# Patient Record
Sex: Male | Born: 1965 | Race: Black or African American | Hispanic: No | Marital: Single | Smoking: Never smoker
Health system: Southern US, Community
[De-identification: ages and names within clinical notes are randomized; demographics above are authoritative.]

## PROBLEM LIST (undated history)

## (undated) DIAGNOSIS — Z9889 Other specified postprocedural states: Secondary | ICD-10-CM

## (undated) DIAGNOSIS — J189 Pneumonia, unspecified organism: Secondary | ICD-10-CM

## (undated) DIAGNOSIS — I5041 Acute combined systolic (congestive) and diastolic (congestive) heart failure: Secondary | ICD-10-CM

## (undated) DIAGNOSIS — N186 End stage renal disease: Secondary | ICD-10-CM

## (undated) DIAGNOSIS — I82629 Acute embolism and thrombosis of deep veins of unspecified upper extremity: Secondary | ICD-10-CM

## (undated) DIAGNOSIS — I739 Peripheral vascular disease, unspecified: Secondary | ICD-10-CM

## (undated) DIAGNOSIS — Z992 Dependence on renal dialysis: Secondary | ICD-10-CM

## (undated) DIAGNOSIS — D735 Infarction of spleen: Secondary | ICD-10-CM

## (undated) DIAGNOSIS — I10 Essential (primary) hypertension: Secondary | ICD-10-CM

## (undated) DIAGNOSIS — I340 Nonrheumatic mitral (valve) insufficiency: Secondary | ICD-10-CM

## (undated) DIAGNOSIS — I38 Endocarditis, valve unspecified: Secondary | ICD-10-CM

## (undated) DIAGNOSIS — T826XXA Infection and inflammatory reaction due to cardiac valve prosthesis, initial encounter: Secondary | ICD-10-CM

## (undated) DIAGNOSIS — IMO0001 Reserved for inherently not codable concepts without codable children: Secondary | ICD-10-CM

## (undated) DIAGNOSIS — I5032 Chronic diastolic (congestive) heart failure: Secondary | ICD-10-CM

## (undated) DIAGNOSIS — I330 Acute and subacute infective endocarditis: Secondary | ICD-10-CM

## (undated) DIAGNOSIS — I76 Septic arterial embolism: Secondary | ICD-10-CM

## (undated) HISTORY — PX: AV FISTULA PLACEMENT: SHX1204

## (undated) HISTORY — DX: Infection and inflammatory reaction due to cardiac valve prosthesis, initial encounter: T82.6XXA

## (undated) HISTORY — DX: Endocarditis, valve unspecified: I38

## (undated) HISTORY — DX: Peripheral vascular disease, unspecified: I73.9

---

## 2002-09-22 ENCOUNTER — Encounter: Admission: RE | Admit: 2002-09-22 | Discharge: 2002-12-21 | Payer: Self-pay | Admitting: Family Medicine

## 2002-10-18 ENCOUNTER — Ambulatory Visit (HOSPITAL_COMMUNITY): Admission: RE | Admit: 2002-10-18 | Discharge: 2002-10-18 | Payer: Self-pay | Admitting: Family Medicine

## 2002-10-18 ENCOUNTER — Encounter: Payer: Self-pay | Admitting: Family Medicine

## 2003-11-29 ENCOUNTER — Inpatient Hospital Stay (HOSPITAL_COMMUNITY): Admission: EM | Admit: 2003-11-29 | Discharge: 2003-12-02 | Payer: Self-pay | Admitting: *Deleted

## 2003-12-01 ENCOUNTER — Encounter: Payer: Self-pay | Admitting: Cardiology

## 2004-01-16 ENCOUNTER — Ambulatory Visit: Admission: RE | Admit: 2004-01-16 | Discharge: 2004-01-16 | Payer: Self-pay | Admitting: Vascular Surgery

## 2004-01-25 ENCOUNTER — Ambulatory Visit (HOSPITAL_COMMUNITY): Admission: RE | Admit: 2004-01-25 | Discharge: 2004-01-25 | Payer: Self-pay | Admitting: Vascular Surgery

## 2004-02-24 ENCOUNTER — Ambulatory Visit (HOSPITAL_COMMUNITY): Admission: RE | Admit: 2004-02-24 | Discharge: 2004-02-24 | Payer: Self-pay | Admitting: Vascular Surgery

## 2004-02-29 ENCOUNTER — Ambulatory Visit (HOSPITAL_COMMUNITY): Admission: RE | Admit: 2004-02-29 | Discharge: 2004-02-29 | Payer: Self-pay | Admitting: Nephrology

## 2004-04-27 ENCOUNTER — Ambulatory Visit (HOSPITAL_COMMUNITY): Admission: RE | Admit: 2004-04-27 | Discharge: 2004-04-27 | Payer: Self-pay | Admitting: Vascular Surgery

## 2004-07-30 ENCOUNTER — Ambulatory Visit (HOSPITAL_COMMUNITY): Admission: RE | Admit: 2004-07-30 | Discharge: 2004-07-30 | Payer: Self-pay | Admitting: Nephrology

## 2008-04-10 ENCOUNTER — Emergency Department (HOSPITAL_COMMUNITY): Admission: EM | Admit: 2008-04-10 | Discharge: 2008-04-10 | Payer: Self-pay | Admitting: Emergency Medicine

## 2009-10-16 ENCOUNTER — Ambulatory Visit (HOSPITAL_COMMUNITY)
Admission: RE | Admit: 2009-10-16 | Discharge: 2009-10-16 | Payer: Self-pay | Source: Home / Self Care | Admitting: Nephrology

## 2010-11-23 NOTE — Op Note (Signed)
NAME:  BLAYN, SCARCELLA NO.:  [id number]   MEDICAL RECORD NO.:  [id number]                   PATIENT TYPE:  INP   LOCATION:  3710                                 FACILITY:  [facility]   PHYSICIAN:  Nelda Severe. Kellie Simmering, M.D.               DATE OF BIRTH:  [birth year]   DATE OF PROCEDURE:  11/30/2003  DATE OF DISCHARGE:                                 OPERATIVE REPORT   PREOPERATIVE DIAGNOSIS:  End-stage renal disease.   POSTOPERATIVE DIAGNOSIS:  End-stage renal disease.   OPERATION PERFORMED:  1. Bilateral ultrasound localization of internal jugular veins.  2. Insertion of Diatek catheter right internal jugular vein (24 cm).   SURGEON:  Akeim Refuerzo. Kellie Simmering, M.D.   ASSISTANT:  Nurse.   ANESTHESIA:  Local.   DESCRIPTION OF PROCEDURE:  The patient was taken to the operating room and  placed in Trendelenburg position at which time both internal jugular veins  were visualized using B-mode ultrasound and both noted to be patent and  normal in appearance.  After prepping and draping in routine sterile manner,  the right internal jugular vein was entered using the supraclavicular  approach, guidewire passed into the right atrium.  After dilating the tract  appropriately, the catheter was passed through a peel-away sheath and  positioned in the right atrium under fluoroscopic guidance.  The catheter  was then tunneled peripherally and secured with nylon sutures.  Both ports  easily flushed with heparin saline.  Catheter was secured with nylon sutures  and the wound closed with Vicryl in a subcuticular fashion.  Sterile  dressing applied and the patient taken to recovery room in satisfactory  condition.                                               Nelda Severe Kellie Simmering, M.D.    JDL/MEDQ  D:  11/30/2003  T:  12/01/2003  Job:  [id number]

## 2010-11-23 NOTE — Discharge Summary (Signed)
NAME:  Timothy Palmer, Timothy Palmer                          ACCOUNT NO.:  [id number]   MEDICAL RECORD NO.:  [id number]                   PATIENT TYPE:  INP   LOCATION:  3710                                 FACILITY:  [facility]   PHYSICIAN:  Caren Griffins B. Lorrene Reid, M.D.             DATE OF BIRTH:  1965-09-05   DATE OF ADMISSION:  11/29/2003  DATE OF DISCHARGE:  12/02/2003                                 DISCHARGE SUMMARY   ADMITTING DIAGNOSES:  1. Uncontrolled hypertension with evidence for multi end-organ damage.  2. Renal failure.  3. Anemia.  4. Abnormal electrocardiogram felt secondary to uncontrolled hypertension.   DISCHARGE DIAGNOSES:  1. Uncontrolled hypertension, now controlled.  2. New end-stage renal disease, now on chronic hemodialysis.  3. Status post right internal jugular Diatek catheter placement, Dr. Kellie Simmering,     Nov 30, 2003.  4. Iron deficiency anemia.  5. Secondary hyperparathyroidism.  6. Negative for acute myocardial infarction.   BRIEF HISTORY:  A 45 year old black male who has at least a one-year history  of hypertension.  Was evaluated in 2004 by Dr. Maylon Peppers and received  medication and was told that he had proteinuria.  In the [facility] computer there  is a report of an MRA of the abdomen which demonstrated three right renal  arteries that showed some irregularity and a single left renal artery which  was widely patent.  Kidney scans at that time were normal with the right one  measuring 11.3 cm and the left one measuring 12.1 cm and no hydronephrosis.   He stopped taking his medications and never followed up with his physicians.  Approximately three weeks ago he developed early morning nausea, vomiting  and a 25- to 30-pound weight loss with anorexia.  He also developed  exertional dyspnea when he walked less than a block.  He has had sleep  disturbance with extreme daytime fatigue, lethargy and some epigastric  abdominal pain.  He presents to emergency room with initial blood  pressure  of 220/160, which came down to 190/129 after a dose of oral clonidine.  His  laboratory was remarkable for a BUN of 116 and creatinine of 19.2, having  been approximately 2 in March of 2004.  His hemoglobin was 9.8, and he was  admitted for management of his renal failure and hypertension.   ADMISSION LABORATORY DATA:  Sodium 129, potassium 3.5, chloride 95, CO2 15,  BUN 116, creatinine 19.2, glucose 137, calcium 8.7, total protein 7.6,  albumin 3.8, hemoglobin 9.8, hematocrit 28.3, platelets 107,000.  White  cells shows 70% polys, 20% lymphs.  Amylase 220, lipase 99.  EKG shows  normal sinus rhythm, left atrial enlargement, incomplete right bundle branch  block, left ventricular hypertrophy with strain.   HOSPITAL COURSE:  The patient was admitted and placed on 10 mg of Norvasc  and 400 mg of labetalol b.i.d.  Echocardiogram of his heart showed overall  left ventricular systolic  function to be normal with an ejection fraction  estimated at 55-65% with mild hypokinesis of the anteroseptal wall.  A  ventricular wall thickness was moderately to markedly increased.  The left  atrial size was at the upper limits of normal.  Otherwise the study was  unremarkable.  Preparations were made for initiation of dialysis.  He viewed  the dialysis educational films.  He met with the dietician who left him  literature.  Dr. Kellie Simmering placed a right IJ Diatek catheter on Nov 30, 2003,  and he underwent his first dialysis following that surgery.  He dialyzed the  second time on Dec 01, 2003.  Outpatient arrangements were made at the [facility].  It will be Tuesday, Thursday, Saturday at 11:45  a.m.  Further workup included an intact PTH of 476 for which calcitriol was  started at 0.5 mcg IV each dialysis.  He was also placed on phosphate  binders using calcium carbonate 500 mg 2 with each meal for a phosphorous  that was 10 on admission.  We will repeat a PTH next month at the  [facility].  His hemoglobin was 9.8 with a transferrin saturation level of 22%.  For this, he will receive InFeD protocol at the [facility].  His EPO was  initially 5000 units and then increased to 10,000 units IV each dialysis.  His hepatitis B surface antigen was negative.  His dry weight is estimated  at 95 kg.  He is a good transplant candidate.  We will pursue this as an  outpatient.  Reportedly the patient has approximately 10 siblings.   Mr. Chapla has an appointment with Dr. Oneida Alar December 16, 2003, Friday, at  12:30 p.m. for evaluation of permanent access placement in his left arm.  He  did have vein mapping done while here in the hospital.   DISCHARGE MEDICATIONS:  1. Nephro-Vite vitamin 1 daily.  2. Calcium carbonate 500 mg 2 with meals.  3. Norvasc 10 mg q.p.m.  4. Labetalol 400 mg b.i.d., hold before dialysis.  5. EPO 10,000 unit IV each dialysis.  6. Calcitriol to be increased to 1.0 mcg IV each dialysis after three doses     of 0.5 mcg IV each dialysis.  7. InFeD protocol, including the test dose to be done at the [facility]     and then decreased to 50 mg IV weekly.  His dry weight is estimated at 95     kg.      Nonah Mattes, P.A.                      Elzie Rings Lorrene Reid, M.D.    RRK/MEDQ  D:  12/02/2003  T:  12/04/2003  Job:  [id number]   cc:   [place], MD  [place], [place] [address]   Candace Gallus, M.D.  [address]. [place] [address]  Fax: [phone]

## 2010-11-23 NOTE — H&P (Signed)
NAME:  WISSAM, GIGLIO NO.:  [id number]   MEDICAL RECORD NO.:  [id number]                   PATIENT TYPE:  INP   LOCATION:  [place]                                 FACILITY:  [facility]   PHYSICIAN:  Caren Griffins B. Lorrene Reid, M.D.             DATE OF BIRTH:  12/26/1965   DATE OF ADMISSION:  11/29/2003  DATE OF DISCHARGE:                                HISTORY & PHYSICAL   REASON FOR ADMISSION:  Severe hypertension and renal failure.   HISTORY OF PRESENT ILLNESS:  Mr. Blissett is a very nice 45 year old black  male with at least a one-year history of hypertension.  He was evaluated  sometime in 2004 by Dr. Maylon Peppers.  All the details of that workup are not  available but he was treated with medication for his hypertension and told  that he had proteinuria.  In the [facility] computer, there is report of an MRA of  the abdomen which demonstrated three right renal arteries that showed some  irregularity raising the question of possible FMD and a single left renal  artery which was widely patent.  Kidney sizes at that time were normal  measuring 11.3 cm on the right and 12.1 on the left with no hydronephrosis.   The patient stopped taking his medications and never followed up with his  physician.  About three weeks ago, he developed early-morning nausea and  vomiting, a 25-30 pound weight loss, and anorexia.  He has developed  exertional dyspnea when he walks less than a block.  He has had a sleep  disturbance with extreme daytime fatigue, lethargy, and some epigastric  abdominal pain.   He presented to the emergency room tonight with an initial blood pressure of  220/160 which came down to 190/129 after some oral clonidine.  His labs were  remarkable for a BUN of 116, a creatinine of 19.2, and a hemoglobin of 9.8.  He is therefore admitted for further evaluation and management of his renal  failure and hypertension.   PAST MEDICAL HISTORY:  Limited to hypertension on no  treatment for over a  year and proteinuria which apparently was never worked up due to the  patient's failure to followup.   He has no known drug allergies and takes no medications except Tylenol.   FAMILY HISTORY:  Positive for hypertension in his sister, lupus in his  mother who is now deceased, and cancer in his father who is deceased.  There  is no history of renal failure.  He does have a total of 10 living siblings.  One is deceased secondary to a homicide.   SOCIAL HISTORY:  He is divorced from his wife, has three sons.  He has a  high school education.  He works as a Freight forwarder at [employer].  He previously drank beer on the weekends but has had no alcohol for about  three months.  He does  not use tobacco.  No street drugs and has no risk  factors for HIV.   REVIEW OF SYSTEMS:  Positive for headaches, blurred vision, early morning  nausea, vomiting, anorexia, a 25-pound weight loss, epigastric abdominal  pain, leg cramping and cramps in his back.  He denies dysuria, hematuria, or  voiding problems but does report nocturia x2-3.  He denies edema.   PHYSICAL EXAMINATION:  GENERAL:  He is a very nice, soft spoken, young  gentleman.  VITAL SIGNS:  Blood pressure 190/126, heart rate 96, he is afebrile.  HEENT:  Fundi show severe arteriolar narrowing with AV nicking but no  papilledema.  NECK:  He had no JVD and no carotid bruits.  LUNGS:  Clear.  CARDIOVASCULAR:  Precordium was prominent.  PMI laterally displaced and  quite forceful.  S1, S2, positive S4, question soft S3.  There was no  pericardial rub.  ABDOMEN:  Obese.  Bowel sounds were present.  There were no abdominal  bruits.  He was nontender to deep palpation.  There was no palpable  organomegaly.  EXTREMITIES:  Femoral pulses were 2+ and symmetric without bruits.  Extremities showed 1+ edema.  Dorsalis pedis and posterior tibial pulses  were symmetric and 1-2+.   LABORATORY DATA:  Sodium 129, potassium  3.5, chloride 95, CO2 15, BUN 116,  creatinine 19.2, glucose 137, calcium 8.7, total protein 7.6, albumin 3.9.  Hemoglobin 9.8, hematocrit 28.3, platelets 107,000.  White cell  differential:  70% polys and 20% lymphs.  Amylase 220, lipase 99.   Chest x-ray is pending.  Ultrasound is pending.  Electrocardiogram showed  normal sinus rhythm, left atrial enlargement, incomplete right bundle branch  block, LVH was strained and it was not possible to exclude inferolateral  ischemia.   IMPRESSION:  A 45 year old black male with:  1. Uncontrolled hypertension with evidence for multi end-organ damage.     Whether or not his hypertension was all primary or whether secondary to     some underlying renal disease (history of proteinuria) or due to FMD is     uncertain at this point.  2. Renal failure.  I suspect that this is end-stage disease at this point.     His labs and symptom complex suggests chronicity.  He does have     indications for dialysis but not urgent this evening.  Whether this is     all related to uncontrolled hypertension or some underlying glomerular     disease associated with hypertension is unclear as mentioned in #1;     however, at this point I suspect little in the way of reversibility.  3. Anemia, likely secondary to #2.  4. Abnormal electrocardiogram, likely secondary to #1.   PLANS:  1. Control hypertension--he has received clonidine in the emergency room as     well as IV labetalol with some response.  We will start p.o. labetalol     and p.o. Norvasc and use IV labetalol as needed.  2. Obtain a renal ultrasound to reevaluate kidney size and symmetry and     obtain SPEP and UPEP for completeness.  His mother had lupus but he has     nothing to suggest lupus-related renal disease at this point.  I have     called Dr. Amedeo Plenty for catheter placement for dialysis.  We will order vein    mapping and save his left arm, show him dialysis videos, and begin     chronic kidney  disease education.  We  will need to check a biointact PTH     as well as phosphorous and initiate binders and/or Calcitriol if     appropriate.  3. Obtain iron studies and initiate erythropoietin with his dialysis.  4. A 2-D echocardiogram to look at wall motion, evaluate for severity of     left ventricular hypertrophy, and look at left ventricular function.                                                Elzie Rings Lorrene Reid, M.D.    CBD/MEDQ  D:  11/29/2003  T:  11/30/2003  Job:  [id number]

## 2010-11-23 NOTE — Op Note (Signed)
NAME:  Timothy Palmer, Timothy Palmer NO.:  [id number]   MEDICAL RECORD NO.:  [id number]                   PATIENT TYPE:  OIB   LOCATION:  2899                                 FACILITY:  [facility]   PHYSICIAN:  Nelda Severe. Kellie Simmering, M.D.               DATE OF BIRTH:  [birth year]   DATE OF PROCEDURE:  DATE OF DISCHARGE:  01/25/2004                                 OPERATIVE REPORT   DATE OF OPERATION:  January 25, 2004.   PREOPERATIVE DIAGNOSIS:  End-stage renal disease.   POSTOPERATIVE DIAGNOSIS:  End-stage renal disease.   OPERATION:  Creation of a left radial artery to cephalic vein AV fistula  (Cimino shunt).   SURGEON:  Holdon Cordner. Kellie Simmering, MD.   FIRST ASSISTANT:  Nurse.   ANESTHESIA:  Local.   PROCEDURE:  The patient was taken to the operating room, placed in the  supine position, at which time the left upper extremity was prepped with  Betadine scrub and solution, and draped in the routine sterile manner. After  infiltration with 1% Xylocaine with epinephrine, a longitudinal incision was  made just proximal to the wrist between the radial artery and cephalic vein.  The cephalic vein was dissected free, ligated distally, and transected.  It  was gently dilated with heparinized saline and was a good vein up to the  antecubital area.  It was satisfactory for a fistula.  It was about 3 mm in  size.  The radial artery was then exposed beneath the fascia, encircled with  vessel loops, and 3000 units of heparin given intravenously.  The artery was  then opened longitudinally with a #15 blade, extended with a Potts scissors.  It would accept a 2.5 mm dilator proximally and distally.  The vein was  carefully measured and spatulated, and anastomosed end-to-side with 6-0  Prolene.  The clamp was then released, and there was a good pulse and thrill  up to the antecubital area.  No Protamine was given.  The wound was  irrigated with saline, closed in layers with Vicryl in a  subcuticular  fashion, sterile dressing applied, and the patient taken to the recovery  room in satisfactory condition.                                               Nelda Severe Kellie Simmering, M.D.    JDL/MEDQ  D:  01/25/2004  T:  01/25/2004  Job:  [id number]

## 2011-04-08 LAB — PATHOLOGIST SMEAR REVIEW

## 2011-04-08 LAB — POCT I-STAT, CHEM 8
Creatinine, Ser: 8.2 — ABNORMAL HIGH
Hemoglobin: 11.6 — ABNORMAL LOW
Potassium: 3.1 — ABNORMAL LOW
TCO2: 25

## 2011-04-08 LAB — BASIC METABOLIC PANEL
BUN: 29 — ABNORMAL HIGH
Calcium: 9
Chloride: 100
Creatinine, Ser: 8.78 — ABNORMAL HIGH
GFR calc Af Amer: 8 — ABNORMAL LOW
Potassium: 3.1 — ABNORMAL LOW

## 2011-04-08 LAB — DIFFERENTIAL
Basophils Absolute: 0
Basophils Relative: 0
Lymphocytes Relative: 45
Lymphs Abs: 6.4 — ABNORMAL HIGH
Neutrophils Relative %: 49

## 2011-04-08 LAB — CBC
HCT: 33.3 — ABNORMAL LOW
MCHC: 33.1
RDW: 15.4

## 2011-10-10 ENCOUNTER — Other Ambulatory Visit (HOSPITAL_COMMUNITY): Payer: Self-pay | Admitting: Nephrology

## 2011-10-10 DIAGNOSIS — N186 End stage renal disease: Secondary | ICD-10-CM

## 2011-10-11 ENCOUNTER — Ambulatory Visit (HOSPITAL_COMMUNITY): Payer: Self-pay

## 2011-10-14 ENCOUNTER — Ambulatory Visit (HOSPITAL_COMMUNITY): Admission: RE | Admit: 2011-10-14 | Payer: Self-pay | Source: Ambulatory Visit

## 2011-10-29 ENCOUNTER — Other Ambulatory Visit (HOSPITAL_COMMUNITY): Payer: Self-pay | Admitting: Nephrology

## 2011-10-29 DIAGNOSIS — N186 End stage renal disease: Secondary | ICD-10-CM

## 2011-11-01 ENCOUNTER — Ambulatory Visit (HOSPITAL_COMMUNITY): Payer: Self-pay

## 2011-11-04 ENCOUNTER — Ambulatory Visit (HOSPITAL_COMMUNITY)
Admission: RE | Admit: 2011-11-04 | Discharge: 2011-11-04 | Disposition: A | Discharge status: Home / Self Care | Payer: Medicare Other | Source: Ambulatory Visit | Attending: Nephrology | Admitting: Nephrology

## 2011-11-04 DIAGNOSIS — Y849 Medical procedure, unspecified as the cause of abnormal reaction of the patient, or of later complication, without mention of misadventure at the time of the procedure: Secondary | ICD-10-CM | POA: Insufficient documentation

## 2011-11-04 DIAGNOSIS — N186 End stage renal disease: Secondary | ICD-10-CM

## 2011-11-04 DIAGNOSIS — T82898A Other specified complication of vascular prosthetic devices, implants and grafts, initial encounter: Secondary | ICD-10-CM | POA: Insufficient documentation

## 2011-11-04 HISTORY — PX: SHUNTOGRAM: SHX6095

## 2011-11-04 MED ORDER — IOHEXOL 300 MG/ML  SOLN
100.0000 mL | Freq: Once | INTRAMUSCULAR | Status: AC | PRN
  Administered 2011-11-04: 50 mL via INTRAVENOUS

## 2011-11-04 NOTE — Procedures (Signed)
Procedure:  Left fistulogram Findings:  Stable patency.  No intervention necessary.

## 2011-11-05 ENCOUNTER — Telehealth (HOSPITAL_COMMUNITY): Payer: Self-pay | Admitting: *Deleted

## 2011-11-05 NOTE — Telephone Encounter (Signed)
Post procedure follow up call. VM left for pt

## 2012-07-08 DIAGNOSIS — J189 Pneumonia, unspecified organism: Secondary | ICD-10-CM

## 2012-07-08 HISTORY — DX: Pneumonia, unspecified organism: J18.9

## 2012-08-13 ENCOUNTER — Other Ambulatory Visit (HOSPITAL_COMMUNITY): Payer: Self-pay | Admitting: Nephrology

## 2012-08-13 DIAGNOSIS — N186 End stage renal disease: Secondary | ICD-10-CM

## 2012-08-14 ENCOUNTER — Ambulatory Visit (HOSPITAL_COMMUNITY): Admission: RE | Admit: 2012-08-14 | Payer: Medicare Other | Source: Ambulatory Visit

## 2012-08-17 ENCOUNTER — Encounter (HOSPITAL_COMMUNITY): Payer: Self-pay

## 2012-08-17 ENCOUNTER — Other Ambulatory Visit (HOSPITAL_COMMUNITY): Payer: Self-pay | Admitting: Nephrology

## 2012-08-17 ENCOUNTER — Ambulatory Visit (HOSPITAL_COMMUNITY)
Admission: RE | Admit: 2012-08-17 | Discharge: 2012-08-17 | Disposition: A | Discharge status: Home / Self Care | Payer: Medicare Other | Source: Ambulatory Visit | Attending: Nephrology | Admitting: Nephrology

## 2012-08-17 DIAGNOSIS — I70209 Unspecified atherosclerosis of native arteries of extremities, unspecified extremity: Secondary | ICD-10-CM | POA: Insufficient documentation

## 2012-08-17 DIAGNOSIS — T82898A Other specified complication of vascular prosthetic devices, implants and grafts, initial encounter: Secondary | ICD-10-CM | POA: Insufficient documentation

## 2012-08-17 DIAGNOSIS — N186 End stage renal disease: Secondary | ICD-10-CM

## 2012-08-17 DIAGNOSIS — Y832 Surgical operation with anastomosis, bypass or graft as the cause of abnormal reaction of the patient, or of later complication, without mention of misadventure at the time of the procedure: Secondary | ICD-10-CM | POA: Insufficient documentation

## 2012-08-17 DIAGNOSIS — I871 Compression of vein: Secondary | ICD-10-CM | POA: Insufficient documentation

## 2012-08-17 DIAGNOSIS — Z992 Dependence on renal dialysis: Secondary | ICD-10-CM | POA: Insufficient documentation

## 2012-08-17 MED ORDER — IOHEXOL 300 MG/ML  SOLN
100.0000 mL | Freq: Once | INTRAMUSCULAR | Status: AC | PRN
  Administered 2012-08-17: 60 mL via INTRAVENOUS

## 2012-08-17 NOTE — H&P (Signed)
  HPI: Timothy Palmer is an 47 y.o. male with ESRD who has been having trouble with access flows during his HD sessions. He is referred for a fistulogram, which is now noted to be positive for a stenosis. PMHx and meds reviewed.  Past Medical History: ESRD  Past Surgical History: (L)forearm AVF  Allergies: No Known Allergies  Medications: Nephrovite, Levitra, Pravastatin, Lisinopril  Please HPI for pertinent positives, otherwise complete 10 system ROS negative.  Physical Exam: There were no vitals taken for this visit. There is no height or weight on file to calculate BMI.   General Appearance:  Alert, cooperative, no distress, appears stated age  Head:  Normocephalic, without obvious abnormality, atraumatic  ENT: Unremarkable  Lungs:   Clear to auscultation bilaterally, no w/r/r, respirations unlabored without use of accessory muscles.  Heart:  Regular rate and rhythm, S1, S2 normal, no murmur, rub or gallop. Carotids 2+ without bruit.  Extremities: (L)forearm AVF currently accessed for shuntogram. Hand warm  Neurologic: Normal affect, no gross deficits.   No results found for this or any previous visit (from the past 48 hour(s)). No results found.  Assessment/Plan ESRD Stenosis of (L)UE AVF For completion shuntogram and angioplasty Discussed procedure with pt, including risks and complications. Consent signed  Ascencion Dike PA-C 08/17/2012, 3:27 PM

## 2012-08-18 ENCOUNTER — Telehealth (HOSPITAL_COMMUNITY): Payer: Self-pay | Admitting: *Deleted

## 2012-09-12 ENCOUNTER — Emergency Department (HOSPITAL_COMMUNITY): Payer: Medicare Other

## 2012-09-12 ENCOUNTER — Encounter (HOSPITAL_COMMUNITY): Payer: Self-pay | Admitting: Emergency Medicine

## 2012-09-12 ENCOUNTER — Emergency Department (HOSPITAL_COMMUNITY)
Admission: EM | Admit: 2012-09-12 | Discharge: 2012-09-12 | Disposition: A | Discharge status: Home / Self Care | Payer: Medicare Other | Attending: Emergency Medicine | Admitting: Emergency Medicine

## 2012-09-12 DIAGNOSIS — J189 Pneumonia, unspecified organism: Secondary | ICD-10-CM

## 2012-09-12 DIAGNOSIS — N186 End stage renal disease: Secondary | ICD-10-CM | POA: Insufficient documentation

## 2012-09-12 DIAGNOSIS — I120 Hypertensive chronic kidney disease with stage 5 chronic kidney disease or end stage renal disease: Secondary | ICD-10-CM | POA: Insufficient documentation

## 2012-09-12 DIAGNOSIS — M545 Low back pain, unspecified: Secondary | ICD-10-CM | POA: Insufficient documentation

## 2012-09-12 DIAGNOSIS — J159 Unspecified bacterial pneumonia: Secondary | ICD-10-CM | POA: Insufficient documentation

## 2012-09-12 DIAGNOSIS — Z79899 Other long term (current) drug therapy: Secondary | ICD-10-CM | POA: Insufficient documentation

## 2012-09-12 DIAGNOSIS — Z992 Dependence on renal dialysis: Secondary | ICD-10-CM | POA: Insufficient documentation

## 2012-09-12 HISTORY — DX: Essential (primary) hypertension: I10

## 2012-09-12 LAB — CBC WITH DIFFERENTIAL/PLATELET
Basophils Absolute: 0 10*3/uL (ref 0.0–0.1)
Basophils Relative: 0 % (ref 0–1)
Eosinophils Absolute: 0.1 10*3/uL (ref 0.0–0.7)
Eosinophils Relative: 1 % (ref 0–5)
HCT: 33.6 % — ABNORMAL LOW (ref 39.0–52.0)
Lymphocytes Relative: 21 % (ref 12–46)
Lymphs Abs: 2.4 10*3/uL (ref 0.7–4.0)
MCH: 33.1 pg (ref 26.0–34.0)
MCV: 99.4 fL (ref 78.0–100.0)
Neutro Abs: 8.1 10*3/uL — ABNORMAL HIGH (ref 1.7–7.7)
RDW: 14.2 % (ref 11.5–15.5)

## 2012-09-12 LAB — POCT I-STAT TROPONIN I

## 2012-09-12 LAB — COMPREHENSIVE METABOLIC PANEL
ALT: 14 U/L (ref 0–53)
AST: 14 U/L (ref 0–37)
Alkaline Phosphatase: 140 U/L — ABNORMAL HIGH (ref 39–117)
GFR calc non Af Amer: 5 mL/min — ABNORMAL LOW (ref >90)
Glucose, Bld: 126 mg/dL — ABNORMAL HIGH (ref 70–99)
Sodium: 137 mEq/L (ref 135–145)
Total Bilirubin: 0.6 mg/dL (ref 0.3–1.2)

## 2012-09-12 MED ORDER — MORPHINE SULFATE 4 MG/ML IJ SOLN
4.0000 mg | Freq: Once | INTRAMUSCULAR | Status: AC
  Administered 2012-09-12: 4 mg via INTRAVENOUS
  Filled 2012-09-12: qty 1

## 2012-09-12 MED ORDER — IOHEXOL 350 MG/ML SOLN
100.0000 mL | Freq: Once | INTRAVENOUS | Status: AC | PRN
  Administered 2012-09-12: 100 mL via INTRAVENOUS

## 2012-09-12 MED ORDER — MOXIFLOXACIN HCL 400 MG PO TABS: 400.0000 mg | ORAL_TABLET | Freq: Every day | ORAL | Status: DC

## 2012-09-12 MED ORDER — OXYCODONE-ACETAMINOPHEN 5-325 MG PO TABS: 2.0000 | ORAL_TABLET | ORAL | Status: DC | PRN

## 2012-09-12 MED ORDER — LEVOFLOXACIN 500 MG PO TABS
500.0000 mg | ORAL_TABLET | Freq: Once | ORAL | Status: AC
  Administered 2012-09-12: 500 mg via ORAL
  Filled 2012-09-12: qty 1

## 2012-09-12 NOTE — ED Notes (Signed)
Onset today shortness of breath and entire left side of back.  Pain currently 9/10 achy. Has dialysis t, th, and sat.  Completed full session today 6am-10am.

## 2012-09-12 NOTE — ED Provider Notes (Signed)
History     CSN: [id number]  Arrival date & time 09/12/12  1412   First MD Initiated Contact with Patient 09/12/12 1541      Chief Complaint  Patient presents with  . Shortness of Breath  . Back Pain    (Consider location/radiation/quality/duration/timing/severity/associated sxs/prior treatment) HPI Comments: Patient complains of left-sided back pain. He states she woke up at 4 AM this morning with sharp pain to his left shoulder blade radiating to his left mid and lower back. He states it's worse with inspiration and worse with movement. He denies any known injury. He's been feeling more short of breath since this pain started. He denies any leg pain or swelling. He denies any cough or chest congestion. Denies any fevers or chills. He denies a known history of blood clots. He states it's been worsening through today. He has a history of hypertension and end-stage renal disease related to hypertension. He has dialysis Tuesday Thursday Saturday and did complete his full dialysis session this morning.  Patient is a 47 y.o. male presenting with shortness of breath and back pain.  Shortness of Breath Associated symptoms: no abdominal pain, no chest pain, no cough, no diaphoresis, no fever, no headaches, no rash and no vomiting   Back Pain Associated symptoms: no abdominal pain, no chest pain, no fever, no headaches, no numbness and no weakness     Past Medical History  Diagnosis Date  . Renal disorder   . Hypertension     History reviewed. No pertinent past surgical history.  No family history on file.  History  Substance Use Topics  . Smoking status: Never Smoker   . Smokeless tobacco: Not on file  . Alcohol Use: Yes      Review of Systems  Constitutional: Negative for fever, chills, diaphoresis and fatigue.  HENT: Negative for congestion, rhinorrhea and sneezing.   Eyes: Negative.   Respiratory: Positive for shortness of breath. Negative for cough and chest tightness.    Cardiovascular: Negative for chest pain and leg swelling.  Gastrointestinal: Negative for nausea, vomiting, abdominal pain, diarrhea and blood in stool.  Genitourinary: Negative for frequency, hematuria, flank pain and difficulty urinating.  Musculoskeletal: Positive for back pain. Negative for arthralgias.  Skin: Negative for rash.  Neurological: Negative for dizziness, speech difficulty, weakness, numbness and headaches.    Allergies  Review of patient's allergies indicates no known allergies.  Home Medications   Current Outpatient Rx  Name  Route  Sig  Dispense  Refill  . CINACALCET HCL PO   Oral   Take by mouth.         Marland Kitchen LISINOPRIL PO   Oral   Take by mouth.         . Sevelamer HCl (RENAGEL PO)   Oral   Take by mouth.         . moxifloxacin (AVELOX) 400 MG tablet   Oral   Take 1 tablet (400 mg total) by mouth daily.   10 tablet   0   . oxyCODONE-acetaminophen (PERCOCET) 5-325 MG per tablet   Oral   Take 2 tablets by mouth every 4 (four) hours as needed for pain.   15 tablet   0     BP 122/76  Pulse 101  Temp(Src) 98.2 F (36.8 C) (Oral)  Resp 26  Ht 5\' 11"  (1.803 m)  Wt 248 lb (112.492 kg)  BMI 34.6 kg/m2  SpO2 99%  Physical Exam  Constitutional: He is oriented to person, place,  and time. He appears well-developed and well-nourished.  HENT:  Head: Normocephalic and atraumatic.  Eyes: Pupils are equal, round, and reactive to light.  Neck: Normal range of motion. Neck supple.  Cardiovascular: Normal rate, regular rhythm and normal heart sounds.   Pulmonary/Chest: Effort normal and breath sounds normal. No respiratory distress. He has no wheezes. He has no rales. He exhibits no tenderness.  Abdominal: Soft. Bowel sounds are normal. There is no tenderness. There is no rebound and no guarding.  Musculoskeletal: Normal range of motion. He exhibits no edema.  No reproducible pain to the left upper back. No pedal edema. No pain on palpation of the  posterior calves  Lymphadenopathy:    He has no cervical adenopathy.  Neurological: He is alert and oriented to person, place, and time.  Skin: Skin is warm and dry. No rash noted.  Psychiatric: He has a normal mood and affect.    ED Course  Procedures (including critical care time)  Results for orders placed during the hospital encounter of 09/12/12  CBC WITH DIFFERENTIAL      Result Value Range   WBC 11.7 (*) 4.0 - 10.5 K/uL   RBC 3.38 (*) 4.22 - 5.81 MIL/uL   Hemoglobin 11.2 (*) 13.0 - 17.0 g/dL   HCT 33.6 (*) 39.0 - 52.0 %   MCV 99.4  78.0 - 100.0 fL   MCH 33.1  26.0 - 34.0 pg   MCHC 33.3  30.0 - 36.0 g/dL   RDW 14.2  11.5 - 15.5 %   Platelets 186  150 - 400 K/uL   Neutrophils Relative 69  43 - 77 %   Neutro Abs 8.1 (*) 1.7 - 7.7 K/uL   Lymphocytes Relative 21  12 - 46 %   Lymphs Abs 2.4  0.7 - 4.0 K/uL   Monocytes Relative 9  3 - 12 %   Monocytes Absolute 1.1 (*) 0.1 - 1.0 K/uL   Eosinophils Relative 1  0 - 5 %   Eosinophils Absolute 0.1  0.0 - 0.7 K/uL   Basophils Relative 0  0 - 1 %   Basophils Absolute 0.0  0.0 - 0.1 K/uL  COMPREHENSIVE METABOLIC PANEL      Result Value Range   Sodium 137  135 - 145 mEq/L   Potassium 5.0  3.5 - 5.1 mEq/L   Chloride 95 (*) 96 - 112 mEq/L   CO2 29  19 - 32 mEq/L   Glucose, Bld 126 (*) 70 - 99 mg/dL   BUN 28 (*) 6 - 23 mg/dL   Creatinine, Ser 10.33 (*) 0.50 - 1.35 mg/dL   Calcium 10.1  8.4 - 10.5 mg/dL   Total Protein 8.9 (*) 6.0 - 8.3 g/dL   Albumin 3.9  3.5 - 5.2 g/dL   AST 14  0 - 37 U/L   ALT 14  0 - 53 U/L   Alkaline Phosphatase 140 (*) 39 - 117 U/L   Total Bilirubin 0.6  0.3 - 1.2 mg/dL   GFR calc non Af Amer 5 (*) >90 mL/min   GFR calc Af Amer 6 (*) >90 mL/min  POCT I-STAT TROPONIN I      Result Value Range   Troponin i, poc 0.00  0.00 - 0.08 ng/mL   Comment 3            Dg Chest 2 View  09/12/2012  *RADIOLOGY REPORT*  Clinical Data: Shortness of breath, back pain, cough  CHEST - 2 VIEW  Comparison: None.  Findings:  Mild left lower lobe opacity, atelectasis versus pneumonia.  No pleural effusion or pneumothorax.  Cardiomegaly.  Visualized osseous structures are within normal limits.  IMPRESSION: Mild left lower lobe opacity, atelectasis versus pneumonia.   Original Report Authenticated By: Julian Hy, M.D.    Ct Angio Chest Pe W/cm &/or Wo Cm  09/12/2012  *RADIOLOGY REPORT*  Clinical Data: Left-sided posterior chest pain.  CT ANGIOGRAPHY CHEST  Technique:  Multidetector CT imaging of the chest using the standard protocol during bolus administration of intravenous contrast. Multiplanar reconstructed images including MIPs were obtained and reviewed to evaluate the vascular anatomy.  Contrast: 18mL OMNIPAQUE IOHEXOL 350 MG/ML SOLN  Comparison: CTA of the chest performed earlier today at 05:10 p.m.  Findings: There is no evidence of significant pulmonary embolus.  A trace left pleural effusion is noted.  Patchy bilateral airspace opacities raise concern for pneumonia, most prominent in the lower lung lobes bilaterally.  There is no evidence of pleural effusion or pneumothorax.  No masses are identified; no abnormal focal contrast enhancement is seen.  A dense pleural calcific plaque is again noted at the right lung base.  Mildly enlarged subcarinal and periaortic nodes are again seen, measuring up to 1.2 cm in short axis.  The mediastinum is otherwise unremarkable in appearance.  No pericardial effusion is identified. The great vessels are grossly unremarkable.  No axillary lymphadenopathy is seen.  The visualized portions of the thyroid gland are unremarkable in appearance.  The visualized portions of the liver and spleen are unremarkable.  No acute osseous abnormalities are seen.  IMPRESSION:  1.  No evidence of significant pulmonary embolus. 2.  Trace left pleural effusion; patchy bilateral airspace opacities raise concern for pneumonia, most prominent in the lower lung lobes bilaterally. 3.  Dense pleural calcific  plaque noted at the right lung base, raising question for prior asbestos exposure. 4.  Mildly enlarged mediastinal nodes, measuring up to 1.2 cm in short axis.   Original Report Authenticated By: Santa Lighter, M.D.    Ct Angio Chest Pe W/cm &/or Wo Cm  09/12/2012  *RADIOLOGY REPORT*  Clinical Data: Left-sided chest and back pain.  Shortness of breath.  Hypertension.  CT ANGIOGRAPHY CHEST  Technique:  Multidetector CT imaging of the chest using the standard protocol during bolus administration of intravenous contrast. Multiplanar reconstructed images including MIPs were obtained and reviewed to evaluate the vascular anatomy.  Contrast: 146mL OMNIPAQUE IOHEXOL 350 MG/ML SOLN  Comparison: Plain film of earlier today.  No prior CT.  Findings: Lung windows demonstrate multifactorial degradation. Addition to the patient's size, right arm is not raised above the head.  Mosaic attenuation, favored to be related to patchy areas of volume loss and low lung volumes.  Right base dependent atelectasis. Dependent patchy left lower lobe superior segment collapse / consolidative change.  Soft tissue windows:  The quality of this exam for evaluation of pulmonary embolism is poor to moderate.  In addition to the above described factors, the bolus is poorly timed, centered in the SVC. No central pulmonary embolism.  No lobar embolism.  To lung bases, no large segmental embolism identified.  Bilateral moderate gynecomastia.  Tortuous descending thoracic aorta.  Heart size upper normal.  Trace left pleural fluid.  Right- sided pleural calcification on image 57/series 3.  Borderline enlarged high right paratracheal node 1.0 cm on image 18/series 6. No hilar adenopathy.  Prevascular nodes which measure up to 1.2 cm. Moderate right hemidiaphragm elevation.  Limited abdominal imaging demonstrates no  significant findings.  No acute osseous abnormality.  IMPRESSION:  1.  Multifactorial degradation.  Poor to moderate evaluation for  pulmonary embolism.  No large/central embolism identified. 2.  Patchy superior segment left lower lobe airspace disease with small left pleural effusion.  Favored to represent infection. Small volume infarct could look similar.  If high clinical concern, consider repeat CT versus VQ scan. 3.  Mild thoracic adenopathy, favored to be reactive.  Consider follow-up with chest CT 3 - 6 months to confirm stability or resolution.  4.  Bilateral gynecomastia. 5.  Right-sided pleural calcification, suggesting prior empyema or hemothorax.   Original Report Authenticated By: Abigail Miyamoto, M.D.      Date: 09/12/2012  Rate: 105  Rhythm: sinus tachycardia  QRS Axis: right  Intervals: normal  ST/T Wave abnormalities: normal  Conduction Disutrbances:none  Narrative Interpretation:   Old EKG Reviewed: none available   1. Community acquired pneumonia       MDM  No evidence of PE, possible pneumonia, will tx with abx.  Pt well appearing, feel that outpatient treatment is appropriate.  Advised will need f/u for pleural plaques.  Pt is afebrile with mild cough and back pain.  Will CT findings, I feel that it is appropriate to treat for pneumonia, but given the pt's symptoms, I feel that it is most appropriate to tx for CAP vs HCAP at this time.  Advised f/u with his PMD.        Malvin Johns, MD 09/12/12 2150

## 2013-06-29 ENCOUNTER — Encounter: Payer: Self-pay | Admitting: Vascular Surgery

## 2013-06-30 ENCOUNTER — Ambulatory Visit: Payer: Medicare Other | Admitting: Vascular Surgery

## 2013-06-30 ENCOUNTER — Encounter: Payer: Self-pay | Admitting: Vascular Surgery

## 2013-06-30 ENCOUNTER — Ambulatory Visit (INDEPENDENT_AMBULATORY_CARE_PROVIDER_SITE_OTHER): Payer: Medicare Other | Admitting: Vascular Surgery

## 2013-06-30 VITALS — BP 145/99 | HR 89 | Resp 16 | Ht 71.5 in | Wt 241.0 lb

## 2013-06-30 DIAGNOSIS — N186 End stage renal disease: Secondary | ICD-10-CM

## 2013-06-30 NOTE — Progress Notes (Signed)
[facility]  Patient name: Timothy Palmer MRN: [id number] DOB: Apr 21, 1966 Sex: male  REASON FOR CONSULT: To evaluate for revision of proximal left radiocephalic AV fistula. Referred by Dr. Otelia Santee.  HPI: Timothy Palmer is a 47 y.o. male who had a left radiocephalic fistula placed approximately 10 years ago. He has been using this for dialysis. He dialyzes on Tuesdays Thursdays and Saturdays. He most recently had a fistulogram by Dr. Augustin Coupe on 06/23/2013. 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 He had a fistulogram by interventional radiology in February of 2014. In reviewing those films, it appears to me that the fistula is being filled by retrograde flow in the radial artery via the palmar arch. There was a stenosis in the proximal fistula which was ballooned. This required very high pressures.  On his most recent fistulogram on 06/23/2013, reportedly this showed a 70% stenosis in the radial artery feeding the fistula. It was not felt that this could be addressed with angioplasty and he was sent for evaluation for possible patch angioplasty of the proximal fistula and radial artery. I do not have these films to review. Patient denies any specific problems with the fistula or pain in his left arm.   Past Medical History  Diagnosis Date  . Renal disorder   . Hypertension   . Peripheral vascular disease    Family History  Problem Relation Age of Onset  . Diabetes Mother   . Hypertension Mother   . Hypertension Father   . Hypertension Sister   . Hypertension Brother    SOCIAL HISTORY: History  Substance Use Topics  . Smoking status: Never Smoker   . Smokeless tobacco: Never Used  . Alcohol Use: Yes   No Known Allergies Current Outpatient Prescriptions  Medication Sig Dispense Refill  . LISINOPRIL PO Take by mouth.      . moxifloxacin (AVELOX) 400 MG tablet Take 1 tablet (400 mg total) by mouth daily.  10  tablet  0  . CINACALCET HCL PO Take by mouth.      . oxyCODONE-acetaminophen (PERCOCET) 5-325 MG per tablet Take 2 tablets by mouth every 4 (four) hours as needed for pain.  15 tablet  0  . Sevelamer HCl (RENAGEL PO) Take by mouth.       No current facility-administered medications for this visit.   REVIEW OF SYSTEMS: Valu.Nieves ] denotes positive finding; [  ] denotes negative finding  CARDIOVASCULAR:  [ ]  chest pain   [ ]  chest pressure   [ ]  palpitations   [ ]  orthopnea   [ ]  dyspnea on exertion   [ ]  claudication   [ ]  rest pain   [ ]  DVT   [ ]  phlebitis PULMONARY:   [ ]  productive cough   [ ]  asthma   [ ]  wheezing NEUROLOGIC:   [ ]  weakness  [ ]  paresthesias  [ ]  aphasia  [ ]  amaurosis  [ ]  dizziness HEMATOLOGIC:   [ ]  bleeding problems   [ ]  clotting disorders MUSCULOSKELETAL:  [ ]  joint pain   [ ]  joint swelling [ ]  leg swelling GASTROINTESTINAL: [ ]   blood in stool  [ ]   hematemesis GENITOURINARY:  [ ]   dysuria  [ ]   hematuria PSYCHIATRIC:  [ ]  history of major depression INTEGUMENTARY:  [ ]  rashes  [ ]  ulcers CONSTITUTIONAL:  [ ]  fever   [ ]  chills  PHYSICAL EXAM:  Filed Vitals:   06/30/13 1524  BP: 145/99  Pulse: 89  Resp: 16  Height: 5' 11.5" (1.816 m)  Weight: 241 lb (109.317 kg)  SpO2: 97%   Body mass index is 33.15 kg/(m^2). GENERAL: The patient is a well-nourished male, in no acute distress. The vital signs are documented above. CARDIOVASCULAR: There is a regular rate and rhythm.  PULMONARY: There is good air exchange bilaterally without wheezing or rales. 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  DATA:  I have reviewed his note from his previous fistulogram as described above.  MEDICAL ISSUES: I will try to obtain the films from Dr. Augustin Coupe and have scheduled him for patch angioplasty of the proximal fistula on 07/23/2012. 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 However, I agree that it is worth a try to improve the inflow to the fistula and hopefully get some more time out of this left radiocephalic AV fistula.  [place] [facility] [place] Beeper: [phone]

## 2013-07-10 DIAGNOSIS — N186 End stage renal disease: Secondary | ICD-10-CM | POA: Diagnosis not present

## 2013-07-10 DIAGNOSIS — N2581 Secondary hyperparathyroidism of renal origin: Secondary | ICD-10-CM | POA: Diagnosis not present

## 2013-07-10 DIAGNOSIS — D509 Iron deficiency anemia, unspecified: Secondary | ICD-10-CM | POA: Diagnosis not present

## 2013-07-10 DIAGNOSIS — D631 Anemia in chronic kidney disease: Secondary | ICD-10-CM | POA: Diagnosis not present

## 2013-07-10 DIAGNOSIS — Z992 Dependence on renal dialysis: Secondary | ICD-10-CM | POA: Diagnosis not present

## 2013-07-14 ENCOUNTER — Other Ambulatory Visit: Payer: Self-pay

## 2013-07-21 ENCOUNTER — Other Ambulatory Visit: Payer: Self-pay

## 2013-07-22 MED ORDER — DEXTROSE 5 % IV SOLN
1.5000 g | INTRAVENOUS | Status: AC
  Administered 2013-07-23: 1.5 g via INTRAVENOUS
  Filled 2013-07-22: qty 1.5

## 2013-07-22 MED ORDER — SODIUM CHLORIDE 0.9 % IV SOLN
INTRAVENOUS | Status: DC
  Administered 2013-07-23: 08:00:00 via INTRAVENOUS

## 2013-07-22 NOTE — Progress Notes (Signed)
Several unsuccessful attempts were made to contact pt for SDW Call; lvm at # [phone] with pre-op instructions according to pre-op checklist.

## 2013-07-23 ENCOUNTER — Encounter (HOSPITAL_COMMUNITY)
Admission: RE | Disposition: A | Discharge status: Home / Self Care | Payer: Self-pay | Source: Ambulatory Visit | Attending: Vascular Surgery

## 2013-07-23 ENCOUNTER — Ambulatory Visit (HOSPITAL_COMMUNITY): Payer: Medicare Other

## 2013-07-23 ENCOUNTER — Ambulatory Visit (HOSPITAL_COMMUNITY): Payer: Medicare Other | Admitting: Anesthesiology

## 2013-07-23 ENCOUNTER — Encounter (HOSPITAL_COMMUNITY): Payer: Self-pay

## 2013-07-23 ENCOUNTER — Encounter (HOSPITAL_COMMUNITY): Payer: Medicare Other | Admitting: Anesthesiology

## 2013-07-23 ENCOUNTER — Ambulatory Visit (HOSPITAL_COMMUNITY)
Admission: RE | Admit: 2013-07-23 | Discharge: 2013-07-23 | Disposition: A | Discharge status: Home / Self Care | Payer: Medicare Other | Source: Ambulatory Visit | Attending: Vascular Surgery | Admitting: Vascular Surgery

## 2013-07-23 ENCOUNTER — Other Ambulatory Visit: Payer: Self-pay | Admitting: *Deleted

## 2013-07-23 DIAGNOSIS — T82898A Other specified complication of vascular prosthetic devices, implants and grafts, initial encounter: Secondary | ICD-10-CM | POA: Diagnosis not present

## 2013-07-23 DIAGNOSIS — I70209 Unspecified atherosclerosis of native arteries of extremities, unspecified extremity: Secondary | ICD-10-CM | POA: Insufficient documentation

## 2013-07-23 DIAGNOSIS — I871 Compression of vein: Secondary | ICD-10-CM | POA: Diagnosis not present

## 2013-07-23 DIAGNOSIS — Z79899 Other long term (current) drug therapy: Secondary | ICD-10-CM | POA: Insufficient documentation

## 2013-07-23 DIAGNOSIS — N186 End stage renal disease: Secondary | ICD-10-CM | POA: Diagnosis not present

## 2013-07-23 DIAGNOSIS — Y832 Surgical operation with anastomosis, bypass or graft as the cause of abnormal reaction of the patient, or of later complication, without mention of misadventure at the time of the procedure: Secondary | ICD-10-CM | POA: Insufficient documentation

## 2013-07-23 DIAGNOSIS — T82598A Other mechanical complication of other cardiac and vascular devices and implants, initial encounter: Secondary | ICD-10-CM | POA: Diagnosis not present

## 2013-07-23 DIAGNOSIS — Z01818 Encounter for other preprocedural examination: Secondary | ICD-10-CM | POA: Diagnosis not present

## 2013-07-23 DIAGNOSIS — Z4931 Encounter for adequacy testing for hemodialysis: Secondary | ICD-10-CM

## 2013-07-23 DIAGNOSIS — I10 Essential (primary) hypertension: Secondary | ICD-10-CM | POA: Diagnosis not present

## 2013-07-23 DIAGNOSIS — Z992 Dependence on renal dialysis: Secondary | ICD-10-CM | POA: Diagnosis not present

## 2013-07-23 DIAGNOSIS — I120 Hypertensive chronic kidney disease with stage 5 chronic kidney disease or end stage renal disease: Secondary | ICD-10-CM | POA: Diagnosis not present

## 2013-07-23 HISTORY — PX: PATCH ANGIOPLASTY: SHX6230

## 2013-07-23 LAB — POCT I-STAT 4, (NA,K, GLUC, HGB,HCT)
Glucose, Bld: 110 mg/dL — ABNORMAL HIGH (ref 70–99)
HCT: 35 % — ABNORMAL LOW (ref 39.0–52.0)
HEMOGLOBIN: 11.9 g/dL — AB (ref 13.0–17.0)
POTASSIUM: 4.5 meq/L (ref 3.7–5.3)
Sodium: 138 mEq/L (ref 137–147)

## 2013-07-23 LAB — GLUCOSE, CAPILLARY: Glucose-Capillary: 107 mg/dL — ABNORMAL HIGH (ref 70–99)

## 2013-07-23 SURGERY — ANGIOPLASTY, USING PATCH GRAFT
Anesthesia: General | Site: Arm Lower | Laterality: Left

## 2013-07-23 MED ORDER — OXYCODONE HCL 5 MG PO TABS
ORAL_TABLET | ORAL | Status: AC
  Filled 2013-07-23: qty 1

## 2013-07-23 MED ORDER — THROMBIN 20000 UNITS EX SOLR
CUTANEOUS | Status: AC
  Filled 2013-07-23: qty 20000

## 2013-07-23 MED ORDER — HYDROMORPHONE HCL PF 1 MG/ML IJ SOLN
0.2500 mg | INTRAMUSCULAR | Status: DC | PRN
  Administered 2013-07-23: 0.5 mg via INTRAVENOUS

## 2013-07-23 MED ORDER — PROPOFOL 10 MG/ML IV BOLUS
INTRAVENOUS | Status: DC | PRN
  Administered 2013-07-23: 50 mg via INTRAVENOUS
  Administered 2013-07-23: 150 mg via INTRAVENOUS
  Administered 2013-07-23: 50 mg via INTRAVENOUS

## 2013-07-23 MED ORDER — OXYCODONE HCL 5 MG PO TABS: 5.0000 mg | ORAL_TABLET | Freq: Four times a day (QID) | ORAL | Status: DC | PRN

## 2013-07-23 MED ORDER — OXYCODONE HCL 5 MG/5ML PO SOLN: 5.0000 mg | Freq: Once | ORAL | Status: AC | PRN

## 2013-07-23 MED ORDER — SODIUM CHLORIDE 0.9 % IV SOLN
INTRAVENOUS | Status: DC | PRN
  Administered 2013-07-23: 11:00:00

## 2013-07-23 MED ORDER — OXYCODONE HCL 5 MG PO TABS
5.0000 mg | ORAL_TABLET | Freq: Once | ORAL | Status: AC | PRN
  Administered 2013-07-23: 5 mg via ORAL

## 2013-07-23 MED ORDER — PROPOFOL INFUSION 10 MG/ML OPTIME
INTRAVENOUS | Status: DC | PRN
  Administered 2013-07-23: 50 ug/kg/min via INTRAVENOUS

## 2013-07-23 MED ORDER — FENTANYL CITRATE 0.05 MG/ML IJ SOLN
INTRAMUSCULAR | Status: DC | PRN
  Administered 2013-07-23: 100 ug via INTRAVENOUS
  Administered 2013-07-23 (×2): 50 ug via INTRAVENOUS
  Administered 2013-07-23: 100 ug via INTRAVENOUS

## 2013-07-23 MED ORDER — MIDAZOLAM HCL 5 MG/5ML IJ SOLN
INTRAMUSCULAR | Status: DC | PRN
  Administered 2013-07-23: 2 mg via INTRAVENOUS

## 2013-07-23 MED ORDER — SODIUM CHLORIDE 0.9 % IR SOLN
Status: DC | PRN
  Administered 2013-07-23: 10:00:00

## 2013-07-23 MED ORDER — LIDOCAINE HCL (CARDIAC) 20 MG/ML IV SOLN
INTRAVENOUS | Status: DC | PRN
  Administered 2013-07-23: 60 mg via INTRAVENOUS
  Administered 2013-07-23: 40 mg via INTRAVENOUS

## 2013-07-23 MED ORDER — LIDOCAINE HCL (PF) 1 % IJ SOLN
INTRAMUSCULAR | Status: AC
  Filled 2013-07-23: qty 30

## 2013-07-23 MED ORDER — PROTAMINE SULFATE 10 MG/ML IV SOLN
INTRAVENOUS | Status: DC | PRN
  Administered 2013-07-23: 40 mg via INTRAVENOUS

## 2013-07-23 MED ORDER — SODIUM CHLORIDE 0.9 % IV SOLN
INTRAVENOUS | Status: DC | PRN
  Administered 2013-07-23 (×2): via INTRAVENOUS

## 2013-07-23 MED ORDER — ONDANSETRON HCL 4 MG/2ML IJ SOLN
INTRAMUSCULAR | Status: DC | PRN
  Administered 2013-07-23: 4 mg via INTRAVENOUS

## 2013-07-23 MED ORDER — 0.9 % SODIUM CHLORIDE (POUR BTL) OPTIME
TOPICAL | Status: DC | PRN
  Administered 2013-07-23: 1000 mL

## 2013-07-23 MED ORDER — HEPARIN SODIUM (PORCINE) 1000 UNIT/ML IJ SOLN
INTRAMUSCULAR | Status: DC | PRN
  Administered 2013-07-23: 9000 [IU] via INTRAVENOUS

## 2013-07-23 MED ORDER — HYDROMORPHONE HCL PF 1 MG/ML IJ SOLN
INTRAMUSCULAR | Status: AC
  Filled 2013-07-23: qty 1

## 2013-07-23 SURGICAL SUPPLY — 46 items
ARMBAND PINK RESTRICT EXTREMIT (MISCELLANEOUS) ×3 IMPLANT
BANDAGE ESMARK 6X9 LF (GAUZE/BANDAGES/DRESSINGS) ×1 IMPLANT
BNDG ESMARK 6X9 LF (GAUZE/BANDAGES/DRESSINGS) ×3
CANISTER SUCTION 2500CC (MISCELLANEOUS) ×3 IMPLANT
CATH EMB 4FR 80CM (CATHETERS) ×3 IMPLANT
CLIP TI MEDIUM 6 (CLIP) ×3 IMPLANT
CLIP TI WIDE RED SMALL 6 (CLIP) ×3 IMPLANT
COVER SURGICAL LIGHT HANDLE (MISCELLANEOUS) ×3 IMPLANT
CUFF TOURNIQUET SINGLE 24IN (TOURNIQUET CUFF) ×3 IMPLANT
DERMABOND ADVANCED (GAUZE/BANDAGES/DRESSINGS) ×2
DERMABOND ADVANCED .7 DNX12 (GAUZE/BANDAGES/DRESSINGS) ×1 IMPLANT
DRAPE X-RAY CASS 24X20 (DRAPES) ×3 IMPLANT
ELECT REM PT RETURN 9FT ADLT (ELECTROSURGICAL) ×3
ELECTRODE REM PT RTRN 9FT ADLT (ELECTROSURGICAL) ×1 IMPLANT
GAUZE SPONGE 4X4 16PLY XRAY LF (GAUZE/BANDAGES/DRESSINGS) IMPLANT
GEL ULTRASOUND 20GR AQUASONIC (MISCELLANEOUS) ×3 IMPLANT
GLOVE BIO SURGEON STRL SZ 6.5 (GLOVE) ×6 IMPLANT
GLOVE BIO SURGEON STRL SZ7.5 (GLOVE) ×3 IMPLANT
GLOVE BIO SURGEONS STRL SZ 6.5 (GLOVE) ×3
GLOVE BIOGEL PI IND STRL 6.5 (GLOVE) ×1 IMPLANT
GLOVE BIOGEL PI IND STRL 8 (GLOVE) ×2 IMPLANT
GLOVE BIOGEL PI INDICATOR 6.5 (GLOVE) ×2
GLOVE BIOGEL PI INDICATOR 8 (GLOVE) ×4
GLOVE ECLIPSE 7.5 STRL STRAW (GLOVE) ×3 IMPLANT
GOWN STRL REUS W/ TWL LRG LVL3 (GOWN DISPOSABLE) ×3 IMPLANT
GOWN STRL REUS W/TWL LRG LVL3 (GOWN DISPOSABLE) ×6
KIT BASIN OR (CUSTOM PROCEDURE TRAY) ×3 IMPLANT
KIT ROOM TURNOVER OR (KITS) ×3 IMPLANT
NEEDLE HYPO 25GX1X1/2 BEV (NEEDLE) ×3 IMPLANT
NS IRRIG 1000ML POUR BTL (IV SOLUTION) ×3 IMPLANT
PACK CV ACCESS (CUSTOM PROCEDURE TRAY) ×3 IMPLANT
PAD ARMBOARD 7.5X6 YLW CONV (MISCELLANEOUS) ×6 IMPLANT
PATCH VASCULAR VASCU GUARD 1X6 (Vascular Products) ×3 IMPLANT
SET COLLECT BLD 21X3/4 12 (NEEDLE) ×3 IMPLANT
SPONGE SURGIFOAM ABS GEL 100 (HEMOSTASIS) IMPLANT
STOPCOCK 4 WAY LG BORE MALE ST (IV SETS) ×3 IMPLANT
SUT PROLENE 6 0 BV (SUTURE) ×3 IMPLANT
SUT VIC AB 3-0 SH 27 (SUTURE) ×2
SUT VIC AB 3-0 SH 27X BRD (SUTURE) ×1 IMPLANT
SUT VICRYL 4-0 PS2 18IN ABS (SUTURE) ×3 IMPLANT
SYR 20CC LL (SYRINGE) ×3 IMPLANT
TOWEL OR 17X24 6PK STRL BLUE (TOWEL DISPOSABLE) ×3 IMPLANT
TOWEL OR 17X26 10 PK STRL BLUE (TOWEL DISPOSABLE) ×3 IMPLANT
TUBING EXTENTION W/L.L. (IV SETS) ×3 IMPLANT
UNDERPAD 30X30 INCONTINENT (UNDERPADS AND DIAPERS) ×3 IMPLANT
WATER STERILE IRR 1000ML POUR (IV SOLUTION) ×3 IMPLANT

## 2013-07-23 NOTE — Anesthesia Postprocedure Evaluation (Signed)
  Anesthesia Post-op Note  Patient: Timothy Palmer  Procedure(s) Performed: Procedure(s): PATCH ANGIOPLASTY- LEFT RADIOCEPHALIC ARTERIOVENOUS FISTULA (Left)  Patient Location: PACU  Anesthesia Type:General  Level of Consciousness: awake and alert   Airway and Oxygen Therapy: Patient Spontanous Breathing  Post-op Pain: mild  Post-op Assessment: Post-op Vital signs reviewed, Patient's Cardiovascular Status Stable and Respiratory Function Stable  Post-op Vital Signs: Reviewed  Filed Vitals:   07/23/13 1255  BP: 137/93  Pulse: 76  Temp:   Resp:     Complications: No apparent anesthesia complications

## 2013-07-23 NOTE — Preoperative (Signed)
Beta Blockers   Reason not to administer Beta Blockers:Not Applicable 

## 2013-07-23 NOTE — H&P (View-Only) (Signed)
[facility]ist of [facility]  Patient name: Timothy Palmer MRN: [id number] DOB: 02/21/1966 Sex: male  REASON FOR CONSULT: To evaluate for revision of proximal left radiocephalic AV fistula. Referred by Dr. Otelia Santee.  HPI: Timothy Palmer is a 48 y.o. male who had a left radiocephalic fistula placed approximately 10 years ago. He has been using this for dialysis. He dialyzes on Tuesdays Thursdays and Saturdays. He most recently had a fistulogram by Dr. Augustin Coupe on 06/23/2013. 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 He had a fistulogram by interventional radiology in February of 2014. In reviewing those films, it appears to me that the fistula is being filled by retrograde flow in the radial artery via the palmar arch. There was a stenosis in the proximal fistula which was ballooned. This required very high pressures.  On his most recent fistulogram on 06/23/2013, reportedly this showed a 70% stenosis in the radial artery feeding the fistula. It was not felt that this could be addressed with angioplasty and he was sent for evaluation for possible patch angioplasty of the proximal fistula and radial artery. I do not have these films to review. Patient denies any specific problems with the fistula or pain in his left arm.   Past Medical History  Diagnosis Date  . Renal disorder   . Hypertension   . Peripheral vascular disease    Family History  Problem Relation Age of Onset  . Diabetes Mother   . Hypertension Mother   . Hypertension Father   . Hypertension Sister   . Hypertension Brother    SOCIAL HISTORY: History  Substance Use Topics  . Smoking status: Never Smoker   . Smokeless tobacco: Never Used  . Alcohol Use: Yes   No Known Allergies Current Outpatient Prescriptions  Medication Sig Dispense Refill  . LISINOPRIL PO Take by mouth.      . moxifloxacin (AVELOX) 400 MG tablet Take 1 tablet (400 mg total) by mouth daily.  10  tablet  0  . CINACALCET HCL PO Take by mouth.      . oxyCODONE-acetaminophen (PERCOCET) 5-325 MG per tablet Take 2 tablets by mouth every 4 (four) hours as needed for pain.  15 tablet  0  . Sevelamer HCl (RENAGEL PO) Take by mouth.       No current facility-administered medications for this visit.   REVIEW OF SYSTEMS: Valu.Nieves ] denotes positive finding; [  ] denotes negative finding  CARDIOVASCULAR:  [ ]  chest pain   [ ]  chest pressure   [ ]  palpitations   [ ]  orthopnea   [ ]  dyspnea on exertion   [ ]  claudication   [ ]  rest pain   [ ]  DVT   [ ]  phlebitis PULMONARY:   [ ]  productive cough   [ ]  asthma   [ ]  wheezing NEUROLOGIC:   [ ]  weakness  [ ]  paresthesias  [ ]  aphasia  [ ]  amaurosis  [ ]  dizziness HEMATOLOGIC:   [ ]  bleeding problems   [ ]  clotting disorders MUSCULOSKELETAL:  [ ]  joint pain   [ ]  joint swelling [ ]  leg swelling GASTROINTESTINAL: [ ]   blood in stool  [ ]   hematemesis GENITOURINARY:  [ ]   dysuria  [ ]   hematuria PSYCHIATRIC:  [ ]  history of major depression INTEGUMENTARY:  [ ]  rashes  [ ]  ulcers CONSTITUTIONAL:  [ ]  fever   [ ]  chills  PHYSICAL EXAM:  Filed Vitals:   06/30/13 1524  BP: 145/99  Pulse: 89  Resp: 16  Height: 5' 11.5" (1.816 m)  Weight: 241 lb (109.317 kg)  SpO2: 97%   Body mass index is 33.15 kg/(m^2). GENERAL: The patient is a well-nourished male, in no acute distress. The vital signs are documented above. CARDIOVASCULAR: There is a regular rate and rhythm.  PULMONARY: There is good air exchange bilaterally without wheezing or rales. 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  DATA:  I have reviewed his note from his previous fistulogram as described above.  MEDICAL ISSUES: I will try to obtain the films from Dr. Augustin Coupe and have scheduled him for patch angioplasty of the proximal fistula on 07/23/2012. 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 However, I agree that it is worth a try to improve the inflow to the fistula and hopefully get some more time out of this left radiocephalic AV fistula.  [place] [facility] Beeper: [phone]

## 2013-07-23 NOTE — Interval H&P Note (Signed)
History and Physical Interval Note:  07/23/2013 9:42 AM  Timothy Palmer  has presented today for surgery, with the diagnosis of End Stage Renal Disease  The various methods of treatment have been discussed with the patient and family. After consideration of risks, benefits and other options for treatment, the patient has consented to  Procedure(s): PATCH ANGIOPLASTY- LEFT RADIOCEPHALIC ARTERIOVENOUS FISTULA (Left) as a surgical intervention .  The patient's history has been reviewed, patient examined, no change in status, stable for surgery.  I have reviewed the patient's chart and labs.  Questions were answered to the patient's satisfaction.     [name] S

## 2013-07-23 NOTE — Transfer of Care (Signed)
Immediate Anesthesia Transfer of Care Note  Patient: Timothy Palmer  Procedure(s) Performed: Procedure(s): PATCH ANGIOPLASTY- LEFT RADIOCEPHALIC ARTERIOVENOUS FISTULA (Left)  Patient Location: PACU  Anesthesia Type:General  Level of Consciousness: awake, alert , oriented and sedated  Airway & Oxygen Therapy: Patient Spontanous Breathing and Patient connected to nasal cannula oxygen  Post-op Assessment: Report given to PACU RN, Post -op Vital signs reviewed and stable and Patient moving all extremities  Post vital signs: Reviewed and stable  Complications: No apparent anesthesia complications

## 2013-07-23 NOTE — Discharge Instructions (Signed)
° ° °  07/23/2013 Timothy Palmer [id number] 1965/11/04  Surgeon(s): Angelia Mould, MD  Procedure(s): PATCH ANGIOPLASTY- LEFT RADIOCEPHALIC ARTERIOVENOUS FISTULA  May stick graft on designated area only: DO NOT ever stick fistula over incision. May use fistula above incision.

## 2013-07-23 NOTE — Op Note (Signed)
    NAME: Timothy Palmer    MRN: [id number] DOB: [birth year]    DATE OF OPERATION: 07/23/2013  PREOP DIAGNOSIS: Poorly functioning left radiocephalic AV fistula  POSTOP DIAGNOSIS: same  PROCEDURE:  1. Revision of left radiocephalic AV fistula with bovine pericardial patch angioplasty of proximal fistula 2. Intraoperative fistulogram  SURGEON: Judeth Cornfield. Scot Dock, MD, FACS  ASSIST: Leontine Locket, PA, Silva Bandy  ANESTHESIA: Gen.   EBL: minimal  INDICATIONS: [name] is a 48 y.o. male has a poorly functioning left radiocephalic fistula. He has an occluded radial artery with retrograde filling of the fistula from the distal radial artery. Prior to considering new access it was felt that revising the proximal fistula where it was noted to be narrow might potentially increase the performance of the fistula.  FINDINGS: marked intimal hyperplasia at the proximal fistula. The cephalic vein gives off a basilic vein in the upper cephalic vein is occluded.  TECHNIQUE: The patient was taken to the operating room and received a general anesthetic. The left upper extremity was prepped and draped in usual sterile fashion. A longitudinal incision was made over the proximal fistula and the fistula was dissected free down to where it was anastomosed to the radial artery. There was dense scar tissue present here. For this reason I elected to use a tourniquet. The patient was heparinized. The arm was exsanguinated with an Esmarch bandage the tourniquet inflated to 250 mm mercury. Under tourniquet control, a longitudinal venotomy was made over the proximal fistula and this was extended down into the radial artery. The area of intimal hyperplasia was identified and the venotomy extended beyond this point proximally. A bovine pericardial patch was then sewn using continuous 6-0 Prolene suture. At the completion an intraoperative fistulogram was obtained which demonstrated the basilic vein branched off the  cephalic vein below the antecubital level and the upper cephalic vein did not fill. The heparin was partially reversed with protamine. The wounds closed the deep layer 3-0 Vicryl and the skin closed with 4-0 Vicryl. Dermabond was applied. The patient tolerated the procedure well was transferred to the recovery room in stable condition. All needle and sponge counts were correct.  CLINICAL NOTE: If this fistula occludes in his next best option would be a left basilic vein transposition or otherwise he would require an AV graft.  Deitra Mayo, MD, FACS [facility]  DATE OF DICTATION:   07/23/2013

## 2013-07-23 NOTE — Anesthesia Preprocedure Evaluation (Addendum)
Anesthesia Evaluation  Patient identified by MRN, date of birth, ID band Patient awake    Reviewed: Allergy & Precautions, H&P , NPO status , Patient's Chart, lab work & pertinent test results  Airway Mallampati: III TM Distance: >3 FB Neck ROM: Full    Dental no notable dental hx. (+) Teeth Intact and Dental Advisory Given   Pulmonary neg pulmonary ROS,  breath sounds clear to auscultation  Pulmonary exam normal       Cardiovascular hypertension, On Medications + Peripheral Vascular Disease Rhythm:Regular Rate:Normal     Neuro/Psych negative neurological ROS  negative psych ROS   GI/Hepatic negative GI ROS, Neg liver ROS,   Endo/Other  negative endocrine ROS  Renal/GU Dialysis and ESRFRenal disease  negative genitourinary   Musculoskeletal   Abdominal   Peds  Hematology negative hematology ROS (+)   Anesthesia Other Findings   Reproductive/Obstetrics negative OB ROS                          Anesthesia Physical Anesthesia Plan  ASA: III  Anesthesia Plan: General   Post-op Pain Management:    Induction: Intravenous  Airway Management Planned: LMA  Additional Equipment:   Intra-op Plan:   Post-operative Plan: Extubation in OR  Informed Consent: I have reviewed the patients History and Physical, chart, labs and discussed the procedure including the risks, benefits and alternatives for the proposed anesthesia with the patient or authorized representative who has indicated his/her understanding and acceptance.   Dental advisory given  Plan Discussed with: CRNA  Anesthesia Plan Comments:         Anesthesia Quick Evaluation

## 2013-07-27 ENCOUNTER — Encounter (HOSPITAL_COMMUNITY): Payer: Self-pay | Admitting: Vascular Surgery

## 2013-07-27 ENCOUNTER — Telehealth: Payer: Self-pay | Admitting: Vascular Surgery

## 2013-07-27 NOTE — Telephone Encounter (Addendum)
Message copied by Gena Fray on Tue Jul 27, 2013 10:56 AM ------      Message from: [place], [place] K      Created: Fri Jul 23, 2013  3:24 PM      Regarding: RE: charge       Sorry I do know the patient, he had a patch angio and revision, so yes he would need an appt in 4-6 weeks with u/s I would think      ----- Message -----         From: Gena Fray         Sent: 07/23/2013   1:19 PM           To: Mena Goes, CMA      Subject: RE: charge                                               Zigmund Daniel,            Dr Scot Dock sent this to me directly. I am sure they need a follow up, but there are no instructions. Should revisions be seen within 4 weeks?            Thanks,      Hinton Dyer      ----- Message -----         From: Angelia Mould, MD         Sent: 07/23/2013  11:38 AM           To: [facility] Admin Pool      Subject: charge                                                   PROCEDURE:       1. Revision of left radiocephalic AV fistula with bovine pericardial patch angioplasty of proximal fistula      2. Intraoperative fistulogram            SURGEON: Judeth Cornfield. Scot Dock, MD, FACS            ASSIST: Leontine Locket, PA, Silva Bandy                   ------  07/27/13: left detailed message for pt regarding appt on 09/01/13 @ 2:00pm, dpm

## 2013-08-05 DIAGNOSIS — E1129 Type 2 diabetes mellitus with other diabetic kidney complication: Secondary | ICD-10-CM | POA: Diagnosis not present

## 2013-08-05 DIAGNOSIS — N186 End stage renal disease: Secondary | ICD-10-CM | POA: Diagnosis not present

## 2013-08-07 DIAGNOSIS — N186 End stage renal disease: Secondary | ICD-10-CM | POA: Diagnosis not present

## 2013-08-10 DIAGNOSIS — D509 Iron deficiency anemia, unspecified: Secondary | ICD-10-CM | POA: Diagnosis not present

## 2013-08-10 DIAGNOSIS — N2581 Secondary hyperparathyroidism of renal origin: Secondary | ICD-10-CM | POA: Diagnosis not present

## 2013-08-10 DIAGNOSIS — N186 End stage renal disease: Secondary | ICD-10-CM | POA: Diagnosis not present

## 2013-08-10 DIAGNOSIS — D631 Anemia in chronic kidney disease: Secondary | ICD-10-CM | POA: Diagnosis not present

## 2013-08-31 ENCOUNTER — Encounter: Payer: Self-pay | Admitting: Vascular Surgery

## 2013-09-01 ENCOUNTER — Ambulatory Visit (HOSPITAL_COMMUNITY)
Admission: RE | Admit: 2013-09-01 | Discharge: 2013-09-01 | Disposition: A | Discharge status: Home / Self Care | Payer: Medicare Other | Source: Ambulatory Visit | Attending: Vascular Surgery | Admitting: Vascular Surgery

## 2013-09-01 ENCOUNTER — Ambulatory Visit (INDEPENDENT_AMBULATORY_CARE_PROVIDER_SITE_OTHER): Payer: Self-pay | Admitting: Vascular Surgery

## 2013-09-01 ENCOUNTER — Encounter: Payer: Self-pay | Admitting: Vascular Surgery

## 2013-09-01 VITALS — BP 185/105 | HR 84 | Ht 71.5 in | Wt 246.0 lb

## 2013-09-01 DIAGNOSIS — Z9889 Other specified postprocedural states: Secondary | ICD-10-CM | POA: Insufficient documentation

## 2013-09-01 DIAGNOSIS — N186 End stage renal disease: Secondary | ICD-10-CM | POA: Diagnosis not present

## 2013-09-01 DIAGNOSIS — Z4931 Encounter for adequacy testing for hemodialysis: Secondary | ICD-10-CM

## 2013-09-01 DIAGNOSIS — Z09 Encounter for follow-up examination after completed treatment for conditions other than malignant neoplasm: Secondary | ICD-10-CM | POA: Diagnosis not present

## 2013-09-01 NOTE — Progress Notes (Signed)
   Patient name: Timothy Palmer MRN: [id number] DOB: Sep 01, 1965 Sex: male  REASON FOR VISIT: Follow up of left radiocephalic AV fistula  HPI: Timothy Palmer is a 48 y.o. male who had a poorly functioning left radiocephalic AV fistula. The radial artery was occluded proximal to the fistula and the fistula was filling from retrograde flow in the radial artery beyond the anastomosis. On 07/23/2013, the patient underwent revision of a left radiocephalic AV fistula with bovine paracardial patch angioplasty of the proximal fistula.  States that the fistula has been working well. He has no specific complaints.  REVIEW OF SYSTEMS: Valu.Nieves ] denotes positive finding; [  ] denotes negative finding  CARDIOVASCULAR:  [ ]  chest pain   [ ]  dyspnea on exertion    CONSTITUTIONAL:  [ ]  fever   [ ]  chills  PHYSICAL EXAM: Filed Vitals:   09/01/13 1530  BP: 185/105  Pulse: 84  Height: 5' 11.5" (1.816 m)  Weight: 246 lb (111.585 kg)  SpO2: 100%   Body mass index is 33.84 kg/(m^2). GENERAL: The patient is a well-nourished male, in no acute distress. The vital signs are documented above. CARDIOVASCULAR: There is a regular rate and rhythm. PULMONARY: There is good air exchange bilaterally without wheezing or rales. His fistula has a good thrill. He states that it is working well.  Duplex scan shows that the fistula is patent with acceptable diameters and depths. The forearm cephalic vein empties into the basilic system.  MEDICAL ISSUES: The patient's left forearm AV fistula is working better now status post patch angioplasty of the proximal fistula. Hopefully he will be able to get significantly more time out of the fistula. If this fails however, given the multiple issues with this, he would likely require a new access. The next logical step is likely a basilic vein transposition on the left.  [place] [facility]: [phone]

## 2013-09-04 DIAGNOSIS — N186 End stage renal disease: Secondary | ICD-10-CM | POA: Diagnosis not present

## 2013-09-07 DIAGNOSIS — D509 Iron deficiency anemia, unspecified: Secondary | ICD-10-CM | POA: Diagnosis not present

## 2013-09-07 DIAGNOSIS — N039 Chronic nephritic syndrome with unspecified morphologic changes: Secondary | ICD-10-CM | POA: Diagnosis not present

## 2013-09-07 DIAGNOSIS — D631 Anemia in chronic kidney disease: Secondary | ICD-10-CM | POA: Diagnosis not present

## 2013-09-07 DIAGNOSIS — N186 End stage renal disease: Secondary | ICD-10-CM | POA: Diagnosis not present

## 2013-09-07 DIAGNOSIS — N2581 Secondary hyperparathyroidism of renal origin: Secondary | ICD-10-CM | POA: Diagnosis not present

## 2013-10-05 DIAGNOSIS — N186 End stage renal disease: Secondary | ICD-10-CM | POA: Diagnosis not present

## 2013-10-07 DIAGNOSIS — N2581 Secondary hyperparathyroidism of renal origin: Secondary | ICD-10-CM | POA: Diagnosis not present

## 2013-10-07 DIAGNOSIS — D509 Iron deficiency anemia, unspecified: Secondary | ICD-10-CM | POA: Diagnosis not present

## 2013-10-07 DIAGNOSIS — N039 Chronic nephritic syndrome with unspecified morphologic changes: Secondary | ICD-10-CM | POA: Diagnosis not present

## 2013-10-07 DIAGNOSIS — D631 Anemia in chronic kidney disease: Secondary | ICD-10-CM | POA: Diagnosis not present

## 2013-10-07 DIAGNOSIS — N186 End stage renal disease: Secondary | ICD-10-CM | POA: Diagnosis not present

## 2013-11-04 DIAGNOSIS — N186 End stage renal disease: Secondary | ICD-10-CM | POA: Diagnosis not present

## 2013-11-06 DIAGNOSIS — N186 End stage renal disease: Secondary | ICD-10-CM | POA: Diagnosis not present

## 2013-11-06 DIAGNOSIS — N2581 Secondary hyperparathyroidism of renal origin: Secondary | ICD-10-CM | POA: Diagnosis not present

## 2013-11-06 DIAGNOSIS — R6883 Chills (without fever): Secondary | ICD-10-CM | POA: Diagnosis not present

## 2013-11-06 DIAGNOSIS — D631 Anemia in chronic kidney disease: Secondary | ICD-10-CM | POA: Diagnosis not present

## 2013-11-06 DIAGNOSIS — D509 Iron deficiency anemia, unspecified: Secondary | ICD-10-CM | POA: Diagnosis not present

## 2013-11-23 DIAGNOSIS — R6883 Chills (without fever): Secondary | ICD-10-CM | POA: Diagnosis not present

## 2013-11-25 DIAGNOSIS — E1129 Type 2 diabetes mellitus with other diabetic kidney complication: Secondary | ICD-10-CM | POA: Diagnosis not present

## 2013-11-27 DIAGNOSIS — R7881 Bacteremia: Secondary | ICD-10-CM | POA: Diagnosis present

## 2013-12-05 DIAGNOSIS — N186 End stage renal disease: Secondary | ICD-10-CM | POA: Diagnosis not present

## 2013-12-07 DIAGNOSIS — N186 End stage renal disease: Secondary | ICD-10-CM | POA: Diagnosis not present

## 2013-12-07 DIAGNOSIS — D509 Iron deficiency anemia, unspecified: Secondary | ICD-10-CM | POA: Diagnosis not present

## 2013-12-07 DIAGNOSIS — R6883 Chills (without fever): Secondary | ICD-10-CM | POA: Diagnosis not present

## 2013-12-07 DIAGNOSIS — D631 Anemia in chronic kidney disease: Secondary | ICD-10-CM | POA: Diagnosis not present

## 2013-12-07 DIAGNOSIS — N039 Chronic nephritic syndrome with unspecified morphologic changes: Secondary | ICD-10-CM | POA: Diagnosis not present

## 2013-12-07 DIAGNOSIS — N2581 Secondary hyperparathyroidism of renal origin: Secondary | ICD-10-CM | POA: Diagnosis not present

## 2013-12-09 DIAGNOSIS — N186 End stage renal disease: Secondary | ICD-10-CM | POA: Diagnosis not present

## 2013-12-09 DIAGNOSIS — N2581 Secondary hyperparathyroidism of renal origin: Secondary | ICD-10-CM | POA: Diagnosis not present

## 2013-12-09 DIAGNOSIS — D631 Anemia in chronic kidney disease: Secondary | ICD-10-CM | POA: Diagnosis not present

## 2013-12-09 DIAGNOSIS — R6883 Chills (without fever): Secondary | ICD-10-CM | POA: Diagnosis not present

## 2013-12-09 DIAGNOSIS — D509 Iron deficiency anemia, unspecified: Secondary | ICD-10-CM | POA: Diagnosis not present

## 2013-12-11 DIAGNOSIS — N2581 Secondary hyperparathyroidism of renal origin: Secondary | ICD-10-CM | POA: Diagnosis not present

## 2013-12-11 DIAGNOSIS — D509 Iron deficiency anemia, unspecified: Secondary | ICD-10-CM | POA: Diagnosis not present

## 2013-12-11 DIAGNOSIS — N186 End stage renal disease: Secondary | ICD-10-CM | POA: Diagnosis not present

## 2013-12-11 DIAGNOSIS — D631 Anemia in chronic kidney disease: Secondary | ICD-10-CM | POA: Diagnosis not present

## 2013-12-11 DIAGNOSIS — R6883 Chills (without fever): Secondary | ICD-10-CM | POA: Diagnosis not present

## 2013-12-14 DIAGNOSIS — D631 Anemia in chronic kidney disease: Secondary | ICD-10-CM | POA: Diagnosis not present

## 2013-12-14 DIAGNOSIS — R6883 Chills (without fever): Secondary | ICD-10-CM | POA: Diagnosis not present

## 2013-12-14 DIAGNOSIS — D509 Iron deficiency anemia, unspecified: Secondary | ICD-10-CM | POA: Diagnosis not present

## 2013-12-14 DIAGNOSIS — N039 Chronic nephritic syndrome with unspecified morphologic changes: Secondary | ICD-10-CM | POA: Diagnosis not present

## 2013-12-14 DIAGNOSIS — N186 End stage renal disease: Secondary | ICD-10-CM | POA: Diagnosis not present

## 2013-12-14 DIAGNOSIS — N2581 Secondary hyperparathyroidism of renal origin: Secondary | ICD-10-CM | POA: Diagnosis not present

## 2013-12-16 DIAGNOSIS — N2581 Secondary hyperparathyroidism of renal origin: Secondary | ICD-10-CM | POA: Diagnosis not present

## 2013-12-16 DIAGNOSIS — D509 Iron deficiency anemia, unspecified: Secondary | ICD-10-CM | POA: Diagnosis not present

## 2013-12-16 DIAGNOSIS — D631 Anemia in chronic kidney disease: Secondary | ICD-10-CM | POA: Diagnosis not present

## 2013-12-16 DIAGNOSIS — N039 Chronic nephritic syndrome with unspecified morphologic changes: Secondary | ICD-10-CM | POA: Diagnosis not present

## 2013-12-16 DIAGNOSIS — N186 End stage renal disease: Secondary | ICD-10-CM | POA: Diagnosis not present

## 2013-12-16 DIAGNOSIS — R6883 Chills (without fever): Secondary | ICD-10-CM | POA: Diagnosis not present

## 2013-12-18 DIAGNOSIS — D509 Iron deficiency anemia, unspecified: Secondary | ICD-10-CM | POA: Diagnosis not present

## 2013-12-18 DIAGNOSIS — R6883 Chills (without fever): Secondary | ICD-10-CM | POA: Diagnosis not present

## 2013-12-18 DIAGNOSIS — N186 End stage renal disease: Secondary | ICD-10-CM | POA: Diagnosis not present

## 2013-12-18 DIAGNOSIS — D631 Anemia in chronic kidney disease: Secondary | ICD-10-CM | POA: Diagnosis not present

## 2013-12-18 DIAGNOSIS — N2581 Secondary hyperparathyroidism of renal origin: Secondary | ICD-10-CM | POA: Diagnosis not present

## 2013-12-21 DIAGNOSIS — D509 Iron deficiency anemia, unspecified: Secondary | ICD-10-CM | POA: Diagnosis not present

## 2013-12-21 DIAGNOSIS — N186 End stage renal disease: Secondary | ICD-10-CM | POA: Diagnosis not present

## 2013-12-21 DIAGNOSIS — N2581 Secondary hyperparathyroidism of renal origin: Secondary | ICD-10-CM | POA: Diagnosis not present

## 2013-12-21 DIAGNOSIS — D631 Anemia in chronic kidney disease: Secondary | ICD-10-CM | POA: Diagnosis not present

## 2013-12-21 DIAGNOSIS — R6883 Chills (without fever): Secondary | ICD-10-CM | POA: Diagnosis not present

## 2013-12-23 DIAGNOSIS — D509 Iron deficiency anemia, unspecified: Secondary | ICD-10-CM | POA: Diagnosis not present

## 2013-12-23 DIAGNOSIS — R6883 Chills (without fever): Secondary | ICD-10-CM | POA: Diagnosis not present

## 2013-12-23 DIAGNOSIS — N186 End stage renal disease: Secondary | ICD-10-CM | POA: Diagnosis not present

## 2013-12-23 DIAGNOSIS — N2581 Secondary hyperparathyroidism of renal origin: Secondary | ICD-10-CM | POA: Diagnosis not present

## 2013-12-23 DIAGNOSIS — D631 Anemia in chronic kidney disease: Secondary | ICD-10-CM | POA: Diagnosis not present

## 2013-12-25 DIAGNOSIS — R6883 Chills (without fever): Secondary | ICD-10-CM | POA: Diagnosis not present

## 2013-12-25 DIAGNOSIS — D631 Anemia in chronic kidney disease: Secondary | ICD-10-CM | POA: Diagnosis not present

## 2013-12-25 DIAGNOSIS — D509 Iron deficiency anemia, unspecified: Secondary | ICD-10-CM | POA: Diagnosis not present

## 2013-12-25 DIAGNOSIS — N2581 Secondary hyperparathyroidism of renal origin: Secondary | ICD-10-CM | POA: Diagnosis not present

## 2013-12-25 DIAGNOSIS — N186 End stage renal disease: Secondary | ICD-10-CM | POA: Diagnosis not present

## 2013-12-28 DIAGNOSIS — R6883 Chills (without fever): Secondary | ICD-10-CM | POA: Diagnosis not present

## 2013-12-28 DIAGNOSIS — D631 Anemia in chronic kidney disease: Secondary | ICD-10-CM | POA: Diagnosis not present

## 2013-12-28 DIAGNOSIS — N039 Chronic nephritic syndrome with unspecified morphologic changes: Secondary | ICD-10-CM | POA: Diagnosis not present

## 2013-12-28 DIAGNOSIS — N186 End stage renal disease: Secondary | ICD-10-CM | POA: Diagnosis not present

## 2013-12-28 DIAGNOSIS — N2581 Secondary hyperparathyroidism of renal origin: Secondary | ICD-10-CM | POA: Diagnosis not present

## 2013-12-28 DIAGNOSIS — D509 Iron deficiency anemia, unspecified: Secondary | ICD-10-CM | POA: Diagnosis not present

## 2013-12-30 DIAGNOSIS — D509 Iron deficiency anemia, unspecified: Secondary | ICD-10-CM | POA: Diagnosis not present

## 2013-12-30 DIAGNOSIS — N186 End stage renal disease: Secondary | ICD-10-CM | POA: Diagnosis not present

## 2013-12-30 DIAGNOSIS — N2581 Secondary hyperparathyroidism of renal origin: Secondary | ICD-10-CM | POA: Diagnosis not present

## 2013-12-30 DIAGNOSIS — R6883 Chills (without fever): Secondary | ICD-10-CM | POA: Diagnosis not present

## 2013-12-30 DIAGNOSIS — D631 Anemia in chronic kidney disease: Secondary | ICD-10-CM | POA: Diagnosis not present

## 2014-01-01 DIAGNOSIS — N039 Chronic nephritic syndrome with unspecified morphologic changes: Secondary | ICD-10-CM | POA: Diagnosis not present

## 2014-01-01 DIAGNOSIS — D631 Anemia in chronic kidney disease: Secondary | ICD-10-CM | POA: Diagnosis not present

## 2014-01-01 DIAGNOSIS — R6883 Chills (without fever): Secondary | ICD-10-CM | POA: Diagnosis not present

## 2014-01-01 DIAGNOSIS — D509 Iron deficiency anemia, unspecified: Secondary | ICD-10-CM | POA: Diagnosis not present

## 2014-01-01 DIAGNOSIS — N2581 Secondary hyperparathyroidism of renal origin: Secondary | ICD-10-CM | POA: Diagnosis not present

## 2014-01-01 DIAGNOSIS — N186 End stage renal disease: Secondary | ICD-10-CM | POA: Diagnosis not present

## 2014-01-04 DIAGNOSIS — R6883 Chills (without fever): Secondary | ICD-10-CM | POA: Diagnosis not present

## 2014-01-04 DIAGNOSIS — N039 Chronic nephritic syndrome with unspecified morphologic changes: Secondary | ICD-10-CM | POA: Diagnosis not present

## 2014-01-04 DIAGNOSIS — N186 End stage renal disease: Secondary | ICD-10-CM | POA: Diagnosis not present

## 2014-01-04 DIAGNOSIS — N2581 Secondary hyperparathyroidism of renal origin: Secondary | ICD-10-CM | POA: Diagnosis not present

## 2014-01-04 DIAGNOSIS — D509 Iron deficiency anemia, unspecified: Secondary | ICD-10-CM | POA: Diagnosis not present

## 2014-01-04 DIAGNOSIS — D631 Anemia in chronic kidney disease: Secondary | ICD-10-CM | POA: Diagnosis not present

## 2014-01-06 DIAGNOSIS — D631 Anemia in chronic kidney disease: Secondary | ICD-10-CM | POA: Diagnosis not present

## 2014-01-06 DIAGNOSIS — N186 End stage renal disease: Secondary | ICD-10-CM | POA: Diagnosis not present

## 2014-01-06 DIAGNOSIS — N039 Chronic nephritic syndrome with unspecified morphologic changes: Secondary | ICD-10-CM | POA: Diagnosis not present

## 2014-01-06 DIAGNOSIS — N2581 Secondary hyperparathyroidism of renal origin: Secondary | ICD-10-CM | POA: Diagnosis not present

## 2014-01-06 DIAGNOSIS — D509 Iron deficiency anemia, unspecified: Secondary | ICD-10-CM | POA: Diagnosis not present

## 2014-01-06 DIAGNOSIS — R7881 Bacteremia: Secondary | ICD-10-CM | POA: Diagnosis not present

## 2014-01-08 DIAGNOSIS — N039 Chronic nephritic syndrome with unspecified morphologic changes: Secondary | ICD-10-CM | POA: Diagnosis not present

## 2014-01-08 DIAGNOSIS — D509 Iron deficiency anemia, unspecified: Secondary | ICD-10-CM | POA: Diagnosis not present

## 2014-01-08 DIAGNOSIS — R7881 Bacteremia: Secondary | ICD-10-CM | POA: Diagnosis not present

## 2014-01-08 DIAGNOSIS — D631 Anemia in chronic kidney disease: Secondary | ICD-10-CM | POA: Diagnosis not present

## 2014-01-08 DIAGNOSIS — N2581 Secondary hyperparathyroidism of renal origin: Secondary | ICD-10-CM | POA: Diagnosis not present

## 2014-01-08 DIAGNOSIS — N186 End stage renal disease: Secondary | ICD-10-CM | POA: Diagnosis not present

## 2014-01-11 DIAGNOSIS — R7881 Bacteremia: Secondary | ICD-10-CM | POA: Diagnosis not present

## 2014-01-11 DIAGNOSIS — D631 Anemia in chronic kidney disease: Secondary | ICD-10-CM | POA: Diagnosis not present

## 2014-01-11 DIAGNOSIS — N186 End stage renal disease: Secondary | ICD-10-CM | POA: Diagnosis not present

## 2014-01-11 DIAGNOSIS — D509 Iron deficiency anemia, unspecified: Secondary | ICD-10-CM | POA: Diagnosis not present

## 2014-01-11 DIAGNOSIS — N039 Chronic nephritic syndrome with unspecified morphologic changes: Secondary | ICD-10-CM | POA: Diagnosis not present

## 2014-01-11 DIAGNOSIS — N2581 Secondary hyperparathyroidism of renal origin: Secondary | ICD-10-CM | POA: Diagnosis not present

## 2014-01-13 DIAGNOSIS — D631 Anemia in chronic kidney disease: Secondary | ICD-10-CM | POA: Diagnosis not present

## 2014-01-13 DIAGNOSIS — R7881 Bacteremia: Secondary | ICD-10-CM | POA: Diagnosis not present

## 2014-01-13 DIAGNOSIS — D509 Iron deficiency anemia, unspecified: Secondary | ICD-10-CM | POA: Diagnosis not present

## 2014-01-13 DIAGNOSIS — N186 End stage renal disease: Secondary | ICD-10-CM | POA: Diagnosis not present

## 2014-01-13 DIAGNOSIS — N2581 Secondary hyperparathyroidism of renal origin: Secondary | ICD-10-CM | POA: Diagnosis not present

## 2014-01-15 DIAGNOSIS — R7881 Bacteremia: Secondary | ICD-10-CM | POA: Diagnosis not present

## 2014-01-15 DIAGNOSIS — N186 End stage renal disease: Secondary | ICD-10-CM | POA: Diagnosis not present

## 2014-01-15 DIAGNOSIS — D631 Anemia in chronic kidney disease: Secondary | ICD-10-CM | POA: Diagnosis not present

## 2014-01-15 DIAGNOSIS — D509 Iron deficiency anemia, unspecified: Secondary | ICD-10-CM | POA: Diagnosis not present

## 2014-01-15 DIAGNOSIS — N2581 Secondary hyperparathyroidism of renal origin: Secondary | ICD-10-CM | POA: Diagnosis not present

## 2014-01-18 DIAGNOSIS — N2581 Secondary hyperparathyroidism of renal origin: Secondary | ICD-10-CM | POA: Diagnosis not present

## 2014-01-18 DIAGNOSIS — D631 Anemia in chronic kidney disease: Secondary | ICD-10-CM | POA: Diagnosis not present

## 2014-01-18 DIAGNOSIS — N186 End stage renal disease: Secondary | ICD-10-CM | POA: Diagnosis not present

## 2014-01-18 DIAGNOSIS — D509 Iron deficiency anemia, unspecified: Secondary | ICD-10-CM | POA: Diagnosis not present

## 2014-01-18 DIAGNOSIS — R7881 Bacteremia: Secondary | ICD-10-CM | POA: Diagnosis not present

## 2014-01-20 DIAGNOSIS — D509 Iron deficiency anemia, unspecified: Secondary | ICD-10-CM | POA: Diagnosis not present

## 2014-01-20 DIAGNOSIS — R7881 Bacteremia: Secondary | ICD-10-CM | POA: Diagnosis not present

## 2014-01-20 DIAGNOSIS — D631 Anemia in chronic kidney disease: Secondary | ICD-10-CM | POA: Diagnosis not present

## 2014-01-20 DIAGNOSIS — N186 End stage renal disease: Secondary | ICD-10-CM | POA: Diagnosis not present

## 2014-01-20 DIAGNOSIS — N2581 Secondary hyperparathyroidism of renal origin: Secondary | ICD-10-CM | POA: Diagnosis not present

## 2014-01-22 DIAGNOSIS — N186 End stage renal disease: Secondary | ICD-10-CM | POA: Diagnosis not present

## 2014-01-22 DIAGNOSIS — N2581 Secondary hyperparathyroidism of renal origin: Secondary | ICD-10-CM | POA: Diagnosis not present

## 2014-01-22 DIAGNOSIS — D631 Anemia in chronic kidney disease: Secondary | ICD-10-CM | POA: Diagnosis not present

## 2014-01-22 DIAGNOSIS — R7881 Bacteremia: Secondary | ICD-10-CM | POA: Diagnosis not present

## 2014-01-22 DIAGNOSIS — D509 Iron deficiency anemia, unspecified: Secondary | ICD-10-CM | POA: Diagnosis not present

## 2014-01-25 DIAGNOSIS — N039 Chronic nephritic syndrome with unspecified morphologic changes: Secondary | ICD-10-CM | POA: Diagnosis not present

## 2014-01-25 DIAGNOSIS — D509 Iron deficiency anemia, unspecified: Secondary | ICD-10-CM | POA: Diagnosis not present

## 2014-01-25 DIAGNOSIS — D631 Anemia in chronic kidney disease: Secondary | ICD-10-CM | POA: Diagnosis not present

## 2014-01-25 DIAGNOSIS — R7881 Bacteremia: Secondary | ICD-10-CM | POA: Diagnosis not present

## 2014-01-25 DIAGNOSIS — N2581 Secondary hyperparathyroidism of renal origin: Secondary | ICD-10-CM | POA: Diagnosis not present

## 2014-01-25 DIAGNOSIS — N186 End stage renal disease: Secondary | ICD-10-CM | POA: Diagnosis not present

## 2014-01-27 DIAGNOSIS — D631 Anemia in chronic kidney disease: Secondary | ICD-10-CM | POA: Diagnosis not present

## 2014-01-27 DIAGNOSIS — R7881 Bacteremia: Secondary | ICD-10-CM | POA: Diagnosis not present

## 2014-01-27 DIAGNOSIS — N186 End stage renal disease: Secondary | ICD-10-CM | POA: Diagnosis not present

## 2014-01-27 DIAGNOSIS — D509 Iron deficiency anemia, unspecified: Secondary | ICD-10-CM | POA: Diagnosis not present

## 2014-01-27 DIAGNOSIS — E1129 Type 2 diabetes mellitus with other diabetic kidney complication: Secondary | ICD-10-CM | POA: Diagnosis not present

## 2014-01-27 DIAGNOSIS — N2581 Secondary hyperparathyroidism of renal origin: Secondary | ICD-10-CM | POA: Diagnosis not present

## 2014-01-29 DIAGNOSIS — N2581 Secondary hyperparathyroidism of renal origin: Secondary | ICD-10-CM | POA: Diagnosis not present

## 2014-01-29 DIAGNOSIS — R7881 Bacteremia: Secondary | ICD-10-CM | POA: Diagnosis not present

## 2014-01-29 DIAGNOSIS — D509 Iron deficiency anemia, unspecified: Secondary | ICD-10-CM | POA: Diagnosis not present

## 2014-01-29 DIAGNOSIS — N186 End stage renal disease: Secondary | ICD-10-CM | POA: Diagnosis not present

## 2014-01-29 DIAGNOSIS — D631 Anemia in chronic kidney disease: Secondary | ICD-10-CM | POA: Diagnosis not present

## 2014-02-01 DIAGNOSIS — D509 Iron deficiency anemia, unspecified: Secondary | ICD-10-CM | POA: Diagnosis not present

## 2014-02-01 DIAGNOSIS — N186 End stage renal disease: Secondary | ICD-10-CM | POA: Diagnosis not present

## 2014-02-01 DIAGNOSIS — D631 Anemia in chronic kidney disease: Secondary | ICD-10-CM | POA: Diagnosis not present

## 2014-02-01 DIAGNOSIS — N2581 Secondary hyperparathyroidism of renal origin: Secondary | ICD-10-CM | POA: Diagnosis not present

## 2014-02-01 DIAGNOSIS — R7881 Bacteremia: Secondary | ICD-10-CM | POA: Diagnosis not present

## 2014-02-03 DIAGNOSIS — D509 Iron deficiency anemia, unspecified: Secondary | ICD-10-CM | POA: Diagnosis not present

## 2014-02-03 DIAGNOSIS — D631 Anemia in chronic kidney disease: Secondary | ICD-10-CM | POA: Diagnosis not present

## 2014-02-03 DIAGNOSIS — N186 End stage renal disease: Secondary | ICD-10-CM | POA: Diagnosis not present

## 2014-02-03 DIAGNOSIS — R7881 Bacteremia: Secondary | ICD-10-CM | POA: Diagnosis not present

## 2014-02-03 DIAGNOSIS — N039 Chronic nephritic syndrome with unspecified morphologic changes: Secondary | ICD-10-CM | POA: Diagnosis not present

## 2014-02-03 DIAGNOSIS — N2581 Secondary hyperparathyroidism of renal origin: Secondary | ICD-10-CM | POA: Diagnosis not present

## 2014-02-04 DIAGNOSIS — N186 End stage renal disease: Secondary | ICD-10-CM | POA: Diagnosis not present

## 2014-02-05 DIAGNOSIS — N2581 Secondary hyperparathyroidism of renal origin: Secondary | ICD-10-CM | POA: Diagnosis not present

## 2014-02-05 DIAGNOSIS — D509 Iron deficiency anemia, unspecified: Secondary | ICD-10-CM | POA: Diagnosis not present

## 2014-02-05 DIAGNOSIS — N186 End stage renal disease: Secondary | ICD-10-CM | POA: Diagnosis not present

## 2014-02-05 DIAGNOSIS — R6883 Chills (without fever): Secondary | ICD-10-CM | POA: Diagnosis not present

## 2014-02-05 DIAGNOSIS — R7881 Bacteremia: Secondary | ICD-10-CM | POA: Diagnosis not present

## 2014-02-24 DIAGNOSIS — R7881 Bacteremia: Secondary | ICD-10-CM | POA: Diagnosis not present

## 2014-03-04 ENCOUNTER — Emergency Department (HOSPITAL_COMMUNITY)
Admission: EM | Admit: 2014-03-04 | Discharge: 2014-03-04 | Disposition: A | Discharge status: Home / Self Care | Payer: Medicare Other | Attending: Emergency Medicine | Admitting: Emergency Medicine

## 2014-03-04 ENCOUNTER — Encounter (HOSPITAL_COMMUNITY): Payer: Self-pay | Admitting: Emergency Medicine

## 2014-03-04 DIAGNOSIS — IMO0001 Reserved for inherently not codable concepts without codable children: Secondary | ICD-10-CM | POA: Diagnosis not present

## 2014-03-04 DIAGNOSIS — Z79899 Other long term (current) drug therapy: Secondary | ICD-10-CM | POA: Insufficient documentation

## 2014-03-04 DIAGNOSIS — M79609 Pain in unspecified limb: Secondary | ICD-10-CM

## 2014-03-04 DIAGNOSIS — I82629 Acute embolism and thrombosis of deep veins of unspecified upper extremity: Secondary | ICD-10-CM

## 2014-03-04 DIAGNOSIS — I82621 Acute embolism and thrombosis of deep veins of right upper extremity: Secondary | ICD-10-CM

## 2014-03-04 DIAGNOSIS — I10 Essential (primary) hypertension: Secondary | ICD-10-CM | POA: Diagnosis not present

## 2014-03-04 DIAGNOSIS — Z992 Dependence on renal dialysis: Secondary | ICD-10-CM | POA: Diagnosis not present

## 2014-03-04 DIAGNOSIS — I120 Hypertensive chronic kidney disease with stage 5 chronic kidney disease or end stage renal disease: Secondary | ICD-10-CM | POA: Insufficient documentation

## 2014-03-04 DIAGNOSIS — N186 End stage renal disease: Secondary | ICD-10-CM | POA: Insufficient documentation

## 2014-03-04 HISTORY — DX: Acute embolism and thrombosis of deep veins of unspecified upper extremity: I82.629

## 2014-03-04 MED ORDER — OXYCODONE-ACETAMINOPHEN 5-325 MG PO TABS: 1.0000 | ORAL_TABLET | ORAL | Status: DC | PRN

## 2014-03-04 MED ORDER — HYDROCODONE-ACETAMINOPHEN 5-325 MG PO TABS
1.0000 | ORAL_TABLET | Freq: Once | ORAL | Status: AC
  Administered 2014-03-04: 1 via ORAL
  Filled 2014-03-04: qty 1

## 2014-03-04 NOTE — ED Notes (Signed)
Dialysis pt, last treatment was yesterday, access is in left arm. Reports pain beginning to right arm and elbow on wed night, felt like he slept wrong on his arm, now has pain and swelling to elbow.

## 2014-03-04 NOTE — ED Notes (Signed)
Spoke with Vascular Lab, They are aware that pt. Needs his testing done.

## 2014-03-04 NOTE — ED Provider Notes (Signed)
Patient has end-stage renal disease and gets dialysis on Tuesday, Thursday (yesterday), and Saturday. He reports no problems with his dialysis yesterday. He states he he woke up 3 days ago and he was laying on his right side with his right arm underneath his body. He states he was having pain in the antecubital area of his right arm at that time. He reports pain when he tries to extend his elbow. He has not had fever or chills. He denies any other known injury to his arm. He states he's never had this before. Patient gets his dialysis and a graft in his left upper arm. Patient is right handed.  Patient is noted to have a faint patch of redness in the antecubital area medially in his right proximal forearm with tenderness in the same area. There is no masses felt. There does not appear to be induration of the subcutaneous tissue. There is no joint effusion noted. He is painful when I extend his elbow and pronate his hand. He appears to have good distal capillary refill. His hand is not cold.  Medical screening examination/treatment/procedure(s) were conducted as a shared visit with non-physician practitioner(s) and myself.  I personally evaluated the patient during the encounter.   EKG Interpretation None       Rolland Porter, MD, Abram Sander   Janice Norrie, MD 03/04/14 1040

## 2014-03-04 NOTE — Progress Notes (Signed)
*  PRELIMINARY RESULTS* Vascular Ultrasound Right upper extremity venous duplex has been completed.  Preliminary findings: Evidence of DVT involving the right radial veins.  Landry Mellow, RDMS, RVT  03/04/2014, 3:25 PM

## 2014-03-04 NOTE — ED Provider Notes (Signed)
CSN: [id number]     Arrival date & time 03/04/14  [id number] History   First MD Initiated Contact with Patient 03/04/14 1009     Chief Complaint  Patient presents with  . Arm Pain     (Consider location/radiation/quality/duration/timing/severity/associated sxs/prior Treatment) HPI  48 year old male who is a dialysis patient, Tuesday Thursday Saturday with an AV fistula to his left arm presents for evaluation of right arm pain. Patient states 3 days ago he may have slept wrong when he found himself walking up in the middle of the night with sharp throbbing pain to his right proximal forearm. Pain has been persistent, 9/10, nonradiating, worsening with palpation or movement and not improved with using both heat and ice.  He did not sleep on his affected on last night but that the pain still persist. He has a normal dialysis session yesterday. He continued to endorse pain and now swelling. He denies having any fever, chills, tingling sensation, numbness to the affected arm. Denies any recent injury. Denies any IV drug use, and no prior history of PE or DVT these patient cannot recall. No history of active cancer, patient is not on any blood thinner medication. No compressive chest pain, shortness of breath, or productive cough.  Past Medical History  Diagnosis Date  . Hypertension   . Peripheral vascular disease   . Renal disorder     [place],Dialysis- T,Th,S   Past Surgical History  Procedure Laterality Date  . Av fistula placement Left     forearm   . Shuntogram Left November 04, 2011  . Patch angioplasty Left 07/23/2013    Procedure: PATCH ANGIOPLASTY- LEFT RADIOCEPHALIC ARTERIOVENOUS FISTULA;  Surgeon: Angelia Mould, MD;  Location: [facility] OR;  Service: Vascular;  Laterality: Left;   Family History  Problem Relation Age of Onset  . Diabetes Mother   . Hypertension Mother   . Hypertension Father   . Hypertension Sister   . Hypertension Brother    History  Substance Use Topics  .  Smoking status: Never Smoker   . Smokeless tobacco: Never Used  . Alcohol Use: Yes    Review of Systems  Constitutional: Negative for fever.  Musculoskeletal: Positive for myalgias.  Neurological: Negative for numbness.      Allergies  Review of patient's allergies indicates no known allergies.  Home Medications   Prior to Admission medications   Medication Sig Start Date End Date Taking? Authorizing Provider  cinacalcet (SENSIPAR) 60 MG tablet Take 60 mg by mouth daily.    Historical Provider, MD  lisinopril (PRINIVIL,ZESTRIL) 10 MG tablet Take 10 mg by mouth daily.    Historical Provider, MD  oxyCODONE (ROXICODONE) 5 MG immediate release tablet Take 1 tablet (5 mg total) by mouth every 6 (six) hours as needed for severe pain. 07/23/13   Samantha J Rhyne, PA-C  sevelamer carbonate (RENVELA) 800 MG tablet Take 800 mg by mouth 3 (three) times daily with meals.    Historical Provider, MD   BP 129/37  Pulse 94  Temp(Src) 98.1 F (36.7 C) (Oral)  Resp 18  SpO2 98% Physical Exam  Constitutional: He appears well-developed and well-nourished. No distress.  HENT:  Head: Atraumatic.  Eyes: Conjunctivae are normal.  Neck: Normal range of motion. Neck supple.  Musculoskeletal: He exhibits tenderness (tenderness noted to volar region of proximal right forearm on palpation with mild overlying skin erythema and warmth. Increasing pain with range of motion, no significant tenderness to elbow joint, shoulder joint, or wrist.).  R  forearm compartment is soft, no pitting edema, radial pulse difficult to palpate however brisk cap refills to distal fingers with normal grip strength.    Neurological: He is alert.  Skin: No rash noted.  Left AV fistula with palpable thrills, no signs of infection  Psychiatric: He has a normal mood and affect.    ED Course  Procedures (including critical care time)  Patient without any significant history concerning for PE or DVT presents with right proximal  forearm tenderness. On exam, patient has some mild erythema and warmth to his proximal forearm which may indicate early cellulitis. No obvious palpable cords or edema noted. However will obtain vascular ultrasound to rule out DVT. If negative, will consider treating symptomatically. Care discussed with DR. Knapp  4:12 PM Vascular ultrasound of right arm demonstrate a DVT involving the right radial veins. Since patient is a dialysis patient, I'll consult with nephrologist for recommendation in regards to anticoagulant.  Care discussed with oncoming provider who will dispo pt once consulted with nephrologist.  Pt may benefit from additional pain management once d/c.    Yashar, Hemme Male 07/23/1965 [id number]            Progress Notes by Chapman Fitch at 03/04/2014 3:24 PM    Author: Chapman Fitch Service: Vascular Lab Author Type: Cardiovascular Sonographer   Filed: 03/04/2014 3:25 PM Note Time: 03/04/2014 3:24 PM Status: Signed   Editor: Chapman Fitch (Cardiovascular Sonographer)      *PRELIMINARY RESULTS*  Vascular Ultrasound  Right upper extremity venous duplex has been completed. Preliminary findings: Evidence of DVT involving the right radial veins.  Landry Mellow, RDMS, RVT  03/04/2014, 3:25 PM         Labs Review Labs Reviewed - No data to display  Imaging Review No results found.   EKG Interpretation None      MDM   Final diagnoses:  Arm DVT (deep venous thromboembolism), acute, right        Domenic Moras, PA-C 03/04/14 1654

## 2014-03-04 NOTE — Discharge Instructions (Signed)

## 2014-03-04 NOTE — ED Notes (Signed)
Re-paged Dr. Melvia Heaps to [phone]

## 2014-03-04 NOTE — ED Provider Notes (Signed)
Patient signed out from Domenic Moras, [place].  Dialysis pt, new DVT R forearm involv radial veins. We are currently awaiting return call from nephrology to request appropriate anticoagulant.   Dr Joneen Caraway with nephrology returned call and stated that anticoagulation therapy was not recommended at this time. We spoke with vascular surgery, who gave the same recommendation that do to the location and nature of patient's DVT based on the results of the study, anticoagulation therapy is not warranted at this time. Patient discharged to home to followup with PCP, nephrology.    Signed,  Dahlia Bailiff, PA-C 2:12 AM  This patient seen and discussed with Dr. Virgel Manifold, M.D.  Carrie Mew, PA-C 03/05/14 [phone]

## 2014-03-07 DIAGNOSIS — N186 End stage renal disease: Secondary | ICD-10-CM | POA: Diagnosis not present

## 2014-03-07 NOTE — ED Provider Notes (Signed)
See prior note   Janice Norrie, MD 03/07/14 1445

## 2014-03-08 DIAGNOSIS — N2581 Secondary hyperparathyroidism of renal origin: Secondary | ICD-10-CM | POA: Diagnosis not present

## 2014-03-08 DIAGNOSIS — R7881 Bacteremia: Secondary | ICD-10-CM | POA: Diagnosis not present

## 2014-03-08 DIAGNOSIS — D631 Anemia in chronic kidney disease: Secondary | ICD-10-CM | POA: Diagnosis not present

## 2014-03-08 DIAGNOSIS — N039 Chronic nephritic syndrome with unspecified morphologic changes: Secondary | ICD-10-CM | POA: Diagnosis not present

## 2014-03-08 DIAGNOSIS — N186 End stage renal disease: Secondary | ICD-10-CM | POA: Diagnosis not present

## 2014-03-08 NOTE — ED Provider Notes (Signed)
Medical screening examination/treatment/procedure(s) were conducted as a shared visit with non-physician practitioner(s) and myself.  I personally evaluated the patient during the encounter.   EKG Interpretation None     Pt with DVT but upper extremity and distal. Discussed with both nephrology and vascular surgery. Neither recommending anticoagulation at this time. Symptomatic tx. Would follow clinically with symptoms/exam and follow-up with surveillance [place] for possible propagation if symptoms worsen or fail to improve. Discussed plan and return precautions with pt.   Virgel Manifold, MD 03/08/14 [phone]

## 2014-03-10 DIAGNOSIS — D631 Anemia in chronic kidney disease: Secondary | ICD-10-CM | POA: Diagnosis not present

## 2014-03-10 DIAGNOSIS — R7881 Bacteremia: Secondary | ICD-10-CM | POA: Diagnosis not present

## 2014-03-10 DIAGNOSIS — N186 End stage renal disease: Secondary | ICD-10-CM | POA: Diagnosis not present

## 2014-03-10 DIAGNOSIS — N2581 Secondary hyperparathyroidism of renal origin: Secondary | ICD-10-CM | POA: Diagnosis not present

## 2014-03-12 DIAGNOSIS — R7881 Bacteremia: Secondary | ICD-10-CM | POA: Diagnosis not present

## 2014-03-12 DIAGNOSIS — D631 Anemia in chronic kidney disease: Secondary | ICD-10-CM | POA: Diagnosis not present

## 2014-03-12 DIAGNOSIS — N186 End stage renal disease: Secondary | ICD-10-CM | POA: Diagnosis not present

## 2014-03-12 DIAGNOSIS — N2581 Secondary hyperparathyroidism of renal origin: Secondary | ICD-10-CM | POA: Diagnosis not present

## 2014-03-15 DIAGNOSIS — N2581 Secondary hyperparathyroidism of renal origin: Secondary | ICD-10-CM | POA: Diagnosis not present

## 2014-03-15 DIAGNOSIS — R7881 Bacteremia: Secondary | ICD-10-CM | POA: Diagnosis not present

## 2014-03-15 DIAGNOSIS — N186 End stage renal disease: Secondary | ICD-10-CM | POA: Diagnosis not present

## 2014-03-15 DIAGNOSIS — D631 Anemia in chronic kidney disease: Secondary | ICD-10-CM | POA: Diagnosis not present

## 2014-03-17 DIAGNOSIS — D631 Anemia in chronic kidney disease: Secondary | ICD-10-CM | POA: Diagnosis not present

## 2014-03-17 DIAGNOSIS — R7881 Bacteremia: Secondary | ICD-10-CM | POA: Diagnosis not present

## 2014-03-17 DIAGNOSIS — N2581 Secondary hyperparathyroidism of renal origin: Secondary | ICD-10-CM | POA: Diagnosis not present

## 2014-03-17 DIAGNOSIS — N186 End stage renal disease: Secondary | ICD-10-CM | POA: Diagnosis not present

## 2014-03-17 DIAGNOSIS — N039 Chronic nephritic syndrome with unspecified morphologic changes: Secondary | ICD-10-CM | POA: Diagnosis not present

## 2014-03-19 DIAGNOSIS — D631 Anemia in chronic kidney disease: Secondary | ICD-10-CM | POA: Diagnosis not present

## 2014-03-19 DIAGNOSIS — R7881 Bacteremia: Secondary | ICD-10-CM | POA: Diagnosis not present

## 2014-03-19 DIAGNOSIS — N039 Chronic nephritic syndrome with unspecified morphologic changes: Secondary | ICD-10-CM | POA: Diagnosis not present

## 2014-03-19 DIAGNOSIS — N186 End stage renal disease: Secondary | ICD-10-CM | POA: Diagnosis not present

## 2014-03-19 DIAGNOSIS — N2581 Secondary hyperparathyroidism of renal origin: Secondary | ICD-10-CM | POA: Diagnosis not present

## 2014-03-21 DIAGNOSIS — M25562 Pain in left knee: Secondary | ICD-10-CM | POA: Diagnosis present

## 2014-03-21 DIAGNOSIS — I5041 Acute combined systolic (congestive) and diastolic (congestive) heart failure: Secondary | ICD-10-CM | POA: Diagnosis present

## 2014-03-21 DIAGNOSIS — I330 Acute and subacute infective endocarditis: Secondary | ICD-10-CM

## 2014-03-21 HISTORY — DX: Acute combined systolic (congestive) and diastolic (congestive) heart failure: I50.41

## 2014-03-21 HISTORY — DX: Acute and subacute infective endocarditis: I33.0

## 2014-03-22 ENCOUNTER — Encounter (HOSPITAL_COMMUNITY): Payer: Self-pay | Admitting: Emergency Medicine

## 2014-03-22 ENCOUNTER — Inpatient Hospital Stay (HOSPITAL_COMMUNITY)
Admission: EM | Admit: 2014-03-22 | Discharge: 2014-04-15 | DRG: 853 | Disposition: A | Discharge status: Home / Self Care | Payer: Medicare Other | Attending: Thoracic Surgery (Cardiothoracic Vascular Surgery) | Admitting: Thoracic Surgery (Cardiothoracic Vascular Surgery)

## 2014-03-22 ENCOUNTER — Emergency Department (HOSPITAL_COMMUNITY): Payer: Medicare Other

## 2014-03-22 DIAGNOSIS — Z86718 Personal history of other venous thrombosis and embolism: Secondary | ICD-10-CM

## 2014-03-22 DIAGNOSIS — Y92239 Unspecified place in hospital as the place of occurrence of the external cause: Secondary | ICD-10-CM

## 2014-03-22 DIAGNOSIS — D6959 Other secondary thrombocytopenia: Secondary | ICD-10-CM | POA: Diagnosis not present

## 2014-03-22 DIAGNOSIS — X58XXXA Exposure to other specified factors, initial encounter: Secondary | ICD-10-CM | POA: Diagnosis present

## 2014-03-22 DIAGNOSIS — I739 Peripheral vascular disease, unspecified: Secondary | ICD-10-CM | POA: Diagnosis present

## 2014-03-22 DIAGNOSIS — R509 Fever, unspecified: Secondary | ICD-10-CM

## 2014-03-22 DIAGNOSIS — D735 Infarction of spleen: Secondary | ICD-10-CM | POA: Diagnosis not present

## 2014-03-22 DIAGNOSIS — R609 Edema, unspecified: Secondary | ICD-10-CM | POA: Diagnosis not present

## 2014-03-22 DIAGNOSIS — I519 Heart disease, unspecified: Secondary | ICD-10-CM | POA: Diagnosis not present

## 2014-03-22 DIAGNOSIS — I5031 Acute diastolic (congestive) heart failure: Secondary | ICD-10-CM | POA: Diagnosis not present

## 2014-03-22 DIAGNOSIS — Z4682 Encounter for fitting and adjustment of non-vascular catheter: Secondary | ICD-10-CM | POA: Diagnosis not present

## 2014-03-22 DIAGNOSIS — S99919A Unspecified injury of unspecified ankle, initial encounter: Secondary | ICD-10-CM | POA: Diagnosis not present

## 2014-03-22 DIAGNOSIS — N2581 Secondary hyperparathyroidism of renal origin: Secondary | ICD-10-CM | POA: Diagnosis present

## 2014-03-22 DIAGNOSIS — R079 Chest pain, unspecified: Secondary | ICD-10-CM | POA: Diagnosis not present

## 2014-03-22 DIAGNOSIS — R6520 Severe sepsis without septic shock: Secondary | ICD-10-CM | POA: Diagnosis not present

## 2014-03-22 DIAGNOSIS — Y838 Other surgical procedures as the cause of abnormal reaction of the patient, or of later complication, without mention of misadventure at the time of the procedure: Secondary | ICD-10-CM | POA: Diagnosis not present

## 2014-03-22 DIAGNOSIS — J9601 Acute respiratory failure with hypoxia: Secondary | ICD-10-CM | POA: Diagnosis not present

## 2014-03-22 DIAGNOSIS — R001 Bradycardia, unspecified: Secondary | ICD-10-CM | POA: Diagnosis not present

## 2014-03-22 DIAGNOSIS — M25469 Effusion, unspecified knee: Secondary | ICD-10-CM | POA: Diagnosis not present

## 2014-03-22 DIAGNOSIS — Z7982 Long term (current) use of aspirin: Secondary | ICD-10-CM | POA: Diagnosis not present

## 2014-03-22 DIAGNOSIS — B9561 Methicillin susceptible Staphylococcus aureus infection as the cause of diseases classified elsewhere: Secondary | ICD-10-CM

## 2014-03-22 DIAGNOSIS — Z833 Family history of diabetes mellitus: Secondary | ICD-10-CM | POA: Diagnosis not present

## 2014-03-22 DIAGNOSIS — R0989 Other specified symptoms and signs involving the circulatory and respiratory systems: Secondary | ICD-10-CM | POA: Diagnosis not present

## 2014-03-22 DIAGNOSIS — M25562 Pain in left knee: Secondary | ICD-10-CM | POA: Diagnosis present

## 2014-03-22 DIAGNOSIS — Z79899 Other long term (current) drug therapy: Secondary | ICD-10-CM

## 2014-03-22 DIAGNOSIS — Z954 Presence of other heart-valve replacement: Secondary | ICD-10-CM | POA: Diagnosis not present

## 2014-03-22 DIAGNOSIS — S76122A Laceration of left quadriceps muscle, fascia and tendon, initial encounter: Secondary | ICD-10-CM | POA: Diagnosis present

## 2014-03-22 DIAGNOSIS — R059 Cough, unspecified: Secondary | ICD-10-CM

## 2014-03-22 DIAGNOSIS — R0602 Shortness of breath: Secondary | ICD-10-CM | POA: Diagnosis not present

## 2014-03-22 DIAGNOSIS — IMO0002 Reserved for concepts with insufficient information to code with codable children: Secondary | ICD-10-CM | POA: Diagnosis not present

## 2014-03-22 DIAGNOSIS — J9600 Acute respiratory failure, unspecified whether with hypoxia or hypercapnia: Secondary | ICD-10-CM | POA: Diagnosis not present

## 2014-03-22 DIAGNOSIS — K8020 Calculus of gallbladder without cholecystitis without obstruction: Secondary | ICD-10-CM | POA: Diagnosis not present

## 2014-03-22 DIAGNOSIS — Z8249 Family history of ischemic heart disease and other diseases of the circulatory system: Secondary | ICD-10-CM

## 2014-03-22 DIAGNOSIS — I10 Essential (primary) hypertension: Secondary | ICD-10-CM | POA: Diagnosis not present

## 2014-03-22 DIAGNOSIS — R7881 Bacteremia: Secondary | ICD-10-CM | POA: Diagnosis not present

## 2014-03-22 DIAGNOSIS — I330 Acute and subacute infective endocarditis: Secondary | ICD-10-CM

## 2014-03-22 DIAGNOSIS — S86102A Unspecified injury of other muscle(s) and tendon(s) of posterior muscle group at lower leg level, left leg, initial encounter: Secondary | ICD-10-CM | POA: Diagnosis present

## 2014-03-22 DIAGNOSIS — A4101 Sepsis due to Methicillin susceptible Staphylococcus aureus: Secondary | ICD-10-CM | POA: Diagnosis not present

## 2014-03-22 DIAGNOSIS — J9589 Other postprocedural complications and disorders of respiratory system, not elsewhere classified: Secondary | ICD-10-CM | POA: Diagnosis not present

## 2014-03-22 DIAGNOSIS — I5043 Acute on chronic combined systolic (congestive) and diastolic (congestive) heart failure: Secondary | ICD-10-CM | POA: Diagnosis not present

## 2014-03-22 DIAGNOSIS — D631 Anemia in chronic kidney disease: Secondary | ICD-10-CM | POA: Diagnosis present

## 2014-03-22 DIAGNOSIS — E876 Hypokalemia: Secondary | ICD-10-CM | POA: Diagnosis not present

## 2014-03-22 DIAGNOSIS — I76 Septic arterial embolism: Secondary | ICD-10-CM | POA: Diagnosis not present

## 2014-03-22 DIAGNOSIS — E875 Hyperkalemia: Secondary | ICD-10-CM | POA: Diagnosis not present

## 2014-03-22 DIAGNOSIS — I272 Other secondary pulmonary hypertension: Secondary | ICD-10-CM | POA: Diagnosis present

## 2014-03-22 DIAGNOSIS — Z01818 Encounter for other preprocedural examination: Secondary | ICD-10-CM | POA: Diagnosis not present

## 2014-03-22 DIAGNOSIS — R9431 Abnormal electrocardiogram [ECG] [EKG]: Secondary | ICD-10-CM | POA: Diagnosis present

## 2014-03-22 DIAGNOSIS — M25569 Pain in unspecified knee: Secondary | ICD-10-CM | POA: Diagnosis not present

## 2014-03-22 DIAGNOSIS — F1290 Cannabis use, unspecified, uncomplicated: Secondary | ICD-10-CM | POA: Diagnosis present

## 2014-03-22 DIAGNOSIS — I5041 Acute combined systolic (congestive) and diastolic (congestive) heart failure: Secondary | ICD-10-CM

## 2014-03-22 DIAGNOSIS — A419 Sepsis, unspecified organism: Secondary | ICD-10-CM

## 2014-03-22 DIAGNOSIS — J189 Pneumonia, unspecified organism: Secondary | ICD-10-CM | POA: Diagnosis not present

## 2014-03-22 DIAGNOSIS — I509 Heart failure, unspecified: Secondary | ICD-10-CM

## 2014-03-22 DIAGNOSIS — Z9889 Other specified postprocedural states: Secondary | ICD-10-CM

## 2014-03-22 DIAGNOSIS — I059 Rheumatic mitral valve disease, unspecified: Secondary | ICD-10-CM | POA: Diagnosis not present

## 2014-03-22 DIAGNOSIS — K0530 Chronic periodontitis, unspecified: Secondary | ICD-10-CM | POA: Diagnosis present

## 2014-03-22 DIAGNOSIS — J90 Pleural effusion, not elsewhere classified: Secondary | ICD-10-CM | POA: Diagnosis not present

## 2014-03-22 DIAGNOSIS — I5032 Chronic diastolic (congestive) heart failure: Secondary | ICD-10-CM | POA: Diagnosis present

## 2014-03-22 DIAGNOSIS — I743 Embolism and thrombosis of arteries of the lower extremities: Secondary | ICD-10-CM | POA: Diagnosis present

## 2014-03-22 DIAGNOSIS — S8990XA Unspecified injury of unspecified lower leg, initial encounter: Secondary | ICD-10-CM | POA: Diagnosis not present

## 2014-03-22 DIAGNOSIS — Z992 Dependence on renal dialysis: Secondary | ICD-10-CM

## 2014-03-22 DIAGNOSIS — A4901 Methicillin susceptible Staphylococcus aureus infection, unspecified site: Secondary | ICD-10-CM | POA: Diagnosis not present

## 2014-03-22 DIAGNOSIS — Z4509 Encounter for adjustment and management of other cardiac device: Secondary | ICD-10-CM | POA: Diagnosis not present

## 2014-03-22 DIAGNOSIS — Y92234 Operating room of hospital as the place of occurrence of the external cause: Secondary | ICD-10-CM | POA: Diagnosis not present

## 2014-03-22 DIAGNOSIS — S76302A Unspecified injury of muscle, fascia and tendon of the posterior muscle group at thigh level, left thigh, initial encounter: Secondary | ICD-10-CM | POA: Diagnosis present

## 2014-03-22 DIAGNOSIS — R05 Cough: Secondary | ICD-10-CM | POA: Diagnosis not present

## 2014-03-22 DIAGNOSIS — J9819 Other pulmonary collapse: Secondary | ICD-10-CM | POA: Diagnosis not present

## 2014-03-22 DIAGNOSIS — J811 Chronic pulmonary edema: Secondary | ICD-10-CM | POA: Diagnosis not present

## 2014-03-22 DIAGNOSIS — Z7901 Long term (current) use of anticoagulants: Secondary | ICD-10-CM

## 2014-03-22 DIAGNOSIS — K5730 Diverticulosis of large intestine without perforation or abscess without bleeding: Secondary | ICD-10-CM | POA: Diagnosis not present

## 2014-03-22 DIAGNOSIS — I70219 Atherosclerosis of native arteries of extremities with intermittent claudication, unspecified extremity: Secondary | ICD-10-CM | POA: Diagnosis not present

## 2014-03-22 DIAGNOSIS — J9811 Atelectasis: Secondary | ICD-10-CM | POA: Diagnosis not present

## 2014-03-22 DIAGNOSIS — M7122 Synovial cyst of popliteal space [Baker], left knee: Secondary | ICD-10-CM | POA: Diagnosis present

## 2014-03-22 DIAGNOSIS — R0609 Other forms of dyspnea: Secondary | ICD-10-CM | POA: Diagnosis not present

## 2014-03-22 DIAGNOSIS — N186 End stage renal disease: Secondary | ICD-10-CM | POA: Diagnosis present

## 2014-03-22 DIAGNOSIS — M629 Disorder of muscle, unspecified: Secondary | ICD-10-CM | POA: Diagnosis not present

## 2014-03-22 DIAGNOSIS — J209 Acute bronchitis, unspecified: Secondary | ICD-10-CM | POA: Diagnosis present

## 2014-03-22 DIAGNOSIS — R918 Other nonspecific abnormal finding of lung field: Secondary | ICD-10-CM | POA: Diagnosis not present

## 2014-03-22 DIAGNOSIS — I340 Nonrheumatic mitral (valve) insufficiency: Secondary | ICD-10-CM

## 2014-03-22 DIAGNOSIS — I2510 Atherosclerotic heart disease of native coronary artery without angina pectoris: Secondary | ICD-10-CM | POA: Diagnosis not present

## 2014-03-22 DIAGNOSIS — I97190 Other postprocedural cardiac functional disturbances following cardiac surgery: Secondary | ICD-10-CM | POA: Diagnosis not present

## 2014-03-22 DIAGNOSIS — D62 Acute posthemorrhagic anemia: Secondary | ICD-10-CM | POA: Diagnosis not present

## 2014-03-22 DIAGNOSIS — D649 Anemia, unspecified: Secondary | ICD-10-CM | POA: Diagnosis not present

## 2014-03-22 DIAGNOSIS — I998 Other disorder of circulatory system: Secondary | ICD-10-CM | POA: Diagnosis not present

## 2014-03-22 DIAGNOSIS — M7989 Other specified soft tissue disorders: Secondary | ICD-10-CM | POA: Diagnosis not present

## 2014-03-22 DIAGNOSIS — S838X9A Sprain of other specified parts of unspecified knee, initial encounter: Secondary | ICD-10-CM | POA: Diagnosis not present

## 2014-03-22 DIAGNOSIS — Z01810 Encounter for preprocedural cardiovascular examination: Secondary | ICD-10-CM | POA: Diagnosis not present

## 2014-03-22 DIAGNOSIS — I120 Hypertensive chronic kidney disease with stage 5 chronic kidney disease or end stage renal disease: Secondary | ICD-10-CM | POA: Diagnosis present

## 2014-03-22 DIAGNOSIS — M259 Joint disorder, unspecified: Secondary | ICD-10-CM | POA: Diagnosis not present

## 2014-03-22 DIAGNOSIS — J984 Other disorders of lung: Secondary | ICD-10-CM | POA: Diagnosis not present

## 2014-03-22 DIAGNOSIS — B958 Unspecified staphylococcus as the cause of diseases classified elsewhere: Secondary | ICD-10-CM | POA: Diagnosis not present

## 2014-03-22 DIAGNOSIS — I279 Pulmonary heart disease, unspecified: Secondary | ICD-10-CM | POA: Diagnosis not present

## 2014-03-22 DIAGNOSIS — I97618 Postprocedural hemorrhage and hematoma of a circulatory system organ or structure following other circulatory system procedure: Secondary | ICD-10-CM | POA: Diagnosis not present

## 2014-03-22 DIAGNOSIS — S86819A Strain of other muscle(s) and tendon(s) at lower leg level, unspecified leg, initial encounter: Secondary | ICD-10-CM | POA: Diagnosis not present

## 2014-03-22 DIAGNOSIS — I5040 Unspecified combined systolic (congestive) and diastolic (congestive) heart failure: Secondary | ICD-10-CM | POA: Diagnosis not present

## 2014-03-22 HISTORY — DX: Infarction of spleen: D73.5

## 2014-03-22 HISTORY — DX: Other specified postprocedural states: Z98.890

## 2014-03-22 HISTORY — DX: Acute combined systolic (congestive) and diastolic (congestive) heart failure: I50.41

## 2014-03-22 HISTORY — DX: Dependence on renal dialysis: N18.6

## 2014-03-22 HISTORY — DX: Nonrheumatic mitral (valve) insufficiency: I34.0

## 2014-03-22 HISTORY — DX: Pneumonia, unspecified organism: J18.9

## 2014-03-22 HISTORY — DX: Acute and subacute infective endocarditis: I33.0

## 2014-03-22 HISTORY — DX: Dependence on renal dialysis: Z99.2

## 2014-03-22 HISTORY — DX: Chronic diastolic (congestive) heart failure: I50.32

## 2014-03-22 HISTORY — DX: Septic arterial embolism: I76

## 2014-03-22 HISTORY — DX: Acute embolism and thrombosis of deep veins of unspecified upper extremity: I82.629

## 2014-03-22 LAB — BASIC METABOLIC PANEL
ANION GAP: 13 (ref 5–15)
BUN: 12 mg/dL (ref 6–23)
CHLORIDE: 94 meq/L — AB (ref 96–112)
CO2: 33 meq/L — AB (ref 19–32)
Calcium: 9.8 mg/dL (ref 8.4–10.5)
Creatinine, Ser: 5.85 mg/dL — ABNORMAL HIGH (ref 0.50–1.35)
GFR calc non Af Amer: 10 mL/min — ABNORMAL LOW (ref >90)
GFR, EST AFRICAN AMERICAN: 12 mL/min — AB (ref >90)
Glucose, Bld: 105 mg/dL — ABNORMAL HIGH (ref 70–99)
Potassium: 3.6 mEq/L — ABNORMAL LOW (ref 3.7–5.3)
SODIUM: 140 meq/L (ref 137–147)

## 2014-03-22 LAB — CBC
HEMATOCRIT: 28.2 % — AB (ref 39.0–52.0)
Hemoglobin: 9.2 g/dL — ABNORMAL LOW (ref 13.0–17.0)
MCH: 30.3 pg (ref 26.0–34.0)
MCHC: 32.6 g/dL (ref 30.0–36.0)
MCV: 92.8 fL (ref 78.0–100.0)
Platelets: 215 10*3/uL (ref 150–400)
RBC: 3.04 MIL/uL — ABNORMAL LOW (ref 4.22–5.81)
RDW: 15.1 % (ref 11.5–15.5)
WBC: 12.3 10*3/uL — ABNORMAL HIGH (ref 4.0–10.5)

## 2014-03-22 LAB — I-STAT TROPONIN, ED: TROPONIN I, POC: 0.08 ng/mL (ref 0.00–0.08)

## 2014-03-22 LAB — PHOSPHORUS: PHOSPHORUS: 5.9 mg/dL — AB (ref 2.3–4.6)

## 2014-03-22 LAB — PRO B NATRIURETIC PEPTIDE: Pro B Natriuretic peptide (BNP): 30804 pg/mL — ABNORMAL HIGH (ref 0–125)

## 2014-03-22 LAB — MAGNESIUM: MAGNESIUM: 1.8 mg/dL (ref 1.5–2.5)

## 2014-03-22 LAB — TROPONIN I: Troponin I: <0.3 ng/mL (ref <0.30)

## 2014-03-22 LAB — TSH: TSH: 0.901 u[IU]/mL (ref 0.350–4.500)

## 2014-03-22 MED ORDER — SODIUM CHLORIDE 0.9 % IJ SOLN
3.0000 mL | Freq: Two times a day (BID) | INTRAMUSCULAR | Status: DC
  Administered 2014-03-23 – 2014-04-04 (×18): 3 mL via INTRAVENOUS

## 2014-03-22 MED ORDER — IOHEXOL 350 MG/ML SOLN
80.0000 mL | Freq: Once | INTRAVENOUS | Status: AC | PRN
  Administered 2014-03-22: 80 mL via INTRAVENOUS

## 2014-03-22 MED ORDER — ACETAMINOPHEN 325 MG PO TABS
650.0000 mg | ORAL_TABLET | Freq: Once | ORAL | Status: AC
  Administered 2014-03-22: 650 mg via ORAL
  Filled 2014-03-22: qty 2

## 2014-03-22 MED ORDER — SODIUM CHLORIDE 0.9 % IJ SOLN: 3.0000 mL | INTRAMUSCULAR | Status: DC | PRN

## 2014-03-22 MED ORDER — SODIUM CHLORIDE 0.9 % IJ SOLN
3.0000 mL | Freq: Two times a day (BID) | INTRAMUSCULAR | Status: DC
  Administered 2014-03-23 – 2014-03-30 (×13): 3 mL via INTRAVENOUS

## 2014-03-22 MED ORDER — VANCOMYCIN HCL IN DEXTROSE 1-5 GM/200ML-% IV SOLN
1000.0000 mg | INTRAVENOUS | Status: DC
Start: 2014-03-24 — End: 2014-03-24
  Filled 2014-03-22 (×2): qty 200

## 2014-03-22 MED ORDER — DEXTROSE 5 % IV SOLN
2.0000 g | Freq: Once | INTRAVENOUS | Status: AC
  Administered 2014-03-22: 2 g via INTRAVENOUS
  Filled 2014-03-22: qty 2

## 2014-03-22 MED ORDER — SEVELAMER CARBONATE 800 MG PO TABS
800.0000 mg | ORAL_TABLET | Freq: Three times a day (TID) | ORAL | Status: DC
  Administered 2014-03-23 – 2014-04-04 (×30): 800 mg via ORAL
  Filled 2014-03-22 (×45): qty 1

## 2014-03-22 MED ORDER — POTASSIUM CHLORIDE CRYS ER 20 MEQ PO TBCR
20.0000 meq | EXTENDED_RELEASE_TABLET | Freq: Once | ORAL | Status: AC
Start: 2014-03-22 — End: 2014-03-22
  Administered 2014-03-22: 20 meq via ORAL
  Filled 2014-03-22: qty 1

## 2014-03-22 MED ORDER — OXYCODONE-ACETAMINOPHEN 5-325 MG PO TABS
1.0000 | ORAL_TABLET | ORAL | Status: DC | PRN
  Administered 2014-03-23: 1 via ORAL
  Filled 2014-03-22: qty 2

## 2014-03-22 MED ORDER — SODIUM CHLORIDE 0.9 % IV SOLN
250.0000 mL | INTRAVENOUS | Status: DC | PRN
  Administered 2014-03-25 (×4): via INTRAVENOUS

## 2014-03-22 MED ORDER — HEPARIN SODIUM (PORCINE) 5000 UNIT/ML IJ SOLN
5000.0000 [IU] | Freq: Three times a day (TID) | INTRAMUSCULAR | Status: DC
  Administered 2014-03-22 – 2014-03-28 (×12): 5000 [IU] via SUBCUTANEOUS
  Filled 2014-03-22 (×18): qty 1

## 2014-03-22 MED ORDER — DEXTROSE 5 % IV SOLN: 2.0000 g | INTRAVENOUS | Status: DC

## 2014-03-22 MED ORDER — VANCOMYCIN HCL 1000 MG IV SOLR: 15.0000 mg/kg | Freq: Once | INTRAVENOUS | Status: DC

## 2014-03-22 MED ORDER — VANCOMYCIN HCL 10 G IV SOLR
1750.0000 mg | Freq: Once | INTRAVENOUS | Status: AC
  Administered 2014-03-22: 1750 mg via INTRAVENOUS
  Filled 2014-03-22: qty 1750

## 2014-03-22 MED ORDER — LISINOPRIL 20 MG PO TABS
20.0000 mg | ORAL_TABLET | Freq: Every day | ORAL | Status: DC
  Administered 2014-03-23 – 2014-03-28 (×6): 20 mg via ORAL
  Filled 2014-03-22 (×7): qty 1

## 2014-03-22 NOTE — ED Notes (Signed)
Admitting at the bedside.  

## 2014-03-22 NOTE — ED Notes (Signed)
Pt sts he woke up feeling sob then he went to dialysis this morning then half way through his treatment he started to feel worse but was able to finish entire treatment. Reports he was sent over here after his treatment because he still wasn't feeling good. Denies missing recent dialysis appointment. sts he has been urinating less than normal. C/o productive cough x 3 weeks with yellow and white mucus. Nad, skin warm and dry, resp e/u.

## 2014-03-22 NOTE — H&P (Signed)
Date: 03/22/2014               Patient Name:  Timothy Palmer MRN: [id number]  DOB: [birth year] Age / Sex: 48 y.o., male   PCP: Louis Meckel, MD         Medical Service: Internal Medicine Teaching Service         Attending Physician: Dr. Aldine Contes, MD    First Contact: Dahlia Bailiff, MS-IV Pager: [phone]  Second Contact: Dr. Eulas Post Pager: [phone]       After Hours (After 5p/  First Contact Pager: [phone]  weekends / holidays): Second Contact Pager: [phone]   Chief Complaint: SOB and cough  History of Present Illness:  48yo M w/ PMH HTN and ESRD on HD presents with a cough with yellow sputum x3 weeks and increasing SOB. Pt alssendorses DOE and increasing orthopnea, sleeping on 2 pillows, but with increasing SOB and cough lying down. He denies any fevers, chest pain, palpitations, vomiting, abd pain, or changes in bowel habits. He does endorse some chills yesterday. Despite his respiratory symptoms he is still able to perform his normal daily activities, including mowing the lawn, which he did yesterday. Of note, while mowing the lawn, he felt a pop behind his left knee and has been having pain with ambulation in the knee since but no swelling.  Meds: Current Facility-Administered Medications  Medication Dose Route Frequency Provider Last Rate Last Dose  . 0.9 %  sodium chloride infusion  250 mL Intravenous PRN Otho Bellows, MD      . Derrill Memo ON 03/24/2014] ceFEPIme (MAXIPIME) 2 g in dextrose 5 % 50 mL IVPB  2 g Intravenous Q T,Th,Sa-HD Ephraim Hamburger, MD      . heparin injection 5,000 Units  5,000 Units Subcutaneous 3 times per day Otho Bellows, MD      . lisinopril (PRINIVIL,ZESTRIL) tablet 20 mg  20 mg Oral Daily Otho Bellows, MD      . oxyCODONE-acetaminophen (PERCOCET/ROXICET) 5-325 MG per tablet 1-2 tablet  1-2 tablet Oral Q4H PRN Otho Bellows, MD      . Derrill Memo ON 03/23/2014] sevelamer carbonate (RENVELA) tablet 800 mg  800 mg Oral TID WC Otho Bellows,  MD      . sodium chloride 0.9 % injection 3 mL  3 mL Intravenous Q12H Otho Bellows, MD      . sodium chloride 0.9 % injection 3 mL  3 mL Intravenous Q12H Otho Bellows, MD      . sodium chloride 0.9 % injection 3 mL  3 mL Intravenous PRN Otho Bellows, MD        Allergies: Allergies as of 03/22/2014  . (No Known Allergies)   Past Medical History  Diagnosis Date  . Hypertension   . Peripheral vascular disease   . Renal disorder     [place],Dialysis- T,Th,S  . Renal insufficiency    Past Surgical History  Procedure Laterality Date  . Av fistula placement Left     forearm   . Shuntogram Left November 04, 2011  . Patch angioplasty Left 07/23/2013    Procedure: PATCH ANGIOPLASTY- LEFT RADIOCEPHALIC ARTERIOVENOUS FISTULA;  Surgeon: Angelia Mould, MD;  Location: [facility] OR;  Service: Vascular;  Laterality: Left;   Family History  Problem Relation Age of Onset  . Diabetes Mother   . Hypertension Mother   . Hypertension Father   . Hypertension Sister   . Hypertension Brother  History   Social History  . Marital Status: Single    Spouse Name: N/A    Number of Children: N/A  . Years of Education: N/A   Occupational History  . Not on file.   Social History Main Topics  . Smoking status: Never Smoker   . Smokeless tobacco: Never Used  . Alcohol Use: Yes  . Drug Use: Yes    Special: Marijuana     Comment: last used 03/20/14  . Sexual Activity: Not on file   Other Topics Concern  . Not on file   Social History Narrative  . No narrative on file    Review of Systems: A 12 point ROS was performed; pertinent positives and negatives were noted in the HPI   Physical Exam: Blood pressure 147/94, pulse 100, temperature 98.6 F (37 C), temperature source Oral, resp. rate 20, SpO2 92.00%. General: Alert & oriented x 3, well-developed, and cooperative on examination.  Head: Normocephalic and atraumatic.  Eyes: Vision grossly intact, pupils equal, round, and  reactive to light.  Mouth: Pharynx pink and moist, no exudates.  Neck: Supple, full ROM, no JVD.  Lungs: CTAB, normal respiratory effort, no accessory muscle use, no crackles, and no wheezes. Heart: Mild tachycardia, regular rhythm, 3/6 murmur  Abdomen: Soft, non-tender, non-distended, hypoactive bowel sounds Msk: No joint swelling, warmth, or erythema.  Extremities: 1+ DP pulses bilaterally. AVF in LUE bandages, unable to palpate thrill over bandages. Neurologic: Alert & oriented X3, cranial nerves II-XII intact, strength normal in all extremities, sensation intact to light touch. Gait not observed.  Skin: Appears dry, no rashes.  Psych: Memory intact for recent and remote, normally interactive, good eye contact.   Lab results: Basic Metabolic Panel:  Recent Labs  03/22/14 1142 03/22/14 1349  NA 140  --   K 3.6*  --   CL 94*  --   CO2 33*  --   GLUCOSE 105*  --   BUN 12  --   CREATININE 5.85*  --   CALCIUM 9.8  --   MG  --  1.8   Liver Function Tests: No results found for this basename: AST, ALT, ALKPHOS, BILITOT, PROT, ALBUMIN,  in the last 72 hours No results found for this basename: LIPASE, AMYLASE,  in the last 72 hours No results found for this basename: AMMONIA,  in the last 72 hours CBC:  Recent Labs  03/22/14 1142  WBC 12.3*  HGB 9.2*  HCT 28.2*  MCV 92.8  PLT 215   Cardiac Enzymes: No results found for this basename: CKTOTAL, CKMB, CKMBINDEX, TROPONINI,  in the last 72 hours BNP:  Recent Labs  03/22/14 1142  PROBNP 30804.0*   D-Dimer: No results found for this basename: DDIMER,  in the last 72 hours CBG: No results found for this basename: GLUCAP,  in the last 72 hours Hemoglobin A1C: No results found for this basename: HGBA1C,  in the last 72 hours Fasting Lipid Panel: No results found for this basename: CHOL, HDL, LDLCALC, TRIG, CHOLHDL, LDLDIRECT,  in the last 72 hours Thyroid Function Tests: No results found for this basename: TSH, T4TOTAL,  FREET4, T3FREE, THYROIDAB,  in the last 72 hours Anemia Panel: No results found for this basename: VITAMINB12, FOLATE, FERRITIN, TIBC, IRON, RETICCTPCT,  in the last 72 hours Coagulation: No results found for this basename: LABPROT, INR,  in the last 72 hours Urine Drug Screen: Drugs of Abuse  No results found for this basename: labopia,  cocainscrnur,  labbenz,  amphetmu,  thcu,  labbarb    Alcohol Level: No results found for this basename: ETH,  in the last 72 hours Urinalysis: No results found for this basename: COLORURINE, APPERANCEUR, LABSPEC, [place], GLUCOSEU, HGBUR, BILIRUBINUR, KETONESUR, PROTEINUR, UROBILINOGEN, NITRITE, LEUKOCYTESUR,  in the last 72 hours   Imaging results:  Dg Chest 2 View  03/22/2014   CLINICAL DATA:  Shortness of breath  EXAM: CHEST  2 VIEW  COMPARISON:  07/23/2013  FINDINGS: There is no focal parenchymal opacity, pleural effusion, or pneumothorax. Mild pulmonary vascular congestion. There is mild cardiomegaly.  The osseous structures are unremarkable.  IMPRESSION: Mild pulmonary vascular congestion. Otherwise no active cardiopulmonary disease.   Electronically Signed   By: Kathreen Devoid   On: 03/22/2014 12:14   Ct Angio Chest W/cm &/or Wo Cm  03/22/2014   CLINICAL DATA:  Short of breath.  Cough.  EXAM: CT ANGIOGRAPHY CHEST WITH CONTRAST  TECHNIQUE: Multidetector CT imaging of the chest was performed using the standard protocol during bolus administration of intravenous contrast. Multiplanar CT image reconstructions and MIPs were obtained to evaluate the vascular anatomy.  CONTRAST:  3mL OMNIPAQUE IOHEXOL 350 MG/ML SOLN  COMPARISON:  09/12/2012  FINDINGS: No evidence of a pulmonary embolus.  Heart is mildly enlarged. There are minor coronary artery calcifications. Great vessels are normal in caliber.  There is mediastinal adenopathy. Multiple reference measurements were made. There is a 14 mm short axis node in the right superior mediastinum. There is a 19 mm short  axis prevascular lymph node adjacent the left pulmonary artery. There is a 15 mm right subcarinal lymph node. There are several other prominent to mildly enlarged lymph nodes. Although there were prominent nodes previously, the larger nodes have increased in size. There are prominent hilar lymph nodes not well defined, and without change.  Small right pleural effusion.  No left effusion.  Stable calcified pleural plaque over the right hemidiaphragm.  Lungs show mild diffuse interstitial thickening and hazy and somewhat heterogeneous airspace opacities which have a central to upper lobe predominance. Interstitial thickening is greatest in the lung bases.  Limited evaluation of the upper abdomen shows gallstones but is otherwise unremarkable.  No osteoblastic or osteolytic lesions.  Review of the MIP images confirms the above findings.  IMPRESSION: 1. No evidence of a pulmonary embolism. 2. Mild cardiomegaly, interstitial thickening and hazy areas of airspace lung opacity, as well as a small right effusion, all suggests congestive heart failure. However, there also prominent and mildly enlarged mediastinal lymph nodes. These are likely reactive. The possibility of lung infection should be considered in the proper clinical setting.   Electronically Signed   By: Lajean Manes M.D.   On: 03/22/2014 13:09    Other results: EKG: Sinus rhythm. Normal axis. Diffuse T wave inversions, QTc prolongation when compared to previous EKG.  Assessment & Plan by Problem: 48yo M w/ PMH HTN and ESRD on HD presents to the ED with increasing SOB in the setting of a productive cough and fever.  Acute bronchitis vs PNA: Pt with productive cough with yellow sputum x 3 week with increased SOB and chills at home. Temp 101 in the ED. Mild leukocytosis to 12.3. CXR with mild vascular congestion. Pt does receive HD and could possibly have pneumonia that did not show up on CXR. PE is also a possibility as he was recently diagnosed with a  RUE DVT and anticoagulation was not started at that time, per Nephrology. However, CTA was negative for PE but  did show mediastinal lymphadenopathy, which could be in the setting of a lung infection. Vanc and Cefepime started in teh ED.  - Admit to IMTS to tele - Continue Vanc/Cefepime for possible bronchitis vs HCAP given he is frequently in hemodialysis.  - Duonebs q6h - Blood cultures - Sputum culture and gram stain - Strep and legionella Ag - Respiratory viral panel - Checking HIV - CXR in AM  End stage renal disease on HD with possible volume overload: HD TThSa. Last dialysis today. Does not appear volume overload on exam, but CXR and CTA suggestive of congestive heart failure. Nephrology consulted. Will see how he does overnight, but if CXR in the am shows increased pulmonary edema, may need HD early.   - F/u with Nephrology - HD next on Thursday  T wave inversion in EKG: New diffuse T-wave inversions on EKG. Cardiology curbsided in teh ED and felt changes duer to electolyte abnormalities. K 3.6 and Mg 1.8. Replacing K. - KCl 6mEq x1 - EKG in AM - BMP in AM with Mg - Trending troponins q6 x3  QT prolongation: QTc >600 when previously normal. Holding QTc prolonging drugs.  - Will repeat EKG in AM.  Pain in popliteal fossa: Pt reports a popping sensation behind his left knee that occurred while mowing the grass yesterday. No swelling but pain with ambulation. No edema on edam, but tender to palpation at tendon insertion in popliteal fossa.  - MRI left knee to assess for soft tissue injury   Hypertension: Pt on ACEi at home. BP stable in the ED. Continuing Lisinopril 20mg  daily.   Dispo: Disposition is deferred at this time, awaiting improvement of current medical problems. Anticipated discharge in approximately 2-3 day(s).   The patient does have a current PCP (Louis Meckel, MD) and does not need an [facility] hospital follow-up appointment after discharge.  The patient does  not have transportation limitations that hinder transportation to clinic appointments.  Signed: Otho Bellows, MD 03/22/2014, 6:59 PM

## 2014-03-22 NOTE — Progress Notes (Signed)
VASCULAR LAB PRELIMINARY  PRELIMINARY  PRELIMINARY  PRELIMINARY  Left lower extremity venous duplex completed.    Preliminary report:  Left:  No evidence of DVT, superficial thrombosis, or Baker's cyst.  Monzerrath Mcburney, RVT 03/22/2014, 12:38 PM

## 2014-03-22 NOTE — Progress Notes (Signed)
ANTIBIOTIC CONSULT NOTE - INITIAL  Pharmacy Consult for Vancomyucin Indication: possible HCAP  No Known Allergies  Patient Measurements: Height: 5\' 11"  (180.3 cm) Weight: 223 lb 9.6 oz (101.424 kg) IBW/kg (Calculated) : 75.3   Vital Signs: Temp: 99 F (37.2 C) (09/15 2100) Temp src: Oral (09/15 2100) BP: 153/86 mmHg (09/15 2100) Pulse Rate: 105 (09/15 2100) Intake/Output from previous day:   Intake/Output from this shift: Total I/O In: 240 [P.O.:240] Out: -   Labs:  Recent Labs  03/22/14 1142  WBC 12.3*  HGB 9.2*  PLT 215  CREATININE 5.85*   Estimated Creatinine Clearance: 19.1 ml/min (by C-G formula based on Cr of 5.85). No results found for this basename: VANCOTROUGH, VANCOPEAK, VANCORANDOM, GENTTROUGH, GENTPEAK, GENTRANDOM, TOBRATROUGH, TOBRAPEAK, TOBRARND, AMIKACINPEAK, AMIKACINTROU, AMIKACIN,  in the last 72 hours   Microbiology: No results found for this or any previous visit (from the past 720 hour(s)).  Medical History: Past Medical History  Diagnosis Date  . Hypertension   . Peripheral vascular disease   . Renal disorder     [place],Dialysis- T,Th,S  . Renal insufficiency     Medications:  Prescriptions prior to admission  Medication Sig Dispense Refill  . cinacalcet (SENSIPAR) 60 MG tablet Take 60 mg by mouth daily.      Marland Kitchen lisinopril (PRINIVIL,ZESTRIL) 20 MG tablet Take 20 mg by mouth daily.      Marland Kitchen oxyCODONE-acetaminophen (PERCOCET/ROXICET) 5-325 MG per tablet Take 1-2 tablets by mouth every 4 (four) hours as needed for severe pain.  20 tablet  0  . sevelamer carbonate (RENVELA) 800 MG tablet Take 800 mg by mouth 3 (three) times daily with meals.       Scheduled:  . [START ON 03/24/2014] ceFEPime (MAXIPIME) IV  2 g Intravenous Q T,Th,Sa-HD  . heparin  5,000 Units Subcutaneous 3 times per day  . lisinopril  20 mg Oral Daily  . [START ON 03/23/2014] sevelamer carbonate  800 mg Oral TID WC  . sodium chloride  3 mL Intravenous Q12H  . sodium  chloride  3 mL Intravenous Q12H    Assessment: 48 y.o male with PMH of HTN, ESRD on HD p/w worsening cough, SOB * 1 day. He went to dialysis this AM and completed treatment.  He received Vancomycin 1750 mg IV in the ED today @ 15:16.  WBC 12.3K,  Tm 101 F and possible PNA on CT.  Pharmacy received consult this evening to adjust antibiotic dose based on patient's renal function.  Pharmacy previously consulted today to dose cefepime.  He received Vancomycin 1750 mg IV in the ED today @ 15:16 but no further MD orders for vancomycin or pharmacy protocol.  The IM progress notes indicate the patient is to continue on vancomycin and cefepime for possible PNA thus I will order vancomycin pharmacy protocol.    Goal of Therapy:  Pre hemodialysis vancomycin level = 15-25 mcg/ml  Plan:  Already received vancomycin 1750mg  IV dose today. Vancomycin 1000 mg IV after each HD (qTTSat)- to start with next HD.  Monitor clinical status and follow up culture results daily.  Monitor pre HD vancomycin level as needed per protocol.  Nicole Cella, RPh Clinical Pharmacist Pager: [phone] 03/22/2014,10:17 PM

## 2014-03-22 NOTE — Progress Notes (Signed)
ANTIBIOTIC CONSULT NOTE - INITIAL  Pharmacy Consult for cefepime Indication: r/o pneumonia  No Known Allergies  Patient Measurements:    Body Weight: 111.6 kg  Vital Signs: Temp: 101 F (38.3 C) (09/15 1341) Temp src: Oral (09/15 1341) BP: 144/74 mmHg (09/15 1341) Pulse Rate: 107 (09/15 1145) Intake/Output from previous day:   Intake/Output from this shift:    Labs:  Recent Labs  03/22/14 1142  WBC 12.3*  HGB 9.2*  PLT 215  CREATININE 5.85*   The CrCl is unknown because both a height and weight (above a minimum accepted value) are required for this calculation. No results found for this basename: VANCOTROUGH, VANCOPEAK, VANCORANDOM, GENTTROUGH, GENTPEAK, GENTRANDOM, TOBRATROUGH, TOBRAPEAK, TOBRARND, AMIKACINPEAK, AMIKACINTROU, AMIKACIN,  in the last 72 hours   Microbiology: No results found for this or any previous visit (from the past 720 hour(s)).  Medical History: Past Medical History  Diagnosis Date  . Hypertension   . Peripheral vascular disease   . Renal disorder     [place],Dialysis- T,Th,S  . Renal insufficiency     Medications:  See med history Assessment: 48 yo man c/o SOB.  He did go to dialysis this am and completed treatment.  He had a DVT diagnosed 2 weeks ago in RUE but did not receive any anticoagulant.  Also c/o pain in LLE.  Dopplers of LLE negative for DVT.  Plan CT to r/o PE.  Next HD will be on Thursday. CXR shows some mild pulmonary vascular congestion.  He was also ordered a dose of vancomycin in the ED  Goal of Therapy:  Appropriate antibiotic therapy  Plan:  Cefepime 2 gm IV now and after each HD. F/u cultures and clinical course  Thanks for allowing pharmacy to be a part of this patient's care.  Excell Seltzer, PharmD Clinical Pharmacist, [phone] 03/22/2014,1:49 PM

## 2014-03-22 NOTE — ED Notes (Signed)
Pt taken for radiology, CT, and vasular.

## 2014-03-22 NOTE — H&P (Addendum)
Pt seen and examined with Dr. Eulas Post. Please refer to resident note for details  In brief, pt is a 48 y/o make with PMH of HTN, ESRD on HD p/w worsening cough, SOB * 1 day. Pt states that approx 3 weeks ago he developed a cough productive of yellowish sputum assoc with SOB. Pt c/o progressively worsening DOE and mild orthopnea now using 2 pillows. No hemoptysis. Pt c/o chills, rigors today and had worsening cough and SOB post HD today and was sent here for further w/u. No CP, no abd pain, no n/v, no diarrhea, no lightheadedness, no syncope, no HA, no blurry vision, no focal weakness, no tingling/numbness. While mowing the lawn yesterday he noted a pop in his left knee and has had pain while ambulating. Remaining ROS negative  Exam: Cardio: RRR, systolic murmur + Lungs: CTA b/l Abd: soft, non tender, BS + Ext: no pedal edema Gen: AAO*3, NAD Neuro - no focal deficits  Assessment and Plan: 48 y.o male with PMH of HTN, ESRD on HD p/w worsening SOB, fever  Likely PNA v/s bronchitis: - CTA noted- No PE - c/w vanco, cefipime for possible PNA given fevers, +LN and possible PNA on CT - c/w nebs - f/u blood cx, sputum cx, gram stain, urinary legionella Ag, resp viral panel, HIV - repeat CXR in AM  ESRD on HD: - Nephrology to f/u - C/w HD per renal  EKG changes: - diffuse T wave inversions - trend troponins. Check repeat EKG in AM - supplement electrolytes - Would consult cardio if + CP/troponins - consider ECHO/myoview prior to d/c  Popliteal fossa pain: - check MRI knee  HTN: - c/w home meds  Dr Lynnae January to assume care in AM

## 2014-03-22 NOTE — ED Notes (Signed)
Pt declined having a rectal temp checked. PA made aware. No further orders received.

## 2014-03-22 NOTE — ED Provider Notes (Signed)
CSN: [id number]     Arrival date & time 03/22/14  1054 History   First MD Initiated Contact with Patient 03/22/14 1128     Chief Complaint  Patient presents with  . Shortness of Breath     (Consider location/radiation/quality/duration/timing/severity/associated sxs/prior Treatment) HPI Comments: Patient is a 48 year old male with a past medical history of hypertension, PVD and end-stage renal disease on dialysis (T, TH, Sat) who presents to the emergency department complaining of shortness of breath beginning yesterday evening. Shortness of breath worsened this morning when he woke up, went to dialysis and completed treatment. He reports during dialysis treatment he coughed up some mucus. States he's had a nonproductive cough for 3 weeks. Shortness of breath is both at rest and on exertion. States he feels very fatigued and weak. Denies chest pain, nausea, vomiting, diaphoresis, fever or chills. He was diagnosed with a right upper extremity DVT 2 weeks ago but was not started on anticoagulation. He is also endorsing left lower extremity pain from behind his knee radiating down his calf.  Patient is a 48 y.o. male presenting with shortness of breath. The history is provided by the patient.  Shortness of Breath Associated symptoms: cough     Past Medical History  Diagnosis Date  . Hypertension   . Peripheral vascular disease   . Renal disorder     [place],Dialysis- T,Th,S  . Renal insufficiency    Past Surgical History  Procedure Laterality Date  . Av fistula placement Left     forearm   . Shuntogram Left November 04, 2011  . Patch angioplasty Left 07/23/2013    Procedure: PATCH ANGIOPLASTY- LEFT RADIOCEPHALIC ARTERIOVENOUS FISTULA;  Surgeon: Angelia Mould, MD;  Location: [facility] OR;  Service: Vascular;  Laterality: Left;   Family History  Problem Relation Age of Onset  . Diabetes Mother   . Hypertension Mother   . Hypertension Father   . Hypertension Sister   . Hypertension  Brother    History  Substance Use Topics  . Smoking status: Never Smoker   . Smokeless tobacco: Never Used  . Alcohol Use: Yes    Review of Systems  Constitutional: Positive for fatigue.  Respiratory: Positive for cough and shortness of breath.   Musculoskeletal:       + LLE pain.  Neurological: Positive for weakness.  All other systems reviewed and are negative.     Allergies  Review of patient's allergies indicates no known allergies.  Home Medications   Prior to Admission medications   Medication Sig Start Date End Date Taking? Authorizing Provider  cinacalcet (SENSIPAR) 60 MG tablet Take 60 mg by mouth daily.   Yes Historical Provider, MD  lisinopril (PRINIVIL,ZESTRIL) 20 MG tablet Take 20 mg by mouth daily.   Yes Historical Provider, MD  oxyCODONE-acetaminophen (PERCOCET/ROXICET) 5-325 MG per tablet Take 1-2 tablets by mouth every 4 (four) hours as needed for severe pain. 03/04/14  Yes Virgel Manifold, MD  sevelamer carbonate (RENVELA) 800 MG tablet Take 800 mg by mouth 3 (three) times daily with meals.   Yes Historical Provider, MD   BP 157/90  Pulse 109  Temp(Src) 101 F (38.3 C) (Oral)  Resp 36  SpO2 97% Physical Exam  Nursing note and vitals reviewed. Constitutional: He is oriented to person, place, and time. He appears well-developed and well-nourished. No distress.  HENT:  Head: Normocephalic and atraumatic.  Mouth/Throat: Oropharynx is clear and moist.  Eyes: Conjunctivae and EOM are normal. Pupils are equal, round, and reactive  to light.  Neck: Normal range of motion. Neck supple. No JVD present.  Cardiovascular: Regular rhythm, normal heart sounds and intact distal pulses.   Tachycardia. No extremity edema.  Pulmonary/Chest: Effort normal. No respiratory distress.  Mild rales at lung bases.  Abdominal: Soft. Bowel sounds are normal. There is no tenderness.  Musculoskeletal: Normal range of motion. He exhibits no edema.  TTP left popliteal space and left  calf. No swelling. +2 DP/PT pulses bilateral.  Neurological: He is alert and oriented to person, place, and time. He has normal strength. No sensory deficit.  Speech fluent, goal oriented. Moves limbs without ataxia. Equal grip strength bilateral.  Skin: Skin is warm and dry. He is not diaphoretic.  Psychiatric: He has a normal mood and affect. His behavior is normal.    ED Course  Procedures (including critical care time) Labs Review Labs Reviewed  BASIC METABOLIC PANEL - Abnormal; Notable for the following:    Potassium 3.6 (*)    Chloride 94 (*)    CO2 33 (*)    Glucose, Bld 105 (*)    Creatinine, Ser 5.85 (*)    GFR calc non Af Amer 10 (*)    GFR calc Af Amer 12 (*)    All other components within normal limits  CBC - Abnormal; Notable for the following:    WBC 12.3 (*)    RBC 3.04 (*)    Hemoglobin 9.2 (*)    HCT 28.2 (*)    All other components within normal limits  PRO B NATRIURETIC PEPTIDE - Abnormal; Notable for the following:    Pro B Natriuretic peptide (BNP) 30804.0 (*)    All other components within normal limits  CULTURE, BLOOD (ROUTINE X 2)  CULTURE, BLOOD (ROUTINE X 2)  MAGNESIUM  I-STAT TROPOININ, ED    Imaging Review Dg Chest 2 View  03/22/2014   CLINICAL DATA:  Shortness of breath  EXAM: CHEST  2 VIEW  COMPARISON:  07/23/2013  FINDINGS: There is no focal parenchymal opacity, pleural effusion, or pneumothorax. Mild pulmonary vascular congestion. There is mild cardiomegaly.  The osseous structures are unremarkable.  IMPRESSION: Mild pulmonary vascular congestion. Otherwise no active cardiopulmonary disease.   Electronically Signed   By: Kathreen Devoid   On: 03/22/2014 12:14   Ct Angio Chest W/cm &/or Wo Cm  03/22/2014   CLINICAL DATA:  Short of breath.  Cough.  EXAM: CT ANGIOGRAPHY CHEST WITH CONTRAST  TECHNIQUE: Multidetector CT imaging of the chest was performed using the standard protocol during bolus administration of intravenous contrast. Multiplanar CT  image reconstructions and MIPs were obtained to evaluate the vascular anatomy.  CONTRAST:  70mL OMNIPAQUE IOHEXOL 350 MG/ML SOLN  COMPARISON:  09/12/2012  FINDINGS: No evidence of a pulmonary embolus.  Heart is mildly enlarged. There are minor coronary artery calcifications. Great vessels are normal in caliber.  There is mediastinal adenopathy. Multiple reference measurements were made. There is a 14 mm short axis node in the right superior mediastinum. There is a 19 mm short axis prevascular lymph node adjacent the left pulmonary artery. There is a 15 mm right subcarinal lymph node. There are several other prominent to mildly enlarged lymph nodes. Although there were prominent nodes previously, the larger nodes have increased in size. There are prominent hilar lymph nodes not well defined, and without change.  Small right pleural effusion.  No left effusion.  Stable calcified pleural plaque over the right hemidiaphragm.  Lungs show mild diffuse interstitial thickening and hazy and  somewhat heterogeneous airspace opacities which have a central to upper lobe predominance. Interstitial thickening is greatest in the lung bases.  Limited evaluation of the upper abdomen shows gallstones but is otherwise unremarkable.  No osteoblastic or osteolytic lesions.  Review of the MIP images confirms the above findings.  IMPRESSION: 1. No evidence of a pulmonary embolism. 2. Mild cardiomegaly, interstitial thickening and hazy areas of airspace lung opacity, as well as a small right effusion, all suggests congestive heart failure. However, there also prominent and mildly enlarged mediastinal lymph nodes. These are likely reactive. The possibility of lung infection should be considered in the proper clinical setting.   Electronically Signed   By: Lajean Manes M.D.   On: 03/22/2014 13:09     EKG Interpretation   Date/Time:  Tuesday March 22 2014 11:29:51 EDT Ventricular Rate:  109 PR Interval:  132 QRS Duration: 92 QT  Interval:  388 QTC Calculation: 522 R Axis:   -100 Text Interpretation:  Sinus tachycardia Right superior axis deviation  Pulmonary disease pattern Incomplete right bundle branch block T wave  abnormality, consider inferior ischemia T wave abnormality, consider  anterolateral ischemia Prolonged QT Abnormal ECG T wave changes are new  compared to 2014 Confirmed by GOLDSTON  MD, [place] ([id number]) on 03/22/2014  11:36:03 AM      MDM   Final diagnoses:  CHF exacerbation  Shortness of breath  Sepsis, due to unspecified organism   Patient presenting with shortness of breath. He is uncomfortable appearing but in no apparent distress. Temperature 99.1 orally, refuses rectal temperature. Tachycardic. O2 sat noted to be 80% on room air, increased to 97% on 2.5 liters of oxygen. Recent diagnosis of upper extremity DVT not on anticoagulation. Concern for PE. CT angiogram pending. I spoke with Dr. Marval Regal, ESRD nephrologist on call who states CT angiogram is ok for pt to get at this time and can have dialysis Thursday as scheduled.  CT scan negative for PE. Mild cardiomegaly, interstitial thickening and hazy areas of airspace lung A. as well as small right effusion, all suggesting congestive heart failure. There are also prominent and mildly enlarged mediastinal lymph nodes, likely reactive. Possibility of lung infection cannot be excluded. It is noted throughout the visit, patient's temperature increased to 101. Heart rate remained greater than 100, respiratory rate increased to 36 from 20. Patient is now most likely septic. Blood cultures obtained and antibiotics given. Leukocytosis of 12.3. BNP [id number]. Regarding EKG changes, EKG shown to Dr. Acie Fredrickson with cardiology who was present in the ED. He states it is more likely due to an electrolyte imbalance despite no significant imbalance noted. QT prolongation most likely from cinacalcet. He does not feel this is a cardiology admission at this time. Patient  admitted by internal medicine teaching service.  Case discussed with attending Dr. Regenia Skeeter who also evaluated patient and agrees with plan of care.    Illene Labrador, PA-C 03/22/14 1446

## 2014-03-22 NOTE — ED Notes (Signed)
Pt wheeled to room placed into gown and on monitor. Pt monitored by blood pressure, pulse ox, and 5 lead. Angie Fava, RN at bedside. Pts EKG given to and signed by Dr. Regenia Skeeter

## 2014-03-22 NOTE — Progress Notes (Signed)
Admission note:  Arrival Method: ED stretcher  Mental Orientation: alert & oriented x 4  Telemetry: box #18 applied and CCMD notified  Assessment: completed  Skin: no skin issues  IV: right AC Pain: pt reports slight pain in left leg (MRI) has been ordered  Tubes: N/A Safety Measures: Patient Handbook has been given, and discussed the Fall Prevention worksheet. Left at bedside  Admission: Completed and admission orders have been written  6E Orientation: Patient has been oriented to the unit, staff and to the room.  Family: At the bedside

## 2014-03-22 NOTE — H&P (Signed)
Date: 03/22/2014               Patient Name:  Timothy Palmer MRN: [id number]  DOB: Oct 04, 1965 Age / Sex: 48 y.o., male   PCP: Louis Meckel, MD              Medical Service: Internal Medicine Teaching Service              Attending Physician: Dr. Aldine Contes, MD    First Contact: Dahlia Bailiff, MS 4 Pager: [phone]  Second Contact: Dr. Eulas Post Pager: [phone]  Third Contact  Pager:        After Hours (After 5p/  First Contact Pager: [phone]  weekends / holidays): Second Contact Pager: [phone]   Chief Complaint: Chief Complaint  Patient presents with  . 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 Reports that 3 weeks ago he began to have cough productive of yellow sputum that has progressively increasing since that time. Over this same time period he notes progressive onset of shortness of breath on exertion that resolves with rest. He endorses a baseline of full functional capacity "able to walk all across town." He states that he has also noticed feeling more short of breath when he lays down at night and has starting using two pillows instead of one. Denies history of heart attack or ever having to see a cardiologist in the past. Denies missing any recent dialysis appointment (or ever missing one, for that matter). He denied having any fevers of chills until yesterday when he experienced some chills. Found to be diaphoretic and febrile to 38.3C in the ED. Has some nausea associated with prolonged coughing, but no vomiting. Denies chest pain, abdominal pain, changes in bowel movements.  Reports that yesterday he was mowing the lawn and and heard felt a "popping" sensation in his knee and experienced onset of numbness  from his knee down to his foot. He also noticed that he felt very short of breath, requiring him to sit down for 89min. States sensation came back but has residual "pulling" pain on the posterior aspect of his knee, no swelling.   Allergies: Review of patient's allergies indicates no known allergies.  Medications:  Current Facility-Administered Medications  Medication Dose Route Frequency Provider Last Rate Last Dose  . [START ON 03/24/2014] ceFEPIme (MAXIPIME) 2 g in dextrose 5 % 50 mL IVPB  2 g Intravenous Q T,Th,Sa-HD Ephraim Hamburger, MD      . vancomycin (VANCOCIN) 1,750 mg in sodium chloride 0.9 % 500 mL IVPB  1,750 mg Intravenous Once Ephraim Hamburger, MD 250 mL/hr at 03/22/14 1516 1,750 mg at 03/22/14 1516   Current Outpatient Prescriptions  Medication Sig Dispense Refill  . cinacalcet (SENSIPAR) 60 MG tablet Take 60 mg by mouth daily.      Marland Kitchen lisinopril (PRINIVIL,ZESTRIL) 20 MG tablet Take 20 mg by mouth daily.      Marland Kitchen oxyCODONE-acetaminophen (PERCOCET/ROXICET) 5-325 MG per tablet Take 1-2 tablets by mouth every 4 (four) hours as needed for severe pain.  20 tablet  0  . sevelamer carbonate (RENVELA) 800 MG tablet Take 800 mg by mouth 3 (three) times daily with meals.        Medical History: Past Medical History  Diagnosis  Date  . Hypertension   . Peripheral vascular disease   . Renal disorder     [place],Dialysis- T,Th,S  . Renal insufficiency     Surgical History: Past Surgical History  Procedure Laterality Date  . Av fistula placement Left     forearm   . Shuntogram Left November 04, 2011  . Patch angioplasty Left 07/23/2013    Procedure: PATCH ANGIOPLASTY- LEFT RADIOCEPHALIC ARTERIOVENOUS FISTULA;  Surgeon: Angelia Mould, MD;  Location: [facility] OR;  Service: Vascular;  Laterality: Left;    Social History: History   Social History  . Marital Status: Single    Spouse Name: N/A    Number of Children: N/A  . Years of Education: N/A   Occupational History  . Not  on file.   Social History Main Topics  . Smoking status: Never Smoker   . Smokeless tobacco: Never Used  . Alcohol Use: Yes  . Drug Use: Yes    Special: Marijuana     Comment: last used 03/20/14  . Sexual Activity: Not on file   Other Topics Concern  . Not on file   Social History Narrative  . No narrative on file    Family History: Family History  Problem Relation Age of Onset  . Diabetes Mother   . Hypertension Mother   . Hypertension Father   . Hypertension Sister   . Hypertension Brother     Review of Systems: 10 systems reviewed and are negative unless otherwise mentioned in HPI.  Labs/Studies: Labs and studies reviewed per EMR:  Pertinent labs:  WBC 12.3; Hb 9.2 (last recorded 11.9) 140/3.6/94/33/12/5.85<105 Mag 1.8 AG 13  BCx x4 pending  LLE duplex no evidence DVT, superficial thrombosis, or Baker's cyst. CTA negative for PE CXR Mild pulmonary vascular congestion, mild cardiomegaly. Otherwise no active cardiopulmonary disease. EKG with diffuse T-wave inversions, changed from prior  Physical Exam: General: WDWN male, diaphoretic, alert and cooperative Skin: Skin color, texture, turgor normal. No rashes or lesions noted HEENT: NCAT, PERRL, EOMI, sclera anicteric, no conjunctival injection, no discharge, OP clear Cardiovascular: II/VI SEM at RUSB, II/VI holosystolic murmur midclavicular line at 5th intercostal space Pulse regular rate and rhythm, normal S1S2, no gallops Pulmonary: increased WOB, CTAB, normal depth and effort, normal WOB Abdomen: Soft, NTND, +BS, no masses appreciated Extremities: 1+ R, 3+ with thrill L radial pulses; 1+ R, 2+ L DP pulses, no edema; LUE forearm AVF in place with strong thrill; LLE anterior drawer tests negative Neuro: CN III-XII grossly intact, sensation intact, spontaneous movement of all limbs, no obvious deficits  Assessment/Plan: Principal Problem:   HCAP (healthcare-associated pneumonia) Active Problems:   End stage  renal disease   Hypertension  Acute bronchitis vs HCAP: Pt reports 3 weeks of increasing cough with yellow sputum production and dyspnea on exertion. In ED found to be febrile to 38.3, diaphoretic, tachypnic, reporting chills, with mild leukocytosis (12.3). Lung exam suprisingly unremarkable with no wheezes or rhonchi. However pt with increased WOB and on 2LNC. CXR did not reveal any focal infiltrate, only mild pulmonary congestion. CTA negative for PE, but revealed prominent and mildly enlarged mediastinal lymph nodes; likely reactive. Pt on dialysis and reports that he is around sick coughing people all the time.   - con't cefepime and vanc started in ED. Will treat as though HCAP even though CXR w/o significant findings. Repeat chest x-ray in the AM. - f/u blood cultures x4 - f/u repeat CXR in AM - daily CBC - wean O2  as tolerated  Exertional dyspnea, orthopnea: Pt reports 3 week history of progressive DOE as well as difficulty breathing worse laying down at night, with 2 pillow orthopnea up from 1 pillow a week ago. CTA showing mild cardiomegaly, interstitial thickening and hazy areas of airspace lung opacity, as well as a small right effusion, all suggestive of congestive heart failure. CXR reveals mild pulm congestion and mild cardiomegaly. Last ECHO in 2005 revealed normal LV fxn EF 55-65. Mild hypokinesis of the anteroseptal wall. Left ventricular wall thickness was moderately to markedly increased. Left atrial size was at the upper limits of normal. Heart exam with II/VI systolic murmur but could potentially be flow murmur due to highoutput state with AVF. Likely acute bronchitis/HCAP certainly contributing to dyspnea, but exertional dyspnea, orthopnea, and imaging findings suggestive of potential new onset HF.  - f/u ECHO in AM  LLE Knee Pain: Pt reported "popping" sensation while mowing lawn with numbness from knee down, which eventually resolved. Persistent "pulling" sensation and tenderness  over popliteal area. Pain mildly worse with knee extension and flexion but full strength overall. Tenderness appreciated to palpation. LLE anterior drawer test negative and no appreciable effusion of joint, symmetric to RLE. - follow up LLE MRI - WBAT  Low Bicarb: No ABG from ED, but potentially indicating respiratory alkalosis from tachypnea 2/2 hypoxia from potential HCAP. Will continue to monitor for now.  - f/u AM BMP  This is a Careers information officer Note.  The care of the patient was discussed with Dr. Eulas Post and the assessment and plan was formulated with their assistance.  Please see their note for official documentation of the patient encounter.

## 2014-03-23 ENCOUNTER — Inpatient Hospital Stay (HOSPITAL_COMMUNITY): Payer: Medicare Other

## 2014-03-23 ENCOUNTER — Encounter (HOSPITAL_COMMUNITY): Payer: Self-pay | Admitting: General Practice

## 2014-03-23 DIAGNOSIS — J9600 Acute respiratory failure, unspecified whether with hypoxia or hypercapnia: Secondary | ICD-10-CM

## 2014-03-23 DIAGNOSIS — I5031 Acute diastolic (congestive) heart failure: Secondary | ICD-10-CM

## 2014-03-23 DIAGNOSIS — I330 Acute and subacute infective endocarditis: Secondary | ICD-10-CM | POA: Diagnosis present

## 2014-03-23 DIAGNOSIS — R0609 Other forms of dyspnea: Secondary | ICD-10-CM

## 2014-03-23 DIAGNOSIS — B958 Unspecified staphylococcus as the cause of diseases classified elsewhere: Secondary | ICD-10-CM

## 2014-03-23 DIAGNOSIS — R0989 Other specified symptoms and signs involving the circulatory and respiratory systems: Secondary | ICD-10-CM

## 2014-03-23 DIAGNOSIS — R7881 Bacteremia: Secondary | ICD-10-CM

## 2014-03-23 DIAGNOSIS — I059 Rheumatic mitral valve disease, unspecified: Secondary | ICD-10-CM

## 2014-03-23 DIAGNOSIS — I340 Nonrheumatic mitral (valve) insufficiency: Secondary | ICD-10-CM | POA: Diagnosis present

## 2014-03-23 DIAGNOSIS — I509 Heart failure, unspecified: Secondary | ICD-10-CM

## 2014-03-23 HISTORY — DX: Nonrheumatic mitral (valve) insufficiency: I34.0

## 2014-03-23 LAB — CBC
HEMATOCRIT: 25.6 % — AB (ref 39.0–52.0)
HEMOGLOBIN: 8.4 g/dL — AB (ref 13.0–17.0)
MCH: 31.5 pg (ref 26.0–34.0)
MCHC: 32.8 g/dL (ref 30.0–36.0)
MCV: 95.9 fL (ref 78.0–100.0)
Platelets: 187 10*3/uL (ref 150–400)
RBC: 2.67 MIL/uL — ABNORMAL LOW (ref 4.22–5.81)
RDW: 15.3 % (ref 11.5–15.5)
WBC: 11.9 10*3/uL — ABNORMAL HIGH (ref 4.0–10.5)

## 2014-03-23 LAB — BASIC METABOLIC PANEL
Anion gap: 14 (ref 5–15)
BUN: 21 mg/dL (ref 6–23)
CALCIUM: 10.6 mg/dL — AB (ref 8.4–10.5)
CO2: 31 mEq/L (ref 19–32)
CREATININE: 8.15 mg/dL — AB (ref 0.50–1.35)
Chloride: 93 mEq/L — ABNORMAL LOW (ref 96–112)
GFR, EST AFRICAN AMERICAN: 8 mL/min — AB (ref >90)
GFR, EST NON AFRICAN AMERICAN: 7 mL/min — AB (ref >90)
Glucose, Bld: 106 mg/dL — ABNORMAL HIGH (ref 70–99)
Potassium: 4.2 mEq/L (ref 3.7–5.3)
Sodium: 138 mEq/L (ref 137–147)

## 2014-03-23 LAB — HIV ANTIBODY (ROUTINE TESTING W REFLEX): HIV 1&2 Ab, 4th Generation: NONREACTIVE

## 2014-03-23 LAB — TROPONIN I

## 2014-03-23 LAB — INFLUENZA PANEL BY PCR (TYPE A & B)
H1N1 flu by pcr: NOT DETECTED
INFLAPCR: NEGATIVE
Influenza B By PCR: NEGATIVE

## 2014-03-23 LAB — STREP PNEUMONIAE URINARY ANTIGEN: Strep Pneumo Urinary Antigen: NEGATIVE

## 2014-03-23 LAB — PROCALCITONIN: Procalcitonin: 4.19 ng/mL

## 2014-03-23 MED ORDER — AMLODIPINE BESYLATE 5 MG PO TABS
5.0000 mg | ORAL_TABLET | Freq: Every day | ORAL | Status: DC
  Filled 2014-03-23: qty 1

## 2014-03-23 MED ORDER — SODIUM CHLORIDE 0.9 % IV SOLN
INTRAVENOUS | Status: DC
  Administered 2014-03-24: 06:00:00 via INTRAVENOUS

## 2014-03-23 MED ORDER — DARBEPOETIN ALFA-POLYSORBATE 40 MCG/0.4ML IJ SOLN
40.0000 ug | INTRAMUSCULAR | Status: DC
  Administered 2014-03-31: 40 ug via INTRAVENOUS
  Filled 2014-03-23 (×3): qty 0.4

## 2014-03-23 MED ORDER — HYDRALAZINE HCL 20 MG/ML IJ SOLN
5.0000 mg | Freq: Once | INTRAMUSCULAR | Status: AC
  Administered 2014-03-23: 5 mg via INTRAVENOUS
  Filled 2014-03-23: qty 1

## 2014-03-23 MED ORDER — DEXTROSE 5 % IV SOLN
2.0000 g | INTRAVENOUS | Status: DC
  Filled 2014-03-23: qty 2

## 2014-03-23 MED ORDER — CINACALCET HCL 30 MG PO TABS
60.0000 mg | ORAL_TABLET | Freq: Every day | ORAL | Status: DC
  Administered 2014-03-23 – 2014-04-04 (×12): 60 mg via ORAL
  Filled 2014-03-23 (×14): qty 2

## 2014-03-23 MED ORDER — TRAMADOL HCL 50 MG PO TABS
50.0000 mg | ORAL_TABLET | Freq: Once | ORAL | Status: AC
  Administered 2014-03-23: 50 mg via ORAL
  Filled 2014-03-23: qty 1

## 2014-03-23 MED ORDER — AMLODIPINE BESYLATE 10 MG PO TABS
10.0000 mg | ORAL_TABLET | Freq: Every day | ORAL | Status: DC
  Administered 2014-03-23 – 2014-03-28 (×6): 10 mg via ORAL
  Filled 2014-03-23 (×6): qty 1

## 2014-03-23 NOTE — Progress Notes (Signed)
   LOS: 1 day   Subjective: - Continued to episodes of sweating overnight - Continuing to cough with some "flem" production. No blood, no N/V  Objective: BP 175/103  Pulse 108  Temp(Src) 99.7 F (37.6 C) (Oral)  Resp 24  Ht 5\' 11"  (1.803 m)  Wt 101.424 kg (223 lb 9.6 oz)  BMI 31.20 kg/m2  SpO2 92%  Intake/Output Summary (Last 24 hours) at 03/23/14 1822 Last data filed at 03/23/14 1717  Gross per 24 hour  Intake    840 ml  Output      0 ml  Net    840 ml    Physical Exam: GEN: diffusely diaphoretic, sweating down to the sheets, awake and oriented, in mild distress EYES: EOMI CV: II/VI holosystolic murmur greatest at mid-clavicular line, tachycardic, S1/S2 no gallop PULM: Some fine crackles at R base but otherwise clear crackles ABD: soft, NT/ND, +BS EXT: No edema  Labs/Studies: I have reviewed labs and studies from last 24hrs per EMR.  Medications: I have reviewed the patient's current medications.  Assessment/Plan: Principal Problem:   Mitral valve vegetation Active Problems:   End stage renal disease   Hypertension   Acute bronchitis   T wave inversion in EKG   QT prolongation   Hypokalemia  42M h/o ESRD on dialysis found to be bacteremia with staph aureus on 8/30 cx, not presents with increasing dyspnea, fever, diaphoresis with MV vegetation  Infective Endocarditis: Pt had ECHO for new onset murmur and increased pulm vasc congestion and mild cardiomegaly, found to have mod-severe MR. Per nephrology, had 8/28 blood cx + staph aureus and was started on cefazolin 2g qHD scheduled to end 04/26/2014. Source likely dialysis access. - cardiology and ID c/s; appreciate recs - per cards- will plan for TEE; aim for afterload reduction, now on home lisinopril, added amlodipine 10 - per ID- agree with vanc/cefepime (9/15-) qHD for now, will attempt to get cx data from nephrology; will repeat blood cx - possible CVTS c/s for incompetent valve - f/u blood cx  Left knee  pain: Given likely staph bacteremia with persistent L knee pain, cancelled MRI and c/s ortho for r/o septic joint - f/u ortho c/s; appreciate recs  Acute hypoxic respiratory failure: Likely secondary to acute MR with increased pulmonary congestion.  - afterload reduction as above to reduce relative degree of MR - no diuresis as pt is pulm edema is due to MR, not decreased HF  ERSD: On TTS dialysis. Has not missed any appointments recently.  - Nephrology c/s; appreciate recs - Will plan for dialysis tomorrow with abx dosed after completion  Dispo: Floor status  This is a Careers information officer Note.  The care of the patient was discussed with Dr. Eulas Post and the assessment and plan formulated with their assistance.  Please see their attached note for official documentation of the daily encounter.

## 2014-03-23 NOTE — H&P (Signed)
  Date: 03/23/2014               Patient Name:  Timothy Palmer MRN: [id number]  DOB: [birth year] Age / Sex: 48 y.o., male   PCP: Louis Meckel, MD              Medical Service: Internal Medicine Teaching Service     I have reviewed the note by Jack Quarto, MS IV and was present during the interview and physical exam.  Please see my separate note from 03/22/14 for findings, assessment, and plan.   Signed: Otho Bellows, MD 03/23/2014, 8:56 AM

## 2014-03-23 NOTE — Progress Notes (Addendum)
Subjective: Pt states that he is still having "hot flashes" with sweating that occur intermittently. TTE done today with mitral vegetation. Still with SOB.  Objective: Vital signs in last 24 hours: Filed Vitals:   03/22/14 1815 03/22/14 2100 03/23/14 0700 03/23/14 1047  BP: 147/94 153/86 166/89 164/107  Pulse: 100 105 103 102  Temp: 98.6 F (37 C) 99 F (37.2 C) 100.2 F (37.9 C) 99.3 F (37.4 C)  TempSrc: Oral Oral Oral Oral  Resp: 20 22 26 23   Height:  5\' 11"  (1.803 m)    Weight:  223 lb 9.6 oz (101.424 kg)    SpO2: 92% 98% 96% 92%   Weight change:   Intake/Output Summary (Last 24 hours) at 03/23/14 1231 Last data filed at 03/23/14 0900  Gross per 24 hour  Intake    480 ml  Output      0 ml  Net    480 ml   Vitals reviewed. General: Resting in bed, mild distress, diaphoretic  HEENT: EOMI Cardiac: RRR, 3/6 holosystolic murmur at the apex Pulm: clear to auscultation bilaterally, no wheezes, rales, or rhonchi Abd: soft, nontender, nondistended, BS present Ext: warm and well perfused, no pedal edema Neuro: alert and oriented X3, moves all 4 extremities, nonfocal   Lab Results: Basic Metabolic Panel:  Recent Labs Lab 03/22/14 1142 03/22/14 1349 03/22/14 1922 03/23/14 0014  NA 140  --   --  138  K 3.6*  --   --  4.2  CL 94*  --   --  93*  CO2 33*  --   --  31  GLUCOSE 105*  --   --  106*  BUN 12  --   --  21  CREATININE 5.85*  --   --  8.15*  CALCIUM 9.8  --   --  10.6*  MG  --  1.8  --   --   PHOS  --   --  5.9*  --    CBC:  Recent Labs Lab 03/22/14 1142 03/23/14 0014  WBC 12.3* 11.9*  HGB 9.2* 8.4*  HCT 28.2* 25.6*  MCV 92.8 95.9  PLT 215 187   Cardiac Enzymes:  Recent Labs Lab 03/22/14 1922 03/23/14 0014 03/23/14 0612  TROPONINI <0.30 <0.30 <0.30   BNP:  Recent Labs Lab 03/22/14 1142  PROBNP 30804.0*   Thyroid Function Tests:  Recent Labs Lab 03/22/14 1922  TSH 0.901    Urinalysis: No results found for this basename:  COLORURINE, APPERANCEUR, LABSPEC, PHURINE, GLUCOSEU, HGBUR, BILIRUBINUR, KETONESUR, PROTEINUR, UROBILINOGEN, NITRITE, LEUKOCYTESUR,  in the last 168 hours  Misc. Labs:   Micro Results: Recent Results (from the past 240 hour(s))  CULTURE, BLOOD (ROUTINE X 2)     Status: None   Collection Time    03/22/14  1:52 PM      Result Value Ref Range Status   Specimen Description BLOOD ARM RIGHT   Final   Special Requests BOTTLES DRAWN AEROBIC ONLY 10CC   Final   Culture  Setup Time     Final   Value: 03/22/2014 18:55     Performed at [facility]   Culture     Final   Value:        BLOOD CULTURE RECEIVED NO GROWTH TO DATE CULTURE WILL BE HELD FOR 5 DAYS BEFORE ISSUING A FINAL NEGATIVE REPORT     Performed at [facility]   Report Status PENDING   Incomplete  CULTURE, BLOOD (ROUTINE X 2)  Status: None   Collection Time    03/22/14  2:00 PM      Result Value Ref Range Status   Specimen Description BLOOD HAND RIGHT   Final   Special Requests BOTTLES DRAWN AEROBIC AND ANAEROBIC 10CC   Final   Culture  Setup Time     Final   Value: 03/22/2014 18:56     Performed at [facility]   Culture     Final   Value:        BLOOD CULTURE RECEIVED NO GROWTH TO DATE CULTURE WILL BE HELD FOR 5 DAYS BEFORE ISSUING A FINAL NEGATIVE REPORT     Performed at [facility]   Report Status PENDING   Incomplete   Studies/Results: Dg Chest 2 View  03/23/2014   CLINICAL DATA:  Weakness, leg pain  EXAM: CHEST  2 VIEW  COMPARISON:  03/22/2014  FINDINGS: Borderline cardiomegaly. Small right pleural effusion with right basilar atelectasis. Central mild vascular congestion and mild perihilar interstitial prominence suspicious for mild interstitial edema. Bony thorax is unremarkable.  IMPRESSION: Small right pleural effusion with right basilar atelectasis. Central mild vascular congestion and mild perihilar interstitial prominence suspicious for mild interstitial edema. No segmental  infiltrate.   Electronically Signed   By: Lahoma Crocker M.D.   On: 03/23/2014 09:04   Dg Chest 2 View  03/22/2014   CLINICAL DATA:  Shortness of breath  EXAM: CHEST  2 VIEW  COMPARISON:  07/23/2013  FINDINGS: There is no focal parenchymal opacity, pleural effusion, or pneumothorax. Mild pulmonary vascular congestion. There is mild cardiomegaly.  The osseous structures are unremarkable.  IMPRESSION: Mild pulmonary vascular congestion. Otherwise no active cardiopulmonary disease.   Electronically Signed   By: Kathreen Devoid   On: 03/22/2014 12:14   Ct Angio Chest W/cm &/or Wo Cm  03/22/2014   CLINICAL DATA:  Short of breath.  Cough.  EXAM: CT ANGIOGRAPHY CHEST WITH CONTRAST  TECHNIQUE: Multidetector CT imaging of the chest was performed using the standard protocol during bolus administration of intravenous contrast. Multiplanar CT image reconstructions and MIPs were obtained to evaluate the vascular anatomy.  CONTRAST:  37mL OMNIPAQUE IOHEXOL 350 MG/ML SOLN  COMPARISON:  09/12/2012  FINDINGS: No evidence of a pulmonary embolus.  Heart is mildly enlarged. There are minor coronary artery calcifications. Great vessels are normal in caliber.  There is mediastinal adenopathy. Multiple reference measurements were made. There is a 14 mm short axis node in the right superior mediastinum. There is a 19 mm short axis prevascular lymph node adjacent the left pulmonary artery. There is a 15 mm right subcarinal lymph node. There are several other prominent to mildly enlarged lymph nodes. Although there were prominent nodes previously, the larger nodes have increased in size. There are prominent hilar lymph nodes not well defined, and without change.  Small right pleural effusion.  No left effusion.  Stable calcified pleural plaque over the right hemidiaphragm.  Lungs show mild diffuse interstitial thickening and hazy and somewhat heterogeneous airspace opacities which have a central to upper lobe predominance. Interstitial  thickening is greatest in the lung bases.  Limited evaluation of the upper abdomen shows gallstones but is otherwise unremarkable.  No osteoblastic or osteolytic lesions.  Review of the MIP images confirms the above findings.  IMPRESSION: 1. No evidence of a pulmonary embolism. 2. Mild cardiomegaly, interstitial thickening and hazy areas of airspace lung opacity, as well as a small right effusion, all suggests congestive heart failure. However, there also  prominent and mildly enlarged mediastinal lymph nodes. These are likely reactive. The possibility of lung infection should be considered in the proper clinical setting.   Electronically Signed   By: Lajean Manes M.D.   On: 03/22/2014 13:09   Medications: I have reviewed the patient's current medications. Scheduled Meds: . amLODipine  5 mg Oral Q2200  . [START ON 03/24/2014] ceFEPime (MAXIPIME) IV  2 g Intravenous Q T,Th,Sat-1800  . cinacalcet  60 mg Oral QPC supper  . [START ON 03/24/2014] darbepoetin (ARANESP) injection - DIALYSIS  40 mcg Intravenous Q Thu-HD  . heparin  5,000 Units Subcutaneous 3 times per day  . lisinopril  20 mg Oral Daily  . sevelamer carbonate  800 mg Oral TID WC  . sodium chloride  3 mL Intravenous Q12H  . sodium chloride  3 mL Intravenous Q12H  . [START ON 03/24/2014] vancomycin  1,000 mg Intravenous Q T,Th,Sa-HD   Continuous Infusions:  PRN Meds:.sodium chloride, sodium chloride  Assessment/Plan: 48yo M w/ PMH HTN and ESRD on HD presents to the ED with increasing SOB in the setting of a productive cough and fever and was found to have endocarditis.    Sepsis with mitral valve endocarditis: Pt presented with productive cough with yellow sputum x 3 week with increased SOB and chills at home. Temp 101 in the ED. Mild leukocytosis to 12.3. CXR with mild vascular congestion. Initially thought that he could possibly have pneumonia that did not show up on CXR. CTA was negative for PE but did show mediastinal lymphadenopathy and  pulmonary edema. Vanc and Cefepime started in the ED to treat empirically, and TTE was ordered to assess cardiac function in the setting of his pulmonary congestion, which showed a mobile vegetation on the mitral valve and possible perforation. Nephrology notes that he had S. Aureus positive blood cultures 8/20 treated with cefazolin 2g with HD to continue until 10/20.  - ID consult, appreciate recommendations - Cardiology consult, appreciate recommendations  - TEE - Continue Vanc/Cefepime   - Blood cultures, NGTD - Sputum culture and gram stain  - Strep Ag negative; legionella Ag pending - Respiratory viral panel  - HIV nonreactive  Pain in popliteal fossa: Pt reports a popping sensation behind his left knee that occurred while mowing the grass yesterday. No swelling but pain with ambulation. No edema on edam, but tender to palpation at tendon insertion points in popliteal fossa. MRI left knee was scheduled, but given recent echo findings, concerned for possible septic joint. Will consult Ortho.  - Holding on MRI - Ortho consult for possible septic joint.   End stage renal disease on HD with possible volume overload: HD TThSa. Last dialysis today. Does not appear volume overload on exam, but CXR and CTA suggestive of congestive heart failure. Nephrology consulted; HD tomorrow.  - F/u with Nephrology  - HD Thursday   Hypertension: Pt on ACEi at home. BP somewhat elevated today. Continuing Lisinopril 20mg  daily.  T wave inversion in EKG: Improved. New diffuse T-wave inversions on EKG on admission. Troponin negative x3. Cardiology curbsided in the ED and felt changes duer to electolyte abnormalities. K 3.6 and Mg 1.8. Replaced K.   QT prolongation: Resolved. QTc >600 when previously normal. Improved this morning. Holding QTc prolonging drugs.     Dispo: Disposition is deferred at this time, awaiting improvement of current medical problems.  Anticipated discharge in approximately 2-4 day(s).     The patient does have a current PCP Louis Meckel, MD) and  does not need an [facility] hospital follow-up appointment after discharge.  The patient does not have transportation limitations that hinder transportation to clinic appointments.  .Services Needed at time of discharge: Y = Yes, Blank = No PT:   OT:   RN:   Equipment:   Other:     LOS: 1 day   Otho Bellows, MD 03/23/2014, 12:31 PM

## 2014-03-23 NOTE — Consult Note (Signed)
Reason for Consult:painful left knee Referring Physician: internal medicine physicians  Timothy Palmer is an 48 y.o. male.  HPI: the patient is a 48 year old male on renal dialysis who had a twisting injury to his left knee several days ago.  He felt a pop and has had difficult time bearing weight since that time.  He has been hospitalized with a pneumonia and has recently been found to have  A vegetation on the heart valve.  There is concern for bacteremia and possible septic knee.  We are consulted for evaluation of the knee at this point.  Past Medical History  Diagnosis Date  . Hypertension   . Peripheral vascular disease   . Pneumonia 2014  . ESRD (end stage renal disease) on dialysis     [place],Dialysis- T,Th,S    Past Surgical History  Procedure Laterality Date  . Av fistula placement Left ?2005    forearm   . Shuntogram Left November 04, 2011  . Patch angioplasty Left 07/23/2013    Procedure: PATCH ANGIOPLASTY- LEFT RADIOCEPHALIC ARTERIOVENOUS FISTULA;  Surgeon: Angelia Mould, MD;  Location: [facility] OR;  Service: Vascular;  Laterality: Left;    Family History  Problem Relation Age of Onset  . Diabetes Mother   . Hypertension Mother   . Hypertension Father   . Hypertension Sister   . Hypertension Brother     Social History:  reports that he has never smoked. He has never used smokeless tobacco. He reports that he drinks alcohol. He reports that he uses illicit drugs (Marijuana).  Allergies: No Known Allergies  Medications: I have reviewed the patient's current medications.  Results for orders placed during the hospital encounter of 03/22/14 (from the past 48 hour(s))  BASIC METABOLIC PANEL     Status: Abnormal   Collection Time    03/22/14 11:42 AM      Result Value Ref Range   Sodium 140  137 - 147 mEq/L   Potassium 3.6 (*) 3.7 - 5.3 mEq/L   Chloride 94 (*) 96 - 112 mEq/L   CO2 33 (*) 19 - 32 mEq/L   Glucose, Bld 105 (*) 70 - 99 mg/dL   BUN 12  6 - 23 mg/dL    Creatinine, Ser 5.85 (*) 0.50 - 1.35 mg/dL   Calcium 9.8  8.4 - 10.5 mg/dL   GFR calc non Af Amer 10 (*) >90 mL/min   GFR calc Af Amer 12 (*) >90 mL/min   Comment: (NOTE)     The eGFR has been calculated using the CKD EPI equation.     This calculation has not been validated in all clinical situations.     eGFR's persistently <90 mL/min signify possible Chronic Kidney     Disease.   Anion gap 13  5 - 15  CBC     Status: Abnormal   Collection Time    03/22/14 11:42 AM      Result Value Ref Range   WBC 12.3 (*) 4.0 - 10.5 K/uL   RBC 3.04 (*) 4.22 - 5.81 MIL/uL   Hemoglobin 9.2 (*) 13.0 - 17.0 g/dL   HCT 28.2 (*) 39.0 - 52.0 %   MCV 92.8  78.0 - 100.0 fL   MCH 30.3  26.0 - 34.0 pg   MCHC 32.6  30.0 - 36.0 g/dL   RDW 15.1  11.5 - 15.5 %   Platelets 215  150 - 400 K/uL  PRO B NATRIURETIC PEPTIDE     Status: Abnormal  Collection Time    03/22/14 11:42 AM      Result Value Ref Range   Pro B Natriuretic peptide (BNP) 30804.0 (*) 0 - 125 pg/mL  I-STAT TROPOININ, ED     Status: None   Collection Time    03/22/14 11:49 AM      Result Value Ref Range   Troponin i, poc 0.08  0.00 - 0.08 ng/mL   Comment 3            Comment: Due to the release kinetics of cTnI,     a negative result within the first hours     of the onset of symptoms does not rule out     myocardial infarction with certainty.     If myocardial infarction is still suspected,     repeat the test at appropriate intervals.  MAGNESIUM     Status: None   Collection Time    03/22/14  1:49 PM      Result Value Ref Range   Magnesium 1.8  1.5 - 2.5 mg/dL  CULTURE, BLOOD (ROUTINE X 2)     Status: None   Collection Time    03/22/14  1:52 PM      Result Value Ref Range   Specimen Description BLOOD ARM RIGHT     Special Requests BOTTLES DRAWN AEROBIC ONLY 10CC     Culture  Setup Time       Value: 03/22/2014 18:55     Performed at [facility]   Culture       Value:        BLOOD CULTURE RECEIVED NO GROWTH TO  DATE CULTURE WILL BE HELD FOR 5 DAYS BEFORE ISSUING A FINAL NEGATIVE REPORT     Performed at [facility]   Report Status PENDING    CULTURE, BLOOD (ROUTINE X 2)     Status: None   Collection Time    03/22/14  2:00 PM      Result Value Ref Range   Specimen Description BLOOD HAND RIGHT     Special Requests BOTTLES DRAWN AEROBIC AND ANAEROBIC 10CC     Culture  Setup Time       Value: 03/22/2014 18:56     Performed at [facility]   Culture       Value:        BLOOD CULTURE RECEIVED NO GROWTH TO DATE CULTURE WILL BE HELD FOR 5 DAYS BEFORE ISSUING A FINAL NEGATIVE REPORT     Performed at [facility]   Report Status PENDING    TROPONIN I     Status: None   Collection Time    03/22/14  7:22 PM      Result Value Ref Range   Troponin I <0.30  <0.30 ng/mL   Comment:            Due to the release kinetics of cTnI,     a negative result within the first hours     of the onset of symptoms does not rule out     myocardial infarction with certainty.     If myocardial infarction is still suspected,     repeat the test at appropriate intervals.  TSH     Status: None   Collection Time    03/22/14  7:22 PM      Result Value Ref Range   TSH 0.901  0.350 - 4.500 uIU/mL  PHOSPHORUS     Status: Abnormal   Collection  Time    03/22/14  7:22 PM      Result Value Ref Range   Phosphorus 5.9 (*) 2.3 - 4.6 mg/dL  INFLUENZA PANEL BY PCR (TYPE A & B, H1N1)     Status: None   Collection Time    03/22/14  9:22 PM      Result Value Ref Range   Influenza A By PCR NEGATIVE  NEGATIVE   Influenza B By PCR NEGATIVE  NEGATIVE   H1N1 flu by pcr NOT DETECTED  NOT DETECTED   Comment:            The Xpert Flu assay (FDA approved for     nasal aspirates or washes and     nasopharyngeal swab specimens), is     intended as an aid in the diagnosis of     influenza and should not be used as     a sole basis for treatment.  BASIC METABOLIC PANEL     Status: Abnormal   Collection Time     03/23/14 12:14 AM      Result Value Ref Range   Sodium 138  137 - 147 mEq/L   Potassium 4.2  3.7 - 5.3 mEq/L   Chloride 93 (*) 96 - 112 mEq/L   CO2 31  19 - 32 mEq/L   Glucose, Bld 106 (*) 70 - 99 mg/dL   BUN 21  6 - 23 mg/dL   Creatinine, Ser 8.15 (*) 0.50 - 1.35 mg/dL   Calcium 10.6 (*) 8.4 - 10.5 mg/dL   GFR calc non Af Amer 7 (*) >90 mL/min   GFR calc Af Amer 8 (*) >90 mL/min   Comment: (NOTE)     The eGFR has been calculated using the CKD EPI equation.     This calculation has not been validated in all clinical situations.     eGFR's persistently <90 mL/min signify possible Chronic Kidney     Disease.   Anion gap 14  5 - 15  CBC     Status: Abnormal   Collection Time    03/23/14 12:14 AM      Result Value Ref Range   WBC 11.9 (*) 4.0 - 10.5 K/uL   RBC 2.67 (*) 4.22 - 5.81 MIL/uL   Hemoglobin 8.4 (*) 13.0 - 17.0 g/dL   HCT 25.6 (*) 39.0 - 52.0 %   MCV 95.9  78.0 - 100.0 fL   MCH 31.5  26.0 - 34.0 pg   MCHC 32.8  30.0 - 36.0 g/dL   RDW 15.3  11.5 - 15.5 %   Platelets 187  150 - 400 K/uL  TROPONIN I     Status: None   Collection Time    03/23/14 12:14 AM      Result Value Ref Range   Troponin I <0.30  <0.30 ng/mL   Comment:            Due to the release kinetics of cTnI,     a negative result within the first hours     of the onset of symptoms does not rule out     myocardial infarction with certainty.     If myocardial infarction is still suspected,     repeat the test at appropriate intervals.  HIV ANTIBODY (ROUTINE TESTING)     Status: None   Collection Time    03/23/14 12:14 AM      Result Value Ref Range   HIV 1&2 Ab, 4th Generation NONREACTIVE  NONREACTIVE   Comment: (NOTE)     A NONREACTIVE HIV Ag/Ab result does not exclude HIV infection since     the time frame for seroconversion is variable. If acute HIV infection     is suspected, a HIV-1 RNA Qualitative TMA test is recommended.     HIV-1/2 Antibody Diff         Not indicated.     HIV-1 RNA, Qual TMA            Not indicated.     PLEASE NOTE: This information has been disclosed to you from records     whose confidentiality may be protected by state law. If your state     requires such protection, then the state law prohibits you from making     any further disclosure of the information without the specific written     consent of the person to whom it pertains, or as otherwise permitted     by law. A general authorization for the release of medical or other     information is NOT sufficient for this purpose.     The performance of this assay has not been clinically validated in     patients less than 106 years old.     Performed at [facility]  TROPONIN I     Status: None   Collection Time    03/23/14  6:12 AM      Result Value Ref Range   Troponin I <0.30  <0.30 ng/mL   Comment:            Due to the release kinetics of cTnI,     a negative result within the first hours     of the onset of symptoms does not rule out     myocardial infarction with certainty.     If myocardial infarction is still suspected,     repeat the test at appropriate intervals.  PROCALCITONIN     Status: None   Collection Time    03/23/14 10:17 AM      Result Value Ref Range   Procalcitonin 4.19     Comment:            Interpretation:     PCT > 2 ng/mL:     Systemic infection (sepsis) is likely,     unless other causes are known.     (NOTE)             ICU PCT Algorithm               Non ICU PCT Algorithm        ----------------------------     ------------------------------             PCT < 0.25 ng/mL                 PCT < 0.1 ng/mL         Stopping of antibiotics            Stopping of antibiotics           strongly encouraged.               strongly encouraged.        ----------------------------     ------------------------------           PCT level decrease by               PCT < 0.25 ng/mL           >=  80% from peak PCT           OR PCT 0.25 - 0.5 ng/mL          Stopping of antibiotics                                                  encouraged.         Stopping of antibiotics               encouraged.        ----------------------------     ------------------------------           PCT level decrease by              PCT >= 0.25 ng/mL           < 80% from peak PCT            AND PCT >= 0.5 ng/mL            Continuing antibiotics                                                  encouraged.           Continuing antibiotics                encouraged.        ----------------------------     ------------------------------         PCT level increase compared          PCT > 0.5 ng/mL             with peak PCT AND              PCT >= 0.5 ng/mL             Escalation of antibiotics                                              strongly encouraged.          Escalation of antibiotics            strongly encouraged.  STREP PNEUMONIAE URINARY ANTIGEN     Status: None   Collection Time    03/23/14 12:31 PM      Result Value Ref Range   Strep Pneumo Urinary Antigen NEGATIVE  NEGATIVE   Comment:            Infection due to S. pneumoniae     cannot be absolutely ruled out     since the antigen present     may be below the detection limit     of the test.    Dg Chest 2 View  03/23/2014   CLINICAL DATA:  Weakness, leg pain  EXAM: CHEST  2 VIEW  COMPARISON:  03/22/2014  FINDINGS: Borderline cardiomegaly. Small right pleural effusion with right basilar atelectasis. Central mild vascular congestion and mild perihilar interstitial prominence suspicious for mild interstitial edema. Bony thorax is unremarkable.  IMPRESSION: Small right pleural effusion with right basilar atelectasis. Central mild vascular congestion and mild perihilar interstitial prominence  suspicious for mild interstitial edema. No segmental infiltrate.   Electronically Signed   By: Lahoma Crocker M.D.   On: 03/23/2014 09:04   Dg Chest 2 View  03/22/2014   CLINICAL DATA:  Shortness of breath  EXAM: CHEST  2 VIEW  COMPARISON:   07/23/2013  FINDINGS: There is no focal parenchymal opacity, pleural effusion, or pneumothorax. Mild pulmonary vascular congestion. There is mild cardiomegaly.  The osseous structures are unremarkable.  IMPRESSION: Mild pulmonary vascular congestion. Otherwise no active cardiopulmonary disease.   Electronically Signed   By: Kathreen Devoid   On: 03/22/2014 12:14   Ct Angio Chest W/cm &/or Wo Cm  03/22/2014   CLINICAL DATA:  Short of breath.  Cough.  EXAM: CT ANGIOGRAPHY CHEST WITH CONTRAST  TECHNIQUE: Multidetector CT imaging of the chest was performed using the standard protocol during bolus administration of intravenous contrast. Multiplanar CT image reconstructions and MIPs were obtained to evaluate the vascular anatomy.  CONTRAST:  71m OMNIPAQUE IOHEXOL 350 MG/ML SOLN  COMPARISON:  09/12/2012  FINDINGS: No evidence of a pulmonary embolus.  Heart is mildly enlarged. There are minor coronary artery calcifications. Great vessels are normal in caliber.  There is mediastinal adenopathy. Multiple reference measurements were made. There is a 14 mm short axis node in the right superior mediastinum. There is a 19 mm short axis prevascular lymph node adjacent the left pulmonary artery. There is a 15 mm right subcarinal lymph node. There are several other prominent to mildly enlarged lymph nodes. Although there were prominent nodes previously, the larger nodes have increased in size. There are prominent hilar lymph nodes not well defined, and without change.  Small right pleural effusion.  No left effusion.  Stable calcified pleural plaque over the right hemidiaphragm.  Lungs show mild diffuse interstitial thickening and hazy and somewhat heterogeneous airspace opacities which have a central to upper lobe predominance. Interstitial thickening is greatest in the lung bases.  Limited evaluation of the upper abdomen shows gallstones but is otherwise unremarkable.  No osteoblastic or osteolytic lesions.  Review of the MIP  images confirms the above findings.  IMPRESSION: 1. No evidence of a pulmonary embolism. 2. Mild cardiomegaly, interstitial thickening and hazy areas of airspace lung opacity, as well as a small right effusion, all suggests congestive heart failure. However, there also prominent and mildly enlarged mediastinal lymph nodes. These are likely reactive. The possibility of lung infection should be considered in the proper clinical setting.   Electronically Signed   By: DLajean ManesM.D.   On: 03/22/2014 13:09    ROS ROS: I have reviewed the patient's review of systems thoroughly and there are no positive responses as relates to the HPI.  EXAM: Blood pressure 175/103, pulse 108, temperature 99.7 F (37.6 C), temperature source Oral, resp. rate 24, height '5\' 11"'  (1.803 m), weight 223 lb 9.6 oz (101.424 kg), SpO2 92.00%. Physical Exam Well-developed well-nourished patient in no acute distress. Alert and oriented x3 HEENT:within normal limits Cardiac: Regular rate and rhythm Pulmonary: Lungs clear to auscultation Abdomen: Soft and nontender.  Normal active bowel sounds  Musculoskeletal: left knee: Full range of motion.  Trace if any effusion.  No instability.  There's tenderness palpation over the posterior medial joint line with extreme knee flexion. Assessment/Plan: 48year old male patient with left knee pain after twisting injury.  The patient is complaining of difficulty standing.  Knee examination has no signs or symptoms concerning for sepsis at this point.//We will obtain plain x-rays of the knee  and continue to monitor the patient.  If he continues to hurt I would consider MRI examination of the knee.  Even though the patient has pneumonia and concerns for vegetation on a heart valve, there are no signs and symptoms reparable to septic joint at this point.  We will continue to follow patient and as outlined will order MRI should the symptoms not start to resolve.  I will followup with review of  plain x-rays as well.  Shadd Dunstan L 03/23/2014, 6:58 PM

## 2014-03-23 NOTE — Consult Note (Signed)
CARDIOLOGY CONSULT NOTE  Patient ID: Timothy Palmer MRN: [id number] DOB/AGE: 1965-07-25 48 y.o.  Admit date: 03/22/2014 Reason for Consultation: MR, endocarditis  HPI: 49 yo with history of ESRD x 10 years (hypertensive nephropathy) was admitted with fever/chills and dyspnea.  He was in his usual state of health until Monday.  On Monday, he noted dyspnea mowing his grass.  This progressed to where he could not walk 1 block without having to stop.  He noted orthopnea.  Yesterday he had fevers/chills and felt short of breath at dialysis.  He came to the ER.  CTA chest with no PE but there was evidence for pulmonary edema.  He was febrile to 101.  Blood cultures were drawn and he was started on antibiotics for ?PNA.  Given dyspnea, he had an echo done today.  This showed a small vegetation on the posterior leaflet possible moderate to severe MR (not well-visualized), leading to concern for endocarditis with valve perforation.  Patient is currently hypertensive and afebrile.   Review of systems complete and found to be negative unless listed above in HPI  Past Medical History: 1. ESRD: Hypertensive nephropathy, TTS HD. 2. HTN  Scheduled Meds: . amLODipine  5 mg Oral Daily  . [START ON 03/24/2014] ceFEPime (MAXIPIME) IV  2 g Intravenous Q T,Th,Sat-1800  . cinacalcet  60 mg Oral QPC supper  . [START ON 03/24/2014] darbepoetin (ARANESP) injection - DIALYSIS  40 mcg Intravenous Q Thu-HD  . heparin  5,000 Units Subcutaneous 3 times per day  . lisinopril  20 mg Oral Daily  . sevelamer carbonate  800 mg Oral TID WC  . sodium chloride  3 mL Intravenous Q12H  . sodium chloride  3 mL Intravenous Q12H  . [START ON 03/24/2014] vancomycin  1,000 mg Intravenous Q T,Th,Sa-HD   Continuous Infusions:  PRN Meds:.sodium chloride, sodium chloride   Family History  Problem Relation Age of Onset  . Diabetes Mother   . Hypertension Mother   . Hypertension Father   . Hypertension Sister   . Hypertension  Brother     History   Social History  . Marital Status: Single    Spouse Name: N/A    Number of Children: N/A  . Years of Education: N/A   Occupational History  . Not on file.   Social History Main Topics  . Smoking status: Never Smoker   . Smokeless tobacco: Never Used  . Alcohol Use: Yes  . Drug Use: Yes    Special: Marijuana     Comment: last used 03/20/14  . Sexual Activity: Not on file   Other Topics Concern  . Not on file   Social History Narrative  . No narrative on file     Prescriptions prior to admission  Medication Sig Dispense Refill  . cinacalcet (SENSIPAR) 60 MG tablet Take 60 mg by mouth daily.      Marland Kitchen lisinopril (PRINIVIL,ZESTRIL) 20 MG tablet Take 20 mg by mouth daily.      Marland Kitchen oxyCODONE-acetaminophen (PERCOCET/ROXICET) 5-325 MG per tablet Take 1-2 tablets by mouth every 4 (four) hours as needed for severe pain.  20 tablet  0  . sevelamer carbonate (RENVELA) 800 MG tablet Take 800 mg by mouth 3 (three) times daily with meals.        Physical exam Blood pressure 164/107, pulse 102, temperature 99.3 F (37.4 C), temperature source Oral, resp. rate 23, height 5\' 11"  (1.803 m), weight 223 lb 9.6 oz (101.424 kg), SpO2 92.00%.  General: NAD Neck: JVP 10 cm, no thyromegaly or thyroid nodule.  Lungs: Clear to auscultation bilaterally with normal respiratory effort. CV: Nondisplaced PMI.  Heart regular S1/S2, no S3/S4, 3/6 HSM apex.  No peripheral edema.  No carotid bruit.  Normal pedal pulses.  Abdomen: Soft, nontender, no hepatosplenomegaly, no distention.  Skin: Intact without lesions or rashes.  Neurologic: Alert and oriented x 3.  Psych: Normal affect. Extremities: No clubbing or cyanosis.  HEENT: Normal.   Labs:   Lab Results  Component Value Date   WBC 11.9* 03/23/2014   HGB 8.4* 03/23/2014   HCT 25.6* 03/23/2014   MCV 95.9 03/23/2014   PLT 187 03/23/2014    Recent Labs Lab 03/23/14 0014  NA 138  K 4.2  CL 93*  CO2 31  BUN 21  CREATININE 8.15*   CALCIUM 10.6*  GLUCOSE 106*   Lab Results  Component Value Date   TROPONINI <0.30 03/23/2014    Radiology: - CTA chest: No PE, interstitial thickening suggestive of CHF.   EKG: NSR, inferior T wave flattening  ASSESSMENT AND PLAN: 48 yo with history of ESRD x 10 years (hypertensive nephropathy) was admitted with fever/chills and dyspnea.  1. 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  Source most likely dialysis access.  Blood cultures are so far negative. He is on broad spectrum antibiotics.   - He will need ID consult.  - I will arrange TEE to get a better look at the valve.  He has a loud MR murmur on exam and appears to have at least moderate to severe MR on echo.  I am concerned for possible perforation.  It is possible that he could need surgical intervention given the presence of CHF (see below).  2. CHF: Acute diastolic CHF likely due to mitral regurgitation.  He has a loud MR murmur.  He is volume overloaded on exam.  Increased dyspnea recently likely related to onset of MR.   - HD for volume removal.  - Would control afterload (lower blood pressure).  Agree with adding amlodipine.   Signed: Loralie Champagne 03/23/2014 1:05 PM

## 2014-03-23 NOTE — Progress Notes (Signed)
  Echocardiogram 2D Echocardiogram has been performed.  Mauricio Po 03/23/2014, 10:17 AM

## 2014-03-23 NOTE — Progress Notes (Signed)
  Date: 03/23/2014  Patient name: Timothy Palmer  Medical record number: [id number]  Date of birth: 03-15-1966   This patient has been seen and the plan of care was discussed with the house staff. Please see their note for complete details. I concur with their findings with the following additions/corrections:  1. Sepsis - Renal indicates that pt had + blood cultures 8/20 - staph aureus and has been treated with cefazolin 2 gr q HD with stop date 10/20. These cx are not in EPIC so reason for cxs not known although pt described a 9 day stomach virus 2 weeks ago that resulted in a 14 lb weight loss. Pt now admitted with sepsis - temp, leukocytosis, tachypnea. Blood cx x 4 obtained but so far negative and likely will be affected by ongoing ABX use. 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 Cards is arranging TEE. ID will be consulted.   2. Acute diastolic heart failure - pt has not missed any HD sessions and states dry weight not altered. Cards is considering possible perforation. He is sch for routine HD tomorrow.   3. L knee pain - MRI had originally been ordered. After new info, and discussion with [facility] [place], we will cancel the MRI and ask ortho to see pt 2/2 concern for septic joint but cx (if ortho feels arthroscopy needed) will be limited 2/2 prior ABX.     Bartholomew Crews, MD 03/23/2014, 3:20 PM

## 2014-03-23 NOTE — Progress Notes (Signed)
UR complete. Aaryn Sermon RN CCM Case Mgmt phone [phone] 

## 2014-03-23 NOTE — Consult Note (Addendum)
[facility]  Date of Admission:  03/22/2014  Date of Consult:  03/23/2014  Reason for Consult: MV endocarditis. Staph Aureus bacteremia 02-24-14 Referring Physician: Ernesta Amble  Impression/Recommendation MV Endocarditis Staph bacteremia ESRD  Would- No change in anbx for now.  Repeat BCx Possible CVTS eval for incompetent valve? Discuss with Renal to see if we can get results of his previous BCx  Comment- I called [phone] to try to get his previous BCx results and more info about his hx. There was no answer. Will f/u tomorrow.  I am less convinced that he has pneumonia than he has endocarditis with valve dysfunction, fluid overload. HD tomorrow.    Thank you so much for this interesting consult,   Bobby Rumpf (pager) [phone] [email or web address]  Timothy Palmer is an 48 y.o. male.  HPI: 48 yo M with hx of staph bacteremia dx 8-20 for which he has been receiving ancef at HD. He states he also had staph bacteremia May 2015. He comes to ED on 9-15 with 3 weeks of productive cough, DOE, orthopnea and chills (on day PTA). He was afebrile, he had a WBC 12.3. He had a CT scan which was felt to be most likely consistent with fluid overload and a reactive LN. He was started on vanco/cefepime. His temp has increased to 101.  He underwent TTE today showing MV valve lesion with mod-severe regurg.     Past Medical History  Diagnosis Date  . Hypertension   . Peripheral vascular disease   . Pneumonia 2014  . ESRD (end stage renal disease) on dialysis     [place],Dialysis- T,Th,S    Past Surgical History  Procedure Laterality Date  . Av fistula placement Left ?2005    forearm   . Shuntogram Left November 04, 2011  . Patch angioplasty Left 07/23/2013    Procedure: PATCH ANGIOPLASTY- LEFT RADIOCEPHALIC ARTERIOVENOUS FISTULA;  Surgeon: Angelia Mould, MD;  Location: [facility];  Service: Vascular;  Laterality: Left;     No Known  Allergies  Medications:  Scheduled: . amLODipine  10 mg Oral Daily  . [START ON 03/24/2014] ceFEPime (MAXIPIME) IV  2 g Intravenous Q T,Th,Sat-1800  . cinacalcet  60 mg Oral QPC supper  . [START ON 03/24/2014] darbepoetin (ARANESP) injection - DIALYSIS  40 mcg Intravenous Q Thu-HD  . heparin  5,000 Units Subcutaneous 3 times per day  . lisinopril  20 mg Oral Daily  . sevelamer carbonate  800 mg Oral TID WC  . sodium chloride  3 mL Intravenous Q12H  . sodium chloride  3 mL Intravenous Q12H  . [START ON 03/24/2014] vancomycin  1,000 mg Intravenous Q T,Th,Sa-HD    Abtx:  Anti-infectives   Start     Dose/Rate Route Frequency Ordered Stop   03/24/14 1800  ceFEPIme (MAXIPIME) 2 g in dextrose 5 % 50 mL IVPB     2 g 100 mL/hr over 30 Minutes Intravenous Every T-Th-Sa (1800) 03/23/14 1108     03/24/14 1200  ceFEPIme (MAXIPIME) 2 g in dextrose 5 % 50 mL IVPB  Status:  Discontinued     2 g 100 mL/hr over 30 Minutes Intravenous Every T-Th-Sa (Hemodialysis) 03/22/14 1355 03/23/14 1108   03/24/14 1200  vancomycin (VANCOCIN) IVPB 1000 mg/200 mL premix     1,000 mg 200 mL/hr over 60 Minutes Intravenous Every T-Th-Sa (Hemodialysis) 03/22/14 2232     03/22/14 1400  vancomycin (VANCOCIN) 1,750 mg in sodium chloride 0.9 % 500  mL IVPB     1,750 mg 250 mL/hr over 120 Minutes Intravenous  Once 03/22/14 1346 03/22/14 1720   03/22/14 1400  ceFEPIme (MAXIPIME) 2 g in dextrose 5 % 50 mL IVPB     2 g 100 mL/hr over 30 Minutes Intravenous  Once 03/22/14 1347 03/22/14 1521   03/22/14 1345  vancomycin (VANCOCIN) 15 mg/kg in sodium chloride 0.9 % 100 mL IVPB  Status:  Discontinued     15 mg/kg 100 mL/hr over 60 Minutes Intravenous  Once 03/22/14 1342 03/22/14 1345      Total days of antibiotics 1 (vanco/cefepime)  Social History:  reports that he has never smoked. He has never used smokeless tobacco. He reports that he drinks alcohol. He reports that he uses illicit drugs (Marijuana).  Family History    Problem Relation Age of Onset  . Diabetes Mother   . Hypertension Mother   . Hypertension Father   . Hypertension Sister   . Hypertension Brother     General ROS: no LE edema, +sweats, 15# wt loss, no dysphagia, decreased appetite, no problems with HD site, no vision change. see HPI.   Blood pressure 166/106, pulse 102, temperature 99.3 F (37.4 C), temperature source Oral, resp. rate 23, height 5' 11" (1.803 m), weight 101.424 kg (223 lb 9.6 oz), SpO2 90.00%. General appearance: alert, cooperative and no distress Eyes: negative findings: pupils equal, round, reactive to light and accomodation Throat: normal findings: oropharynx pink & moist without lesions or evidence of thrush Neck: no adenopathy and supple, symmetrical, trachea midline Lungs: clear to auscultation bilaterally Heart: regular rate and rhythm and systolic murmur: systolic ejection 3/6, crescendo at 2nd left intercostal space Abdomen: normal findings: bowel sounds normal and soft, non-tender and abnormal findings:  distended Extremities: edema none and no nail bed lesions + Bruit LUE fistula  Results for orders placed during the hospital encounter of 03/22/14 (from the past 48 hour(s))  BASIC METABOLIC PANEL     Status: Abnormal   Collection Time    03/22/14 11:42 AM      Result Value Ref Range   Sodium 140  137 - 147 mEq/L   Potassium 3.6 (*) 3.7 - 5.3 mEq/L   Chloride 94 (*) 96 - 112 mEq/L   CO2 33 (*) 19 - 32 mEq/L   Glucose, Bld 105 (*) 70 - 99 mg/dL   BUN 12  6 - 23 mg/dL   Creatinine, Ser 5.85 (*) 0.50 - 1.35 mg/dL   Calcium 9.8  8.4 - 10.5 mg/dL   GFR calc non Af Amer 10 (*) >90 mL/min   GFR calc Af Amer 12 (*) >90 mL/min   Comment: (NOTE)     The eGFR has been calculated using the CKD EPI equation.     This calculation has not been validated in all clinical situations.     eGFR's persistently <90 mL/min signify possible Chronic Kidney     Disease.   Anion gap 13  5 - 15  CBC     Status: Abnormal    Collection Time    03/22/14 11:42 AM      Result Value Ref Range   WBC 12.3 (*) 4.0 - 10.5 K/uL   RBC 3.04 (*) 4.22 - 5.81 MIL/uL   Hemoglobin 9.2 (*) 13.0 - 17.0 g/dL   HCT 28.2 (*) 39.0 - 52.0 %   MCV 92.8  78.0 - 100.0 fL   MCH 30.3  26.0 - 34.0 pg   MCHC 32.6  30.0 - 36.0 g/dL   RDW 15.1  11.5 - 15.5 %   Platelets 215  150 - 400 K/uL  PRO B NATRIURETIC PEPTIDE     Status: Abnormal   Collection Time    03/22/14 11:42 AM      Result Value Ref Range   Pro B Natriuretic peptide (BNP) 30804.0 (*) 0 - 125 pg/mL  I-STAT TROPOININ, ED     Status: None   Collection Time    03/22/14 11:49 AM      Result Value Ref Range   Troponin i, poc 0.08  0.00 - 0.08 ng/mL   Comment 3            Comment: Due to the release kinetics of cTnI,     a negative result within the first hours     of the onset of symptoms does not rule out     myocardial infarction with certainty.     If myocardial infarction is still suspected,     repeat the test at appropriate intervals.  MAGNESIUM     Status: None   Collection Time    03/22/14  1:49 PM      Result Value Ref Range   Magnesium 1.8  1.5 - 2.5 mg/dL  CULTURE, BLOOD (ROUTINE X 2)     Status: None   Collection Time    03/22/14  1:52 PM      Result Value Ref Range   Specimen Description BLOOD ARM RIGHT     Special Requests BOTTLES DRAWN AEROBIC ONLY 10CC     Culture  Setup Time       Value: 03/22/2014 18:55     Performed at [facility]   Culture       Value:        BLOOD CULTURE RECEIVED NO GROWTH TO DATE CULTURE WILL BE HELD FOR 5 DAYS BEFORE ISSUING A FINAL NEGATIVE REPORT     Performed at [facility]   Report Status PENDING    CULTURE, BLOOD (ROUTINE X 2)     Status: None   Collection Time    03/22/14  2:00 PM      Result Value Ref Range   Specimen Description BLOOD HAND RIGHT     Special Requests BOTTLES DRAWN AEROBIC AND ANAEROBIC 10CC     Culture  Setup Time       Value: 03/22/2014 18:56     Performed at [facility]   Culture       Value:        BLOOD CULTURE RECEIVED NO GROWTH TO DATE CULTURE WILL BE HELD FOR 5 DAYS BEFORE ISSUING A FINAL NEGATIVE REPORT     Performed at [facility]   Report Status PENDING    TROPONIN I     Status: None   Collection Time    03/22/14  7:22 PM      Result Value Ref Range   Troponin I <0.30  <0.30 ng/mL   Comment:            Due to the release kinetics of cTnI,     a negative result within the first hours     of the onset of symptoms does not rule out     myocardial infarction with certainty.     If myocardial infarction is still suspected,     repeat the test at appropriate intervals.  TSH     Status: None   Collection Time  03/22/14  7:22 PM      Result Value Ref Range   TSH 0.901  0.350 - 4.500 uIU/mL  PHOSPHORUS     Status: Abnormal   Collection Time    03/22/14  7:22 PM      Result Value Ref Range   Phosphorus 5.9 (*) 2.3 - 4.6 mg/dL  INFLUENZA PANEL BY PCR (TYPE A & B, H1N1)     Status: None   Collection Time    03/22/14  9:22 PM      Result Value Ref Range   Influenza A By PCR NEGATIVE  NEGATIVE   Influenza B By PCR NEGATIVE  NEGATIVE   H1N1 flu by pcr NOT DETECTED  NOT DETECTED   Comment:            The Xpert Flu assay (FDA approved for     nasal aspirates or washes and     nasopharyngeal swab specimens), is     intended as an aid in the diagnosis of     influenza and should not be used as     a sole basis for treatment.  BASIC METABOLIC PANEL     Status: Abnormal   Collection Time    03/23/14 12:14 AM      Result Value Ref Range   Sodium 138  137 - 147 mEq/L   Potassium 4.2  3.7 - 5.3 mEq/L   Chloride 93 (*) 96 - 112 mEq/L   CO2 31  19 - 32 mEq/L   Glucose, Bld 106 (*) 70 - 99 mg/dL   BUN 21  6 - 23 mg/dL   Creatinine, Ser 8.15 (*) 0.50 - 1.35 mg/dL   Calcium 10.6 (*) 8.4 - 10.5 mg/dL   GFR calc non Af Amer 7 (*) >90 mL/min   GFR calc Af Amer 8 (*) >90 mL/min   Comment: (NOTE)     The eGFR has been calculated  using the CKD EPI equation.     This calculation has not been validated in all clinical situations.     eGFR's persistently <90 mL/min signify possible Chronic Kidney     Disease.   Anion gap 14  5 - 15  CBC     Status: Abnormal   Collection Time    03/23/14 12:14 AM      Result Value Ref Range   WBC 11.9 (*) 4.0 - 10.5 K/uL   RBC 2.67 (*) 4.22 - 5.81 MIL/uL   Hemoglobin 8.4 (*) 13.0 - 17.0 g/dL   HCT 25.6 (*) 39.0 - 52.0 %   MCV 95.9  78.0 - 100.0 fL   MCH 31.5  26.0 - 34.0 pg   MCHC 32.8  30.0 - 36.0 g/dL   RDW 15.3  11.5 - 15.5 %   Platelets 187  150 - 400 K/uL  TROPONIN I     Status: None   Collection Time    03/23/14 12:14 AM      Result Value Ref Range   Troponin I <0.30  <0.30 ng/mL   Comment:            Due to the release kinetics of cTnI,     a negative result within the first hours     of the onset of symptoms does not rule out     myocardial infarction with certainty.     If myocardial infarction is still suspected,     repeat the test at appropriate intervals.  HIV ANTIBODY (ROUTINE TESTING)  Status: None   Collection Time    03/23/14 12:14 AM      Result Value Ref Range   HIV 1&2 Ab, 4th Generation NONREACTIVE  NONREACTIVE   Comment: (NOTE)     A NONREACTIVE HIV Ag/Ab result does not exclude HIV infection since     the time frame for seroconversion is variable. If acute HIV infection     is suspected, a HIV-1 RNA Qualitative TMA test is recommended.     HIV-1/2 Antibody Diff         Not indicated.     HIV-1 RNA, Qual TMA           Not indicated.     PLEASE NOTE: This information has been disclosed to you from records     whose confidentiality may be protected by state law. If your state     requires such protection, then the state law prohibits you from making     any further disclosure of the information without the specific written     consent of the person to whom it pertains, or as otherwise permitted     by law. A general authorization for the  release of medical or other     information is NOT sufficient for this purpose.     The performance of this assay has not been clinically validated in     patients less than 36 years old.     Performed at [facility]  TROPONIN I     Status: None   Collection Time    03/23/14  6:12 AM      Result Value Ref Range   Troponin I <0.30  <0.30 ng/mL   Comment:            Due to the release kinetics of cTnI,     a negative result within the first hours     of the onset of symptoms does not rule out     myocardial infarction with certainty.     If myocardial infarction is still suspected,     repeat the test at appropriate intervals.  PROCALCITONIN     Status: None   Collection Time    03/23/14 10:17 AM      Result Value Ref Range   Procalcitonin 4.19     Comment:            Interpretation:     PCT > 2 ng/mL:     Systemic infection (sepsis) is likely,     unless other causes are known.     (NOTE)             ICU PCT Algorithm               Non ICU PCT Algorithm        ----------------------------     ------------------------------             PCT < 0.25 ng/mL                 PCT < 0.1 ng/mL         Stopping of antibiotics            Stopping of antibiotics           strongly encouraged.               strongly encouraged.        ----------------------------     ------------------------------  PCT level decrease by               PCT < 0.25 ng/mL           >= 80% from peak PCT           OR PCT 0.25 - 0.5 ng/mL          Stopping of antibiotics                                                 encouraged.         Stopping of antibiotics               encouraged.        ----------------------------     ------------------------------           PCT level decrease by              PCT >= 0.25 ng/mL           < 80% from peak PCT            AND PCT >= 0.5 ng/mL            Continuing antibiotics                                                  encouraged.           Continuing  antibiotics                encouraged.        ----------------------------     ------------------------------         PCT level increase compared          PCT > 0.5 ng/mL             with peak PCT AND              PCT >= 0.5 ng/mL             Escalation of antibiotics                                              strongly encouraged.          Escalation of antibiotics            strongly encouraged.  STREP PNEUMONIAE URINARY ANTIGEN     Status: None   Collection Time    03/23/14 12:31 PM      Result Value Ref Range   Strep Pneumo Urinary Antigen NEGATIVE  NEGATIVE   Comment:            Infection due to S. pneumoniae     cannot be absolutely ruled out     since the antigen present     may be below the detection limit     of the test.      Component Value Date/Time   SDES BLOOD HAND RIGHT 03/22/2014 1400   SPECREQUEST BOTTLES DRAWN AEROBIC AND ANAEROBIC 10CC 03/22/2014 1400   CULT  Value:        BLOOD CULTURE RECEIVED NO  GROWTH TO DATE CULTURE WILL BE HELD FOR 5 DAYS BEFORE ISSUING A FINAL NEGATIVE REPORT Performed at [facility] 03/22/2014 1400   REPTSTATUS PENDING 03/22/2014 1400   Dg Chest 2 View  03/23/2014   CLINICAL DATA:  Weakness, leg pain  EXAM: CHEST  2 VIEW  COMPARISON:  03/22/2014  FINDINGS: Borderline cardiomegaly. Small right pleural effusion with right basilar atelectasis. Central mild vascular congestion and mild perihilar interstitial prominence suspicious for mild interstitial edema. Bony thorax is unremarkable.  IMPRESSION: Small right pleural effusion with right basilar atelectasis. Central mild vascular congestion and mild perihilar interstitial prominence suspicious for mild interstitial edema. No segmental infiltrate.   Electronically Signed   By: Lahoma Crocker M.D.   On: 03/23/2014 09:04   Dg Chest 2 View  03/22/2014   CLINICAL DATA:  Shortness of breath  EXAM: CHEST  2 VIEW  COMPARISON:  07/23/2013  FINDINGS: There is no focal parenchymal opacity, pleural  effusion, or pneumothorax. Mild pulmonary vascular congestion. There is mild cardiomegaly.  The osseous structures are unremarkable.  IMPRESSION: Mild pulmonary vascular congestion. Otherwise no active cardiopulmonary disease.   Electronically Signed   By: Kathreen Devoid   On: 03/22/2014 12:14   Ct Angio Chest W/cm &/or Wo Cm  03/22/2014   CLINICAL DATA:  Short of breath.  Cough.  EXAM: CT ANGIOGRAPHY CHEST WITH CONTRAST  TECHNIQUE: Multidetector CT imaging of the chest was performed using the standard protocol during bolus administration of intravenous contrast. Multiplanar CT image reconstructions and MIPs were obtained to evaluate the vascular anatomy.  CONTRAST:  55m OMNIPAQUE IOHEXOL 350 MG/ML SOLN  COMPARISON:  09/12/2012  FINDINGS: No evidence of a pulmonary embolus.  Heart is mildly enlarged. There are minor coronary artery calcifications. Great vessels are normal in caliber.  There is mediastinal adenopathy. Multiple reference measurements were made. There is a 14 mm short axis node in the right superior mediastinum. There is a 19 mm short axis prevascular lymph node adjacent the left pulmonary artery. There is a 15 mm right subcarinal lymph node. There are several other prominent to mildly enlarged lymph nodes. Although there were prominent nodes previously, the larger nodes have increased in size. There are prominent hilar lymph nodes not well defined, and without change.  Small right pleural effusion.  No left effusion.  Stable calcified pleural plaque over the right hemidiaphragm.  Lungs show mild diffuse interstitial thickening and hazy and somewhat heterogeneous airspace opacities which have a central to upper lobe predominance. Interstitial thickening is greatest in the lung bases.  Limited evaluation of the upper abdomen shows gallstones but is otherwise unremarkable.  No osteoblastic or osteolytic lesions.  Review of the MIP images confirms the above findings.  IMPRESSION: 1. No evidence of a  pulmonary embolism. 2. Mild cardiomegaly, interstitial thickening and hazy areas of airspace lung opacity, as well as a small right effusion, all suggests congestive heart failure. However, there also prominent and mildly enlarged mediastinal lymph nodes. These are likely reactive. The possibility of lung infection should be considered in the proper clinical setting.   Electronically Signed   By: DLajean ManesM.D.   On: 03/22/2014 13:09   Recent Results (from the past 240 hour(s))  CULTURE, BLOOD (ROUTINE X 2)     Status: None   Collection Time    03/22/14  1:52 PM      Result Value Ref Range Status   Specimen Description BLOOD ARM RIGHT   Final   Special Requests BOTTLES DRAWN  AEROBIC ONLY 10CC   Final   Culture  Setup Time     Final   Value: 03/22/2014 18:55     Performed at [facility]   Culture     Final   Value:        BLOOD CULTURE RECEIVED NO GROWTH TO DATE CULTURE WILL BE HELD FOR 5 DAYS BEFORE ISSUING A FINAL NEGATIVE REPORT     Performed at [facility]   Report Status PENDING   Incomplete  CULTURE, BLOOD (ROUTINE X 2)     Status: None   Collection Time    03/22/14  2:00 PM      Result Value Ref Range Status   Specimen Description BLOOD HAND RIGHT   Final   Special Requests BOTTLES DRAWN AEROBIC AND ANAEROBIC 10CC   Final   Culture  Setup Time     Final   Value: 03/22/2014 18:56     Performed at [facility]   Culture     Final   Value:        BLOOD CULTURE RECEIVED NO GROWTH TO DATE CULTURE WILL BE HELD FOR 5 DAYS BEFORE ISSUING A FINAL NEGATIVE REPORT     Performed at [facility]   Report Status PENDING   Incomplete      03/23/2014, 5:10 PM     LOS: 1 day

## 2014-03-23 NOTE — Consult Note (Signed)
Indication for Consultation:  Management of ESRD/hemodialysis; anemia, hypertension/volume and secondary hyperparathyroidism  HPI: Timothy Palmer is a 48 y.o. male who presented to the ED yesterday after HD for evaluation of L posterior knee pain, sob and cough. He receives HD TTS @ [place]. He had recently been seen in the ED 8/28- for a DVT in R upper arm, anticoagulation was not recommended. He reports cough with clear sputum for 3 weeks, denies fever and chills. Small right pleural effusion on xray, is being treated for possible HCAP.  He had + blood cultures 8/20 - staph aureus and has been treated with cefazolin 2 gr q HD, stop date 10/20.  Today he reports hot flashes but otherwise is feeling well. Will arrange HD tomorrow.   Past Medical History  Diagnosis Date  . Hypertension   . Peripheral vascular disease   . Renal disorder     [place],Dialysis- T,Th,S  . Renal insufficiency    Past Surgical History  Procedure Laterality Date  . Av fistula placement Left     forearm   . Shuntogram Left November 04, 2011  . Patch angioplasty Left 07/23/2013    Procedure: PATCH ANGIOPLASTY- LEFT RADIOCEPHALIC ARTERIOVENOUS FISTULA;  Surgeon: Angelia Mould, MD;  Location: [facility] OR;  Service: Vascular;  Laterality: Left;   Family History  Problem Relation Age of Onset  . Diabetes Mother   . Hypertension Mother   . Hypertension Father   . Hypertension Sister   . Hypertension Brother    Social History:  reports that he has never smoked. He has never used smokeless tobacco. He reports that he drinks alcohol. He reports that he uses illicit drugs (Marijuana). No Known Allergies Prior to Admission medications   Medication Sig Start Date End Date Taking? Authorizing Provider  cinacalcet (SENSIPAR) 60 MG tablet Take 60 mg by mouth daily.   Yes Historical Provider, MD  lisinopril (PRINIVIL,ZESTRIL) 20 MG tablet Take 20 mg by mouth daily.   Yes Historical Provider, MD  oxyCODONE-acetaminophen  (PERCOCET/ROXICET) 5-325 MG per tablet Take 1-2 tablets by mouth every 4 (four) hours as needed for severe pain. 03/04/14  Yes Virgel Manifold, MD  sevelamer carbonate (RENVELA) 800 MG tablet Take 800 mg by mouth 3 (three) times daily with meals.   Yes Historical Provider, MD   Current Facility-Administered Medications  Medication Dose Route Frequency Provider Last Rate Last Dose  . 0.9 %  sodium chloride infusion  250 mL Intravenous PRN Otho Bellows, MD      . Derrill Memo ON 03/24/2014] ceFEPIme (MAXIPIME) 2 g in dextrose 5 % 50 mL IVPB  2 g Intravenous Q T,Th,Sa-HD Ephraim Hamburger, MD      . heparin injection 5,000 Units  5,000 Units Subcutaneous 3 times per day Otho Bellows, MD   5,000 Units at 03/23/14 0546  . lisinopril (PRINIVIL,ZESTRIL) tablet 20 mg  20 mg Oral Daily Otho Bellows, MD   20 mg at 03/23/14 1013  . sevelamer carbonate (RENVELA) tablet 800 mg  800 mg Oral TID WC Otho Bellows, MD   800 mg at 03/23/14 1013  . sodium chloride 0.9 % injection 3 mL  3 mL Intravenous Q12H Otho Bellows, MD      . sodium chloride 0.9 % injection 3 mL  3 mL Intravenous Q12H Otho Bellows, MD      . sodium chloride 0.9 % injection 3 mL  3 mL Intravenous PRN Otho Bellows, MD      . [  START ON 03/24/2014] vancomycin (VANCOCIN) IVPB 1000 mg/200 mL premix  1,000 mg Intravenous Q T,Th,Sa-HD Arman Bogus, [facility]       Labs: Basic Metabolic Panel:  Recent Labs Lab 03/22/14 1142 03/22/14 1922 03/23/14 0014  NA 140  --  138  K 3.6*  --  4.2  CL 94*  --  93*  CO2 33*  --  31  GLUCOSE 105*  --  106*  BUN 12  --  21  CREATININE 5.85*  --  8.15*  CALCIUM 9.8  --  10.6*  PHOS  --  5.9*  --    Liver Function Tests: No results found for this basename: AST, ALT, ALKPHOS, BILITOT, PROT, ALBUMIN,  in the last 168 hours No results found for this basename: LIPASE, AMYLASE,  in the last 168 hours No results found for this basename: AMMONIA,  in the last 168 hours CBC:  Recent Labs Lab  03/22/14 1142 03/23/14 0014  WBC 12.3* 11.9*  HGB 9.2* 8.4*  HCT 28.2* 25.6*  MCV 92.8 95.9  PLT 215 187   Cardiac Enzymes:  Recent Labs Lab 03/22/14 1922 03/23/14 0014 03/23/14 0612  TROPONINI <0.30 <0.30 <0.30   CBG: No results found for this basename: GLUCAP,  in the last 168 hours Iron Studies: No results found for this basename: IRON, TIBC, TRANSFERRIN, FERRITIN,  in the last 72 hours Studies/Results: Dg Chest 2 View  03/23/2014   CLINICAL DATA:  Weakness, leg pain  EXAM: CHEST  2 VIEW  COMPARISON:  03/22/2014  FINDINGS: Borderline cardiomegaly. Small right pleural effusion with right basilar atelectasis. Central mild vascular congestion and mild perihilar interstitial prominence suspicious for mild interstitial edema. Bony thorax is unremarkable.  IMPRESSION: Small right pleural effusion with right basilar atelectasis. Central mild vascular congestion and mild perihilar interstitial prominence suspicious for mild interstitial edema. No segmental infiltrate.   Electronically Signed   By: Lahoma Crocker M.D.   On: 03/23/2014 09:04   Dg Chest 2 View  03/22/2014   CLINICAL DATA:  Shortness of breath  EXAM: CHEST  2 VIEW  COMPARISON:  07/23/2013  FINDINGS: There is no focal parenchymal opacity, pleural effusion, or pneumothorax. Mild pulmonary vascular congestion. There is mild cardiomegaly.  The osseous structures are unremarkable.  IMPRESSION: Mild pulmonary vascular congestion. Otherwise no active cardiopulmonary disease.   Electronically Signed   By: Kathreen Devoid   On: 03/22/2014 12:14   Ct Angio Chest W/cm &/or Wo Cm  03/22/2014   CLINICAL DATA:  Short of breath.  Cough.  EXAM: CT ANGIOGRAPHY CHEST WITH CONTRAST  TECHNIQUE: Multidetector CT imaging of the chest was performed using the standard protocol during bolus administration of intravenous contrast. Multiplanar CT image reconstructions and MIPs were obtained to evaluate the vascular anatomy.  CONTRAST:  66mL OMNIPAQUE IOHEXOL 350  MG/ML SOLN  COMPARISON:  09/12/2012  FINDINGS: No evidence of a pulmonary embolus.  Heart is mildly enlarged. There are minor coronary artery calcifications. Great vessels are normal in caliber.  There is mediastinal adenopathy. Multiple reference measurements were made. There is a 14 mm short axis node in the right superior mediastinum. There is a 19 mm short axis prevascular lymph node adjacent the left pulmonary artery. There is a 15 mm right subcarinal lymph node. There are several other prominent to mildly enlarged lymph nodes. Although there were prominent nodes previously, the larger nodes have increased in size. There are prominent hilar lymph nodes not well defined, and without change.  Small right pleural  effusion.  No left effusion.  Stable calcified pleural plaque over the right hemidiaphragm.  Lungs show mild diffuse interstitial thickening and hazy and somewhat heterogeneous airspace opacities which have a central to upper lobe predominance. Interstitial thickening is greatest in the lung bases.  Limited evaluation of the upper abdomen shows gallstones but is otherwise unremarkable.  No osteoblastic or osteolytic lesions.  Review of the MIP images confirms the above findings.  IMPRESSION: 1. No evidence of a pulmonary embolism. 2. Mild cardiomegaly, interstitial thickening and hazy areas of airspace lung opacity, as well as a small right effusion, all suggests congestive heart failure. However, there also prominent and mildly enlarged mediastinal lymph nodes. These are likely reactive. The possibility of lung infection should be considered in the proper clinical setting.   Electronically Signed   By: Lajean Manes M.D.   On: 03/22/2014 13:09   Review of Systems: L posterior knee pain 3 weeks of productive cough with clear sputum.  Denies fever or chills. Denies chest pain. Reports DOE, denies orthopnea.  . Physical Exam: Filed Vitals:   03/22/14 1611 03/22/14 1815 03/22/14 2100 03/23/14 0700   BP: 143/85 147/94 153/86 166/89  Pulse: 100 100 105 103  Temp:  98.6 F (37 C) 99 F (37.2 C) 100.2 F (37.9 C)  TempSrc:  Oral Oral Oral  Resp: 21 20 22 26   Height:   5\' 11"  (1.803 m)   Weight:   101.424 kg (223 lb 9.6 oz)   SpO2: 100% 92% 98% 96%     General: Well developed, well nourished, in no acute distress. Diaphoretic.  Head: Normocephalic, atraumatic, sclera non-icteric, mucus membranes are moist Neck: Supple. JVD not elevated. Lungs: Dim bases. Breathing is unlabored. Heart: RRR 2/6 systolic murmur Abdomen: Soft, obese non-tender, non-distended with normoactive bowel sounds. No rebound/guarding. No obvious abdominal masses. M-S:  Strength and tone appear normal for age. Lower extremities:without edema or ischemic changes, no open wounds. Neuro: Alert and oriented X 3. Moves all extremities spontaneously. Psych:  Responds to questions appropriately with a normal affect. Dialysis Access: L AVF +b/t  Dialysis Orders:  TTS EAST 4 hr  103kgs   2K/2Ca  9000 bolus and 1500 mid Heparin  500/1.5     hectorol 9 mch   Aranesp 25 mcg      Assessment/Plan: 1.  HCAP- small pleural effusion on xray. Echo pending. Troponin negative. Sputum culture pending. Blood cultures pending- is on antibx outpt 2. L leg pain.- No evidence of DVT. Knee MRI pending and may benefit from Ortho evaluation (?septic joint) 3. + blood cultures from 8/20- cefazolin 2gr q HD through 10/20 4.  ESRD -  TTS @ [place]- HD tomorrow.  K+ 4.2 5.  Hypertension/volume  - 166/89. Home amlodipine and lisinopril. No volume excess. EDW looks ok.  6.  Anemia  - hgb 8.4. Cont ESA q Thursday. No Fe.  7.  Metabolic bone disease -  Ca+ 10.6. Phos 5.9  Renvela. Sensipar. - hold hectorol  8.  Nutrition - alb 3.9 renal diet.  9. Fevers/Mitral regurge- worrisome for endocarditis.  Appreciate Cardiology evaluation and await TEE.  Very concerning as he has been on appropriate abx for the last month (but apparently had + blood  cultures in May as well and was on abx at that time)  Shelle Iron, NP [facility] [phone] 03/23/2014, 10:14 AM     I have seen and examined this patient and agree with plan as outlined by B. Orvil Feil, NP  with the additions made above. Chrys Landgrebe A,MD 03/23/2014 1:18 PM

## 2014-03-24 ENCOUNTER — Inpatient Hospital Stay (HOSPITAL_COMMUNITY): Payer: Medicare Other

## 2014-03-24 ENCOUNTER — Encounter (HOSPITAL_COMMUNITY)
Admission: EM | Disposition: A | Discharge status: Home / Self Care | Payer: Self-pay | Source: Home / Self Care | Attending: Internal Medicine

## 2014-03-24 ENCOUNTER — Encounter (HOSPITAL_COMMUNITY): Payer: Self-pay | Admitting: Thoracic Surgery (Cardiothoracic Vascular Surgery)

## 2014-03-24 DIAGNOSIS — I059 Rheumatic mitral valve disease, unspecified: Secondary | ICD-10-CM

## 2014-03-24 DIAGNOSIS — D735 Infarction of spleen: Secondary | ICD-10-CM | POA: Diagnosis present

## 2014-03-24 DIAGNOSIS — I5032 Chronic diastolic (congestive) heart failure: Secondary | ICD-10-CM | POA: Diagnosis present

## 2014-03-24 DIAGNOSIS — I330 Acute and subacute infective endocarditis: Secondary | ICD-10-CM

## 2014-03-24 HISTORY — PX: TEE WITHOUT CARDIOVERSION: SHX5443

## 2014-03-24 HISTORY — DX: Infarction of spleen: D73.5

## 2014-03-24 HISTORY — DX: Acute and subacute infective endocarditis: I33.0

## 2014-03-24 LAB — RENAL FUNCTION PANEL
Albumin: 2.3 g/dL — ABNORMAL LOW (ref 3.5–5.2)
Anion gap: 19 — ABNORMAL HIGH (ref 5–15)
BUN: 48 mg/dL — ABNORMAL HIGH (ref 6–23)
CALCIUM: 10.3 mg/dL (ref 8.4–10.5)
CO2: 26 meq/L (ref 19–32)
Chloride: 94 mEq/L — ABNORMAL LOW (ref 96–112)
Creatinine, Ser: 12.46 mg/dL — ABNORMAL HIGH (ref 0.50–1.35)
GFR, EST AFRICAN AMERICAN: 5 mL/min — AB (ref >90)
GFR, EST NON AFRICAN AMERICAN: 4 mL/min — AB (ref >90)
Glucose, Bld: 141 mg/dL — ABNORMAL HIGH (ref 70–99)
PHOSPHORUS: 7.2 mg/dL — AB (ref 2.3–4.6)
Potassium: 4.7 mEq/L (ref 3.7–5.3)
Sodium: 139 mEq/L (ref 137–147)

## 2014-03-24 LAB — CBC
HCT: 24.5 % — ABNORMAL LOW (ref 39.0–52.0)
Hemoglobin: 8.2 g/dL — ABNORMAL LOW (ref 13.0–17.0)
MCH: 30.6 pg (ref 26.0–34.0)
MCHC: 33.5 g/dL (ref 30.0–36.0)
MCV: 91.4 fL (ref 78.0–100.0)
Platelets: 224 10*3/uL (ref 150–400)
RBC: 2.68 MIL/uL — ABNORMAL LOW (ref 4.22–5.81)
RDW: 15.2 % (ref 11.5–15.5)
WBC: 13.5 10*3/uL — ABNORMAL HIGH (ref 4.0–10.5)

## 2014-03-24 LAB — LEGIONELLA ANTIGEN, URINE: Legionella Antigen, Urine: NEGATIVE

## 2014-03-24 SURGERY — ECHOCARDIOGRAM, TRANSESOPHAGEAL
Anesthesia: Moderate Sedation

## 2014-03-24 MED ORDER — HYDRALAZINE HCL 25 MG PO TABS
25.0000 mg | ORAL_TABLET | Freq: Three times a day (TID) | ORAL | Status: DC
  Administered 2014-03-24: 25 mg via ORAL
  Filled 2014-03-24 (×7): qty 1

## 2014-03-24 MED ORDER — SODIUM CHLORIDE 0.9 % IV SOLN: 100.0000 mL | INTRAVENOUS | Status: DC | PRN

## 2014-03-24 MED ORDER — CHLORHEXIDINE GLUCONATE 0.12 % MT SOLN
15.0000 mL | Freq: Two times a day (BID) | OROMUCOSAL | Status: DC
  Administered 2014-03-24 – 2014-03-30 (×8): 15 mL via OROMUCOSAL
  Filled 2014-03-24 (×13): qty 15

## 2014-03-24 MED ORDER — HEPARIN SODIUM (PORCINE) 1000 UNIT/ML DIALYSIS: 9000.0000 [IU] | Freq: Once | INTRAMUSCULAR | Status: DC

## 2014-03-24 MED ORDER — ALTEPLASE 2 MG IJ SOLR
2.0000 mg | Freq: Once | INTRAMUSCULAR | Status: DC | PRN
  Filled 2014-03-24: qty 2

## 2014-03-24 MED ORDER — HYDRALAZINE HCL 20 MG/ML IJ SOLN
INTRAMUSCULAR | Status: AC
  Filled 2014-03-24: qty 1

## 2014-03-24 MED ORDER — MIDAZOLAM HCL 10 MG/2ML IJ SOLN
INTRAMUSCULAR | Status: DC | PRN
  Administered 2014-03-24: 2 mg via INTRAVENOUS
  Administered 2014-03-24: 1 mg via INTRAVENOUS

## 2014-03-24 MED ORDER — BUTAMBEN-TETRACAINE-BENZOCAINE 2-2-14 % EX AERO
INHALATION_SPRAY | CUTANEOUS | Status: DC | PRN
  Administered 2014-03-24: 2 via TOPICAL

## 2014-03-24 MED ORDER — MIDAZOLAM HCL 5 MG/ML IJ SOLN
INTRAMUSCULAR | Status: AC
  Filled 2014-03-24: qty 2

## 2014-03-24 MED ORDER — HYDRALAZINE HCL 20 MG/ML IJ SOLN
INTRAMUSCULAR | Status: DC | PRN
  Administered 2014-03-24: 10 mg via INTRAVENOUS
  Administered 2014-03-24: 20 mg via INTRAVENOUS

## 2014-03-24 MED ORDER — PENTAFLUOROPROP-TETRAFLUOROETH EX AERO: 1.0000 "application " | INHALATION_SPRAY | CUTANEOUS | Status: DC | PRN

## 2014-03-24 MED ORDER — LIDOCAINE HCL (PF) 1 % IJ SOLN: 5.0000 mL | INTRAMUSCULAR | Status: DC | PRN

## 2014-03-24 MED ORDER — FENTANYL CITRATE 0.05 MG/ML IJ SOLN
INTRAMUSCULAR | Status: AC
  Filled 2014-03-24: qty 2

## 2014-03-24 MED ORDER — HEPARIN SODIUM (PORCINE) 1000 UNIT/ML DIALYSIS: 1000.0000 [IU] | INTRAMUSCULAR | Status: DC | PRN

## 2014-03-24 MED ORDER — FENTANYL CITRATE 0.05 MG/ML IJ SOLN
INTRAMUSCULAR | Status: DC | PRN
  Administered 2014-03-24: 25 ug via INTRAVENOUS

## 2014-03-24 MED ORDER — NEPRO/CARBSTEADY PO LIQD
237.0000 mL | Freq: Two times a day (BID) | ORAL | Status: DC
  Administered 2014-03-25 – 2014-03-27 (×4): 237 mL via ORAL
  Filled 2014-03-24 (×8): qty 237

## 2014-03-24 MED ORDER — NEPRO/CARBSTEADY PO LIQD: 237.0000 mL | ORAL | Status: DC | PRN

## 2014-03-24 MED ORDER — IOHEXOL 350 MG/ML SOLN
100.0000 mL | Freq: Once | INTRAVENOUS | Status: AC | PRN
  Administered 2014-03-24: 100 mL via INTRAVENOUS

## 2014-03-24 MED ORDER — CEFAZOLIN SODIUM-DEXTROSE 2-3 GM-% IV SOLR
2.0000 g | INTRAVENOUS | Status: DC
  Administered 2014-03-24 – 2014-04-14 (×10): 2 g via INTRAVENOUS
  Filled 2014-03-24 (×12): qty 50

## 2014-03-24 MED ORDER — DIPHENHYDRAMINE HCL 50 MG/ML IJ SOLN
INTRAMUSCULAR | Status: AC
  Filled 2014-03-24: qty 1

## 2014-03-24 MED ORDER — LIDOCAINE-PRILOCAINE 2.5-2.5 % EX CREA: 1.0000 "application " | TOPICAL_CREAM | CUTANEOUS | Status: DC | PRN

## 2014-03-24 NOTE — Progress Notes (Signed)
Patient ID: Timothy Palmer, male   DOB: Nov 30, 1965, 48 y.o.   MRN: [id number]  [name] Progress Note    Subjective:   No new complaints, still c/o left knee pain   Objective:   BP 167/89  Pulse 112  Temp(Src) 98.8 F (37.1 C) (Oral)  Resp 18  Ht 5\' 11"  (1.803 m)  Wt 101.696 kg (224 lb 3.2 oz)  BMI 31.28 kg/m2  SpO2 96%  Intake/Output: I/O last 3 completed shifts: In: 840 [P.O.:840] Out: -    Intake/Output this shift:  Total I/O In: 320 [P.O.:120; I.V.:200] Out: -  Weight change: 0.272 kg (9.6 oz)  Physical Exam: Gen:WD WN AAM in NAD [id number] Resp:cta [id number] Ext:LUE AVF +T/B, left knee tender to palpation  Labs: BMET  Recent Labs Lab 03/22/14 1142 03/22/14 1922 03/23/14 0014  NA 140  --  138  K 3.6*  --  4.2  CL 94*  --  93*  CO2 33*  --  31  GLUCOSE 105*  --  106*  BUN 12  --  21  CREATININE 5.85*  --  8.15*  CALCIUM 9.8  --  10.6*  PHOS  --  5.9*  --    CBC  Recent Labs Lab 03/22/14 1142 03/23/14 0014  WBC 12.3* 11.9*  HGB 9.2* 8.4*  HCT 28.2* 25.6*  MCV 92.8 95.9  PLT 215 187    @IMGRELPRIORS @ Medications:    . amLODipine  10 mg Oral Daily  . ceFEPime (MAXIPIME) IV  2 g Intravenous Q T,Th,Sat-1800  . cinacalcet  60 mg Oral QPC supper  . darbepoetin (ARANESP) injection - DIALYSIS  40 mcg Intravenous Q Thu-HD  . heparin  5,000 Units Subcutaneous 3 times per day  . hydrALAZINE  25 mg Oral 3 times per day  . lisinopril  20 mg Oral Daily  . sevelamer carbonate  800 mg Oral TID WC  . sodium chloride  3 mL Intravenous Q12H  . sodium chloride  3 mL Intravenous Q12H  . vancomycin  1,000 mg Intravenous Q T,Th,Sa-HD    Dialysis Orders: TTS EAST  4 hr 103kgs 2K/2Ca 9000 bolus and 1500 mid Heparin 500/1.5  hectorol 9 mch Aranesp 25 mcg  Assessment/Plan:  1. Mitral Valve Endocarditis MSSA (has had + blood cultures for MSSA since May 2015)- appreciate ID, cardiology, and CTS input and awaiting cardiac cath as well as  all of the imaging required.  Will likely require MVR.  Cont with abx 2. L knee pain.- No evidence of DVT. Knee MRI pending as this will need to be done before he undergoes likely MVR and cont with Ortho eval 3. + MSSA blood cultures from 8/20- cefazolin 2gr q HD through 10/20 4. ESRD - TTS @ [place]- HD. Cont with TTS schedule 5. Hypertension/volume - 167/89. Home amlodipine and lisinopril. No volume excess. EDW looks ok.  6. Anemia - hgb 8.4. Cont ESA q Thursday. No Fe.  7. Metabolic bone disease - Ca+ 10.6. Phos 5.9 Renvela. Sensipar. - hold hectorol  8. Nutrition - alb 3.9 renal diet.    Timothy Palmer A 03/24/2014, 12:11 PM

## 2014-03-24 NOTE — Progress Notes (Signed)
   LOS: 2 days   Subjective: - breathing has been "more difficult since AM procedure" - overall fewer episodes of sweating - HTN overnight, received hydralazine - received tramadol x1 for L knee pain - TEE and dialysis today  Objective: BP 167/89  Pulse 112  Temp(Src) 98.8 F (37.1 C) (Oral)  Resp 18  Ht 5\' 11"  (1.803 m)  Wt 101.696 kg (224 lb 3.2 oz)  BMI 31.28 kg/m2  SpO2 96%  Intake/Output Summary (Last 24 hours) at 03/24/14 1316 Last data filed at 03/24/14 0906  Gross per 24 hour  Intake    440 ml  Output      0 ml  Net    440 ml    Physical Exam: GEN: no notable diaphoresis, awake and oriented, in mild distress EYES: EOMI CV: II/VI holosystolic murmur greatest at mid-clavicular line, tachycardic, S1/S2 no gallop PULM: Some fine crackles at R base but otherwise clear crackles ABD: soft, NT/ND, +BS EXT: No edema  Labs/Studies: I have reviewed labs and studies from last 24hrs per EMR.  TEE: The mitral valve showed a small vegetation (?partially calcified) on the posterior leaflet. There was severe mitral regurgitation originating from the posterior leaflet at the site of the vegetation, cannot localize perforation but cannot rule out.  Medications: I have reviewed the patient's current medications.  Assessment/Plan: Principal Problem:   Mitral valve vegetation Active Problems:   End stage renal disease   Hypertension   Acute bronchitis   T wave inversion in EKG   QT prolongation   Hypokalemia  72M h/o ESRD on dialysis with MSSA infective endocarditis of native MV.   Infective Endocarditis: TEE revealed MV endocarditis with severe MR resulting in acute pulmonary congestion. Had 8/28 blood cx + MSSA and was started on cefazolin 2g qHD scheduled to end 04/26/2014. Source likely dialysis access. Fever curve resolving, afebrile over last 24h, fewer episodes of diaphoresis. - per ID- agree with vanc/cefepime (9/15-) qHD for now, will attempt to get cx data from  nephrology; will repeat blood cx - per cards- aim for afterload reduction, added hydral 25 TID to  Lisinopril 20 and amlodipine 10 - Dr. Aundra Dubin has requested CVTS evaluations; awaiting recs for incompetent valve - Likely MSSA given historical culture data, however awaiting BCx, currently pending  Hypertension: Poorly controlled overnight. Systolics ranging 0000000. - per cards- added hydralazine 25 TID; con't amlodipine 10, linisopril 20.   Acute hypoxic respiratory failure: Secondary to acute MR with increased pulmonary congestion.  - afterload reduction as above to reduce relative degree of MR - no diuresis as pt is pulm edema is due to MR, not decreased HF  ERSD: On TTS dialysis. Reports dry wt 103 kg.  - Nephrology c/s; appreciate recs - ESA qTh; Renvela, Sensipar  Left knee pain: per ortho, unlikely to be septic process. XR negative for acute process - f/u ortho c/s; appreciate recs - pain control with fentanyl 25 mcg prn  Diet: Per RD, Provide Nepro Shake po BID  Dispo: Floor status  This is a Careers information officer Note.  The care of the patient was discussed with Dr. Eulas Post and the assessment and plan formulated with their assistance.  Please see their attached note for official documentation of the daily encounter.

## 2014-03-24 NOTE — Consult Note (Addendum)
[address]       [place] [address]             [phone]          CARDIOTHORACIC SURGERY CONSULTATION REPORT  PCP is Louis Meckel, MD Referring Provider is [facility], Elby Showers, MD   Reason for consultation:  Bacterial endocarditis  HPI:  Patient is a 48 year old [place] American male from [place] with history of end-stage renal disease for which he has been dialysis dependent for the past 11 years. The patient originally developed renal failure related to long-standing hypertension. He dialyzes 3 times a week at the [facility] dialysis center on a Tuesday Thursday Saturday schedule via left forearm primary AV fistula. Over the years the patient has done fairly well on dialysis therapy. He did undergo revision of his left radiocephalic AV fistula using bovine pericardial patch angioplasty on 07/23/2013 because of proximal radial artery occlusion.  This past May he developed transient methicillin sensitive Staphylococcus aureus sepsis that was treated with intravenous cefazolin during dialysis.  Followup blood cultures after completion of therapy reportedly were negative.  The patient developed recurrent positive blood cultures obtained during dialysis therapy on 02/24/2014 which again grew MSSA.  He was started on intravenous cefazolin therapy with dialysis which he had been receiving up until the time of this hospital admission.  In addition, the patient was seen in the emergency department 03/04/2014 for right arm pain and was found to have a DVT in the right upper arm.  Anticoagulation therapy was not recommended and no further workup was performed.  Over the past several weeks the patient has developed progressive exertional shortness of breath and dry nonproductive cough. Over last 2 weeks prior to admission he experienced intermittent resting shortness of breath and orthopnea. He also began to experience intermittent fevers and chills. On 03/21/2014  the patient developed sudden onset pain in his left knee. He states that he felt his knee pop and ever since that time he has had significant pain with difficulty bearing weight on his left leg.  He was admitted to the hospital where he has been treated for possible HCAP using intravenous vancomycin and cefepime.  He was noted to have a murmur on physical exam, and transthoracic echocardiogram demonstrated moderate to severe mitral regurgitation with possible vegetation on the mitral valve. Transesophageal echocardiogram was performed earlier today by Dr. Aundra Dubin and confirmed the presence of what appears to be a small vegetation adherent to the posterior leaflet of the mitral valve and severe (4+) mitral regurgitation. Cardiothoracic surgical consultation was requested.  The patient is divorced and lives alone locally in [place].  He has several brothers and sisters who live locally and remain supportive. The patient has been disabled because of his end-stage renal disease. Prior to his acute illness he states that he spent many days walking through [place], often for long distances. Up until recently he states that he has not had other significant physical limitations with exception to his dialysis-dependent renal failure.  He states that he had been doing fairly well until the last 3-4 weeks. He now experiences shortness of breath with mild activity and at rest. He reports mild dyspnea at present. He has had several weeks of orthopnea. He denies any history of chest pain or chest tightness either with activity or at rest. He denies any tachypalpitations, dizzy spells, or syncope. He has not had lower extremity edema.   Past Medical History  Diagnosis Date  .  Hypertension   . Peripheral vascular disease   . Pneumonia 2014  . ESRD (end stage renal disease) on dialysis     [place],Dialysis- T,Th,S  . Acute on chronic diastolic heart failure [id number]  . Severe mitral regurgitation 03/23/2014    by  TEE  . 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 Positive blood culture 11/27/2013    MSSA  . Positive blood culture 02/24/2014    MSSA  . DVT of upper extremity (deep vein thrombosis) 03/04/2014    Right arm  . Knee pain, left 03/21/2014    Past Surgical History  Procedure Laterality Date  . Av fistula placement Left ?2005    forearm   . Shuntogram Left November 04, 2011  . Patch angioplasty Left 07/23/2013    Procedure: PATCH ANGIOPLASTY- LEFT RADIOCEPHALIC ARTERIOVENOUS FISTULA;  Surgeon: Angelia Mould, MD;  Location: [facility] OR;  Service: Vascular;  Laterality: Left;    Family History  Problem Relation Age of Onset  . Diabetes Mother   . Hypertension Mother   . Hypertension Father   . Hypertension Sister   . Hypertension Brother     History   Social History  . Marital Status: Single    Spouse Name: N/A    Number of Children: N/A  . Years of Education: N/A   Occupational History  . Not on file.   Social History Main Topics  . Smoking status: Never Smoker   . Smokeless tobacco: Never Used  . Alcohol Use: Yes     Comment: 03/23/2014 "glass or 2 of gin twice/month"  . Drug Use: Yes    Special: Marijuana     Comment: last used 03/20/14  . Sexual Activity: No   Other Topics Concern  . Not on file   Social History Narrative  . No narrative on file    Prior to Admission medications   Medication Sig Start Date End Date Taking? Authorizing Provider  cinacalcet (SENSIPAR) 60 MG tablet Take 60 mg by mouth daily.   Yes Historical Provider, MD  lisinopril (PRINIVIL,ZESTRIL) 20 MG tablet Take 20 mg by mouth daily.   Yes Historical Provider, MD  oxyCODONE-acetaminophen (PERCOCET/ROXICET) 5-325 MG per tablet Take 1-2 tablets by mouth every 4 (four) hours as needed for severe pain. 03/04/14  Yes Virgel Manifold, MD  sevelamer carbonate (RENVELA) 800 MG tablet Take 800 mg by mouth 3 (three) times daily with meals.   Yes Historical Provider, MD     Current Facility-Administered Medications  Medication Dose Route Frequency Provider Last Rate Last Dose  . 0.9 %  sodium chloride infusion  250 mL Intravenous PRN Otho Bellows, MD      . 0.9 %  sodium chloride infusion  100 mL Intravenous PRN Marlena Clipper, NP      . 0.9 %  sodium chloride infusion  100 mL Intravenous PRN Marlena Clipper, NP      . 0.9 %  sodium chloride infusion   Intravenous Continuous Larey Dresser, MD 20 mL/hr at 03/24/14 [phone]    . alteplase (CATHFLO ACTIVASE) injection 2 mg  2 mg Intracatheter Once PRN Marlena Clipper, NP      . amLODipine (NORVASC) tablet 10 mg  10 mg Oral Daily Jessee Avers, MD   10 mg at 03/23/14 1459  . butamben-tetracaine-benzocaine (CETACAINE) spray    PRN Larey Dresser, MD   2 spray at 03/24/14 [phone]  . ceFAZolin (ANCEF) IVPB 2 g/50  mL premix  2 g Intravenous Q T,Th,Sat-1800 Campbell Riches, MD      . chlorhexidine (PERIDEX) 0.12 % solution 15 mL  15 mL Mouth/Throat BID Rexene Alberts, MD      . cinacalcet [facility]) tablet 60 mg  60 mg Oral QPC supper Marlena Clipper, NP   60 mg at 03/23/14 1721  . darbepoetin (ARANESP) injection 40 mcg  40 mcg Intravenous Q Thu-HD Marlena Clipper, NP      . feeding supplement (NEPRO CARB STEADY) liquid 237 mL  237 mL Oral PRN Marlena Clipper, NP      . feeding supplement (NEPRO CARB STEADY) liquid 237 mL  237 mL Oral BID BM Rogue Bussing, RD      . fentaNYL (SUBLIMAZE) injection    PRN Larey Dresser, MD   25 mcg at 03/24/14 0815  . heparin injection 1,000 Units  1,000 Units Dialysis PRN Marlena Clipper, NP      . heparin injection 5,000 Units  5,000 Units Subcutaneous 3 times per day Otho Bellows, MD   5,000 Units at 03/23/14 2304  . heparin injection 9,000 Units  9,000 Units Dialysis Once in dialysis Marlena Clipper, NP      . hydrALAZINE (APRESOLINE) injection    PRN Larey Dresser, MD   10 mg at 03/24/14 0840  . hydrALAZINE (APRESOLINE) tablet 25  mg  25 mg Oral 3 times per day Larey Dresser, MD      . lidocaine (PF) (XYLOCAINE) 1 % injection 5 mL  5 mL Intradermal PRN Marlena Clipper, NP      . lidocaine-prilocaine (EMLA) cream 1 application  1 application Topical PRN Marlena Clipper, NP      . lisinopril (PRINIVIL,ZESTRIL) tablet 20 mg  20 mg Oral Daily Otho Bellows, MD   20 mg at 03/23/14 1013  . midazolam (VERSED) injection    PRN Larey Dresser, MD   1 mg at 03/24/14 0817  . pentafluoroprop-tetrafluoroeth (GEBAUERS) aerosol 1 application  1 application Topical PRN Marlena Clipper, NP      . sevelamer carbonate (RENVELA) tablet 800 mg  800 mg Oral TID WC Otho Bellows, MD   800 mg at 03/24/14 1151  . sodium chloride 0.9 % injection 3 mL  3 mL Intravenous Q12H Otho Bellows, MD   3 mL at 03/23/14 2305  . sodium chloride 0.9 % injection 3 mL  3 mL Intravenous Q12H Otho Bellows, MD   3 mL at 03/24/14 1007  . sodium chloride 0.9 % injection 3 mL  3 mL Intravenous PRN Otho Bellows, MD        No Known Allergies    Review of Systems:   General:  decreased appetite, decreased energy, no weight gain, no weight loss, + fever  Cardiac:  no chest pain with exertion, no chest pain at rest, + SOB with mild exertion, + intermittent resting SOB, no PND, + orthopnea, no palpitations, no arrhythmia, no atrial fibrillation, no LE edema, no dizzy spells, no syncope  Respiratory:  + shortness of breath, no home oxygen, + productive cough, + dry cough, no bronchitis, no wheezing, no hemoptysis, no asthma, no pain with inspiration or cough, no sleep apnea, no CPAP at night  GI:   no difficulty swallowing, no reflux, no frequent heartburn, no hiatal hernia, no abdominal pain, no constipation, no diarrhea, no hematochezia, no hematemesis, no melena  GU:  no dysuria,  + makes small amount of urine 2-3 x/day, no frequency, no urinary tract infection, no hematuria, no enlarged prostate, no kidney stones, + end-stage kidney  disease  Vascular:  no pain suggestive of claudication, no pain in feet, no leg cramps, no varicose veins, + recent DVT, no non-healing foot ulcer  Neuro:   no stroke, no TIA's, no seizures, no headaches, no temporary blindness one eye,  no slurred speech, no peripheral neuropathy, no chronic pain, no instability of gait, no memory/cognitive dysfunction  Musculoskeletal: no arthritis, + joint swelling left knee, no myalgias, + difficulty walking due to acute left knee pain, previously normal mobility   Skin:   no rash, no itching, no recent skin infections, no pressure sores or ulcerations  Psych:   no anxiety, no depression, no nervousness, no unusual recent stress  Eyes:   + intermittent blurry vision, no floaters, + recent vision changes, does not wear glasses or contacts  ENT:   no hearing loss, no loose or painful teeth, no dentures, last saw dentist many, many years ago  Hematologic:  no easy bruising, no abnormal bleeding, no clotting disorder, no frequent epistaxis  Endocrine:  no diabetes, does not check CBG's at home     Physical Exam:   BP 153/84  Pulse 101  Temp(Src) 98.9 F (37.2 C) (Oral)  Resp 33  Ht 5\' 11"  (1.803 m)  Wt 101.9 kg (224 lb 10.4 oz)  BMI 31.35 kg/m2  SpO2 100%  General:  Mildly obese,  well-appearing  HEENT:  Unremarkable   Neck:   no JVD, no bruits, no adenopathy   Chest:   clear to auscultation, symmetrical breath sounds, no wheezes, no rhonchi   CV:   RRR, grade III/VI holosystolic murmur   Abdomen:  soft, non-tender, no masses   Extremities:  warm, well-perfused, pulses diminished, no lower extremity edema  Rectal/GU  Deferred  Neuro:   Grossly non-focal and symmetrical throughout  Skin:   Clean and dry, no rashes, no breakdown  Diagnostic Tests:  Transthoracic Echocardiography  Patient: Samrath, Pelt MR #: [id number] Study Date: 03/23/2014 Gender: M Age: 63 Height: 180.3 cm Weight: 101.2 kg BSA: 2.28 m^2 Pt. Status: Room:  6E18C  SONOGRAPHER Mauricio Po, RDCS, CCT PERFORMING [facility], Inpatient ADMITTING Aldine Contes [id number] ATTENDING [address] [place] [id number], [place] [id number] Shara Blazing, [place] [phone]  cc:  ------------------------------------------------------------------- LV EF: 60% - 65%  ------------------------------------------------------------------- Indications: Dyspnea 786.09.  ------------------------------------------------------------------- History: Risk factors: End stage renal disease. Hypertension.  ------------------------------------------------------------------- Study Conclusions  - Left ventricle: The cavity size was normal. Wall thickness was increased in a pattern of mild LVH. Systolic function was normal. The estimated ejection fraction was in the range of 60% to 65%. Indeterminant diastolic function. Wall motion was normal; there were no regional wall motion abnormalities. - Ventricular septum: D-shaped septum concerning for RV pressure/volume overload. - Aortic valve: There was no stenosis. - Mitral valve: There appears to be a small mobile vegetation on the posterior leaflet of the mitral valve. Mildly to moderately calcified annulus. There was moderate to severe regurgitation. - Left atrium: The atrium was mildly dilated. - Right ventricle: The cavity size was mildly dilated. Systolic function was normal. - Right atrium: The atrium was mildly dilated. - Tricuspid valve: Peak RV-RA gradient (S): 48 mm Hg. - Pulmonary arteries: PA peak pressure: 63 mm Hg (S). - Systemic veins: IVC measured 2.2 cm with < 50% respirophasic variation, suggesting RA pressure 15 mmHg.  Impressions:  -  Normal LV size with mild LV hypertrophy. EF 60-65%. Mildly dilated RV with normal systolic function. D-shaped interventricular septum suggestive of RV pressure/volume overload. Moderate pulmonary hypertension. There was a small mobile vegetation on  the posterior leaflet of the mitral valve. There was significant MR, ?moderate-severe. Cannot rule out perforation based on this finding. Patient needs a TEE.  Transthoracic echocardiography. M-mode, complete 2D, spectral Doppler, and color Doppler. Birthdate: Patient birthdate: 17-May-1966. Age: Patient is 48 yr old. Sex: Gender: male. BMI: 31.1 kg/m^2. Blood pressure: 166/89 Patient status: Inpatient. Study date: Study date: 03/23/2014. Study time: 09:27 AM. Location: Bedside.  -------------------------------------------------------------------  ------------------------------------------------------------------- Left ventricle: The cavity size was normal. Wall thickness was increased in a pattern of mild LVH. Systolic function was normal. The estimated ejection fraction was in the range of 60% to 65%. Indeterminant diastolic function. Wall motion was normal; there were no regional wall motion abnormalities.  ------------------------------------------------------------------- Aortic valve: Trileaflet; mildly calcified leaflets. Doppler: There was no stenosis. There was no regurgitation.  ------------------------------------------------------------------- Aorta: Aortic root: The aortic root was normal in size. Ascending aorta: The ascending aorta was normal in size.  ------------------------------------------------------------------- Mitral valve: There appears to be a small mobile vegetation on the posterior leaflet of the mitral valve. Mildly to moderately calcified annulus. Doppler: There was no evidence for stenosis. There was moderate to severe regurgitation. Peak gradient (D): 8 mm Hg.  ------------------------------------------------------------------- Left atrium: The atrium was mildly dilated.  ------------------------------------------------------------------- Right ventricle: The cavity size was mildly dilated. Systolic function was  normal.  ------------------------------------------------------------------- Ventricular septum: D-shaped septum concerning for RV pressure/volume overload.  ------------------------------------------------------------------- Pulmonic valve: Structurally normal valve. Cusp separation was normal. Doppler: Transvalvular velocity was within the normal range. There was no regurgitation.  ------------------------------------------------------------------- Tricuspid valve: Doppler: There was trivial regurgitation.  ------------------------------------------------------------------- Right atrium: The atrium was mildly dilated.  ------------------------------------------------------------------- Pericardium: There was no pericardial effusion.  ------------------------------------------------------------------- Systemic veins: IVC measured 2.2 cm with < 50% respirophasic variation, suggesting RA pressure 15 mmHg.  ------------------------------------------------------------------- Measurements  Left ventricle Value Reference LV ID, ED, PLAX chordal 51.8 mm 43 - 52 LV ID, ES, PLAX chordal 37.6 mm 23 - 38 LV fx shortening, PLAX chordal (L) 27 % >=29 LV PW thickness, ED 14.1 mm --------- IVS/LV PW ratio, ED 0.79 <=1.3 LV e&', lateral 11.3 cm/s --------- LV E/e&', lateral 12.48 --------- LV e&', medial 10.6 cm/s --------- LV E/e&', medial 13.3 --------- LV e&', average 10.95 cm/s --------- LV E/e&', average 12.88 ---------  Ventricular septum Value Reference IVS thickness, ED 11.1 mm ---------  Aorta Value Reference Aortic root ID, ED 27 mm ---------  Left atrium Value Reference LA ID, A-P, ES 51 mm --------- LA ID/bsa, A-P (H) 2.24 cm/m^2 <=2.2 LA volume, S 69 ml --------- LA volume/bsa, S 30.3 ml/m^2 ---------  Mitral valve Value Reference Mitral E-wave peak velocity 141 cm/s --------- Mitral A-wave peak velocity 95.6 cm/s --------- Mitral deceleration time 169 ms 150 -  230 Mitral peak gradient, D 8 mm Hg --------- Mitral E/A ratio, peak 1.5 ---------  Pulmonary arteries Value Reference PA pressure, S, DP (H) 63 mm Hg <=30  Tricuspid valve Value Reference Tricuspid regurg peak velocity 346 cm/s --------- Tricuspid peak RV-RA gradient 48 mm Hg --------- Tricuspid maximal regurg velocity, 346 cm/s --------- PISA  Right ventricle Value Reference RV s&', lateral, S 17 cm/s ---------  Legend: (L) and (H) mark values outside specified reference range.  ------------------------------------------------------------------- Prepared and Electronically Authenticated by  Loralie Champagne, M.D. 2015-09-16T10:28:22    Procedure: TEE  Sedation:  Versed 3 mg IV, Fentanyl 25 mcg IV  Hydralazine 30 mg IV total given because of hypertension  Indication: Assess endocarditis and mitral regurgitation.  Findings: Please see echo section for full report. Difficult images. Normal LV size and systolic function, EF [id number]. Normal RV size and systolic function. Trileaflet aortic valve with no regurgitation or stenosis, no endocarditis. Tricuspid valve with trivial TR, no endocarditis. Negative bubble study. The mitral valve showed a small vegetation (?partially calcified) on the posterior leaflet. There was severe mitral regurgitation originating from the posterior leaflet at the site of the vegetation, cannot localize perforation but cannot rule out. Pulmonary vein doppler pattern showed systolic flattening but not reversal.  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  Need to control blood pressure, will add hydralazine. I will also have him evaluated by CVTS.  Loralie Champagne  03/24/2014  8:59 AM        Impression:  Patient has bacterial endocarditis with a small vegetation adherent to the posterior leaflet of the mitral valve. Blood cultures obtained several weeks ago grew  methicillin sensitive Staphylococcus aureus, and the patient has history of blood cultures positive for the same organism in May of this year. Repeat blood cultures obtained during this hospital admission on antibiotic therapy have remained no growth so far. The patient's endocarditis is complicated by the presence of severe (4+) mitral regurgitation and acute combined systolic and diastolic congestive heart failure, New York Heart Association functional class IV.  Symptoms have improved somewhat since hospitalization.  Finally, the patient also presented with acute onset left knee pain which has persisted. Although plain films of the left knee are unrevealing, the possibility of septic arthritis remains. Finally, the patient has had some transient visual disturbances while he has been in the hospital, although the symptoms he describes are not unilateral or focal.     Plan:  I discussed matters at length with the patient this afternoon. Ultimately I feel the patient will likely need mitral valve repair or replacement. I agree with plans to continue to treat him with intravenous antibiotics for bacterial endocarditis per the Infectious Disease team's recommendations.  He needs MRI of the brain to rule out septic embolus and MRI of the left knee to rule out septic arthritis. He also needs CT angiogram of the abdomen and pelvis to rule out septic embolization to the spleen and liver.  Will ask Radiology to proceed with lower extremity run-off to r/o signs of embolization to the left popliteal artery, although the patient does not have signs of lower extremity ischemia.  The patient will need left and right heart catheterization prior to surgery. Finally, the patient needs a dental service consultation.  All of his questions been addressed.   I spent in excess of 120 minutes during the conduct of this hospital consultation and >50% of this time involved direct face-to-face encounter for counseling and/or  coordination of the patient's care.   Valentina Gu. Roxy Manns, MD 03/24/2014 4:28 PM

## 2014-03-24 NOTE — Progress Notes (Signed)
  I have seen and examined the patient, and reviewed the daily progress note by Dahlia Bailiff, MS IV and discussed the care of the patient with him. Please see my progress note from 03/23/2014 for further details regarding assessment and plan.    Signed:  Otho Bellows, MD 03/24/2014, 7:09 AM

## 2014-03-24 NOTE — Progress Notes (Signed)
BCx May 2015 MSSA BCx July 2015 (-) BCx August 2015 MSSA

## 2014-03-24 NOTE — Progress Notes (Signed)
INITIAL NUTRITION ASSESSMENT  DOCUMENTATION CODES Per approved criteria  -Obesity Unspecified   INTERVENTION: Provide Nepro Shake po BID, each supplement provides 425 kcal and 19 grams protein.  NUTRITION DIAGNOSIS: Increased nutrient needs related to chronic illness, ESRD as evidenced by estimated nutrition needs.   Goal: Pt to meet >/= 90% of their estimated nutrition needs   Monitor:  PO intake, weight trends, labs, I/O's  Reason for Assessment: MST  48 y.o. male  Admitting Dx: Mitral valve vegetation  ASSESS MENT: with PMH of HTN, ESRD on HD p/w worsening cough, SOB 1 day. Pt states that approx 3 weeks ago he developed a cough productive of yellowish sputum assoc with SOB. showed a small vegetation on the posterior leaflet possible moderate to severe MR (not well-visualized), leading to concern for endocarditis with valve perforation.  Pt reports his appetite has been improving. Pt reports for the past week pt has had a decreased appetite due to sickness, but was still eating 2 full meals a day.  Pt reports now he is feeling better. Meal completion is 80-100% since admission. Pt reports his usual body weight of 239 lbs where he reports last weighing before his sickness a week ago. Noted pt with a 6% weight loss in one week. Will continue to monitor. Pt is willing to try Nepro for extra calories and protein. One Nepro was brought to bedside as pt requested to try it during time of visit.   Pt with observed no significant fat or muscle mass loss.  Labs: Low chloride and GFR. High creatinine and calcium.  Height: Ht Readings from Last 1 Encounters:  03/22/14 5\' 11"  (1.803 m)    Weight: Wt Readings from Last 1 Encounters:  03/24/14 224 lb 3.2 oz (101.696 kg)    Ideal Body Weight: 172 lbs  % Ideal Body Weight: 130%  Wt Readings from Last 10 Encounters:  03/24/14 224 lb 3.2 oz (101.696 kg)  03/24/14 224 lb 3.2 oz (101.696 kg)  09/01/13 246 lb (111.585 kg)  07/23/13  238 lb 1.6 oz (108 kg)  07/23/13 238 lb 1.6 oz (108 kg)  06/30/13 241 lb (109.317 kg)  09/12/12 248 lb (112.492 kg)    Usual Body Weight: 239 lbs   % Usual Body Weight: 94%  BMI:  Body mass index is 31.28 kg/(m^2). Class I obesity  Adjusted body weight: 96.45 kg  Estimated Nutritional Needs: Kcal: 2150-2350 Protein: 105-115 grams Fluid: 1.2 L/day  Skin: generalized edema  Diet Order: Renal with 1200 ml  EDUCATION NEEDS: -No education needs identified at this time   Intake/Output Summary (Last 24 hours) at 03/24/14 1044 Last data filed at 03/24/14 0906  Gross per 24 hour  Intake    680 ml  Output      0 ml  Net    680 ml    Last BM: 9/15  Labs:   Recent Labs Lab 03/22/14 1142 03/22/14 1349 03/22/14 1922 03/23/14 0014  NA 140  --   --  138  K 3.6*  --   --  4.2  CL 94*  --   --  93*  CO2 33*  --   --  31  BUN 12  --   --  21  CREATININE 5.85*  --   --  8.15*  CALCIUM 9.8  --   --  10.6*  MG  --  1.8  --   --   PHOS  --   --  5.9*  --  GLUCOSE 105*  --   --  106*    CBG (last 3)  No results found for this basename: GLUCAP,  in the last 72 hours  Scheduled Meds: . amLODipine  10 mg Oral Daily  . ceFEPime (MAXIPIME) IV  2 g Intravenous Q T,Th,Sat-1800  . cinacalcet  60 mg Oral QPC supper  . darbepoetin (ARANESP) injection - DIALYSIS  40 mcg Intravenous Q Thu-HD  . heparin  5,000 Units Subcutaneous 3 times per day  . hydrALAZINE  25 mg Oral 3 times per day  . lisinopril  20 mg Oral Daily  . sevelamer carbonate  800 mg Oral TID WC  . sodium chloride  3 mL Intravenous Q12H  . sodium chloride  3 mL Intravenous Q12H  . vancomycin  1,000 mg Intravenous Q T,Th,Sa-HD    Continuous Infusions: . sodium chloride 20 mL/hr at 03/24/14 [id number]    Past Medical History  Diagnosis Date  . Hypertension   . Peripheral vascular disease   . Pneumonia 2014  . ESRD (end stage renal disease) on dialysis     [place],Dialysis- T,Th,S    Past Surgical History   Procedure Laterality Date  . Av fistula placement Left ?2005    forearm   . Shuntogram Left November 04, 2011  . Patch angioplasty Left 07/23/2013    Procedure: PATCH ANGIOPLASTY- LEFT RADIOCEPHALIC ARTERIOVENOUS FISTULA;  Surgeon: Angelia Mould, MD;  Location: [facility] OR;  Service: Vascular;  Laterality: Left;    Kallie Locks, MS, Provisional LDN Pager # [phone] After hours/ weekend pager # [phone]

## 2014-03-24 NOTE — Progress Notes (Signed)
Echocardiogram Echocardiogram Transesophageal has been performed.  Zhi, Chapla 03/24/2014, 8:08 AM

## 2014-03-24 NOTE — Progress Notes (Signed)
INFECTIOUS DISEASE PROGRESS NOTE  ID: Timothy Palmer is a 48 y.o. male with  Principal Problem:   Mitral valve vegetation Active Problems:   End stage renal disease   Hypertension   Acute bronchitis   T wave inversion in EKG   QT prolongation   Hypokalemia  Subjective: Breathing better  Abtx:  Anti-infectives   Start     Dose/Rate Route Frequency Ordered Stop   03/24/14 1800  ceFEPIme (MAXIPIME) 2 g in dextrose 5 % 50 mL IVPB     2 g 100 mL/hr over 30 Minutes Intravenous Every T-Th-Sa (1800) 03/23/14 1108     03/24/14 1200  ceFEPIme (MAXIPIME) 2 g in dextrose 5 % 50 mL IVPB  Status:  Discontinued     2 g 100 mL/hr over 30 Minutes Intravenous Every T-Th-Sa (Hemodialysis) 03/22/14 1355 03/23/14 1108   03/24/14 1200  vancomycin (VANCOCIN) IVPB 1000 mg/200 mL premix     1,000 mg 200 mL/hr over 60 Minutes Intravenous Every T-Th-Sa (Hemodialysis) 03/22/14 2232     03/22/14 1400  vancomycin (VANCOCIN) 1,750 mg in sodium chloride 0.9 % 500 mL IVPB     1,750 mg 250 mL/hr over 120 Minutes Intravenous  Once 03/22/14 1346 03/22/14 1720   03/22/14 1400  ceFEPIme (MAXIPIME) 2 g in dextrose 5 % 50 mL IVPB     2 g 100 mL/hr over 30 Minutes Intravenous  Once 03/22/14 1347 03/22/14 1521   03/22/14 1345  vancomycin (VANCOCIN) 15 mg/kg in sodium chloride 0.9 % 100 mL IVPB  Status:  Discontinued     15 mg/kg 100 mL/hr over 60 Minutes Intravenous  Once 03/22/14 1342 03/22/14 1345      Medications:  Scheduled: . amLODipine  10 mg Oral Daily  . ceFEPime (MAXIPIME) IV  2 g Intravenous Q T,Th,Sat-1800  . cinacalcet  60 mg Oral QPC supper  . darbepoetin (ARANESP) injection - DIALYSIS  40 mcg Intravenous Q Thu-HD  . feeding supplement (NEPRO CARB STEADY)  237 mL Oral BID BM  . heparin  5,000 Units Subcutaneous 3 times per day  . heparin  9,000 Units Dialysis Once in dialysis  . hydrALAZINE  25 mg Oral 3 times per day  . lisinopril  20 mg Oral Daily  . sevelamer carbonate  800 mg Oral TID  WC  . sodium chloride  3 mL Intravenous Q12H  . sodium chloride  3 mL Intravenous Q12H  . vancomycin  1,000 mg Intravenous Q T,Th,Sa-HD    Objective: Vital signs in last 24 hours: Temp:  [98.6 F (37 C)-100 F (37.8 C)] 98.9 F (37.2 C) (09/17 1326) Pulse Rate:  [40-114] 100 (09/17 1430) Resp:  [18-55] 36 (09/17 1430) BP: (145-215)/(82-130) 165/89 mmHg (09/17 1430) SpO2:  [90 %-100 %] 97 % (09/17 1341) Weight:  [101.696 kg (224 lb 3.2 oz)-101.9 kg (224 lb 10.4 oz)] 101.9 kg (224 lb 10.4 oz) (09/17 1326)   General appearance: alert, cooperative and no distress Resp: clear to auscultation bilaterally Cardio: regular rate and rhythm GI: normal findings: bowel sounds normal and soft, non-tender  Lab Results  Recent Labs  03/23/14 0014 03/24/14 1300  WBC 11.9* 13.5*  HGB 8.4* 8.2*  HCT 25.6* 24.5*  NA 138 139  K 4.2 4.7  CL 93* 94*  CO2 31 26  BUN 21 48*  CREATININE 8.15* 12.46*   Liver Panel  Recent Labs  03/24/14 1300  ALBUMIN 2.3*   Sedimentation Rate No results found for this basename: ESRSEDRATE,  in  the last 72 hours C-Reactive Protein No results found for this basename: CRP,  in the last 72 hours  Microbiology: Recent Results (from the past 240 hour(s))  CULTURE, BLOOD (ROUTINE X 2)     Status: None   Collection Time    03/22/14  1:52 PM      Result Value Ref Range Status   Specimen Description BLOOD ARM RIGHT   Final   Special Requests BOTTLES DRAWN AEROBIC ONLY 10CC   Final   Culture  Setup Time     Final   Value: 03/22/2014 18:55     Performed at [facility]   Culture     Final   Value:        BLOOD CULTURE RECEIVED NO GROWTH TO DATE CULTURE WILL BE HELD FOR 5 DAYS BEFORE ISSUING A FINAL NEGATIVE REPORT     Performed at [facility]   Report Status PENDING   Incomplete  CULTURE, BLOOD (ROUTINE X 2)     Status: None   Collection Time    03/22/14  2:00 PM      Result Value Ref Range Status   Specimen Description BLOOD HAND  RIGHT   Final   Special Requests BOTTLES DRAWN AEROBIC AND ANAEROBIC 10CC   Final   Culture  Setup Time     Final   Value: 03/22/2014 18:56     Performed at [facility]   Culture     Final   Value:        BLOOD CULTURE RECEIVED NO GROWTH TO DATE CULTURE WILL BE HELD FOR 5 DAYS BEFORE ISSUING A FINAL NEGATIVE REPORT     Performed at [facility]   Report Status PENDING   Incomplete  CULTURE, BLOOD (ROUTINE X 2)     Status: None   Collection Time    03/23/14  7:20 PM      Result Value Ref Range Status   Specimen Description BLOOD RIGHT HAND   Final   Special Requests BOTTLES DRAWN AEROBIC AND ANAEROBIC 5CC   Final   Culture  Setup Time     Final   Value: 03/23/2014 22:25     Performed at [facility]   Culture     Final   Value:        BLOOD CULTURE RECEIVED NO GROWTH TO DATE CULTURE WILL BE HELD FOR 5 DAYS BEFORE ISSUING A FINAL NEGATIVE REPORT     Performed at [facility]   Report Status PENDING   Incomplete    Studies/Results: Dg Chest 2 View  03/23/2014   CLINICAL DATA:  Weakness, leg pain  EXAM: CHEST  2 VIEW  COMPARISON:  03/22/2014  FINDINGS: Borderline cardiomegaly. Small right pleural effusion with right basilar atelectasis. Central mild vascular congestion and mild perihilar interstitial prominence suspicious for mild interstitial edema. Bony thorax is unremarkable.  IMPRESSION: Small right pleural effusion with right basilar atelectasis. Central mild vascular congestion and mild perihilar interstitial prominence suspicious for mild interstitial edema. No segmental infiltrate.   Electronically Signed   By: Lahoma Crocker M.D.   On: 03/23/2014 09:04   Dg Knee Ap/lat W/sunrise Left  03/23/2014   CLINICAL DATA:  Left knee pain and swelling for 3 days  EXAM: DG KNEE - 3 VIEWS  COMPARISON:  None.  FINDINGS: There is no evidence of fracture, dislocation, or joint effusion. There is no evidence of arthropathy or other focal bone abnormality. Soft tissues  are unremarkable. Transverse patellar  cleft incidentally noted. Vascular calcifications are noted.  IMPRESSION: Negative.   Electronically Signed   By: Conchita Paris M.D.   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

## 2014-03-24 NOTE — Progress Notes (Signed)
In HD. No SOB.   TEE this AM.  Severe MR  Dr. Aundra Dubin has contacted TCTS.   ABX per ID.   Candee Furbish, MD

## 2014-03-24 NOTE — Progress Notes (Signed)
  Date: 03/24/2014  Patient name: Timothy Palmer  Medical record number: [id number]  Date of birth: [birth year]   This patient has been seen and the plan of care was discussed with the house staff. Please see their note for complete details. I concur with their findings with the following additions/corrections: Mr Moorman states breathing is different since TEE. He has tachypnea, tachycardia, and hypoxia on RA (low 90's on 3 L [place]). His lungs are clear, H tachy with systolic murmur.   1. Bacteremia - Aug 2015 blood cx with HD MSSA.On cefazolin with HD since. Cx obtained here (two separate days) are so far negative. On Vanc and cefepime. Fever decreasing - max in 24 hr 100. CBC pending.   2. 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   3. Acute CHF 2/2 mitral regurg / diastolic dysfxn - HD today. BP can tolerate.  4. HTN - Norvasc 10, hydral 25 q8, linisopril 20.     Bartholomew Crews, MD 03/24/2014, 11:35 AM

## 2014-03-24 NOTE — H&P (View-Only) (Signed)
CARDIOLOGY CONSULT NOTE  Patient ID: Timothy Palmer MRN: [id number] DOB/AGE: 12-12-1965 49 y.o.  Admit date: 03/22/2014 Reason for Consultation: MR, endocarditis  HPI: 48 yo with history of ESRD x 10 years (hypertensive nephropathy) was admitted with fever/chills and dyspnea.  He was in his usual state of health until Monday.  On Monday, he noted dyspnea mowing his grass.  This progressed to where he could not walk 1 block without having to stop.  He noted orthopnea.  Yesterday he had fevers/chills and felt short of breath at dialysis.  He came to the ER.  CTA chest with no PE but there was evidence for pulmonary edema.  He was febrile to 101.  Blood cultures were drawn and he was started on antibiotics for ?PNA.  Given dyspnea, he had an echo done today.  This showed a small vegetation on the posterior leaflet possible moderate to severe MR (not well-visualized), leading to concern for endocarditis with valve perforation.  Patient is currently hypertensive and afebrile.   Review of systems complete and found to be negative unless listed above in HPI  Past Medical History: 1. ESRD: Hypertensive nephropathy, TTS HD. 2. HTN  Scheduled Meds: . amLODipine  5 mg Oral Daily  . [START ON 03/24/2014] ceFEPime (MAXIPIME) IV  2 g Intravenous Q T,Th,Sat-1800  . cinacalcet  60 mg Oral QPC supper  . [START ON 03/24/2014] darbepoetin (ARANESP) injection - DIALYSIS  40 mcg Intravenous Q Thu-HD  . heparin  5,000 Units Subcutaneous 3 times per day  . lisinopril  20 mg Oral Daily  . sevelamer carbonate  800 mg Oral TID WC  . sodium chloride  3 mL Intravenous Q12H  . sodium chloride  3 mL Intravenous Q12H  . [START ON 03/24/2014] vancomycin  1,000 mg Intravenous Q T,Th,Sa-HD   Continuous Infusions:  PRN Meds:.sodium chloride, sodium chloride   Family History  Problem Relation Age of Onset  . Diabetes Mother   . Hypertension Mother   . Hypertension Father   . Hypertension Sister   . Hypertension  Brother     History   Social History  . Marital Status: Single    Spouse Name: N/A    Number of Children: N/A  . Years of Education: N/A   Occupational History  . Not on file.   Social History Main Topics  . Smoking status: Never Smoker   . Smokeless tobacco: Never Used  . Alcohol Use: Yes  . Drug Use: Yes    Special: Marijuana     Comment: last used 03/20/14  . Sexual Activity: Not on file   Other Topics Concern  . Not on file   Social History Narrative  . No narrative on file     Prescriptions prior to admission  Medication Sig Dispense Refill  . cinacalcet (SENSIPAR) 60 MG tablet Take 60 mg by mouth daily.      Marland Kitchen lisinopril (PRINIVIL,ZESTRIL) 20 MG tablet Take 20 mg by mouth daily.      Marland Kitchen oxyCODONE-acetaminophen (PERCOCET/ROXICET) 5-325 MG per tablet Take 1-2 tablets by mouth every 4 (four) hours as needed for severe pain.  20 tablet  0  . sevelamer carbonate (RENVELA) 800 MG tablet Take 800 mg by mouth 3 (three) times daily with meals.        Physical exam Blood pressure 164/107, pulse 102, temperature 99.3 F (37.4 C), temperature source Oral, resp. rate 23, height 5\' 11"  (1.803 m), weight 223 lb 9.6 oz (101.424 kg), SpO2 92.00%.  General: NAD Neck: JVP 10 cm, no thyromegaly or thyroid nodule.  Lungs: Clear to auscultation bilaterally with normal respiratory effort. CV: Nondisplaced PMI.  Heart regular S1/S2, no S3/S4, 3/6 HSM apex.  No peripheral edema.  No carotid bruit.  Normal pedal pulses.  Abdomen: Soft, nontender, no hepatosplenomegaly, no distention.  Skin: Intact without lesions or rashes.  Neurologic: Alert and oriented x 3.  Psych: Normal affect. Extremities: No clubbing or cyanosis.  HEENT: Normal.   Labs:   Lab Results  Component Value Date   WBC 11.9* 03/23/2014   HGB 8.4* 03/23/2014   HCT 25.6* 03/23/2014   MCV 95.9 03/23/2014   PLT 187 03/23/2014    Recent Labs Lab 03/23/14 0014  NA 138  K 4.2  CL 93*  CO2 31  BUN 21  CREATININE 8.15*   CALCIUM 10.6*  GLUCOSE 106*   Lab Results  Component Value Date   TROPONINI <0.30 03/23/2014    Radiology: - CTA chest: No PE, interstitial thickening suggestive of CHF.   EKG: NSR, inferior T wave flattening  ASSESSMENT AND PLAN: 48 yo with history of ESRD x 10 years (hypertensive nephropathy) was admitted with fever/chills and dyspnea.  1. 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  Source most likely dialysis access.  Blood cultures are so far negative. He is on broad spectrum antibiotics.   - He will need ID consult.  - I will arrange TEE to get a better look at the valve.  He has a loud MR murmur on exam and appears to have at least moderate to severe MR on echo.  I am concerned for possible perforation.  It is possible that he could need surgical intervention given the presence of CHF (see below).  2. CHF: Acute diastolic CHF likely due to mitral regurgitation.  He has a loud MR murmur.  He is volume overloaded on exam.  Increased dyspnea recently likely related to onset of MR.   - HD for volume removal.  - Would control afterload (lower blood pressure).  Agree with adding amlodipine.   Signed: Loralie Champagne 03/23/2014 1:05 PM

## 2014-03-24 NOTE — Progress Notes (Signed)
03/24/2014 10:17 AM  Pt oxygen saturations dipped to 84% on RA. Pt had taken oxygen off himself when he got back from TEE. I replaced oxygen and titrated it up to three liters via nasal cannula.  Pt tolerated well.  Pt saturating at 88-90% on 3L.  Dr. Leeroy Bock notified. Princella Pellegrini

## 2014-03-24 NOTE — CV Procedure (Signed)
Procedure: TEE  Sedation: Versed 3 mg IV, Fentanyl 25 mcg IV  Hydralazine 30 mg IV total given because of hypertension  Indication: Assess endocarditis and mitral regurgitation.  Findings: Please see echo section for full report.  Difficult images.  Normal LV size and systolic function, EF [id number].  Normal RV size and systolic function.  Trileaflet aortic valve with no regurgitation or stenosis, no endocarditis.  Tricuspid valve with trivial TR, no endocarditis.  Negative bubble study.  The mitral valve showed a small vegetation (?partially calcified) on the posterior leaflet.  There was severe mitral regurgitation originating from the posterior leaflet at the site of the vegetation, cannot localize perforation but cannot rule out.  Pulmonary vein doppler pattern showed systolic flattening but not reversal.    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    Need to control blood pressure, will add hydralazine.  I will also have him evaluated by CVTS.   Timothy Palmer 03/24/2014 8:59 AM

## 2014-03-24 NOTE — Procedures (Signed)
I was present at this session.  I have reviewed the session itself and made appropriate changes. BP ^, tol HD well. Access press ok.    Timothy Palmer,Sir L 9/17/20153:15 PM

## 2014-03-24 NOTE — Progress Notes (Signed)
Subjective: TEE performed today which showed MV endocarditis with severe regurgitation and possible perforation but unable to visualize completely. Pt with increased work of breathing since with hypoxia.   Objective: Vital signs in last 24 hours: Filed Vitals:   03/24/14 0910 03/24/14 0919 03/24/14 0920 03/24/14 1001  BP: 203/105  195/95 167/89  Pulse: 102 114 114 112  Temp:    98.8 F (37.1 C)  TempSrc:    Oral  Resp: 19 28 30 18   Height:      Weight:      SpO2: 95% 96% 96% 96%   Weight change: 9.6 oz (0.272 kg)  Intake/Output Summary (Last 24 hours) at 03/24/14 1327 Last data filed at 03/24/14 0906  Gross per 24 hour  Intake    440 ml  Output      0 ml  Net    440 ml   Vitals reviewed. General: Resting in bed, mild distress, muffled voice HEENT: EOMI Cardiac: RRR, 3/6 holosystolic murmur at the apex Pulm: clear to auscultation bilaterally, no wheezes, rales, or rhonchi Abd: soft, nontender, nondistended, BS present Ext: warm and well perfused, no pedal edema Neuro: alert and oriented X3, moves all 4 extremities, nonfocal   Lab Results: Basic Metabolic Panel:  Recent Labs Lab 03/22/14 1142 03/22/14 1349 03/22/14 1922 03/23/14 0014  NA 140  --   --  138  K 3.6*  --   --  4.2  CL 94*  --   --  93*  CO2 33*  --   --  31  GLUCOSE 105*  --   --  106*  BUN 12  --   --  21  CREATININE 5.85*  --   --  8.15*  CALCIUM 9.8  --   --  10.6*  MG  --  1.8  --   --   PHOS  --   --  5.9*  --    CBC:  Recent Labs Lab 03/22/14 1142 03/23/14 0014  WBC 12.3* 11.9*  HGB 9.2* 8.4*  HCT 28.2* 25.6*  MCV 92.8 95.9  PLT 215 187   Cardiac Enzymes:  Recent Labs Lab 03/22/14 1922 03/23/14 0014 03/23/14 0612  TROPONINI <0.30 <0.30 <0.30   BNP:  Recent Labs Lab 03/22/14 1142  PROBNP 30804.0*   Thyroid Function Tests:  Recent Labs Lab 03/22/14 1922  TSH 0.901    Urinalysis: No results found for this basename: COLORURINE, APPERANCEUR, LABSPEC, PHURINE,  GLUCOSEU, HGBUR, BILIRUBINUR, KETONESUR, PROTEINUR, UROBILINOGEN, NITRITE, LEUKOCYTESUR,  in the last 168 hours  Misc. Labs:   Micro Results: Recent Results (from the past 240 hour(s))  CULTURE, BLOOD (ROUTINE X 2)     Status: None   Collection Time    03/22/14  1:52 PM      Result Value Ref Range Status   Specimen Description BLOOD ARM RIGHT   Final   Special Requests BOTTLES DRAWN AEROBIC ONLY 10CC   Final   Culture  Setup Time     Final   Value: 03/22/2014 18:55     Performed at [facility]   Culture     Final   Value:        BLOOD CULTURE RECEIVED NO GROWTH TO DATE CULTURE WILL BE HELD FOR 5 DAYS BEFORE ISSUING A FINAL NEGATIVE REPORT     Performed at [facility]   Report Status PENDING   Incomplete  CULTURE, BLOOD (ROUTINE X 2)     Status: None   Collection  Time    03/22/14  2:00 PM      Result Value Ref Range Status   Specimen Description BLOOD HAND RIGHT   Final   Special Requests BOTTLES DRAWN AEROBIC AND ANAEROBIC 10CC   Final   Culture  Setup Time     Final   Value: 03/22/2014 18:56     Performed at [facility]   Culture     Final   Value:        BLOOD CULTURE RECEIVED NO GROWTH TO DATE CULTURE WILL BE HELD FOR 5 DAYS BEFORE ISSUING A FINAL NEGATIVE REPORT     Performed at [facility]   Report Status PENDING   Incomplete  CULTURE, BLOOD (ROUTINE X 2)     Status: None   Collection Time    03/23/14  7:20 PM      Result Value Ref Range Status   Specimen Description BLOOD RIGHT HAND   Final   Special Requests BOTTLES DRAWN AEROBIC AND ANAEROBIC 5CC   Final   Culture  Setup Time     Final   Value: 03/23/2014 22:25     Performed at [facility]   Culture     Final   Value:        BLOOD CULTURE RECEIVED NO GROWTH TO DATE CULTURE WILL BE HELD FOR 5 DAYS BEFORE ISSUING A FINAL NEGATIVE REPORT     Performed at [facility]   Report Status PENDING   Incomplete   Studies/Results: Dg Chest 2 View  03/23/2014   CLINICAL  DATA:  Weakness, leg pain  EXAM: CHEST  2 VIEW  COMPARISON:  03/22/2014  FINDINGS: Borderline cardiomegaly. Small right pleural effusion with right basilar atelectasis. Central mild vascular congestion and mild perihilar interstitial prominence suspicious for mild interstitial edema. Bony thorax is unremarkable.  IMPRESSION: Small right pleural effusion with right basilar atelectasis. Central mild vascular congestion and mild perihilar interstitial prominence suspicious for mild interstitial edema. No segmental infiltrate.   Electronically Signed   By: Lahoma Crocker M.D.   On: 03/23/2014 09:04   Dg Knee Ap/lat W/sunrise Left  03/23/2014   CLINICAL DATA:  Left knee pain and swelling for 3 days  EXAM: DG KNEE - 3 VIEWS  COMPARISON:  None.  FINDINGS: There is no evidence of fracture, dislocation, or joint effusion. There is no evidence of arthropathy or other focal bone abnormality. Soft tissues are unremarkable. Transverse patellar cleft incidentally noted. Vascular calcifications are noted.  IMPRESSION: Negative.   Electronically Signed   By: Conchita Paris M.D.   On: 03/23/2014 21:17   Medications: I have reviewed the patient's current medications. Scheduled Meds: . amLODipine  10 mg Oral Daily  . ceFEPime (MAXIPIME) IV  2 g Intravenous Q T,Th,Sat-1800  . cinacalcet  60 mg Oral QPC supper  . darbepoetin (ARANESP) injection - DIALYSIS  40 mcg Intravenous Q Thu-HD  . feeding supplement (NEPRO CARB STEADY)  237 mL Oral BID BM  . heparin  5,000 Units Subcutaneous 3 times per day  . heparin  9,000 Units Dialysis Once in dialysis  . hydrALAZINE  25 mg Oral 3 times per day  . lisinopril  20 mg Oral Daily  . sevelamer carbonate  800 mg Oral TID WC  . sodium chloride  3 mL Intravenous Q12H  . sodium chloride  3 mL Intravenous Q12H  . vancomycin  1,000 mg Intravenous Q T,Th,Sa-HD   Continuous Infusions: . sodium chloride 20 mL/hr at 03/24/14 [phone]  PRN Meds:.sodium chloride, sodium chloride, sodium  chloride, alteplase, butamben-tetracaine-benzocaine, feeding supplement (NEPRO CARB STEADY), fentaNYL, heparin, hydrALAZINE, lidocaine (PF), lidocaine-prilocaine, midazolam, pentafluoroprop-tetrafluoroeth, sodium chloride  Assessment/Plan: 48yo M w/ PMH HTN and ESRD on HD presents to the ED with increasing SOB in the setting of a productive cough and fever and was found to have endocarditis.    Bacteremia with mitral valve endocarditis: Pt presented with productive cough with yellow sputum x 3 week with increased SOB and chills at home. Temp 101 in the ED. Mild leukocytosis to 12.3. CXR with mild vascular congestion. Initially thought that he could possibly have pneumonia that did not show up on CXR. CTA chest revealed mediastinal lymphadenopathy and pulmonary edema. Vanc and Cefepime started in the ED to treat empirically, and TTE showed a mobile vegetation on the mitral valve and possible perforation. TEE with similar findings. CT Surgery consulted by Cardiology. Per Nephrology +MSSA blood culture 5/15 and 02/2014, although negative 01/2014; treated with cefazolin 2g with HD starting 8/20 to continue until 10/20.  - ID consult, appreciate recommendations - Cardiology consult, appreciate recommendations  - CT Surgery consult, appreciate recommendations - Continue Vanc/Cefepime   - Blood cultures, NGTD  Acute heart failure: CXR and CTA concerning for CHF. TTE and TTE with mitral valve vegetation and severe regurgitation, also concern for perforation. Cardiology following; CT Surgery consulted. HD today; will likely remove volume.    End stage renal disease on HD: HD TThSa. Dialysis today.  - F/u with Nephrology, appreciate recommendations.   Hypertension: Pt on ACEi at home which was continued on admission. Amlodipine started this admission. BP elevated today, requiring multiple doses of IV Hydralazine. Oral Hydralazine started by Cardiology today. - Continue Amlodipine 10mg  daily - Continue  Lisinopril 20mg  daily - Hydralazine 25mg  q8h  Pain in popliteal fossa: Pt reports a popping sensation behind his left knee that occurred while mowing the grass yesterday. No swelling but pain with ambulation. No edema on edam, but tender to palpation at tendon insertion points in popliteal fossa. Given recent echo findings, concerned for possible septic joint. Ortho saw the patient and was not concerned for infection of the knee.   T wave inversion in EKG: Improved. New diffuse T-wave inversions on EKG on admission. Troponin negative x3. Cardiology curbsided in the ED and felt changes duer to electolyte abnormalities. K 3.6 and Mg 1.8. Replaced K. Labs still pending for today.  QT prolongation: Resolved. QTc >600 when previously normal. Improved this morning. Holding QTc prolonging drugs.     Dispo: Disposition is deferred at this time, awaiting improvement of current medical problems.  Anticipated discharge in approximately 2-4 day(s).   The patient does have a current PCP (Louis Meckel, MD) and does not need an [facility] hospital follow-up appointment after discharge.  The patient does not have transportation limitations that hinder transportation to clinic appointments.  .Services Needed at time of discharge: Y = Yes, Blank = No PT:   OT:   RN:   Equipment:   Other:     LOS: 2 days   Otho Bellows, MD 03/24/2014, 1:27 PM

## 2014-03-24 NOTE — Interval H&P Note (Signed)
History and Physical Interval Note:  03/24/2014 8:14 AM  Timothy Palmer  has presented today for surgery, with the diagnosis of ENDOCARDITIS  The various methods of treatment have been discussed with the patient and family. After consideration of risks, benefits and other options for treatment, the patient has consented to  Procedure(s): TRANSESOPHAGEAL ECHOCARDIOGRAM (TEE) (N/A) as a surgical intervention .  The patient's history has been reviewed, patient examined, no change in status, stable for surgery.  I have reviewed the patient's chart and labs.  Questions were answered to the patient's satisfaction.     Malachi Kinzler [employer]

## 2014-03-24 NOTE — Progress Notes (Signed)
Subjective: Persistent left knee pain with difficulty standing.   Objective: Vital signs in last 24 hours: Temp:  [98.6 F (37 C)-100 F (37.8 C)] 98.9 F (37.2 C) (09/17 1326) Pulse Rate:  [40-114] 100 (09/17 1430) Resp:  [18-55] 36 (09/17 1430) BP: (145-215)/(82-130) 165/89 mmHg (09/17 1430) SpO2:  [90 %-100 %] 97 % (09/17 1341) Weight:  [224 lb 3.2 oz (101.696 kg)-224 lb 10.4 oz (101.9 kg)] 224 lb 10.4 oz (101.9 kg) (09/17 1326)  Intake/Output from previous day: 09/16 0701 - 09/17 0700 In: 600 [P.O.:600] Out: -  Intake/Output this shift: Total I/O In: 320 [P.O.:120; I.V.:200] Out: -    Recent Labs  03/22/14 1142 03/23/14 0014 03/24/14 1300  HGB 9.2* 8.4* 8.2*    Recent Labs  03/23/14 0014 03/24/14 1300  WBC 11.9* 13.5*  RBC 2.67* 2.68*  HCT 25.6* 24.5*  PLT 187 224    Recent Labs  03/23/14 0014 03/24/14 1300  NA 138 139  K 4.2 4.7  CL 93* 94*  CO2 31 26  BUN 21 48*  CREATININE 8.15* 12.46*  GLUCOSE 106* 141*  CALCIUM 10.6* 10.3   No results found for this basename: LABPT, INR,  in the last 72 hours  Neurologically intact ABD soft Neurovascular intact Sensation intact distally Intact pulses distally Dorsiflexion/Plantar flexion intact No cellulitis present Compartment soft Left knee has tender palpation the posterior lateral aspect.  There's no instability.  There's no effusion.  There's range of motion of 0-100 without severe pain.  Assessment/Plan: 48 year old male with history of Potential bacteremiaWith left knee pain greatest with standing.I had a long discussion with the patient today.  I do not feel he has a septic arthritis.  I do feel that if he is not able to increase activity shortly we will need to give consideration to MRI examination.  I will follow the patient while in hospital.   Timothy Palmer 03/24/2014, 3:02 PM

## 2014-03-25 ENCOUNTER — Encounter (HOSPITAL_COMMUNITY): Payer: Medicare Other | Admitting: Certified Registered"

## 2014-03-25 ENCOUNTER — Inpatient Hospital Stay (HOSPITAL_COMMUNITY): Payer: Medicare Other

## 2014-03-25 ENCOUNTER — Inpatient Hospital Stay (HOSPITAL_COMMUNITY): Payer: Medicare Other | Admitting: Anesthesiology

## 2014-03-25 ENCOUNTER — Encounter (HOSPITAL_COMMUNITY): Payer: Medicare Other | Admitting: Anesthesiology

## 2014-03-25 ENCOUNTER — Encounter (HOSPITAL_COMMUNITY): Payer: Self-pay | Admitting: Cardiology

## 2014-03-25 ENCOUNTER — Encounter (HOSPITAL_COMMUNITY)
Admission: EM | Disposition: A | Discharge status: Home / Self Care | Payer: Self-pay | Source: Home / Self Care | Attending: Internal Medicine

## 2014-03-25 ENCOUNTER — Inpatient Hospital Stay (HOSPITAL_COMMUNITY): Payer: Medicare Other | Admitting: Certified Registered"

## 2014-03-25 DIAGNOSIS — A4901 Methicillin susceptible Staphylococcus aureus infection, unspecified site: Secondary | ICD-10-CM

## 2014-03-25 DIAGNOSIS — I70219 Atherosclerosis of native arteries of extremities with intermittent claudication, unspecified extremity: Secondary | ICD-10-CM

## 2014-03-25 DIAGNOSIS — I76 Septic arterial embolism: Secondary | ICD-10-CM | POA: Diagnosis present

## 2014-03-25 HISTORY — PX: FEMORAL-POPLITEAL BYPASS GRAFT: SHX937

## 2014-03-25 HISTORY — PX: EMBOLECTOMY: SHX44

## 2014-03-25 HISTORY — DX: Septic arterial embolism: I76

## 2014-03-25 LAB — PROCALCITONIN: PROCALCITONIN: 6.03 ng/mL

## 2014-03-25 SURGERY — BYPASS GRAFT FEMORAL-POPLITEAL ARTERY
Anesthesia: General | Laterality: Left

## 2014-03-25 SURGERY — EMBOLECTOMY
Anesthesia: Monitor Anesthesia Care | Site: Leg Lower | Laterality: Left

## 2014-03-25 MED ORDER — HYDROMORPHONE HCL 1 MG/ML IJ SOLN
0.2500 mg | INTRAMUSCULAR | Status: DC | PRN
  Administered 2014-03-25 (×4): 0.5 mg via INTRAVENOUS

## 2014-03-25 MED ORDER — ROCURONIUM BROMIDE 100 MG/10ML IV SOLN
INTRAVENOUS | Status: DC | PRN
  Administered 2014-03-25: 25 mg via INTRAVENOUS

## 2014-03-25 MED ORDER — LIDOCAINE HCL (PF) 1 % IJ SOLN: 5.0000 mL | INTRAMUSCULAR | Status: DC | PRN

## 2014-03-25 MED ORDER — DEXTROSE 5 % IV SOLN
1.5000 g | INTRAVENOUS | Status: DC | PRN
  Administered 2014-03-25: 1.5 g via INTRAVENOUS

## 2014-03-25 MED ORDER — FENTANYL CITRATE 0.05 MG/ML IJ SOLN
INTRAMUSCULAR | Status: AC
  Filled 2014-03-25: qty 5

## 2014-03-25 MED ORDER — ROCURONIUM BROMIDE 50 MG/5ML IV SOLN
INTRAVENOUS | Status: AC
  Filled 2014-03-25: qty 1

## 2014-03-25 MED ORDER — PROTAMINE SULFATE 10 MG/ML IV SOLN
INTRAVENOUS | Status: DC | PRN
  Administered 2014-03-25: 50 mg via INTRAVENOUS

## 2014-03-25 MED ORDER — HEPARIN SODIUM (PORCINE) 1000 UNIT/ML IJ SOLN
INTRAMUSCULAR | Status: AC
Start: 2014-03-25 — End: 2014-03-25
  Filled 2014-03-25: qty 1

## 2014-03-25 MED ORDER — OXYCODONE-ACETAMINOPHEN 5-325 MG PO TABS
1.0000 | ORAL_TABLET | ORAL | Status: DC | PRN
  Administered 2014-03-25: 1 via ORAL
  Administered 2014-03-25 – 2014-03-27 (×4): 2 via ORAL
  Filled 2014-03-25 (×4): qty 2

## 2014-03-25 MED ORDER — HYDROMORPHONE HCL 1 MG/ML IJ SOLN
INTRAMUSCULAR | Status: AC
  Administered 2014-03-25: 0.5 mg via INTRAVENOUS
  Filled 2014-03-25: qty 1

## 2014-03-25 MED ORDER — GLYCOPYRROLATE 0.2 MG/ML IJ SOLN
INTRAMUSCULAR | Status: DC | PRN
  Administered 2014-03-25: 0.4 mg via INTRAVENOUS

## 2014-03-25 MED ORDER — SODIUM CHLORIDE 0.9 % IV SOLN
INTRAVENOUS | Status: DC
  Administered 2014-03-25: 10 mL/h via INTRAVENOUS
  Administered 2014-04-05: 09:00:00 via INTRAVENOUS

## 2014-03-25 MED ORDER — SODIUM CHLORIDE 0.9 % IV SOLN: 100.0000 mL | INTRAVENOUS | Status: DC | PRN

## 2014-03-25 MED ORDER — HEPARIN SODIUM (PORCINE) 1000 UNIT/ML IJ SOLN
INTRAMUSCULAR | Status: AC
  Filled 2014-03-25: qty 1

## 2014-03-25 MED ORDER — 0.9 % SODIUM CHLORIDE (POUR BTL) OPTIME
TOPICAL | Status: DC | PRN
  Administered 2014-03-25: 1000 mL

## 2014-03-25 MED ORDER — NEPRO/CARBSTEADY PO LIQD: 237.0000 mL | ORAL | Status: DC | PRN

## 2014-03-25 MED ORDER — NEOSTIGMINE METHYLSULFATE 10 MG/10ML IV SOLN
INTRAVENOUS | Status: AC
  Filled 2014-03-25: qty 2

## 2014-03-25 MED ORDER — PHENYLEPHRINE HCL 10 MG/ML IJ SOLN
10.0000 mg | INTRAMUSCULAR | Status: DC | PRN
  Administered 2014-03-25: 5 ug/min via INTRAVENOUS

## 2014-03-25 MED ORDER — GLYCOPYRROLATE 0.2 MG/ML IJ SOLN
INTRAMUSCULAR | Status: AC
  Filled 2014-03-25: qty 3

## 2014-03-25 MED ORDER — SUCCINYLCHOLINE CHLORIDE 20 MG/ML IJ SOLN
INTRAMUSCULAR | Status: DC | PRN
  Administered 2014-03-25: 140 mg via INTRAVENOUS

## 2014-03-25 MED ORDER — HEPARIN SODIUM (PORCINE) 1000 UNIT/ML IJ SOLN
INTRAMUSCULAR | Status: DC | PRN
  Administered 2014-03-25: 9000 [IU] via INTRAVENOUS

## 2014-03-25 MED ORDER — ONDANSETRON HCL 4 MG/2ML IJ SOLN: 4.0000 mg | Freq: Four times a day (QID) | INTRAMUSCULAR | Status: DC | PRN

## 2014-03-25 MED ORDER — OXYCODONE HCL 5 MG/5ML PO SOLN: 5.0000 mg | Freq: Once | ORAL | Status: DC | PRN

## 2014-03-25 MED ORDER — SUCCINYLCHOLINE CHLORIDE 20 MG/ML IJ SOLN
INTRAMUSCULAR | Status: DC | PRN
  Administered 2014-03-25: 120 mg via INTRAVENOUS

## 2014-03-25 MED ORDER — LIDOCAINE-PRILOCAINE 2.5-2.5 % EX CREA: 1.0000 "application " | TOPICAL_CREAM | CUTANEOUS | Status: DC | PRN

## 2014-03-25 MED ORDER — PROPOFOL 10 MG/ML IV BOLUS
INTRAVENOUS | Status: AC
  Filled 2014-03-25: qty 20

## 2014-03-25 MED ORDER — NEOSTIGMINE METHYLSULFATE 10 MG/10ML IV SOLN
INTRAVENOUS | Status: DC | PRN
  Administered 2014-03-25: 3 mg via INTRAVENOUS

## 2014-03-25 MED ORDER — SODIUM CHLORIDE 0.9 % IR SOLN
Status: DC | PRN
  Administered 2014-03-25: 18:00:00

## 2014-03-25 MED ORDER — OXYCODONE-ACETAMINOPHEN 5-325 MG PO TABS: 1.0000 | ORAL_TABLET | ORAL | Status: DC | PRN

## 2014-03-25 MED ORDER — ONDANSETRON HCL 4 MG/2ML IJ SOLN: 4.0000 mg | Freq: Once | INTRAMUSCULAR | Status: DC | PRN

## 2014-03-25 MED ORDER — NEOSTIGMINE METHYLSULFATE 10 MG/10ML IV SOLN
INTRAVENOUS | Status: AC
  Filled 2014-03-25: qty 1

## 2014-03-25 MED ORDER — HEPARIN SODIUM (PORCINE) 1000 UNIT/ML DIALYSIS: 1000.0000 [IU] | INTRAMUSCULAR | Status: DC | PRN

## 2014-03-25 MED ORDER — ALTEPLASE 2 MG IJ SOLR: 2.0000 mg | Freq: Once | INTRAMUSCULAR | Status: AC | PRN

## 2014-03-25 MED ORDER — PROTAMINE SULFATE 10 MG/ML IV SOLN
INTRAVENOUS | Status: AC
  Filled 2014-03-25: qty 10

## 2014-03-25 MED ORDER — ONDANSETRON HCL 4 MG/2ML IJ SOLN
INTRAMUSCULAR | Status: DC | PRN
  Administered 2014-03-25: 4 mg via INTRAVENOUS

## 2014-03-25 MED ORDER — OXYCODONE-ACETAMINOPHEN 5-325 MG PO TABS
ORAL_TABLET | ORAL | Status: AC
  Administered 2014-03-25: 2 via ORAL
  Filled 2014-03-25: qty 2

## 2014-03-25 MED ORDER — SODIUM CHLORIDE 0.9 % IR SOLN
Status: DC | PRN
  Administered 2014-03-25: 15:00:00

## 2014-03-25 MED ORDER — PROPOFOL 10 MG/ML IV BOLUS
INTRAVENOUS | Status: DC | PRN
  Administered 2014-03-25: 300 mg via INTRAVENOUS

## 2014-03-25 MED ORDER — ALBUMIN HUMAN 5 % IV SOLN
INTRAVENOUS | Status: DC | PRN
  Administered 2014-03-25: 19:00:00 via INTRAVENOUS

## 2014-03-25 MED ORDER — FENTANYL CITRATE 0.05 MG/ML IJ SOLN
INTRAMUSCULAR | Status: DC | PRN
  Administered 2014-03-25: 50 ug via INTRAVENOUS
  Administered 2014-03-25: 100 ug via INTRAVENOUS
  Administered 2014-03-25 (×2): 50 ug via INTRAVENOUS

## 2014-03-25 MED ORDER — HYDRALAZINE HCL 25 MG PO TABS
37.5000 mg | ORAL_TABLET | Freq: Three times a day (TID) | ORAL | Status: DC
  Administered 2014-03-26 – 2014-03-28 (×6): 37.5 mg via ORAL
  Filled 2014-03-25 (×12): qty 1.5

## 2014-03-25 MED ORDER — SUCCINYLCHOLINE CHLORIDE 20 MG/ML IJ SOLN
INTRAMUSCULAR | Status: AC
  Filled 2014-03-25: qty 1

## 2014-03-25 MED ORDER — PROPOFOL 10 MG/ML IV BOLUS
INTRAVENOUS | Status: DC | PRN
  Administered 2014-03-25: 200 mg via INTRAVENOUS

## 2014-03-25 MED ORDER — ONDANSETRON HCL 4 MG/2ML IJ SOLN
INTRAMUSCULAR | Status: AC
  Filled 2014-03-25: qty 2

## 2014-03-25 MED ORDER — MIDAZOLAM HCL 5 MG/5ML IJ SOLN
INTRAMUSCULAR | Status: DC | PRN
  Administered 2014-03-25: 2 mg via INTRAVENOUS

## 2014-03-25 MED ORDER — IOHEXOL 300 MG/ML  SOLN
INTRAMUSCULAR | Status: DC | PRN
Start: 2014-03-25 — End: 2014-03-25
  Administered 2014-03-25: 10 mL via INTRA_ARTERIAL

## 2014-03-25 MED ORDER — PENTAFLUOROPROP-TETRAFLUOROETH EX AERO: 1.0000 "application " | INHALATION_SPRAY | CUTANEOUS | Status: DC | PRN

## 2014-03-25 MED ORDER — SODIUM CHLORIDE 0.9 % IV SOLN
100.0000 mL | INTRAVENOUS | Status: DC | PRN
Start: 2014-03-25 — End: 2014-03-28

## 2014-03-25 MED ORDER — PHENYLEPHRINE HCL 10 MG/ML IJ SOLN
10.0000 mg | INTRAVENOUS | Status: DC | PRN
  Administered 2014-03-25: 40 ug/min via INTRAVENOUS

## 2014-03-25 MED ORDER — LIDOCAINE HCL (CARDIAC) 20 MG/ML IV SOLN
INTRAVENOUS | Status: AC
  Filled 2014-03-25: qty 5

## 2014-03-25 MED ORDER — FENTANYL CITRATE 0.05 MG/ML IJ SOLN
INTRAMUSCULAR | Status: DC | PRN
  Administered 2014-03-25 (×3): 50 ug via INTRAVENOUS

## 2014-03-25 MED ORDER — ROCURONIUM BROMIDE 100 MG/10ML IV SOLN
INTRAVENOUS | Status: DC | PRN
  Administered 2014-03-25: 20 mg via INTRAVENOUS

## 2014-03-25 MED ORDER — CEFUROXIME SODIUM 1.5 G IJ SOLR
1.5000 g | INTRAMUSCULAR | Status: AC
Start: 2014-03-25 — End: 2014-03-26
  Filled 2014-03-25: qty 1.5

## 2014-03-25 MED ORDER — LIDOCAINE HCL (CARDIAC) 20 MG/ML IV SOLN
INTRAVENOUS | Status: DC | PRN
  Administered 2014-03-25: 80 mg via INTRAVENOUS

## 2014-03-25 MED ORDER — MIDAZOLAM HCL 2 MG/2ML IJ SOLN
INTRAMUSCULAR | Status: AC
  Filled 2014-03-25: qty 2

## 2014-03-25 MED ORDER — DEXTROSE 5 % IV SOLN
1.5000 g | Freq: Two times a day (BID) | INTRAVENOUS | Status: AC
  Administered 2014-03-26 (×2): 1.5 g via INTRAVENOUS
  Filled 2014-03-25 (×2): qty 1.5

## 2014-03-25 MED ORDER — PHENYLEPHRINE 40 MCG/ML (10ML) SYRINGE FOR IV PUSH (FOR BLOOD PRESSURE SUPPORT)
PREFILLED_SYRINGE | INTRAVENOUS | Status: AC
  Filled 2014-03-25: qty 10

## 2014-03-25 MED ORDER — PROTAMINE SULFATE 10 MG/ML IV SOLN
INTRAVENOUS | Status: AC
  Filled 2014-03-25: qty 5

## 2014-03-25 MED ORDER — OXYCODONE HCL 5 MG PO TABS: 5.0000 mg | ORAL_TABLET | Freq: Once | ORAL | Status: DC | PRN

## 2014-03-25 SURGICAL SUPPLY — 62 items
BANDAGE ESMARK 6X9 LF (GAUZE/BANDAGES/DRESSINGS) IMPLANT
BENZOIN TINCTURE PRP APPL 2/3 (GAUZE/BANDAGES/DRESSINGS) ×6 IMPLANT
BLADE SURG 10 STRL SS (BLADE) ×3 IMPLANT
BNDG ESMARK 6X9 LF (GAUZE/BANDAGES/DRESSINGS)
CANISTER SUCTION 2500CC (MISCELLANEOUS) ×3 IMPLANT
CANNULA VESSEL 3MM 2 BLNT TIP (CANNULA) ×6 IMPLANT
CANNULA VESSEL W/WING WO/VALVE (CANNULA) IMPLANT
CATH EMB 3FR 40CM (CATHETERS) ×3 IMPLANT
CATH EMB 4FR 80CM (CATHETERS) ×3 IMPLANT
CLIP LIGATING EXTRA MED SLVR (CLIP) ×3 IMPLANT
CLIP LIGATING EXTRA SM BLUE (MISCELLANEOUS) ×3 IMPLANT
CLOSURE STERI-STRIP 1/2X4 (GAUZE/BANDAGES/DRESSINGS) ×2
CLOSURE WOUND 1/2 X4 (GAUZE/BANDAGES/DRESSINGS) ×2
CLSR STERI-STRIP ANTIMIC 1/2X4 (GAUZE/BANDAGES/DRESSINGS) ×4 IMPLANT
COVER PROBE W GEL 5X96 (DRAPES) ×3 IMPLANT
CUFF TOURNIQUET SINGLE 34IN LL (TOURNIQUET CUFF) IMPLANT
CUFF TOURNIQUET SINGLE 44IN (TOURNIQUET CUFF) IMPLANT
DRAIN SNY 10X20 3/4 PERF (WOUND CARE) IMPLANT
DRAPE X-RAY CASS 24X20 (DRAPES) IMPLANT
DRSG COVADERM 4X10 (GAUZE/BANDAGES/DRESSINGS) IMPLANT
DRSG COVADERM 4X6 (GAUZE/BANDAGES/DRESSINGS) ×6 IMPLANT
DRSG COVADERM 4X8 (GAUZE/BANDAGES/DRESSINGS) IMPLANT
ELECT REM PT RETURN 9FT ADLT (ELECTROSURGICAL) ×3
ELECTRODE REM PT RTRN 9FT ADLT (ELECTROSURGICAL) ×1 IMPLANT
EVACUATOR SILICONE 100CC (DRAIN) IMPLANT
GAUZE SPONGE 4X4 12PLY STRL (GAUZE/BANDAGES/DRESSINGS) ×3 IMPLANT
GLOVE BIO SURGEON STRL SZ 6.5 (GLOVE) ×6 IMPLANT
GLOVE BIO SURGEONS STRL SZ 6.5 (GLOVE) ×3
GLOVE BIOGEL PI IND STRL 6.5 (GLOVE) ×4 IMPLANT
GLOVE BIOGEL PI IND STRL 7.5 (GLOVE) ×1 IMPLANT
GLOVE BIOGEL PI INDICATOR 6.5 (GLOVE) ×8
GLOVE BIOGEL PI INDICATOR 7.5 (GLOVE) ×2
GLOVE SS BIOGEL STRL SZ 7.5 (GLOVE) ×1 IMPLANT
GLOVE SUPERSENSE BIOGEL SZ 7.5 (GLOVE) ×2
GLOVE SURG SS PI 7.5 STRL IVOR (GLOVE) ×6 IMPLANT
GOWN STRL REUS W/ TWL LRG LVL3 (GOWN DISPOSABLE) ×5 IMPLANT
GOWN STRL REUS W/TWL LRG LVL3 (GOWN DISPOSABLE) ×10
INSERT FOGARTY SM (MISCELLANEOUS) IMPLANT
KIT BASIN OR (CUSTOM PROCEDURE TRAY) ×3 IMPLANT
KIT ROOM TURNOVER OR (KITS) ×3 IMPLANT
NS IRRIG 1000ML POUR BTL (IV SOLUTION) ×6 IMPLANT
PACK PERIPHERAL VASCULAR (CUSTOM PROCEDURE TRAY) ×3 IMPLANT
PAD ARMBOARD 7.5X6 YLW CONV (MISCELLANEOUS) ×6 IMPLANT
PADDING CAST COTTON 6X4 STRL (CAST SUPPLIES) IMPLANT
SET COLLECT BLD 21X3/4 12 (NEEDLE) IMPLANT
STAPLER VISISTAT 35W (STAPLE) IMPLANT
STOPCOCK 4 WAY LG BORE MALE ST (IV SETS) IMPLANT
STRIP CLOSURE SKIN 1/2X4 (GAUZE/BANDAGES/DRESSINGS) ×4 IMPLANT
SUT ETHILON 3 0 PS 1 (SUTURE) IMPLANT
SUT PROLENE 5 0 C 1 24 (SUTURE) ×3 IMPLANT
SUT PROLENE 6 0 CC (SUTURE) ×6 IMPLANT
SUT SILK 2 0 SH (SUTURE) ×3 IMPLANT
SUT VIC AB 2-0 CTX 36 (SUTURE) ×6 IMPLANT
SUT VIC AB 3-0 SH 27 (SUTURE) ×8
SUT VIC AB 3-0 SH 27X BRD (SUTURE) ×4 IMPLANT
SYR 3ML LL SCALE MARK (SYRINGE) ×3 IMPLANT
SYR TB 1ML LUER SLIP (SYRINGE) ×3 IMPLANT
TOWEL OR 17X24 6PK STRL BLUE (TOWEL DISPOSABLE) IMPLANT
TRAY FOLEY CATH 16FRSI W/METER (SET/KITS/TRAYS/PACK) IMPLANT
TUBING EXTENTION W/L.L. (IV SETS) IMPLANT
UNDERPAD 30X30 INCONTINENT (UNDERPADS AND DIAPERS) ×3 IMPLANT
WATER STERILE IRR 1000ML POUR (IV SOLUTION) ×3 IMPLANT

## 2014-03-25 SURGICAL SUPPLY — 58 items
BANDAGE ESMARK 6X9 LF (GAUZE/BANDAGES/DRESSINGS) IMPLANT
BENZOIN TINCTURE PRP APPL 2/3 (GAUZE/BANDAGES/DRESSINGS) ×3 IMPLANT
BNDG ESMARK 6X9 LF (GAUZE/BANDAGES/DRESSINGS)
CANISTER SUCTION 2500CC (MISCELLANEOUS) ×3 IMPLANT
CATH EMB 3FR 80CM (CATHETERS) IMPLANT
CATH EMB 4FR 80CM (CATHETERS) ×3 IMPLANT
CATH EMB 5FR 80CM (CATHETERS) IMPLANT
CLIP LIGATING EXTRA MED SLVR (CLIP) ×3 IMPLANT
CLIP LIGATING EXTRA SM BLUE (MISCELLANEOUS) ×3 IMPLANT
CLOSURE WOUND 1/2 X4 (GAUZE/BANDAGES/DRESSINGS) ×1
CLOSURE WOUND 1/4X4 (GAUZE/BANDAGES/DRESSINGS) ×1
CONT SPEC 4OZ CLIKSEAL STRL BL (MISCELLANEOUS) ×6 IMPLANT
COVER PROBE W GEL 5X96 (DRAPES) ×3 IMPLANT
COVER SURGICAL LIGHT HANDLE (MISCELLANEOUS) ×3 IMPLANT
CUFF TOURNIQUET SINGLE 34IN LL (TOURNIQUET CUFF) IMPLANT
CUFF TOURNIQUET SINGLE 44IN (TOURNIQUET CUFF) IMPLANT
DRAIN SNY 10X20 3/4 PERF (WOUND CARE) IMPLANT
DRAPE WARM FLUID 44X44 (DRAPE) ×3 IMPLANT
DRAPE X-RAY CASS 24X20 (DRAPES) IMPLANT
DRSG COVADERM 4X8 (GAUZE/BANDAGES/DRESSINGS) IMPLANT
ELECT REM PT RETURN 9FT ADLT (ELECTROSURGICAL) ×3
ELECTRODE REM PT RTRN 9FT ADLT (ELECTROSURGICAL) ×1 IMPLANT
EVACUATOR SILICONE 100CC (DRAIN) IMPLANT
GAUZE SPONGE 4X4 12PLY STRL (GAUZE/BANDAGES/DRESSINGS) ×3 IMPLANT
GLOVE BIOGEL PI IND STRL 6 (GLOVE) ×1 IMPLANT
GLOVE BIOGEL PI INDICATOR 6 (GLOVE) ×2
GLOVE SS BIOGEL STRL SZ 7.5 (GLOVE) ×1 IMPLANT
GLOVE SUPERSENSE BIOGEL SZ 7.5 (GLOVE) ×2
GOWN STRL REUS W/ TWL LRG LVL3 (GOWN DISPOSABLE) ×3 IMPLANT
GOWN STRL REUS W/TWL LRG LVL3 (GOWN DISPOSABLE) ×6
KIT BASIN OR (CUSTOM PROCEDURE TRAY) ×3 IMPLANT
KIT ROOM TURNOVER OR (KITS) ×3 IMPLANT
NS IRRIG 1000ML POUR BTL (IV SOLUTION) ×6 IMPLANT
PACK PERIPHERAL VASCULAR (CUSTOM PROCEDURE TRAY) ×3 IMPLANT
PAD ARMBOARD 7.5X6 YLW CONV (MISCELLANEOUS) ×6 IMPLANT
PADDING CAST COTTON 6X4 STRL (CAST SUPPLIES) IMPLANT
SET COLLECT BLD 21X3/4 12 (NEEDLE) IMPLANT
SET COLLECT BLD 21X3/4 12 PB (MISCELLANEOUS) ×3 IMPLANT
STAPLER VISISTAT 35W (STAPLE) IMPLANT
STOPCOCK 4 WAY LG BORE MALE ST (IV SETS) ×3 IMPLANT
STRIP CLOSURE SKIN 1/2X4 (GAUZE/BANDAGES/DRESSINGS) ×2 IMPLANT
STRIP CLOSURE SKIN 1/4X4 (GAUZE/BANDAGES/DRESSINGS) ×2 IMPLANT
SUT ETHILON 3 0 PS 1 (SUTURE) IMPLANT
SUT PROLENE 5 0 C 1 24 (SUTURE) ×3 IMPLANT
SUT PROLENE 6 0 CC (SUTURE) ×3 IMPLANT
SUT VIC AB 2-0 CTX 36 (SUTURE) ×3 IMPLANT
SUT VIC AB 3-0 SH 27 (SUTURE) ×2
SUT VIC AB 3-0 SH 27X BRD (SUTURE) ×1 IMPLANT
SUT VICRYL 4-0 PS2 18IN ABS (SUTURE) ×3 IMPLANT
SWAB COLLECTION DEVICE MRSA (MISCELLANEOUS) ×3 IMPLANT
SYR 3ML LL SCALE MARK (SYRINGE) ×3 IMPLANT
TAPE CLOTH SOFT 2X10 (GAUZE/BANDAGES/DRESSINGS) ×3 IMPLANT
TOWEL OR 17X24 6PK STRL BLUE (TOWEL DISPOSABLE) ×6 IMPLANT
TOWEL OR 17X26 10 PK STRL BLUE (TOWEL DISPOSABLE) ×6 IMPLANT
TRAY FOLEY CATH 16FRSI W/METER (SET/KITS/TRAYS/PACK) ×3 IMPLANT
TUBING EXTENTION W/L.L. (IV SETS) ×3 IMPLANT
UNDERPAD 30X30 INCONTINENT (UNDERPADS AND DIAPERS) ×3 IMPLANT
WATER STERILE IRR 1000ML POUR (IV SOLUTION) ×3 IMPLANT

## 2014-03-25 NOTE — Progress Notes (Signed)
Subjective:  Awaiting urgent surgery for left popliteal embolus, local tenderness, but comfortable and breathing well on O2 per [place]  Objective: Vital signs in last 24 hours: Temp:  [98 F (36.7 C)-99.1 F (37.3 C)] 98.5 F (36.9 C) (09/18 1203) Pulse Rate:  [95-113] 105 (09/18 1203) Resp:  [20-38] 22 (09/18 1203) BP: (133-166)/(83-100) 150/83 mmHg (09/18 1203) SpO2:  [92 %-100 %] 98 % (09/18 1203) Weight:  [99.6 kg (219 lb 9.3 oz)-101.9 kg (224 lb 10.4 oz)] 99.6 kg (219 lb 9.3 oz) (09/17 2003) Weight change: 0.204 kg (7.2 oz)  Intake/Output from previous day: 09/17 0701 - 09/18 0700 In: 320 [P.O.:120; I.V.:200] Out: 1000  Intake/Output this shift:   EXAM: General appearance:  Alert, in no apparent distress Resp: CTA without rales, rhonchi, or wheezes Cardio:  Tachycardic, regular rhythm, no murmur or rub GI: + BS, soft and nontender Extremities:  No edema Access:  AVF @ RFA with + bruit  Lab Results:  Recent Labs  03/23/14 0014 03/24/14 1300  WBC 11.9* 13.5*  HGB 8.4* 8.2*  HCT 25.6* 24.5*  PLT 187 224   BMET:  Recent Labs  03/23/14 0014 03/24/14 1300  NA 138 139  K 4.2 4.7  CL 93* 94*  CO2 31 26  GLUCOSE 106* 141*  BUN 21 48*  CREATININE 8.15* 12.46*  CALCIUM 10.6* 10.3  ALBUMIN  --  2.3*   No results found for this basename: PTH,  in the last 72 hours Iron Studies: No results found for this basename: IRON, TIBC, TRANSFERRIN, FERRITIN,  in the last 72 hours  Studies/Results: Ct Angio Ao+bifem W/cm &/or Wo/cm  03/25/2014   ADDENDUM REPORT: 03/25/2014 08:50  ADDENDUM: The following should been included in the impression:  Inflammatory change in the subcutaneous fat overlying the deep fascia of the left vastus lateralis muscle.  These result of the entire exam were called by telephone at the time of interpretation on 03/25/2014 at 8:47 am to Dr. Loralie Champagne , who verbally acknowledged these results.   Electronically Signed   By: Jacqulynn Cadet M.D.   On:  03/25/2014 08:50   03/25/2014   CLINICAL DATA:  48 year old male with bacterial endocarditis and acute onset left knee pain. Evaluate for aortoiliac embolic disease or evidence of septic arthritis / septic embolization.  EXAM: CT ANGIOGRAPHY OF ABDOMINAL AORTA WITH ILIOFEMORAL RUNOFF  TECHNIQUE: Multidetector CT imaging of the abdomen, pelvis and lower extremities was performed using the standard protocol during bolus administration of intravenous contrast. Multiplanar CT image reconstructions and MIPs were obtained to evaluate the vascular anatomy.  CONTRAST:  174mL OMNIPAQUE IOHEXOL 350 MG/ML SOLN  COMPARISON:  CT chest 03/22/2014 ; radiographs of the left knee 03/23/2014  FINDINGS: VASCULAR  Aorta: Normal caliber aorta. No significant atherosclerotic plaque or irregular wall thickening. No inflammation common dissection or aneurysmal dilatation.  Celiac: Widely patent. Conventional hepatic arterial anatomy. No evidence of mycotic or other visceral artery aneurysm.  SMA: Widely patent. There is a focal fusiform aneurysm with mild hazy arterial wall thickening arising from a distal ileal branch of the SMA beyond the ileocecal branches to the right of midline in the mid to lower abdomen. Aneurysm measures 1.4 x 1.0 cm in diameter which is significantly larger than the 4.4 mm parent vessel.  Renals: Abhorrent right renal artery anatomy. There is a small retrocaval accessory renal artery to the upper pole. The dominant renal artery originates low on the aorta beyond the origin of the IMA. On the left, there  is a single dominant renal artery and typical anatomic position. Mild focal heterogeneous atherosclerotic plaque versus focal changes of fibromuscular dysplasia results in at least moderate stenosis of the proximal left renal artery. Evaluation is slightly limited by focal motion artifact. The 2 right-sided renal arteries appear patent.  IMA: Widely patent and unremarkable.  RIGHT Lower Extremity  Inflow: Widely  patent and unremarkable comment, internal and external iliac arteries. Trace plaque along the distal common iliac artery without stenosis.  Outflow: The common, profunda wall and superficial femoral arteries are relatively disease free and widely patent. No evidence of pathology. Calcified atherosclerotic plaque is noted in the above the knee popliteal artery resulting in moderate focal stenosis. Remainder the popliteal artery is widely patent.  Runoff: Mild multifocal narrowing of the posterior tibial artery. Three vessel runoff patent to the ankle.  LEFT lower Extremity  Inflow: Widely patent common, internal and external iliac arteries.  Outflow: The common, profunda and superficial femoral arteries are widely patent. There is trace atherosclerotic plaque in the superficial femoral artery distribution without significant stenosis. The entire 5.5 cm length of the left popliteal artery is abnormal. The vessel is nearly occluded proximally with a linear defects which can best be described as a focal dissection. There is irregular inflammatory stranding surrounding the vessel involving the lateral head of the gastrocnemius muscle in the intracompartmental fat within the upper calf. The arterial wall is thickened and poorly defined. The mid segment of the vessel at the level of the knee is diffusely irregular and heterogeneous with intermittent contrast opacification in the lumen. There is a short segment complete occlusion secondary to intraluminal filling defect which then extends distally as a semi occlusive thrombus to the level of the tibial plateau. Below the knee, the popliteal artery regains patency just proximal to the trifurcation.  Runoff: Slightly aberrant trifurcation anatomy. The posterior tibial artery arises proximally hand and there is a anterior tibial-peroneal trunk. The runoff vessels remain patent to the ankle.  Veins: No focal venous abnormality.  Review of the MIP images confirms the above  findings.  NON-VASCULAR  Lower Chest: Incidentally noted gynecomastia. Small right pleural effusion. Calcified right pleural plaque. Combination dependent atelectasis and patchy consolidation in the lower lobes. Trace atherosclerotic calcifications in the coronary arteries. Borderline cardiomegaly. No pericardial effusion. Unremarkable thoracic esophagus.  Abdomen: Unremarkable appearance of the stomach, duodenum, and adrenal glands. There is an intermediate attenuation 4.1 x 4.1 by 2.6 cm lesion in the superior pole of the spleen concerning for possible septic abscess. Normal hepatic contour and configuration. No discrete hepatic lesion. Multiple small stones within the gallbladder lumen and neck. No secondary signs to suggest acute cholecystitis. The portal veins remain patent.  The bilateral kidneys are nearly entirely replaced with cysts. The kidneys are also slightly enlarged. No definite enhancing renal lesion. Evaluation is limited by mild motion artifact. Colonic diverticular disease without CT evidence of active inflammation. No evidence of obstruction or focal bowel wall thickening. Normal appendix in the right lower quadrant. The terminal ileum is unremarkable. No free fluid or suspicious adenopathy. There is a 1.1 cm peripherally calcified structure within the small bowel mesenteric left of midline which measures 1.1 cm in diameter (image 105 series 5). This structure abuts the mesenteric border of the adjacent small bowel. No surrounding desmoplastic change or inflammation.  Pelvis: Abnormal left external iliac station lymph nodes. Although not enlarged by CT criteria (7 and 5 mm in short axis) there is interstitial inflammatory stranding in the perinodal fat.  These are concerning for reactive adenopathy versus adenitis given the patient's known clinical history. No necrosis or abscess. Under distended but diffusely thick-walled bladder. Unremarkable prostate and seminal vesicles. Calcification of the  bilateral vas deferens. No free fluid.  Bones/Soft Tissues: No acute fracture or aggressive appearing lytic or blastic osseous lesion. Transitional anatomy with sacralization of L5. Focal degenerative disc disease at L4-L5 with vacuum disc phenomenon. Localized inflammatory change of the deeper muscular fascia overlying the vastus lateralis muscle in the left thigh.  IMPRESSION: VASCULAR  1. Diffusely abnormal left popliteal artery. The findings most likely represent a combination of septic embolus, mycotic arteritis and dissection. The entire 5.5 cm length of the left popliteal artery is abnormal with dissection proximally in the above the knee popliteal artery, focal complete complete occlusion at the level of the joint and partially occluding septic embolus extending to the tibial plateau. There is surrounding inflammatory change involving the deep intermuscular fat of the upper calf and the lateral head of the gastrocnemius muscle. 2. 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 The aneurysm measures 1.4 x 1.1 cm which is significantly larger than the 4.5 mm parent vessel. No evidence of downstream occlusion or small bowel ischemia. 3. Aberrant left trifurcation anatomy. The posterior tibial artery arises proximally bleeding in anterior tibial - peroneal trunk which then bifurcates. Three vessel runoff remains patent to the ankle. 4. Focal moderate stenosis of the proximal left renal artery. Secondary to motion artifact is unclear if this represents focal fibro fatty atherosclerotic plaque or focal changes of FMD. 5. Aberrant right renal artery anatomy as described above without stenosis or FMD. NON VASCULAR  1. Probable 4.1 cm septic abscess in the upper pole of the spleen. Additional differential considerations include splenic hemangioma, hammertoe mal or complex cyst although these are all considered significantly less likely given the patient's overall clinical  picture. 2. Left external iliac station adenitis, likely reactive and related to the patient's multifocal septic emboli. 3. Bilateral renal enlargement with innumerable cysts of varying complexity. Differential considerations include dialysis associated acquired cystic kidney disease and autosomal dominant polycystic kidney disease. The former is favored. 4. 1.1 cm peripherally calcified structure within the small bowel mesenteric fat adjacent to a loop of distal jejunum/ileum left of midline without evidence of surrounding desmoplastic change. This finding is nonspecific and may represent sequelae of old granulomatous disease, a peripherally calcified duplication cyst, or the residua of prior fat necrosis. Carcinoid or desmoid tumor is considered significantly less likely given the complete absence of surrounding desmoplastic change. Recommend followup CT scan of the abdomen and pelvis in 1 year to confirm stability. 5. Under distended but diffusely thick-walled bladder. Recommend clinical correlation for signs and symptoms of cystitis. 6. Cholelithiasis without secondary signs to suggest acute cholecystitis. 7. Scattered colonic diverticula without evidence of active diverticulitis. 8. Transitional L5 anatomy with associated focal L4-L5 degenerative disc disease.  Signed,  Criselda Peaches, MD  [facility]  [facility]  Electronically Signed: By: Jacqulynn Cadet M.D. On: 03/25/2014 08:35   Dialysis Orders: [facility]  4 hr 103kgs 2K/2Ca 9000 bolus and 1500 mid Heparin 500/1.5  hectorol 9 mch Aranesp 25 mcg   Assessment/Plan: 1. Mitral valve endocarditis - per TEE yesterday; BCs + for MSSA since 11/2013, s/p intermittent Ancef per HD, worsening dyspnea over last 3 wks; heart catheterization on 9/21 with MVR per Dr. Roxy Manns pending. 2. 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 Donnetta Hutching. 3. MSSA baceremia - + BCs since 11/2013, Ancef 2g through 10/20. 4. ESRD - HD on [facility], K 4.7.  HD tomorrow. 5. HTN/Volume - BP 150/83 on Amlodipine 10 mg qd, Hydralazine 37.5 mg q8h, Lisinopril 20 mg qd; wt 99.6 kg s/p net UF 1 L yesterday, below EDW. 6. Anemia - Hgb 8.2, Aranesp 40 mcg on Thurs.  Increase Aranesp. 7. 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 8. Nutrition - Alb 2.3, NPO for surgery.  LOS: 3 days   LYLES,CHARLES 03/25/2014,12:58 PM   I have seen and examined this patient and agree with plan as outlined by C. Lyles, PA-C.  Appreciate assistance/care of Dr. Leonia Reader. Donato Heinz A,MD 03/25/2014 4:22 PM

## 2014-03-25 NOTE — Op Note (Signed)
    OPERATIVE REPORT  DATE OF SURGERY: 03/25/2014  PATIENT: Timothy Palmer, 48 y.o. 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  PHYSICIAN ASSISTANT: Trinh  ANESTHESIA:  Gen.  EBL: 100 ml  Total I/O In: 500 [I.V.:500] Out: -   BLOOD ADMINISTERED: None  DRAINS: None  SPECIMEN: Embolus for permanent section for culture  COUNTS CORRECT:  YES  PLAN OF CARE: To PACU   PATIENT DISPOSITION:  PACU - hemodynamically stable  PROCEDURE DETAILS: Patient's a 48 yo old gentleman with a sudden onset of pain in his left popliteal space and calf and foot 4 days ago. Had progressive difficulty with breathing and was admitted to the hospital. 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 Continued to have discomfort and this morning underwent CT angiogram. This revealed a probable popliteal embolus with clot in the popliteal artery. He did have a high takeoff of the posterior tibial artery at the level of the knee and the intertibial peroneal artery runoff as well. Take to the operating room at this time for embolectomy.  The left groin left leg were prepped and draped in usual sterile fashion. SonoSite ultrasound was used to visualize the saphenous vein which was quite small at the level of the knee. Decision was entered to the level of the saphenous vein to the artery. The artery was thickened but did have a normal pulse at this level. Curvature branches were controlled with 2-0 silk Potts ties. The artery was controlled proximally and distally in the incision. The patient was given 9000 intravenous heparin. After adequate circulation time the popliteal artery was occluded proximally and distally. The artery was opened transversely with 11 blade  suppository Potts scissors. There was no clot at this level. There was good inflow from above. A long Fogarty catheter was positioned down the popliteal artery and very firm thrombus was present and this was removed. This was sent for culture and for permanent section. Did appear to be a granular zone and may have been embolus from a endocarditis and valve. The third catheter would not pass beyond the 20 cm range from the distal above-knee popliteal artery. Intraoperative arteriogram was obtained. This showed no further thrombus in the popliteal artery. There was good runoff through the intertibial and peroneal arteries. The posterior tibial artery to have a high takeoff at the new position. This did not have flow distally and there was extravasation with probable perforation probably catheter. There was possibly some dissection of the posterior tibial artery as well.  The artery was again flushed and the above-knee popliteal artery was closed with running 6-0 Prolene suture. Prior to completion of the closure the usual flushing maneuvers were undertaken. The anastomosis was completed and flow for foot. Patient had good Doppler flow in the intertibial and posterior tibial arteries at the ankle. The calf was soft with no evidence of hematoma or extravasation of blood  The patient was given 50 mg of protamine to reverse the heparin. The wounds were irrigated with saline. Hemostasis was obtained cautery. Wounds were closed with 2-0 Vicryl in the fascia and the skin was closed with 4 0 or Vicryl stitch  Curt Jews, M.D. 03/25/2014 4:20 PM

## 2014-03-25 NOTE — Progress Notes (Signed)
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 Vascular taking the patient to the OR today for popliteal artery embolectomy. Pt c/o pain in the posterior of his left knee. He states that he continue to have "hot flashes" and sweating.  Objective: Vital signs in last 24 hours: Filed Vitals:   03/24/14 2003 03/25/14 0449 03/25/14 1000 03/25/14 1203  BP: 166/93 133/85 150/83 150/83  Pulse: 113 98 95 105  Temp: 98.9 F (37.2 C) 98.2 F (36.8 C) 98 F (36.7 C) 98.5 F (36.9 C)  TempSrc: Oral Oral Oral Oral  Resp: 30 24 22 22   Height: 5\' 11"  (1.803 m)     Weight: 219 lb 9.3 oz (99.6 kg)     SpO2: 92% 98% 98% 98%   Weight change: 7.2 oz (0.204 kg)  Intake/Output Summary (Last 24 hours) at 03/25/14 1558 Last data filed at 03/25/14 1505  Gross per 24 hour  Intake    300 ml  Output   1000 ml  Net   -700 ml   Vitals reviewed. General: Sitting up in bed in pre-op HEENT: EOMI Cardiac: RRR, 3/6 holosystolic murmur at the apex Pulm: clear to auscultation bilaterally, no wheezes, rales, or rhonchi Abd: soft, nontender, nondistended, BS present Ext: warm and well perfused. 1+ DP pulse in right foot, faint DP pulse in left foot.  Neuro: Alert and oriented X3, moves all 4 extremities, nonfocal   Lab Results: Basic Metabolic Panel:  Recent Labs Lab 03/22/14 1142 03/22/14 1349 03/22/14 1922 03/23/14 0014 03/24/14 1300  NA 140  --   --  138 139  K 3.6*  --   --  4.2 4.7  CL 94*  --   --  93* 94*  CO2 33*  --   --  31 26  GLUCOSE 105*  --   --  106* 141*  BUN 12  --   --  21 48*  CREATININE 5.85*  --   --  8.15* 12.46*  CALCIUM 9.8  --   --  10.6* 10.3  MG  --  1.8  --   --   --   PHOS  --   --  5.9*  --  7.2*   CBC:  Recent Labs Lab 03/23/14 0014 03/24/14 1300  WBC 11.9* 13.5*  HGB 8.4* 8.2*  HCT 25.6*  24.5*  MCV 95.9 91.4  PLT 187 224   Cardiac Enzymes:  Recent Labs Lab 03/22/14 1922 03/23/14 0014 03/23/14 0612  TROPONINI <0.30 <0.30 <0.30   BNP:  Recent Labs Lab 03/22/14 1142  PROBNP 30804.0*   Thyroid Function Tests:  Recent Labs Lab 03/22/14 1922  TSH 0.901    Urinalysis: No results found for this basename: COLORURINE, APPERANCEUR, LABSPEC, PHURINE, GLUCOSEU, HGBUR, BILIRUBINUR, KETONESUR, PROTEINUR, UROBILINOGEN, NITRITE, LEUKOCYTESUR,  in the last 168 hours  Misc. Labs:   Micro Results: Recent Results (from the past 240 hour(s))  CULTURE, BLOOD (ROUTINE X 2)     Status: None   Collection Time    03/22/14  1:52 PM      Result Value Ref Range Status   Specimen Description BLOOD ARM RIGHT   Final   Special Requests BOTTLES DRAWN AEROBIC ONLY 10CC   Final   Culture  Setup Time     Final   Value: 03/22/2014 18:55     Performed  at [facility]     Final   Value:        BLOOD CULTURE RECEIVED NO GROWTH TO DATE CULTURE WILL BE HELD FOR 5 DAYS BEFORE ISSUING A FINAL NEGATIVE REPORT     Performed at [facility]   Report Status PENDING   Incomplete  CULTURE, BLOOD (ROUTINE X 2)     Status: None   Collection Time    03/22/14  2:00 PM      Result Value Ref Range Status   Specimen Description BLOOD HAND RIGHT   Final   Special Requests BOTTLES DRAWN AEROBIC AND ANAEROBIC 10CC   Final   Culture  Setup Time     Final   Value: 03/22/2014 18:56     Performed at [facility]   Culture     Final   Value:        BLOOD CULTURE RECEIVED NO GROWTH TO DATE CULTURE WILL BE HELD FOR 5 DAYS BEFORE ISSUING A FINAL NEGATIVE REPORT     Performed at [facility]   Report Status PENDING   Incomplete  CULTURE, BLOOD (ROUTINE X 2)     Status: None   Collection Time    03/23/14  7:20 PM      Result Value Ref Range Status   Specimen Description BLOOD RIGHT HAND   Final   Special Requests BOTTLES DRAWN AEROBIC AND ANAEROBIC 5CC   Final     Culture  Setup Time     Final   Value: 03/23/2014 22:25     Performed at [facility]   Culture     Final   Value:        BLOOD CULTURE RECEIVED NO GROWTH TO DATE CULTURE WILL BE HELD FOR 5 DAYS BEFORE ISSUING A FINAL NEGATIVE REPORT     Performed at [facility]   Report Status PENDING   Incomplete   Studies/Results: Dg Orthopantogram  03/25/2014   CLINICAL DATA:  Fever.  EXAM: ORTHOPANTOGRAM/PANORAMIC  COMPARISON:  None.  FINDINGS: Poor dentition is noted. No lytic destruction is seen to suggest osteomyelitis or bony abscess.  IMPRESSION: No definite evidence of osteomyelitis or bony abscess seen in the mandible.   Electronically Signed   By: Sabino Dick M.D.   On: 03/25/2014 09:06   Dg Chest 2 View  03/25/2014   CLINICAL DATA:  Fever  EXAM: CHEST  2 VIEW  COMPARISON:  03/23/2014  FINDINGS: Heart size is mildly enlarged with persistent central vascular congestion. Trace pleural effusions are slightly larger than previously. No focal lobar consolidation. No pneumothorax. No acute osseous finding.  IMPRESSION: Cardiomegaly with central vascular congestion and trace effusions.   Electronically Signed   By: Conchita Paris M.D.   On: 03/25/2014 09:03   Mr Brain Wo Contrast  03/25/2014   CLINICAL DATA:  Fever, chills, and dyspnea. Endocarditis. Evaluate for embolic stroke.  EXAM: MRI HEAD WITHOUT CONTRAST  TECHNIQUE: Multiplanar, multiecho pulse sequences of the brain and surrounding structures were obtained without intravenous contrast.  COMPARISON:  None.  FINDINGS: There is no evidence of acute infarct, intracranial hemorrhage, mass, midline shift, or extra-axial fluid collection. Ventricles and sulci are within normal limits for age. A few punctate foci of T2 hyperintensity in the white matter of the frontal lobes are nonspecific and likely within normal limits for age.  Orbits are unremarkable. Paranasal sinuses and mastoid air cells are clear. Major intracranial vascular flow  voids are preserved.  IMPRESSION: No acute infarct.  Unremarkable appearance of the brain for age.   Electronically Signed   By: Logan Bores   On: 03/25/2014 10:20   Ct Angio Ao+bifem W/cm &/or Wo/cm  03/25/2014   ADDENDUM REPORT: 03/25/2014 08:50  ADDENDUM: The following should been included in the impression:  Inflammatory change in the subcutaneous fat overlying the deep fascia of the left vastus lateralis muscle.  These result of the entire exam were called by telephone at the time of interpretation on 03/25/2014 at 8:47 am to Dr. Loralie Champagne , who verbally acknowledged these results.   Electronically Signed   By: Jacqulynn Cadet M.D.   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 Evaluate for aortoiliac embolic disease or evidence of septic arthritis / septic embolization.  EXAM: CT ANGIOGRAPHY OF ABDOMINAL AORTA WITH ILIOFEMORAL RUNOFF  TECHNIQUE: Multidetector CT imaging of the abdomen, pelvis and lower extremities was performed using the standard protocol during bolus administration of intravenous contrast. Multiplanar CT image reconstructions and MIPs were obtained to evaluate the vascular anatomy.  CONTRAST:  149mL OMNIPAQUE IOHEXOL 350 MG/ML SOLN  COMPARISON:  CT chest 03/22/2014 ; radiographs of the left knee 03/23/2014  FINDINGS: VASCULAR  Aorta: Normal caliber aorta. No significant atherosclerotic plaque or irregular wall thickening. No inflammation common dissection or aneurysmal dilatation.  Celiac: Widely patent. Conventional hepatic arterial anatomy. No evidence of mycotic or other visceral artery aneurysm.  SMA: Widely patent. There is a focal fusiform aneurysm with mild hazy arterial wall thickening arising from a distal ileal branch of the SMA beyond the ileocecal branches to the right of midline in the mid to lower abdomen. Aneurysm measures 1.4 x 1.0 cm in diameter which is significantly larger than the  4.4 mm parent vessel.  Renals: Abhorrent right renal artery anatomy. There is a small retrocaval accessory renal artery to the upper pole. The dominant renal artery originates low on the aorta beyond the origin of the IMA. On the left, there is a single dominant renal artery and typical anatomic position. Mild focal heterogeneous atherosclerotic plaque versus focal changes of fibromuscular dysplasia results in at least moderate stenosis of the proximal left renal artery. Evaluation is slightly limited by focal motion artifact. The 2 right-sided renal arteries appear patent.  IMA: Widely patent and unremarkable.  RIGHT Lower Extremity  Inflow: Widely patent and unremarkable comment, internal and external iliac arteries. Trace plaque along the distal common iliac artery without stenosis.  Outflow: The common, profunda wall and superficial femoral arteries are relatively disease free and widely patent. No evidence of pathology. Calcified atherosclerotic plaque is noted in the above the knee popliteal artery resulting in moderate focal stenosis. Remainder the popliteal artery is widely patent.  Runoff: Mild multifocal narrowing of the posterior tibial artery. Three vessel runoff patent to the ankle.  LEFT lower Extremity  Inflow: Widely patent common, internal and external iliac arteries.  Outflow: The common, profunda and superficial femoral arteries are widely patent. There is trace atherosclerotic plaque in the superficial femoral artery distribution without significant stenosis. The entire 5.5 cm length of the left popliteal artery is abnormal. The vessel is nearly occluded proximally with a linear defects which can best be described as a focal dissection. There is irregular inflammatory stranding surrounding the vessel involving the lateral head of the gastrocnemius muscle in the intracompartmental fat within the upper calf. The arterial wall is thickened and poorly defined.  The mid segment of the vessel at the  level of the knee is diffusely irregular and heterogeneous with intermittent contrast opacification in the lumen. There is a short segment complete occlusion secondary to intraluminal filling defect which then extends distally as a semi occlusive thrombus to the level of the tibial plateau. Below the knee, the popliteal artery regains patency just proximal to the trifurcation.  Runoff: Slightly aberrant trifurcation anatomy. The posterior tibial artery arises proximally hand and there is a anterior tibial-peroneal trunk. The runoff vessels remain patent to the ankle.  Veins: No focal venous abnormality.  Review of the MIP images confirms the above findings.  NON-VASCULAR  Lower Chest: Incidentally noted gynecomastia. Small right pleural effusion. Calcified right pleural plaque. Combination dependent atelectasis and patchy consolidation in the lower lobes. Trace atherosclerotic calcifications in the coronary arteries. Borderline cardiomegaly. No pericardial effusion. Unremarkable thoracic esophagus.  Abdomen: Unremarkable appearance of the stomach, duodenum, and adrenal glands. There is an intermediate attenuation 4.1 x 4.1 by 2.6 cm lesion in the superior pole of the spleen concerning for possible septic abscess. Normal hepatic contour and configuration. No discrete hepatic lesion. Multiple small stones within the gallbladder lumen and neck. No secondary signs to suggest acute cholecystitis. The portal veins remain patent.  The bilateral kidneys are nearly entirely replaced with cysts. The kidneys are also slightly enlarged. No definite enhancing renal lesion. Evaluation is limited by mild motion artifact. Colonic diverticular disease without CT evidence of active inflammation. No evidence of obstruction or focal bowel wall thickening. Normal appendix in the right lower quadrant. The terminal ileum is unremarkable. No free fluid or suspicious adenopathy. There is a 1.1 cm peripherally calcified structure within the  small bowel mesenteric left of midline which measures 1.1 cm in diameter (image 105 series 5). This structure abuts the mesenteric border of the adjacent small bowel. No surrounding desmoplastic change or inflammation.  Pelvis: Abnormal left external iliac station lymph nodes. Although not enlarged by CT criteria (7 and 5 mm in short axis) there is interstitial inflammatory stranding in the perinodal fat. These are concerning for reactive adenopathy versus adenitis given the patient's known clinical history. No necrosis or abscess. Under distended but diffusely thick-walled bladder. Unremarkable prostate and seminal vesicles. Calcification of the bilateral vas deferens. No free fluid.  Bones/Soft Tissues: No acute fracture or aggressive appearing lytic or blastic osseous lesion. Transitional anatomy with sacralization of L5. Focal degenerative disc disease at L4-L5 with vacuum disc phenomenon. Localized inflammatory change of the deeper muscular fascia overlying the vastus lateralis muscle in the left thigh.  IMPRESSION: VASCULAR  1. Diffusely abnormal left popliteal artery. The findings most likely represent a combination of septic embolus, mycotic arteritis and dissection. The entire 5.5 cm length of the left popliteal artery is abnormal with dissection proximally in the above the knee popliteal artery, focal complete complete occlusion at the level of the joint and partially occluding septic embolus extending to the tibial plateau. There is surrounding inflammatory change involving the deep intermuscular fat of the upper calf and the lateral head of the gastrocnemius muscle. 2. 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 The aneurysm measures 1.4 x 1.1 cm which is significantly larger than the 4.5 mm parent vessel. No evidence of downstream occlusion or small bowel ischemia. 3. Aberrant left trifurcation anatomy. The posterior tibial artery arises proximally bleeding in  anterior tibial - peroneal trunk which then bifurcates. Three vessel runoff remains patent to the ankle. 4. Focal  moderate stenosis of the proximal left renal artery. Secondary to motion artifact is unclear if this represents focal fibro fatty atherosclerotic plaque or focal changes of FMD. 5. Aberrant right renal artery anatomy as described above without stenosis or FMD. NON VASCULAR  1. Probable 4.1 cm septic abscess in the upper pole of the spleen. Additional differential considerations include splenic hemangioma, hammertoe mal or complex cyst although these are all considered significantly less likely given the patient's overall clinical picture. 2. Left external iliac station adenitis, likely reactive and related to the patient's multifocal septic emboli. 3. Bilateral renal enlargement with innumerable cysts of varying complexity. Differential considerations include dialysis associated acquired cystic kidney disease and autosomal dominant polycystic kidney disease. The former is favored. 4. 1.1 cm peripherally calcified structure within the small bowel mesenteric fat adjacent to a loop of distal jejunum/ileum left of midline without evidence of surrounding desmoplastic change. This finding is nonspecific and may represent sequelae of old granulomatous disease, a peripherally calcified duplication cyst, or the residua of prior fat necrosis. Carcinoid or desmoid tumor is considered significantly less likely given the complete absence of surrounding desmoplastic change. Recommend followup CT scan of the abdomen and pelvis in 1 year to confirm stability. 5. Under distended but diffusely thick-walled bladder. Recommend clinical correlation for signs and symptoms of cystitis. 6. Cholelithiasis without secondary signs to suggest acute cholecystitis. 7. Scattered colonic diverticula without evidence of active diverticulitis. 8. Transitional L5 anatomy with associated focal L4-L5 degenerative disc disease.  Signed,   Criselda Peaches, MD  [facility]  [facility]  Electronically Signed: By: Jacqulynn Cadet M.D. On: 03/25/2014 08:35   Mr Knee Left  Wo Contrast  03/25/2014   CLINICAL DATA:  Popping injury left knee during yardwork. Numbness in the knee. Pain.  EXAM: MRI OF THE LEFT KNEE WITHOUT CONTRAST  TECHNIQUE: Multiplanar, multisequence MR imaging of the knee was performed. No intravenous contrast was administered.  COMPARISON:  None.  FINDINGS: Despite efforts by the technologist and patient, motion artifact is present on today's exam and could not be eliminated. This reduces exam sensitivity and specificity.  MENISCI  Medial meniscus:  Intact  Lateral meniscus:  Intact  LIGAMENTS  Cruciates:  Intact  Collaterals:  Intact  CARTILAGE  Patellofemoral:  Intact  Medial:  Intact  Lateral:  Intact  Joint:  Small knee effusion.  Popliteal Fossa: Abnormal infiltrative edema in the popliteal fossa abnormal edema in the distal biceps femorals muscle. Plantaris tendon seems intact. 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 The medial head gastrocnemius is also edematous. Small Baker's cyst. Proximal soleus muscle intact.  Extensor Mechanism: Proximal patellar partial tear potentially with a small partial avulsion from the inferior pole of the patella.  Bones:  Intact except as noted above.  IMPRESSION: 1. Muscle injury posteriorly with abnormal edema in the biceps femoris, medial head gastrocnemius, and lateral head gastrocnemius. 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 2. Proximal patellar tendon partial tear with a small partially avulsion from the inferior pole of the patella. 3. Small knee effusion.  Small Baker's cyst.   Electronically Signed   By: Sherryl Barters M.D.   On: 03/25/2014 10:45   Dg Knee Ap/lat W/sunrise  Left  03/23/2014   CLINICAL DATA:  Left knee pain and swelling for 3 days  EXAM: DG KNEE - 3 VIEWS  COMPARISON:  None.  FINDINGS: There is no evidence of fracture, dislocation, or joint effusion. There is no evidence of arthropathy or other focal bone abnormality. Soft tissues are unremarkable. Transverse patellar cleft incidentally noted. Vascular calcifications are noted.  IMPRESSION: Negative.   Electronically Signed   By: Conchita Paris M.D.   On: 03/23/2014 21:17   Medications: I have reviewed the patient's current medications. Scheduled Meds: . [MAR HOLD] amLODipine  10 mg Oral Daily  . [MAR HOLD]  ceFAZolin (ANCEF) IV  2 g Intravenous Q T,Th,Sat-1800  . [MAR HOLD] cefUROXime (ZINACEF)  IV  1.5 g Intravenous On Call to OR  . [facility] HOLD] chlorhexidine  15 mL Mouth/Throat BID  . [facility] HOLD] cinacalcet  60 mg Oral QPC supper  . [MAR HOLD] darbepoetin (ARANESP) injection - DIALYSIS  40 mcg Intravenous Q Thu-HD  . [MAR HOLD] feeding supplement (NEPRO CARB STEADY)  237 mL Oral BID BM  . [MAR HOLD] heparin  5,000 Units Subcutaneous 3 times per day  . [facility] HOLD] hydrALAZINE  37.5 mg Oral 3 times per day  . [MAR HOLD] lisinopril  20 mg Oral Daily  . [facility] HOLD] sevelamer carbonate  800 mg Oral TID WC  . [MAR HOLD] sodium chloride  3 mL Intravenous Q12H  . [facility] HOLD] sodium chloride  3 mL Intravenous Q12H   Continuous Infusions: . sodium chloride 20 mL/hr at 03/24/14 0607  . sodium chloride 10 mL/hr (03/25/14 1338)   PRN Meds:.[MAR HOLD] sodium chloride, 0.9 % irrigation (POUR BTL), butamben-tetracaine-benzocaine, fentaNYL, heparin 6000 unit irrigation, hydrALAZINE, iohexol, midazolam, [MAR HOLD] oxyCODONE-acetaminophen, [MAR HOLD] sodium chloride  Assessment/Plan: 48yo M w/ PMH HTN and ESRD on HD presents to the ED with increasing SOB in the setting of a productive cough and fever and was found to have endocarditis.    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  Per Nephrology +MSSA blood culture 5/15 and 02/2014, although negative 01/2014; treated with cefazolin 2g with HD starting 8/20 to continue until 10/20. TEE with similar findings. CT Surgery consulted by Cardiology, and Cards is to take the patient for left and right heart catheterization on Monday. 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 Also with 4cm probable septic abscess in upper pole of the spleen. ABX changes to Ancef. - ID consult, appreciate recommendations - Cardiology consult, appreciate recommendations  - CT Surgery consult, appreciate recommendations - VVS consult, appreciate recommendations --> OR today for embolectomy  - Continue Ancef per ID recommendations    - Blood cultures, NGTD  Acute heart failure: CXR and CTA concerning for CHF. TTE and TTE with mitral valve vegetation and severe regurgitation, also concern for perforation. Cardiology following; CT Surgery consulted. HD 9/18 w/ 1L fluid removed.   End stage renal disease on HD: HD TThSa. Dialysis 9/18.  - F/u with Nephrology, appreciate recommendations.   Hypertension: Pt on ACEi at home which was continued on admission. Amlodipine started this admission. BP elevated today, requiring multiple doses of IV Hydralazine. Oral Hydralazine started by Cardiology. - Continue Amlodipine 10mg  daily - Continue Lisinopril 20mg  daily - Hydralazine 37.5mg  q8h  T wave inversion in EKG: Improved. New diffuse T-wave inversions on EKG on admission. Troponin negative x3. Cardiology curbsided in the ED and felt changes duer to electolyte abnormalities. K 3.6 and Mg 1.8. Replaced K. Labs  still pending for today.  QT prolongation: Resolved. QTc >600 when previously normal. Improved on repeat. Holding QTc prolonging drugs.     Dispo: Disposition is deferred at this  time, awaiting improvement of current medical problems.  Anticipated discharge in approximately 2-4 day(s).    The patient does have a current PCP (Louis Meckel, MD) and does not need an [facility] hospital follow-up appointment after discharge.  The patient does not have transportation limitations that hinder transportation to clinic appointments.  .Services Needed at time of discharge: Y = Yes, Blank = No PT:   OT:   RN:   Equipment:   Other:     LOS: 3 days   Otho Bellows, MD 03/25/2014, 3:58 PM

## 2014-03-25 NOTE — Progress Notes (Signed)
  I have seen and examined the patient, and reviewed the daily progress note by Dahlia Bailiff, MS IV and discussed the care of the patient with him. Please see my progress note from 03/24/2014 for further details regarding assessment and plan.    Signed:  Otho Bellows, MD 03/25/2014, 1:01 PM

## 2014-03-25 NOTE — Progress Notes (Signed)
Note/chart reviewed.  Katie Shaheen Mende, RD, LDN Pager #: [phone] After-Hours Pager #: [phone]  

## 2014-03-25 NOTE — Progress Notes (Addendum)
Patient ID: Timothy Palmer, male   DOB: 18-Aug-1965, 48 y.o.   MRN: [id number]    SUBJECTIVE: Had HD yesterday, still some dyspnea.  Left knee still hurts.   Scheduled Meds: . amLODipine  10 mg Oral Daily  .  ceFAZolin (ANCEF) IV  2 g Intravenous Q T,Th,Sat-1800  . chlorhexidine  15 mL Mouth/Throat BID  . cinacalcet  60 mg Oral QPC supper  . darbepoetin (ARANESP) injection - DIALYSIS  40 mcg Intravenous Q Thu-HD  . feeding supplement (NEPRO CARB STEADY)  237 mL Oral BID BM  . heparin  5,000 Units Subcutaneous 3 times per day  . hydrALAZINE  37.5 mg Oral 3 times per day  . lisinopril  20 mg Oral Daily  . sevelamer carbonate  800 mg Oral TID WC  . sodium chloride  3 mL Intravenous Q12H  . sodium chloride  3 mL Intravenous Q12H   Continuous Infusions: . sodium chloride 20 mL/hr at 03/24/14 0607   PRN Meds:.sodium chloride, butamben-tetracaine-benzocaine, fentaNYL, hydrALAZINE, midazolam, oxyCODONE-acetaminophen, sodium chloride    Filed Vitals:   03/24/14 1730 03/24/14 1745 03/24/14 2003 03/25/14 0449  BP: 165/100 165/98 166/93 133/85  Pulse: 102 103 113 98  Temp:  99.1 F (37.3 C) 98.9 F (37.2 C) 98.2 F (36.8 C)  TempSrc:  Oral Oral Oral  Resp: 37 38 30 24  Height:   5\' 11"  (1.803 m)   Weight:  219 lb 9.3 oz (99.6 kg) 219 lb 9.3 oz (99.6 kg)   SpO2: 100% 100% 92% 98%    Intake/Output Summary (Last 24 hours) at 03/25/14 0807 Last data filed at 03/24/14 1745  Gross per 24 hour  Intake    320 ml  Output   1000 ml  Net   -680 ml    LABS: Basic Metabolic Panel:  Recent Labs  03/22/14 1142 03/22/14 1349 03/22/14 1922 03/23/14 0014 03/24/14 1300  NA 140  --   --  138 139  K 3.6*  --   --  4.2 4.7  CL 94*  --   --  93* 94*  CO2 33*  --   --  31 26  GLUCOSE 105*  --   --  106* 141*  BUN 12  --   --  21 48*  CREATININE 5.85*  --   --  8.15* 12.46*  CALCIUM 9.8  --   --  10.6* 10.3  MG  --  1.8  --   --   --   PHOS  --   --  5.9*  --  7.2*   Liver Function  Tests:  Recent Labs  03/24/14 1300  ALBUMIN 2.3*   No results found for this basename: LIPASE, AMYLASE,  in the last 72 hours CBC:  Recent Labs  03/23/14 0014 03/24/14 1300  WBC 11.9* 13.5*  HGB 8.4* 8.2*  HCT 25.6* 24.5*  MCV 95.9 91.4  PLT 187 224   Cardiac Enzymes:  Recent Labs  03/22/14 1922 03/23/14 0014 03/23/14 0612  TROPONINI <0.30 <0.30 <0.30   BNP: No components found with this basename: POCBNP,  D-Dimer: No results found for this basename: DDIMER,  in the last 72 hours Hemoglobin A1C: No results found for this basename: HGBA1C,  in the last 72 hours Fasting Lipid Panel: No results found for this basename: CHOL, HDL, LDLCALC, TRIG, CHOLHDL, LDLDIRECT,  in the last 72 hours Thyroid Function Tests:  Recent Labs  03/22/14 1922  TSH 0.901   Anemia Panel: No results  found for this basename: VITAMINB12, FOLATE, FERRITIN, TIBC, IRON, RETICCTPCT,  in the last 72 hours  RADIOLOGY: Dg Chest 2 View  03/23/2014   CLINICAL DATA:  Weakness, leg pain  EXAM: CHEST  2 VIEW  COMPARISON:  03/22/2014  FINDINGS: Borderline cardiomegaly. Small right pleural effusion with right basilar atelectasis. Central mild vascular congestion and mild perihilar interstitial prominence suspicious for mild interstitial edema. Bony thorax is unremarkable.  IMPRESSION: Small right pleural effusion with right basilar atelectasis. Central mild vascular congestion and mild perihilar interstitial prominence suspicious for mild interstitial edema. No segmental infiltrate.   Electronically Signed   By: Lahoma Crocker M.D.   On: 03/23/2014 09:04   Dg Chest 2 View  03/22/2014   CLINICAL DATA:  Shortness of breath  EXAM: CHEST  2 VIEW  COMPARISON:  07/23/2013  FINDINGS: There is no focal parenchymal opacity, pleural effusion, or pneumothorax. Mild pulmonary vascular congestion. There is mild cardiomegaly.  The osseous structures are unremarkable.  IMPRESSION: Mild pulmonary vascular congestion. Otherwise  no active cardiopulmonary disease.   Electronically Signed   By: Kathreen Devoid   On: 03/22/2014 12:14   Ct Angio Chest W/cm &/or Wo Cm  03/22/2014   CLINICAL DATA:  Short of breath.  Cough.  EXAM: CT ANGIOGRAPHY CHEST WITH CONTRAST  TECHNIQUE: Multidetector CT imaging of the chest was performed using the standard protocol during bolus administration of intravenous contrast. Multiplanar CT image reconstructions and MIPs were obtained to evaluate the vascular anatomy.  CONTRAST:  8mL OMNIPAQUE IOHEXOL 350 MG/ML SOLN  COMPARISON:  09/12/2012  FINDINGS: No evidence of a pulmonary embolus.  Heart is mildly enlarged. There are minor coronary artery calcifications. Great vessels are normal in caliber.  There is mediastinal adenopathy. Multiple reference measurements were made. There is a 14 mm short axis node in the right superior mediastinum. There is a 19 mm short axis prevascular lymph node adjacent the left pulmonary artery. There is a 15 mm right subcarinal lymph node. There are several other prominent to mildly enlarged lymph nodes. Although there were prominent nodes previously, the larger nodes have increased in size. There are prominent hilar lymph nodes not well defined, and without change.  Small right pleural effusion.  No left effusion.  Stable calcified pleural plaque over the right hemidiaphragm.  Lungs show mild diffuse interstitial thickening and hazy and somewhat heterogeneous airspace opacities which have a central to upper lobe predominance. Interstitial thickening is greatest in the lung bases.  Limited evaluation of the upper abdomen shows gallstones but is otherwise unremarkable.  No osteoblastic or osteolytic lesions.  Review of the MIP images confirms the above findings.  IMPRESSION: 1. No evidence of a pulmonary embolism. 2. Mild cardiomegaly, interstitial thickening and hazy areas of airspace lung opacity, as well as a small right effusion, all suggests congestive heart failure. However, there  also prominent and mildly enlarged mediastinal lymph nodes. These are likely reactive. The possibility of lung infection should be considered in the proper clinical setting.   Electronically Signed   By: Lajean Manes M.D.   On: 03/22/2014 13:09   Dg Knee Ap/lat W/sunrise Left  03/23/2014   CLINICAL DATA:  Left knee pain and swelling for 3 days  EXAM: DG KNEE - 3 VIEWS  COMPARISON:  None.  FINDINGS: There is no evidence of fracture, dislocation, or joint effusion. There is no evidence of arthropathy or other focal bone abnormality. Soft tissues are unremarkable. Transverse patellar cleft incidentally noted. Vascular calcifications are noted.  IMPRESSION:  Negative.   Electronically Signed   By: Conchita Paris M.D.   On: 03/23/2014 21:17    PHYSICAL EXAM General: NAD Neck: Thick, JVP 8-9 cm, no thyromegaly or thyroid nodule.  Lungs: Clear to auscultation bilaterally with normal respiratory effort. CV: Nondisplaced PMI.  Heart regular S1/S2, +S3, 2/6 HSM apex.  No peripheral edema.  No carotid bruit.  Normal pedal pulses.  Abdomen: Soft, nontender, no hepatosplenomegaly, no distention.  Neurologic: Alert and oriented x 3.  Psych: Normal affect. Extremities: No clubbing or cyanosis.   TELEMETRY: Reviewed telemetry pt in NSR  ASSESSMENT AND PLAN: 48 yo with ESRD on HD via left arm fistula and HTN presented with fever and dyspnea with evidence for CHF.  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  1. 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  Blood culture negative so far but had MSSA in 5/15 and 8/15.  He is getting Ancef IV now.   Plan for MRI head to rule out septic emboli and MRI knee to assess for septic arthritis.  2. Mitral regurgitation: Severe by TEE.  Small vegetation noted on the posterior leaflet.  Possible perforation.  Seen by Dr Roxy Manns, given severe MR with CHF, I think he will need valve repair/replacement.  Will need RHC/LHC prior, I will plan to do this Monday.    3. CHF: Acute diastolic CHF in the setting of severe MR.  Fluid removal via HD.  Will work on afterload control by titrating up meds.  Increase hydralazine today.  4. Left knee pain: Per ortho, probably not septic arthritis but will need full evaluation with MRI.  Also getting CTA with runoff to assess vasculature for septic emboli.   Loralie Champagne 03/25/2014 8:13 AM  See CTA report from this morning.  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  Will involve vascular surgery.   Loralie Champagne 03/25/2014

## 2014-03-25 NOTE — Op Note (Signed)
OPERATIVE REPORT  DATE OF SURGERY: 03/25/2014  PATIENT: Timothy Palmer, 48 y.o. male MRN: [id number]  DOB: April 20, 1966  PRE-OPERATIVE DIAGNOSIS: Recurrent ischemia of left lower extremity  POST-OPERATIVE DIAGNOSIS:  Same  PROCEDURE: Above-knee to below-knee popliteal bypass with reversed great saphenous vein  SURGEON:  Curt Jews, M.D.  PHYSICIAN ASSISTANT: Trinh  ANESTHESIA:  Gen.  EBL: 100 ml  Total I/O In: 450 [I.V.:200; IV Piggyback:250] Out: 100 [Blood:100]  BLOOD ADMINISTERED: None  DRAINS: None  SPECIMEN: None  COUNTS CORRECT:  YES  PLAN OF CARE: To PACU   PATIENT DISPOSITION:  PACU - hemodynamically stable  PROCEDURE DETAILS: The patient developed recurrent ischemia in the PACU. Was taken back to the upper inner for reexploration. Interpreter room had shown some extravasation possible dissection of the posterior tibial takeoff. The patient had a Bennett anatomy with posterior tibial, and all prior to the intertibial and peroneal which arose from a common trunk. Decision was made over the medial aspect of the calf and knee below-knee popliteal artery posterior tibial and the common trunk in the perineal intertibial M. intertibial and peroneal arteries were all exposed. There was no pulse in the below-knee popliteal artery. SonoSite ultrasound was used to visualize the saphenous vein groin. The vein was very small to level of the distal thigh and proximal calf. The vein was of good caliber Groin. Separate incision was made for vein harvest. The saphenous vein was harvested from the saphenofemoral junction down to the proximal to mid thigh. Branch this level her small caliber. The prior incision above the knee was reopened and the patient did have a pulse up the below-knee popliteal artery where the prior thrombectomy of been done. A total was created from this site to the below-knee popliteal artery. The patient was given 7000 intravenous heparin. A fracture ablation  time the posterior tibial and the common trunk between the intertibial and peroneal arteries were occluded. The popliteal artery was opened longitudinally and there was some thrombus present. There did appear to be some dissection present as well. The arteriotomy was continued and onto the posterior tibial artery. There was no evidence of dissection in the posterior tibial artery or the common trunk between the anterior tibial and peroneal arteries. The popliteal artery was transected above the level of the posterior tibial artery takeoff. The proximal the popliteal artery was oversewn with high-fiber Prolene suture. There was no flow through this artery. The saphenous vein was brought to the field and was reversed and was spatulated and sewn at the junction of the posterior tibial artery with a total of the graft and the heel of the graft was placed on the popliteal artery above the level of the common trunk of the intertibial peroneal arteries. 3 Fogarty catheter was passed down the common trunk of the tubular anterior tibial peroneal arteries and also the posterior tibial and evidence of dissection or clot present. The anastomosis completed and this was flushed with heparinized saline through the vein graft. The vein graft was brought to the [place] total the knee up to the level of the above-knee popliteal artery. The artery was occluded proximal and distally and was reopened to the prior thrombectomy site. This arteriotomy was extended distally and the vein was spatulated and sewn end-to-side to the artery with a running 6-0 Prolene suture. Clamps were removed and excellent thrill was noted. Richard with saline. Hemostasis tablet cautery. Patient had excellent Doppler flow in the intertibial and peroneal arteries state signal in the posterior  tibial arteries.  The patient was given 50 mg of protamine to reverse heparin. Was closed with 2-0 Vicryl the fascia of the above-knee and below-knee popliteal  incisions. The subcutaneous tissue and subcuticular closure was with 3-0 Vicryl sutures. The patient to the recovery room where he had a palpable dorsalis pedis pulse   Curt Jews, M.D. 03/25/2014 9:30 PM

## 2014-03-25 NOTE — Progress Notes (Signed)
The patient had good Doppler flow in the operating room. Once in recovery he began complaining of some pain in his foot. No audible flow to the ankle. Does not have any more swelling in his calf to suggest hematoma formation. Will return to the below-knee popliteal exploration. Discussed with the patient who understands

## 2014-03-25 NOTE — Progress Notes (Signed)
  I have seen and examined the patient, and reviewed the daily progress note by Dahlia Bailiff, MS IV and discussed the care of the patient with him. Please see my progress note from 03/25/2014 for further details regarding assessment and plan.    Signed:  Otho Bellows, MD 03/25/2014, 5:12 PM

## 2014-03-25 NOTE — Anesthesia Postprocedure Evaluation (Signed)
Anesthesia Post Note  Patient: Timothy Palmer  Procedure(s) Performed: Procedure(s) (LRB): Left Femoral- Below Knee Popliteal Bypass Graft (Left)  Anesthesia type: general  Patient location: PACU  Post pain: Pain level controlled  Post assessment: Patient's Cardiovascular Status Stable  Last Vitals:  Filed Vitals:   03/25/14 2236  BP:   Pulse: 96  Temp:   Resp: 19    Post vital signs: Reviewed and stable  Level of consciousness: sedated  Complications: No apparent anesthesia complications

## 2014-03-25 NOTE — Consult Note (Signed)
Patient name: Timothy Palmer MRN: [id number] DOB: 1965/10/07 Sex: male   Referred by: Aundra Dubin  Reason for referral:  Chief Complaint  Patient presents with  . Shortness of Breath    HISTORY OF PRESENT ILLNESS: Patient is a 48 year old gentleman with a long history of end-stage renal disease on hemodialysis. Was admitted with heart failure mitral regurgitation probable endocarditis. We're asked to see him today do 2 left popliteal embolus and SMA branch vessel mycotic aneurysm. The patient reports that 4 days ago while mowing his yard he had severe sudden onset of pain behind his left knee and numbness in his knee and down into his foot. He rested for 30-45 minutes and had resolution of the numbness. He did walk reported severe pain with walking. He reports that he is had persistent knee pain but the numbness and pain in his foot has resolved. He has no prior history of claudication or peripheral vascular occlusive disease. He is being evaluated for possible cardiac valve replacement.  Past Medical History  Diagnosis Date  . Hypertension   . Peripheral vascular disease   . Pneumonia 2014  . ESRD (end stage renal disease) on dialysis     [place],Dialysis- T,Th,S  . Acute on chronic diastolic heart failure [id number]  . Severe mitral regurgitation 03/23/2014    by TEE  . 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 Positive blood culture 11/27/2013    MSSA  . Positive blood culture 02/24/2014    MSSA  . DVT of upper extremity (deep vein thrombosis) 03/04/2014    Right arm  . Knee pain, left 03/21/2014  . Acute combined systolic and diastolic congestive heart failure 03/21/2014  . Chronic diastolic congestive heart failure     Past Surgical History  Procedure Laterality Date  . Av fistula placement Left ?2005    forearm   . Shuntogram Left November 04, 2011  . Patch angioplasty Left 07/23/2013    Procedure: PATCH ANGIOPLASTY- LEFT RADIOCEPHALIC  ARTERIOVENOUS FISTULA;  Surgeon: Angelia Mould, MD;  Location: [facility];  Service: Vascular;  Laterality: Left;  . Tee without cardioversion N/A 03/24/2014    Procedure: TRANSESOPHAGEAL ECHOCARDIOGRAM (TEE);  Surgeon: Larey Dresser, MD;  Location[facility] ENDOSCOPY;  Service: Cardiovascular;  Laterality: N/A;    History   Social History  . Marital Status: Single    Spouse Name: N/A    Number of Children: N/A  . Years of Education: N/A   Occupational History  . Not on file.   Social History Main Topics  . Smoking status: Never Smoker   . Smokeless tobacco: Never Used  . Alcohol Use: Yes     Comment: 03/23/2014 "glass or 2 of gin twice/month"  . Drug Use: Yes    Special: Marijuana     Comment: last used 03/20/14  . Sexual Activity: No   Other Topics Concern  . Not on file   Social History Narrative  . No narrative on file    Family History  Problem Relation Age of Onset  . Diabetes Mother   . Hypertension Mother   . Hypertension Father   . Hypertension Sister   . Hypertension Brother     Allergies as of 03/22/2014  . (No Known Allergies)    No current facility-administered medications on file prior to encounter.   Current Outpatient Prescriptions on File Prior to Encounter  Medication Sig Dispense Refill  . cinacalcet (SENSIPAR) 60 MG tablet  Take 60 mg by mouth daily.      Marland Kitchen lisinopril (PRINIVIL,ZESTRIL) 20 MG tablet Take 20 mg by mouth daily.      Marland Kitchen oxyCODONE-acetaminophen (PERCOCET/ROXICET) 5-325 MG per tablet Take 1-2 tablets by mouth every 4 (four) hours as needed for severe pain.  20 tablet  0  . sevelamer carbonate (RENVELA) 800 MG tablet Take 800 mg by mouth 3 (three) times daily with meals.         REVIEW OF SYSTEMS:  Reviewed in his history and physical with nothing to add  PHYSICAL EXAMINATION:  General: The patient is a well-nourished male, in no acute distress. Vital signs are BP 150/83  Pulse 95  Temp(Src) 98 F (36.7 C) (Oral)  Resp 22   Ht 5\' 11"  (1.803 m)  Wt 219 lb 9.3 oz (99.6 kg)  BMI 30.64 kg/m2  SpO2 98% Pulmonary: There is a good air exchange . Some shortness of breath without exertion Abdomen: Soft and non-tender . Obese Musculoskeletal: There are no major deformities.  Does have pain to palpation behind his left knee Neurologic: No focal weakness or paresthesias are detected, Skin: There are no ulcer or rashes noted. Psychiatric: The patient has normal affect. Cardiovascular: Palpable femoral pulses. No palpable left popliteal pulse or pedal pulses. Left foot somewhat cool compared to right. 2+ dorsalis pedis pulse on the right   CT angiogram was reviewed. 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 No treatment required currently. He does have what appears to be embolus in the popliteal artery with some reconstitution of the tibial vessels. This does cause complete occlusion.   Impression and Plan:  Acute ischemia of his left leg related to probable mycotic embolus. Discussed this with patient. Recommend urgent surgery for revascularization. Explain that this will likely have involved a normal artery and hopefully can do simple embolectomy of this. Explained may require bypass around the popliteal artery. It is somewhat unusual that he is having more pain specifically at the knee rather than more distally. Discussed this at length with patient who understands. We'll notify cardiology of plans for urgent surgery.    Alayzia Pavlock [facility] Office: [phone]

## 2014-03-25 NOTE — Progress Notes (Signed)
   LOS: 3 days   Subjective: - still having hot flashes at same frequency - leg pain stable - breathing breathing objectively better s/p HD yesterday  Objective: BP 150/83  Pulse 105  Temp(Src) 98.5 F (36.9 C) (Oral)  Resp 22  Ht 5\' 11"  (1.803 m)  Wt 99.6 kg (219 lb 9.3 oz)  BMI 30.64 kg/m2  SpO2 98%  Intake/Output Summary (Last 24 hours) at 03/25/14 1519 Last data filed at 03/25/14 1505  Gross per 24 hour  Intake    300 ml  Output   1000 ml  Net   -700 ml    Physical Exam: GEN: no notable diaphoresis, awake and oriented, in mild distress EYES: EOMI CV: II/VI holosystolic murmur greatest at mid-clavicular line, tachycardic, S1/S2 no gallop PULM: Some fine crackles at R base but otherwise clear crackles ABD: soft, NT/ND, +BS EXT: No edema  Labs/Studies: I have reviewed labs and studies from last 24hrs per EMR.  TEE: The mitral valve showed a small vegetation (?partially calcified) on the posterior leaflet. There was severe mitral regurgitation originating from the posterior leaflet at the site of the vegetation, cannot localize perforation but cannot rule out.  Medications: I have reviewed the patient's current medications.  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, severe mitral regurgitation, and septic embolization Active Problems:   End stage renal disease   Hypertension   Acute bronchitis   T wave inversion in EKG   QT prolongation   Mitral valve vegetation   Acute combined systolic and diastolic congestive heart failure   Severe mitral regurgitation   Positive blood culture   Knee pain, left   Chronic diastolic congestive heart failure  Timothy Palmer h/o ESRD on dialysis with MSSA infective endocarditis of native MV c/b L knee mycotic emboli s/p embolectomy with vasc surg, planning for eventual MV repair vs replacement.   MSSA MV Infective Endocarditis c/b L popliteal emboli: Severe  MR, LE CTA revealing L pop emboli s/p embolectomy 9/18. Being followed by cards, ID, CT surgery, vascular surgery, ortho; appreciate all recommendations. MRI head negative for embolic events. MRI L knee complete, per ortho not a surgical issue. Completed orthopanthogram (dental service c/s) - ancef (9/17-)s/p vanc/cefepime (9/15-9/17)  - to OR with Dr. Donnetta Hutching (vasc surg) 9/18)  - L/RHC planned for Monday 9/21 with Dr. Aundra Dubin in prep for MV repair vs replacement with CT surgery   Hypertension:  - per cards- hydral 37.5 TID   Lisinopril 20 amlodipine 10  ERSD: On TTS dialysis. Reports dry wt 103 kg.   - ESA qTh; Illene Regulus, Sensipar  ERSD: On TTS dialysis. Reports dry wt 103 kg.  - Nephrology c/s; appreciate recs - ESA qTh; Renvela, Sensipar  Diet: Per RD, Provide Nepro Shake po BID  Dispo: Floor status  This is a Careers information officer Note.  The care of the patient was discussed with Dr. Eulas Post and the assessment and plan formulated with their assistance.  Please see their attached note for official documentation of the daily encounter.

## 2014-03-25 NOTE — Anesthesia Procedure Notes (Signed)
Procedure Name: Intubation Date/Time: 03/25/2014 2:13 PM Performed by: Maeola Harman Pre-anesthesia Checklist: Patient identified, Emergency Drugs available, Suction available, Patient being monitored and Timeout performed Patient Re-evaluated:Patient Re-evaluated prior to inductionOxygen Delivery Method: Circle system utilized Preoxygenation: Pre-oxygenation with 100% oxygen Intubation Type: IV induction and Rapid sequence Ventilation: Mask ventilation without difficulty Laryngoscope Size: Mac and 3 Grade View: Grade I Tube type: Oral Tube size: 7.5 mm Number of attempts: 1 Airway Equipment and Method: Stylet Placement Confirmation: ETT inserted through vocal cords under direct vision,  positive ETCO2 and breath sounds checked- equal and bilateral Secured at: 22 cm Tube secured with: Tape Dental Injury: Teeth and Oropharynx as per pre-operative assessment

## 2014-03-25 NOTE — Anesthesia Procedure Notes (Signed)
Procedure Name: Intubation Date/Time: 03/25/2014 5:20 PM Performed by: Izora Gala Pre-anesthesia Checklist: Patient identified, Emergency Drugs available, Suction available and Patient being monitored Patient Re-evaluated:Patient Re-evaluated prior to inductionOxygen Delivery Method: Circle system utilized Preoxygenation: Pre-oxygenation with 100% oxygen Intubation Type: IV induction Ventilation: Mask ventilation without difficulty Laryngoscope Size: Miller and 3 Grade View: Grade II Tube type: Oral Tube size: 7.5 mm Number of attempts: 1 Airway Equipment and Method: Stylet Placement Confirmation: ETT inserted through vocal cords under direct vision,  positive ETCO2 and breath sounds checked- equal and bilateral Secured at: 23 cm Tube secured with: Tape Dental Injury: Teeth and Oropharynx as per pre-operative assessment

## 2014-03-25 NOTE — Anesthesia Preprocedure Evaluation (Addendum)
Anesthesia Evaluation  Patient identified by MRN, date of birth, ID band Patient awake    Reviewed: Allergy & Precautions, H&P , NPO status , Patient's Chart, lab work & pertinent test results, reviewed documented beta blocker date and time   Airway       Dental  (+) Dental Advisory Given   Pulmonary          Cardiovascular hypertension, + Peripheral Vascular Disease, +CHF and DVT + Valvular Problems/Murmurs MR     Neuro/Psych    GI/Hepatic   Endo/Other  Morbid obesity  Renal/GU DialysisRenal disease     Musculoskeletal   Abdominal   Peds  Hematology   Anesthesia Other Findings Bacterial endocarditis  Reproductive/Obstetrics                        Anesthesia Physical Anesthesia Plan  ASA: IV  Anesthesia Plan: MAC and General   Post-op Pain Management:    Induction: Intravenous  Airway Management Planned: Oral ETT  Additional Equipment:   Intra-op Plan:   Post-operative Plan:   Informed Consent: I have reviewed the patients History and Physical, chart, labs and discussed the procedure including the risks, benefits and alternatives for the proposed anesthesia with the patient or authorized representative who has indicated his/her understanding and acceptance.     Plan Discussed with: CRNA, Anesthesiologist and Surgeon  Anesthesia Plan Comments:         Anesthesia Quick Evaluation

## 2014-03-25 NOTE — Progress Notes (Signed)
Report given to [place], pt transferred back to surgery via bed

## 2014-03-25 NOTE — Progress Notes (Signed)
The patient is in the OR holding area.  He is getting ready to have a revascularization procedure for his left leg for a mycotic embolus by Dr. Donnetta Hutching.complains of moderate medial left knee pain with when which may be related to the injury versus embolus. MRI of left knee showeda muscle injury posteriorly with edema in the biceps femoris, medial head gastrocnemius, and lateral head gastrocnemius.  There was an incidental finding of proximal patellar tendon partial tear with a small partial avulsion from the inferior pole of patella.  Small knee effusion with a small Baker cyst. Physical exam of left knee: Full range of motion.  He has posterior capsular tenderness.  No instability.  Minimal effusion today.  No significant warmth. Diagnosis: Muscle injury to left knee involving biceps femoris, medial head gastrocnemius, and lateral gastrocnemius. Plan: This is not a surgical problem.  It is safe for the patient to weight-bear on the left knee from an  orthopedic viewpoint.we do not feel that he has a septic knee.  We will sign off for now.  He will need an appointment with [place] in the office in approximately 2 weeks.  Please call if needed.  [phone].

## 2014-03-25 NOTE — Transfer of Care (Signed)
Immediate Anesthesia Transfer of Care Note  Patient: Timothy Palmer  Procedure(s) Performed: Procedure(s): Left Femoral- Below Knee Popliteal Bypass Graft (Left)  Patient Location: PACU  Anesthesia Type:General  Level of Consciousness: awake  Airway & Oxygen Therapy: Patient Spontanous Breathing and Patient connected to face mask oxygen  Post-op Assessment: Report given to PACU RN and Post -op Vital signs reviewed and stable  Post vital signs: Reviewed and stable  Complications: No apparent anesthesia complications

## 2014-03-25 NOTE — Anesthesia Preprocedure Evaluation (Signed)
Anesthesia Evaluation  Patient identified by MRN, date of birth, ID band Patient awake    Reviewed: Allergy & Precautions, H&P , NPO status , Patient's Chart, lab work & pertinent test results  Airway Mallampati: II  Neck ROM: full    Dental   Pulmonary          Cardiovascular hypertension, + Peripheral Vascular Disease and +CHF + Valvular Problems/Murmurs MR     Neuro/Psych    GI/Hepatic   Endo/Other    Renal/GU ESRF and DialysisRenal disease     Musculoskeletal   Abdominal   Peds  Hematology   Anesthesia Other Findings   Reproductive/Obstetrics                           Anesthesia Physical Anesthesia Plan  ASA: IV and emergent  Anesthesia Plan: General   Post-op Pain Management:    Induction: Intravenous  Airway Management Planned: Oral ETT  Additional Equipment:   Intra-op Plan:   Post-operative Plan: Extubation in OR  Informed Consent: I have reviewed the patients History and Physical, chart, labs and discussed the procedure including the risks, benefits and alternatives for the proposed anesthesia with the patient or authorized representative who has indicated his/her understanding and acceptance.     Plan Discussed with: CRNA, Anesthesiologist and Surgeon  Anesthesia Plan Comments:         Anesthesia Quick Evaluation

## 2014-03-25 NOTE — Transfer of Care (Signed)
Immediate Anesthesia Transfer of Care Note  Patient: Timothy Palmer  Procedure(s) Performed: Procedure(s): EMBOLECTOMY left popliteal (Left)  Patient Location: PACU  Anesthesia Type:General  Level of Consciousness: awake  Airway & Oxygen Therapy: Patient Spontanous Breathing and Patient connected to face mask oxygen  Post-op Assessment: Report given to PACU RN and Post -op Vital signs reviewed and stable  Post vital signs: Reviewed and stable  Complications: No apparent anesthesia complications

## 2014-03-25 NOTE — Progress Notes (Signed)
  Date: 03/25/2014  Patient name: Timothy Palmer  Medical record number: [id number]  Date of birth: 04-07-1966   This patient has been seen and the plan of care was discussed with the house staff. Please see their note for complete details. I concur with their findings with the following additions/corrections:  MSSA bacteremia - ABX narrowed to Ancef by ID. Repeat blood cxs negative  Mitral valve endocarditis with possible perf. CVTS eval pt and may need repair or replacement. To get R and L heart cath and dental eval first. Also MRI brain to R/O septic emboli there and CT angiogram ABD and pelvis to R/O septic emboli.   Acute ischemia of his left leg related to probable mycotic embolus. Vascular surgery to do revascularization today.  L knee pain - ortho did not appreciate effusion. MRI proximal patellar tendon partial tear with a small partially avulsion from the inferior pole of the patella. Ortho following.  ESRD - HD per renal.   Bartholomew Crews, MD 03/25/2014, 1:43 PM

## 2014-03-26 ENCOUNTER — Encounter (HOSPITAL_COMMUNITY): Payer: Self-pay | Admitting: Thoracic Surgery (Cardiothoracic Vascular Surgery)

## 2014-03-26 LAB — RESPIRATORY VIRUS PANEL
ADENOVIRUS: NOT DETECTED
Influenza A H1: NOT DETECTED
Influenza A H3: NOT DETECTED
Influenza A: NOT DETECTED
Influenza B: NOT DETECTED
Metapneumovirus: NOT DETECTED
PARAINFLUENZA 3 A: NOT DETECTED
Parainfluenza 1: NOT DETECTED
Parainfluenza 2: NOT DETECTED
RHINOVIRUS: NOT DETECTED
Respiratory Syncytial Virus A: NOT DETECTED
Respiratory Syncytial Virus B: NOT DETECTED

## 2014-03-26 LAB — CBC
HEMATOCRIT: 21.4 % — AB (ref 39.0–52.0)
HEMATOCRIT: 21.8 % — AB (ref 39.0–52.0)
HEMOGLOBIN: 7 g/dL — AB (ref 13.0–17.0)
HEMOGLOBIN: 7.2 g/dL — AB (ref 13.0–17.0)
MCH: 30.9 pg (ref 26.0–34.0)
MCH: 30.3 pg (ref 26.0–34.0)
MCHC: 33 g/dL (ref 30.0–36.0)
MCHC: 32.7 g/dL (ref 30.0–36.0)
MCV: 93.6 fL (ref 78.0–100.0)
MCV: 92.6 fL (ref 78.0–100.0)
Platelets: 231 10*3/uL (ref 150–400)
Platelets: 257 10*3/uL (ref 150–400)
RBC: 2.33 MIL/uL — AB (ref 4.22–5.81)
RBC: 2.31 MIL/uL — ABNORMAL LOW (ref 4.22–5.81)
RDW: 15.4 % (ref 11.5–15.5)
RDW: 15.4 % (ref 11.5–15.5)
WBC: 11.3 10*3/uL — AB (ref 4.0–10.5)
WBC: 12 10*3/uL — AB (ref 4.0–10.5)

## 2014-03-26 LAB — PREPARE RBC (CROSSMATCH)

## 2014-03-26 LAB — RENAL FUNCTION PANEL
ALBUMIN: 2.1 g/dL — AB (ref 3.5–5.2)
Albumin: 2.3 g/dL — ABNORMAL LOW (ref 3.5–5.2)
Anion gap: 16 — ABNORMAL HIGH (ref 5–15)
Anion gap: 16 — ABNORMAL HIGH (ref 5–15)
BUN: 34 mg/dL — AB (ref 6–23)
BUN: 36 mg/dL — AB (ref 6–23)
CHLORIDE: 96 meq/L (ref 96–112)
CO2: 25 mEq/L (ref 19–32)
CO2: 26 meq/L (ref 19–32)
CREATININE: 9.64 mg/dL — AB (ref 0.50–1.35)
Calcium: 10.3 mg/dL (ref 8.4–10.5)
Calcium: 10.2 mg/dL (ref 8.4–10.5)
Chloride: 97 mEq/L (ref 96–112)
Creatinine, Ser: 9.52 mg/dL — ABNORMAL HIGH (ref 0.50–1.35)
GFR calc Af Amer: 7 mL/min — ABNORMAL LOW (ref >90)
GFR calc Af Amer: 7 mL/min — ABNORMAL LOW (ref >90)
GFR calc non Af Amer: 6 mL/min — ABNORMAL LOW (ref >90)
GFR, EST NON AFRICAN AMERICAN: 6 mL/min — AB (ref >90)
GLUCOSE: 100 mg/dL — AB (ref 70–99)
GLUCOSE: 96 mg/dL (ref 70–99)
Phosphorus: 7.4 mg/dL — ABNORMAL HIGH (ref 2.3–4.6)
Phosphorus: 7.5 mg/dL — ABNORMAL HIGH (ref 2.3–4.6)
Potassium: 4.8 mEq/L (ref 3.7–5.3)
Potassium: 5.1 mEq/L (ref 3.7–5.3)
Sodium: 138 mEq/L (ref 137–147)
Sodium: 138 mEq/L (ref 137–147)

## 2014-03-26 LAB — ABO/RH: ABO/RH(D): B POS

## 2014-03-26 LAB — MRSA PCR SCREENING: MRSA by PCR: NEGATIVE

## 2014-03-26 MED ORDER — SODIUM CHLORIDE 0.9 % IV SOLN: Freq: Once | INTRAVENOUS | Status: DC

## 2014-03-26 NOTE — Progress Notes (Signed)
LOS: 4 days   Subjective: - OR yesterday with Dr. Donnetta Hutching for embolectomy, was c/b recurrent ischemia and foot pain, taken back to OR for upper to lower knee popliteal bypass - leg pain stable pulses back - reports less hot flashes recently  Objective: BP 111/73  Pulse 89  Temp(Src) 98.2 F (36.8 C) (Oral)  Resp 23  Ht 5\' 11"  (1.803 m)  Wt 100.2 kg (220 lb 14.4 oz)  BMI 30.82 kg/m2  SpO2 96%  Intake/Output Summary (Last 24 hours) at 03/26/14 0924 Last data filed at 03/26/14 0900  Gross per 24 hour  Intake   1675 ml  Output    150 ml  Net   1525 ml    Physical Exam: GEN: no notable diaphoresis, awake and oriented, in mild distress EYES: EOMI CV: II/VI holosystolic murmur greatest at mid-clavicular line, tachycardic, S1/S2 no gallop PULM: Some fine crackles at R base but otherwise clear crackles ABD: soft, NT/ND, +BS EXT: No edema, 2+ pulses bilaterally LE, much improved L sided lower extremity pulse  Labs/Studies: I have reviewed labs and studies from last 24hrs per EMR.  Medications: I have reviewed the patient's current medications.  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   MSSA MV Infective Endocarditis c/b L popliteal emboli: Severe MR, LE CTA revealing L pop emboli s/p embolectomy  9/18. Being followed by cards, ID, CT surgery, vascular surgery, ortho; appreciate all recommendations. MRI head negative for embolic events. MRI L knee complete, per ortho not a surgical issue. Completed orthopanthogram (dental service c/s) - ancef (9/17-)s/p vanc/cefepime (9/15-9/17)  - to OR with Dr. Donnetta Hutching (vasc surg) 9/18)  - L/RHC planned for Monday 9/21 with Dr. Aundra Dubin in prep for MV repair vs replacement with CT surgery   Anemia: Normocytic. Likely acute blood loss anemia on top of anemia of chronic kidney disease. Hb down to 7.2 post-operatively from 8.2 with a baseline around 11. Will hold off on transfusion during dialysis since pt is asymptomatic and does not have known active bleeding source other that known EBL of 22mL yesterday intraoperatively - will check PM CBC with dialysis to trend Hb - No iron studies in our system. Not on iron replacement currently. Cannot r/o underlying IDA, will check serum iron, TIBC, and ferritin  - continue aranesp 40 mcg IV every thusday dialysis  Hypertension:  - per cards- hydral 37.5 TID   Lisinopril 20 amlodipine 10  ERSD: On TTS dialysis. Reports dry wt 103 kg.   - ESA qTh; Illene Regulus, Sensipar  ERSD: On TTS dialysis. Reports dry wt 103 kg.  - Nephrology c/s; appreciate recs - ESA qTh; Renvela, Sensipar  Diet: Per RD, Provide Nepro Shake po BID  Dispo: Floor status  This is a Careers information officer Note.  The care of the patient was discussed with Dr. Eulas Post  and the assessment and plan formulated with their assistance.  Please see their attached note for official documentation of the daily encounter.

## 2014-03-26 NOTE — ED Provider Notes (Signed)
Medical screening examination/treatment/procedure(s) were conducted as a shared visit with non-physician practitioner(s) and myself.  I personally evaluated the patient during the encounter.   EKG Interpretation   Date/Time:  Tuesday March 22 2014 13:40:12 EDT Ventricular Rate:  109 PR Interval:  135 QRS Duration: 96 QT Interval:  452 QTC Calculation: 609 R Axis:   -156 Text Interpretation:  Sinus tachycardia Biatrial enlargement Right  ventricular hypertrophy Abnormal T, consider ischemia, diffuse leads  Prolonged QT interval ED PHYSICIAN INTERPRETATION AVAILABLE IN [facility] Confirmed by TEST, Record ([id number]) on 03/24/2014 6:52:44 AM       Patient with dyspnea and hypoxia. No PE, but likely has PNA given CT findings and exam. EKG shows abnormal ST/T's that are felt to be related to likely electrolyte issues per Dr. Acie Fredrickson. No chest pain or anginal symptoms. Admit.  Ephraim Hamburger, MD 03/26/14 [phone]

## 2014-03-26 NOTE — Procedures (Signed)
Patient was seen on dialysis and the procedure was supervised. BFR 400 Via LUE AVF BP is 133/80.  Patient appears to be tolerating treatment well.  Had to undergo 2 procedures yesterday due to occlusion of the new fem-pop bypass graft.

## 2014-03-26 NOTE — Progress Notes (Signed)
Pt received two units of PRBCs; no s/s of a rxn noted denied itching, dizziness or back pain

## 2014-03-26 NOTE — Progress Notes (Signed)
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 Donnetta Hutching last night.  Patient denies any chest pain or shortness of breath.  Heart rate is normal sinus rhythm.  Blood pressure is satisfactory.  CURRENT MEDS . amLODipine  10 mg Oral Daily  .  ceFAZolin (ANCEF) IV  2 g Intravenous Q T,Th,Sat-1800  . chlorhexidine  15 mL Mouth/Throat BID  . cinacalcet  60 mg Oral QPC supper  . darbepoetin (ARANESP) injection - DIALYSIS  40 mcg Intravenous Q Thu-HD  . feeding supplement (NEPRO CARB STEADY)  237 mL Oral BID BM  . heparin  5,000 Units Subcutaneous 3 times per day  . hydrALAZINE  37.5 mg Oral 3 times per day  . lisinopril  20 mg Oral Daily  . sevelamer carbonate  800 mg Oral TID WC  . sodium chloride  3 mL Intravenous Q12H  . sodium chloride  3 mL Intravenous Q12H    OBJECTIVE  Filed Vitals:   03/26/14 0000 03/26/14 0434 03/26/14 0719 03/26/14 1136  BP:  124/79 111/73 133/80  Pulse: 97 98 89 97  Temp:  98.5 F (36.9 C) 98.2 F (36.8 C) 97.6 F (36.4 C)  TempSrc:  Oral Oral Oral  Resp: 29 29 23  32  Height:      Weight:      SpO2: 96% 99% 96% 95%    Intake/Output Summary (Last 24 hours) at 03/26/14 1156 Last data filed at 03/26/14 0900  Gross per 24 hour  Intake   1915 ml  Output    150 ml  Net   1765 ml   Filed  Weights   03/24/14 1745 03/24/14 2003 03/25/14 2243  Weight: 219 lb 9.3 oz (99.6 kg) 219 lb 9.3 oz (99.6 kg) 220 lb 14.4 oz (100.2 kg)    PHYSICAL EXAM  General: Pleasant, NAD. Neuro: Alert and oriented X 3. Moves all extremities spontaneously. Psych: Normal affect. HEENT:  Normal  Neck: Supple without bruits or JVD. Lungs:  Resp regular and unlabored, CTA. Heart: RRR no s3, s4, and there is a grade 3/6 holosystolic murmur audible at the lower left sternal edge and apex. Abdomen: Soft, non-tender, non-distended, BS + x 4.  Extremities: No clubbing, cyanosis or edema. DP/PT/Radials 2+ and equal bilaterally.  There is a good dorsalis pedis pulse in the left foot and of the foot is warm.  Accessory Clinical Findings  CBC  Recent Labs  03/24/14 1300 03/26/14 0050  WBC 13.5* 11.3*  HGB 8.2* 7.2*  HCT 24.5* 21.8*  MCV 91.4 93.6  PLT 224 [id number]   Basic Metabolic Panel  Recent Labs  03/26/14 0050 03/26/14 0340  NA 138 138  K 4.8 5.1  CL 96 97  CO2 26 25  GLUCOSE 100* 96  BUN 34* 36*  CREATININE 9.52* 9.64*  CALCIUM  10.3 10.2  PHOS 7.4* 7.5*   Liver Function Tests  Recent Labs  03/26/14 0050 03/26/14 0340  ALBUMIN 2.3* 2.1*   No results found for this basename: LIPASE, AMYLASE,  in the last 72 hours Cardiac Enzymes No results found for this basename: CKTOTAL, CKMB, CKMBINDEX, TROPONINI,  in the last 72 hours BNP No components found with this basename: POCBNP,  D-Dimer No results found for this basename: DDIMER,  in the last 72 hours Hemoglobin A1C No results found for this basename: HGBA1C,  in the last 72 hours Fasting Lipid Panel No results found for this basename: CHOL, HDL, LDLCALC, TRIG, CHOLHDL, LDLDIRECT,  in the last 72 hours Thyroid Function Tests No results found for this basename: TSH, T4TOTAL, FREET3, T3FREE, THYROIDAB,  in the last 72 hours  TELE  Normal sinus rhythm  ECG    Radiology/Studies  Dg Orthopantogram  03/25/2014   CLINICAL  DATA:  Fever.  EXAM: ORTHOPANTOGRAM/PANORAMIC  COMPARISON:  None.  FINDINGS: Poor dentition is noted. No lytic destruction is seen to suggest osteomyelitis or bony abscess.  IMPRESSION: No definite evidence of osteomyelitis or bony abscess seen in the mandible.   Electronically Signed   By: Sabino Dick M.D.   On: 03/25/2014 09:06   Dg Chest 2 View  03/25/2014   CLINICAL DATA:  Fever  EXAM: CHEST  2 VIEW  COMPARISON:  03/23/2014  FINDINGS: Heart size is mildly enlarged with persistent central vascular congestion. Trace pleural effusions are slightly larger than previously. No focal lobar consolidation. No pneumothorax. No acute osseous finding.  IMPRESSION: Cardiomegaly with central vascular congestion and trace effusions.   Electronically Signed   By: Conchita Paris M.D.   On: 03/25/2014 09:03   Dg Chest 2 View  03/23/2014   CLINICAL DATA:  Weakness, leg pain  EXAM: CHEST  2 VIEW  COMPARISON:  03/22/2014  FINDINGS: Borderline cardiomegaly. Small right pleural effusion with right basilar atelectasis. Central mild vascular congestion and mild perihilar interstitial prominence suspicious for mild interstitial edema. Bony thorax is unremarkable.  IMPRESSION: Small right pleural effusion with right basilar atelectasis. Central mild vascular congestion and mild perihilar interstitial prominence suspicious for mild interstitial edema. No segmental infiltrate.   Electronically Signed   By: Lahoma Crocker M.D.   On: 03/23/2014 09:04   Dg Chest 2 View  03/22/2014   CLINICAL DATA:  Shortness of breath  EXAM: CHEST  2 VIEW  COMPARISON:  07/23/2013  FINDINGS: There is no focal parenchymal opacity, pleural effusion, or pneumothorax. Mild pulmonary vascular congestion. There is mild cardiomegaly.  The osseous structures are unremarkable.  IMPRESSION: Mild pulmonary vascular congestion. Otherwise no active cardiopulmonary disease.   Electronically Signed   By: Kathreen Devoid   On: 03/22/2014 12:14   Ct Angio Chest W/cm &/or  Wo Cm  03/22/2014   CLINICAL DATA:  Short of breath.  Cough.  EXAM: CT ANGIOGRAPHY CHEST WITH CONTRAST  TECHNIQUE: Multidetector CT imaging of the chest was performed using the standard protocol during bolus administration of intravenous contrast. Multiplanar CT image reconstructions and MIPs were obtained to evaluate the vascular anatomy.  CONTRAST:  59mL OMNIPAQUE IOHEXOL 350 MG/ML SOLN  COMPARISON:  09/12/2012  FINDINGS: No evidence of a pulmonary embolus.  Heart is mildly enlarged. There are minor coronary artery calcifications. Great vessels are normal in caliber.  There is mediastinal adenopathy. Multiple reference measurements were made. There is a 14 mm short axis node in the right superior mediastinum. There is a 19 mm short  axis prevascular lymph node adjacent the left pulmonary artery. There is a 15 mm right subcarinal lymph node. There are several other prominent to mildly enlarged lymph nodes. Although there were prominent nodes previously, the larger nodes have increased in size. There are prominent hilar lymph nodes not well defined, and without change.  Small right pleural effusion.  No left effusion.  Stable calcified pleural plaque over the right hemidiaphragm.  Lungs show mild diffuse interstitial thickening and hazy and somewhat heterogeneous airspace opacities which have a central to upper lobe predominance. Interstitial thickening is greatest in the lung bases.  Limited evaluation of the upper abdomen shows gallstones but is otherwise unremarkable.  No osteoblastic or osteolytic lesions.  Review of the MIP images confirms the above findings.  IMPRESSION: 1. No evidence of a pulmonary embolism. 2. Mild cardiomegaly, interstitial thickening and hazy areas of airspace lung opacity, as well as a small right effusion, all suggests congestive heart failure. However, there also prominent and mildly enlarged mediastinal lymph nodes. These are likely reactive. The possibility of lung infection should  be considered in the proper clinical setting.   Electronically Signed   By: Lajean Manes M.D.   On: 03/22/2014 13:09   Mr Brain Wo Contrast  03/25/2014   CLINICAL DATA:  Fever, chills, and dyspnea. Endocarditis. Evaluate for embolic stroke.  EXAM: MRI HEAD WITHOUT CONTRAST  TECHNIQUE: Multiplanar, multiecho pulse sequences of the brain and surrounding structures were obtained without intravenous contrast.  COMPARISON:  None.  FINDINGS: There is no evidence of acute infarct, intracranial hemorrhage, mass, midline shift, or extra-axial fluid collection. Ventricles and sulci are within normal limits for age. A few punctate foci of T2 hyperintensity in the white matter of the frontal lobes are nonspecific and likely within normal limits for age.  Orbits are unremarkable. Paranasal sinuses and mastoid air cells are clear. Major intracranial vascular flow voids are preserved.  IMPRESSION: No acute infarct.  Unremarkable appearance of the brain for age.   Electronically Signed   By: Logan Bores   On: 03/25/2014 10:20   Ct Angio Ao+bifem W/cm &/or Wo/cm  03/25/2014   ADDENDUM REPORT: 03/25/2014 08:50  ADDENDUM: The following should been included in the impression:  Inflammatory change in the subcutaneous fat overlying the deep fascia of the left vastus lateralis muscle.  These result of the entire exam were called by telephone at the time of interpretation on 03/25/2014 at 8:47 am to Dr. Loralie Champagne , who verbally acknowledged these results.   Electronically Signed   By: Jacqulynn Cadet M.D.   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 Evaluate for aortoiliac embolic disease or evidence of septic arthritis / septic embolization.  EXAM: CT ANGIOGRAPHY OF ABDOMINAL AORTA WITH ILIOFEMORAL RUNOFF  TECHNIQUE: Multidetector CT imaging of the abdomen, pelvis and lower extremities was performed using the standard protocol during bolus  administration of intravenous contrast. Multiplanar CT image reconstructions and MIPs were obtained to evaluate the vascular anatomy.  CONTRAST:  179mL OMNIPAQUE IOHEXOL 350 MG/ML SOLN  COMPARISON:  CT chest 03/22/2014 ; radiographs of the left knee 03/23/2014  FINDINGS: VASCULAR  Aorta: Normal caliber aorta. No significant atherosclerotic plaque or irregular wall thickening. No inflammation common dissection or aneurysmal dilatation.  Celiac: Widely patent. Conventional hepatic arterial anatomy. No evidence of mycotic or other visceral artery aneurysm.  SMA: Widely patent. There is a focal fusiform aneurysm with mild hazy arterial  wall thickening arising from a distal ileal branch of the SMA beyond the ileocecal branches to the right of midline in the mid to lower abdomen. Aneurysm measures 1.4 x 1.0 cm in diameter which is significantly larger than the 4.4 mm parent vessel.  Renals: Abhorrent right renal artery anatomy. There is a small retrocaval accessory renal artery to the upper pole. The dominant renal artery originates low on the aorta beyond the origin of the IMA. On the left, there is a single dominant renal artery and typical anatomic position. Mild focal heterogeneous atherosclerotic plaque versus focal changes of fibromuscular dysplasia results in at least moderate stenosis of the proximal left renal artery. Evaluation is slightly limited by focal motion artifact. The 2 right-sided renal arteries appear patent.  IMA: Widely patent and unremarkable.  RIGHT Lower Extremity  Inflow: Widely patent and unremarkable comment, internal and external iliac arteries. Trace plaque along the distal common iliac artery without stenosis.  Outflow: The common, profunda wall and superficial femoral arteries are relatively disease free and widely patent. No evidence of pathology. Calcified atherosclerotic plaque is noted in the above the knee popliteal artery resulting in moderate focal stenosis. Remainder the popliteal  artery is widely patent.  Runoff: Mild multifocal narrowing of the posterior tibial artery. Three vessel runoff patent to the ankle.  LEFT lower Extremity  Inflow: Widely patent common, internal and external iliac arteries.  Outflow: The common, profunda and superficial femoral arteries are widely patent. There is trace atherosclerotic plaque in the superficial femoral artery distribution without significant stenosis. The entire 5.5 cm length of the left popliteal artery is abnormal. The vessel is nearly occluded proximally with a linear defects which can best be described as a focal dissection. There is irregular inflammatory stranding surrounding the vessel involving the lateral head of the gastrocnemius muscle in the intracompartmental fat within the upper calf. The arterial wall is thickened and poorly defined. The mid segment of the vessel at the level of the knee is diffusely irregular and heterogeneous with intermittent contrast opacification in the lumen. There is a short segment complete occlusion secondary to intraluminal filling defect which then extends distally as a semi occlusive thrombus to the level of the tibial plateau. Below the knee, the popliteal artery regains patency just proximal to the trifurcation.  Runoff: Slightly aberrant trifurcation anatomy. The posterior tibial artery arises proximally hand and there is a anterior tibial-peroneal trunk. The runoff vessels remain patent to the ankle.  Veins: No focal venous abnormality.  Review of the MIP images confirms the above findings.  NON-VASCULAR  Lower Chest: Incidentally noted gynecomastia. Small right pleural effusion. Calcified right pleural plaque. Combination dependent atelectasis and patchy consolidation in the lower lobes. Trace atherosclerotic calcifications in the coronary arteries. Borderline cardiomegaly. No pericardial effusion. Unremarkable thoracic esophagus.  Abdomen: Unremarkable appearance of the stomach, duodenum, and adrenal  glands. There is an intermediate attenuation 4.1 x 4.1 by 2.6 cm lesion in the superior pole of the spleen concerning for possible septic abscess. Normal hepatic contour and configuration. No discrete hepatic lesion. Multiple small stones within the gallbladder lumen and neck. No secondary signs to suggest acute cholecystitis. The portal veins remain patent.  The bilateral kidneys are nearly entirely replaced with cysts. The kidneys are also slightly enlarged. No definite enhancing renal lesion. Evaluation is limited by mild motion artifact. Colonic diverticular disease without CT evidence of active inflammation. No evidence of obstruction or focal bowel wall thickening. Normal appendix in the right lower quadrant. The terminal ileum  is unremarkable. No free fluid or suspicious adenopathy. There is a 1.1 cm peripherally calcified structure within the small bowel mesenteric left of midline which measures 1.1 cm in diameter (image 105 series 5). This structure abuts the mesenteric border of the adjacent small bowel. No surrounding desmoplastic change or inflammation.  Pelvis: Abnormal left external iliac station lymph nodes. Although not enlarged by CT criteria (7 and 5 mm in short axis) there is interstitial inflammatory stranding in the perinodal fat. These are concerning for reactive adenopathy versus adenitis given the patient's known clinical history. No necrosis or abscess. Under distended but diffusely thick-walled bladder. Unremarkable prostate and seminal vesicles. Calcification of the bilateral vas deferens. No free fluid.  Bones/Soft Tissues: No acute fracture or aggressive appearing lytic or blastic osseous lesion. Transitional anatomy with sacralization of L5. Focal degenerative disc disease at L4-L5 with vacuum disc phenomenon. Localized inflammatory change of the deeper muscular fascia overlying the vastus lateralis muscle in the left thigh.  IMPRESSION: VASCULAR  1. Diffusely abnormal left popliteal  artery. The findings most likely represent a combination of septic embolus, mycotic arteritis and dissection. The entire 5.5 cm length of the left popliteal artery is abnormal with dissection proximally in the above the knee popliteal artery, focal complete complete occlusion at the level of the joint and partially occluding septic embolus extending to the tibial plateau. There is surrounding inflammatory change involving the deep intermuscular fat of the upper calf and the lateral head of the gastrocnemius muscle. 2. 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 The aneurysm measures 1.4 x 1.1 cm which is significantly larger than the 4.5 mm parent vessel. No evidence of downstream occlusion or small bowel ischemia. 3. Aberrant left trifurcation anatomy. The posterior tibial artery arises proximally bleeding in anterior tibial - peroneal trunk which then bifurcates. Three vessel runoff remains patent to the ankle. 4. Focal moderate stenosis of the proximal left renal artery. Secondary to motion artifact is unclear if this represents focal fibro fatty atherosclerotic plaque or focal changes of FMD. 5. Aberrant right renal artery anatomy as described above without stenosis or FMD. NON VASCULAR  1. Probable 4.1 cm septic abscess in the upper pole of the spleen. Additional differential considerations include splenic hemangioma, hammertoe mal or complex cyst although these are all considered significantly less likely given the patient's overall clinical picture. 2. Left external iliac station adenitis, likely reactive and related to the patient's multifocal septic emboli. 3. Bilateral renal enlargement with innumerable cysts of varying complexity. Differential considerations include dialysis associated acquired cystic kidney disease and autosomal dominant polycystic kidney disease. The former is favored. 4. 1.1 cm peripherally calcified structure within the small bowel mesenteric  fat adjacent to a loop of distal jejunum/ileum left of midline without evidence of surrounding desmoplastic change. This finding is nonspecific and may represent sequelae of old granulomatous disease, a peripherally calcified duplication cyst, or the residua of prior fat necrosis. Carcinoid or desmoid tumor is considered significantly less likely given the complete absence of surrounding desmoplastic change. Recommend followup CT scan of the abdomen and pelvis in 1 year to confirm stability. 5. Under distended but diffusely thick-walled bladder. Recommend clinical correlation for signs and symptoms of cystitis. 6. Cholelithiasis without secondary signs to suggest acute cholecystitis. 7. Scattered colonic diverticula without evidence of active diverticulitis. 8. Transitional L5 anatomy with associated focal L4-L5 degenerative disc disease.  Signed,  Criselda Peaches, MD  [facility]  [facility]  Electronically Signed: By: Jacqulynn Cadet M.D. On: 03/25/2014 08:35   Mr Knee Left  Wo Contrast  03/25/2014   CLINICAL DATA:  Popping injury left knee during yardwork. Numbness in the knee. Pain.  EXAM: MRI OF THE LEFT KNEE WITHOUT CONTRAST  TECHNIQUE: Multiplanar, multisequence MR imaging of the knee was performed. No intravenous contrast was administered.  COMPARISON:  None.  FINDINGS: Despite efforts by the technologist and patient, motion artifact is present on today's exam and could not be eliminated. This reduces exam sensitivity and specificity.  MENISCI  Medial meniscus:  Intact  Lateral meniscus:  Intact  LIGAMENTS  Cruciates:  Intact  Collaterals:  Intact  CARTILAGE  Patellofemoral:  Intact  Medial:  Intact  Lateral:  Intact  Joint:  Small knee effusion.  Popliteal Fossa: Abnormal infiltrative edema in the popliteal fossa abnormal edema in the distal biceps femorals muscle. Plantaris tendon seems intact. 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 The medial head gastrocnemius is also edematous. Small Baker's cyst. Proximal soleus muscle intact.  Extensor Mechanism: Proximal patellar partial tear potentially with a small partial avulsion from the inferior pole of the patella.  Bones:  Intact except as noted above.  IMPRESSION: 1. Muscle injury posteriorly with abnormal edema in the biceps femoris, medial head gastrocnemius, and lateral head gastrocnemius. 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 2. Proximal patellar tendon partial tear with a small partially avulsion from the inferior pole of the patella. 3. Small knee effusion.  Small Baker's cyst.   Electronically Signed   By: Sherryl Barters M.D.   On: 03/25/2014 10:45   Dg Ang/ext/uni/or Left  03/25/2014   CLINICAL DATA:  48 year old male with end-stage renal disease. Left leg ischemia.  EXAM: Intraoperative angiogram  COMPARISON:  None.  FINDINGS: Single arteriogram image of intraoperative left lower extremity angiogram submitted.  Tip of infusion catheter via surgical cutdown just above the popliteal artery, potentially the distal aspect of a graft.  Extravasation of contrast from the tibioperoneal trunk, or potentially the posterior tibial artery with no contrast flowing within this artery distally.  The anterior tibial artery is patent to the inferior margin of the exam.  IMPRESSION: Single intraoperative angiographic image of the left lower extremity demonstrates extravasation of contrast from what may be tibioperoneal trunk or posterior tibial artery.  The anterior tibial artery is patent to the inferior margin of the exam.  Signed,  Dulcy Fanny. Earleen Newport, DO  [facility]  [facility]   Electronically Signed   By: Corrie Mckusick O.D.   On: 03/25/2014 17:59   Dg Knee Ap/lat W/sunrise Left  03/23/2014   CLINICAL  DATA:  Left knee pain and swelling for 3 days  EXAM: DG KNEE - 3 VIEWS  COMPARISON:  None.  FINDINGS: There is no evidence of fracture, dislocation, or joint effusion. There is no evidence of arthropathy or other focal bone abnormality. Soft tissues are unremarkable. Transverse patellar cleft incidentally noted. Vascular calcifications are noted.  IMPRESSION: Negative.   Electronically Signed   By: Conchita Paris M.D.   On: 03/23/2014 21:17    ASSESSMENT AND PLAN 1. suspected mitral valve endocarditis with septic embolus to left leg 2. mitral valve vegetation on the posterior mitral valve leaflet with severe mitral regurgitation by TEE 3. end-stage renal disease 4. passed out heart failure in the setting of severe mitral regurgitation,  improving  Plan: The patient will undergo right and left heart cardiac catheterization Monday by Dr. Aundra Dubin.  Continue current meds.   Signed, Darlin Coco MD

## 2014-03-26 NOTE — Progress Notes (Signed)
Subjective:  Comfortable, left knee pain resolved, also breathing better  Objective: Vital signs in last 24 hours: Temp:  [97.6 F (36.4 C)-99.2 F (37.3 C)] 97.6 F (36.4 C) (09/19 1136) Pulse Rate:  [89-115] 97 (09/19 1136) Resp:  [19-36] 25 (09/19 1136) BP: (111-182)/(58-89) 133/80 mmHg (09/19 1136) SpO2:  [86 %-100 %] 95 % (09/19 1136) Weight:  [100.2 kg (220 lb 14.4 oz)] 100.2 kg (220 lb 14.4 oz) (09/18 2243) Weight change: -1.7 kg (-3 lb 12 oz)  Intake/Output from previous day: 09/18 0701 - 09/19 0700 In: 1645 [P.O.:240; I.V.:1155; IV Piggyback:250] Out: 150 [Blood:150] Intake/Output this shift: Total I/O In: 515 [P.O.:440; I.V.:75] Out: -   Lab Results:  Recent Labs  03/24/14 1300 03/26/14 0050  WBC 13.5* 11.3*  HGB 8.2* 7.2*  HCT 24.5* 21.8*  PLT 224 231   BMET:  Recent Labs  03/26/14 0050 03/26/14 0340  NA 138 138  K 4.8 5.1  CL 96 97  CO2 26 25  GLUCOSE 100* 96  BUN 34* 36*  CREATININE 9.52* 9.64*  CALCIUM 10.3 10.2  ALBUMIN 2.3* 2.1*   No results found for this basename: PTH,  in the last 72 hours Iron Studies: No results found for this basename: IRON, TIBC, TRANSFERRIN, FERRITIN,  in the last 72 hours  Studies/Results: Dg Orthopantogram  03/25/2014   CLINICAL DATA:  Fever.  EXAM: ORTHOPANTOGRAM/PANORAMIC  COMPARISON:  None.  FINDINGS: Poor dentition is noted. No lytic destruction is seen to suggest osteomyelitis or bony abscess.  IMPRESSION: No definite evidence of osteomyelitis or bony abscess seen in the mandible.   Electronically Signed   By: Sabino Dick M.D.   On: 03/25/2014 09:06   Dg Chest 2 View  03/25/2014   CLINICAL DATA:  Fever  EXAM: CHEST  2 VIEW  COMPARISON:  03/23/2014  FINDINGS: Heart size is mildly enlarged with persistent central vascular congestion. Trace pleural effusions are slightly larger than previously. No focal lobar consolidation. No pneumothorax. No acute osseous finding.  IMPRESSION: Cardiomegaly with central vascular  congestion and trace effusions.   Electronically Signed   By: Conchita Paris M.D.   On: 03/25/2014 09:03   Mr Brain Wo Contrast  03/25/2014   CLINICAL DATA:  Fever, chills, and dyspnea. Endocarditis. Evaluate for embolic stroke.  EXAM: MRI HEAD WITHOUT CONTRAST  TECHNIQUE: Multiplanar, multiecho pulse sequences of the brain and surrounding structures were obtained without intravenous contrast.  COMPARISON:  None.  FINDINGS: There is no evidence of acute infarct, intracranial hemorrhage, mass, midline shift, or extra-axial fluid collection. Ventricles and sulci are within normal limits for age. A few punctate foci of T2 hyperintensity in the white matter of the frontal lobes are nonspecific and likely within normal limits for age.  Orbits are unremarkable. Paranasal sinuses and mastoid air cells are clear. Major intracranial vascular flow voids are preserved.  IMPRESSION: No acute infarct.  Unremarkable appearance of the brain for age.   Electronically Signed   By: Logan Bores   On: 03/25/2014 10:20   Mr Knee Left  Wo Contrast  03/25/2014   CLINICAL DATA:  Popping injury left knee during yardwork. Numbness in the knee. Pain.  EXAM: MRI OF THE LEFT KNEE WITHOUT CONTRAST  TECHNIQUE: Multiplanar, multisequence MR imaging of the knee was performed. No intravenous contrast was administered.  COMPARISON:  None.  FINDINGS: Despite efforts by the technologist and patient, motion artifact is present on today's exam and could not be eliminated. This reduces exam sensitivity and specificity.  MENISCI  Medial meniscus:  Intact  Lateral meniscus:  Intact  LIGAMENTS  Cruciates:  Intact  Collaterals:  Intact  CARTILAGE  Patellofemoral:  Intact  Medial:  Intact  Lateral:  Intact  Joint:  Small knee effusion.  Popliteal Fossa: Abnormal infiltrative edema in the popliteal fossa abnormal edema in the distal biceps femorals muscle. Plantaris tendon seems intact. 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 The medial head gastrocnemius is also edematous. Small Baker's cyst. Proximal soleus muscle intact.  Extensor Mechanism: Proximal patellar partial tear potentially with a small partial avulsion from the inferior pole of the patella.  Bones:  Intact except as noted above.  IMPRESSION: 1. Muscle injury posteriorly with abnormal edema in the biceps femoris, medial head gastrocnemius, and lateral head gastrocnemius. 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 2. Proximal patellar tendon partial tear with a small partially avulsion from the inferior pole of the patella. 3. Small knee effusion.  Small Baker's cyst.   Electronically Signed   By: Sherryl Barters M.D.   On: 03/25/2014 10:45   Dg Ang/ext/uni/or Left  03/25/2014   CLINICAL DATA:  48 year old male with end-stage renal disease. Left leg ischemia.  EXAM: Intraoperative angiogram  COMPARISON:  None.  FINDINGS: Single arteriogram image of intraoperative left lower extremity angiogram submitted.  Tip of infusion catheter via surgical cutdown just above the popliteal artery, potentially the distal aspect of a graft.  Extravasation of contrast from the tibioperoneal trunk, or potentially the posterior tibial artery with no contrast flowing within this artery distally.  The anterior tibial artery is patent to the inferior margin of the exam.  IMPRESSION: Single intraoperative angiographic image of the left lower extremity demonstrates extravasation of contrast from what may be tibioperoneal trunk or posterior tibial artery.  The anterior tibial artery is patent to the inferior margin of the exam.  Signed,  Dulcy Fanny. Earleen Newport, DO  [facility]  [facility]   Electronically Signed   By: Corrie Mckusick O.D.   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 Roxy Manns pending.  2. 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 Donnetta Hutching.  3. MSSA baceremia - + BCs since 11/2013, Ancef 2g through 10/20.  4. ESRD - HD on T[facility], K 5.1. HD pending today.  5. HTN/Volume - BP 133/80 on Amlodipine 10 mg qd, Hydralazine 37.5 mg q8h, Lisinopril 20 mg qd; wt 100.2 kg, below EDW.  6. Anemia - Hgb down to 7.2 post-surgery, Aranesp 40 mcg on Thurs.  Transfuse 2 U per HD. 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  8. Nutrition - Alb 2.1, renal diet, multivitamin, supplement.     LOS: 4 days   LYLES,CHARLES 03/26/2014,12:22 PM   I have seen and examined this patient and agree with plan as outlined by C. Lyles, PA-C.  Feels better but a little soreness at surgical site. Chanetta Moosman A,MD 03/26/2014 1:36 PM

## 2014-03-26 NOTE — Progress Notes (Signed)
Subjective: Pt is s/p /p L femoral-popliteal bypass graft yesterday by Dr. Donnetta Hutching. He has been doing well since the surgery, and has not had any more fevers or diaphoresis.   Objective: Vital signs in last 24 hours: Filed Vitals:   03/26/14 0000 03/26/14 0434 03/26/14 0719 03/26/14 1136  BP:  124/79 111/73 133/80  Pulse: 97 98 89 97  Temp:  98.5 F (36.9 C) 98.2 F (36.8 C) 97.6 F (36.4 C)  TempSrc:  Oral Oral Oral  Resp: 29 29 23 25   Height:      Weight:      SpO2: 96% 99% 96% 95%   Weight change: -3 lb 12 oz (-1.7 kg)  Intake/Output Summary (Last 24 hours) at 03/26/14 1424 Last data filed at 03/26/14 1200  Gross per 24 hour  Intake   2160 ml  Output    150 ml  Net   2010 ml   Vitals reviewed. General: Lying up in bed  HEENT: EOMI Cardiac: RRR, 3/6 holosystolic murmur at the apex Pulm: Clear to auscultation bilaterally, no wheezes, rales, or rhonchi Abd: Soft, nontender, nondistended, BS present Ext: Warm and well perfused. 2+ DP pulse in right foot, 1+ DP pulse in left foot.  Neuro: Alert and oriented X3, moves all 4 extremities, nonfocal   Lab Results: Basic Metabolic Panel:  Recent Labs Lab 03/22/14 1142 03/22/14 1349  03/26/14 0050 03/26/14 0340  NA 140  --   < > 138 138  K 3.6*  --   < > 4.8 5.1  CL 94*  --   < > 96 97  CO2 33*  --   < > 26 25  GLUCOSE 105*  --   < > 100* 96  BUN 12  --   < > 34* 36*  CREATININE 5.85*  --   < > 9.52* 9.64*  CALCIUM 9.8  --   < > 10.3 10.2  MG  --  1.8  --   --   --   PHOS  --   --   < > 7.4* 7.5*  < > = values in this interval not displayed. CBC:  Recent Labs Lab 03/24/14 1300 03/26/14 0050  WBC 13.5* 11.3*  HGB 8.2* 7.2*  HCT 24.5* 21.8*  MCV 91.4 93.6  PLT 224 231   Cardiac Enzymes:  Recent Labs Lab 03/22/14 1922 03/23/14 0014 03/23/14 0612  TROPONINI <0.30 <0.30 <0.30   BNP:  Recent Labs Lab 03/22/14 1142  PROBNP 30804.0*   Thyroid Function Tests:  Recent Labs Lab 03/22/14 1922    TSH 0.901    Urinalysis: No results found for this basename: COLORURINE, APPERANCEUR, LABSPEC, PHURINE, GLUCOSEU, HGBUR, BILIRUBINUR, KETONESUR, PROTEINUR, UROBILINOGEN, NITRITE, LEUKOCYTESUR,  in the last 168 hours  Misc. Labs:   Micro Results: Recent Results (from the past 240 hour(s))  CULTURE, BLOOD (ROUTINE X 2)     Status: None   Collection Time    03/22/14  1:52 PM      Result Value Ref Range Status   Specimen Description BLOOD ARM RIGHT   Final   Special Requests BOTTLES DRAWN AEROBIC ONLY 10CC   Final   Culture  Setup Time     Final   Value: 03/22/2014 18:55     Performed at [facility]   Culture     Final   Value:        BLOOD CULTURE RECEIVED NO GROWTH TO DATE CULTURE WILL BE HELD FOR 5 DAYS BEFORE ISSUING  A FINAL NEGATIVE REPORT     Performed at [facility]   Report Status PENDING   Incomplete  CULTURE, BLOOD (ROUTINE X 2)     Status: None   Collection Time    03/22/14  2:00 PM      Result Value Ref Range Status   Specimen Description BLOOD HAND RIGHT   Final   Special Requests BOTTLES DRAWN AEROBIC AND ANAEROBIC 10CC   Final   Culture  Setup Time     Final   Value: 03/22/2014 18:56     Performed at [facility]   Culture     Final   Value:        BLOOD CULTURE RECEIVED NO GROWTH TO DATE CULTURE WILL BE HELD FOR 5 DAYS BEFORE ISSUING A FINAL NEGATIVE REPORT     Performed at [facility]   Report Status PENDING   Incomplete  CULTURE, BLOOD (ROUTINE X 2)     Status: None   Collection Time    03/23/14  7:20 PM      Result Value Ref Range Status   Specimen Description BLOOD RIGHT HAND   Final   Special Requests BOTTLES DRAWN AEROBIC AND ANAEROBIC 5CC   Final   Culture  Setup Time     Final   Value: 03/23/2014 22:25     Performed at [facility]   Culture     Final   Value:        BLOOD CULTURE RECEIVED NO GROWTH TO DATE CULTURE WILL BE HELD FOR 5 DAYS BEFORE ISSUING A FINAL NEGATIVE REPORT     Performed at [facility]   Report Status PENDING   Incomplete  ANAEROBIC CULTURE     Status: None   Collection Time    03/25/14  3:21 PM      Result Value Ref Range Status   Specimen Description WOUND   Final   Special Requests LEFT POPITEAL EMBOLIUS PT ON ZINACEF   Final   Gram Stain PENDING   Incomplete   Culture     Final   Value: NO ANAEROBES ISOLATED; CULTURE IN PROGRESS FOR 5 DAYS     Performed at [facility]   Report Status PENDING   Incomplete  MRSA PCR SCREENING     Status: None   Collection Time    03/26/14  6:31 AM      Result Value Ref Range Status   MRSA by PCR NEGATIVE  NEGATIVE Final   Comment:            The GeneXpert MRSA Assay (FDA     approved for NASAL specimens     only), is one component of a     comprehensive MRSA colonization     surveillance program. It is not     intended to diagnose MRSA     infection nor to guide or     monitor treatment for     MRSA infections.   Studies/Results: Dg Orthopantogram  03/25/2014   CLINICAL DATA:  Fever.  EXAM: ORTHOPANTOGRAM/PANORAMIC  COMPARISON:  None.  FINDINGS: Poor dentition is noted. No lytic destruction is seen to suggest osteomyelitis or bony abscess.  IMPRESSION: No definite evidence of osteomyelitis or bony abscess seen in the mandible.   Electronically Signed   By: Sabino Dick M.D.   On: 03/25/2014 09:06   Dg Chest 2 View  03/25/2014   CLINICAL DATA:  Fever  EXAM: CHEST  2 VIEW  COMPARISON:  03/23/2014  FINDINGS: Heart size is mildly enlarged with persistent central vascular congestion. Trace pleural effusions are slightly larger than previously. No focal lobar consolidation. No pneumothorax. No acute osseous finding.  IMPRESSION: Cardiomegaly with central vascular congestion and trace effusions.   Electronically Signed   By: Conchita Paris M.D.   On: 03/25/2014 09:03   Mr Brain Wo Contrast  03/25/2014   CLINICAL DATA:  Fever, chills, and dyspnea. Endocarditis. Evaluate for embolic stroke.  EXAM: MRI HEAD WITHOUT  CONTRAST  TECHNIQUE: Multiplanar, multiecho pulse sequences of the brain and surrounding structures were obtained without intravenous contrast.  COMPARISON:  None.  FINDINGS: There is no evidence of acute infarct, intracranial hemorrhage, mass, midline shift, or extra-axial fluid collection. Ventricles and sulci are within normal limits for age. A few punctate foci of T2 hyperintensity in the white matter of the frontal lobes are nonspecific and likely within normal limits for age.  Orbits are unremarkable. Paranasal sinuses and mastoid air cells are clear. Major intracranial vascular flow voids are preserved.  IMPRESSION: No acute infarct.  Unremarkable appearance of the brain for age.   Electronically Signed   By: Logan Bores   On: 03/25/2014 10:20   Ct Angio Ao+bifem W/cm &/or Wo/cm  03/25/2014   ADDENDUM REPORT: 03/25/2014 08:50  ADDENDUM: The following should been included in the impression:  Inflammatory change in the subcutaneous fat overlying the deep fascia of the left vastus lateralis muscle.  These result of the entire exam were called by telephone at the time of interpretation on 03/25/2014 at 8:47 am to Dr. Loralie Champagne , who verbally acknowledged these results.   Electronically Signed   By: Jacqulynn Cadet M.D.   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 Evaluate for aortoiliac embolic disease or evidence of septic arthritis / septic embolization.  EXAM: CT ANGIOGRAPHY OF ABDOMINAL AORTA WITH ILIOFEMORAL RUNOFF  TECHNIQUE: Multidetector CT imaging of the abdomen, pelvis and lower extremities was performed using the standard protocol during bolus administration of intravenous contrast. Multiplanar CT image reconstructions and MIPs were obtained to evaluate the vascular anatomy.  CONTRAST:  187mL OMNIPAQUE IOHEXOL 350 MG/ML SOLN  COMPARISON:  CT chest 03/22/2014 ; radiographs of the left knee 03/23/2014   FINDINGS: VASCULAR  Aorta: Normal caliber aorta. No significant atherosclerotic plaque or irregular wall thickening. No inflammation common dissection or aneurysmal dilatation.  Celiac: Widely patent. Conventional hepatic arterial anatomy. No evidence of mycotic or other visceral artery aneurysm.  SMA: Widely patent. There is a focal fusiform aneurysm with mild hazy arterial wall thickening arising from a distal ileal branch of the SMA beyond the ileocecal branches to the right of midline in the mid to lower abdomen. Aneurysm measures 1.4 x 1.0 cm in diameter which is significantly larger than the 4.4 mm parent vessel.  Renals: Abhorrent right renal artery anatomy. There is a small retrocaval accessory renal artery to the upper pole. The dominant renal artery originates low on the aorta beyond the origin of the IMA. On the left, there is a single dominant renal artery and typical anatomic position. Mild focal heterogeneous atherosclerotic plaque versus focal changes of fibromuscular dysplasia results in at least moderate stenosis of the proximal left renal artery. Evaluation is slightly limited by focal motion artifact. The 2 right-sided renal arteries appear patent.  IMA: Widely patent and unremarkable.  RIGHT Lower Extremity  Inflow: Widely patent and unremarkable comment,  internal and external iliac arteries. Trace plaque along the distal common iliac artery without stenosis.  Outflow: The common, profunda wall and superficial femoral arteries are relatively disease free and widely patent. No evidence of pathology. Calcified atherosclerotic plaque is noted in the above the knee popliteal artery resulting in moderate focal stenosis. Remainder the popliteal artery is widely patent.  Runoff: Mild multifocal narrowing of the posterior tibial artery. Three vessel runoff patent to the ankle.  LEFT lower Extremity  Inflow: Widely patent common, internal and external iliac arteries.  Outflow: The common, profunda and  superficial femoral arteries are widely patent. There is trace atherosclerotic plaque in the superficial femoral artery distribution without significant stenosis. The entire 5.5 cm length of the left popliteal artery is abnormal. The vessel is nearly occluded proximally with a linear defects which can best be described as a focal dissection. There is irregular inflammatory stranding surrounding the vessel involving the lateral head of the gastrocnemius muscle in the intracompartmental fat within the upper calf. The arterial wall is thickened and poorly defined. The mid segment of the vessel at the level of the knee is diffusely irregular and heterogeneous with intermittent contrast opacification in the lumen. There is a short segment complete occlusion secondary to intraluminal filling defect which then extends distally as a semi occlusive thrombus to the level of the tibial plateau. Below the knee, the popliteal artery regains patency just proximal to the trifurcation.  Runoff: Slightly aberrant trifurcation anatomy. The posterior tibial artery arises proximally hand and there is a anterior tibial-peroneal trunk. The runoff vessels remain patent to the ankle.  Veins: No focal venous abnormality.  Review of the MIP images confirms the above findings.  NON-VASCULAR  Lower Chest: Incidentally noted gynecomastia. Small right pleural effusion. Calcified right pleural plaque. Combination dependent atelectasis and patchy consolidation in the lower lobes. Trace atherosclerotic calcifications in the coronary arteries. Borderline cardiomegaly. No pericardial effusion. Unremarkable thoracic esophagus.  Abdomen: Unremarkable appearance of the stomach, duodenum, and adrenal glands. There is an intermediate attenuation 4.1 x 4.1 by 2.6 cm lesion in the superior pole of the spleen concerning for possible septic abscess. Normal hepatic contour and configuration. No discrete hepatic lesion. Multiple small stones within the  gallbladder lumen and neck. No secondary signs to suggest acute cholecystitis. The portal veins remain patent.  The bilateral kidneys are nearly entirely replaced with cysts. The kidneys are also slightly enlarged. No definite enhancing renal lesion. Evaluation is limited by mild motion artifact. Colonic diverticular disease without CT evidence of active inflammation. No evidence of obstruction or focal bowel wall thickening. Normal appendix in the right lower quadrant. The terminal ileum is unremarkable. No free fluid or suspicious adenopathy. There is a 1.1 cm peripherally calcified structure within the small bowel mesenteric left of midline which measures 1.1 cm in diameter (image 105 series 5). This structure abuts the mesenteric border of the adjacent small bowel. No surrounding desmoplastic change or inflammation.  Pelvis: Abnormal left external iliac station lymph nodes. Although not enlarged by CT criteria (7 and 5 mm in short axis) there is interstitial inflammatory stranding in the perinodal fat. These are concerning for reactive adenopathy versus adenitis given the patient's known clinical history. No necrosis or abscess. Under distended but diffusely thick-walled bladder. Unremarkable prostate and seminal vesicles. Calcification of the bilateral vas deferens. No free fluid.  Bones/Soft Tissues: No acute fracture or aggressive appearing lytic or blastic osseous lesion. Transitional anatomy with sacralization of L5. Focal degenerative disc disease at L4-L5 with  vacuum disc phenomenon. Localized inflammatory change of the deeper muscular fascia overlying the vastus lateralis muscle in the left thigh.  IMPRESSION: VASCULAR  1. Diffusely abnormal left popliteal artery. The findings most likely represent a combination of septic embolus, mycotic arteritis and dissection. The entire 5.5 cm length of the left popliteal artery is abnormal with dissection proximally in the above the knee popliteal artery, focal  complete complete occlusion at the level of the joint and partially occluding septic embolus extending to the tibial plateau. There is surrounding inflammatory change involving the deep intermuscular fat of the upper calf and the lateral head of the gastrocnemius muscle. 2. 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 The aneurysm measures 1.4 x 1.1 cm which is significantly larger than the 4.5 mm parent vessel. No evidence of downstream occlusion or small bowel ischemia. 3. Aberrant left trifurcation anatomy. The posterior tibial artery arises proximally bleeding in anterior tibial - peroneal trunk which then bifurcates. Three vessel runoff remains patent to the ankle. 4. Focal moderate stenosis of the proximal left renal artery. Secondary to motion artifact is unclear if this represents focal fibro fatty atherosclerotic plaque or focal changes of FMD. 5. Aberrant right renal artery anatomy as described above without stenosis or FMD. NON VASCULAR  1. Probable 4.1 cm septic abscess in the upper pole of the spleen. Additional differential considerations include splenic hemangioma, hammertoe mal or complex cyst although these are all considered significantly less likely given the patient's overall clinical picture. 2. Left external iliac station adenitis, likely reactive and related to the patient's multifocal septic emboli. 3. Bilateral renal enlargement with innumerable cysts of varying complexity. Differential considerations include dialysis associated acquired cystic kidney disease and autosomal dominant polycystic kidney disease. The former is favored. 4. 1.1 cm peripherally calcified structure within the small bowel mesenteric fat adjacent to a loop of distal jejunum/ileum left of midline without evidence of surrounding desmoplastic change. This finding is nonspecific and may represent sequelae of old granulomatous disease, a peripherally calcified duplication cyst, or the  residua of prior fat necrosis. Carcinoid or desmoid tumor is considered significantly less likely given the complete absence of surrounding desmoplastic change. Recommend followup CT scan of the abdomen and pelvis in 1 year to confirm stability. 5. Under distended but diffusely thick-walled bladder. Recommend clinical correlation for signs and symptoms of cystitis. 6. Cholelithiasis without secondary signs to suggest acute cholecystitis. 7. Scattered colonic diverticula without evidence of active diverticulitis. 8. Transitional L5 anatomy with associated focal L4-L5 degenerative disc disease.  Signed,  Criselda Peaches, MD  [facility]  [facility]  Electronically Signed: By: Jacqulynn Cadet M.D. On: 03/25/2014 08:35   Mr Knee Left  Wo Contrast  03/25/2014   CLINICAL DATA:  Popping injury left knee during yardwork. Numbness in the knee. Pain.  EXAM: MRI OF THE LEFT KNEE WITHOUT CONTRAST  TECHNIQUE: Multiplanar, multisequence MR imaging of the knee was performed. No intravenous contrast was administered.  COMPARISON:  None.  FINDINGS: Despite efforts by the technologist and patient, motion artifact is present on today's exam and could not be eliminated. This reduces exam sensitivity and specificity.  MENISCI  Medial meniscus:  Intact  Lateral meniscus:  Intact  LIGAMENTS  Cruciates:  Intact  Collaterals:  Intact  CARTILAGE  Patellofemoral:  Intact  Medial:  Intact  Lateral:  Intact  Joint:  Small knee effusion.  Popliteal Fossa: Abnormal infiltrative edema in the popliteal fossa abnormal edema in  the distal biceps femorals muscle. Plantaris tendon seems intact. 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 The medial head gastrocnemius is also edematous. Small Baker's cyst. Proximal soleus muscle intact.  Extensor Mechanism: Proximal patellar partial tear potentially with a small partial  avulsion from the inferior pole of the patella.  Bones:  Intact except as noted above.  IMPRESSION: 1. Muscle injury posteriorly with abnormal edema in the biceps femoris, medial head gastrocnemius, and lateral head gastrocnemius. 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 2. Proximal patellar tendon partial tear with a small partially avulsion from the inferior pole of the patella. 3. Small knee effusion.  Small Baker's cyst.   Electronically Signed   By: Sherryl Barters M.D.   On: 03/25/2014 10:45   Dg Ang/ext/uni/or Left  03/25/2014   CLINICAL DATA:  48 year old male with end-stage renal disease. Left leg ischemia.  EXAM: Intraoperative angiogram  COMPARISON:  None.  FINDINGS: Single arteriogram image of intraoperative left lower extremity angiogram submitted.  Tip of infusion catheter via surgical cutdown just above the popliteal artery, potentially the distal aspect of a graft.  Extravasation of contrast from the tibioperoneal trunk, or potentially the posterior tibial artery with no contrast flowing within this artery distally.  The anterior tibial artery is patent to the inferior margin of the exam.  IMPRESSION: Single intraoperative angiographic image of the left lower extremity demonstrates extravasation of contrast from what may be tibioperoneal trunk or posterior tibial artery.  The anterior tibial artery is patent to the inferior margin of the exam.  Signed,  Dulcy Fanny. Earleen Newport, DO  [facility]  [facility]   Electronically Signed   By: Corrie Mckusick O.D.   On: 03/25/2014 17:59   Medications: I have reviewed the patient's current medications. Scheduled Meds: . amLODipine  10 mg Oral Daily  .  ceFAZolin (ANCEF) IV  2 g Intravenous Q T,Th,Sat-1800  . chlorhexidine  15 mL Mouth/Throat BID  . cinacalcet  60 mg Oral QPC supper  . darbepoetin (ARANESP) injection - DIALYSIS  40 mcg  Intravenous Q Thu-HD  . feeding supplement (NEPRO CARB STEADY)  237 mL Oral BID BM  . heparin  5,000 Units Subcutaneous 3 times per day  . hydrALAZINE  37.5 mg Oral 3 times per day  . lisinopril  20 mg Oral Daily  . sevelamer carbonate  800 mg Oral TID WC  . sodium chloride  3 mL Intravenous Q12H  . sodium chloride  3 mL Intravenous Q12H   Continuous Infusions: . sodium chloride 10 mL/hr (03/25/14 1338)   PRN Meds:.sodium chloride, sodium chloride, sodium chloride, feeding supplement (NEPRO CARB STEADY), heparin, lidocaine (PF), lidocaine-prilocaine, oxyCODONE-acetaminophen, pentafluoroprop-tetrafluoroeth, sodium chloride  Assessment/Plan: 48yo M w/ PMH HTN and ESRD on HD presents to the ED with increasing SOB in the setting of a productive cough and fever and was found to have endocarditis.    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 TEE with similar findings and severe mitral regurgitation. Per Nephrology, +MSSA blood culture 5/15 and 02/2014, although negative 01/2014; treated with cefazolin 2g with HD starting 8/20 to continue until 10/20. Initially started on Vanc/Zosyn which was transitioned to Ancef given recent cultures. CT Surgery consulted by Cardiology, and Cards is to take the patient for left and right heart catheterization on  Monday. 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 Also with 4cm probable septic abscess in upper pole of the spleen. Pt is post-op day #1 s/p left fem-pop bypass and since the surgery is doing very well. Continuing abx. - ID consult, appreciate recommendations - Cardiology consult, appreciate recommendations  - CT Surgery consult, appreciate recommendations - VVS consult, appreciate recommendations - Continue Ancef per ID  recommendations    - Blood cultures, NGTD  Acute heart failure: CXR and CTA concerning for CHF. TTE and TTE with mitral valve vegetation and severe regurgitation, also concern for perforation. Cardiology following; CT Surgery consulted. Pt undergoing HD with fluid removal.   End stage renal disease on HD: HD TThSa. Dialysis today.  - F/u with Nephrology, appreciate recommendations.   Hypertension: Pt on ACEi at home which was continued on admission. Amlodipine started this admission. BP elevated today, requiring multiple doses of IV Hydralazine. Oral Hydralazine started by Cardiology. BP well controlled.  - Continue Amlodipine 10mg  daily - Continue Lisinopril 20mg  daily - Hydralazine 37.5mg  q8h  T wave inversion in EKG: Improved. New diffuse T-wave inversions on EKG on admission. Troponin negative x3. Cardiology curbsided in the ED and felt changes due to electolyte abnormalities. K 3.6 and Mg 1.8. Replaced K.   QT prolongation: Resolved. QTc >600 when previously normal. Improved on repeat. Avoiding QTc prolonging drugs.     Dispo: Disposition is deferred at this time, awaiting improvement of current medical problems.     The patient does have a current PCP (Louis Meckel, MD) and does not need an [facility] hospital follow-up appointment after discharge.  The patient does not have transportation limitations that hinder transportation to clinic appointments.  .Services Needed at time of discharge: Y = Yes, Blank = No PT:   OT:   RN:   Equipment:   Other:     LOS: 4 days   Otho Bellows, MD 03/26/2014, 2:24 PM

## 2014-03-26 NOTE — Progress Notes (Signed)
Subjective: Interval History: none.. Comfortable. Re soreness from surgical incision.ports to me severe left knee pain has resolved. Has  Objective: Vital signs in last 24 hours: Temp:  [98 F (36.7 C)-99.2 F (37.3 C)] 98.2 F (36.8 C) (09/19 0719) Pulse Rate:  [89-115] 89 (09/19 0719) Resp:  [19-36] 23 (09/19 0719) BP: (111-182)/(58-89) 111/73 mmHg (09/19 0719) SpO2:  [86 %-100 %] 96 % (09/19 0719) Weight:  [220 lb 14.4 oz (100.2 kg)] 220 lb 14.4 oz (100.2 kg) (09/18 2243)  Intake/Output from previous day: 09/18 0701 - 09/19 0700 In: 1645 [P.O.:240; I.V.:1155; IV Piggyback:250] Out: 150 [Blood:150] Intake/Output this shift: Total I/O In: 30 [I.V.:30] Out: -   Dressing intact on left leg. Plus dorsalis pedis pulse on the left  Lab Results:  Recent Labs  03/24/14 1300 03/26/14 0050  WBC 13.5* 11.3*  HGB 8.2* 7.2*  HCT 24.5* 21.8*  PLT 224 231   BMET  Recent Labs  03/26/14 0050 03/26/14 0340  NA 138 138  K 4.8 5.1  CL 96 97  CO2 26 25  GLUCOSE 100* 96  BUN 34* 36*  CREATININE 9.52* 9.64*  CALCIUM 10.3 10.2    Studies/Results: Dg Orthopantogram  03/25/2014   CLINICAL DATA:  Fever.  EXAM: ORTHOPANTOGRAM/PANORAMIC  COMPARISON:  None.  FINDINGS: Poor dentition is noted. No lytic destruction is seen to suggest osteomyelitis or bony abscess.  IMPRESSION: No definite evidence of osteomyelitis or bony abscess seen in the mandible.   Electronically Signed   By: Sabino Dick M.D.   On: 03/25/2014 09:06   Dg Chest 2 View  03/25/2014   CLINICAL DATA:  Fever  EXAM: CHEST  2 VIEW  COMPARISON:  03/23/2014  FINDINGS: Heart size is mildly enlarged with persistent central vascular congestion. Trace pleural effusions are slightly larger than previously. No focal lobar consolidation. No pneumothorax. No acute osseous finding.  IMPRESSION: Cardiomegaly with central vascular congestion and trace effusions.   Electronically Signed   By: Conchita Paris M.D.   On: 03/25/2014 09:03    Dg Chest 2 View  03/23/2014   CLINICAL DATA:  Weakness, leg pain  EXAM: CHEST  2 VIEW  COMPARISON:  03/22/2014  FINDINGS: Borderline cardiomegaly. Small right pleural effusion with right basilar atelectasis. Central mild vascular congestion and mild perihilar interstitial prominence suspicious for mild interstitial edema. Bony thorax is unremarkable.  IMPRESSION: Small right pleural effusion with right basilar atelectasis. Central mild vascular congestion and mild perihilar interstitial prominence suspicious for mild interstitial edema. No segmental infiltrate.   Electronically Signed   By: Lahoma Crocker M.D.   On: 03/23/2014 09:04   Dg Chest 2 View  03/22/2014   CLINICAL DATA:  Shortness of breath  EXAM: CHEST  2 VIEW  COMPARISON:  07/23/2013  FINDINGS: There is no focal parenchymal opacity, pleural effusion, or pneumothorax. Mild pulmonary vascular congestion. There is mild cardiomegaly.  The osseous structures are unremarkable.  IMPRESSION: Mild pulmonary vascular congestion. Otherwise no active cardiopulmonary disease.   Electronically Signed   By: Kathreen Devoid   On: 03/22/2014 12:14   Ct Angio Chest W/cm &/or Wo Cm  03/22/2014   CLINICAL DATA:  Short of breath.  Cough.  EXAM: CT ANGIOGRAPHY CHEST WITH CONTRAST  TECHNIQUE: Multidetector CT imaging of the chest was performed using the standard protocol during bolus administration of intravenous contrast. Multiplanar CT image reconstructions and MIPs were obtained to evaluate the vascular anatomy.  CONTRAST:  24mL OMNIPAQUE IOHEXOL 350 MG/ML SOLN  COMPARISON:  09/12/2012  FINDINGS: No evidence of a pulmonary embolus.  Heart is mildly enlarged. There are minor coronary artery calcifications. Great vessels are normal in caliber.  There is mediastinal adenopathy. Multiple reference measurements were made. There is a 14 mm short axis node in the right superior mediastinum. There is a 19 mm short axis prevascular lymph node adjacent the left pulmonary artery.  There is a 15 mm right subcarinal lymph node. There are several other prominent to mildly enlarged lymph nodes. Although there were prominent nodes previously, the larger nodes have increased in size. There are prominent hilar lymph nodes not well defined, and without change.  Small right pleural effusion.  No left effusion.  Stable calcified pleural plaque over the right hemidiaphragm.  Lungs show mild diffuse interstitial thickening and hazy and somewhat heterogeneous airspace opacities which have a central to upper lobe predominance. Interstitial thickening is greatest in the lung bases.  Limited evaluation of the upper abdomen shows gallstones but is otherwise unremarkable.  No osteoblastic or osteolytic lesions.  Review of the MIP images confirms the above findings.  IMPRESSION: 1. No evidence of a pulmonary embolism. 2. Mild cardiomegaly, interstitial thickening and hazy areas of airspace lung opacity, as well as a small right effusion, all suggests congestive heart failure. However, there also prominent and mildly enlarged mediastinal lymph nodes. These are likely reactive. The possibility of lung infection should be considered in the proper clinical setting.   Electronically Signed   By: Lajean Manes M.D.   On: 03/22/2014 13:09   Mr Brain Wo Contrast  03/25/2014   CLINICAL DATA:  Fever, chills, and dyspnea. Endocarditis. Evaluate for embolic stroke.  EXAM: MRI HEAD WITHOUT CONTRAST  TECHNIQUE: Multiplanar, multiecho pulse sequences of the brain and surrounding structures were obtained without intravenous contrast.  COMPARISON:  None.  FINDINGS: There is no evidence of acute infarct, intracranial hemorrhage, mass, midline shift, or extra-axial fluid collection. Ventricles and sulci are within normal limits for age. A few punctate foci of T2 hyperintensity in the white matter of the frontal lobes are nonspecific and likely within normal limits for age.  Orbits are unremarkable. Paranasal sinuses and  mastoid air cells are clear. Major intracranial vascular flow voids are preserved.  IMPRESSION: No acute infarct.  Unremarkable appearance of the brain for age.   Electronically Signed   By: Logan Bores   On: 03/25/2014 10:20   Ct Angio Ao+bifem W/cm &/or Wo/cm  03/25/2014   ADDENDUM REPORT: 03/25/2014 08:50  ADDENDUM: The following should been included in the impression:  Inflammatory change in the subcutaneous fat overlying the deep fascia of the left vastus lateralis muscle.  These result of the entire exam were called by telephone at the time of interpretation on 03/25/2014 at 8:47 am to Dr. Loralie Champagne , who verbally acknowledged these results.   Electronically Signed   By: Jacqulynn Cadet M.D.   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 Evaluate for aortoiliac embolic disease or evidence of septic arthritis / septic embolization.  EXAM: CT ANGIOGRAPHY OF ABDOMINAL AORTA WITH ILIOFEMORAL RUNOFF  TECHNIQUE: Multidetector CT imaging of the abdomen, pelvis and lower extremities was performed using the standard protocol during bolus administration of intravenous contrast. Multiplanar CT image reconstructions and MIPs were obtained to evaluate the vascular anatomy.  CONTRAST:  123mL OMNIPAQUE IOHEXOL 350 MG/ML SOLN  COMPARISON:  CT chest 03/22/2014 ; radiographs of the left knee 03/23/2014  FINDINGS: VASCULAR  Aorta: Normal caliber aorta. No significant atherosclerotic plaque or irregular wall thickening. No inflammation common dissection or aneurysmal dilatation.  Celiac: Widely patent. Conventional hepatic arterial anatomy. No evidence of mycotic or other visceral artery aneurysm.  SMA: Widely patent. There is a focal fusiform aneurysm with mild hazy arterial wall thickening arising from a distal ileal branch of the SMA beyond the ileocecal branches to the right of midline in the mid to lower abdomen. Aneurysm measures  1.4 x 1.0 cm in diameter which is significantly larger than the 4.4 mm parent vessel.  Renals: Abhorrent right renal artery anatomy. There is a small retrocaval accessory renal artery to the upper pole. The dominant renal artery originates low on the aorta beyond the origin of the IMA. On the left, there is a single dominant renal artery and typical anatomic position. Mild focal heterogeneous atherosclerotic plaque versus focal changes of fibromuscular dysplasia results in at least moderate stenosis of the proximal left renal artery. Evaluation is slightly limited by focal motion artifact. The 2 right-sided renal arteries appear patent.  IMA: Widely patent and unremarkable.  RIGHT Lower Extremity  Inflow: Widely patent and unremarkable comment, internal and external iliac arteries. Trace plaque along the distal common iliac artery without stenosis.  Outflow: The common, profunda wall and superficial femoral arteries are relatively disease free and widely patent. No evidence of pathology. Calcified atherosclerotic plaque is noted in the above the knee popliteal artery resulting in moderate focal stenosis. Remainder the popliteal artery is widely patent.  Runoff: Mild multifocal narrowing of the posterior tibial artery. Three vessel runoff patent to the ankle.  LEFT lower Extremity  Inflow: Widely patent common, internal and external iliac arteries.  Outflow: The common, profunda and superficial femoral arteries are widely patent. There is trace atherosclerotic plaque in the superficial femoral artery distribution without significant stenosis. The entire 5.5 cm length of the left popliteal artery is abnormal. The vessel is nearly occluded proximally with a linear defects which can best be described as a focal dissection. There is irregular inflammatory stranding surrounding the vessel involving the lateral head of the gastrocnemius muscle in the intracompartmental fat within the upper calf. The arterial wall is  thickened and poorly defined. The mid segment of the vessel at the level of the knee is diffusely irregular and heterogeneous with intermittent contrast opacification in the lumen. There is a short segment complete occlusion secondary to intraluminal filling defect which then extends distally as a semi occlusive thrombus to the level of the tibial plateau. Below the knee, the popliteal artery regains patency just proximal to the trifurcation.  Runoff: Slightly aberrant trifurcation anatomy. The posterior tibial artery arises proximally hand and there is a anterior tibial-peroneal trunk. The runoff vessels remain patent to the ankle.  Veins: No focal venous abnormality.  Review of the MIP images confirms the above findings.  NON-VASCULAR  Lower Chest: Incidentally noted gynecomastia. Small right pleural effusion. Calcified right pleural plaque. Combination dependent atelectasis and patchy consolidation in the lower lobes. Trace atherosclerotic calcifications in the coronary arteries. Borderline cardiomegaly. No pericardial effusion. Unremarkable thoracic esophagus.  Abdomen: Unremarkable appearance of the stomach, duodenum, and adrenal glands. There is an intermediate attenuation 4.1 x 4.1 by 2.6 cm lesion in the superior pole of the spleen concerning for possible septic abscess. Normal hepatic contour and configuration. No discrete hepatic lesion. Multiple small stones within the gallbladder lumen and neck. No secondary signs to suggest acute cholecystitis. The portal veins remain patent.  The bilateral  kidneys are nearly entirely replaced with cysts. The kidneys are also slightly enlarged. No definite enhancing renal lesion. Evaluation is limited by mild motion artifact. Colonic diverticular disease without CT evidence of active inflammation. No evidence of obstruction or focal bowel wall thickening. Normal appendix in the right lower quadrant. The terminal ileum is unremarkable. No free fluid or suspicious  adenopathy. There is a 1.1 cm peripherally calcified structure within the small bowel mesenteric left of midline which measures 1.1 cm in diameter (image 105 series 5). This structure abuts the mesenteric border of the adjacent small bowel. No surrounding desmoplastic change or inflammation.  Pelvis: Abnormal left external iliac station lymph nodes. Although not enlarged by CT criteria (7 and 5 mm in short axis) there is interstitial inflammatory stranding in the perinodal fat. These are concerning for reactive adenopathy versus adenitis given the patient's known clinical history. No necrosis or abscess. Under distended but diffusely thick-walled bladder. Unremarkable prostate and seminal vesicles. Calcification of the bilateral vas deferens. No free fluid.  Bones/Soft Tissues: No acute fracture or aggressive appearing lytic or blastic osseous lesion. Transitional anatomy with sacralization of L5. Focal degenerative disc disease at L4-L5 with vacuum disc phenomenon. Localized inflammatory change of the deeper muscular fascia overlying the vastus lateralis muscle in the left thigh.  IMPRESSION: VASCULAR  1. Diffusely abnormal left popliteal artery. The findings most likely represent a combination of septic embolus, mycotic arteritis and dissection. The entire 5.5 cm length of the left popliteal artery is abnormal with dissection proximally in the above the knee popliteal artery, focal complete complete occlusion at the level of the joint and partially occluding septic embolus extending to the tibial plateau. There is surrounding inflammatory change involving the deep intermuscular fat of the upper calf and the lateral head of the gastrocnemius muscle. 2. 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 The aneurysm measures 1.4 x 1.1 cm which is significantly larger than the 4.5 mm parent vessel. No evidence of downstream occlusion or small bowel ischemia. 3. Aberrant left  trifurcation anatomy. The posterior tibial artery arises proximally bleeding in anterior tibial - peroneal trunk which then bifurcates. Three vessel runoff remains patent to the ankle. 4. Focal moderate stenosis of the proximal left renal artery. Secondary to motion artifact is unclear if this represents focal fibro fatty atherosclerotic plaque or focal changes of FMD. 5. Aberrant right renal artery anatomy as described above without stenosis or FMD. NON VASCULAR  1. Probable 4.1 cm septic abscess in the upper pole of the spleen. Additional differential considerations include splenic hemangioma, hammertoe mal or complex cyst although these are all considered significantly less likely given the patient's overall clinical picture. 2. Left external iliac station adenitis, likely reactive and related to the patient's multifocal septic emboli. 3. Bilateral renal enlargement with innumerable cysts of varying complexity. Differential considerations include dialysis associated acquired cystic kidney disease and autosomal dominant polycystic kidney disease. The former is favored. 4. 1.1 cm peripherally calcified structure within the small bowel mesenteric fat adjacent to a loop of distal jejunum/ileum left of midline without evidence of surrounding desmoplastic change. This finding is nonspecific and may represent sequelae of old granulomatous disease, a peripherally calcified duplication cyst, or the residua of prior fat necrosis. Carcinoid or desmoid tumor is considered significantly less likely given the complete absence of surrounding desmoplastic change. Recommend followup CT scan of the abdomen and pelvis in 1 year to confirm stability. 5. Under distended but diffusely thick-walled bladder. Recommend clinical  correlation for signs and symptoms of cystitis. 6. Cholelithiasis without secondary signs to suggest acute cholecystitis. 7. Scattered colonic diverticula without evidence of active diverticulitis. 8. Transitional  L5 anatomy with associated focal L4-L5 degenerative disc disease.  Signed,  Criselda Peaches, MD  [facility]  [facility]  Electronically Signed: By: Jacqulynn Cadet M.D. On: 03/25/2014 08:35   Mr Knee Left  Wo Contrast  03/25/2014   CLINICAL DATA:  Popping injury left knee during yardwork. Numbness in the knee. Pain.  EXAM: MRI OF THE LEFT KNEE WITHOUT CONTRAST  TECHNIQUE: Multiplanar, multisequence MR imaging of the knee was performed. No intravenous contrast was administered.  COMPARISON:  None.  FINDINGS: Despite efforts by the technologist and patient, motion artifact is present on today's exam and could not be eliminated. This reduces exam sensitivity and specificity.  MENISCI  Medial meniscus:  Intact  Lateral meniscus:  Intact  LIGAMENTS  Cruciates:  Intact  Collaterals:  Intact  CARTILAGE  Patellofemoral:  Intact  Medial:  Intact  Lateral:  Intact  Joint:  Small knee effusion.  Popliteal Fossa: Abnormal infiltrative edema in the popliteal fossa abnormal edema in the distal biceps femorals muscle. Plantaris tendon seems intact. 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 The medial head gastrocnemius is also edematous. Small Baker's cyst. Proximal soleus muscle intact.  Extensor Mechanism: Proximal patellar partial tear potentially with a small partial avulsion from the inferior pole of the patella.  Bones:  Intact except as noted above.  IMPRESSION: 1. Muscle injury posteriorly with abnormal edema in the biceps femoris, medial head gastrocnemius, and lateral head gastrocnemius. 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 2. Proximal patellar tendon partial tear with a small partially avulsion from the inferior pole of the patella. 3. Small knee effusion.  Small Baker's cyst.   Electronically Signed   By: Sherryl Barters M.D.   On: 03/25/2014 10:45   Dg Ang/ext/uni/or Left  03/25/2014   CLINICAL DATA:  48 year old male with end-stage renal disease. Left leg ischemia.  EXAM: Intraoperative angiogram  COMPARISON:  None.  FINDINGS: Single arteriogram image of intraoperative left lower extremity angiogram submitted.  Tip of infusion catheter via surgical cutdown just above the popliteal artery, potentially the distal aspect of a graft.  Extravasation of contrast from the tibioperoneal trunk, or potentially the posterior tibial artery with no contrast flowing within this artery distally.  The anterior tibial artery is patent to the inferior margin of the exam.  IMPRESSION: Single intraoperative angiographic image of the left lower extremity demonstrates extravasation of contrast from what may be tibioperoneal trunk or posterior tibial artery.  The anterior tibial artery is patent to the inferior margin of the exam.  Signed,  Dulcy Fanny. Earleen Newport, DO  [facility]  [facility]   Electronically Signed   By: Corrie Mckusick O.D.   On: 03/25/2014 17:59   Dg Knee Ap/lat W/sunrise Left  03/23/2014   CLINICAL DATA:  Left knee pain and swelling for 3 days  EXAM: DG KNEE - 3 VIEWS  COMPARISON:  None.  FINDINGS: There is no evidence of fracture, dislocation, or joint effusion. There is no evidence of arthropathy or other focal bone abnormality. Soft tissues are unremarkable. Transverse patellar cleft incidentally noted. Vascular calcifications are noted.  IMPRESSION: Negative.   Electronically Signed   By: Roena Malady.D.  On: 03/23/2014 21:17   Anti-infectives: Anti-infectives   Start     Dose/Rate Route Frequency Ordered Stop   03/26/14 0200  cefUROXime (ZINACEF) 1.5 g in dextrose 5 % 50 mL IVPB     1.5 g 100 mL/hr over 30 Minutes Intravenous Every 12 hours 03/25/14 2233 03/26/14 2159   03/25/14 1300  cefUROXime (ZINACEF) 1.5 g in dextrose 5 % 50 mL IVPB     1.5 g 100 mL/hr  over 30 Minutes Intravenous On call to O.R. 03/25/14 1249 03/26/14 0559   03/24/14 1800  ceFEPIme (MAXIPIME) 2 g in dextrose 5 % 50 mL IVPB  Status:  Discontinued     2 g 100 mL/hr over 30 Minutes Intravenous Every T-Th-Sa (1800) 03/23/14 1108 03/24/14 1505   03/24/14 1800  ceFAZolin (ANCEF) IVPB 2 g/50 mL premix     2 g 100 mL/hr over 30 Minutes Intravenous Every T-Th-Sa (1800) 03/24/14 1505     03/24/14 1200  ceFEPIme (MAXIPIME) 2 g in dextrose 5 % 50 mL IVPB  Status:  Discontinued     2 g 100 mL/hr over 30 Minutes Intravenous Every T-Th-Sa (Hemodialysis) 03/22/14 1355 03/23/14 1108   03/24/14 1200  vancomycin (VANCOCIN) IVPB 1000 mg/200 mL premix  Status:  Discontinued     1,000 mg 200 mL/hr over 60 Minutes Intravenous Every T-Th-Sa (Hemodialysis) 03/22/14 2232 03/24/14 1505   03/22/14 1400  vancomycin (VANCOCIN) 1,750 mg in sodium chloride 0.9 % 500 mL IVPB     1,750 mg 250 mL/hr over 120 Minutes Intravenous  Once 03/22/14 1346 03/22/14 1720   03/22/14 1400  ceFEPIme (MAXIPIME) 2 g in dextrose 5 % 50 mL IVPB     2 g 100 mL/hr over 30 Minutes Intravenous  Once 03/22/14 1347 03/22/14 1521   03/22/14 1345  vancomycin (VANCOCIN) 15 mg/kg in sodium chloride 0.9 % 100 mL IVPB  Status:  Discontinued     15 mg/kg 100 mL/hr over 60 Minutes Intravenous  Once 03/22/14 1342 03/22/14 1345      Assessment/Plan: s/p Procedure(s): Left Femoral- Below Knee Popliteal Bypass Graft (Left) Stable ointment emergent left leg revascularization was above-knee to below-knee popliteal bypass. Does have anemia preop and hemoglobin and hematocrit have dropped with hematocrit of 21.8. We'll refer her decision regarding transfusion to primary service and cardiology   LOS: 4 days   Liddie Chichester 03/26/2014, 9:05 AM

## 2014-03-27 DIAGNOSIS — I5041 Acute combined systolic (congestive) and diastolic (congestive) heart failure: Secondary | ICD-10-CM

## 2014-03-27 LAB — BASIC METABOLIC PANEL
ANION GAP: 13 (ref 5–15)
BUN: 27 mg/dL — ABNORMAL HIGH (ref 6–23)
CALCIUM: 10.8 mg/dL — AB (ref 8.4–10.5)
CO2: 28 mEq/L (ref 19–32)
CREATININE: 7.77 mg/dL — AB (ref 0.50–1.35)
Chloride: 97 mEq/L (ref 96–112)
GFR, EST AFRICAN AMERICAN: 9 mL/min — AB (ref >90)
GFR, EST NON AFRICAN AMERICAN: 7 mL/min — AB (ref >90)
Glucose, Bld: 145 mg/dL — ABNORMAL HIGH (ref 70–99)
Potassium: 4 mEq/L (ref 3.7–5.3)
Sodium: 138 mEq/L (ref 137–147)

## 2014-03-27 LAB — PROTIME-INR
INR: 1.19 (ref 0.00–1.49)
PROTHROMBIN TIME: 15.1 s (ref 11.6–15.2)

## 2014-03-27 LAB — TYPE AND SCREEN
ABO/RH(D): B POS
ANTIBODY SCREEN: NEGATIVE
UNIT DIVISION: 0
UNIT DIVISION: 0

## 2014-03-27 LAB — CBC
HCT: 26.2 % — ABNORMAL LOW (ref 39.0–52.0)
HCT: 27.2 % — ABNORMAL LOW (ref 39.0–52.0)
Hemoglobin: 8.8 g/dL — ABNORMAL LOW (ref 13.0–17.0)
Hemoglobin: 9 g/dL — ABNORMAL LOW (ref 13.0–17.0)
MCH: 30 pg (ref 26.0–34.0)
MCH: 30.4 pg (ref 26.0–34.0)
MCHC: 33.1 g/dL (ref 30.0–36.0)
MCHC: 33.6 g/dL (ref 30.0–36.0)
MCV: 90.7 fL (ref 78.0–100.0)
MCV: 90.7 fL (ref 78.0–100.0)
PLATELETS: 229 10*3/uL (ref 150–400)
PLATELETS: 230 10*3/uL (ref 150–400)
RBC: 2.89 MIL/uL — ABNORMAL LOW (ref 4.22–5.81)
RBC: 3 MIL/uL — ABNORMAL LOW (ref 4.22–5.81)
RDW: 15.7 % — AB (ref 11.5–15.5)
RDW: 15.6 % — AB (ref 11.5–15.5)
WBC: 13 10*3/uL — ABNORMAL HIGH (ref 4.0–10.5)
WBC: 12.8 10*3/uL — AB (ref 4.0–10.5)

## 2014-03-27 LAB — IRON AND TIBC
Iron: 22 ug/dL — ABNORMAL LOW (ref 42–135)
SATURATION RATIOS: 14 % — AB (ref 20–55)
TIBC: 154 ug/dL — AB (ref 215–435)
UIBC: 132 ug/dL (ref 125–400)

## 2014-03-27 LAB — FERRITIN: FERRITIN: 2431 ng/mL — AB (ref 22–322)

## 2014-03-27 LAB — PROCALCITONIN: Procalcitonin: 7.06 ng/mL

## 2014-03-27 MED ORDER — ASPIRIN 81 MG PO CHEW
81.0000 mg | CHEWABLE_TABLET | ORAL | Status: AC
  Administered 2014-03-28: 81 mg via ORAL
  Filled 2014-03-27: qty 1

## 2014-03-27 MED ORDER — SODIUM CHLORIDE 0.9 % IJ SOLN
3.0000 mL | Freq: Two times a day (BID) | INTRAMUSCULAR | Status: DC
  Administered 2014-03-27 (×2): 3 mL via INTRAVENOUS

## 2014-03-27 MED ORDER — SODIUM CHLORIDE 0.9 % IJ SOLN: 3.0000 mL | INTRAMUSCULAR | Status: DC | PRN

## 2014-03-27 MED ORDER — SODIUM CHLORIDE 0.9 % IV SOLN: 250.0000 mL | INTRAVENOUS | Status: DC | PRN

## 2014-03-27 NOTE — Progress Notes (Signed)
   LOS: 5 days   Subjective: - Left leg pain "sore" but overall much better than prior to surgery, controlled - Had 2 x "hot flashes" with sweating, still fewer than prior - Eating well, no BM recently but states that is normal for him - Had appropriate Hb bump s/p 2u pRBC yesterday, 7.0 ->8.0  Objective: BP 129/81  Pulse 95  Temp(Src) 97.6 F (36.4 C) (Oral)  Resp 33  Ht 5\' 11"  (1.803 m)  Wt 104.6 kg (230 lb 9.6 oz)  BMI 32.18 kg/m2  SpO2 100%  Intake/Output Summary (Last 24 hours) at 03/27/14 1009 Last data filed at 03/27/14 0000  Gross per 24 hour  Intake    785 ml  Output   1000 ml  Net   -215 ml    Physical Exam: GEN: no notable diaphoresis, awake and oriented, in mild distress EYES: EOMI CV: II/VI holosystolic murmur greatest at mid-clavicular line, tachycardic, S1/S2 no gallop PULM: Some fine crackles at R base but otherwise clear crackles ABD: soft, NT/ND, +BS EXT: No edema, 2+ pulses bilaterally LE, much improved L sided lower extremity pulse s/p pop bypass  Labs/Studies: I have reviewed labs and studies from last 24hrs per EMR.  Medications: I have reviewed the patient's current medications.  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   MSSA MV Infective Endocarditis c/b L  popliteal emboli: Severe MR, LE CTA revealing L pop emboli s/p embolectomy 9/18. Being followed by cards, ID, CT surgery, vascular surgery, ortho; appreciate all recommendations. MRI head negative for embolic events. MRI L knee complete, per ortho not a surgical issue. Completed orthopanthogram (dental service c/s). Awaiting L/RHC - ancef (9/17-)s/p vanc/cefepime (9/15-9/17)  - to OR with Dr. Donnetta Hutching (vasc surg) 9/18)  - L/RHC planned for Monday 9/21 with Dr. Aundra Dubin in prep for MV repair vs replacement with CT surgery   Anemia: Normocytic. Likely acute blood loss anemia on top of anemia of chronic kidney disease. Hb down to 7.0 post-operatively from 8.2 with a baseline around 11. Appropriate bump to 9.0 s/p 2upRBC in HD.  - f/u iron studies - continue aranesp 40 mcg IV every thusday dialysis  Hypertension:  - per cards- hydral 37.5 TID   Lisinopril 20 amlodipine 10  ERSD: On TTS dialysis. Reports dry wt 103 kg.   - ESA qTh; Illene Regulus, Sensipar  ERSD: On TTS dialysis. Reports dry wt 103 kg.  - Nephrology c/s; appreciate recs - ESA qTh; Renvela, Sensipar  Diet: Per RD, Provide Nepro Shake po BID  Dispo: Floor status  This is a Careers information officer Note.  The care of the patient was discussed with Dr. Eulas Post and the assessment and plan formulated with their assistance.  Please see their attached note for official documentation of the daily encounter.

## 2014-03-27 NOTE — Progress Notes (Signed)
Subjective:  Comfortable in bed, no dyspnea, improving soreness at incision sites on left leg  Objective: Vital signs in last 24 hours: Temp:  [97.6 F (36.4 C)-99.1 F (37.3 C)] 97.6 F (36.4 C) (09/20 0723) Pulse Rate:  [91-104] 95 (09/20 0723) Resp:  [22-39] 33 (09/20 0723) BP: (126-177)/(73-102) 129/81 mmHg (09/20 0723) SpO2:  [95 %-100 %] 100 % (09/20 0723) Weight:  [104.6 kg (230 lb 9.6 oz)] 104.6 kg (230 lb 9.6 oz) (09/20 0458) Weight change: 4.4 kg (9 lb 11.2 oz)  Intake/Output from previous day: 09/19 0701 - 09/20 0700 In: 1250 [P.O.:840; I.V.:75; Blood:335] Out: 1000  Intake/Output this shift:   Lab Results:  Recent Labs  03/26/14 1316 03/26/14 2347  WBC 12.0* 12.8*  HGB 7.0* 9.0*  HCT 21.4* 27.2*  PLT 257 230   BMET:  Recent Labs  03/26/14 0050 03/26/14 0340  NA 138 138  K 4.8 5.1  CL 96 97  CO2 26 25  GLUCOSE 100* 96  BUN 34* 36*  CREATININE 9.52* 9.64*  CALCIUM 10.3 10.2  ALBUMIN 2.3* 2.1*   No results found for this basename: PTH,  in the last 72 hours Iron Studies: No results found for this basename: IRON, TIBC, TRANSFERRIN, FERRITIN,  in the last 72 hours  Studies/Results: No results found.  EXAM:  General appearance: Alert, in no apparent distress  Resp: CTA without rales, rhonchi, or wheezes  Cardio: RRR with Gr II/VI systolic murmur, no rub  GI: + BS, soft and nontender  Extremities: No edema, dressing over L popliteal area Access: AVF @ RFA with + bruit   Dialysis Orders: [facility]  4 hr 103kgs 2K/2Ca 9000 bolus and 1500 mid Heparin 500/1.5  hectorol 9 mch Aranesp 25 mcg   Assessment/Plan: 1. Mitral valve endocarditis - per TEE 9/17; BCs + for MSSA since 11/2013, s/p intermittent Ancef per HD, worsening dyspnea over last 3 wks; heart catheterization tomorrow with MVR per Dr. Roxy Manns pending.  2. 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 Donnetta Hutching.  3. MSSA baceremia - + BCs since 11/2013, Ancef 2 g through 10/20.  4. ESRD - HD on TTS [facility], K 5.1. HD pending today.  5. HTN/Volume - BP 133/80 on Amlodipine 10 mg qd, Hydralazine 37.5 mg q8h, Lisinopril 20 mg qd; wt 100.2 kg, below EDW.  6. Anemia - Hgb down to 7.2 post-surgery 9/18, now 9 s/p 2 U PRBCs 9/19, Aranesp 40 mcg on Thurs.  7. Sec HPT - Ca 10.2 (11.7 corrected), P 7.5; Hectorol on hold, Sensipar 60 mg qd, Renvela with meals.  8. Nutrition - Alb 2.1, renal diet, multivitamin, supplement.     LOS: 5 days   LYLES,CHARLES 03/27/2014,10:01 AM   I have seen and examined this patient and agree with plan as outlined by C. Rick Duff A,MD 03/27/2014 12:15 PM

## 2014-03-27 NOTE — Progress Notes (Signed)
  I have seen and examined the patient, and reviewed the daily progress note by Dahlia Bailiff, MS IV and discussed the care of the patient with Timothy Palmer. Please see my progress note from 03/27/2014 for further details regarding assessment and plan.    Signed:  Otho Bellows, MD 03/27/2014, 2:12 PM

## 2014-03-27 NOTE — Progress Notes (Signed)
  I have seen and examined the patient, and reviewed the daily progress note by Dahlia Bailiff, MS IV and discussed the care of the patient with him. Please see my progress note from 03/26/2014 for further details regarding assessment and plan.    Signed:  Otho Bellows, MD 03/27/2014, 2:12 PM

## 2014-03-27 NOTE — Progress Notes (Signed)
Subjective: Pt has been doing well since the fem-pop bypass surgery. He has not had any more fevers. Some diaphoresis overnight and this morning.  Denies chest pain or SOB.  Objective: Vital signs in last 24 hours: Filed Vitals:   03/27/14 0414 03/27/14 0458 03/27/14 0723 03/27/14 1238  BP: 141/81  129/81 132/81  Pulse: 93  95 91  Temp: 98.2 F (36.8 C)  97.6 F (36.4 C) 98.8 F (37.1 C)  TempSrc: Oral  Oral Oral  Resp: 26  33 27  Height:      Weight:  230 lb 9.6 oz (104.6 kg)    SpO2: 100%  100% 100%   Weight change: 9 lb 11.2 oz (4.4 kg)  Intake/Output Summary (Last 24 hours) at 03/27/14 1355 Last data filed at 03/27/14 0000  Gross per 24 hour  Intake    555 ml  Output   1000 ml  Net   -445 ml   Vitals reviewed. General: Lying up in bed  HEENT: EOMI Cardiac: RRR, 3/6 holosystolic murmur at the apex Pulm: Clear to auscultation bilaterally, no wheezes, rales, or rhonchi Abd: Soft, nontender, nondistended, BS present Ext: Warm and well perfused. 2+ DP pulse in right foot, 1+ DP pulse in left foot.  Neuro: Alert and oriented X3, moves all 4 extremities, nonfocal   Lab Results: Basic Metabolic Panel:  Recent Labs Lab 03/22/14 1142 03/22/14 1349  03/26/14 0050 03/26/14 0340  NA 140  --   < > 138 138  K 3.6*  --   < > 4.8 5.1  CL 94*  --   < > 96 97  CO2 33*  --   < > 26 25  GLUCOSE 105*  --   < > 100* 96  BUN 12  --   < > 34* 36*  CREATININE 5.85*  --   < > 9.52* 9.64*  CALCIUM 9.8  --   < > 10.3 10.2  MG  --  1.8  --   --   --   PHOS  --   --   < > 7.4* 7.5*  < > = values in this interval not displayed. CBC:  Recent Labs Lab 03/26/14 1316 03/26/14 2347  WBC 12.0* 12.8*  HGB 7.0* 9.0*  HCT 21.4* 27.2*  MCV 92.6 90.7  PLT 257 230   Cardiac Enzymes:  Recent Labs Lab 03/22/14 1922 03/23/14 0014 03/23/14 0612  TROPONINI <0.30 <0.30 <0.30   BNP:  Recent Labs Lab 03/22/14 1142  PROBNP 30804.0*   Thyroid Function Tests:  Recent Labs Lab  03/22/14 1922  TSH 0.901    Urinalysis: No results found for this basename: COLORURINE, APPERANCEUR, LABSPEC, PHURINE, GLUCOSEU, HGBUR, BILIRUBINUR, KETONESUR, PROTEINUR, UROBILINOGEN, NITRITE, LEUKOCYTESUR,  in the last 168 hours  Misc. Labs:   Micro Results: Recent Results (from the past 240 hour(s))  CULTURE, BLOOD (ROUTINE X 2)     Status: None   Collection Time    03/22/14  1:52 PM      Result Value Ref Range Status   Specimen Description BLOOD ARM RIGHT   Final   Special Requests BOTTLES DRAWN AEROBIC ONLY 10CC   Final   Culture  Setup Time     Final   Value: 03/22/2014 18:55     Performed at [facility]   Culture     Final   Value:        BLOOD CULTURE RECEIVED NO GROWTH TO DATE CULTURE WILL BE HELD FOR 5  DAYS BEFORE ISSUING A FINAL NEGATIVE REPORT     Performed at [facility]   Report Status PENDING   Incomplete  CULTURE, BLOOD (ROUTINE X 2)     Status: None   Collection Time    03/22/14  2:00 PM      Result Value Ref Range Status   Specimen Description BLOOD HAND RIGHT   Final   Special Requests BOTTLES DRAWN AEROBIC AND ANAEROBIC 10CC   Final   Culture  Setup Time     Final   Value: 03/22/2014 18:56     Performed at [facility]   Culture     Final   Value:        BLOOD CULTURE RECEIVED NO GROWTH TO DATE CULTURE WILL BE HELD FOR 5 DAYS BEFORE ISSUING A FINAL NEGATIVE REPORT     Performed at [facility]   Report Status PENDING   Incomplete  RESPIRATORY VIRUS PANEL     Status: None   Collection Time    03/22/14  9:22 PM      Result Value Ref Range Status   Source - RVPAN NASOPHARYNGEAL   Final   Respiratory Syncytial Virus A NOT DETECTED   Final   Respiratory Syncytial Virus B NOT DETECTED   Final   Influenza A NOT DETECTED   Final   Influenza B NOT DETECTED   Final   Parainfluenza 1 NOT DETECTED   Final   Parainfluenza 2 NOT DETECTED   Final   Parainfluenza 3 NOT DETECTED   Final   Metapneumovirus NOT DETECTED   Final    Rhinovirus NOT DETECTED   Final   Adenovirus NOT DETECTED   Final   Influenza A H1 NOT DETECTED   Final   Influenza A H3 NOT DETECTED   Final   Comment: (NOTE)           Normal Reference Range for each Analyte: NOT DETECTED     Testing performed using the Luminex xTAG Respiratory Viral Panel test     kit.     The analytical performance characteristics of this assay have been     determined by [facility].  The modifications have not been     cleared or approved by the FDA. This assay has been validated pursuant     to the CLIA regulations and is used for clinical purposes.     Performed at [place], BLOOD (ROUTINE X 2)     Status: None   Collection Time    03/23/14  7:20 PM      Result Value Ref Range Status   Specimen Description BLOOD RIGHT HAND   Final   Special Requests BOTTLES DRAWN AEROBIC AND ANAEROBIC 5CC   Final   Culture  Setup Time     Final   Value: 03/23/2014 22:25     Performed at [facility]   Culture     Final   Value:        BLOOD CULTURE RECEIVED NO GROWTH TO DATE CULTURE WILL BE HELD FOR 5 DAYS BEFORE ISSUING A FINAL NEGATIVE REPORT     Performed at [facility]   Report Status PENDING   Incomplete  ANAEROBIC CULTURE     Status: None   Collection Time    03/25/14  3:21 PM      Result Value Ref Range Status   Specimen Description WOUND   Final   Special Requests LEFT POPITEAL  EMBOLIUS PT ON ZINACEF   Final   Gram Stain PENDING   Incomplete   Culture     Final   Value: NO ANAEROBES ISOLATED; CULTURE IN PROGRESS FOR 5 DAYS     Performed at [facility]   Report Status PENDING   Incomplete  WOUND CULTURE     Status: None   Collection Time    03/25/14  3:21 PM      Result Value Ref Range Status   Specimen Description WOUND   Final   Special Requests LEFT POPITEAL EMBOLIUS PT ON ZINCEF   Final   Gram Stain PENDING   Incomplete   Culture     Final   Value: NO GROWTH 1 DAY     Performed at [facility]   Report Status PENDING   Incomplete  MRSA PCR SCREENING     Status: None   Collection Time    03/26/14  6:31 AM      Result Value Ref Range Status   MRSA by PCR NEGATIVE  NEGATIVE Final   Comment:            The GeneXpert MRSA Assay (FDA     approved for NASAL specimens     only), is one component of a     comprehensive MRSA colonization     surveillance program. It is not     intended to diagnose MRSA     infection nor to guide or     monitor treatment for     MRSA infections.   Studies/Results: Dg Ang/ext/uni/or Left  03/25/2014   CLINICAL DATA:  48 year old male with end-stage renal disease. Left leg ischemia.  EXAM: Intraoperative angiogram  COMPARISON:  None.  FINDINGS: Single arteriogram image of intraoperative left lower extremity angiogram submitted.  Tip of infusion catheter via surgical cutdown just above the popliteal artery, potentially the distal aspect of a graft.  Extravasation of contrast from the tibioperoneal trunk, or potentially the posterior tibial artery with no contrast flowing within this artery distally.  The anterior tibial artery is patent to the inferior margin of the exam.  IMPRESSION: Single intraoperative angiographic image of the left lower extremity demonstrates extravasation of contrast from what may be tibioperoneal trunk or posterior tibial artery.  The anterior tibial artery is patent to the inferior margin of the exam.  Signed,  Dulcy Fanny. Earleen Newport, DO  [facility]  [facility]   Electronically Signed   By: Corrie Mckusick O.D.   On: 03/25/2014 17:59   Medications: I have reviewed the patient's current medications. Scheduled Meds: . sodium chloride   Intravenous Once  . amLODipine  10 mg Oral Daily  . [START ON 03/28/2014] aspirin  81 mg Oral Pre-Cath  .  ceFAZolin (ANCEF) IV  2 g Intravenous Q T,Th,Sat-1800  . chlorhexidine  15 mL Mouth/Throat BID  . cinacalcet  60 mg Oral QPC supper  . darbepoetin  (ARANESP) injection - DIALYSIS  40 mcg Intravenous Q Thu-HD  . feeding supplement (NEPRO CARB STEADY)  237 mL Oral BID BM  . heparin  5,000 Units Subcutaneous 3 times per day  . hydrALAZINE  37.5 mg Oral 3 times per day  . lisinopril  20 mg Oral Daily  . sevelamer carbonate  800 mg Oral TID WC  . sodium chloride  3 mL Intravenous Q12H  . sodium chloride  3 mL Intravenous Q12H  . sodium chloride  3 mL Intravenous Q12H   Continuous  Infusions: . sodium chloride 10 mL/hr (03/25/14 1338)   PRN Meds:.sodium chloride, sodium chloride, sodium chloride, sodium chloride, feeding supplement (NEPRO CARB STEADY), heparin, lidocaine (PF), lidocaine-prilocaine, oxyCODONE-acetaminophen, pentafluoroprop-tetrafluoroeth, sodium chloride, sodium chloride  Assessment/Plan: 48yo M w/ PMH HTN and ESRD on HD presents to the ED with increasing SOB in the setting of a productive cough and fever and was found to have endocarditis.    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 TEE with similar findings and severe mitral regurgitation. Per Nephrology, +MSSA blood culture 5/15 and 02/2014, although negative 01/2014; treated with cefazolin 2g with HD starting 8/20 to continue until 10/20. Initially started on Vanc/Zosyn which was transitioned to Ancef given recent cultures. CT Surgery consulted by Cardiology, and Cards is to take the patient for left and right heart catheterization on Monday. 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 Also with 4cm probable septic abscess in upper pole of the spleen. Pt is post-op day #2 s/p left fem-pop bypass and since the surgery is doing very well. Continuing abx. - ID consult, appreciate recommendations - Cardiology consult, appreciate recommendations  - CT Surgery  consult, appreciate recommendations - VVS consult, appreciate recommendations - Continue Ancef per ID recommendations    - Blood cultures, NGTD - Left and right heart cath Monday  Acute heart failure: CXR and CTA concerning for CHF. TTE and TTE with mitral valve vegetation and severe regurgitation, also concern for perforation. Normal EF. Cardiology following; CT Surgery consulted. Pt undergoing HD TThSa with fluid removal.   End stage renal disease on HD: HD TThSa. Dialysis 9/19, 1L removed. - F/u with Nephrology, appreciate recommendations.   Hypertension: Pt on ACEi at home which was continued on admission. Amlodipine started this admission. BP elevated today, requiring multiple doses of IV Hydralazine. Oral Hydralazine started by Cardiology. BP well controlled.  - Continue Amlodipine 24m daily - Continue Lisinopril 276mdaily - Hydralazine 37.41m29m8h  T wave inversion in EKG: Improved. New diffuse T-wave inversions on EKG on admission. Troponin negative x3. Cardiology curbsided in the ED and felt changes due to electolyte abnormalities. K 3.6 and Mg 1.8. Replaced K.   QT prolongation: Resolved. QTc >600 when previously normal. Improved on repeat. Avoiding QTc prolonging drugs.     Dispo: Disposition is deferred at this time, awaiting improvement of current medical problems.     The patient does have a current PCP (KelLouis MeckelD) and [facility].  The patient does not have transportation limitations that hinder transportation to clinic appointments.  .Services Needed at time of discharge: Y = Yes, Blank = No PT:   OT:   RN:   Equipment:   Other:     LOS: 5 days   KatOtho BellowsD 03/27/2014, 1:55 PM

## 2014-03-27 NOTE — Progress Notes (Signed)
Patient ID: Timothy Palmer, male   DOB: 20-Apr-1966, 48 y.o.   MRN: [id number] Comfortable this morning. Has been up walking. Dressings intact with no evidence of hematoma at his incisions. 2-3+ dorsalis pedis pulse.  No vascular issues currently. For further cardiac workup tomorrow

## 2014-03-27 NOTE — Progress Notes (Signed)
Subjective: No CP  Breathing is fair   Objective: Filed Vitals:   03/26/14 2345 03/27/14 0414 03/27/14 0458 03/27/14 0723  BP: 126/73 141/81  129/81  Pulse: 91 93  95  Temp: 98.3 F (36.8 C) 98.2 F (36.8 C)  97.6 F (36.4 C)  TempSrc: Oral Oral  Oral  Resp: 25 26  33  Height:      Weight:   230 lb 9.6 oz (104.6 kg)   SpO2: 97% 100%  100%   Weight change: 9 lb 11.2 oz (4.4 kg)  Intake/Output Summary (Last 24 hours) at 03/27/14 1108 Last data filed at 03/27/14 0000  Gross per 24 hour  Intake    570 ml  Output   1000 ml  Net   -430 ml    General: Alert, awake, oriented x3, in no acute distress Neck:  JVP is normal Heart: Regular rate and rhythm  Gr III/VI systolic murmur at apex Lungs: Clear to auscultation.  No rales or wheezes. Exemities:  1+ edema.   Neuro: Grossly intact, nonfocal.   Lab Results: Results for orders placed during the hospital encounter of 03/22/14 (from the past 24 hour(s))  CBC     Status: Abnormal   Collection Time    03/26/14  1:16 PM      Result Value Ref Range   WBC 12.0 (*) 4.0 - 10.5 K/uL   RBC 2.31 (*) 4.22 - 5.81 MIL/uL   Hemoglobin 7.0 (*) 13.0 - 17.0 g/dL   HCT 21.4 (*) 39.0 - 52.0 %   MCV 92.6  78.0 - 100.0 fL   MCH 30.3  26.0 - 34.0 pg   MCHC 32.7  30.0 - 36.0 g/dL   RDW 15.4  11.5 - 15.5 %   Platelets 257  150 - 400 K/uL  TYPE AND SCREEN     Status: None   Collection Time    03/26/14  2:30 PM      Result Value Ref Range   ABO/RH(D) B POS     Antibody Screen NEG     Sample Expiration 03/29/2014     Unit Number [id number]     Blood Component Type RED CELLS,LR     Unit division 00     Status of Unit ISSUED,FINAL     Transfusion Status OK TO TRANSFUSE     Crossmatch Result Compatible     Unit Number [id number]     Blood Component Type RED CELLS,LR     Unit division 00     Status of Unit ISSUED,FINAL     Transfusion Status OK TO TRANSFUSE     Crossmatch Result Compatible    PREPARE RBC (CROSSMATCH)     Status:  None   Collection Time    03/26/14  2:30 PM      Result Value Ref Range   Order Confirmation ORDER PROCESSED BY BLOOD BANK    ABO/RH     Status: None   Collection Time    03/26/14  2:30 PM      Result Value Ref Range   ABO/RH(D) B POS    CBC     Status: Abnormal   Collection Time    03/26/14 11:47 PM      Result Value Ref Range   WBC 12.8 (*) 4.0 - 10.5 K/uL   RBC 3.00 (*) 4.22 - 5.81 MIL/uL   Hemoglobin 9.0 (*) 13.0 - 17.0 g/dL   HCT 27.2 (*) 39.0 - 52.0 %   MCV 90.7  78.0 - 100.0 fL   MCH 30.0  26.0 - 34.0 pg   MCHC 33.1  30.0 - 36.0 g/dL   RDW 15.6 (*) 11.5 - 15.5 %   Platelets 230  150 - 400 K/uL  PROCALCITONIN     Status: None   Collection Time    03/27/14 12:03 AM      Result Value Ref Range   Procalcitonin 7.06      Studies/Results: No results found.  Medications:  Reviewed   @PROBHOSP @  1.  Endocarditis.  Plan for R and L heart cath tomorrow.  Continue current regimen  2.  Acute distolic CHF MR  Continue dialysis  .  LOS: 5 days   Dorris Carnes 03/27/2014, 11:08 AM

## 2014-03-27 NOTE — Progress Notes (Signed)
ANTIBIOTIC CONSULT NOTE - [place] for Ancef Indication: MV endocarditis  No Known Allergies  Patient Measurements: Height: '5\' 11"'  (180.3 cm) Weight: 230 lb 9.6 oz (104.6 kg) IBW/kg (Calculated) : 75.3   Vital Signs: Temp: 98.8 F (37.1 C) (09/20 1238) Temp src: Oral (09/20 1238) BP: 132/81 mmHg (09/20 1238) Pulse Rate: 91 (09/20 1238) Intake/Output from previous day: 09/19 0701 - 09/20 0700 In: 1250 [P.O.:840; I.V.:75; Blood:335] Out: 1000      Recent Labs  03/24/14 1300 03/26/14 0050 03/26/14 0340 03/26/14 1316 03/26/14 2347  WBC 13.5* 11.3*  --  12.0* 12.8*  HGB 8.2* 7.2*  --  7.0* 9.0*  PLT 224 231  --  257 230  CREATININE 12.46* 9.52* 9.64*  --   --    Estimated Creatinine Clearance: 11.8 ml/min (by C-G formula based on Cr of 9.64). No results found for this basename: VANCOTROUGH, Corlis Leak, VANCORANDOM, GENTTROUGH, GENTPEAK, GENTRANDOM, TOBRATROUGH, TOBRAPEAK, TOBRARND, AMIKACINPEAK, AMIKACINTROU, AMIKACIN,  in the last 72 hours   Microbiology: Recent Results (from the past 720 hour(s))  CULTURE, BLOOD (ROUTINE X 2)     Status: None   Collection Time    03/22/14  1:52 PM      Result Value Ref Range Status   Specimen Description BLOOD ARM RIGHT   Final   Special Requests BOTTLES DRAWN AEROBIC ONLY 10CC   Final   Culture  Setup Time     Final   Value: 03/22/2014 18:55     Performed at [facility]   Culture     Final   Value:        BLOOD CULTURE RECEIVED NO GROWTH TO DATE CULTURE WILL BE HELD FOR 5 DAYS BEFORE ISSUING A FINAL NEGATIVE REPORT     Performed at [facility]   Report Status PENDING   Incomplete  CULTURE, BLOOD (ROUTINE X 2)     Status: None   Collection Time    03/22/14  2:00 PM      Result Value Ref Range Status   Specimen Description BLOOD HAND RIGHT   Final   Special Requests BOTTLES DRAWN AEROBIC AND ANAEROBIC 10CC   Final   Culture  Setup Time     Final   Value: 03/22/2014 18:56      Performed at [facility]   Culture     Final   Value:        BLOOD CULTURE RECEIVED NO GROWTH TO DATE CULTURE WILL BE HELD FOR 5 DAYS BEFORE ISSUING A FINAL NEGATIVE REPORT     Performed at [facility]   Report Status PENDING   Incomplete  RESPIRATORY VIRUS PANEL     Status: None   Collection Time    03/22/14  9:22 PM      Result Value Ref Range Status   Source - RVPAN NASOPHARYNGEAL   Final   Respiratory Syncytial Virus A NOT DETECTED   Final   Respiratory Syncytial Virus B NOT DETECTED   Final   Influenza A NOT DETECTED   Final   Influenza B NOT DETECTED   Final   Parainfluenza 1 NOT DETECTED   Final   Parainfluenza 2 NOT DETECTED   Final   Parainfluenza 3 NOT DETECTED   Final   Metapneumovirus NOT DETECTED   Final   Rhinovirus NOT DETECTED   Final   Adenovirus NOT DETECTED   Final   Influenza A H1 NOT DETECTED   Final   Influenza  A H3 NOT DETECTED   Final   Comment: (NOTE)           Normal Reference Range for each Analyte: NOT DETECTED     Testing performed using the Luminex xTAG Respiratory Viral Panel test     kit.     The analytical performance characteristics of this assay have been     determined by [facility].  The modifications have not been     cleared or approved by the FDA. This assay has been validated pursuant     to the CLIA regulations and is used for clinical purposes.     Performed at [place], BLOOD (ROUTINE X 2)     Status: None   Collection Time    03/23/14  7:20 PM      Result Value Ref Range Status   Specimen Description BLOOD RIGHT HAND   Final   Special Requests BOTTLES DRAWN AEROBIC AND ANAEROBIC 5CC   Final   Culture  Setup Time     Final   Value: 03/23/2014 22:25     Performed at [facility]   Culture     Final   Value:        BLOOD CULTURE RECEIVED NO GROWTH TO DATE CULTURE WILL BE HELD FOR 5 DAYS BEFORE ISSUING A FINAL NEGATIVE REPORT     Performed at [facility]   Report  Status PENDING   Incomplete  ANAEROBIC CULTURE     Status: None   Collection Time    03/25/14  3:21 PM      Result Value Ref Range Status   Specimen Description WOUND   Final   Special Requests LEFT POPITEAL EMBOLIUS PT ON ZINACEF   Final   Gram Stain PENDING   Incomplete   Culture     Final   Value: NO ANAEROBES ISOLATED; CULTURE IN PROGRESS FOR 5 DAYS     Performed at [facility]   Report Status PENDING   Incomplete  WOUND CULTURE     Status: None   Collection Time    03/25/14  3:21 PM      Result Value Ref Range Status   Specimen Description WOUND   Final   Special Requests LEFT POPITEAL EMBOLIUS PT ON ZINCEF   Final   Gram Stain PENDING   Incomplete   Culture     Final   Value: NO GROWTH 1 DAY     Performed at [facility]   Report Status PENDING   Incomplete  MRSA PCR SCREENING     Status: None   Collection Time    03/26/14  6:31 AM      Result Value Ref Range Status   MRSA by PCR NEGATIVE  NEGATIVE Final   Comment:            The GeneXpert MRSA Assay (FDA     approved for NASAL specimens     only), is one component of a     comprehensive MRSA colonization     surveillance program. It is not     intended to diagnose MRSA     infection nor to guide or     monitor treatment for     MRSA infections.    Medical History: Past Medical History  Diagnosis Date  . Hypertension   . Peripheral vascular disease   . Pneumonia 2014  . ESRD (end stage renal disease) on dialysis  [place],Dialysis- T,Th,S  . Acute on chronic diastolic heart failure 11/15/209  . Severe mitral regurgitation 03/23/2014    by TEE  . Positive blood culture 11/27/2013    MSSA  . Positive blood culture 02/24/2014    MSSA  . DVT of upper extremity (deep vein thrombosis) 03/04/2014    Right arm  . Knee pain, left 03/21/2014  . Acute combined systolic and diastolic congestive heart failure 03/21/2014  . Chronic diastolic congestive heart failure   . 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 Septic embolism to left lower extremity 03/25/2014    Medications:  Prescriptions prior to admission  Medication Sig Dispense Refill  . cinacalcet (SENSIPAR) 60 MG tablet Take 60 mg by mouth daily.      Marland Kitchen lisinopril (PRINIVIL,ZESTRIL) 20 MG tablet Take 20 mg by mouth daily.      Marland Kitchen oxyCODONE-acetaminophen (PERCOCET/ROXICET) 5-325 MG per tablet Take 1-2 tablets by mouth every 4 (four) hours as needed for severe pain.  20 tablet  0  . sevelamer carbonate (RENVELA) 800 MG tablet Take 800 mg by mouth 3 (three) times daily with meals.       Scheduled:  . sodium chloride   Intravenous Once  . amLODipine  10 mg Oral Daily  .  ceFAZolin (ANCEF) IV  2 g Intravenous Q T,Th,Sat-1800  . chlorhexidine  15 mL Mouth/Throat BID  . cinacalcet  60 mg Oral QPC supper  . darbepoetin (ARANESP) injection - DIALYSIS  40 mcg Intravenous Q Thu-HD  . feeding supplement (NEPRO CARB STEADY)  237 mL Oral BID BM  . heparin  5,000 Units Subcutaneous 3 times per day  . hydrALAZINE  37.5 mg Oral 3 times per day  . lisinopril  20 mg Oral Daily  . sevelamer carbonate  800 mg Oral TID WC  . sodium chloride  3 mL Intravenous Q12H  . sodium chloride  3 mL Intravenous Q12H    Assessment: 48 y.o male with PMH of HTN, ESRD on HD.  Initially treated with Vanc + Cefepime for r/o HCAP.  On PTA Ancef for MSSA bacteremia from cx at HD center.  TEE this admission showed MV vegetation - CVTS consulted and L/RHC planned for Monday 9/21.  ID recommended transition from Vanc + Cefepime to Ancef on 9/17 - dose appropriate for renal fxn and HD schedule.  Cefazolin PTA 2g/HD 8/20 >> (10/20) (MSSA bacteremia from HD cultures - no w/u done at the time) Vanc 9/16 >>9/17 Cefepime 9/16 >>9/17 Cefazolin 9/17>>  HD Center (May '15) >> MSSA HD Center (August '15) >> MSSA 9/15 BCx >> ngtd 9/15 Resp virus panel >>  neg 9/15 Influenza >> neg 9/18 wound cx >> ngtd  Pharmacy received consult to adjust antibiotic dose based on patient's renal function.  Plan:  1. Continue Ancer 2g after HD (T,Th,Sat) - dose ok 2. L/RHC planned Monday 9/21 - MV repair vs replacement 3. Pharmacy will sign off. Please consult again for further assistance.  Drucie Opitz, PharmD Clinical Pharmacy Resident Pager: [phone] 03/27/2014 1:04 PM

## 2014-03-27 NOTE — Progress Notes (Signed)
Pt refused to watch CCTV video.

## 2014-03-28 ENCOUNTER — Encounter (HOSPITAL_COMMUNITY): Payer: Self-pay | Admitting: Thoracic Surgery (Cardiothoracic Vascular Surgery)

## 2014-03-28 ENCOUNTER — Encounter (HOSPITAL_COMMUNITY)
Admission: EM | Disposition: A | Discharge status: Home / Self Care | Payer: Self-pay | Source: Home / Self Care | Attending: Internal Medicine

## 2014-03-28 DIAGNOSIS — I059 Rheumatic mitral valve disease, unspecified: Secondary | ICD-10-CM

## 2014-03-28 DIAGNOSIS — I509 Heart failure, unspecified: Secondary | ICD-10-CM

## 2014-03-28 DIAGNOSIS — R079 Chest pain, unspecified: Secondary | ICD-10-CM

## 2014-03-28 DIAGNOSIS — I279 Pulmonary heart disease, unspecified: Secondary | ICD-10-CM

## 2014-03-28 HISTORY — PX: LEFT AND RIGHT HEART CATHETERIZATION WITH CORONARY ANGIOGRAM: SHX5449

## 2014-03-28 LAB — POCT I-STAT 3, VENOUS BLOOD GAS (G3P V)
ACID-BASE EXCESS: 2 mmol/L (ref 0.0–2.0)
BICARBONATE: 27.2 meq/L — AB (ref 20.0–24.0)
O2 Saturation: 54 %
TCO2: 29 mmol/L (ref 0–100)
pCO2, Ven: 45.2 mmHg (ref 45.0–50.0)
pH, Ven: 7.388 — ABNORMAL HIGH (ref 7.250–7.300)
pO2, Ven: 29 mmHg — CL (ref 30.0–45.0)

## 2014-03-28 LAB — WOUND CULTURE
Culture: NO GROWTH
GRAM STAIN: NONE SEEN

## 2014-03-28 LAB — POCT I-STAT 3, ART BLOOD GAS (G3+)
ACID-BASE EXCESS: 1 mmol/L (ref 0.0–2.0)
BICARBONATE: 25.7 meq/L — AB (ref 20.0–24.0)
O2 Saturation: 92 %
PO2 ART: 62 mmHg — AB (ref 80.0–100.0)
TCO2: 27 mmol/L (ref 0–100)
pCO2 arterial: 38.9 mmHg (ref 35.0–45.0)
pH, Arterial: 7.428 (ref 7.350–7.450)

## 2014-03-28 LAB — CBC
HCT: 26.1 % — ABNORMAL LOW (ref 39.0–52.0)
Hemoglobin: 8.8 g/dL — ABNORMAL LOW (ref 13.0–17.0)
MCH: 30.6 pg (ref 26.0–34.0)
MCHC: 33.7 g/dL (ref 30.0–36.0)
MCV: 90.6 fL (ref 78.0–100.0)
PLATELETS: 243 10*3/uL (ref 150–400)
RBC: 2.88 MIL/uL — ABNORMAL LOW (ref 4.22–5.81)
RDW: 15.6 % — AB (ref 11.5–15.5)
WBC: 12.6 10*3/uL — AB (ref 4.0–10.5)

## 2014-03-28 LAB — RENAL FUNCTION PANEL
Albumin: 1.9 g/dL — ABNORMAL LOW (ref 3.5–5.2)
Anion gap: 16 — ABNORMAL HIGH (ref 5–15)
BUN: 31 mg/dL — ABNORMAL HIGH (ref 6–23)
CO2: 24 mEq/L (ref 19–32)
CREATININE: 9.12 mg/dL — AB (ref 0.50–1.35)
Calcium: 10.7 mg/dL — ABNORMAL HIGH (ref 8.4–10.5)
Chloride: 95 mEq/L — ABNORMAL LOW (ref 96–112)
GFR, EST AFRICAN AMERICAN: 7 mL/min — AB (ref >90)
GFR, EST NON AFRICAN AMERICAN: 6 mL/min — AB (ref >90)
Glucose, Bld: 106 mg/dL — ABNORMAL HIGH (ref 70–99)
PHOSPHORUS: 6.8 mg/dL — AB (ref 2.3–4.6)
Potassium: 3.9 mEq/L (ref 3.7–5.3)
SODIUM: 135 meq/L — AB (ref 137–147)

## 2014-03-28 LAB — CULTURE, BLOOD (ROUTINE X 2)
Culture: NO GROWTH
Culture: NO GROWTH

## 2014-03-28 SURGERY — LEFT AND RIGHT HEART CATHETERIZATION WITH CORONARY ANGIOGRAM
Anesthesia: LOCAL

## 2014-03-28 MED ORDER — SODIUM CHLORIDE 0.9 % IJ SOLN: 3.0000 mL | Freq: Two times a day (BID) | INTRAMUSCULAR | Status: DC

## 2014-03-28 MED ORDER — FENTANYL CITRATE 0.05 MG/ML IJ SOLN
25.0000 ug | INTRAMUSCULAR | Status: AC | PRN
  Administered 2014-03-28 (×2): 25 ug via INTRAVENOUS

## 2014-03-28 MED ORDER — NITROGLYCERIN 1 MG/10 ML FOR IR/CATH LAB
INTRA_ARTERIAL | Status: AC
  Filled 2014-03-28: qty 10

## 2014-03-28 MED ORDER — ONDANSETRON HCL 4 MG/2ML IJ SOLN: 4.0000 mg | Freq: Four times a day (QID) | INTRAMUSCULAR | Status: DC | PRN

## 2014-03-28 MED ORDER — LIDOCAINE HCL (PF) 1 % IJ SOLN
INTRAMUSCULAR | Status: AC
  Filled 2014-03-28: qty 30

## 2014-03-28 MED ORDER — PENTAFLUOROPROP-TETRAFLUOROETH EX AERO: 1.0000 "application " | INHALATION_SPRAY | CUTANEOUS | Status: DC | PRN

## 2014-03-28 MED ORDER — NEPRO/CARBSTEADY PO LIQD: 237.0000 mL | ORAL | Status: DC | PRN

## 2014-03-28 MED ORDER — HEPARIN SODIUM (PORCINE) 1000 UNIT/ML DIALYSIS: 1000.0000 [IU] | INTRAMUSCULAR | Status: DC | PRN

## 2014-03-28 MED ORDER — LIDOCAINE HCL (PF) 1 % IJ SOLN: 5.0000 mL | INTRAMUSCULAR | Status: DC | PRN

## 2014-03-28 MED ORDER — HEPARIN SODIUM (PORCINE) 5000 UNIT/ML IJ SOLN
5000.0000 [IU] | Freq: Three times a day (TID) | INTRAMUSCULAR | Status: DC
  Administered 2014-03-28 – 2014-04-03 (×19): 5000 [IU] via SUBCUTANEOUS
  Filled 2014-03-28 (×22): qty 1

## 2014-03-28 MED ORDER — ACETAMINOPHEN 325 MG PO TABS
650.0000 mg | ORAL_TABLET | ORAL | Status: DC | PRN
  Administered 2014-03-29: 650 mg via ORAL
  Filled 2014-03-28: qty 2

## 2014-03-28 MED ORDER — HEPARIN SODIUM (PORCINE) 1000 UNIT/ML DIALYSIS
1000.0000 [IU] | INTRAMUSCULAR | Status: DC | PRN
  Filled 2014-03-28: qty 1

## 2014-03-28 MED ORDER — LIDOCAINE-PRILOCAINE 2.5-2.5 % EX CREA
1.0000 "application " | TOPICAL_CREAM | CUTANEOUS | Status: DC | PRN
  Filled 2014-03-28: qty 5

## 2014-03-28 MED ORDER — MIDAZOLAM HCL 2 MG/2ML IJ SOLN
INTRAMUSCULAR | Status: AC
  Filled 2014-03-28: qty 2

## 2014-03-28 MED ORDER — FENTANYL CITRATE 0.05 MG/ML IJ SOLN
INTRAMUSCULAR | Status: AC
  Administered 2014-03-28: 25 ug via INTRAVENOUS
  Filled 2014-03-28: qty 2

## 2014-03-28 MED ORDER — SODIUM CHLORIDE 0.9 % IV SOLN: 100.0000 mL | INTRAVENOUS | Status: DC | PRN

## 2014-03-28 MED ORDER — AMLODIPINE BESYLATE 10 MG PO TABS
10.0000 mg | ORAL_TABLET | Freq: Every evening | ORAL | Status: DC
  Filled 2014-03-28 (×2): qty 1

## 2014-03-28 MED ORDER — AMIODARONE HCL 200 MG PO TABS: 200.0000 mg | ORAL_TABLET | Freq: Two times a day (BID) | ORAL | Status: DC

## 2014-03-28 MED ORDER — ALTEPLASE 2 MG IJ SOLR: 2.0000 mg | Freq: Once | INTRAMUSCULAR | Status: DC | PRN

## 2014-03-28 MED ORDER — HEPARIN SODIUM (PORCINE) 1000 UNIT/ML DIALYSIS: 4000.0000 [IU] | INTRAMUSCULAR | Status: DC | PRN

## 2014-03-28 MED ORDER — NEPRO/CARBSTEADY PO LIQD
237.0000 mL | ORAL | Status: DC | PRN
  Filled 2014-03-28: qty 237

## 2014-03-28 MED ORDER — ALTEPLASE 2 MG IJ SOLR
2.0000 mg | Freq: Once | INTRAMUSCULAR | Status: DC | PRN
  Filled 2014-03-28: qty 2

## 2014-03-28 MED ORDER — HEPARIN (PORCINE) IN NACL 2-0.9 UNIT/ML-% IJ SOLN
INTRAMUSCULAR | Status: AC
  Filled 2014-03-28: qty 1000

## 2014-03-28 MED ORDER — SODIUM CHLORIDE 0.9 % IJ SOLN: 3.0000 mL | INTRAMUSCULAR | Status: DC | PRN

## 2014-03-28 MED ORDER — FENTANYL CITRATE 0.05 MG/ML IJ SOLN
INTRAMUSCULAR | Status: AC
  Filled 2014-03-28: qty 2

## 2014-03-28 MED ORDER — HEPARIN SODIUM (PORCINE) 1000 UNIT/ML DIALYSIS: 3500.0000 [IU] | INTRAMUSCULAR | Status: DC | PRN

## 2014-03-28 MED ORDER — HYDRALAZINE HCL 50 MG PO TABS
50.0000 mg | ORAL_TABLET | Freq: Three times a day (TID) | ORAL | Status: DC
  Administered 2014-03-28 – 2014-03-29 (×2): 50 mg via ORAL
  Filled 2014-03-28 (×6): qty 1

## 2014-03-28 MED ORDER — AMLODIPINE BESYLATE 5 MG PO TABS: 5.0000 mg | ORAL_TABLET | Freq: Every evening | ORAL | Status: DC

## 2014-03-28 MED ORDER — LIDOCAINE-PRILOCAINE 2.5-2.5 % EX CREA: 1.0000 "application " | TOPICAL_CREAM | CUTANEOUS | Status: DC | PRN

## 2014-03-28 MED ORDER — SODIUM CHLORIDE 0.9 % IV SOLN
250.0000 mL | INTRAVENOUS | Status: DC | PRN
Start: 2014-03-28 — End: 2014-03-28

## 2014-03-28 NOTE — Progress Notes (Addendum)
Site area: right groin 77fr arterial, and a 7fr venous sheath  Site Prior to Removal:  Level 0  Pressure Applied For 20 MINUTES    Minutes Beginning at 0930am  Manual:   Yes.    Patient Status During Pull:  stable  Post Pull Groin Site:  Level 0  Post Pull Instructions Given:  Yes.    Post Pull Pulses Present:  Yes.    Dressing Applied:  Yes.    Comments:  VS remain stable during sheath pull.  Pain med Fentanyl effective for chronic left leg pain.  Pain level 1.

## 2014-03-28 NOTE — Progress Notes (Addendum)
  Progress Note    03/28/2014 7:42 AM 3 Days Post-Op  Subjective:  No complaints with exception of a little soreness.  Afebrile VSS 100% 2LO2NC  Filed Vitals:   03/28/14 0330  BP: 138/91  Pulse: 87  Temp: 97.6 F (36.4 C)  Resp: 24    Physical Exam: Lungs:  Non labored Incisions:  All are c/d/i with steri strips in place Extremities:  2+ palpable left DP; left foot is warm.  CBC    Component Value Date/Time   WBC 12.6* 03/28/2014 0340   RBC 2.88* 03/28/2014 0340   HGB 8.8* 03/28/2014 0340   HCT 26.1* 03/28/2014 0340   PLT 243 03/28/2014 0340   MCV 90.6 03/28/2014 0340   MCH 30.6 03/28/2014 0340   MCHC 33.7 03/28/2014 0340   RDW 15.6* 03/28/2014 0340   LYMPHSABS 2.4 09/12/2012 1445   MONOABS 1.1* 09/12/2012 1445   EOSABS 0.1 09/12/2012 1445   BASOSABS 0.0 09/12/2012 1445    BMET    Component Value Date/Time   NA 135* 03/28/2014 0340   K 3.9 03/28/2014 0340   CL 95* 03/28/2014 0340   CO2 24 03/28/2014 0340   GLUCOSE 106* 03/28/2014 0340   BUN 31* 03/28/2014 0340   CREATININE 9.12* 03/28/2014 0340   CALCIUM 10.7* 03/28/2014 0340   GFRNONAA 6* 03/28/2014 0340   GFRAA 7* 03/28/2014 0340    INR    Component Value Date/Time   INR 1.19 03/27/2014 1905     Intake/Output Summary (Last 24 hours) at 03/28/14 0742 Last data filed at 03/27/14 2340  Gross per 24 hour  Intake    480 ml  Output    200 ml  Net    280 ml     Assessment:  48 y.o. male is s/p:  Left popliteal embolectomy and intraoperative arteriogram & Above-knee to below-knee popliteal bypass with reversed great saphenous vein   3 Days Post-Op  Plan: -pt doing well from surgical standpoint.   -he is going for heart cath this am -DVT prophylaxis:  Heparin SQ   Leontine Locket, PA-C [facility] [phone] 03/28/2014 7:42 AM

## 2014-03-28 NOTE — Progress Notes (Signed)
Utilization review completed.  

## 2014-03-28 NOTE — Progress Notes (Signed)
  Date: 03/28/2014  Patient name: Timothy Palmer  Medical record number: [id number]  Date of birth: 1965/12/05   This patient has been seen and the plan of care was discussed with the house staff. Please see their note for complete details. I concur with their findings with the following additions/corrections: Mr Ravi is back from his cath - showed nl vessels but severe MR with severe pulmonary HTN. Hydralazine was increased for afterload reduction. HD for volume removal. CVTS to decide on valve surgery.    Bartholomew Crews, MD 03/28/2014, 3:17 PM

## 2014-03-28 NOTE — H&P (View-Only) (Signed)
Subjective: No CP  Breathing is fair   Objective: Filed Vitals:   03/26/14 2345 03/27/14 0414 03/27/14 0458 03/27/14 0723  BP: 126/73 141/81  129/81  Pulse: 91 93  95  Temp: 98.3 F (36.8 C) 98.2 F (36.8 C)  97.6 F (36.4 C)  TempSrc: Oral Oral  Oral  Resp: 25 26  33  Height:      Weight:   230 lb 9.6 oz (104.6 kg)   SpO2: 97% 100%  100%   Weight change: 9 lb 11.2 oz (4.4 kg)  Intake/Output Summary (Last 24 hours) at 03/27/14 1108 Last data filed at 03/27/14 0000  Gross per 24 hour  Intake    570 ml  Output   1000 ml  Net   -430 ml    General: Alert, awake, oriented x3, in no acute distress Neck:  JVP is normal Heart: Regular rate and rhythm  Gr III/VI systolic murmur at apex Lungs: Clear to auscultation.  No rales or wheezes. Exemities:  1+ edema.   Neuro: Grossly intact, nonfocal.   Lab Results: Results for orders placed during the hospital encounter of 03/22/14 (from the past 24 hour(s))  CBC     Status: Abnormal   Collection Time    03/26/14  1:16 PM      Result Value Ref Range   WBC 12.0 (*) 4.0 - 10.5 K/uL   RBC 2.31 (*) 4.22 - 5.81 MIL/uL   Hemoglobin 7.0 (*) 13.0 - 17.0 g/dL   HCT 21.4 (*) 39.0 - 52.0 %   MCV 92.6  78.0 - 100.0 fL   MCH 30.3  26.0 - 34.0 pg   MCHC 32.7  30.0 - 36.0 g/dL   RDW 15.4  11.5 - 15.5 %   Platelets 257  150 - 400 K/uL  TYPE AND SCREEN     Status: None   Collection Time    03/26/14  2:30 PM      Result Value Ref Range   ABO/RH(D) B POS     Antibody Screen NEG     Sample Expiration 03/29/2014     Unit Number [id number]     Blood Component Type RED CELLS,LR     Unit division 00     Status of Unit ISSUED,FINAL     Transfusion Status OK TO TRANSFUSE     Crossmatch Result Compatible     Unit Number [id number]     Blood Component Type RED CELLS,LR     Unit division 00     Status of Unit ISSUED,FINAL     Transfusion Status OK TO TRANSFUSE     Crossmatch Result Compatible    PREPARE RBC (CROSSMATCH)     Status:  None   Collection Time    03/26/14  2:30 PM      Result Value Ref Range   Order Confirmation ORDER PROCESSED BY BLOOD BANK    ABO/RH     Status: None   Collection Time    03/26/14  2:30 PM      Result Value Ref Range   ABO/RH(D) B POS    CBC     Status: Abnormal   Collection Time    03/26/14 11:47 PM      Result Value Ref Range   WBC 12.8 (*) 4.0 - 10.5 K/uL   RBC 3.00 (*) 4.22 - 5.81 MIL/uL   Hemoglobin 9.0 (*) 13.0 - 17.0 g/dL   HCT 27.2 (*) 39.0 - 52.0 %   MCV 90.7  78.0 - 100.0 fL   MCH 30.0  26.0 - 34.0 pg   MCHC 33.1  30.0 - 36.0 g/dL   RDW 15.6 (*) 11.5 - 15.5 %   Platelets 230  150 - 400 K/uL  PROCALCITONIN     Status: None   Collection Time    03/27/14 12:03 AM      Result Value Ref Range   Procalcitonin 7.06      Studies/Results: No results found.  Medications:  Reviewed   @PROBHOSP @  1.  Endocarditis.  Plan for R and L heart cath tomorrow.  Continue current regimen  2.  Acute distolic CHF MR  Continue dialysis  .  LOS: 5 days   Dorris Carnes 03/27/2014, 11:08 AM

## 2014-03-28 NOTE — Anesthesia Postprocedure Evaluation (Signed)
  Anesthesia Post-op Note  Patient: Timothy Palmer  Procedure(s) Performed: Procedure(s): EMBOLECTOMY left popliteal (Left)  Patient Location: PACU  Anesthesia Type:General  Level of Consciousness: awake, alert  and oriented  Airway and Oxygen Therapy: Patient Spontanous Breathing  Post-op Pain: mild  Post-op Assessment: Post-op Vital signs reviewed  Post-op Vital Signs: Reviewed  Last Vitals:  Filed Vitals:   03/28/14 1255  BP: 146/85  Pulse: 88  Temp:   Resp: 23    Complications: No apparent anesthesia complications

## 2014-03-28 NOTE — Interval H&P Note (Signed)
History and Physical Interval Note:  03/28/2014 8:09 AM  Timothy Palmer  has presented today for surgery, with the diagnosis of cp  The various methods of treatment have been discussed with the patient and family. After consideration of risks, benefits and other options for treatment, the patient has consented to  Procedure(s): LEFT AND RIGHT HEART CATHETERIZATION WITH CORONARY ANGIOGRAM (N/A) as a surgical intervention .  The patient's history has been reviewed, patient examined, no change in status, stable for surgery.  I have reviewed the patient's chart and labs.  Questions were answered to the patient's satisfaction.     Dalton [employer]

## 2014-03-28 NOTE — Progress Notes (Signed)
INFECTIOUS DISEASE PROGRESS NOTE  ID: Timothy Palmer is a 48 y.o. 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, severe mitral regurgitation, and septic embolization Active Problems:   End stage renal disease   Hypertension   Acute bronchitis   T wave inversion in EKG   QT prolongation   Mitral valve vegetation   Acute combined systolic and diastolic congestive heart failure   Severe mitral regurgitation   Positive blood culture   Knee pain, left   Chronic diastolic congestive heart failure   Septic embolism to left lower extremity  Subjective: Without complaints  Abtx:  Anti-infectives   Start     Dose/Rate Route Frequency Ordered Stop   03/26/14 0200  cefUROXime (ZINACEF) 1.5 g in dextrose 5 % 50 mL IVPB     1.5 g 100 mL/hr over 30 Minutes Intravenous Every 12 hours 03/25/14 2233 03/26/14 1014   03/25/14 1300  cefUROXime (ZINACEF) 1.5 g in dextrose 5 % 50 mL IVPB     1.5 g 100 mL/hr over 30 Minutes Intravenous On call to O.R. 03/25/14 1249 03/26/14 0559   03/24/14 1800  ceFEPIme (MAXIPIME) 2 g in dextrose 5 % 50 mL IVPB  Status:  Discontinued     2 g 100 mL/hr over 30 Minutes Intravenous Every T-Th-Sa (1800) 03/23/14 1108 03/24/14 1505   03/24/14 1800  ceFAZolin (ANCEF) IVPB 2 g/50 mL premix     2 g 100 mL/hr over 30 Minutes Intravenous Every T-Th-Sa (1800) 03/24/14 1505     03/24/14 1200  ceFEPIme (MAXIPIME) 2 g in dextrose 5 % 50 mL IVPB  Status:  Discontinued     2 g 100 mL/hr over 30 Minutes Intravenous Every T-Th-Sa (Hemodialysis) 03/22/14 1355 03/23/14 1108   03/24/14 1200  vancomycin (VANCOCIN) IVPB 1000 mg/200 mL premix  Status:  Discontinued     1,000 mg 200 mL/hr over 60 Minutes Intravenous Every T-Th-Sa (Hemodialysis) 03/22/14 2232 03/24/14 1505   03/22/14 1400  vancomycin (VANCOCIN) 1,750 mg in sodium chloride 0.9 % 500 mL IVPB     1,750 mg 250 mL/hr over 120 Minutes Intravenous  Once  03/22/14 1346 03/22/14 1720   03/22/14 1400  ceFEPIme (MAXIPIME) 2 g in dextrose 5 % 50 mL IVPB     2 g 100 mL/hr over 30 Minutes Intravenous  Once 03/22/14 1347 03/22/14 1521   03/22/14 1345  vancomycin (VANCOCIN) 15 mg/kg in sodium chloride 0.9 % 100 mL IVPB  Status:  Discontinued     15 mg/kg 100 mL/hr over 60 Minutes Intravenous  Once 03/22/14 1342 03/22/14 1345      Medications:  Scheduled: . sodium chloride   Intravenous Once  . amLODipine  10 mg Oral Daily  .  ceFAZolin (ANCEF) IV  2 g Intravenous Q T,Th,Sat-1800  . chlorhexidine  15 mL Mouth/Throat BID  . cinacalcet  60 mg Oral QPC supper  . darbepoetin (ARANESP) injection - DIALYSIS  40 mcg Intravenous Q Thu-HD  . feeding supplement (NEPRO CARB STEADY)  237 mL Oral BID BM  . heparin  5,000 Units Subcutaneous 3 times per day  . hydrALAZINE  50 mg Oral 3 times per day  . lisinopril  20 mg Oral Daily  . sevelamer carbonate  800 mg Oral TID WC  . sodium chloride  3 mL Intravenous Q12H  . sodium chloride  3 mL Intravenous Q12H  . sodium chloride  3 mL Intravenous Q12H  Objective: Vital signs in last 24 hours: Temp:  [97.6 F (36.4 C)-98.8 F (37.1 C)] 97.7 F (36.5 C) (09/21 1045) Pulse Rate:  [83-95] 88 (09/21 1046) Resp:  [20-29] 28 (09/21 1046) BP: (132-174)/(70-102) 153/82 mmHg (09/21 1046) SpO2:  [90 %-100 %] 98 % (09/21 1046) Weight:  [104.7 kg (230 lb 13.2 oz)] 104.7 kg (230 lb 13.2 oz) (09/21 0500)   General appearance: alert, cooperative and no distress Resp: clear to auscultation bilaterally Cardio: regular rate and rhythm GI: normal findings: bowel sounds normal and soft, non-tender  Lab Results  Recent Labs  03/27/14 1457 03/28/14 0340  WBC 13.0* 12.6*  HGB 8.8* 8.8*  HCT 26.2* 26.1*  NA 138 135*  K 4.0 3.9  CL 97 95*  CO2 28 24  BUN 27* 31*  CREATININE 7.77* 9.12*   Liver Panel  Recent Labs  03/26/14 0340 03/28/14 0340  ALBUMIN 2.1* 1.9*   Sedimentation Rate No results found  for this basename: ESRSEDRATE,  in the last 72 hours C-Reactive Protein No results found for this basename: CRP,  in the last 72 hours  Microbiology: Recent Results (from the past 240 hour(s))  CULTURE, BLOOD (ROUTINE X 2)     Status: None   Collection Time    03/22/14  1:52 PM      Result Value Ref Range Status   Specimen Description BLOOD ARM RIGHT   Final   Special Requests BOTTLES DRAWN AEROBIC ONLY 10CC   Final   Culture  Setup Time     Final   Value: 03/22/2014 18:55     Performed at [facility]   Culture     Final   Value: NO GROWTH 5 DAYS     Performed at [facility]   Report Status 03/28/2014 FINAL   Final  CULTURE, BLOOD (ROUTINE X 2)     Status: None   Collection Time    03/22/14  2:00 PM      Result Value Ref Range Status   Specimen Description BLOOD HAND RIGHT   Final   Special Requests BOTTLES DRAWN AEROBIC AND ANAEROBIC 10CC   Final   Culture  Setup Time     Final   Value: 03/22/2014 18:56     Performed at [facility]   Culture     Final   Value: NO GROWTH 5 DAYS     Performed at [facility]   Report Status 03/28/2014 FINAL   Final  RESPIRATORY VIRUS PANEL     Status: None   Collection Time    03/22/14  9:22 PM      Result Value Ref Range Status   Source - RVPAN NASOPHARYNGEAL   Final   Respiratory Syncytial Virus A NOT DETECTED   Final   Respiratory Syncytial Virus B NOT DETECTED   Final   Influenza A NOT DETECTED   Final   Influenza B NOT DETECTED   Final   Parainfluenza 1 NOT DETECTED   Final   Parainfluenza 2 NOT DETECTED   Final   Parainfluenza 3 NOT DETECTED   Final   Metapneumovirus NOT DETECTED   Final   Rhinovirus NOT DETECTED   Final   Adenovirus NOT DETECTED   Final   Influenza A H1 NOT DETECTED   Final   Influenza A H3 NOT DETECTED   Final   Comment: (NOTE)           Normal Reference Range for each Analyte: NOT DETECTED  Testing performed using the Luminex xTAG Respiratory Viral Panel test     kit.       The analytical performance characteristics of this assay have been     determined by [facility].  The modifications have not been     cleared or approved by the FDA. This assay has been validated pursuant     to the CLIA regulations and is used for clinical purposes.     Performed at [place], BLOOD (ROUTINE X 2)     Status: None   Collection Time    03/23/14  7:20 PM      Result Value Ref Range Status   Specimen Description BLOOD RIGHT HAND   Final   Special Requests BOTTLES DRAWN AEROBIC AND ANAEROBIC 5CC   Final   Culture  Setup Time     Final   Value: 03/23/2014 22:25     Performed at [facility]   Culture     Final   Value:        BLOOD CULTURE RECEIVED NO GROWTH TO DATE CULTURE WILL BE HELD FOR 5 DAYS BEFORE ISSUING A FINAL NEGATIVE REPORT     Performed at [facility]   Report Status PENDING   Incomplete  ANAEROBIC CULTURE     Status: None   Collection Time    03/25/14  3:21 PM      Result Value Ref Range Status   Specimen Description WOUND   Final   Special Requests LEFT POPITEAL EMBOLIUS PT ON ZINACEF   Final   Gram Stain PENDING   Incomplete   Culture     Final   Value: NO ANAEROBES ISOLATED; CULTURE IN PROGRESS FOR 5 DAYS     Performed at [facility]   Report Status PENDING   Incomplete  WOUND CULTURE     Status: None   Collection Time    03/25/14  3:21 PM      Result Value Ref Range Status   Specimen Description WOUND   Final   Special Requests LEFT POPITEAL EMBOLIUS PT ON ZINCEF   Final   Gram Stain PENDING   Incomplete   Culture     Final   Value: NO GROWTH 2 DAYS     Performed at [facility]   Report Status PENDING   Incomplete  MRSA PCR SCREENING     Status: None   Collection Time    03/26/14  6:31 AM      Result Value Ref Range Status   MRSA by PCR NEGATIVE  NEGATIVE Final   Comment:            The GeneXpert MRSA Assay (FDA     approved for NASAL specimens     only), is one  component of a     comprehensive MRSA colonization     surveillance program. It is not     intended to diagnose MRSA     infection nor to guide or     monitor treatment for     MRSA infections.    Studies/Results: No results found.   Assessment/Plan: MV Endocarditis  Staph bacteremia (May 2015, August 2015)  ESRD   Total days of antibiotics: 5 vanco/cefepime ---> ancef TEE: MVR IE with severe MR   CVTS eval in process- cath no angiographic coronary disease Repeat BCx are all ngtd  Would plan for 6 weeks of ancef with start date based on his date of  MVR        Bobby Rumpf Infectious Diseases (pager) [phone] [email or web address] 03/28/2014, 11:49 AM  LOS: 6 days

## 2014-03-28 NOTE — Progress Notes (Addendum)
[address]       [place] [address]             [phone]     CARDIOTHORACIC SURGERY PROGRESS NOTE  Day of Surgery  S/P Procedure(s) (LRB): LEFT AND RIGHT HEART CATHETERIZATION WITH CORONARY ANGIOGRAM (N/A)  Subjective: Patient feels a little better.  Breathing improved since admission.  Left knee pain improved.  Ambulated some yesterday w/out much difficulty.  No abdominal pain.  Objective: Vital signs in last 24 hours: Temp:  [97.6 F (36.4 C)-98.5 F (36.9 C)] 98 F (36.7 C) (09/21 1655) Pulse Rate:  [83-95] 83 (09/21 1700) Cardiac Rhythm:  [-] Normal sinus rhythm (09/21 1721) Resp:  [20-34] 22 (09/21 1655) BP: (130-162)/(70-102) 146/88 mmHg (09/21 1700) SpO2:  [90 %-100 %] 100 % (09/21 1700) Weight:  [104.7 kg (230 lb 13.2 oz)-105.1 kg (231 lb 11.3 oz)] 105.1 kg (231 lb 11.3 oz) (09/21 1659)  Physical Exam:  Rhythm:   sinus  Breath sounds: Few crackles  Heart sounds:  RRR w/ holosystolic murmur  Abdomen:  Soft, non-distended, non-tender  Extremities:  Warm, well-perfused    Intake/Output from previous day: 09/20 0701 - 09/21 0700 In: 480 [P.O.:480] Out: 200 [Urine:200] Intake/Output this shift: Total I/O In: 200 [P.O.:200] Out: 50 [Urine:50]  Lab Results:  Recent Labs  03/27/14 1457 03/28/14 0340  WBC 13.0* 12.6*  HGB 8.8* 8.8*  HCT 26.2* 26.1*  PLT 229 243   BMET:  Recent Labs  03/27/14 1457 03/28/14 0340  NA 138 135*  K 4.0 3.9  CL 97 95*  CO2 28 24  GLUCOSE 145* 106*  BUN 27* 31*  CREATININE 7.77* 9.12*  CALCIUM 10.8* 10.7*    CBG (last 3)  No results found for this basename: GLUCAP,  in the last 72 hours PT/INR:   Recent Labs  03/27/14 1905  LABPROT 15.1  INR 1.19    CXR:  N/A  CT ANGIOGRAPHY OF ABDOMINAL AORTA WITH ILIOFEMORAL RUNOFF  TECHNIQUE:  Multidetector CT imaging of the abdomen, pelvis and lower  extremities was performed using the standard protocol during bolus  administration of  intravenous contrast. Multiplanar CT image  reconstructions and MIPs were obtained to evaluate the vascular  anatomy.  CONTRAST: 175mL OMNIPAQUE IOHEXOL 350 MG/ML SOLN  COMPARISON: CT chest 03/22/2014 ; radiographs of the left knee  03/23/2014  FINDINGS:  VASCULAR  Aorta: Normal caliber aorta. No significant atherosclerotic plaque  or irregular wall thickening. No inflammation common dissection or  aneurysmal dilatation.  Celiac: Widely patent. Conventional hepatic arterial anatomy. No  evidence of mycotic or other visceral artery aneurysm.  SMA: Widely patent. There is a focal fusiform aneurysm with mild  hazy arterial wall thickening arising from a distal ileal branch of  the SMA beyond the ileocecal branches to the right of midline in the  mid to lower abdomen. Aneurysm measures 1.4 x 1.0 cm in diameter  which is significantly larger than the 4.4 mm parent vessel.  Renals: Abhorrent right renal artery anatomy. There is a small  retrocaval accessory renal artery to the upper pole. The dominant  renal artery originates low on the aorta beyond the origin of the  IMA. On the left, there is a single dominant renal artery and  typical anatomic position. Mild focal heterogeneous atherosclerotic  plaque versus focal changes of fibromuscular dysplasia results in at  least moderate stenosis of the proximal left renal artery.  Evaluation is slightly limited by focal motion artifact.  The 2  right-sided renal arteries appear patent.  IMA: Widely patent and unremarkable.  RIGHT Lower Extremity  Inflow: Widely patent and unremarkable comment, internal and  external iliac arteries. Trace plaque along the distal common iliac  artery without stenosis.  Outflow: The common, profunda wall and superficial femoral arteries  are relatively disease free and widely patent. No evidence of  pathology. Calcified atherosclerotic plaque is noted in the above  the knee popliteal artery resulting in moderate  focal stenosis.  Remainder the popliteal artery is widely patent.  Runoff: Mild multifocal narrowing of the posterior tibial artery.  Three vessel runoff patent to the ankle.  LEFT lower Extremity  Inflow: Widely patent common, internal and external iliac arteries.  Outflow: The common, profunda and superficial femoral arteries are  widely patent. There is trace atherosclerotic plaque in the  superficial femoral artery distribution without significant  stenosis. The entire 5.5 cm length of the left popliteal artery is  abnormal. The vessel is nearly occluded proximally with a linear  defects which can best be described as a focal dissection. There is  irregular inflammatory stranding surrounding the vessel involving  the lateral head of the gastrocnemius muscle in the  intracompartmental fat within the upper calf. The arterial wall is  thickened and poorly defined. The mid segment of the vessel at the  level of the knee is diffusely irregular and heterogeneous with  intermittent contrast opacification in the lumen. There is a short  segment complete occlusion secondary to intraluminal filling defect  which then extends distally as a semi occlusive thrombus to the  level of the tibial plateau. Below the knee, the popliteal artery  regains patency just proximal to the trifurcation.  Runoff: Slightly aberrant trifurcation anatomy. The posterior tibial  artery arises proximally hand and there is a anterior  tibial-peroneal trunk. The runoff vessels remain patent to the  ankle.  Veins: No focal venous abnormality.  Review of the MIP images confirms the above findings.  NON-VASCULAR  Lower Chest: Incidentally noted gynecomastia. Small right pleural  effusion. Calcified right pleural plaque. Combination dependent  atelectasis and patchy consolidation in the lower lobes. Trace  atherosclerotic calcifications in the coronary arteries. Borderline  cardiomegaly. No pericardial effusion.  Unremarkable thoracic  esophagus.  Abdomen: Unremarkable appearance of the stomach, duodenum, and  adrenal glands. There is an intermediate attenuation 4.1 x 4.1 by  2.6 cm lesion in the superior pole of the spleen concerning for  possible septic abscess. Normal hepatic contour and configuration.  No discrete hepatic lesion. Multiple small stones within the  gallbladder lumen and neck. No secondary signs to suggest acute  cholecystitis. The portal veins remain patent.  The bilateral kidneys are nearly entirely replaced with cysts. The  kidneys are also slightly enlarged. No definite enhancing renal  lesion. Evaluation is limited by mild motion artifact. Colonic  diverticular disease without CT evidence of active inflammation. No  evidence of obstruction or focal bowel wall thickening. Normal  appendix in the right lower quadrant. The terminal ileum is  unremarkable. No free fluid or suspicious adenopathy. There is a 1.1  cm peripherally calcified structure within the small bowel  mesenteric left of midline which measures 1.1 cm in diameter (image  105 series 5). This structure abuts the mesenteric border of the  adjacent small bowel. No surrounding desmoplastic change or  inflammation.  Pelvis: Abnormal left external iliac station lymph nodes. Although  not enlarged by CT criteria (7 and 5 mm in short axis) there  is  interstitial inflammatory stranding in the perinodal fat. These are  concerning for reactive adenopathy versus adenitis given the  patient's known clinical history. No necrosis or abscess. Under  distended but diffusely thick-walled bladder. Unremarkable prostate  and seminal vesicles. Calcification of the bilateral vas deferens.  No free fluid.  Bones/Soft Tissues: No acute fracture or aggressive appearing lytic  or blastic osseous lesion. Transitional anatomy with sacralization  of L5. Focal degenerative disc disease at L4-L5 with vacuum disc  phenomenon. Localized  inflammatory change of the deeper muscular  fascia overlying the vastus lateralis muscle in the left thigh.  IMPRESSION:  VASCULAR  1. Diffusely abnormal left popliteal artery. The findings most  likely represent a combination of septic embolus, mycotic arteritis  and dissection. The entire 5.5 cm length of the left popliteal  artery is abnormal with dissection proximally in the above the knee  popliteal artery, focal complete complete occlusion at the level of  the joint and partially occluding septic embolus extending to the  tibial plateau. There is surrounding inflammatory change involving  the deep intermuscular fat of the upper calf and the lateral head of  the gastrocnemius muscle.  2. 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 The aneurysm measures 1.4 x 1.1 cm which is  significantly larger than the 4.5 mm parent vessel. No evidence of  downstream occlusion or small bowel ischemia.  3. Aberrant left trifurcation anatomy. The posterior tibial artery  arises proximally bleeding in anterior tibial - peroneal trunk which  then bifurcates. Three vessel runoff remains patent to the ankle.  4. Focal moderate stenosis of the proximal left renal artery.  Secondary to motion artifact is unclear if this represents focal  fibro fatty atherosclerotic plaque or focal changes of FMD.  5. Aberrant right renal artery anatomy as described above without  stenosis or FMD.  NON VASCULAR  1. Probable 4.1 cm septic abscess in the upper pole of the spleen.  Additional differential considerations include splenic hemangioma,  hammertoe mal or complex cyst although these are all considered  significantly less likely given the patient's overall clinical  picture.  2. Left external iliac station adenitis, likely reactive and related  to the patient's multifocal septic emboli.  3. Bilateral renal enlargement with innumerable cysts of varying  complexity.  Differential considerations include dialysis associated  acquired cystic kidney disease and autosomal dominant polycystic  kidney disease. The former is favored.  4. 1.1 cm peripherally calcified structure within the small bowel  mesenteric fat adjacent to a loop of distal jejunum/ileum left of  midline without evidence of surrounding desmoplastic change. This  finding is nonspecific and may represent sequelae of old  granulomatous disease, a peripherally calcified duplication cyst, or  the residua of prior fat necrosis. Carcinoid or desmoid tumor is  considered significantly less likely given the complete absence of  surrounding desmoplastic change. Recommend followup CT scan of the  abdomen and pelvis in 1 year to confirm stability.  5. Under distended but diffusely thick-walled bladder. Recommend  clinical correlation for signs and symptoms of cystitis.  6. Cholelithiasis without secondary signs to suggest acute  cholecystitis.  7. Scattered colonic diverticula without evidence of active  diverticulitis.  8. Transitional L5 anatomy with associated focal L4-L5 degenerative  disc disease.  Signed,  Criselda Peaches, MD  [facility]  [facility]  Electronically Signed:  By: Jacqulynn Cadet M.D.  On: 03/25/2014 08:35    MRI  HEAD WITHOUT CONTRAST  TECHNIQUE:  Multiplanar, multiecho pulse sequences of the brain and surrounding  structures were obtained without intravenous contrast.  COMPARISON: None.  FINDINGS:  There is no evidence of acute infarct, intracranial hemorrhage,  mass, midline shift, or extra-axial fluid collection. Ventricles and  sulci are within normal limits for age. A few punctate foci of T2  hyperintensity in the white matter of the frontal lobes are  nonspecific and likely within normal limits for age.  Orbits are unremarkable. Paranasal sinuses and mastoid air cells are  clear. Major intracranial vascular  flow voids are preserved.  IMPRESSION:  No acute infarct. Unremarkable appearance of the brain for age.  Electronically Signed  By: Logan Bores  On: 03/25/2014 10:20    MRI OF THE LEFT KNEE WITHOUT CONTRAST  TECHNIQUE:  Multiplanar, multisequence MR imaging of the knee was performed. No  intravenous contrast was administered.  COMPARISON: None.  FINDINGS:  Despite efforts by the technologist and patient, motion artifact is  present on today's exam and could not be eliminated. This reduces  exam sensitivity and specificity.  MENISCI  Medial meniscus: Intact  Lateral meniscus: Intact  LIGAMENTS  Cruciates: Intact  Collaterals: Intact  CARTILAGE  Patellofemoral: Intact  Medial: Intact  Lateral: Intact  Joint: Small knee effusion.  Popliteal Fossa: Abnormal infiltrative edema in the popliteal fossa  abnormal edema in the distal biceps femorals muscle. Plantaris  tendon seems intact. 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 The medial head gastrocnemius is also edematous. Small  Baker's cyst. Proximal soleus muscle intact.  Extensor Mechanism: Proximal patellar partial tear potentially with  a small partial avulsion from the inferior pole of the patella.  Bones: Intact except as noted above.  IMPRESSION:  1. Muscle injury posteriorly with abnormal edema in the biceps  femoris, medial head gastrocnemius, and lateral head gastrocnemius.  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  2. Proximal patellar tendon partial tear with a small partially  avulsion from the inferior pole of the patella.  3. Small knee effusion. Small Baker's cyst.  Electronically Signed  By: Sherryl Barters M.D.  On: 03/25/2014 10:45   Cardiac Catheterization Procedure Note  Name: Timothy Palmer  MRN: [id number]  DOB: 1965-12-23  Procedure: Right  Heart Cath, Left Heart Cath, Selective Coronary Angiography  Indication: Pre-MV surgery, endocarditis.  Procedural Details: The right groin was prepped, draped, and anesthetized with 1% lidocaine. Using the modified Seldinger technique a 5 French sheath was placed in the right femoral artery and a 7 French sheath was placed in the right femoral vein. A Swan-Ganz catheter was used for the right heart catheterization. Standard protocol was followed for recording of right heart pressures and sampling of oxygen saturations. Fick cardiac output was calculated. Standard Judkins catheters were used for selective coronary angiography. There were no immediate procedural complications. The patient was transferred to the post catheterization recovery area for further monitoring.  Procedural Findings:  Hemodynamics (mmHg)  RA mean 14  RV 79/20  PA 84/35, mean 56  PCWP mean 29, prominent v-waves to 40  LV 138/28  AO 133/85  Oxygen saturations:  PA 54%%  AO 92%  Cardiac Output (Fick) 2.92  Cardiac Index (Fick) 6.55  PVR 4.1 WU  Coronary angiography:  Coronary dominance: right  Left mainstem: No significant disease.  Left anterior descending (LAD): No angiographic CAD.  Left circumflex (  LCx): Large ramus. No angiographic CAD in LCx system.  Right coronary artery (RCA): No angiographic CAD.  Left ventriculography: Not done with elevated PCWP/LVEDP.  Final Conclusions: Elevated left and right heart filling pressures with preserved cardiac output. Primarily pulmonary venous hypertension but also suspect with PVR 4 that there is a component of perhaps reactive pulmonary vasoconstriction in the setting of severe MR. Prominent V-waves in PCWP tracing. No angiographic coronary disease.  Recommendations: Needs HD for volume removal prior to MV surgery. Continue to push afterload reduction in setting of severe MR as long as he tolerates this at HD, will increase hydralazine to 50 mg tid.  Loralie Champagne    03/28/2014, 8:48 AM    Assessment/Plan: S/P Procedure(s) (LRB): LEFT AND RIGHT HEART CATHETERIZATION WITH CORONARY ANGIOGRAM (N/A)  Overall somewhat improved from standpoint of CHF and no fevers over last 72 hours.  Recovering well from left popliteal embolectomy with above-knee to below-knee bypass.  No abdominal pain nor other symptoms related to splenic infarct or mycotic aneurysm of SMA.  Results of cath noted.  I agree with recommendations to continue to push HD for volume removal prior to proceeding with mitral valve repair/replacement.  Continue IV antibiotics.  Increase mobility as much as possible over the next few days.  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    Await Dental Service consultation  Will tentatively plan for mitral valve repair/replacement next week if he continues to improve.  Will follow closely.   I spent in excess of 30 minutes during the conduct of this hospital encounter and >50% of this time involved direct face-to-face encounter with the patient for counseling and/or coordination of their care.            Katha Kuehne H 03/28/2014 5:26 PM

## 2014-03-28 NOTE — Progress Notes (Signed)
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 Currently on bed rest. Mild SOB. Denies any CP  CURRENT MEDS . sodium chloride   Intravenous Once  . amLODipine  10 mg Oral Daily  . aspirin  81 mg Oral Pre-Cath  .  ceFAZolin (ANCEF) IV  2 g Intravenous Q T,Th,Sat-1800  . chlorhexidine  15 mL Mouth/Throat BID  . cinacalcet  60 mg Oral QPC supper  . darbepoetin (ARANESP) injection - DIALYSIS  40 mcg Intravenous Q Thu-HD  . feeding supplement (NEPRO CARB STEADY)  237 mL Oral BID BM  . heparin  5,000 Units Subcutaneous 3 times per day  . hydrALAZINE  50 mg Oral 3 times per day  . lisinopril  20 mg Oral Daily  . sevelamer carbonate  800 mg Oral TID WC  . sodium chloride  3 mL Intravenous Q12H  . sodium chloride  3 mL Intravenous Q12H  . sodium chloride  3 mL Intravenous Q12H    OBJECTIVE  Filed Vitals:   03/28/14 0934 03/28/14 0939 03/28/14 0944 03/28/14 0949  BP: 150/86 159/80 151/84 149/87  Pulse: 84 83 84 84  Temp:      TempSrc:      Resp: 28 28 24 27   Height:      Weight:      SpO2: 98% 99% 98% 99%    Intake/Output Summary (Last 24 hours) at 03/28/14 1005 Last data filed at 03/27/14 2340  Gross per 24 hour  Intake    260 ml  Output    200 ml  Net     60 ml   Filed Weights   03/26/14 1300 03/27/14 0458 03/28/14 0500  Weight: 230 lb 9.6 oz (104.6 kg) 230 lb  9.6 oz (104.6 kg) 230 lb 13.2 oz (104.7 kg)    PHYSICAL EXAM  General: Pleasant, NAD. Neuro: Alert and oriented X 3. Moves all extremities spontaneously. Psych: Normal affect. HEENT:  Normal  Neck: Supple without bruits or JVD. Lungs:  Resp regular and unlabored, anterior exam CTA (bed currently on bedrest post cath). Heart: RRR no s3, s4, 2/6 systolic murmur Abdomen: Soft, non-tender, non-distended, BS + x 4.  Extremities: No clubbing, cyanosis. DP/PT/Radials 2+ and equal bilaterally. 1+ LLE pitting edema, surgical scar sealed with steri-strip. No LE swelling on R side  Accessory Clinical Findings  CBC  Recent Labs  03/27/14 1457 03/28/14 0340  WBC 13.0* 12.6*  HGB 8.8* 8.8*  HCT 26.2* 26.1*  MCV 90.7 90.6  PLT 229 0000000   Basic Metabolic Panel  Recent Labs  03/26/14 0340 03/27/14 1457 03/28/14 0340  NA 138 138 135*  K 5.1 4.0 3.9  CL 97 97 95*  CO2 25 28 24   GLUCOSE 96 145* 106*  BUN 36* 27* 31*  CREATININE 9.64* 7.77* 9.12*  CALCIUM 10.2 10.8* 10.7*  PHOS 7.5*  --  6.8*   Liver Function Tests  Recent Labs  03/26/14 0340 03/28/14 0340  ALBUMIN 2.1* 1.9*    TELE NSR with HR 80s, no significant ventricular ectopy    ECG  NSR with HR 80s, prolonged QTc  Echocardiogram  03/23/2014 LV EF: 60% - 65%  ------------------------------------------------------------------- Indications: Dyspnea 786.09.  ------------------------------------------------------------------- History: Risk factors: End stage renal disease. Hypertension.  ------------------------------------------------------------------- Study Conclusions  - Left ventricle: The cavity size was normal. Wall thickness was increased in a pattern of mild LVH. Systolic function was normal. The estimated ejection fraction was in the range of 60% to 65%. Indeterminant diastolic function. Wall motion was normal; there were no regional wall motion abnormalities. - Ventricular septum: D-shaped  septum concerning for RV pressure/volume overload. - Aortic valve: There was no stenosis. - Mitral valve: There appears to be a small mobile vegetation on the posterior leaflet of the mitral valve. Mildly to moderately calcified annulus. There was moderate to severe regurgitation. - Left atrium: The atrium was mildly dilated. - Right ventricle: The cavity size was mildly dilated. Systolic function was normal. - Right atrium: The atrium was mildly dilated. - Tricuspid valve: Peak RV-RA gradient (S): 48 mm Hg. - Pulmonary arteries: PA peak pressure: 63 mm Hg (S). - Systemic veins: IVC measured 2.2 cm with < 50% respirophasic variation, suggesting RA pressure 15 mmHg.  Impressions:  - Normal LV size with mild LV hypertrophy. EF 60-65%. Mildly dilated RV with normal systolic function. D-shaped interventricular septum suggestive of RV pressure/volume overload. Moderate pulmonary hypertension. There was a small mobile vegetation on the posterior leaflet of the mitral valve. There was significant MR, ?moderate-severe. Cannot rule out perforation based on this finding. Patient needs a TEE.      Radiology/Studies  Dg Orthopantogram  03/25/2014   CLINICAL DATA:  Fever.  EXAM: ORTHOPANTOGRAM/PANORAMIC  COMPARISON:  None.  FINDINGS: Poor dentition is noted. No lytic destruction is seen to suggest osteomyelitis or bony abscess.  IMPRESSION: No definite evidence of osteomyelitis or bony abscess seen in the mandible.   Electronically Signed   By: Sabino Dick M.D.   On: 03/25/2014 09:06   Dg Chest 2 View  03/25/2014   CLINICAL DATA:  Fever  EXAM: CHEST  2 VIEW  COMPARISON:  03/23/2014  FINDINGS: Heart size is mildly enlarged with persistent central vascular congestion. Trace pleural effusions are slightly larger than previously. No focal lobar consolidation. No pneumothorax. No acute osseous finding.  IMPRESSION: Cardiomegaly with central vascular congestion and trace effusions.   Electronically  Signed   By: Conchita Paris M.D.   On: 03/25/2014 09:03   Dg Chest 2 View  03/23/2014   CLINICAL DATA:  Weakness, leg pain  EXAM: CHEST  2 VIEW  COMPARISON:  03/22/2014  FINDINGS: Borderline cardiomegaly. Small right pleural effusion with right basilar atelectasis. Central mild vascular congestion and mild perihilar interstitial prominence suspicious for mild interstitial edema. Bony thorax is unremarkable.  IMPRESSION: Small right pleural effusion with right basilar atelectasis. Central mild vascular congestion and mild perihilar interstitial prominence suspicious for mild interstitial edema. No segmental infiltrate.   Electronically Signed   By: Lahoma Crocker M.D.   On: 03/23/2014 09:04   Dg Chest 2 View  03/22/2014   CLINICAL DATA:  Shortness of breath  EXAM: CHEST  2 VIEW  COMPARISON:  07/23/2013  FINDINGS: There is no focal parenchymal opacity, pleural effusion, or pneumothorax. Mild pulmonary vascular congestion. There is mild cardiomegaly.  The osseous structures are unremarkable.  IMPRESSION: Mild  pulmonary vascular congestion. Otherwise no active cardiopulmonary disease.   Electronically Signed   By: Kathreen Devoid   On: 03/22/2014 12:14   Ct Angio Chest W/cm &/or Wo Cm  03/22/2014   CLINICAL DATA:  Short of breath.  Cough.  EXAM: CT ANGIOGRAPHY CHEST WITH CONTRAST  TECHNIQUE: Multidetector CT imaging of the chest was performed using the standard protocol during bolus administration of intravenous contrast. Multiplanar CT image reconstructions and MIPs were obtained to evaluate the vascular anatomy.  CONTRAST:  3mL OMNIPAQUE IOHEXOL 350 MG/ML SOLN  COMPARISON:  09/12/2012  FINDINGS: No evidence of a pulmonary embolus.  Heart is mildly enlarged. There are minor coronary artery calcifications. Great vessels are normal in caliber.  There is mediastinal adenopathy. Multiple reference measurements were made. There is a 14 mm short axis node in the right superior mediastinum. There is a 19 mm short axis  prevascular lymph node adjacent the left pulmonary artery. There is a 15 mm right subcarinal lymph node. There are several other prominent to mildly enlarged lymph nodes. Although there were prominent nodes previously, the larger nodes have increased in size. There are prominent hilar lymph nodes not well defined, and without change.  Small right pleural effusion.  No left effusion.  Stable calcified pleural plaque over the right hemidiaphragm.  Lungs show mild diffuse interstitial thickening and hazy and somewhat heterogeneous airspace opacities which have a central to upper lobe predominance. Interstitial thickening is greatest in the lung bases.  Limited evaluation of the upper abdomen shows gallstones but is otherwise unremarkable.  No osteoblastic or osteolytic lesions.  Review of the MIP images confirms the above findings.  IMPRESSION: 1. No evidence of a pulmonary embolism. 2. Mild cardiomegaly, interstitial thickening and hazy areas of airspace lung opacity, as well as a small right effusion, all suggests congestive heart failure. However, there also prominent and mildly enlarged mediastinal lymph nodes. These are likely reactive. The possibility of lung infection should be considered in the proper clinical setting.   Electronically Signed   By: Lajean Manes M.D.   On: 03/22/2014 13:09   Mr Brain Wo Contrast  03/25/2014   CLINICAL DATA:  Fever, chills, and dyspnea. Endocarditis. Evaluate for embolic stroke.  EXAM: MRI HEAD WITHOUT CONTRAST  TECHNIQUE: Multiplanar, multiecho pulse sequences of the brain and surrounding structures were obtained without intravenous contrast.  COMPARISON:  None.  FINDINGS: There is no evidence of acute infarct, intracranial hemorrhage, mass, midline shift, or extra-axial fluid collection. Ventricles and sulci are within normal limits for age. A few punctate foci of T2 hyperintensity in the white matter of the frontal lobes are nonspecific and likely within normal limits for  age.  Orbits are unremarkable. Paranasal sinuses and mastoid air cells are clear. Major intracranial vascular flow voids are preserved.  IMPRESSION: No acute infarct.  Unremarkable appearance of the brain for age.   Electronically Signed   By: Logan Bores   On: 03/25/2014 10:20   Ct Angio Ao+bifem W/cm &/or Wo/cm  03/25/2014   ADDENDUM REPORT: 03/25/2014 08:50  ADDENDUM: The following should been included in the impression:  Inflammatory change in the subcutaneous fat overlying the deep fascia of the left vastus lateralis muscle.  These result of the entire exam were called by telephone at the time of interpretation on 03/25/2014 at 8:47 am to Dr. Loralie Champagne , who verbally acknowledged these results.   Electronically Signed   By: Jacqulynn Cadet M.D.   On: 03/25/2014 08:50   03/25/2014  CLINICAL DATA:  48 year old male with bacterial endocarditis and acute onset left knee pain. Evaluate for aortoiliac embolic disease or evidence of septic arthritis / septic embolization.  EXAM: CT ANGIOGRAPHY OF ABDOMINAL AORTA WITH ILIOFEMORAL RUNOFF  TECHNIQUE: Multidetector CT imaging of the abdomen, pelvis and lower extremities was performed using the standard protocol during bolus administration of intravenous contrast. Multiplanar CT image reconstructions and MIPs were obtained to evaluate the vascular anatomy.  CONTRAST:  148mL OMNIPAQUE IOHEXOL 350 MG/ML SOLN  COMPARISON:  CT chest 03/22/2014 ; radiographs of the left knee 03/23/2014  FINDINGS: VASCULAR  Aorta: Normal caliber aorta. No significant atherosclerotic plaque or irregular wall thickening. No inflammation common dissection or aneurysmal dilatation.  Celiac: Widely patent. Conventional hepatic arterial anatomy. No evidence of mycotic or other visceral artery aneurysm.  SMA: Widely patent. There is a focal fusiform aneurysm with mild hazy arterial wall thickening arising from a distal ileal branch of the SMA beyond the ileocecal branches to the right of  midline in the mid to lower abdomen. Aneurysm measures 1.4 x 1.0 cm in diameter which is significantly larger than the 4.4 mm parent vessel.  Renals: Abhorrent right renal artery anatomy. There is a small retrocaval accessory renal artery to the upper pole. The dominant renal artery originates low on the aorta beyond the origin of the IMA. On the left, there is a single dominant renal artery and typical anatomic position. Mild focal heterogeneous atherosclerotic plaque versus focal changes of fibromuscular dysplasia results in at least moderate stenosis of the proximal left renal artery. Evaluation is slightly limited by focal motion artifact. The 2 right-sided renal arteries appear patent.  IMA: Widely patent and unremarkable.  RIGHT Lower Extremity  Inflow: Widely patent and unremarkable comment, internal and external iliac arteries. Trace plaque along the distal common iliac artery without stenosis.  Outflow: The common, profunda wall and superficial femoral arteries are relatively disease free and widely patent. No evidence of pathology. Calcified atherosclerotic plaque is noted in the above the knee popliteal artery resulting in moderate focal stenosis. Remainder the popliteal artery is widely patent.  Runoff: Mild multifocal narrowing of the posterior tibial artery. Three vessel runoff patent to the ankle.  LEFT lower Extremity  Inflow: Widely patent common, internal and external iliac arteries.  Outflow: The common, profunda and superficial femoral arteries are widely patent. There is trace atherosclerotic plaque in the superficial femoral artery distribution without significant stenosis. The entire 5.5 cm length of the left popliteal artery is abnormal. The vessel is nearly occluded proximally with a linear defects which can best be described as a focal dissection. There is irregular inflammatory stranding surrounding the vessel involving the lateral head of the gastrocnemius muscle in the intracompartmental  fat within the upper calf. The arterial wall is thickened and poorly defined. The mid segment of the vessel at the level of the knee is diffusely irregular and heterogeneous with intermittent contrast opacification in the lumen. There is a short segment complete occlusion secondary to intraluminal filling defect which then extends distally as a semi occlusive thrombus to the level of the tibial plateau. Below the knee, the popliteal artery regains patency just proximal to the trifurcation.  Runoff: Slightly aberrant trifurcation anatomy. The posterior tibial artery arises proximally hand and there is a anterior tibial-peroneal trunk. The runoff vessels remain patent to the ankle.  Veins: No focal venous abnormality.  Review of the MIP images confirms the above findings.  NON-VASCULAR  Lower Chest: Incidentally noted gynecomastia. Small right pleural effusion.  Calcified right pleural plaque. Combination dependent atelectasis and patchy consolidation in the lower lobes. Trace atherosclerotic calcifications in the coronary arteries. Borderline cardiomegaly. No pericardial effusion. Unremarkable thoracic esophagus.  Abdomen: Unremarkable appearance of the stomach, duodenum, and adrenal glands. There is an intermediate attenuation 4.1 x 4.1 by 2.6 cm lesion in the superior pole of the spleen concerning for possible septic abscess. Normal hepatic contour and configuration. No discrete hepatic lesion. Multiple small stones within the gallbladder lumen and neck. No secondary signs to suggest acute cholecystitis. The portal veins remain patent.  The bilateral kidneys are nearly entirely replaced with cysts. The kidneys are also slightly enlarged. No definite enhancing renal lesion. Evaluation is limited by mild motion artifact. Colonic diverticular disease without CT evidence of active inflammation. No evidence of obstruction or focal bowel wall thickening. Normal appendix in the right lower quadrant. The terminal ileum is  unremarkable. No free fluid or suspicious adenopathy. There is a 1.1 cm peripherally calcified structure within the small bowel mesenteric left of midline which measures 1.1 cm in diameter (image 105 series 5). This structure abuts the mesenteric border of the adjacent small bowel. No surrounding desmoplastic change or inflammation.  Pelvis: Abnormal left external iliac station lymph nodes. Although not enlarged by CT criteria (7 and 5 mm in short axis) there is interstitial inflammatory stranding in the perinodal fat. These are concerning for reactive adenopathy versus adenitis given the patient's known clinical history. No necrosis or abscess. Under distended but diffusely thick-walled bladder. Unremarkable prostate and seminal vesicles. Calcification of the bilateral vas deferens. No free fluid.  Bones/Soft Tissues: No acute fracture or aggressive appearing lytic or blastic osseous lesion. Transitional anatomy with sacralization of L5. Focal degenerative disc disease at L4-L5 with vacuum disc phenomenon. Localized inflammatory change of the deeper muscular fascia overlying the vastus lateralis muscle in the left thigh.  IMPRESSION: VASCULAR  1. Diffusely abnormal left popliteal artery. The findings most likely represent a combination of septic embolus, mycotic arteritis and dissection. The entire 5.5 cm length of the left popliteal artery is abnormal with dissection proximally in the above the knee popliteal artery, focal complete complete occlusion at the level of the joint and partially occluding septic embolus extending to the tibial plateau. There is surrounding inflammatory change involving the deep intermuscular fat of the upper calf and the lateral head of the gastrocnemius muscle. 2. 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 The aneurysm measures 1.4 x 1.1 cm which is significantly larger than the 4.5 mm parent vessel. No evidence of downstream occlusion or small  bowel ischemia. 3. Aberrant left trifurcation anatomy. The posterior tibial artery arises proximally bleeding in anterior tibial - peroneal trunk which then bifurcates. Three vessel runoff remains patent to the ankle. 4. Focal moderate stenosis of the proximal left renal artery. Secondary to motion artifact is unclear if this represents focal fibro fatty atherosclerotic plaque or focal changes of FMD. 5. Aberrant right renal artery anatomy as described above without stenosis or FMD. NON VASCULAR  1. Probable 4.1 cm septic abscess in the upper pole of the spleen. Additional differential considerations include splenic hemangioma, hammertoe mal or complex cyst although these are all considered significantly less likely given the patient's overall clinical picture. 2. Left external iliac station adenitis, likely reactive and related to the patient's multifocal septic emboli. 3. Bilateral renal enlargement with innumerable cysts of varying complexity. Differential considerations include dialysis associated acquired cystic kidney disease and autosomal dominant polycystic kidney disease.  The former is favored. 4. 1.1 cm peripherally calcified structure within the small bowel mesenteric fat adjacent to a loop of distal jejunum/ileum left of midline without evidence of surrounding desmoplastic change. This finding is nonspecific and may represent sequelae of old granulomatous disease, a peripherally calcified duplication cyst, or the residua of prior fat necrosis. Carcinoid or desmoid tumor is considered significantly less likely given the complete absence of surrounding desmoplastic change. Recommend followup CT scan of the abdomen and pelvis in 1 year to confirm stability. 5. Under distended but diffusely thick-walled bladder. Recommend clinical correlation for signs and symptoms of cystitis. 6. Cholelithiasis without secondary signs to suggest acute cholecystitis. 7. Scattered colonic diverticula without evidence of  active diverticulitis. 8. Transitional L5 anatomy with associated focal L4-L5 degenerative disc disease.  Signed,  Criselda Peaches, MD  [facility]  [facility]  Electronically Signed: By: Jacqulynn Cadet M.D. On: 03/25/2014 08:35   Mr Knee Left  Wo Contrast  03/25/2014   CLINICAL DATA:  Popping injury left knee during yardwork. Numbness in the knee. Pain.  EXAM: MRI OF THE LEFT KNEE WITHOUT CONTRAST  TECHNIQUE: Multiplanar, multisequence MR imaging of the knee was performed. No intravenous contrast was administered.  COMPARISON:  None.  FINDINGS: Despite efforts by the technologist and patient, motion artifact is present on today's exam and could not be eliminated. This reduces exam sensitivity and specificity.  MENISCI  Medial meniscus:  Intact  Lateral meniscus:  Intact  LIGAMENTS  Cruciates:  Intact  Collaterals:  Intact  CARTILAGE  Patellofemoral:  Intact  Medial:  Intact  Lateral:  Intact  Joint:  Small knee effusion.  Popliteal Fossa: Abnormal infiltrative edema in the popliteal fossa abnormal edema in the distal biceps femorals muscle. Plantaris tendon seems intact. 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 The medial head gastrocnemius is also edematous. Small Baker's cyst. Proximal soleus muscle intact.  Extensor Mechanism: Proximal patellar partial tear potentially with a small partial avulsion from the inferior pole of the patella.  Bones:  Intact except as noted above.  IMPRESSION: 1. Muscle injury posteriorly with abnormal edema in the biceps femoris, medial head gastrocnemius, and lateral head gastrocnemius. 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 2. Proximal patellar tendon partial tear with a small partially avulsion from the inferior pole of the patella. 3. Small knee effusion.  Small Baker's  cyst.   Electronically Signed   By: Sherryl Barters M.D.   On: 03/25/2014 10:45   Dg Ang/ext/uni/or Left  03/25/2014   CLINICAL DATA:  48 year old male with end-stage renal disease. Left leg ischemia.  EXAM: Intraoperative angiogram  COMPARISON:  None.  FINDINGS: Single arteriogram image of intraoperative left lower extremity angiogram submitted.  Tip of infusion catheter via surgical cutdown just above the popliteal artery, potentially the distal aspect of a graft.  Extravasation of contrast from the tibioperoneal trunk, or potentially the posterior tibial artery with no contrast flowing within this artery distally.  The anterior tibial artery is patent to the inferior margin of the exam.  IMPRESSION: Single intraoperative angiographic image of the left lower extremity demonstrates extravasation of contrast from what may be tibioperoneal trunk or posterior tibial artery.  The anterior tibial artery is patent to the inferior margin of the exam.  Signed,  Dulcy Fanny. Earleen Newport, DO  [facility]  [facility]   Electronically  Signed   By: Corrie Mckusick O.D.   On: 03/25/2014 17:59   Dg Knee Ap/lat W/sunrise Left  03/23/2014   CLINICAL DATA:  Left knee pain and swelling for 3 days  EXAM: DG KNEE - 3 VIEWS  COMPARISON:  None.  FINDINGS: There is no evidence of fracture, dislocation, or joint effusion. There is no evidence of arthropathy or other focal bone abnormality. Soft tissues are unremarkable. Transverse patellar cleft incidentally noted. Vascular calcifications are noted.  IMPRESSION: Negative.   Electronically Signed   By: Conchita Paris M.D.   On: 03/23/2014 21:17    ASSESSMENT AND PLAN  1. 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 mitral valve vegetation on the posterior mitral valve leaflet with severe mitral regurgitation by TEE   - ID on board, continue Abx  3. end-stage renal disease, on HD T/Th/Sat   4. Acute diastolic heart  failure in the setting of severe mitral regurgitation, improving   - Echo 03/23/2014 EF 60-65%, mild LVH, D-shaped septum concerning for RV overload, small mobil vegetation on posterior leaflet of mitral valve, moderate to severe MR with mild to moderately calcified annulus, PA peak pressure 63.  - TEE 03/24/2014 severe mitral regurg  - L&RHC 03/28/2014 CO 2.92, CI 6.55, PCWP 29, RV 79/20, clean coronary  - markedly elevated RV pressure and sign of L filling pressure in the setting of severe MR, need aggressive HD for fluid removal prior to MV surgery  - CT surgery on board, considering MVR  5. Severe L foot ischemia 2/2 L popliteal occlusion 2/2 septic emboli  - s/p L popliteal embolectomy and intraopertaive arteriogram 9/18  - s/p above knee to below knee popliteal bypass with reversed great saphenous vein    Signed, Woodward Ku Pager: [phone] Patient seen and examined and history reviewed. Agree with above findings and plan. Results of cardiac cath noted. No CAD. Severe MR with severe pulmonary HTN. Blood cultures now negative. Awaiting dental consultation. Dr. Roxy Manns to determine timing of MV repair/replacement.  Pailyn Bellevue [place], [place] 03/28/2014 12:02 PM

## 2014-03-28 NOTE — Progress Notes (Signed)
   [name] Progress Note   Subjective: Had heart cath this am, LVEDP high, no sig CAD.  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- for Ancef thru 10/20, heart cath done today showing ^LVEDP, no CAD 2 Septic embolus LLE, s/p fem-pop BPG on 9/18 3 ESRD on HD 4 HTN on 3 BP meds, below dry wt 5 Anemia cont aranesp at 40/wk 6 HPTH cont sensipar, renvela, vit D on hold d/t ^Ca  Plan- extra HD today for volume, lower dry wt, lower meds    Kelly Splinter MD  pager [phone]    cell [phone]  03/28/2014, 9:15 AM     Recent Labs Lab 03/26/14 0050 03/26/14 0340 03/27/14 1457 03/28/14 0340  NA 138 138 138 135*  K 4.8 5.1 4.0 3.9  CL 96 97 97 95*  CO2 26 25 28 24   GLUCOSE 100* 96 145* 106*  BUN 34* 36* 27* 31*  CREATININE 9.52* 9.64* 7.77* 9.12*  CALCIUM 10.3 10.2 10.8* 10.7*  PHOS 7.4* 7.5*  --  6.8*    Recent Labs Lab 03/26/14 0050 03/26/14 0340 03/28/14 0340  ALBUMIN 2.3* 2.1* 1.9*    Recent Labs Lab 03/26/14 2347 03/27/14 1457 03/28/14 0340  WBC 12.8* 13.0* 12.6*  HGB 9.0* 8.8* 8.8*  HCT 27.2* 26.2* 26.1*  MCV 90.7 90.7 90.6  PLT 230 229 243   . sodium chloride   Intravenous Once  . amLODipine  10 mg Oral Daily  . aspirin  81 mg Oral Pre-Cath  .  ceFAZolin (ANCEF) IV  2 g Intravenous Q T,Th,Sat-1800  . chlorhexidine  15 mL Mouth/Throat BID  . cinacalcet  60 mg Oral QPC supper  . darbepoetin  (ARANESP) injection - DIALYSIS  40 mcg Intravenous Q Thu-HD  . feeding supplement (NEPRO CARB STEADY)  237 mL Oral BID BM  . heparin  5,000 Units Subcutaneous 3 times per day  . hydrALAZINE  50 mg Oral 3 times per day  . lisinopril  20 mg Oral Daily  . sevelamer carbonate  800 mg Oral TID WC  . sodium chloride  3 mL Intravenous Q12H  . sodium chloride  3 mL Intravenous Q12H  . sodium chloride  3 mL Intravenous Q12H   . sodium chloride 10 mL/hr (03/25/14 1338)   sodium chloride, sodium chloride, sodium chloride, sodium chloride, feeding supplement (NEPRO CARB STEADY), heparin, lidocaine (PF), lidocaine-prilocaine, oxyCODONE-acetaminophen, pentafluoroprop-tetrafluoroeth, sodium chloride, sodium chloride

## 2014-03-28 NOTE — CV Procedure (Signed)
    Cardiac Catheterization Procedure Note  Name: Timothy Palmer MRN: [id number] DOB: [birth year]  Procedure: Right Heart Cath, Left Heart Cath, Selective Coronary Angiography  Indication: Pre-MV surgery, endocarditis.    Procedural Details: The right groin was prepped, draped, and anesthetized with 1% lidocaine. Using the modified Seldinger technique a 5 French sheath was placed in the right femoral artery and a 7 French sheath was placed in the right femoral vein. A Swan-Ganz catheter was used for the right heart catheterization. Standard protocol was followed for recording of right heart pressures and sampling of oxygen saturations. Fick cardiac output was calculated. Standard Judkins catheters were used for selective coronary angiography. There were no immediate procedural complications. The patient was transferred to the post catheterization recovery area for further monitoring.  Procedural Findings: Hemodynamics (mmHg) RA mean 14 RV 79/20 PA 84/35, mean 56 PCWP mean 29, prominent v-waves to 40 LV 138/28 AO 133/85  Oxygen saturations: PA 54%% AO 92%  Cardiac Output (Fick) 2.92  Cardiac Index (Fick) 6.55  PVR 4.1 WU  Coronary angiography: Coronary dominance: right  Left mainstem: No significant disease.   Left anterior descending (LAD): No angiographic CAD.   Left circumflex (LCx): Large ramus.  No angiographic CAD in LCx system.   Right coronary artery (RCA): No angiographic CAD.   Left ventriculography: Not done with elevated PCWP/LVEDP.   Final Conclusions:  Elevated left and right heart filling pressures with preserved cardiac output.  Primarily pulmonary venous hypertension but also suspect with PVR 4 that there is a component of perhaps reactive pulmonary vasoconstriction in the setting of severe MR.  Prominent V-waves in PCWP tracing.  No angiographic coronary disease.   Recommendations: Needs HD for volume removal prior to MV surgery.  Continue to push  afterload reduction in setting of severe MR as long as he tolerates this at HD, will increase hydralazine to 50 mg tid.    Loralie Champagne 03/28/2014, 8:48 AM

## 2014-03-28 NOTE — Progress Notes (Addendum)
Subjective: No overnight events. Pt has remained afebrile for >48hrs. Right and left heart cath today with clean coronaries, severe MR with severe pulmonary HTN. Some pain at right femoral cath site and some pain in left leg at incision sites.  Objective: Vital signs in last 24 hours: Filed Vitals:   03/28/14 1046 03/28/14 1115 03/28/14 1205 03/28/14 1255  BP: 153/82 151/79 130/73 146/85  Pulse: 88 92 86 88  Temp:  97.7 F (36.5 C)    TempSrc:  Oral    Resp: 28 34 22 23  Height:      Weight:      SpO2: 98% 99% 98% 100%   Weight change: 3.5 oz (0.1 kg)  Intake/Output Summary (Last 24 hours) at 03/28/14 1316 Last data filed at 03/28/14 1300  Gross per 24 hour  Intake    460 ml  Output    200 ml  Net    260 ml   Vitals reviewed. General: Lying up in bed  HEENT: EOMI Cardiac: RRR, 3/6 holosystolic murmur at the apex Pulm: Clear to auscultation bilaterally, no wheezes, rales, or rhonchi Abd: Soft, nontender, nondistended, BS present Ext: Warm and well perfused. 2+ DP pulse in right foot, 1+ DP pulse in left foot.  Neuro: Alert and oriented X3, moves all 4 extremities, nonfocal   Lab Results: Basic Metabolic Panel:  Recent Labs Lab 03/22/14 1142 03/22/14 1349  03/26/14 0340 03/27/14 1457 03/28/14 0340  NA 140  --   < > 138 138 135*  K 3.6*  --   < > 5.1 4.0 3.9  CL 94*  --   < > 97 97 95*  CO2 33*  --   < > '25 28 24  ' GLUCOSE 105*  --   < > 96 145* 106*  BUN 12  --   < > 36* 27* 31*  CREATININE 5.85*  --   < > 9.64* 7.77* 9.12*  CALCIUM 9.8  --   < > 10.2 10.8* 10.7*  MG  --  1.8  --   --   --   --   PHOS  --   --   < > 7.5*  --  6.8*  < > = values in this interval not displayed. CBC:  Recent Labs Lab 03/27/14 1457 03/28/14 0340  WBC 13.0* 12.6*  HGB 8.8* 8.8*  HCT 26.2* 26.1*  MCV 90.7 90.6  PLT 229 243   Cardiac Enzymes:  Recent Labs Lab 03/22/14 1922 03/23/14 0014 03/23/14 0612  TROPONINI <0.30 <0.30 <0.30   BNP:  Recent Labs Lab  03/22/14 1142  PROBNP 30804.0*   Thyroid Function Tests:  Recent Labs Lab 03/22/14 1922  TSH 0.901    Urinalysis: No results found for this basename: COLORURINE, APPERANCEUR, LABSPEC, PHURINE, GLUCOSEU, HGBUR, BILIRUBINUR, KETONESUR, PROTEINUR, UROBILINOGEN, NITRITE, LEUKOCYTESUR,  in the last 168 hours  Misc. Labs:   Micro Results: Recent Results (from the past 240 hour(s))  CULTURE, BLOOD (ROUTINE X 2)     Status: None   Collection Time    03/22/14  1:52 PM      Result Value Ref Range Status   Specimen Description BLOOD ARM RIGHT   Final   Special Requests BOTTLES DRAWN AEROBIC ONLY 10CC   Final   Culture  Setup Time     Final   Value: 03/22/2014 18:55     Performed at [facility]   Culture     Final   Value: NO GROWTH 5 DAYS  Performed at [facility]   Report Status 03/28/2014 FINAL   Final  CULTURE, BLOOD (ROUTINE X 2)     Status: None   Collection Time    03/22/14  2:00 PM      Result Value Ref Range Status   Specimen Description BLOOD HAND RIGHT   Final   Special Requests BOTTLES DRAWN AEROBIC AND ANAEROBIC 10CC   Final   Culture  Setup Time     Final   Value: 03/22/2014 18:56     Performed at [facility]   Culture     Final   Value: NO GROWTH 5 DAYS     Performed at [facility]   Report Status 03/28/2014 FINAL   Final  RESPIRATORY VIRUS PANEL     Status: None   Collection Time    03/22/14  9:22 PM      Result Value Ref Range Status   Source - RVPAN NASOPHARYNGEAL   Final   Respiratory Syncytial Virus A NOT DETECTED   Final   Respiratory Syncytial Virus B NOT DETECTED   Final   Influenza A NOT DETECTED   Final   Influenza B NOT DETECTED   Final   Parainfluenza 1 NOT DETECTED   Final   Parainfluenza 2 NOT DETECTED   Final   Parainfluenza 3 NOT DETECTED   Final   Metapneumovirus NOT DETECTED   Final   Rhinovirus NOT DETECTED   Final   Adenovirus NOT DETECTED   Final   Influenza A H1 NOT DETECTED   Final    Influenza A H3 NOT DETECTED   Final   Comment: (NOTE)           Normal Reference Range for each Analyte: NOT DETECTED     Testing performed using the Luminex xTAG Respiratory Viral Panel test     kit.     The analytical performance characteristics of this assay have been     determined by [facility].  The modifications have not been     cleared or approved by the FDA. This assay has been validated pursuant     to the CLIA regulations and is used for clinical purposes.     Performed at [place], BLOOD (ROUTINE X 2)     Status: None   Collection Time    03/23/14  7:20 PM      Result Value Ref Range Status   Specimen Description BLOOD RIGHT HAND   Final   Special Requests BOTTLES DRAWN AEROBIC AND ANAEROBIC 5CC   Final   Culture  Setup Time     Final   Value: 03/23/2014 22:25     Performed at [facility]   Culture     Final   Value:        BLOOD CULTURE RECEIVED NO GROWTH TO DATE CULTURE WILL BE HELD FOR 5 DAYS BEFORE ISSUING A FINAL NEGATIVE REPORT     Performed at [facility]   Report Status PENDING   Incomplete  ANAEROBIC CULTURE     Status: None   Collection Time    03/25/14  3:21 PM      Result Value Ref Range Status   Specimen Description WOUND   Final   Special Requests LEFT POPITEAL EMBOLIUS PT ON ZINACEF   Final   Gram Stain PENDING   Incomplete   Culture     Final   Value: NO ANAEROBES ISOLATED; CULTURE IN PROGRESS  FOR 5 DAYS     Performed at [facility]   Report Status PENDING   Incomplete  WOUND CULTURE     Status: None   Collection Time    03/25/14  3:21 PM      Result Value Ref Range Status   Specimen Description WOUND   Final   Special Requests LEFT POPITEAL EMBOLIUS PT ON ZINCEF   Final   Gram Stain PENDING   Incomplete   Culture     Final   Value: NO GROWTH 2 DAYS     Performed at [facility]   Report Status PENDING   Incomplete  MRSA PCR SCREENING     Status: None   Collection Time     03/26/14  6:31 AM      Result Value Ref Range Status   MRSA by PCR NEGATIVE  NEGATIVE Final   Comment:            The GeneXpert MRSA Assay (FDA     approved for NASAL specimens     only), is one component of a     comprehensive MRSA colonization     surveillance program. It is not     intended to diagnose MRSA     infection nor to guide or     monitor treatment for     MRSA infections.   Studies/Results: No results found. Medications: I have reviewed the patient's current medications. Scheduled Meds: . sodium chloride   Intravenous Once  . [START ON 03/29/2014] amLODipine  5 mg Oral QPM  .  ceFAZolin (ANCEF) IV  2 g Intravenous Q T,Th,Sat-1800  . chlorhexidine  15 mL Mouth/Throat BID  . cinacalcet  60 mg Oral QPC supper  . darbepoetin (ARANESP) injection - DIALYSIS  40 mcg Intravenous Q Thu-HD  . heparin  5,000 Units Subcutaneous 3 times per day  . hydrALAZINE  50 mg Oral 3 times per day  . sevelamer carbonate  800 mg Oral TID WC  . sodium chloride  3 mL Intravenous Q12H  . sodium chloride  3 mL Intravenous Q12H   Continuous Infusions: . sodium chloride 10 mL/hr (03/25/14 1338)   PRN Meds:.sodium chloride, acetaminophen, fentaNYL, lidocaine (PF), ondansetron (ZOFRAN) IV, sodium chloride  Assessment/Plan: 48yo M w/ PMH HTN and ESRD on HD presents to the ED with increasing SOB in the setting of a productive cough and fever and was found to have endocarditis.    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 TEE with similar findings and severe mitral regurgitation. Per Nephrology, +MSSA blood culture 5/15 and 02/2014, although negative 01/2014; treated with cefazolin 2g with HD starting 8/20 to continue until 10/20. Initially started on Vanc/Zosyn which was transitioned to Ancef given recent cultures. 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 Also with 4cm probable septic abscess in upper pole of the spleen. Pt is post-op day #3 s/p left fem-pop bypass and since the surgery is doing very well. Left and right heart cath today with no CAD, severe MR and pulmonary HTN. Unsure when CT Surgery is planning on vale replacement. Continuing Ancef for 6 weeks from mitral valve replacement, per ID.  - ID consult, appreciate recommendations - Cardiology consult, appreciate recommendations  - CT Surgery consult, appreciate recommendations - VVS consult,  appreciate recommendations - Continue Ancef per ID recommendations    - Blood cultures, NGTD  Acute heart failure: CXR and CTA concerning for CHF. TTE and TTE with mitral valve vegetation and severe regurgitation, also concern for perforation. Normal EF. Right and left cardiac cath today as noted above. CT Surgery to perform MVR. Pt undergoing HD TThSa with fluid removal, with extra session today to remove excess fluid.   End stage renal disease on HD: HD TThSa. Last dialysis 9/19, 1L removed. Pt below dry weight, renal lowering dry weight. Extra session HD today to remove excess fluid.  - F/u with Nephrology, appreciate recommendations.   Hypertension: Pt on ACEi at home which was continued on admission. Amlodipine started this admission. Oral Hydralazine started by Cardiology. BP elevated today.  - Increasing Amlodipine to 40m daily - Continue Lisinopril 264mdaily - Hydralazine increased to 5019m8h  T wave inversion in EKG: Improved. New diffuse T-wave inversions on EKG on admission. Troponin negative x3. Cardiology curbsided in the ED and felt changes due to electolyte abnormalities. K 3.6 and Mg 1.8. Replaced K.  Recent Labs Lab 03/24/14 1300 03/26/14 0050 03/26/14 0340 03/27/14 1457 03/28/14 0340  K 4.7 4.8 5.1 4.0 3.9    QT prolongation: Resolved. QTc >600 when previously normal. Improved on  repeat. Avoiding QTc prolonging drugs.     Dispo: Disposition is deferred at this time, awaiting improvement of current medical problems.     The patient does have a current PCP (KelLouis MeckelD) and [id number] after [facility].  The patient does not have transportation limitations that hinder transportation to clinic appointments.  .Services Needed at time of discharge: Y = Yes, Blank = No PT:   OT:   RN:   Equipment:   Other:     LOS: 6 days   KatOtho BellowsD 03/28/2014, 1:16 PM

## 2014-03-29 ENCOUNTER — Inpatient Hospital Stay (HOSPITAL_COMMUNITY): Payer: Medicare Other

## 2014-03-29 ENCOUNTER — Encounter (HOSPITAL_COMMUNITY): Payer: Medicare Other

## 2014-03-29 ENCOUNTER — Encounter (HOSPITAL_COMMUNITY): Payer: Self-pay | Admitting: Dentistry

## 2014-03-29 ENCOUNTER — Other Ambulatory Visit: Payer: Self-pay

## 2014-03-29 DIAGNOSIS — D649 Anemia, unspecified: Secondary | ICD-10-CM

## 2014-03-29 DIAGNOSIS — I330 Acute and subacute infective endocarditis: Secondary | ICD-10-CM

## 2014-03-29 DIAGNOSIS — I059 Rheumatic mitral valve disease, unspecified: Secondary | ICD-10-CM

## 2014-03-29 LAB — RENAL FUNCTION PANEL
ALBUMIN: 2.1 g/dL — AB (ref 3.5–5.2)
Anion gap: 16 — ABNORMAL HIGH (ref 5–15)
BUN: 22 mg/dL (ref 6–23)
CHLORIDE: 96 meq/L (ref 96–112)
CO2: 26 mEq/L (ref 19–32)
CREATININE: 7.27 mg/dL — AB (ref 0.50–1.35)
Calcium: 10.3 mg/dL (ref 8.4–10.5)
GFR calc Af Amer: 9 mL/min — ABNORMAL LOW (ref >90)
GFR calc non Af Amer: 8 mL/min — ABNORMAL LOW (ref >90)
GLUCOSE: 94 mg/dL (ref 70–99)
POTASSIUM: 4.2 meq/L (ref 3.7–5.3)
Phosphorus: 4.7 mg/dL — ABNORMAL HIGH (ref 2.3–4.6)
Sodium: 138 mEq/L (ref 137–147)

## 2014-03-29 LAB — CULTURE, BLOOD (ROUTINE X 2): Culture: NO GROWTH

## 2014-03-29 LAB — CBC
HEMATOCRIT: 27.5 % — AB (ref 39.0–52.0)
HEMOGLOBIN: 9.2 g/dL — AB (ref 13.0–17.0)
MCH: 30.4 pg (ref 26.0–34.0)
MCHC: 33.5 g/dL (ref 30.0–36.0)
MCV: 90.8 fL (ref 78.0–100.0)
Platelets: 255 10*3/uL (ref 150–400)
RBC: 3.03 MIL/uL — AB (ref 4.22–5.81)
RDW: 15.2 % (ref 11.5–15.5)
WBC: 10.2 10*3/uL (ref 4.0–10.5)

## 2014-03-29 MED ORDER — HYDRALAZINE HCL 50 MG PO TABS
75.0000 mg | ORAL_TABLET | Freq: Three times a day (TID) | ORAL | Status: DC
  Administered 2014-03-30 (×2): 75 mg via ORAL
  Filled 2014-03-29 (×6): qty 1

## 2014-03-29 MED ORDER — ACETAMINOPHEN 325 MG PO TABS: 650.0000 mg | ORAL_TABLET | Freq: Four times a day (QID) | ORAL | Status: DC | PRN

## 2014-03-29 MED ORDER — OXYCODONE-ACETAMINOPHEN 5-325 MG PO TABS
1.0000 | ORAL_TABLET | Freq: Four times a day (QID) | ORAL | Status: DC | PRN
  Administered 2014-03-29: 2 via ORAL
  Administered 2014-03-31 – 2014-04-02 (×2): 1 via ORAL
  Filled 2014-03-29 (×3): qty 2

## 2014-03-29 NOTE — Progress Notes (Signed)
Patient ID: Timothy Palmer, male   DOB: [birth year], 48 y.o.   MRN: [id number]    SUBJECTIVE:   Yesterday he was evaluated by Dr Orene Desanctis. No emergent dental needs identified.   HD today.    Scheduled Meds: . sodium chloride   Intravenous Once  . amLODipine  10 mg Oral QPM  .  ceFAZolin (ANCEF) IV  2 g Intravenous Q T,Th,Sat-1800  . chlorhexidine  15 mL Mouth/Throat BID  . cinacalcet  60 mg Oral QPC supper  . darbepoetin (ARANESP) injection - DIALYSIS  40 mcg Intravenous Q Thu-HD  . heparin  5,000 Units Subcutaneous 3 times per day  . hydrALAZINE  75 mg Oral 3 times per day  . sevelamer carbonate  800 mg Oral TID WC  . sodium chloride  3 mL Intravenous Q12H  . sodium chloride  3 mL Intravenous Q12H   Continuous Infusions: . sodium chloride 10 mL/hr (03/25/14 1338)   PRN Meds:.sodium chloride, acetaminophen, ondansetron (ZOFRAN) IV, sodium chloride    Filed Vitals:   03/28/14 2315 03/29/14 0455 03/29/14 0500 03/29/14 0700  BP: 142/71  141/87   Pulse: 90  84   Temp: 98.5 F (36.9 C)  98.1 F (36.7 C) 98 F (36.7 C)  TempSrc: Oral  Oral Oral  Resp: 29     Height:      Weight:  226 lb 6.6 oz (102.7 kg)    SpO2: 95%  99%     Intake/Output Summary (Last 24 hours) at 03/29/14 0855 Last data filed at 03/28/14 2315  Gross per 24 hour  Intake    320 ml  Output    225 ml  Net     95 ml    LABS: Basic Metabolic Panel:  Recent Labs  03/28/14 0340 03/29/14 0226  NA 135* 138  K 3.9 4.2  CL 95* 96  CO2 24 26  GLUCOSE 106* 94  BUN 31* 22  CREATININE 9.12* 7.27*  CALCIUM 10.7* 10.3  PHOS 6.8* 4.7*   Liver Function Tests:  Recent Labs  03/28/14 0340 03/29/14 0226  ALBUMIN 1.9* 2.1*   No results found for this basename: LIPASE, AMYLASE,  in the last 72 hours CBC:  Recent Labs  03/28/14 0340 03/29/14 0226  WBC 12.6* 10.2  HGB 8.8* 9.2*  HCT 26.1* 27.5*  MCV 90.6 90.8  PLT 243 255   Cardiac Enzymes: No results found for this basename: CKTOTAL,  CKMB, CKMBINDEX, TROPONINI,  in the last 72 hours BNP: No components found with this basename: POCBNP,  D-Dimer: No results found for this basename: DDIMER,  in the last 72 hours Hemoglobin A1C: No results found for this basename: HGBA1C,  in the last 72 hours Fasting Lipid Panel: No results found for this basename: CHOL, HDL, LDLCALC, TRIG, CHOLHDL, LDLDIRECT,  in the last 72 hours Thyroid Function Tests: No results found for this basename: TSH, T4TOTAL, FREET3, T3FREE, THYROIDAB,  in the last 72 hours Anemia Panel:  Recent Labs  03/27/14 0003  FERRITIN 2431*  TIBC 154*  IRON 22*    RADIOLOGY: Dg Chest 2 View  03/23/2014   CLINICAL DATA:  Weakness, leg pain  EXAM: CHEST  2 VIEW  COMPARISON:  03/22/2014  FINDINGS: Borderline cardiomegaly. Small right pleural effusion with right basilar atelectasis. Central mild vascular congestion and mild perihilar interstitial prominence suspicious for mild interstitial edema. Bony thorax is unremarkable.  IMPRESSION: Small right pleural effusion with right basilar atelectasis. Central mild vascular congestion and mild perihilar interstitial prominence  suspicious for mild interstitial edema. No segmental infiltrate.   Electronically Signed   By: Lahoma Crocker M.D.   On: 03/23/2014 09:04   Dg Chest 2 View  03/22/2014   CLINICAL DATA:  Shortness of breath  EXAM: CHEST  2 VIEW  COMPARISON:  07/23/2013  FINDINGS: There is no focal parenchymal opacity, pleural effusion, or pneumothorax. Mild pulmonary vascular congestion. There is mild cardiomegaly.  The osseous structures are unremarkable.  IMPRESSION: Mild pulmonary vascular congestion. Otherwise no active cardiopulmonary disease.   Electronically Signed   By: Kathreen Devoid   On: 03/22/2014 12:14   Ct Angio Chest W/cm &/or Wo Cm  03/22/2014   CLINICAL DATA:  Short of breath.  Cough.  EXAM: CT ANGIOGRAPHY CHEST WITH CONTRAST  TECHNIQUE: Multidetector CT imaging of the chest was performed using the standard  protocol during bolus administration of intravenous contrast. Multiplanar CT image reconstructions and MIPs were obtained to evaluate the vascular anatomy.  CONTRAST:  42mL OMNIPAQUE IOHEXOL 350 MG/ML SOLN  COMPARISON:  09/12/2012  FINDINGS: No evidence of a pulmonary embolus.  Heart is mildly enlarged. There are minor coronary artery calcifications. Great vessels are normal in caliber.  There is mediastinal adenopathy. Multiple reference measurements were made. There is a 14 mm short axis node in the right superior mediastinum. There is a 19 mm short axis prevascular lymph node adjacent the left pulmonary artery. There is a 15 mm right subcarinal lymph node. There are several other prominent to mildly enlarged lymph nodes. Although there were prominent nodes previously, the larger nodes have increased in size. There are prominent hilar lymph nodes not well defined, and without change.  Small right pleural effusion.  No left effusion.  Stable calcified pleural plaque over the right hemidiaphragm.  Lungs show mild diffuse interstitial thickening and hazy and somewhat heterogeneous airspace opacities which have a central to upper lobe predominance. Interstitial thickening is greatest in the lung bases.  Limited evaluation of the upper abdomen shows gallstones but is otherwise unremarkable.  No osteoblastic or osteolytic lesions.  Review of the MIP images confirms the above findings.  IMPRESSION: 1. No evidence of a pulmonary embolism. 2. Mild cardiomegaly, interstitial thickening and hazy areas of airspace lung opacity, as well as a small right effusion, all suggests congestive heart failure. However, there also prominent and mildly enlarged mediastinal lymph nodes. These are likely reactive. The possibility of lung infection should be considered in the proper clinical setting.   Electronically Signed   By: Lajean Manes M.D.   On: 03/22/2014 13:09   Dg Knee Ap/lat W/sunrise Left  03/23/2014   CLINICAL DATA:  Left  knee pain and swelling for 3 days  EXAM: DG KNEE - 3 VIEWS  COMPARISON:  None.  FINDINGS: There is no evidence of fracture, dislocation, or joint effusion. There is no evidence of arthropathy or other focal bone abnormality. Soft tissues are unremarkable. Transverse patellar cleft incidentally noted. Vascular calcifications are noted.  IMPRESSION: Negative.   Electronically Signed   By: Conchita Paris M.D.   On: 03/23/2014 21:17    PHYSICAL EXAM General: NAD Neck: Thick, JVP 8 cm, no thyromegaly or thyroid nodule.  Lungs: Clear to auscultation bilaterally with normal respiratory effort. CV: Nondisplaced PMI.  Heart regular S1/S2, +S3, 3/6 HSM apex.  No peripheral edema.  No carotid bruit.  Normal pedal pulses.  Abdomen: Soft, nontender, no hepatosplenomegaly, no distention.  Neurologic: Alert and oriented x 3.  Psych: Normal affect. Extremities: No clubbing or cyanosis.  TELEMETRY: Reviewed telemetry pt in NSR  ASSESSMENT AND PLAN: 48 yo with ESRD on HD via left arm fistula and HTN presented with fever and dyspnea with evidence for CHF.  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  1. 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  Blood culture negative so far but had MSSA in 5/15 and 8/15.  He is getting Ancef IV now.  MRI head - No acute findings.  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  On Friday, he had left popliteal embolectomy and above to below knee popliteal bypass.  2. Mitral regurgitation: Severe by TEE.  Small vegetation noted on the posterior leaflet.  Possible perforation.  Seen by Dr Roxy Manns, given severe MR with CHF. Plan for MV repair/replacement next week.   - Pre-op LHC/RHC with no coronary disease but elevated right and left heart filling pressure with prominent V-waves in PCWP tracing.  CO was preserved.  3. CHF: Acute diastolic CHF in the setting of severe MR.  Fluid  removal via HD.  Will work on afterload control by titrating up meds.  No spiro or ace due to ESRD. Continue hydralazine 75 mg tid and amlodipine.  4.  ESRD- per Nephrology   CLEGG,AMY NP-C  03/29/2014 8:55 AM  Patient seen with NP, agree with the above note.  Patient will need MV repair/replacement, planned for next week.  Will optimize vasodilators prior and will also need HD for fluid removal (elevated filling pressures on RHC on Monday).   Loralie Champagne 03/29/2014 1:49 PM

## 2014-03-29 NOTE — Consult Note (Signed)
DENTAL CONSULTATION  Date of Consultation:  03/29/2014 Patient Name:   Timothy Palmer Date of Birth:   05-23-1966 Medical Record Number: [id number]  VITALS: BP 141/87  Pulse 84  Temp(Src) 98.1 F (36.7 C) (Oral)  Resp 29  Ht 5\' 11"  (1.803 m)  Wt 226 lb 6.6 oz (102.7 kg)  BMI 31.59 kg/m2  SpO2 99%   CHIEF COMPLAINT: The patient was referred by Dr. Roxy Manns for a medically necessary pre-heart valve surgery dental protocol evaluation.  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  Patient with anticipated mitral valve replacement repair with Dr. Roxy Manns. Patient now seen as part of a medically necessary pre-heart valve surgery dental protocol to rule out dental infection that may affect the patient's systemic health anticipated heart valve surgery.  The patient currently denies acute toothache, swellings, or abscesses. Patient was last seen" years ago" to have several teeth pulled. Patient denies any complications associated with the dental extractions. Patient has no regular primary dentist. Patient denies having any acute dental problems or loose teeth.  PROBLEM LIST: Patient Active Problem List   Diagnosis Date Noted  . Septic embolism to left lower extremity 03/25/2014  . 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 Chronic diastolic congestive heart failure   . Mitral valve vegetation 03/23/2014  . Severe mitral regurgitation 03/23/2014  . Hypertension 03/22/2014  . Acute bronchitis 03/22/2014  . T wave inversion in EKG 03/22/2014  . QT prolongation 03/22/2014  . Acute combined systolic and diastolic congestive heart failure 03/21/2014  . 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 Knee pain, left 03/21/2014  . DVT of upper  extremity (deep vein thrombosis) 03/04/2014  . Positive blood culture 11/27/2013  . End stage renal disease 06/30/2013    PMH: Past Medical History  Diagnosis Date  . Hypertension   . Peripheral vascular disease   . Pneumonia 2014  . ESRD (end stage renal disease) on dialysis     [place],D[facility]- T,Th,S  . Acute on chronic diastolic heart failure [id number]  . Severe mitral regurgitation 03/23/2014    by TEE  . Positive blood culture 11/27/2013    MSSA  . Positive blood culture 02/24/2014    MSSA  . DVT of upper extremity (deep vein thrombosis) 03/04/2014    Right arm  . Knee pain, left 03/21/2014  . Acute combined systolic and diastolic congestive heart failure 03/21/2014  . Chronic diastolic congestive heart failure   . 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 Septic embolism to left lower extremity 03/25/2014  . 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 The aneurysm measures 1.4 x 1.1 cm which is  significantly larger than the 4.5 mm parent vessel.   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 Av fistula placement Left ?2005    forearm   . Shuntogram Left November 04, 2011  . Patch angioplasty Left 07/23/2013    Procedure: PATCH ANGIOPLASTY- LEFT RADIOCEPHALIC ARTERIOVENOUS FISTULA;  Surgeon: Angelia Mould, MD;  Location: [place];  Service: Vascular;  Laterality: Left;  . Tee without  cardioversion N/A 03/24/2014    Procedure: TRANSESOPHAGEAL ECHOCARDIOGRAM (TEE);  Surgeon: Larey Dresser, MD;  Location[facility] ENDOSCOPY;  Service: Cardiovascular;  Laterality: N/A;  . Embolectomy Left 03/25/2014    Procedure: EMBOLECTOMY left popliteal;  Surgeon: Rosetta Posner, MD;  Location: [place];  Service: Vascular;   Laterality: Left;  . Femoral-popliteal bypass graft Left 03/25/2014    Procedure: Left Femoral- Below Knee Popliteal Bypass Graft;  Surgeon: Rosetta Posner, MD;  Location: [facility] OR;  Service: Vascular;  Laterality: Left;    ALLERGIES: No Known Allergies  MEDICATIONS: Current Facility-Administered Medications  Medication Dose Route Frequency Provider Last Rate Last Dose  . 0.9 %  sodium chloride infusion  250 mL Intravenous PRN Otho Bellows, MD      . 0.9 %  sodium chloride infusion   Intravenous Continuous Kate Sable, MD 10 mL/hr at 03/25/14 1338 10 mL/hr at 03/25/14 1338  . 0.9 %  sodium chloride infusion   Intravenous Once Ramiro Harvest, PA-C      . acetaminophen (TYLENOL) tablet 650 mg  650 mg Oral Q4H PRN Larey Dresser, MD   650 mg at 03/29/14 0252  . amLODipine (NORVASC) tablet 10 mg  10 mg Oral QPM Otho Bellows, MD      . ceFAZolin (ANCEF) IVPB 2 g/50 mL premix  2 g Intravenous Q T,Th,Sat-1800 Campbell Riches, MD   2 g at 03/26/14 1836  . chlorhexidine (PERIDEX) 0.12 % solution 15 mL  15 mL Mouth/Throat BID Rexene Alberts, MD   15 mL at 03/28/14 1048  . cinacalcet (SENSIPAR) tablet 60 mg  60 mg Oral QPC supper Marlena Clipper, NP   60 mg at 03/28/14 2250  . darbepoetin (ARANESP) injection 40 mcg  40 mcg Intravenous Q Thu-HD Marlena Clipper, NP      . heparin injection 5,000 Units  5,000 Units Subcutaneous 3 times per day Larey Dresser, MD   5,000 Units at 03/29/14 0501  . hydrALAZINE (APRESOLINE) tablet 50 mg  50 mg Oral 3 times per day Larey Dresser, MD   50 mg at 03/29/14 0501  . ondansetron (ZOFRAN) injection 4 mg  4 mg Intravenous Q6H PRN Larey Dresser, MD      . sevelamer carbonate (RENVELA) tablet 800 mg  800 mg Oral TID WC Otho Bellows, MD   800 mg at 03/28/14 2251  . sodium chloride 0.9 % injection 3 mL  3 mL Intravenous Q12H Otho Bellows, MD   3 mL at 03/28/14 1000  . sodium chloride 0.9 % injection 3 mL  3 mL Intravenous Q12H Otho Bellows, MD    3 mL at 03/28/14 2251  . sodium chloride 0.9 % injection 3 mL  3 mL Intravenous PRN Otho Bellows, MD        LABS: Lab Results  Component Value Date   WBC 10.2 03/29/2014   HGB 9.2* 03/29/2014   HCT 27.5* 03/29/2014   MCV 90.8 03/29/2014   PLT 255 03/29/2014      Component Value Date/Time   NA 138 03/29/2014 0226   K 4.2 03/29/2014 0226   CL 96 03/29/2014 0226   CO2 26 03/29/2014 0226   GLUCOSE 94 03/29/2014 0226   BUN 22 03/29/2014 0226   CREATININE 7.27* 03/29/2014 0226   CALCIUM 10.3 03/29/2014 0226   GFRNONAA 8* 03/29/2014 0226   GFRAA 9* 03/29/2014 0226   Lab Results  Component Value Date   INR  1.19 03/27/2014   No results found for this basename: PTT    SOCIAL HISTORY: History   Social History  . Marital Status: Single    Spouse Name: N/A    Number of Children: N/A  . Years of Education: N/A   Occupational History  . Not on file.   Social History Main Topics  . Smoking status: Never Smoker   . Smokeless tobacco: Never Used  . Alcohol Use: Yes     Comment: 03/23/2014 "glass or 2 of gin twice/month"  . Drug Use: Yes    Special: Marijuana     Comment: last used 03/20/14  . Sexual Activity: No   Other Topics Concern  . Not on file   Social History Narrative  . No narrative on file    FAMILY HISTORY: Family History  Problem Relation Age of Onset  . Diabetes Mother   . Hypertension Mother   . Hypertension Father   . Hypertension Sister   . Hypertension Brother      REVIEW OF SYSTEMS: Reviewed from chart for this admission.  DENTAL HISTORY: CHIEF COMPLAINT: The patient was referred by Dr. Roxy Manns for a medically necessary pre-heart valve surgery dental protocol evaluation.  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  Patient with anticipated mitral valve replacement repair with Dr. Roxy Manns. Patient now seen as part of a medically necessary pre-heart valve  surgery dental protocol to rule out dental infection that may affect the patient's systemic health anticipated heart valve surgery.  The patient currently denies acute toothache, swellings, or abscesses. Patient was last seen" years ago" to have several teeth pulled. Patient denies any complications associated with the dental extractions. Patient has no regular primary dentist. Patient denies having any acute dental problems or loose teeth.  DENTAL EXAMINATION:  GENERAL: The patient well-developed, well-nourished male in no acute distress. HEAD AND NECK: There is no submandibular lymphadenopathy. The patient denies acute TMJ symptoms. INTRAORAL EXAM: The patient has normal saliva. I do not see any evidence of abscess formation. DENTITION: Patient is missing tooth numbers 8, 9, 12, 13, 18, 24, 25. PERIODONTAL: The patient has chronic periodontitis with plaque and calculus accumulations, selective areas gingival recession, and no significant tooth mobility. DENTAL CARIES/SUBOPTIMAL RESTORATIONS: No obvious dental caries noted at this time. I would need a full series of dental radiographs to rule out other incipient dental caries. ENDODONTIC: Patient currently denies acute pulpitis symptoms. I do not see any periapical pathology or radiolucency. CROWN AND BRIDGE: There are no crown or bridge restorations. PROSTHODONTIC: Patient denies having any partial dentures. OCCLUSION: Patient has a poor occlusal scheme secondary to multiple missing teeth, supra-eruption and drifting of the unopposed teeth into the edentulous areas, and lack of replacement of  missing teeth with dental prostheses.  RADIOGRAPHIC INTERPRETATION: An orthopantogram was obtained on 03/25/2014. There are Multiple missing teeth. There is supra-eruption and drifting of the unopposed teeth into the edentulous areas. There is incipient to moderate bone loss. No obvious periapical pathology or radiolucencies are noted. No obvious dental  caries are noted.   ASSESSMENTS: 1. Bacterial endocarditis with MSSA 2. Severe mitral regurgitation with mitral valve vegetations 3. Pre-heart valve surgery dental protocol 4. Chronic periodontitis 5. Accretions 6. Gingival recession 7. Multiple missing teeth 8. Supra-eruption and drifting of the unopposed teeth into the edentulous areas 9. Malocclusion 10. No history of partial dentures  PLAN/RECOMMENDATIONS: 1. I discussed the risks, benefits, and complications  of various treatment options with the patient in relationship to his medical and dental conditions, anticipated heart valve surgery, and bacterial endocarditis.  We discussed various treatment options to include no treatment or possible periodontal therapy prior to anticipated heart valve surgery. We discussed the potential for future extractions with alveoloplasty, pre-prosthetic surgery as indicated, continued periodontal therapy, dental restorations, root canal therapy, crown and bridge therapy, implant therapy, and replacement of missing teeth as indicated. The patient currently wishes to defer any dental treatment at this time and will proceed with heart valve surgery as scheduled. In the meantime, patient will improve oral hygiene techniques to minimize the amount of plaque around his teeth. The patient is to also remain on his chlorhexidine rinses twice daily for the next 3-4 months. Patient understands that he will need to followup with a general dentist for exam, radiographs, and discussion of other dental treatment needs once medically stable from his anticipated heart valve surgery.  The patient understands that he will need antibiotic premedication prior to this invasive dental procedures due to his anticipated heart valve surgery.   2. Discussion of findings with medical team and coordination of future medical and dental care as needed.   Lenn Cal, DDS

## 2014-03-29 NOTE — Progress Notes (Signed)
Timothy Palmer MPN:[id number] DOB: [birth year] DOA: 03/22/2014 PCP: Louis Meckel, MD Assessment/ Plan:   48yo M w/ PMH HTN and ESRD on HD presents to the ED with increasing SOB in the setting of a productive cough and fever and was found to have endocarditis.   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 TTE showed a mobile vegetation on the mitral valve and possible perforation.  Per Nephrology, +MSSA blood culture 5/15 and 02/2014, although negative 01/2014; treated with cefazolin 2g with HD starting 8/20 to continue until 10/20. Initially started on Vanc/Zosyn which was transitioned to Ancef given recent cultures. 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 Also with 4cm probable septic abscess in upper pole of the spleen.  03/24/2014 TEE >> MV vegetation with severe mitral regurgitation. 03/25/2014 >>Left popliteal embolectomy and intraoperative arteriogram.  03/25/2014 >>Above-knee to below-knee popliteal bypass with reversed great saphenous vein 03/28/2014 >>Right Heart Cath, Left Heart Cath, Selective Coronary Angiography. Revealed elevated left and right heart filling pressures with preserved cardiac output. Primarily pulmonary venous hypertension but also suspect with PVR 4 that there is a component of perhaps reactive pulmonary vasoconstriction in the setting of severe MR. Prominent V-waves in PCWP tracing. No angiographic coronary disease.   CT Surgery is planning on vale replacement. Has been evaluated by dental and not dental procedure planned.   Plan  - Continuing Ancef for 6 weeks from mitral valve replacement, per ID.  - ID consult, appreciate recommendations  - Cardiology consult, appreciate recommendations  - CT Surgery consult, appreciate recommendations  - VVS consult, appreciate recommendations  - Blood  cultures, NGTD  - will ask PT see him today  Anemia: Normocytic. Likely acute blood loss anemia on top of anemia of chronic kidney disease. Hb down to 7.0 post-operatively from 8.2 with a baseline around 11. Appropriate bump to 9.0 s/p 2upRBC in HD. 9/20 Iron studies revealed low serum iron with low TIBC and very high ferritin; c/w anemia of chronic renal disease. Plan  - monitor with daily CBCs  - continue aranesp 40 mcg IV every thusday dialysis - Hgb goal >7-8   Acute heart failure: CXR and CTA concerning for CHF. As noted above TTE and TTE with mitral valve vegetation and severe regurgitation, also concern for perforation. Normal EF. Right and left cardiac cath 03/28/2014 as noted above with normal coronaries but with increased right sided pressure ? Primary PHTN. CT Surgery to perform MVR. Plan - Pt undergoing HD TThSa with fluid removal - had an extra HD session on 9/21 for remove excess fluid.   End stage renal disease on HD: HD TThSa. Last dialysis 9/19, 1L removed. Pt below dry weight, renal lowering dry weight. No acute electrolyte abnormalities. Plan  - cont with optimizing volume status before surgery  - had off schedule HD session on 9/21.  - F/u with Nephrology, appreciate recommendations.   Hypertension: BP remain mildly elevated. Pt on ACEi at home which was continued on admission. Amlodipine started this admission. Oral Hydralazine started by Cardiology.  - Increasing Amlodipine to 74m daily  - Continue Lisinopril 255mdaily  - further increased hydralazine from 5089mo 97m48mh   T wave inversion in EKG: Improved. New diffuse T-wave inversions on EKG on admission. Troponin negative x3. Cardiology curbsided in the ED and felt changes due to electolyte abnormalities. Electrolytes including  K and Mg have been replaced.  Plan  - monitor clinically  - bmet daily for lytes  QT prolongation: Resolved. QTc >600 when previously normal. Improved on repeat. Avoiding QTc  prolonging drugs.    F/E/N: renal diet with fluid restriction (<1000 cc/day)  VTE Ppx: Heparin SubQ  CODE STATUS: Full Family Communication: Discussed with patient about plan of care.   Disposition: Disposition will be determined after surgery for MV replacement.    The patient does have current PCP (GOLDSBOROUGH,KELLIE A, MD), therefore is require OPC follow-up after discharge.   The patient does not have transportation limitations that hinder transportation to clinic appointments.  .Services Needed at time of discharge: Y = Yes, Blank = No PT:   OT:   RN:   Equipment:   Other:    Length of Stay: 7 days   Subjective/Interval Events:    Subjective:  Reporting pain at his left knee cap which started last night. No trauma  Interval Events: HD yesterday  Having another HD session today  Has been seen by dental who do not recommend any interventions at this time    Objective:     Last BM Date: 03/21/14   Weights: 24-hour Weight change: 14.1 oz (0.4 kg)  Filed Weights   03/28/14 2000 03/29/14 0455 03/29/14 0851  Weight: 228 lb 13.4 oz (103.8 kg) 226 lb 6.6 oz (102.7 kg) 225 lb 8.5 oz (102.3 kg)     Intake/Output:   Intake/Output Summary (Last 24 hours) at 03/29/14 1106 Last data filed at 03/28/14 2315  Gross per 24 hour  Intake    320 ml  Output    225 ml  Net     95 ml       Physical Exam: Vital Signs:   Temp:  [97.7 F (36.5 C)-98.5 F (36.9 C)] 98 F (36.7 C) (09/22 0700) Pulse Rate:  [79-92] 83 (09/22 0930) Resp:  [22-34] 26 (09/22 0930) BP: (130-183)/(70-93) 145/85 mmHg (09/22 0930) SpO2:  [95 %-100 %] 99 % (09/22 0902) Weight:  [225 lb 8.5 oz (102.3 kg)-231 lb 11.3 oz (105.1 kg)] 225 lb 8.5 oz (102.3 kg) (09/22 0851) General: Vital signs reviewed. No distress.  Lungs: Clear to auscultation bilaterally  Heart: tachycardia. RRR; no extra sounds. Mitral area with grade 4 holosystolic murmur Abdomen: Bowel sounds present, soft, nontender; no  hepatosplenomegaly  Extremities: Nontender on palpation of L patellar tendon. No bilateral ankle edema. Left leg surgical scan look clean and dry. No signs of infection.   Neurologic: Alert and oriented x3. Moves all extremities  Labs: Basic Metabolic Panel:  Recent Labs Lab 03/22/14 1142 03/22/14 1349  03/24/14 1300 03/26/14 0050 03/26/14 0340 03/27/14 1457 03/28/14 0340 03/29/14 0226  NA 140  --   < > 139 138 138 138 135* 138  K 3.6*  --   < > 4.7 4.8 5.1 4.0 3.9 4.2  CL 94*  --   < > 94* 96 97 97 95* 96  CO2 33*  --   < > '26 26 25 28 24 26  ' GLUCOSE 105*  --   < > 141* 100* 96 145* 106* 94  BUN 12  --   < > 48* 34* 36* 27* 31* 22  CREATININE 5.85*  --   < > 12.46* 9.52* 9.64* 7.77* 9.12* 7.27*  CALCIUM 9.8  --   < > 10.3 10.3 10.2 10.8* 10.7* 10.3  MG  --  1.8  --   --   --   --   --   --   --  PHOS  --   --   < > 7.2* 7.4* 7.5*  --  6.8* 4.7*  < > = values in this interval not displayed.  Liver Function Tests:  Recent Labs Lab 03/24/14 1300 03/26/14 0050 03/26/14 0340 03/28/14 0340 03/29/14 0226  ALBUMIN 2.3* 2.3* 2.1* 1.9* 2.1*     CBC:  Recent Labs Lab 03/26/14 1316 03/26/14 2347 03/27/14 1457 03/28/14 0340 03/29/14 0226  WBC 12.0* 12.8* 13.0* 12.6* 10.2  HGB 7.0* 9.0* 8.8* 8.8* 9.2*  HCT 21.4* 27.2* 26.2* 26.1* 27.5*  MCV 92.6 90.7 90.7 90.6 90.8  PLT 257 230 229 243 255    Cardiac Enzymes:  Recent Labs Lab 03/22/14 1922 03/23/14 0014 03/23/14 0612  TROPONINI <0.30 <0.30 <0.30     CBG: No results found for this basename: GLUCAP,  in the last 168 hours  Coagulation Studies:  Recent Labs  03/27/14 1905  LABPROT 15.1  INR 1.19    Microbiology: Results for orders placed during the hospital encounter of 03/22/14  CULTURE, BLOOD (ROUTINE X 2)     Status: None   Collection Time    03/22/14  1:52 PM      Result Value Ref Range Status   Specimen Description BLOOD ARM RIGHT   Final   Special Requests BOTTLES DRAWN AEROBIC ONLY 10CC    Final   Culture  Setup Time     Final   Value: 03/22/2014 18:55     Performed at [facility]   Culture     Final   Value: NO GROWTH 5 DAYS     Performed at [facility]   Report Status 03/28/2014 FINAL   Final  CULTURE, BLOOD (ROUTINE X 2)     Status: None   Collection Time    03/22/14  2:00 PM      Result Value Ref Range Status   Specimen Description BLOOD HAND RIGHT   Final   Special Requests BOTTLES DRAWN AEROBIC AND ANAEROBIC 10CC   Final   Culture  Setup Time     Final   Value: 03/22/2014 18:56     Performed at [facility]   Culture     Final   Value: NO GROWTH 5 DAYS     Performed at [facility]   Report Status 03/28/2014 FINAL   Final  RESPIRATORY VIRUS PANEL     Status: None   Collection Time    03/22/14  9:22 PM      Result Value Ref Range Status   Source - RVPAN NASOPHARYNGEAL   Final   Respiratory Syncytial Virus A NOT DETECTED   Final   Respiratory Syncytial Virus B NOT DETECTED   Final   Influenza A NOT DETECTED   Final   Influenza B NOT DETECTED   Final   Parainfluenza 1 NOT DETECTED   Final   Parainfluenza 2 NOT DETECTED   Final   Parainfluenza 3 NOT DETECTED   Final   Metapneumovirus NOT DETECTED   Final   Rhinovirus NOT DETECTED   Final   Adenovirus NOT DETECTED   Final   Influenza A H1 NOT DETECTED   Final   Influenza A H3 NOT DETECTED   Final   Comment: (NOTE)           Normal Reference Range for each Analyte: NOT DETECTED     Testing performed using the Luminex xTAG Respiratory Viral Panel test     kit.     The analytical  performance characteristics of this assay have been     determined by [facility].  The modifications have not been     cleared or approved by the FDA. This assay has been validated pursuant     to the CLIA regulations and is used for clinical purposes.     Performed at [facility], BLOOD (ROUTINE X 2)     Status: None   Collection Time    03/23/14  7:20 PM       Result Value Ref Range Status   Specimen Description BLOOD RIGHT HAND   Final   Special Requests BOTTLES DRAWN AEROBIC AND ANAEROBIC 5CC   Final   Culture  Setup Time     Final   Value: 03/23/2014 22:25     Performed at [facility]   Culture     Final   Value: NO GROWTH 5 DAYS     Performed at [facility]   Report Status 03/29/2014 FINAL   Final  ANAEROBIC CULTURE     Status: None   Collection Time    03/25/14  3:21 PM      Result Value Ref Range Status   Specimen Description WOUND   Final   Special Requests LEFT POPITEAL EMBOLIUS PT ON ZINACEF   Final   Gram Stain PENDING   Incomplete   Culture     Final   Value: NO ANAEROBES ISOLATED; CULTURE IN PROGRESS FOR 5 DAYS     Performed at [facility]   Report Status PENDING   Incomplete  WOUND CULTURE     Status: None   Collection Time    03/25/14  3:21 PM      Result Value Ref Range Status   Specimen Description WOUND   Final   Special Requests LEFT POPITEAL EMBOLIUS PT ON ZINCEF   Final   Gram Stain     Final   Value: NO WBC SEEN     NO SQUAMOUS EPITHELIAL CELLS SEEN     NO ORGANISMS SEEN     Performed at [facility]   Culture     Final   Value: NO GROWTH 2 DAYS     Performed at [facility]   Report Status 03/28/2014 FINAL   Final  MRSA PCR SCREENING     Status: None   Collection Time    03/26/14  6:31 AM      Result Value Ref Range Status   MRSA by PCR NEGATIVE  NEGATIVE Final   Comment:            The GeneXpert MRSA Assay (FDA     approved for NASAL specimens     only), is one component of a     comprehensive MRSA colonization     surveillance program. It is not     intended to diagnose MRSA     infection nor to guide or     monitor treatment for     MRSA infections.     Imaging: Dg Chest 2 View  03/29/2014   CLINICAL DATA:  Shortness of breath.  EXAM: CHEST  2 VIEW  COMPARISON:  PA and lateral chest 03/25/2014 and 03/22/2014. CT chest 03/22/2014.  FINDINGS:  Cardiomegaly is again seen. Mild interstitial edema seen on the most recent examination has improved. Small right pleural effusion is also decreased. No pneumothorax.  IMPRESSION: Improved interstitial edema and small right pleural effusion.  Cardiomegaly.   Electronically Signed  By: Inge Rise M.D.   On: 03/29/2014 08:10      Medications:    Infusions: . sodium chloride 10 mL/hr (03/25/14 1338)     Scheduled Medications: . sodium chloride   Intravenous Once  . amLODipine  10 mg Oral QPM  .  ceFAZolin (ANCEF) IV  2 g Intravenous Q T,Th,Sat-1800  . chlorhexidine  15 mL Mouth/Throat BID  . cinacalcet  60 mg Oral QPC supper  . darbepoetin (ARANESP) injection - DIALYSIS  40 mcg Intravenous Q Thu-HD  . heparin  5,000 Units Subcutaneous 3 times per day  . hydrALAZINE  75 mg Oral 3 times per day  . sevelamer carbonate  800 mg Oral TID WC  . sodium chloride  3 mL Intravenous Q12H  . sodium chloride  3 mL Intravenous Q12H     PRN Medications: sodium chloride, acetaminophen, ondansetron (ZOFRAN) IV, sodium chloride  Principal Problem:   Bacterial endocarditis - MSSA positive blood cultures with mitral valve vegetation, severe mitral regurgitation, and septic embolization Active Problems:   End stage renal disease   Hypertension   Acute bronchitis   T wave inversion in EKG   QT prolongation   Mitral valve vegetation   Acute combined systolic and diastolic congestive heart failure   Severe mitral regurgitation   Positive blood culture   Knee pain, left   Chronic diastolic congestive heart failure   Septic embolism to left lower extremity   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

## 2014-03-29 NOTE — Progress Notes (Addendum)
  Progress Note    03/29/2014 7:45 AM 1 Day Post-Op  Subjective:  States he has some soreness in his left leg around the knee cap.  Afebrile HR 80's 0000000 systolic [id number]   Filed Vitals:   03/29/14 0500  BP: 141/87  Pulse: 84  Temp: 98.1 F (36.7 C)  Resp:     Physical Exam: Lungs:  Non labored Incisions:  LLE incisions are healing with steri strips in place; right groin is soft s/p cardiac cath Extremities:  + palpable DP bilaterally + palpable left PT  CBC    Component Value Date/Time   WBC 10.2 03/29/2014 0226   RBC 3.03* 03/29/2014 0226   HGB 9.2* 03/29/2014 0226   HCT 27.5* 03/29/2014 0226   PLT 255 03/29/2014 0226   MCV 90.8 03/29/2014 0226   MCH 30.4 03/29/2014 0226   MCHC 33.5 03/29/2014 0226   RDW 15.2 03/29/2014 0226   LYMPHSABS 2.4 09/12/2012 1445   MONOABS 1.1* 09/12/2012 1445   EOSABS 0.1 09/12/2012 1445   BASOSABS 0.0 09/12/2012 1445    BMET    Component Value Date/Time   NA 138 03/29/2014 0226   K 4.2 03/29/2014 0226   CL 96 03/29/2014 0226   CO2 26 03/29/2014 0226   GLUCOSE 94 03/29/2014 0226   BUN 22 03/29/2014 0226   CREATININE 7.27* 03/29/2014 0226   CALCIUM 10.3 03/29/2014 0226   GFRNONAA 8* 03/29/2014 0226   GFRAA 9* 03/29/2014 0226    INR    Component Value Date/Time   INR 1.19 03/27/2014 1905     Intake/Output Summary (Last 24 hours) at 03/29/14 0745 Last data filed at 03/28/14 2315  Gross per 24 hour  Intake    320 ml  Output    225 ml  Net     95 ml     Assessment:  48 y.o. male is s/p:  Left popliteal embolectomy and intraoperative arteriogram  &  Above-knee to below-knee popliteal bypass with reversed great saphenous vein   3 Day Post-Op  Plan: -pt doing well this am from vascular standpoint. -awaiting plan for upcoming cardiac surgery. -DVT prophylaxis:  Heparin SQ   Leontine Locket, PA-C [facility] [phone] 03/29/2014 7:45 AM    I have examined the patient, reviewed and agree with above.  Stable postop day 4 from left above-knee to below-knee popliteal bypass. Stable from vascular standpoint. 2-3+ left dorsalis pedis pulse  EARLY, TODD, MD 03/29/2014 8:29 AM

## 2014-03-29 NOTE — Progress Notes (Signed)
LOS: 7 days   Subjective: - complaining of anterior knee pain, sharp when moving leg around. Pain at incision sites stable - complaining of some intermittent burning numbness on lateral aspect of right thigh - Reports having eaten a significant amount ice chips in last few days but states understanding of restricting fluid in anticipation of surgery   Objective: BP 145/85  Pulse 83  Temp(Src) 98 F (36.7 C) (Oral)  Resp 26  Ht 5\' 11"  (1.803 m)  Wt 102.3 kg (225 lb 8.5 oz)  BMI 31.47 kg/m2  SpO2 99%  Intake/Output Summary (Last 24 hours) at 03/29/14 1035 Last data filed at 03/28/14 2315  Gross per 24 hour  Intake    320 ml  Output    225 ml  Net     95 ml    Physical Exam: GEN: no notable diaphoresis, awake and oriented, in mild distress EYES: EOMI CV: II/VI holosystolic murmur greatest at mid-clavicular line, tachycardic, S1/S2 no gallop PULM: Some fine crackles at R base but otherwise clear crackles ABD: soft, NT/ND, +BS EXT: No edema, 2+ pulses bilaterally LE, much improved L sided lower extremity pulse s/p pop bypass MSK: no reproducible pain to palpation of L patellar tendon or right thigh  Labs/Studies: I have reviewed labs and studies from last 24hrs per EMR.  Medications: I have reviewed the patient's current medications.  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   MSSA MV Infective Endocarditis: Per nephrology notes, had cultures positive for MSSA recently and had been treated with cefazolin qHD. Being followed by cards, ID, CT surgery, vascular surgery; appreciate all recommendations.  Diagnostic Timeline *9/16 ECHO: small mobile vegetation on posterior leaflet *9/17 TEE: severe MR *9/18 MRI head negative for embolic events.  *9/18 MRI L knee: muscle injury posteriorly with edema in the biceps femoris, medial head gastrocnemius, and lateral head gastrocnemius; per ortho not a surgical issue.  *9/21 L&RHC: Increased L and R sided filling pressure, severe MR, no coronary disease; prominent V-waves in PCWP tracing *9/22 completed dental c/s, had orthopanthogram, deferred any treatment prior to surgery but stated understanding of need for pre-dental procedure abx. - per ID, ancef 2g qHD (9/17-) s/p vanc/cefepime (9/15-9/17); will need 6 weeks of abx coverage starting on date of anticipated surgery  - per cardiac surgery; plan to removed as much fluid as possible with HD this week to reduce filling pressures prior to tentative MV repair vs replacement next week - PT to increase mobility and condition pt in preparation for surgery next week  L popliteal mycotic embol s/p embolectomy/bypass: L popliteal embolic found on 0000000 Ao and Bifem CTA. Went to OR with Dr. Donnetta Hutching (vasc surg) on 9/18 for embolectomy followed by above to below the knee reversed  saphenous vein bypass. 2-3+ BLE pulses post-operatively. CTA also revealed SMA mycotic aneurysm and probable 4.1 cm septic abscess vs hemangioma. No abdominal complaints or exam findings.  - CTM  Anemia: Normocytic. Likely acute blood loss anemia on top of anemia of chronic kidney disease. Hb down to 7.0 post-operatively from 8.2 with a baseline around 11. Appropriate  bump to 9.0 s/p 2upRBC in HD. 9/20 Iron studies revealed low serum iron with low TIBC and very high ferritin; c/w anemia of chronic renal disease.  - continue aranesp 40 mcg IV every thusday dialysis  Hypertension: Goaling afterload reduction in anticipation of MV surgery. - per cards- hydral 75 TID, amlodipine 10; lisinopril 20 d/c 9/21  ERSD: On TTS dialysis. Reports dry wt 103 kg.  - Nephrology c/s; appreciate recs - ESA qTh; Renvela, Sensipar  Diet: Per RD, Provide Nepro Shake po BID  Dispo: Floor status  This is a Careers information officer Note.  The care of the patient was discussed with Dr. Alice Rieger and the assessment and plan formulated with their assistance.  Please see their attached note for official documentation of the daily encounter.

## 2014-03-29 NOTE — Care Management Note (Addendum)
    Page 1 of 2   04/15/2014     5:32:05 PM CARE MANAGEMENT NOTE 04/15/2014  Patient:  Timothy Palmer, Timothy Palmer   Account Number:  [id number]  Date Initiated:  03/23/2014  Documentation initiated by:  [facility]  Subjective/Objective Assessment:   HCAP     Action/Plan:   PTA pt lived at home alone- NCM to follow for d/c needs   Anticipated DC Date:  04/15/2014   Anticipated DC Plan:  [place]  CM consult      Choice offered to / List presented to:             Status of service:  In process, will continue to follow Medicare Important Message given?  YES (If response is "NO", the following Medicare IM given date fields will be blank) Date Medicare IM given:  04/04/2014 Medicare IM given by:  HUTCHINSON,CRYSTAL Date Additional Medicare IM given:  04/15/2014 Additional Medicare IM given by:  Cana Mignano  Discharge Disposition:  HOME/SELF CARE  Per UR Regulation:  Reviewed for med. necessity/level of care/duration of stay  If discussed at [place] of Stay Meetings, dates discussed:   03/29/2014  03/31/2014  04/05/2014  04/07/2014  04/12/2014    Comments:  04/15/14 Ellan Lambert, RN, BSN [phone] Pt for dc today; will receive IV abx during HD.  Pt denies any needs for home.  Has RW at home, if needed.  04/12/14 Ellan Lambert, Rn, BSN [phone] Pt progressing well with ambulation; walked 865feet today. Will dc home with sister at dc.  Will cont to follow progress.  Per notes, will need IV abx x 6 weeks post valve replacement at dc.   MD/PA, if this is correct, please leave orders for [facility] and IV abx regimen needed at dc. Thanks.   04/08/14 0840 Henrietta Mayo RN MSN BSN CCM S/P MVR/reexploration for bleed.  Pt OOB to chair, amio gtt to PO.  Crystal Hutchinson RN, BSN, MSHL, CCM  Nurse - Case Manager,  ([phone]  04/01/2014 Patient received in transferr to [place]. MVR surgery scheduled for 04/05/14 Social:  Home  alone.  Ambulated via RW d/t Knee issues and pain. PT RECS:  ______Pending and TBD post surgery HD patient Dispo Plan:  pending.   03/28/14- 1500- Marvetta Gibbons RN, BSN [phone] Pt with MSSA bacteremia with mitral valve endocarditis and mycotic emboli: had heart cath today, also s/p Left popliteal embolectomy and intraoperative arteriogram & Above-knee to below-knee popliteal bypass done on 03/25/14- CT Surgery is planning on vale replacement unsure of timing. Continuing Ancef for 6 weeks from mitral valve replacement, per ID with HD.

## 2014-03-29 NOTE — Progress Notes (Signed)
INFECTIOUS DISEASE PROGRESS NOTE  ID: Timothy Palmer is a 48 y.o. 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 03/25/14 1249 03/26/14 0559   03/24/14 1800  ceFEPIme (MAXIPIME) 2 g in dextrose 5 % 50 mL IVPB  Status:  Discontinued     2 g 100 mL/hr over 30 Minutes Intravenous Every T-Th-Sa (1800) 03/23/14 1108 03/24/14 1505   03/24/14 1800  ceFAZolin (ANCEF) IVPB 2 g/50 mL premix     2 g 100 mL/hr over 30 Minutes Intravenous Every T-Th-Sa (1800) 03/24/14 1505     03/24/14 1200  ceFEPIme (MAXIPIME) 2 g in dextrose 5 % 50 mL IVPB  Status:  Discontinued     2 g 100 mL/hr over 30 Minutes Intravenous Every T-Th-Sa (Hemodialysis) 03/22/14 1355 03/23/14 1108   03/24/14 1200  vancomycin (VANCOCIN) IVPB 1000 mg/200 mL premix  Status:  Discontinued     1,000 mg 200 mL/hr over 60 Minutes Intravenous Every T-Th-Sa (Hemodialysis) 03/22/14 2232 03/24/14 1505   03/22/14 1400  vancomycin (VANCOCIN) 1,750 mg in sodium chloride 0.9 %  500 mL IVPB     1,750 mg 250 mL/hr over 120 Minutes Intravenous  Once 03/22/14 1346 03/22/14 1720   03/22/14 1400  ceFEPIme (MAXIPIME) 2 g in dextrose 5 % 50 mL IVPB     2 g 100 mL/hr over 30 Minutes Intravenous  Once 03/22/14 1347 03/22/14 1521   03/22/14 1345  vancomycin (VANCOCIN) 15 mg/kg in sodium chloride 0.9 % 100 mL IVPB  Status:  Discontinued     15 mg/kg 100 mL/hr over 60 Minutes Intravenous  Once 03/22/14 1342 03/22/14 1345      Medications:  Scheduled: . sodium chloride   Intravenous Once  . amLODipine  10 mg Oral QPM  .  ceFAZolin (ANCEF) IV  2 g Intravenous Q T,Th,Sat-1800  . chlorhexidine  15 mL Mouth/Throat BID  . cinacalcet  60 mg Oral QPC supper  . darbepoetin (ARANESP) injection - DIALYSIS  40 mcg Intravenous Q Thu-HD  . heparin  5,000 Units Subcutaneous 3 times per day  . hydrALAZINE  75 mg Oral 3 times per day  . sevelamer carbonate  800 mg Oral TID WC  . sodium chloride  3 mL Intravenous Q12H  . sodium chloride  3 mL Intravenous Q12H    Objective: Vital signs in last 24 hours: Temp:  [97.5 F (36.4 C)-98.5 F (36.9 C)] 97.8  F (36.6 C) (09/22 1653) Pulse Rate:  [79-90] 89 (09/22 1431) Resp:  [20-29] 23 (09/22 1431) BP: (119-183)/(54-93) 128/74 mmHg (09/22 1431) SpO2:  [95 %-100 %] 96 % (09/22 1431) Weight:  [97.5 kg (214 lb 15.2 oz)-103.8 kg (228 lb 13.4 oz)] 97.5 kg (214 lb 15.2 oz) (09/22 1310)   General appearance: alert, cooperative and no distress Resp: clear to auscultation bilaterally Cardio: regular rate and rhythm GI: normal findings: bowel sounds normal and soft, non-tender  Lab Results  Recent Labs  03/28/14 0340 03/29/14 0226  WBC 12.6* 10.2  HGB 8.8* 9.2*  HCT 26.1* 27.5*  NA 135* 138  K 3.9 4.2  CL 95* 96  CO2 24 26  BUN 31* 22  CREATININE 9.12* 7.27*   Liver Panel  Recent Labs  03/28/14 0340 03/29/14 0226  ALBUMIN 1.9* 2.1*   Sedimentation Rate No results found for this basename: ESRSEDRATE,  in the last 72  hours C-Reactive Protein No results found for this basename: CRP,  in the last 72 hours  Microbiology: Recent Results (from the past 240 hour(s))  CULTURE, BLOOD (ROUTINE X 2)     Status: None   Collection Time    03/22/14  1:52 PM      Result Value Ref Range Status   Specimen Description BLOOD ARM RIGHT   Final   Special Requests BOTTLES DRAWN AEROBIC ONLY 10CC   Final   Culture  Setup Time     Final   Value: 03/22/2014 18:55     Performed at [facility]   Culture     Final   Value: NO GROWTH 5 DAYS     Performed at [facility]   Report Status 03/28/2014 FINAL   Final  CULTURE, BLOOD (ROUTINE X 2)     Status: None   Collection Time    03/22/14  2:00 PM      Result Value Ref Range Status   Specimen Description BLOOD HAND RIGHT   Final   Special Requests BOTTLES DRAWN AEROBIC AND ANAEROBIC 10CC   Final   Culture  Setup Time     Final   Value: 03/22/2014 18:56     Performed at [facility]   Culture     Final   Value: NO GROWTH 5 DAYS     Performed at [facility]   Report Status 03/28/2014 FINAL   Final  RESPIRATORY VIRUS PANEL     Status: None   Collection Time    03/22/14  9:22 PM      Result Value Ref Range Status   Source - RVPAN NASOPHARYNGEAL   Final   Respiratory Syncytial Virus A NOT DETECTED   Final   Respiratory Syncytial Virus B NOT DETECTED   Final   Influenza A NOT DETECTED   Final   Influenza B NOT DETECTED   Final   Parainfluenza 1 NOT DETECTED   Final   Parainfluenza 2 NOT DETECTED   Final   Parainfluenza 3 NOT DETECTED   Final   Metapneumovirus NOT DETECTED   Final   Rhinovirus NOT DETECTED   Final   Adenovirus NOT DETECTED   Final   Influenza A H1 NOT DETECTED   Final   Influenza A H3 NOT DETECTED   Final   Comment: (NOTE)           Normal Reference Range for each Analyte: NOT DETECTED     Testing performed using the Luminex xTAG Respiratory Viral Panel  test     kit.     The analytical performance characteristics  of this assay have been     determined by [facility].  The modifications have not been     cleared or approved by the FDA. This assay has been validated pursuant     to the CLIA regulations and is used for clinical purposes.     Performed at [place], BLOOD (ROUTINE X 2)     Status: None   Collection Time    03/23/14  7:20 PM      Result Value Ref Range Status   Specimen Description BLOOD RIGHT HAND   Final   Special Requests BOTTLES DRAWN AEROBIC AND ANAEROBIC 5CC   Final   Culture  Setup Time     Final   Value: 03/23/2014 22:25     Performed at [facility]   Culture     Final   Value: NO GROWTH 5 DAYS     Performed at [facility]   Report Status 03/29/2014 FINAL   Final  ANAEROBIC CULTURE     Status: None   Collection Time    03/25/14  3:21 PM      Result Value Ref Range Status   Specimen Description WOUND   Final   Special Requests LEFT POPITEAL EMBOLIUS PT ON ZINACEF   Final   Gram Stain PENDING   Incomplete   Culture     Final   Value: NO ANAEROBES ISOLATED; CULTURE IN PROGRESS FOR 5 DAYS     Performed at [facility]   Report Status PENDING   Incomplete  WOUND CULTURE     Status: None   Collection Time    03/25/14  3:21 PM      Result Value Ref Range Status   Specimen Description WOUND   Final   Special Requests LEFT POPITEAL EMBOLIUS PT ON ZINCEF   Final   Gram Stain     Final   Value: NO WBC SEEN     NO SQUAMOUS EPITHELIAL CELLS SEEN     NO ORGANISMS SEEN     Performed at [facility]   Culture     Final   Value: NO GROWTH 2 DAYS     Performed at [facility]   Report Status 03/28/2014 FINAL   Final  MRSA PCR SCREENING     Status: None   Collection Time    03/26/14  6:31 AM      Result Value Ref Range Status   MRSA by PCR NEGATIVE  NEGATIVE Final   Comment:            The GeneXpert MRSA Assay (FDA     approved for NASAL specimens     only), is one component of a     comprehensive MRSA  colonization     surveillance program. It is not     intended to diagnose MRSA     infection nor to guide or     monitor treatment for     MRSA infections.    Studies/Results: Dg Chest 2 View  03/29/2014   CLINICAL DATA:  Shortness of breath.  EXAM: CHEST  2 VIEW  COMPARISON:  PA and lateral chest 03/25/2014 and 03/22/2014. CT chest 03/22/2014.  FINDINGS: Cardiomegaly is again seen. Mild interstitial edema seen on the most recent examination has improved. Small right pleural effusion is also decreased. No pneumothorax.  IMPRESSION: Improved interstitial edema  and small right pleural effusion.  Cardiomegaly.   Electronically Signed   By: Inge Rise M.D.   On: 03/29/2014 08:10     Assessment/Plan: MV Endocarditis  Staph bacteremia (May 2015, August 2015)  ESRD  Total days of antibiotics: 6 vanco/cefepime ---> ancef  TEE: MV IE with severe MR  Awaiting MVR No change in anbx for now Will follow peripherally         Bobby Rumpf Infectious Diseases (pager) [phone] [email or web address] 03/29/2014, 5:20 PM  LOS: 7 days

## 2014-03-29 NOTE — Progress Notes (Signed)
  Date: 03/29/2014  Patient name: FUTURE MONRROY  Medical record number: [id number]  Date of birth: [birth year]   This patient has been seen and the plan of care was discussed with the house staff. Please see their note for complete details. I concur with their findings with the following additions/corrections: Mr Fogg was asleep on my visit. I have discussed his care with team and reviewed all consultants notes.   Bartholomew Crews, MD 03/29/2014, 3:56 PM

## 2014-03-29 NOTE — Progress Notes (Signed)
  [name] Progress Note   Subjective: HD yesterday and again today, total UF 6.3 kg and wt is down now to 97.5kg.  Prior dry wt was 103 kgs.    Filed Vitals:   03/29/14 1246 03/29/14 1300 03/29/14 1310 03/29/14 1431  BP: 129/70 119/54 142/80 128/74  Pulse: 83 81 83 89  Temp:   97.5 F (36.4 C)   TempSrc:   Oral   Resp: 29 24 20 23   Height:    5\' 11"  (1.803 m)  Weight:   97.5 kg (214 lb 15.2 oz)   SpO2: 99% 99% 99% 96%   Exam: Alert, no distress Chest CTA bilat RRR 2/6 SEM Abd soft, NTND No LE edema RFA AVF +bruit Neuro is nf, Ox 3  [facility]  4 hr 103kgs  2K/2Ca  Heparin 9000  > 1500 mid   500/1.5  Hectorol 9 ug TIW,  Aranesp 25 mcg q week        Assessment: 1 Mitral valve endocarditis/regurgitation + MSSA bacteremia- for Ancef thru 10/20, heart cath neg for CAD 2 Septic embolus LLE, s/p fem-pop BPG on 9/18 3 ESRD on HD 4 HTN on 2 BP meds 5 Anemia cont aranesp at 40/wk 6 HPTH cont sensipar, renvela, vit D on hold d/t ^Ca 7 Vol overload - wt down now 6 kg below prior dry wt, better  Plan- HD done today, next HD Thursday, lower dry wt a little further    Kelly Splinter MD  pager [phone]    cell [phone]  03/29/2014, 4:37 PM     Recent Labs Lab 03/26/14 0340 03/27/14 1457 03/28/14 0340 03/29/14 0226  NA 138 138 135* 138  K 5.1 4.0 3.9 4.2  CL 97 97 95* 96  CO2 25 28 24 26   GLUCOSE 96 145* 106* 94  BUN 36* 27* 31* 22  CREATININE 9.64* 7.77* 9.12* 7.27*  CALCIUM 10.2 10.8* 10.7* 10.3  PHOS 7.5*  --  6.8* 4.7*    Recent Labs Lab 03/26/14 0340 03/28/14 0340 03/29/14 0226  ALBUMIN 2.1* 1.9* 2.1*    Recent Labs Lab 03/27/14 1457 03/28/14 0340 03/29/14 0226  WBC 13.0* 12.6* 10.2  HGB 8.8* 8.8* 9.2*  HCT 26.2* 26.1* 27.5*  MCV 90.7 90.6 90.8  PLT 229 243 255   . sodium chloride   Intravenous Once  . amLODipine  10 mg Oral QPM  .  ceFAZolin (ANCEF) IV  2 g Intravenous Q T,Th,Sat-1800  . chlorhexidine  15 mL  Mouth/Throat BID  . cinacalcet  60 mg Oral QPC supper  . darbepoetin (ARANESP) injection - DIALYSIS  40 mcg Intravenous Q Thu-HD  . heparin  5,000 Units Subcutaneous 3 times per day  . hydrALAZINE  75 mg Oral 3 times per day  . sevelamer carbonate  800 mg Oral TID WC  . sodium chloride  3 mL Intravenous Q12H  . sodium chloride  3 mL Intravenous Q12H   . sodium chloride 10 mL/hr (03/25/14 1338)   sodium chloride, acetaminophen, ondansetron (ZOFRAN) IV, sodium chloride

## 2014-03-30 ENCOUNTER — Encounter (HOSPITAL_COMMUNITY): Payer: Medicare Other

## 2014-03-30 LAB — ANAEROBIC CULTURE: Gram Stain: NONE SEEN

## 2014-03-30 LAB — CBC
HEMATOCRIT: 29.3 % — AB (ref 39.0–52.0)
Hemoglobin: 9.6 g/dL — ABNORMAL LOW (ref 13.0–17.0)
MCH: 30 pg (ref 26.0–34.0)
MCHC: 32.8 g/dL (ref 30.0–36.0)
MCV: 91.6 fL (ref 78.0–100.0)
Platelets: 298 10*3/uL (ref 150–400)
RBC: 3.2 MIL/uL — AB (ref 4.22–5.81)
RDW: 14.9 % (ref 11.5–15.5)
WBC: 10.5 10*3/uL (ref 4.0–10.5)

## 2014-03-30 LAB — BASIC METABOLIC PANEL
Anion gap: 15 (ref 5–15)
BUN: 21 mg/dL (ref 6–23)
CHLORIDE: 94 meq/L — AB (ref 96–112)
CO2: 29 meq/L (ref 19–32)
CREATININE: 5.91 mg/dL — AB (ref 0.50–1.35)
Calcium: 10.9 mg/dL — ABNORMAL HIGH (ref 8.4–10.5)
GFR calc Af Amer: 12 mL/min — ABNORMAL LOW (ref >90)
GFR calc non Af Amer: 10 mL/min — ABNORMAL LOW (ref >90)
GLUCOSE: 96 mg/dL (ref 70–99)
POTASSIUM: 4 meq/L (ref 3.7–5.3)
Sodium: 138 mEq/L (ref 137–147)

## 2014-03-30 LAB — MAGNESIUM: MAGNESIUM: 2.2 mg/dL (ref 1.5–2.5)

## 2014-03-30 MED ORDER — PENTAFLUOROPROP-TETRAFLUOROETH EX AERO: 1.0000 "application " | INHALATION_SPRAY | CUTANEOUS | Status: DC | PRN

## 2014-03-30 MED ORDER — AMLODIPINE BESYLATE 5 MG PO TABS
5.0000 mg | ORAL_TABLET | Freq: Every evening | ORAL | Status: DC
  Administered 2014-03-30 – 2014-03-31 (×2): 5 mg via ORAL
  Filled 2014-03-30 (×5): qty 1

## 2014-03-30 MED ORDER — LIDOCAINE-PRILOCAINE 2.5-2.5 % EX CREA
1.0000 | TOPICAL_CREAM | CUTANEOUS | Status: DC | PRN
Start: 2014-03-30 — End: 2014-04-01
  Filled 2014-03-30: qty 5

## 2014-03-30 MED ORDER — LIDOCAINE HCL (PF) 1 % IJ SOLN: 5.0000 mL | INTRAMUSCULAR | Status: DC | PRN

## 2014-03-30 MED ORDER — HEPARIN SODIUM (PORCINE) 1000 UNIT/ML DIALYSIS
1000.0000 [IU] | INTRAMUSCULAR | Status: DC | PRN
  Filled 2014-03-30: qty 1

## 2014-03-30 MED ORDER — NEPRO/CARBSTEADY PO LIQD
237.0000 mL | ORAL | Status: DC | PRN
  Filled 2014-03-30: qty 237

## 2014-03-30 MED ORDER — SODIUM CHLORIDE 0.9 % IV SOLN
100.0000 mL | INTRAVENOUS | Status: DC | PRN
Start: 2014-03-30 — End: 2014-04-01

## 2014-03-30 MED ORDER — ALTEPLASE 2 MG IJ SOLR
2.0000 mg | Freq: Once | INTRAMUSCULAR | Status: AC | PRN
  Filled 2014-03-30: qty 2

## 2014-03-30 MED ORDER — HEPARIN SODIUM (PORCINE) 1000 UNIT/ML DIALYSIS
5000.0000 [IU] | INTRAMUSCULAR | Status: DC | PRN
  Filled 2014-03-30: qty 5

## 2014-03-30 MED ORDER — HYDRALAZINE HCL 50 MG PO TABS
50.0000 mg | ORAL_TABLET | Freq: Three times a day (TID) | ORAL | Status: DC
  Administered 2014-03-30 – 2014-04-01 (×5): 50 mg via ORAL
  Filled 2014-03-30 (×9): qty 1

## 2014-03-30 MED ORDER — SODIUM CHLORIDE 0.9 % IV SOLN: 100.0000 mL | INTRAVENOUS | Status: DC | PRN

## 2014-03-30 NOTE — Progress Notes (Signed)
  [name] Progress Note   Subjective: no complaints, resting  Filed Vitals:   03/30/14 0012 03/30/14 0410 03/30/14 0500 03/30/14 0800  BP: 136/88 137/82    Pulse: 95 82  81  Temp: 97.8 F (36.6 C) 98.1 F (36.7 C)  97.8 F (36.6 C)  TempSrc: Oral Oral  Oral  Resp: 24 27  25   Height:      Weight:   97.3 kg (214 lb 8.1 oz)   SpO2: 100% 100%  100%   Exam: Alert, no distress Chest CTA bilat RRR 2/6 SEM Abd soft, NTND No LE edema RFA AVF +bruit Neuro is nf, Ox 3  [facility]  4 hr 103kgs  2K/2Ca  Heparin 9000  > 1500 mid   500/1.5  Hectorol 9 ug TIW,  Aranesp 25 mcg q week        Assessment: 1 Mitral valve endocarditis/regurgitation w MSSA bacteremia- for surgery soon per [facility], on Ancef thru 10/20, heart cath neg for CAD 2 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 BPG 9/18 3 ESRD on HD 4 HTN / vol excess by cath - resolved, wt down 6 kg, on norvasc and hydralazine 5 Anemia cont aranesp at 40/wk 6 HPTH cont sensipar, renvela, vit D on hold d/t ^Ca  Plan- HD tomorrow, lower dry wt a bit further if tol, decrease BP meds slightly    Kelly Splinter MD  pager [phone]    cell [phone]  03/30/2014, 11:01 AM     Recent Labs Lab 03/26/14 0340  03/28/14 0340 03/29/14 0226 03/30/14 0312  NA 138  < > 135* 138 138  K 5.1  < > 3.9 4.2 4.0  CL 97  < > 95* 96 94*  CO2 25  < > 24 26 29   GLUCOSE 96  < > 106* 94 96  BUN 36*  < > 31* 22 21  CREATININE 9.64*  < > 9.12* 7.27* 5.91*  CALCIUM 10.2  < > 10.7* 10.3 10.9*  PHOS 7.5*  --  6.8* 4.7*  --   < > = values in this interval not displayed.  Recent Labs Lab 03/26/14 0340 03/28/14 0340 03/29/14 0226  ALBUMIN 2.1* 1.9* 2.1*    Recent Labs Lab 03/28/14 0340 03/29/14 0226 03/30/14 0312  WBC 12.6* 10.2 10.5  HGB 8.8* 9.2* 9.6*  HCT 26.1* 27.5* 29.3*  MCV 90.6 90.8 91.6  PLT 243 255 298   . sodium chloride   Intravenous Once  . amLODipine  10 mg Oral QPM  .  ceFAZolin (ANCEF) IV   2 g Intravenous Q T,Th,Sat-1800  . chlorhexidine  15 mL Mouth/Throat BID  . cinacalcet  60 mg Oral QPC supper  . darbepoetin (ARANESP) injection - DIALYSIS  40 mcg Intravenous Q Thu-HD  . heparin  5,000 Units Subcutaneous 3 times per day  . hydrALAZINE  75 mg Oral 3 times per day  . sevelamer carbonate  800 mg Oral TID WC  . sodium chloride  3 mL Intravenous Q12H  . sodium chloride  3 mL Intravenous Q12H   . sodium chloride 10 mL/hr (03/25/14 1338)   sodium chloride, acetaminophen, ondansetron (ZOFRAN) IV, oxyCODONE-acetaminophen, sodium chloride

## 2014-03-30 NOTE — Progress Notes (Signed)
CARDIAC REHAB PHASE I   PRE:  Rate/Rhythm: 88 SR    BP: sitting 137/80    SaO2: 99 2L, 99 RA  MODE:  Ambulation: 110 ft   POST:  Rate/Rhythm: 96 SR    BP: sitting 139/43     SaO2: 96 RA  Pt very reluctant to walk. Sts he is worried about his knee pain. Eventually convinced him to try. Once up, pt could bear weight on left leg and used RW for support. Ambulated independently with RW 110 ft. Stated his "stiffness" got better with distance but "didn't want to push it". Encouraged pt to walk later with nursing. Pt admits that he is scared and doesn't want to know about OHS. Responded well to encouragement and TLC. SaO2 good, d/c'd O2 while pt is up and moving. He sts he needs it in the bed. Will f/u tomorrow. [id number]   Timothy Palmer CES, ACSM 03/30/2014 3:41 PM

## 2014-03-30 NOTE — Progress Notes (Signed)
  Date: 03/30/2014  Patient name: FENNER PROBUS  Medical record number: [id number]  Date of birth: October 15, 1965   This patient has been seen and the plan of care was discussed with the house staff. Please see their note for complete details. I concur with their findings with the following additions/corrections: Mr Crabbe is feeling well although cont to have hot flashed occasionally - last was last night. Stable for transfer to tele. PT consult as pt not OOB much. CVTS planning on MV repair / replacement next week.   Bartholomew Crews, MD 03/30/2014, 1:30 PM

## 2014-03-30 NOTE — Progress Notes (Signed)
      [address]       [place] [address]             [phone]     CARDIOTHORACIC SURGERY PROGRESS NOTE  2 Days Post-Op  S/P Procedure(s) (LRB): LEFT AND RIGHT HEART CATHETERIZATION WITH CORONARY ANGIOGRAM (N/A)  Subjective: Feels better.  Ambulated in unit today.  SOB improved.  Objective: Vital signs in last 24 hours: Temp:  [97.8 F (36.6 C)-98.5 F (36.9 C)] 98.5 F (36.9 C) (09/23 1500) Pulse Rate:  [81-95] 81 (09/23 0800) Cardiac Rhythm:  [-] Normal sinus rhythm (09/22 2000) Resp:  [24-27] 25 (09/23 0800) BP: (136-137)/(82-88) 137/82 mmHg (09/23 0410) SpO2:  [100 %] 100 % (09/23 0800) Weight:  [97.3 kg (214 lb 8.1 oz)] 97.3 kg (214 lb 8.1 oz) (09/23 0500)  Physical Exam:  Rhythm:   sinus  Breath sounds: clear  Heart sounds:  RRR w/ systolic murmur  Abdomen:  soft  Extremities:  warm   Intake/Output from previous day: 09/22 0701 - 09/23 0700 In: 620 [P.O.:620] Out: 3250 [Urine:50] Intake/Output this shift: Total I/O In: 280 [P.O.:280] Out: -   Lab Results:  Recent Labs  03/29/14 0226 03/30/14 0312  WBC 10.2 10.5  HGB 9.2* 9.6*  HCT 27.5* 29.3*  PLT 255 298   BMET:  Recent Labs  03/29/14 0226 03/30/14 0312  NA 138 138  K 4.2 4.0  CL 96 94*  CO2 26 29  GLUCOSE 94 96  BUN 22 21  CREATININE 7.27* 5.91*  CALCIUM 10.3 10.9*    CBG (last 3)  No results found for this basename: GLUCAP,  in the last 72 hours PT/INR:   Recent Labs  03/27/14 1905  LABPROT 15.1  INR 1.19    CXR:  CHEST 2 VIEW  COMPARISON: PA and lateral chest 03/25/2014 and 03/22/2014. CT  chest 03/22/2014.  FINDINGS:  Cardiomegaly is again seen. Mild interstitial edema seen on the most  recent examination has improved. Small right pleural effusion is  also decreased. No pneumothorax.  IMPRESSION:  Improved interstitial edema and small right pleural effusion.  Cardiomegaly.  Electronically Signed  By: Inge Rise M.D.  On: 03/29/2014  08:10   Assessment/Plan: S/P Procedure(s) (LRB): LEFT AND RIGHT HEART CATHETERIZATION WITH CORONARY ANGIOGRAM (N/A)  Clinically improving.  We tentatively plan for mitral valve repair/replacement on Tuesday 9/29   Montee Tallman H 03/30/2014 6:43 PM

## 2014-03-30 NOTE — Progress Notes (Signed)
  I have seen and examined the patient, and reviewed the daily progress note by Dahlia Bailiff, MS IV and discussed the care of the patient with him. Please see my progress note from 03/30/2014 for further details regarding assessment and plan.    Signed:  Otho Bellows, MD 03/30/2014, 5:33 PM

## 2014-03-30 NOTE — Progress Notes (Signed)
EKG CRITICAL VALUE     12 lead EKG performed.  Critical value noted. Roderic Ovens, RN notified.   Khyra Viscuso L, CCT 03/30/2014 7:39 AM

## 2014-03-30 NOTE — Progress Notes (Signed)
I have seen the patient and reviewed the daily progress note by Dahlia Bailiff MS 4 and discussed the care of the patient with them.  Please see note for my findings, assessment, and plans/additions.   Jessee Avers, MD

## 2014-03-30 NOTE — Evaluation (Signed)
Physical Therapy Evaluation Patient Details Name: Timothy Palmer MRN: [id number] DOB: 22-May-1966 Today's Date: 03/30/2014   History of Present Illness  48 y.o. male is s/p: Left popliteal embolectomy and intraoperative arteriogram & Above-knee to below-knee popliteal bypass with reversed great saphenous vein. 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 Additionally, CVTS planning on MV repair / replacement next week.  Clinical Impression  Patient demonstrates deficits in functional mobility as indicated below. Will need continued skilled PT to address deficits and maximize function. Patient also to have cardiac surgery next week, will defer disposition recommendations until Post op. Will see as indicated and progress as tolerated.    Follow Up Recommendations  (TBD post Cardiac Surgery, most likely rehab)    Equipment Recommendations   (TBD)    Recommendations for Other Services       Precautions / Restrictions Precautions Precautions: Fall Restrictions Weight Bearing Restrictions: No      Mobility  Bed Mobility Overal bed mobility: Modified Independent             General bed mobility comments: increased time to perform, no physical assist needed  Transfers Overall transfer level: Needs assistance Equipment used: Rolling walker (2 wheeled) Transfers: Sit to/from Stand Sit to Stand: Min guard         General transfer comment: VCs for hand placement when elevating to standing, min guard for stability  Ambulation/Gait Ambulation/Gait assistance: Min guard (one episode on mod assist for LOB) Ambulation Distance (Feet): 30 Feet Assistive device: Rolling walker (2 wheeled) Gait Pattern/deviations: Step-to pattern Gait velocity: decreased   General Gait Details: patient self limiting WBing on LLE  Stairs            Wheelchair Mobility    Modified Rankin (Stroke Patients Only)       Balance  Overall balance assessment: Needs assistance         Standing balance support: During functional activity Standing balance-Leahy Scale: Poor Standing balance comment: one noted LOB requiring increased assist to stabilize                             Pertinent Vitals/Pain Pain Assessment: 0-10 Pain Score: 4  Pain Location: LLE Pain Descriptors / Indicators: Constant;Aching Pain Intervention(s): Limited activity within patient's tolerance;Monitored during session;Repositioned    Home Living Family/patient expects to be discharged to:: Private residence Living Arrangements: Alone Available Help at Discharge: Family;Friend(s)   Home Access: Stairs to enter   Entrance Stairs-Number of Steps: 5 Home Layout: One level        Prior Function Level of Independence: Independent               Hand Dominance   Dominant Hand: Right    Extremity/Trunk Assessment   Upper Extremity Assessment: Overall WFL for tasks assessed           Lower Extremity Assessment: LLE deficits/detail   LLE Deficits / Details: increased edema noted     Communication   Communication: No difficulties  Cognition Arousal/Alertness: Awake/alert Behavior During Therapy: WFL for tasks assessed/performed Overall Cognitive Status: Within Functional Limits for tasks assessed                      General Comments      Exercises        Assessment/Plan    PT Assessment Patient needs continued PT services  PT Diagnosis Difficulty walking;Abnormality of gait;Acute  pain   PT Problem List Decreased strength;Decreased range of motion;Decreased activity tolerance;Decreased balance;Decreased mobility  PT Treatment Interventions DME instruction;Gait training;Stair training;Functional mobility training;Therapeutic activities;Therapeutic exercise;Balance training;Patient/family education   PT Goals (Current goals can be found in the Care Plan section) Acute Rehab PT Goals Patient  Stated Goal: none stated PT Goal Formulation: With patient Time For Goal Achievement: 04/13/14 Potential to Achieve Goals: Good    Frequency Min 3X/week   Barriers to discharge        Co-evaluation               End of Session Equipment Utilized During Treatment: Gait belt;Oxygen Activity Tolerance: Patient tolerated treatment well;Patient limited by pain Patient left: in chair;with call bell/phone within reach Nurse Communication: Mobility status         Time: 1333-1350 PT Time Calculation (min): 17 min   Charges:   PT Evaluation $Initial PT Evaluation Tier I: 1 Procedure PT Treatments $Therapeutic Activity: 8-22 mins   PT G CodesDuncan Dull 03/30/2014, 3:34 PM Alben Deeds, [place] DPT  [phone]

## 2014-03-30 NOTE — Progress Notes (Addendum)
  Progress Note    03/30/2014 7:28 AM 2 Days Post-Op  Subjective:  Leg is feeling better.  States he has been working it.  No abdominal complaints.  Afebrile HR 70's-80's regular [id number] systolic [id number] RA  ABx:  Ancef  Filed Vitals:   03/30/14 0410  BP: 137/82  Pulse: 82  Temp: 98.1 F (36.7 C)  Resp: 27    Physical Exam: Cardiac:  regular Lungs:  Non labored Incisions:  All incisions are healing nicely Extremities:  3+ left DP pulse Abdomen:  Soft, ND/NT  CBC    Component Value Date/Time   WBC 10.5 03/30/2014 0312   RBC 3.20* 03/30/2014 0312   HGB 9.6* 03/30/2014 0312   HCT 29.3* 03/30/2014 0312   PLT 298 03/30/2014 0312   MCV 91.6 03/30/2014 0312   MCH 30.0 03/30/2014 0312   MCHC 32.8 03/30/2014 0312   RDW 14.9 03/30/2014 0312   LYMPHSABS 2.4 09/12/2012 1445   MONOABS 1.1* 09/12/2012 1445   EOSABS 0.1 09/12/2012 1445   BASOSABS 0.0 09/12/2012 1445    BMET    Component Value Date/Time   NA 138 03/30/2014 0312   K 4.0 03/30/2014 0312   CL 94* 03/30/2014 0312   CO2 29 03/30/2014 0312   GLUCOSE 96 03/30/2014 0312   BUN 21 03/30/2014 0312   CREATININE 5.91* 03/30/2014 0312   CALCIUM 10.9* 03/30/2014 0312   GFRNONAA 10* 03/30/2014 0312   GFRAA 12* 03/30/2014 0312    INR    Component Value Date/Time   INR 1.19 03/27/2014 1905     Intake/Output Summary (Last 24 hours) at 03/30/14 0728 Last data filed at 03/30/14 0500  Gross per 24 hour  Intake    620 ml  Output   3250 ml  Net  -2630 ml     Assessment:  48 y.o. male is s/p:  Left popliteal embolectomy and intraoperative arteriogram  &  Above-knee to below-knee popliteal bypass with reversed great saphenous vein  4 Days Post-Op  Plan: -pt doing well from vascular surgery standpoint.  He has a 3+ left DP pulse. -awaiting plan from dental/cardiac surgery  -DVT prophylaxis:  SQ heparin   Leontine Locket, PA-C [facility] [phone] 03/30/2014 7:28 AM    I have examined the patient,  reviewed and agree with above. Wounds are healing nicely. For a viable watts for 2-3+ dorsalis pedis pulse. Continue to work with physical therapy. I for cardiac surgery next week. Will not follow actively. Please call if we can provide assistance  Yentl Verge, MD 03/30/2014 2:43 PM

## 2014-03-30 NOTE — Progress Notes (Signed)
Patient ID: CATO WHITAKER, male   DOB: 02/10/1966, 48 y.o.   MRN: [id number]    SUBJECTIVE:  HD yesterday, weight continues to fall.  Breathing improved.   Scheduled Meds: . sodium chloride   Intravenous Once  . amLODipine  10 mg Oral QPM  .  ceFAZolin (ANCEF) IV  2 g Intravenous Q T,Th,Sat-1800  . chlorhexidine  15 mL Mouth/Throat BID  . cinacalcet  60 mg Oral QPC supper  . darbepoetin (ARANESP) injection - DIALYSIS  40 mcg Intravenous Q Thu-HD  . heparin  5,000 Units Subcutaneous 3 times per day  . hydrALAZINE  75 mg Oral 3 times per day  . sevelamer carbonate  800 mg Oral TID WC  . sodium chloride  3 mL Intravenous Q12H  . sodium chloride  3 mL Intravenous Q12H   Continuous Infusions: . sodium chloride 10 mL/hr (03/25/14 1338)   PRN Meds:.sodium chloride, acetaminophen, ondansetron (ZOFRAN) IV, oxyCODONE-acetaminophen, sodium chloride    Filed Vitals:   03/30/14 0012 03/30/14 0410 03/30/14 0500 03/30/14 0800  BP: 136/88 137/82    Pulse: 95 82    Temp: 97.8 F (36.6 C) 98.1 F (36.7 C)  97.8 F (36.6 C)  TempSrc: Oral Oral  Oral  Resp: 24 27    Height:      Weight:   214 lb 8.1 oz (97.3 kg)   SpO2: 100% 100%      Intake/Output Summary (Last 24 hours) at 03/30/14 0854 Last data filed at 03/30/14 0500  Gross per 24 hour  Intake    260 ml  Output   3250 ml  Net  -2990 ml    LABS: Basic Metabolic Panel:  Recent Labs  03/28/14 0340 03/29/14 0226 03/30/14 0312  NA 135* 138 138  K 3.9 4.2 4.0  CL 95* 96 94*  CO2 24 26 29   GLUCOSE 106* 94 96  BUN 31* 22 21  CREATININE 9.12* 7.27* 5.91*  CALCIUM 10.7* 10.3 10.9*  PHOS 6.8* 4.7*  --    Liver Function Tests:  Recent Labs  03/28/14 0340 03/29/14 0226  ALBUMIN 1.9* 2.1*   No results found for this basename: LIPASE, AMYLASE,  in the last 72 hours CBC:  Recent Labs  03/29/14 0226 03/30/14 0312  WBC 10.2 10.5  HGB 9.2* 9.6*  HCT 27.5* 29.3*  MCV 90.8 91.6  PLT 255 298   Cardiac Enzymes: No  results found for this basename: CKTOTAL, CKMB, CKMBINDEX, TROPONINI,  in the last 72 hours BNP: No components found with this basename: POCBNP,  D-Dimer: No results found for this basename: DDIMER,  in the last 72 hours Hemoglobin A1C: No results found for this basename: HGBA1C,  in the last 72 hours Fasting Lipid Panel: No results found for this basename: CHOL, HDL, LDLCALC, TRIG, CHOLHDL, LDLDIRECT,  in the last 72 hours Thyroid Function Tests: No results found for this basename: TSH, T4TOTAL, FREET3, T3FREE, THYROIDAB,  in the last 72 hours Anemia Panel: No results found for this basename: VITAMINB12, FOLATE, FERRITIN, TIBC, IRON, RETICCTPCT,  in the last 72 hours  RADIOLOGY: Dg Chest 2 View  03/23/2014   CLINICAL DATA:  Weakness, leg pain  EXAM: CHEST  2 VIEW  COMPARISON:  03/22/2014  FINDINGS: Borderline cardiomegaly. Small right pleural effusion with right basilar atelectasis. Central mild vascular congestion and mild perihilar interstitial prominence suspicious for mild interstitial edema. Bony thorax is unremarkable.  IMPRESSION: Small right pleural effusion with right basilar atelectasis. Central mild vascular congestion and mild perihilar  interstitial prominence suspicious for mild interstitial edema. No segmental infiltrate.   Electronically Signed   By: Lahoma Crocker M.D.   On: 03/23/2014 09:04   Dg Chest 2 View  03/22/2014   CLINICAL DATA:  Shortness of breath  EXAM: CHEST  2 VIEW  COMPARISON:  07/23/2013  FINDINGS: There is no focal parenchymal opacity, pleural effusion, or pneumothorax. Mild pulmonary vascular congestion. There is mild cardiomegaly.  The osseous structures are unremarkable.  IMPRESSION: Mild pulmonary vascular congestion. Otherwise no active cardiopulmonary disease.   Electronically Signed   By: Kathreen Devoid   On: 03/22/2014 12:14   Ct Angio Chest W/cm &/or Wo Cm  03/22/2014   CLINICAL DATA:  Short of breath.  Cough.  EXAM: CT ANGIOGRAPHY CHEST WITH CONTRAST   TECHNIQUE: Multidetector CT imaging of the chest was performed using the standard protocol during bolus administration of intravenous contrast. Multiplanar CT image reconstructions and MIPs were obtained to evaluate the vascular anatomy.  CONTRAST:  47mL OMNIPAQUE IOHEXOL 350 MG/ML SOLN  COMPARISON:  09/12/2012  FINDINGS: No evidence of a pulmonary embolus.  Heart is mildly enlarged. There are minor coronary artery calcifications. Great vessels are normal in caliber.  There is mediastinal adenopathy. Multiple reference measurements were made. There is a 14 mm short axis node in the right superior mediastinum. There is a 19 mm short axis prevascular lymph node adjacent the left pulmonary artery. There is a 15 mm right subcarinal lymph node. There are several other prominent to mildly enlarged lymph nodes. Although there were prominent nodes previously, the larger nodes have increased in size. There are prominent hilar lymph nodes not well defined, and without change.  Small right pleural effusion.  No left effusion.  Stable calcified pleural plaque over the right hemidiaphragm.  Lungs show mild diffuse interstitial thickening and hazy and somewhat heterogeneous airspace opacities which have a central to upper lobe predominance. Interstitial thickening is greatest in the lung bases.  Limited evaluation of the upper abdomen shows gallstones but is otherwise unremarkable.  No osteoblastic or osteolytic lesions.  Review of the MIP images confirms the above findings.  IMPRESSION: 1. No evidence of a pulmonary embolism. 2. Mild cardiomegaly, interstitial thickening and hazy areas of airspace lung opacity, as well as a small right effusion, all suggests congestive heart failure. However, there also prominent and mildly enlarged mediastinal lymph nodes. These are likely reactive. The possibility of lung infection should be considered in the proper clinical setting.   Electronically Signed   By: Lajean Manes M.D.   On:  03/22/2014 13:09   Dg Knee Ap/lat W/sunrise Left  03/23/2014   CLINICAL DATA:  Left knee pain and swelling for 3 days  EXAM: DG KNEE - 3 VIEWS  COMPARISON:  None.  FINDINGS: There is no evidence of fracture, dislocation, or joint effusion. There is no evidence of arthropathy or other focal bone abnormality. Soft tissues are unremarkable. Transverse patellar cleft incidentally noted. Vascular calcifications are noted.  IMPRESSION: Negative.   Electronically Signed   By: Conchita Paris M.D.   On: 03/23/2014 21:17    PHYSICAL EXAM General: NAD Neck: Thick, JVP 8 cm, no thyromegaly or thyroid nodule.  Lungs: Clear to auscultation bilaterally with normal respiratory effort. CV: Nondisplaced PMI.  Heart regular S1/S2, 3/6 HSM apex.  No peripheral edema.  No carotid bruit.  Normal pedal pulses.  Abdomen: Soft, nontender, no hepatosplenomegaly, no distention.  Neurologic: Alert and oriented x 3.  Psych: Normal affect. Extremities: No clubbing or  cyanosis.   TELEMETRY: Reviewed telemetry pt in NSR  ASSESSMENT AND PLAN: 48 yo with ESRD on HD via left arm fistula and HTN presented with fever and dyspnea with evidence for CHF.  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  1. 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  Blood culture negative so far but had MSSA in 5/15 and 8/15.  He is getting Ancef IV now.  MRI head - No acute findings.  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  On Friday, he had left popliteal embolectomy and above to below knee popliteal bypass.  2. Mitral regurgitation: Severe by TEE.  Small vegetation noted on the posterior leaflet.  Possible perforation.  Seen by Dr Roxy Manns, given severe MR with CHF. Plan for MV repair/replacement next week.  - Pre-op LHC/RHC with no coronary disease but elevated right and left heart filling pressure with prominent V-waves in PCWP tracing.   CO was preserved.  3. CHF: Acute diastolic CHF in the setting of severe MR.  Fluid removal via HD.  Will work on afterload control by titrating up meds.  No spiro or ace due to ESRD. Continue hydralazine 75 mg tid and amlodipine.  4.  ESRD: per Nephrology, HD again Thursday.  Loralie Champagne 03/30/2014 8:54 AM

## 2014-03-30 NOTE — Progress Notes (Addendum)
Subjective: Pt tolerated regular dialysis session yesterday. Knee pain overnight, improved but still present. Remains afebrile but still with intermittent diaphoresis. Denies chest pain. No SOB at rest but some with exertion.   Objective: Vital signs in last 24 hours: Filed Vitals:   03/30/14 0012 03/30/14 0410 03/30/14 0500 03/30/14 0800  BP: 136/88 137/82    Pulse: 95 82  81  Temp: 97.8 F (36.6 C) 98.1 F (36.7 C)  97.8 F (36.6 C)  TempSrc: Oral Oral  Oral  Resp: _0 Height:      Weight:   214 lb 8.1 oz (97.3 kg)   SpO2: 100% 100%  100%   Weight change: -6 lb 2.8 oz (-2.8 kg)  Intake/Output Summary (Last 24 hours) at 03/30/14 1123 Last data filed at 03/30/14 0500  Gross per 24 hour  Intake    260 ml  Output   3250 ml  Net  -2990 ml   Vitals reviewed. General: Lying up in bed  HEENT: EOMI Cardiac: RRR, 3/6 holosystolic murmur at the apex Pulm: Clear to auscultation bilaterally, no wheezes, rales, or rhonchi Abd: Soft, nontender, nondistended, BS present Ext: Warm and well perfused. 2+ DP pulse in bilateral feet   Neuro: Alert and oriented X3, moves all 4 extremities, nonfocal   Lab Results: Basic Metabolic Panel:  Recent Labs Lab 03/28/14 0340 03/29/14 0226 03/30/14 0312  NA 135* 138 138  K 3.9 4.2 4.0  CL 95* 96 94*  CO2 _1 GLUCOSE 106* 94 96  BUN 31* 22 21  CREATININE 9.12* 7.27* 5.91*  CALCIUM 10.7* 10.3 10.9*  PHOS 6.8* 4.7*  --    CBC:  Recent Labs Lab 03/29/14 0226 03/30/14 0312  WBC 10.2 10.5  HGB 9.2* 9.6*  HCT 27.5* 29.3*  MCV 90.8 91.6  PLT 255 298   Cardiac Enzymes: No results found for this basename: CKTOTAL, CKMB, CKMBINDEX, TROPONINI,  in the last 168 hours BNP: No results found for this basename: PROBNP,  in the last 168 hours  Urinalysis: No results found for this basename: COLORURINE, APPERANCEUR, LABSPEC, PHURINE, GLUCOSEU, HGBUR, BILIRUBINUR, KETONESUR, PROTEINUR, UROBILINOGEN, NITRITE, LEUKOCYTESUR,  in  the last 168 hours    Micro Results: Recent Results (from the past 240 hour(s))  CULTURE, BLOOD (ROUTINE X 2)     Status: None   Collection Time    03/22/14  1:52 PM      Result Value Ref Range Status   Specimen Description BLOOD ARM RIGHT   Final   Special Requests BOTTLES DRAWN AEROBIC ONLY 10CC   Final   Culture  Setup Time     Final   Value: 03/22/2014 18:55     Performed at [facility]   Culture     Final   Value: NO GROWTH 5 DAYS     Performed at [facility]   Report Status 03/28/2014 FINAL   Final  CULTURE, BLOOD (ROUTINE X 2)     Status: None   Collection Time    03/22/14  2:00 PM      Result Value Ref Range Status   Specimen Description BLOOD HAND RIGHT   Final   Special Requests BOTTLES DRAWN AEROBIC AND ANAEROBIC 10CC   Final   Culture  Setup Time     Final   Value: 03/22/2014 18:56     Performed at [place]     Final   Value: NO GROWTH 5 DAYS  Performed at [facility]   Report Status 03/28/2014 FINAL   Final  RESPIRATORY VIRUS PANEL     Status: None   Collection Time    03/22/14  9:22 PM      Result Value Ref Range Status   Source - RVPAN NASOPHARYNGEAL   Final   Respiratory Syncytial Virus A NOT DETECTED   Final   Respiratory Syncytial Virus B NOT DETECTED   Final   Influenza A NOT DETECTED   Final   Influenza B NOT DETECTED   Final   Parainfluenza 1 NOT DETECTED   Final   Parainfluenza 2 NOT DETECTED   Final   Parainfluenza 3 NOT DETECTED   Final   Metapneumovirus NOT DETECTED   Final   Rhinovirus NOT DETECTED   Final   Adenovirus NOT DETECTED   Final   Influenza A H1 NOT DETECTED   Final   Influenza A H3 NOT DETECTED   Final   Comment: (NOTE)           Normal Reference Range for each Analyte: NOT DETECTED     Testing performed using the Luminex xTAG Respiratory Viral Panel test     kit.     The analytical performance characteristics of this assay have been     determined by [facility].   The modifications have not been     cleared or approved by the FDA. This assay has been validated pursuant     to the CLIA regulations and is used for clinical purposes.     Performed at [place], BLOOD (ROUTINE X 2)     Status: None   Collection Time    03/23/14  7:20 PM      Result Value Ref Range Status   Specimen Description BLOOD RIGHT HAND   Final   Special Requests BOTTLES DRAWN AEROBIC AND ANAEROBIC 5CC   Final   Culture  Setup Time     Final   Value: 03/23/2014 22:25     Performed at [facility]   Culture     Final   Value: NO GROWTH 5 DAYS     Performed at [facility]   Report Status 03/29/2014 FINAL   Final  ANAEROBIC CULTURE     Status: None   Collection Time    03/25/14  3:21 PM      Result Value Ref Range Status   Specimen Description WOUND   Final   Special Requests LEFT POPITEAL EMBOLIUS PT ON ZINACEF   Final   Gram Stain     Final   Value: NO WBC SEEN     NO SQUAMOUS EPITHELIAL CELLS SEEN     NO ORGANISMS SEEN     Performed at [facility]   Culture     Final   Value: NO ANAEROBES ISOLATED     Performed at [facility]   Report Status 03/30/2014 FINAL   Final  WOUND CULTURE     Status: None   Collection Time    03/25/14  3:21 PM      Result Value Ref Range Status   Specimen Description WOUND   Final   Special Requests LEFT POPITEAL EMBOLIUS PT ON ZINCEF   Final   Gram Stain     Final   Value: NO WBC SEEN     NO SQUAMOUS EPITHELIAL CELLS SEEN     NO ORGANISMS SEEN     Performed at [facility]   Culture     Final   Value: NO GROWTH 2 DAYS     Performed at [facility]   Report Status 03/28/2014 FINAL   Final  MRSA PCR SCREENING     Status: None   Collection Time    03/26/14  6:31 AM      Result Value Ref Range Status   MRSA by PCR NEGATIVE  NEGATIVE Final   Comment:            The GeneXpert MRSA Assay (FDA     approved for NASAL specimens     only), is one component of a      comprehensive MRSA colonization     surveillance program. It is not     intended to diagnose MRSA     infection nor to guide or     monitor treatment for     MRSA infections.   Studies/Results: Dg Chest 2 View  03/29/2014   CLINICAL DATA:  Shortness of breath.  EXAM: CHEST  2 VIEW  COMPARISON:  PA and lateral chest 03/25/2014 and 03/22/2014. CT chest 03/22/2014.  FINDINGS: Cardiomegaly is again seen. Mild interstitial edema seen on the most recent examination has improved. Small right pleural effusion is also decreased. No pneumothorax.  IMPRESSION: Improved interstitial edema and small right pleural effusion.  Cardiomegaly.   Electronically Signed   By: Inge Rise M.D.   On: 03/29/2014 08:10   Medications: I have reviewed the patient's current medications. Scheduled Meds: . sodium chloride   Intravenous Once  . amLODipine  5 mg Oral QPM  .  ceFAZolin (ANCEF) IV  2 g Intravenous Q T,Th,Sat-1800  . chlorhexidine  15 mL Mouth/Throat BID  . cinacalcet  60 mg Oral QPC supper  . darbepoetin (ARANESP) injection - DIALYSIS  40 mcg Intravenous Q Thu-HD  . heparin  5,000 Units Subcutaneous 3 times per day  . hydrALAZINE  50 mg Oral 3 times per day  . sevelamer carbonate  800 mg Oral TID WC  . sodium chloride  3 mL Intravenous Q12H  . sodium chloride  3 mL Intravenous Q12H   Continuous Infusions: . sodium chloride 10 mL/hr (03/25/14 1338)   PRN Meds:.sodium chloride, acetaminophen, ondansetron (ZOFRAN) IV, oxyCODONE-acetaminophen, sodium chloride  Assessment/Plan: 48yo M w/ PMH HTN and ESRD on HD presents to the ED with increasing SOB in the setting of a productive cough and fever and was found to have endocarditis.    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 TEE with similar findings and severe mitral regurgitation. Per Nephrology, +MSSA blood culture 5/15 and 02/2014, although negative  01/2014; treated with cefazolin 2g with HD starting 8/20 to continue until 10/20. Initially started on Vanc/Zosyn which was transitioned to Ancef given recent cultures. 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 Also with 4cm probable septic abscess in upper pole of the spleen. Pt is post-op day #5 s/p left fem-pop bypass and since the surgery continues to do well. Left and right heart cath 9/21 with no CAD, severe MR and pulmonary HTN. Valve replacement likely next week. Continuing Ancef for 6 weeks from mitral valve replacement, per ID. Pt wishes to defer dental treatment at this time.  - ID consult, appreciate recommendations - Cardiology  consult, appreciate recommendations  - CT Surgery consult, appreciate recommendations - VVS consult, appreciate recommendations - Continue Ancef per ID recommendations    - Blood cultures, NGTD  Acute heart failure: CXR and CTA concerning for CHF. TTE and TTE with mitral valve vegetation and severe regurgitation, also concern for perforation. Normal EF. Right and left cardiac cath as noted above. CT Surgery to perform MVR. Pt undergoing HD TThSa, with fluid removal given increased volume status in preparation for MVR.    End stage renal disease on HD: HD TThSa. Last dialysis 9/19, 1L removed. Pt 6lbs below dry weight, renal lowering dry weight. Extra session HD 9/21 to further remove excess fluid.  - F/u with Nephrology, appreciate recommendations.  - HD tomorrow  Hypertension: Pt on ACEi at home which was continued on admission but held by Nephrology in the setting of increased fluid removal. Amlodipine and oral Hydralazine started this admission. BP well controlled this morning.  - Amlodipine 85m daily - Hydralazine increased to [id number]  T wave inversion in EKG: Improved. New diffuse T-wave inversions on EKG on  admission. Troponin negative x3. Cardiology felt changes due to electolyte abnormalities. K 3.6 and Mg 1.8. Replaced K and stable since.  Recent Labs Lab 03/26/14 0340 03/27/14 1457 03/28/14 0340 03/29/14 0226 03/30/14 0312  K 5.1 4.0 3.9 4.2 4.0    QT prolongation: QTc >600 on admission when previously normal. Improved on repeat, but still prolonged. Avoiding QTc prolonging drugs. Checking Mg level.     Dispo: Disposition is deferred at this time, awaiting improvement of current medical problems.  Transferring out of SDU to telemetry.   The patient does have a current PCP (KeLouis MeckelMD) and [facility].  The patient does not have transportation limitations that hinder transportation to clinic appointments.  .Services Needed at time of discharge: Y = Yes, Blank = No PT:   OT:   RN:   Equipment:   Other:     LOS: 8 days   KaOtho BellowsMD 03/30/2014, 11:23 AM

## 2014-03-30 NOTE — Progress Notes (Signed)
LOS: 8 days   Subjective: - NAEON - prn Percocet x1 yesterday for left lower leg pain  - 3.2L off yesterday with dialysis, wt 97.3 down from previous dry wt of 103; pt states he is trying to restrict fluids and is only having small fluid with each meal - O2 sat good on RA, however pt complaining of cough when off O2  Objective: BP 137/82  Pulse 82  Temp(Src) 97.8 F (36.6 C) (Oral)  Resp 27  Ht 5\' 11"  (1.803 m)  Wt 97.3 kg (214 lb 8.1 oz)  BMI 29.93 kg/m2  SpO2 100%  Intake/Output Summary (Last 24 hours) at 03/30/14 0836 Last data filed at 03/30/14 0500  Gross per 24 hour  Intake    260 ml  Output   3250 ml  Net  -2990 ml    Physical Exam: GEN: no notable diaphoresis, awake and oriented, no acute distress EYES: EOMI CV: II/VI holosystolic murmur greatest at mid-clavicular line, tachycardic, S1/S2 no gallop PULM: CTAB, normal work of breathing  ABD: soft, NT/ND, +BS EXT: minimal trace edema, 2+ pulses bilaterally LE, much improved L sided lower extremity pulse s/p pop bypass; incisions clean dry and intact  Labs/Studies: I have reviewed labs and studies from last 24hrs per EMR.  Medications: I have reviewed the patient's current medications.  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   MSSA MV Infective Endocarditis: Per nephrology notes, had cultures positive for MSSA recently and had been treated with cefazolin qHD. Being followed by cards, ID, CT surgery, vascular surgery; appreciate all recommendations.  Diagnostic Timeline *9/16 ECHO: small mobile vegetation on posterior leaflet *9/17 TEE: severe MR *9/18 MRI head negative for embolic events.  *9/18 MRI L knee: muscle injury posteriorly with edema in the biceps femoris, medial head gastrocnemius, and lateral head gastrocnemius; per ortho not a surgical issue.  *9/21 L&RHC: Increased L and R sided filling pressure, severe MR, no coronary disease; prominent V-waves in PCWP tracing *9/22 completed dental c/s, had orthopanthogram, deferred any treatment prior to surgery but stated understanding of need for pre-dental procedure abx. - per ID, ancef 2g qHD (9/17-) s/p vanc/cefepime (9/15-9/17); will need 6 weeks of abx coverage starting on date of anticipated surgery  - per cardiac surgery; plan to removed as much fluid as possible with HD this week to reduce filling pressures prior to tentative MV repair vs replacement next week - PT to increase mobility and condition pt in preparation for surgery next week  L popliteal mycotic embol s/p embolectomy/bypass: L popliteal embolic found on 0000000 Ao and Bifem CTA. Went to OR with Dr. Donnetta Hutching (vasc surg) on 9/18 for embolectomy followed by above to below the knee reversed saphenous vein bypass. 2-3+ BLE pulses post-operatively. CTA also  revealed SMA mycotic aneurysm and probable 4.1 cm septic abscess vs hemangioma. No abdominal complaints or positive exam findings. - CTM  Prolonged QTc: QTc 553 on 9/23. Pt not on any QT-prolonging drugs. - Check Mg; keep >2 - Avoid QT prolonging drugs  Anemia: Normocytic. Likely acute blood loss anemia on top of anemia of chronic kidney disease. Hb down to 7.0 post-operatively from  8.2 with a baseline around 11. Appropriate bump to 9.0 s/p 2upRBC in HD. 9/20 Iron studies revealed low serum iron with low TIBC and very high ferritin; c/w anemia of chronic renal disease.  - continue aranesp 40 mcg IV every thusday dialysis  Hypertension: Goaling afterload reduction in anticipation of MV surgery. - per cards- hydral 75 TID, amlodipine 10; lisinopril 20 d/c 9/21  ERSD: On TTS dialysis. Reports dry wt 103 kg.  - Nephrology c/s; appreciate recs - ESA qTh; Renvela, Sensipar  Diet: Per RD, Provide Nepro Shake po BID  Dispo: Floor status  This is a Careers information officer Note.  The care of the patient was discussed with Dr. Eulas Post and the assessment and plan formulated with their assistance.  Please see their attached note for official documentation of the daily encounter.

## 2014-03-31 ENCOUNTER — Encounter (HOSPITAL_COMMUNITY): Payer: Medicare Other

## 2014-03-31 LAB — CBC
HEMATOCRIT: 27.8 % — AB (ref 39.0–52.0)
HEMOGLOBIN: 9.3 g/dL — AB (ref 13.0–17.0)
MCH: 30.3 pg (ref 26.0–34.0)
MCHC: 33.5 g/dL (ref 30.0–36.0)
MCV: 90.6 fL (ref 78.0–100.0)
Platelets: 317 10*3/uL (ref 150–400)
RBC: 3.07 MIL/uL — AB (ref 4.22–5.81)
RDW: 15 % (ref 11.5–15.5)
WBC: 10.5 10*3/uL (ref 4.0–10.5)

## 2014-03-31 LAB — RENAL FUNCTION PANEL
ANION GAP: 15 (ref 5–15)
Albumin: 2.4 g/dL — ABNORMAL LOW (ref 3.5–5.2)
BUN: 34 mg/dL — AB (ref 6–23)
CO2: 26 meq/L (ref 19–32)
Calcium: 11 mg/dL — ABNORMAL HIGH (ref 8.4–10.5)
Chloride: 95 mEq/L — ABNORMAL LOW (ref 96–112)
Creatinine, Ser: 9.03 mg/dL — ABNORMAL HIGH (ref 0.50–1.35)
GFR calc non Af Amer: 6 mL/min — ABNORMAL LOW (ref >90)
GFR, EST AFRICAN AMERICAN: 7 mL/min — AB (ref >90)
GLUCOSE: 101 mg/dL — AB (ref 70–99)
POTASSIUM: 3.9 meq/L (ref 3.7–5.3)
Phosphorus: 6.4 mg/dL — ABNORMAL HIGH (ref 2.3–4.6)
SODIUM: 136 meq/L — AB (ref 137–147)

## 2014-03-31 MED ORDER — DARBEPOETIN ALFA-POLYSORBATE 40 MCG/0.4ML IJ SOLN
INTRAMUSCULAR | Status: AC
  Administered 2014-03-31: 40 ug via INTRAVENOUS
  Filled 2014-03-31: qty 0.4

## 2014-03-31 NOTE — Progress Notes (Signed)
  I have seen and examined the patient, and reviewed the daily progress note by Dahlia Bailiff, MS IV and discussed the care of the patient with him. Please see my progress note from 03/31/2014 for further details regarding assessment and plan.    Signed:  Otho Bellows, MD 03/31/2014, 3:21 PM

## 2014-03-31 NOTE — Progress Notes (Addendum)
INFECTIOUS DISEASE PROGRESS NOTE  ID: Timothy Palmer is a 48 y.o. 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 03/25/14 1249 03/26/14 0559   03/24/14 1800  ceFEPIme (MAXIPIME) 2 g in dextrose 5 % 50 mL IVPB  Status:  Discontinued     2 g 100 mL/hr over 30 Minutes Intravenous Every T-Th-Sa (1800) 03/23/14 1108 03/24/14 1505   03/24/14 1800  ceFAZolin (ANCEF) IVPB 2 g/50 mL premix     2 g 100 mL/hr over 30 Minutes Intravenous Every T-Th-Sa (1800) 03/24/14 1505     03/24/14 1200  ceFEPIme (MAXIPIME) 2 g in dextrose 5 % 50 mL IVPB  Status:  Discontinued     2 g 100 mL/hr over 30 Minutes Intravenous Every T-Th-Sa (Hemodialysis) 03/22/14 1355 03/23/14 1108   03/24/14 1200  vancomycin (VANCOCIN) IVPB 1000 mg/200 mL premix  Status:  Discontinued     1,000 mg 200 mL/hr over 60 Minutes Intravenous Every T-Th-Sa (Hemodialysis) 03/22/14 2232 03/24/14 1505   03/22/14 1400  vancomycin (VANCOCIN) 1,750 mg in sodium chloride 0.9 % 500 mL  IVPB     1,750 mg 250 mL/hr over 120 Minutes Intravenous  Once 03/22/14 1346 03/22/14 1720   03/22/14 1400  ceFEPIme (MAXIPIME) 2 g in dextrose 5 % 50 mL IVPB     2 g 100 mL/hr over 30 Minutes Intravenous  Once 03/22/14 1347 03/22/14 1521   03/22/14 1345  vancomycin (VANCOCIN) 15 mg/kg in sodium chloride 0.9 % 100 mL IVPB  Status:  Discontinued     15 mg/kg 100 mL/hr over 60 Minutes Intravenous  Once 03/22/14 1342 03/22/14 1345      Medications:  Scheduled: . amLODipine  5 mg Oral QPM  .  ceFAZolin (ANCEF) IV  2 g Intravenous Q T,Th,Sat-1800  . cinacalcet  60 mg Oral QPC supper  . darbepoetin (ARANESP) injection - DIALYSIS  40 mcg Intravenous Q Thu-HD  . heparin  5,000 Units Subcutaneous 3 times per day  . hydrALAZINE  50 mg Oral 3 times per day  . sevelamer carbonate  800 mg Oral TID WC  . sodium chloride  3 mL Intravenous Q12H    Objective: Vital signs in last 24 hours: Temp:  [97.7 F (36.5 C)-98.5 F (36.9 C)] 97.8 F (36.6 C) (09/24 1121) Pulse Rate:  [82-97] 90 (09/24 1121) Resp:  [16-35] 21 (09/24 1121) BP: (104-139)/(43-91) 119/72 mmHg (09/24 1121) SpO2:  [  92 %-100 %] 92 % (09/24 1121) Weight:  [98.521 kg (217 lb 3.2 oz)-101.8 kg (224 lb 6.9 oz)] 98.8 kg (217 lb 13 oz) (09/24 1121)   General appearance: alert, cooperative and no distress  Lab Results  Recent Labs  03/30/14 0312 03/31/14 0712 03/31/14 0713  WBC 10.5 10.5  --   HGB 9.6* 9.3*  --   HCT 29.3* 27.8*  --   NA 138  --  136*  K 4.0  --  3.9  CL 94*  --  95*  CO2 29  --  26  BUN 21  --  34*  CREATININE 5.91*  --  9.03*   Liver Panel  Recent Labs  03/29/14 0226 03/31/14 0713  ALBUMIN 2.1* 2.4*   Sedimentation Rate No results found for this basename: ESRSEDRATE,  in the last 72 hours C-Reactive Protein No results found for this basename: CRP,  in the last 72 hours  Microbiology: Recent Results (from the past 240 hour(s))  CULTURE, BLOOD (ROUTINE X 2)     Status: None   Collection  Time    03/22/14  1:52 PM      Result Value Ref Range Status   Specimen Description BLOOD ARM RIGHT   Final   Special Requests BOTTLES DRAWN AEROBIC ONLY 10CC   Final   Culture  Setup Time     Final   Value: 03/22/2014 18:55     Performed at [facility]   Culture     Final   Value: NO GROWTH 5 DAYS     Performed at [facility]   Report Status 03/28/2014 FINAL   Final  CULTURE, BLOOD (ROUTINE X 2)     Status: None   Collection Time    03/22/14  2:00 PM      Result Value Ref Range Status   Specimen Description BLOOD HAND RIGHT   Final   Special Requests BOTTLES DRAWN AEROBIC AND ANAEROBIC 10CC   Final   Culture  Setup Time     Final   Value: 03/22/2014 18:56     Performed at [facility]   Culture     Final   Value: NO GROWTH 5 DAYS     Performed at [facility]   Report Status 03/28/2014 FINAL   Final  RESPIRATORY VIRUS PANEL     Status: None   Collection Time    03/22/14  9:22 PM      Result Value Ref Range Status   Source - RVPAN NASOPHARYNGEAL   Final   Respiratory Syncytial Virus A NOT DETECTED   Final   Respiratory Syncytial Virus B NOT DETECTED   Final   Influenza A NOT DETECTED   Final   Influenza B NOT DETECTED   Final   Parainfluenza 1 NOT DETECTED   Final   Parainfluenza 2 NOT DETECTED   Final   Parainfluenza 3 NOT DETECTED   Final   Metapneumovirus NOT DETECTED   Final   Rhinovirus NOT DETECTED   Final   Adenovirus NOT DETECTED   Final   Influenza A H1 NOT DETECTED   Final   Influenza A H3 NOT DETECTED   Final   Comment: (NOTE)           Normal Reference Range for each Analyte: NOT DETECTED     Testing performed using the Luminex xTAG Respiratory Viral Panel test     kit.     The analytical performance characteristics of this  assay have been     determined by [facility].  The modifications have not been     cleared or approved by the FDA. This assay has been validated pursuant     to the CLIA regulations and is  used for clinical purposes.     Performed at [place], BLOOD (ROUTINE X 2)     Status: None   Collection Time    03/23/14  7:20 PM      Result Value Ref Range Status   Specimen Description BLOOD RIGHT HAND   Final   Special Requests BOTTLES DRAWN AEROBIC AND ANAEROBIC 5CC   Final   Culture  Setup Time     Final   Value: 03/23/2014 22:25     Performed at [facility]   Culture     Final   Value: NO GROWTH 5 DAYS     Performed at [facility]   Report Status 03/29/2014 FINAL   Final  ANAEROBIC CULTURE     Status: None   Collection Time    03/25/14  3:21 PM      Result Value Ref Range Status   Specimen Description WOUND   Final   Special Requests LEFT POPITEAL EMBOLIUS PT ON ZINACEF   Final   Gram Stain     Final   Value: NO WBC SEEN     NO SQUAMOUS EPITHELIAL CELLS SEEN     NO ORGANISMS SEEN     Performed at [facility]   Culture     Final   Value: NO ANAEROBES ISOLATED     Performed at [facility]   Report Status 03/30/2014 FINAL   Final  WOUND CULTURE     Status: None   Collection Time    03/25/14  3:21 PM      Result Value Ref Range Status   Specimen Description WOUND   Final   Special Requests LEFT POPITEAL EMBOLIUS PT ON ZINCEF   Final   Gram Stain     Final   Value: NO WBC SEEN     NO SQUAMOUS EPITHELIAL CELLS SEEN     NO ORGANISMS SEEN     Performed at [facility]   Culture     Final   Value: NO GROWTH 2 DAYS     Performed at [facility]   Report Status 03/28/2014 FINAL   Final  MRSA PCR SCREENING     Status: None   Collection Time    03/26/14  6:31 AM      Result Value Ref Range Status   MRSA by PCR NEGATIVE  NEGATIVE Final   Comment:            The GeneXpert MRSA Assay (FDA     approved for NASAL specimens     only), is one component of a     comprehensive MRSA colonization     surveillance program. It is not     intended to diagnose MRSA     infection nor to guide or     monitor  treatment for     MRSA infections.    Studies/Results: No results found.   Assessment/Plan: MV Endocarditis  Staph bacteremia (May 2015, August 2015)   MSSA (R-PEN) 12-02-13 ESRD  Total days of antibiotics: 7 vanco/cefepime ---> ancef  TEE: MV IE with severe MR  Awaiting MVR No change in anbx Dr Linus Salmons available if questions over w/e.  Bobby Rumpf Infectious Diseases (pager) [phone] [email or web address] 03/31/2014, 11:24 AM  LOS: 9 days

## 2014-03-31 NOTE — Progress Notes (Signed)
LOS: 9 days   Subjective: - NAEON - Worked with PT yesterday and Cardiac Rehab- ambulated 110 ft; could wt bear on L leg, "stiffness" got better with ambulation - No prn Percocet used o/n - dialysis again today  Objective: BP 120/69  Pulse 85  Temp(Src) 98.5 F (36.9 C) (Oral)  Resp 16  Ht 5\' 11"  (1.803 m)  Wt 101.8 kg (224 lb 6.9 oz)  BMI 31.32 kg/m2  SpO2 96%  Intake/Output Summary (Last 24 hours) at 03/31/14 0957 Last data filed at 03/30/14 1848  Gross per 24 hour  Intake    280 ml  Output      0 ml  Net    280 ml    Physical Exam: GEN: no notable diaphoresis, awake and oriented, no acute distress EYES: EOMI CV: II/VI holosystolic murmur greatest at mid-clavicular line, tachycardic, S1/S2 no gallop PULM: CTAB, normal work of breathing  ABD: soft, NT/ND, +BS EXT: minimal trace edema, 2+ pulses bilaterally LE, much improved L sided lower extremity pulse s/p pop bypass; incisions clean dry and intact  Labs/Studies: I have reviewed labs and studies from last 24hrs per EMR.  Medications: I have reviewed the patient's current medications.  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  MSSA MV Infective  Endocarditis: Per nephrology notes, had cultures positive for MSSA recently and had been treated with cefazolin qHD. Being followed by cards, ID, CT surgery, vascular surgery; appreciate all recommendations.  Diagnostic Timeline *9/16 ECHO: small mobile vegetation on posterior leaflet *9/17 TEE: severe MR *9/18 MRI head negative for embolic events.  *9/18 MRI L knee: muscle injury posteriorly with edema in the biceps femoris, medial head gastrocnemius, and lateral head gastrocnemius; per ortho not a surgical issue.  *9/21 L&RHC: Increased L and R sided filling pressure, severe MR, no coronary disease; prominent V-waves in PCWP tracing *9/22 completed dental c/s, had orthopanthogram, deferred any treatment prior to surgery but stated understanding of need for pre-dental procedure abx. - per ID, ancef 2g qHD (9/17-) s/p vanc/cefepime (9/15-9/17); will need 6 weeks of abx coverage starting on date of anticipated surgery  - per cardiac surgery; plan to removed as much fluid as possible with HD this week to reduce filling pressures prior to tentative MV repair vs replacement next Tuesday (04/05/2014) - PT to increase mobility and condition pt in preparation for surgery next week  L popliteal mycotic embol s/p embolectomy/bypass: L popliteal embolic found on 0000000 Ao and Bifem CTA. Went to OR with Dr. Donnetta Hutching (vasc surg) on 9/18 for embolectomy followed by above to below the knee reversed saphenous vein bypass. 2-3+ BLE pulses post-operatively. CTA also revealed SMA mycotic aneurysm and probable 4.1 cm septic abscess vs hemangioma. No abdominal complaints or positive exam  findings. - CTM  Prolonged QTc, Stable: QTc 553 on 9/23. Pt not on any QT-prolonging drugs. Mg checked and was 2.2 - CTM - Avoid QT prolonging drugs  Anemia: Normocytic. Likely acute blood loss anemia on top of anemia of chronic kidney disease. Hb down to 7.0 post-operatively from 8.2 with a baseline around 11. Appropriate bump to 9.0 s/p  2upRBC in HD. 9/20 Iron studies revealed low serum iron with low TIBC and very high ferritin; c/w anemia of chronic renal disease.  - continue aranesp 40 mcg IV every thusday dialysis  Hypertension: Goaling afterload reduction in anticipation of MV surgery. - per cards- hydral 75 TID, amlodipine 10; lisinopril 20 d/c 9/21 in order to allow some permissive hypertension in attempt for aggressive fluid removal during HD  ERSD: On TTS dialysis. Reports dry wt 103 kg.  - Nephrology c/s; appreciate recs - ESA qTh; Renvela, Sensipar  Diet: Per RD, Provide Nepro Shake po BID  Dispo: Floor status  This is a Careers information officer Note.  The care of the patient was discussed with Dr. Eulas Post and the assessment and plan formulated with their assistance.  Please see their attached note for official documentation of the daily encounter.

## 2014-03-31 NOTE — Progress Notes (Signed)
UR completed Mikie Misner K. Alexsys Eskin, RN, BSN, MSHL, CCM  03/31/2014 10:59 AM

## 2014-03-31 NOTE — Progress Notes (Signed)
  Date: 03/31/2014  Patient name: Timothy Palmer  Medical record number: [id number]  Date of birth: 1965/08/06   This patient has been seen and the plan of care was discussed with the house staff. Please see their note for complete details. I concur with their findings with the following additions/corrections: Mr Filip is lying in bed. Just back from HD and eating grapes. Walked with pain in L knee area. Waiting on MV repair / replacement Tues.   Bartholomew Crews, MD 03/31/2014, 3:19 PM

## 2014-03-31 NOTE — Progress Notes (Signed)
CARDIAC REHAB PHASE I   PRE:  Rate/Rhythm: 94 SR    BP: sitting 96/54    SaO2: 95 RA  MODE:  Ambulation: 270 ft   POST:  Rate/Rhythm: 105 ST    BP: sitting 80/61, 88/72     SaO2: 95 RA  Pt feeling well. Able to walk with RW, independently. C/o tightness across his knee cap. Tolerated fairly well until end of walk, pt became diaphoretic upon sitting down. BP low. Up somewhat with rest. Rn aware. Gave pt IS and instructions for use. Declines [facility] booklet. [id number]   Josephina Shih [place] CES, ACSM 03/31/2014 3:21 PM

## 2014-03-31 NOTE — Progress Notes (Signed)
Subjective: No overnight events. Pt received HD today and is doing well. He was able ambulate yesterday with PT. CT Surgery tentatively planning on MVR for Tuesday.   Objective: Vital signs in last 24 hours: Filed Vitals:   03/31/14 1030 03/31/14 1100 03/31/14 1121 03/31/14 1351  BP: 110/60 104/73 119/72 115/69  Pulse: 89 88 90 93  Temp:   97.8 F (36.6 C) 97.6 F (36.4 C)  TempSrc:   Oral Oral  Resp: '17 24 21 20  ' Height:      Weight:   217 lb 13 oz (98.8 kg)   SpO2:   92% 90%   Weight change: -7 lb 5.3 oz (-3.325 kg)  Intake/Output Summary (Last 24 hours) at 03/31/14 1445 Last data filed at 03/31/14 1351  Gross per 24 hour  Intake    443 ml  Output   3000 ml  Net  -2557 ml   Vitals reviewed. General: Lying up in bed  HEENT: EOMI Cardiac: RRR, 3/6 holosystolic murmur at the apex Pulm: Clear to auscultation bilaterally, no wheezes, rales, or rhonchi Abd: Soft, nontender, nondistended, BS present Ext: Warm and well perfused. 2+ DP pulse in bilateral feet   Neuro: Alert and oriented X3, moves all 4 extremities, nonfocal   Lab Results: Basic Metabolic Panel:  Recent Labs Lab 03/29/14 0226 03/30/14 0312 03/31/14 0713  NA 138 138 136*  K 4.2 4.0 3.9  CL 96 94* 95*  CO2 '26 29 26  ' GLUCOSE 94 96 101*  BUN 22 21 34*  CREATININE 7.27* 5.91* 9.03*  CALCIUM 10.3 10.9* 11.0*  MG  --  2.2  --   PHOS 4.7*  --  6.4*   CBC:  Recent Labs Lab 03/30/14 0312 03/31/14 0712  WBC 10.5 10.5  HGB 9.6* 9.3*  HCT 29.3* 27.8*  MCV 91.6 90.6  PLT 298 317   Cardiac Enzymes: No results found for this basename: CKTOTAL, CKMB, CKMBINDEX, TROPONINI,  in the last 168 hours BNP: No results found for this basename: PROBNP,  in the last 168 hours  Urinalysis: No results found for this basename: COLORURINE, APPERANCEUR, LABSPEC, PHURINE, GLUCOSEU, HGBUR, BILIRUBINUR, KETONESUR, PROTEINUR, UROBILINOGEN, NITRITE, LEUKOCYTESUR,  in the last 168 hours    Micro Results: Recent  Results (from the past 240 hour(s))  CULTURE, BLOOD (ROUTINE X 2)     Status: None   Collection Time    03/22/14  1:52 PM      Result Value Ref Range Status   Specimen Description BLOOD ARM RIGHT   Final   Special Requests BOTTLES DRAWN AEROBIC ONLY 10CC   Final   Culture  Setup Time     Final   Value: 03/22/2014 18:55     Performed at [facility]   Culture     Final   Value: NO GROWTH 5 DAYS     Performed at [facility]   Report Status 03/28/2014 FINAL   Final  CULTURE, BLOOD (ROUTINE X 2)     Status: None   Collection Time    03/22/14  2:00 PM      Result Value Ref Range Status   Specimen Description BLOOD HAND RIGHT   Final   Special Requests BOTTLES DRAWN AEROBIC AND ANAEROBIC 10CC   Final   Culture  Setup Time     Final   Value: 03/22/2014 18:56     Performed at [place]     Final   Value: NO GROWTH 5  DAYS     Performed at [facility]   Report Status 03/28/2014 FINAL   Final  RESPIRATORY VIRUS PANEL     Status: None   Collection Time    03/22/14  9:22 PM      Result Value Ref Range Status   Source - RVPAN NASOPHARYNGEAL   Final   Respiratory Syncytial Virus A NOT DETECTED   Final   Respiratory Syncytial Virus B NOT DETECTED   Final   Influenza A NOT DETECTED   Final   Influenza B NOT DETECTED   Final   Parainfluenza 1 NOT DETECTED   Final   Parainfluenza 2 NOT DETECTED   Final   Parainfluenza 3 NOT DETECTED   Final   Metapneumovirus NOT DETECTED   Final   Rhinovirus NOT DETECTED   Final   Adenovirus NOT DETECTED   Final   Influenza A H1 NOT DETECTED   Final   Influenza A H3 NOT DETECTED   Final   Comment: (NOTE)           Normal Reference Range for each Analyte: NOT DETECTED     Testing performed using the Luminex xTAG Respiratory Viral Panel test     kit.     The analytical performance characteristics of this assay have been     determined by [facility].  The modifications have not been     cleared or  approved by the FDA. This assay has been validated pursuant     to the CLIA regulations and is used for clinical purposes.     Performed at [place], BLOOD (ROUTINE X 2)     Status: None   Collection Time    03/23/14  7:20 PM      Result Value Ref Range Status   Specimen Description BLOOD RIGHT HAND   Final   Special Requests BOTTLES DRAWN AEROBIC AND ANAEROBIC 5CC   Final   Culture  Setup Time     Final   Value: 03/23/2014 22:25     Performed at [facility]   Culture     Final   Value: NO GROWTH 5 DAYS     Performed at [facility]   Report Status 03/29/2014 FINAL   Final  ANAEROBIC CULTURE     Status: None   Collection Time    03/25/14  3:21 PM      Result Value Ref Range Status   Specimen Description WOUND   Final   Special Requests LEFT POPITEAL EMBOLIUS PT ON ZINACEF   Final   Gram Stain     Final   Value: NO WBC SEEN     NO SQUAMOUS EPITHELIAL CELLS SEEN     NO ORGANISMS SEEN     Performed at [facility]   Culture     Final   Value: NO ANAEROBES ISOLATED     Performed at [facility]   Report Status 03/30/2014 FINAL   Final  WOUND CULTURE     Status: None   Collection Time    03/25/14  3:21 PM      Result Value Ref Range Status   Specimen Description WOUND   Final   Special Requests LEFT POPITEAL EMBOLIUS PT ON ZINCEF   Final   Gram Stain     Final   Value: NO WBC SEEN     NO SQUAMOUS EPITHELIAL CELLS SEEN     NO ORGANISMS SEEN  Performed at [facility]     Final   Value: NO GROWTH 2 DAYS     Performed at [facility]   Report Status 03/28/2014 FINAL   Final  MRSA PCR SCREENING     Status: None   Collection Time    03/26/14  6:31 AM      Result Value Ref Range Status   MRSA by PCR NEGATIVE  NEGATIVE Final   Comment:            The GeneXpert MRSA Assay (FDA     approved for NASAL specimens     only), is one component of a     comprehensive MRSA colonization      surveillance program. It is not     intended to diagnose MRSA     infection nor to guide or     monitor treatment for     MRSA infections.   Studies/Results: No results found.  Medications: I have reviewed the patient's current medications. Scheduled Meds: . amLODipine  5 mg Oral QPM  .  ceFAZolin (ANCEF) IV  2 g Intravenous Q T,Th,Sat-1800  . cinacalcet  60 mg Oral QPC supper  . darbepoetin (ARANESP) injection - DIALYSIS  40 mcg Intravenous Q Thu-HD  . heparin  5,000 Units Subcutaneous 3 times per day  . hydrALAZINE  50 mg Oral 3 times per day  . sevelamer carbonate  800 mg Oral TID WC  . sodium chloride  3 mL Intravenous Q12H   Continuous Infusions: . sodium chloride 10 mL/hr (03/25/14 1338)   PRN Meds:.sodium chloride, sodium chloride, acetaminophen, feeding supplement (NEPRO CARB STEADY), heparin, heparin, lidocaine (PF), lidocaine-prilocaine, ondansetron (ZOFRAN) IV, oxyCODONE-acetaminophen, pentafluoroprop-tetrafluoroeth  Assessment/Plan: 48yo M w/ PMH HTN and ESRD on HD presents to the ED with increasing SOB in the setting of a productive cough and fever and was found to have endocarditis.    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 TEE with similar findings and severe mitral regurgitation. Per Nephrology, +MSSA blood culture 5/15 and 02/2014, although negative 01/2014; treated with cefazolin 2g with HD starting 8/20 to continue until 10/20. Initially started on Vanc/Zosyn which was transitioned to Ancef given recent cultures. 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 Also with 4cm probable septic abscess in upper pole of the spleen. Pt is post-op day #6 s/p left fem-pop bypass and since the surgery continues to do well. Left and right heart  cath 9/21 with no CAD, severe MR and pulmonary HTN. Valve replacement likely next week. Continuing Ancef for 6 weeks from mitral valve replacement, per ID. Pt wishes to defer dental treatment at this time.  - ID consult, appreciate recommendations - Cardiology consult, appreciate recommendations  - CT Surgery consult, appreciate recommendations - VVS consult, appreciate recommendations - Continue Ancef per ID recommendations    - Blood cultures, NGTD  Acute heart failure: CXR and CTA concerning for CHF. TTE and TTE with mitral valve vegetation and severe regurgitation, also concern for perforation. Normal EF. Right and left cardiac cath as noted above. CT Surgery to perform MVR. Pt undergoing HD TThSa, with fluid removal given increased volume status in preparation for MVR.    End stage renal disease on HD: HD TThSa. Last dialysis 9/19,  1L removed. Pt 6lbs below dry weight, renal lowering dry weight. Extra session HD 9/21 to further remove excess fluid. HD today with 3L removed.  - F/u with Nephrology, appreciate recommendations.  - HD Sat  Hypertension: Pt on ACEi at home which was continued on admission but held by Nephrology in the setting of increased fluid removal. Amlodipine and oral Hydralazine started this admission. BP well controlled.  - Amlodipine 39m daily - Hydralazine increased to 5102mq8h  T wave inversion in EKG: Improved. New diffuse T-wave inversions on EKG on admission. Troponin negative x3. Cardiology felt changes due to electolyte abnormalities. K 3.6 and Mg 1.8. Replaced K and stable since.  Recent Labs Lab 03/27/14 1457 03/28/14 0340 03/29/14 0226 03/30/14 0312 03/31/14 0713  K 4.0 3.9 4.2 4.0 3.9    QT prolongation: QTc >600 on admission when previously normal. Improved on repeat, but still prolonged. Avoiding QTc prolonging drugs. Checking Mg level.     Dispo: Disposition is deferred at this time, awaiting improvement of current medical problems.     The  patient does have a current PCP (KeLouis MeckelMD) and [facility].  The patient does not have transportation limitations that hinder transportation to clinic appointments.  .Services Needed at time of discharge: Y = Yes, Blank = No PT:   OT:   RN:   Equipment:   Other:     LOS: 9 days   KaOtho BellowsMD 03/31/2014, 2:45 PM

## 2014-03-31 NOTE — Procedures (Signed)
I was present at this dialysis session. I have reviewed the session itself and made appropriate changes.   Doing well w/o c/o.  Will achieve UF goal.    [name]  MD 03/31/2014, 10:39 AM

## 2014-04-01 ENCOUNTER — Inpatient Hospital Stay (HOSPITAL_COMMUNITY): Payer: Medicare Other

## 2014-04-01 DIAGNOSIS — D638 Anemia in other chronic diseases classified elsewhere: Secondary | ICD-10-CM

## 2014-04-01 LAB — CBC
HCT: 30.4 % — ABNORMAL LOW (ref 39.0–52.0)
Hemoglobin: 9.8 g/dL — ABNORMAL LOW (ref 13.0–17.0)
MCH: 29.9 pg (ref 26.0–34.0)
MCHC: 32.2 g/dL (ref 30.0–36.0)
MCV: 92.7 fL (ref 78.0–100.0)
PLATELETS: 328 10*3/uL (ref 150–400)
RBC: 3.28 MIL/uL — ABNORMAL LOW (ref 4.22–5.81)
RDW: 15.1 % (ref 11.5–15.5)
WBC: 12.1 10*3/uL — AB (ref 4.0–10.5)

## 2014-04-01 LAB — PULMONARY FUNCTION TEST
DL/VA % pred: 102 %
DL/VA: 4.78 ml/min/mmHg/L
DLCO UNC: 14.07 ml/min/mmHg
DLCO cor % pred: 52 %
DLCO cor: 16.91 ml/min/mmHg
DLCO unc % pred: 43 %
FEF 25-75 Post: 3.51 L/sec
FEF 25-75 Pre: 2.5 L/sec
FEF2575-%Change-Post: 40 %
FEF2575-%Pred-Post: 101 %
FEF2575-%Pred-Pre: 72 %
FEV1-%CHANGE-POST: 8 %
FEV1-%PRED-PRE: 67 %
FEV1-%Pred-Post: 72 %
FEV1-Post: 2.5 L
FEV1-Pre: 2.3 L
FEV1FVC-%Change-Post: -1 %
FEV1FVC-%Pred-Pre: 105 %
FEV6-%Change-Post: 8 %
FEV6-%PRED-POST: 70 %
FEV6-%Pred-Pre: 65 %
FEV6-Post: 2.92 L
FEV6-Pre: 2.7 L
FEV6FVC-%PRED-PRE: 103 %
FEV6FVC-%Pred-Post: 103 %
FVC-%Change-Post: 11 %
FVC-%PRED-PRE: 63 %
FVC-%Pred-Post: 70 %
FVC-POST: 3 L
FVC-Pre: 2.7 L
POST FEV1/FVC RATIO: 83 %
POST FEV6/FVC RATIO: 100 %
Pre FEV1/FVC ratio: 85 %
Pre FEV6/FVC Ratio: 100 %
RV % pred: 103 %
RV: 2.05 L
TLC % PRED: 69 %
TLC: 4.82 L

## 2014-04-01 LAB — RENAL FUNCTION PANEL
ANION GAP: 17 — AB (ref 5–15)
Albumin: 2.6 g/dL — ABNORMAL LOW (ref 3.5–5.2)
BUN: 23 mg/dL (ref 6–23)
CHLORIDE: 95 meq/L — AB (ref 96–112)
CO2: 28 mEq/L (ref 19–32)
Calcium: 10.9 mg/dL — ABNORMAL HIGH (ref 8.4–10.5)
Creatinine, Ser: 6.8 mg/dL — ABNORMAL HIGH (ref 0.50–1.35)
GFR calc Af Amer: 10 mL/min — ABNORMAL LOW (ref >90)
GFR, EST NON AFRICAN AMERICAN: 9 mL/min — AB (ref >90)
Glucose, Bld: 98 mg/dL (ref 70–99)
POTASSIUM: 4.4 meq/L (ref 3.7–5.3)
Phosphorus: 5 mg/dL — ABNORMAL HIGH (ref 2.3–4.6)
Sodium: 140 mEq/L (ref 137–147)

## 2014-04-01 MED ORDER — BOOST / RESOURCE BREEZE PO LIQD: 1.0000 | ORAL | Status: DC

## 2014-04-01 MED ORDER — ALBUTEROL SULFATE (2.5 MG/3ML) 0.083% IN NEBU
2.5000 mg | INHALATION_SOLUTION | Freq: Once | RESPIRATORY_TRACT | Status: AC
  Administered 2014-04-01: 2.5 mg via RESPIRATORY_TRACT

## 2014-04-01 MED ORDER — NEPRO/CARBSTEADY PO LIQD
237.0000 mL | ORAL | Status: DC
  Administered 2014-04-02: 237 mL via ORAL
  Filled 2014-04-01 (×5): qty 237

## 2014-04-01 MED ORDER — HYDRALAZINE HCL 25 MG PO TABS
25.0000 mg | ORAL_TABLET | Freq: Three times a day (TID) | ORAL | Status: DC
  Administered 2014-04-01 – 2014-04-05 (×9): 25 mg via ORAL
  Filled 2014-04-01 (×16): qty 1

## 2014-04-01 NOTE — Progress Notes (Addendum)
Subjective: Still with some diaphoresis but no fevers. Up ambulating yesterday. Pain in LLE improving. BP soft after HD while working with Cardiac Rehab yesterday; Cardiology decreased the Hydralazine.   Objective: Vital signs in last 24 hours: Filed Vitals:   03/31/14 1817 03/31/14 2041 04/01/14 0421 04/01/14 0549  BP: 112/58 113/63 110/64 118/66  Pulse: 93 93 88   Temp:  98.5 F (36.9 C) 98.6 F (37 C)   TempSrc:  Oral Oral   Resp:  18 18   Height:      Weight:   215 lb 14.3 oz (97.93 kg)   SpO2:  96% 98%    Weight change: -6.2 oz (-0.175 kg)  Intake/Output Summary (Last 24 hours) at 04/01/14 1218 Last data filed at 04/01/14 0959  Gross per 24 hour  Intake    993 ml  Output    200 ml  Net    793 ml   Vitals reviewed. General: Lying up in bed  HEENT: EOMI Cardiac: RRR, 3/6 holosystolic murmur at the apex Pulm: Clear to auscultation bilaterally, no wheezes, rales, or rhonchi Abd: Soft, nontender, nondistended, BS present Ext: Warm and well perfused. 2+ DP pulse in bilateral feet   Neuro: Alert and oriented X3, moves all 4 extremities, nonfocal   Lab Results: Basic Metabolic Panel:  Recent Labs Lab 03/30/14 0312 03/31/14 0713 04/01/14 0405  NA 138 136* 140  K 4.0 3.9 4.4  CL 94* 95* 95*  CO2 _0 GLUCOSE 96 101* 98  BUN 21 34* 23  CREATININE 5.91* 9.03* 6.80*  CALCIUM 10.9* 11.0* 10.9*  MG 2.2  --   --   PHOS  --  6.4* 5.0*   CBC:  Recent Labs Lab 03/31/14 0712 04/01/14 0405  WBC 10.5 12.1*  HGB 9.3* 9.8*  HCT 27.8* 30.4*  MCV 90.6 92.7  PLT 317 328   Cardiac Enzymes: No results found for this basename: CKTOTAL, CKMB, CKMBINDEX, TROPONINI,  in the last 168 hours BNP: No results found for this basename: PROBNP,  in the last 168 hours  Urinalysis: No results found for this basename: COLORURINE, APPERANCEUR, LABSPEC, PHURINE, GLUCOSEU, HGBUR, BILIRUBINUR, KETONESUR, PROTEINUR, UROBILINOGEN, NITRITE, LEUKOCYTESUR,  in the last 168  hours    Micro Results: Recent Results (from the past 240 hour(s))  CULTURE, BLOOD (ROUTINE X 2)     Status: None   Collection Time    03/22/14  1:52 PM      Result Value Ref Range Status   Specimen Description BLOOD ARM RIGHT   Final   Special Requests BOTTLES DRAWN AEROBIC ONLY 10CC   Final   Culture  Setup Time     Final   Value: 03/22/2014 18:55     Performed at [place]     Final   Value: NO GROWTH 5 DAYS     Performed at [facility]   Report Status 03/28/2014 FINAL   Final  CULTURE, BLOOD (ROUTINE X 2)     Status: None   Collection Time    03/22/14  2:00 PM      Result Value Ref Range Status   Specimen Description BLOOD HAND RIGHT   Final   Special Requests BOTTLES DRAWN AEROBIC AND ANAEROBIC 10CC   Final   Culture  Setup Time     Final   Value: 03/22/2014 18:56     Performed at [facility]   Culture     Final   Value:  NO GROWTH 5 DAYS     Performed at [facility]   Report Status 03/28/2014 FINAL   Final  RESPIRATORY VIRUS PANEL     Status: None   Collection Time    03/22/14  9:22 PM      Result Value Ref Range Status   Source - RVPAN NASOPHARYNGEAL   Final   Respiratory Syncytial Virus A NOT DETECTED   Final   Respiratory Syncytial Virus B NOT DETECTED   Final   Influenza A NOT DETECTED   Final   Influenza B NOT DETECTED   Final   Parainfluenza 1 NOT DETECTED   Final   Parainfluenza 2 NOT DETECTED   Final   Parainfluenza 3 NOT DETECTED   Final   Metapneumovirus NOT DETECTED   Final   Rhinovirus NOT DETECTED   Final   Adenovirus NOT DETECTED   Final   Influenza A H1 NOT DETECTED   Final   Influenza A H3 NOT DETECTED   Final   Comment: (NOTE)           Normal Reference Range for each Analyte: NOT DETECTED     Testing performed using the Luminex xTAG Respiratory Viral Panel test     kit.     The analytical performance characteristics of this assay have been     determined by [facility].  The  modifications have not been     cleared or approved by the FDA. This assay has been validated pursuant     to the CLIA regulations and is used for clinical purposes.     Performed at [place], BLOOD (ROUTINE X 2)     Status: None   Collection Time    03/23/14  7:20 PM      Result Value Ref Range Status   Specimen Description BLOOD RIGHT HAND   Final   Special Requests BOTTLES DRAWN AEROBIC AND ANAEROBIC 5CC   Final   Culture  Setup Time     Final   Value: 03/23/2014 22:25     Performed at [facility]   Culture     Final   Value: NO GROWTH 5 DAYS     Performed at [facility]   Report Status 03/29/2014 FINAL   Final  ANAEROBIC CULTURE     Status: None   Collection Time    03/25/14  3:21 PM      Result Value Ref Range Status   Specimen Description WOUND   Final   Special Requests LEFT POPITEAL EMBOLIUS PT ON ZINACEF   Final   Gram Stain     Final   Value: NO WBC SEEN     NO SQUAMOUS EPITHELIAL CELLS SEEN     NO ORGANISMS SEEN     Performed at [facility]   Culture     Final   Value: NO ANAEROBES ISOLATED     Performed at [facility]   Report Status 03/30/2014 FINAL   Final  WOUND CULTURE     Status: None   Collection Time    03/25/14  3:21 PM      Result Value Ref Range Status   Specimen Description WOUND   Final   Special Requests LEFT POPITEAL EMBOLIUS PT ON ZINCEF   Final   Gram Stain     Final   Value: NO WBC SEEN     NO SQUAMOUS EPITHELIAL CELLS SEEN     NO ORGANISMS  SEEN     Performed at [facility]     Final   Value: NO GROWTH 2 DAYS     Performed at [facility]   Report Status 03/28/2014 FINAL   Final  MRSA PCR SCREENING     Status: None   Collection Time    03/26/14  6:31 AM      Result Value Ref Range Status   MRSA by PCR NEGATIVE  NEGATIVE Final   Comment:            The GeneXpert MRSA Assay (FDA     approved for NASAL specimens     only), is one component of a      comprehensive MRSA colonization     surveillance program. It is not     intended to diagnose MRSA     infection nor to guide or     monitor treatment for     MRSA infections.   Studies/Results: No results found.  Medications: I have reviewed the patient's current medications. Scheduled Meds: .  ceFAZolin (ANCEF) IV  2 g Intravenous Q T,Th,Sat-1800  . cinacalcet  60 mg Oral QPC supper  . darbepoetin (ARANESP) injection - DIALYSIS  40 mcg Intravenous Q Thu-HD  . heparin  5,000 Units Subcutaneous 3 times per day  . hydrALAZINE  25 mg Oral 3 times per day  . sevelamer carbonate  800 mg Oral TID WC  . sodium chloride  3 mL Intravenous Q12H   Continuous Infusions: . sodium chloride 10 mL/hr (03/25/14 1338)   PRN Meds:.acetaminophen, ondansetron (ZOFRAN) IV, oxyCODONE-acetaminophen  Assessment/Plan: 48yo M w/ PMH HTN and ESRD on HD presents to the ED with increasing SOB in the setting of a productive cough and fever and was found to have endocarditis.    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 Per Nephrology, +MSSA blood culture 5/15 and 02/2014, although negative 01/2014; treated with cefazolin 2g with HD starting 8/20 to continue until 10/20. Initially started on Vanc/Zosyn which was transitioned to Ancef given recent cultures. 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 Also with 4cm probable septic abscess in upper pole of the spleen. Pt is post-op day #7 s/p left fem-pop bypass and since the surgery continues to do well. Left and right heart cath 9/21 with no CAD, severe MR and pulmonary HTN. Pt wishes to defer dental treatment at this time. Valve replacement likely next Tues. Continuing Ancef for 6 weeks from mitral valve replacement,  per ID.  - ID consult, appreciate recommendations - Cardiology consult, appreciate recommendations  - CT Surgery consult, appreciate recommendations - VVS consult, appreciate recommendations - Continue Ancef per ID recommendations    - Blood cultures, Negative, finalized.   Acute heart failure: CXR and CTA concerning for CHF. TTE and TTE with mitral valve vegetation and severe regurgitation, also concern for perforation. Normal EF. Right and left cardiac cath as noted above. CT Surgery to perform MVR. Pt undergoing HD TThSa, with fluid removal given increased volume status in preparation for MVR. 3L removed yesterday with HD.    End stage renal disease on HD: HD TThSa. Last dialysis 9/19, 1L removed. Pt 6lbs below dry weight, renal lowering dry weight. Extra session HD 9/21 to  further remove excess fluid. HD 9/24 with 3L removed. On sensipar and renvela for hyperparathyroidism. Ca elevated, so holding Vit D. - F/u with Nephrology, appreciate recommendations.  - Next HD Sat  Hypertension: Pt on ACEi at home which was continued on admission but held by Nephrology in the setting of increased fluid removal. Amlodipine and oral Hydralazine started this admission. Hypotension after HD yesterday and Hydralazine decreased to 31m TID form 551m - Amlodipine 23m41maily - Hydralazine 223m39mh  Anemia of chronic disease: Hgb stable around 9. BL ~8-9. Pt on aranesp per Nephrology.   T wave inversion in EKG: Improved. New diffuse T-wave inversions on EKG on admission. Troponin negative x3. Cardiology felt changes due to electolyte abnormalities. K 3.6 and Mg 1.8. Replaced K and stable since. No chest pain.  Recent Labs Lab 03/28/14 0340 03/29/14 0226 03/30/14 0312 03/31/14 0713 04/01/14 0405  K 3.9 4.2 4.0 3.9 4.4    QT prolongation: QTc >600 on admission when previously normal. Improved on repeat, but still prolonged. Avoiding QTc prolonging drugs. Mg 2.2.     Dispo: Disposition is deferred at  this time, awaiting improvement of current medical problems.     The patient does have a current PCP (KellLouis Meckel) and does not need an [facility] appointment after discharge.  The patient does not have transportation limitations that hinder transportation to clinic appointments.  .Services Needed at time of discharge: Y = Yes, Blank = No PT:   OT:   RN:   Equipment:   Other:     LOS: 10 days   KathOtho Bellows 04/01/2014, 12:18 PM

## 2014-04-01 NOTE — Progress Notes (Signed)
NUTRITION FOLLOW UP  Intervention:   Provide snacks BID Provide Nepro Shake once daily Provide Resource Breeze once daily Encourage PO intake  Nutrition Dx:   Increased nutrient needs related to chronic illness, ESRD as evidenced by estimated nutrition needs; ongoing  Goal:   Pt to meet >/= 90% of their estimated nutrition needs; unmet  Monitor:   PO intake, weight trends, labs, I/O's  Assessment:   48 year old male with PMH of HTN, ESRD on HD p/w worsening cough, SOB 1 day. Pt states that approx 3 weeks ago he developed a cough productive of yellowish sputum assoc with SOB. showed a small vegetation on the posterior leaflet possible moderate to severe MR (not well-visualized), leading to concern for endocarditis with valve perforation.  4 Days Post-Op S/P Procedure(s) (LRB):  LEFT AND RIGHT HEART CATHETERIZATION WITH CORONARY ANGIOGRAM (N/A)  Per MD note, plan for mitral valve repair/replacement on Tuesday 9/29  Pt's weight has dropped an additional 9 lbs in the past week. Per nursing notes pt is eating 100% of some meals but, only 15% of others. Pt reports that his appetite varies and that sometimes the food on the renal diet taste like cardboard so, he doesn't eat. He reports he doesn't really like the Nepro shakes but, he has been drinking 6 ounces most days. RD emphasized the importance of nutrition. Encouraged pt to eat 100% of meals. Pt agreeable to drinking Nepro Shakes or Resource breeze if meal completion is poor. RD to order snacks in between meals.    Height: Ht Readings from Last 1 Encounters:  03/30/14 5\' 11"  (1.803 m)    Weight Status:   Wt Readings from Last 1 Encounters:  04/01/14 215 lb 14.3 oz (97.93 kg)    Re-estimated needs:  Kcal: 2150-2350  Protein: 105-115 grams  Fluid: 1.2 L/day  Skin: +1 RLE edema, +2 RLE edema; closed incision on left leg  Diet Order: Renal   Intake/Output Summary (Last 24 hours) at 04/01/14 1531 Last data filed at 04/01/14  1430  Gross per 24 hour  Intake    990 ml  Output    200 ml  Net    790 ml    Last BM: 9/23   Labs:   Recent Labs Lab 03/29/14 0226 03/30/14 0312 03/31/14 0713 04/01/14 0405  NA 138 138 136* 140  K 4.2 4.0 3.9 4.4  CL 96 94* 95* 95*  CO2 26 29 26 28   BUN 22 21 34* 23  CREATININE 7.27* 5.91* 9.03* 6.80*  CALCIUM 10.3 10.9* 11.0* 10.9*  MG  --  2.2  --   --   PHOS 4.7*  --  6.4* 5.0*  GLUCOSE 94 96 101* 98    CBG (last 3)  No results found for this basename: GLUCAP,  in the last 72 hours  Scheduled Meds: .  ceFAZolin (ANCEF) IV  2 g Intravenous Q T,Th,Sat-1800  . cinacalcet  60 mg Oral QPC supper  . darbepoetin (ARANESP) injection - DIALYSIS  40 mcg Intravenous Q Thu-HD  . heparin  5,000 Units Subcutaneous 3 times per day  . hydrALAZINE  25 mg Oral 3 times per day  . sevelamer carbonate  800 mg Oral TID WC  . sodium chloride  3 mL Intravenous Q12H    Continuous Infusions: . sodium chloride 10 mL/hr (03/25/14 1338)    Pryor Ochoa RD, LDN Inpatient Clinical Dietitian Pager: [phone] After Hours Pager: [phone]

## 2014-04-01 NOTE — Progress Notes (Signed)
Pt states he had a "night sweat" last night and had to change his gown. VSS this AM. Pt with no complaints this AM.

## 2014-04-01 NOTE — Progress Notes (Signed)
  [name] Progress Note   Subjective: no complaints, resting  Filed Vitals:   03/31/14 1817 03/31/14 2041 04/01/14 0421 04/01/14 0549  BP: 112/58 113/63 110/64 118/66  Pulse: 93 93 88   Temp:  98.5 F (36.9 C) 98.6 F (37 C)   TempSrc:  Oral Oral   Resp:  18 18   Height:      Weight:   97.93 kg (215 lb 14.3 oz)   SpO2:  96% 98%    Exam: Alert, no distress Chest CTA bilat RRR 2/6 SEM Abd soft, NTND No LE edema RFA AVF +bruit Neuro is nf, Ox 3  HD: [facility]  4 hr 103kgs  2K/2Ca  Heparin 9000  > 1500 mid   500/1.5  Hectorol 9 ug TIW,  Aranesp 25 mcg q week        Assessment: 1 MV endocarditis / MSSA bacteremia- IV Ancef, for surg next wk 2 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 BPG 9/18 3 ESRD on HD 4 HTN much better, vol down 5 Anemia cont aranesp at 40/wk 6 HPTH cont sensipar, renvela, vit D on hold d/t ^Ca  Plan- Decreased BP meds, HD tomorrow    Kelly Splinter MD  pager [phone]    cell [phone]  04/01/2014, 8:47 AM     Recent Labs Lab 03/29/14 0226 03/30/14 0312 03/31/14 0713 04/01/14 0405  NA 138 138 136* 140  K 4.2 4.0 3.9 4.4  CL 96 94* 95* 95*  CO2 26 29 26 28   GLUCOSE 94 96 101* 98  BUN 22 21 34* 23  CREATININE 7.27* 5.91* 9.03* 6.80*  CALCIUM 10.3 10.9* 11.0* 10.9*  PHOS 4.7*  --  6.4* 5.0*    Recent Labs Lab 03/29/14 0226 03/31/14 0713 04/01/14 0405  ALBUMIN 2.1* 2.4* 2.6*    Recent Labs Lab 03/30/14 0312 03/31/14 0712 04/01/14 0405  WBC 10.5 10.5 12.1*  HGB 9.6* 9.3* 9.8*  HCT 29.3* 27.8* 30.4*  MCV 91.6 90.6 92.7  PLT 298 317 328   .  ceFAZolin (ANCEF) IV  2 g Intravenous Q T,Th,Sat-1800  . cinacalcet  60 mg Oral QPC supper  . darbepoetin (ARANESP) injection - DIALYSIS  40 mcg Intravenous Q Thu-HD  . heparin  5,000 Units Subcutaneous 3 times per day  . hydrALAZINE  25 mg Oral 3 times per day  . sevelamer carbonate  800 mg Oral TID WC  . sodium chloride  3 mL Intravenous Q12H   .  sodium chloride 10 mL/hr (03/25/14 1338)   sodium chloride, sodium chloride, acetaminophen, feeding supplement (NEPRO CARB STEADY), heparin, heparin, lidocaine (PF), lidocaine-prilocaine, ondansetron (ZOFRAN) IV, oxyCODONE-acetaminophen, pentafluoroprop-tetrafluoroeth

## 2014-04-01 NOTE — Progress Notes (Signed)
  Date: 04/01/2014  Patient name: Timothy Palmer  Medical record number: [id number]  Date of birth: August 01, 1965   This patient has been seen and the plan of care was discussed with the house staff. Please see their note for complete details. I concur with their findings with the following additions/corrections: No complaints today. Awaiting MV replacement / repair Tues. Otherwise, per Dr Roda Shutters note.   Bartholomew Crews, MD 04/01/2014, 1:03 PM

## 2014-04-01 NOTE — Progress Notes (Signed)
      [address]       [place] [address]             [phone]     CARDIOTHORACIC SURGERY PROGRESS NOTE  4 Days Post-Op  S/P Procedure(s) (LRB): LEFT AND RIGHT HEART CATHETERIZATION WITH CORONARY ANGIOGRAM (N/A)  Subjective: Breathing continues to slowly improve.  Ambulation improving.  Objective: Vital signs in last 24 hours: Temp:  [98.5 F (36.9 C)-98.6 F (37 C)] 98.6 F (37 C) (09/25 0421) Pulse Rate:  [88-93] 88 (09/25 0421) Cardiac Rhythm:  [-] Normal sinus rhythm (09/25 0834) Resp:  [18] 18 (09/25 0421) BP: (110-118)/(58-66) 118/66 mmHg (09/25 0549) SpO2:  [96 %-98 %] 98 % (09/25 0421) Weight:  [97.93 kg (215 lb 14.3 oz)] 97.93 kg (215 lb 14.3 oz) (09/25 0421)  Physical Exam:  Rhythm:   sinus  Breath sounds: clear  Heart sounds:  RRR w/ holosystolic murmur  Abdomen:  soft  Extremities:  warm   Intake/Output from previous day: 09/24 0701 - 09/25 0700 In: 753 [P.O.:700; I.V.:3; IV Piggyback:50] Out: 3200 [Urine:200] Intake/Output this shift: Total I/O In: 240 [P.O.:240] Out: -   Lab Results:  Recent Labs  03/31/14 0712 04/01/14 0405  WBC 10.5 12.1*  HGB 9.3* 9.8*  HCT 27.8* 30.4*  PLT 317 328   BMET:  Recent Labs  03/31/14 0713 04/01/14 0405  NA 136* 140  K 3.9 4.4  CL 95* 95*  CO2 26 28  GLUCOSE 101* 98  BUN 34* 23  CREATININE 9.03* 6.80*  CALCIUM 11.0* 10.9*    CBG (last 3)  No results found for this basename: GLUCAP,  in the last 72 hours PT/INR:  No results found for this basename: LABPROT, INR,  in the last 72 hours  CXR:  N/A  Assessment/Plan: S/P Procedure(s) (LRB): LEFT AND RIGHT HEART CATHETERIZATION WITH CORONARY ANGIOGRAM (N/A)  Mr Ruhlman continues to slowly improve. We plan for mitral valve repair/replacement on Tuesday 9/29 Patient scheduled for HD tomorrow.  I would ask Nephrology team to plan for HD again on Monday prior to surgery.  [name],[name] 04/01/2014 1:52 PM

## 2014-04-01 NOTE — Progress Notes (Signed)
Physical Therapy Treatment Patient Details Name: Timothy Palmer MRN: [id number] DOB: [birth year] Today's Date: 04/01/2014    History of Present Illness 48 y.o. male is s/p: Left popliteal embolectomy and intraoperative arteriogram & Above-knee to below-knee popliteal bypass with reversed great saphenous vein. 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 Additionally, CVTS planning on MV repair / replacement next week.    PT Comments    Pt progressing towards physical therapy goals. Educated pt on self-massage and self-mobilization of patella, quads and patellar tendon when he feels function-limiting tightness in his knee. Pt reports he feels much better after therapist performed patellar mobilization and demonstrated increased AROM of knee flex/ext. Will continue to follow and progress as able per POC.   Follow Up Recommendations   (TBD post Cardiac Surgery, most likely rehab)     Equipment Recommendations  Rolling walker with 5" wheels    Recommendations for Other Services       Precautions / Restrictions Precautions Precautions: Fall Restrictions Weight Bearing Restrictions: No    Mobility  Bed Mobility Overal bed mobility: Modified Independent             General bed mobility comments: increased time to perform, no physical assist needed  Transfers Overall transfer level: Needs assistance Equipment used: Rolling walker (2 wheeled) Transfers: Sit to/from Stand Sit to Stand: Min guard         General transfer comment: VCs for hand placement when elevating to standing, min guard for stability  Ambulation/Gait Ambulation/Gait assistance: Min guard;Supervision Ambulation Distance (Feet): 200 Feet Assistive device: Rolling walker (2 wheeled) Gait Pattern/deviations: Step-through pattern;Decreased stride length;Trunk flexed;Decreased stance time - left;Decreased step length - right Gait velocity: decreased Gait  velocity interpretation: Below normal speed for age/gender General Gait Details: Pt complaining of stiffness in the knee, and specifically mentions the knee cap. Notable limp during gait training because of this. Overall safe with the RW with no LOB.    Stairs            Wheelchair Mobility    Modified Rankin (Stroke Patients Only)       Balance Overall balance assessment: Needs assistance Sitting-balance support: Feet supported;No upper extremity supported Sitting balance-Leahy Scale: Good     Standing balance support: During functional activity;No upper extremity supported Standing balance-Leahy Scale: Fair                      Cognition Arousal/Alertness: Awake/alert Behavior During Therapy: WFL for tasks assessed/performed Overall Cognitive Status: Within Functional Limits for tasks assessed                      Exercises General Exercises - Lower Extremity Long Arc Quad: 10 reps    General Comments General comments (skin integrity, edema, etc.): Examined pt's L patella and noted hypomobility in all directions, particularly with inferior glide. Therapist performed soft tissue mobilization of patellar tendon, inferior quadriceps, and superior>inferior as well as lateral>medial glides of the patella. With retesting, pt had decreased reported stiffness in the knee and increased flexion AROM of the knee.       Pertinent Vitals/Pain Pain Assessment: No/denies pain    Home Living                      Prior Function            PT Goals (current goals can now be found in the care plan section) Acute  Rehab PT Goals Patient Stated Goal: none stated PT Goal Formulation: With patient Time For Goal Achievement: 04/13/14 Potential to Achieve Goals: Good Progress towards PT goals: Progressing toward goals    Frequency  Min 3X/week    PT Plan Current plan remains appropriate    Co-evaluation             End of Session Equipment  Utilized During Treatment: Gait belt Activity Tolerance: Patient tolerated treatment well Patient left: in chair;with call bell/phone within reach     Time: 1045-1110 PT Time Calculation (min): 25 min  Charges:  $Gait Training: 8-22 mins $Therapeutic Activity: 8-22 mins                    G Codes:      Jolyn Lent 2014-04-02, 1:32 PM  Jolyn Lent, PT, DPT Acute Rehabilitation Services Pager: [phone]

## 2014-04-01 NOTE — Progress Notes (Signed)
Patient ID: LABARON FERIA, male   DOB: 1966/04/23, 48 y.o.   MRN: [id number]    SUBJECTIVE:  HD yesterday, weight continues to fall.  Breathing improved.  Walked in hall yesterday without problems.   Scheduled Meds: .  ceFAZolin (ANCEF) IV  2 g Intravenous Q T,Th,Sat-1800  . cinacalcet  60 mg Oral QPC supper  . darbepoetin (ARANESP) injection - DIALYSIS  40 mcg Intravenous Q Thu-HD  . heparin  5,000 Units Subcutaneous 3 times per day  . hydrALAZINE  25 mg Oral 3 times per day  . sevelamer carbonate  800 mg Oral TID WC  . sodium chloride  3 mL Intravenous Q12H   Continuous Infusions: . sodium chloride 10 mL/hr (03/25/14 1338)   PRN Meds:.sodium chloride, sodium chloride, acetaminophen, feeding supplement (NEPRO CARB STEADY), heparin, heparin, lidocaine (PF), lidocaine-prilocaine, ondansetron (ZOFRAN) IV, oxyCODONE-acetaminophen, pentafluoroprop-tetrafluoroeth    Filed Vitals:   03/31/14 1817 03/31/14 2041 04/01/14 0421 04/01/14 0549  BP: 112/58 113/63 110/64 118/66  Pulse: 93 93 88   Temp:  98.5 F (36.9 C) 98.6 F (37 C)   TempSrc:  Oral Oral   Resp:  18 18   Height:      Weight:   215 lb 14.3 oz (97.93 kg)   SpO2:  96% 98%     Intake/Output Summary (Last 24 hours) at 04/01/14 0802 Last data filed at 04/01/14 0300  Gross per 24 hour  Intake    753 ml  Output   3200 ml  Net  -2447 ml    LABS: Basic Metabolic Panel:  Recent Labs  03/30/14 0312 03/31/14 0713 04/01/14 0405  NA 138 136* 140  K 4.0 3.9 4.4  CL 94* 95* 95*  CO2 29 26 28   GLUCOSE 96 101* 98  BUN 21 34* 23  CREATININE 5.91* 9.03* 6.80*  CALCIUM 10.9* 11.0* 10.9*  MG 2.2  --   --   PHOS  --  6.4* 5.0*   Liver Function Tests:  Recent Labs  03/31/14 0713 04/01/14 0405  ALBUMIN 2.4* 2.6*   No results found for this basename: LIPASE, AMYLASE,  in the last 72 hours CBC:  Recent Labs  03/31/14 0712 04/01/14 0405  WBC 10.5 12.1*  HGB 9.3* 9.8*  HCT 27.8* 30.4*  MCV 90.6 92.7  PLT 317  328   Cardiac Enzymes: No results found for this basename: CKTOTAL, CKMB, CKMBINDEX, TROPONINI,  in the last 72 hours BNP: No components found with this basename: POCBNP,  D-Dimer: No results found for this basename: DDIMER,  in the last 72 hours Hemoglobin A1C: No results found for this basename: HGBA1C,  in the last 72 hours Fasting Lipid Panel: No results found for this basename: CHOL, HDL, LDLCALC, TRIG, CHOLHDL, LDLDIRECT,  in the last 72 hours Thyroid Function Tests: No results found for this basename: TSH, T4TOTAL, FREET3, T3FREE, THYROIDAB,  in the last 72 hours Anemia Panel: No results found for this basename: VITAMINB12, FOLATE, FERRITIN, TIBC, IRON, RETICCTPCT,  in the last 72 hours  RADIOLOGY: Dg Chest 2 View  03/23/2014   CLINICAL DATA:  Weakness, leg pain  EXAM: CHEST  2 VIEW  COMPARISON:  03/22/2014  FINDINGS: Borderline cardiomegaly. Small right pleural effusion with right basilar atelectasis. Central mild vascular congestion and mild perihilar interstitial prominence suspicious for mild interstitial edema. Bony thorax is unremarkable.  IMPRESSION: Small right pleural effusion with right basilar atelectasis. Central mild vascular congestion and mild perihilar interstitial prominence suspicious for mild interstitial edema. No segmental  infiltrate.   Electronically Signed   By: Lahoma Crocker M.D.   On: 03/23/2014 09:04   Dg Chest 2 View  03/22/2014   CLINICAL DATA:  Shortness of breath  EXAM: CHEST  2 VIEW  COMPARISON:  07/23/2013  FINDINGS: There is no focal parenchymal opacity, pleural effusion, or pneumothorax. Mild pulmonary vascular congestion. There is mild cardiomegaly.  The osseous structures are unremarkable.  IMPRESSION: Mild pulmonary vascular congestion. Otherwise no active cardiopulmonary disease.   Electronically Signed   By: Kathreen Devoid   On: 03/22/2014 12:14   Ct Angio Chest W/cm &/or Wo Cm  03/22/2014   CLINICAL DATA:  Short of breath.  Cough.  EXAM: CT  ANGIOGRAPHY CHEST WITH CONTRAST  TECHNIQUE: Multidetector CT imaging of the chest was performed using the standard protocol during bolus administration of intravenous contrast. Multiplanar CT image reconstructions and MIPs were obtained to evaluate the vascular anatomy.  CONTRAST:  76mL OMNIPAQUE IOHEXOL 350 MG/ML SOLN  COMPARISON:  09/12/2012  FINDINGS: No evidence of a pulmonary embolus.  Heart is mildly enlarged. There are minor coronary artery calcifications. Great vessels are normal in caliber.  There is mediastinal adenopathy. Multiple reference measurements were made. There is a 14 mm short axis node in the right superior mediastinum. There is a 19 mm short axis prevascular lymph node adjacent the left pulmonary artery. There is a 15 mm right subcarinal lymph node. There are several other prominent to mildly enlarged lymph nodes. Although there were prominent nodes previously, the larger nodes have increased in size. There are prominent hilar lymph nodes not well defined, and without change.  Small right pleural effusion.  No left effusion.  Stable calcified pleural plaque over the right hemidiaphragm.  Lungs show mild diffuse interstitial thickening and hazy and somewhat heterogeneous airspace opacities which have a central to upper lobe predominance. Interstitial thickening is greatest in the lung bases.  Limited evaluation of the upper abdomen shows gallstones but is otherwise unremarkable.  No osteoblastic or osteolytic lesions.  Review of the MIP images confirms the above findings.  IMPRESSION: 1. No evidence of a pulmonary embolism. 2. Mild cardiomegaly, interstitial thickening and hazy areas of airspace lung opacity, as well as a small right effusion, all suggests congestive heart failure. However, there also prominent and mildly enlarged mediastinal lymph nodes. These are likely reactive. The possibility of lung infection should be considered in the proper clinical setting.   Electronically Signed    By: Lajean Manes M.D.   On: 03/22/2014 13:09   Dg Knee Ap/lat W/sunrise Left  03/23/2014   CLINICAL DATA:  Left knee pain and swelling for 3 days  EXAM: DG KNEE - 3 VIEWS  COMPARISON:  None.  FINDINGS: There is no evidence of fracture, dislocation, or joint effusion. There is no evidence of arthropathy or other focal bone abnormality. Soft tissues are unremarkable. Transverse patellar cleft incidentally noted. Vascular calcifications are noted.  IMPRESSION: Negative.   Electronically Signed   By: Conchita Paris M.D.   On: 03/23/2014 21:17    PHYSICAL EXAM General: NAD Neck: Thick, JVP 8 cm, no thyromegaly or thyroid nodule.  Lungs: Clear to auscultation bilaterally with normal respiratory effort. CV: Nondisplaced PMI.  Heart regular S1/S2, 3/6 HSM apex.  No peripheral edema.  No carotid bruit.  Normal pedal pulses.  Abdomen: Soft, nontender, no hepatosplenomegaly, no distention.  Neurologic: Alert and oriented x 3.  Psych: Normal affect. Extremities: No clubbing or cyanosis.   TELEMETRY: Reviewed telemetry pt in NSR  ASSESSMENT AND PLAN: 48 yo with ESRD on HD via left arm fistula and HTN presented with fever and dyspnea with evidence for CHF.  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  1. 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  Blood culture negative so far but had MSSA in 5/15 and 8/15.  He is getting Ancef IV now.  MRI head - No acute findings.  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  On 9/18, he had left popliteal embolectomy and above to below knee popliteal bypass.  2. Mitral regurgitation: Severe by TEE.  Small vegetation noted on the posterior leaflet.  Possible perforation.  Seen by Dr Roxy Manns, given severe MR with CHF. Plan for MV repair/replacement 9/29.   - Pre-op LHC/RHC with no coronary disease but elevated right and left heart filling pressure with prominent  V-waves in PCWP tracing.  CO was preserved.  3. CHF: Acute diastolic CHF in the setting of severe MR.  Fluid removal via HD.  Improved overall.  BP lower, hydralazine has been decreased to 25 mg tid. 4.  ESRD: per Nephrology, HD again Saturday.  Loralie Champagne 04/01/2014 8:02 AM

## 2014-04-02 DIAGNOSIS — Z01810 Encounter for preprocedural cardiovascular examination: Secondary | ICD-10-CM

## 2014-04-02 LAB — RENAL FUNCTION PANEL
ALBUMIN: 2.4 g/dL — AB (ref 3.5–5.2)
ANION GAP: 20 — AB (ref 5–15)
Albumin: 2.6 g/dL — ABNORMAL LOW (ref 3.5–5.2)
Anion gap: 18 — ABNORMAL HIGH (ref 5–15)
BUN: 33 mg/dL — ABNORMAL HIGH (ref 6–23)
BUN: 31 mg/dL — ABNORMAL HIGH (ref 6–23)
CO2: 26 mEq/L (ref 19–32)
CO2: 23 mEq/L (ref 19–32)
CREATININE: 9.56 mg/dL — AB (ref 0.50–1.35)
CREATININE: 9.3 mg/dL — AB (ref 0.50–1.35)
Calcium: 11.3 mg/dL — ABNORMAL HIGH (ref 8.4–10.5)
Calcium: 10.5 mg/dL (ref 8.4–10.5)
Chloride: 95 mEq/L — ABNORMAL LOW (ref 96–112)
Chloride: 96 mEq/L (ref 96–112)
GFR calc Af Amer: 7 mL/min — ABNORMAL LOW (ref >90)
GFR calc non Af Amer: 6 mL/min — ABNORMAL LOW (ref >90)
GFR, EST AFRICAN AMERICAN: 7 mL/min — AB (ref >90)
GFR, EST NON AFRICAN AMERICAN: 6 mL/min — AB (ref >90)
Glucose, Bld: 93 mg/dL (ref 70–99)
Glucose, Bld: 94 mg/dL (ref 70–99)
PHOSPHORUS: 7.3 mg/dL — AB (ref 2.3–4.6)
PHOSPHORUS: 6.3 mg/dL — AB (ref 2.3–4.6)
POTASSIUM: 4.2 meq/L (ref 3.7–5.3)
Potassium: 4.1 mEq/L (ref 3.7–5.3)
SODIUM: 139 meq/L (ref 137–147)
Sodium: 139 mEq/L (ref 137–147)

## 2014-04-02 LAB — CBC
HCT: 30.5 % — ABNORMAL LOW (ref 39.0–52.0)
HCT: 28.6 % — ABNORMAL LOW (ref 39.0–52.0)
HEMOGLOBIN: 9.2 g/dL — AB (ref 13.0–17.0)
Hemoglobin: 9.8 g/dL — ABNORMAL LOW (ref 13.0–17.0)
MCH: 30.6 pg (ref 26.0–34.0)
MCH: 29.5 pg (ref 26.0–34.0)
MCHC: 32.1 g/dL (ref 30.0–36.0)
MCHC: 32.2 g/dL (ref 30.0–36.0)
MCV: 95.3 fL (ref 78.0–100.0)
MCV: 91.7 fL (ref 78.0–100.0)
PLATELETS: 300 10*3/uL (ref 150–400)
Platelets: 301 10*3/uL (ref 150–400)
RBC: 3.2 MIL/uL — ABNORMAL LOW (ref 4.22–5.81)
RBC: 3.12 MIL/uL — ABNORMAL LOW (ref 4.22–5.81)
RDW: 15.2 % (ref 11.5–15.5)
RDW: 15.1 % (ref 11.5–15.5)
WBC: 11.1 10*3/uL — ABNORMAL HIGH (ref 4.0–10.5)
WBC: 10.5 10*3/uL (ref 4.0–10.5)

## 2014-04-02 NOTE — Procedures (Signed)
I was present at this dialysis session, have reviewed the session itself and made  appropriate changes  Kelly Splinter MD (pgr) [phone]    [phone] 04/02/2014, 10:20 AM

## 2014-04-02 NOTE — Progress Notes (Signed)
CARDIAC REHAB PHASE I   PRE:  Rate/Rhythm: SR 73  BP:  Supine:   Sitting: 123/80  Standing:    SaO2: 99 RA  MODE:  Ambulation: 300 ft   POST:  Rate/Rhythm: 98  BP:  Supine:   Sitting: 133/75  Standing:    SaO2: 99 RA  Up to ambulate x 1 assist and RW.  Reviewed Incentive spirometer and getting up to stand without using arms.  Pt demonstrated appropriately.  Pt back to bed per request, call bell in place, no complaints. Cherre Huger, BSN [phone]

## 2014-04-02 NOTE — Progress Notes (Signed)
Subjective: Pt in HD this morning, and states that he did not sleep well overnight as he was repeatedly awoken by people in and out of his room. He endorses an episode of diaphoresis overnight. He remains afebrile.   Objective: Vital signs in last 24 hours: Filed Vitals:   04/02/14 1030 04/02/14 1109 04/02/14 1113 04/02/14 1151  BP: 101/50 92/50 98/58  98/58  Pulse: 82 85 83 87  Temp:   97.5 F (36.4 C)   TempSrc:   Oral   Resp: 16 18 20    Height:      Weight:      SpO2:   96% 97%   Weight change: -3 lb 8.2 oz (-1.594 kg)  Intake/Output Summary (Last 24 hours) at 04/02/14 1313 Last data filed at 04/02/14 1113  Gross per 24 hour  Intake    480 ml  Output    985 ml  Net   -505 ml   Vitals reviewed. General: Lying up in bed in dialysis HEENT: EOMI Cardiac: RRR, 3/6 holosystolic murmur at the apex Pulm: Clear to auscultation bilaterally, no wheezes, rales, or rhonchi Abd: Soft, nontender, nondistended, BS present Ext: Warm and well perfused. 2+ DP pulse in bilateral feet   Neuro: Alert and oriented X3, moves all 4 extremities, nonfocal   Lab Results: Basic Metabolic Panel:  Recent Labs Lab 03/30/14 0312  04/02/14 0445 04/02/14 0716  NA 138  < > 139 139  K 4.0  < > 4.1 4.2  CL 94*  < > 95* 96  CO2 29  < > 26 23  GLUCOSE 96  < > 93 94  BUN 21  < > 33* 31*  CREATININE 5.91*  < > 9.56* 9.30*  CALCIUM 10.9*  < > 11.3* 10.5  MG 2.2  --   --   --   PHOS  --   < > 7.3* 6.3*  < > = values in this interval not displayed. CBC:  Recent Labs Lab 04/02/14 0445 04/02/14 0716  WBC 11.1* 10.5  HGB 9.8* 9.2*  HCT 30.5* 28.6*  MCV 95.3 91.7  PLT 300 301   Cardiac Enzymes: No results found for this basename: CKTOTAL, CKMB, CKMBINDEX, TROPONINI,  in the last 168 hours   Micro Results: Recent Results (from the past 240 hour(s))  CULTURE, BLOOD (ROUTINE X 2)     Status: None   Collection Time    03/23/14  7:20 PM      Result Value Ref Range Status   Specimen  Description BLOOD RIGHT HAND   Final   Special Requests BOTTLES DRAWN AEROBIC AND ANAEROBIC 5CC   Final   Culture  Setup Time     Final   Value: 03/23/2014 22:25     Performed at [facility]   Culture     Final   Value: NO GROWTH 5 DAYS     Performed at [facility]   Report Status 03/29/2014 FINAL   Final  ANAEROBIC CULTURE     Status: None   Collection Time    03/25/14  3:21 PM      Result Value Ref Range Status   Specimen Description WOUND   Final   Special Requests LEFT POPITEAL EMBOLIUS PT ON ZINACEF   Final   Gram Stain     Final   Value: NO WBC SEEN     NO SQUAMOUS EPITHELIAL CELLS SEEN     NO ORGANISMS SEEN     Performed at [facility]   Culture     Final   Value: NO ANAEROBES ISOLATED     Performed at [facility]   Report Status 03/30/2014 FINAL   Final  WOUND CULTURE     Status: None   Collection Time    03/25/14  3:21 PM      Result Value Ref Range Status   Specimen Description WOUND   Final   Special Requests LEFT POPITEAL EMBOLIUS PT ON ZINCEF   Final   Gram Stain     Final   Value: NO WBC SEEN     NO SQUAMOUS EPITHELIAL CELLS SEEN     NO ORGANISMS SEEN     Performed at [facility]   Culture     Final   Value: NO GROWTH 2 DAYS     Performed at [facility]   Report Status 03/28/2014 FINAL   Final  MRSA PCR SCREENING     Status: None   Collection Time    03/26/14  6:31 AM      Result Value Ref Range Status   MRSA by PCR NEGATIVE  NEGATIVE Final   Comment:            The GeneXpert MRSA Assay (FDA     approved for NASAL specimens     only), is one component of a     comprehensive MRSA colonization     surveillance program. It is not     intended to diagnose MRSA     infection nor to guide or     monitor treatment for     MRSA infections.   Studies/Results: No results found.  Medications: I have reviewed the patient's current medications. Scheduled Meds: .  ceFAZolin (ANCEF) IV  2 g Intravenous Q  T,Th,Sat-1800  . cinacalcet  60 mg Oral QPC supper  . darbepoetin (ARANESP) injection - DIALYSIS  40 mcg Intravenous Q Thu-HD  . feeding supplement (NEPRO CARB STEADY)  237 mL Oral Q24H  . feeding supplement (RESOURCE BREEZE)  1 Container Oral Q24H  . heparin  5,000 Units Subcutaneous 3 times per day  . hydrALAZINE  25 mg Oral 3 times per day  . sevelamer carbonate  800 mg Oral TID WC  . sodium chloride  3 mL Intravenous Q12H   Continuous Infusions: . sodium chloride 10 mL/hr (03/25/14 1338)   PRN Meds:.acetaminophen, ondansetron (ZOFRAN) IV, oxyCODONE-acetaminophen  Assessment/Plan: 48yo M w/ PMH HTN and ESRD on HD presents to the ED with increasing SOB in the setting of a productive cough and fever and was found to have endocarditis.    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 Per Nephrology, +MSSA blood culture 5/15 and 02/2014, although negative 01/2014; treated with cefazolin 2g with HD starting 8/20 to continue until 10/20. Initially started on Vanc/Zosyn which was transitioned to Ancef given recent cultures. 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 Also with 4cm probable septic abscess in upper pole of the spleen. Pt is post-op day #8 s/p left fem-pop bypass and since the surgery continues to do well. Left and right heart cath 9/21 with no CAD, severe MR and pulmonary HTN. Pt wishes to defer dental treatment at this time. Valve replacement likely next Tues. Continuing Ancef  for 6 weeks from mitral valve replacement, per ID.  - ID consult, appreciate recommendations - Cardiology consult, appreciate recommendations  - CT Surgery consult, appreciate recommendations - VVS consult, appreciate recommendations - Continue Ancef per ID  recommendations    - Blood cultures, Negative, finalized.   Acute heart failure: CXR and CTA concerning for CHF. TTE and TTE with mitral valve vegetation and severe regurgitation, also concern for perforation. Normal EF. Right and left cardiac cath as noted above. CT Surgery to perform MVR. Pt undergoing HD TThSa, with fluid removal given increased volume status in preparation for MVR. HD today with <1L removed.    End stage renal disease on HD: HD TThSa. Last dialysis 9/19, 1L removed. Renal lowered dry weight ~ 97kgs. On sensipar and renvela for hyperparathyroidism. Ca elevated, so holding Vit D. - F/u with Nephrology, appreciate recommendations.   Hypertension: Pt on ACEi at home which was continued on admission but held by Nephrology in the setting of increased fluid removal. Amlodipine and oral Hydralazine started this admission. Hypotension after HD yesterday and Hydralazine decreased to 25mg  TID from 50mg  9/25. BP soft today after HD.  - Amlodipine 5mg  daily - Hydralazine 25mg  q8h  Anemia of chronic disease: Hgb stable around 9. BL ~8-9. Pt on aranesp per Nephrology.   T wave inversion in EKG: Improved. New diffuse T-wave inversions on EKG on admission. Troponin negative x3. Cardiology felt changes due to electolyte abnormalities. K 3.6 and Mg 1.8. Replaced K and stable since. No chest pain.  Recent Labs Lab 03/30/14 0312 03/31/14 0713 04/01/14 0405 04/02/14 0445 04/02/14 0716  K 4.0 3.9 4.4 4.1 4.2    QT prolongation: QTc >600 on admission when previously normal. Improved on repeat, but still prolonged. Avoiding QTc prolonging drugs. Mg 2.2.     Dispo: Disposition is deferred at this time, awaiting improvement of current medical problems.     The patient does have a current PCP (Louis Meckel, MD) and does not need an [facility] hospital follow-up appointment after discharge.  The patient does not have transportation limitations that hinder transportation to clinic  appointments.  .Services Needed at time of discharge: Y = Yes, Blank = No PT:   OT:   RN:   Equipment:   Other:     LOS: 11 days   Otho Bellows, MD 04/02/2014, 1:13 PM

## 2014-04-02 NOTE — Progress Notes (Signed)
Primary cardiologist: Dr. Loralie Champagne  Subjective:   Resting on hemodialysis. No breathlessness at rest.   Objective:   Temp:  [98 F (36.7 C)-98.4 F (36.9 C)] 98.3 F (36.8 C) (09/26 0700) Pulse Rate:  [82-90] 82 (09/26 0830) Resp:  [18-27] 23 (09/26 0830) BP: (95-134)/(45-76) 96/55 mmHg (09/26 0830) SpO2:  [97 %-100 %] 100 % (09/26 0800) Weight:  [214 lb 4.6 oz (97.2 kg)-214 lb 4.8 oz (97.206 kg)] 214 lb 4.6 oz (97.2 kg) (09/26 0700) Last BM Date: 03/30/14  Filed Weights   04/01/14 0421 04/02/14 0640 04/02/14 0700  Weight: 215 lb 14.3 oz (97.93 kg) 214 lb 4.8 oz (97.206 kg) 214 lb 4.6 oz (97.2 kg)    Intake/Output Summary (Last 24 hours) at 04/02/14 0914 Last data filed at 04/02/14 0644  Gross per 24 hour  Intake    720 ml  Output    100 ml  Net    620 ml    Telemetry: Sinus rhythm.  Exam:  General: No distress.  Lungs: Clear, nonlabored.  Cardiac: RRR, [id number] apical systolic murmur.  Extremities: No pitting.   Lab Results:  Basic Metabolic Panel:  Recent Labs Lab 03/30/14 0312  04/01/14 0405 04/02/14 0445 04/02/14 0716  NA 138  < > 140 139 139  K 4.0  < > 4.4 4.1 4.2  CL 94*  < > 95* 95* 96  CO2 29  < > 28 26 23   GLUCOSE 96  < > 98 93 94  BUN 21  < > 23 33* 31*  CREATININE 5.91*  < > 6.80* 9.56* 9.30*  CALCIUM 10.9*  < > 10.9* 11.3* 10.5  MG 2.2  --   --   --   --   < > = values in this interval not displayed.  Liver Function Tests:  Recent Labs Lab 04/01/14 0405 04/02/14 0445 04/02/14 0716  ALBUMIN 2.6* 2.6* 2.4*    CBC:  Recent Labs Lab 04/01/14 0405 04/02/14 0445 04/02/14 0716  WBC 12.1* 11.1* 10.5  HGB 9.8* 9.8* 9.2*  HCT 30.4* 30.5* 28.6*  MCV 92.7 95.3 91.7  PLT 328 300 301     Medications:   Scheduled Medications: .  ceFAZolin (ANCEF) IV  2 g Intravenous Q T,Th,Sat-1800  . cinacalcet  60 mg Oral QPC supper  . darbepoetin (ARANESP) injection - DIALYSIS  40 mcg Intravenous Q Thu-HD  . feeding supplement  (NEPRO CARB STEADY)  237 mL Oral Q24H  . feeding supplement (RESOURCE BREEZE)  1 Container Oral Q24H  . heparin  5,000 Units Subcutaneous 3 times per day  . hydrALAZINE  25 mg Oral 3 times per day  . sevelamer carbonate  800 mg Oral TID WC  . sodium chloride  3 mL Intravenous Q12H     Infusions: . sodium chloride 10 mL/hr (03/25/14 1338)     PRN Medications:  acetaminophen, ondansetron (ZOFRAN) IV, oxyCODONE-acetaminophen   Assessment:    1. MV endocarditis. Blood culture negative so far but had MSSA in 5/15 and 8/15. He is getting Ancef IV now. MRI head - No acute findings. 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 On 9/18, he had left popliteal embolectomy and above to below knee popliteal bypass.  2. Mitral regurgitation: Severe by TEE. Small vegetation noted on the posterior leaflet. Possible perforation. Seen by Dr Roxy Manns, given severe MR with CHF. Plan for MV repair/replacement 9/29.  Pre-op LHC/RHC with no coronary disease  but elevated right and left heart filling pressure with prominent V-waves in PCWP tracing. CO was preserved.   3. CHF: Acute diastolic CHF in the setting of severe MR. Fluid removal via HD. Improved overall. BP lower, hydralazine has been decreased to 25 mg tid.   4. ESRD: per Nephrology, HD today.   Plan/Discussion:    Volume management per HD. Continue antibiotics and hydralazine. For MV repair/replacement per Dr. Roxy Manns on Monday.   Satira Sark, M.D., F.A.C.C.

## 2014-04-02 NOTE — Progress Notes (Signed)
Pre-op Cardiac Surgery  Carotid Findings:  1-39% ICA stenosis.  Vertebral artery flow is antegrade.  Upper Extremity Right Left  Brachial Pressures 130 T AVG/AVF  Radial Waveforms T   Ulnar Waveforms T   Palmar Arch (Allen's Test) Doppler signal remains normal with radial compression and diminishes 50% with ulnar compression    Findings:      Lower  Extremity Right Left  Dorsalis Pedis    Anterior Tibial    Posterior Tibial    Ankle/Brachial Indices      Findings:

## 2014-04-02 NOTE — Progress Notes (Signed)
  [name] Progress Note   Subjective: no complaints, on HD  Filed Vitals:   04/02/14 0800 04/02/14 0830 04/02/14 0900 04/02/14 0930  BP: 95/45 96/55 98/58  115/60  Pulse: 83 82 83 83  Temp:      TempSrc:      Resp: 27 23 24 23   Height:      Weight:      SpO2: 100%      Exam: Alert, no distress Chest CTA bilat RRR 2/6 SEM Abd soft, NTND 1+ L lower leg edema, no R edema RFA AVF +bruit Neuro is nf, Ox 3  HD: [facility]  4 hr 103kgs  2K/2Ca  Heparin 9000  > 1500 mid   500/1.5  Hectorol 9 ug TIW,  Aranesp 25 mcg q week        Assessment: 1 MV endocarditis / MSSA bacteremia- IV Ancef, for surg on Monday 2 Septic embolus + mycotic aneurysm  LLE-  s/p fem-pop BPG 9/18 3 ESRD on HD 4 HTN better , on hydralazine 25 tid,  5 Vol excess-  resolved, dry wt will be lower around 97 kg 5 Anemia cont aranesp at 40/wk 6 HPTH cont sensipar, renvela, vit D on hold d/t [facility]  Plan- HD today    Kelly Splinter MD  pager [phone]    cell [phone]  04/02/2014, 10:18 AM     Recent Labs Lab 04/01/14 0405 04/02/14 0445 04/02/14 0716  NA 140 139 139  K 4.4 4.1 4.2  CL 95* 95* 96  CO2 28 26 23   GLUCOSE 98 93 94  BUN 23 33* 31*  CREATININE 6.80* 9.56* 9.30*  CALCIUM 10.9* 11.3* 10.5  PHOS 5.0* 7.3* 6.3*    Recent Labs Lab 04/01/14 0405 04/02/14 0445 04/02/14 0716  ALBUMIN 2.6* 2.6* 2.4*    Recent Labs Lab 04/01/14 0405 04/02/14 0445 04/02/14 0716  WBC 12.1* 11.1* 10.5  HGB 9.8* 9.8* 9.2*  HCT 30.4* 30.5* 28.6*  MCV 92.7 95.3 91.7  PLT 328 300 301   .  ceFAZolin (ANCEF) IV  2 g Intravenous Q T,Th,Sat-1800  . cinacalcet  60 mg Oral QPC supper  . darbepoetin (ARANESP) injection - DIALYSIS  40 mcg Intravenous Q Thu-HD  . feeding supplement (NEPRO CARB STEADY)  237 mL Oral Q24H  . feeding supplement (RESOURCE BREEZE)  1 Container Oral Q24H  . heparin  5,000 Units Subcutaneous 3 times per day  . hydrALAZINE  25 mg Oral 3 times per day  . sevelamer  carbonate  800 mg Oral TID WC  . sodium chloride  3 mL Intravenous Q12H   . sodium chloride 10 mL/hr (03/25/14 1338)   acetaminophen, ondansetron (ZOFRAN) IV, oxyCODONE-acetaminophen

## 2014-04-02 NOTE — Progress Notes (Signed)
In for ambulation in hallway.  Pt just arrived to room after HD.  Pt requested to come back later citing fatigue.  Cherre Huger, BSN

## 2014-04-03 NOTE — Progress Notes (Signed)
Subjective: Pt up and ambulating in the halls yesterday; he states that the pain in his left leg is getting better. He was able to get some rest last night. He is still having episodes of diaphoresis, last occurrence was around 4am today. He denies any chest pain or SOB.  Objective: Vital signs in last 24 hours: Filed Vitals:   04/02/14 1400 04/02/14 1437 04/02/14 2215 04/03/14 0629  BP: 96/60 130/76 126/71 135/89  Pulse:  93 88 84  Temp:  97.9 F (36.6 C) 98.2 F (36.8 C) 98 F (36.7 C)  TempSrc:  Oral Oral Oral  Resp:  20 20 20   Height:      Weight:    214 lb 15.2 oz (97.5 kg)  SpO2:  99% 100% 96%   Weight change: 10.4 oz (0.294 kg)  Intake/Output Summary (Last 24 hours) at 04/03/14 0929 Last data filed at 04/03/14 0827  Gross per 24 hour  Intake    780 ml  Output    885 ml  Net   -105 ml   Vitals reviewed. General: Lying up in bed watching TV. HEENT: EOMI Cardiac: RRR, 3/6 holosystolic murmur at the apex Pulm: Clear to auscultation bilaterally, no wheezes, rales, or rhonchi Abd: Soft, nontender, nondistended, BS present Ext: Warm and well perfused. 2+ DP pulse in bilateral feet   Neuro: Alert and oriented X3, moves all 4 extremities, nonfocal   Lab Results: Basic Metabolic Panel:  Recent Labs Lab 03/30/14 0312  04/02/14 0445 04/02/14 0716  NA 138  < > 139 139  K 4.0  < > 4.1 4.2  CL 94*  < > 95* 96  CO2 29  < > 26 23  GLUCOSE 96  < > 93 94  BUN 21  < > 33* 31*  CREATININE 5.91*  < > 9.56* 9.30*  CALCIUM 10.9*  < > 11.3* 10.5  MG 2.2  --   --   --   PHOS  --   < > 7.3* 6.3*  < > = values in this interval not displayed. CBC:  Recent Labs Lab 04/02/14 0445 04/02/14 0716  WBC 11.1* 10.5  HGB 9.8* 9.2*  HCT 30.5* 28.6*  MCV 95.3 91.7  PLT 300 301   Cardiac Enzymes: No results found for this basename: CKTOTAL, CKMB, CKMBINDEX, TROPONINI,  in the last 168 hours   Micro Results: Recent Results (from the past 240 hour(s))  ANAEROBIC CULTURE      Status: None   Collection Time    03/25/14  3:21 PM      Result Value Ref Range Status   Specimen Description WOUND   Final   Special Requests LEFT POPITEAL EMBOLIUS PT ON ZINACEF   Final   Gram Stain     Final   Value: NO WBC SEEN     NO SQUAMOUS EPITHELIAL CELLS SEEN     NO ORGANISMS SEEN     Performed at [facility]   Culture     Final   Value: NO ANAEROBES ISOLATED     Performed at [facility]   Report Status 03/30/2014 FINAL   Final  WOUND CULTURE     Status: None   Collection Time    03/25/14  3:21 PM      Result Value Ref Range Status   Specimen Description WOUND   Final   Special Requests LEFT POPITEAL EMBOLIUS PT ON ZINCEF   Final   Gram Stain     Final  Value: NO WBC SEEN     NO SQUAMOUS EPITHELIAL CELLS SEEN     NO ORGANISMS SEEN     Performed at [facility]   Culture     Final   Value: NO GROWTH 2 DAYS     Performed at [facility]   Report Status 03/28/2014 FINAL   Final  MRSA PCR SCREENING     Status: None   Collection Time    03/26/14  6:31 AM      Result Value Ref Range Status   MRSA by PCR NEGATIVE  NEGATIVE Final   Comment:            The GeneXpert MRSA Assay (FDA     approved for NASAL specimens     only), is one component of a     comprehensive MRSA colonization     surveillance program. It is not     intended to diagnose MRSA     infection nor to guide or     monitor treatment for     MRSA infections.   Studies/Results: No results found.  Medications: I have reviewed the patient's current medications. Scheduled Meds: .  ceFAZolin (ANCEF) IV  2 g Intravenous Q T,Th,Sat-1800  . cinacalcet  60 mg Oral QPC supper  . darbepoetin (ARANESP) injection - DIALYSIS  40 mcg Intravenous Q Thu-HD  . feeding supplement (NEPRO CARB STEADY)  237 mL Oral Q24H  . feeding supplement (RESOURCE BREEZE)  1 Container Oral Q24H  . heparin  5,000 Units Subcutaneous 3 times per day  . hydrALAZINE  25 mg Oral 3 times per day  .  sevelamer carbonate  800 mg Oral TID WC  . sodium chloride  3 mL Intravenous Q12H   Continuous Infusions: . sodium chloride 10 mL/hr (03/25/14 1338)   PRN Meds:.acetaminophen, ondansetron (ZOFRAN) IV, oxyCODONE-acetaminophen  Assessment/Plan: 48yo M w/ PMH HTN and ESRD on HD presents to the ED with increasing SOB in the setting of a productive cough and fever and was found to have endocarditis.    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 Per Nephrology, +MSSA blood culture 5/15 and 02/2014, although negative 01/2014; treated with cefazolin 2g with HD starting 8/20 to continue until 10/20. Initially started on Vanc/Zosyn which was transitioned to Ancef given recent cultures, our cultures are all negative. 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 Also with 4cm probable septic abscess in upper pole of the spleen. Pt is post-op day #9 s/p left fem-pop bypass due to embolus, and since the surgery he continues to do well. Left and right heart cath 9/21 with no CAD, severe MR and pulmonary HTN. Valve replacement likely on Tues, 9/29 with CT Surgery. Continuing Ancef for 6 weeks from mitral valve replacement, per ID.  - ID consult, appreciate recommendations - Cardiology consult, appreciate recommendations  - CT Surgery consult, appreciate recommendations - VVS consult, appreciate recommendations - Continue Ancef per ID recommendations    - Blood cultures, Negative, finalized.   Acute heart failure: CXR and CTA concerning for CHF. TTE and TTE with mitral valve vegetation and severe regurgitation, also concern for perforation. Normal EF. Right and left cardiac cath as noted above. CT Surgery to perform MVR. Pt undergoing HD TThSa, with  fluid removal  given increased volume status in preparation for MVR.   End stage renal disease on HD: HD TThSa. Last dialysis 9/26. Renal lowered dry weight ~ 97kgs. On sensipar and renvela for hyperparathyroidism. Ca elevated, so holding Vit D. - F/u with Nephrology, appreciate recommendations.   Hypertension: Pt on ACEi at home which was continued on admission but held by Nephrology in the setting of increased fluid removal. Amlodipine and oral Hydralazine started this admission. Hypotension after HD and Hydralazine decreased to 25mg  TID from 50mg  9/25 and Amlodipine held. BP stable.  - Hydralazine 25mg  q8h  Anemia of chronic disease: Hgb stable around 9. BL ~8-9. Pt on aranesp per Nephrology.   Recent Labs Lab 03/30/14 0312 03/31/14 0712 04/01/14 0405 04/02/14 0445 04/02/14 0716  HGB 9.6* 9.3* 9.8* 9.8* 9.2*    T wave inversion in EKG: New diffuse T-wave inversions on EKG on admission. Troponin negative x3. Cardiology felt changes due to electolyte abnormalities. K 3.6 and Mg 1.8. Replaced K and stable since. No chest pain.  Recent Labs Lab 03/30/14 0312 03/31/14 0713 04/01/14 0405 04/02/14 0445 04/02/14 0716  K 4.0 3.9 4.4 4.1 4.2    QT prolongation: QTc >600 on admission when previously normal. Improved on repeat, but still prolonged. Avoiding QTc prolonging drugs. Mg 2.2.     Dispo: Disposition is deferred at this time, awaiting improvement of current medical problems.     The patient does have a current PCP (Louis Meckel, MD) and does not need an [facility] hospital follow-up appointment after discharge.  The patient does not have transportation limitations that hinder transportation to clinic appointments.  .Services Needed at time of discharge: Y = Yes, Blank = No PT:   OT:   RN:   Equipment:   Other:     LOS: 12 days   Otho Bellows, MD 04/03/2014, 9:29 AM

## 2014-04-03 NOTE — Progress Notes (Signed)
Subjective:   Feeling well, no complaints. HD tomorrow - for valve replacement Monday.  Objective Filed Vitals:   04/02/14 1400 04/02/14 1437 04/02/14 2215 04/03/14 0629  BP: 96/60 130/76 126/71 135/89  Pulse:  93 88 84  Temp:  97.9 F (36.6 C) 98.2 F (36.8 C) 98 F (36.7 C)  TempSrc:  Oral Oral Oral  Resp:  20 20 20   Height:      Weight:    97.5 kg (214 lb 15.2 oz)  SpO2:  99% 100% 96%   Physical Exam General: alert and oriented. No acute distress Heart: RRR. 2.6 systolic murmur Lungs: CTA. Unlabored Abdomen: soft, nontender +BS Extremities: trace LE edema Dialysis Access: R AVF +b/t  HD: [facility]  4 hr 103kgs 2K/2Ca Heparin 9000 > 1500 mid 500/1.5  Hectorol 9 ug TIW, Aranesp 25 mcg q week   Assessment/Plan: 1 MV endocarditis / MSSA bacteremia- IV Ancef- for 6 weeks, for valve replacement Tuesday. Blood cultures negative. 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  HD tomorrow- anticipate OR tuesday 4 HTN 135/89, on hydralazine 25 tid 5 Vol excess- resolved, dry wt will be lower around 97 kg.  5 Anemia hgb 9.2 aranesp at 40/wk  6 HPTH cont sensipar, renvela, vit D on hold d/t Adella Nissen, NP [place] [phone] 04/03/2014,10:53 AM  LOS: 12 days   Pt seen, examined and agree w A/P as above.  Kelly Splinter MD pager [phone]    cell [phone] 04/03/2014, 1:19 PM    Additional Objective Labs: Basic Metabolic Panel:  Recent Labs Lab 04/01/14 0405 04/02/14 0445 04/02/14 0716  NA 140 139 139  K 4.4 4.1 4.2  CL 95* 95* 96  CO2 28 26 23   GLUCOSE 98 93 94  BUN 23 33* 31*  CREATININE 6.80* 9.56* 9.30*  CALCIUM 10.9* 11.3* 10.5  PHOS 5.0* 7.3* 6.3*   Liver Function Tests:  Recent Labs Lab 04/01/14 0405 04/02/14 0445 04/02/14 0716  ALBUMIN 2.6* 2.6* 2.4*   No results found for this basename: LIPASE, AMYLASE,  in the last 168 hours CBC:  Recent Labs Lab  03/30/14 0312 03/31/14 0712 04/01/14 0405 04/02/14 0445 04/02/14 0716  WBC 10.5 10.5 12.1* 11.1* 10.5  HGB 9.6* 9.3* 9.8* 9.8* 9.2*  HCT 29.3* 27.8* 30.4* 30.5* 28.6*  MCV 91.6 90.6 92.7 95.3 91.7  PLT 298 317 328 300 301   Blood Culture    Component Value Date/Time   SDES WOUND 03/25/2014 1521   SDES WOUND 03/25/2014 1521   SPECREQUEST LEFT POPITEAL EMBOLIUS PT ON ZINACEF 03/25/2014 1521   SPECREQUEST LEFT POPITEAL EMBOLIUS PT ON ZINCEF 03/25/2014 1521   CULT  Value: NO ANAEROBES ISOLATED Performed at [facility] 03/25/2014 1521   CULT  Value: NO GROWTH 2 DAYS Performed at [place] 03/25/2014 1521   REPTSTATUS 03/30/2014 FINAL 03/25/2014 1521   REPTSTATUS 03/28/2014 FINAL 03/25/2014 1521    Cardiac Enzymes: No results found for this basename: CKTOTAL, CKMB, CKMBINDEX, TROPONINI,  in the last 168 hours CBG: No results found for this basename: GLUCAP,  in the last 168 hours Iron Studies: No results found for this basename: IRON, TIBC, TRANSFERRIN, FERRITIN,  in the last 72 hours @lablastinr3 @ Studies/Results: No results found. Medications: . sodium chloride 10 mL/hr (03/25/14 1338)   .  ceFAZolin (ANCEF) IV  2 g Intravenous Q T,Th,Sat-1800  . cinacalcet  60 mg Oral QPC supper  .  darbepoetin (ARANESP) injection - DIALYSIS  40 mcg Intravenous Q Thu-HD  . feeding supplement (NEPRO CARB STEADY)  237 mL Oral Q24H  . feeding supplement (RESOURCE BREEZE)  1 Container Oral Q24H  . heparin  5,000 Units Subcutaneous 3 times per day  . hydrALAZINE  25 mg Oral 3 times per day  . sevelamer carbonate  800 mg Oral TID WC  . sodium chloride  3 mL Intravenous Q12H

## 2014-04-03 NOTE — Progress Notes (Signed)
Primary cardiologist: Dr. Loralie Champagne  Subjective:   Tired this morning, having trouble sleeping. No breathlessness at rest.   Objective:   Temp:  [97.5 F (36.4 C)-98.2 F (36.8 C)] 98 F (36.7 C) (09/27 0629) Pulse Rate:  [82-93] 84 (09/27 0629) Resp:  [16-23] 20 (09/27 0629) BP: (92-135)/(50-89) 135/89 mmHg (09/27 0629) SpO2:  [96 %-100 %] 96 % (09/27 0629) Weight:  [214 lb 15.2 oz (97.5 kg)] 214 lb 15.2 oz (97.5 kg) (09/27 0629) Last BM Date: 03/30/14  Filed Weights   04/02/14 0640 04/02/14 0700 04/03/14 0629  Weight: 214 lb 4.8 oz (97.206 kg) 214 lb 4.6 oz (97.2 kg) 214 lb 15.2 oz (97.5 kg)    Intake/Output Summary (Last 24 hours) at 04/03/14 0916 Last data filed at 04/03/14 0827  Gross per 24 hour  Intake    780 ml  Output    885 ml  Net   -105 ml    Telemetry: Sinus rhythm.  Exam:  General: No distress.  Lungs: Clear, nonlabored.  Cardiac: RRR, [id number] apical systolic murmur.  Extremities: No pitting.   Lab Results:  Basic Metabolic Panel:  Recent Labs Lab 03/30/14 0312  04/01/14 0405 04/02/14 0445 04/02/14 0716  NA 138  < > 140 139 139  K 4.0  < > 4.4 4.1 4.2  CL 94*  < > 95* 95* 96  CO2 29  < > 28 26 23   GLUCOSE 96  < > 98 93 94  BUN 21  < > 23 33* 31*  CREATININE 5.91*  < > 6.80* 9.56* 9.30*  CALCIUM 10.9*  < > 10.9* 11.3* 10.5  MG 2.2  --   --   --   --   < > = values in this interval not displayed.  Liver Function Tests:  Recent Labs Lab 04/01/14 0405 04/02/14 0445 04/02/14 0716  ALBUMIN 2.6* 2.6* 2.4*    CBC:  Recent Labs Lab 04/01/14 0405 04/02/14 0445 04/02/14 0716  WBC 12.1* 11.1* 10.5  HGB 9.8* 9.8* 9.2*  HCT 30.4* 30.5* 28.6*  MCV 92.7 95.3 91.7  PLT 328 300 301     Medications:   Scheduled Medications: .  ceFAZolin (ANCEF) IV  2 g Intravenous Q T,Th,Sat-1800  . cinacalcet  60 mg Oral QPC supper  . darbepoetin (ARANESP) injection - DIALYSIS  40 mcg Intravenous Q Thu-HD  . feeding supplement (NEPRO  CARB STEADY)  237 mL Oral Q24H  . feeding supplement (RESOURCE BREEZE)  1 Container Oral Q24H  . heparin  5,000 Units Subcutaneous 3 times per day  . hydrALAZINE  25 mg Oral 3 times per day  . sevelamer carbonate  800 mg Oral TID WC  . sodium chloride  3 mL Intravenous Q12H    Infusions: . sodium chloride 10 mL/hr (03/25/14 1338)    PRN Medications: acetaminophen, ondansetron (ZOFRAN) IV, oxyCODONE-acetaminophen   Assessment:    1. MV endocarditis. Blood culture negative so far but had MSSA in 5/15 and 8/15. He is getting Ancef IV now. MRI head - No acute findings. 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 On 9/18, he had left popliteal embolectomy and above to below knee popliteal bypass.  2. Mitral regurgitation: Severe by TEE. Small vegetation noted on the posterior leaflet. Possible perforation. Seen by Dr Roxy Manns, given severe MR with CHF. Plan for MV repair/replacement 9/29.  Pre-op LHC/RHC with no coronary disease but elevated right and left heart  filling pressure with prominent V-waves in PCWP tracing. CO was preserved.   3. CHF: Acute diastolic CHF in the setting of severe MR. Fluid removal via HD. Improved overall. BP lower, hydralazine has been decreased to 25 mg tid.   4. ESRD: per Nephrology, HD today.   Plan/Discussion:    Had HD yesterday. Continue antibiotics and hydralazine. For MV repair/replacement per Dr. Roxy Manns on Monday.   Satira Sark, M.D., F.A.C.C.

## 2014-04-04 ENCOUNTER — Inpatient Hospital Stay (HOSPITAL_COMMUNITY): Payer: Medicare Other

## 2014-04-04 DIAGNOSIS — I10 Essential (primary) hypertension: Secondary | ICD-10-CM

## 2014-04-04 DIAGNOSIS — N039 Chronic nephritic syndrome with unspecified morphologic changes: Secondary | ICD-10-CM

## 2014-04-04 DIAGNOSIS — D631 Anemia in chronic kidney disease: Secondary | ICD-10-CM

## 2014-04-04 DIAGNOSIS — I2510 Atherosclerotic heart disease of native coronary artery without angina pectoris: Secondary | ICD-10-CM

## 2014-04-04 LAB — BLOOD GAS, ARTERIAL
Acid-Base Excess: 6.2 mmol/L — ABNORMAL HIGH (ref 0.0–2.0)
BICARBONATE: 29.4 meq/L — AB (ref 20.0–24.0)
Drawn by: 257081
FIO2: 0.21 %
O2 Saturation: 98.2 %
PCO2 ART: 36.2 mmHg (ref 35.0–45.0)
PH ART: 7.52 — AB (ref 7.350–7.450)
Patient temperature: 98.6
TCO2: 30.5 mmol/L (ref 0–100)
pO2, Arterial: 85.1 mmHg (ref 80.0–100.0)

## 2014-04-04 LAB — COMPREHENSIVE METABOLIC PANEL
ALK PHOS: 86 U/L (ref 39–117)
ALT: <5 U/L (ref 0–53)
ANION GAP: 17 — AB (ref 5–15)
AST: 19 U/L (ref 0–37)
Albumin: 2.6 g/dL — ABNORMAL LOW (ref 3.5–5.2)
BILIRUBIN TOTAL: 0.3 mg/dL (ref 0.3–1.2)
BUN: 32 mg/dL — ABNORMAL HIGH (ref 6–23)
CHLORIDE: 95 meq/L — AB (ref 96–112)
CO2: 26 mEq/L (ref 19–32)
CREATININE: 10 mg/dL — AB (ref 0.50–1.35)
Calcium: 10.6 mg/dL — ABNORMAL HIGH (ref 8.4–10.5)
GFR, EST AFRICAN AMERICAN: 6 mL/min — AB (ref >90)
GFR, EST NON AFRICAN AMERICAN: 5 mL/min — AB (ref >90)
GLUCOSE: 90 mg/dL (ref 70–99)
POTASSIUM: 4.4 meq/L (ref 3.7–5.3)
Sodium: 138 mEq/L (ref 137–147)
Total Protein: 7.6 g/dL (ref 6.0–8.3)

## 2014-04-04 LAB — CBC
HCT: 30 % — ABNORMAL LOW (ref 39.0–52.0)
Hemoglobin: 9.7 g/dL — ABNORMAL LOW (ref 13.0–17.0)
MCH: 30.1 pg (ref 26.0–34.0)
MCHC: 32.3 g/dL (ref 30.0–36.0)
MCV: 93.2 fL (ref 78.0–100.0)
PLATELETS: 299 10*3/uL (ref 150–400)
RBC: 3.22 MIL/uL — ABNORMAL LOW (ref 4.22–5.81)
RDW: 15.3 % (ref 11.5–15.5)
WBC: 12.4 10*3/uL — ABNORMAL HIGH (ref 4.0–10.5)

## 2014-04-04 LAB — PREALBUMIN: PREALBUMIN: 35.9 mg/dL — AB (ref 17.0–34.0)

## 2014-04-04 LAB — PHOSPHORUS: PHOSPHORUS: 6.9 mg/dL — AB (ref 2.3–4.6)

## 2014-04-04 LAB — PROTIME-INR
INR: 1.01 (ref 0.00–1.49)
PROTHROMBIN TIME: 13.3 s (ref 11.6–15.2)

## 2014-04-04 LAB — SURGICAL PCR SCREEN
MRSA, PCR: NEGATIVE
STAPHYLOCOCCUS AUREUS: NEGATIVE

## 2014-04-04 LAB — APTT: APTT: 33 s (ref 24–37)

## 2014-04-04 LAB — PRO B NATRIURETIC PEPTIDE: Pro B Natriuretic peptide (BNP): 8975 pg/mL — ABNORMAL HIGH (ref 0–125)

## 2014-04-04 MED ORDER — LIDOCAINE HCL (PF) 1 % IJ SOLN: 5.0000 mL | INTRAMUSCULAR | Status: DC | PRN

## 2014-04-04 MED ORDER — DEXMEDETOMIDINE HCL IN NACL 400 MCG/100ML IV SOLN
0.1000 ug/kg/h | INTRAVENOUS | Status: AC
  Administered 2014-04-05: 0.3 ug/kg/h via INTRAVENOUS
  Filled 2014-04-04: qty 100

## 2014-04-04 MED ORDER — HEPARIN SODIUM (PORCINE) 1000 UNIT/ML DIALYSIS: 1000.0000 [IU] | INTRAMUSCULAR | Status: DC | PRN

## 2014-04-04 MED ORDER — NEPRO/CARBSTEADY PO LIQD: 237.0000 mL | ORAL | Status: DC | PRN

## 2014-04-04 MED ORDER — CHLORHEXIDINE GLUCONATE 4 % EX LIQD
60.0000 mL | Freq: Once | CUTANEOUS | Status: AC
  Administered 2014-04-05: 4 via TOPICAL
  Filled 2014-04-04: qty 60

## 2014-04-04 MED ORDER — NEPRO/CARBSTEADY PO LIQD
237.0000 mL | ORAL | Status: DC | PRN
Start: 2014-04-04 — End: 2014-04-04
  Filled 2014-04-04: qty 237

## 2014-04-04 MED ORDER — TEMAZEPAM 15 MG PO CAPS
15.0000 mg | ORAL_CAPSULE | Freq: Once | ORAL | Status: AC | PRN
  Administered 2014-04-04: 15 mg via ORAL
  Filled 2014-04-04: qty 1

## 2014-04-04 MED ORDER — HEPARIN SODIUM (PORCINE) 1000 UNIT/ML DIALYSIS: 4000.0000 [IU] | INTRAMUSCULAR | Status: DC | PRN

## 2014-04-04 MED ORDER — ALTEPLASE 2 MG IJ SOLR
2.0000 mg | Freq: Once | INTRAMUSCULAR | Status: DC | PRN
  Filled 2014-04-04: qty 2

## 2014-04-04 MED ORDER — NITROGLYCERIN IN D5W 200-5 MCG/ML-% IV SOLN
2.0000 ug/min | INTRAVENOUS | Status: DC
  Filled 2014-04-04: qty 250

## 2014-04-04 MED ORDER — SODIUM CHLORIDE 0.9 % IV SOLN: 100.0000 mL | INTRAVENOUS | Status: DC | PRN

## 2014-04-04 MED ORDER — PENTAFLUOROPROP-TETRAFLUOROETH EX AERO: 1.0000 "application " | INHALATION_SPRAY | CUTANEOUS | Status: DC | PRN

## 2014-04-04 MED ORDER — DEXTROSE 5 % IV SOLN
750.0000 mg | INTRAVENOUS | Status: DC
  Filled 2014-04-04: qty 750

## 2014-04-04 MED ORDER — LIDOCAINE-PRILOCAINE 2.5-2.5 % EX CREA: 1.0000 "application " | TOPICAL_CREAM | CUTANEOUS | Status: DC | PRN

## 2014-04-04 MED ORDER — PLASMA-LYTE 148 IV SOLN
INTRAVENOUS | Status: DC
  Filled 2014-04-04: qty 2.5

## 2014-04-04 MED ORDER — SODIUM CHLORIDE 0.9 % IV SOLN
INTRAVENOUS | Status: DC
  Filled 2014-04-04: qty 30

## 2014-04-04 MED ORDER — SODIUM CHLORIDE 0.9 % IV SOLN
INTRAVENOUS | Status: AC
  Administered 2014-04-05: 69.8 mL/h via INTRAVENOUS
  Administered 2014-04-05: 14 mL/h via INTRAVENOUS
  Administered 2014-04-05: 14 mL via INTRAVENOUS
  Filled 2014-04-04: qty 40

## 2014-04-04 MED ORDER — SODIUM CHLORIDE 0.9 % IV SOLN
INTRAVENOUS | Status: AC
  Administered 2014-04-05: 2 [IU]/h via INTRAVENOUS
  Filled 2014-04-04: qty 2.5

## 2014-04-04 MED ORDER — HEPARIN SODIUM (PORCINE) 1000 UNIT/ML DIALYSIS
9000.0000 [IU] | Freq: Once | INTRAMUSCULAR | Status: AC
  Administered 2014-04-04: 9000 [IU] via INTRAVENOUS_CENTRAL

## 2014-04-04 MED ORDER — METOPROLOL TARTRATE 12.5 MG HALF TABLET
12.5000 mg | ORAL_TABLET | Freq: Once | ORAL | Status: AC
  Administered 2014-04-05: 12.5 mg via ORAL
  Filled 2014-04-04: qty 1

## 2014-04-04 MED ORDER — BISACODYL 5 MG PO TBEC
5.0000 mg | DELAYED_RELEASE_TABLET | Freq: Once | ORAL | Status: AC
  Administered 2014-04-04: 5 mg via ORAL
  Filled 2014-04-04: qty 1

## 2014-04-04 MED ORDER — POTASSIUM CHLORIDE 2 MEQ/ML IV SOLN
80.0000 meq | INTRAVENOUS | Status: DC
  Filled 2014-04-04: qty 40

## 2014-04-04 MED ORDER — VANCOMYCIN HCL 10 G IV SOLR
1250.0000 mg | INTRAVENOUS | Status: AC
  Administered 2014-04-05: 1250 mg via INTRAVENOUS
  Filled 2014-04-04: qty 1250

## 2014-04-04 MED ORDER — CHLORHEXIDINE GLUCONATE 4 % EX LIQD
60.0000 mL | Freq: Once | CUTANEOUS | Status: AC
  Administered 2014-04-04: 4 via TOPICAL
  Filled 2014-04-04: qty 60

## 2014-04-04 MED ORDER — PHENYLEPHRINE HCL 10 MG/ML IJ SOLN
30.0000 ug/min | INTRAMUSCULAR | Status: DC
  Filled 2014-04-04: qty 2

## 2014-04-04 MED ORDER — DEXTROSE 5 % IV SOLN
1.5000 g | INTRAVENOUS | Status: AC
  Administered 2014-04-05: 1.5 g via INTRAVENOUS
  Administered 2014-04-05: .75 g via INTRAVENOUS
  Filled 2014-04-04: qty 1.5

## 2014-04-04 MED ORDER — EPINEPHRINE HCL 1 MG/ML IJ SOLN
0.5000 ug/min | INTRAVENOUS | Status: DC
  Filled 2014-04-04: qty 4

## 2014-04-04 MED ORDER — VANCOMYCIN HCL 1000 MG IV SOLR
INTRAVENOUS | Status: AC
  Administered 2014-04-05: 09:00:00
  Filled 2014-04-04: qty 1000

## 2014-04-04 MED ORDER — LIDOCAINE-PRILOCAINE 2.5-2.5 % EX CREA
1.0000 | TOPICAL_CREAM | CUTANEOUS | Status: DC | PRN
Start: 2014-04-04 — End: 2014-04-04
  Filled 2014-04-04: qty 5

## 2014-04-04 MED ORDER — DOPAMINE-DEXTROSE 3.2-5 MG/ML-% IV SOLN
2.0000 ug/kg/min | INTRAVENOUS | Status: DC
  Filled 2014-04-04: qty 250

## 2014-04-04 MED ORDER — MAGNESIUM SULFATE 50 % IJ SOLN
40.0000 meq | INTRAMUSCULAR | Status: DC
  Filled 2014-04-04: qty 10

## 2014-04-04 MED ORDER — ALTEPLASE 2 MG IJ SOLR: 2.0000 mg | Freq: Once | INTRAMUSCULAR | Status: DC | PRN

## 2014-04-04 NOTE — Progress Notes (Signed)
UR completed Maryrose Colvin K. Dreshon Proffit, RN, BSN, [place], CCM  04/04/2014 3:44 PM

## 2014-04-04 NOTE — Progress Notes (Signed)
  Date: 04/04/2014  Patient name: Timothy Palmer  Medical record number: [id number]  Date of birth: [birth year]   This patient has been seen and the plan of care was discussed with the house staff. Please see their note for complete details. I concur with their findings with the following additions/corrections: [name] is lying comfortably in bed. He is in NAD. No dyspnea and was able to walk entire hallway and back. Still some pain in L knee and now pain R lateral thigh that is a tingling type. Moves around and gets a bit better. This could be lateral femoral cutaneous nerve impingement - will follow sx trend.   Bartholomew Crews, MD 04/04/2014, 2:18 PM

## 2014-04-04 NOTE — Progress Notes (Signed)
Subjective: Awaiting HD this a.m and Mitral valve repair tomorrow.  No overnight events  He feels comfortable.  Objective: Vital signs in last 24 hours: Filed Vitals:   04/04/14 1049 04/04/14 1056 04/04/14 1101 04/04/14 1130  BP: 129/85 127/85 128/89 129/81  Pulse: 84 84 82 81  Temp: 98.2 F (36.8 C)     TempSrc: Oral     Resp: 18     Height:      Weight:      SpO2: 98%      Weight change: 3 lb 10.4 oz (1.656 kg)  Intake/Output Summary (Last 24 hours) at 04/04/14 1225 Last data filed at 04/04/14 0805  Gross per 24 hour  Intake    360 ml  Output      1 ml  Net    359 ml   Vitals reviewed. General: Lying up in bed in dialysis HEENT: EOMI Cardiac: RRR, 3/6 holosystolic murmur at the apex Pulm: Clear to auscultation bilaterally, no wheezes, rales, or rhonchi Abd: Soft, nontender, nondistended, BS present Ext: Warm and well perfused. 2+ DP pulse in bilateral feet. Left LE surgical scar are clean and dry without signs of infection.  Neuro: Alert and oriented X3, moves all 4 extremities, nonfocal   Lab Results: Basic Metabolic Panel:  Recent Labs Lab 03/30/14 0312  04/02/14 0716 04/04/14 0415 04/04/14 1105  NA 138  < > 139 138  --   K 4.0  < > 4.2 4.4  --   CL 94*  < > 96 95*  --   CO2 29  < > 23 26  --   GLUCOSE 96  < > 94 90  --   BUN 21  < > 31* 32*  --   CREATININE 5.91*  < > 9.30* 10.00*  --   CALCIUM 10.9*  < > 10.5 10.6*  --   MG 2.2  --   --   --   --   PHOS  --   < > 6.3*  --  6.9*  < > = values in this interval not displayed. CBC:  Recent Labs Lab 04/02/14 0716 04/04/14 0415  WBC 10.5 12.4*  HGB 9.2* 9.7*  HCT 28.6* 30.0*  MCV 91.7 93.2  PLT 301 299   Cardiac Enzymes: No results found for this basename: CKTOTAL, CKMB, CKMBINDEX, TROPONINI,  in the last 168 hours   Micro Results: Recent Results (from the past 240 hour(s))  ANAEROBIC CULTURE     Status: None   Collection Time    03/25/14  3:21 PM      Result Value Ref Range Status   Specimen Description WOUND   Final   Special Requests LEFT POPITEAL EMBOLIUS PT ON ZINACEF   Final   Gram Stain     Final   Value: NO WBC SEEN     NO SQUAMOUS EPITHELIAL CELLS SEEN     NO ORGANISMS SEEN     Performed at [facility]   Culture     Final   Value: NO ANAEROBES ISOLATED     Performed at [facility]   Report Status 03/30/2014 FINAL   Final  WOUND CULTURE     Status: None   Collection Time    03/25/14  3:21 PM      Result Value Ref Range Status   Specimen Description WOUND   Final   Special Requests LEFT POPITEAL EMBOLIUS PT ON ZINCEF   Final   Gram Stain  Final   Value: NO WBC SEEN     NO SQUAMOUS EPITHELIAL CELLS SEEN     NO ORGANISMS SEEN     Performed at [facility]   Culture     Final   Value: NO GROWTH 2 DAYS     Performed at [facility]   Report Status 03/28/2014 FINAL   Final  MRSA PCR SCREENING     Status: None   Collection Time    03/26/14  6:31 AM      Result Value Ref Range Status   MRSA by PCR NEGATIVE  NEGATIVE Final   Comment:            The GeneXpert MRSA Assay (FDA     approved for NASAL specimens     only), is one component of a     comprehensive MRSA colonization     surveillance program. It is not     intended to diagnose MRSA     infection nor to guide or     monitor treatment for     MRSA infections.   Studies/Results: Dg Chest 2 View  04/04/2014   CLINICAL DATA:  CHF.  EXAM: CHEST  2 VIEW  COMPARISON:  03/29/2014.  FINDINGS: 0628 hrs. The lungs are clear without focal infiltrate, edema, pneumothorax or left pleural effusion. Probable very tiny residual right pleural effusion. Interstitial markings are diffusely coarsened with chronic features. The cardio pericardial silhouette is enlarged. Imaged bony structures of the thorax are intact. Telemetry leads overlie the chest.  IMPRESSION: Very tiny right pleural effusion with interval improvement in vascular congestion.   Electronically Signed   By: Misty Stanley M.D.   On: 04/04/2014 07:51    Medications: I have reviewed the patient's current medications. Scheduled Meds: .  ceFAZolin (ANCEF) IV  2 g Intravenous Q T,Th,Sat-1800  . cinacalcet  60 mg Oral QPC supper  . darbepoetin (ARANESP) injection - DIALYSIS  40 mcg Intravenous Q Thu-HD  . feeding supplement (NEPRO CARB STEADY)  237 mL Oral Q24H  . feeding supplement (RESOURCE BREEZE)  1 Container Oral Q24H  . hydrALAZINE  25 mg Oral 3 times per day  . sevelamer carbonate  800 mg Oral TID WC  . sodium chloride  3 mL Intravenous Q12H   Continuous Infusions: . sodium chloride 10 mL/hr (03/25/14 1338)   PRN Meds:.sodium chloride, sodium chloride, sodium chloride, sodium chloride, acetaminophen, alteplase, alteplase, feeding supplement (NEPRO CARB STEADY), feeding supplement (NEPRO CARB STEADY), heparin, heparin, [START ON 04/05/2014] heparin, lidocaine (PF), lidocaine (PF), lidocaine-prilocaine, lidocaine-prilocaine, ondansetron (ZOFRAN) IV, oxyCODONE-acetaminophen, pentafluoroprop-tetrafluoroeth, pentafluoroprop-tetrafluoroeth  Assessment/Plan: 48yo M w/ PMH HTN and ESRD on HD presents to the ED with increasing SOB in the setting of a productive cough and fever and was found to have endocarditis.    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 TTE/TEE showed a mobile vegetation on the mitral valve and possible perforation and severe MR. Per Nephrology, +MSSA blood culture 5/15 and 02/2014, although negative 01/2014; treated with cefazolin 2g with HD starting 8/20 to continue until 10/20. Initially started on Vanc/Zosyn which was transitioned to Ancef given recent cultures. 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 Also with 4cm probable septic abscess in upper pole of the spleen. Pt is post-op day  #9 s/p left fem-pop bypass  and since the surgery continues to do well. Left and right heart cath 9/21 with no CAD, severe MR and pulmonary HTN. Pt wishes to defer dental treatment at this time. Valve replacement likely tomorrow 9/29. Continuing Ancef for 6 weeks from mitral valve replacement, per ID.  - ID consult, appreciate recommendations - Cardiology consult, appreciate recommendations  - CT Surgery consult, appreciate recommendations - VVS consult, appreciate recommendations - Continue Ancef per ID recommendations    - Blood cultures, Negative, finalized.   Acute heart failure: CXR and CTA concerning for CHF. TTE and TTE with mitral valve vegetation and severe regurgitation, also concern for perforation. Normal EF. Right and left cardiac cath as noted above. CT Surgery to perform MVR possibly tomorrow. Does not appear volume overloaded today. Plan  - Pt undergoing HD TThSa, - Another HD today with <1L removed.    End stage renal disease on HD: HD TThSa. Renal lowered dry weight ~ 97kgs. On sensipar and renvela for hyperparathyroidism. Ca elevated, so holding Vit D. - F/u with Nephrology, appreciate recommendations - HD today.   Hypertension: Pt on ACEi at home which was continued on admission but held by Nephrology in the setting of increased fluid removal. Amlodipine and oral Hydralazine started this admission. Has had hypotension after HD and Hydralazine decreased to 25mg  TID from 50mg  9/25.  - off Amlodipin - cont with Hydralazine 25mg  q8h  Anemia of chronic disease: Hgb stable around 9. BL ~8-9. Pt on aranesp per Nephrology.   T wave inversion in EKG: Improved. New diffuse T-wave inversions on EKG on admission. Troponin negative x3. Cardiology felt changes due to electolyte abnormalities. K 3.6 and Mg 1.8. Replaced K and stable since. No chest pain.  Recent Labs Lab 03/31/14 0713 04/01/14 0405 04/02/14 0445 04/02/14 0716 04/04/14 0415  K 3.9 4.4 4.1 4.2 4.4    QT  prolongation: QTc >600 on admission when previously normal. Improved on repeat, but still prolonged. Avoiding QTc prolonging drugs. Mg 2.2.     Dispo: Disposition is deferred at this time, awaiting improvement of current medical problems.  Awaiting surgery.   The patient does have a current PCP (Louis Meckel, MD) and does not need an [facility] hospital follow-up appointment after discharge.  The patient does not have transportation limitations that hinder transportation to clinic appointments.  .Services Needed at time of discharge: Y = Yes, Blank = No PT:   OT:   RN:   Equipment:   Other:     LOS: 13 days   Jessee Avers, MD 04/04/2014, 12:25 PM

## 2014-04-04 NOTE — Procedures (Signed)
Patient seen on Hemodialysis. QB 500, UF goal 2.5L Treatment adjusted as needed.  Elmarie Shiley MD [facility]. Office # [phone] Pager # [phone] 11:02 AM

## 2014-04-04 NOTE — Progress Notes (Signed)
PT Cancellation Note  Patient Details Name: Timothy Palmer MRN: [id number] DOB: [birth year]   Cancelled Treatment:    Reason Eval/Treat Not Completed: Patient at procedure or test/unavailable. Pt off unit for HD at this time. Will check back this afternoon as time allows.    Jolyn Lent 04/04/2014, 11:23 AM  Jolyn Lent, PT, DPT Acute Rehabilitation Services Pager: [phone]

## 2014-04-04 NOTE — Progress Notes (Signed)
INFECTIOUS DISEASE PROGRESS NOTE  ID: Timothy Palmer is a 47 y.o. 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   Splenic infarction  Subjective: Without complaints  Abtx:  Anti-infectives   Start     Dose/Rate Route Frequency Ordered Stop   04/05/14 0400  vancomycin (VANCOCIN) 1,250 mg in sodium chloride 0.9 % 250 mL IVPB     1,250 mg 166.7 mL/hr over 90 Minutes Intravenous To Surgery 04/04/14 1418 04/06/14 0400   04/05/14 0400  cefUROXime (ZINACEF) 1.5 g in dextrose 5 % 50 mL IVPB     1.5 g 100 mL/hr over 30 Minutes Intravenous To Surgery 04/04/14 1418 04/06/14 0400   04/05/14 0400  cefUROXime (ZINACEF) 750 mg in dextrose 5 % 50 mL IVPB     750 mg 100 mL/hr over 30 Minutes Intravenous To Surgery 04/04/14 1417 04/06/14 0400   04/05/14 0400  vancomycin (VANCOCIN) 1,000 mg in sodium chloride 0.9 % 1,000 mL irrigation      Irrigation To Surgery 04/04/14 1417 04/06/14 0400   03/26/14 0200  cefUROXime (ZINACEF) 1.5 g in dextrose 5 % 50 mL IVPB     1.5 g 100 mL/hr over 30 Minutes Intravenous Every 12 hours 03/25/14 2233 03/26/14 1014   03/25/14 1300  cefUROXime (ZINACEF) 1.5 g in dextrose 5 % 50 mL IVPB     1.5 g 100 mL/hr over 30 Minutes Intravenous On call to O.R. 03/25/14 1249 03/26/14 0559   03/24/14 1800  ceFEPIme (MAXIPIME) 2 g in dextrose 5 % 50 mL IVPB  Status:  Discontinued     2 g 100 mL/hr over 30 Minutes Intravenous Every  T-Th-Sa (1800) 03/23/14 1108 03/24/14 1505   03/24/14 1800  ceFAZolin (ANCEF) IVPB 2 g/50 mL premix     2 g 100 mL/hr over 30 Minutes Intravenous Every T-Th-Sa (1800) 03/24/14 1505     03/24/14 1200  ceFEPIme (MAXIPIME) 2 g in dextrose 5 % 50 mL IVPB  Status:  Discontinued     2 g 100 mL/hr over 30 Minutes Intravenous Every T-Th-Sa (Hemodialysis) 03/22/14 1355 03/23/14 1108   03/24/14 1200  vancomycin (VANCOCIN) IVPB 1000 mg/200 mL premix  Status:  Discontinued     1,000 mg 200 mL/hr over 60 Minutes Intravenous Every T-Th-Sa (Hemodialysis) 03/22/14 2232 03/24/14 1505   03/22/14 1400  vancomycin (VANCOCIN) 1,750 mg in sodium chloride 0.9 % 500 mL IVPB     1,750 mg 250 mL/hr over 120 Minutes Intravenous  Once 03/22/14 1346 03/22/14 1720   03/22/14 1400  ceFEPIme (MAXIPIME) 2 g in dextrose 5 % 50 mL IVPB     2 g 100 mL/hr over 30 Minutes Intravenous  Once 03/22/14 1347 03/22/14 1521   03/22/14 1345  vancomycin (VANCOCIN) 15 mg/kg in sodium chloride 0.9 % 100 mL IVPB  Status:  Discontinued     15 mg/kg 100 mL/hr over 60 Minutes Intravenous  Once 03/22/14 1342 03/22/14 1345  Medications:  Scheduled: . [START ON 04/05/2014] aminocaproic acid (AMICAR) for O[facility]   Intravenous To OR  .  ceFAZolin (ANCEF) IV  2 g Intravenous Q T,Th,Sat-1800  . [START ON 04/05/2014] cefUROXime (ZINACEF)  IV  1.5 g Intravenous To OR  . [START ON 04/05/2014] cefUROXime (ZINACEF)  IV  750 mg Intravenous To OR  . chlorhexidine  60 mL Topical Once   And  . [START ON 04/05/2014] chlorhexidine  60 mL Topical Once  . cinacalcet  60 mg Oral QPC supper  . darbepoetin (ARANESP) injection - DIALYSIS  40 mcg Intravenous Q Thu-HD  . [START ON 04/05/2014] dexmedetomidine  0.1-0.7 mcg/kg/hr Intravenous To OR  . [START ON 04/05/2014] DOPamine  2-20 mcg/kg/min Intravenous To OR  . [START ON 04/05/2014] epinephrine  0.5-20 mcg/min Intravenous To OR  . feeding supplement (NEPRO CARB STEADY)  237 mL Oral Q24H  . feeding supplement  (RESOURCE BREEZE)  1 Container Oral Q24H  . [START ON 04/05/2014] heparin-papaverine-plasmalyte irrigation   Irrigation To OR  . [START ON 04/05/2014] heparin 30,000 units/NS 1000 mL solution for CELLSAVER   Other To OR  . hydrALAZINE  25 mg Oral 3 times per day  . [START ON 04/05/2014] insulin (NOVOLIN-R) infusion   Intravenous To OR  . [START ON 04/05/2014] magnesium sulfate  40 mEq Other To OR  . [START ON 04/05/2014] metoprolol tartrate  12.5 mg Oral Once  . [START ON 04/05/2014] nitroGLYCERIN  2-200 mcg/min Intravenous To OR  . [START ON 04/05/2014] phenylephrine (NEO-SYNEPHRINE) Adult infusion  30-200 mcg/min Intravenous To OR  . [START ON 04/05/2014] potassium chloride  80 mEq Other To OR  . sevelamer carbonate  800 mg Oral TID WC  . sodium chloride  3 mL Intravenous Q12H  . [START ON 04/05/2014] vancomycin 1000 mg in NS (1000 ml) irrigation for Dr. Roxy Manns case   Irrigation To OR  . [START ON 04/05/2014] vancomycin  1,250 mg Intravenous To OR    Objective: Vital signs in last 24 hours: Temp:  [98.1 F (36.7 C)-98.3 F (36.8 C)] 98.1 F (36.7 C) (09/28 1456) Pulse Rate:  [80-97] 82 (09/28 1456) Resp:  [18-20] 20 (09/28 1456) BP: (101-153)/(65-98) 101/67 mmHg (09/28 1456) SpO2:  [97 %-98 %] 97 % (09/28 1456) Weight:  [96 kg (211 lb 10.3 oz)-99.156 kg (218 lb 9.6 oz)] 96 kg (211 lb 10.3 oz) (09/28 1456)   General appearance: alert, cooperative and no distress Resp: clear to auscultation bilaterally Cardio: regular rate and rhythm GI: normal findings: bowel sounds normal and soft, non-tender Extremities: LUE avf is clean, non-tender, no increase in heat, no fluctuance.   Lab Results  Recent Labs  04/02/14 0716 04/04/14 0415  WBC 10.5 12.4*  HGB 9.2* 9.7*  HCT 28.6* 30.0*  NA 139 138  K 4.2 4.4  CL 96 95*  CO2 23 26  BUN 31* 32*  CREATININE 9.30* 10.00*   Liver Panel  Recent Labs  04/02/14 0716 04/04/14 0415  PROT  --  7.6  ALBUMIN 2.4* 2.6*  AST  --  19  ALT  --  <5   ALKPHOS  --  86  BILITOT  --  0.3   Sedimentation Rate No results found for this basename: ESRSEDRATE,  in the last 72 hours C-Reactive Protein No results found for this basename: CRP,  in the last 72 hours  Microbiology: Recent Results (from the past 240 hour(s))  MRSA PCR SCREENING     Status: None   Collection Time  03/26/14  6:31 AM      Result Value Ref Range Status   MRSA by PCR NEGATIVE  NEGATIVE Final   Comment:            The GeneXpert MRSA Assay (FDA     approved for NASAL specimens     only), is one component of a     comprehensive MRSA colonization     surveillance program. It is not     intended to diagnose MRSA     infection nor to guide or     monitor treatment for     MRSA infections.    Studies/Results: Dg Chest 2 View  04/04/2014   CLINICAL DATA:  CHF.  EXAM: CHEST  2 VIEW  COMPARISON:  03/29/2014.  FINDINGS: 0628 hrs. The lungs are clear without focal infiltrate, edema, pneumothorax or left pleural effusion. Probable very tiny residual right pleural effusion. Interstitial markings are diffusely coarsened with chronic features. The cardio pericardial silhouette is enlarged. Imaged bony structures of the thorax are intact. Telemetry leads overlie the chest.  IMPRESSION: Very tiny right pleural effusion with interval improvement in vascular congestion.   Electronically Signed   By: Misty Stanley M.D.   On: 04/04/2014 07:51     Assessment/Plan: MV Endocarditis  Staph bacteremia (May 2015, August 2015)  MSSA (R-PEN) 12-02-13  ESRD   Total days of antibiotics: 10 vanco/cefepime ---> ancef  TEE: MV IE with severe MR     For MVR in am Plan for 6 weeks of ancef after MVR (start date 9-29) His repeat BCx have been (-).  available if questions.       Timothy Palmer Infectious Diseases (pager) [phone] [email or web address] 04/04/2014, 5:00 PM  LOS: 13 days

## 2014-04-04 NOTE — Progress Notes (Signed)
[address]       [place] [address]             [phone]     CARDIOTHORACIC SURGERY PROGRESS NOTE  7 Days Post-Op  S/P Procedure(s) (LRB): LEFT AND RIGHT HEART CATHETERIZATION WITH CORONARY ANGIOGRAM (N/A)  Subjective: Feels okay.  Breathing okay.  Objective: Vital signs in last 24 hours: Temp:  [98.1 F (36.7 C)-98.3 F (36.8 C)] 98.1 F (36.7 C) (09/28 1456) Pulse Rate:  [80-97] 82 (09/28 1456) Cardiac Rhythm:  [-] Normal sinus rhythm (09/28 1526) Resp:  [18-20] 20 (09/28 1456) BP: (101-153)/(65-98) 101/67 mmHg (09/28 1456) SpO2:  [97 %-98 %] 97 % (09/28 1456) Weight:  [96 kg (211 lb 10.3 oz)-99.156 kg (218 lb 9.6 oz)] 96 kg (211 lb 10.3 oz) (09/28 1456)  Physical Exam:  Rhythm:   sinus  Breath sounds: clear  Heart sounds:  RRR w/ holosystolic murmur  Incisions:  n/a  Abdomen:  soft  Extremities:  warm   Intake/Output from previous day: 09/27 0701 - 09/28 0700 In: 240 [P.O.:240] Out: 1 [Stool:1] Intake/Output this shift: Total I/O In: 480 [P.O.:480] Out: 2200 [Urine:200; Other:2000]  Lab Results:  Recent Labs  04/02/14 0716 04/04/14 0415  WBC 10.5 12.4*  HGB 9.2* 9.7*  HCT 28.6* 30.0*  PLT 301 299   BMET:  Recent Labs  04/02/14 0716 04/04/14 0415  NA 139 138  K 4.2 4.4  CL 96 95*  CO2 23 26  GLUCOSE 94 90  BUN 31* 32*  CREATININE 9.30* 10.00*  CALCIUM 10.5 10.6*    CBG (last 3)  No results found for this basename: GLUCAP,  in the last 72 hours PT/INR:   Recent Labs  04/04/14 0415  LABPROT 13.3  INR 1.01    CXR:  CHEST 2 VIEW  COMPARISON: 03/29/2014.  FINDINGS:  0628 hrs. The lungs are clear without focal infiltrate, edema,  pneumothorax or left pleural effusion. Probable very tiny residual  right pleural effusion. Interstitial markings are diffusely  coarsened with chronic features. The cardio pericardial silhouette  is enlarged. Imaged bony structures of the thorax are intact.  Telemetry leads  overlie the chest.  IMPRESSION:  Very tiny right pleural effusion with interval improvement in  vascular congestion.  Electronically Signed  By: Misty Stanley M.D.  On: 04/04/2014 07:51   Assessment/Plan: S/P Procedure(s) (LRB): LEFT AND RIGHT HEART CATHETERIZATION WITH CORONARY ANGIOGRAM (N/A)  The patient and his family were counseled at length regarding the indications, risks and potential benefits of mitral valve repair or replacement.  The rationale for elective surgery has been explained, including a comparison between surgery and continued medical therapy.  The likelihood of successful and durable valve repair has been discussed with particular reference to the findings of their recent echocardiogram.  Based upon these findings and previous experience, I have quoted them a 57 percent likelihood of successful valve repair at best.  In the event that his valve cannot be successfully repaired, we discussed the possibility of replacing the mitral valve using a mechanical prosthesis with the attendant need for long-term anticoagulation versus the alternative of replacing it using a bioprosthetic tissue valve with its potential for late structural valve deterioration and failure, depending upon the patient's longevity.  The patient specifically requests that if the mitral valve must be replaced that it be done using a mechanical valve.   The patient understands and accepts all potential risks of surgery including but not limited to risk  of death, stroke or other neurologic complication, myocardial infarction, congestive heart failure, respiratory failure, renal failure, bleeding requiring transfusion and/or reexploration, arrhythmia, infection or other wound complications, pneumonia, pleural and/or pericardial effusion, pulmonary embolus, aortic dissection or other major vascular complication, or delayed complications related to valve repair or replacement including but not limited to structural valve  deterioration and failure, thrombosis, embolization, endocarditis, paravalvular leak, or recurrent/persistent endocarditis.  Alternative surgical approaches have been discussed including a comparison between conventional sternotomy and minimally-invasive techniques.  The relative risks and benefits of each have been reviewed as they pertain to the patient's specific circumstances, and all of their questions have been addressed.  Specific risks potentially related to the minimally-invasive approach were discussed at length, including but not limited to risk of conversion to full or partial sternotomy, aortic dissection or other major vascular complication, unilateral acute lung injury or pulmonary edema, phrenic nerve dysfunction or paralysis, rib fracture, chronic pain, lung hernia, or lymphocele.  All of their questions have been answered.  For OR tomorrow.   I spent in excess of 30 minutes during the conduct of this hospital encounter and >50% of this time involved direct face-to-face encounter with the patient for counseling and/or coordination of their care.    Ohm Dentler H 04/04/2014 6:15 PM

## 2014-04-04 NOTE — Progress Notes (Signed)
Patient ID: Timothy Palmer, male   DOB: 11-25-1965, 48 y.o.   MRN: [id number]  [name] Progress Note   Assessment/ Plan:   1 MV endocarditis / MSSA bacteremia- IV Ancef- for 6 weeks, he has been scheduled for mitral valve replacement tomorrow. Repeat blood cultures negative while on Ancef. ID following. Suspected infection source at this time is his buttonhole cannulation sites of left radiocephalic fistula 2 Septic embolus + mycotic aneurysm LLE- s/p fem-pop BPG 9/18 -clinical with improved perfusion to the distal extremity and the patient reports no claudication and can bear weight/walk without problems (except for knee stiffness) 3 ESRD on HD - he is usually on an outpatient schedule of TTS. He will be dialyzed today anticipating surgery tomorrow  4 HTN blood pressures fairly controlled on hydralazine and anticipate will improve further with hemodialysis/ultrafiltration  5 Anemia hgb 9.2 aranesp at 40/wk-will most likely increase this postoperatively anticipating resistance 6 HPTH cont sensipar, renvela, vit D on hold d/t ^Ca   Subjective:   Reports to be feeling well and somewhat anxious about surgery tomorrow-able to bear weight on his left leg and expresses some knee stiffness    Objective:   BP 138/92  Pulse 89  Temp(Src) 98.2 F (36.8 C) (Oral)  Resp 20  Ht 5\' 11"  (1.803 m)  Wt 99.156 kg (218 lb 9.6 oz)  BMI 30.50 kg/m2  SpO2 97%  Physical Exam: Gen: Comfortably resting in bed, watching television CVS: Pulse regular in rate and rhythm, 2/6 holosystolic murmur over apex Resp: Clear to auscultation bilaterally, no rales/rhonchi Abd: Soft, flat, nontender Ext: Trace left lower extremity edema, no right lower extremity edema. Improving surgical wounds left leg. Left radiocephalic fistula with well-formed buttonhole cannulation zones   Labs: BMET  Recent Labs Lab 03/29/14 0226 03/30/14 0312 03/31/14 0713 04/01/14 0405 04/02/14 0445 04/02/14 0716  04/04/14 0415  NA 138 138 136* 140 139 139 138  K 4.2 4.0 3.9 4.4 4.1 4.2 4.4  CL 96 94* 95* 95* 95* 96 95*  CO2 26 29 26 28 26 23 26   GLUCOSE 94 96 101* 98 93 94 90  BUN 22 21 34* 23 33* 31* 32*  CREATININE 7.27* 5.91* 9.03* 6.80* 9.56* 9.30* 10.00*  CALCIUM 10.3 10.9* 11.0* 10.9* 11.3* 10.5 10.6*  PHOS 4.7*  --  6.4* 5.0* 7.3* 6.3*  --    CBC  Recent Labs Lab 04/01/14 0405 04/02/14 0445 04/02/14 0716 04/04/14 0415  WBC 12.1* 11.1* 10.5 12.4*  HGB 9.8* 9.8* 9.2* 9.7*  HCT 30.4* 30.5* 28.6* 30.0*  MCV 92.7 95.3 91.7 93.2  PLT 328 300 301 299   Medications:    .  ceFAZolin (ANCEF) IV  2 g Intravenous Q T,Th,Sat-1800  . cinacalcet  60 mg Oral QPC supper  . darbepoetin (ARANESP) injection - DIALYSIS  40 mcg Intravenous Q Thu-HD  . feeding supplement (NEPRO CARB STEADY)  237 mL Oral Q24H  . feeding supplement (RESOURCE BREEZE)  1 Container Oral Q24H  . hydrALAZINE  25 mg Oral 3 times per day  . sevelamer carbonate  800 mg Oral TID WC  . sodium chloride  3 mL Intravenous Q12H   Elmarie Shiley, MD 04/04/2014, 9:32 AM

## 2014-04-05 ENCOUNTER — Inpatient Hospital Stay (HOSPITAL_COMMUNITY): Payer: Medicare Other | Admitting: Anesthesiology

## 2014-04-05 ENCOUNTER — Inpatient Hospital Stay (HOSPITAL_COMMUNITY): Payer: Medicare Other

## 2014-04-05 ENCOUNTER — Encounter (HOSPITAL_COMMUNITY): Payer: Self-pay | Admitting: Thoracic Surgery (Cardiothoracic Vascular Surgery)

## 2014-04-05 ENCOUNTER — Encounter (HOSPITAL_COMMUNITY)
Admission: EM | Disposition: A | Discharge status: Home / Self Care | Payer: Medicare Other | Source: Home / Self Care | Attending: Internal Medicine

## 2014-04-05 ENCOUNTER — Encounter (HOSPITAL_COMMUNITY): Payer: Medicare Other | Admitting: Anesthesiology

## 2014-04-05 DIAGNOSIS — I059 Rheumatic mitral valve disease, unspecified: Secondary | ICD-10-CM

## 2014-04-05 DIAGNOSIS — Z9889 Other specified postprocedural states: Secondary | ICD-10-CM

## 2014-04-05 HISTORY — PX: INTRAOPERATIVE TRANSESOPHAGEAL ECHOCARDIOGRAM: SHX5062

## 2014-04-05 HISTORY — PX: MITRAL VALVE REPAIR: SHX2039

## 2014-04-05 HISTORY — DX: Other specified postprocedural states: Z98.890

## 2014-04-05 HISTORY — PX: MITRAL VALVE REPLACEMENT: SHX147

## 2014-04-05 LAB — POCT I-STAT, CHEM 8
BUN: 18 mg/dL (ref 6–23)
BUN: 19 mg/dL (ref 6–23)
BUN: 18 mg/dL (ref 6–23)
BUN: 21 mg/dL (ref 6–23)
BUN: 20 mg/dL (ref 6–23)
BUN: 20 mg/dL (ref 6–23)
BUN: 23 mg/dL (ref 6–23)
CALCIUM ION: 1.38 mmol/L — AB (ref 1.12–1.23)
CALCIUM ION: 1.52 mmol/L — AB (ref 1.12–1.23)
CHLORIDE: 104 meq/L (ref 96–112)
CHLORIDE: 102 meq/L (ref 96–112)
CHLORIDE: 97 meq/L (ref 96–112)
CHLORIDE: 105 meq/L (ref 96–112)
CHLORIDE: 106 meq/L (ref 96–112)
CREATININE: 7.5 mg/dL — AB (ref 0.50–1.35)
CREATININE: 7.2 mg/dL — AB (ref 0.50–1.35)
CREATININE: 6.5 mg/dL — AB (ref 0.50–1.35)
Calcium, Ion: 1.36 mmol/L — ABNORMAL HIGH (ref 1.12–1.23)
Calcium, Ion: 1.38 mmol/L — ABNORMAL HIGH (ref 1.12–1.23)
Calcium, Ion: 1.22 mmol/L (ref 1.12–1.23)
Calcium, Ion: 1.26 mmol/L — ABNORMAL HIGH (ref 1.12–1.23)
Calcium, Ion: 1.4 mmol/L — ABNORMAL HIGH (ref 1.12–1.23)
Chloride: 101 mEq/L (ref 96–112)
Chloride: 104 mEq/L (ref 96–112)
Creatinine, Ser: 7.8 mg/dL — ABNORMAL HIGH (ref 0.50–1.35)
Creatinine, Ser: 6.4 mg/dL — ABNORMAL HIGH (ref 0.50–1.35)
Creatinine, Ser: 6.7 mg/dL — ABNORMAL HIGH (ref 0.50–1.35)
Creatinine, Ser: 7.9 mg/dL — ABNORMAL HIGH (ref 0.50–1.35)
GLUCOSE: 114 mg/dL — AB (ref 70–99)
GLUCOSE: 202 mg/dL — AB (ref 70–99)
GLUCOSE: 112 mg/dL — AB (ref 70–99)
Glucose, Bld: 120 mg/dL — ABNORMAL HIGH (ref 70–99)
Glucose, Bld: 103 mg/dL — ABNORMAL HIGH (ref 70–99)
Glucose, Bld: 172 mg/dL — ABNORMAL HIGH (ref 70–99)
Glucose, Bld: 225 mg/dL — ABNORMAL HIGH (ref 70–99)
HCT: 27 % — ABNORMAL LOW (ref 39.0–52.0)
HCT: 24 % — ABNORMAL LOW (ref 39.0–52.0)
HCT: 25 % — ABNORMAL LOW (ref 39.0–52.0)
HCT: 28 % — ABNORMAL LOW (ref 39.0–52.0)
HCT: 26 % — ABNORMAL LOW (ref 39.0–52.0)
HEMATOCRIT: 27 % — AB (ref 39.0–52.0)
HEMATOCRIT: 27 % — AB (ref 39.0–52.0)
HEMOGLOBIN: 8.5 g/dL — AB (ref 13.0–17.0)
HEMOGLOBIN: 8.8 g/dL — AB (ref 13.0–17.0)
Hemoglobin: 9.2 g/dL — ABNORMAL LOW (ref 13.0–17.0)
Hemoglobin: 9.2 g/dL — ABNORMAL LOW (ref 13.0–17.0)
Hemoglobin: 8.2 g/dL — ABNORMAL LOW (ref 13.0–17.0)
Hemoglobin: 9.2 g/dL — ABNORMAL LOW (ref 13.0–17.0)
Hemoglobin: 9.5 g/dL — ABNORMAL LOW (ref 13.0–17.0)
POTASSIUM: 3.9 meq/L (ref 3.7–5.3)
POTASSIUM: 3.4 meq/L — AB (ref 3.7–5.3)
Potassium: 3.9 mEq/L (ref 3.7–5.3)
Potassium: 4 mEq/L (ref 3.7–5.3)
Potassium: 3.7 mEq/L (ref 3.7–5.3)
Potassium: 4.1 mEq/L (ref 3.7–5.3)
Potassium: 4.3 mEq/L (ref 3.7–5.3)
SODIUM: 134 meq/L — AB (ref 137–147)
SODIUM: 135 meq/L — AB (ref 137–147)
SODIUM: 135 meq/L — AB (ref 137–147)
Sodium: 130 mEq/L — ABNORMAL LOW (ref 137–147)
Sodium: 131 mEq/L — ABNORMAL LOW (ref 137–147)
Sodium: 135 mEq/L — ABNORMAL LOW (ref 137–147)
Sodium: 133 mEq/L — ABNORMAL LOW (ref 137–147)
TCO2: 33 mmol/L (ref 0–100)
TCO2: 30 mmol/L (ref 0–100)
TCO2: 27 mmol/L (ref 0–100)
TCO2: 24 mmol/L (ref 0–100)
TCO2: 20 mmol/L (ref 0–100)
TCO2: 20 mmol/L (ref 0–100)
TCO2: 19 mmol/L (ref 0–100)

## 2014-04-05 LAB — CBC
HCT: 30.9 % — ABNORMAL LOW (ref 39.0–52.0)
HCT: 25.7 % — ABNORMAL LOW (ref 39.0–52.0)
HEMATOCRIT: 27.3 % — AB (ref 39.0–52.0)
HEMATOCRIT: 23.2 % — AB (ref 39.0–52.0)
HEMOGLOBIN: 9.9 g/dL — AB (ref 13.0–17.0)
HEMOGLOBIN: 8.6 g/dL — AB (ref 13.0–17.0)
HEMOGLOBIN: 7.7 g/dL — AB (ref 13.0–17.0)
Hemoglobin: 8.9 g/dL — ABNORMAL LOW (ref 13.0–17.0)
MCH: 30.1 pg (ref 26.0–34.0)
MCH: 31.3 pg (ref 26.0–34.0)
MCH: 30.5 pg (ref 26.0–34.0)
MCH: 30.3 pg (ref 26.0–34.0)
MCHC: 32 g/dL (ref 30.0–36.0)
MCHC: 32.6 g/dL (ref 30.0–36.0)
MCHC: 33.5 g/dL (ref 30.0–36.0)
MCHC: 33.2 g/dL (ref 30.0–36.0)
MCV: 93.9 fL (ref 78.0–100.0)
MCV: 96.1 fL (ref 78.0–100.0)
MCV: 91.1 fL (ref 78.0–100.0)
MCV: 91.3 fL (ref 78.0–100.0)
Platelets: 252 10*3/uL (ref 150–400)
Platelets: 222 10*3/uL (ref 150–400)
Platelets: 189 10*3/uL (ref 150–400)
Platelets: 165 10*3/uL (ref 150–400)
RBC: 3.29 MIL/uL — AB (ref 4.22–5.81)
RBC: 2.84 MIL/uL — AB (ref 4.22–5.81)
RBC: 2.82 MIL/uL — AB (ref 4.22–5.81)
RBC: 2.54 MIL/uL — AB (ref 4.22–5.81)
RDW: 15.4 % (ref 11.5–15.5)
RDW: 15.2 % (ref 11.5–15.5)
RDW: 14.9 % (ref 11.5–15.5)
RDW: 15 % (ref 11.5–15.5)
WBC: 10.2 10*3/uL (ref 4.0–10.5)
WBC: 36.1 10*3/uL — AB (ref 4.0–10.5)
WBC: 28.5 10*3/uL — AB (ref 4.0–10.5)
WBC: 25.2 10*3/uL — AB (ref 4.0–10.5)

## 2014-04-05 LAB — POCT I-STAT GLUCOSE
GLUCOSE: 320 mg/dL — AB (ref 70–99)
Glucose, Bld: 170 mg/dL — ABNORMAL HIGH (ref 70–99)
Operator id: 3406
Operator id: 3293

## 2014-04-05 LAB — POCT I-STAT 3, ART BLOOD GAS (G3+)
ACID-BASE DEFICIT: 2 mmol/L (ref 0.0–2.0)
Acid-Base Excess: 4 mmol/L — ABNORMAL HIGH (ref 0.0–2.0)
Acid-base deficit: 7 mmol/L — ABNORMAL HIGH (ref 0.0–2.0)
Acid-base deficit: 4 mmol/L — ABNORMAL HIGH (ref 0.0–2.0)
Acid-base deficit: 2 mmol/L (ref 0.0–2.0)
BICARBONATE: 29.9 meq/L — AB (ref 20.0–24.0)
BICARBONATE: 22.6 meq/L (ref 20.0–24.0)
Bicarbonate: 20.3 mEq/L (ref 20.0–24.0)
Bicarbonate: 23.5 mEq/L (ref 20.0–24.0)
Bicarbonate: 21.4 mEq/L (ref 20.0–24.0)
O2 SAT: 92 %
O2 SAT: 84 %
O2 Saturation: 100 %
O2 Saturation: 96 %
O2 Saturation: 99 %
PCO2 ART: 49.6 mmHg — AB (ref 35.0–45.0)
PCO2 ART: 49.4 mmHg — AB (ref 35.0–45.0)
PH ART: 7.396 (ref 7.350–7.450)
PH ART: 7.456 — AB (ref 7.350–7.450)
PO2 ART: 78 mmHg — AB (ref 80.0–100.0)
PO2 ART: 119 mmHg — AB (ref 80.0–100.0)
TCO2: 31 mmol/L (ref 0–100)
TCO2: 22 mmol/L (ref 0–100)
TCO2: 25 mmol/L (ref 0–100)
TCO2: 24 mmol/L (ref 0–100)
TCO2: 22 mmol/L (ref 0–100)
pCO2 arterial: 52.9 mmHg — ABNORMAL HIGH (ref 35.0–45.0)
pCO2 arterial: 36.4 mmHg (ref 35.0–45.0)
pCO2 arterial: 30.3 mmHg — ABNORMAL LOW (ref 35.0–45.0)
pH, Arterial: 7.388 (ref 7.350–7.450)
pH, Arterial: 7.221 — ABNORMAL LOW (ref 7.350–7.450)
pH, Arterial: 7.249 — ABNORMAL LOW (ref 7.350–7.450)
pO2, Arterial: 358 mmHg — ABNORMAL HIGH (ref 80.0–100.0)
pO2, Arterial: 77 mmHg — ABNORMAL LOW (ref 80.0–100.0)
pO2, Arterial: 55 mmHg — ABNORMAL LOW (ref 80.0–100.0)

## 2014-04-05 LAB — CREATININE, SERUM
Creatinine, Ser: 6.99 mg/dL — ABNORMAL HIGH (ref 0.50–1.35)
GFR, EST AFRICAN AMERICAN: 10 mL/min — AB (ref >90)
GFR, EST NON AFRICAN AMERICAN: 8 mL/min — AB (ref >90)

## 2014-04-05 LAB — GRAM STAIN: Gram Stain: NONE SEEN

## 2014-04-05 LAB — APTT
APTT: 43 s — AB (ref 24–37)
aPTT: 42 seconds — ABNORMAL HIGH (ref 24–37)

## 2014-04-05 LAB — PLATELET COUNT: Platelets: 262 10*3/uL (ref 150–400)

## 2014-04-05 LAB — HEMOGLOBIN AND HEMATOCRIT, BLOOD
HCT: 25.6 % — ABNORMAL LOW (ref 39.0–52.0)
Hemoglobin: 8.3 g/dL — ABNORMAL LOW (ref 13.0–17.0)

## 2014-04-05 LAB — BASIC METABOLIC PANEL
Anion gap: 14 (ref 5–15)
BUN: 19 mg/dL (ref 6–23)
CO2: 29 meq/L (ref 19–32)
Calcium: 10.6 mg/dL — ABNORMAL HIGH (ref 8.4–10.5)
Chloride: 94 mEq/L — ABNORMAL LOW (ref 96–112)
Creatinine, Ser: 7.01 mg/dL — ABNORMAL HIGH (ref 0.50–1.35)
GFR calc Af Amer: 10 mL/min — ABNORMAL LOW (ref >90)
GFR, EST NON AFRICAN AMERICAN: 8 mL/min — AB (ref >90)
GLUCOSE: 89 mg/dL (ref 70–99)
POTASSIUM: 4.4 meq/L (ref 3.7–5.3)
SODIUM: 137 meq/L (ref 137–147)

## 2014-04-05 LAB — PREPARE RBC (CROSSMATCH)

## 2014-04-05 LAB — HEMOGLOBIN A1C
HEMOGLOBIN A1C: 6 % — AB (ref <5.7)
Mean Plasma Glucose: 126 mg/dL — ABNORMAL HIGH (ref <117)

## 2014-04-05 LAB — POCT I-STAT 4, (NA,K, GLUC, HGB,HCT)
GLUCOSE: 135 mg/dL — AB (ref 70–99)
HCT: 30 % — ABNORMAL LOW (ref 39.0–52.0)
HEMOGLOBIN: 10.2 g/dL — AB (ref 13.0–17.0)
Potassium: 4.4 mEq/L (ref 3.7–5.3)
SODIUM: 136 meq/L — AB (ref 137–147)

## 2014-04-05 LAB — PROTIME-INR
INR: 1.5 — ABNORMAL HIGH (ref 0.00–1.49)
INR: 1.33 (ref 0.00–1.49)
Prothrombin Time: 18.1 seconds — ABNORMAL HIGH (ref 11.6–15.2)
Prothrombin Time: 16.5 seconds — ABNORMAL HIGH (ref 11.6–15.2)

## 2014-04-05 LAB — FIBRINOGEN: FIBRINOGEN: 271 mg/dL (ref 204–475)

## 2014-04-05 LAB — MAGNESIUM: Magnesium: 2 mg/dL (ref 1.5–2.5)

## 2014-04-05 SURGERY — REPLACEMENT, MITRAL VALVE, MINIMALLY INVASIVE
Anesthesia: General | Site: Chest | Laterality: Right

## 2014-04-05 SURGERY — REPAIR, MITRAL VALVE, MINIMALLY INVASIVE
Anesthesia: General | Site: Chest | Laterality: Right

## 2014-04-05 MED ORDER — ROCURONIUM BROMIDE 100 MG/10ML IV SOLN
INTRAVENOUS | Status: DC | PRN
  Administered 2014-04-05: 70 mg via INTRAVENOUS
  Administered 2014-04-05: 30 mg via INTRAVENOUS

## 2014-04-05 MED ORDER — ACETAMINOPHEN 160 MG/5ML PO SOLN: 650.0000 mg | Freq: Once | ORAL | Status: AC

## 2014-04-05 MED ORDER — INSULIN REGULAR BOLUS VIA INFUSION
0.0000 [IU] | Freq: Three times a day (TID) | INTRAVENOUS | Status: DC
  Filled 2014-04-05: qty 10

## 2014-04-05 MED ORDER — LACTATED RINGERS IV SOLN
INTRAVENOUS | Status: DC | PRN
  Administered 2014-04-05: 07:00:00 via INTRAVENOUS

## 2014-04-05 MED ORDER — FENTANYL CITRATE 0.05 MG/ML IJ SOLN
INTRAMUSCULAR | Status: AC
  Filled 2014-04-05: qty 5

## 2014-04-05 MED ORDER — CHLORHEXIDINE GLUCONATE 0.12 % MT SOLN
OROMUCOSAL | Status: AC
  Administered 2014-04-05
  Filled 2014-04-05: qty 15

## 2014-04-05 MED ORDER — INSULIN REGULAR HUMAN 100 UNIT/ML IJ SOLN
INTRAMUSCULAR | Status: DC
  Administered 2014-04-05: 2.1 [IU]/h via INTRAVENOUS
  Filled 2014-04-05 (×2): qty 2.5

## 2014-04-05 MED ORDER — FAMOTIDINE IN NACL 20-0.9 MG/50ML-% IV SOLN
20.0000 mg | Freq: Two times a day (BID) | INTRAVENOUS | Status: AC
  Administered 2014-04-05 (×2): 20 mg via INTRAVENOUS
  Filled 2014-04-05: qty 50

## 2014-04-05 MED ORDER — FENTANYL CITRATE 0.05 MG/ML IJ SOLN
INTRAMUSCULAR | Status: DC | PRN
  Administered 2014-04-05 (×2): 100 ug via INTRAVENOUS
  Administered 2014-04-05 (×2): 50 ug via INTRAVENOUS
  Administered 2014-04-05 (×3): 100 ug via INTRAVENOUS
  Administered 2014-04-05 (×2): 50 ug via INTRAVENOUS
  Administered 2014-04-05: 150 ug via INTRAVENOUS
  Administered 2014-04-05: 50 ug via INTRAVENOUS
  Administered 2014-04-05: 100 ug via INTRAVENOUS

## 2014-04-05 MED ORDER — ASPIRIN 81 MG PO CHEW: 324.0000 mg | CHEWABLE_TABLET | Freq: Every day | ORAL | Status: DC

## 2014-04-05 MED ORDER — ROCURONIUM BROMIDE 50 MG/5ML IV SOLN
INTRAVENOUS | Status: AC
  Filled 2014-04-05: qty 1

## 2014-04-05 MED ORDER — AMIODARONE LOAD VIA INFUSION
INTRAVENOUS | Status: DC | PRN
  Administered 2014-04-05 (×2): 150 mg via INTRAVENOUS

## 2014-04-05 MED ORDER — GLUTARALDEHYDE 0.625% SOAKING SOLUTION
TOPICAL | Status: AC
  Administered 2014-04-05: 1 via TOPICAL
  Filled 2014-04-05: qty 50

## 2014-04-05 MED ORDER — LIDOCAINE HCL (CARDIAC) 20 MG/ML IV SOLN
INTRAVENOUS | Status: DC | PRN
  Administered 2014-04-05: 50 mg via INTRAVENOUS

## 2014-04-05 MED ORDER — ACETAMINOPHEN 500 MG PO TABS: 1000.0000 mg | ORAL_TABLET | Freq: Four times a day (QID) | ORAL | Status: DC

## 2014-04-05 MED ORDER — BISACODYL 10 MG RE SUPP: 10.0000 mg | Freq: Every day | RECTAL | Status: DC

## 2014-04-05 MED ORDER — SODIUM CHLORIDE 0.9 % IJ SOLN
INTRAMUSCULAR | Status: AC
  Filled 2014-04-05: qty 10

## 2014-04-05 MED ORDER — MIDAZOLAM HCL 10 MG/2ML IJ SOLN
INTRAMUSCULAR | Status: AC
  Filled 2014-04-05: qty 2

## 2014-04-05 MED ORDER — METOPROLOL TARTRATE 1 MG/ML IV SOLN: 2.5000 mg | INTRAVENOUS | Status: DC | PRN

## 2014-04-05 MED ORDER — CALCIUM CHLORIDE 10 % IV SOLN
INTRAVENOUS | Status: DC | PRN
  Administered 2014-04-05: 100 mg via INTRAVENOUS
  Administered 2014-04-05: 250 mg via INTRAVENOUS
  Administered 2014-04-05: 100 mg via INTRAVENOUS
  Administered 2014-04-05: 50 mg via INTRAVENOUS
  Administered 2014-04-05: 250 mg via INTRAVENOUS

## 2014-04-05 MED ORDER — ARTIFICIAL TEARS OP OINT
TOPICAL_OINTMENT | OPHTHALMIC | Status: AC
  Filled 2014-04-05: qty 3.5

## 2014-04-05 MED ORDER — ACETAMINOPHEN 160 MG/5ML PO SOLN: 1000.0000 mg | Freq: Four times a day (QID) | ORAL | Status: DC

## 2014-04-05 MED ORDER — SODIUM CHLORIDE 0.9 % IV SOLN: 250.0000 mL | INTRAVENOUS | Status: DC

## 2014-04-05 MED ORDER — VASOPRESSIN 20 UNIT/ML IJ SOLN
100.0000 [IU] | INTRAVENOUS | Status: DC | PRN
  Administered 2014-04-05: .03 [IU]/min via INTRAVENOUS

## 2014-04-05 MED ORDER — VASOPRESSIN 20 UNIT/ML IJ SOLN
0.0300 [IU]/min | INTRAVENOUS | Status: DC
  Administered 2014-04-06: 0.03 [IU]/min via INTRAVENOUS
  Filled 2014-04-05: qty 2

## 2014-04-05 MED ORDER — PHENYLEPHRINE HCL 10 MG/ML IJ SOLN
10.0000 mg | INTRAVENOUS | Status: DC | PRN
  Administered 2014-04-05: 50 ug/min via INTRAVENOUS

## 2014-04-05 MED ORDER — PHENYLEPHRINE HCL 10 MG/ML IJ SOLN
INTRAMUSCULAR | Status: AC
  Filled 2014-04-05: qty 1

## 2014-04-05 MED ORDER — SODIUM CHLORIDE 0.9 % IV SOLN: INTRAVENOUS | Status: DC

## 2014-04-05 MED ORDER — SODIUM CHLORIDE 0.9 % IV SOLN
INTRAVENOUS | Status: DC
  Administered 2014-04-05: 16:00:00 via INTRAVENOUS

## 2014-04-05 MED ORDER — SODIUM CHLORIDE 0.9 % IV SOLN
Freq: Once | INTRAVENOUS | Status: AC
  Administered 2014-04-05: 23:00:00 via INTRAVENOUS

## 2014-04-05 MED ORDER — PHENYLEPHRINE HCL 10 MG/ML IJ SOLN
INTRAMUSCULAR | Status: AC
  Filled 2014-04-05: qty 2

## 2014-04-05 MED ORDER — METOPROLOL TARTRATE 25 MG/10 ML ORAL SUSPENSION
12.5000 mg | Freq: Two times a day (BID) | ORAL | Status: DC
  Filled 2014-04-05: qty 5

## 2014-04-05 MED ORDER — PHENYLEPHRINE HCL 10 MG/ML IJ SOLN
0.0000 ug/min | INTRAVENOUS | Status: DC
  Filled 2014-04-05 (×2): qty 2

## 2014-04-05 MED ORDER — DEXMEDETOMIDINE HCL IN NACL 200 MCG/50ML IV SOLN
0.1000 ug/kg/h | INTRAVENOUS | Status: DC
  Filled 2014-04-05: qty 50

## 2014-04-05 MED ORDER — OXYCODONE HCL 5 MG PO TABS: 5.0000 mg | ORAL_TABLET | ORAL | Status: DC | PRN

## 2014-04-05 MED ORDER — MORPHINE SULFATE 2 MG/ML IJ SOLN
2.0000 mg | INTRAMUSCULAR | Status: DC | PRN
  Filled 2014-04-05: qty 1

## 2014-04-05 MED ORDER — DEXTROSE 5 % IV SOLN
0.0000 ug/min | INTRAVENOUS | Status: DC
  Administered 2014-04-05: 20 ug/min via INTRAVENOUS
  Filled 2014-04-05 (×3): qty 4

## 2014-04-05 MED ORDER — ASPIRIN EC 325 MG PO TBEC
325.0000 mg | DELAYED_RELEASE_TABLET | Freq: Every day | ORAL | Status: DC
  Filled 2014-04-05: qty 1

## 2014-04-05 MED ORDER — AMIODARONE HCL IN DEXTROSE 360-4.14 MG/200ML-% IV SOLN
60.0000 mg/h | INTRAVENOUS | Status: DC
  Filled 2014-04-05 (×2): qty 200

## 2014-04-05 MED ORDER — HEPARIN SODIUM (PORCINE) 1000 UNIT/ML IJ SOLN
INTRAMUSCULAR | Status: AC
  Filled 2014-04-05: qty 1

## 2014-04-05 MED ORDER — NOREPINEPHRINE BITARTRATE 1 MG/ML IV SOLN
0.0000 ug/min | INTRAVENOUS | Status: DC
  Administered 2014-04-05: 8 ug/min via INTRAVENOUS
  Filled 2014-04-05 (×3): qty 4

## 2014-04-05 MED ORDER — ACETAMINOPHEN 650 MG RE SUPP
650.0000 mg | Freq: Once | RECTAL | Status: AC
  Administered 2014-04-05: 650 mg via RECTAL

## 2014-04-05 MED ORDER — AMIODARONE HCL IN DEXTROSE 360-4.14 MG/200ML-% IV SOLN
INTRAVENOUS | Status: DC | PRN
  Administered 2014-04-05: 15:00:00 via INTRAVENOUS
  Administered 2014-04-05: 30 mg/h via INTRAVENOUS

## 2014-04-05 MED ORDER — MILRINONE IN DEXTROSE 20 MG/100ML IV SOLN
INTRAVENOUS | Status: DC | PRN
  Administered 2014-04-05: 0.375 ug/kg/min via INTRAVENOUS

## 2014-04-05 MED ORDER — NITROGLYCERIN IN D5W 200-5 MCG/ML-% IV SOLN: 0.0000 ug/min | INTRAVENOUS | Status: DC

## 2014-04-05 MED ORDER — NOREPINEPHRINE BITARTRATE 1 MG/ML IV SOLN
0.0000 ug/min | INTRAVENOUS | Status: DC
  Filled 2014-04-05: qty 8

## 2014-04-05 MED ORDER — SODIUM CHLORIDE 0.9 % IJ SOLN: 3.0000 mL | INTRAMUSCULAR | Status: DC | PRN

## 2014-04-05 MED ORDER — HEPARIN SODIUM (PORCINE) 1000 UNIT/ML IJ SOLN
INTRAMUSCULAR | Status: DC | PRN
  Administered 2014-04-05: 34000 [IU] via INTRAVENOUS

## 2014-04-05 MED ORDER — NOREPINEPHRINE BITARTRATE 1 MG/ML IV SOLN
0.0000 ug/min | INTRAVENOUS | Status: DC
  Administered 2014-04-05: 10 ug/min via INTRAVENOUS
  Filled 2014-04-05: qty 16

## 2014-04-05 MED ORDER — LACTATED RINGERS IV SOLN: INTRAVENOUS | Status: DC

## 2014-04-05 MED ORDER — PROPOFOL 10 MG/ML IV BOLUS
INTRAVENOUS | Status: AC
  Filled 2014-04-05: qty 20

## 2014-04-05 MED ORDER — AMIODARONE HCL IN DEXTROSE 360-4.14 MG/200ML-% IV SOLN
30.0000 mg/h | INTRAVENOUS | Status: DC
  Administered 2014-04-05: 30 mg/h via INTRAVENOUS
  Filled 2014-04-05 (×2): qty 200

## 2014-04-05 MED ORDER — ONDANSETRON HCL 4 MG/2ML IJ SOLN: 4.0000 mg | Freq: Four times a day (QID) | INTRAMUSCULAR | Status: DC | PRN

## 2014-04-05 MED ORDER — MORPHINE SULFATE 2 MG/ML IJ SOLN
1.0000 mg | INTRAMUSCULAR | Status: DC | PRN
  Administered 2014-04-05 (×3): 2 mg via INTRAVENOUS
  Filled 2014-04-05 (×2): qty 1

## 2014-04-05 MED ORDER — ALBUMIN HUMAN 5 % IV SOLN
INTRAVENOUS | Status: DC | PRN
  Administered 2014-04-05: 15:00:00 via INTRAVENOUS

## 2014-04-05 MED ORDER — CISATRACURIUM BESYLATE (PF) 10 MG/5ML IV SOLN
INTRAVENOUS | Status: DC | PRN
  Administered 2014-04-05: 8 mg via INTRAVENOUS
  Administered 2014-04-05 (×2): 6 mg via INTRAVENOUS

## 2014-04-05 MED ORDER — POTASSIUM CHLORIDE 10 MEQ/50ML IV SOLN: 10.0000 meq | INTRAVENOUS | Status: DC

## 2014-04-05 MED ORDER — PANTOPRAZOLE SODIUM 40 MG PO TBEC: 40.0000 mg | DELAYED_RELEASE_TABLET | Freq: Every day | ORAL | Status: DC

## 2014-04-05 MED ORDER — VANCOMYCIN HCL IN DEXTROSE 1-5 GM/200ML-% IV SOLN
1000.0000 mg | Freq: Once | INTRAVENOUS | Status: DC
  Filled 2014-04-05: qty 200

## 2014-04-05 MED ORDER — SODIUM CHLORIDE 0.9 % IR SOLN
Status: DC | PRN
  Administered 2014-04-05: 09:00:00

## 2014-04-05 MED ORDER — MAGNESIUM SULFATE 4000MG/100ML IJ SOLN: 4.0000 g | Freq: Once | INTRAMUSCULAR | Status: DC

## 2014-04-05 MED ORDER — SUCCINYLCHOLINE CHLORIDE 20 MG/ML IJ SOLN
INTRAMUSCULAR | Status: AC
  Filled 2014-04-05: qty 1

## 2014-04-05 MED ORDER — CISATRACURIUM BESYLATE 20 MG/10ML IV SOLN
INTRAVENOUS | Status: AC
  Filled 2014-04-05: qty 10

## 2014-04-05 MED ORDER — SODIUM CHLORIDE 0.9 % IV SOLN
Freq: Once | INTRAVENOUS | Status: AC
  Administered 2014-04-05: 1 mL via INTRAVENOUS

## 2014-04-05 MED ORDER — EPHEDRINE SULFATE 50 MG/ML IJ SOLN
INTRAMUSCULAR | Status: DC | PRN
  Administered 2014-04-05 (×2): 10 mg via INTRAVENOUS

## 2014-04-05 MED ORDER — EPHEDRINE SULFATE 50 MG/ML IJ SOLN
INTRAMUSCULAR | Status: AC
  Filled 2014-04-05: qty 1

## 2014-04-05 MED ORDER — PROTAMINE SULFATE 10 MG/ML IV SOLN
INTRAVENOUS | Status: DC | PRN
  Administered 2014-04-05: 30 mg via INTRAVENOUS
  Administered 2014-04-05: 10 mg via INTRAVENOUS
  Administered 2014-04-05 (×2): 30 mg via INTRAVENOUS
  Administered 2014-04-05: 20 mg via INTRAVENOUS
  Administered 2014-04-05: 30 mg via INTRAVENOUS
  Administered 2014-04-05 (×2): 50 mg via INTRAVENOUS
  Administered 2014-04-05: 20 mg via INTRAVENOUS
  Administered 2014-04-05: 10 mg via INTRAVENOUS
  Administered 2014-04-05 (×2): 30 mg via INTRAVENOUS
  Administered 2014-04-05: 10 mg via INTRAVENOUS

## 2014-04-05 MED ORDER — ALBUMIN HUMAN 5 % IV SOLN
250.0000 mL | INTRAVENOUS | Status: AC | PRN
  Administered 2014-04-05 (×4): 250 mL via INTRAVENOUS
  Filled 2014-04-05 (×2): qty 250

## 2014-04-05 MED ORDER — SODIUM CHLORIDE 0.45 % IV SOLN
INTRAVENOUS | Status: DC
  Administered 2014-04-05: 17:00:00 via INTRAVENOUS

## 2014-04-05 MED ORDER — PROPOFOL 10 MG/ML IV BOLUS
INTRAVENOUS | Status: DC | PRN
  Administered 2014-04-05: 100 mg via INTRAVENOUS

## 2014-04-05 MED ORDER — SODIUM CHLORIDE 0.9 % IR SOLN
Status: DC | PRN
  Administered 2014-04-05: 1000 mL

## 2014-04-05 MED ORDER — DEXTROSE 5 % IV SOLN
1.5000 g | Freq: Two times a day (BID) | INTRAVENOUS | Status: AC
  Administered 2014-04-05: 1.5 g via INTRAVENOUS
  Filled 2014-04-05: qty 1.5

## 2014-04-05 MED ORDER — METOPROLOL TARTRATE 12.5 MG HALF TABLET
12.5000 mg | ORAL_TABLET | Freq: Two times a day (BID) | ORAL | Status: DC
Start: 2014-04-05 — End: 2014-04-06
  Filled 2014-04-05 (×3): qty 1

## 2014-04-05 MED ORDER — DEXMEDETOMIDINE HCL IN NACL 400 MCG/100ML IV SOLN
0.4000 ug/kg/h | INTRAVENOUS | Status: DC
  Filled 2014-04-05: qty 100

## 2014-04-05 MED ORDER — MIDAZOLAM HCL 2 MG/2ML IJ SOLN: 2.0000 mg | INTRAMUSCULAR | Status: DC | PRN

## 2014-04-05 MED ORDER — LIDOCAINE HCL (CARDIAC) 20 MG/ML IV SOLN
INTRAVENOUS | Status: AC
  Filled 2014-04-05: qty 5

## 2014-04-05 MED ORDER — BISACODYL 5 MG PO TBEC: 10.0000 mg | DELAYED_RELEASE_TABLET | Freq: Every day | ORAL | Status: DC

## 2014-04-05 MED ORDER — MILRINONE IN DEXTROSE 20 MG/100ML IV SOLN
0.3000 ug/kg/min | INTRAVENOUS | Status: DC
  Filled 2014-04-05: qty 100

## 2014-04-05 MED ORDER — MIDAZOLAM HCL 5 MG/5ML IJ SOLN
INTRAMUSCULAR | Status: DC | PRN
  Administered 2014-04-05 (×2): 1 mg via INTRAVENOUS
  Administered 2014-04-05: 2 mg via INTRAVENOUS
  Administered 2014-04-05 (×3): 1 mg via INTRAVENOUS
  Administered 2014-04-05: 3 mg via INTRAVENOUS

## 2014-04-05 MED ORDER — SODIUM CHLORIDE 0.9 % IV SOLN
1.0000 g/h | Freq: Once | INTRAVENOUS | Status: DC
  Filled 2014-04-05: qty 20

## 2014-04-05 MED ORDER — DOCUSATE SODIUM 100 MG PO CAPS: 200.0000 mg | ORAL_CAPSULE | Freq: Every day | ORAL | Status: DC

## 2014-04-05 MED ORDER — SODIUM CHLORIDE 0.9 % IJ SOLN: 3.0000 mL | Freq: Two times a day (BID) | INTRAMUSCULAR | Status: DC

## 2014-04-05 SURGICAL SUPPLY — 117 items
ADAPTER CARDIO PERF ANTE/RETRO (ADAPTER) ×3 IMPLANT
APPLICATOR COTTON TIP 6IN STRL (MISCELLANEOUS) ×3 IMPLANT
BAG DECANTER FOR FLEXI CONT (MISCELLANEOUS) ×9 IMPLANT
BENZOIN TINCTURE PRP APPL 2/3 (GAUZE/BANDAGES/DRESSINGS) ×3 IMPLANT
BLADE SURG 11 STRL SS (BLADE) ×3 IMPLANT
CANISTER SUCTION 2500CC (MISCELLANEOUS) ×6 IMPLANT
CANNULA FEM VENOUS REMOTE 22FR (CANNULA) ×3 IMPLANT
CANNULA FEMORAL ART 14 SM (MISCELLANEOUS) ×3 IMPLANT
CANNULA GUNDRY RCSP 15FR (MISCELLANEOUS) ×3 IMPLANT
CANNULA OPTISITE PERFUSION 16F (CANNULA) IMPLANT
CANNULA OPTISITE PERFUSION 18F (CANNULA) ×3 IMPLANT
CARDIAC SUCTION (MISCELLANEOUS) ×3 IMPLANT
CATH KIT ON Q 5IN SLV (PAIN MANAGEMENT) IMPLANT
CONN ST 1/4X3/8  BEN (MISCELLANEOUS) ×2
CONN ST 1/4X3/8 BEN (MISCELLANEOUS) ×4 IMPLANT
CONT SPEC STER OR (MISCELLANEOUS) ×3 IMPLANT
COVER BACK TABLE 24X17X13 BIG (DRAPES) ×3 IMPLANT
COVER MAYO STAND STRL (DRAPES) ×3 IMPLANT
COVER PROBE W GEL 5X96 (DRAPES) ×3 IMPLANT
COVER SURGICAL LIGHT HANDLE (MISCELLANEOUS) ×3 IMPLANT
CRADLE DONUT ADULT HEAD (MISCELLANEOUS) ×3 IMPLANT
DERMABOND ADVANCED (GAUZE/BANDAGES/DRESSINGS) ×1
DERMABOND ADVANCED .7 DNX12 (GAUZE/BANDAGES/DRESSINGS) ×2 IMPLANT
DEVICE PMI PUNCTURE CLOSURE (MISCELLANEOUS) ×3 IMPLANT
DEVICE SUT CK QUICK LOAD MINI (Prosthesis & Implant Heart) ×12 IMPLANT
DEVICE TROCAR PUNCTURE CLOSURE (ENDOMECHANICALS) ×3 IMPLANT
DRAIN CHANNEL 28F RND 3/8 FF (WOUND CARE) ×6 IMPLANT
DRAPE BILATERAL SPLIT (DRAPES) ×3 IMPLANT
DRAPE C-ARM 42X72 X-RAY (DRAPES) ×3 IMPLANT
DRAPE CV SPLIT W-CLR ANES SCRN (DRAPES) ×3 IMPLANT
DRAPE INCISE IOBAN 66X45 STRL (DRAPES) ×9 IMPLANT
DRAPE PROXIMA HALF (DRAPES) ×3 IMPLANT
DRAPE SLUSH/WARMER DISC (DRAPES) ×3 IMPLANT
DRSG COVADERM 4X8 (GAUZE/BANDAGES/DRESSINGS) ×3 IMPLANT
ELECT BLADE 6.5 EXT (BLADE) ×3 IMPLANT
ELECT REM PT RETURN 9FT ADLT (ELECTROSURGICAL) ×6
ELECTRODE REM PT RTRN 9FT ADLT (ELECTROSURGICAL) ×4 IMPLANT
FEMORAL VENOUS CANN RAP (CANNULA) IMPLANT
GAUZE SPONGE 4X4 12PLY STRL (GAUZE/BANDAGES/DRESSINGS) ×3 IMPLANT
GLOVE BIO SURGEON STRL SZ 6 (GLOVE) ×18 IMPLANT
GLOVE BIO SURGEON STRL SZ 6.5 (GLOVE) ×12 IMPLANT
GLOVE BIOGEL PI IND STRL 6 (GLOVE) ×8 IMPLANT
GLOVE BIOGEL PI IND STRL 6.5 (GLOVE) ×10 IMPLANT
GLOVE BIOGEL PI IND STRL 7.0 (GLOVE) ×2 IMPLANT
GLOVE BIOGEL PI INDICATOR 6 (GLOVE) ×4
GLOVE BIOGEL PI INDICATOR 6.5 (GLOVE) ×5
GLOVE BIOGEL PI INDICATOR 7.0 (GLOVE) ×1
GLOVE ORTHO TXT STRL SZ7.5 (GLOVE) ×9 IMPLANT
GOWN STRL REUS W/ TWL LRG LVL3 (GOWN DISPOSABLE) ×16 IMPLANT
GOWN STRL REUS W/TWL LRG LVL3 (GOWN DISPOSABLE) ×8
GRAFT PTCH CORMATRIX 7X10 4PLY (Prosthesis & Implant Heart) ×3 IMPLANT
GUIDEWIRE ANG ZIPWIRE 038X150 (WIRE) IMPLANT
INSERT CONFORM CROSS CLAMP 66M (MISCELLANEOUS) IMPLANT
INSERT CONFORM CROSS CLAMP 86M (MISCELLANEOUS) IMPLANT
IV NS 1000ML (IV SOLUTION) ×2
IV NS 1000ML BAXH (IV SOLUTION) ×4 IMPLANT
KIT BASIN OR (CUSTOM PROCEDURE TRAY) ×3 IMPLANT
KIT DEVICE SUT COR-KNOT MIS 5 (INSTRUMENTS) ×3 IMPLANT
KIT DILATOR VASC 18G NDL (KITS) ×3 IMPLANT
KIT DRAINAGE VACCUM ASSIST (KITS) ×3 IMPLANT
KIT ROOM TURNOVER OR (KITS) ×3 IMPLANT
KIT SUCTION CATH 14FR (SUCTIONS) ×3 IMPLANT
LEAD PACING MYOCARDI (MISCELLANEOUS) ×3 IMPLANT
LINE VENT (MISCELLANEOUS) ×3 IMPLANT
NEEDLE AORTIC ROOT 14G 7F (CATHETERS) ×3 IMPLANT
NS IRRIG 1000ML POUR BTL (IV SOLUTION) ×15 IMPLANT
PACK OPEN HEART (CUSTOM PROCEDURE TRAY) ×3 IMPLANT
PAD ARMBOARD 7.5X6 YLW CONV (MISCELLANEOUS) ×6 IMPLANT
PAD ELECT DEFIB RADIOL ZOLL (MISCELLANEOUS) ×3 IMPLANT
RETRACTOR PVM SOFT TISSUE M (INSTRUMENTS) ×3 IMPLANT
RETRACTOR TRL SOFT TISSUE LG (INSTRUMENTS) ×3 IMPLANT
RETRACTOR TRM SOFT TISSUE 7.5 (INSTRUMENTS) IMPLANT
RING MITRAL MEMO 3D 28MM SMD28 (Prosthesis & Implant Heart) ×3 IMPLANT
SET CANNULATION TOURNIQUET (MISCELLANEOUS) ×3 IMPLANT
SET CARDIOPLEGIA MPS 5001102 (MISCELLANEOUS) ×3 IMPLANT
SET IRRIG TUBING LAPAROSCOPIC (IRRIGATION / IRRIGATOR) ×3 IMPLANT
SOLUTION ANTI FOG 6CC (MISCELLANEOUS) ×3 IMPLANT
SPONGE GAUZE 4X4 12PLY STER LF (GAUZE/BANDAGES/DRESSINGS) ×3 IMPLANT
STOPCOCK 4 WAY LG BORE MALE ST (IV SETS) ×3 IMPLANT
SUCKER WEIGHTED FLEX (MISCELLANEOUS) ×6 IMPLANT
SUT BONE WAX W31G (SUTURE) ×3 IMPLANT
SUT E-PACK MINIMALLY INVASIVE (SUTURE) ×3 IMPLANT
SUT ETHIBOND (SUTURE) ×12 IMPLANT
SUT ETHIBOND 2 0 V4 (SUTURE) ×3 IMPLANT
SUT ETHIBOND 2 0V4 GREEN (SUTURE) ×3 IMPLANT
SUT ETHIBOND 2-0 RB-1 WHT (SUTURE) ×9 IMPLANT
SUT ETHIBOND X763 2 0 SH 1 (SUTURE) ×3 IMPLANT
SUT GORETEX CV 4 TH 22 36 (SUTURE) ×3 IMPLANT
SUT GORETEX CV4 TH-18 (SUTURE) ×6 IMPLANT
SUT MNCRL AB 3-0 PS2 18 (SUTURE) IMPLANT
SUT PROLENE 3 0 SH1 36 (SUTURE) ×12 IMPLANT
SUT PROLENE 4 0 RB 1 (SUTURE) ×8
SUT PROLENE 4-0 RB1 .5 CRCL 36 (SUTURE) ×16 IMPLANT
SUT PROLENE 6 0 C 1 30 (SUTURE) ×3 IMPLANT
SUT SILK  1 MH (SUTURE) ×1
SUT SILK 1 MH (SUTURE) ×2 IMPLANT
SUT SILK 2 0 SH CR/8 (SUTURE) ×3 IMPLANT
SUT SILK 3 0 SH CR/8 (SUTURE) IMPLANT
SUT VIC AB 2-0 CTX 36 (SUTURE) IMPLANT
SUT VIC AB 3-0 SH 18 (SUTURE) ×3 IMPLANT
SUT VIC AB 3-0 SH 8-18 (SUTURE) IMPLANT
SUT VICRYL 2 TP 1 (SUTURE) IMPLANT
SWAB COLLECTION DEVICE MRSA (MISCELLANEOUS) ×6 IMPLANT
SYRINGE 10CC LL (SYRINGE) ×6 IMPLANT
SYSTEM SAHARA CHEST DRAIN ATS (WOUND CARE) ×3 IMPLANT
TOWEL OR 17X24 6PK STRL BLUE (TOWEL DISPOSABLE) ×6 IMPLANT
TOWEL OR 17X26 10 PK STRL BLUE (TOWEL DISPOSABLE) ×6 IMPLANT
TRAY CATH LUMEN 1 20CM STRL (SET/KITS/TRAYS/PACK) ×3 IMPLANT
TRAY FOLEY IC TEMP SENS 16FR (CATHETERS) ×3 IMPLANT
TROCAR XCEL BLADELESS 5X75MML (TROCAR) ×3 IMPLANT
TROCAR XCEL NON-BLD 11X100MML (ENDOMECHANICALS) ×6 IMPLANT
TUBE ANAEROBIC SPECIMEN COL (MISCELLANEOUS) ×3 IMPLANT
TUBING ART PRESS 48 MALE/FEM (TUBING) ×6 IMPLANT
TUNNELER SHEATH ON-Q 11GX8 DSP (PAIN MANAGEMENT) IMPLANT
UNDERPAD 30X30 INCONTINENT (UNDERPADS AND DIAPERS) ×3 IMPLANT
WATER STERILE IRR 1000ML POUR (IV SOLUTION) ×6 IMPLANT
WIRE BENTSON .035X145CM (WIRE) ×3 IMPLANT

## 2014-04-05 SURGICAL SUPPLY — 84 items
ADAPTER CARDIO PERF ANTE/RETRO (ADAPTER) ×2 IMPLANT
ATTRACTOMAT 16X20 MAGNETIC DRP (DRAPES) ×2 IMPLANT
BAG DECANTER FOR FLEXI CONT (MISCELLANEOUS) ×2 IMPLANT
BENZOIN TINCTURE PRP APPL 2/3 (GAUZE/BANDAGES/DRESSINGS) ×2 IMPLANT
BLADE SURG 11 STRL SS (BLADE) ×2 IMPLANT
CANISTER SUCTION 2500CC (MISCELLANEOUS) ×4 IMPLANT
CANNULA FEM VENOUS REMOTE 22FR (CANNULA) IMPLANT
CANNULA FEMORAL ART 14 SM (MISCELLANEOUS) ×2 IMPLANT
CANNULA GUNDRY RCSP 15FR (MISCELLANEOUS) ×2 IMPLANT
CANNULA OPTISITE PERFUSION 16F (CANNULA) IMPLANT
CANNULA OPTISITE PERFUSION 18F (CANNULA) IMPLANT
CONN ST 1/4X3/8  BEN (MISCELLANEOUS) ×2
CONN ST 1/4X3/8 BEN (MISCELLANEOUS) ×2 IMPLANT
CONT SPEC STER OR (MISCELLANEOUS) ×2 IMPLANT
COVER BACK TABLE 24X17X13 BIG (DRAPES) ×2 IMPLANT
COVER MAYO STAND STRL (DRAPES) ×4 IMPLANT
COVER SURGICAL LIGHT HANDLE (MISCELLANEOUS) ×2 IMPLANT
CRADLE DONUT ADULT HEAD (MISCELLANEOUS) ×2 IMPLANT
DERMABOND ADVANCED (GAUZE/BANDAGES/DRESSINGS) ×3
DERMABOND ADVANCED .7 DNX12 (GAUZE/BANDAGES/DRESSINGS) ×3 IMPLANT
DEVICE TROCAR PUNCTURE CLOSURE (ENDOMECHANICALS) ×2 IMPLANT
DRAIN CHANNEL 28F RND 3/8 FF (WOUND CARE) ×4 IMPLANT
DRAPE BILATERAL SPLIT (DRAPES) ×2 IMPLANT
DRAPE C-ARM 42X72 X-RAY (DRAPES) ×2 IMPLANT
DRAPE CV SPLIT W-CLR ANES SCRN (DRAPES) ×2 IMPLANT
DRAPE INCISE IOBAN 66X45 STRL (DRAPES) ×4 IMPLANT
DRAPE SLUSH/WARMER DISC (DRAPES) ×2 IMPLANT
DRSG COVADERM 4X8 (GAUZE/BANDAGES/DRESSINGS) ×2 IMPLANT
ELECT BLADE 6.5 EXT (BLADE) ×4 IMPLANT
ELECT REM PT RETURN 9FT ADLT (ELECTROSURGICAL) ×4
ELECTRODE REM PT RTRN 9FT ADLT (ELECTROSURGICAL) ×2 IMPLANT
FEMORAL VENOUS CANN RAP (CANNULA) IMPLANT
GAUZE SPONGE 4X4 12PLY STRL (GAUZE/BANDAGES/DRESSINGS) ×2 IMPLANT
GLOVE ORTHO TXT STRL SZ7.5 (GLOVE) ×8 IMPLANT
GOWN STRL REUS W/ TWL LRG LVL3 (GOWN DISPOSABLE) ×4 IMPLANT
GOWN STRL REUS W/TWL LRG LVL3 (GOWN DISPOSABLE) ×4
GUIDEWIRE ANG ZIPWIRE 038X150 (WIRE) ×2 IMPLANT
INSERT CONFORM CROSS CLAMP 66M (MISCELLANEOUS) IMPLANT
INSERT CONFORM CROSS CLAMP 86M (MISCELLANEOUS) IMPLANT
INSERT FOGARTY XLG (MISCELLANEOUS) ×2 IMPLANT
KIT BASIN OR (CUSTOM PROCEDURE TRAY) ×2 IMPLANT
KIT DEVICE SUT COR-KNOT MIS 5 (INSTRUMENTS) IMPLANT
KIT DILATOR VASC 18G NDL (KITS) ×2 IMPLANT
KIT ROOM TURNOVER OR (KITS) ×2 IMPLANT
KIT SUCTION CATH 14FR (SUCTIONS) ×4 IMPLANT
LEAD PACING MYOCARDI (MISCELLANEOUS) ×2 IMPLANT
NEEDLE AORTIC ROOT 14G 7F (CATHETERS) ×2 IMPLANT
NS IRRIG 1000ML POUR BTL (IV SOLUTION) ×10 IMPLANT
PACK OPEN HEART (CUSTOM PROCEDURE TRAY) ×2 IMPLANT
PAD ARMBOARD 7.5X6 YLW CONV (MISCELLANEOUS) ×4 IMPLANT
PAD ELECT DEFIB RADIOL ZOLL (MISCELLANEOUS) ×2 IMPLANT
RETRACTOR TRL SOFT TISSUE LG (INSTRUMENTS) IMPLANT
RETRACTOR TRM SOFT TISSUE 7.5 (INSTRUMENTS) IMPLANT
SET CANNULATION TOURNIQUET (MISCELLANEOUS) IMPLANT
SET IRRIG TUBING LAPAROSCOPIC (IRRIGATION / IRRIGATOR) ×4 IMPLANT
SOLUTION ANTI FOG 6CC (MISCELLANEOUS) IMPLANT
SPONGE GAUZE 4X4 12PLY STER LF (GAUZE/BANDAGES/DRESSINGS) ×2 IMPLANT
SUCKER WEIGHTED FLEX (MISCELLANEOUS) IMPLANT
SUT BONE WAX W31G (SUTURE) ×2 IMPLANT
SUT E-PACK MINIMALLY INVASIVE (SUTURE) ×2 IMPLANT
SUT ETHIBOND X763 2 0 SH 1 (SUTURE) ×6 IMPLANT
SUT GORETEX CV 4 TH 22 36 (SUTURE) ×2 IMPLANT
SUT GORETEX CV-5THC-13 36IN (SUTURE) ×6 IMPLANT
SUT GORETEX CV4 TH-18 (SUTURE) ×4 IMPLANT
SUT PROLENE 3 0 SH1 36 (SUTURE) ×6 IMPLANT
SUT PROLENE 4 0 RB 1 (SUTURE) ×2
SUT PROLENE 4-0 RB1 .5 CRCL 36 (SUTURE) ×2 IMPLANT
SUT VIC AB 2-0 CT1 27 (SUTURE) ×1
SUT VIC AB 2-0 CT1 36 (SUTURE) ×2 IMPLANT
SUT VIC AB 2-0 CT1 TAPERPNT 27 (SUTURE) ×1 IMPLANT
SUT VIC AB 2-0 CTX 27 (SUTURE) ×2 IMPLANT
SYRINGE 10CC LL (SYRINGE) ×2 IMPLANT
SYSTEM SAHARA CHEST DRAIN ATS (WOUND CARE) ×4 IMPLANT
TAPE CLOTH SURG 4X10 WHT LF (GAUZE/BANDAGES/DRESSINGS) ×2 IMPLANT
TAPE PAPER 2X10 WHT MICROPORE (GAUZE/BANDAGES/DRESSINGS) ×2 IMPLANT
TOWEL OR 17X24 6PK STRL BLUE (TOWEL DISPOSABLE) ×2 IMPLANT
TOWEL OR 17X26 10 PK STRL BLUE (TOWEL DISPOSABLE) ×2 IMPLANT
TRAY FOLEY IC TEMP SENS 16FR (CATHETERS) ×2 IMPLANT
TROCAR XCEL BLADELESS 5X75MML (TROCAR) ×2 IMPLANT
TROCAR XCEL NON-BLD 11X100MML (ENDOMECHANICALS) ×4 IMPLANT
TUNNELER SHEATH ON-Q 11GX8 DSP (PAIN MANAGEMENT) IMPLANT
UNDERPAD 30X30 INCONTINENT (UNDERPADS AND DIAPERS) ×2 IMPLANT
WATER STERILE IRR 1000ML POUR (IV SOLUTION) ×4 IMPLANT
WIRE BENTSON .035X145CM (WIRE) ×2 IMPLANT

## 2014-04-05 NOTE — Progress Notes (Signed)
  Echocardiogram Echocardiogram Transesophageal has been performed.  Monchel Pollitt 04/05/2014, 8:42 AM

## 2014-04-05 NOTE — Progress Notes (Signed)
04/05/2014 4:24 PM  Spoke with Dr. Roxy Manns [id number] antibiotics.  Will schedule Zinacef x 1 dose tonight, Vancomycin x 1 dose tomorrow AM and resume Ancef for prolonged treatment of MSSA endocarditis.  This will provide >48 hours of post-op antibiotic coverage given patient's ESRD.  [employer], Pharm.D., BCPS Clinical Pharmacist Pager [phone] 04/05/2014 4:26 PM

## 2014-04-05 NOTE — Anesthesia Procedure Notes (Addendum)
Procedure Name: Intubation Date/Time: 04/05/2014 8:06 AM Performed by: Maeola Harman Pre-anesthesia Checklist: Patient identified, Emergency Drugs available, Suction available, Patient being monitored and Timeout performed Patient Re-evaluated:Patient Re-evaluated prior to inductionOxygen Delivery Method: Circle system utilized Preoxygenation: Pre-oxygenation with 100% oxygen Intubation Type: IV induction Ventilation: Mask ventilation without difficulty and Oral airway inserted - appropriate to patient size Laryngoscope Size: Mac and 4 Grade View: Grade I Tube type: Oral Endobronchial tube: Left, Double lumen EBT, EBT position confirmed by fiberoptic bronchoscope and EBT position confirmed by auscultation and 41 Fr Number of attempts: 2 (SRNA attempt then Dr. Ermalene Postin with Grade I view and atraumatically passed ETT) Airway Equipment and Method: Stylet,  Fiberoptic brochoscope and Oral airway Placement Confirmation: ETT inserted through vocal cords under direct vision,  positive ETCO2 and breath sounds checked- equal and bilateral Tube secured with: Tape Dental Injury: Teeth and Oropharynx as per pre-operative assessment

## 2014-04-05 NOTE — Progress Notes (Addendum)
TCTS BRIEF SICU PROGRESS NOTE  Day of Surgery  S/P Procedure(s) (LRB): MINIMALLY INVASIVE MITRAL VALVE REPAIR (MVR) (Right) INTRAOPERATIVE TRANSESOPHAGEAL ECHOCARDIOGRAM (N/A)   Patient's early postoperative recovery was entirely uneventful with stable hemodynamics and minimal chest tube output.  He was extubated approximately 1 hour ago without difficulty.  Shortly after extubation he began coughing forcefully, and subsequently he developed sudden onset dramatically increased chest tube output with dark looking blood via chest tubes.  Initially his BP dropped but it has rebounded with volume administration.  He has now put out in excess of 1000 mL of dark blood.  Plan: Will return to OR for reexploration.  Discussed with patient who is awake, alert and understands the circumstances.  We have attempted to contact his family via telephone.  Carletta Feasel H 04/05/2014 11:23 PM

## 2014-04-05 NOTE — Anesthesia Procedure Notes (Signed)
Performed by: Valetta Fuller

## 2014-04-05 NOTE — Progress Notes (Signed)
PT Cancellation Note  Patient Details Name: Timothy Palmer MRN: [id number] DOB: [birth year]   Cancelled Treatment:    Reason Eval/Treat Not Completed: Patient at procedure or test/unavailable   [name], Thornton Papas 04/05/2014, 6:55 AM

## 2014-04-05 NOTE — OR Nursing (Signed)
Thrill was felt in left arm fistula both pre and post op

## 2014-04-05 NOTE — Progress Notes (Signed)
Unable to obtain pre-op U/A ordered as pt did not void overnight. Pt transported to OR via stretcher, central telemetry notified and pt removed from monitor. Report given to anesthesiologist. Ronnette Hila, RN

## 2014-04-05 NOTE — Progress Notes (Signed)
Patient ID: Timothy Palmer, male   DOB: [birth year], 48 y.o.   MRN: [id number]  [name] Progress Note   Assessment/ Plan:   1 MV endocarditis / MSSA bacteremia- on IV Ancef- for 6 weeks, s/p minimally invasive MV repair earlier today (valvuloplasty with pericardial patch repair of posterior leaflet. Suspected infection source at this time is his buttonhole cannulation sites of left radiocephalic fistula 2 Septic embolus + mycotic aneurysm LLE- s/p fem-pop BPG 9/18 -clinical with improved perfusion to the distal extremity. 3 ESRD on HD - he is usually on an outpatient schedule of TTS. He will be dialyzed tomorrow as there are no acute HD needs noted at this time (on pressors) 4 HTN blood pressures being supported with pressors while on milrinone and amiodarone 5 Anemia hgb 9.2 aranesp at 40/wk-will most likely increase this postoperatively anticipating resistance 6 HPTH cont sensipar, renvela, vit D on hold d/t ^Ca  Subjective:   Mitral valve repair earlier today and transferred to thoracic surgery unit.   Objective:   BP 106/54  Pulse 78  Temp(Src) 98.2 F (36.8 C) (Oral)  Resp 12  Ht 5\' 11"  (1.803 m)  Wt 98.884 kg (218 lb)  BMI 30.42 kg/m2  SpO2 97%  Physical Exam: Gen: Intubated/sedated on pressors/inotrope CVS: Pulse regular in rate and rhythm, distant S1 and S2 Resp: Clear to auscultation bilaterally, no rales/rhonchi Abd: Soft, flat, nontender Ext: Trace left lower extremity edema, no right lower extremity edema. Improving surgical wounds left leg. Left radiocephalic fistula with well-formed buttonhole cannulation zones   Labs: BMET  Recent Labs Lab 03/30/14 0312 03/31/14 0713 04/01/14 0405 04/02/14 0445 04/02/14 0716 04/04/14 0415 04/04/14 1105 04/05/14 0419 04/05/14 0822 04/05/14 1005 04/05/14 1033 04/05/14 1204 04/05/14 1244 04/05/14 1344 04/05/14 1430 04/05/14 1515 04/05/14 1602  NA 138 136* 140 139 139 138  --  137 130* 131* 134*  --   135*  --  135* 135* 136*  K 4.0 3.9 4.4 4.1 4.2 4.4  --  4.4 3.9 3.9 4.0  --  3.4*  --  3.7 4.1 4.4  CL 94* 95* 95* 95* 96 95*  --  94* 104 102 97  --  101  --  104 105  --   CO2 29 26 28 26 23 26   --  29  --   --   --   --   --   --   --   --   --   GLUCOSE 96 101* 98 93 94 90  --  89 120* 114* 103* 170* 202* 320* 225* 172* 135*  BUN 21 34* 23 33* 31* 32*  --  19 18 19 18   --  21  --  20 20  --   CREATININE 5.91* 9.03* 6.80* 9.56* 9.30* 10.00*  --  7.01* 7.50* 7.80* 6.40*  --  7.20*  --  6.50* 6.70*  --   CALCIUM 10.9* 11.0* 10.9* 11.3* 10.5 10.6*  --  10.6*  --   --   --   --   --   --   --   --   --   PHOS  --  6.4* 5.0* 7.3* 6.3*  --  6.9*  --   --   --   --   --   --   --   --   --   --    CBC  Recent Labs Lab 04/02/14 0445 04/02/14 0716 04/04/14 0415 04/05/14 0419  04/05/14 1240  04/05/14 1244 04/05/14 1430 04/05/14 1515 04/05/14 1602  WBC 11.1* 10.5 12.4* 10.2  --   --   --   --   --   --   HGB 9.8* 9.2* 9.7* 9.9*  < > 8.3* 9.2* 8.5* 9.5* 10.2*  HCT 30.5* 28.6* 30.0* 30.9*  < > 25.6* 27.0* 25.0* 28.0* 30.0*  MCV 95.3 91.7 93.2 93.9  --   --   --   --   --   --   PLT 300 301 299 252  --  262  --   --   --   --   < > = values in this interval not displayed. Medications:    . [START ON 04/06/2014] acetaminophen  1,000 mg Oral 4 times per day   Or  . [START ON 04/06/2014] acetaminophen (TYLENOL) oral liquid 160 mg/5 mL  1,000 mg Per Tube 4 times per day  . acetaminophen (TYLENOL) oral liquid 160 mg/5 mL  650 mg Per Tube Once   Or  . acetaminophen  650 mg Rectal Once  . [START ON 04/06/2014] aspirin EC  325 mg Oral Daily   Or  . [START ON 04/06/2014] aspirin  324 mg Per Tube Daily  . [START ON 04/06/2014] bisacodyl  10 mg Oral Daily   Or  . [START ON 04/06/2014] bisacodyl  10 mg Rectal Daily  .  ceFAZolin (ANCEF) IV  2 g Intravenous Q T,Th,Sat-1800  . cefUROXime (ZINACEF)  IV  1.5 g Intravenous Q12H  . darbepoetin (ARANESP) injection - DIALYSIS  40 mcg Intravenous Q Thu-HD  .  [START ON 04/06/2014] docusate sodium  200 mg Oral Daily  . famotidine (PEPCID) IV  20 mg Intravenous Q12H  . insulin regular  0-10 Units Intravenous TID WC  . metoprolol tartrate  12.5 mg Oral BID   Or  . metoprolol tartrate  12.5 mg Per Tube BID  . [START ON 04/07/2014] pantoprazole  40 mg Oral Daily  . [START ON 04/06/2014] sodium chloride  3 mL Intravenous Q12H  . vancomycin  1,000 mg Intravenous Once   Elmarie Shiley, MD 04/05/2014, 4:13 PM

## 2014-04-05 NOTE — Progress Notes (Signed)
TCTS BRIEF SICU PROGRESS NOTE  Day of Surgery  S/P Procedure(s) (LRB): MINIMALLY INVASIVE MITRAL VALVE REPAIR (MVR) (Right) INTRAOPERATIVE TRANSESOPHAGEAL ECHOCARDIOGRAM (N/A)   Waking up on vent.  Follows commands AAI paced w/ stable hemodynamics on low dose milrinone, vasopressin and levophed O2 sats 100% Chest tube output low  Plan: Continue routine early postop.  Wean levophed as tolerated and leave vasopressin and milrinone as is overnight.  Wean vent as tolerated.  Will reassess whether or not he should have HD in am tomorrow depending upon PA pressures, hemodynamics and serum potassium level.  Rexene Alberts 04/05/2014 7:31 PM

## 2014-04-05 NOTE — Anesthesia Preprocedure Evaluation (Addendum)
Anesthesia Evaluation  Patient identified by MRN, date of birth, ID band Patient awake    Reviewed: Allergy & Precautions, H&P , NPO status , Patient's Chart, lab work & pertinent test results, reviewed documented beta blocker date and time   History of Anesthesia Complications Negative for: history of anesthetic complications  Airway Mallampati: II TM Distance: >3 FB Neck ROM: Full    Dental   Pulmonary pneumonia -, resolved,    + decreased breath sounds      Cardiovascular hypertension, Pt. on medications - angina+ Peripheral Vascular Disease and +CHF + Valvular Problems/Murmurs MR Rhythm:Regular     Neuro/Psych negative neurological ROS  negative psych ROS   GI/Hepatic negative GI ROS, Neg liver ROS,   Endo/Other  negative endocrine ROS  Renal/GU ESRF and DialysisRenal disease     Musculoskeletal negative musculoskeletal ROS (+)   Abdominal   Peds  Hematology  (+) anemia ,   Anesthesia Other Findings   Reproductive/Obstetrics negative OB ROS                         Anesthesia Physical Anesthesia Plan  ASA: IV  Anesthesia Plan: General   Post-op Pain Management:    Induction: Intravenous  Airway Management Planned: Oral ETT  Additional Equipment: Arterial line, TEE, CVP, 3D TEE, PA Cath and Ultrasound Guidance Line Placement  Intra-op Plan:   Post-operative Plan: Post-operative intubation/ventilation  Informed Consent: I have reviewed the patients History and Physical, chart, labs and discussed the procedure including the risks, benefits and alternatives for the proposed anesthesia with the patient or authorized representative who has indicated his/her understanding and acceptance.   Dental advisory given  Plan Discussed with: CRNA and Surgeon  Anesthesia Plan Comments:         Anesthesia Quick Evaluation

## 2014-04-05 NOTE — Brief Op Note (Addendum)
04/05/2014  1:34 PM  PATIENT:  Timothy Palmer  48 y.o. 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  PROCEDURE:   MINIMALLY INVASIVE MITRAL VALVE REPAIR  Debridement of posterior leaflet with autologous pericardial patch repair  28 mm Sorin Memo 3D annuloplasty ring  SURGEON:    Rexene Alberts, MD  ASSISTANTS:  Coolidge Breeze, PA-C  ANESTHESIA:   Laurie Panda, MD  CROSSCLAMP TIME:   49'  CARDIOPULMONARY BYPASS TIME: 214'  FINDINGS:  Large perforation of posterior leaflet without active vegetations  Intraoperative Gram stain with no organisms seen  Type I dysfunction with severe (4+) mitral regurgitation  Mild global LV systolic dysfunction  No residual mitral regurgitation after successful valve repair   Mitral Valve Procedure  Mitral Valve Procedure Performed:  Repair: Annuloplasty.  Implant: Annuloplasty Device: Implant model number [id number], Size 28, Unique Device Identifier [id number].   Mitral Valve Etiology  MV Insufficiency: Severe  MV Disease: Yes  MV Stenosis: No mitral valve stenosis.  MV Disease Functional Class: MV Disease Functional Class: Type I.   Etiology (Choose at least one and up to five): Endocarditis.  MV Lesions (Choose at least one): perforation of posterior leaflet  COMPLICATIONS: None  BASELINE WEIGHT: 96 kg  PATIENT DISPOSITION:   TO SICU IN STABLE CONDITION  [name],[name] 04/05/2014 3:09 PM

## 2014-04-05 NOTE — Anesthesia Postprocedure Evaluation (Signed)
  Anesthesia Post-op Note  Patient: Timothy Palmer  Procedure(s) Performed: Procedure(s): MINIMALLY INVASIVE MITRAL VALVE REPAIR (MVR) (Right) INTRAOPERATIVE TRANSESOPHAGEAL ECHOCARDIOGRAM (N/A)  Patient Location: ICU  Anesthesia Type:General  Level of Consciousness: sedated  Airway and Oxygen Therapy: Patient remains intubated per anesthesia plan  Post-op Pain: none  Post-op Assessment: Post-op Vital signs reviewed, Patient's Cardiovascular Status Stable, Respiratory Function Stable and Patent Airway  Post-op Vital Signs: Reviewed and stable  Last Vitals:  Filed Vitals:   04/05/14 1645  BP:   Pulse: 76  Temp: 35.8 C  Resp: 14    Complications: No apparent anesthesia complications

## 2014-04-05 NOTE — Transfer of Care (Signed)
Immediate Anesthesia Transfer of Care Note  Patient: Timothy Palmer  Procedure(s) Performed: Procedure(s): MINIMALLY INVASIVE MITRAL VALVE REPAIR (MVR) (Right) INTRAOPERATIVE TRANSESOPHAGEAL ECHOCARDIOGRAM (N/A)  Patient Location: SICU  Anesthesia Type:General  Level of Consciousness: Patient remains intubated per anesthesia plan  Airway & Oxygen Therapy: Patient remains intubated per anesthesia plan  Post-op Assessment: Post -op Vital signs reviewed and stable  Post vital signs: stable  Complications: No apparent anesthesia complications

## 2014-04-05 NOTE — Progress Notes (Signed)
Pt given cardiac surgery booklet and refused teaching at this time. Pt refused to watch cardiac surgery video at this time. Pt prepped for surgery this AM. Incentive spirometer at bedside.

## 2014-04-05 NOTE — Procedures (Signed)
Extubation Procedure Note  Patient Details:   Name: Timothy Palmer DOB: 11-Sep-1965 MRN: [id number]   Patient extubated to 4 lpm nasal cannula.  VC 900 ml, NIF -50, patient able to lift head off bed.  Patient able to breathe around deflated cuff and vocalize post procedure.  Tolerated well, no complications.  Evaluation  O2 sats: stable throughout Complications: No apparent complications Patient did tolerate procedure well. Bilateral Breath Sounds: Diminished;Other (Comment) (coarse) Suctioning: Airway Yes  Zebulen Simonis, Elwyn Lade 04/05/2014, 10:29 PM

## 2014-04-05 NOTE — Op Note (Signed)
CARDIOTHORACIC SURGERY OPERATIVE NOTE  Date of Procedure:  04/05/2014  Preoperative Diagnosis:   Bacterial Endocarditis  Severe Mitral Regurgitation  Acute Combined Systolic and Diastolic Congestive Heart Failure  Septic Embolization to Left Lower Extremity, Spleen and Small Intestine  Postoperative Diagnosis: Same  Procedure:    Minimally-Invasive Mitral Valve Repair  Complex valvuloplasty including autologous pericardial patch repair of perforation of posterior leaflet  Sorin Memo 3D Ring Annuloplasty (size 68mm, catalog # [id number], serial # [id number])    Placement of Left Femoral Arterial Line  Surgeon: Valentina Gu. Roxy Manns, MD  Assistant: Arlyn Leak. Theda Sers, PA-C  Anesthesia: Laurie Panda, MD  Operative Findings: Large perforation of posterior leaflet without active vegetations  Intraoperative Gram stain with no organisms seen  Type I dysfunction with severe (4+) mitral regurgitation  Mild global LV systolic dysfunction  No residual mitral regurgitation after successful valve repair                BRIEF CLINICAL NOTE AND INDICATIONS FOR SURGERY  Patient is a 48 year old [place] American male from [place] with history of end-stage renal disease for which he has been dialysis dependent for the past 11 years. The patient originally developed renal failure related to long-standing hypertension. He dialyzes 3 times a week at the [facility] dialysis center on a Tuesday Thursday Saturday schedule via left forearm primary AV fistula. Over the years the patient has done fairly well on dialysis therapy. He did undergo revision of his left radiocephalic AV fistula using bovine pericardial patch angioplasty on 07/23/2013 because of proximal radial artery occlusion. This past May he developed transient methicillin sensitive Staphylococcus aureus sepsis that was treated with intravenous cefazolin during dialysis. Followup blood cultures after completion of therapy reportedly  were negative. The patient developed recurrent positive blood cultures obtained during dialysis therapy on 02/24/2014 which again grew MSSA. He was started on intravenous cefazolin therapy with dialysis which he had been receiving up until the time of this hospital admission. In addition, the patient was seen in the emergency department 03/04/2014 for right arm pain and was found to have a DVT in the right upper arm. Anticoagulation therapy was not recommended and no further workup was performed.   The patient developed progressive exertional shortness of breath and dry nonproductive cough. Over the last 2 weeks prior to admission he experienced intermittent resting shortness of breath and orthopnea. He also began to experience intermittent fevers and chills. On 03/21/2014 the patient developed sudden onset pain in his left knee. He states that he felt his knee pop and ever since that time he has had significant pain with difficulty bearing weight on his left leg. He was admitted to the hospital where he was initially treated for possible HCAP using intravenous vancomycin and cefepime. He was noted to have a murmur on physical exam, and transthoracic echocardiogram demonstrated moderate to severe mitral regurgitation with possible vegetation on the mitral valve. Transesophageal echocardiogram was performed earlier by Dr. Aundra Dubin and confirmed the presence of what appears to be a small vegetation adherent to the posterior leaflet of the mitral valve and severe (4+) mitral regurgitation. Cardiothoracic surgical consultation was requested.  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  Patient was evaluated by Dr. Donnetta Hutching from the vascular surgery team  and subsequently  underwent embolectomy followed by reversed saphenous vein graft bypass of the popliteal artery. The patient clinically improved over the subsequent week with gradual improvement in symptoms of congestive heart failure.   The patient and his family were counseled at length regarding the indications, risks and potential benefits of mitral valve repair or replacement.  Alternative treatment strategies were discussed at length.  All questions have been answered, and the patient provides full informed consent for the operation as described.         DETAILS OF THE OPERATIVE PROCEDURE  Preparation:  The patient is brought to the operating room on the above mentioned date and central monitoring was established by the anesthesia team including placement of Swan-Ganz catheter through the left internal jugular vein.  A right radial arterial line is placed. The patient is placed in the supine position on the operating table.  Intravenous antibiotics are administered. General endotracheal anesthesia is induced uneventfully. The patient is initially intubated using a dual lumen endotracheal tube.  A Foley catheter is placed.  Baseline transesophageal echocardiogram was performed.  Findings were notable for severe (4+) mitral regurgitation.  There appeared to be a perforation in the posterior leaflet of the mitral valve. There was some excessive tissue motion potentially consistent with the presence of small vegetation versus devitalized leaflet tissue. The anterior leaflet appeared normal. There were no other obvious vegetation seen. There was no sign of annular abscess formation. The aortic valve appeared normal and there were no vegetations on the aortic valve. There was mild global left ventricular dysfunction. There was trivial tricuspid regurgitation. The tricuspid valve appears normal. No other significant abnormalities were noted.   A soft roll is placed behind the patient's left scapula and  the neck gently extended and turned to the left.   The patient's right neck, chest, abdomen, both groins, and both lower extremities are prepared and draped in a sterile manner. A time out procedure is performed.  Surgical Approach:  A right miniature anterolateral thoracotomy incision is performed. The incision is placed just lateral to and superior to the right nipple. The pectoralis major muscle is retracted medially and completely preserved. The right pleural space is entered through the 3rd intercostal space. A soft tissue retractor is placed.  Two 11 mm ports are placed through separate stab incisions inferiorly. The right pleural space is insufflated continuously with carbon dioxide gas through the posterior port during the remainder of the operation.  There are dense adhesions between the right middle lobe and the dome of the right diaphragm. These are divided using a combination of sharp dissection and electrocautery.  A pledgeted sutures placed through the dome of the right hemidiaphragm and retracted inferiorly to facilitate exposure.  A longitudinal incision is made in the pericardium 3 cm anterior to the phrenic nerve and silk traction sutures are placed on either side of the incision for exposure.   Placement of Left Femoral Arterial Line:  The patient's right radial arterial line is noted to be poorly functional with tendency for waveform damping and inconsistent blood pressure monitoring. Subsequently, a left femoral arterial line is placed using the Seldinger technique for monitoring purposes throughout the remainder of the procedure.   Extracorporeal Cardiopulmonary Bypass and Myocardial Protection:  A small incision is made in the right inguinal crease and the anterior surface of the right common femoral artery and right common femoral vein are identified.  The patient is placed in Trendelenburg position. The right internal jugular vein is cannulated with Seldinger technique  and a  guidewire advanced into the right atrium. The patient is heparinized systemically. The right internal jugular vein is cannulated with a 15 [place] pediatric femoral venous cannula. Pursestring sutures are placed on the anterior surface of the right common femoral vein and right common femoral artery. The right common femoral vein is cannulated with the Seldinger technique and a guidewire is advanced under transesophageal echocardiogram guidance through the right atrium. The femoral vein is cannulated with a long 22 French femoral venous cannula. The right common femoral artery is cannulated with Seldinger technique and a flexible guidewire is advanced until it can be appreciated intraluminally in the descending thoracic aorta on transesophageal echocardiogram. The femoral artery is cannulated with an 18 French femoral arterial cannula.  Adequate heparinization is verified.     The entire pre-bypass portion of the operation was notable for stable hemodynamics.  Cardiopulmonary bypass was begun.  Vacuum assist venous drainage is utilized. The incision in the pericardium is extended in both directions. A large portion of pericardium is removed and subsequently tanned in glutaraldehyde solution to be utilized later during the procedure for mitral valve repair.  The pericardium is soaked in glutaraldehyde for a total of 3 minutes, after which time it is rinsed in consecutive baths of saline.  Venous drainage and exposure are notably excellent.  An antegrade cardioplegia cannula is placed in the ascending aorta.    The patient is cooled to 28C systemic temperature.  The aortic cross clamp is applied and cold blood cardioplegia is delivered initially in an antegrade fashion through the aortic root.   The initial cardioplegic arrest is rapid with early diastolic arrest.  Repeat doses of cardioplegia are administered intermittently every 20 to 30 minutes throughout the entire cross clamp portion of the operation  through the aortic root in order to maintain completely flat electrocardiogram.  Myocardial protection was felt to be excellent.   Mitral Valve Repair:  A left atriotomy incision was performed through the interatrial groove and extended partially across the back wall of the left atrium after opening the oblique sinus inferiorly.  The mitral valve is exposed using a self-retaining retractor. There is an obvious large perforation in the posterior leaflet of the mitral valve located in the P2 segment of the posterior leaflet immediately adjacent to the cleft between P2 and P3. There are no signs of acute ongoing inflammation and there are no obvious vegetations. Swab cultures obtained from initial exposure and sent for stat Gram stain with routine culture and sensitivity. Stat Gram stain is notable for the absence of any organisms. The edges of the perforation in the posterior leaflet are debrided using sharp dissection. Some whitish material is encountered in the posterior annulus during this dissection. An additional swab cultures obtained of this material and again stat Gram stain is notable for the absence of any organisms. After thorough debridement the entire left atrium, mitral valve, subvalvular apparatus, and left ventricle were irrigated with copious saline solution using a pulse lavage irrigation device. The valve is subsequently irrigated with vancomycin containing solution and painted with Betadine solution. All instruments are changed.  Interrupted 2-0 Ethibond horizontal mattress sutures are placed circumferentially around the entire mitral valve annulus. The sutures will ultimately be utilized for ring annuloplasty, and at this juncture there are utilized to suspend the valve symmetrically.  The large perforation in the posterior leaflet was repaired using a simple patch of autologous pericardium. Of note, the perforation did not involve the free margin of the posterior leaflet  such that all  the subvalvular apparatus was preserved. A patch of pericardium is trimmed to an appropriate size and shape and subsequently sewn in place using a double layer closure of running 4-0 Prolene suture. After completion of the patch repair the valve was tested with saline and appears to be competent.  The valve was sized to a 28 mm annuloplasty ring, based upon the transverse distance between the left and right commissures and the height of the anterior leaflet, corresponding to a size just slightly larger than the overall surface area of the anterior leaflet.  A Sorin Memo 3D annuloplasty ring (size 26mm, catalog [id number], serial [id number]) was secured in place uneventfully. All ring sutures were secured using a Cor-knot device.  The valve was tested with saline and appeared competent.   The valve is again tested with saline and appears to be perfectly competent with a broad symmetrical line of coaptation of the anterior and posterior leaflet. There is no residual leak. There was a broad, symmetrical line of coaptation of the anterior and posterior leaflet which was confirmed using the blue ink test.  Rewarming is begun.   Procedure Completion:  The atriotomy was closed using a 2-layer closure of running 3-0 Prolene suture after placing a sump drain across the mitral valve to serve as a left ventricular vent.  One final dose of warm "hot shot" cardioplegia was administered through the aortic root.  The aortic cross clamp was removed after a total cross clamp time of 163 minutes.  Epicardial pacing wires are fixed to the inferior wall of the right ventricule and to the right atrial appendage. The patient is rewarmed to 37C temperature. The left ventricular vent is removed.  The patient is ventilated and flow volumes turndown while the mitral valve repair is inspected using transesophageal echocardiogram. The valve repair appears intact with no residual leak. The antegrade cardioplegia cannula is now removed. The  patient is weaned and disconnected from cardiopulmonary bypass.  The patient's rhythm at separation from bypass was atrial fibrillation.  The patient was weaned from bypass on low dose milrinone and levophed infusions. Total cardiopulmonary bypass time for the operation was 214 minutes.  Amiodarone infusion was added after separation from bypass because of persistent atrial fibrillation despite an attempt at DC cardioversion.  Followup transesophageal echocardiogram performed after separation from bypass revealed a well-seated annuloplasty ring in the mitral position with a normal functioning mitral valve. There was no residual leak.  Left ventricular function was unchanged from preoperatively.  The mean gradient across the mitral valve was estimated to be 3 mmHg.  The femoral arterial and venous cannulae were removed uneventfully. There was a palpable pulse in the distal right common femoral artery after removal of the cannula. Protamine was administered to reverse the anticoagulation. The right internal jugular cannula was removed and manual pressure held on the neck for 15 minutes.  Single lung ventilation was begun. The atriotomy closure was inspected for hemostasis. The pericardial sac was drained using a 28 French Bard drain placed through the anterior port incision.  The pericardium was closed using a patch of core matrix bovine submucosal tissue patch. The right pleural space is irrigated with saline solution and inspected for hemostasis. The right pleural space was drained using a 28 French Bard drain placed through the posterior port incision. The miniature thoracotomy incision was closed in multiple layers in routine fashion. The right groin incision was inspected for hemostasis and closed in multiple layers in routine fashion.  The  post-bypass portion of the operation was notable for stable hemodynamics and persistent atrial fibrillation with controlled rate.  No blood products were administered  during the operation.   Disposition:  The patient tolerated the procedure well.  The patient was reintubated using a single lumen endotracheal tube and subsequently transported to the surgical intensive care unit in stable condition. There were no intraoperative complications. All sponge instrument and needle counts are verified correct at completion of the operation.     Valentina Gu. Roxy Manns MD 04/05/2014 3:16 PM

## 2014-04-06 ENCOUNTER — Inpatient Hospital Stay (HOSPITAL_COMMUNITY): Payer: Medicare Other

## 2014-04-06 ENCOUNTER — Encounter (HOSPITAL_COMMUNITY): Payer: Self-pay | Admitting: Thoracic Surgery (Cardiothoracic Vascular Surgery)

## 2014-04-06 ENCOUNTER — Encounter (HOSPITAL_COMMUNITY): Payer: Medicare Other | Admitting: Anesthesiology

## 2014-04-06 ENCOUNTER — Inpatient Hospital Stay (HOSPITAL_COMMUNITY): Payer: Medicare Other | Admitting: Anesthesiology

## 2014-04-06 DIAGNOSIS — IMO0002 Reserved for concepts with insufficient information to code with codable children: Secondary | ICD-10-CM

## 2014-04-06 DIAGNOSIS — N186 End stage renal disease: Secondary | ICD-10-CM | POA: Diagnosis not present

## 2014-04-06 LAB — POCT I-STAT 3, ART BLOOD GAS (G3+)
ACID-BASE DEFICIT: 4 mmol/L — AB (ref 0.0–2.0)
ACID-BASE DEFICIT: 8 mmol/L — AB (ref 0.0–2.0)
ACID-BASE DEFICIT: 4 mmol/L — AB (ref 0.0–2.0)
Acid-base deficit: 7 mmol/L — ABNORMAL HIGH (ref 0.0–2.0)
Acid-base deficit: 1 mmol/L (ref 0.0–2.0)
BICARBONATE: 21.6 meq/L (ref 20.0–24.0)
BICARBONATE: 20.6 meq/L (ref 20.0–24.0)
Bicarbonate: 19.2 mEq/L — ABNORMAL LOW (ref 20.0–24.0)
Bicarbonate: 23.2 mEq/L (ref 20.0–24.0)
Bicarbonate: 23 mEq/L (ref 20.0–24.0)
O2 SAT: 97 %
O2 SAT: 96 %
O2 SAT: 88 %
O2 Saturation: 92 %
O2 Saturation: 100 %
PCO2 ART: 32.6 mmHg — AB (ref 35.0–45.0)
PH ART: 7.215 — AB (ref 7.350–7.450)
PH ART: 7.234 — AB (ref 7.350–7.450)
PO2 ART: 76 mmHg — AB (ref 80.0–100.0)
Patient temperature: 36.4
Patient temperature: 35.6
TCO2: 23 mmol/L (ref 0–100)
TCO2: 22 mmol/L (ref 0–100)
TCO2: 21 mmol/L (ref 0–100)
TCO2: 25 mmol/L (ref 0–100)
TCO2: 24 mmol/L (ref 0–100)
pCO2 arterial: 37.7 mmHg (ref 35.0–45.0)
pCO2 arterial: 51.1 mmHg — ABNORMAL HIGH (ref 35.0–45.0)
pCO2 arterial: 45.4 mmHg — ABNORMAL HIGH (ref 35.0–45.0)
pCO2 arterial: 49.8 mmHg — ABNORMAL HIGH (ref 35.0–45.0)
pH, Arterial: 7.363 (ref 7.350–7.450)
pH, Arterial: 7.27 — ABNORMAL LOW (ref 7.350–7.450)
pH, Arterial: 7.451 — ABNORMAL HIGH (ref 7.350–7.450)
pO2, Arterial: 91 mmHg (ref 80.0–100.0)
pO2, Arterial: 93 mmHg (ref 80.0–100.0)
pO2, Arterial: 60 mmHg — ABNORMAL LOW (ref 80.0–100.0)
pO2, Arterial: 218 mmHg — ABNORMAL HIGH (ref 80.0–100.0)

## 2014-04-06 LAB — POCT I-STAT 4, (NA,K, GLUC, HGB,HCT)
Glucose, Bld: 219 mg/dL — ABNORMAL HIGH (ref 70–99)
HCT: 32 % — ABNORMAL LOW (ref 39.0–52.0)
Hemoglobin: 10.9 g/dL — ABNORMAL LOW (ref 13.0–17.0)
Potassium: 6.6 mEq/L (ref 3.7–5.3)
Sodium: 132 mEq/L — ABNORMAL LOW (ref 137–147)

## 2014-04-06 LAB — POCT I-STAT, CHEM 8
BUN: 25 mg/dL — AB (ref 6–23)
BUN: 25 mg/dL — AB (ref 6–23)
BUN: 11 mg/dL (ref 6–23)
CALCIUM ION: 1.38 mmol/L — AB (ref 1.12–1.23)
CALCIUM ION: 1.24 mmol/L — AB (ref 1.12–1.23)
CHLORIDE: 105 meq/L (ref 96–112)
CREATININE: 3.9 mg/dL — AB (ref 0.50–1.35)
Calcium, Ion: 1.17 mmol/L (ref 1.12–1.23)
Chloride: 104 mEq/L (ref 96–112)
Chloride: 101 mEq/L (ref 96–112)
Creatinine, Ser: 6.9 mg/dL — ABNORMAL HIGH (ref 0.50–1.35)
Creatinine, Ser: 6.9 mg/dL — ABNORMAL HIGH (ref 0.50–1.35)
GLUCOSE: 150 mg/dL — AB (ref 70–99)
Glucose, Bld: 232 mg/dL — ABNORMAL HIGH (ref 70–99)
Glucose, Bld: 98 mg/dL (ref 70–99)
HCT: 27 % — ABNORMAL LOW (ref 39.0–52.0)
HCT: 32 % — ABNORMAL LOW (ref 39.0–52.0)
HEMATOCRIT: 28 % — AB (ref 39.0–52.0)
HEMOGLOBIN: 9.5 g/dL — AB (ref 13.0–17.0)
Hemoglobin: 9.2 g/dL — ABNORMAL LOW (ref 13.0–17.0)
Hemoglobin: 10.9 g/dL — ABNORMAL LOW (ref 13.0–17.0)
POTASSIUM: 3.4 meq/L — AB (ref 3.7–5.3)
Potassium: 5.4 mEq/L — ABNORMAL HIGH (ref 3.7–5.3)
Potassium: 6 mEq/L — ABNORMAL HIGH (ref 3.7–5.3)
Sodium: 134 mEq/L — ABNORMAL LOW (ref 137–147)
Sodium: 132 mEq/L — ABNORMAL LOW (ref 137–147)
Sodium: 137 mEq/L (ref 137–147)
TCO2: 21 mmol/L (ref 0–100)
TCO2: 18 mmol/L (ref 0–100)
TCO2: 26 mmol/L (ref 0–100)

## 2014-04-06 LAB — GLUCOSE, CAPILLARY
GLUCOSE-CAPILLARY: 98 mg/dL (ref 70–99)
GLUCOSE-CAPILLARY: 79 mg/dL (ref 70–99)
GLUCOSE-CAPILLARY: 112 mg/dL — AB (ref 70–99)
GLUCOSE-CAPILLARY: 113 mg/dL — AB (ref 70–99)
Glucose-Capillary: 83 mg/dL (ref 70–99)
Glucose-Capillary: 105 mg/dL — ABNORMAL HIGH (ref 70–99)
Glucose-Capillary: 112 mg/dL — ABNORMAL HIGH (ref 70–99)
Glucose-Capillary: 99 mg/dL (ref 70–99)
Glucose-Capillary: 188 mg/dL — ABNORMAL HIGH (ref 70–99)
Glucose-Capillary: 174 mg/dL — ABNORMAL HIGH (ref 70–99)
Glucose-Capillary: 144 mg/dL — ABNORMAL HIGH (ref 70–99)
Glucose-Capillary: 117 mg/dL — ABNORMAL HIGH (ref 70–99)
Glucose-Capillary: 99 mg/dL (ref 70–99)
Glucose-Capillary: 111 mg/dL — ABNORMAL HIGH (ref 70–99)
Glucose-Capillary: 103 mg/dL — ABNORMAL HIGH (ref 70–99)
Glucose-Capillary: 106 mg/dL — ABNORMAL HIGH (ref 70–99)

## 2014-04-06 LAB — CBC
HCT: 25.9 % — ABNORMAL LOW (ref 39.0–52.0)
HEMATOCRIT: 29.5 % — AB (ref 39.0–52.0)
HEMOGLOBIN: 9.2 g/dL — AB (ref 13.0–17.0)
Hemoglobin: 9.9 g/dL — ABNORMAL LOW (ref 13.0–17.0)
MCH: 30.4 pg (ref 26.0–34.0)
MCH: 29.9 pg (ref 26.0–34.0)
MCHC: 33.6 g/dL (ref 30.0–36.0)
MCHC: 35.5 g/dL (ref 30.0–36.0)
MCV: 90.5 fL (ref 78.0–100.0)
MCV: 84.1 fL (ref 78.0–100.0)
PLATELETS: 96 10*3/uL — AB (ref 150–400)
PLATELETS: 87 10*3/uL — AB (ref 150–400)
RBC: 3.26 MIL/uL — ABNORMAL LOW (ref 4.22–5.81)
RBC: 3.08 MIL/uL — AB (ref 4.22–5.81)
RDW: 15.1 % (ref 11.5–15.5)
RDW: 15.6 % — ABNORMAL HIGH (ref 11.5–15.5)
WBC: 24.1 10*3/uL — AB (ref 4.0–10.5)
WBC: 16 10*3/uL — ABNORMAL HIGH (ref 4.0–10.5)

## 2014-04-06 LAB — CREATININE, SERUM
CREATININE: 3.7 mg/dL — AB (ref 0.50–1.35)
GFR calc Af Amer: 21 mL/min — ABNORMAL LOW (ref >90)
GFR calc non Af Amer: 18 mL/min — ABNORMAL LOW (ref >90)

## 2014-04-06 LAB — BASIC METABOLIC PANEL
Anion gap: 15 (ref 5–15)
BUN: 27 mg/dL — ABNORMAL HIGH (ref 6–23)
CALCIUM: 9.3 mg/dL (ref 8.4–10.5)
CHLORIDE: 101 meq/L (ref 96–112)
CO2: 20 meq/L (ref 19–32)
Creatinine, Ser: 6.87 mg/dL — ABNORMAL HIGH (ref 0.50–1.35)
GFR calc Af Amer: 10 mL/min — ABNORMAL LOW (ref >90)
GFR calc non Af Amer: 9 mL/min — ABNORMAL LOW (ref >90)
Glucose, Bld: 177 mg/dL — ABNORMAL HIGH (ref 70–99)
Potassium: 5.4 mEq/L — ABNORMAL HIGH (ref 3.7–5.3)
SODIUM: 136 meq/L — AB (ref 137–147)

## 2014-04-06 LAB — APTT: aPTT: 38 seconds — ABNORMAL HIGH (ref 24–37)

## 2014-04-06 LAB — PROTIME-INR
INR: 1.32 (ref 0.00–1.49)
Prothrombin Time: 16.4 seconds — ABNORMAL HIGH (ref 11.6–15.2)

## 2014-04-06 LAB — MAGNESIUM: Magnesium: 1.7 mg/dL (ref 1.5–2.5)

## 2014-04-06 MED ORDER — METOPROLOL TARTRATE 12.5 MG HALF TABLET
12.5000 mg | ORAL_TABLET | Freq: Two times a day (BID) | ORAL | Status: DC
  Filled 2014-04-06 (×2): qty 1

## 2014-04-06 MED ORDER — ALBUMIN HUMAN 5 % IV SOLN
INTRAVENOUS | Status: AC
  Filled 2014-04-06: qty 250

## 2014-04-06 MED ORDER — DEXMEDETOMIDINE HCL IN NACL 200 MCG/50ML IV SOLN
INTRAVENOUS | Status: DC | PRN
  Administered 2014-04-06: 0.3 ug/kg/h via INTRAVENOUS

## 2014-04-06 MED ORDER — MORPHINE SULFATE 2 MG/ML IJ SOLN
1.0000 mg | INTRAMUSCULAR | Status: AC | PRN
  Administered 2014-04-06 (×2): 2 mg via INTRAVENOUS
  Filled 2014-04-06: qty 1

## 2014-04-06 MED ORDER — DEXTROSE 5 % IV SOLN
0.0000 ug/min | INTRAVENOUS | Status: DC
  Filled 2014-04-06: qty 4

## 2014-04-06 MED ORDER — SODIUM CHLORIDE 0.9 % IV SOLN
250.0000 mL | INTRAVENOUS | Status: DC
Start: 2014-04-07 — End: 2014-04-10

## 2014-04-06 MED ORDER — SODIUM CHLORIDE 0.45 % IV SOLN
INTRAVENOUS | Status: DC
  Administered 2014-04-06: 04:00:00 via INTRAVENOUS

## 2014-04-06 MED ORDER — SODIUM BICARBONATE 8.4 % IV SOLN
INTRAVENOUS | Status: DC | PRN
Start: 2014-04-06 — End: 2014-04-06
  Administered 2014-04-06: 100 meq via INTRAVENOUS

## 2014-04-06 MED ORDER — BISACODYL 5 MG PO TBEC
10.0000 mg | DELAYED_RELEASE_TABLET | Freq: Every day | ORAL | Status: DC
  Administered 2014-04-07 – 2014-04-10 (×4): 10 mg via ORAL
  Filled 2014-04-06 (×4): qty 2

## 2014-04-06 MED ORDER — NOREPINEPHRINE BITARTRATE 1 MG/ML IV SOLN
0.0000 ug/min | INTRAVENOUS | Status: DC
  Administered 2014-04-07: 5 ug/min via INTRAVENOUS
  Filled 2014-04-06: qty 16

## 2014-04-06 MED ORDER — ACETAMINOPHEN 500 MG PO TABS
1000.0000 mg | ORAL_TABLET | Freq: Four times a day (QID) | ORAL | Status: DC
  Administered 2014-04-07 – 2014-04-09 (×9): 1000 mg via ORAL
  Filled 2014-04-06 (×18): qty 2

## 2014-04-06 MED ORDER — VANCOMYCIN HCL IN DEXTROSE 1-5 GM/200ML-% IV SOLN
INTRAVENOUS | Status: AC
  Filled 2014-04-06: qty 200

## 2014-04-06 MED ORDER — NITROGLYCERIN IN D5W 200-5 MCG/ML-% IV SOLN: 0.0000 ug/min | INTRAVENOUS | Status: DC

## 2014-04-06 MED ORDER — MILRINONE IN DEXTROSE 20 MG/100ML IV SOLN
0.3000 ug/kg/min | INTRAVENOUS | Status: DC
  Administered 2014-04-06 (×2): 0.3 ug/kg/min via INTRAVENOUS
  Filled 2014-04-06 (×2): qty 100

## 2014-04-06 MED ORDER — ROCURONIUM BROMIDE 100 MG/10ML IV SOLN
INTRAVENOUS | Status: DC | PRN
  Administered 2014-04-06: 50 mg via INTRAVENOUS

## 2014-04-06 MED ORDER — VASOPRESSIN 20 UNIT/ML IJ SOLN
0.0000 [IU]/min | INTRAVENOUS | Status: DC
  Administered 2014-04-06: 0.03 [IU]/min via INTRAVENOUS
  Filled 2014-04-06: qty 2

## 2014-04-06 MED ORDER — ALBUMIN HUMAN 5 % IV SOLN: 250.0000 mL | INTRAVENOUS | Status: AC | PRN

## 2014-04-06 MED ORDER — MIDAZOLAM HCL 2 MG/2ML IJ SOLN
2.0000 mg | INTRAMUSCULAR | Status: DC | PRN
  Administered 2014-04-06: 2 mg via INTRAVENOUS
  Filled 2014-04-06: qty 2

## 2014-04-06 MED ORDER — SODIUM CHLORIDE 0.9 % IJ SOLN
3.0000 mL | Freq: Two times a day (BID) | INTRAMUSCULAR | Status: DC
  Administered 2014-04-07 – 2014-04-08 (×3): 3 mL via INTRAVENOUS

## 2014-04-06 MED ORDER — MAGNESIUM SULFATE 4000MG/100ML IJ SOLN: 4.0000 g | Freq: Once | INTRAMUSCULAR | Status: DC

## 2014-04-06 MED ORDER — INSULIN REGULAR BOLUS VIA INFUSION
0.0000 [IU] | Freq: Three times a day (TID) | INTRAVENOUS | Status: DC
  Filled 2014-04-06: qty 10

## 2014-04-06 MED ORDER — DOCUSATE SODIUM 100 MG PO CAPS
200.0000 mg | ORAL_CAPSULE | Freq: Every day | ORAL | Status: DC
  Administered 2014-04-07 – 2014-04-10 (×4): 200 mg via ORAL
  Filled 2014-04-06 (×4): qty 2

## 2014-04-06 MED ORDER — SODIUM CHLORIDE 0.9 % IV SOLN
INTRAVENOUS | Status: DC
  Administered 2014-04-06: 1.4 [IU]/h via INTRAVENOUS
  Administered 2014-04-07: 2.4 [IU]/h via INTRAVENOUS
  Filled 2014-04-06 (×2): qty 2.5

## 2014-04-06 MED ORDER — POTASSIUM CHLORIDE 10 MEQ/50ML IV SOLN: 10.0000 meq | INTRAVENOUS | Status: DC

## 2014-04-06 MED ORDER — PHENYLEPHRINE HCL 10 MG/ML IJ SOLN
10.0000 mg | INTRAVENOUS | Status: DC | PRN
  Administered 2014-04-06: 50 ug/min via INTRAVENOUS

## 2014-04-06 MED ORDER — SODIUM CHLORIDE 0.9 % IJ SOLN: 3.0000 mL | INTRAMUSCULAR | Status: DC | PRN

## 2014-04-06 MED ORDER — OXYCODONE HCL 5 MG PO TABS
5.0000 mg | ORAL_TABLET | ORAL | Status: DC | PRN
  Administered 2014-04-07 – 2014-04-09 (×6): 5 mg via ORAL
  Administered 2014-04-10 (×2): 10 mg via ORAL
  Filled 2014-04-06 (×2): qty 1
  Filled 2014-04-06 (×2): qty 2
  Filled 2014-04-06 (×4): qty 1

## 2014-04-06 MED ORDER — SODIUM BICARBONATE 8.4 % IV SOLN
50.0000 meq | Freq: Once | INTRAVENOUS | Status: AC
  Administered 2014-04-06: 50 meq via INTRAVENOUS

## 2014-04-06 MED ORDER — AMIODARONE HCL IN DEXTROSE 360-4.14 MG/200ML-% IV SOLN
30.0000 mg/h | INTRAVENOUS | Status: DC
  Administered 2014-04-06 – 2014-04-08 (×4): 30 mg/h via INTRAVENOUS
  Filled 2014-04-06 (×9): qty 200

## 2014-04-06 MED ORDER — PROPOFOL 10 MG/ML IV BOLUS
INTRAVENOUS | Status: DC | PRN
  Administered 2014-04-06 (×2): 50 mg via INTRAVENOUS

## 2014-04-06 MED ORDER — METOPROLOL TARTRATE 25 MG/10 ML ORAL SUSPENSION
12.5000 mg | Freq: Two times a day (BID) | ORAL | Status: DC
Start: 2014-04-06 — End: 2014-04-06
  Filled 2014-04-06 (×2): qty 5

## 2014-04-06 MED ORDER — ACETAMINOPHEN 160 MG/5ML PO SOLN
1000.0000 mg | Freq: Four times a day (QID) | ORAL | Status: DC
  Administered 2014-04-07 (×2): 1000 mg
  Filled 2014-04-06 (×2): qty 40.6

## 2014-04-06 MED ORDER — CHLORHEXIDINE GLUCONATE 0.12 % MT SOLN
15.0000 mL | Freq: Two times a day (BID) | OROMUCOSAL | Status: DC
  Administered 2014-04-06 – 2014-04-14 (×16): 15 mL via OROMUCOSAL
  Filled 2014-04-06 (×18): qty 15

## 2014-04-06 MED ORDER — ASPIRIN 81 MG PO CHEW: 324.0000 mg | CHEWABLE_TABLET | Freq: Every day | ORAL | Status: DC

## 2014-04-06 MED ORDER — PHENYLEPHRINE HCL 10 MG/ML IJ SOLN
0.0000 ug/min | INTRAVENOUS | Status: DC
  Filled 2014-04-06: qty 2

## 2014-04-06 MED ORDER — GELATIN ABSORBABLE MT POWD
OROMUCOSAL | Status: DC | PRN
  Administered 2014-04-06 (×3): via TOPICAL

## 2014-04-06 MED ORDER — MIDAZOLAM HCL 2 MG/2ML IJ SOLN
INTRAMUSCULAR | Status: DC | PRN
  Administered 2014-04-06 (×3): 2 mg via INTRAVENOUS
  Administered 2014-04-06: 4 mg via INTRAVENOUS

## 2014-04-06 MED ORDER — PANTOPRAZOLE SODIUM 40 MG PO TBEC
40.0000 mg | DELAYED_RELEASE_TABLET | Freq: Every day | ORAL | Status: DC
  Administered 2014-04-08 – 2014-04-10 (×3): 40 mg via ORAL
  Filled 2014-04-06 (×3): qty 1

## 2014-04-06 MED ORDER — ONDANSETRON HCL 4 MG/2ML IJ SOLN: 4.0000 mg | Freq: Four times a day (QID) | INTRAMUSCULAR | Status: DC | PRN

## 2014-04-06 MED ORDER — BISACODYL 10 MG RE SUPP: 10.0000 mg | Freq: Every day | RECTAL | Status: DC

## 2014-04-06 MED ORDER — 0.9 % SODIUM CHLORIDE (POUR BTL) OPTIME
TOPICAL | Status: DC | PRN
  Administered 2014-04-06: 5000 mL

## 2014-04-06 MED ORDER — SODIUM CHLORIDE 0.9 % IV SOLN
INTRAVENOUS | Status: DC | PRN
  Administered 2014-04-06 (×2): via INTRAVENOUS

## 2014-04-06 MED ORDER — FAMOTIDINE IN NACL 20-0.9 MG/50ML-% IV SOLN
20.0000 mg | Freq: Two times a day (BID) | INTRAVENOUS | Status: AC
  Administered 2014-04-06 (×2): 20 mg via INTRAVENOUS
  Filled 2014-04-06 (×2): qty 50

## 2014-04-06 MED ORDER — ASPIRIN EC 325 MG PO TBEC
325.0000 mg | DELAYED_RELEASE_TABLET | Freq: Every day | ORAL | Status: DC
  Administered 2014-04-07: 325 mg via ORAL
  Filled 2014-04-06 (×2): qty 1

## 2014-04-06 MED ORDER — VANCOMYCIN HCL 1000 MG IV SOLR
1000.0000 mg | INTRAVENOUS | Status: DC | PRN
  Administered 2014-04-06: 1000 mg via INTRAVENOUS

## 2014-04-06 MED ORDER — ACETAMINOPHEN 160 MG/5ML PO SOLN
650.0000 mg | Freq: Once | ORAL | Status: AC
  Administered 2014-04-06: 650 mg
  Filled 2014-04-06: qty 20.3

## 2014-04-06 MED ORDER — MORPHINE SULFATE 2 MG/ML IJ SOLN
2.0000 mg | INTRAMUSCULAR | Status: DC | PRN
Start: 2014-04-06 — End: 2014-04-07
  Administered 2014-04-06 (×3): 2 mg via INTRAVENOUS
  Filled 2014-04-06 (×4): qty 1

## 2014-04-06 MED ORDER — CETYLPYRIDINIUM CHLORIDE 0.05 % MT LIQD
7.0000 mL | Freq: Four times a day (QID) | OROMUCOSAL | Status: DC
  Administered 2014-04-06 – 2014-04-07 (×4): 7 mL via OROMUCOSAL

## 2014-04-06 MED ORDER — SUCCINYLCHOLINE CHLORIDE 20 MG/ML IJ SOLN
INTRAMUSCULAR | Status: DC | PRN
  Administered 2014-04-06: 40 mg via INTRAVENOUS
  Administered 2014-04-06: 60 mg via INTRAVENOUS
  Administered 2014-04-06: 100 mg via INTRAVENOUS

## 2014-04-06 MED ORDER — FENTANYL CITRATE 0.05 MG/ML IJ SOLN
INTRAMUSCULAR | Status: DC | PRN
  Administered 2014-04-06: 150 ug via INTRAVENOUS

## 2014-04-06 MED ORDER — METOPROLOL TARTRATE 1 MG/ML IV SOLN: 2.5000 mg | INTRAVENOUS | Status: DC | PRN

## 2014-04-06 MED ORDER — SODIUM CHLORIDE 0.9 % IV SOLN: INTRAVENOUS | Status: DC

## 2014-04-06 MED ORDER — SODIUM POLYSTYRENE SULFONATE 15 GM/60ML PO SUSP
60.0000 g | ORAL | Status: AC
  Administered 2014-04-06: 60 g
  Filled 2014-04-06: qty 240

## 2014-04-06 MED ORDER — DEXMEDETOMIDINE HCL IN NACL 200 MCG/50ML IV SOLN
0.1000 ug/kg/h | INTRAVENOUS | Status: DC
  Administered 2014-04-06: 0.6 ug/kg/h via INTRAVENOUS
  Administered 2014-04-06 (×5): 0.7 ug/kg/h via INTRAVENOUS
  Filled 2014-04-06 (×8): qty 50

## 2014-04-06 MED ORDER — ACETAMINOPHEN 650 MG RE SUPP
650.0000 mg | Freq: Once | RECTAL | Status: AC
Start: 2014-04-06 — End: 2014-04-06
  Filled 2014-04-06: qty 1

## 2014-04-06 MED FILL — Lidocaine HCl IV Inj 20 MG/ML: INTRAVENOUS | Qty: 5 | Status: AC

## 2014-04-06 MED FILL — Heparin Sodium (Porcine) Inj 1000 Unit/ML: INTRAMUSCULAR | Qty: 30 | Status: AC

## 2014-04-06 MED FILL — Magnesium Sulfate Inj 50%: INTRAMUSCULAR | Qty: 10 | Status: AC

## 2014-04-06 MED FILL — Sodium Chloride IV Soln 0.9%: INTRAVENOUS | Qty: 3000 | Status: AC

## 2014-04-06 MED FILL — Sodium Bicarbonate IV Soln 8.4%: INTRAVENOUS | Qty: 50 | Status: AC

## 2014-04-06 MED FILL — Heparin Sodium (Porcine) Inj 1000 Unit/ML: INTRAMUSCULAR | Qty: 60 | Status: AC

## 2014-04-06 MED FILL — Electrolyte-R (PH 7.4) Solution: INTRAVENOUS | Qty: 3000 | Status: AC

## 2014-04-06 MED FILL — Potassium Chloride Inj 2 mEq/ML: INTRAVENOUS | Qty: 40 | Status: AC

## 2014-04-06 NOTE — Transfer of Care (Signed)
Immediate Anesthesia Transfer of Care Note  Patient: Timothy Palmer  Procedure(s) Performed: Procedure(s): Bring back MINIMALLY INVASIVE MITRAL VALVE (MV) REPLACEMENT - Reexploration for bleeding (Right)  Patient Location: SICU  Anesthesia Type:General  Level of Consciousness: Patient remains intubated per anesthesia plan  Airway & Oxygen Therapy: Patient remains intubated per anesthesia plan and Patient placed on Ventilator (see vital sign flow sheet for setting)  Post-op Assessment: Report given to PACU RN and Post -op Vital signs reviewed and stable  Post vital signs: Reviewed and stable  Complications: No apparent anesthesia complications

## 2014-04-06 NOTE — Anesthesia Postprocedure Evaluation (Signed)
  Anesthesia Post-op Note  Patient: Timothy Palmer  Procedure(s) Performed: Procedure(s): Bring back MINIMALLY INVASIVE MITRAL VALVE (MV) REPLACEMENT - Reexploration for bleeding (Right)  Patient Location: SICU  Anesthesia Type:General  Level of Consciousness: sedated and Patient remains intubated per anesthesia plan  Airway and Oxygen Therapy: Patient remains intubated per anesthesia plan  Post-op Pain: none  Post-op Assessment: Post-op Vital signs reviewed, PATIENT'S CARDIOVASCULAR STATUS UNSTABLE and Pain level controlled  Post-op Vital Signs: Reviewed  Last Vitals:  Filed Vitals:   04/06/14 0615  BP:   Pulse: 79  Temp: 36.1 C  Resp: 18    Complications: No apparent anesthesia complications

## 2014-04-06 NOTE — Progress Notes (Signed)
~  2230: Pt extubated to 4L nasal canula.    ~2235: RN noticed that chest tube output had increased with large amount of dark red, clotty blood.  Assessed sahara, initial dump of about 661ml of blood.  Pt's BP low, fluids started and pressors increased.  Pt asked about how he is feeling, pt is slightly drowsy but able to clearly answer questions.  Pt says he is ok, except for being a little hot.  Dr. Roxy Manns paged.  Stat co-ags draw and sent to lab.  ~2240: Dr. Roxy Manns made aware of increased output from chest tubes.  Orders received for stat chest x-ray and 2 units of PRBCs from blood bank.    ~2245: Portable x-ray at bedside.  ~2255: Dr. Roxy Manns re-paged d/t preliminary results of chest x-ray.  Dr. Roxy Manns made aware of chest x-ray as well as results of co-ags.  Orders received for 2 units of FFP and that he would be coming to the hospital.  ~2320: Dr. Roxy Manns at bedside to assess pt.  Orders received for an additional 2 units of PRBCs and that pt would be going back to the OR.  ~2345: CRNAs at bedside for report.  Pt was transferred to OR by RN, OR staff and Dr. Roxy Manns with no issues.

## 2014-04-06 NOTE — Brief Op Note (Signed)
03/22/2014 - 04/06/2014  2:41 AM  PATIENT:  Timothy Palmer  48 y.o. male  PRE-OPERATIVE DIAGNOSIS:  Post op Bleeding  POST-OPERATIVE DIAGNOSIS:  Post op Bleeding  PROCEDURE:  Procedure(s): Bring back MINIMALLY INVASIVE MITRAL VALVE (MV) REPLACEMENT - Reexploration for bleeding (Right)  SURGEON:   Rexene Alberts, MD   ANESTHESIA:   Lorrene Reid, MD  EBL:  Total I/O In: [id number] [I.V.:1373.8; DS:1845521; NG/GT:30; IV Piggyback:1100] Out: 1660 [Urine:30; Emesis/NG output:200; Chest Tube:1430]  BLOOD ADMINISTERED: 2 units PRBC's + 2 units FFP  FINDINGS:   Difficult intubation Bleeding from right atrium at site from previous retrograde cannula incision  DISP:  To SICU in stable condition  [name],[name] 04/06/2014 2:45 AM

## 2014-04-06 NOTE — Progress Notes (Signed)
Patient ID: Timothy Palmer, male   DOB: [birth year], 48 y.o.   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 3 ESRD on HD - plan for dialysis later today (will assess later this afternoon)- potassium of 5.4 noted. Dialysis will be heparin free and with minimal UF especially if still on pressors 4 HTN blood pressures being supported with pressors while on milrinone and amiodarone 5 Anemia hgb 9.2 aranesp at 40/wk-s/p PRBCs and FFP 6 HPTH cont sensipar, renvela after extubated and able to take PO  Subjective:   Mitral valve repair earlier yesterday and chest re-exploration done overnight for increased chest tube output after extubation with consequent repair of RA retrograde cannulation site.   Objective:   BP 88/60  Pulse 92  Temp(Src) 97.9 F (36.6 C) (Core (Comment))  Resp 25  Ht 5\' 11"  (1.803 m)  Wt 111.2 kg (245 lb 2.4 oz)  BMI 34.21 kg/m2  SpO2 100%  Physical Exam: Gen: Intubated/sedated on pressors/inotrope CVS: Pulse regular in rate and rhythm, distant S1 and S2 Resp: Clear to auscultation bilaterally, no rales/rhonchi Abd: Soft, flat, nontender Ext: Trace left lower extremity edema, no right lower extremity edema. Improving surgical wounds left leg. Left radiocephalic fistula with well-formed buttonhole cannulation zones   Labs: BMET  Recent Labs Lab 03/31/14 0713 04/01/14 0405 04/02/14 0445 04/02/14 0716 04/04/14 0415 04/04/14 1105 04/05/14 0419  04/05/14 1244  04/05/14 1430 04/05/14 1515 04/05/14 1602 04/05/14 2130 04/06/14 0105 04/06/14 0220  04/06/14 0328 04/06/14 0600  NA 136* 140 139 139 138  --  137  < > 135*  --  135* 135* 136* 133* 134* 132* 132* 136*  K 3.9 4.4 4.1 4.2 4.4  --  4.4  < > 3.4*  --  3.7 4.1 4.4 4.3 5.4* 6.0* 6.6* 5.4*  CL 95* 95* 95* 96 95*  --  94*  < > 101  --  104 105  --  106 105 104  --  101  CO2 26 28 26 23 26   --  29  --   --   --   --   --   --   --   --   --   --  20  GLUCOSE 101* 98 93 94 90  --  89  < > 202*  < > 225* 172* 135* 112* 150* 232* 219* 177*  BUN 34* 23 33* 31* 32*  --  19  < > 21  --  20 20  --  23 25* 25*  --  27*  CREATININE 9.03* 6.80* 9.56* 9.30* 10.00*  --  7.01*  < > 7.20*  --  6.50* 6.70*  --  6.99*  7.90* 6.90* 6.90*  --  6.87*  CALCIUM 11.0* 10.9* 11.3* 10.5 10.6*  --  10.6*  --   --   --   --   --   --   --   --   --   --  9.3  PHOS 6.4* 5.0* 7.3* 6.3*  --  6.9*  --   --   --   --   --   --   --   --   --   --   --   --   < > =  values in this interval not displayed. CBC  Recent Labs Lab 04/05/14 1600  04/05/14 2130 04/05/14 2239 04/06/14 0105 04/06/14 0220 04/06/14 0328 04/06/14 0340  WBC 36.1*  --  28.5* 25.2*  --   --   --  24.1*  HGB 8.9*  < > 8.6*  8.8* 7.7* 9.2* 10.9* 10.9* 9.9*  HCT 27.3*  < > 25.7*  26.0* 23.2* 27.0* 32.0* 32.0* 29.5*  MCV 96.1  --  91.1 91.3  --   --   --  90.5  PLT 222  --  189 165  --   --   --  96*  < > = values in this interval not displayed. Medications:    . [START ON 04/07/2014] acetaminophen  1,000 mg Oral 4 times per day   Or  . [START ON 04/07/2014] acetaminophen (TYLENOL) oral liquid 160 mg/5 mL  1,000 mg Per Tube 4 times per day  . acetaminophen (TYLENOL) oral liquid 160 mg/5 mL  650 mg Per Tube Once   Or  . acetaminophen  650 mg Rectal Once  . [START ON 04/07/2014] aspirin EC  325 mg Oral Daily   Or  . [START ON 04/07/2014] aspirin  324 mg Per Tube Daily  . [START ON 04/07/2014] bisacodyl  10 mg Oral Daily   Or  . [START ON 04/07/2014] bisacodyl  10 mg Rectal Daily  .  ceFAZolin (ANCEF) IV  2 g Intravenous Q T,Th,Sat-1800   . chlorhexidine  15 mL Mouth/Throat BID  . darbepoetin (ARANESP) injection - DIALYSIS  40 mcg Intravenous Q Thu-HD  . [START ON 04/07/2014] docusate sodium  200 mg Oral Daily  . famotidine (PEPCID) IV  20 mg Intravenous Q12H  . insulin regular  0-10 Units Intravenous TID WC  . metoprolol tartrate  12.5 mg Oral BID   Or  . metoprolol tartrate  12.5 mg Per Tube BID  . [START ON 04/08/2014] pantoprazole  40 mg Oral Daily  . [START ON 04/07/2014] sodium chloride  3 mL Intravenous Q12H   Elmarie Shiley, MD 04/06/2014, 8:04 AM

## 2014-04-06 NOTE — Progress Notes (Signed)
CRITICAL VALUE ALERT  Critical value received: Istat potassium  Date of notification:  04/06/14  Time of notification:  0328  Nurse who received alert:  Archie Endo, RN  MD notified: Dr. Roxy Manns at bedside notified.

## 2014-04-06 NOTE — Progress Notes (Signed)
POD # 1 mitral repair, POD 0 re-exploration for bleeding  Intubated and sedated  BP 115/62  Pulse 80  Temp(Src) 99.5 F (37.5 C) (Core (Comment))  Resp 18  Ht 5\' 11"  (1.803 m)  Wt 245 lb 2.4 oz (111.2 kg)  BMI 34.21 kg/m2  SpO2 100%  38/21 CI= 2.6  IMV 18/ 40%/ 5 Peep- 100 % O2 sat   Intake/Output Summary (Last 24 hours) at 04/06/14 1717 Last data filed at 04/06/14 1700  Gross per 24 hour  Intake 8006.16 ml  Output   1986 ml  Net 6020.16 ml    Stable  Plan is to keep intubated overnight and wean towards extubation in AM

## 2014-04-06 NOTE — Progress Notes (Signed)
[address]       [place] [address]             [phone]        CARDIOTHORACIC SURGERY PROGRESS NOTE  R1 Day Post-Op Procedure(s) (LRB):  MINIMALLY INVASIVE MITRAL VALVE REPAIR (MVR) (Right)  INTRAOPERATIVE TRANSESOPHAGEAL ECHOCARDIOGRAM (N/A)    R1 Day Post-Op Procedure(s) (LRB): Bring back MINIMALLY INVASIVE MITRAL VALVE (MV) REPLACEMENT - Reexploration for bleeding (Right)  Subjective: Starting to wake up but still sedated on vent  Objective: Vital signs: BP Readings from Last 1 Encounters:  04/06/14 87/57   Pulse Readings from Last 1 Encounters:  04/06/14 90   Resp Readings from Last 1 Encounters:  04/06/14 18   Temp Readings from Last 1 Encounters:  04/06/14 97.9 F (36.6 C)     Hemodynamics: PAP: (25-49)/(13-25) 32/23 mmHg CO:  [4.4 L/min-7.6 L/min] 5.5 L/min CI:  [2 L/min/m2-3.5 L/min/m2] 2.6 L/min/m2  Physical Exam:  Rhythm:   NSR - AAI paced  Breath sounds: clear  Heart sounds:  RRR w/out murmur  Incisions:  Dressings dry, intact  Abdomen:  Soft, non-distended  Extremities:  Warm, well-perfused    Intake/Output from previous day: 09/29 0701 - 09/30 0700 In: 10582.9 [I.V.:5330.9; FO:[id number]; NG/GT:60; IV Piggyback:1450] Out: [id number] [Urine:180; Emesis/NG output:200; Blood:1525; Chest Tube:1680] Intake/Output this shift: Total I/O In: 99.6 [I.V.:99.6] Out: 0   Lab Results:  CBC: Recent Labs  04/05/14 2239  04/06/14 0328 04/06/14 0340  WBC 25.2*  --   --  24.1*  HGB 7.7*  < > 10.9* 9.9*  HCT 23.2*  < > 32.0* 29.5*  PLT 165  --   --  96*  < > = values in this interval not displayed.  BMET:  Recent Labs  04/05/14 0419  04/06/14 0220 04/06/14 0328 04/06/14 0600  NA 137  < > 132* 132* 136*  K 4.4  < > 6.0* 6.6* 5.4*  CL 94*  < > 104  --  101  CO2 29  --   --   --  20  GLUCOSE 89  < > 232* 219* 177*  BUN 19  < > 25*  --  27*  CREATININE 7.01*  < > 6.90*  --  6.87*  CALCIUM 10.6*  --   --   --  9.3  < > =  values in this interval not displayed.   CBG (last 3)   Recent Labs  04/05/14 2058 04/05/14 2151 04/05/14 2309  GLUCAP 105* 112* 99    ABG    Component Value Date/Time   PHART 7.451* 04/06/2014 0524   PCO2ART 32.6* 04/06/2014 0524   PO2ART 218.0* 04/06/2014 0524   HCO3 23.0 04/06/2014 0524   TCO2 24 04/06/2014 0524   ACIDBASEDEF 1.0 04/06/2014 0524   O2SAT 100.0 04/06/2014 0524    CXR: PORTABLE CHEST - 1 VIEW  COMPARISON: 04/05/2014  FINDINGS:  Endotracheal tube tip measures 3.2 cm above the carinal. Enteric  tube tip is off the field of view but below the left hemidiaphragm  consistent with location at least in the stomach. Left Swan-Ganz  catheter with tip over the pulmonary outflow tract. Right chest  tubes. No visible pneumothorax. Shallow inspiration with elevation  of right hemidiaphragm. Infiltration or atelectasis in the right  upper lung is improved since previous study. No pneumothorax.  IMPRESSION:  Appliances appear to be in satisfactory location. Improving  infiltration or atelectasis in the right upper lung.  Electronically Signed  By: Lucienne Capers M.D.  On: 04/06/2014 04:00    Assessment/Plan:  Overall stable POD1 Maintaining NSR/AAI paced rhythm w/ stable hemodynamics and relatively low PA pressures, although currently on Levophed @ 10, vasopressin @ 0.3 and milrinone @ 0.3 Good oxygenation and CXR improved Hyperkalemia improved after kaexylate but K+ still 5.4 Expected post op acute blood loss anemia, stable, improved Expected post op volume excess   I favor HD at some point today for UF to treat hyperkalemia given the amount of blood products given overnight  I doubt that he would tolerate an attempt to remove much volume at this point  Keep intubated for now and consider vent wean after HD or tomorrow  Keep HOB elevated to decrease swelling  Wean levophed preferentially as tolerated   Maze Corniel H 04/06/2014 8:27 AM

## 2014-04-06 NOTE — Progress Notes (Signed)
Recruitment maneuver performed per Dr Roxy Manns.  PC 35, RR 10, 100%, PEEP 5, Ti 3.00 for 2 minutes.  Tolerated well.

## 2014-04-06 NOTE — Op Note (Addendum)
CARDIOTHORACIC SURGERY OPERATIVE NOTE  Date of Procedure:   04/06/2014  Preoperative Diagnosis:  Bleeding s/p minimally-invasive mitral valve repair  Postoperative Diagnosis:  same  Procedure:    Reexploration of chest for bleeding  Surgeon:    Valentina Gu. Roxy Manns, MD  Anesthesia:    Lorrene Reid, MD  Operative Findings:   Difficult intubation  Bleeding from right atrium at site from previous retrograde cannula insertion easily controlled with suture repair     BRIEF CLINICAL NOTE AND INDICATIONS FOR SURGERY  Patient underwent uncomplicated minimally-invasive mitral valve repair on 04/05/2014 and was transported to the SICU in stable condition.  His early postoperative recovery was entirely uneventful with stable hemodynamics and minimal chest tube output. He was extubated without difficulty approximately 6 hours after arrival in the SICU. Shortly after extubation he began coughing forcefully, and subsequently he developed sudden onset dramatically increased chest tube output with dark looking blood via chest tubes. Initially his BP dropped but it has rebounded with volume administration.  He put out a total of more than 1000 mL of dark blood.  The patient was counseled regarding the need for reexploration of his chest to remove any remaining blood and potentially repair any surgical source of bleeding.  The patient provides verbal consent for the procedure as described, and after initial volume resuscitation he remained entirely stable during transport to the operating room.      DETAILS OF THE OPERATIVE PROCEDURE  The patient is brought to the operating room on the above mentioned date and placed in the supine position on the operating table.  General endotracheal anesthesia is induced under the care and direction of Dr. Al Corpus.  The patient was noted to have somewhat difficult anatomy for intubation because of his body habitus and swelling of the tongue. Intubation took several attempts  and was ultimately performed by one of the anesthetists. The patient was intubated using a single lumen endotracheal tube.  The patient's anterior chest, abdomen, and both groins were prepaired and draped in a sterile manner.  A time out procedure is performed. The patient's previous right miniature thoracotomy incision is reopened. The right chest was explored. There is old blood located medially between the right middle lobe and the pericardial sac with signs of ongoing bleeding. The thoracotomy incision is extended medially to facilitate exposure.  The lung is retracted laterally and the patch closure of the pericardium is reopened. It is immediately apparent that there is a hole in the lateral wall of the right atrium. The location of the whole coincides with the location in the right atrial wall where retrograde cardioplegia cannula was placed at the time of the previous operation.  Once the pericardial space is reopened bleeding accelerates. Direct pressure is applied to the hole to control bleeding all sutures are placed in the free edge of the pericardium to be utilized for retraction for exposure. The hole in the pericardium is subsequently repaired using pledgeted 3-0 Prolene suture. At this juncture the remaining blood in the pericardial sac and right pleural space is evacuated. The right pleural space and pericardial space are irrigated with copious saline solution. A careful search for other sites of bleeding is performed, and no other signs of bleeding is encountered.  The pericardial patch closure is loosely reapplied and the thoracotomy incision closed in multiple layers in routine fashion.  The right pleural space and pericardial sac are drained using the original chest tubes placed at the time of the patient's previous operation. The patient  tolerated the procedure remarkably well and is transported back to the surgical intensive care unit intubated, sedated, in stable condition. The patient  was transfused a total of 3 units packed red blood cells and 2 units fresh frozen plasma intraoperatively. All sponge instrument and needle counts are verified correct at completion the operation.     Valentina Gu. Roxy Manns MD 04/06/2014 2:51 AM

## 2014-04-06 NOTE — Anesthesia Preprocedure Evaluation (Signed)
Anesthesia Evaluation  Patient identified by MRN, date of birth, ID band Patient awake    Reviewed: Allergy & Precautions, H&P , NPO status , Patient's Chart, lab work & pertinent test results, Unable to perform ROS - Chart review only  Airway Mallampati: III TM Distance: >3 FB Neck ROM: Full  Mouth opening: Limited Mouth Opening  Dental  (+) Teeth Intact, Dental Advisory Given   Pulmonary  breath sounds clear to auscultation        Cardiovascular hypertension, Pt. on medications Rhythm:Regular Rate:Normal     Neuro/Psych    GI/Hepatic   Endo/Other    Renal/GU DialysisRenal disease     Musculoskeletal   Abdominal   Peds  Hematology   Anesthesia Other Findings Pt bleeding from chest post op.  Reproductive/Obstetrics                           Anesthesia Physical Anesthesia Plan  ASA: IV and emergent  Anesthesia Plan: General   Post-op Pain Management:    Induction: Intravenous, Rapid sequence and Cricoid pressure planned  Airway Management Planned: Oral ETT  Additional Equipment:   Intra-op Plan:   Post-operative Plan: Post-operative intubation/ventilation  Informed Consent: I have reviewed the patients History and Physical, chart, labs and discussed the procedure including the risks, benefits and alternatives for the proposed anesthesia with the patient or authorized representative who has indicated his/her understanding and acceptance.   Dental advisory given  Plan Discussed with: CRNA, Anesthesiologist and Surgeon  Anesthesia Plan Comments:         Anesthesia Quick Evaluation

## 2014-04-07 ENCOUNTER — Inpatient Hospital Stay (HOSPITAL_COMMUNITY): Payer: Medicare Other

## 2014-04-07 DIAGNOSIS — I509 Heart failure, unspecified: Secondary | ICD-10-CM

## 2014-04-07 LAB — GLUCOSE, CAPILLARY
GLUCOSE-CAPILLARY: 108 mg/dL — AB (ref 70–99)
GLUCOSE-CAPILLARY: 121 mg/dL — AB (ref 70–99)
GLUCOSE-CAPILLARY: 126 mg/dL — AB (ref 70–99)
GLUCOSE-CAPILLARY: 128 mg/dL — AB (ref 70–99)
GLUCOSE-CAPILLARY: 132 mg/dL — AB (ref 70–99)
GLUCOSE-CAPILLARY: 90 mg/dL (ref 70–99)
GLUCOSE-CAPILLARY: 98 mg/dL (ref 70–99)
Glucose-Capillary: 100 mg/dL — ABNORMAL HIGH (ref 70–99)
Glucose-Capillary: 103 mg/dL — ABNORMAL HIGH (ref 70–99)
Glucose-Capillary: 113 mg/dL — ABNORMAL HIGH (ref 70–99)
Glucose-Capillary: 116 mg/dL — ABNORMAL HIGH (ref 70–99)
Glucose-Capillary: 118 mg/dL — ABNORMAL HIGH (ref 70–99)
Glucose-Capillary: 121 mg/dL — ABNORMAL HIGH (ref 70–99)
Glucose-Capillary: 122 mg/dL — ABNORMAL HIGH (ref 70–99)
Glucose-Capillary: 123 mg/dL — ABNORMAL HIGH (ref 70–99)
Glucose-Capillary: 124 mg/dL — ABNORMAL HIGH (ref 70–99)
Glucose-Capillary: 126 mg/dL — ABNORMAL HIGH (ref 70–99)
Glucose-Capillary: 134 mg/dL — ABNORMAL HIGH (ref 70–99)
Glucose-Capillary: 135 mg/dL — ABNORMAL HIGH (ref 70–99)
Glucose-Capillary: 137 mg/dL — ABNORMAL HIGH (ref 70–99)
Glucose-Capillary: 140 mg/dL — ABNORMAL HIGH (ref 70–99)
Glucose-Capillary: 92 mg/dL (ref 70–99)
Glucose-Capillary: 98 mg/dL (ref 70–99)

## 2014-04-07 LAB — PREPARE FRESH FROZEN PLASMA
Unit division: 0
Unit division: 0

## 2014-04-07 LAB — POCT I-STAT, CHEM 8
BUN: 12 mg/dL (ref 6–23)
CALCIUM ION: 1.32 mmol/L — AB (ref 1.12–1.23)
Chloride: 101 mEq/L (ref 96–112)
Creatinine, Ser: 3.6 mg/dL — ABNORMAL HIGH (ref 0.50–1.35)
Glucose, Bld: 112 mg/dL — ABNORMAL HIGH (ref 70–99)
HEMATOCRIT: 26 % — AB (ref 39.0–52.0)
HEMOGLOBIN: 8.8 g/dL — AB (ref 13.0–17.0)
Potassium: 4.2 mEq/L (ref 3.7–5.3)
SODIUM: 137 meq/L (ref 137–147)
TCO2: 26 mmol/L (ref 0–100)

## 2014-04-07 LAB — COMPREHENSIVE METABOLIC PANEL
ALK PHOS: 53 U/L (ref 39–117)
ALT: <5 U/L (ref 0–53)
ANION GAP: 12 (ref 5–15)
AST: 38 U/L — ABNORMAL HIGH (ref 0–37)
Albumin: 2 g/dL — ABNORMAL LOW (ref 3.5–5.2)
BILIRUBIN TOTAL: 0.3 mg/dL (ref 0.3–1.2)
BUN: 19 mg/dL (ref 6–23)
CHLORIDE: 100 meq/L (ref 96–112)
CO2: 26 meq/L (ref 19–32)
CREATININE: 5.61 mg/dL — AB (ref 0.50–1.35)
Calcium: 9.7 mg/dL (ref 8.4–10.5)
GFR calc Af Amer: 13 mL/min — ABNORMAL LOW (ref >90)
GFR, EST NON AFRICAN AMERICAN: 11 mL/min — AB (ref >90)
Glucose, Bld: 121 mg/dL — ABNORMAL HIGH (ref 70–99)
Potassium: 3.9 mEq/L (ref 3.7–5.3)
Sodium: 138 mEq/L (ref 137–147)
Total Protein: 5.1 g/dL — ABNORMAL LOW (ref 6.0–8.3)

## 2014-04-07 LAB — POCT I-STAT 3, ART BLOOD GAS (G3+)
ACID-BASE EXCESS: 5 mmol/L — AB (ref 0.0–2.0)
Acid-Base Excess: 6 mmol/L — ABNORMAL HIGH (ref 0.0–2.0)
BICARBONATE: 28.9 meq/L — AB (ref 20.0–24.0)
Bicarbonate: 30.2 mEq/L — ABNORMAL HIGH (ref 20.0–24.0)
O2 Saturation: 99 %
O2 Saturation: 99 %
PH ART: 7.472 — AB (ref 7.350–7.450)
PO2 ART: 133 mmHg — AB (ref 80.0–100.0)
TCO2: 30 mmol/L (ref 0–100)
TCO2: 31 mmol/L (ref 0–100)
pCO2 arterial: 39.6 mmHg (ref 35.0–45.0)
pCO2 arterial: 40.4 mmHg (ref 35.0–45.0)
pH, Arterial: 7.482 — ABNORMAL HIGH (ref 7.350–7.450)
pO2, Arterial: 141 mmHg — ABNORMAL HIGH (ref 80.0–100.0)

## 2014-04-07 LAB — CBC
HCT: 23.3 % — ABNORMAL LOW (ref 39.0–52.0)
HCT: 25.4 % — ABNORMAL LOW (ref 39.0–52.0)
Hemoglobin: 8.3 g/dL — ABNORMAL LOW (ref 13.0–17.0)
Hemoglobin: 8.9 g/dL — ABNORMAL LOW (ref 13.0–17.0)
MCH: 30 pg (ref 26.0–34.0)
MCH: 30.2 pg (ref 26.0–34.0)
MCHC: 35.6 g/dL (ref 30.0–36.0)
MCHC: 35 g/dL (ref 30.0–36.0)
MCV: 84.1 fL (ref 78.0–100.0)
MCV: 86.1 fL (ref 78.0–100.0)
PLATELETS: 71 10*3/uL — AB (ref 150–400)
Platelets: 70 10*3/uL — ABNORMAL LOW (ref 150–400)
RBC: 2.77 MIL/uL — ABNORMAL LOW (ref 4.22–5.81)
RBC: 2.95 MIL/uL — ABNORMAL LOW (ref 4.22–5.81)
RDW: 15.5 % (ref 11.5–15.5)
RDW: 15.9 % — AB (ref 11.5–15.5)
WBC: 14.5 10*3/uL — AB (ref 4.0–10.5)
WBC: 17.4 10*3/uL — ABNORMAL HIGH (ref 4.0–10.5)

## 2014-04-07 LAB — BLOOD GAS, ARTERIAL
Acid-Base Excess: 3.4 mmol/L — ABNORMAL HIGH (ref 0.0–2.0)
BICARBONATE: 25.4 meq/L — AB (ref 20.0–24.0)
Drawn by: 36496
FIO2: 0.4 %
MECHVT: 720 mL
O2 SAT: 98.7 %
PEEP: 5 cmH2O
Patient temperature: 98.6
RATE: 18 resp/min
TCO2: 26.2 mmol/L (ref 0–100)
pCO2 arterial: 26 mmHg — ABNORMAL LOW (ref 35.0–45.0)
pH, Arterial: 7.596 — ABNORMAL HIGH (ref 7.350–7.450)
pO2, Arterial: 113 mmHg — ABNORMAL HIGH (ref 80.0–100.0)

## 2014-04-07 LAB — WOUND CULTURE: Culture: NO GROWTH

## 2014-04-07 LAB — CREATININE, SERUM
Creatinine, Ser: 3.78 mg/dL — ABNORMAL HIGH (ref 0.50–1.35)
GFR calc Af Amer: 21 mL/min — ABNORMAL LOW (ref >90)
GFR, EST NON AFRICAN AMERICAN: 18 mL/min — AB (ref >90)

## 2014-04-07 LAB — MAGNESIUM: MAGNESIUM: 1.8 mg/dL (ref 1.5–2.5)

## 2014-04-07 MED ORDER — DARBEPOETIN ALFA-POLYSORBATE 40 MCG/0.4ML IJ SOLN
INTRAMUSCULAR | Status: AC
  Administered 2014-04-07: 40 ug
  Filled 2014-04-07: qty 0.4

## 2014-04-07 MED ORDER — MILRINONE IN DEXTROSE 20 MG/100ML IV SOLN: 0.0000 ug/kg/min | INTRAVENOUS | Status: DC

## 2014-04-07 MED ORDER — MORPHINE SULFATE 2 MG/ML IJ SOLN
2.0000 mg | INTRAMUSCULAR | Status: DC | PRN
  Administered 2014-04-07 – 2014-04-08 (×2): 2 mg via INTRAVENOUS
  Filled 2014-04-07 (×2): qty 1

## 2014-04-07 MED ORDER — BOOST / RESOURCE BREEZE PO LIQD
1.0000 | Freq: Three times a day (TID) | ORAL | Status: DC
  Administered 2014-04-08 – 2014-04-13 (×10): 1 via ORAL
  Administered 2014-04-14: 20:00:00 via ORAL
  Administered 2014-04-15: 1 via ORAL

## 2014-04-07 MED ORDER — INSULIN ASPART 100 UNIT/ML ~~LOC~~ SOLN: 0.0000 [IU] | SUBCUTANEOUS | Status: DC

## 2014-04-07 MED ORDER — SODIUM CHLORIDE 0.9 % IV SOLN: INTRAVENOUS | Status: DC

## 2014-04-07 MED ORDER — SODIUM CHLORIDE 0.45 % IV SOLN
INTRAVENOUS | Status: DC
  Administered 2014-04-07: 10:00:00 via INTRAVENOUS

## 2014-04-07 MED ORDER — INSULIN DETEMIR 100 UNIT/ML ~~LOC~~ SOLN
20.0000 [IU] | Freq: Two times a day (BID) | SUBCUTANEOUS | Status: DC
  Administered 2014-04-07 (×2): 20 [IU] via SUBCUTANEOUS
  Filled 2014-04-07 (×3): qty 0.2

## 2014-04-07 NOTE — Progress Notes (Signed)
Patient ID: Timothy Palmer, male   DOB: 05-May-1966, 48 y.o.   MRN: [id number]  [name] Progress Note   Assessment/ Plan:   1 MV endocarditis / MSSA bacteremia- on IV Ancef- for 6 weeks, s/p minimally invasive MV repair (valvuloplasty with pericardial patch repair of posterior leaflet) with RA retrograde cannulation site repair following bleed post-extubation. 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 3 ESRD on HD - Plan for HD again today with goal of ultrafiltration (may be limited by pressors- still remains on vasopressin and levophed). Heparin free. 4 HTN blood pressures being supported with pressors while on milrinone and amiodarone 5 Anemia hgb 8.3 aranesp at 40/wk-s/p PRBCs and FFP for ABLA post-op 6 HPTH cont sensipar, renvela after extubated and able to take PO  Subjective:   Extubated earlier today- no acute events overnight.   Objective:   BP 106/57  Pulse 78  Temp(Src) 98.8 F (37.1 C) (Core (Comment))  Resp 23  Ht 5\' 11"  (1.803 m)  Wt 111.2 kg (245 lb 2.4 oz)  BMI 34.21 kg/m2  SpO2 99%  Physical Exam: [id number] and alert on pressors/inotrope CVS: Pulse regular in rate and rhythm, distant S1 and S2 Resp: Coarse bilaterally, no distinct rales/rhonchi Abd: Soft, flat, nontender Ext: 1+ lower extremity edema, trace-1+ upper extremity edema. Surgical wounds left leg. Left radiocephalic fistula with well-formed buttonhole cannulation zones   Labs: BMET  Recent Labs Lab 04/01/14 0405 04/02/14 0445 04/02/14 0716 04/04/14 0415 04/04/14 1105 04/05/14 0419  04/05/14 1515  04/05/14 2130 04/06/14 0105 04/06/14 0220 04/06/14 0328 04/06/14 0600 04/06/14 1620 04/06/14 1700 04/07/14 0415  NA 140 139 139 138  --  137  < > 135*  < > 133* 134* 132* 132* 136* 137  --  138  K 4.4 4.1 4.2 4.4  --  4.4  < > 4.1  < > 4.3 5.4* 6.0* 6.6* 5.4* 3.4*  --   3.9  CL 95* 95* 96 95*  --  94*  < > 105  --  106 105 104  --  101 101  --  100  CO2 28 26 23 26   --  29  --   --   --   --   --   --   --  20  --   --  26  GLUCOSE 98 93 94 90  --  89  < > 172*  < > 112* 150* 232* 219* 177* 98  --  121*  BUN 23 33* 31* 32*  --  19  < > 20  --  23 25* 25*  --  27* 11  --  19  CREATININE 6.80* 9.56* 9.30* 10.00*  --  7.01*  < > 6.70*  --  6.99*  7.90* 6.90* 6.90*  --  6.87* 3.90* 3.70* 5.61*  CALCIUM 10.9* 11.3* 10.5 10.6*  --  10.6*  --   --   --   --   --   --   --  9.3  --   --  9.7  PHOS 5.0* 7.3* 6.3*  --  6.9*  --   --   --   --   --   --   --   --   --   --   --   --   < > = values in this interval not displayed. CBC  Recent Labs Lab  04/05/14 2239  04/06/14 0340 04/06/14 1620 04/06/14 1700 04/07/14 0415  WBC 25.2*  --  24.1*  --  16.0* 14.5*  HGB 7.7*  < > 9.9* 9.5* 9.2* 8.3*  HCT 23.2*  < > 29.5* 28.0* 25.9* 23.3*  MCV 91.3  --  90.5  --  84.1 84.1  PLT 165  --  96*  --  87* 71*  < > = values in this interval not displayed. Medications:    . acetaminophen  1,000 mg Oral 4 times per day  . antiseptic oral rinse  7 mL Mouth Rinse QID  . aspirin EC  325 mg Oral Daily  . bisacodyl  10 mg Oral Daily   Or  . bisacodyl  10 mg Rectal Daily  .  ceFAZolin (ANCEF) IV  2 g Intravenous Q T,Th,Sat-1800  . chlorhexidine  15 mL Mouth/Throat BID  . darbepoetin (ARANESP) injection - DIALYSIS  40 mcg Intravenous Q Thu-HD  . docusate sodium  200 mg Oral Daily  . insulin aspart  0-24 Units Subcutaneous 6 times per day  . insulin detemir  20 Units Subcutaneous BID  . insulin regular  0-10 Units Intravenous TID WC  . [START ON 04/08/2014] pantoprazole  40 mg Oral Daily  . sodium chloride  3 mL Intravenous Q12H   Elmarie Shiley, MD 04/07/2014, 8:04 AM

## 2014-04-07 NOTE — Progress Notes (Signed)
NUTRITION FOLLOW UP  Intervention:   Resource Breeze po TID, each supplement provides 250 kcal and 9 grams of protein RD to follow for nutrition care plan  Nutrition Dx:   Increased nutrient needs related to chronic illness, ESRD as evidenced by estimated nutrition needs, ongoing  Goal:  Pt to meet >/= 90% of their estimated nutrition needs, progressing  Monitor:   PO & supplemental intake, weight, labs, I/O's  Assessment:   48 year old male with PMH of HTN, ESRD on HD p/w worsening cough, SOB 1 day. Pt states that approx 3 weeks ago he developed a cough productive of yellowish sputum assoc with SOB. showed a small vegetation on the posterior leaflet possible moderate to severe MR (not well-visualized), leading to concern for endocarditis with valve perforation.  Patient s/p procedure 9/21: LEFT AND RIGHT HEART CATHETERIZATION WITH CORONARY ANGIOGRAM  Patient s/p procedure 9/30: MINIMALLY INVASIVE MITRAL VALVE REPLACEMENT  RE-EXPLORATION FOR BLEEDING  Patient extubated post-op this AM.  Patient previously on a Renal w/1200 ml fluid restriction diet.  PO intake was good at 95-100% per flowsheet records.  Currently on Clear Liquids.  Pt had Resource Breeze and Nepro Shake oral nutrition supplements ordered prior to surgery.  Increased nutrient needs ongoing.  Will re-order Resource Breeze at this time.  Height: Ht Readings from Last 1 Encounters:  04/05/14 5\' 11"  (1.803 m)    Weight Status:   Wt Readings from Last 1 Encounters:  04/06/14 245 lb 2.4 oz (111.2 kg)    Re-estimated needs:  Kcal: 2150-2350  Protein: 105-115 grams  Fluid: 1.2 L/day  Skin: +1 RLE edema, +2 RLE edema; closed incision on left leg  Diet Order: Clear Liquid   Intake/Output Summary (Last 24 hours) at 04/07/14 1218 Last data filed at 04/07/14 1202  Gross per 24 hour  Intake 2071.2 ml  Output     12 ml  Net 2059.2 ml   Labs:   Recent Labs Lab 04/02/14 0445 04/02/14 0716  04/04/14 1105  04/05/14 0419  04/05/14 2130  04/06/14 0600 04/06/14 1620 04/06/14 1700 04/07/14 0415  NA 139 139  < >  --  137  < > 133*  < > 136* 137  --  138  K 4.1 4.2  < >  --  4.4  < > 4.3  < > 5.4* 3.4*  --  3.9  CL 95* 96  < >  --  94*  < > 106  < > 101 101  --  100  CO2 26 23  < >  --  29  --   --   --  20  --   --  26  BUN 33* 31*  < >  --  19  < > 23  < > 27* 11  --  19  CREATININE 9.56* 9.30*  < >  --  7.01*  < > 6.99*  7.90*  < > 6.87* 3.90* 3.70* 5.61*  CALCIUM 11.3* 10.5  < >  --  10.6*  --   --   --  9.3  --   --  9.7  MG  --   --   --   --   --   --  2.0  --   --   --  1.7  --   PHOS 7.3* 6.3*  --  6.9*  --   --   --   --   --   --   --   --  GLUCOSE 93 94  < >  --  89  < > 112*  < > 177* 98  --  121*  < > = values in this interval not displayed.  CBG (last 3)   Recent Labs  04/07/14 0111 04/07/14 0208 04/07/14 0313  GLUCAP 126* 134* 122*    Scheduled Meds: . acetaminophen  1,000 mg Oral 4 times per day  . antiseptic oral rinse  7 mL Mouth Rinse QID  . aspirin EC  325 mg Oral Daily  . bisacodyl  10 mg Oral Daily   Or  . bisacodyl  10 mg Rectal Daily  .  ceFAZolin (ANCEF) IV  2 g Intravenous Q T,Th,Sat-1800  . chlorhexidine  15 mL Mouth/Throat BID  . darbepoetin (ARANESP) injection - DIALYSIS  40 mcg Intravenous Q Thu-HD  . docusate sodium  200 mg Oral Daily  . insulin aspart  0-24 Units Subcutaneous 6 times per day  . insulin detemir  20 Units Subcutaneous BID  . insulin regular  0-10 Units Intravenous TID WC  . [START ON 04/08/2014] pantoprazole  40 mg Oral Daily  . sodium chloride  3 mL Intravenous Q12H    Continuous Infusions: . sodium chloride 10 mL/hr at 04/07/14 0944  . sodium chloride    . sodium chloride    . amiodarone 30 mg/hr (04/07/14 1145)  . insulin (NOVOLIN-R) infusion Stopped (04/07/14 1104)  . milrinone 0.1 mcg/kg/min (04/07/14 0755)  . norepinephrine (LEVOPHED) Adult infusion 4.053 mcg/min (04/07/14 0700)  . vasopressin (PITRESSIN) infusion -  *FOR SHOCK* Stopped (04/07/14 1202)    Arthur Holms, RD, LDN Pager #: [phone] After-Hours Pager #: [phone]

## 2014-04-07 NOTE — Progress Notes (Signed)
      [address]       [place] [address]             [phone]        CARDIOTHORACIC SURGERY PROGRESS NOTE  R2 Days Post-Op Procedure(s) (LRB):  MINIMALLY INVASIVE MITRAL VALVE REPAIR (MVR) (Right)  INTRAOPERATIVE TRANSESOPHAGEAL ECHOCARDIOGRAM (N/A)    R2 Days Post-Op Procedure(s) (LRB): Bring back MINIMALLY INVASIVE MITRAL VALVE (MV) REPLACEMENT - Reexploration for bleeding (Right)  Subjective: Alert on vent.  Wants ET tube out.  Looks remarkably good.  Objective: Vital signs: BP Readings from Last 1 Encounters:  04/07/14 106/57   Pulse Readings from Last 1 Encounters:  04/07/14 80   Resp Readings from Last 1 Encounters:  04/07/14 27   Temp Readings from Last 1 Encounters:  04/07/14 99.1 F (37.3 C)     Hemodynamics: PAP: (30-45)/(14-30) 35/19 mmHg CO:  [5.5 L/min-8.5 L/min] 7.8 L/min CI:  [2.6 L/min/m2-3.9 L/min/m2] 3.6 L/min/m2  Physical Exam:  Rhythm:   Sinus - AAI paced  Breath sounds: clear  Heart sounds:  RRR w/out murmur  Incisions:  Dressings dry, intact  Abdomen:  Soft, non-distended, non-tender  Extremities:  Warm, well-perfused    Intake/Output from previous day: 09/30 0701 - 10/01 0700 In: 2355.9 [I.V.:2185.9; NG/GT:120; IV Piggyback:50] Out: 441 [Urine:95; Emesis/NG output:350; Chest Tube:400] Intake/Output this shift:    Lab Results:  CBC: Recent Labs  04/06/14 1700 04/07/14 0415  WBC 16.0* 14.5*  HGB 9.2* 8.3*  HCT 25.9* 23.3*  PLT 87* 71*    BMET:  Recent Labs  04/06/14 0600 04/06/14 1620 04/06/14 1700 04/07/14 0415  NA 136* 137  --  138  K 5.4* 3.4*  --  3.9  CL 101 101  --  100  CO2 20  --   --  26  GLUCOSE 177* 98  --  121*  BUN 27* 11  --  19  CREATININE 6.87* 3.90* 3.70* 5.61*  CALCIUM 9.3  --   --  9.7     CBG (last 3)   Recent Labs  04/07/14 0111 04/07/14 0208 04/07/14 0313  GLUCAP 126* 134* 122*    ABG    Component Value Date/Time   PHART 7.472* 04/07/2014 0656   PCO2ART  39.6 04/07/2014 0656   PO2ART 141.0* 04/07/2014 0656   HCO3 28.9* 04/07/2014 0656   TCO2 30 04/07/2014 0656   ACIDBASEDEF 1.0 04/06/2014 0524   O2SAT 99.0 04/07/2014 0656    CXR: Looks remarkably good.  Mild bibasilar atelectasis R>L and mild congestion  Assessment/Plan:  Looks remarkably good. Maintaining NSR w/ stable hemodynamics on low dose milrinone, levophed and vasopressin, excellent cardiac output and PA pressures relatively low Levophed dose weaned considerably overnight Excellent oxygenation with clear CXR, looks ready for extubation ESRD  Expected post op volume excess w/ weight up 10 kg Expected post op acute blood loss anemia, slightly worse Expected post op atelectasis, mild Post op thrombocytopenia, mild, slightly worse overnight Atrial fibrillation in OR, maintaining NSR ever since on IV amiodarone Hyperkalemia resolved   Extubate  Wean drips  HD per nephrology team - perhaps try to take some volume off today  Keep femoral A-line for now as radial A-line unreliable for BP measurement - perhaps d/c later today if able to wean drips  Mobilize once able to take femoral A-line out   Timothy Palmer 04/07/2014 7:47 AM

## 2014-04-07 NOTE — Progress Notes (Signed)
CT surgery p.m. Rounds  Patient resting comfortably-tolerating hemodialysis today without difficulty Femoral A-line has been pulled and patient currently at bedrest P.m. labs reviewed and are satisfactory-hemoglobin 8.8, potassium 4.2

## 2014-04-07 NOTE — Procedures (Signed)
Extubation Procedure Note  Patient Details:   Name: Timothy Palmer DOB: [birth year] MRN: [id number]   Airway Documentation:  Airway (Active)  Secured at (cm) 23 cm 04/07/2014  3:45 AM  Measured From Lips 04/07/2014  3:45 AM  [place] 04/07/2014  3:45 AM  Secured By [employer] 04/07/2014  3:45 AM  Tube Holder Repositioned Yes 04/07/2014  3:45 AM  Cuff Pressure (cm H2O) 30 cm H2O 04/06/2014  8:34 PM  Site Condition Dry 04/07/2014  3:45 AM    Evaluation  O2 sats: stable throughout Complications: No apparent complications Patient did tolerate procedure well. Bilateral Breath Sounds: Rhonchi Suctioning: Airway Yes  Cuff leak present  Hope Pigeon, MA 04/07/2014, 7:53 AM

## 2014-04-08 ENCOUNTER — Inpatient Hospital Stay (HOSPITAL_COMMUNITY): Payer: Medicare Other

## 2014-04-08 LAB — BASIC METABOLIC PANEL
ANION GAP: 14 (ref 5–15)
BUN: 20 mg/dL (ref 6–23)
CALCIUM: 10.2 mg/dL (ref 8.4–10.5)
CO2: 26 meq/L (ref 19–32)
Chloride: 99 mEq/L (ref 96–112)
Creatinine, Ser: 5.32 mg/dL — ABNORMAL HIGH (ref 0.50–1.35)
GFR calc non Af Amer: 12 mL/min — ABNORMAL LOW (ref >90)
GFR, EST AFRICAN AMERICAN: 14 mL/min — AB (ref >90)
Glucose, Bld: 74 mg/dL (ref 70–99)
Potassium: 4.5 mEq/L (ref 3.7–5.3)
Sodium: 139 mEq/L (ref 137–147)

## 2014-04-08 LAB — TYPE AND SCREEN
ABO/RH(D): B POS
Antibody Screen: NEGATIVE
UNIT DIVISION: 0
UNIT DIVISION: 0
UNIT DIVISION: 0
UNIT DIVISION: 0
Unit division: 0
Unit division: 0
Unit division: 0
Unit division: 0
Unit division: 0
Unit division: 0
Unit division: 0
Unit division: 0

## 2014-04-08 LAB — CBC
HCT: 25.1 % — ABNORMAL LOW (ref 39.0–52.0)
Hemoglobin: 8.5 g/dL — ABNORMAL LOW (ref 13.0–17.0)
MCH: 29.5 pg (ref 26.0–34.0)
MCHC: 33.9 g/dL (ref 30.0–36.0)
MCV: 87.2 fL (ref 78.0–100.0)
Platelets: 79 10*3/uL — ABNORMAL LOW (ref 150–400)
RBC: 2.88 MIL/uL — ABNORMAL LOW (ref 4.22–5.81)
RDW: 16.1 % — ABNORMAL HIGH (ref 11.5–15.5)
WBC: 17.1 10*3/uL — AB (ref 4.0–10.5)

## 2014-04-08 LAB — GLUCOSE, CAPILLARY
GLUCOSE-CAPILLARY: 97 mg/dL (ref 70–99)
Glucose-Capillary: 83 mg/dL (ref 70–99)
Glucose-Capillary: 82 mg/dL (ref 70–99)
Glucose-Capillary: 80 mg/dL (ref 70–99)
Glucose-Capillary: 73 mg/dL (ref 70–99)

## 2014-04-08 LAB — TISSUE CULTURE
CULTURE: NO GROWTH
Gram Stain: NONE SEEN

## 2014-04-08 MED ORDER — WARFARIN - PHYSICIAN DOSING INPATIENT
Freq: Every day | Status: DC
  Administered 2014-04-08 – 2014-04-13 (×4)

## 2014-04-08 MED ORDER — AMIODARONE HCL 200 MG PO TABS: 200.0000 mg | ORAL_TABLET | Freq: Two times a day (BID) | ORAL | Status: DC

## 2014-04-08 MED ORDER — DARBEPOETIN ALFA-POLYSORBATE 100 MCG/0.5ML IJ SOLN
100.0000 ug | INTRAMUSCULAR | Status: DC
  Administered 2014-04-09: 100 ug via INTRAVENOUS

## 2014-04-08 MED ORDER — SEVELAMER CARBONATE 800 MG PO TABS
1600.0000 mg | ORAL_TABLET | Freq: Three times a day (TID) | ORAL | Status: DC
  Administered 2014-04-08 – 2014-04-15 (×16): 1600 mg via ORAL
  Filled 2014-04-08 (×25): qty 2

## 2014-04-08 MED ORDER — MOVING RIGHT ALONG BOOK
Freq: Once | Status: AC
  Administered 2014-04-08: 08:00:00
  Filled 2014-04-08: qty 1

## 2014-04-08 MED ORDER — SODIUM CHLORIDE 0.9 % IJ SOLN
3.0000 mL | Freq: Two times a day (BID) | INTRAMUSCULAR | Status: DC
  Administered 2014-04-08 – 2014-04-09 (×3): 3 mL via INTRAVENOUS

## 2014-04-08 MED ORDER — CINACALCET HCL 30 MG PO TABS
60.0000 mg | ORAL_TABLET | Freq: Every day | ORAL | Status: DC
  Administered 2014-04-08: 60 mg via ORAL
  Filled 2014-04-08 (×2): qty 2

## 2014-04-08 MED ORDER — WARFARIN SODIUM 2.5 MG PO TABS
2.5000 mg | ORAL_TABLET | Freq: Every day | ORAL | Status: DC
  Administered 2014-04-08 – 2014-04-11 (×4): 2.5 mg via ORAL
  Filled 2014-04-08 (×5): qty 1

## 2014-04-08 MED ORDER — INSULIN ASPART 100 UNIT/ML ~~LOC~~ SOLN: 0.0000 [IU] | Freq: Three times a day (TID) | SUBCUTANEOUS | Status: DC

## 2014-04-08 MED ORDER — ASPIRIN EC 81 MG PO TBEC
81.0000 mg | DELAYED_RELEASE_TABLET | Freq: Every day | ORAL | Status: DC
  Administered 2014-04-08 – 2014-04-10 (×3): 81 mg via ORAL
  Filled 2014-04-08 (×3): qty 1

## 2014-04-08 MED ORDER — SEVELAMER CARBONATE 800 MG PO TABS
2400.0000 mg | ORAL_TABLET | Freq: Three times a day (TID) | ORAL | Status: DC
  Filled 2014-04-08 (×3): qty 3

## 2014-04-08 MED ORDER — CINACALCET HCL 30 MG PO TABS
60.0000 mg | ORAL_TABLET | Freq: Every day | ORAL | Status: DC
  Administered 2014-04-08 – 2014-04-15 (×8): 60 mg via ORAL
  Filled 2014-04-08 (×8): qty 2

## 2014-04-08 MED ORDER — INSULIN DETEMIR 100 UNIT/ML ~~LOC~~ SOLN: 20.0000 [IU] | Freq: Every day | SUBCUTANEOUS | Status: DC

## 2014-04-08 MED ORDER — SODIUM CHLORIDE 0.9 % IJ SOLN: 3.0000 mL | INTRAMUSCULAR | Status: DC | PRN

## 2014-04-08 MED ORDER — SODIUM CHLORIDE 0.9 % IV SOLN: 250.0000 mL | INTRAVENOUS | Status: DC | PRN

## 2014-04-08 MED ORDER — TRAMADOL HCL 50 MG PO TABS: 50.0000 mg | ORAL_TABLET | Freq: Two times a day (BID) | ORAL | Status: DC | PRN

## 2014-04-08 MED ORDER — AMIODARONE HCL 200 MG PO TABS
200.0000 mg | ORAL_TABLET | Freq: Every day | ORAL | Status: DC
  Administered 2014-04-08 – 2014-04-10 (×3): 200 mg via ORAL
  Filled 2014-04-08 (×4): qty 1

## 2014-04-08 NOTE — Progress Notes (Signed)
Patient ID: Timothy Palmer, male   DOB: 11/09/1965, 48 y.o.   MRN: [id number] EVENING ROUNDS NOTE :     [address]       [place] [address]             [phone]                 3 Days Post-Op Procedure(s) (LRB): Bring back MINIMALLY INVASIVE MITRAL VALVE (MV) REPLACEMENT - Reexploration for bleeding (Right)  Total Length of Stay:  LOS: 17 days  BP 141/68  Pulse 60  Temp(Src) 98 F (36.7 C) (Oral)  Resp 0  Ht 5\' 11"  (1.803 m)  Wt 239 lb 13.8 oz (108.8 kg)  BMI 33.47 kg/m2  SpO2 99%  .Intake/Output     10/01 0701 - 10/02 0700 10/02 0701 - 10/03 0700   P.O. 150    I.V. (mL/kg) 847.9 (7.8) 173.5 (1.6)   NG/GT     IV Piggyback 50    Total Intake(mL/kg) 1047.9 (9.6) 173.5 (1.6)   Urine (mL/kg/hr) 50 (0)    Emesis/NG output     Other 2000 (0.8)    Stool 1 (0)    Chest Tube 440 (0.2) 110 (0.1)   Total Output 2491 110   Net -1443.1 +63.5          . sodium chloride 10 mL/hr at 04/08/14 0600  . sodium chloride    . sodium chloride       Lab Results  Component Value Date   WBC 17.1* 04/08/2014   HGB 8.5* 04/08/2014   HCT 25.1* 04/08/2014   PLT 79* 04/08/2014   GLUCOSE 74 04/08/2014   ALT <5 04/07/2014   AST 38* 04/07/2014   NA 139 04/08/2014   K 4.5 04/08/2014   CL 99 04/08/2014   CREATININE 5.32* 04/08/2014   BUN 20 04/08/2014   CO2 26 04/08/2014   TSH 0.901 03/22/2014   INR 1.32 04/06/2014   HGBA1C 6.0* 04/05/2014   stable day, dialysis tomorrow   Grace Isaac MD  Beeper [id number] Office [phone] 04/08/2014 6:37 PM

## 2014-04-08 NOTE — Progress Notes (Signed)
Patient ID: Timothy Palmer, male   DOB: 25-Nov-1965, 48 y.o.   MRN: Timothy Palmer  [name] Progress Note   Assessment/ Plan:   1 MV endocarditis / MSSA bacteremia- on IV Ancef- for a total of 6 weeks, s/p minimally invasive MV repair (valvuloplasty with pericardial patch repair of posterior leaflet) with RA retrograde cannulation site repair following bleed post-extubation. Weaned off pressors yesterday. 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 3 ESRD on HD - will schedule for hemodialysis tomorrow for attempts at ultrafiltration here in the SICU (patient refuses dialysis today). Heparin free. 4 HTN blood pressures being supported with pressors while on milrinone and amiodarone 5 Anemia hgb 8.5-I will increase Aranesp dose-s/p PRBCs and FFP for ABLA post-op 6 HPTH resume his Sensipar and Renvela now that he is able to take in orally  Subjective:   Reports to be feeling well this morning and denies any complaints other than expected postoperative pain. No acute events overnight    Objective:   BP 131/64  Pulse 59  Temp(Src) 97.6 F (36.4 C) (Oral)  Resp 32  Ht 5\' 11"  (1.803 m)  Wt 108.8 kg (239 lb 13.8 oz)  BMI 33.47 kg/m2  SpO2 97%  Physical Exam: Gen: Appears to be comfortable resting in his recliner CVS: Pulse regular in rate and rhythm, normal S1 and S2 Resp: Poor inspiratory effort and with decreased breath sounds bilaterally however no rales/rhonchi Abd: Soft, flat, nontender Ext: 1+ left lower extremity edema, trace-right lower extremity edema. Surgical wounds left leg. Left radiocephalic fistula with well-formed buttonhole cannulation zones   Labs: BMET  Recent Labs Lab 04/02/14 0445 04/02/14 0716 04/04/14 0415 04/04/14 1105 04/05/14 0419  04/06/14 0105 04/06/14 0220 04/06/14 0328 04/06/14 0600 04/06/14 1620 04/06/14 1700 04/07/14 0415 04/07/14 1700 04/07/14 1755  04/08/14 0430  NA 139 139 138  --  137  < > 134* 132* 132* 136* 137  --  138  --  137 139  K 4.1 4.2 4.4  --  4.4  < > 5.4* 6.0* 6.6* 5.4* 3.4*  --  3.9  --  4.2 4.5  CL 95* 96 95*  --  94*  < > 105 104  --  101 101  --  100  --  101 99  CO2 26 23 26   --  29  --   --   --   --  20  --   --  26  --   --  26  GLUCOSE 93 94 90  --  89  < > 150* 232* 219* 177* 98  --  121*  --  112* 74  BUN 33* 31* 32*  --  19  < > 25* 25*  --  27* 11  --  19  --  12 20  CREATININE 9.56* 9.30* 10.00*  --  7.01*  < > 6.90* 6.90*  --  6.87* 3.90* 3.70* 5.61* 3.78* 3.60* 5.32*  CALCIUM 11.3* 10.5 10.6*  --  10.6*  --   --   --   --  9.3  --   --  9.7  --   --  10.2  PHOS 7.3* 6.3*  --  6.9*  --   --   --   --   --   --   --   --   --   --   --   --   < > =  values in this interval not displayed. CBC  Recent Labs Lab 04/06/14 1700 04/07/14 0415 04/07/14 1700 04/07/14 1755 04/08/14 0430  WBC 16.0* 14.5* 17.4*  --  17.1*  HGB 9.2* 8.3* 8.9* 8.8* 8.5*  HCT 25.9* 23.3* 25.4* 26.0* 25.1*  MCV 84.1 84.1 86.1  --  87.2  PLT 87* 71* 70*  --  79*   Medications:    . acetaminophen  1,000 mg Oral 4 times per day  . amiodarone  200 mg Oral Daily  . aspirin EC  81 mg Oral Daily  . bisacodyl  10 mg Oral Daily   Or  . bisacodyl  10 mg Rectal Daily  .  ceFAZolin (ANCEF) IV  2 g Intravenous Q T,Th,Sat-1800  . chlorhexidine  15 mL Mouth/Throat BID  . darbepoetin (ARANESP) injection - DIALYSIS  40 mcg Intravenous Q Thu-HD  . docusate sodium  200 mg Oral Daily  . feeding supplement (RESOURCE BREEZE)  1 Container Oral TID BM  . insulin aspart  0-24 Units Subcutaneous TID AC & HS  . moving right along book   Does not apply Once  . pantoprazole  40 mg Oral Daily  . sodium chloride  3 mL Intravenous Q12H  . sodium chloride  3 mL Intravenous Q12H  . warfarin  2.5 mg Oral q1800  . Warfarin - Physician Dosing Inpatient   Does not apply [id number]   Timothy Shiley, MD 04/08/2014, 8:42 AM

## 2014-04-08 NOTE — Progress Notes (Signed)
Utilization Review Completed.  

## 2014-04-08 NOTE — Progress Notes (Addendum)
[address]       [place] [address]             [phone]        CARDIOTHORACIC SURGERY PROGRESS NOTE  R3 Days Post-Op Procedure(s) (LRB):  MINIMALLY INVASIVE MITRAL VALVE REPAIR (MVR) (Right)  INTRAOPERATIVE TRANSESOPHAGEAL ECHOCARDIOGRAM (N/A)    R3 Days Post-Op Procedure(s) (LRB): Bring back MINIMALLY INVASIVE MITRAL VALVE (MV) REPLACEMENT - Reexploration for bleeding (Right)  Subjective: Looks good.  Mild soreness in chest.  Denies SOB.  Objective: Vital signs: BP Readings from Last 1 Encounters:  04/08/14 131/64   Pulse Readings from Last 1 Encounters:  04/08/14 59   Resp Readings from Last 1 Encounters:  04/08/14 32   Temp Readings from Last 1 Encounters:  04/08/14 97.6 F (36.4 C) Oral    Hemodynamics: PAP: (38-69)/(1-30) 69/7 mmHg CO:  [6.9 L/min-8.3 L/min] 8.3 L/min CI:  [3.2 L/min/m2-3.8 L/min/m2] 3.8 L/min/m2  Physical Exam:  Rhythm:   Sinus 55-60/min  Breath sounds: clear  Heart sounds:  RRR w/out murmur  Incisions:  Dressings dry, intact  Abdomen:  Soft, non-distended, non-tender  Extremities:  Warm, well-perfused    Intake/Output from previous day: 10/01 0701 - 10/02 0700 In: 1021.2 [P.O.:150; I.V.:821.2; IV Piggyback:50] Out: 2491 [Urine:50; Stool:1; Chest Tube:440] Intake/Output this shift:    Lab Results:  CBC: Recent Labs  04/07/14 1700 04/07/14 1755 04/08/14 0430  WBC 17.4*  --  17.1*  HGB 8.9* 8.8* 8.5*  HCT 25.4* 26.0* 25.1*  PLT 70*  --  79*    BMET:  Recent Labs  04/07/14 0415  04/07/14 1755 04/08/14 0430  NA 138  --  137 139  K 3.9  --  4.2 4.5  CL 100  --  101 99  CO2 26  --   --  26  GLUCOSE 121*  --  112* 74  BUN 19  --  12 20  CREATININE 5.61*  < > 3.60* 5.32*  CALCIUM 9.7  --   --  10.2  < > = values in this interval not displayed.   CBG (last 3)   Recent Labs  04/07/14 1942 04/07/14 2345 04/08/14 0348  GLUCAP 92 90 83    ABG    Component Value Date/Time   PHART  7.482* 04/07/2014 0857   PCO2ART 40.4 04/07/2014 0857   PO2ART 133.0* 04/07/2014 0857   HCO3 30.2* 04/07/2014 0857   TCO2 26 04/07/2014 1755   ACIDBASEDEF 1.0 04/06/2014 0524   O2SAT 99.0 04/07/2014 0857    CXR: Looks good.  Mild bibasilar atelectasis R>L  Assessment/Plan:  Doing very well POD3 Maintaining NSR w/ stable hemodynamics off all drips Expected post op volume excess w/ weight up 8-9 kg over preop dry weight but down 2kg last 24 hrs Expected post op acute blood loss anemia, stable Expected post op atelectasis, mild, R>L Post op thrombocytopenia, mild, stable Atrial fibrillation in OR, maintaining NSR ever since on IV amiodarone   Mobilize  Continue to hold lisinopril but resume if/when BP increases  HD per Nephrology team - would favor dialysis again today or tomorrow in SICU for volume removal  D/C A-line and sleeve  Leave chest tubes at least 1 more day - d/c when output decreased < 200 mL/day  Convert amiodarone to oral  Start low dose coumadin w/ plan to stop after 3 months if rhythm stable  Continue low dose ASA until therapeutic on coumadin  Keep in SICU today and  possibly transfer to step-down 1-2 days if he continues to improve and does well w/ HD   Warner Laduca H 04/08/2014 7:58 AM

## 2014-04-09 ENCOUNTER — Inpatient Hospital Stay (HOSPITAL_COMMUNITY): Payer: Medicare Other

## 2014-04-09 LAB — CBC
HCT: 25.4 % — ABNORMAL LOW (ref 39.0–52.0)
Hemoglobin: 8.6 g/dL — ABNORMAL LOW (ref 13.0–17.0)
MCH: 30 pg (ref 26.0–34.0)
MCHC: 33.9 g/dL (ref 30.0–36.0)
MCV: 88.5 fL (ref 78.0–100.0)
Platelets: 93 10*3/uL — ABNORMAL LOW (ref 150–400)
RBC: 2.87 MIL/uL — ABNORMAL LOW (ref 4.22–5.81)
RDW: 16.1 % — AB (ref 11.5–15.5)
WBC: 14.7 10*3/uL — ABNORMAL HIGH (ref 4.0–10.5)

## 2014-04-09 LAB — BASIC METABOLIC PANEL
Anion gap: 16 — ABNORMAL HIGH (ref 5–15)
BUN: 31 mg/dL — ABNORMAL HIGH (ref 6–23)
CHLORIDE: 98 meq/L (ref 96–112)
CO2: 26 mEq/L (ref 19–32)
CREATININE: 7.39 mg/dL — AB (ref 0.50–1.35)
Calcium: 9.6 mg/dL (ref 8.4–10.5)
GFR, EST AFRICAN AMERICAN: 9 mL/min — AB (ref >90)
GFR, EST NON AFRICAN AMERICAN: 8 mL/min — AB (ref >90)
Glucose, Bld: 82 mg/dL (ref 70–99)
Potassium: 4.7 mEq/L (ref 3.7–5.3)
Sodium: 140 mEq/L (ref 137–147)

## 2014-04-09 LAB — GLUCOSE, CAPILLARY
GLUCOSE-CAPILLARY: 85 mg/dL (ref 70–99)
GLUCOSE-CAPILLARY: 87 mg/dL (ref 70–99)
Glucose-Capillary: 116 mg/dL — ABNORMAL HIGH (ref 70–99)
Glucose-Capillary: 107 mg/dL — ABNORMAL HIGH (ref 70–99)

## 2014-04-09 LAB — PROTIME-INR
INR: 1.33 (ref 0.00–1.49)
Prothrombin Time: 16.5 seconds — ABNORMAL HIGH (ref 11.6–15.2)

## 2014-04-09 MED ORDER — DARBEPOETIN ALFA-POLYSORBATE 100 MCG/0.5ML IJ SOLN
INTRAMUSCULAR | Status: AC
  Filled 2014-04-09: qty 0.5

## 2014-04-09 MED ORDER — LISINOPRIL 10 MG PO TABS
10.0000 mg | ORAL_TABLET | Freq: Every day | ORAL | Status: DC
  Administered 2014-04-10: 10 mg via ORAL
  Filled 2014-04-09 (×2): qty 1

## 2014-04-09 MED ORDER — LABETALOL HCL 5 MG/ML IV SOLN
10.0000 mg | INTRAVENOUS | Status: DC | PRN
  Administered 2014-04-09: 10 mg via INTRAVENOUS
  Filled 2014-04-09 (×2): qty 4

## 2014-04-09 NOTE — Progress Notes (Signed)
Patient ID: Timothy Palmer, male   DOB: 1965-12-27, 48 y.o.   MRN: [id number] EVENING ROUNDS NOTE :     [address]       [place] [address]             [phone]                 4 Days Post-Op Procedure(s) (LRB): Bring back MINIMALLY INVASIVE MITRAL VALVE (MV) REPLACEMENT - Reexploration for bleeding (Right)  Total Length of Stay:  LOS: 18 days  BP 124/67  Pulse 58  Temp(Src) 97.9 F (36.6 C) (Oral)  Resp 22  Ht 5\' 11"  (1.803 m)  Wt 233 lb 0.4 oz (105.7 kg)  BMI 32.51 kg/m2  SpO2 97%  .Intake/Output     10/03 0701 - 10/04 0700   P.O. 240   I.V. (mL/kg)    IV Piggyback 50   Total Intake(mL/kg) 290 (2.7)   Other 4000   Chest Tube 110   Total Output 4110   Net -3820         . sodium chloride 10 mL/hr at 04/08/14 0600  . sodium chloride    . sodium chloride       Lab Results  Component Value Date   WBC 14.7* 04/09/2014   HGB 8.6* 04/09/2014   HCT 25.4* 04/09/2014   PLT 93* 04/09/2014   GLUCOSE 82 04/09/2014   ALT <5 04/07/2014   AST 38* 04/07/2014   NA 140 04/09/2014   K 4.7 04/09/2014   CL 98 04/09/2014   CREATININE 7.39* 04/09/2014   BUN 31* 04/09/2014   CO2 26 04/09/2014   TSH 0.901 03/22/2014   INR 1.33 04/09/2014   HGBA1C 6.0* 04/05/2014   Tolerated dialysis today 4 l off   Grace Isaac MD  Beeper [phone] Office [phone] 04/09/2014 7:34 PM

## 2014-04-09 NOTE — Progress Notes (Signed)
Patient ID: Timothy Palmer, male   DOB: 1966-07-02, 48 y.o.   MRN: [id number] TCTS DAILY ICU PROGRESS NOTE                   [address]            [place] [address]          [phone]   4 Days Post-Op Procedure(s) (LRB): Bring back MINIMALLY INVASIVE MITRAL VALVE (MV) REPLACEMENT - Reexploration for bleeding (Right)  Total Length of Stay:  LOS: 18 days   Subjective: Stable night   Objective: Vital signs in last 24 hours: Temp:  [97.6 F (36.4 C)-98.1 F (36.7 C)] 98.1 F (36.7 C) (10/03 0822) Pulse Rate:  [38-62] 59 (10/03 1030) Cardiac Rhythm:  [-] Normal sinus rhythm;Sinus bradycardia (10/03 0820) Resp:  [0-31] 28 (10/03 1030) BP: (103-166)/(60-80) 163/78 mmHg (10/03 1030) SpO2:  [91 %-100 %] 100 % (10/03 1030) Weight:  [240 lb 11.9 oz (109.2 kg)-241 lb 13.5 oz (109.7 kg)] 241 lb 13.5 oz (109.7 kg) (10/03 0822)  Filed Weights   04/08/14 0600 04/09/14 0500 04/09/14 0822  Weight: 239 lb 13.8 oz (108.8 kg) 240 lb 11.9 oz (109.2 kg) 241 lb 13.5 oz (109.7 kg)    Weight change: -10 lb 12.8 oz (-4.9 kg)   Hemodynamic parameters for last 24 hours:    Intake/Output from previous day: 10/02 0701 - 10/03 0700 In: 413.5 [P.O.:240; I.V.:173.5] Out: 360 [Chest Tube:360]  Intake/Output this shift: Total I/O In: -  Out: 40 [Chest Tube:40]  Current Meds: Scheduled Meds: . acetaminophen  1,000 mg Oral 4 times per day  . amiodarone  200 mg Oral Daily  . aspirin EC  81 mg Oral Daily  . bisacodyl  10 mg Oral Daily   Or  . bisacodyl  10 mg Rectal Daily  .  ceFAZolin (ANCEF) IV  2 g Intravenous Q T,Th,Sat-1800  . chlorhexidine  15 mL Mouth/Throat BID  . cinacalcet  60 mg Oral Daily  . darbepoetin      . darbepoetin (ARANESP) injection - DIALYSIS  100 mcg Intravenous Q Sat-HD  . docusate sodium  200 mg Oral Daily  . feeding supplement (RESOURCE BREEZE)  1 Container Oral TID BM  . insulin aspart  0-24 Units Subcutaneous TID AC & HS  . [START ON  04/10/2014] lisinopril  10 mg Oral Daily  . pantoprazole  40 mg Oral Daily  . sevelamer carbonate  1,600 mg Oral TID WC  . sodium chloride  3 mL Intravenous Q12H  . sodium chloride  3 mL Intravenous Q12H  . warfarin  2.5 mg Oral q1800  . Warfarin - Physician Dosing Inpatient   Does not apply q1800   Continuous Infusions: . sodium chloride 10 mL/hr at 04/08/14 0600  . sodium chloride    . sodium chloride     PRN Meds:.sodium chloride, labetalol, morphine injection, ondansetron (ZOFRAN) IV, oxyCODONE, sodium chloride, sodium chloride, traMADol  General appearance: alert and cooperative Neurologic: intact Heart: regular rate and rhythm, S1, S2 normal, no murmur, click, rub or gallop Lungs: diminished breath sounds bibasilar Abdomen: soft, non-tender; bowel sounds normal; no masses,  no organomegaly Extremities: extremities normal, atraumatic, no cyanosis or edema and Homans sign is negative, no sign of DVT Wound: intact  Lab Results: CBC: Recent Labs  04/08/14 0430 04/09/14 0326  WBC 17.1* 14.7*  HGB 8.5* 8.6*  HCT 25.1* 25.4*  PLT 79* 93*   BMET:  Recent Labs  04/08/14 0430 04/09/14 0326  NA 139 140  K 4.5 4.7  CL 99 98  CO2 26 26  GLUCOSE 74 82  BUN 20 31*  CREATININE 5.32* 7.39*  CALCIUM 10.2 9.6    PT/INR:  Recent Labs  04/09/14 0326  LABPROT 16.5*  INR 1.33   Radiology: Dg Chest Port 1 View  04/09/2014   CLINICAL DATA:  Followup of atelectasis. History of CHF. Hypertension. Pneumonia.  EXAM: PORTABLE CHEST - 1 VIEW  COMPARISON:  One day prior  FINDINGS: Right chest tubes are unchanged in position. External pacer. Mitral valve repair.  Cardiomegaly accentuated by AP portable technique. Moderate right hemidiaphragm elevation. No pleural effusion or pneumothorax. Patchy right-sided opacities, favored to represent atelectasis. These are not significantly changed. Improved left base atelectasis.  IMPRESSION: Similar right and improved left sided airspace disease,  favored to represent atelectasis. Cardiomegaly without congestive failure. Right chest tubes in place without pneumothorax.   Electronically Signed   By: Abigail Miyamoto M.D.   On: 04/09/2014 10:13   Dg Chest Port 1 View  04/08/2014   CLINICAL DATA:  Cardiac valve replacement.  EXAM: PORTABLE CHEST - 1 VIEW  COMPARISON:  04/07/2014.  FINDINGS: Interim extubation and removal NG tube. Interim removal Swan-Ganz catheter. Right chest tubes in stable position. Left IJ she noted in stable position. Prior cardiac valve replacement. Stable cardiomegaly with normal pulmonary vascularity. Stable bilateral multifocal subsegmental atelectasis. No pneumothorax.  IMPRESSION: 1. Interim removal of endotracheal tube and NG tube. Interval removal Swan-Ganz catheter. Lines and tubes otherwise in stable position. 2. Stable bilateral multifocal subsegmental atelectasis. 3. Stable cardiomegaly.  Cardiac valve replacement.   Electronically Signed   By: Marcello Moores  Register   On: 04/08/2014 08:12     Assessment/Plan: S/P Procedure(s) (LRB): Bring back MINIMALLY INVASIVE MITRAL VALVE (MV) REPLACEMENT - Reexploration for bleeding (Right) Doing very well POD 4 Maintaining NSR w/ stable hemodynamics off l drips  Expected post op acute blood loss anemia, stable   Post op thrombocytopenia, mild, improved up to [address] maintaining NSR ever since on IV amiodarone, convert amiodarone to oral  Leave chest tubes 360 over 24 hours d/c when output decreased  Continue low dose coumadin   Parul Porcelli B 04/09/2014 10:37 AM

## 2014-04-09 NOTE — Progress Notes (Signed)
Patient ID: Timothy Palmer, male   DOB: [birth year], 48 y.o.   MRN: [id number]  [name] Progress Note   Assessment/ Plan:   1 MV endocarditis / MSSA bacteremia- on IV Ancef- for a total of 6 weeks, s/p minimally invasive MV repair (valvuloplasty with pericardial patch repair of posterior leaflet) with RA retrograde cannulation site repair following bleed post-extubation. Continues to do better and anticipated transfer out of the SICU later today after dialysis.Marland Kitchen 2 Septic embolus + mycotic aneurysm LLE- s/p fem-pop BPG 9/18 -clinical with improved perfusion to the distal extremity. 3 ESRD on HD - awaiting hemodialysis today (nurse by bedside but apparently maintenance fixing leaky faucet first). No acute volume or electrolyte concerns noted. We'll continue Heparin free dialysis at this time given recent ABLA. 4 HTN blood pressures being supported with pressors while on milrinone and amiodarone 5 Anemia hgb 8.6 today-Aranesp dose increased-s/p PRBCs and FFP for ABLA post-op 6 HPTH:, Continue to monitor her on Sensipar and Renvela  Subjective:   Reports to have had a good night and denies any active complaints other than expected postoperative pain. No acute events noted.    Objective:   BP 140/66  Pulse 59  Temp(Src) 97.8 F (36.6 C) (Oral)  Resp 12  Ht 5\' 11"  (1.803 m)  Wt 109.2 kg (240 lb 11.9 oz)  BMI 33.59 kg/m2  SpO2 96%  Physical Exam: Gen: Appears to be comfortable resting in bed, awaiting dialysis CVS: Pulse regular in rate and rhythm, normal S1 and S2 Resp: Poor inspiratory effort and with decreased breath sounds bilaterally however no rales/rhonchi Abd: Soft, flat, nontender Ext: 1+ left lower extremity edema, trace-right lower extremity edema. Surgical wounds left leg. Left radiocephalic fistula with well-formed buttonhole cannulation zones   Labs: BMET  Recent Labs Lab 04/04/14 0415 04/04/14 1105 04/05/14 0419  04/06/14 0220 04/06/14 0328  04/06/14 0600 04/06/14 1620 04/06/14 1700 04/07/14 0415 04/07/14 1700 04/07/14 1755 04/08/14 0430 04/09/14 0326  NA 138  --  137  < > 132* 132* 136* 137  --  138  --  137 139 140  K 4.4  --  4.4  < > 6.0* 6.6* 5.4* 3.4*  --  3.9  --  4.2 4.5 4.7  CL 95*  --  94*  < > 104  --  101 101  --  100  --  101 99 98  CO2 26  --  29  --   --   --  20  --   --  26  --   --  26 26  GLUCOSE 90  --  89  < > 232* 219* 177* 98  --  121*  --  112* 74 82  BUN 32*  --  19  < > 25*  --  27* 11  --  19  --  12 20 31*  CREATININE 10.00*  --  7.01*  < > 6.90*  --  6.87* 3.90* 3.70* 5.61* 3.78* 3.60* 5.32* 7.39*  CALCIUM 10.6*  --  10.6*  --   --   --  9.3  --   --  9.7  --   --  10.2 9.6  PHOS  --  6.9*  --   --   --   --   --   --   --   --   --   --   --   --   < > = values in this interval not displayed. CBC  Recent Labs Lab 04/07/14 0415 04/07/14 1700 04/07/14 1755 04/08/14 0430 04/09/14 0326  WBC 14.5* 17.4*  --  17.1* 14.7*  HGB 8.3* 8.9* 8.8* 8.5* 8.6*  HCT 23.3* 25.4* 26.0* 25.1* 25.4*  MCV 84.1 86.1  --  87.2 88.5  PLT 71* 70*  --  79* 93*   Medications:    . acetaminophen  1,000 mg Oral 4 times per day  . amiodarone  200 mg Oral Daily  . aspirin EC  81 mg Oral Daily  . bisacodyl  10 mg Oral Daily   Or  . bisacodyl  10 mg Rectal Daily  .  ceFAZolin (ANCEF) IV  2 g Intravenous Q T,Th,Sat-1800  . chlorhexidine  15 mL Mouth/Throat BID  . cinacalcet  60 mg Oral Daily  . darbepoetin      . darbepoetin (ARANESP) injection - DIALYSIS  100 mcg Intravenous Q Sat-HD  . docusate sodium  200 mg Oral Daily  . feeding supplement (RESOURCE BREEZE)  1 Container Oral TID BM  . insulin aspart  0-24 Units Subcutaneous TID AC & HS  . pantoprazole  40 mg Oral Daily  . sevelamer carbonate  1,600 mg Oral TID WC  . sodium chloride  3 mL Intravenous Q12H  . sodium chloride  3 mL Intravenous Q12H  . warfarin  2.5 mg Oral q1800  . Warfarin - Physician Dosing Inpatient   Does not apply [id number]   Elmarie Shiley, MD 04/09/2014, 8:18 AM

## 2014-04-10 ENCOUNTER — Inpatient Hospital Stay (HOSPITAL_COMMUNITY): Payer: Medicare Other

## 2014-04-10 LAB — ANAEROBIC CULTURE: Gram Stain: NONE SEEN

## 2014-04-10 LAB — BASIC METABOLIC PANEL
Anion gap: 15 (ref 5–15)
BUN: 29 mg/dL — ABNORMAL HIGH (ref 6–23)
CO2: 27 mEq/L (ref 19–32)
Calcium: 10.2 mg/dL (ref 8.4–10.5)
Chloride: 94 mEq/L — ABNORMAL LOW (ref 96–112)
Creatinine, Ser: 6.2 mg/dL — ABNORMAL HIGH (ref 0.50–1.35)
GFR calc Af Amer: 11 mL/min — ABNORMAL LOW (ref >90)
GFR calc non Af Amer: 10 mL/min — ABNORMAL LOW (ref >90)
Glucose, Bld: 100 mg/dL — ABNORMAL HIGH (ref 70–99)
Potassium: 4.1 mEq/L (ref 3.7–5.3)
Sodium: 136 mEq/L — ABNORMAL LOW (ref 137–147)

## 2014-04-10 LAB — GLUCOSE, CAPILLARY
GLUCOSE-CAPILLARY: 100 mg/dL — AB (ref 70–99)
Glucose-Capillary: 103 mg/dL — ABNORMAL HIGH (ref 70–99)
Glucose-Capillary: 121 mg/dL — ABNORMAL HIGH (ref 70–99)
Glucose-Capillary: 99 mg/dL (ref 70–99)
Glucose-Capillary: 123 mg/dL — ABNORMAL HIGH (ref 70–99)

## 2014-04-10 LAB — CBC
HCT: 25.9 % — ABNORMAL LOW (ref 39.0–52.0)
Hemoglobin: 8.8 g/dL — ABNORMAL LOW (ref 13.0–17.0)
MCH: 30.8 pg (ref 26.0–34.0)
MCHC: 34 g/dL (ref 30.0–36.0)
MCV: 90.6 fL (ref 78.0–100.0)
Platelets: 144 10*3/uL — ABNORMAL LOW (ref 150–400)
RBC: 2.86 MIL/uL — ABNORMAL LOW (ref 4.22–5.81)
RDW: 15.6 % — ABNORMAL HIGH (ref 11.5–15.5)
WBC: 11.6 10*3/uL — ABNORMAL HIGH (ref 4.0–10.5)

## 2014-04-10 LAB — PROTIME-INR
INR: 1.56 — ABNORMAL HIGH (ref 0.00–1.49)
Prothrombin Time: 18.7 seconds — ABNORMAL HIGH (ref 11.6–15.2)

## 2014-04-10 MED ORDER — DOCUSATE SODIUM 100 MG PO CAPS
200.0000 mg | ORAL_CAPSULE | Freq: Every day | ORAL | Status: DC
  Filled 2014-04-10 (×5): qty 2

## 2014-04-10 MED ORDER — OXYCODONE HCL 5 MG PO TABS
5.0000 mg | ORAL_TABLET | ORAL | Status: DC | PRN
Start: 2014-04-10 — End: 2014-04-15
  Administered 2014-04-11: 5 mg via ORAL

## 2014-04-10 MED ORDER — SODIUM CHLORIDE 0.9 % IV SOLN: 250.0000 mL | INTRAVENOUS | Status: DC | PRN

## 2014-04-10 MED ORDER — TRAMADOL HCL 50 MG PO TABS: 50.0000 mg | ORAL_TABLET | ORAL | Status: DC | PRN

## 2014-04-10 MED ORDER — INSULIN ASPART 100 UNIT/ML ~~LOC~~ SOLN
0.0000 [IU] | Freq: Three times a day (TID) | SUBCUTANEOUS | Status: DC
  Administered 2014-04-10 – 2014-04-13 (×6): 2 [IU] via SUBCUTANEOUS
  Administered 2014-04-14: 4 [IU] via SUBCUTANEOUS

## 2014-04-10 MED ORDER — ASPIRIN EC 81 MG PO TBEC
81.0000 mg | DELAYED_RELEASE_TABLET | Freq: Every day | ORAL | Status: DC
  Administered 2014-04-11 – 2014-04-15 (×5): 81 mg via ORAL
  Filled 2014-04-10 (×6): qty 1

## 2014-04-10 MED ORDER — BISACODYL 10 MG RE SUPP: 10.0000 mg | Freq: Every day | RECTAL | Status: DC | PRN

## 2014-04-10 MED ORDER — SODIUM CHLORIDE 0.9 % IJ SOLN
3.0000 mL | Freq: Two times a day (BID) | INTRAMUSCULAR | Status: DC
  Administered 2014-04-10 – 2014-04-12 (×3): 3 mL via INTRAVENOUS

## 2014-04-10 MED ORDER — MOVING RIGHT ALONG BOOK
Freq: Once | Status: AC
  Filled 2014-04-10: qty 1

## 2014-04-10 MED ORDER — PANTOPRAZOLE SODIUM 40 MG PO TBEC
40.0000 mg | DELAYED_RELEASE_TABLET | Freq: Every day | ORAL | Status: DC
  Administered 2014-04-11 – 2014-04-15 (×5): 40 mg via ORAL
  Filled 2014-04-10 (×5): qty 1

## 2014-04-10 MED ORDER — SODIUM CHLORIDE 0.9 % IJ SOLN: 3.0000 mL | INTRAMUSCULAR | Status: DC | PRN

## 2014-04-10 MED ORDER — BISACODYL 5 MG PO TBEC
10.0000 mg | DELAYED_RELEASE_TABLET | Freq: Every day | ORAL | Status: DC | PRN
  Administered 2014-04-14: 10 mg via ORAL
  Filled 2014-04-10: qty 2

## 2014-04-10 NOTE — Progress Notes (Addendum)
Patient noted to be in junctional rhythm.  Confirmed per 12 lead.  Patient asymptomatic, denies dizziness, chest discomfort, SOB.  All other parameters of assessment unchanged.  Dr. Servando Snare notified.  Orders rec'd to initiate atrial pacing at 80.  Atrial pacing initiated with atrial capture visible on monitor, Rate 80 MA 10, Atrial sensitivity 0.8.  Continue to monitor.  Explanation for care and emotional support to patient.

## 2014-04-10 NOTE — Progress Notes (Signed)
Patient ID: Timothy Palmer, male   DOB: 1966/05/13, 48 y.o.   MRN: [id number]  [name] Progress Note   Assessment/ Plan:   1 MV endocarditis / MSSA bacteremia- on IV Ancef- for a total of 6 weeks, s/p minimally invasive MV repair (valvuloplasty with pericardial patch repair of posterior leaflet) with RA retrograde cannulation site repair following bleed post-extubation. Chest tube remains in place-removal per thoracic surgery. 2 Septic embolus + mycotic aneurysm LLE- s/p femoral-popliteal BPG 9/18 -clinical with improved perfusion to the distal extremity. 3 ESRD on HD - underwent hemodialysis yesterday with successful ultrafiltration of 4 L-plan for next dialysis tomorrow (continue heparin free given recent events). 4 HTN: Currently off pressors and restarted back on lisinopril 5 Anemia hgb 8.8 today-Aranesp dose increased-s/p PRBCs and FFP for ABLA post-op 6 HPTH:, Continue to monitor on Sensipar and Renvela  Subjective:   Complains of some of her right chest wall pain-denies any shortness of breath. Denies any nausea, vomiting or diarrhea.    Objective:   BP 116/88  Pulse 51  Temp(Src) 97.8 F (36.6 C) (Oral)  Resp 15  Ht 5\' 11"  (1.803 m)  Wt 104.2 kg (229 lb 11.5 oz)  BMI 32.05 kg/m2  SpO2 98%  Physical Exam: Gen: Appears to be comfortable resting in recliner CVS: Pulse regular in rate and rhythm, normal S1 and S2 Resp: Poor inspiratory effort and with decreased breath sounds bilaterally however no rales/rhonchi Abd: Soft, flat, nontender Ext: 2+ left lower extremity edema, 1+-right lower extremity edema. Surgical wounds left leg. Left radiocephalic fistula with well-formed buttonhole cannulation zones   Labs: BMET  Recent Labs Lab 04/04/14 0415 04/04/14 1105 04/05/14 0419  04/06/14 0600 04/06/14 1620 04/06/14 1700 04/07/14 0415 04/07/14 1700 04/07/14 1755 04/08/14 0430 04/09/14 0326 04/10/14 0243  NA 138  --  137  < > 136* 137  --  138  --  137  139 140 136*  K 4.4  --  4.4  < > 5.4* 3.4*  --  3.9  --  4.2 4.5 4.7 4.1  CL 95*  --  94*  < > 101 101  --  100  --  101 99 98 94*  CO2 26  --  29  --  20  --   --  26  --   --  26 26 27   GLUCOSE 90  --  89  < > 177* 98  --  121*  --  112* 74 82 100*  BUN 32*  --  19  < > 27* 11  --  19  --  12 20 31* 29*  CREATININE 10.00*  --  7.01*  < > 6.87* 3.90* 3.70* 5.61* 3.78* 3.60* 5.32* 7.39* 6.20*  CALCIUM 10.6*  --  10.6*  --  9.3  --   --  9.7  --   --  10.2 9.6 10.2  PHOS  --  6.9*  --   --   --   --   --   --   --   --   --   --   --   < > = values in this interval not displayed. CBC  Recent Labs Lab 04/07/14 1700 04/07/14 1755 04/08/14 0430 04/09/14 0326 04/10/14 0243  WBC 17.4*  --  17.1* 14.7* 11.6*  HGB 8.9* 8.8* 8.5* 8.6* 8.8*  HCT 25.4* 26.0* 25.1* 25.4* 25.9*  MCV 86.1  --  87.2 88.5 90.6  PLT 70*  --  79* 93*  144*   Medications:    . acetaminophen  1,000 mg Oral 4 times per day  . amiodarone  200 mg Oral Daily  . aspirin EC  81 mg Oral Daily  . bisacodyl  10 mg Oral Daily   Or  . bisacodyl  10 mg Rectal Daily  .  ceFAZolin (ANCEF) IV  2 g Intravenous Q T,Th,Sat-1800  . chlorhexidine  15 mL Mouth/Throat BID  . cinacalcet  60 mg Oral Daily  . darbepoetin (ARANESP) injection - DIALYSIS  100 mcg Intravenous Q Sat-HD  . docusate sodium  200 mg Oral Daily  . feeding supplement (RESOURCE BREEZE)  1 Container Oral TID BM  . insulin aspart  0-24 Units Subcutaneous TID AC & HS  . lisinopril  10 mg Oral Daily  . pantoprazole  40 mg Oral Daily  . sevelamer carbonate  1,600 mg Oral TID WC  . sodium chloride  3 mL Intravenous Q12H  . sodium chloride  3 mL Intravenous Q12H  . warfarin  2.5 mg Oral q1800  . Warfarin - Physician Dosing Inpatient   Does not apply [id number]   Elmarie Shiley, MD 04/10/2014, 8:32 AM

## 2014-04-10 NOTE — Progress Notes (Signed)
Patient ID: TORIVIO VIROLA, male   DOB: June 04, 1966, 48 y.o.   MRN: [id number] [facility] DAILY ICU PROGRESS NOTE                   [address]            [place] [address]          [phone]   5 Days Post-Op Procedure(s) (LRB): Bring back MINIMALLY INVASIVE MITRAL VALVE (MV) REPLACEMENT - Reexploration for bleeding (Right)  Total Length of Stay:  LOS: 19 days   Subjective: Stable night  dialysis yesterday  Objective: Vital signs in last 24 hours: Temp:  [97.6 F (36.4 C)-98.4 F (36.9 C)] 97.8 F (36.6 C) (10/04 0700) Pulse Rate:  [49-127] 55 (10/04 0800) Cardiac Rhythm:  [-] Normal sinus rhythm;Sinus bradycardia (10/04 0800) Resp:  [9-39] 36 (10/04 0800) BP: (91-200)/(48-88) 125/78 mmHg (10/04 0800) SpO2:  [90 %-100 %] 94 % (10/04 0800) Weight:  [229 lb 11.5 oz (104.2 kg)-233 lb 0.4 oz (105.7 kg)] 229 lb 11.5 oz (104.2 kg) (10/04 0600)  Filed Weights   04/09/14 0822 04/09/14 1222 04/10/14 0600  Weight: 241 lb 13.5 oz (109.7 kg) 233 lb 0.4 oz (105.7 kg) 229 lb 11.5 oz (104.2 kg)    Weight change: 1 lb 1.6 oz (0.5 kg)   Hemodynamic parameters for last 24 hours:    Intake/Output from previous day: 10/03 0701 - 10/04 0700 In: 410 [P.O.:360; IV Piggyback:50] Out: 4280 [Urine:50; Chest Tube:230]  Intake/Output this shift: Total I/O In: 400 [I.V.:400] Out: -   Current Meds: Scheduled Meds: . acetaminophen  1,000 mg Oral 4 times per day  . amiodarone  200 mg Oral Daily  . aspirin EC  81 mg Oral Daily  . bisacodyl  10 mg Oral Daily   Or  . bisacodyl  10 mg Rectal Daily  .  ceFAZolin (ANCEF) IV  2 g Intravenous Q T,Th,Sat-1800  . chlorhexidine  15 mL Mouth/Throat BID  . cinacalcet  60 mg Oral Daily  . darbepoetin (ARANESP) injection - DIALYSIS  100 mcg Intravenous Q Sat-HD  . docusate sodium  200 mg Oral Daily  . feeding supplement (RESOURCE BREEZE)  1 Container Oral TID BM  . insulin aspart  0-24 Units Subcutaneous TID AC & HS  . lisinopril  10  mg Oral Daily  . pantoprazole  40 mg Oral Daily  . sevelamer carbonate  1,600 mg Oral TID WC  . sodium chloride  3 mL Intravenous Q12H  . sodium chloride  3 mL Intravenous Q12H  . warfarin  2.5 mg Oral q1800  . Warfarin - Physician Dosing Inpatient   Does not apply q1800   Continuous Infusions: . sodium chloride 10 mL/hr at 04/10/14 0800  . sodium chloride    . sodium chloride     PRN Meds:.sodium chloride, labetalol, morphine injection, ondansetron (ZOFRAN) IV, oxyCODONE, sodium chloride, traMADol  General appearance: alert and cooperative Neurologic: intact Heart: regular rate and rhythm, S1, S2 normal, no murmur, click, rub or gallop Lungs: diminished breath sounds bibasilar Abdomen: soft, non-tender; bowel sounds normal; no masses,  no organomegaly Extremities: extremities normal, atraumatic, no cyanosis or edema and Homans sign is negative, no sign of DVT Wound: intact Chest tube i place  Lab Results: CBC:  Recent Labs  04/09/14 0326 04/10/14 0243  WBC 14.7* 11.6*  HGB 8.6* 8.8*  HCT 25.4* 25.9*  PLT 93* 144*   BMET:   Recent Labs  04/09/14 0326 04/10/14  0243  NA 140 136*  K 4.7 4.1  CL 98 94*  CO2 26 27  GLUCOSE 82 100*  BUN 31* 29*  CREATININE 7.39* 6.20*  CALCIUM 9.6 10.2    PT/INR:   Recent Labs  04/10/14 0243  LABPROT 18.7*  INR 1.56*   Radiology: Dg Chest Port 1 View  04/10/2014   CLINICAL DATA:  Reassess chest tube ; recent mitral valve repair/ replacement.  EXAM: PORTABLE CHEST - 1 VIEW  COMPARISON:  Portable chest x-ray of April 09, 2014  FINDINGS: The right hemidiaphragm remains elevated. The interstitial markings of the right lung are slightly less conspicuous today. There remains a small right pleural effusion layering low along the lateral thoracic wall. The uppermost chest tube tip overlies the posterior aspect of the right seventh rib. The lowermost chest tube tip projects in the region the medial costophrenic gutter. There is no  pneumothorax.  The left lung is slightly less well inflated today. The interstitial markings remain mildly increased. The cardiopericardial silhouette is enlarged. There is no significant pleural effusion. The bony thorax is unremarkable.  IMPRESSION: There has not been dramatic interval change in the appearance of the chest since yesterday's study. Chest tube positioning is stable. The interstitial markings on the right are slightly less conspicuous today.   Electronically Signed   By: David  Martinique   On: 04/10/2014 08:42   Dg Chest Port 1 View  04/09/2014   CLINICAL DATA:  Followup of atelectasis. History of CHF. Hypertension. Pneumonia.  EXAM: PORTABLE CHEST - 1 VIEW  COMPARISON:  One day prior  FINDINGS: Right chest tubes are unchanged in position. External pacer. Mitral valve repair.  Cardiomegaly accentuated by AP portable technique. Moderate right hemidiaphragm elevation. No pleural effusion or pneumothorax. Patchy right-sided opacities, favored to represent atelectasis. These are not significantly changed. Improved left base atelectasis.  IMPRESSION: Similar right and improved left sided airspace disease, favored to represent atelectasis. Cardiomegaly without congestive failure. Right chest tubes in place without pneumothorax.   Electronically Signed   By: Abigail Miyamoto M.D.   On: 04/09/2014 10:13     Assessment/Plan: S/P Procedure(s) (LRB): Bring back MINIMALLY INVASIVE MITRAL VALVE (MV) REPLACEMENT - Reexploration for bleeding (Right) Doing very well POD 5  Maintaining NSR  Expected post op acute blood loss anemia, stable   Post op thrombocytopenia, mild, improved up to 144,000 Leave chest tubes 240  over 24 hours d/c when output decreased  Continue low dose coumadin  To circle , dialysis tomorrow  Hunter Pinkard B 04/10/2014 9:54 AM

## 2014-04-11 LAB — CBC
HCT: 26.8 % — ABNORMAL LOW (ref 39.0–52.0)
Hemoglobin: 8.9 g/dL — ABNORMAL LOW (ref 13.0–17.0)
MCH: 30.1 pg (ref 26.0–34.0)
MCHC: 33.2 g/dL (ref 30.0–36.0)
MCV: 90.5 fL (ref 78.0–100.0)
Platelets: 183 10*3/uL (ref 150–400)
RBC: 2.96 MIL/uL — ABNORMAL LOW (ref 4.22–5.81)
RDW: 15.4 % (ref 11.5–15.5)
WBC: 13 10*3/uL — ABNORMAL HIGH (ref 4.0–10.5)

## 2014-04-11 LAB — BASIC METABOLIC PANEL
Anion gap: 16 — ABNORMAL HIGH (ref 5–15)
BUN: 42 mg/dL — ABNORMAL HIGH (ref 6–23)
CO2: 27 mEq/L (ref 19–32)
Calcium: 10.2 mg/dL (ref 8.4–10.5)
Chloride: 96 mEq/L (ref 96–112)
Creatinine, Ser: 8.36 mg/dL — ABNORMAL HIGH (ref 0.50–1.35)
GFR calc Af Amer: 8 mL/min — ABNORMAL LOW (ref >90)
GFR calc non Af Amer: 7 mL/min — ABNORMAL LOW (ref >90)
Glucose, Bld: 85 mg/dL (ref 70–99)
Potassium: 4.1 mEq/L (ref 3.7–5.3)
Sodium: 139 mEq/L (ref 137–147)

## 2014-04-11 LAB — PROTIME-INR
INR: 1.67 — ABNORMAL HIGH (ref 0.00–1.49)
Prothrombin Time: 19.7 seconds — ABNORMAL HIGH (ref 11.6–15.2)

## 2014-04-11 LAB — GLUCOSE, CAPILLARY
GLUCOSE-CAPILLARY: 149 mg/dL — AB (ref 70–99)
GLUCOSE-CAPILLARY: 150 mg/dL — AB (ref 70–99)
Glucose-Capillary: 89 mg/dL (ref 70–99)

## 2014-04-11 LAB — BLOOD PRODUCT ORDER (VERBAL) VERIFICATION

## 2014-04-11 MED ORDER — AMIODARONE HCL 200 MG PO TABS
400.0000 mg | ORAL_TABLET | Freq: Two times a day (BID) | ORAL | Status: DC
  Administered 2014-04-11 – 2014-04-13 (×5): 400 mg via ORAL
  Filled 2014-04-11 (×9): qty 2

## 2014-04-11 MED ORDER — LISINOPRIL 20 MG PO TABS: 20.0000 mg | ORAL_TABLET | Freq: Every day | ORAL | Status: DC

## 2014-04-11 MED ORDER — GUAIFENESIN ER 600 MG PO TB12
600.0000 mg | ORAL_TABLET | Freq: Two times a day (BID) | ORAL | Status: DC
  Administered 2014-04-11 – 2014-04-15 (×9): 600 mg via ORAL
  Filled 2014-04-11 (×11): qty 1

## 2014-04-11 MED ORDER — OXYCODONE HCL 5 MG PO TABS
ORAL_TABLET | ORAL | Status: AC
  Administered 2014-04-11: 18:00:00
  Filled 2014-04-11: qty 1

## 2014-04-11 MED ORDER — LISINOPRIL 20 MG PO TABS
20.0000 mg | ORAL_TABLET | Freq: Every day | ORAL | Status: DC
  Administered 2014-04-11 – 2014-04-12 (×2): 20 mg via ORAL
  Filled 2014-04-11 (×3): qty 1

## 2014-04-11 MED ORDER — CEFAZOLIN SODIUM-DEXTROSE 2-3 GM-% IV SOLR
2.0000 g | Freq: Once | INTRAVENOUS | Status: AC
  Administered 2014-04-11: 2 g via INTRAVENOUS
  Filled 2014-04-11: qty 50

## 2014-04-11 MED ORDER — AMIODARONE IV BOLUS ONLY 150 MG/100ML
150.0000 mg | Freq: Once | INTRAVENOUS | Status: AC
  Administered 2014-04-11: 150 mg via INTRAVENOUS
  Filled 2014-04-11: qty 100

## 2014-04-11 NOTE — Progress Notes (Signed)
[name] Progress Note   Subjective: still coughing, no other c/o's, no sob or CP or fever, eating, for HD today  Filed Vitals:   04/10/14 1600 04/10/14 1729 04/10/14 1859 04/10/14 2025  BP:  171/72 159/84 162/77  Pulse: 80 80  80  Temp:  97.9 F (36.6 C)  98.9 F (37.2 C)  TempSrc:  Oral  Oral  Resp: 21 19  18   Height:      Weight:      SpO2: 95% 95%  94%   Exam: Alert, no distress, adult male No jvd Chest coarse rales R base, L clear. R CT in place RRR no MRG Abd soft, +BS, NTND 2+ bilat pretib edema, LLE wounds intact Neuro is nf, ox 3 LFA AVF patent  HD: [facility]   103kgs   2K/2Ca [place]  Heparin 9000 > 1500 mid   500/1.5  Hectorol 9 ug TIW,  Aranesp 25 mcg weekly        Assessment: 1 MV endocarditis / MSSA bacteremia- IV Ancef 6 weeks, s/p minimally invasive MV repair (valvuloplasty with pericardial patch repair of posterior leaflet) with post-op bleed requiring RA retrograde cannulation site repair 2 Septic embolus + mycotic aneurysm LLE- s/p fem-pop BPG 9/18 - stable 3 ESRD on HD - no heparin for now given post op bleeding 4 HTN restarted back on lisinopril  5 Anemia ^'d aranesp dose 6 HPTH cont sensipar and renvela  7 Vol excess reduce vol w HD now that BP's back up   Plan- extra HD today for volume    Kelly Splinter MD  pager [phone]    cell [phone]  04/11/2014, 9:25 AM     Recent Labs Lab 04/04/14 1105  04/09/14 0326 04/10/14 0243 04/11/14 0335  NA  --   < > 140 136* 139  K  --   < > 4.7 4.1 4.1  CL  --   < > 98 94* 96  CO2  --   < > 26 27 27   GLUCOSE  --   < > 82 100* 85  BUN  --   < > 31* 29* 42*  CREATININE  --   < > 7.39* 6.20* 8.36*  CALCIUM  --   < > 9.6 10.2 10.2  PHOS 6.9*  --   --   --   --   < > = values in this interval not displayed.  Recent Labs Lab 04/07/14 0415  AST 38*  ALT <5  ALKPHOS 53  BILITOT 0.3  PROT 5.1*  ALBUMIN 2.0*    Recent Labs Lab 04/09/14 0326 04/10/14 0243 04/11/14 0335   WBC 14.7* 11.6* 13.0*  HGB 8.6* 8.8* 8.9*  HCT 25.4* 25.9* 26.8*  MCV 88.5 90.6 90.5  PLT 93* 144* 183   . amiodarone  150 mg Intravenous Once  . amiodarone  400 mg Oral BID  . aspirin EC  81 mg Oral Daily  .  ceFAZolin (ANCEF) IV  2 g Intravenous Q T,Th,Sat-1800  . chlorhexidine  15 mL Mouth/Throat BID  . cinacalcet  60 mg Oral Daily  . darbepoetin (ARANESP) injection - DIALYSIS  100 mcg Intravenous Q Sat-HD  . docusate sodium  200 mg Oral Daily  . feeding supplement (RESOURCE BREEZE)  1 Container Oral TID BM  . guaiFENesin  600 mg Oral BID  . insulin aspart  0-24 Units Subcutaneous TID AC & HS  . lisinopril  20 mg Oral Daily  . pantoprazole  40 mg  Oral QAC breakfast  . sevelamer carbonate  1,600 mg Oral TID WC  . sodium chloride  3 mL Intravenous Q12H  . warfarin  2.5 mg Oral q1800  . Warfarin - Physician Dosing Inpatient   Does not apply q1800   . sodium chloride Stopped (04/10/14 1000)  . sodium chloride     sodium chloride, sodium chloride, bisacodyl, bisacodyl, oxyCODONE, sodium chloride, traMADol

## 2014-04-11 NOTE — Procedures (Signed)
I was present at this dialysis session, have reviewed the session itself and made  appropriate changes  Kelly Splinter MD (pgr) [phone]    [phone] 04/11/2014, 2:11 PM

## 2014-04-11 NOTE — Progress Notes (Signed)
CARDIAC REHAB PHASE I   PRE:  Rate/Rhythm: 80 paced then 81 afib with pacer off    BP: sitting 191/71    SaO2: 93 RA  MODE:  Ambulation: 790 ft   POST:  Rate/Rhythm: 93 afib    BP: sitting 125/49     SaO2: 95 RA  Pt reluctant to walk, much resistance verbally, but willing. Needed encouragement to increase distance. Steady with RW. No problems. Increased distance easily. Moving in and out of bed with ease. Encouraged at least one more walk today (has dialysis and CT to be pulled).  [place], [place], ACSM 04/11/2014 9:13 AM

## 2014-04-11 NOTE — Progress Notes (Signed)
Orders to remove pt chest tubes after dialysis, pt did not come back to unit until 0630. Called Dr. Ricard Dillon, orders were given to remove chest tubes in am. Monitoring will continue.

## 2014-04-11 NOTE — Progress Notes (Addendum)
[address]       [place] [address]             [phone]          6 Days Post-Op Procedure(s) (LRB): Bring back MINIMALLY INVASIVE MITRAL VALVE (MV) REPLACEMENT - Reexploration for bleeding (Right)  Subjective: Sore at CT site, has been coughing a lot, mostly clear thick sputum.   Objective: Vital signs in last 24 hours: Patient Vitals for the past 24 hrs:  BP Temp Temp src Pulse Resp SpO2  04/10/14 2025 162/77 mmHg 98.9 F (37.2 C) Oral 80 18 94 %  04/10/14 1859 159/84 mmHg - - - - -  04/10/14 1729 171/72 mmHg 97.9 F (36.6 C) Oral 80 19 95 %  04/10/14 1600 - - - 80 21 95 %  04/10/14 1500 - 98.2 F (36.8 C) Oral 80 30 96 %  04/10/14 1400 110/52 mmHg - - 78 32 99 %  04/10/14 1345 - - - 79 22 95 %  04/10/14 1331 - - - 56 25 97 %  04/10/14 1300 126/76 mmHg - - 63 28 94 %  04/10/14 1200 118/85 mmHg - - 85 19 97 %  04/10/14 1100 115/70 mmHg 97.9 F (36.6 C) Oral 63 33 93 %  04/10/14 1000 140/82 mmHg - - 54 27 92 %  04/10/14 0952 124/74 mmHg - - 51 32 96 %  04/10/14 0800 125/78 mmHg - - 55 36 94 %   Current Weight  04/10/14 229 lb 11.5 oz (104.2 kg)  BASELINE WEIGHT: 96 kg    Intake/Output from previous day: 10/04 0701 - 10/05 0700 In: 800 [P.O.:400; I.V.:400] Out: 100 [Chest Tube:100]    PHYSICAL EXAM:  Heart: RRR, brady 58-60 under pacer Lungs: Coarse BS and few BS crackles bilaterally Wound: Intact, small amount serosanguinous drainage from lateral portion of thoracotomy, no erythema Extremities: Mild LE edema    Lab Results: CBC: Recent Labs  04/10/14 0243 04/11/14 0335  WBC 11.6* 13.0*  HGB 8.8* 8.9*  HCT 25.9* 26.8*  PLT 144* 183   BMET:  Recent Labs  04/10/14 0243 04/11/14 0335  NA 136* 139  K 4.1 4.1  CL 94* 96  CO2 27 27  GLUCOSE 100* 85  BUN 29* 42*  CREATININE 6.20* 8.36*  CALCIUM 10.2 10.2    PT/INR:  Recent Labs  04/11/14 0335  LABPROT 19.7*  INR 1.67*      Assessment/Plan: S/P  Procedure(s) (LRB): Bring back MINIMALLY INVASIVE MITRAL VALVE (MV) REPLACEMENT - Reexploration for bleeding (Right)  CV- postop AF, had junctional bradycardia last night and placed on pacer at 80. Presently, he appears to be sinus brady 58-60. Will leave pacer on for now and watch.  BPs trending up, lisinopril restarted by renal. Continue Coumadin load.  CT output documented at 100 ml/past 24 hrs.  Hopefully can d/c CT soon.  Pulm- increase pulm toilet, will add Mucinex for secretions.  Expected postop blood loss anemia- H/H stable, on Aranesp.  ESRD- for HD today.  ID- Continue Ancef, WBC up slightly today- possibly atelectasis.  Will follow.  GI - no BM yet, LOC.  CRPI/ambulation.   LOS: 20 days    COLLINS,GINA H 04/11/2014  I have seen and examined the patient and agree with the assessment and plan as outlined.  Went back into Afib this morning w/ controlled rate.  Will give 150 mg bolus and increase oral dose of amiodarone.  Overall looks  good.  Increase lisinopril back to preop dose.  Will d/c chest tubes if stable after return from HD today.  Recheck WBC and CXR in am.  Consider broadening antibiotics for possible HCAP if WBC rises further, cough persists and/or CXR suggestive of pneumonia.  [name],[name] 04/11/2014 9:11 AM

## 2014-04-12 ENCOUNTER — Inpatient Hospital Stay (HOSPITAL_COMMUNITY): Payer: Medicare Other

## 2014-04-12 LAB — RENAL FUNCTION PANEL
ALBUMIN: 2.2 g/dL — AB (ref 3.5–5.2)
Anion gap: 19 — ABNORMAL HIGH (ref 5–15)
BUN: 34 mg/dL — ABNORMAL HIGH (ref 6–23)
CO2: 25 mEq/L (ref 19–32)
CREATININE: 6.96 mg/dL — AB (ref 0.50–1.35)
Calcium: 9.8 mg/dL (ref 8.4–10.5)
Chloride: 95 mEq/L — ABNORMAL LOW (ref 96–112)
GFR calc Af Amer: 10 mL/min — ABNORMAL LOW (ref >90)
GFR calc non Af Amer: 8 mL/min — ABNORMAL LOW (ref >90)
Glucose, Bld: 119 mg/dL — ABNORMAL HIGH (ref 70–99)
PHOSPHORUS: 5.1 mg/dL — AB (ref 2.3–4.6)
Potassium: 3.5 mEq/L — ABNORMAL LOW (ref 3.7–5.3)
Sodium: 139 mEq/L (ref 137–147)

## 2014-04-12 LAB — CBC
HCT: 27.6 % — ABNORMAL LOW (ref 39.0–52.0)
HEMATOCRIT: 27.1 % — AB (ref 39.0–52.0)
Hemoglobin: 9.2 g/dL — ABNORMAL LOW (ref 13.0–17.0)
Hemoglobin: 9 g/dL — ABNORMAL LOW (ref 13.0–17.0)
MCH: 29.7 pg (ref 26.0–34.0)
MCH: 30.2 pg (ref 26.0–34.0)
MCHC: 33.3 g/dL (ref 30.0–36.0)
MCHC: 33.2 g/dL (ref 30.0–36.0)
MCV: 89 fL (ref 78.0–100.0)
MCV: 90.9 fL (ref 78.0–100.0)
PLATELETS: 230 10*3/uL (ref 150–400)
PLATELETS: 258 10*3/uL (ref 150–400)
RBC: 3.1 MIL/uL — ABNORMAL LOW (ref 4.22–5.81)
RBC: 2.98 MIL/uL — ABNORMAL LOW (ref 4.22–5.81)
RDW: 15.4 % (ref 11.5–15.5)
RDW: 15.3 % (ref 11.5–15.5)
WBC: 11.3 10*3/uL — ABNORMAL HIGH (ref 4.0–10.5)
WBC: 11.3 10*3/uL — AB (ref 4.0–10.5)

## 2014-04-12 LAB — PROTIME-INR
INR: 1.51 — ABNORMAL HIGH (ref 0.00–1.49)
Prothrombin Time: 18.2 seconds — ABNORMAL HIGH (ref 11.6–15.2)

## 2014-04-12 LAB — PRO B NATRIURETIC PEPTIDE: Pro B Natriuretic peptide (BNP): 22698 pg/mL — ABNORMAL HIGH (ref 0–125)

## 2014-04-12 LAB — GLUCOSE, CAPILLARY
GLUCOSE-CAPILLARY: 121 mg/dL — AB (ref 70–99)
Glucose-Capillary: 94 mg/dL (ref 70–99)
Glucose-Capillary: 140 mg/dL — ABNORMAL HIGH (ref 70–99)
Glucose-Capillary: 96 mg/dL (ref 70–99)

## 2014-04-12 MED ORDER — WARFARIN SODIUM 4 MG PO TABS
4.0000 mg | ORAL_TABLET | Freq: Every day | ORAL | Status: DC
  Administered 2014-04-12: 4 mg via ORAL
  Filled 2014-04-12 (×2): qty 1

## 2014-04-12 NOTE — Progress Notes (Signed)
Chest tube removed per order and protocol, pt tolerated well, Vaseline gauze and occlusive dressing, will continue to monitor closely Rickard Rhymes, RN

## 2014-04-12 NOTE — Discharge Instructions (Addendum)
Thoracotomy, Care After  Refer to this sheet in the next few weeks. These instructions provide you with information on caring for yourself after your procedure. Your health care provider may also give you more specific instructions. Your treatment has been planned according to current medical practices, but problems sometimes occur. Call your health care provider if you have any problems or questions after your procedure.  WHAT TO EXPECT AFTER YOUR PROCEDURE  After your procedure, it is typical to have the following sensations:  You may feel pain at the incision site.  You may be constipated from the pain medicine given and the change in your level of activity.  You may feel extremely tired. HOME CARE INSTRUCTIONS  Take over-the-counter or prescription medicines for pain, discomfort, or fever only as directed by your health care provider. It is very important to take pain relieving medicine before your pain becomes severe. You will be able to breathe and cough more comfortably if your pain is well controlled.  Take deep breaths. Deep breathing helps to keep your lungs inflated and protects against a lung infection (pneumonia).  Cough frequently. Even though coughing may cause discomfort, coughing is important to clear mucus (phlegm) and expand your lungs. Coughing helps prevent pneumonia. If it hurts to cough, hold a pillow against your chest when you cough. This may help with the discomfort.  Continue to use an incentive spirometer as directed. The use of an incentive spirometer helps to keep your lungs inflated and protects against pneumonia.  Change the bandages over your incision as needed or as directed by your health care provider.  Remove the bandages over your chest tube site as directed by your health care provider.  Resume your normal diet as directed. It is important to have adequate protein, calories, vitamins, and minerals to promote healing.  Prevent constipation.  Eat high-fiber foods  such as whole grain cereals and breads, brown rice, beans, and fresh fruits and vegetables.  Drink enough water and fluids to keep your urine clear or pale yellow. Avoid drinking beverages containing caffeine. Beverages containing caffeine can cause dehydration and harden your stool.  Talk to your health care provider about taking a stool softener or laxative. Avoid lifting until you are instructed otherwise.  Do not drive until directed by your health care provider. Do not drive while taking pain medicines (narcotics).  Do not bathe, swim, or use a hot tub until directed by your health care provider. You may shower instead. Gently wash the area of your incision with water and soap as directed. Do not use anything else to clean your incision except as directed by your health care provider.  Do not use any tobacco products including cigarettes, chewing tobacco, or electronic cigarettes.  Avoid secondhand smoke.  Schedule an appointment for stitch (suture) or staple removal as directed.  Schedule and attend all follow-up visits as directed by your health care provider. It is important to keep all your appointments.  Participate in pulmonary rehabilitation as directed by your health care provider.  Do not travel by airplane for 2 weeks after your chest tube is removed. SEEK MEDICAL CARE IF:  You are bleeding from your wounds.  Your heartbeat seems irregular.  You have redness, swelling, or increasing pain in the wounds.  There is pus coming from your wounds.  There is a bad smell coming from the wound or dressing.  You have a fever or chills.  You have nausea or are vomiting.  You have muscle aches.  SEEK IMMEDIATE MEDICAL CARE IF:  You have a rash.  You have difficulty breathing.  You have a reaction or side effect to medicines given.  You have persistent nausea.  You have lightheadedness or feel faint.  You have shortness of breath or chest pain.  You have persistent pain. Document  Released: 12/07/2010 Document Revised: 06/29/2013 Document Reviewed: 02/10/2013  [facility] Patient Information 2015 [place], [place]. This information is not intended to replace advice given to you by your health care provider. Make sure you discuss any questions you have with your health care provider.       Information on my medicine - Coumadin   (Warfarin)  This medication education was reviewed with me or my healthcare representative as part of my discharge preparation.  The pharmacist that spoke with me during my hospital stay was:  Georgina Peer, [place]  Why was Coumadin prescribed for you? Coumadin was prescribed for you because you have a blood clot or a medical condition that can cause an increased risk of forming blood clots. Blood clots can cause serious health problems by blocking the flow of blood to the heart, lung, or brain. Coumadin can prevent harmful blood clots from forming. As a reminder your indication for Coumadin is:   Deep Vein Thrombosis Treatment  What test will check on my response to Coumadin? While on Coumadin (warfarin) you will need to have an INR test regularly to ensure that your dose is keeping you in the desired range. The INR (international normalized ratio) number is calculated from the result of the laboratory test called prothrombin time (PT).  If an INR APPOINTMENT HAS NOT ALREADY BEEN MADE FOR YOU please schedule an appointment to have this lab work done by your health care provider within 7 days. Ask your health care provider during an office visit what your goal INR is.  What  do you need to  know  About  COUMADIN? Take Coumadin (warfarin) exactly as prescribed by your healthcare provider about the same time each day.  DO NOT stop taking without talking to the doctor who prescribed the medication.  Stopping without other blood clot prevention medication to take the place of Coumadin may increase your risk of developing a new clot or stroke.  Get  refills before you run out.  What do you do if you miss a dose? If you miss a dose, take it as soon as you remember on the same day then continue your regularly scheduled regimen the next day.  Do not take two doses of Coumadin at the same time.  Important Safety Information A possible side effect of Coumadin (Warfarin) is an increased risk of bleeding. You should call your healthcare provider right away if you experience any of the following:   Bleeding from an injury or your nose that does not stop.   Unusual colored urine (red or dark brown) or unusual colored stools (red or black).   Unusual bruising for unknown reasons.   A serious fall or if you hit your head (even if there is no bleeding).  Some foods or medicines interact with Coumadin (warfarin) and might alter your response to warfarin. To help avoid this:   Eat a balanced diet, maintaining a consistent amount of Vitamin K.   Notify your provider about major diet changes you plan to make.   Avoid alcohol or limit your intake to 1 drink for women and 2 drinks for men per day. (1 drink is 5 oz. wine, 12  oz. beer, or 1.5 oz. liquor.)  Make sure that ANY health care provider who prescribes medication for you knows that you are taking Coumadin (warfarin).  Also make sure the healthcare provider who is monitoring your Coumadin knows when you have started a new medication including herbals and non-prescription products.  Coumadin (Warfarin)  Major Drug Interactions  Increased Warfarin Effect Decreased Warfarin Effect  Alcohol (large quantities) Antibiotics (esp. Septra/Bactrim, Flagyl, Cipro) Amiodarone (Cordarone) Aspirin (ASA) Cimetidine (Tagamet) Megestrol (Megace) NSAIDs (ibuprofen, naproxen, etc.) Piroxicam (Feldene) Propafenone (Rythmol SR) Propranolol (Inderal) Isoniazid (INH) Posaconazole (Noxafil) Barbiturates (Phenobarbital) Carbamazepine (Tegretol) Chlordiazepoxide (Librium) Cholestyramine  (Questran) Griseofulvin Oral Contraceptives Rifampin Sucralfate (Carafate) Vitamin K   Coumadin (Warfarin) Major Herbal Interactions  Increased Warfarin Effect Decreased Warfarin Effect  Garlic Ginseng Ginkgo biloba Coenzyme Q10 Green tea St. Johns wort    Coumadin (Warfarin) FOOD Interactions  Eat a consistent number of servings per week of foods HIGH in Vitamin K (1 serving =  cup)  Collards (cooked, or boiled & drained) Kale (cooked, or boiled & drained) Mustard greens (cooked, or boiled & drained) Parsley *serving size only =  cup Spinach (cooked, or boiled & drained) Swiss chard (cooked, or boiled & drained) Turnip greens (cooked, or boiled & drained)  Eat a consistent number of servings per week of foods MEDIUM-HIGH in Vitamin K (1 serving = 1 cup)  Asparagus (cooked, or boiled & drained) Broccoli (cooked, boiled & drained, or raw & chopped) Brussel sprouts (cooked, or boiled & drained) *serving size only =  cup Lettuce, raw (green leaf, endive, romaine) Spinach, raw Turnip greens, raw & chopped   These websites have more information on Coumadin (warfarin):  FailFactory.se; VeganReport.com.au;

## 2014-04-12 NOTE — Progress Notes (Signed)
CARDIAC REHAB PHASE I   PRE:  Rate/Rhythm: 77 afib/flutter  BP:  Supine:   Sitting: 133/86  Standing:    SaO2: 97%RA  MODE:  Ambulation: 840 ft   POST:  Rate/Rhythm: 97 afib/flutter  Went to SB 55 after resting when I was writing note  BP:  Supine:   Sitting: 121/52  Standing:    SaO2: 91-92%RA 0950-1015 Pt ready to walk and motivated. Walked 840 ft on RA with rolling walker(do not think he will need walker for home) with steady gait. Tolerated well. No complaints except tired end of walk. To recliner with call bell. Checked rate again as writing this note and now 55 SB.   Graylon Good, RN BSN  04/12/2014 10:11 AM

## 2014-04-12 NOTE — Progress Notes (Addendum)
[address]       [place] [address]             [phone]      7 Days Post-Op Procedure(s) (LRB): Bring back MINIMALLY INVASIVE MITRAL VALVE (MV) REPLACEMENT - Reexploration for bleeding (Right) Subjective: Feeling better   Objective: Vital signs in last 24 hours: Temp:  [97.6 F (36.4 C)-98.7 F (37.1 C)] 98.4 F (36.9 C) (10/06 0609) Pulse Rate:  [57-94] 74 (10/06 0609) Cardiac Rhythm:  [-] Normal sinus rhythm (10/05 1935) Resp:  [20-32] 20 (10/06 0609) BP: (103-173)/(45-88) 115/47 mmHg (10/06 0609) SpO2:  [96 %-100 %] 96 % (10/06 0609) Weight:  [220 lb 10.9 oz (100.1 kg)-232 lb 12.9 oz (105.6 kg)] 220 lb 10.9 oz (100.1 kg) (10/06 [id number])  Hemodynamic parameters for last 24 hours:    Intake/Output from previous day: 10/05 0701 - 10/06 0700 In: -  Out: 4193 [Chest Tube:200] Intake/Output this shift:    General appearance: alert, cooperative and no distress Heart: regular rate and rhythm and brady Lungs: dim right base Abdomen: soft, nontender Extremities: + BLE edema Wound: incis healing well  Lab Results:  Recent Labs  04/11/14 0335 04/12/14 0457  WBC 13.0* 11.3*  HGB 8.9* 9.2*  HCT 26.8* 27.6*  PLT 183 230   BMET:  Recent Labs  04/10/14 0243 04/11/14 0335  NA 136* 139  K 4.1 4.1  CL 94* 96  CO2 27 27  GLUCOSE 100* 85  BUN 29* 42*  CREATININE 6.20* 8.36*  CALCIUM 10.2 10.2    PT/INR:  Recent Labs  04/12/14 0457  LABPROT 18.2*  INR 1.51*   ABG    Component Value Date/Time   PHART 7.482* 04/07/2014 0857   HCO3 30.2* 04/07/2014 0857   TCO2 26 04/07/2014 1755   ACIDBASEDEF 1.0 04/06/2014 0524   O2SAT 99.0 04/07/2014 0857   CBG (last 3)   Recent Labs  04/11/14 1224 04/11/14 2042 04/12/14 0607  GLUCAP 149* 150* 94    Meds Scheduled Meds: . amiodarone  400 mg Oral BID  . aspirin EC  81 mg Oral Daily  .  ceFAZolin (ANCEF) IV  2 g Intravenous Q T,Th,Sat-1800  . chlorhexidine  15 mL Mouth/Throat BID  .  cinacalcet  60 mg Oral Daily  . darbepoetin (ARANESP) injection - DIALYSIS  100 mcg Intravenous Q Sat-HD  . docusate sodium  200 mg Oral Daily  . feeding supplement (RESOURCE BREEZE)  1 Container Oral TID BM  . guaiFENesin  600 mg Oral BID  . insulin aspart  0-24 Units Subcutaneous TID AC & HS  . lisinopril  20 mg Oral Daily  . pantoprazole  40 mg Oral QAC breakfast  . sevelamer carbonate  1,600 mg Oral TID WC  . sodium chloride  3 mL Intravenous Q12H  . warfarin  2.5 mg Oral q1800  . Warfarin - Physician Dosing Inpatient   Does not apply q1800   Continuous Infusions: . sodium chloride Stopped (04/10/14 1000)  . sodium chloride     PRN Meds:.sodium chloride, sodium chloride, bisacodyl, bisacodyl, oxyCODONE, sodium chloride, traMADol  Xrays No results found.  Assessment/Plan: S/P Procedure(s) (LRB): Bring back MINIMALLY INVASIVE MITRAL VALVE (MV) REPLACEMENT - Reexploration for bleeding (Right)  1 still with intermitt afib, current sinus brady, BP is pretty well controlled 2 d/c chest tube today 3 cont to push rehab/pulm toilet as able, no fevers, leukocytosis improving, on IV ancef 4 INR lower, will increase coumadin dose  5 H/H sl improved 6 creat not checked today- nephrology managing dialysis    LOS: 21 days    GOLD,WAYNE E 04/12/2014  I have seen and examined the patient and agree with the assessment and plan as outlined.  Tessia Kassin H 04/12/2014 10:46 AM

## 2014-04-12 NOTE — Progress Notes (Signed)
Subjective:  Sitting in chair, mild soreness after chest tube removal this morning, no dyspnea, chest pain during the end of dialysis yesterday  Objective: Vital signs in last 24 hours: Temp:  [97.6 F (36.4 C)-98.7 F (37.1 C)] 98.4 F (36.9 C) (10/06 0609) Pulse Rate:  [57-94] 86 (10/06 0854) Resp:  [20-32] 20 (10/06 0609) BP: (103-173)/(45-88) 133/86 mmHg (10/06 0854) SpO2:  [96 %-100 %] 97 % (10/06 0854) Weight:  [100.1 kg (220 lb 10.9 oz)-105.6 kg (232 lb 12.9 oz)] 100.1 kg (220 lb 10.9 oz) (10/06 0609) Weight change: 1.499 kg (3 lb 4.9 oz)  Intake/Output from previous day: 10/05 0701 - 10/06 0700 In: -  Out: [id number] [Chest Tube:200] Intake/Output this shift: Total I/O In: 240 [P.O.:240] Out: -   Lab Results:  Recent Labs  04/11/14 0335 04/12/14 0457  WBC 13.0* 11.3*  HGB 8.9* 9.2*  HCT 26.8* 27.6*  PLT 183 230   BMET:  Recent Labs  04/10/14 0243 04/11/14 0335  NA 136* 139  K 4.1 4.1  CL 94* 96  CO2 27 27  GLUCOSE 100* 85  BUN 29* 42*  CREATININE 6.20* 8.36*  CALCIUM 10.2 10.2   No results found for this basename: PTH,  in the last 72 hours Iron Studies: No results found for this basename: IRON, TIBC, TRANSFERRIN, FERRITIN,  in the last 72 hours  Studies/Results: No results found.  EXAM: General appearance:  Alert, in no apparent distress Resp: Diminished breath sounds @ R base; otherwise, clear Cardio: RRR without murmur or rub GI: + BS, soft and nontender Extremities:  Trace LE edema Access:  AVF @ LFA with + bruit  HD: [facility]  4h 103kgs 2K/2Ca Bath Heparin 9000 > 1500 mid 500/1.5  Hectorol 9 ug TIW, Aranesp 25 mcg weekly  Assessment/Plan: 1. Mitral valve endocarditis / MSSA bacteremia - per TEE 9/17; minimally-invasive mitral valve repair (valvuloplasty with autologous pericardial patch repair of perforation of posterior leaflet) 9/29 with RA retrograde cannulation site repair following bleed post-extubation, chest tube removed today, Ancef x  6 wks (through 10/20). 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 Donnetta Hutching.  3. ESRD - HD on [facility], K 4.1. HD pending today.  4. HTN/Volume - BP 133/86 on Lisinopril 20 mg qd; wt 100.1 kg, below EDW, s/p net UF 4 L yesterday, but CP @ end.  5. Anemia - Hgb 9.2 (s/p 2 U PRBCs 9/19), Aranesp 100 mcg on Sat. 6. 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  7. Nutrition - Alb 2.1, renal diet, multivitamin, supplement.     LOS: 21 days   LYLES,CHARLES 04/12/2014,10:53 AM  Pt seen, examined and agree w A/P as above. Appear to be approaching new dry wt. BP meds decreased and is now back on home regimen of lisinopril 20 mg daily.  Kelly Splinter MD pager [phone]    cell [phone] 04/12/2014, 2:41 PM

## 2014-04-12 NOTE — Progress Notes (Signed)
Pacing wires removed per order and protocol, pt tolerated well, pt made aware of bedrest for 1 hour and stated understanding, wires intact upon removal, will continue to monitor closely Rickard Rhymes, RN

## 2014-04-12 NOTE — Progress Notes (Signed)
Confirmed with [place] PA to pull both chest tubes on the right side and for chest xray in AM [place], Karren Burly

## 2014-04-13 ENCOUNTER — Inpatient Hospital Stay (HOSPITAL_COMMUNITY): Payer: Medicare Other

## 2014-04-13 LAB — GLUCOSE, CAPILLARY
GLUCOSE-CAPILLARY: 118 mg/dL — AB (ref 70–99)
Glucose-Capillary: 102 mg/dL — ABNORMAL HIGH (ref 70–99)
Glucose-Capillary: 130 mg/dL — ABNORMAL HIGH (ref 70–99)
Glucose-Capillary: 96 mg/dL (ref 70–99)

## 2014-04-13 LAB — PROTIME-INR
INR: 1.58 — ABNORMAL HIGH (ref 0.00–1.49)
Prothrombin Time: 18.9 seconds — ABNORMAL HIGH (ref 11.6–15.2)

## 2014-04-13 MED ORDER — WARFARIN SODIUM 5 MG PO TABS
5.0000 mg | ORAL_TABLET | Freq: Every day | ORAL | Status: DC
  Administered 2014-04-13 – 2014-04-14 (×2): 5 mg via ORAL
  Filled 2014-04-13 (×3): qty 1

## 2014-04-13 MED ORDER — LISINOPRIL 40 MG PO TABS
40.0000 mg | ORAL_TABLET | Freq: Every day | ORAL | Status: DC
  Administered 2014-04-13: 40 mg via ORAL
  Filled 2014-04-13: qty 1

## 2014-04-13 MED ORDER — LISINOPRIL 40 MG PO TABS
40.0000 mg | ORAL_TABLET | Freq: Every day | ORAL | Status: DC
  Administered 2014-04-14: 40 mg via ORAL
  Filled 2014-04-13 (×2): qty 1

## 2014-04-13 NOTE — Progress Notes (Signed)
Subjective:  UF 900 cc yest with HD, today stil coughing up clear phlegm, CXR shows persistent elevation R hemidiaphragm, no CHF  Objective: Vital signs in last 24 hours: Temp:  [98 F (36.7 C)-98.4 F (36.9 C)] 98.2 F (36.8 C) (10/07 0448) Pulse Rate:  [50-79] 50 (10/07 0808) Resp:  [18-25] 18 (10/07 0448) BP: (96-183)/(25-83) 150/67 mmHg (10/07 0808) SpO2:  [97 %-100 %] 100 % (10/07 0448) Weight:  [98.2 kg (216 lb 7.9 oz)-100.3 kg (221 lb 1.9 oz)] 100.3 kg (221 lb 1.9 oz) (10/07 0426) Weight change: -7.4 kg (-16 lb 5 oz)  Intake/Output from previous day: 10/06 0701 - 10/07 0700 In: 240 [P.O.:240] Out: 894  Intake/Output this shift: Total I/O In: 60 [P.O.:60] Out: -   Lab Results:  Recent Labs  04/12/14 0457 04/12/14 1350  WBC 11.3* 11.3*  HGB 9.2* 9.0*  HCT 27.6* 27.1*  PLT 230 258   BMET:   Recent Labs  04/11/14 0335 04/12/14 1350  NA 139 139  K 4.1 3.5*  CL 96 95*  CO2 27 25  GLUCOSE 85 119*  BUN 42* 34*  CREATININE 8.36* 6.96*  CALCIUM 10.2 9.8  ALBUMIN  --  2.2*   No results found for this basename: PTH,  in the last 72 hours Iron Studies: No results found for this basename: IRON, TIBC, TRANSFERRIN, FERRITIN,  in the last 72 hours  Studies/Results: Dg Chest 2 View  04/12/2014   CLINICAL DATA:  Increasing shortness of breath  EXAM: CHEST  2 VIEW  COMPARISON:  April 10, 2014  FINDINGS: Chest tubes remain on the right, not appreciably changed in position. Temporary pacemaker wires are attached to the right heart. There is a minimal right apical pneumothorax. There is subcutaneous air on the right. There is persistent elevation the right hemidiaphragm. There is a right pleural effusion which appears partially loculated, stable. Areas of patchy atelectasis are noted throughout the right lung. Left lung is clear. Heart is mildly enlarged with pulmonary vascularity within normal limits. No adenopathy.  IMPRESSION: Persistent effusion on the right which appears  at least partially loculated. Areas of atelectasis on the right are stable. Chest tubes remain on the right. There is currently a minimal right apical pneumothorax without tension component. Left lung clear.   Electronically Signed   By: Lowella Grip M.D.   On: 04/12/2014 08:16    EXAM: General appearance:  Alert, in no apparent distress Resp: Diminished breath sounds @ R base; otherwise, clear Cardio: RRR without murmur or rub GI: + BS, soft and nontender Extremities: 1+ bilat pretib LE edema Access:  AVF @ LFA with + bruit  HD: [facility]  4h 103kgs 2K/2Ca Bath Heparin 9000 > 1500 mid 500/1.5  LFA AVF Hectorol 9 ug TIW, Aranesp 25 mcg weekly  Assessment: 1. Mitral valve endocarditis / MSSA bacteremia - per TEE 9/17; minimally-invasive mitral valve repair (valvuloplasty with autologous pericardial patch repair of perforation of posterior leaflet) 9/29 with RA retrograde cannulation site repair following bleed post-extubation, chest tube removed today, Ancef x 6 wks (through 10/20). 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 Donnetta Hutching.  3. ESRD on HD 4. HTN/Volume - on lisinopril 20/d, has some extra vol still 5. Anemia - Hgb 9.2 (s/p 2 U PRBCs 9/19), Aranesp 100 mcg on Sat. 6. 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  7. Nutrition - Alb 2.1, renal diet,  multivitamin, supplement.   Plan - HD tomorrow, try UF towards 96 kg, he still has some LE edema

## 2014-04-13 NOTE — Progress Notes (Signed)
CARDIAC REHAB PHASE I   PRE:  Rate/Rhythm: 50 SB, 47 Afib    BP: sitting 96/76    SaO2: 97 RA  MODE:  Ambulation: 790 ft   POST:  Rate/Rhythm: 68 afib    BP: sitting 161/86     SaO2: 97 RA  Pt moving very well. Used RW, independent with walking. Mostly afib, see above. To recliner in new room. Got RW for room and encouraged x2 more walks today independent. Pt reluctant. Spent time talking/listening to pt with recent grief.  [id number]   Timothy Palmer [place] CES, ACSM 04/13/2014 11:09 AM

## 2014-04-13 NOTE — Progress Notes (Signed)
NUTRITION FOLLOW UP  Intervention:   Continue Resource Breeze po TID, each supplement provides 250 kcal and 9 grams of protein RD to follow for nutrition care plan  Nutrition Dx:   Increased nutrient needs related to chronic illness, ESRD as evidenced by estimated nutrition needs, ongoing  Goal:  Pt to meet >/= 90% of their estimated nutrition needs, progressing  Monitor:   PO & supplemental intake, weight, labs, I/O's  Assessment:   48 year old male with PMH of HTN, ESRD on HD p/w worsening cough, SOB 1 day. Pt states that approx 3 weeks ago he developed a cough productive of yellowish sputum assoc with SOB. showed a small vegetation on the posterior leaflet possible moderate to severe MR (not well-visualized), leading to concern for endocarditis with valve perforation.  Patient s/p procedure 9/21: LEFT AND RIGHT HEART CATHETERIZATION WITH CORONARY ANGIOGRAM  Patient s/p procedure 9/30: MINIMALLY INVASIVE MITRAL VALVE REPLACEMENT  RE-EXPLORATION FOR BLEEDING  Patient extubated 10/2.  Patient currently on a Renal/Carbohydrate Modified diet with 1200 ml fluid restriction.  PO intake variable at 25-100% per flowsheet records.  Receiving Resource Breeze supplements TID; states it's too sweet but would like to continue with it.  Height: Ht Readings from Last 1 Encounters:  04/05/14 5\' 11"  (1.803 m)    Weight Status:   Wt Readings from Last 1 Encounters:  04/13/14 221 lb 1.9 oz (100.3 kg)    Re-estimated needs:  Kcal: 2150-2350  Protein: 105-115 grams  Fluid: 1.2 L/day  Skin: +1 RLE edema, +2 RLE edema; closed incision on left leg  Diet Order: Renal/Carb Modified    Intake/Output Summary (Last 24 hours) at 04/13/14 1409 Last data filed at 04/13/14 0747  Gross per 24 hour  Intake     60 ml  Output    894 ml  Net   -834 ml   Labs:   Recent Labs Lab 04/06/14 1700  04/07/14 1700  04/10/14 0243 04/11/14 0335 04/12/14 1350  NA  --   < >  --   < > 136* 139 139   K  --   < >  --   < > 4.1 4.1 3.5*  CL  --   < >  --   < > 94* 96 95*  CO2  --   < >  --   < > 27 27 25   BUN  --   < >  --   < > 29* 42* 34*  CREATININE 3.70*  < > 3.78*  < > 6.20* 8.36* 6.96*  CALCIUM  --   < >  --   < > 10.2 10.2 9.8  MG 1.7  --  1.8  --   --   --   --   PHOS  --   --   --   --   --   --  5.1*  GLUCOSE  --   < >  --   < > 100* 85 119*  < > = values in this interval not displayed.  CBG (last 3)   Recent Labs  04/12/14 2026 04/13/14 0636 04/13/14 1103  GLUCAP 121* 102* 118*    Scheduled Meds: . amiodarone  400 mg Oral BID  . aspirin EC  81 mg Oral Daily  .  ceFAZolin (ANCEF) IV  2 g Intravenous Q T,Th,Sat-1800  . chlorhexidine  15 mL Mouth/Throat BID  . cinacalcet  60 mg Oral Daily  . darbepoetin (ARANESP) injection - DIALYSIS  100 mcg  Intravenous Q Sat-HD  . docusate sodium  200 mg Oral Daily  . feeding supplement (RESOURCE BREEZE)  1 Container Oral TID BM  . guaiFENesin  600 mg Oral BID  . insulin aspart  0-24 Units Subcutaneous TID AC & HS  . lisinopril  40 mg Oral Daily  . pantoprazole  40 mg Oral QAC breakfast  . sevelamer carbonate  1,600 mg Oral TID WC  . warfarin  5 mg Oral q1800  . Warfarin - Physician Dosing Inpatient   Does not apply q1800    Continuous Infusions: . sodium chloride      Arthur Holms, RD, LDN Pager #: [phone] After-Hours Pager #: [phone]

## 2014-04-13 NOTE — Progress Notes (Signed)
Pt states that he does not want to walk tonight and that he will resume tomorrow.

## 2014-04-13 NOTE — Progress Notes (Addendum)
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 - Reexploration for bleeding (Right) Subjective: Feels better  Objective: Vital signs in last 24 hours: Temp:  [98 F (36.7 C)-98.4 F (36.9 C)] 98.2 F (36.8 C) (10/07 0448) Pulse Rate:  [50-86] 50 (10/07 0808) Cardiac Rhythm:  [-] Sinus bradycardia (10/07 0808) Resp:  [18-25] 18 (10/07 0448) BP: (96-183)/(25-86) 150/67 mmHg (10/07 0808) SpO2:  [97 %-100 %] 100 % (10/07 0448) Weight:  [216 lb 7.9 oz (98.2 kg)-221 lb 1.9 oz (100.3 kg)] 221 lb 1.9 oz (100.3 kg) (10/07 0426)  Hemodynamic parameters for last 24 hours:    Intake/Output from previous day: 10/06 0701 - 10/07 0700 In: 240 [P.O.:240] Out: 894  Intake/Output this shift: Total I/O In: 60 [P.O.:60] Out: -   General appearance: alert, cooperative and no distress Heart: irregularly irregular rhythm Lungs: min dim in bases Abdomen: benign Extremities: + LE edema Wound: incis healing well  Lab Results:  Recent Labs  04/12/14 0457 04/12/14 1350  WBC 11.3* 11.3*  HGB 9.2* 9.0*  HCT 27.6* 27.1*  PLT 230 258   BMET:  Recent Labs  04/11/14 0335 04/12/14 1350  NA 139 139  K 4.1 3.5*  CL 96 95*  CO2 27 25  GLUCOSE 85 119*  BUN 42* 34*  CREATININE 8.36* 6.96*  CALCIUM 10.2 9.8    PT/INR:  Recent Labs  04/13/14 0332  LABPROT 18.9*  INR 1.58*   ABG    Component Value Date/Time   PHART 7.482* 04/07/2014 0857   HCO3 30.2* 04/07/2014 0857   TCO2 26 04/07/2014 1755   ACIDBASEDEF 1.0 04/06/2014 0524   O2SAT 99.0 04/07/2014 0857   CBG (last 3)   Recent Labs  04/12/14 1808 04/12/14 2026 04/13/14 0636  GLUCAP 96 121* 102*    Meds Scheduled Meds: . amiodarone  400 mg Oral BID  . aspirin EC  81 mg Oral Daily  .  ceFAZolin (ANCEF) IV  2 g Intravenous Q T,Th,Sat-1800  . chlorhexidine  15 mL Mouth/Throat BID    . cinacalcet  60 mg Oral Daily  . darbepoetin (ARANESP) injection - DIALYSIS  100 mcg Intravenous Q Sat-HD  . docusate sodium  200 mg Oral Daily  . feeding supplement (RESOURCE BREEZE)  1 Container Oral TID BM  . guaiFENesin  600 mg Oral BID  . insulin aspart  0-24 Units Subcutaneous TID AC & HS  . lisinopril  20 mg Oral Daily  . pantoprazole  40 mg Oral QAC breakfast  . sevelamer carbonate  1,600 mg Oral TID WC  . warfarin  4 mg Oral q1800  . Warfarin - Physician Dosing Inpatient   Does not apply q1800   Continuous Infusions: . sodium chloride     PRN Meds:.sodium chloride, bisacodyl, bisacodyl, oxyCODONE, traMADol  Xrays Dg Chest 2 View  04/13/2014   CLINICAL DATA:  Shortness of breath chest tube  EXAM: CHEST  2 VIEW  COMPARISON:  Multiple recent previous exams.  FINDINGS: Trace right apical pneumothorax again noted. Scattered areas of subsegmental atelectasis in the right lung are stable persistent right base collapse/ consolidation asymmetric elevation of the right. Left lung is clear. There is pulmonary vascular congestion without overt pulmonary edema. The cardio pericardial silhouette is enlarged. Telemetry leads overlie the  chest.  IMPRESSION: Asymmetric elevation of the right with Stable appearance right atelectasis and basilar collapse/ consolidation with small effusion.   Electronically Signed   By: Misty Stanley M.D.   On: 04/13/2014 07:43   Dg Chest 2 View  04/12/2014   CLINICAL DATA:  Increasing shortness of breath  EXAM: CHEST  2 VIEW  COMPARISON:  April 10, 2014  FINDINGS: Chest tubes remain on the right, not appreciably changed in position. Temporary pacemaker wires are attached to the right heart. There is a minimal right apical pneumothorax. There is subcutaneous air on the right. There is persistent elevation the right hemidiaphragm. There is a right pleural effusion which appears partially loculated, stable. Areas of patchy atelectasis are noted throughout the right  lung. Left lung is clear. Heart is mildly enlarged with pulmonary vascularity within normal limits. No adenopathy.  IMPRESSION: Persistent effusion on the right which appears at least partially loculated. Areas of atelectasis on the right are stable. Chest tubes remain on the right. There is currently a minimal right apical pneumothorax without tension component. Left lung clear.   Electronically Signed   By: Lowella Grip M.D.   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 SBP is quite variable - from 90's to 180's, will increase lisinopril 2 INR slowly increasing, no other new labs, 3 nephrology managing dialysis 4 cont ancef- no fevers 5 push rehab/pulm toilet    LOS: 22 days    GOLD,WAYNE E 04/13/2014  I have seen and examined the patient and agree with the assessment and plan as outlined.  Still in and out of rate-controlled Afib.  Clinically looks good although still w/ some volume excess, slowly trending back to baseline dry weight.  Patient understandably distraught because his brother unexpectedly passed away yesterday.   [name],[name] 04/13/2014 10:09 AM

## 2014-04-14 LAB — GLUCOSE, CAPILLARY
GLUCOSE-CAPILLARY: 92 mg/dL (ref 70–99)
GLUCOSE-CAPILLARY: 161 mg/dL — AB (ref 70–99)
Glucose-Capillary: 86 mg/dL (ref 70–99)

## 2014-04-14 LAB — RENAL FUNCTION PANEL
ALBUMIN: 2.3 g/dL — AB (ref 3.5–5.2)
ANION GAP: 16 — AB (ref 5–15)
BUN: 39 mg/dL — AB (ref 6–23)
CALCIUM: 10 mg/dL (ref 8.4–10.5)
CO2: 24 mEq/L (ref 19–32)
Chloride: 96 mEq/L (ref 96–112)
Creatinine, Ser: 8.16 mg/dL — ABNORMAL HIGH (ref 0.50–1.35)
GFR calc Af Amer: 8 mL/min — ABNORMAL LOW (ref >90)
GFR calc non Af Amer: 7 mL/min — ABNORMAL LOW (ref >90)
GLUCOSE: 99 mg/dL (ref 70–99)
POTASSIUM: 3.9 meq/L (ref 3.7–5.3)
Phosphorus: 5.6 mg/dL — ABNORMAL HIGH (ref 2.3–4.6)
SODIUM: 136 meq/L — AB (ref 137–147)

## 2014-04-14 LAB — CBC
HEMATOCRIT: 25.2 % — AB (ref 39.0–52.0)
Hemoglobin: 8.4 g/dL — ABNORMAL LOW (ref 13.0–17.0)
MCH: 29.6 pg (ref 26.0–34.0)
MCHC: 33.3 g/dL (ref 30.0–36.0)
MCV: 88.7 fL (ref 78.0–100.0)
Platelets: 331 10*3/uL (ref 150–400)
RBC: 2.84 MIL/uL — ABNORMAL LOW (ref 4.22–5.81)
RDW: 15.6 % — ABNORMAL HIGH (ref 11.5–15.5)
WBC: 9.7 10*3/uL (ref 4.0–10.5)

## 2014-04-14 LAB — PROTIME-INR
INR: 1.82 — ABNORMAL HIGH (ref 0.00–1.49)
Prothrombin Time: 21.1 seconds — ABNORMAL HIGH (ref 11.6–15.2)

## 2014-04-14 MED ORDER — LISINOPRIL 20 MG PO TABS
20.0000 mg | ORAL_TABLET | Freq: Every evening | ORAL | Status: DC
  Filled 2014-04-14: qty 1

## 2014-04-14 MED ORDER — AMIODARONE HCL 200 MG PO TABS
200.0000 mg | ORAL_TABLET | Freq: Two times a day (BID) | ORAL | Status: DC
  Administered 2014-04-14 – 2014-04-15 (×3): 200 mg via ORAL
  Filled 2014-04-14 (×4): qty 1

## 2014-04-14 NOTE — Progress Notes (Signed)
      [address]       [place] [address]             [phone]     CARDIOTHORACIC SURGERY PROGRESS NOTE  9 Days Post-Op Procedure(s) (LRB):  MINIMALLY INVASIVE MITRAL VALVE REPAIR (MVR) (Right)  INTRAOPERATIVE TRANSESOPHAGEAL ECHOCARDIOGRAM (N/A)   9 Days Post-Op  S/P Procedure(s) (LRB): Bring back MINIMALLY INVASIVE MITRAL VALVE (MV) REPLACEMENT - Reexploration for bleeding (Right)  Subjective: Feels better but still some soreness in chest, particularly w/ coughing.  Cough and breathing reportedly improved.   Objective: Vital signs in last 24 hours: Temp:  [97.8 F (36.6 C)-98.8 F (37.1 C)] 97.8 F (36.6 C) (10/08 0420) Pulse Rate:  [50-57] 57 (10/08 0420) Cardiac Rhythm:  [-] Normal sinus rhythm;Sinus bradycardia;Atrial fibrillation;Atrial flutter (10/07 1938) Resp:  [19-21] 21 (10/08 0420) BP: (102-107)/(53) 107/53 mmHg (10/08 0420) SpO2:  [98 %-100 %] 100 % (10/08 0420) Weight:  [101.515 kg (223 lb 12.8 oz)] 101.515 kg (223 lb 12.8 oz) (10/08 0420)  Physical Exam:  Rhythm:   sinus  Breath sounds: Slightly diminished on right base, otherwise fairly clear  Heart sounds:  RRR w/out murmur  Incisions:  Clean and dry  Abdomen:  Soft, non-distended, non-tender  Extremities:  Warm, well-perfused    Intake/Output from previous day: 10/07 0701 - 10/08 0700 In: 180 [P.O.:180] Out: -  Intake/Output this shift:    Lab Results:  Recent Labs  04/12/14 0457 04/12/14 1350  WBC 11.3* 11.3*  HGB 9.2* 9.0*  HCT 27.6* 27.1*  PLT 230 258   BMET:  Recent Labs  04/12/14 1350  NA 139  K 3.5*  CL 95*  CO2 25  GLUCOSE 119*  BUN 34*  CREATININE 6.96*  CALCIUM 9.8    CBG (last 3)   Recent Labs  04/13/14 1613 04/13/14 2105 04/14/14 0627  GLUCAP 130* 96 86   PT/INR:   Recent Labs  04/14/14 0415  LABPROT 21.1*  INR 1.82*    CXR:  CHEST 2 VIEW  COMPARISON: Multiple recent previous exams.  FINDINGS:  Trace right apical pneumothorax  again noted. Scattered areas of  subsegmental atelectasis in the right lung are stable persistent  right base collapse/ consolidation asymmetric elevation of the  right. Left lung is clear. There is pulmonary vascular congestion  without overt pulmonary edema. The cardio pericardial silhouette is  enlarged. Telemetry leads overlie the chest.  IMPRESSION:  Asymmetric elevation of the right with Stable appearance right  atelectasis and basilar collapse/ consolidation with small effusion.  Electronically Signed  By: Misty Stanley M.D.  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

## 2014-04-14 NOTE — Progress Notes (Signed)
Subjective:  UF 900 cc yest with HD, today stil coughing up clear phlegm, CXR shows persistent elevation R hemidiaphragm, no CHF  Objective: Vital signs in last 24 hours: Temp:  [97.8 F (36.6 C)-98.8 F (37.1 C)] 97.8 F (36.6 C) (10/08 0420) Pulse Rate:  [50-57] 57 (10/08 0420) Resp:  [19-21] 21 (10/08 0420) BP: (102-107)/(53) 107/53 mmHg (10/08 0420) SpO2:  [98 %-100 %] 100 % (10/08 0420) Weight:  [101.515 kg (223 lb 12.8 oz)] 101.515 kg (223 lb 12.8 oz) (10/08 0420) Weight change: 3.315 kg (7 lb 4.9 oz)  Intake/Output from previous day: 10/07 0701 - 10/08 0700 In: 180 [P.O.:180] Out: -  Intake/Output this shift: Total I/O In: 240 [P.O.:240] Out: -   Lab Results:  Recent Labs  04/12/14 0457 04/12/14 1350  WBC 11.3* 11.3*  HGB 9.2* 9.0*  HCT 27.6* 27.1*  PLT 230 258   BMET:   Recent Labs  04/12/14 1350  NA 139  K 3.5*  CL 95*  CO2 25  GLUCOSE 119*  BUN 34*  CREATININE 6.96*  CALCIUM 9.8  ALBUMIN 2.2*   No results found for this basename: PTH,  in the last 72 hours Iron Studies: No results found for this basename: IRON, TIBC, TRANSFERRIN, FERRITIN,  in the last 72 hours  Studies/Results: Dg Chest 2 View  04/13/2014   CLINICAL DATA:  Shortness of breath chest tube  EXAM: CHEST  2 VIEW  COMPARISON:  Multiple recent previous exams.  FINDINGS: Trace right apical pneumothorax again noted. Scattered areas of subsegmental atelectasis in the right lung are stable persistent right base collapse/ consolidation asymmetric elevation of the right. Left lung is clear. There is pulmonary vascular congestion without overt pulmonary edema. The cardio pericardial silhouette is enlarged. Telemetry leads overlie the chest.  IMPRESSION: Asymmetric elevation of the right with Stable appearance right atelectasis and basilar collapse/ consolidation with small effusion.   Electronically Signed   By: Misty Stanley M.D.   On: 04/13/2014 07:43    EXAM: General appearance:  Alert, in  no apparent distress Resp: Diminished breath sounds @ R base; otherwise, clear Cardio: RRR without murmur or rub GI: + BS, soft and nontender Extremities: 1+ bilat pretib LE edema Access:  AVF @ LFA with + bruit  HD: [facility]  4h 103kgs 2K/2Ca [place] Heparin 9000 > 1500 mid 500/1.5  LFA AVF Hectorol 9 ug TIW, Aranesp 25 mcg weekly  Assessment: 1. 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 Stable now on Ancef x 6 wks (through 10/20). 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 Donnetta Hutching.  3. ESRD on HD 4. HTN/Volume - on lisinopril 40/d, has some extra vol still and BP low today > reduce lisinopril to 20 mg/day, hold SBP less than 120.  5. Anemia - Hgb 9.2 (s/p 2 U PRBCs 9/19), Aranesp 100 mcg on Sat. 6. 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  7. Nutrition - Alb 2.1, renal diet, multivitamin, supplement.   Plan - HD today, get volume down, dec BP meds.

## 2014-04-14 NOTE — Progress Notes (Signed)
CARDIAC REHAB PHASE I   PRE:  Rate/Rhythm: 61 SR    BP: sitting 133/67    SaO2: 97 RA  MODE:  Ambulation: 890 ft   POST:  Rate/Rhythm: 70 SR    BP: sitting 130/69     SaO2: 97 RA  Tolerated very well. Steady without RW. No c/o except left foot numbness (leg swollen). Will ed tomorrow. [id number]   Timothy Palmer Allison CES, ACSM 04/14/2014 11:17 AM

## 2014-04-14 NOTE — Discharge Summary (Signed)
[address]       [place] [address]             [phone]              Discharge Summary  Name: Timothy Palmer DOB: 09/23/1965 48 y.o. 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 Hypertension   . Peripheral vascular disease   . Pneumonia 2014  . ESRD (end stage renal disease) on dialysis     [place],Dialysis- T,Th,S  . Acute on chronic diastolic heart failure [id number]  . Severe mitral regurgitation 03/23/2014    by TEE  . Positive blood culture 11/27/2013    MSSA  . Positive blood culture 02/24/2014    MSSA  . DVT of upper extremity (deep vein thrombosis) 03/04/2014    Right arm  . Knee pain, left 03/21/2014  . Acute combined systolic and diastolic congestive heart failure 03/21/2014  . Chronic diastolic congestive heart failure   . 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 Septic embolism to left lower extremity 03/25/2014  . 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 The aneurysm measures 1.4 x 1.1 cm which is  significantly larger than the 4.5 mm parent vessel.   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 S/P minimally invasive mitral valve repair 04/05/2014   Complex valvuloplasty including autologous pericardial patch repair of large perforation of posterior leaflet due to endocarditis with 28 mm Sorin Memo 3D ring annuloplasty via right mini thoracotomy approach     Procedures: LEFT POPLITEAL EMBOLECTOMY AND INTRAOPERATIVE ARTERIOGRAM  - 03/25/2014  ABOVE KNEE TO BELOW KNEE POPLITEAL BYPASS WITH REVERSED GREATER SAPHENOUS VEIN - 03/25/2014  MINIMALLY INVASIVE MITRAL VALVE REPAIR - 04/05/2014  Complex valvuloplasty including autologous pericardial patch repair of perforation of posterior leaflet   Sorin Memo 3D Ring Annuloplasty (size 39mm)  REEXPLORATION OF CHEST FOR BLEEDING  - 04/06/2014    HPI:  The patient is a 48 y.o. male with history of end-stage renal disease for which he has been dialysis dependent for the past 11 years. The patient originally developed renal failure related to long-standing hypertension. He dialyzes 3 times a week at the [facility] dialysis center on a Tuesday/Thursday/Saturday schedule via left forearm primary AV fistula. Over the years, the patient has done fairly well on dialysis therapy. He did undergo revision of his left radiocephalic AV fistula using bovine pericardial patch angioplasty on 07/23/2013 because of proximal radial artery occlusion. This past May, he developed transient methicillin sensitive Staphylococcus aureus sepsis that was treated with intravenous cefazolin during dialysis. Followup blood cultures after completion of therapy reportedly were negative. The patient developed recurrent positive blood cultures obtained during dialysis therapy on 02/24/2014 which again grew MSSA. He was started on intravenous cefazolin therapy with dialysis which he had been receiving up until the time of this hospital admission. In addition, the patient was seen  in the emergency department 03/04/2014 for right arm pain and was found to have a DVT in the right upper arm. Anticoagulation therapy was not recommended and no  further workup was performed.   Over the past several weeks, the patient has developed progressive exertional shortness of breath and dry nonproductive cough. Over the 2 weeks prior to admission, he experienced intermittent resting shortness of breath and orthopnea. He also began to experience intermittent fevers and chills. On 03/21/2014, the patient developed sudden onset pain in his left knee. He states that he felt his knee pop and ever since that time he has had significant pain with difficulty bearing weight on his left leg. He was seen in the ER and noted to be febrile with mild leukocytosis.  He was admitted to the hospital for further evaluation and treatment.     Hospital Course:  The patient was admitted to [facility] on 03/22/2014. He was treated empirically for possible HCAP with intravenous vancomycin and cefepime. He was noted to have a murmur on physical exam, and transthoracic echocardiogram was performed which demonstrated moderate to severe mitral regurgitation with possible vegetation on the mitral valve. Transesophageal echocardiogram was performed by Dr. Aundra Dubin and confirmed the presence of what appears to be a small vegetation adherent to the posterior leaflet of the mitral valve and severe (4+) mitral regurgitation. Infectious disease was consulted and recommended continued antibiotic therapy. Cardiothoracic surgical consultation was requested and Dr.[name] saw the patient. It was felt that the patient would ultimately require mitral valve intervention, but it was felt that further workup for septic emboli should be completed prior to surgery.  An orthopedics consult was obtained for evaluation of possible septic left knee.  Dr. Berenice Primas saw the patient and plain films of the knee revealed no evidence of infection in the joint.   A CT angiogram was performed which showed a probable mycotic aneurysm to a branch vessel of the mesenteric artery as well as an embolus in the popliteal artery  causing complete occlusion and acute ischemia. Vascular surgery was consulted and recommended urgent peripheral revascularization. He was taken to the operating room and underwent a left popliteal embolectomy by Dr. Donnetta Hutching. He initially had good Doppler flow in the operating room, but once reaching recovery, he developed acute right foot pain and loss of Doppler signals. He ws returned to the OR for exploration, and underwent an above knee to below knee popliteal bypass. He tolerated this well with return of palpable dorsalis pedis pulse. It was felt that he would not require any further treatment for the mycotic aneurysm at this time, but outpatient follow up with CT scan will be arranged by [facility].   Cardiac catheterization was performed on 9/21 by Dr. Aundra Dubin and revealed no significant CAD. The patient was evaluated by the dental service. He was noted to have chronic periodontitis as well as many missing teeth, but the patient wished to defer any dental treatment at this time. The patient otherwise remained stable and was felt to be stable to proceed with valve surgery.  All risks, benefits and alternatives of surgery were explained in detail, and the patient agreed to proceed. The patient was taken to the operating room and underwent the above procedure.    He tolerated the procedure well and was transferred to the ICU for further management. Patient's early postoperative recovery was entirely uneventful with stable hemodynamics and minimal chest tube output. He was extubated without difficulty, but shortly after extubation he began  coughing forcefully, and subsequently developed sudden onset dramatically increased chest tube output, in excess of 1000 ml. Initially his BP dropped but it rebounded with volume administration. He was returned to the operating room for exploration.  Intraoperatively, there was bleeding noted from the site of the right atrial retrograde cannula, and this was  controlled easily. He did have some atrial fibrillation intraoperatively and was started on IV Amiodarone. He was returned to the ICU postoperatively in stable condition.  The patient remained stable on the vent until postop day 2, at which time he was extubated without difficulty.  He remained on multiple drips, but was hemodynamically stable.  All drips were ultimately weaned and discontinued. Amiodarone was switched from IV to po. He made slow but steady progress and was transferred to the stepdown floor on postop day 5.   He developed some junctional bradycardia requiring AV pacing, then had recurrent afib with controlled rate.  He was treated with bolus Amiodarone and his po dose was increased.  Blood pressures started to trend up and he was restarted on Lisinopril with improved control. Chest tubes were removed in the standard fashion and follow up chest x-rays have remained stable. Coumadin was started and INR is trending up appropriately.  He has continued to have intermitted afib, but rates have been controlled. The patient has continued on his regular dialysis schedule per nephrology. IV Cefepime has been continued for MSSA endocarditis per ID recommendations, and will continue for 6 weeks postop.  To date, intraoperative cultures have been negative. Incisions are healing well. He is ambulating in the halls without difficulty and has been weaned from supplemental oxygen.  He has been evaluated on today's date and is presently medically stable for discharge home.     Recent vital signs:  Filed Vitals:   04/15/14 0441  BP: 135/89  Pulse: 63  Temp: 98.2 F (36.8 C)  Resp: 19    Recent laboratory studies:  CBC:  Recent Labs  04/12/14 1350 04/14/14 1216  WBC 11.3* 9.7  HGB 9.0* 8.4*  HCT 27.1* 25.2*  PLT 258 331   BMET:   Recent Labs  04/12/14 1350 04/14/14 1216  NA 139 136*  K 3.5* 3.9  CL 95* 96  CO2 25 24  GLUCOSE 119* 99  BUN 34* 39*  CREATININE 6.96* 8.16*    CALCIUM 9.8 10.0    PT/INR:   Recent Labs  04/15/14 0419  LABPROT 21.9*  INR 1.91*     Discharge Medications:     Medication List    STOP taking these medications       oxyCODONE-acetaminophen 5-325 MG per tablet  Commonly known as:  PERCOCET/ROXICET      TAKE these medications       amiodarone 200 MG tablet  Commonly known as:  PACERONE  Take 1 tablet (200 mg total) by mouth 2 (two) times daily.     aspirin 81 MG EC tablet  Take 1 tablet (81 mg total) by mouth daily.     ceFAZolin 2-3 GM-% Solr  Commonly known as:  ANCEF  2 g IV every Tuesday-Thursday-Saturday with dialysis, last dose 05/17/2014     cinacalcet 60 MG tablet  Commonly known as:  SENSIPAR  Take 60 mg by mouth daily.     darbepoetin 100 MCG/0.5ML Soln injection  Commonly known as:  ARANESP  Inject 0.5 mLs (100 mcg total) into the vein every Saturday with hemodialysis.     guaiFENesin 600 MG 12 hr tablet  Commonly known as:  MUCINEX  Take 1 tablet (600 mg total) by mouth 2 (two) times daily.     lisinopril 20 MG tablet  Commonly known as:  PRINIVIL,ZESTRIL  Take 20 mg by mouth daily.     oxyCODONE 5 MG immediate release tablet  Commonly known as:  Oxy IR/ROXICODONE  Take 1-2 tablets (5-10 mg total) by mouth every 3 (three) hours as needed for severe pain.     sevelamer carbonate 800 MG tablet  Commonly known as:  RENVELA  Take 2 tablets (1,600 mg total) by mouth 3 (three) times daily with meals.     warfarin 5 MG tablet  Commonly known as:  COUMADIN  5 mg po daily or as directed         Discharge Instructions:  The patient is to refrain from driving, heavy lifting or strenuous activity.  May shower daily and clean incisions with soap and water.  May resume regular diet.   Follow Up:    Follow-up Information   Follow up with GRAVES,JOHN L, MD. Schedule an appointment as soon as possible for a visit in 2 weeks.   Specialty:  Orthopedic Surgery   Contact information:   [place] [address] [phone]       Follow up with Rexene Alberts, MD On 04/25/2014. (Have a chest x-ray at [place] at 12:00, then see MD at 1:00)    Specialty:  Cardiothoracic Surgery   Contact information:   [place] [address] [phone]       Follow up with Loralie Champagne, MD On 04/28/2014. (Heart Failure clinic 10/22 @ 11:15am )    Specialty:  Cardiology   Contact information:   [id number] [place] [address] [phone]       Follow up with EARLY, TODD, MD In 2 weeks. (Office will call you to arrange your appt (sent))    Specialty:  Vascular Surgery   Contact information:   [address] [place] [address] [phone]       Please follow up. (Please have bloodwork for Coumadin (PT/INR) checked at dialysis )      The patient has been discharged on:  1.Beta Blocker: Yes [  ]  No [ x ]  If No, reason: Bradycardia   2.Ace Inhibitor/ARB: Yes [ x ]  No [  ]  If No, reason:    3.Statin: Yes [  ]  No [ x ]  If No, reason: No CAD   4.S[place]a [place]: Yes [ x ]  No [ ]   If No, reason:    Dakarri Kessinger H 04/15/2014, 8:55 AM

## 2014-04-15 ENCOUNTER — Telehealth: Payer: Self-pay | Admitting: Vascular Surgery

## 2014-04-15 LAB — GLUCOSE, CAPILLARY
GLUCOSE-CAPILLARY: 94 mg/dL (ref 70–99)
Glucose-Capillary: 96 mg/dL (ref 70–99)

## 2014-04-15 LAB — PROTIME-INR
INR: 1.91 — ABNORMAL HIGH (ref 0.00–1.49)
Prothrombin Time: 21.9 seconds — ABNORMAL HIGH (ref 11.6–15.2)

## 2014-04-15 MED ORDER — GUAIFENESIN ER 600 MG PO TB12: 600.0000 mg | ORAL_TABLET | Freq: Two times a day (BID) | ORAL | Status: DC

## 2014-04-15 MED ORDER — OXYCODONE HCL 5 MG PO TABS: 5.0000 mg | ORAL_TABLET | ORAL | Status: DC | PRN

## 2014-04-15 MED ORDER — CEFAZOLIN SODIUM-DEXTROSE 2-3 GM-% IV SOLR: INTRAVENOUS | Status: DC

## 2014-04-15 MED ORDER — SEVELAMER CARBONATE 800 MG PO TABS: 1600.0000 mg | ORAL_TABLET | Freq: Three times a day (TID) | ORAL | Status: DC

## 2014-04-15 MED ORDER — DARBEPOETIN ALFA-POLYSORBATE 100 MCG/0.5ML IJ SOLN: 100.0000 ug | INTRAMUSCULAR | Status: DC

## 2014-04-15 MED ORDER — WARFARIN SODIUM 5 MG PO TABS: ORAL_TABLET | ORAL | Status: DC

## 2014-04-15 MED ORDER — ASPIRIN 81 MG PO TBEC: 81.0000 mg | DELAYED_RELEASE_TABLET | Freq: Every day | ORAL | Status: DC

## 2014-04-15 MED ORDER — AMIODARONE HCL 200 MG PO TABS: 200.0000 mg | ORAL_TABLET | Freq: Two times a day (BID) | ORAL | Status: DC

## 2014-04-15 NOTE — Progress Notes (Signed)
0950-1020 Pt for discharge if ok with renal. Reviewed IS, wound care and exercise ed. Referring diet to dietitian at HD who pt has been seeing. Discussed CRP 2 with pt which pt declines at this time due to dialysis T-TH-SAT and program meets MWF which pt felt would be too much at this time. Stated will walk for ex. Has walker at home if needed. Put on discharge video for pt to view. Offered to walk with pt but he wants to save energy for discharge. Knows to walk if not discharged. Graylon Good RN BSN 04/15/2014 10:25 AM

## 2014-04-15 NOTE — Progress Notes (Signed)
       [address]       [place] [address]             [phone]          10 Days Post-Op Procedure(s) (LRB): Bring back MINIMALLY INVASIVE MITRAL VALVE (MV) REPLACEMENT - Reexploration for bleeding (Right)  Subjective: Feels well, no complaints.   Objective: Vital signs in last 24 hours: Patient Vitals for the past 24 hrs:  BP Temp Temp src Pulse Resp SpO2 Weight  04/15/14 0441 135/89 mmHg 98.2 F (36.8 C) Oral 63 19 100 % 217 lb 1.6 oz (98.476 kg)  04/14/14 2020 96/40 mmHg 98.2 F (36.8 C) Oral 66 21 98 % -  04/14/14 1630 136/72 mmHg 98 F (36.7 C) Oral 70 20 100 % 212 lb 11.9 oz (96.5 kg)  04/14/14 1530 133/82 mmHg - - 60 - - -  04/14/14 1500 158/68 mmHg - - 65 - - -  04/14/14 1427 135/58 mmHg - - 61 - - -  04/14/14 1400 158/85 mmHg - - 60 - - -  04/14/14 1300 170/80 mmHg - - 59 30 - -  04/14/14 1213 175/73 mmHg 97.9 F (36.6 C) Oral 60 17 100 % 227 lb 1.2 oz (103 kg)   Current Weight  04/15/14 217 lb 1.6 oz (98.476 kg)     Intake/Output from previous day: 10/08 0701 - 10/09 0700 In: 480 [P.O.:480] Out: 3233     PHYSICAL EXAM:  Heart: RRR Lungs: Clear Wound: Clean and dry Extremities: Mild LE edema    Lab Results: CBC: Recent Labs  04/12/14 1350 04/14/14 1216  WBC 11.3* 9.7  HGB 9.0* 8.4*  HCT 27.1* 25.2*  PLT 258 331   BMET:  Recent Labs  04/12/14 1350 04/14/14 1216  NA 139 136*  K 3.5* 3.9  CL 95* 96  CO2 25 24  GLUCOSE 119* 99  BUN 34* 39*  CREATININE 6.96* 8.16*  CALCIUM 9.8 10.0    PT/INR:  Recent Labs  04/15/14 0419  LABPROT 21.9*  INR 1.91*      Assessment/Plan: S/P Procedure(s) (LRB): Bring back MINIMALLY INVASIVE MITRAL VALVE (MV) REPLACEMENT - Reexploration for bleeding (Right)  CV- Maintaining SR with intermittent rate controlled AF.  Continue Amio, Coumadin. BPs generally controlled on Lisinopril.  ESRD- HD yesterday.  Per renal.  MSSA endocarditis- Continue Cefazolin x 6 weeks  postop.  Overall stable from cardiac surgery standpoint.  Plan d/c home today if ok with renal.  Will resume home HD schedule T-Th-Sa.   LOS: 24 days    Sirenity Shew H 04/15/2014

## 2014-04-15 NOTE — Progress Notes (Signed)
Subjective:  3200 off w HD yesterday, stable BP  Objective: Vital signs in last 24 hours: Temp:  [98 F (36.7 C)-98.4 F (36.9 C)] 98.4 F (36.9 C) (10/09 1321) Pulse Rate:  [60-71] 71 (10/09 1321) Resp:  [18-21] 18 (10/09 1321) BP: (96-158)/(40-89) 128/81 mmHg (10/09 1321) SpO2:  [96 %-100 %] 96 % (10/09 1321) Weight:  [96.5 kg (212 lb 11.9 oz)-98.476 kg (217 lb 1.6 oz)] 98.476 kg (217 lb 1.6 oz) (10/09 0441) Weight change: 1.485 kg (3 lb 4.4 oz)  Intake/Output from previous day: 10/08 0701 - 10/09 0700 In: 480 [P.O.:480] Out: 3233  Intake/Output this shift:    Lab Results:  Recent Labs  04/14/14 1216  WBC 9.7  HGB 8.4*  HCT 25.2*  PLT 331   BMET:   Recent Labs  04/14/14 1216  NA 136*  K 3.9  CL 96  CO2 24  GLUCOSE 99  BUN 39*  CREATININE 8.16*  CALCIUM 10.0  ALBUMIN 2.3*   No results found for this basename: PTH,  in the last 72 hours Iron Studies: No results found for this basename: IRON, TIBC, TRANSFERRIN, FERRITIN,  in the last 72 hours  Studies/Results: No results found.  EXAM: General appearance:  Alert, in no apparent distress Resp: Diminished breath sounds @ R base; otherwise, clear Cardio: RRR without murmur or rub GI: + BS, soft and nontender Extremities: 1+ bilat pretib LE edema Access:  AVF @ LFA with + bruit  [facility]  4h 103kgs 2K/2Ca [place] Heparin 9000 > 1500 mid 500/1.5  LFA AVF Hectorol 9 ug TIW, Aranesp 25 mcg weekly  Assessment: 1. 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 Stable now on Ancef x 6 wks (through 10/20). 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 Donnetta Hutching.  3. ESRD on HD 4. HTN/Volume - cont lisinopril 20 mg/day, new dry wt 97 kg 5. Anemia - Hgb 9.2 (s/p 2 U PRBCs 9/19),  Aranesp 100 mcg on Sat. 6. 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  7. Nutrition - Alb 2.1, renal diet, multivitamin, supplement.   Plan - OK for d/c from renal standpoint

## 2014-04-15 NOTE — Progress Notes (Signed)
Pt discharging home with family.  All instructions and prescriptions given and reviewed, all questions answered.  CT sutures remain (x's 2) on right flank, per PA will be removed at follow up office visit.

## 2014-04-15 NOTE — Telephone Encounter (Addendum)
Message copied by Gena Fray on Fri Apr 15, 2014 10:52 AM ------      Message from: Denman George      Created: Thu Apr 14, 2014  3:49 PM      Regarding: Zigmund Daniel log; Also needs 2 wk f/u with TFE                   ----- Message -----         From: Gabriel Earing, PA-C         Sent: 04/14/2014   3:47 PM           To: Vvs Charge Pool            S/p Above-knee to below-knee popliteal bypass with reversed great saphenous vein 03/25/14.  F/u  With Dr. Donnetta Hutching in 2 weeks.            Thanks,      Samantha ------  04/15/14: left msg for pt, dpm

## 2014-04-16 DIAGNOSIS — D509 Iron deficiency anemia, unspecified: Secondary | ICD-10-CM | POA: Diagnosis not present

## 2014-04-16 DIAGNOSIS — N186 End stage renal disease: Secondary | ICD-10-CM | POA: Diagnosis not present

## 2014-04-16 DIAGNOSIS — N2581 Secondary hyperparathyroidism of renal origin: Secondary | ICD-10-CM | POA: Diagnosis not present

## 2014-04-16 DIAGNOSIS — R7881 Bacteremia: Secondary | ICD-10-CM | POA: Diagnosis not present

## 2014-04-16 DIAGNOSIS — D631 Anemia in chronic kidney disease: Secondary | ICD-10-CM | POA: Diagnosis not present

## 2014-04-19 DIAGNOSIS — N186 End stage renal disease: Secondary | ICD-10-CM | POA: Diagnosis not present

## 2014-04-19 DIAGNOSIS — R7881 Bacteremia: Secondary | ICD-10-CM | POA: Diagnosis not present

## 2014-04-19 DIAGNOSIS — D509 Iron deficiency anemia, unspecified: Secondary | ICD-10-CM | POA: Diagnosis not present

## 2014-04-19 DIAGNOSIS — D631 Anemia in chronic kidney disease: Secondary | ICD-10-CM | POA: Diagnosis not present

## 2014-04-19 DIAGNOSIS — N2581 Secondary hyperparathyroidism of renal origin: Secondary | ICD-10-CM | POA: Diagnosis not present

## 2014-04-20 ENCOUNTER — Other Ambulatory Visit: Payer: Self-pay | Admitting: Thoracic Surgery (Cardiothoracic Vascular Surgery)

## 2014-04-20 DIAGNOSIS — Z9889 Other specified postprocedural states: Secondary | ICD-10-CM

## 2014-04-21 DIAGNOSIS — N186 End stage renal disease: Secondary | ICD-10-CM | POA: Diagnosis not present

## 2014-04-21 DIAGNOSIS — R7881 Bacteremia: Secondary | ICD-10-CM | POA: Diagnosis not present

## 2014-04-21 DIAGNOSIS — D509 Iron deficiency anemia, unspecified: Secondary | ICD-10-CM | POA: Diagnosis not present

## 2014-04-21 DIAGNOSIS — D631 Anemia in chronic kidney disease: Secondary | ICD-10-CM | POA: Diagnosis not present

## 2014-04-21 DIAGNOSIS — N2581 Secondary hyperparathyroidism of renal origin: Secondary | ICD-10-CM | POA: Diagnosis not present

## 2014-04-23 DIAGNOSIS — D631 Anemia in chronic kidney disease: Secondary | ICD-10-CM | POA: Diagnosis not present

## 2014-04-23 DIAGNOSIS — N186 End stage renal disease: Secondary | ICD-10-CM | POA: Diagnosis not present

## 2014-04-23 DIAGNOSIS — N2581 Secondary hyperparathyroidism of renal origin: Secondary | ICD-10-CM | POA: Diagnosis not present

## 2014-04-23 DIAGNOSIS — R7881 Bacteremia: Secondary | ICD-10-CM | POA: Diagnosis not present

## 2014-04-23 DIAGNOSIS — D509 Iron deficiency anemia, unspecified: Secondary | ICD-10-CM | POA: Diagnosis not present

## 2014-04-25 ENCOUNTER — Ambulatory Visit: Payer: Medicare Other | Admitting: Thoracic Surgery (Cardiothoracic Vascular Surgery)

## 2014-04-26 ENCOUNTER — Other Ambulatory Visit: Payer: Self-pay | Admitting: Thoracic Surgery (Cardiothoracic Vascular Surgery)

## 2014-04-26 DIAGNOSIS — R7881 Bacteremia: Secondary | ICD-10-CM | POA: Diagnosis not present

## 2014-04-26 DIAGNOSIS — D631 Anemia in chronic kidney disease: Secondary | ICD-10-CM | POA: Diagnosis not present

## 2014-04-26 DIAGNOSIS — N2581 Secondary hyperparathyroidism of renal origin: Secondary | ICD-10-CM | POA: Diagnosis not present

## 2014-04-26 DIAGNOSIS — D509 Iron deficiency anemia, unspecified: Secondary | ICD-10-CM | POA: Diagnosis not present

## 2014-04-26 DIAGNOSIS — I729 Aneurysm of unspecified site: Secondary | ICD-10-CM

## 2014-04-26 DIAGNOSIS — N186 End stage renal disease: Secondary | ICD-10-CM | POA: Diagnosis not present

## 2014-04-28 ENCOUNTER — Ambulatory Visit (INDEPENDENT_AMBULATORY_CARE_PROVIDER_SITE_OTHER): Payer: Self-pay | Admitting: Thoracic Surgery (Cardiothoracic Vascular Surgery)

## 2014-04-28 ENCOUNTER — Other Ambulatory Visit: Payer: Self-pay | Admitting: Thoracic Surgery (Cardiothoracic Vascular Surgery)

## 2014-04-28 ENCOUNTER — Encounter: Payer: Self-pay | Admitting: Thoracic Surgery (Cardiothoracic Vascular Surgery)

## 2014-04-28 ENCOUNTER — Ambulatory Visit
Admission: RE | Admit: 2014-04-28 | Discharge: 2014-04-28 | Disposition: A | Discharge status: Home / Self Care | Payer: Medicare Other | Source: Ambulatory Visit | Attending: Thoracic Surgery (Cardiothoracic Vascular Surgery) | Admitting: Thoracic Surgery (Cardiothoracic Vascular Surgery)

## 2014-04-28 ENCOUNTER — Other Ambulatory Visit: Payer: Self-pay | Admitting: *Deleted

## 2014-04-28 ENCOUNTER — Encounter (HOSPITAL_COMMUNITY): Payer: Medicare Other

## 2014-04-28 VITALS — BP 119/74 | HR 93 | Ht 71.0 in | Wt 217.0 lb

## 2014-04-28 DIAGNOSIS — R05 Cough: Secondary | ICD-10-CM | POA: Diagnosis not present

## 2014-04-28 DIAGNOSIS — I330 Acute and subacute infective endocarditis: Secondary | ICD-10-CM

## 2014-04-28 DIAGNOSIS — Z952 Presence of prosthetic heart valve: Secondary | ICD-10-CM | POA: Diagnosis not present

## 2014-04-28 DIAGNOSIS — Z9889 Other specified postprocedural states: Secondary | ICD-10-CM

## 2014-04-28 DIAGNOSIS — N186 End stage renal disease: Secondary | ICD-10-CM | POA: Diagnosis not present

## 2014-04-28 DIAGNOSIS — R7881 Bacteremia: Secondary | ICD-10-CM | POA: Diagnosis not present

## 2014-04-28 DIAGNOSIS — R791 Abnormal coagulation profile: Secondary | ICD-10-CM | POA: Diagnosis not present

## 2014-04-28 DIAGNOSIS — D631 Anemia in chronic kidney disease: Secondary | ICD-10-CM | POA: Diagnosis not present

## 2014-04-28 DIAGNOSIS — D509 Iron deficiency anemia, unspecified: Secondary | ICD-10-CM | POA: Diagnosis not present

## 2014-04-28 DIAGNOSIS — R0602 Shortness of breath: Secondary | ICD-10-CM | POA: Diagnosis not present

## 2014-04-28 DIAGNOSIS — N2581 Secondary hyperparathyroidism of renal origin: Secondary | ICD-10-CM | POA: Diagnosis not present

## 2014-04-28 MED ORDER — AMIODARONE HCL 200 MG PO TABS: 200.0000 mg | ORAL_TABLET | Freq: Every day | ORAL | Status: DC

## 2014-04-28 NOTE — Patient Instructions (Signed)
Decrease your dose of amiodarone to 200 mg daily (1 tablet)  The patient may continue to gradually increase their physical activity as tolerated.  They should refrain from any heavy lifting or strenuous use of their arms and shoulders until at least 8 weeks from the time of their surgery, and they should avoid activities that cause increased pain in their chest on the side of their surgical incision.  Otherwise they may continue to increase their activities without any particular limitations.

## 2014-04-28 NOTE — Progress Notes (Addendum)
[address]       [place] [address]             [phone]     CARDIOTHORACIC SURGERY OFFICE NOTE  Referring Provider is [facility], Elby Showers, MD PCP is Louis Meckel, MD   HPI:  Patient is [name] who returns to the office today for routine followup status post minimally invasive mitral valve repair on 04/05/2014 for MSSA bacterial endocarditis with severe mitral regurgitation dictated by acute combined systolic and diastolic congestive heart failure with septic embolization to the left lower extremity, spleen, and small intestine.  Early postoperatively he had recurrent paroxysmal atrial fibrillation for which the patient was treated with amiodarone.  He was discharged from the hospital in sinus rhythm on both low-dose aspirin and warfarin for anticoagulation. His last prothrombin time INR was 1.9 prior to hospital discharge. Since hospital discharge she has been receiving intravenous Ancef 3 times a week after dialysis treatments. He missed his first postop visit in our office because of difficulty getting transportation. He has not yet been seen in followup by cardiology or infectious disease. The patient has not been to the Coumadin clinic and he is uncertain whether or not his prothrombin time has been checked at dialysis. Clinically the patient reports that he is doing well. He has very mild residual soreness in the chest and he has stopped taking oral narcotic pain relievers. He states that his breathing and exercise tolerance continues to improve. He denies any fevers or chills. Appetite is good.  He states that his left leg still remains swollen but it does not hurt any longer.  He is walking without difficulty.  Overall he has no complaints.   Current Outpatient Prescriptions  Medication Sig Dispense Refill  . amiodarone (PACERONE) 200 MG tablet Take 1 tablet (200 mg total) by mouth 2 (two) times daily.  60 tablet  1  .  aspirin EC 81 MG EC tablet Take 1 tablet (81 mg total) by mouth daily.      Marland Kitchen ceFAZolin (ANCEF) 2-3 GM-% SOLR 2 g IV every Tuesday-Thursday-Saturday with dialysis, last dose 05/17/2014      . cinacalcet (SENSIPAR) 60 MG tablet Take 60 mg by mouth daily.      . darbepoetin (ARANESP) 100 MCG/0.5ML SOLN injection Inject 0.5 mLs (100 mcg total) into the vein every Saturday with hemodialysis.  4.2 mL    . lisinopril (PRINIVIL,ZESTRIL) 20 MG tablet Take 20 mg by mouth daily.      . sevelamer carbonate (RENVELA) 800 MG tablet Take 2 tablets (1,600 mg total) by mouth 3 (three) times daily with meals.  180 tablet  1  . warfarin (COUMADIN) 5 MG tablet 5 mg po daily or as directed  30 tablet  1  . oxyCODONE (OXY IR/ROXICODONE) 5 MG immediate release tablet Take 1-2 tablets (5-10 mg total) by mouth every 3 (three) hours as needed for severe pain.  30 tablet  0   No current facility-administered medications for this visit.      Physical Exam:   BP 119/74  Pulse 93  Ht 5\' 11"  (1.803 m)  Wt 217 lb (98.431 kg)  BMI 30.28 kg/m2  SpO2 94%  General:  Well-appearing  Chest:   Clear with slightly diminished breath sounds right lung base  CV:   Regular rate and rhythm without murmur  Incisions:  Clean and dry and healing nicely  Abdomen:  Soft nontender  Extremities:  Warm and well-perfused, distal pulses diminished but palpable at the ankle  Diagnostic Tests:  n/a   Impression:  Patient appears to be doing well more than 3 weeks status post minimally invasive mitral valve repair for MSSA bacterial endocarditis complicated by perforation of the mitral valve with acute on chronic combined systolic and diastolic congestive heart failure and septic embolization to the left leg, spleen, and small bowel.     Plan:  We will obtain a routine followup chest x-ray today and check a prothrombin time.  We will call to verify whether or not the patient's prothrombin time has been checked at dialysis.  If he is  now therapeutic on Coumadin and aspirin therapy could be discontinued.  As long as he remains in sinus rhythm and he could be taken off of Coumadin in 2-3 months.  The patient has been reminded to keep his upcoming appointments with cardiology, vascular surgery and infectious disease.  I've instructed the patient to decrease his dose of amiodarone to 200 mg daily.  We will plan to see him back in 4 weeks to make sure that he continues to recover uneventfully.  In the meanwhile I've encouraged patient to continue to increase his physical activity as tolerated.   Valentina Gu. Roxy Manns, MD 04/28/2014 3:33 PM    Addendum:  CXR looks good with very mild residual atelectasis in the right lung field.  Valentina Gu. Roxy Manns, MD 04/28/2014 4:36 PM

## 2014-04-30 DIAGNOSIS — D631 Anemia in chronic kidney disease: Secondary | ICD-10-CM | POA: Diagnosis not present

## 2014-04-30 DIAGNOSIS — R7881 Bacteremia: Secondary | ICD-10-CM | POA: Diagnosis not present

## 2014-04-30 DIAGNOSIS — N2581 Secondary hyperparathyroidism of renal origin: Secondary | ICD-10-CM | POA: Diagnosis not present

## 2014-04-30 DIAGNOSIS — D509 Iron deficiency anemia, unspecified: Secondary | ICD-10-CM | POA: Diagnosis not present

## 2014-04-30 DIAGNOSIS — N186 End stage renal disease: Secondary | ICD-10-CM | POA: Diagnosis not present

## 2014-05-02 ENCOUNTER — Encounter: Payer: Self-pay | Admitting: Vascular Surgery

## 2014-05-03 ENCOUNTER — Ambulatory Visit (INDEPENDENT_AMBULATORY_CARE_PROVIDER_SITE_OTHER): Payer: Medicare Other | Admitting: Vascular Surgery

## 2014-05-03 ENCOUNTER — Encounter: Payer: Self-pay | Admitting: Vascular Surgery

## 2014-05-03 VITALS — BP 138/76 | HR 102 | Temp 98.7°F | Resp 18 | Ht 71.0 in | Wt 222.0 lb

## 2014-05-03 DIAGNOSIS — Z48812 Encounter for surgical aftercare following surgery on the circulatory system: Secondary | ICD-10-CM

## 2014-05-03 DIAGNOSIS — D509 Iron deficiency anemia, unspecified: Secondary | ICD-10-CM | POA: Diagnosis not present

## 2014-05-03 DIAGNOSIS — N186 End stage renal disease: Secondary | ICD-10-CM | POA: Diagnosis not present

## 2014-05-03 DIAGNOSIS — R7881 Bacteremia: Secondary | ICD-10-CM | POA: Diagnosis not present

## 2014-05-03 DIAGNOSIS — I739 Peripheral vascular disease, unspecified: Secondary | ICD-10-CM

## 2014-05-03 DIAGNOSIS — D631 Anemia in chronic kidney disease: Secondary | ICD-10-CM | POA: Diagnosis not present

## 2014-05-03 DIAGNOSIS — N2581 Secondary hyperparathyroidism of renal origin: Secondary | ICD-10-CM | POA: Diagnosis not present

## 2014-05-03 NOTE — Progress Notes (Signed)
    Established Previous Bypass  History of Present Illness  Timothy Palmer is a 48 y.o. (1965/12/08) male who presents for follow up status post above-knee to below-knee popliteal bypass with reversed great saphenous vein on 03/25/14 with Dr. Donnetta Hutching. While admitted for heart failure and endocarditis workup, he had acute ischemia of his left leg. He had no prior history of claudication. 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 He has since had a mitral valve replacement by Dr. Roxy Manns.  He is currently on coumadin. He denies any further pain in his legs.   On ROS today: he notes some numbness of his left toes and burning of his the lateral right thigh. He denies any chest pain or shortness of breath.   Physical Examination  Filed Vitals:   05/03/14 1612  BP: 138/76  Pulse: 102  Temp: 98.7 F (37.1 C)  Resp: 18  Height: 5\' 11"  (1.803 m)  Weight: 222 lb (100.699 kg)  SpO2: 97%   Body mass index is 30.98 kg/(m^2).  Physical Examination  Filed Vitals:   05/03/14 1612  BP: 138/76  Pulse: 102  Temp: 98.7 F (37.1 C)  Resp: 18  Height: 5\' 11"  (1.803 m)  Weight: 222 lb (100.699 kg)  SpO2: 97%   Body mass index is 30.98 kg/(m^2).  General: A&O x 3, WD overweight male in NAD  Pulmonary: Sym exp, good air movt, CTAB, no rales, rhonchi, & wheezing   Cardiac: RRR, Nl S1, S2, no Murmurs, rubs or gallops  Vascular: 2+ left popliteal and 2+ pedal pulses bilaterally   Extremities: Left leg incisions healing well. Right upper groin incision with minor area of separation. Clean without evidence of infection.   Medical Decision Making  Timothy Palmer is a 48 y.o. male who is s/p above the knee to below knee popliteal bypass with reversed great saphenous vein. 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 He is s/p mitral valve replacement 04/05/14  The patient is doing well post-operatively with good pulses bilaterally. His incisions are healing well. He  denies any claudication symptoms. He will follow up in 3 months for left lower extremity duplex and ABIs.   Virgina Jock, PA-C [facility] Office: [phone] Pager: [phone]  05/03/2014, 5:14 PM  This patient was seen in conjunction with Dr. Donnetta Hutching.    I have examined the patient, reviewed and agree with above.  EARLY, TODD, MD 05/04/2014 3:10 PM

## 2014-05-04 SURGERY — ECHOCARDIOGRAM, TRANSESOPHAGEAL
Anesthesia: Moderate Sedation

## 2014-05-05 DIAGNOSIS — N2581 Secondary hyperparathyroidism of renal origin: Secondary | ICD-10-CM | POA: Diagnosis not present

## 2014-05-05 DIAGNOSIS — E1129 Type 2 diabetes mellitus with other diabetic kidney complication: Secondary | ICD-10-CM | POA: Diagnosis not present

## 2014-05-05 DIAGNOSIS — D509 Iron deficiency anemia, unspecified: Secondary | ICD-10-CM | POA: Diagnosis not present

## 2014-05-05 DIAGNOSIS — N186 End stage renal disease: Secondary | ICD-10-CM | POA: Diagnosis not present

## 2014-05-05 DIAGNOSIS — D631 Anemia in chronic kidney disease: Secondary | ICD-10-CM | POA: Diagnosis not present

## 2014-05-05 DIAGNOSIS — R791 Abnormal coagulation profile: Secondary | ICD-10-CM | POA: Diagnosis not present

## 2014-05-05 DIAGNOSIS — R7881 Bacteremia: Secondary | ICD-10-CM | POA: Diagnosis not present

## 2014-05-07 DIAGNOSIS — R7881 Bacteremia: Secondary | ICD-10-CM | POA: Diagnosis not present

## 2014-05-07 DIAGNOSIS — Z992 Dependence on renal dialysis: Secondary | ICD-10-CM | POA: Diagnosis not present

## 2014-05-07 DIAGNOSIS — D631 Anemia in chronic kidney disease: Secondary | ICD-10-CM | POA: Diagnosis not present

## 2014-05-07 DIAGNOSIS — N186 End stage renal disease: Secondary | ICD-10-CM | POA: Diagnosis not present

## 2014-05-07 DIAGNOSIS — D509 Iron deficiency anemia, unspecified: Secondary | ICD-10-CM | POA: Diagnosis not present

## 2014-05-07 DIAGNOSIS — N2581 Secondary hyperparathyroidism of renal origin: Secondary | ICD-10-CM | POA: Diagnosis not present

## 2014-05-10 DIAGNOSIS — D631 Anemia in chronic kidney disease: Secondary | ICD-10-CM | POA: Diagnosis not present

## 2014-05-10 DIAGNOSIS — N2581 Secondary hyperparathyroidism of renal origin: Secondary | ICD-10-CM | POA: Diagnosis not present

## 2014-05-10 DIAGNOSIS — N186 End stage renal disease: Secondary | ICD-10-CM | POA: Diagnosis not present

## 2014-05-10 DIAGNOSIS — D509 Iron deficiency anemia, unspecified: Secondary | ICD-10-CM | POA: Diagnosis not present

## 2014-05-10 DIAGNOSIS — R7881 Bacteremia: Secondary | ICD-10-CM | POA: Diagnosis not present

## 2014-05-12 DIAGNOSIS — R791 Abnormal coagulation profile: Secondary | ICD-10-CM | POA: Diagnosis not present

## 2014-05-16 ENCOUNTER — Ambulatory Visit (HOSPITAL_COMMUNITY)
Admission: RE | Admit: 2014-05-16 | Discharge: 2014-05-16 | Disposition: A | Discharge status: Home / Self Care | Payer: Medicare Other | Source: Ambulatory Visit | Attending: Cardiology | Admitting: Cardiology

## 2014-05-16 VITALS — BP 138/78 | HR 82 | Resp 18 | Wt 225.4 lb

## 2014-05-16 DIAGNOSIS — I82621 Acute embolism and thrombosis of deep veins of right upper extremity: Secondary | ICD-10-CM | POA: Diagnosis not present

## 2014-05-16 DIAGNOSIS — I10 Essential (primary) hypertension: Secondary | ICD-10-CM

## 2014-05-16 DIAGNOSIS — I5030 Unspecified diastolic (congestive) heart failure: Secondary | ICD-10-CM | POA: Insufficient documentation

## 2014-05-16 DIAGNOSIS — I089 Rheumatic multiple valve disease, unspecified: Secondary | ICD-10-CM | POA: Diagnosis not present

## 2014-05-16 DIAGNOSIS — Z79891 Long term (current) use of opiate analgesic: Secondary | ICD-10-CM | POA: Diagnosis not present

## 2014-05-16 DIAGNOSIS — Z7901 Long term (current) use of anticoagulants: Secondary | ICD-10-CM | POA: Diagnosis not present

## 2014-05-16 DIAGNOSIS — Z992 Dependence on renal dialysis: Secondary | ICD-10-CM | POA: Insufficient documentation

## 2014-05-16 DIAGNOSIS — I120 Hypertensive chronic kidney disease with stage 5 chronic kidney disease or end stage renal disease: Secondary | ICD-10-CM | POA: Diagnosis not present

## 2014-05-16 DIAGNOSIS — N186 End stage renal disease: Secondary | ICD-10-CM | POA: Diagnosis not present

## 2014-05-16 DIAGNOSIS — Z79899 Other long term (current) drug therapy: Secondary | ICD-10-CM | POA: Insufficient documentation

## 2014-05-16 DIAGNOSIS — I4891 Unspecified atrial fibrillation: Secondary | ICD-10-CM | POA: Insufficient documentation

## 2014-05-16 DIAGNOSIS — I82401 Acute embolism and thrombosis of unspecified deep veins of right lower extremity: Secondary | ICD-10-CM | POA: Diagnosis not present

## 2014-05-16 DIAGNOSIS — R0600 Dyspnea, unspecified: Secondary | ICD-10-CM | POA: Insufficient documentation

## 2014-05-16 DIAGNOSIS — I330 Acute and subacute infective endocarditis: Secondary | ICD-10-CM | POA: Diagnosis not present

## 2014-05-16 DIAGNOSIS — Z7982 Long term (current) use of aspirin: Secondary | ICD-10-CM | POA: Diagnosis not present

## 2014-05-16 DIAGNOSIS — R509 Fever, unspecified: Secondary | ICD-10-CM | POA: Diagnosis not present

## 2014-05-16 DIAGNOSIS — I5032 Chronic diastolic (congestive) heart failure: Secondary | ICD-10-CM | POA: Diagnosis not present

## 2014-05-16 MED ORDER — AMIODARONE HCL 200 MG PO TABS: 400.0000 mg | ORAL_TABLET | Freq: Every day | ORAL | Status: DC

## 2014-05-16 MED ORDER — AMIODARONE HCL 400 MG PO TABS: 400.0000 mg | ORAL_TABLET | Freq: Every day | ORAL | Status: DC

## 2014-05-16 NOTE — Progress Notes (Signed)
Patient ID: Timothy Palmer, male   DOB: 02-25-1966, 48 y.o.   MRN: [id number] PCP: Primary Cardiologist: Timothy Palmer Nephrology: Timothy Palmer ID: Timothy Palmer  HPI: Timothy Palmer is a 48 year old with ESRD on HD via left arm fistula since 2005, DVT RUE, and HTN. 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   Admitted to [facility] September 15 with increased dyspnea. He  presented with fever and dyspnea with evidence for CHF. He was found to have severe Timothy and MV endocarditis. He had minimally invasive MVR. His stay was also complicated by embolus in L popliteal artery requiring L popliteal endarterectomy. ID followed closely with recommendations to complete 6 week course of antibiotics.   He returns for post hospital follow up. Complains of ongoing dyspnea but improving.  Appetite comes and goes. Weight at home 225 pounds.  Has HD Tues/Thurs/Sat. He says Timothy Palmer cut back amiodarone to 200 mg once a day but his heart rate was increased at dialysis and he was increased back to 200 mg twice a day.   ROS: All systems negative except as listed in HPI, PMH and Problem List.  SH:  History   Social History  . Marital Status: Single    Spouse Name: N/A    Number of Children: N/A  . Years of Education: N/A   Occupational History  . Not on file.   Social History Main Topics  . Smoking status: Never Smoker   . Smokeless tobacco: Never Used  . Alcohol Use: Yes     Comment: 03/23/2014 "glass or 2 of gin twice/month"  . Drug Use: Yes    Special: Marijuana     Comment: last used 03/20/14  . Sexual Activity: No   Other Topics Concern  . Not on file   Social History Narrative  . No narrative on file    FH:  Family History  Problem Relation Age of Onset  . Diabetes Mother   . Hypertension Mother   . Hypertension Father   . Hypertension Sister   . Hypertension Brother     Past Medical History  Diagnosis Date  . Hypertension   . Peripheral vascular disease   . Pneumonia 2014  . ESRD  (end stage renal disease) on dialysis     [place],Dialysis- T,Th,S  . Acute on chronic diastolic heart failure 123XX123  . Severe mitral regurgitation 03/23/2014    by TEE  . Positive blood culture 11/27/2013    MSSA  . Positive blood culture 02/24/2014    MSSA  . DVT of upper extremity (deep vein thrombosis) 03/04/2014    Right arm  . Knee pain, left 03/21/2014  . Acute combined systolic and diastolic congestive heart failure 03/21/2014  . Chronic diastolic congestive heart failure   . 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 Septic embolism to left lower extremity 03/25/2014  . 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 The aneurysm measures 1.4 x 1.1 cm which is  significantly larger than the 4.5 mm parent vessel.   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 S/P minimally invasive mitral valve repair 04/05/2014    Complex valvuloplasty including autologous pericardial patch repair of large perforation of posterior leaflet due to endocarditis with 28 mm Sorin Memo 3D ring annuloplasty via right mini thoracotomy approach  Current Outpatient Prescriptions  Medication Sig Dispense Refill  . amiodarone (PACERONE) 200 MG tablet Take 1 tablet (200 mg total) by mouth daily. 60 tablet 1  . aspirin EC 81 MG EC tablet Take 1 tablet (81 mg total) by mouth daily.    Marland Kitchen ceFAZolin (ANCEF) 2-3 GM-% SOLR 2 g IV every Tuesday-Thursday-Saturday with dialysis, last dose 05/17/2014    . cinacalcet (SENSIPAR) 60 MG tablet Take 60 mg by mouth daily.    . darbepoetin (ARANESP) 100 MCG/0.5ML SOLN injection Inject 0.5 mLs (100 mcg total) into the vein every Saturday with hemodialysis. 4.2 mL   . lisinopril (PRINIVIL,ZESTRIL) 20 MG tablet Take 20 mg by mouth daily.    Marland Kitchen oxyCODONE (OXY  IR/ROXICODONE) 5 MG immediate release tablet Take 1-2 tablets (5-10 mg total) by mouth every 3 (three) hours as needed for severe pain. 30 tablet 0  . sevelamer carbonate (RENVELA) 800 MG tablet Take 2 tablets (1,600 mg total) by mouth 3 (three) times daily with meals. 180 tablet 1  . warfarin (COUMADIN) 5 MG tablet 5 mg po daily or as directed 30 tablet 1   No current facility-administered medications for this encounter.    Filed Vitals:   05/16/14 1145  BP: 138/78  Pulse: 82  Resp: 18  Weight: 225 lb 6 oz (102.229 kg)  SpO2: 98%    PHYSICAL EXAM:  General:  Well appearing. No resp difficulty HEENT: normal Neck: supple. JVP 6-7. Carotids 2+ bilaterally; no bruits. No lymphadenopathy or thryomegaly appreciated. Cor: PMI normal. Regular rate & rhythm. No rubs, gallops or murmurs. Lungs: clear Abdomen: soft, nontender, nondistended. No hepatosplenomegaly. No bruits or masses. Good bowel sounds. Extremities: no cyanosis, clubbing, rash, edema. LUE fistula. LLE 1+ edema. No edema RLE.  Neuro: alert & orientedx3, cranial nerves grossly intact. Moves all 4 extremities w/o difficulty. Affect pleasant.   EKG: NSR 87 bpm    ASSESSMENT & PLAN: 48 yo with ESRD on HD via left arm fistula and HTN presented with fever and dyspnea with evidence for CHF. He was found to have severe Timothy and MV endocarditis. He subsequently had minimally invasive MVR.  1. MV MSSA Endocarditis S/P Minimally Invasive MVR 04/05/14  Completing 6 week antibiotic course. Followed by ID.  Check ECHO at next visit with Timothy Palmer.  2. Chronic Diastolic HF - Voluem status ok. On HD three times a week.  3.ESRD - Per Timothy Palmer. HD Tues/Thurs/Sat 4. RLE.  Embolus S/P 03/25/14  L popliteal endarterectomy  on coumadin  5. A fib - On amiodarone 200 mg twice a day. Today he is in NSR. Cut back to 200 mg once a daily. Will need TSH LFTs. I have provided him a prescription for BMET and TSH from Nephrology. 6. HTN - Ok. On  lisinopril 20 mg daily.   Follow up in 1 month with Timothy Precious Bard NP-C  12:14 PM

## 2014-05-16 NOTE — Patient Instructions (Addendum)
Follow up in 4 weeks with our doctors. You will have an echocardiogram before this visit to assess your heart's functionality.  The doctor will read these results at this appointment.  Happy Thanksgiving!  Do the following things EVERYDAY: 1) Weigh yourself in the morning before breakfast. Write it down and keep it in a log. 2) Take your medicines as prescribed 3) Eat low salt foods-Limit salt (sodium) to 2000 mg per day.  4) Stay as active as you can everyday 5) Limit all fluids for the day to less than 2 liters

## 2014-05-18 ENCOUNTER — Encounter: Payer: Self-pay | Admitting: Internal Medicine

## 2014-05-18 ENCOUNTER — Ambulatory Visit (INDEPENDENT_AMBULATORY_CARE_PROVIDER_SITE_OTHER): Payer: Medicare Other | Admitting: Licensed Clinical Social Worker

## 2014-05-18 ENCOUNTER — Ambulatory Visit (INDEPENDENT_AMBULATORY_CARE_PROVIDER_SITE_OTHER): Payer: Medicare Other | Admitting: Internal Medicine

## 2014-05-18 VITALS — BP 162/104 | HR 90 | Temp 98.3°F | Ht 71.0 in | Wt 222.0 lb

## 2014-05-18 DIAGNOSIS — B958 Unspecified staphylococcus as the cause of diseases classified elsewhere: Secondary | ICD-10-CM | POA: Diagnosis not present

## 2014-05-18 DIAGNOSIS — Z23 Encounter for immunization: Secondary | ICD-10-CM

## 2014-05-18 DIAGNOSIS — I330 Acute and subacute infective endocarditis: Secondary | ICD-10-CM

## 2014-05-18 NOTE — Progress Notes (Signed)
Patient ID: Timothy Palmer, male   DOB: 1966/03/19, 48 y.o.   MRN: [id number]         [facility]  Patient Active Problem List   Diagnosis Date Noted  . S/P minimally invasive mitral valve repair 04/05/2014    Priority: High  . 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 Mitral valve vegetation 03/23/2014    Priority: High  . 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 Septic embolism to left lower extremity 03/25/2014  . 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 Chronic diastolic congestive heart failure   . Severe mitral regurgitation 03/23/2014  . Hypertension 03/22/2014  . Acute bronchitis 03/22/2014  . T wave inversion in EKG 03/22/2014  . QT prolongation 03/22/2014  . Acute combined systolic and diastolic congestive heart failure 03/21/2014  . Knee pain, left 03/21/2014  . DVT of upper extremity (deep vein thrombosis) 03/04/2014  . Positive blood culture 11/27/2013  . End stage renal disease 06/30/2013    Patient's Medications  New Prescriptions   No medications on file  Previous Medications   AMIODARONE (PACERONE) 400 MG TABLET    Take 1 tablet (400 mg total) by mouth daily.   ASPIRIN EC 81 MG EC TABLET    Take 1 tablet (81 mg total) by mouth daily.   CINACALCET (SENSIPAR) 60 MG TABLET    Take 60 mg by mouth daily.   DARBEPOETIN (ARANESP) 100 MCG/0.5ML SOLN INJECTION    Inject 0.5 mLs (100 mcg total) into the vein every Saturday with hemodialysis.   LISINOPRIL (PRINIVIL,ZESTRIL) 20 MG TABLET    Take 20 mg by mouth daily.   OXYCODONE (OXY IR/ROXICODONE) 5 MG IMMEDIATE RELEASE TABLET    Take 1-2 tablets (5-10 mg total) by mouth every 3 (three) hours as needed for severe pain.   SEVELAMER CARBONATE (RENVELA) 800 MG TABLET    Take 2 tablets (1,600 mg total) by mouth 3 (three) times daily with  meals.   WARFARIN (COUMADIN) 5 MG TABLET    5 mg po daily or as directed  Modified Medications   No medications on file  Discontinued Medications   CEFAZOLIN (ANCEF) 2-3 GM-% SOLR    2 g IV every Tuesday-Thursday-Saturday with dialysis, last dose 05/17/2014    Subjective: Mr. Kise is in for his hospital follow-up visit. He developed MSSA bacteremia in May of this year. He recalls being treated for about 6 weeks with antibiotics after hemodialysis. Apparently he developed a second episode of bacteria in August and was receiving antibiotics. He developed sudden pain in his left leg leading to admission. 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 Admission blood cultures on 9/15 were negative. He underwent embolectomy followed by saphenous vein bypass of his left popliteal embolus. He also underwent mitral valve replacement on September 29. Mitral valve Gram stain and cultures were negative. He has just completed 6 weeks of postoperative IV cefazolin given after dialysis. He is feeling better. His wounds are healing. He has not had any further fever, chills or sweats. He has tolerated his antibiotic well. Review of Systems: Pertinent items are noted in HPI.  Past Medical History  Diagnosis Date  . Hypertension   . Peripheral vascular  disease   . Pneumonia 2014  . ESRD (end stage renal disease) on dialysis     [place],Dialysis- T,Th,S  . Acute on chronic diastolic heart failure [id number]  . Severe mitral regurgitation 03/23/2014    by TEE  . Positive blood culture 11/27/2013    MSSA  . Positive blood culture 02/24/2014    MSSA  . DVT of upper extremity (deep vein thrombosis) 03/04/2014    Right arm  . Knee pain, left 03/21/2014  . Acute combined systolic and diastolic congestive heart failure 03/21/2014  .  Chronic diastolic congestive heart failure   . 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 Septic embolism to left lower extremity 03/25/2014  . 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 The aneurysm measures 1.4 x 1.1 cm which is  significantly larger than the 4.5 mm parent vessel.   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 S/P minimally invasive mitral valve repair 04/05/2014    Complex valvuloplasty including autologous pericardial patch repair of large perforation of posterior leaflet due to endocarditis with 28 mm Sorin Memo 3D ring annuloplasty via right mini thoracotomy approach    History  Substance Use Topics  . Smoking status: Never Smoker   . Smokeless tobacco: Never Used  . Alcohol Use: Yes     Comment: 03/23/2014 "glass or 2 of gin twice/month"    Family History  Problem Relation Age of Onset  . Diabetes Mother   . Hypertension Mother   . Hypertension Father   . Hypertension Sister   . Hypertension Brother     No Known Allergies  Objective: Temp: 98.3 F (36.8 C) (11/11 1520) Temp Source: Oral (11/11 1520) BP: 162/104 mmHg (11/11 1520) Pulse Rate: 90 (11/11 1520)  General: he is in no distress Skin: left leg and right anterior chest surgical incisions are healing nicely without evidence of infection Lungs: clear Cor: regular S1 and S2 with an early 1/6 systolic murmur heard at right upper sternal border Abdomen: obese, soft and nontender Left arm AV fistula has a good thrill and no signs of infection    Assessment: I am hopeful that his severe staph aureus infection has been cured. The plan outlined in his discharge summary and my partner, Dr. Edwena Felty, last progress note indicated his last day of antibiotic therapy was due to be  November 10.  Plan: 1. Discontinue cefazolin and observe off of antibiotics. We have notified his hemodialysis center 2. Follow-up here in 4 weeks   Michel Bickers, MD [facility] [phone] pager   [phone] cell 05/18/2014, 3:44 PM

## 2014-05-19 DIAGNOSIS — R791 Abnormal coagulation profile: Secondary | ICD-10-CM | POA: Diagnosis not present

## 2014-05-26 ENCOUNTER — Other Ambulatory Visit: Payer: Self-pay | Admitting: Thoracic Surgery (Cardiothoracic Vascular Surgery)

## 2014-05-26 DIAGNOSIS — R791 Abnormal coagulation profile: Secondary | ICD-10-CM | POA: Diagnosis not present

## 2014-05-26 DIAGNOSIS — I5032 Chronic diastolic (congestive) heart failure: Secondary | ICD-10-CM

## 2014-05-26 DIAGNOSIS — I76 Septic arterial embolism: Secondary | ICD-10-CM

## 2014-05-30 ENCOUNTER — Ambulatory Visit
Admission: RE | Admit: 2014-05-30 | Discharge: 2014-05-30 | Disposition: A | Discharge status: Home / Self Care | Payer: Medicare Other | Source: Ambulatory Visit | Attending: Thoracic Surgery (Cardiothoracic Vascular Surgery) | Admitting: Thoracic Surgery (Cardiothoracic Vascular Surgery)

## 2014-05-30 ENCOUNTER — Ambulatory Visit (INDEPENDENT_AMBULATORY_CARE_PROVIDER_SITE_OTHER): Payer: Self-pay | Admitting: Physician Assistant

## 2014-05-30 VITALS — BP 150/98 | HR 96 | Resp 20 | Ht 71.0 in | Wt 222.0 lb

## 2014-05-30 DIAGNOSIS — Z952 Presence of prosthetic heart valve: Secondary | ICD-10-CM | POA: Diagnosis not present

## 2014-05-30 DIAGNOSIS — J9809 Other diseases of bronchus, not elsewhere classified: Secondary | ICD-10-CM | POA: Diagnosis not present

## 2014-05-30 DIAGNOSIS — I517 Cardiomegaly: Secondary | ICD-10-CM | POA: Diagnosis not present

## 2014-05-30 DIAGNOSIS — I330 Acute and subacute infective endocarditis: Secondary | ICD-10-CM

## 2014-05-30 DIAGNOSIS — Z9889 Other specified postprocedural states: Secondary | ICD-10-CM

## 2014-05-30 DIAGNOSIS — I5032 Chronic diastolic (congestive) heart failure: Secondary | ICD-10-CM

## 2014-05-30 NOTE — Progress Notes (Signed)
[address]       [place] [address]             [phone]          HPI: Patient returns for routine postoperative follow-up having undergone minimally invasive mitral valve repair on 04/05/2014 for MSSA bacterial endocarditis with severe mitral regurgitation and acute combined systolic and diastolic congestive heart failure with septic embolization to the left lower extremity, spleen, and small intestine. Early postoperatively, he had recurrent paroxysmal atrial fibrillation for which the patient was treated with amiodarone. He was discharged from the hospital in sinus rhythm on both low-dose aspirin and warfarin for anticoagulation.   Since his last visit, the patient was seen by Infectious Disease and antibiotics were discontinued.  He does have follow up with them on December 4.  The patient also was seen by cardiology.  At that visit, it was noted that although Dr. Roxy Manns had decreased his Amiodarone to 200 mg daily, the dialysis center had him increase it to 200 mg bid due to some tachycardia noted while on HD. Since he remained in sinus rhythm and heart rate was otherwise stable, Amy Clegg, NP, asked the patient to go ahead and cut the dose back to once daily.  He is scheduled to follow up with them in early December for a repeat echo.  He also has been seen in the [facility] for follow up of his recent fem-pop bypass and is doing well.  The patient continues to fatigue easily, but denies chest pain, fevers, or dyspnea.  His appetite is getting better and lower extremity edema is improved.  His only complaint today is some swelling around his incision which developed after he slept on his right side.  No pain, erythema or drainage.  He has been getting his INR checked at dialysis, but states he forgot to bring the sheet with his lab values.  His only other complaint is a dry, nonproductive cough that has been present and stable since surgery.    Current Outpatient  Prescriptions  Medication Sig Dispense Refill  . amiodarone (PACERONE) 400 MG tablet Take 1 tablet (400 mg total) by mouth daily. 90 tablet 3  . aspirin EC 81 MG EC tablet Take 1 tablet (81 mg total) by mouth daily.    . cinacalcet (SENSIPAR) 60 MG tablet Take 60 mg by mouth daily.    . darbepoetin (ARANESP) 100 MCG/0.5ML SOLN injection Inject 0.5 mLs (100 mcg total) into the vein every Saturday with hemodialysis. 4.2 mL   . lisinopril (PRINIVIL,ZESTRIL) 20 MG tablet Take 20 mg by mouth daily.    Marland Kitchen oxyCODONE (OXY IR/ROXICODONE) 5 MG immediate release tablet Take 1-2 tablets (5-10 mg total) by mouth every 3 (three) hours as needed for severe pain. 30 tablet 0  . sevelamer carbonate (RENVELA) 800 MG tablet Take 2 tablets (1,600 mg total) by mouth 3 (three) times daily with meals. 180 tablet 1  . warfarin (COUMADIN) 5 MG tablet 5 mg po daily or as directed 30 tablet 1   No current facility-administered medications for this visit.     Physical Exam: BP 150/98 HR 96 Resp 20 Wounds: Thoracotomy incision is healing well.  There is some puffiness surrounding the wound which does not feel fluctuant.  There is no drainage, erythema or tenderness.  There is also a small raised lesion just lateral to the incision which is also not erythematous, fluctuant or tender.   Heart: regular rate  and rhythm Lungs: Clear to auscultation Extremities: Mild LE edema   Diagnostic Tests: Chest xray: Dg Chest 2 View  05/30/2014   CLINICAL DATA:  History of chronic congestive heart failure, valve replacement in October, cough  EXAM: CHEST  2 VIEW  COMPARISON:  Chest x-ray of 04/28/2014  FINDINGS: Chronic changes on the right are stable with elevation of the right hemidiaphragm and linear scarring. No infiltrate or effusion is seen. There is peribronchial thickening which may indicate bronchitis. Cardiomegaly is stable. No bony abnormality is seen.  IMPRESSION: Stable chronic change on the right. Peribronchial  thickening may indicate bronchitis. Stable cardiomegaly.   Electronically Signed   By: Ivar Drape M.D.   On: 05/30/2014 16:05       Assessment/Plan: The patient is doing well status post mini MV repair.  He is now off antibiotics and is maintaining sinus rhythm.  He is scheduled for additional follow up with cardiology and infectious disease in the next week or so.  He does describe a dry cough which has been present since surgery, but lungs are clear today and chest x-ray is generally stable.  This may be related to his ACE-I, but I will leave it up to cardiology to address this.  His blood pressure is elevated today, but he just left dialysis (additional session prior to the holiday) and states that it was not that high on HD.  There is some mild swelling around his incision, but no signs of infection.  We will continue his current care for now and have him follow up in 2 weeks with Dr. Roxy Manns.

## 2014-06-06 DIAGNOSIS — N182 Chronic kidney disease, stage 2 (mild): Secondary | ICD-10-CM | POA: Diagnosis not present

## 2014-06-06 DIAGNOSIS — Z992 Dependence on renal dialysis: Secondary | ICD-10-CM | POA: Diagnosis not present

## 2014-06-07 DIAGNOSIS — D631 Anemia in chronic kidney disease: Secondary | ICD-10-CM | POA: Diagnosis not present

## 2014-06-07 DIAGNOSIS — N2581 Secondary hyperparathyroidism of renal origin: Secondary | ICD-10-CM | POA: Diagnosis not present

## 2014-06-07 DIAGNOSIS — D509 Iron deficiency anemia, unspecified: Secondary | ICD-10-CM | POA: Diagnosis not present

## 2014-06-07 DIAGNOSIS — N186 End stage renal disease: Secondary | ICD-10-CM | POA: Diagnosis not present

## 2014-06-09 DIAGNOSIS — D631 Anemia in chronic kidney disease: Secondary | ICD-10-CM | POA: Diagnosis not present

## 2014-06-09 DIAGNOSIS — D509 Iron deficiency anemia, unspecified: Secondary | ICD-10-CM | POA: Diagnosis not present

## 2014-06-09 DIAGNOSIS — N2581 Secondary hyperparathyroidism of renal origin: Secondary | ICD-10-CM | POA: Diagnosis not present

## 2014-06-09 DIAGNOSIS — N186 End stage renal disease: Secondary | ICD-10-CM | POA: Diagnosis not present

## 2014-06-11 DIAGNOSIS — N186 End stage renal disease: Secondary | ICD-10-CM | POA: Diagnosis not present

## 2014-06-11 DIAGNOSIS — D631 Anemia in chronic kidney disease: Secondary | ICD-10-CM | POA: Diagnosis not present

## 2014-06-11 DIAGNOSIS — I481 Persistent atrial fibrillation: Secondary | ICD-10-CM | POA: Diagnosis not present

## 2014-06-11 DIAGNOSIS — N2581 Secondary hyperparathyroidism of renal origin: Secondary | ICD-10-CM | POA: Diagnosis not present

## 2014-06-11 DIAGNOSIS — R001 Bradycardia, unspecified: Secondary | ICD-10-CM | POA: Diagnosis not present

## 2014-06-11 DIAGNOSIS — D509 Iron deficiency anemia, unspecified: Secondary | ICD-10-CM | POA: Diagnosis not present

## 2014-06-11 LAB — PROTIME-INR: INR: 1.2 — AB (ref <=1.1)

## 2014-06-13 ENCOUNTER — Ambulatory Visit (INDEPENDENT_AMBULATORY_CARE_PROVIDER_SITE_OTHER): Payer: Self-pay | Admitting: Thoracic Surgery (Cardiothoracic Vascular Surgery)

## 2014-06-13 ENCOUNTER — Ambulatory Visit (HOSPITAL_COMMUNITY)
Admission: RE | Admit: 2014-06-13 | Discharge: 2014-06-13 | Disposition: A | Discharge status: Home / Self Care | Payer: Medicare Other | Source: Ambulatory Visit | Attending: Nephrology | Admitting: Nephrology

## 2014-06-13 ENCOUNTER — Encounter: Payer: Self-pay | Admitting: Thoracic Surgery (Cardiothoracic Vascular Surgery)

## 2014-06-13 ENCOUNTER — Ambulatory Visit (HOSPITAL_BASED_OUTPATIENT_CLINIC_OR_DEPARTMENT_OTHER)
Admission: RE | Admit: 2014-06-13 | Discharge: 2014-06-13 | Disposition: A | Discharge status: Home / Self Care | Payer: Medicare Other | Source: Ambulatory Visit | Attending: Internal Medicine | Admitting: Internal Medicine

## 2014-06-13 VITALS — BP 175/100 | HR 86 | Resp 20 | Ht 71.0 in | Wt 228.0 lb

## 2014-06-13 VITALS — BP 162/88 | HR 90 | Wt 228.5 lb

## 2014-06-13 DIAGNOSIS — Z86718 Personal history of other venous thrombosis and embolism: Secondary | ICD-10-CM | POA: Diagnosis not present

## 2014-06-13 DIAGNOSIS — Z992 Dependence on renal dialysis: Secondary | ICD-10-CM | POA: Diagnosis not present

## 2014-06-13 DIAGNOSIS — I059 Rheumatic mitral valve disease, unspecified: Secondary | ICD-10-CM | POA: Diagnosis not present

## 2014-06-13 DIAGNOSIS — I120 Hypertensive chronic kidney disease with stage 5 chronic kidney disease or end stage renal disease: Secondary | ICD-10-CM | POA: Insufficient documentation

## 2014-06-13 DIAGNOSIS — I724 Aneurysm of artery of lower extremity: Secondary | ICD-10-CM | POA: Diagnosis not present

## 2014-06-13 DIAGNOSIS — Z9889 Other specified postprocedural states: Secondary | ICD-10-CM | POA: Diagnosis not present

## 2014-06-13 DIAGNOSIS — I4891 Unspecified atrial fibrillation: Secondary | ICD-10-CM | POA: Diagnosis not present

## 2014-06-13 DIAGNOSIS — I359 Nonrheumatic aortic valve disorder, unspecified: Secondary | ICD-10-CM | POA: Diagnosis not present

## 2014-06-13 DIAGNOSIS — N186 End stage renal disease: Secondary | ICD-10-CM | POA: Diagnosis not present

## 2014-06-13 DIAGNOSIS — I340 Nonrheumatic mitral (valve) insufficiency: Secondary | ICD-10-CM | POA: Insufficient documentation

## 2014-06-13 DIAGNOSIS — I5032 Chronic diastolic (congestive) heart failure: Secondary | ICD-10-CM | POA: Diagnosis not present

## 2014-06-13 DIAGNOSIS — I330 Acute and subacute infective endocarditis: Secondary | ICD-10-CM

## 2014-06-13 DIAGNOSIS — I509 Heart failure, unspecified: Secondary | ICD-10-CM | POA: Diagnosis not present

## 2014-06-13 DIAGNOSIS — B9561 Methicillin susceptible Staphylococcus aureus infection as the cause of diseases classified elsewhere: Secondary | ICD-10-CM | POA: Diagnosis not present

## 2014-06-13 DIAGNOSIS — I739 Peripheral vascular disease, unspecified: Secondary | ICD-10-CM | POA: Insufficient documentation

## 2014-06-13 DIAGNOSIS — I480 Paroxysmal atrial fibrillation: Secondary | ICD-10-CM | POA: Diagnosis not present

## 2014-06-13 LAB — HEPATIC FUNCTION PANEL
ALT: 7 U/L (ref 0–53)
AST: 13 U/L (ref 0–37)
Albumin: 2.9 g/dL — ABNORMAL LOW (ref 3.5–5.2)
Alkaline Phosphatase: 84 U/L (ref 39–117)
BILIRUBIN TOTAL: 0.4 mg/dL (ref 0.3–1.2)
Total Protein: 7.7 g/dL (ref 6.0–8.3)

## 2014-06-13 LAB — TSH: TSH: 2.87 u[IU]/mL (ref 0.350–4.500)

## 2014-06-13 MED ORDER — CARVEDILOL 6.25 MG PO TABS: 6.2500 mg | ORAL_TABLET | Freq: Two times a day (BID) | ORAL | Status: DC

## 2014-06-13 NOTE — Patient Instructions (Signed)
Stop Aspirin  Start Carvedilol 6.25 mg Twice daily   Labs today  Your physician recommends that you schedule a follow-up appointment in: February

## 2014-06-13 NOTE — Progress Notes (Signed)
  Echocardiogram 2D Echocardiogram has been performed.  Timothy Palmer [name] 06/13/2014, 9:53 AM

## 2014-06-13 NOTE — Progress Notes (Signed)
Patient ID: QUADARIUS LIPUMA, male   DOB: 08-24-1965, 48 y.o.   MRN: [id number] PCP: Primary Cardiologist: Dr Aundra Dubin Nephrology: Dr Moshe Cipro ID: Dr Megan Salon  HPI: Mr Rodkey is a 48 year old with ESRD on HD via left arm fistula since 2005, DVT RUE, and HTN. 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   Admitted to [facility] 9/15 with increased dyspnea. He  presented with fever and dyspnea with evidence for CHF. He was found to have severe MR (valve perforation) and MV endocarditis. He had minimally invasive MVR. 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  He had left popliteal embolectomy followed by above to below knee popliteal bypass. He has completed his antibiotics.  Post-op, he had atrial fibrillation, but that seems to have resolved.  He is in NSR by exam today.   Echo done today was reviewed (but cannot be reported due to system problems).  EF 55-60% s/p MV repair with mean gradient 10 mmHg but PHT not prolonged (61 msec), trivial residual MR, mild AS, PA systolic pressure 36 mmHg.  He is feeling ok.  He is not going to be able to get to cardiac rehab.  He has mild dyspnea after walking 1-1.5 blocks.  He can climb stairs without problems.  Dyspnea walking up a hill.  No palpitations, lightheadedness, orthopnea/PND.  BP is high today.    ROS: All systems negative except as listed in HPI, PMH and Problem List.  SH:  History   Social History  . Marital Status: Single    Spouse Name: N/A    Number of Children: N/A  . Years of Education: N/A   Occupational History  . Not on file.   Social History Main Topics  . Smoking status: Never Smoker   . Smokeless tobacco: Never Used  . Alcohol Use: Yes     Comment: 03/23/2014 "glass or 2 of gin twice/month"  . Drug Use: Yes    Special: Marijuana     Comment: last used 03/20/14  . Sexual Activity: No   Other Topics Concern  . Not on file    Social History Narrative    FH:  Family History  Problem Relation Age of Onset  . Diabetes Mother   . Hypertension Mother   . Hypertension Father   . Hypertension Sister   . Hypertension Brother     Past Medical History  Diagnosis Date  . Hypertension   . Peripheral vascular disease   . Pneumonia 2014  . ESRD (end stage renal disease) on dialysis     [place],Dialysis- T,Th,S  . Acute on chronic diastolic heart failure [id number]  . Severe mitral regurgitation 03/23/2014    by TEE  . Positive blood culture 11/27/2013    MSSA  . Positive blood culture 02/24/2014    MSSA  . DVT of upper extremity (deep vein thrombosis) 03/04/2014    Right arm  . Knee pain, left 03/21/2014  . Acute combined systolic and diastolic congestive heart failure 03/21/2014  . Chronic diastolic congestive heart failure   . 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 Septic embolism to left lower extremity 03/25/2014  . 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 The aneurysm measures 1.4 x 1.1  cm which is  significantly larger than the 4.5 mm parent vessel.   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 S/P minimally invasive mitral valve repair 04/05/2014    Complex valvuloplasty including autologous pericardial patch repair of large perforation of posterior leaflet due to endocarditis with 28 mm Sorin Memo 3D ring annuloplasty via right mini thoracotomy approach    Current Outpatient Prescriptions  Medication Sig Dispense Refill  . amiodarone (PACERONE) 200 MG tablet Take 200 mg by mouth daily.    . cinacalcet (SENSIPAR) 60 MG tablet Take 60 mg by mouth daily.    . darbepoetin (ARANESP) 100 MCG/0.5ML SOLN injection Inject 0.5 mLs (100 mcg total) into the vein every Saturday with  hemodialysis. 4.2 mL   . lisinopril (PRINIVIL,ZESTRIL) 20 MG tablet Take 20 mg by mouth daily.    . sevelamer carbonate (RENVELA) 800 MG tablet Take 2 tablets (1,600 mg total) by mouth 3 (three) times daily with meals. 180 tablet 1  . warfarin (COUMADIN) 5 MG tablet 5 mg po daily or as directed 30 tablet 1  . carvedilol (COREG) 6.25 MG tablet Take 1 tablet (6.25 mg total) by mouth 2 (two) times daily. 60 tablet 3   No current facility-administered medications for this encounter.    Filed Vitals:   06/13/14 0954  BP: 162/88  Pulse: 90  Weight: 228 lb 8 oz (103.647 kg)  SpO2: 96%    PHYSICAL EXAM:  General:  Well appearing. No resp difficulty HEENT: normal Neck: supple. JVP 8 cm. Carotids 2+ bilaterally; no bruits. No lymphadenopathy or thryomegaly appreciated. Cor: PMI normal. Regular rate & rhythm. No rubs, gallops.  2/6 early SEM RUSB.  Lungs: clear Abdomen: soft, nontender, nondistended. No hepatosplenomegaly. No bruits or masses. Good bowel sounds. Extremities: no cyanosis, clubbing, rash, edema. LUE fistula. LLE 1+ edema. No edema RLE.  Neuro: alert & orientedx3, cranial nerves grossly intact. Moves all 4 extremities w/o difficulty. Affect pleasant.  ASSESSMENT & PLAN: 48 yo with ESRD on HD via left arm fistula and HTN presented with fever and dyspnea with evidence for CHF. 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 He subsequently had minimally invasive MVR.  1. Mitral valve MSSA endocarditis with perforation and severe MR: s/p minimally Invasive MV repair 04/05/14.  Echo today showed stable MV repair with trivial MR.  The mean gradient across the valve was elevated but with normal pressure half-time suspect there is not significant stenosis.  2. Chronic diastolic CHF: Probably mild volume overload, controlled by HD.  3. S/p above knee to below knee popliteal bypass in setting of mycotic aneurysm.  To followup with vascular surgery.  4. Atrial fibrillation:  Peri-operative only. He is in NSR today.  Continue amiodarone and warfarin until followup in 2/16.  If no further atrial fibrillation, will discontinue at that time and start ASA 81 daily.  Will check LFTs/TSH today. 5. HTN: Start Coreg 6.25 mg bid today.   Timothy Palmer 06/13/2014

## 2014-06-13 NOTE — Progress Notes (Signed)
Timothy Palmer [address]       [place] [address]             [phone]     CARDIOTHORACIC SURGERY OFFICE NOTE  Referring Provider is Timothy Palmer, Timothy Showers, MD PCP is Timothy Meckel, MD   HPI:  Patient is [name] who returns to the office today for routine followup status post minimally invasive mitral valve repair on 04/05/2014 for MSSA bacterial endocarditis with severe mitral regurgitation dictated by acute combined systolic and diastolic congestive heart failure with septic embolization to the left lower extremity, spleen, and small intestine. Early postoperatively he had recurrent paroxysmal atrial fibrillation for which the patient was treated with amiodarone. He was discharged from the hospital in sinus rhythm on both low-dose aspirin and warfarin for anticoagulation.  Since hospital discharge she has done remarkably well. He was last seen here in our office on 04/28/2014. His prothrombin time and Coumadin dosing has been followed during dialysis. He completed his course of intravenous antibiotics and has been seen in follow-up in the infectious disease clinic. He was seen in follow-up earlier today in the advanced heart failure clinic where he underwent a routine follow-up echocardiogram, the results of which remained pending at this time. He has also been seen in follow-up at [facility].  Overall the patient reports feeling quite well. He has mild exertional shortness of breath that continues to improve. He is reportedly doing well with dialysis treatments. He has not had any recurrent fevers chills or sweats. He still has some chronic lower extremity edema. He is ambulating well without lower extremity pain.  He has developed some swelling in the right breast which he states developed after he slept on his right side. This is not particularly tender or painful to the patient. His appetite is good. Overall he has no complaints.   Current Outpatient  Prescriptions  Medication Sig Dispense Refill  . amiodarone (PACERONE) 200 MG tablet Take 200 mg by mouth daily.    . carvedilol (COREG) 6.25 MG tablet Take 1 tablet (6.25 mg total) by mouth 2 (two) times daily. 60 tablet 3  . cinacalcet (SENSIPAR) 60 MG tablet Take 60 mg by mouth daily.    . darbepoetin (ARANESP) 100 MCG/0.5ML SOLN injection Inject 0.5 mLs (100 mcg total) into the vein every Saturday with hemodialysis. 4.2 mL   . lisinopril (PRINIVIL,ZESTRIL) 20 MG tablet Take 20 mg by mouth daily.    . sevelamer carbonate (RENVELA) 800 MG tablet Take 2 tablets (1,600 mg total) by mouth 3 (three) times daily with meals. 180 tablet 1  . warfarin (COUMADIN) 5 MG tablet 5 mg po daily or as directed 30 tablet 1   No current facility-administered medications for this visit.      Physical Exam:   BP 175/100 mmHg  Pulse 86  Resp 20  Ht 5\' 11"  (1.803 m)  Wt 228 lb (103.42 kg)  BMI 31.81 kg/m2  SpO2 95%  General:  Well-appearing  Chest:   Clear to auscultation  CV:   Regular rate and rhythm without murmur  Incisions:  Healing nicely. The patient does have firm mass in the right pectoralis muscle consistent with likely hematoma  Abdomen:  Soft and nontender  Extremities:  Warm and well perfuse, moderate lower extremity edema  Diagnostic Tests:  n/a   Impression:  Patient appears to be doing well approximately 2 months status post minimally invasive mitral valve repair for MSSA bacterial endocarditis complicated  by perforation of the mitral valve with acute on chronic combined systolic and diastolic congestive heart failure and septic embolization to the left leg, spleen, and small bowel. He has developed a firm mass in the soft tissues of the right breast consistent with likely hematoma. There is no fluctuance or tenderness on exam to suggest an infection.    Plan:  I have encouraged the patient to continue to gradually increase his physical activity as tolerated, although I have  cautioned him to avoid any heavy lifting or strenuous use of his arms or shoulders. I would agree with plans to discontinue and cannulation using warfarin if the patient remains in sinus rhythm over the next 4-6 weeks. We will follow-up results of the routine echocardiogram performed earlier today once they are available. The patient will return for routine follow-up next September, approximately one year after his original surgery.   Timothy Gu. Roxy Manns, MD 06/13/2014 12:52 PM

## 2014-06-13 NOTE — Patient Instructions (Addendum)
The patient may continue to gradually increase their physical activity as tolerated.  They should refrain from any heavy lifting or strenuous use of their arms and shoulders until the swelling and soreness has resolved, and they should avoid activities that cause increased pain in their chest on the side of their surgical incision.  Otherwise they may continue to increase their activities without any particular limitations.   Endocarditis is a potentially serious infection of heart valves or inside lining of the heart.  It occurs more commonly in patients with diseased heart valves (such as patient's with aortic or mitral valve disease) and in patients who have undergone heart valve repair or replacement.  Certain surgical and dental procedures may put you at risk, such as dental cleaning, other dental procedures, or any surgery involving the respiratory, urinary, gastrointestinal tract, gallbladder or prostate gland.   To minimize your chances for develooping endocarditis, maintain good oral health and seek prompt medical attention for any infections involving the mouth, teeth, gums, skin or urinary tract.  Always notify your doctor or dentist about your underlying heart valve condition before having any invasive procedures. You will need to take antibiotics before certain procedures.

## 2014-06-14 ENCOUNTER — Telehealth: Payer: Self-pay | Admitting: Cardiology

## 2014-06-14 DIAGNOSIS — N186 End stage renal disease: Secondary | ICD-10-CM | POA: Diagnosis not present

## 2014-06-14 DIAGNOSIS — D631 Anemia in chronic kidney disease: Secondary | ICD-10-CM | POA: Diagnosis not present

## 2014-06-14 DIAGNOSIS — N2581 Secondary hyperparathyroidism of renal origin: Secondary | ICD-10-CM | POA: Diagnosis not present

## 2014-06-14 DIAGNOSIS — D509 Iron deficiency anemia, unspecified: Secondary | ICD-10-CM | POA: Diagnosis not present

## 2014-06-14 NOTE — Telephone Encounter (Signed)
Spoke with Alica at [facility]. She states pt did have PT/INR drawn today she will send results to [place] tomorrow so that we can start to manage his coumadin therapy. Pt was placed in lab book.

## 2014-06-14 NOTE — Telephone Encounter (Signed)
New Message         Calling wanting to know if we are following Pt's INR for Coumadin. States that it was prescribed by Dr. Aundra Dubin and pt has no past or upcoming Coumadin appts. Please call back and advise.

## 2014-06-14 NOTE — Telephone Encounter (Signed)
Pt was started on coumadin in Oct after surgery and apparently was not set up with [facility] or anyone to follow and manage INR's, spoke w/Alicia and advised that we do not manage coumadin it would need to be arranged through [facility] coumadin clinic or his pcp, she will let Dr Moshe Cipro know, info sent to [facility] to follow up, safety zone report filed

## 2014-06-15 ENCOUNTER — Encounter: Payer: Self-pay | Admitting: Internal Medicine

## 2014-06-15 ENCOUNTER — Ambulatory Visit (INDEPENDENT_AMBULATORY_CARE_PROVIDER_SITE_OTHER): Payer: Medicare Other | Admitting: Internal Medicine

## 2014-06-15 VITALS — BP 164/110 | HR 92 | Temp 98.6°F | Wt 223.0 lb

## 2014-06-15 DIAGNOSIS — I330 Acute and subacute infective endocarditis: Secondary | ICD-10-CM | POA: Diagnosis not present

## 2014-06-15 DIAGNOSIS — B958 Unspecified staphylococcus as the cause of diseases classified elsewhere: Secondary | ICD-10-CM

## 2014-06-15 NOTE — Progress Notes (Signed)
Patient ID: Timothy Palmer, male   DOB: 09/23/1965, 48 y.o.   MRN: [id number]         [facility] for Infectious Disease  Patient Active Problem List   Diagnosis Date Noted  . S/P minimally invasive mitral valve repair 04/05/2014    Priority: High  . 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 Mitral valve vegetation 03/23/2014    Priority: High  . 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 Paroxysmal atrial fibrillation 06/13/2014  . Septic embolism to left lower extremity 03/25/2014  . 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 Chronic diastolic congestive heart failure   . Severe mitral regurgitation 03/23/2014  . Hypertension 03/22/2014  . Acute bronchitis 03/22/2014  . T wave inversion in EKG 03/22/2014  . QT prolongation 03/22/2014  . Acute combined systolic and diastolic congestive heart failure 03/21/2014  . Knee pain, left 03/21/2014  . DVT of upper extremity (deep vein thrombosis) 03/04/2014  . Positive blood culture 11/27/2013  . End stage renal disease 06/30/2013    Patient's Medications  New Prescriptions   No medications on file  Previous Medications   AMIODARONE (PACERONE) 200 MG TABLET    Take 200 mg by mouth daily.   CARVEDILOL (COREG) 6.25 MG TABLET    Take 1 tablet (6.25 mg total) by mouth 2 (two) times daily.   CINACALCET (SENSIPAR) 60 MG TABLET    Take 60 mg by mouth daily.   DARBEPOETIN (ARANESP) 100 MCG/0.5ML SOLN INJECTION    Inject 0.5 mLs (100 mcg total) into the vein every Saturday with hemodialysis.   LISINOPRIL (PRINIVIL,ZESTRIL) 20 MG TABLET    Take 20 mg by mouth daily.   SEVELAMER CARBONATE (RENVELA) 800 MG TABLET    Take 2 tablets (1,600 mg total) by mouth 3 (three) times daily with meals.   WARFARIN (COUMADIN) 5 MG TABLET    5 mg po daily or as directed  Modified Medications   No  medications on file  Discontinued Medications   No medications on file    Subjective: Timothy Palmer is in for his routine follow-up visit. He completed 6 weeks of IV cefazolin one month ago as treatment for presumed relapsed MSSA infection complicated by mitral valve endocarditis and multiple systemic emboli. He is feeling a little bit better. He has not had any fever, chills or sweats. However, he recently noticed some swelling in his right breast. He saw Dr. Roxy Manns 2 days ago who felt it was likely a wound hematoma. It was enlarging in size but has remained stable for the past several days. It is not particularly painful.  Review of Systems: Pertinent items are noted in HPI.  Past Medical History  Diagnosis Date  . Hypertension   . Peripheral vascular disease   . Pneumonia 2014  . ESRD (end stage renal disease) on dialysis     [place],Dialysis- T,Th,S  . Acute on chronic diastolic heart failure 123XX123  . Severe mitral regurgitation 03/23/2014    by TEE  . Positive blood culture 11/27/2013    MSSA  . Positive blood culture 02/24/2014    MSSA  . DVT of upper extremity (deep vein thrombosis) 03/04/2014    Right arm  . Knee pain, left 03/21/2014  . Acute combined systolic and diastolic congestive heart failure 03/21/2014  . Chronic diastolic congestive heart failure   . Bacterial  endocarditis - MSSA positive blood cultures with mitral valve vegetation, severe mitral regurgitation, and septic embolization 03/21/2014  . Septic embolism to left lower extremity 03/25/2014  . 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 The aneurysm measures 1.4 x 1.1 cm which is  significantly larger than the 4.5 mm parent vessel.   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 S/P minimally invasive mitral valve repair 04/05/2014    Complex valvuloplasty including autologous pericardial patch repair  of large perforation of posterior leaflet due to endocarditis with 28 mm Sorin Memo 3D ring annuloplasty via right mini thoracotomy approach    History  Substance Use Topics  . Smoking status: Never Smoker   . Smokeless tobacco: Never Used  . Alcohol Use: Yes     Comment: 03/23/2014 "glass or 2 of gin twice/month"    Family History  Problem Relation Age of Onset  . Diabetes Mother   . Hypertension Mother   . Hypertension Father   . Hypertension Sister   . Hypertension Brother     No Known Allergies  Objective: Temp: 98.6 F (37 C) (12/09 1332) Temp Source: Oral (12/09 1332) BP: 164/110 mmHg (12/09 1332) Pulse Rate: 92 (12/09 1332)  General: He is in no distress Skin: No rash Lungs: Clear Cor: Regular S1 and S2 with an early 1/6 systolic murmur that's unchanged Right breast: Nontender swelling just above his healed incision line. There is no overlying cellulitis. Left arm dialysis access site without evidence of infection  Assessment: I am hopeful that his MSSA infection has been cured. I will continue observation off of antibiotics. I asked him to call right away if he has any fever, chills, sweats or continues to have increasing swelling or pain in his right breast.  Plan: 1. Continue observation off of antibiotics 2. Follow-up in one month   Michel Bickers, MD [facility] [place] [phone] pager   [phone] cell 06/15/2014, 1:44 PM

## 2014-06-16 ENCOUNTER — Encounter (HOSPITAL_COMMUNITY): Payer: Self-pay | Admitting: Cardiology

## 2014-06-16 ENCOUNTER — Ambulatory Visit (INDEPENDENT_AMBULATORY_CARE_PROVIDER_SITE_OTHER): Payer: Medicare Other | Admitting: Internal Medicine

## 2014-06-16 DIAGNOSIS — I76 Septic arterial embolism: Secondary | ICD-10-CM

## 2014-06-16 DIAGNOSIS — I480 Paroxysmal atrial fibrillation: Secondary | ICD-10-CM

## 2014-06-16 DIAGNOSIS — I2690 Septic pulmonary embolism without acute cor pulmonale: Secondary | ICD-10-CM

## 2014-06-16 DIAGNOSIS — D631 Anemia in chronic kidney disease: Secondary | ICD-10-CM | POA: Diagnosis not present

## 2014-06-16 DIAGNOSIS — Z5181 Encounter for therapeutic drug level monitoring: Secondary | ICD-10-CM

## 2014-06-16 DIAGNOSIS — D509 Iron deficiency anemia, unspecified: Secondary | ICD-10-CM | POA: Diagnosis not present

## 2014-06-16 DIAGNOSIS — N186 End stage renal disease: Secondary | ICD-10-CM | POA: Diagnosis not present

## 2014-06-16 DIAGNOSIS — N2581 Secondary hyperparathyroidism of renal origin: Secondary | ICD-10-CM | POA: Diagnosis not present

## 2014-06-16 DIAGNOSIS — R791 Abnormal coagulation profile: Secondary | ICD-10-CM | POA: Diagnosis not present

## 2014-06-18 DIAGNOSIS — D631 Anemia in chronic kidney disease: Secondary | ICD-10-CM | POA: Diagnosis not present

## 2014-06-18 DIAGNOSIS — N2581 Secondary hyperparathyroidism of renal origin: Secondary | ICD-10-CM | POA: Diagnosis not present

## 2014-06-18 DIAGNOSIS — D509 Iron deficiency anemia, unspecified: Secondary | ICD-10-CM | POA: Diagnosis not present

## 2014-06-18 DIAGNOSIS — N186 End stage renal disease: Secondary | ICD-10-CM | POA: Diagnosis not present

## 2014-06-21 DIAGNOSIS — D631 Anemia in chronic kidney disease: Secondary | ICD-10-CM | POA: Diagnosis not present

## 2014-06-21 DIAGNOSIS — N2581 Secondary hyperparathyroidism of renal origin: Secondary | ICD-10-CM | POA: Diagnosis not present

## 2014-06-21 DIAGNOSIS — D509 Iron deficiency anemia, unspecified: Secondary | ICD-10-CM | POA: Diagnosis not present

## 2014-06-21 DIAGNOSIS — N186 End stage renal disease: Secondary | ICD-10-CM | POA: Diagnosis not present

## 2014-06-23 DIAGNOSIS — N186 End stage renal disease: Secondary | ICD-10-CM | POA: Diagnosis not present

## 2014-06-23 DIAGNOSIS — D509 Iron deficiency anemia, unspecified: Secondary | ICD-10-CM | POA: Diagnosis not present

## 2014-06-23 DIAGNOSIS — N2581 Secondary hyperparathyroidism of renal origin: Secondary | ICD-10-CM | POA: Diagnosis not present

## 2014-06-23 DIAGNOSIS — D631 Anemia in chronic kidney disease: Secondary | ICD-10-CM | POA: Diagnosis not present

## 2014-06-24 ENCOUNTER — Ambulatory Visit (INDEPENDENT_AMBULATORY_CARE_PROVIDER_SITE_OTHER): Payer: Medicare Other | Admitting: *Deleted

## 2014-06-24 DIAGNOSIS — Z5181 Encounter for therapeutic drug level monitoring: Secondary | ICD-10-CM

## 2014-06-24 DIAGNOSIS — I76 Septic arterial embolism: Secondary | ICD-10-CM

## 2014-06-24 DIAGNOSIS — I480 Paroxysmal atrial fibrillation: Secondary | ICD-10-CM

## 2014-06-24 DIAGNOSIS — I82629 Acute embolism and thrombosis of deep veins of unspecified upper extremity: Secondary | ICD-10-CM

## 2014-06-24 DIAGNOSIS — I2690 Septic pulmonary embolism without acute cor pulmonale: Secondary | ICD-10-CM

## 2014-06-24 LAB — POCT INR: INR: 1.71

## 2014-06-25 DIAGNOSIS — N2581 Secondary hyperparathyroidism of renal origin: Secondary | ICD-10-CM | POA: Diagnosis not present

## 2014-06-25 DIAGNOSIS — D509 Iron deficiency anemia, unspecified: Secondary | ICD-10-CM | POA: Diagnosis not present

## 2014-06-25 DIAGNOSIS — N186 End stage renal disease: Secondary | ICD-10-CM | POA: Diagnosis not present

## 2014-06-25 DIAGNOSIS — D631 Anemia in chronic kidney disease: Secondary | ICD-10-CM | POA: Diagnosis not present

## 2014-06-28 DIAGNOSIS — N2581 Secondary hyperparathyroidism of renal origin: Secondary | ICD-10-CM | POA: Diagnosis not present

## 2014-06-28 DIAGNOSIS — D631 Anemia in chronic kidney disease: Secondary | ICD-10-CM | POA: Diagnosis not present

## 2014-06-28 DIAGNOSIS — D509 Iron deficiency anemia, unspecified: Secondary | ICD-10-CM | POA: Diagnosis not present

## 2014-06-28 DIAGNOSIS — N186 End stage renal disease: Secondary | ICD-10-CM | POA: Diagnosis not present

## 2014-06-30 DIAGNOSIS — D509 Iron deficiency anemia, unspecified: Secondary | ICD-10-CM | POA: Diagnosis not present

## 2014-06-30 DIAGNOSIS — N2581 Secondary hyperparathyroidism of renal origin: Secondary | ICD-10-CM | POA: Diagnosis not present

## 2014-06-30 DIAGNOSIS — D631 Anemia in chronic kidney disease: Secondary | ICD-10-CM | POA: Diagnosis not present

## 2014-06-30 DIAGNOSIS — N186 End stage renal disease: Secondary | ICD-10-CM | POA: Diagnosis not present

## 2014-06-30 LAB — PROTIME-INR: INR: 2.4 — AB (ref 0.9–1.1)

## 2014-07-03 DIAGNOSIS — N186 End stage renal disease: Secondary | ICD-10-CM | POA: Diagnosis not present

## 2014-07-03 DIAGNOSIS — D631 Anemia in chronic kidney disease: Secondary | ICD-10-CM | POA: Diagnosis not present

## 2014-07-03 DIAGNOSIS — N2581 Secondary hyperparathyroidism of renal origin: Secondary | ICD-10-CM | POA: Diagnosis not present

## 2014-07-03 DIAGNOSIS — D509 Iron deficiency anemia, unspecified: Secondary | ICD-10-CM | POA: Diagnosis not present

## 2014-07-04 ENCOUNTER — Ambulatory Visit (INDEPENDENT_AMBULATORY_CARE_PROVIDER_SITE_OTHER): Payer: Medicare Other | Admitting: Cardiology

## 2014-07-04 DIAGNOSIS — I76 Septic arterial embolism: Secondary | ICD-10-CM

## 2014-07-04 DIAGNOSIS — Z5181 Encounter for therapeutic drug level monitoring: Secondary | ICD-10-CM

## 2014-07-04 DIAGNOSIS — I2690 Septic pulmonary embolism without acute cor pulmonale: Secondary | ICD-10-CM

## 2014-07-04 DIAGNOSIS — I480 Paroxysmal atrial fibrillation: Secondary | ICD-10-CM

## 2014-07-05 DIAGNOSIS — D509 Iron deficiency anemia, unspecified: Secondary | ICD-10-CM | POA: Diagnosis not present

## 2014-07-05 DIAGNOSIS — N2581 Secondary hyperparathyroidism of renal origin: Secondary | ICD-10-CM | POA: Diagnosis not present

## 2014-07-05 DIAGNOSIS — D631 Anemia in chronic kidney disease: Secondary | ICD-10-CM | POA: Diagnosis not present

## 2014-07-05 DIAGNOSIS — N186 End stage renal disease: Secondary | ICD-10-CM | POA: Diagnosis not present

## 2014-07-07 DIAGNOSIS — N186 End stage renal disease: Secondary | ICD-10-CM | POA: Diagnosis not present

## 2014-07-07 DIAGNOSIS — Z992 Dependence on renal dialysis: Secondary | ICD-10-CM | POA: Diagnosis not present

## 2014-07-07 DIAGNOSIS — D509 Iron deficiency anemia, unspecified: Secondary | ICD-10-CM | POA: Diagnosis not present

## 2014-07-07 DIAGNOSIS — N2581 Secondary hyperparathyroidism of renal origin: Secondary | ICD-10-CM | POA: Diagnosis not present

## 2014-07-07 DIAGNOSIS — D631 Anemia in chronic kidney disease: Secondary | ICD-10-CM | POA: Diagnosis not present

## 2014-07-07 LAB — PROTIME-INR: INR: 1.5 — AB (ref 0.9–1.1)

## 2014-07-10 DIAGNOSIS — D509 Iron deficiency anemia, unspecified: Secondary | ICD-10-CM | POA: Diagnosis not present

## 2014-07-10 DIAGNOSIS — N186 End stage renal disease: Secondary | ICD-10-CM | POA: Diagnosis not present

## 2014-07-10 DIAGNOSIS — N2581 Secondary hyperparathyroidism of renal origin: Secondary | ICD-10-CM | POA: Diagnosis not present

## 2014-07-11 ENCOUNTER — Ambulatory Visit (INDEPENDENT_AMBULATORY_CARE_PROVIDER_SITE_OTHER): Payer: Medicare Other | Admitting: Cardiology

## 2014-07-11 DIAGNOSIS — I2690 Septic pulmonary embolism without acute cor pulmonale: Secondary | ICD-10-CM

## 2014-07-11 DIAGNOSIS — Z5181 Encounter for therapeutic drug level monitoring: Secondary | ICD-10-CM

## 2014-07-11 DIAGNOSIS — I480 Paroxysmal atrial fibrillation: Secondary | ICD-10-CM

## 2014-07-11 DIAGNOSIS — I76 Septic arterial embolism: Secondary | ICD-10-CM

## 2014-07-12 DIAGNOSIS — D509 Iron deficiency anemia, unspecified: Secondary | ICD-10-CM | POA: Diagnosis not present

## 2014-07-12 DIAGNOSIS — N186 End stage renal disease: Secondary | ICD-10-CM | POA: Diagnosis not present

## 2014-07-12 DIAGNOSIS — N2581 Secondary hyperparathyroidism of renal origin: Secondary | ICD-10-CM | POA: Diagnosis not present

## 2014-07-14 DIAGNOSIS — D509 Iron deficiency anemia, unspecified: Secondary | ICD-10-CM | POA: Diagnosis not present

## 2014-07-14 DIAGNOSIS — N2581 Secondary hyperparathyroidism of renal origin: Secondary | ICD-10-CM | POA: Diagnosis not present

## 2014-07-14 DIAGNOSIS — N186 End stage renal disease: Secondary | ICD-10-CM | POA: Diagnosis not present

## 2014-07-16 DIAGNOSIS — N186 End stage renal disease: Secondary | ICD-10-CM | POA: Diagnosis not present

## 2014-07-16 DIAGNOSIS — N2581 Secondary hyperparathyroidism of renal origin: Secondary | ICD-10-CM | POA: Diagnosis not present

## 2014-07-16 DIAGNOSIS — D509 Iron deficiency anemia, unspecified: Secondary | ICD-10-CM | POA: Diagnosis not present

## 2014-07-19 DIAGNOSIS — D509 Iron deficiency anemia, unspecified: Secondary | ICD-10-CM | POA: Diagnosis not present

## 2014-07-19 DIAGNOSIS — N186 End stage renal disease: Secondary | ICD-10-CM | POA: Diagnosis not present

## 2014-07-19 DIAGNOSIS — N2581 Secondary hyperparathyroidism of renal origin: Secondary | ICD-10-CM | POA: Diagnosis not present

## 2014-07-21 DIAGNOSIS — N186 End stage renal disease: Secondary | ICD-10-CM | POA: Diagnosis not present

## 2014-07-21 DIAGNOSIS — D509 Iron deficiency anemia, unspecified: Secondary | ICD-10-CM | POA: Diagnosis not present

## 2014-07-21 DIAGNOSIS — N2581 Secondary hyperparathyroidism of renal origin: Secondary | ICD-10-CM | POA: Diagnosis not present

## 2014-07-22 LAB — POCT INR: INR: 2.27

## 2014-07-23 DIAGNOSIS — D509 Iron deficiency anemia, unspecified: Secondary | ICD-10-CM | POA: Diagnosis not present

## 2014-07-23 DIAGNOSIS — N186 End stage renal disease: Secondary | ICD-10-CM | POA: Diagnosis not present

## 2014-07-23 DIAGNOSIS — N2581 Secondary hyperparathyroidism of renal origin: Secondary | ICD-10-CM | POA: Diagnosis not present

## 2014-07-25 ENCOUNTER — Ambulatory Visit (INDEPENDENT_AMBULATORY_CARE_PROVIDER_SITE_OTHER): Payer: Medicare Other | Admitting: *Deleted

## 2014-07-25 DIAGNOSIS — I480 Paroxysmal atrial fibrillation: Secondary | ICD-10-CM

## 2014-07-25 DIAGNOSIS — Z5181 Encounter for therapeutic drug level monitoring: Secondary | ICD-10-CM

## 2014-07-25 DIAGNOSIS — I2690 Septic pulmonary embolism without acute cor pulmonale: Secondary | ICD-10-CM

## 2014-07-25 DIAGNOSIS — I76 Septic arterial embolism: Secondary | ICD-10-CM

## 2014-07-25 DIAGNOSIS — I82629 Acute embolism and thrombosis of deep veins of unspecified upper extremity: Secondary | ICD-10-CM

## 2014-07-26 DIAGNOSIS — D509 Iron deficiency anemia, unspecified: Secondary | ICD-10-CM | POA: Diagnosis not present

## 2014-07-26 DIAGNOSIS — N2581 Secondary hyperparathyroidism of renal origin: Secondary | ICD-10-CM | POA: Diagnosis not present

## 2014-07-26 DIAGNOSIS — N186 End stage renal disease: Secondary | ICD-10-CM | POA: Diagnosis not present

## 2014-07-28 DIAGNOSIS — N186 End stage renal disease: Secondary | ICD-10-CM | POA: Diagnosis not present

## 2014-07-28 DIAGNOSIS — D509 Iron deficiency anemia, unspecified: Secondary | ICD-10-CM | POA: Diagnosis not present

## 2014-07-28 DIAGNOSIS — N2581 Secondary hyperparathyroidism of renal origin: Secondary | ICD-10-CM | POA: Diagnosis not present

## 2014-07-30 DIAGNOSIS — N186 End stage renal disease: Secondary | ICD-10-CM | POA: Diagnosis not present

## 2014-07-30 DIAGNOSIS — D509 Iron deficiency anemia, unspecified: Secondary | ICD-10-CM | POA: Diagnosis not present

## 2014-07-30 DIAGNOSIS — N2581 Secondary hyperparathyroidism of renal origin: Secondary | ICD-10-CM | POA: Diagnosis not present

## 2014-08-02 ENCOUNTER — Ambulatory Visit (HOSPITAL_COMMUNITY)
Admission: RE | Admit: 2014-08-02 | Discharge: 2014-08-02 | Disposition: A | Discharge status: Home / Self Care | Payer: Medicare Other | Source: Ambulatory Visit | Attending: Vascular Surgery | Admitting: Vascular Surgery

## 2014-08-02 ENCOUNTER — Ambulatory Visit (INDEPENDENT_AMBULATORY_CARE_PROVIDER_SITE_OTHER)
Admission: RE | Admit: 2014-08-02 | Discharge: 2014-08-02 | Disposition: A | Discharge status: Home / Self Care | Payer: Medicare Other | Source: Ambulatory Visit | Attending: Vascular Surgery | Admitting: Vascular Surgery

## 2014-08-02 DIAGNOSIS — Z9889 Other specified postprocedural states: Secondary | ICD-10-CM | POA: Insufficient documentation

## 2014-08-02 DIAGNOSIS — I739 Peripheral vascular disease, unspecified: Secondary | ICD-10-CM | POA: Insufficient documentation

## 2014-08-02 DIAGNOSIS — Z87891 Personal history of nicotine dependence: Secondary | ICD-10-CM | POA: Insufficient documentation

## 2014-08-02 DIAGNOSIS — I10 Essential (primary) hypertension: Secondary | ICD-10-CM | POA: Insufficient documentation

## 2014-08-02 DIAGNOSIS — Z48812 Encounter for surgical aftercare following surgery on the circulatory system: Secondary | ICD-10-CM | POA: Diagnosis not present

## 2014-08-02 DIAGNOSIS — N186 End stage renal disease: Secondary | ICD-10-CM | POA: Diagnosis not present

## 2014-08-02 DIAGNOSIS — D509 Iron deficiency anemia, unspecified: Secondary | ICD-10-CM | POA: Diagnosis not present

## 2014-08-02 DIAGNOSIS — N2581 Secondary hyperparathyroidism of renal origin: Secondary | ICD-10-CM | POA: Diagnosis not present

## 2014-08-04 DIAGNOSIS — N2581 Secondary hyperparathyroidism of renal origin: Secondary | ICD-10-CM | POA: Diagnosis not present

## 2014-08-04 DIAGNOSIS — E1129 Type 2 diabetes mellitus with other diabetic kidney complication: Secondary | ICD-10-CM | POA: Diagnosis not present

## 2014-08-04 DIAGNOSIS — N186 End stage renal disease: Secondary | ICD-10-CM | POA: Diagnosis not present

## 2014-08-04 DIAGNOSIS — D509 Iron deficiency anemia, unspecified: Secondary | ICD-10-CM | POA: Diagnosis not present

## 2014-08-06 DIAGNOSIS — N186 End stage renal disease: Secondary | ICD-10-CM | POA: Diagnosis not present

## 2014-08-06 DIAGNOSIS — N2581 Secondary hyperparathyroidism of renal origin: Secondary | ICD-10-CM | POA: Diagnosis not present

## 2014-08-06 DIAGNOSIS — D509 Iron deficiency anemia, unspecified: Secondary | ICD-10-CM | POA: Diagnosis not present

## 2014-08-07 DIAGNOSIS — N186 End stage renal disease: Secondary | ICD-10-CM | POA: Diagnosis not present

## 2014-08-07 DIAGNOSIS — Z992 Dependence on renal dialysis: Secondary | ICD-10-CM | POA: Diagnosis not present

## 2014-08-08 ENCOUNTER — Encounter: Payer: Self-pay | Admitting: Vascular Surgery

## 2014-08-09 ENCOUNTER — Encounter: Payer: Self-pay | Admitting: Vascular Surgery

## 2014-08-09 ENCOUNTER — Ambulatory Visit (INDEPENDENT_AMBULATORY_CARE_PROVIDER_SITE_OTHER): Payer: Medicare Other | Admitting: Vascular Surgery

## 2014-08-09 VITALS — BP 124/89 | HR 96 | Temp 98.2°F | Resp 16 | Ht 71.0 in | Wt 229.2 lb

## 2014-08-09 DIAGNOSIS — N2581 Secondary hyperparathyroidism of renal origin: Secondary | ICD-10-CM | POA: Diagnosis not present

## 2014-08-09 DIAGNOSIS — N186 End stage renal disease: Secondary | ICD-10-CM | POA: Diagnosis not present

## 2014-08-09 DIAGNOSIS — I739 Peripheral vascular disease, unspecified: Secondary | ICD-10-CM | POA: Diagnosis not present

## 2014-08-09 DIAGNOSIS — D509 Iron deficiency anemia, unspecified: Secondary | ICD-10-CM | POA: Diagnosis not present

## 2014-08-09 NOTE — Progress Notes (Signed)
Patient is today for followup of his left above-knee to below-knee popliteal bypass. He had probable mycotic embolus from heart valve. He recovered from the surgery and underwent an open repair. He looks quite good today. Does have a long history of greater than 11 years of hemodialysis for renal failure. Has no symptoms referable to bypass.  Past Medical History  Diagnosis Date  . Hypertension   . Peripheral vascular disease   . Pneumonia 2014  . ESRD (end stage renal disease) on dialysis     [place],Dialysis- T,Th,S  . Acute on chronic diastolic heart failure 123XX123  . Severe mitral regurgitation 03/23/2014    by TEE  . Positive blood culture 11/27/2013    MSSA  . Positive blood culture 02/24/2014    MSSA  . DVT of upper extremity (deep vein thrombosis) 03/04/2014    Right arm  . Knee pain, left 03/21/2014  . Acute combined systolic and diastolic congestive heart failure 03/21/2014  . Chronic diastolic congestive heart failure   . 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 Septic embolism to left lower extremity 03/25/2014  . 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 The aneurysm measures 1.4 x 1.1 cm which is  significantly larger than the 4.5 mm parent vessel.   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 S/P minimally invasive mitral valve repair 04/05/2014    Complex valvuloplasty including autologous pericardial patch repair of large perforation of posterior leaflet due to endocarditis with 28 mm Sorin Memo 3D ring annuloplasty via right mini thoracotomy approach    History  Substance Use Topics  . Smoking status: Never Smoker   . Smokeless tobacco: Never Used  . Alcohol Use: Yes     Comment: 03/23/2014 "glass or 2 of gin twice/month"    Family History   Problem Relation Age of Onset  . Diabetes Mother   . Hypertension Mother   . Hypertension Father   . Hypertension Sister   . Hypertension Brother     No Known Allergies   Current outpatient prescriptions:  .  amiodarone (PACERONE) 200 MG tablet, Take 200 mg by mouth daily., Disp: , Rfl:  .  carvedilol (COREG) 6.25 MG tablet, Take 1 tablet (6.25 mg total) by mouth 2 (two) times daily., Disp: 60 tablet, Rfl: 3 .  cinacalcet (SENSIPAR) 60 MG tablet, Take 60 mg by mouth daily., Disp: , Rfl:  .  lisinopril (PRINIVIL,ZESTRIL) 20 MG tablet, Take 20 mg by mouth daily., Disp: , Rfl:  .  sevelamer carbonate (RENVELA) 800 MG tablet, Take 2 tablets (1,600 mg total) by mouth 3 (three) times daily with meals., Disp: 180 tablet, Rfl: 1 .  warfarin (COUMADIN) 5 MG tablet, 5 mg po daily or as directed, Disp: 30 tablet, Rfl: 1 .  darbepoetin (ARANESP) 100 MCG/0.5ML SOLN injection, Inject 0.5 mLs (100 mcg total) into the vein every Saturday with hemodialysis. (Patient not taking: Reported on 08/09/2014), Disp: 4.2 mL, Rfl:   BP 124/89 mmHg  Pulse 96  Temp(Src) 98.2 F (36.8 C) (Oral)  Resp 16  Ht 5\' 11"  (1.803 m)  Wt 229 lb 3.2 oz (103.964 kg)  BMI 31.98 kg/m2  SpO2 100%  Body mass index is 31.98 kg/(m^2).        Physical exam well: Show no acute distress Left forearm  radiocephalic fistula with excellent thrill Grossly intact neurologically Respirations are equal nonlabored Well-healed above-knee and below-knee medial incisions on the left 2 posterior dorsalis pedis pulses on the left and right  [place] noninvasive studies were reviewed with patient. This reveals normal ankle arm index bilateral. He did undergo duplex imaging of his vein bypass showed no evidence of stenosis  Impression and plan: Stable followup after her above-knee to below-knee vein graft bypass left leg. We'll continue his usual activities. We'll see him again in 3 months for continued followup. I did discuss  symptoms of occlusive disease with the patient he knows to report to [place] should this occur

## 2014-08-10 ENCOUNTER — Other Ambulatory Visit: Payer: Self-pay | Admitting: *Deleted

## 2014-08-10 DIAGNOSIS — I739 Peripheral vascular disease, unspecified: Secondary | ICD-10-CM

## 2014-08-11 DIAGNOSIS — D509 Iron deficiency anemia, unspecified: Secondary | ICD-10-CM | POA: Diagnosis not present

## 2014-08-11 DIAGNOSIS — N2581 Secondary hyperparathyroidism of renal origin: Secondary | ICD-10-CM | POA: Diagnosis not present

## 2014-08-11 DIAGNOSIS — N186 End stage renal disease: Secondary | ICD-10-CM | POA: Diagnosis not present

## 2014-08-12 ENCOUNTER — Ambulatory Visit (INDEPENDENT_AMBULATORY_CARE_PROVIDER_SITE_OTHER): Payer: Medicare Other | Admitting: Interventional Cardiology

## 2014-08-12 DIAGNOSIS — I480 Paroxysmal atrial fibrillation: Secondary | ICD-10-CM

## 2014-08-12 DIAGNOSIS — I2690 Septic pulmonary embolism without acute cor pulmonale: Secondary | ICD-10-CM

## 2014-08-12 DIAGNOSIS — Z5181 Encounter for therapeutic drug level monitoring: Secondary | ICD-10-CM

## 2014-08-12 DIAGNOSIS — I76 Septic arterial embolism: Secondary | ICD-10-CM

## 2014-08-12 LAB — PROTIME-INR: INR: 1.5 — AB (ref <=1.1)

## 2014-08-12 MED ORDER — WARFARIN SODIUM 5 MG PO TABS: ORAL_TABLET | ORAL | Status: DC

## 2014-08-13 DIAGNOSIS — N186 End stage renal disease: Secondary | ICD-10-CM | POA: Diagnosis not present

## 2014-08-13 DIAGNOSIS — N2581 Secondary hyperparathyroidism of renal origin: Secondary | ICD-10-CM | POA: Diagnosis not present

## 2014-08-13 DIAGNOSIS — D509 Iron deficiency anemia, unspecified: Secondary | ICD-10-CM | POA: Diagnosis not present

## 2014-08-16 DIAGNOSIS — N2581 Secondary hyperparathyroidism of renal origin: Secondary | ICD-10-CM | POA: Diagnosis not present

## 2014-08-16 DIAGNOSIS — D509 Iron deficiency anemia, unspecified: Secondary | ICD-10-CM | POA: Diagnosis not present

## 2014-08-16 DIAGNOSIS — N186 End stage renal disease: Secondary | ICD-10-CM | POA: Diagnosis not present

## 2014-08-18 DIAGNOSIS — N186 End stage renal disease: Secondary | ICD-10-CM | POA: Diagnosis not present

## 2014-08-18 DIAGNOSIS — D509 Iron deficiency anemia, unspecified: Secondary | ICD-10-CM | POA: Diagnosis not present

## 2014-08-18 DIAGNOSIS — N2581 Secondary hyperparathyroidism of renal origin: Secondary | ICD-10-CM | POA: Diagnosis not present

## 2014-08-20 DIAGNOSIS — N2581 Secondary hyperparathyroidism of renal origin: Secondary | ICD-10-CM | POA: Diagnosis not present

## 2014-08-20 DIAGNOSIS — D509 Iron deficiency anemia, unspecified: Secondary | ICD-10-CM | POA: Diagnosis not present

## 2014-08-20 DIAGNOSIS — N186 End stage renal disease: Secondary | ICD-10-CM | POA: Diagnosis not present

## 2014-08-23 DIAGNOSIS — N2581 Secondary hyperparathyroidism of renal origin: Secondary | ICD-10-CM | POA: Diagnosis not present

## 2014-08-23 DIAGNOSIS — N186 End stage renal disease: Secondary | ICD-10-CM | POA: Diagnosis not present

## 2014-08-23 DIAGNOSIS — D509 Iron deficiency anemia, unspecified: Secondary | ICD-10-CM | POA: Diagnosis not present

## 2014-08-25 DIAGNOSIS — D509 Iron deficiency anemia, unspecified: Secondary | ICD-10-CM | POA: Diagnosis not present

## 2014-08-25 DIAGNOSIS — Z5181 Encounter for therapeutic drug level monitoring: Secondary | ICD-10-CM | POA: Diagnosis not present

## 2014-08-25 DIAGNOSIS — N186 End stage renal disease: Secondary | ICD-10-CM | POA: Diagnosis not present

## 2014-08-25 DIAGNOSIS — N2581 Secondary hyperparathyroidism of renal origin: Secondary | ICD-10-CM | POA: Diagnosis not present

## 2014-08-25 DIAGNOSIS — Z7901 Long term (current) use of anticoagulants: Secondary | ICD-10-CM | POA: Diagnosis not present

## 2014-08-25 LAB — PROTIME-INR: INR: 1.4 — AB (ref 0.9–1.1)

## 2014-08-26 ENCOUNTER — Ambulatory Visit (INDEPENDENT_AMBULATORY_CARE_PROVIDER_SITE_OTHER): Payer: Medicare Other | Admitting: Cardiology

## 2014-08-26 DIAGNOSIS — I2690 Septic pulmonary embolism without acute cor pulmonale: Secondary | ICD-10-CM

## 2014-08-26 DIAGNOSIS — I480 Paroxysmal atrial fibrillation: Secondary | ICD-10-CM

## 2014-08-26 DIAGNOSIS — I76 Septic arterial embolism: Secondary | ICD-10-CM

## 2014-08-26 DIAGNOSIS — Z5181 Encounter for therapeutic drug level monitoring: Secondary | ICD-10-CM

## 2014-08-27 DIAGNOSIS — N2581 Secondary hyperparathyroidism of renal origin: Secondary | ICD-10-CM | POA: Diagnosis not present

## 2014-08-27 DIAGNOSIS — D509 Iron deficiency anemia, unspecified: Secondary | ICD-10-CM | POA: Diagnosis not present

## 2014-08-27 DIAGNOSIS — N186 End stage renal disease: Secondary | ICD-10-CM | POA: Diagnosis not present

## 2014-08-30 DIAGNOSIS — D509 Iron deficiency anemia, unspecified: Secondary | ICD-10-CM | POA: Diagnosis not present

## 2014-08-30 DIAGNOSIS — N186 End stage renal disease: Secondary | ICD-10-CM | POA: Diagnosis not present

## 2014-08-30 DIAGNOSIS — N2581 Secondary hyperparathyroidism of renal origin: Secondary | ICD-10-CM | POA: Diagnosis not present

## 2014-09-01 DIAGNOSIS — N186 End stage renal disease: Secondary | ICD-10-CM | POA: Diagnosis not present

## 2014-09-01 DIAGNOSIS — D509 Iron deficiency anemia, unspecified: Secondary | ICD-10-CM | POA: Diagnosis not present

## 2014-09-01 DIAGNOSIS — N2581 Secondary hyperparathyroidism of renal origin: Secondary | ICD-10-CM | POA: Diagnosis not present

## 2014-09-03 DIAGNOSIS — N2581 Secondary hyperparathyroidism of renal origin: Secondary | ICD-10-CM | POA: Diagnosis not present

## 2014-09-03 DIAGNOSIS — N186 End stage renal disease: Secondary | ICD-10-CM | POA: Diagnosis not present

## 2014-09-03 DIAGNOSIS — D509 Iron deficiency anemia, unspecified: Secondary | ICD-10-CM | POA: Diagnosis not present

## 2014-09-05 DIAGNOSIS — Z992 Dependence on renal dialysis: Secondary | ICD-10-CM | POA: Diagnosis not present

## 2014-09-05 DIAGNOSIS — N186 End stage renal disease: Secondary | ICD-10-CM | POA: Diagnosis not present

## 2014-09-06 DIAGNOSIS — D509 Iron deficiency anemia, unspecified: Secondary | ICD-10-CM | POA: Diagnosis not present

## 2014-09-06 DIAGNOSIS — N186 End stage renal disease: Secondary | ICD-10-CM | POA: Diagnosis not present

## 2014-09-06 DIAGNOSIS — N2581 Secondary hyperparathyroidism of renal origin: Secondary | ICD-10-CM | POA: Diagnosis not present

## 2014-09-08 DIAGNOSIS — N2581 Secondary hyperparathyroidism of renal origin: Secondary | ICD-10-CM | POA: Diagnosis not present

## 2014-09-08 DIAGNOSIS — I480 Paroxysmal atrial fibrillation: Secondary | ICD-10-CM | POA: Diagnosis not present

## 2014-09-08 DIAGNOSIS — N186 End stage renal disease: Secondary | ICD-10-CM | POA: Diagnosis not present

## 2014-09-08 DIAGNOSIS — Z7901 Long term (current) use of anticoagulants: Secondary | ICD-10-CM | POA: Diagnosis not present

## 2014-09-08 DIAGNOSIS — Z5181 Encounter for therapeutic drug level monitoring: Secondary | ICD-10-CM | POA: Diagnosis not present

## 2014-09-08 DIAGNOSIS — D509 Iron deficiency anemia, unspecified: Secondary | ICD-10-CM | POA: Diagnosis not present

## 2014-09-08 LAB — PROTIME-INR: INR: 2 — AB (ref 0.9–1.1)

## 2014-09-10 DIAGNOSIS — N2581 Secondary hyperparathyroidism of renal origin: Secondary | ICD-10-CM | POA: Diagnosis not present

## 2014-09-10 DIAGNOSIS — N186 End stage renal disease: Secondary | ICD-10-CM | POA: Diagnosis not present

## 2014-09-10 DIAGNOSIS — D509 Iron deficiency anemia, unspecified: Secondary | ICD-10-CM | POA: Diagnosis not present

## 2014-09-12 ENCOUNTER — Ambulatory Visit (INDEPENDENT_AMBULATORY_CARE_PROVIDER_SITE_OTHER): Payer: Medicare Other | Admitting: Internal Medicine

## 2014-09-12 DIAGNOSIS — Z5181 Encounter for therapeutic drug level monitoring: Secondary | ICD-10-CM

## 2014-09-12 DIAGNOSIS — I2690 Septic pulmonary embolism without acute cor pulmonale: Secondary | ICD-10-CM

## 2014-09-12 DIAGNOSIS — I76 Septic arterial embolism: Secondary | ICD-10-CM

## 2014-09-12 DIAGNOSIS — I480 Paroxysmal atrial fibrillation: Secondary | ICD-10-CM

## 2014-09-13 ENCOUNTER — Telehealth: Payer: Self-pay | Admitting: *Deleted

## 2014-09-13 DIAGNOSIS — N2581 Secondary hyperparathyroidism of renal origin: Secondary | ICD-10-CM | POA: Diagnosis not present

## 2014-09-13 DIAGNOSIS — N186 End stage renal disease: Secondary | ICD-10-CM | POA: Diagnosis not present

## 2014-09-13 DIAGNOSIS — D509 Iron deficiency anemia, unspecified: Secondary | ICD-10-CM | POA: Diagnosis not present

## 2014-09-13 NOTE — Telephone Encounter (Signed)
May change order for PT/INR from March 15,2016 to March 17,2016 as they do not do labs on Tuesdays and he is there for dialysis on March 17th

## 2014-09-15 DIAGNOSIS — N2581 Secondary hyperparathyroidism of renal origin: Secondary | ICD-10-CM | POA: Diagnosis not present

## 2014-09-15 DIAGNOSIS — N186 End stage renal disease: Secondary | ICD-10-CM | POA: Diagnosis not present

## 2014-09-15 DIAGNOSIS — D509 Iron deficiency anemia, unspecified: Secondary | ICD-10-CM | POA: Diagnosis not present

## 2014-09-17 DIAGNOSIS — D509 Iron deficiency anemia, unspecified: Secondary | ICD-10-CM | POA: Diagnosis not present

## 2014-09-17 DIAGNOSIS — N2581 Secondary hyperparathyroidism of renal origin: Secondary | ICD-10-CM | POA: Diagnosis not present

## 2014-09-17 DIAGNOSIS — N186 End stage renal disease: Secondary | ICD-10-CM | POA: Diagnosis not present

## 2014-09-20 DIAGNOSIS — N186 End stage renal disease: Secondary | ICD-10-CM | POA: Diagnosis not present

## 2014-09-20 DIAGNOSIS — N2581 Secondary hyperparathyroidism of renal origin: Secondary | ICD-10-CM | POA: Diagnosis not present

## 2014-09-20 DIAGNOSIS — D509 Iron deficiency anemia, unspecified: Secondary | ICD-10-CM | POA: Diagnosis not present

## 2014-09-22 DIAGNOSIS — I480 Paroxysmal atrial fibrillation: Secondary | ICD-10-CM | POA: Diagnosis not present

## 2014-09-22 DIAGNOSIS — N186 End stage renal disease: Secondary | ICD-10-CM | POA: Diagnosis not present

## 2014-09-22 DIAGNOSIS — N2581 Secondary hyperparathyroidism of renal origin: Secondary | ICD-10-CM | POA: Diagnosis not present

## 2014-09-22 DIAGNOSIS — Z7901 Long term (current) use of anticoagulants: Secondary | ICD-10-CM | POA: Diagnosis not present

## 2014-09-22 DIAGNOSIS — Z5181 Encounter for therapeutic drug level monitoring: Secondary | ICD-10-CM | POA: Diagnosis not present

## 2014-09-22 DIAGNOSIS — D509 Iron deficiency anemia, unspecified: Secondary | ICD-10-CM | POA: Diagnosis not present

## 2014-09-22 LAB — PROTIME-INR: INR: 1.2 — AB (ref 0.9–1.1)

## 2014-09-24 DIAGNOSIS — D509 Iron deficiency anemia, unspecified: Secondary | ICD-10-CM | POA: Diagnosis not present

## 2014-09-24 DIAGNOSIS — N2581 Secondary hyperparathyroidism of renal origin: Secondary | ICD-10-CM | POA: Diagnosis not present

## 2014-09-24 DIAGNOSIS — N186 End stage renal disease: Secondary | ICD-10-CM | POA: Diagnosis not present

## 2014-09-26 ENCOUNTER — Ambulatory Visit (INDEPENDENT_AMBULATORY_CARE_PROVIDER_SITE_OTHER): Payer: Medicare Other | Admitting: Interventional Cardiology

## 2014-09-26 DIAGNOSIS — Z5181 Encounter for therapeutic drug level monitoring: Secondary | ICD-10-CM

## 2014-09-26 DIAGNOSIS — I480 Paroxysmal atrial fibrillation: Secondary | ICD-10-CM

## 2014-09-26 DIAGNOSIS — I2690 Septic pulmonary embolism without acute cor pulmonale: Secondary | ICD-10-CM

## 2014-09-26 DIAGNOSIS — I76 Septic arterial embolism: Secondary | ICD-10-CM

## 2014-09-26 DIAGNOSIS — I82629 Acute embolism and thrombosis of deep veins of unspecified upper extremity: Secondary | ICD-10-CM

## 2014-09-27 DIAGNOSIS — D509 Iron deficiency anemia, unspecified: Secondary | ICD-10-CM | POA: Diagnosis not present

## 2014-09-27 DIAGNOSIS — N186 End stage renal disease: Secondary | ICD-10-CM | POA: Diagnosis not present

## 2014-09-27 DIAGNOSIS — N2581 Secondary hyperparathyroidism of renal origin: Secondary | ICD-10-CM | POA: Diagnosis not present

## 2014-09-29 DIAGNOSIS — R001 Bradycardia, unspecified: Secondary | ICD-10-CM | POA: Diagnosis not present

## 2014-09-29 DIAGNOSIS — D509 Iron deficiency anemia, unspecified: Secondary | ICD-10-CM | POA: Diagnosis not present

## 2014-09-29 DIAGNOSIS — N2581 Secondary hyperparathyroidism of renal origin: Secondary | ICD-10-CM | POA: Diagnosis not present

## 2014-09-29 DIAGNOSIS — N186 End stage renal disease: Secondary | ICD-10-CM | POA: Diagnosis not present

## 2014-09-29 LAB — PROTIME-INR: INR: 1.3 — AB (ref 0.9–1.1)

## 2014-10-01 DIAGNOSIS — N2581 Secondary hyperparathyroidism of renal origin: Secondary | ICD-10-CM | POA: Diagnosis not present

## 2014-10-01 DIAGNOSIS — N186 End stage renal disease: Secondary | ICD-10-CM | POA: Diagnosis not present

## 2014-10-01 DIAGNOSIS — D509 Iron deficiency anemia, unspecified: Secondary | ICD-10-CM | POA: Diagnosis not present

## 2014-10-03 ENCOUNTER — Ambulatory Visit (INDEPENDENT_AMBULATORY_CARE_PROVIDER_SITE_OTHER): Payer: Medicare Other | Admitting: Internal Medicine

## 2014-10-03 DIAGNOSIS — I2690 Septic pulmonary embolism without acute cor pulmonale: Secondary | ICD-10-CM

## 2014-10-03 DIAGNOSIS — Z5181 Encounter for therapeutic drug level monitoring: Secondary | ICD-10-CM

## 2014-10-03 DIAGNOSIS — I76 Septic arterial embolism: Secondary | ICD-10-CM

## 2014-10-03 DIAGNOSIS — I480 Paroxysmal atrial fibrillation: Secondary | ICD-10-CM

## 2014-10-04 DIAGNOSIS — D509 Iron deficiency anemia, unspecified: Secondary | ICD-10-CM | POA: Diagnosis not present

## 2014-10-04 DIAGNOSIS — N2581 Secondary hyperparathyroidism of renal origin: Secondary | ICD-10-CM | POA: Diagnosis not present

## 2014-10-04 DIAGNOSIS — N186 End stage renal disease: Secondary | ICD-10-CM | POA: Diagnosis not present

## 2014-10-06 DIAGNOSIS — D509 Iron deficiency anemia, unspecified: Secondary | ICD-10-CM | POA: Diagnosis not present

## 2014-10-06 DIAGNOSIS — N186 End stage renal disease: Secondary | ICD-10-CM | POA: Diagnosis not present

## 2014-10-06 DIAGNOSIS — N2581 Secondary hyperparathyroidism of renal origin: Secondary | ICD-10-CM | POA: Diagnosis not present

## 2014-10-06 DIAGNOSIS — Z992 Dependence on renal dialysis: Secondary | ICD-10-CM | POA: Diagnosis not present

## 2014-10-06 DIAGNOSIS — I120 Hypertensive chronic kidney disease with stage 5 chronic kidney disease or end stage renal disease: Secondary | ICD-10-CM | POA: Diagnosis not present

## 2014-10-07 DIAGNOSIS — I871 Compression of vein: Secondary | ICD-10-CM | POA: Diagnosis not present

## 2014-10-07 DIAGNOSIS — N186 End stage renal disease: Secondary | ICD-10-CM | POA: Diagnosis not present

## 2014-10-07 DIAGNOSIS — Z992 Dependence on renal dialysis: Secondary | ICD-10-CM | POA: Diagnosis not present

## 2014-10-07 DIAGNOSIS — T82858D Stenosis of vascular prosthetic devices, implants and grafts, subsequent encounter: Secondary | ICD-10-CM | POA: Diagnosis not present

## 2014-10-08 DIAGNOSIS — D631 Anemia in chronic kidney disease: Secondary | ICD-10-CM | POA: Diagnosis not present

## 2014-10-08 DIAGNOSIS — N186 End stage renal disease: Secondary | ICD-10-CM | POA: Diagnosis not present

## 2014-10-08 DIAGNOSIS — D509 Iron deficiency anemia, unspecified: Secondary | ICD-10-CM | POA: Diagnosis not present

## 2014-10-08 DIAGNOSIS — N2581 Secondary hyperparathyroidism of renal origin: Secondary | ICD-10-CM | POA: Diagnosis not present

## 2014-10-11 DIAGNOSIS — N186 End stage renal disease: Secondary | ICD-10-CM | POA: Diagnosis not present

## 2014-10-11 DIAGNOSIS — N2581 Secondary hyperparathyroidism of renal origin: Secondary | ICD-10-CM | POA: Diagnosis not present

## 2014-10-11 DIAGNOSIS — D509 Iron deficiency anemia, unspecified: Secondary | ICD-10-CM | POA: Diagnosis not present

## 2014-10-11 DIAGNOSIS — D631 Anemia in chronic kidney disease: Secondary | ICD-10-CM | POA: Diagnosis not present

## 2014-10-13 DIAGNOSIS — D631 Anemia in chronic kidney disease: Secondary | ICD-10-CM | POA: Diagnosis not present

## 2014-10-13 DIAGNOSIS — I480 Paroxysmal atrial fibrillation: Secondary | ICD-10-CM | POA: Diagnosis not present

## 2014-10-13 DIAGNOSIS — D509 Iron deficiency anemia, unspecified: Secondary | ICD-10-CM | POA: Diagnosis not present

## 2014-10-13 DIAGNOSIS — N2581 Secondary hyperparathyroidism of renal origin: Secondary | ICD-10-CM | POA: Diagnosis not present

## 2014-10-13 DIAGNOSIS — N186 End stage renal disease: Secondary | ICD-10-CM | POA: Diagnosis not present

## 2014-10-13 LAB — PROTIME-INR: INR: 2.2 — AB (ref 0.9–1.1)

## 2014-10-15 DIAGNOSIS — N186 End stage renal disease: Secondary | ICD-10-CM | POA: Diagnosis not present

## 2014-10-15 DIAGNOSIS — N2581 Secondary hyperparathyroidism of renal origin: Secondary | ICD-10-CM | POA: Diagnosis not present

## 2014-10-15 DIAGNOSIS — D631 Anemia in chronic kidney disease: Secondary | ICD-10-CM | POA: Diagnosis not present

## 2014-10-15 DIAGNOSIS — D509 Iron deficiency anemia, unspecified: Secondary | ICD-10-CM | POA: Diagnosis not present

## 2014-10-17 ENCOUNTER — Ambulatory Visit (INDEPENDENT_AMBULATORY_CARE_PROVIDER_SITE_OTHER): Payer: Medicare Other | Admitting: Internal Medicine

## 2014-10-17 DIAGNOSIS — I480 Paroxysmal atrial fibrillation: Secondary | ICD-10-CM

## 2014-10-17 DIAGNOSIS — I2690 Septic pulmonary embolism without acute cor pulmonale: Secondary | ICD-10-CM

## 2014-10-17 DIAGNOSIS — I76 Septic arterial embolism: Secondary | ICD-10-CM

## 2014-10-17 DIAGNOSIS — Z5181 Encounter for therapeutic drug level monitoring: Secondary | ICD-10-CM

## 2014-10-18 DIAGNOSIS — N186 End stage renal disease: Secondary | ICD-10-CM | POA: Diagnosis not present

## 2014-10-18 DIAGNOSIS — N2581 Secondary hyperparathyroidism of renal origin: Secondary | ICD-10-CM | POA: Diagnosis not present

## 2014-10-18 DIAGNOSIS — D509 Iron deficiency anemia, unspecified: Secondary | ICD-10-CM | POA: Diagnosis not present

## 2014-10-18 DIAGNOSIS — D631 Anemia in chronic kidney disease: Secondary | ICD-10-CM | POA: Diagnosis not present

## 2014-10-20 DIAGNOSIS — D509 Iron deficiency anemia, unspecified: Secondary | ICD-10-CM | POA: Diagnosis not present

## 2014-10-20 DIAGNOSIS — D631 Anemia in chronic kidney disease: Secondary | ICD-10-CM | POA: Diagnosis not present

## 2014-10-20 DIAGNOSIS — N186 End stage renal disease: Secondary | ICD-10-CM | POA: Diagnosis not present

## 2014-10-20 DIAGNOSIS — N2581 Secondary hyperparathyroidism of renal origin: Secondary | ICD-10-CM | POA: Diagnosis not present

## 2014-10-22 DIAGNOSIS — N186 End stage renal disease: Secondary | ICD-10-CM | POA: Diagnosis not present

## 2014-10-22 DIAGNOSIS — N2581 Secondary hyperparathyroidism of renal origin: Secondary | ICD-10-CM | POA: Diagnosis not present

## 2014-10-22 DIAGNOSIS — D509 Iron deficiency anemia, unspecified: Secondary | ICD-10-CM | POA: Diagnosis not present

## 2014-10-22 DIAGNOSIS — D631 Anemia in chronic kidney disease: Secondary | ICD-10-CM | POA: Diagnosis not present

## 2014-10-25 DIAGNOSIS — N186 End stage renal disease: Secondary | ICD-10-CM | POA: Diagnosis not present

## 2014-10-25 DIAGNOSIS — D631 Anemia in chronic kidney disease: Secondary | ICD-10-CM | POA: Diagnosis not present

## 2014-10-25 DIAGNOSIS — D509 Iron deficiency anemia, unspecified: Secondary | ICD-10-CM | POA: Diagnosis not present

## 2014-10-25 DIAGNOSIS — N2581 Secondary hyperparathyroidism of renal origin: Secondary | ICD-10-CM | POA: Diagnosis not present

## 2014-10-27 DIAGNOSIS — N2581 Secondary hyperparathyroidism of renal origin: Secondary | ICD-10-CM | POA: Diagnosis not present

## 2014-10-27 DIAGNOSIS — N186 End stage renal disease: Secondary | ICD-10-CM | POA: Diagnosis not present

## 2014-10-27 DIAGNOSIS — D631 Anemia in chronic kidney disease: Secondary | ICD-10-CM | POA: Diagnosis not present

## 2014-10-27 DIAGNOSIS — Z5181 Encounter for therapeutic drug level monitoring: Secondary | ICD-10-CM | POA: Diagnosis not present

## 2014-10-27 DIAGNOSIS — D509 Iron deficiency anemia, unspecified: Secondary | ICD-10-CM | POA: Diagnosis not present

## 2014-10-27 LAB — PROTIME-INR: INR: 1.3 — AB (ref <=1.1)

## 2014-10-29 DIAGNOSIS — D509 Iron deficiency anemia, unspecified: Secondary | ICD-10-CM | POA: Diagnosis not present

## 2014-10-29 DIAGNOSIS — N2581 Secondary hyperparathyroidism of renal origin: Secondary | ICD-10-CM | POA: Diagnosis not present

## 2014-10-29 DIAGNOSIS — D631 Anemia in chronic kidney disease: Secondary | ICD-10-CM | POA: Diagnosis not present

## 2014-10-29 DIAGNOSIS — N186 End stage renal disease: Secondary | ICD-10-CM | POA: Diagnosis not present

## 2014-11-01 ENCOUNTER — Ambulatory Visit (INDEPENDENT_AMBULATORY_CARE_PROVIDER_SITE_OTHER): Payer: Medicare Other | Admitting: Cardiovascular Disease

## 2014-11-01 DIAGNOSIS — I76 Septic arterial embolism: Secondary | ICD-10-CM

## 2014-11-01 DIAGNOSIS — Z5181 Encounter for therapeutic drug level monitoring: Secondary | ICD-10-CM

## 2014-11-01 DIAGNOSIS — I480 Paroxysmal atrial fibrillation: Secondary | ICD-10-CM

## 2014-11-01 DIAGNOSIS — N186 End stage renal disease: Secondary | ICD-10-CM | POA: Diagnosis not present

## 2014-11-01 DIAGNOSIS — D631 Anemia in chronic kidney disease: Secondary | ICD-10-CM | POA: Diagnosis not present

## 2014-11-01 DIAGNOSIS — I2690 Septic pulmonary embolism without acute cor pulmonale: Secondary | ICD-10-CM

## 2014-11-01 DIAGNOSIS — D509 Iron deficiency anemia, unspecified: Secondary | ICD-10-CM | POA: Diagnosis not present

## 2014-11-01 DIAGNOSIS — N2581 Secondary hyperparathyroidism of renal origin: Secondary | ICD-10-CM | POA: Diagnosis not present

## 2014-11-03 DIAGNOSIS — E1129 Type 2 diabetes mellitus with other diabetic kidney complication: Secondary | ICD-10-CM | POA: Diagnosis not present

## 2014-11-03 DIAGNOSIS — N2581 Secondary hyperparathyroidism of renal origin: Secondary | ICD-10-CM | POA: Diagnosis not present

## 2014-11-03 DIAGNOSIS — R001 Bradycardia, unspecified: Secondary | ICD-10-CM | POA: Diagnosis not present

## 2014-11-03 DIAGNOSIS — D631 Anemia in chronic kidney disease: Secondary | ICD-10-CM | POA: Diagnosis not present

## 2014-11-03 DIAGNOSIS — D509 Iron deficiency anemia, unspecified: Secondary | ICD-10-CM | POA: Diagnosis not present

## 2014-11-03 DIAGNOSIS — N186 End stage renal disease: Secondary | ICD-10-CM | POA: Diagnosis not present

## 2014-11-05 DIAGNOSIS — D509 Iron deficiency anemia, unspecified: Secondary | ICD-10-CM | POA: Diagnosis not present

## 2014-11-05 DIAGNOSIS — I120 Hypertensive chronic kidney disease with stage 5 chronic kidney disease or end stage renal disease: Secondary | ICD-10-CM | POA: Diagnosis not present

## 2014-11-05 DIAGNOSIS — D631 Anemia in chronic kidney disease: Secondary | ICD-10-CM | POA: Diagnosis not present

## 2014-11-05 DIAGNOSIS — N186 End stage renal disease: Secondary | ICD-10-CM | POA: Diagnosis not present

## 2014-11-05 DIAGNOSIS — Z992 Dependence on renal dialysis: Secondary | ICD-10-CM | POA: Diagnosis not present

## 2014-11-05 DIAGNOSIS — N2581 Secondary hyperparathyroidism of renal origin: Secondary | ICD-10-CM | POA: Diagnosis not present

## 2014-11-08 ENCOUNTER — Ambulatory Visit: Payer: Medicare Other | Admitting: Family

## 2014-11-08 ENCOUNTER — Encounter (HOSPITAL_COMMUNITY): Payer: Medicare Other

## 2014-11-08 ENCOUNTER — Other Ambulatory Visit (HOSPITAL_COMMUNITY): Payer: Medicare Other

## 2014-11-08 DIAGNOSIS — D509 Iron deficiency anemia, unspecified: Secondary | ICD-10-CM | POA: Diagnosis not present

## 2014-11-08 DIAGNOSIS — D631 Anemia in chronic kidney disease: Secondary | ICD-10-CM | POA: Diagnosis not present

## 2014-11-08 DIAGNOSIS — N2581 Secondary hyperparathyroidism of renal origin: Secondary | ICD-10-CM | POA: Diagnosis not present

## 2014-11-08 DIAGNOSIS — N186 End stage renal disease: Secondary | ICD-10-CM | POA: Diagnosis not present

## 2014-11-10 DIAGNOSIS — D509 Iron deficiency anemia, unspecified: Secondary | ICD-10-CM | POA: Diagnosis not present

## 2014-11-10 DIAGNOSIS — N186 End stage renal disease: Secondary | ICD-10-CM | POA: Diagnosis not present

## 2014-11-10 DIAGNOSIS — N2581 Secondary hyperparathyroidism of renal origin: Secondary | ICD-10-CM | POA: Diagnosis not present

## 2014-11-10 DIAGNOSIS — D631 Anemia in chronic kidney disease: Secondary | ICD-10-CM | POA: Diagnosis not present

## 2014-11-11 ENCOUNTER — Encounter: Payer: Self-pay | Admitting: Family

## 2014-11-12 DIAGNOSIS — N186 End stage renal disease: Secondary | ICD-10-CM | POA: Diagnosis not present

## 2014-11-12 DIAGNOSIS — D631 Anemia in chronic kidney disease: Secondary | ICD-10-CM | POA: Diagnosis not present

## 2014-11-12 DIAGNOSIS — N2581 Secondary hyperparathyroidism of renal origin: Secondary | ICD-10-CM | POA: Diagnosis not present

## 2014-11-12 DIAGNOSIS — D509 Iron deficiency anemia, unspecified: Secondary | ICD-10-CM | POA: Diagnosis not present

## 2014-11-14 ENCOUNTER — Ambulatory Visit (HOSPITAL_COMMUNITY)
Admission: RE | Admit: 2014-11-14 | Discharge: 2014-11-14 | Disposition: A | Discharge status: Home / Self Care | Payer: Medicare Other | Source: Ambulatory Visit | Attending: Family | Admitting: Family

## 2014-11-14 ENCOUNTER — Ambulatory Visit (INDEPENDENT_AMBULATORY_CARE_PROVIDER_SITE_OTHER): Payer: Medicare Other | Admitting: Family

## 2014-11-14 ENCOUNTER — Ambulatory Visit (INDEPENDENT_AMBULATORY_CARE_PROVIDER_SITE_OTHER)
Admission: RE | Admit: 2014-11-14 | Discharge: 2014-11-14 | Disposition: A | Discharge status: Home / Self Care | Payer: Medicare Other | Source: Ambulatory Visit | Attending: Vascular Surgery | Admitting: Vascular Surgery

## 2014-11-14 ENCOUNTER — Encounter: Payer: Self-pay | Admitting: Family

## 2014-11-14 VITALS — BP 118/87 | HR 75 | Resp 16 | Ht 71.0 in | Wt 237.0 lb

## 2014-11-14 DIAGNOSIS — I739 Peripheral vascular disease, unspecified: Secondary | ICD-10-CM | POA: Insufficient documentation

## 2014-11-14 DIAGNOSIS — Z9889 Other specified postprocedural states: Secondary | ICD-10-CM | POA: Diagnosis not present

## 2014-11-14 DIAGNOSIS — Z48812 Encounter for surgical aftercare following surgery on the circulatory system: Secondary | ICD-10-CM | POA: Insufficient documentation

## 2014-11-14 DIAGNOSIS — Z992 Dependence on renal dialysis: Secondary | ICD-10-CM | POA: Diagnosis not present

## 2014-11-14 DIAGNOSIS — Z87891 Personal history of nicotine dependence: Secondary | ICD-10-CM | POA: Diagnosis not present

## 2014-11-14 DIAGNOSIS — N186 End stage renal disease: Secondary | ICD-10-CM | POA: Diagnosis not present

## 2014-11-14 DIAGNOSIS — R29898 Other symptoms and signs involving the musculoskeletal system: Secondary | ICD-10-CM | POA: Insufficient documentation

## 2014-11-14 DIAGNOSIS — Z95828 Presence of other vascular implants and grafts: Secondary | ICD-10-CM

## 2014-11-14 LAB — VAS US LOWER EXTREMITY BYPASS GRAFT DUPLEX
LEFT ART DIST ANAST PEAK SYS VEL: 51 cm/s
LEFT ART DIST GRAFT PEAK SYS VEL: 67 cm/s
LEFT ART MID GRAFT PEAK SYS VEL: 75 cm/s
LEFT ART PROX ANAST PEAK SYS: 97 cm/s
LEFT ART PROX GRAFT PEAK SYS VEL: 73 cm/s
LEFT INFLOW ART PEAK SYS VEL: 52 cm/s
LEFT OUTFLOW ART PEAK SYS VEL: 87 cm/s

## 2014-11-14 NOTE — Patient Instructions (Signed)

## 2014-11-14 NOTE — Progress Notes (Signed)
[facility] [place] HISTORY AND PHYSICAL -PAD  History of Present Illness Timothy Palmer is a 49 y.o. male patient of Dr. Donnetta Hutching who returns today for followup of his left above-knee to below-knee popliteal bypass on 03/25/14. He had probable mycotic embolus from heart valve. He recovered from the surgery and underwent an open repair.  He has a long history of greater than 11 years of hemodialysis for renal failure, seems to be using his original HD access. He is gradually feeling better, low energy but starting to increase his walking and general activity.  He has moderate pain in his left calf after walking uphill for a long distance. He denies non healing wounds, denies rest pain.   The patient reports New Medical or Surgical History: ballooning of a stenosis in his left forearm AVF on October 07 2014, possibly by Dr. Augustin Coupe; pt states AVF is working well; dialyzes T-TH-SAT. He has left fingertips numbness all of the time.  He denies any history of stroke or TIA.  Pt Diabetic: No Pt smoker: former smoker, quit in 2001  Pt meds include: Statin :No ASA: Yes Other anticoagulants/antiplatelets: coumadin  Past Medical History  Diagnosis Date  . Hypertension   . Peripheral vascular disease   . Pneumonia 2014  . ESRD (end stage renal disease) on dialysis     [place],Dialysis- T,Th,S  . Acute on chronic diastolic heart failure [id number]  . Severe mitral regurgitation 03/23/2014    by TEE  . Positive blood culture 11/27/2013    MSSA  . Positive blood culture 02/24/2014    MSSA  . DVT of upper extremity (deep vein thrombosis) 03/04/2014    Right arm  . Knee pain, left 03/21/2014  . Acute combined systolic and diastolic congestive heart failure 03/21/2014  . Chronic diastolic congestive heart failure   . 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 Septic embolism to left  lower extremity 03/25/2014  . 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 The aneurysm measures 1.4 x 1.1 cm which is  significantly larger than the 4.5 mm parent vessel.   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 S/P minimally invasive mitral valve repair 04/05/2014    Complex valvuloplasty including autologous pericardial patch repair of large perforation of posterior leaflet due to endocarditis with 28 mm Sorin Memo 3D ring annuloplasty via right mini thoracotomy approach    Social History History  Substance Use Topics  . Smoking status: Never Smoker   . Smokeless tobacco: Never Used  . Alcohol Use: Yes     Comment: 03/23/2014 "glass or 2 of gin twice/month"    Family History Family History  Problem Relation Age of Onset  . Diabetes Mother   . Hypertension Mother   . Hypertension Father   . Hypertension Sister   . Hypertension Brother     Past Surgical History  Procedure Laterality Date  . Av fistula placement Left ?2005    forearm   . Shuntogram Left November 04, 2011  . Patch angioplasty Left 07/23/2013    Procedure: PATCH ANGIOPLASTY- LEFT RADIOCEPHALIC ARTERIOVENOUS FISTULA;  Surgeon: Angelia Mould, MD;  Location: [place];  Service: Vascular;  Laterality: Left;  . Tee without cardioversion N/A 03/24/2014    Procedure: TRANSESOPHAGEAL ECHOCARDIOGRAM (TEE);  Surgeon: Larey Dresser,  MD;  Location: [place];  Service: Cardiovascular;  Laterality: N/A;  . Embolectomy Left 03/25/2014    Procedure: EMBOLECTOMY left popliteal;  Surgeon: Rosetta Posner, MD;  Location: [place];  Service: Vascular;  Laterality: Left;  . Femoral-popliteal bypass graft Left 03/25/2014    Procedure: Left Femoral- Below Knee Popliteal Bypass Graft;  Surgeon: Rosetta Posner, MD;  Location: [place];  Service: Vascular;  Laterality: Left;  . Mitral valve repair Right 04/05/2014    Procedure:  MINIMALLY INVASIVE MITRAL VALVE REPAIR (MVR);  Surgeon: Rexene Alberts, MD;  Location: [place];  Service: Open Heart Surgery;  Laterality: Right;  . Intraoperative transesophageal echocardiogram N/A 04/05/2014    Procedure: INTRAOPERATIVE TRANSESOPHAGEAL ECHOCARDIOGRAM;  Surgeon: Rexene Alberts, MD;  Location: [place];  Service: Open Heart Surgery;  Laterality: N/A;  . Mitral valve replacement Right 04/05/2014    Procedure: Bring back MINIMALLY INVASIVE MITRAL VALVE (MV) REPLACEMENT - Reexploration for bleeding;  Surgeon: Rexene Alberts, MD;  Location: [place];  Service: Open Heart Surgery;  Laterality: Right;  . Left and right heart catheterization with coronary angiogram N/A 03/28/2014    Procedure: LEFT AND RIGHT HEART CATHETERIZATION WITH CORONARY ANGIOGRAM;  Surgeon: Larey Dresser, MD;  Location: [facility] C[facility] LAB;  Service: Cardiovascular;  Laterality: N/A;    No Known Allergies  Current Outpatient Prescriptions  Medication Sig Dispense Refill  . amiodarone (PACERONE) 200 MG tablet Take 200 mg by mouth daily.    . carvedilol (COREG) 6.25 MG tablet Take 1 tablet (6.25 mg total) by mouth 2 (two) times daily. 60 tablet 3  . cinacalcet (SENSIPAR) 60 MG tablet Take 60 mg by mouth daily.    . darbepoetin (ARANESP) 100 MCG/0.5ML SOLN injection Inject 0.5 mLs (100 mcg total) into the vein every Saturday with hemodialysis. (Patient not taking: Reported on 08/09/2014) 4.2 mL   . lisinopril (PRINIVIL,ZESTRIL) 20 MG tablet Take 20 mg by mouth daily.    . sevelamer carbonate (RENVELA) 800 MG tablet Take 2 tablets (1,600 mg total) by mouth 3 (three) times daily with meals. 180 tablet 1  . warfarin (COUMADIN) 5 MG tablet Take as directed by [facility] 60 tablet 1   No current facility-administered medications for this visit.    ROS: See HPI for pertinent positives and negatives.   Physical Examination  Filed Vitals:   11/14/14 1325  BP: 118/87  Pulse: 75  Resp: 16  Height: 5\' 11"  (1.803 m)   Weight: 237 lb (107.502 kg)  SpO2: 100%   Body mass index is 33.07 kg/(m^2).  General: A&O x 3, WDWN, obese male. Gait: normal Eyes: PERRLA. Pulmonary: CTAB, without wheezes , rales or rhonchi. Cardiac: regular Rythm , without detected murmur.         Carotid Bruits Right Left   Negative Negative   Aorta is not palpable. Radial pulses: faintly palpable, both brachial pulses are 1+ palpable. Left forearm AVF does not have a palpable thrill, bruit is audible                           VASCULAR EXAM: Extremities without ischemic changes, without Gangrene; without open wounds.  LE Pulses Right Left       FEMORAL  faintly palpable  not palpable        POPLITEAL  not palpable   not palpable       POSTERIOR TIBIAL  not palpable   not palpable        DORSALIS PEDIS      ANTERIOR TIBIAL 2+ palpable  2+ palpable    Abdomen: soft, NT, no palpable masses. Skin: no rashes, no ulcers, onchyomycosis most toenails. Musculoskeletal: no muscle wasting or atrophy.  Neurologic: A&O X 3; Appropriate Affect ; SENSATION: normal; MOTOR FUNCTION:  moving all extremities equally, motor strength 5/5 throughout. Speech is fluent/normal. CN 2-12 intact.    Non-Invasive Vascular Imaging: DATE: 11/14/2014 LOWER EXTREMITY ARTERIAL DUPLEX EVALUATION    INDICATION: Follow-up left lower extremity bypass graft     PREVIOUS INTERVENTION(S): Failed embolectomy of left popliteal artery 03/25/2014 with placement of above knee to below knee popliteal graft that same day.    DUPLEX EXAM:     RIGHT  LEFT   Peak Systolic Velocity (cm/s) Ratio (if abnormal) Waveform  Peak Systolic Velocity (cm/s) Ratio (if abnormal) Waveform     Inflow Artery 52  T     Proximal Anastomosis 97  T     Proximal Graft 73  T     Mid Graft 75  T      Distal Graft 67  T     Distal Anastomosis 51  T     Outflow Artery 87  T   1.29- triphasic Today's ABI / TBI Unreliable- triphasic  1.20 Previous ABI / TBI (08/02/2014 ) 1.19    Waveform:    M - Monophasic       B - Biphasic       T - Triphasic  If Ankle Brachial Index (ABI) or Toe Brachial Index (TBI) performed, please see complete report  ADDITIONAL FINDINGS:     IMPRESSION: 1. Widely patent left popliteal bypass graft without evidence of restenosis or hyperplasia.  2. The distal anastomosis appears to be ectatic measuring 1.15cm in diameter.    Compared to the previous exam:  No significant change compared to prior exam.       ASSESSMENT: Timothy Palmer is a 49 y.o. male who is s/p left above-knee to below-knee popliteal bypass on 03/25/14. He had probable mycotic embolus from heart valve.  He has moderate pain in his left calf after walking uphill for a long distance. He denies non healing wounds, denies rest pain. Today's left LE arterial Duplex suggests a wdely patent left popliteal bypass graft without evidence of restenosis or hyperplasia.  The distal anastomosis appears to be ectatic measuring 1.15cm in diameter. No significant change compared to prior exam.    PLAN:  Graduated walking program discussed in detail. I discussed in depth with the patient the nature of atherosclerosis, and emphasized the importance of maximal medical management including strict control of blood pressure, blood glucose, and lipid levels, obtaining regular exercise, and continued cessation of smoking.  The patient is aware that without maximal medical management the underlying atherosclerotic disease process will progress, limiting the benefit of any interventions.  Based on the patient's vascular studies and examination, pt will return to clinic in 3 months with ABI's and left LE arterial Duplex, according to postoperative surveillance timeline.  The patient was given information about PAD including signs, symptoms, treatment, what symptoms should prompt the patient to  seek immediate medical care, and risk reduction measures  to take.  Clemon Chambers, RN, MSN, FNP-C [facility] [employer] [facility] Phone: [phone]  Clinic MD: Trula Slade  11/14/2014 1:30 PM

## 2014-11-15 DIAGNOSIS — D631 Anemia in chronic kidney disease: Secondary | ICD-10-CM | POA: Diagnosis not present

## 2014-11-15 DIAGNOSIS — N186 End stage renal disease: Secondary | ICD-10-CM | POA: Diagnosis not present

## 2014-11-15 DIAGNOSIS — D509 Iron deficiency anemia, unspecified: Secondary | ICD-10-CM | POA: Diagnosis not present

## 2014-11-15 DIAGNOSIS — N2581 Secondary hyperparathyroidism of renal origin: Secondary | ICD-10-CM | POA: Diagnosis not present

## 2014-11-15 NOTE — Addendum Note (Signed)
Addended by: Reola Calkins on: 11/15/2014 03:42 PM   Modules accepted: Orders

## 2014-11-17 DIAGNOSIS — N2581 Secondary hyperparathyroidism of renal origin: Secondary | ICD-10-CM | POA: Diagnosis not present

## 2014-11-17 DIAGNOSIS — N186 End stage renal disease: Secondary | ICD-10-CM | POA: Diagnosis not present

## 2014-11-17 DIAGNOSIS — D509 Iron deficiency anemia, unspecified: Secondary | ICD-10-CM | POA: Diagnosis not present

## 2014-11-17 DIAGNOSIS — D631 Anemia in chronic kidney disease: Secondary | ICD-10-CM | POA: Diagnosis not present

## 2014-11-19 DIAGNOSIS — N2581 Secondary hyperparathyroidism of renal origin: Secondary | ICD-10-CM | POA: Diagnosis not present

## 2014-11-19 DIAGNOSIS — D631 Anemia in chronic kidney disease: Secondary | ICD-10-CM | POA: Diagnosis not present

## 2014-11-19 DIAGNOSIS — D509 Iron deficiency anemia, unspecified: Secondary | ICD-10-CM | POA: Diagnosis not present

## 2014-11-19 DIAGNOSIS — N186 End stage renal disease: Secondary | ICD-10-CM | POA: Diagnosis not present

## 2014-11-22 DIAGNOSIS — N2581 Secondary hyperparathyroidism of renal origin: Secondary | ICD-10-CM | POA: Diagnosis not present

## 2014-11-22 DIAGNOSIS — D631 Anemia in chronic kidney disease: Secondary | ICD-10-CM | POA: Diagnosis not present

## 2014-11-22 DIAGNOSIS — D509 Iron deficiency anemia, unspecified: Secondary | ICD-10-CM | POA: Diagnosis not present

## 2014-11-22 DIAGNOSIS — N186 End stage renal disease: Secondary | ICD-10-CM | POA: Diagnosis not present

## 2014-11-24 DIAGNOSIS — N186 End stage renal disease: Secondary | ICD-10-CM | POA: Diagnosis not present

## 2014-11-24 DIAGNOSIS — N2581 Secondary hyperparathyroidism of renal origin: Secondary | ICD-10-CM | POA: Diagnosis not present

## 2014-11-24 DIAGNOSIS — R001 Bradycardia, unspecified: Secondary | ICD-10-CM | POA: Diagnosis not present

## 2014-11-24 DIAGNOSIS — D631 Anemia in chronic kidney disease: Secondary | ICD-10-CM | POA: Diagnosis not present

## 2014-11-24 DIAGNOSIS — D509 Iron deficiency anemia, unspecified: Secondary | ICD-10-CM | POA: Diagnosis not present

## 2014-11-24 LAB — PROTIME-INR: INR: 2.1 — AB (ref 0.9–1.1)

## 2014-11-26 DIAGNOSIS — D631 Anemia in chronic kidney disease: Secondary | ICD-10-CM | POA: Diagnosis not present

## 2014-11-26 DIAGNOSIS — D509 Iron deficiency anemia, unspecified: Secondary | ICD-10-CM | POA: Diagnosis not present

## 2014-11-26 DIAGNOSIS — N186 End stage renal disease: Secondary | ICD-10-CM | POA: Diagnosis not present

## 2014-11-26 DIAGNOSIS — N2581 Secondary hyperparathyroidism of renal origin: Secondary | ICD-10-CM | POA: Diagnosis not present

## 2014-11-28 ENCOUNTER — Ambulatory Visit (INDEPENDENT_AMBULATORY_CARE_PROVIDER_SITE_OTHER): Payer: Medicare Other | Admitting: Interventional Cardiology

## 2014-11-28 DIAGNOSIS — I76 Septic arterial embolism: Secondary | ICD-10-CM

## 2014-11-28 DIAGNOSIS — I480 Paroxysmal atrial fibrillation: Secondary | ICD-10-CM

## 2014-11-28 DIAGNOSIS — I2690 Septic pulmonary embolism without acute cor pulmonale: Secondary | ICD-10-CM

## 2014-11-28 DIAGNOSIS — Z5181 Encounter for therapeutic drug level monitoring: Secondary | ICD-10-CM

## 2014-11-29 DIAGNOSIS — N186 End stage renal disease: Secondary | ICD-10-CM | POA: Diagnosis not present

## 2014-11-29 DIAGNOSIS — N2581 Secondary hyperparathyroidism of renal origin: Secondary | ICD-10-CM | POA: Diagnosis not present

## 2014-11-29 DIAGNOSIS — D631 Anemia in chronic kidney disease: Secondary | ICD-10-CM | POA: Diagnosis not present

## 2014-11-29 DIAGNOSIS — D509 Iron deficiency anemia, unspecified: Secondary | ICD-10-CM | POA: Diagnosis not present

## 2014-12-01 DIAGNOSIS — N2581 Secondary hyperparathyroidism of renal origin: Secondary | ICD-10-CM | POA: Diagnosis not present

## 2014-12-01 DIAGNOSIS — D631 Anemia in chronic kidney disease: Secondary | ICD-10-CM | POA: Diagnosis not present

## 2014-12-01 DIAGNOSIS — D509 Iron deficiency anemia, unspecified: Secondary | ICD-10-CM | POA: Diagnosis not present

## 2014-12-01 DIAGNOSIS — R001 Bradycardia, unspecified: Secondary | ICD-10-CM | POA: Diagnosis not present

## 2014-12-01 DIAGNOSIS — N186 End stage renal disease: Secondary | ICD-10-CM | POA: Diagnosis not present

## 2014-12-03 DIAGNOSIS — D509 Iron deficiency anemia, unspecified: Secondary | ICD-10-CM | POA: Diagnosis not present

## 2014-12-03 DIAGNOSIS — N2581 Secondary hyperparathyroidism of renal origin: Secondary | ICD-10-CM | POA: Diagnosis not present

## 2014-12-03 DIAGNOSIS — N186 End stage renal disease: Secondary | ICD-10-CM | POA: Diagnosis not present

## 2014-12-03 DIAGNOSIS — D631 Anemia in chronic kidney disease: Secondary | ICD-10-CM | POA: Diagnosis not present

## 2014-12-06 DIAGNOSIS — D631 Anemia in chronic kidney disease: Secondary | ICD-10-CM | POA: Diagnosis not present

## 2014-12-06 DIAGNOSIS — I120 Hypertensive chronic kidney disease with stage 5 chronic kidney disease or end stage renal disease: Secondary | ICD-10-CM | POA: Diagnosis not present

## 2014-12-06 DIAGNOSIS — N186 End stage renal disease: Secondary | ICD-10-CM | POA: Diagnosis not present

## 2014-12-06 DIAGNOSIS — Z992 Dependence on renal dialysis: Secondary | ICD-10-CM | POA: Diagnosis not present

## 2014-12-06 DIAGNOSIS — D509 Iron deficiency anemia, unspecified: Secondary | ICD-10-CM | POA: Diagnosis not present

## 2014-12-06 DIAGNOSIS — N2581 Secondary hyperparathyroidism of renal origin: Secondary | ICD-10-CM | POA: Diagnosis not present

## 2014-12-08 DIAGNOSIS — D509 Iron deficiency anemia, unspecified: Secondary | ICD-10-CM | POA: Diagnosis not present

## 2014-12-08 DIAGNOSIS — N186 End stage renal disease: Secondary | ICD-10-CM | POA: Diagnosis not present

## 2014-12-08 DIAGNOSIS — D631 Anemia in chronic kidney disease: Secondary | ICD-10-CM | POA: Diagnosis not present

## 2014-12-08 DIAGNOSIS — N2581 Secondary hyperparathyroidism of renal origin: Secondary | ICD-10-CM | POA: Diagnosis not present

## 2014-12-10 DIAGNOSIS — N186 End stage renal disease: Secondary | ICD-10-CM | POA: Diagnosis not present

## 2014-12-10 DIAGNOSIS — D509 Iron deficiency anemia, unspecified: Secondary | ICD-10-CM | POA: Diagnosis not present

## 2014-12-10 DIAGNOSIS — D631 Anemia in chronic kidney disease: Secondary | ICD-10-CM | POA: Diagnosis not present

## 2014-12-10 DIAGNOSIS — N2581 Secondary hyperparathyroidism of renal origin: Secondary | ICD-10-CM | POA: Diagnosis not present

## 2014-12-13 DIAGNOSIS — N186 End stage renal disease: Secondary | ICD-10-CM | POA: Diagnosis not present

## 2014-12-13 DIAGNOSIS — D509 Iron deficiency anemia, unspecified: Secondary | ICD-10-CM | POA: Diagnosis not present

## 2014-12-13 DIAGNOSIS — D631 Anemia in chronic kidney disease: Secondary | ICD-10-CM | POA: Diagnosis not present

## 2014-12-13 DIAGNOSIS — N2581 Secondary hyperparathyroidism of renal origin: Secondary | ICD-10-CM | POA: Diagnosis not present

## 2014-12-15 DIAGNOSIS — N2581 Secondary hyperparathyroidism of renal origin: Secondary | ICD-10-CM | POA: Diagnosis not present

## 2014-12-15 DIAGNOSIS — I481 Persistent atrial fibrillation: Secondary | ICD-10-CM | POA: Diagnosis not present

## 2014-12-15 DIAGNOSIS — N186 End stage renal disease: Secondary | ICD-10-CM | POA: Diagnosis not present

## 2014-12-15 DIAGNOSIS — I480 Paroxysmal atrial fibrillation: Secondary | ICD-10-CM | POA: Diagnosis not present

## 2014-12-15 DIAGNOSIS — D631 Anemia in chronic kidney disease: Secondary | ICD-10-CM | POA: Diagnosis not present

## 2014-12-15 DIAGNOSIS — D509 Iron deficiency anemia, unspecified: Secondary | ICD-10-CM | POA: Diagnosis not present

## 2014-12-16 ENCOUNTER — Ambulatory Visit (INDEPENDENT_AMBULATORY_CARE_PROVIDER_SITE_OTHER): Payer: Medicare Other | Admitting: Interventional Cardiology

## 2014-12-16 DIAGNOSIS — I76 Septic arterial embolism: Secondary | ICD-10-CM

## 2014-12-16 DIAGNOSIS — I480 Paroxysmal atrial fibrillation: Secondary | ICD-10-CM

## 2014-12-16 DIAGNOSIS — Z5181 Encounter for therapeutic drug level monitoring: Secondary | ICD-10-CM

## 2014-12-16 DIAGNOSIS — I2690 Septic pulmonary embolism without acute cor pulmonale: Secondary | ICD-10-CM

## 2014-12-16 DIAGNOSIS — I82629 Acute embolism and thrombosis of deep veins of unspecified upper extremity: Secondary | ICD-10-CM

## 2014-12-16 LAB — PROTIME-INR: INR: 2.4 — AB (ref 0.9–1.1)

## 2014-12-17 DIAGNOSIS — D509 Iron deficiency anemia, unspecified: Secondary | ICD-10-CM | POA: Diagnosis not present

## 2014-12-17 DIAGNOSIS — N186 End stage renal disease: Secondary | ICD-10-CM | POA: Diagnosis not present

## 2014-12-17 DIAGNOSIS — N2581 Secondary hyperparathyroidism of renal origin: Secondary | ICD-10-CM | POA: Diagnosis not present

## 2014-12-17 DIAGNOSIS — D631 Anemia in chronic kidney disease: Secondary | ICD-10-CM | POA: Diagnosis not present

## 2014-12-20 DIAGNOSIS — D631 Anemia in chronic kidney disease: Secondary | ICD-10-CM | POA: Diagnosis not present

## 2014-12-20 DIAGNOSIS — D509 Iron deficiency anemia, unspecified: Secondary | ICD-10-CM | POA: Diagnosis not present

## 2014-12-20 DIAGNOSIS — N2581 Secondary hyperparathyroidism of renal origin: Secondary | ICD-10-CM | POA: Diagnosis not present

## 2014-12-20 DIAGNOSIS — N186 End stage renal disease: Secondary | ICD-10-CM | POA: Diagnosis not present

## 2014-12-22 DIAGNOSIS — N2581 Secondary hyperparathyroidism of renal origin: Secondary | ICD-10-CM | POA: Diagnosis not present

## 2014-12-22 DIAGNOSIS — N186 End stage renal disease: Secondary | ICD-10-CM | POA: Diagnosis not present

## 2014-12-22 DIAGNOSIS — D631 Anemia in chronic kidney disease: Secondary | ICD-10-CM | POA: Diagnosis not present

## 2014-12-22 DIAGNOSIS — D509 Iron deficiency anemia, unspecified: Secondary | ICD-10-CM | POA: Diagnosis not present

## 2014-12-24 DIAGNOSIS — N186 End stage renal disease: Secondary | ICD-10-CM | POA: Diagnosis not present

## 2014-12-24 DIAGNOSIS — D631 Anemia in chronic kidney disease: Secondary | ICD-10-CM | POA: Diagnosis not present

## 2014-12-24 DIAGNOSIS — D509 Iron deficiency anemia, unspecified: Secondary | ICD-10-CM | POA: Diagnosis not present

## 2014-12-24 DIAGNOSIS — N2581 Secondary hyperparathyroidism of renal origin: Secondary | ICD-10-CM | POA: Diagnosis not present

## 2014-12-27 DIAGNOSIS — D509 Iron deficiency anemia, unspecified: Secondary | ICD-10-CM | POA: Diagnosis not present

## 2014-12-27 DIAGNOSIS — D631 Anemia in chronic kidney disease: Secondary | ICD-10-CM | POA: Diagnosis not present

## 2014-12-27 DIAGNOSIS — N186 End stage renal disease: Secondary | ICD-10-CM | POA: Diagnosis not present

## 2014-12-27 DIAGNOSIS — N2581 Secondary hyperparathyroidism of renal origin: Secondary | ICD-10-CM | POA: Diagnosis not present

## 2014-12-29 DIAGNOSIS — N2581 Secondary hyperparathyroidism of renal origin: Secondary | ICD-10-CM | POA: Diagnosis not present

## 2014-12-29 DIAGNOSIS — R001 Bradycardia, unspecified: Secondary | ICD-10-CM | POA: Diagnosis not present

## 2014-12-29 DIAGNOSIS — N186 End stage renal disease: Secondary | ICD-10-CM | POA: Diagnosis not present

## 2014-12-29 DIAGNOSIS — D509 Iron deficiency anemia, unspecified: Secondary | ICD-10-CM | POA: Diagnosis not present

## 2014-12-29 DIAGNOSIS — D631 Anemia in chronic kidney disease: Secondary | ICD-10-CM | POA: Diagnosis not present

## 2014-12-31 DIAGNOSIS — D631 Anemia in chronic kidney disease: Secondary | ICD-10-CM | POA: Diagnosis not present

## 2014-12-31 DIAGNOSIS — N2581 Secondary hyperparathyroidism of renal origin: Secondary | ICD-10-CM | POA: Diagnosis not present

## 2014-12-31 DIAGNOSIS — D509 Iron deficiency anemia, unspecified: Secondary | ICD-10-CM | POA: Diagnosis not present

## 2014-12-31 DIAGNOSIS — N186 End stage renal disease: Secondary | ICD-10-CM | POA: Diagnosis not present

## 2015-01-03 DIAGNOSIS — N2581 Secondary hyperparathyroidism of renal origin: Secondary | ICD-10-CM | POA: Diagnosis not present

## 2015-01-03 DIAGNOSIS — D631 Anemia in chronic kidney disease: Secondary | ICD-10-CM | POA: Diagnosis not present

## 2015-01-03 DIAGNOSIS — D509 Iron deficiency anemia, unspecified: Secondary | ICD-10-CM | POA: Diagnosis not present

## 2015-01-03 DIAGNOSIS — N186 End stage renal disease: Secondary | ICD-10-CM | POA: Diagnosis not present

## 2015-01-05 ENCOUNTER — Other Ambulatory Visit: Payer: Self-pay | Admitting: *Deleted

## 2015-01-05 DIAGNOSIS — N2581 Secondary hyperparathyroidism of renal origin: Secondary | ICD-10-CM | POA: Diagnosis not present

## 2015-01-05 DIAGNOSIS — Z992 Dependence on renal dialysis: Secondary | ICD-10-CM | POA: Diagnosis not present

## 2015-01-05 DIAGNOSIS — D631 Anemia in chronic kidney disease: Secondary | ICD-10-CM | POA: Diagnosis not present

## 2015-01-05 DIAGNOSIS — D509 Iron deficiency anemia, unspecified: Secondary | ICD-10-CM | POA: Diagnosis not present

## 2015-01-05 DIAGNOSIS — N186 End stage renal disease: Secondary | ICD-10-CM | POA: Diagnosis not present

## 2015-01-05 DIAGNOSIS — I120 Hypertensive chronic kidney disease with stage 5 chronic kidney disease or end stage renal disease: Secondary | ICD-10-CM | POA: Diagnosis not present

## 2015-01-05 MED ORDER — WARFARIN SODIUM 5 MG PO TABS: ORAL_TABLET | ORAL | Status: DC

## 2015-01-07 DIAGNOSIS — N186 End stage renal disease: Secondary | ICD-10-CM | POA: Diagnosis not present

## 2015-01-07 DIAGNOSIS — D509 Iron deficiency anemia, unspecified: Secondary | ICD-10-CM | POA: Diagnosis not present

## 2015-01-07 DIAGNOSIS — D631 Anemia in chronic kidney disease: Secondary | ICD-10-CM | POA: Diagnosis not present

## 2015-01-07 DIAGNOSIS — N2581 Secondary hyperparathyroidism of renal origin: Secondary | ICD-10-CM | POA: Diagnosis not present

## 2015-01-10 DIAGNOSIS — D509 Iron deficiency anemia, unspecified: Secondary | ICD-10-CM | POA: Diagnosis not present

## 2015-01-10 DIAGNOSIS — D631 Anemia in chronic kidney disease: Secondary | ICD-10-CM | POA: Diagnosis not present

## 2015-01-10 DIAGNOSIS — N186 End stage renal disease: Secondary | ICD-10-CM | POA: Diagnosis not present

## 2015-01-10 DIAGNOSIS — N2581 Secondary hyperparathyroidism of renal origin: Secondary | ICD-10-CM | POA: Diagnosis not present

## 2015-01-12 DIAGNOSIS — N2581 Secondary hyperparathyroidism of renal origin: Secondary | ICD-10-CM | POA: Diagnosis not present

## 2015-01-12 DIAGNOSIS — D509 Iron deficiency anemia, unspecified: Secondary | ICD-10-CM | POA: Diagnosis not present

## 2015-01-12 DIAGNOSIS — D631 Anemia in chronic kidney disease: Secondary | ICD-10-CM | POA: Diagnosis not present

## 2015-01-12 DIAGNOSIS — N186 End stage renal disease: Secondary | ICD-10-CM | POA: Diagnosis not present

## 2015-01-14 DIAGNOSIS — D509 Iron deficiency anemia, unspecified: Secondary | ICD-10-CM | POA: Diagnosis not present

## 2015-01-14 DIAGNOSIS — D631 Anemia in chronic kidney disease: Secondary | ICD-10-CM | POA: Diagnosis not present

## 2015-01-14 DIAGNOSIS — N186 End stage renal disease: Secondary | ICD-10-CM | POA: Diagnosis not present

## 2015-01-14 DIAGNOSIS — N2581 Secondary hyperparathyroidism of renal origin: Secondary | ICD-10-CM | POA: Diagnosis not present

## 2015-01-16 ENCOUNTER — Telehealth: Payer: Self-pay | Admitting: *Deleted

## 2015-01-16 NOTE — Telephone Encounter (Signed)
Called [facility] and spoke with Sharon-RN in reference to the patient's INR results that were due on 01/12/15.  She stated that she did not have any results for that date and she was unable to locate any orders.  The patient is due to have hemodialysis in the morning and an order was given to have the INR drawn tomorrow and fax to the [facility].  She verbalized understanding, will monitor for lab results.

## 2015-01-17 DIAGNOSIS — D631 Anemia in chronic kidney disease: Secondary | ICD-10-CM | POA: Diagnosis not present

## 2015-01-17 DIAGNOSIS — D509 Iron deficiency anemia, unspecified: Secondary | ICD-10-CM | POA: Diagnosis not present

## 2015-01-17 DIAGNOSIS — N2581 Secondary hyperparathyroidism of renal origin: Secondary | ICD-10-CM | POA: Diagnosis not present

## 2015-01-17 DIAGNOSIS — N186 End stage renal disease: Secondary | ICD-10-CM | POA: Diagnosis not present

## 2015-01-17 DIAGNOSIS — I480 Paroxysmal atrial fibrillation: Secondary | ICD-10-CM | POA: Diagnosis not present

## 2015-01-17 LAB — PROTIME-INR: INR: 2.3 — AB (ref 0.9–1.1)

## 2015-01-19 DIAGNOSIS — D509 Iron deficiency anemia, unspecified: Secondary | ICD-10-CM | POA: Diagnosis not present

## 2015-01-19 DIAGNOSIS — D631 Anemia in chronic kidney disease: Secondary | ICD-10-CM | POA: Diagnosis not present

## 2015-01-19 DIAGNOSIS — N2581 Secondary hyperparathyroidism of renal origin: Secondary | ICD-10-CM | POA: Diagnosis not present

## 2015-01-19 DIAGNOSIS — N186 End stage renal disease: Secondary | ICD-10-CM | POA: Diagnosis not present

## 2015-01-20 ENCOUNTER — Ambulatory Visit (INDEPENDENT_AMBULATORY_CARE_PROVIDER_SITE_OTHER): Payer: Medicare Other | Admitting: Cardiology

## 2015-01-20 DIAGNOSIS — I76 Septic arterial embolism: Secondary | ICD-10-CM

## 2015-01-20 DIAGNOSIS — I480 Paroxysmal atrial fibrillation: Secondary | ICD-10-CM

## 2015-01-20 DIAGNOSIS — I2690 Septic pulmonary embolism without acute cor pulmonale: Secondary | ICD-10-CM

## 2015-01-20 DIAGNOSIS — I82629 Acute embolism and thrombosis of deep veins of unspecified upper extremity: Secondary | ICD-10-CM

## 2015-01-20 DIAGNOSIS — Z5181 Encounter for therapeutic drug level monitoring: Secondary | ICD-10-CM

## 2015-01-21 DIAGNOSIS — N2581 Secondary hyperparathyroidism of renal origin: Secondary | ICD-10-CM | POA: Diagnosis not present

## 2015-01-21 DIAGNOSIS — D631 Anemia in chronic kidney disease: Secondary | ICD-10-CM | POA: Diagnosis not present

## 2015-01-21 DIAGNOSIS — D509 Iron deficiency anemia, unspecified: Secondary | ICD-10-CM | POA: Diagnosis not present

## 2015-01-21 DIAGNOSIS — N186 End stage renal disease: Secondary | ICD-10-CM | POA: Diagnosis not present

## 2015-01-24 DIAGNOSIS — N2581 Secondary hyperparathyroidism of renal origin: Secondary | ICD-10-CM | POA: Diagnosis not present

## 2015-01-24 DIAGNOSIS — D631 Anemia in chronic kidney disease: Secondary | ICD-10-CM | POA: Diagnosis not present

## 2015-01-24 DIAGNOSIS — D509 Iron deficiency anemia, unspecified: Secondary | ICD-10-CM | POA: Diagnosis not present

## 2015-01-24 DIAGNOSIS — N186 End stage renal disease: Secondary | ICD-10-CM | POA: Diagnosis not present

## 2015-01-26 DIAGNOSIS — D509 Iron deficiency anemia, unspecified: Secondary | ICD-10-CM | POA: Diagnosis not present

## 2015-01-26 DIAGNOSIS — N186 End stage renal disease: Secondary | ICD-10-CM | POA: Diagnosis not present

## 2015-01-26 DIAGNOSIS — D631 Anemia in chronic kidney disease: Secondary | ICD-10-CM | POA: Diagnosis not present

## 2015-01-26 DIAGNOSIS — N2581 Secondary hyperparathyroidism of renal origin: Secondary | ICD-10-CM | POA: Diagnosis not present

## 2015-01-28 DIAGNOSIS — N2581 Secondary hyperparathyroidism of renal origin: Secondary | ICD-10-CM | POA: Diagnosis not present

## 2015-01-28 DIAGNOSIS — D631 Anemia in chronic kidney disease: Secondary | ICD-10-CM | POA: Diagnosis not present

## 2015-01-28 DIAGNOSIS — N186 End stage renal disease: Secondary | ICD-10-CM | POA: Diagnosis not present

## 2015-01-28 DIAGNOSIS — D509 Iron deficiency anemia, unspecified: Secondary | ICD-10-CM | POA: Diagnosis not present

## 2015-01-31 DIAGNOSIS — N2581 Secondary hyperparathyroidism of renal origin: Secondary | ICD-10-CM | POA: Diagnosis not present

## 2015-01-31 DIAGNOSIS — D509 Iron deficiency anemia, unspecified: Secondary | ICD-10-CM | POA: Diagnosis not present

## 2015-01-31 DIAGNOSIS — D631 Anemia in chronic kidney disease: Secondary | ICD-10-CM | POA: Diagnosis not present

## 2015-01-31 DIAGNOSIS — N186 End stage renal disease: Secondary | ICD-10-CM | POA: Diagnosis not present

## 2015-02-02 DIAGNOSIS — N2581 Secondary hyperparathyroidism of renal origin: Secondary | ICD-10-CM | POA: Diagnosis not present

## 2015-02-02 DIAGNOSIS — D631 Anemia in chronic kidney disease: Secondary | ICD-10-CM | POA: Diagnosis not present

## 2015-02-02 DIAGNOSIS — N186 End stage renal disease: Secondary | ICD-10-CM | POA: Diagnosis not present

## 2015-02-02 DIAGNOSIS — D509 Iron deficiency anemia, unspecified: Secondary | ICD-10-CM | POA: Diagnosis not present

## 2015-02-02 DIAGNOSIS — R001 Bradycardia, unspecified: Secondary | ICD-10-CM | POA: Diagnosis not present

## 2015-02-02 DIAGNOSIS — E1129 Type 2 diabetes mellitus with other diabetic kidney complication: Secondary | ICD-10-CM | POA: Diagnosis not present

## 2015-02-04 DIAGNOSIS — N2581 Secondary hyperparathyroidism of renal origin: Secondary | ICD-10-CM | POA: Diagnosis not present

## 2015-02-04 DIAGNOSIS — N186 End stage renal disease: Secondary | ICD-10-CM | POA: Diagnosis not present

## 2015-02-04 DIAGNOSIS — D631 Anemia in chronic kidney disease: Secondary | ICD-10-CM | POA: Diagnosis not present

## 2015-02-04 DIAGNOSIS — D509 Iron deficiency anemia, unspecified: Secondary | ICD-10-CM | POA: Diagnosis not present

## 2015-02-05 DIAGNOSIS — N186 End stage renal disease: Secondary | ICD-10-CM | POA: Diagnosis not present

## 2015-02-05 DIAGNOSIS — Z992 Dependence on renal dialysis: Secondary | ICD-10-CM | POA: Diagnosis not present

## 2015-02-05 DIAGNOSIS — I120 Hypertensive chronic kidney disease with stage 5 chronic kidney disease or end stage renal disease: Secondary | ICD-10-CM | POA: Diagnosis not present

## 2015-02-07 DIAGNOSIS — N186 End stage renal disease: Secondary | ICD-10-CM | POA: Diagnosis not present

## 2015-02-07 DIAGNOSIS — N2581 Secondary hyperparathyroidism of renal origin: Secondary | ICD-10-CM | POA: Diagnosis not present

## 2015-02-07 DIAGNOSIS — D509 Iron deficiency anemia, unspecified: Secondary | ICD-10-CM | POA: Diagnosis not present

## 2015-02-09 DIAGNOSIS — N2581 Secondary hyperparathyroidism of renal origin: Secondary | ICD-10-CM | POA: Diagnosis not present

## 2015-02-09 DIAGNOSIS — D509 Iron deficiency anemia, unspecified: Secondary | ICD-10-CM | POA: Diagnosis not present

## 2015-02-09 DIAGNOSIS — N186 End stage renal disease: Secondary | ICD-10-CM | POA: Diagnosis not present

## 2015-02-11 DIAGNOSIS — N2581 Secondary hyperparathyroidism of renal origin: Secondary | ICD-10-CM | POA: Diagnosis not present

## 2015-02-11 DIAGNOSIS — D509 Iron deficiency anemia, unspecified: Secondary | ICD-10-CM | POA: Diagnosis not present

## 2015-02-11 DIAGNOSIS — N186 End stage renal disease: Secondary | ICD-10-CM | POA: Diagnosis not present

## 2015-02-14 DIAGNOSIS — N2581 Secondary hyperparathyroidism of renal origin: Secondary | ICD-10-CM | POA: Diagnosis not present

## 2015-02-14 DIAGNOSIS — I480 Paroxysmal atrial fibrillation: Secondary | ICD-10-CM | POA: Diagnosis not present

## 2015-02-14 DIAGNOSIS — D509 Iron deficiency anemia, unspecified: Secondary | ICD-10-CM | POA: Diagnosis not present

## 2015-02-14 DIAGNOSIS — N186 End stage renal disease: Secondary | ICD-10-CM | POA: Diagnosis not present

## 2015-02-15 ENCOUNTER — Encounter: Payer: Self-pay | Admitting: Family

## 2015-02-15 LAB — PROTIME-INR: INR: 1.9 — AB (ref 0.9–1.1)

## 2015-02-16 ENCOUNTER — Ambulatory Visit (INDEPENDENT_AMBULATORY_CARE_PROVIDER_SITE_OTHER): Payer: Medicare Other | Admitting: Family

## 2015-02-16 ENCOUNTER — Encounter: Payer: Self-pay | Admitting: Family

## 2015-02-16 ENCOUNTER — Ambulatory Visit (INDEPENDENT_AMBULATORY_CARE_PROVIDER_SITE_OTHER)
Admission: RE | Admit: 2015-02-16 | Discharge: 2015-02-16 | Disposition: A | Discharge status: Home / Self Care | Payer: Medicare Other | Source: Ambulatory Visit | Attending: Family | Admitting: Family

## 2015-02-16 ENCOUNTER — Ambulatory Visit (HOSPITAL_COMMUNITY)
Admission: RE | Admit: 2015-02-16 | Discharge: 2015-02-16 | Disposition: A | Discharge status: Home / Self Care | Payer: Medicare Other | Source: Ambulatory Visit | Attending: Family | Admitting: Family

## 2015-02-16 ENCOUNTER — Ambulatory Visit (INDEPENDENT_AMBULATORY_CARE_PROVIDER_SITE_OTHER): Payer: Medicare Other | Admitting: Cardiology

## 2015-02-16 VITALS — BP 124/88 | HR 91 | Temp 98.3°F | Ht 71.0 in | Wt 233.9 lb

## 2015-02-16 DIAGNOSIS — Z48812 Encounter for surgical aftercare following surgery on the circulatory system: Secondary | ICD-10-CM

## 2015-02-16 DIAGNOSIS — Z5181 Encounter for therapeutic drug level monitoring: Secondary | ICD-10-CM

## 2015-02-16 DIAGNOSIS — Z9889 Other specified postprocedural states: Secondary | ICD-10-CM | POA: Diagnosis not present

## 2015-02-16 DIAGNOSIS — N186 End stage renal disease: Secondary | ICD-10-CM

## 2015-02-16 DIAGNOSIS — I76 Septic arterial embolism: Secondary | ICD-10-CM

## 2015-02-16 DIAGNOSIS — I480 Paroxysmal atrial fibrillation: Secondary | ICD-10-CM

## 2015-02-16 DIAGNOSIS — Z95828 Presence of other vascular implants and grafts: Secondary | ICD-10-CM

## 2015-02-16 DIAGNOSIS — D509 Iron deficiency anemia, unspecified: Secondary | ICD-10-CM | POA: Diagnosis not present

## 2015-02-16 DIAGNOSIS — N2581 Secondary hyperparathyroidism of renal origin: Secondary | ICD-10-CM | POA: Diagnosis not present

## 2015-02-16 DIAGNOSIS — I2690 Septic pulmonary embolism without acute cor pulmonale: Secondary | ICD-10-CM

## 2015-02-16 DIAGNOSIS — Z992 Dependence on renal dialysis: Secondary | ICD-10-CM

## 2015-02-16 DIAGNOSIS — Z87891 Personal history of nicotine dependence: Secondary | ICD-10-CM | POA: Insufficient documentation

## 2015-02-16 DIAGNOSIS — I10 Essential (primary) hypertension: Secondary | ICD-10-CM | POA: Insufficient documentation

## 2015-02-16 DIAGNOSIS — I82629 Acute embolism and thrombosis of deep veins of unspecified upper extremity: Secondary | ICD-10-CM

## 2015-02-16 NOTE — Patient Instructions (Signed)

## 2015-02-16 NOTE — Progress Notes (Signed)
[facility] [place] HISTORY AND PHYSICAL -PAD  History of Present Illness Timothy Palmer is a 49 y.o. male  patient of Dr. Donnetta Hutching who returns today for followup of his left above-knee to below-knee popliteal bypass on 03/25/14. He had probable mycotic embolus from heart valve. He recovered from the surgery and underwent an open repair. He has a long history of greater than 11 years of hemodialysis for renal failure, seems to be using his original HD access. He is gradually feeling better, low energy but starting to increase his walking and general activity.  He is walking 1-2 miles daily. He denies non healing wounds, denies rest pain, denies any claudication pain.   The patient reports New Medical or Surgical History: none. He had a ballooning of a stenosis in his left forearm AVF on October 07 2014, possibly by Dr. Augustin Coupe; pt states AVF is working well; dialyzes T-TH-SAT. He has left fingertips numbness all of the time.  He denies any history of stroke or TIA.  Pt Diabetic: No Pt smoker: former smoker, quit in 2001  Pt meds include: Statin :No ASA: Yes Other anticoagulants/antiplatelets: coumadin    Past Medical History  Diagnosis Date  . Hypertension   . Peripheral vascular disease   . Pneumonia 2014  . ESRD (end stage renal disease) on dialysis     [place],Dialysis- T,Th,S  . Acute on chronic diastolic heart failure [id number]  . Severe mitral regurgitation 03/23/2014    by TEE  . Positive blood culture 11/27/2013    MSSA  . Positive blood culture 02/24/2014    MSSA  . DVT of upper extremity (deep vein thrombosis) 03/04/2014    Right arm  . Knee pain, left 03/21/2014  . Acute combined systolic and diastolic congestive heart failure 03/21/2014  . Chronic diastolic congestive heart failure   . 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 Septic embolism to  left lower extremity 03/25/2014  . 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 The aneurysm measures 1.4 x 1.1 cm which is  significantly larger than the 4.5 mm parent vessel.   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 S/P minimally invasive mitral valve repair 04/05/2014    Complex valvuloplasty including autologous pericardial patch repair of large perforation of posterior leaflet due to endocarditis with 28 mm Sorin Memo 3D ring annuloplasty via right mini thoracotomy approach    Social History Social History  Substance Use Topics  . Smoking status: Never Smoker   . Smokeless tobacco: Never Used  . Alcohol Use: Yes     Comment: 03/23/2014 "glass or 2 of gin twice/month"    Family History Family History  Problem Relation Age of Onset  . Diabetes Mother   . Hypertension Mother   . Hypertension Father   . Hypertension Sister   . Hypertension Brother     Past Surgical History  Procedure Laterality Date  . Av fistula placement Left ?2005    forearm   . Shuntogram Left November 04, 2011  . Patch angioplasty Left 07/23/2013    Procedure: PATCH ANGIOPLASTY- LEFT RADIOCEPHALIC ARTERIOVENOUS FISTULA;  Surgeon: Angelia Mould, MD;  Location: [place];  Service: Vascular;  Laterality: Left;  . Tee without cardioversion N/A 03/24/2014    Procedure: TRANSESOPHAGEAL ECHOCARDIOGRAM (TEE);  Surgeon: Kirk Ruths  Claris Gladden, MD;  Location: [facility] ENDOSCOPY;  Service: Cardiovascular;  Laterality: N/A;  . Embolectomy Left 03/25/2014    Procedure: EMBOLECTOMY left popliteal;  Surgeon: Rosetta Posner, MD;  Location: [place];  Service: Vascular;  Laterality: Left;  . Femoral-popliteal bypass graft Left 03/25/2014    Procedure: Left Femoral- Below Knee Popliteal Bypass Graft;  Surgeon: Rosetta Posner, MD;  Location: [place];  Service: Vascular;  Laterality: Left;  . Mitral valve repair Right 04/05/2014     Procedure: MINIMALLY INVASIVE MITRAL VALVE REPAIR (MVR);  Surgeon: Rexene Alberts, MD;  Location: [place];  Service: Open Heart Surgery;  Laterality: Right;  . Intraoperative transesophageal echocardiogram N/A 04/05/2014    Procedure: INTRAOPERATIVE TRANSESOPHAGEAL ECHOCARDIOGRAM;  Surgeon: Rexene Alberts, MD;  Location: [place];  Service: Open Heart Surgery;  Laterality: N/A;  . Mitral valve replacement Right 04/05/2014    Procedure: Bring back MINIMALLY INVASIVE MITRAL VALVE (MV) REPLACEMENT - Reexploration for bleeding;  Surgeon: Rexene Alberts, MD;  Location: [place];  Service: Open Heart Surgery;  Laterality: Right;  . Left and right heart catheterization with coronary angiogram N/A 03/28/2014    Procedure: LEFT AND RIGHT HEART CATHETERIZATION WITH CORONARY ANGIOGRAM;  Surgeon: Larey Dresser, MD;  Location: [facility] CATH LAB;  Service: Cardiovascular;  Laterality: N/A;    No Known Allergies  Current Outpatient Prescriptions  Medication Sig Dispense Refill  . amiodarone (PACERONE) 200 MG tablet Take 200 mg by mouth daily.    . carvedilol (COREG) 6.25 MG tablet Take 1 tablet (6.25 mg total) by mouth 2 (two) times daily. 60 tablet 3  . cinacalcet (SENSIPAR) 60 MG tablet Take 60 mg by mouth daily.    Marland Kitchen lisinopril (PRINIVIL,ZESTRIL) 20 MG tablet Take 20 mg by mouth daily.    . SENSIPAR 90 MG tablet 90 mg daily. Take two 90 mg tablets at Lunch time    . sevelamer carbonate (RENVELA) 800 MG tablet Take 2 tablets (1,600 mg total) by mouth 3 (three) times daily with meals. 180 tablet 1  . warfarin (COUMADIN) 5 MG tablet Take as directed by [facility] 60 tablet 2  . darbepoetin (ARANESP) 100 MCG/0.5ML SOLN injection Inject 0.5 mLs (100 mcg total) into the vein every Saturday with hemodialysis. (Patient not taking: Reported on 08/09/2014) 4.2 mL    No current facility-administered medications for this visit.    ROS: See HPI for pertinent positives and negatives.   Physical Examination  Filed Vitals:    02/16/15 1414  BP: 124/88  Pulse: 91  Temp: 98.3 F (36.8 C)  TempSrc: Oral  Height: 5\' 11"  (1.803 m)  Weight: 233 lb 14.4 oz (106.096 kg)  SpO2: 98%   Body mass index is 32.64 kg/(m^2).  General: A&O x 3, WDWN, obese male. Gait: normal Eyes: PERRLA. Pulmonary: CTAB, without wheezes , rales or rhonchi. Cardiac: regular Rythm , no detected murmur.     Carotid Bruits Right Left   Negative Negative   Aorta is not palpable. Radial pulses: faintly palpable, both brachial pulses are 1+ palpable. Left forearm AVF does not have a palpable thrill, bruit is audible   VASCULAR EXAM: Extremities without ischemic changes, without Gangrene; without open wounds.     LE Pulses Right Left   FEMORAL faintly palpable (obese) Faintly palpable (obese)    POPLITEAL not palpable  not palpable   POSTERIOR TIBIAL not palpable  not palpable    DORSALIS PEDIS  ANTERIOR TIBIAL 1+ palpable  2+ palpable  Abdomen: soft, NT, no palpable masses. Skin: no rashes, no ulcers, onchyomycosis most toenails. Musculoskeletal: no muscle wasting or atrophy. Neurologic: A&O X 3; Appropriate Affect,; MOTOR FUNCTION: moving all extremities equally, motor strength 5/5 throughout. Speech is fluent/normal. CN 2-12 intact.          Non-Invasive Vascular Imaging: DATE: 02/16/2015 LOWER EXTREMITY ARTERIAL DUPLEX EVALUATION    INDICATION: Follow-up left lower extremity bypass graft     PREVIOUS INTERVENTION(S): Embolic bacterial endocarditis with attempted embolectomy of left popliteal artery and subsequent AK-BK popliteal graft 03/25/2014.    DUPLEX EXAM:     RIGHT  LEFT   Peak Systolic Velocity (cm/s) Ratio (if abnormal) Waveform  Peak Systolic Velocity (cm/s) Ratio (if  abnormal) Waveform     Inflow Artery 90  T     Proximal Anastomosis 92  T     Proximal Graft 97  T     Mid Graft 65  T      Distal Graft 64  T     Distal Anastomosis 88  T     Outflow Artery 120  T  1.29 Today's ABI / TBI 1.5 (Unreliable)  1.29 Previous ABI / TBI (11/14/2014 ) 1.4    Waveform:    M - Monophasic       B - Biphasic       T - Triphasic  If Ankle Brachial Index (ABI) or Toe Brachial Index (TBI) performed, please see complete report  ADDITIONAL FINDINGS:     IMPRESSION: 1. Widely patent left popliteal artery bypass graft without evidence of restenosis; mild hyperplasia is observed in the native outflow PT trunk without stenosis.  2. Stable ectasia of the distal anastomosis measuring 1.15cm in diameter.    Compared to the previous exam:  No significant change compared to prior exam.      ASSESSMENT: Timothy Palmer is a 49 y.o. male who is s/p  left above-knee to below-knee popliteal bypass on 03/25/14. He had probable mycotic embolus from heart valve.  He has a long history of greater than 11 years of hemodialysis for renal failure, seems to be using his original HD access. He walks 1-2 miles daily, no claudication symptoms, no non healing wounds. Today's left LE arterial Duplex suggests a widely patent left popliteal artery bypass graft without evidence of restenosis; mild hyperplasia is observed in the native outflow PT trunk without stenosis. No significant change compared to prior exam on 11/14/14.   PLAN:  Continue graduated walking program under safe conditions. Based on the patient's vascular studies and examination, pt will return to clinic in 6 months with ABI's and left LE arterial Duplex.  I discussed in depth with the patient the nature of atherosclerosis, and emphasized the importance of maximal medical management including strict control of blood pressure, blood glucose, and lipid levels, obtaining regular exercise, and continued cessation of smoking.  The  patient is aware that without maximal medical management the underlying atherosclerotic disease process will progress, limiting the benefit of any interventions.  The patient was given information about PAD including signs, symptoms, treatment, what symptoms should prompt the patient to seek immediate medical care, and risk reduction measures to take.  Clemon Chambers, RN, MSN, FNP-C [facility] Phone: [phone]  Clinic MD: Oneida Alar  02/16/2015 2:40 PM

## 2015-02-18 DIAGNOSIS — D509 Iron deficiency anemia, unspecified: Secondary | ICD-10-CM | POA: Diagnosis not present

## 2015-02-18 DIAGNOSIS — N186 End stage renal disease: Secondary | ICD-10-CM | POA: Diagnosis not present

## 2015-02-18 DIAGNOSIS — N2581 Secondary hyperparathyroidism of renal origin: Secondary | ICD-10-CM | POA: Diagnosis not present

## 2015-02-20 NOTE — Addendum Note (Signed)
Addended by: Dorthula Rue L on: 02/20/2015 09:41 AM   Modules accepted: Orders

## 2015-02-21 DIAGNOSIS — D509 Iron deficiency anemia, unspecified: Secondary | ICD-10-CM | POA: Diagnosis not present

## 2015-02-21 DIAGNOSIS — N2581 Secondary hyperparathyroidism of renal origin: Secondary | ICD-10-CM | POA: Diagnosis not present

## 2015-02-21 DIAGNOSIS — N186 End stage renal disease: Secondary | ICD-10-CM | POA: Diagnosis not present

## 2015-02-23 DIAGNOSIS — N2581 Secondary hyperparathyroidism of renal origin: Secondary | ICD-10-CM | POA: Diagnosis not present

## 2015-02-23 DIAGNOSIS — N186 End stage renal disease: Secondary | ICD-10-CM | POA: Diagnosis not present

## 2015-02-23 DIAGNOSIS — D509 Iron deficiency anemia, unspecified: Secondary | ICD-10-CM | POA: Diagnosis not present

## 2015-02-25 DIAGNOSIS — N2581 Secondary hyperparathyroidism of renal origin: Secondary | ICD-10-CM | POA: Diagnosis not present

## 2015-02-25 DIAGNOSIS — D509 Iron deficiency anemia, unspecified: Secondary | ICD-10-CM | POA: Diagnosis not present

## 2015-02-25 DIAGNOSIS — N186 End stage renal disease: Secondary | ICD-10-CM | POA: Diagnosis not present

## 2015-02-28 DIAGNOSIS — D509 Iron deficiency anemia, unspecified: Secondary | ICD-10-CM | POA: Diagnosis not present

## 2015-02-28 DIAGNOSIS — N2581 Secondary hyperparathyroidism of renal origin: Secondary | ICD-10-CM | POA: Diagnosis not present

## 2015-02-28 DIAGNOSIS — N186 End stage renal disease: Secondary | ICD-10-CM | POA: Diagnosis not present

## 2015-03-02 DIAGNOSIS — N2581 Secondary hyperparathyroidism of renal origin: Secondary | ICD-10-CM | POA: Diagnosis not present

## 2015-03-02 DIAGNOSIS — R001 Bradycardia, unspecified: Secondary | ICD-10-CM | POA: Diagnosis not present

## 2015-03-02 DIAGNOSIS — D509 Iron deficiency anemia, unspecified: Secondary | ICD-10-CM | POA: Diagnosis not present

## 2015-03-02 DIAGNOSIS — N186 End stage renal disease: Secondary | ICD-10-CM | POA: Diagnosis not present

## 2015-03-02 LAB — PROTIME-INR: INR: 2.2 — AB (ref <=1.1)

## 2015-03-04 DIAGNOSIS — D509 Iron deficiency anemia, unspecified: Secondary | ICD-10-CM | POA: Diagnosis not present

## 2015-03-04 DIAGNOSIS — N2581 Secondary hyperparathyroidism of renal origin: Secondary | ICD-10-CM | POA: Diagnosis not present

## 2015-03-04 DIAGNOSIS — N186 End stage renal disease: Secondary | ICD-10-CM | POA: Diagnosis not present

## 2015-03-07 DIAGNOSIS — N186 End stage renal disease: Secondary | ICD-10-CM | POA: Diagnosis not present

## 2015-03-07 DIAGNOSIS — N2581 Secondary hyperparathyroidism of renal origin: Secondary | ICD-10-CM | POA: Diagnosis not present

## 2015-03-07 DIAGNOSIS — D509 Iron deficiency anemia, unspecified: Secondary | ICD-10-CM | POA: Diagnosis not present

## 2015-03-08 DIAGNOSIS — I120 Hypertensive chronic kidney disease with stage 5 chronic kidney disease or end stage renal disease: Secondary | ICD-10-CM | POA: Diagnosis not present

## 2015-03-08 DIAGNOSIS — Z992 Dependence on renal dialysis: Secondary | ICD-10-CM | POA: Diagnosis not present

## 2015-03-08 DIAGNOSIS — N186 End stage renal disease: Secondary | ICD-10-CM | POA: Diagnosis not present

## 2015-03-09 DIAGNOSIS — D631 Anemia in chronic kidney disease: Secondary | ICD-10-CM | POA: Diagnosis not present

## 2015-03-09 DIAGNOSIS — D509 Iron deficiency anemia, unspecified: Secondary | ICD-10-CM | POA: Diagnosis not present

## 2015-03-09 DIAGNOSIS — N2581 Secondary hyperparathyroidism of renal origin: Secondary | ICD-10-CM | POA: Diagnosis not present

## 2015-03-09 DIAGNOSIS — N186 End stage renal disease: Secondary | ICD-10-CM | POA: Diagnosis not present

## 2015-03-10 ENCOUNTER — Ambulatory Visit (INDEPENDENT_AMBULATORY_CARE_PROVIDER_SITE_OTHER): Payer: Medicare Other | Admitting: Cardiovascular Disease

## 2015-03-10 DIAGNOSIS — Z5181 Encounter for therapeutic drug level monitoring: Secondary | ICD-10-CM

## 2015-03-10 DIAGNOSIS — I76 Septic arterial embolism: Secondary | ICD-10-CM

## 2015-03-10 DIAGNOSIS — I480 Paroxysmal atrial fibrillation: Secondary | ICD-10-CM

## 2015-03-10 DIAGNOSIS — I82629 Acute embolism and thrombosis of deep veins of unspecified upper extremity: Secondary | ICD-10-CM

## 2015-03-10 DIAGNOSIS — I2690 Septic pulmonary embolism without acute cor pulmonale: Secondary | ICD-10-CM

## 2015-03-14 DIAGNOSIS — D631 Anemia in chronic kidney disease: Secondary | ICD-10-CM | POA: Diagnosis not present

## 2015-03-14 DIAGNOSIS — N186 End stage renal disease: Secondary | ICD-10-CM | POA: Diagnosis not present

## 2015-03-14 DIAGNOSIS — N2581 Secondary hyperparathyroidism of renal origin: Secondary | ICD-10-CM | POA: Diagnosis not present

## 2015-03-14 DIAGNOSIS — D509 Iron deficiency anemia, unspecified: Secondary | ICD-10-CM | POA: Diagnosis not present

## 2015-03-16 DIAGNOSIS — N186 End stage renal disease: Secondary | ICD-10-CM | POA: Diagnosis not present

## 2015-03-16 DIAGNOSIS — N2581 Secondary hyperparathyroidism of renal origin: Secondary | ICD-10-CM | POA: Diagnosis not present

## 2015-03-16 DIAGNOSIS — D631 Anemia in chronic kidney disease: Secondary | ICD-10-CM | POA: Diagnosis not present

## 2015-03-16 DIAGNOSIS — D509 Iron deficiency anemia, unspecified: Secondary | ICD-10-CM | POA: Diagnosis not present

## 2015-03-18 DIAGNOSIS — D631 Anemia in chronic kidney disease: Secondary | ICD-10-CM | POA: Diagnosis not present

## 2015-03-18 DIAGNOSIS — N2581 Secondary hyperparathyroidism of renal origin: Secondary | ICD-10-CM | POA: Diagnosis not present

## 2015-03-18 DIAGNOSIS — D509 Iron deficiency anemia, unspecified: Secondary | ICD-10-CM | POA: Diagnosis not present

## 2015-03-18 DIAGNOSIS — N186 End stage renal disease: Secondary | ICD-10-CM | POA: Diagnosis not present

## 2015-03-19 ENCOUNTER — Emergency Department (HOSPITAL_COMMUNITY): Payer: Medicare Other

## 2015-03-19 ENCOUNTER — Emergency Department (HOSPITAL_COMMUNITY)
Admission: EM | Admit: 2015-03-19 | Discharge: 2015-03-19 | Disposition: A | Discharge status: Home / Self Care | Payer: Medicare Other | Attending: Emergency Medicine | Admitting: Emergency Medicine

## 2015-03-19 ENCOUNTER — Encounter (HOSPITAL_COMMUNITY): Payer: Self-pay

## 2015-03-19 DIAGNOSIS — Z86718 Personal history of other venous thrombosis and embolism: Secondary | ICD-10-CM | POA: Insufficient documentation

## 2015-03-19 DIAGNOSIS — Z992 Dependence on renal dialysis: Secondary | ICD-10-CM | POA: Diagnosis not present

## 2015-03-19 DIAGNOSIS — Z7901 Long term (current) use of anticoagulants: Secondary | ICD-10-CM | POA: Insufficient documentation

## 2015-03-19 DIAGNOSIS — Z9889 Other specified postprocedural states: Secondary | ICD-10-CM | POA: Diagnosis not present

## 2015-03-19 DIAGNOSIS — R0602 Shortness of breath: Secondary | ICD-10-CM | POA: Insufficient documentation

## 2015-03-19 DIAGNOSIS — I5042 Chronic combined systolic (congestive) and diastolic (congestive) heart failure: Secondary | ICD-10-CM | POA: Diagnosis not present

## 2015-03-19 DIAGNOSIS — N186 End stage renal disease: Secondary | ICD-10-CM | POA: Diagnosis not present

## 2015-03-19 DIAGNOSIS — Z79899 Other long term (current) drug therapy: Secondary | ICD-10-CM | POA: Diagnosis not present

## 2015-03-19 DIAGNOSIS — I120 Hypertensive chronic kidney disease with stage 5 chronic kidney disease or end stage renal disease: Secondary | ICD-10-CM | POA: Insufficient documentation

## 2015-03-19 DIAGNOSIS — R091 Pleurisy: Secondary | ICD-10-CM

## 2015-03-19 DIAGNOSIS — Z862 Personal history of diseases of the blood and blood-forming organs and certain disorders involving the immune mechanism: Secondary | ICD-10-CM | POA: Diagnosis not present

## 2015-03-19 DIAGNOSIS — R079 Chest pain, unspecified: Secondary | ICD-10-CM | POA: Diagnosis present

## 2015-03-19 DIAGNOSIS — Z8701 Personal history of pneumonia (recurrent): Secondary | ICD-10-CM | POA: Diagnosis not present

## 2015-03-19 LAB — BASIC METABOLIC PANEL
Anion gap: 11 (ref 5–15)
BUN: 28 mg/dL — ABNORMAL HIGH (ref 6–20)
CO2: 32 mmol/L (ref 22–32)
Calcium: 9.7 mg/dL (ref 8.9–10.3)
Chloride: 93 mmol/L — ABNORMAL LOW (ref 101–111)
Creatinine, Ser: 11.31 mg/dL — ABNORMAL HIGH (ref 0.61–1.24)
GFR calc Af Amer: 5 mL/min — ABNORMAL LOW (ref >60)
GFR calc non Af Amer: 5 mL/min — ABNORMAL LOW (ref >60)
Glucose, Bld: 132 mg/dL — ABNORMAL HIGH (ref 65–99)
POTASSIUM: 3.8 mmol/L (ref 3.5–5.1)
SODIUM: 136 mmol/L (ref 135–145)

## 2015-03-19 LAB — CBC
HCT: 24.9 % — ABNORMAL LOW (ref 39.0–52.0)
Hemoglobin: 8.3 g/dL — ABNORMAL LOW (ref 13.0–17.0)
MCH: 33.2 pg (ref 26.0–34.0)
MCHC: 33.3 g/dL (ref 30.0–36.0)
MCV: 99.6 fL (ref 78.0–100.0)
Platelets: 233 10*3/uL (ref 150–400)
RBC: 2.5 MIL/uL — ABNORMAL LOW (ref 4.22–5.81)
RDW: 16.4 % — ABNORMAL HIGH (ref 11.5–15.5)
WBC: 8.7 10*3/uL (ref 4.0–10.5)

## 2015-03-19 LAB — PROTIME-INR
INR: 2.98 — ABNORMAL HIGH (ref 0.00–1.49)
Prothrombin Time: 30.4 seconds — ABNORMAL HIGH (ref 11.6–15.2)

## 2015-03-19 LAB — I-STAT TROPONIN, ED
TROPONIN I, POC: 0.02 ng/mL (ref 0.00–0.08)
TROPONIN I, POC: 0.01 ng/mL (ref 0.00–0.08)

## 2015-03-19 MED ORDER — HYDROCODONE-ACETAMINOPHEN 5-325 MG PO TABS: 2.0000 | ORAL_TABLET | ORAL | Status: DC | PRN

## 2015-03-19 NOTE — Discharge Instructions (Signed)

## 2015-03-19 NOTE — ED Notes (Signed)
Pt reports PTA pt woke up from sleep with pain that started underneath left underarm and moved to left chest, constant, worse when taking breath in, shortness of breath.  Pt has had recent cough that is improving.  Last dialysis yesterday am.

## 2015-03-19 NOTE — ED Notes (Signed)
MD at bedside. 

## 2015-03-19 NOTE — ED Provider Notes (Signed)
CSN: [id number]     Arrival date & time 03/19/15  1635 History   First MD Initiated Contact with Patient 03/19/15 1803     Chief Complaint  Patient presents with  . Chest Pain      HPI Pt reports PTA pt woke up from sleep with pain that started underneath left underarm and moved to left chest, constant, worse when taking breath in, shortness of breath. Pt has had recent cough that is improving. Last dialysis yesterday am.  Past Medical History  Diagnosis Date  . Hypertension   . Peripheral vascular disease   . Pneumonia 2014  . ESRD (end stage renal disease) on dialysis     [place],Dialysis- T,Th,S  . Acute on chronic diastolic heart failure [id number]  . Severe mitral regurgitation 03/23/2014    by TEE  . Positive blood culture 11/27/2013    MSSA  . Positive blood culture 02/24/2014    MSSA  . DVT of upper extremity (deep vein thrombosis) 03/04/2014    Right arm  . Knee pain, left 03/21/2014  . Acute combined systolic and diastolic congestive heart failure 03/21/2014  . Chronic diastolic congestive heart failure   . 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 Septic embolism to left lower extremity 03/25/2014  . 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 The aneurysm measures 1.4 x 1.1 cm which is  significantly larger than the 4.5 mm parent vessel.   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 S/P minimally invasive mitral valve repair 04/05/2014    Complex valvuloplasty including autologous pericardial patch repair of large perforation of posterior leaflet due to endocarditis with 28 mm Sorin Memo 3D ring annuloplasty via right mini thoracotomy approach   Past Surgical History  Procedure Laterality Date  . Av fistula placement Left ?2005    forearm   .  Shuntogram Left November 04, 2011  . Patch angioplasty Left 07/23/2013    Procedure: PATCH ANGIOPLASTY- LEFT RADIOCEPHALIC ARTERIOVENOUS FISTULA;  Surgeon: Angelia Mould, MD;  Location: [place];  Service: Vascular;  Laterality: Left;  . Tee without cardioversion N/A 03/24/2014    Procedure: TRANSESOPHAGEAL ECHOCARDIOGRAM (TEE);  Surgeon: Larey Dresser, MD;  Location: [facility] ENDOSCOPY;  Service: Cardiovascular;  Laterality: N/A;  . Embolectomy Left 03/25/2014    Procedure: EMBOLECTOMY left popliteal;  Surgeon: Rosetta Posner, MD;  Location: [place];  Service: Vascular;  Laterality: Left;  . Femoral-popliteal bypass graft Left 03/25/2014    Procedure: Left Femoral- Below Knee Popliteal Bypass Graft;  Surgeon: Rosetta Posner, MD;  Location: [place];  Service: Vascular;  Laterality: Left;  . Mitral valve repair Right 04/05/2014    Procedure: MINIMALLY INVASIVE MITRAL VALVE REPAIR (MVR);  Surgeon: Rexene Alberts, MD;  Location: [place];  Service: Open Heart Surgery;  Laterality: Right;  . Intraoperative transesophageal echocardiogram N/A 04/05/2014    Procedure: INTRAOPERATIVE TRANSESOPHAGEAL ECHOCARDIOGRAM;  Surgeon: Rexene Alberts, MD;  Location: [place];  Service: Open Heart Surgery;  Laterality: N/A;  . Mitral valve replacement Right 04/05/2014    Procedure: Bring back MINIMALLY INVASIVE MITRAL VALVE (MV) REPLACEMENT - Reexploration for bleeding;  Surgeon: Rexene Alberts, MD;  Location: [place];  Service: Open Heart Surgery;  Laterality: Right;  . Left and right heart catheterization with coronary angiogram N/A 03/28/2014  Procedure: LEFT AND RIGHT HEART CATHETERIZATION WITH CORONARY ANGIOGRAM;  Surgeon: Larey Dresser, MD;  Location: [facility] CATH LAB;  Service: Cardiovascular;  Laterality: N/A;   Family History  Problem Relation Age of Onset  . Diabetes Mother   . Hypertension Mother   . Hypertension Father   . Hypertension Sister   . Hypertension Brother    Social History  Substance Use Topics  . Smoking  status: Never Smoker   . Smokeless tobacco: Never Used  . Alcohol Use: No    Review of Systems  All other systems reviewed and are negative  Allergies  Review of patient's allergies indicates no known allergies.  Home Medications   Prior to Admission medications   Medication Sig Start Date End Date Taking? Authorizing Provider  amiodarone (PACERONE) 200 MG tablet Take 200 mg by mouth daily.    Historical Provider, MD  carvedilol (COREG) 6.25 MG tablet Take 1 tablet (6.25 mg total) by mouth 2 (two) times daily. 06/13/14   Larey Dresser, MD  cinacalcet (SENSIPAR) 60 MG tablet Take 60 mg by mouth daily.    Historical Provider, MD  darbepoetin (ARANESP) 100 MCG/0.5ML SOLN injection Inject 0.5 mLs (100 mcg total) into the vein every Saturday with hemodialysis. Patient not taking: Reported on 08/09/2014 04/15/14   Coolidge Breeze, PA-C  HYDROcodone-acetaminophen (NORCO/VICODIN) 5-325 MG per tablet Take 2 tablets by mouth every 4 (four) hours as needed. 03/19/15   Leonard Schwartz, MD  lisinopril (PRINIVIL,ZESTRIL) 20 MG tablet Take 20 mg by mouth daily.    Historical Provider, MD  SENSIPAR 90 MG tablet 90 mg daily. Take two 90 mg tablets at Lunch time 11/10/14   Historical Provider, MD  sevelamer carbonate (RENVELA) 800 MG tablet Take 2 tablets (1,600 mg total) by mouth 3 (three) times daily with meals. 04/15/14   Coolidge Breeze, PA-C  warfarin (COUMADIN) 5 MG tablet Take as directed by [facility] 01/05/15   Larey Dresser, MD   BP 139/79 mmHg  Pulse 82  Temp(Src) 98.4 F (36.9 C) (Oral)  Resp 14  Ht 5\' 11"  (1.803 m)  Wt 230 lb 11.2 oz (104.645 kg)  BMI 32.19 kg/m2  SpO2 100% Physical Exam Physical Exam  Nursing note and vitals reviewed. Constitutional: He is oriented to person, place, and time. He appears well-developed and well-nourished. No distress.  HENT:  Head: Normocephalic and atraumatic.  Eyes: Pupils are equal, round, and reactive to light.  Neck: Normal range of motion.   Cardiovascular: Normal rate and intact distal pulses.   Pulmonary/Chest: No respiratory distress.  Abdominal: Normal appearance. He exhibits no distension.  Musculoskeletal: Normal range of motion.  Neurological: He is alert and oriented to person, place, and time. No cranial nerve deficit.  Skin: Skin is warm and dry. No rash noted.  Psychiatric: He has a normal mood and affect. His behavior is normal.   ED Course  Procedures (including critical care time) Labs Review Labs Reviewed  BASIC METABOLIC PANEL - Abnormal; Notable for the following:    Chloride 93 (*)    Glucose, Bld 132 (*)    BUN 28 (*)    Creatinine, Ser 11.31 (*)    GFR calc non Af Amer 5 (*)    GFR calc Af Amer 5 (*)    All other components within normal limits  CBC - Abnormal; Notable for the following:    RBC 2.50 (*)    Hemoglobin 8.3 (*)    HCT 24.9 (*)  RDW 16.4 (*)    All other components within normal limits  PROTIME-INR - Abnormal; Notable for the following:    Prothrombin Time 30.4 (*)    INR 2.98 (*)    All other components within normal limits  I-STAT TROPOININ, ED  Randolm Idol, ED    Imaging Review Dg Chest 2 View  03/19/2015   CLINICAL DATA:  Chest pain  EXAM: CHEST  2 VIEW  COMPARISON:  05/30/2014  FINDINGS: Stable mild cardiomegaly and vascular pedicle widening in this patient post mitral valve repair.  Pleural and parenchymal scarring on the right with volume loss which is presumably postoperative.  Subtle small density in the peripheral left upper chest near the posterior fifth rib is likely summation shadows with the scapula. There is no edema, consolidation, effusion, or pneumothorax.  No acute osseous finding to explain chest pain.  IMPRESSION: 1. No active cardiopulmonary disease. 2. Stable pleural parenchymal scarring on the right.   Electronically Signed   By: Monte Fantasia M.D.   On: 03/19/2015 17:58   I have personally reviewed and evaluated these images and lab results as part  of my medical decision-making.   EKG Interpretation   Date/Time:  Sunday March 19 2015 16:40:30 EDT Ventricular Rate:  82 PR Interval:  182 QRS Duration: 88 QT Interval:  462 QTC Calculation: 539 R Axis:   120 Text Interpretation:  Normal sinus rhythm Left posterior fascicular block  T wave abnormality, consider anterolateral ischemia Abnormal ECG Confirmed  by Audie Pinto  MD, Kairen Hallinan ([id number]) on 03/19/2015 4:46:11 PM      MDM   Final diagnoses:  Pleurisy        Leonard Schwartz, MD 03/19/15 2053

## 2015-03-21 DIAGNOSIS — D631 Anemia in chronic kidney disease: Secondary | ICD-10-CM | POA: Diagnosis not present

## 2015-03-21 DIAGNOSIS — N186 End stage renal disease: Secondary | ICD-10-CM | POA: Diagnosis not present

## 2015-03-21 DIAGNOSIS — D509 Iron deficiency anemia, unspecified: Secondary | ICD-10-CM | POA: Diagnosis not present

## 2015-03-21 DIAGNOSIS — N2581 Secondary hyperparathyroidism of renal origin: Secondary | ICD-10-CM | POA: Diagnosis not present

## 2015-03-23 DIAGNOSIS — N2581 Secondary hyperparathyroidism of renal origin: Secondary | ICD-10-CM | POA: Diagnosis not present

## 2015-03-23 DIAGNOSIS — D631 Anemia in chronic kidney disease: Secondary | ICD-10-CM | POA: Diagnosis not present

## 2015-03-23 DIAGNOSIS — D509 Iron deficiency anemia, unspecified: Secondary | ICD-10-CM | POA: Diagnosis not present

## 2015-03-23 DIAGNOSIS — N186 End stage renal disease: Secondary | ICD-10-CM | POA: Diagnosis not present

## 2015-03-25 DIAGNOSIS — D509 Iron deficiency anemia, unspecified: Secondary | ICD-10-CM | POA: Diagnosis not present

## 2015-03-25 DIAGNOSIS — N2581 Secondary hyperparathyroidism of renal origin: Secondary | ICD-10-CM | POA: Diagnosis not present

## 2015-03-25 DIAGNOSIS — D631 Anemia in chronic kidney disease: Secondary | ICD-10-CM | POA: Diagnosis not present

## 2015-03-25 DIAGNOSIS — N186 End stage renal disease: Secondary | ICD-10-CM | POA: Diagnosis not present

## 2015-03-28 DIAGNOSIS — N186 End stage renal disease: Secondary | ICD-10-CM | POA: Diagnosis not present

## 2015-03-28 DIAGNOSIS — N2581 Secondary hyperparathyroidism of renal origin: Secondary | ICD-10-CM | POA: Diagnosis not present

## 2015-03-28 DIAGNOSIS — D631 Anemia in chronic kidney disease: Secondary | ICD-10-CM | POA: Diagnosis not present

## 2015-03-28 DIAGNOSIS — D509 Iron deficiency anemia, unspecified: Secondary | ICD-10-CM | POA: Diagnosis not present

## 2015-03-30 DIAGNOSIS — N2581 Secondary hyperparathyroidism of renal origin: Secondary | ICD-10-CM | POA: Diagnosis not present

## 2015-03-30 DIAGNOSIS — D509 Iron deficiency anemia, unspecified: Secondary | ICD-10-CM | POA: Diagnosis not present

## 2015-03-30 DIAGNOSIS — D631 Anemia in chronic kidney disease: Secondary | ICD-10-CM | POA: Diagnosis not present

## 2015-03-30 DIAGNOSIS — N186 End stage renal disease: Secondary | ICD-10-CM | POA: Diagnosis not present

## 2015-03-31 DIAGNOSIS — T82858D Stenosis of vascular prosthetic devices, implants and grafts, subsequent encounter: Secondary | ICD-10-CM | POA: Diagnosis not present

## 2015-03-31 DIAGNOSIS — I871 Compression of vein: Secondary | ICD-10-CM | POA: Diagnosis not present

## 2015-03-31 DIAGNOSIS — N186 End stage renal disease: Secondary | ICD-10-CM | POA: Diagnosis not present

## 2015-03-31 DIAGNOSIS — Z992 Dependence on renal dialysis: Secondary | ICD-10-CM | POA: Diagnosis not present

## 2015-04-01 DIAGNOSIS — N2581 Secondary hyperparathyroidism of renal origin: Secondary | ICD-10-CM | POA: Diagnosis not present

## 2015-04-01 DIAGNOSIS — N186 End stage renal disease: Secondary | ICD-10-CM | POA: Diagnosis not present

## 2015-04-01 DIAGNOSIS — D631 Anemia in chronic kidney disease: Secondary | ICD-10-CM | POA: Diagnosis not present

## 2015-04-01 DIAGNOSIS — D509 Iron deficiency anemia, unspecified: Secondary | ICD-10-CM | POA: Diagnosis not present

## 2015-04-04 ENCOUNTER — Other Ambulatory Visit (HOSPITAL_COMMUNITY): Payer: Self-pay | Admitting: *Deleted

## 2015-04-04 DIAGNOSIS — N186 End stage renal disease: Secondary | ICD-10-CM | POA: Diagnosis not present

## 2015-04-04 DIAGNOSIS — N2581 Secondary hyperparathyroidism of renal origin: Secondary | ICD-10-CM | POA: Diagnosis not present

## 2015-04-04 DIAGNOSIS — D509 Iron deficiency anemia, unspecified: Secondary | ICD-10-CM | POA: Diagnosis not present

## 2015-04-04 DIAGNOSIS — D631 Anemia in chronic kidney disease: Secondary | ICD-10-CM | POA: Diagnosis not present

## 2015-04-05 ENCOUNTER — Other Ambulatory Visit: Payer: Self-pay | Admitting: Nephrology

## 2015-04-05 ENCOUNTER — Other Ambulatory Visit: Payer: Self-pay

## 2015-04-05 ENCOUNTER — Encounter (HOSPITAL_COMMUNITY): Payer: Self-pay | Admitting: Emergency Medicine

## 2015-04-05 ENCOUNTER — Inpatient Hospital Stay (HOSPITAL_COMMUNITY)
Admission: EM | Admit: 2015-04-05 | Discharge: 2015-04-14 | DRG: 306 | Disposition: A | Discharge status: Home / Self Care | Payer: Medicare Other | Attending: Internal Medicine | Admitting: Internal Medicine

## 2015-04-05 ENCOUNTER — Ambulatory Visit (HOSPITAL_COMMUNITY)
Admission: RE | Admit: 2015-04-05 | Discharge: 2015-04-05 | Disposition: A | Discharge status: Home / Self Care | Payer: Medicare Other | Source: Ambulatory Visit | Attending: Nephrology | Admitting: Nephrology

## 2015-04-05 ENCOUNTER — Ambulatory Visit
Admission: RE | Admit: 2015-04-05 | Discharge: 2015-04-05 | Disposition: A | Discharge status: Home / Self Care | Payer: Medicare Other | Source: Ambulatory Visit | Attending: Nephrology | Admitting: Nephrology

## 2015-04-05 DIAGNOSIS — I480 Paroxysmal atrial fibrillation: Secondary | ICD-10-CM | POA: Diagnosis not present

## 2015-04-05 DIAGNOSIS — N186 End stage renal disease: Secondary | ICD-10-CM | POA: Diagnosis present

## 2015-04-05 DIAGNOSIS — Z86718 Personal history of other venous thrombosis and embolism: Secondary | ICD-10-CM

## 2015-04-05 DIAGNOSIS — I352 Nonrheumatic aortic (valve) stenosis with insufficiency: Secondary | ICD-10-CM | POA: Diagnosis not present

## 2015-04-05 DIAGNOSIS — I340 Nonrheumatic mitral (valve) insufficiency: Secondary | ICD-10-CM | POA: Diagnosis not present

## 2015-04-05 DIAGNOSIS — R590 Localized enlarged lymph nodes: Secondary | ICD-10-CM | POA: Diagnosis present

## 2015-04-05 DIAGNOSIS — N2889 Other specified disorders of kidney and ureter: Secondary | ICD-10-CM | POA: Diagnosis not present

## 2015-04-05 DIAGNOSIS — N281 Cyst of kidney, acquired: Secondary | ICD-10-CM | POA: Diagnosis present

## 2015-04-05 DIAGNOSIS — Z8679 Personal history of other diseases of the circulatory system: Secondary | ICD-10-CM | POA: Diagnosis not present

## 2015-04-05 DIAGNOSIS — I739 Peripheral vascular disease, unspecified: Secondary | ICD-10-CM | POA: Diagnosis present

## 2015-04-05 DIAGNOSIS — Z79899 Other long term (current) drug therapy: Secondary | ICD-10-CM

## 2015-04-05 DIAGNOSIS — R0902 Hypoxemia: Secondary | ICD-10-CM | POA: Diagnosis not present

## 2015-04-05 DIAGNOSIS — R05 Cough: Secondary | ICD-10-CM | POA: Diagnosis not present

## 2015-04-05 DIAGNOSIS — Z992 Dependence on renal dialysis: Secondary | ICD-10-CM | POA: Diagnosis not present

## 2015-04-05 DIAGNOSIS — E875 Hyperkalemia: Secondary | ICD-10-CM | POA: Diagnosis not present

## 2015-04-05 DIAGNOSIS — R531 Weakness: Secondary | ICD-10-CM

## 2015-04-05 DIAGNOSIS — R630 Anorexia: Secondary | ICD-10-CM

## 2015-04-05 DIAGNOSIS — R791 Abnormal coagulation profile: Secondary | ICD-10-CM | POA: Diagnosis present

## 2015-04-05 DIAGNOSIS — I5032 Chronic diastolic (congestive) heart failure: Secondary | ICD-10-CM | POA: Diagnosis present

## 2015-04-05 DIAGNOSIS — I38 Endocarditis, valve unspecified: Secondary | ICD-10-CM

## 2015-04-05 DIAGNOSIS — D689 Coagulation defect, unspecified: Secondary | ICD-10-CM

## 2015-04-05 DIAGNOSIS — N3080 Other cystitis without hematuria: Secondary | ICD-10-CM | POA: Diagnosis not present

## 2015-04-05 DIAGNOSIS — R059 Cough, unspecified: Secondary | ICD-10-CM

## 2015-04-05 DIAGNOSIS — Z7901 Long term (current) use of anticoagulants: Secondary | ICD-10-CM | POA: Diagnosis not present

## 2015-04-05 DIAGNOSIS — N289 Disorder of kidney and ureter, unspecified: Secondary | ICD-10-CM | POA: Diagnosis not present

## 2015-04-05 DIAGNOSIS — J984 Other disorders of lung: Secondary | ICD-10-CM

## 2015-04-05 DIAGNOSIS — Z6832 Body mass index (BMI) 32.0-32.9, adult: Secondary | ICD-10-CM | POA: Diagnosis not present

## 2015-04-05 DIAGNOSIS — I330 Acute and subacute infective endocarditis: Secondary | ICD-10-CM | POA: Diagnosis not present

## 2015-04-05 DIAGNOSIS — I10 Essential (primary) hypertension: Secondary | ICD-10-CM | POA: Diagnosis present

## 2015-04-05 DIAGNOSIS — N189 Chronic kidney disease, unspecified: Secondary | ICD-10-CM | POA: Diagnosis not present

## 2015-04-05 DIAGNOSIS — Z952 Presence of prosthetic heart valve: Secondary | ICD-10-CM

## 2015-04-05 DIAGNOSIS — R918 Other nonspecific abnormal finding of lung field: Secondary | ICD-10-CM | POA: Diagnosis not present

## 2015-04-05 DIAGNOSIS — E669 Obesity, unspecified: Secondary | ICD-10-CM | POA: Diagnosis present

## 2015-04-05 DIAGNOSIS — I132 Hypertensive heart and chronic kidney disease with heart failure and with stage 5 chronic kidney disease, or end stage renal disease: Secondary | ICD-10-CM | POA: Diagnosis present

## 2015-04-05 DIAGNOSIS — N2581 Secondary hyperparathyroidism of renal origin: Secondary | ICD-10-CM | POA: Diagnosis present

## 2015-04-05 DIAGNOSIS — R599 Enlarged lymph nodes, unspecified: Secondary | ICD-10-CM | POA: Diagnosis not present

## 2015-04-05 DIAGNOSIS — Z954 Presence of other heart-valve replacement: Secondary | ICD-10-CM | POA: Diagnosis not present

## 2015-04-05 DIAGNOSIS — I350 Nonrheumatic aortic (valve) stenosis: Secondary | ICD-10-CM | POA: Diagnosis not present

## 2015-04-05 DIAGNOSIS — D631 Anemia in chronic kidney disease: Secondary | ICD-10-CM | POA: Diagnosis present

## 2015-04-05 DIAGNOSIS — I120 Hypertensive chronic kidney disease with stage 5 chronic kidney disease or end stage renal disease: Secondary | ICD-10-CM | POA: Diagnosis not present

## 2015-04-05 DIAGNOSIS — F1290 Cannabis use, unspecified, uncomplicated: Secondary | ICD-10-CM | POA: Diagnosis present

## 2015-04-05 DIAGNOSIS — R0602 Shortness of breath: Secondary | ICD-10-CM | POA: Diagnosis present

## 2015-04-05 HISTORY — DX: Reserved for inherently not codable concepts without codable children: IMO0001

## 2015-04-05 LAB — CBC
HCT: 25.6 % — ABNORMAL LOW (ref 39.0–52.0)
Hemoglobin: 8.4 g/dL — ABNORMAL LOW (ref 13.0–17.0)
MCH: 30.1 pg (ref 26.0–34.0)
MCHC: 32.8 g/dL (ref 30.0–36.0)
MCV: 91.8 fL (ref 78.0–100.0)
PLATELETS: 287 10*3/uL (ref 150–400)
RBC: 2.79 MIL/uL — AB (ref 4.22–5.81)
RDW: 19.5 % — ABNORMAL HIGH (ref 11.5–15.5)
WBC: 11.5 10*3/uL — ABNORMAL HIGH (ref 4.0–10.5)

## 2015-04-05 LAB — BASIC METABOLIC PANEL
ANION GAP: 12 (ref 5–15)
BUN: 23 mg/dL — ABNORMAL HIGH (ref 6–20)
CALCIUM: 9.8 mg/dL (ref 8.9–10.3)
CO2: 30 mmol/L (ref 22–32)
CREATININE: 10.9 mg/dL — AB (ref 0.61–1.24)
Chloride: 92 mmol/L — ABNORMAL LOW (ref 101–111)
GFR, EST AFRICAN AMERICAN: 6 mL/min — AB (ref >60)
GFR, EST NON AFRICAN AMERICAN: 5 mL/min — AB (ref >60)
Glucose, Bld: 102 mg/dL — ABNORMAL HIGH (ref 65–99)
Potassium: 4.2 mmol/L (ref 3.5–5.1)
Sodium: 134 mmol/L — ABNORMAL LOW (ref 135–145)

## 2015-04-05 LAB — I-STAT TROPONIN, ED: TROPONIN I, POC: 0.06 ng/mL (ref 0.00–0.08)

## 2015-04-05 LAB — PREPARE RBC (CROSSMATCH)

## 2015-04-05 LAB — PROTIME-INR
INR: 3.63 — ABNORMAL HIGH (ref 0.00–1.49)
PROTHROMBIN TIME: 35.3 s — AB (ref 11.6–15.2)

## 2015-04-05 LAB — BRAIN NATRIURETIC PEPTIDE: B NATRIURETIC PEPTIDE 5: 1562.8 pg/mL — AB (ref 0.0–100.0)

## 2015-04-05 LAB — I-STAT CG4 LACTIC ACID, ED: Lactic Acid, Venous: 0.96 mmol/L (ref 0.5–2.0)

## 2015-04-05 MED ORDER — DIPHENHYDRAMINE HCL 25 MG PO CAPS
25.0000 mg | ORAL_CAPSULE | Freq: Once | ORAL | Status: AC
  Administered 2015-04-05: 25 mg via ORAL

## 2015-04-05 MED ORDER — SODIUM CHLORIDE 0.9 % IV SOLN: Freq: Once | INTRAVENOUS | Status: DC

## 2015-04-05 MED ORDER — SODIUM CHLORIDE 0.9 % IV BOLUS (SEPSIS)
500.0000 mL | Freq: Once | INTRAVENOUS | Status: AC
  Administered 2015-04-05: 500 mL via INTRAVENOUS

## 2015-04-05 MED ORDER — VANCOMYCIN HCL IN DEXTROSE 1-5 GM/200ML-% IV SOLN
1000.0000 mg | Freq: Once | INTRAVENOUS | Status: AC
  Administered 2015-04-05: 1000 mg via INTRAVENOUS
  Filled 2015-04-05: qty 200

## 2015-04-05 MED ORDER — ACETAMINOPHEN 325 MG PO TABS
325.0000 mg | ORAL_TABLET | Freq: Once | ORAL | Status: AC
  Administered 2015-04-05: 325 mg via ORAL

## 2015-04-05 MED ORDER — DIPHENHYDRAMINE HCL 25 MG PO CAPS
ORAL_CAPSULE | ORAL | Status: AC
  Filled 2015-04-05: qty 1

## 2015-04-05 MED ORDER — ACETAMINOPHEN 325 MG PO TABS
ORAL_TABLET | ORAL | Status: AC
  Filled 2015-04-05: qty 1

## 2015-04-05 MED ORDER — IOPAMIDOL (ISOVUE-300) INJECTION 61%
75.0000 mL | Freq: Once | INTRAVENOUS | Status: AC | PRN
  Administered 2015-04-05: 75 mL via INTRAVENOUS

## 2015-04-05 MED ORDER — PIPERACILLIN-TAZOBACTAM 3.375 G IVPB 30 MIN
3.3750 g | Freq: Once | INTRAVENOUS | Status: AC
  Administered 2015-04-05: 3.375 g via INTRAVENOUS
  Filled 2015-04-05: qty 50

## 2015-04-05 NOTE — ED Notes (Signed)
Pt has been at short stay today for blood transfusion and CT scan. Pt was called and told to come to ER for infection to L upper lobe. Pt c.o sob and weakness.

## 2015-04-05 NOTE — ED Notes (Signed)
Pt also c.o cough all day. Pt o2 sats 88-90% on RA. Placed on 2L [place].

## 2015-04-05 NOTE — ED Provider Notes (Signed)
CSN: [id number]     Arrival date & time 04/05/15  2017 History   First MD Initiated Contact with Patient 04/05/15 2134     Chief Complaint  Patient presents with  . Shortness of Breath     (Consider location/radiation/quality/duration/timing/severity/associated sxs/prior Treatment) HPI 49 year old male who presents with 2 weeks cough. 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 Also history of end-stage renal disease on hemodialysis, hypertension, peripheral vascular disease. Reports 2 week history of progressively worsening cough, productive of sputum. This is associated with generalized weakness, fatigue, night sweats, subjective fevers and chills. He had outpatient CT chest performed today by his nephrologist concerning for multiple pulmonary nodules including cavitary lesions concerning for endocarditis. Sent to ED for evaluation. Denies nausea, vomiting, abdominal pain, confusion.    Past Medical History  Diagnosis Date  . Hypertension   . Peripheral vascular disease   . Pneumonia 2014  . ESRD (end stage renal disease) on dialysis     [place],Dialysis- T,Th,S  . Acute on chronic diastolic heart failure [id number]  . Severe mitral regurgitation 03/23/2014    by TEE  . Positive blood culture 11/27/2013    MSSA  . Positive blood culture 02/24/2014    MSSA  . DVT of upper extremity (deep vein thrombosis) 03/04/2014    Right arm  . Knee pain, left 03/21/2014  . Acute combined systolic and diastolic congestive heart failure 03/21/2014  . Chronic diastolic congestive heart failure   . 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 Septic embolism to left lower extremity 03/25/2014  . 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 The aneurysm measures 1.4 x 1.1 cm which is  significantly larger than the 4.5 mm parent vessel.   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 S/P minimally invasive mitral valve repair 04/05/2014    Complex valvuloplasty including autologous pericardial patch repair of large perforation of posterior leaflet due to endocarditis with 28 mm Sorin Memo 3D ring annuloplasty via right mini thoracotomy approach   Past Surgical History  Procedure Laterality Date  . Av fistula placement Left ?2005    forearm   . Shuntogram Left November 04, 2011  . Patch angioplasty Left 07/23/2013    Procedure: PATCH ANGIOPLASTY- LEFT RADIOCEPHALIC ARTERIOVENOUS FISTULA;  Surgeon: Angelia Mould, MD;  Location: [place];  Service: Vascular;  Laterality: Left;  . Tee without cardioversion N/A 03/24/2014    Procedure: TRANSESOPHAGEAL ECHOCARDIOGRAM (TEE);  Surgeon: Larey Dresser, MD;  Location: [facility] ENDOSCOPY;  Service: Cardiovascular;  Laterality: N/A;  . Embolectomy Left 03/25/2014    Procedure: EMBOLECTOMY left popliteal;  Surgeon: Rosetta Posner, MD;  Location: [place];  Service: Vascular;  Laterality: Left;  . Femoral-popliteal bypass graft Left 03/25/2014    Procedure: Left Femoral- Below Knee Popliteal Bypass Graft;  Surgeon: Rosetta Posner, MD;  Location: [place];  Service: Vascular;  Laterality: Left;  . Mitral valve repair Right 04/05/2014    Procedure: MINIMALLY INVASIVE MITRAL VALVE REPAIR (MVR);  Surgeon: Rexene Alberts, MD;  Location: [place];  Service: Open Heart Surgery;  Laterality: Right;  . Intraoperative transesophageal echocardiogram N/A 04/05/2014    Procedure: INTRAOPERATIVE TRANSESOPHAGEAL ECHOCARDIOGRAM;  Surgeon: Rexene Alberts, MD;  Location: [place];  Service: Open  Heart Surgery;  Laterality: N/A;  . Mitral valve replacement Right 04/05/2014    Procedure: Bring back MINIMALLY INVASIVE MITRAL VALVE (MV) REPLACEMENT - Reexploration for bleeding;  Surgeon: Rexene Alberts, MD;  Location: [place];  Service: Open Heart Surgery;  Laterality: Right;  . Left and right heart catheterization with coronary angiogram N/A 03/28/2014    Procedure: LEFT AND RIGHT HEART CATHETERIZATION WITH CORONARY ANGIOGRAM;  Surgeon: Larey Dresser, MD;  Location: [facility] CATH LAB;  Service: Cardiovascular;  Laterality: N/A;   Family History  Problem Relation Age of Onset  . Diabetes Mother   . Hypertension Mother   . Hypertension Father   . Hypertension Sister   . Hypertension Brother    Social History  Substance Use Topics  . Smoking status: Never Smoker   . Smokeless tobacco: Never Used  . Alcohol Use: No    Review of Systems 10/14 systems reviewed and are negative other than those stated in the HPI   Allergies  Review of patient's allergies indicates no known allergies.  Home Medications   Prior to Admission medications   Medication Sig Start Date End Date Taking? Authorizing Provider  amiodarone (PACERONE) 200 MG tablet Take 200 mg by mouth daily.   Yes Historical Provider, MD  cinacalcet (SENSIPAR) 60 MG tablet Take 180 mg by mouth daily.    Yes Historical Provider, MD  lisinopril (PRINIVIL,ZESTRIL) 20 MG tablet Take 20 mg by mouth daily.   Yes Historical Provider, MD  SENSIPAR 90 MG tablet Take 180 mg by mouth daily. Take two 90 mg tablets at Lunch time 11/10/14  Yes Historical Provider, MD  sevelamer carbonate (RENVELA) 800 MG tablet Take 2 tablets (1,600 mg total) by mouth 3 (three) times daily with meals. Patient taking differently: Take 2,400 mg by mouth 3 (three) times daily with meals.  04/15/14  Yes Coolidge Breeze, PA-C  warfarin (COUMADIN) 5 MG tablet Take as directed by [facility] Clinic Patient taking differently: Take 7.5-10 mg by mouth daily at 6 PM. Takes 7.5mg  on sun only takes 10mg  all other days 01/05/15  Yes Larey Dresser, MD  carvedilol (COREG) 6.25 MG tablet Take 1 tablet (6.25 mg total) by mouth 2 (two) times daily. Patient not taking: Reported on 04/05/2015 06/13/14   Larey Dresser, MD  darbepoetin [facility]) 100 MCG/0.5ML SOLN injection Inject 0.5 mLs (100 mcg total) into the vein every Saturday with hemodialysis. Patient not taking: Reported on 08/09/2014 04/15/14   Coolidge Breeze, PA-C  HYDROcodone-acetaminophen (NORCO/VICODIN) 5-325 MG per tablet Take 2 tablets by mouth every 4 (four) hours as needed. 03/19/15   Leonard Schwartz, MD   BP 140/86 mmHg  Pulse 84  Temp(Src) 99.4 F (37.4 C) (Oral)  Resp 22  Ht 5\' 11"  (1.803 m)  Wt 230 lb (104.327 kg)  BMI 32.09 kg/m2  SpO2 100% Physical Exam Physical Exam  Nursing note and vitals reviewed. Constitutional: Non-toxic and in no acute distress Head: Normocephalic and atraumatic.  Mouth/Throat: Oropharynx is clear. Mucous membranes dry.  Neck: Normal range of motion. Neck supple.  Cardiovascular: Normal rate and regular rhythm.  No edema. No clear murmur appreciated but heart sounds distant secondary to large body habitus. Pulmonary/Chest: Effort normal and breath sounds normal. No conversational dyspnea. Abdominal: Soft. There is no tenderness. There is no rebound and no guarding.  Musculoskeletal: Normal range of motion.  Neurological: Alert, no facial droop, fluent speech, moves all extremities symmetrically Skin: Skin is warm and dry. No lesions, nodules,  rash. Psychiatric: Cooperative  ED Course  Procedures (including critical care time) Labs Review Labs Reviewed  BASIC METABOLIC PANEL - Abnormal; Notable for the following:    Sodium 134 (*)    Chloride 92 (*)    Glucose, Bld 102 (*)    BUN 23 (*)    Creatinine, Ser 10.90 (*)    GFR calc non Af Amer 5 (*)    GFR calc Af Amer 6 (*)    All other components within normal limits  CBC - Abnormal; Notable for the following:    WBC 11.5 (*)    RBC 2.79 (*)    Hemoglobin 8.4 (*)    HCT 25.6 (*)    RDW 19.5 (*)    All other components within normal limits  BRAIN NATRIURETIC PEPTIDE - Abnormal; Notable for the following:    B Natriuretic Peptide 1562.8  (*)    All other components within normal limits  PROTIME-INR - Abnormal; Notable for the following:    Prothrombin Time 35.3 (*)    INR 3.63 (*)    All other components within normal limits  CULTURE, BLOOD (ROUTINE X 2)  CULTURE, BLOOD (ROUTINE X 2)  CULTURE, BLOOD (SINGLE)  I-STAT TROPOININ, ED  I-STAT CG4 LACTIC ACID, ED    Imaging Review Ct Chest W Contrast  04/05/2015   CLINICAL DATA:  Cough. Weakness. Anorexia for 2.5 weeks. Prior mitral valve replacement.  EXAM: CT CHEST WITH CONTRAST  TECHNIQUE: Multidetector CT imaging of the chest was performed during intravenous contrast administration.  CONTRAST:  25mL ISOVUE-300 IOPAMIDOL (ISOVUE-300) INJECTION 61%  COMPARISON:  03/22/2014 chest CT angiogram. 03/19/2015 chest radiograph.  FINDINGS: Mediastinum/Nodes: Mitral valve prosthesis appears well positioned. Mild cardiomegaly, decreased. No pericardial fluid/thickening. There is atherosclerosis of the thoracic aorta, the great vessels of the mediastinum and the coronary arteries, including calcified atherosclerotic plaque in the left anterior descending, left circumflex and right coronary arteries. Great vessels are normal in course and caliber. No central pulmonary emboli. There is a partially calcified 1.5 cm hypodense posterior left thyroid lobe nodule, not appreciably changed. Normal esophagus. No axillary lymphadenopathy. Stable mildly enlarged 1.4 cm upper right paratracheal node (series 3/image 11). Stable mildly enlarged 1.5 cm subcarinal node (3/32). Stable aortopulmonary window lymphadenopathy, with the largest node measuring 1.9 cm (3/22), stable. There are mildly enlarged hilar nodes bilaterally, including a 1.5 cm right hilar node (3/20) and a 1.1 cm left hilar node (3/29), stable.  Lungs/Pleura: No pneumothorax. There are calcified pleural plaques in the mid and basilar right pleural space with trace right pleural effusion/pleural thickening, decreased. One of the right  hemidiaphragmatic calcified pleural plaques is new (series 4/image 39) . No left pleural effusion. There is patchy ground-glass opacity throughout both lungs, slightly less prominent in the interval. There is a 1.5 cm new cavitary nodule in the peripheral left upper lobe (series 4/image 18). There is a new subsolid 1.6 x 1.4 cm medial right upper lobe pulmonary nodule (4/22). There are several additional sub cm sub solid new pulmonary nodules throughout both lungs (at least 10).  Upper abdomen: Multiple subcentimeter calcified gallstones are seen layering in the nondistended non thick walled gallbladder. No biliary ductal dilatation. Partially visualized is a polycystic left kidney. There is an indeterminate incompletely characterized and partially visualized exophytic hypodense mass in the lateral upper left kidney measuring at least 5.1 x 5.0 cm (series 3/image 66), which was not apparent on the 03/24/2014 CT abdomen study.  Musculoskeletal: No aggressive appearing focal osseous lesions.  There is bilateral gynecomastia, asymmetric to the right, increased in the interval. Mild degenerative changes in the thoracic spine.  IMPRESSION: 1. Several (at least 10) new subsolid pulmonary nodules scattered throughout both lungs, largest 1.6 cm in the right upper lobe, including a cavitary 1.5 cm left upper lobe pulmonary nodule. Differential considerations include septic pulmonary emboli, fungal pneumonia and less likely pulmonary metastases or tuberculosis. Consider short-term follow-up post treatment chest CT in 4-6 weeks. 2. Mild cardiomegaly with patchy ground-glass opacity throughout both lungs, both which are decreased compared to the 03/22/2014 chest CT study, favor improved mild pulmonary edema. 3. Progressive calcified pleural plaques in the right pleural space. Trace right pleural effusion/thickening, decreased. 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 4. Stable nonspecific mediastinal  and bilateral hilar lymphadenopathy. 5. Incompletely visualized indeterminate hypodense mass in the left upper quadrant of the abdomen, which appears to arise from the lateral upper left kidney, measures at least 5.1 cm in size and appears new since the 03/24/2014 CT abdomen study. Recommend further evaluation with renal mass protocol CT or MRI of the abdomen with and without intravenous contrast. 6. Atherosclerosis, including three-vessel coronary artery disease. Please note that although the presence of coronary artery calcium documents the presence of coronary artery disease, the severity of this disease and any potential stenosis cannot be assessed on this non-gated CT examination. Assessment for potential risk factor modification, dietary therapy or pharmacologic therapy may be warranted, if clinically indicated. 7. Cholelithiasis. These results were called by telephone at the time of interpretation on 04/05/2015 at 5:42 pm to Dr. Posey Pronto, who verbally acknowledged these results.   Electronically Signed   By: Ilona Sorrel M.D.   On: 04/05/2015 17:42   I have personally reviewed and evaluated these images and lab results as part of my medical decision-making.   EKG Interpretation   Date/Time:  Wednesday April 05 2015 20:32:14 EDT Ventricular Rate:  89 PR Interval:  168 QRS Duration: 98 QT Interval:  422 QTC Calculation: 513 R Axis:   128 Text Interpretation:  Normal sinus rhythm Incomplete right bundle branch  block Possible Right ventricular hypertrophy T wave abnormality, consider  anterior ischemia Prolonged QT Abnormal ECG No significant change since  last tracing Confirmed by LIU MD, Hinton Dyer [id number]) on 04/05/2015 11:21:53 PM      MDM   Final diagnoses:  Endocarditis  Hypoxia  Cough    In short, this a 49 year old male with history of endocarditis status post mitral valve replacement and end-stage renal disease on hemodialysis who presents with 2 weeks productive cough, night  sweats, and shortness of breath. He has a 2 L oxygen requirement here for hypoxia on room air to 88%. He otherwise is in no significant respiratory distress, and is able to speak in full sentences. He goes for dialysis tomorrow, and does not appear fluid overloaded. Remainder of exam is otherwise unremarkable. CT chest from out patient visualized, and reviewed. Concerning for endocarditis, especially in the setting of  receiving hemodialysis. Blood cultures are obtained and he just started on vancomycin and Zosyn. Basic blood work overall reveals mild leukocytosis, normal lactate, and no significant end organ damage. Currently does not require urgent dialysis. Discussed with [facility] hospitalist and admitted for ongoing workup and management.    Forde Dandy, MD 04/05/15 2325

## 2015-04-06 ENCOUNTER — Encounter (HOSPITAL_COMMUNITY): Payer: Self-pay | Admitting: General Practice

## 2015-04-06 ENCOUNTER — Inpatient Hospital Stay (HOSPITAL_COMMUNITY): Payer: Medicare Other

## 2015-04-06 DIAGNOSIS — Z7901 Long term (current) use of anticoagulants: Secondary | ICD-10-CM

## 2015-04-06 DIAGNOSIS — I352 Nonrheumatic aortic (valve) stenosis with insufficiency: Secondary | ICD-10-CM

## 2015-04-06 DIAGNOSIS — D689 Coagulation defect, unspecified: Secondary | ICD-10-CM

## 2015-04-06 DIAGNOSIS — D631 Anemia in chronic kidney disease: Secondary | ICD-10-CM | POA: Diagnosis present

## 2015-04-06 DIAGNOSIS — N186 End stage renal disease: Secondary | ICD-10-CM

## 2015-04-06 DIAGNOSIS — Z954 Presence of other heart-valve replacement: Secondary | ICD-10-CM

## 2015-04-06 DIAGNOSIS — R918 Other nonspecific abnormal finding of lung field: Secondary | ICD-10-CM | POA: Insufficient documentation

## 2015-04-06 DIAGNOSIS — Z8619 Personal history of other infectious and parasitic diseases: Secondary | ICD-10-CM

## 2015-04-06 DIAGNOSIS — J984 Other disorders of lung: Secondary | ICD-10-CM

## 2015-04-06 DIAGNOSIS — Z952 Presence of prosthetic heart valve: Secondary | ICD-10-CM

## 2015-04-06 DIAGNOSIS — N189 Chronic kidney disease, unspecified: Secondary | ICD-10-CM

## 2015-04-06 DIAGNOSIS — Z8679 Personal history of other diseases of the circulatory system: Secondary | ICD-10-CM

## 2015-04-06 DIAGNOSIS — Z992 Dependence on renal dialysis: Secondary | ICD-10-CM

## 2015-04-06 DIAGNOSIS — N2889 Other specified disorders of kidney and ureter: Secondary | ICD-10-CM

## 2015-04-06 LAB — TYPE AND SCREEN
ABO/RH(D): B POS
Antibody Screen: NEGATIVE
Unit division: 0
Unit division: 0

## 2015-04-06 LAB — COMPREHENSIVE METABOLIC PANEL
ALBUMIN: 2.5 g/dL — AB (ref 3.5–5.0)
ALT: 9 U/L — AB (ref 17–63)
AST: 14 U/L — ABNORMAL LOW (ref 15–41)
Alkaline Phosphatase: 54 U/L (ref 38–126)
Anion gap: 13 (ref 5–15)
BUN: 26 mg/dL — ABNORMAL HIGH (ref 6–20)
CO2: 30 mmol/L (ref 22–32)
CREATININE: 11.71 mg/dL — AB (ref 0.61–1.24)
Calcium: 9.2 mg/dL (ref 8.9–10.3)
Chloride: 89 mmol/L — ABNORMAL LOW (ref 101–111)
GFR calc non Af Amer: 4 mL/min — ABNORMAL LOW (ref >60)
GFR, EST AFRICAN AMERICAN: 5 mL/min — AB (ref >60)
Glucose, Bld: 99 mg/dL (ref 65–99)
Potassium: 4.4 mmol/L (ref 3.5–5.1)
SODIUM: 132 mmol/L — AB (ref 135–145)
Total Bilirubin: 1.5 mg/dL — ABNORMAL HIGH (ref 0.3–1.2)
Total Protein: 6.8 g/dL (ref 6.5–8.1)

## 2015-04-06 LAB — CBC WITH DIFFERENTIAL/PLATELET
Basophils Absolute: 0 10*3/uL (ref 0.0–0.1)
Basophils Relative: 0 %
EOS ABS: 0.2 10*3/uL (ref 0.0–0.7)
Eosinophils Relative: 2 %
HEMATOCRIT: 23.9 % — AB (ref 39.0–52.0)
HEMOGLOBIN: 8 g/dL — AB (ref 13.0–17.0)
LYMPHS ABS: 1.5 10*3/uL (ref 0.7–4.0)
Lymphocytes Relative: 14 %
MCH: 30.7 pg (ref 26.0–34.0)
MCHC: 33.5 g/dL (ref 30.0–36.0)
MCV: 91.6 fL (ref 78.0–100.0)
MONOS PCT: 7 %
Monocytes Absolute: 0.7 10*3/uL (ref 0.1–1.0)
NEUTROS ABS: 8.6 10*3/uL — AB (ref 1.7–7.7)
NEUTROS PCT: 77 %
Platelets: 313 10*3/uL (ref 150–400)
RBC: 2.61 MIL/uL — ABNORMAL LOW (ref 4.22–5.81)
RDW: 19.8 % — ABNORMAL HIGH (ref 11.5–15.5)
WBC: 11 10*3/uL — ABNORMAL HIGH (ref 4.0–10.5)

## 2015-04-06 LAB — APTT: aPTT: 70 seconds — ABNORMAL HIGH (ref 24–37)

## 2015-04-06 LAB — PROTIME-INR
INR: 3.56 — ABNORMAL HIGH (ref 0.00–1.49)
INR: >10 (ref 0.00–1.49)
Prothrombin Time: 34.8 seconds — ABNORMAL HIGH (ref 11.6–15.2)
Prothrombin Time: >90 seconds — ABNORMAL HIGH (ref 11.6–15.2)

## 2015-04-06 LAB — PROCALCITONIN: Procalcitonin: 5.22 ng/mL

## 2015-04-06 LAB — SEDIMENTATION RATE: Sed Rate: 126 mm/hr — ABNORMAL HIGH (ref 0–16)

## 2015-04-06 LAB — HIV ANTIBODY (ROUTINE TESTING W REFLEX): HIV Screen 4th Generation wRfx: NONREACTIVE

## 2015-04-06 LAB — FIBRINOGEN: FIBRINOGEN: 673 mg/dL — AB (ref 204–475)

## 2015-04-06 LAB — STREP PNEUMONIAE URINARY ANTIGEN: Strep Pneumo Urinary Antigen: NEGATIVE

## 2015-04-06 LAB — PHOSPHORUS: PHOSPHORUS: 7 mg/dL — AB (ref 2.5–4.6)

## 2015-04-06 LAB — TECHNOLOGIST SMEAR REVIEW

## 2015-04-06 LAB — C-REACTIVE PROTEIN: CRP: 24 mg/dL — ABNORMAL HIGH (ref <1.0)

## 2015-04-06 MED ORDER — CARVEDILOL 6.25 MG PO TABS: 6.2500 mg | ORAL_TABLET | Freq: Two times a day (BID) | ORAL | Status: DC

## 2015-04-06 MED ORDER — SEVELAMER CARBONATE 800 MG PO TABS
2400.0000 mg | ORAL_TABLET | Freq: Three times a day (TID) | ORAL | Status: DC
  Administered 2015-04-06 – 2015-04-09 (×7): 2400 mg via ORAL
  Administered 2015-04-10: 800 mg via ORAL
  Administered 2015-04-12 – 2015-04-14 (×4): 2400 mg via ORAL
  Filled 2015-04-06 (×15): qty 3

## 2015-04-06 MED ORDER — VITAMIN K1 10 MG/ML IJ SOLN
10.0000 mg | Freq: Once | INTRAMUSCULAR | Status: DC
  Filled 2015-04-06: qty 1

## 2015-04-06 MED ORDER — CINACALCET HCL 30 MG PO TABS: 180.0000 mg | ORAL_TABLET | Freq: Every day | ORAL | Status: DC

## 2015-04-06 MED ORDER — LISINOPRIL 20 MG PO TABS
20.0000 mg | ORAL_TABLET | Freq: Every day | ORAL | Status: DC
  Administered 2015-04-06 – 2015-04-12 (×7): 20 mg via ORAL
  Filled 2015-04-06 (×8): qty 1

## 2015-04-06 MED ORDER — AMIODARONE HCL 200 MG PO TABS
200.0000 mg | ORAL_TABLET | Freq: Every day | ORAL | Status: DC
  Administered 2015-04-06 – 2015-04-14 (×9): 200 mg via ORAL
  Filled 2015-04-06 (×9): qty 1

## 2015-04-06 MED ORDER — WARFARIN - PHARMACIST DOSING INPATIENT
Freq: Every day | Status: DC
  Administered 2015-04-10: 18:00:00

## 2015-04-06 MED ORDER — IOHEXOL 300 MG/ML  SOLN
100.0000 mL | Freq: Once | INTRAMUSCULAR | Status: AC | PRN
  Administered 2015-04-06: 100 mL via INTRAVENOUS

## 2015-04-06 MED ORDER — VANCOMYCIN HCL IN DEXTROSE 1-5 GM/200ML-% IV SOLN
1000.0000 mg | INTRAVENOUS | Status: DC
  Administered 2015-04-07 – 2015-04-13 (×4): 1000 mg via INTRAVENOUS
  Filled 2015-04-06 (×6): qty 200

## 2015-04-06 MED ORDER — CARVEDILOL 6.25 MG PO TABS
6.2500 mg | ORAL_TABLET | Freq: Two times a day (BID) | ORAL | Status: DC
  Administered 2015-04-06 – 2015-04-14 (×13): 6.25 mg via ORAL
  Filled 2015-04-06 (×14): qty 1

## 2015-04-06 MED ORDER — RENA-VITE PO TABS
1.0000 | ORAL_TABLET | Freq: Every day | ORAL | Status: DC
  Administered 2015-04-07 – 2015-04-13 (×8): 1 via ORAL
  Filled 2015-04-06 (×9): qty 1

## 2015-04-06 MED ORDER — DARBEPOETIN ALFA 150 MCG/0.3ML IJ SOSY
150.0000 ug | PREFILLED_SYRINGE | INTRAMUSCULAR | Status: DC
  Administered 2015-04-07 – 2015-04-13 (×2): 150 ug via INTRAVENOUS
  Filled 2015-04-06 (×2): qty 0.3

## 2015-04-06 MED ORDER — CEFAZOLIN SODIUM-DEXTROSE 2-3 GM-% IV SOLR
2.0000 g | INTRAVENOUS | Status: DC
  Administered 2015-04-08 – 2015-04-13 (×3): 2 g via INTRAVENOUS
  Filled 2015-04-06 (×6): qty 50

## 2015-04-06 MED ORDER — DEXTROSE 5 % IV SOLN
2.0000 g | INTRAVENOUS | Status: DC
  Administered 2015-04-06: 2 g via INTRAVENOUS
  Filled 2015-04-06 (×2): qty 2

## 2015-04-06 MED ORDER — CALCITRIOL 0.5 MCG PO CAPS: 2.0000 ug | ORAL_CAPSULE | ORAL | Status: DC

## 2015-04-06 MED ORDER — VANCOMYCIN HCL IN DEXTROSE 1-5 GM/200ML-% IV SOLN
1000.0000 mg | Freq: Once | INTRAVENOUS | Status: AC
  Administered 2015-04-06: 1000 mg via INTRAVENOUS
  Filled 2015-04-06: qty 200

## 2015-04-06 MED ORDER — ENOXAPARIN SODIUM 40 MG/0.4ML ~~LOC~~ SOLN: 40.0000 mg | SUBCUTANEOUS | Status: DC

## 2015-04-06 MED ORDER — CALCITRIOL 0.5 MCG PO CAPS: 2.0000 ug | ORAL_CAPSULE | Freq: Every day | ORAL | Status: DC

## 2015-04-06 MED ORDER — PHYTONADIONE 5 MG PO TABS
5.0000 mg | ORAL_TABLET | Freq: Once | ORAL | Status: AC
  Administered 2015-04-06: 5 mg via ORAL
  Filled 2015-04-06: qty 1

## 2015-04-06 NOTE — Progress Notes (Addendum)
ANTIBIOTIC CONSULT NOTE - INITIAL  Pharmacy Consult for Timothy Palmer and Vancocin Indication: suspected endocarditis  No Known Allergies  Patient Measurements: Height: 5\' 11"  (180.3 cm) Weight: 230 lb (104.327 kg) IBW/kg (Calculated) : 75.3  Vital Signs: Temp: 99.4 F (37.4 C) (09/28 2030) Temp Source: Oral (09/28 2030) BP: 144/86 mmHg (09/29 0000) Pulse Rate: 80 (09/29 0015)  Labs:  Recent Labs  04/05/15 2049  WBC 11.5*  HGB 8.4*  PLT 287  CREATININE 10.90*   Estimated Creatinine Clearance: 10.3 mL/min (by C-G formula based on Cr of 10.9).   Microbiology: Recent Results (from the past 720 hour(s))  Blood culture (routine x 2)     Status: None (Preliminary result)   Collection Time: 04/05/15 10:26 PM  Result Value Ref Range Status   Specimen Description BLOOD RIGHT ARM  Final   Special Requests   Final    Immunocompromised BOTTLES DRAWN AEROBIC AND ANAEROBIC   Culture PENDING  Incomplete   Report Status PENDING  Incomplete    Medical History: Past Medical History  Diagnosis Date  . Hypertension   . Peripheral vascular disease   . Pneumonia 2014  . ESRD (end stage renal disease) on dialysis     [place],Dialysis- T,Th,S  . Acute on chronic diastolic heart failure [id number]  . Severe mitral regurgitation 03/23/2014    by TEE  . Positive blood culture 11/27/2013    MSSA  . Positive blood culture 02/24/2014    MSSA  . DVT of upper extremity (deep vein thrombosis) 03/04/2014    Right arm  . Knee pain, left 03/21/2014  . Acute combined systolic and diastolic congestive heart failure 03/21/2014  . Chronic diastolic congestive heart failure   . 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 Septic embolism to left lower extremity 03/25/2014  . 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 The aneurysm measures 1.4 x 1.1 cm which is  significantly larger than the 4.5 mm parent vessel.   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 S/P minimally invasive mitral valve repair 04/05/2014    Complex valvuloplasty including autologous pericardial patch repair of large perforation of posterior leaflet due to endocarditis with 28 mm Sorin Memo 3D ring annuloplasty via right mini thoracotomy approach     Assessment: 49yo male w/ h/o MSSA bacterial endocarditis was sent to ED after outpt CT showing pulmonary nodules including cavitary lesions concerning for recurrent endocarditis, to begin IV ABX.  Goal of Therapy:  Pre-HD vanc level 15-25  Plan:  Rec'd vanc 1g and Zosyn 3.375g IV in ED; will give additional vancomycin 1000mg  to complete load of 2g followed by 1000mg  after each HD and start Fortaz 2g now and on HD days and monitor CBC, Cx, levels prn.  Wynona Neat, PharmD, BCPS  04/06/2015,12:34 AM   ADDENDUM: To continue Coumadin for PAF/DVT/AVR; current INR above goal at 3.56.  Will hold Coumadin for now and monitor INR to resume and adjust doses. Wynona Neat, PharmD, BCPS 04/06/2015 4:35 AM

## 2015-04-06 NOTE — Progress Notes (Signed)
Went to do pts hemodialysis Tx in his neg. Pressure room. Sinks in room not able to adapt tho the hemodialysis machine. Other neg pressure rooms in use. House coverage informed of the situation and states they are working to resolve the issue.

## 2015-04-06 NOTE — Progress Notes (Signed)
  Echocardiogram 2D Echocardiogram has been performed.  [name] 04/06/2015, 3:21 PM

## 2015-04-06 NOTE — Progress Notes (Addendum)
PROGRESS NOTE  Timothy Palmer [id number] DOB: [birth year] DOA: 04/05/2015 PCP: Louis Meckel, MD  Brief history 49 year old male with history of ESRD, hypertension, MSSA bacteremia with mitral valve endocarditis, Atrial fibrillation, hypertension presented with 2 weeks of worsening cough with yellow-green sputum as well as subjective fevers and chills. The patient had an abnormal chest x-ray in outpatient setting. As result CT of the chest with intravenous contrast was obtained. 04/05/2015 CT of the chest revealed bilateral mediastinal and hilar lymphadenopathy as well as improving bilateral patchy ground glass opacities. There was a new left upper lobe 1.5 cm cavitary nodule. As result, the patient was advised to come to the hospital for further workup.  Notably, the patient had MSSA bacteremia back in May 2015 and August 2015. 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 He subsequently underwent a below the knee left popliteal bypass and embolectomy. The patient completed 6 weeks of cefazolin after his mitral valve replacement.  Pt states he last smoked marijuana 2 weeks ago and has remote hx of tobacco for which he only smoked 2 years over 15 yrs ago. Assessment/Plan: Cavitary lung nodule -Patient has bilateral mediastinal and hilar lymphadenopathy -Given the patient's clinical history concerning for septic emboli -Other considerations include atypical mycobacterial infection, malignancy, and fungal infection -Consult pulmonary--spoke with Dr. Elsworth Soho -Continue respiratory isolation--sputum for AFB -Quantiferon TB -Follow blood cultures -Discontinue ceftazidime -Continue vancomycin  -Start cefazolin -sputum for AFB ESRD    -Patient nephrology consultation  -Dialysis on Tuesday, Thursday, Saturdays  Status post mitral valve replacement  -Status post mitral valve replacement--04/05/2014 secondary to endocarditis with history of MSSA bacteremia  -Repeat echocardiogram  Coagulopathy  -INR >10 -vitamin K x 1 -recheck INR in am -likely due to acute infection -check fibrinogen, peripheral smear -hold coumadin Renal Mass -CT abd/pelvis with renal mass protocol Paroxysmal atrial fibrillation  -This occurred postoperatively  -Warfarin on hold as discussed above  -Continue amiodarone History of MSSA mitral valve endocarditis (native valve) -Finished 6 weeks of cefazolin after mitral valve replacement  HTN -continue coreg and lisinopril   Total time 60 min Family Communication:   Pt at beside Disposition Plan:   Home when medically stable       Procedures/Studies: Dg Chest 2 View  03/19/2015   CLINICAL DATA:  Chest pain  EXAM: CHEST  2 VIEW  COMPARISON:  05/30/2014  FINDINGS: Stable mild cardiomegaly and vascular pedicle widening in this patient post mitral valve repair.  Pleural and parenchymal scarring on the right with volume loss which is presumably postoperative.  Subtle small density in the peripheral left upper chest near the posterior fifth rib is likely summation shadows with the scapula. There is no edema, consolidation, effusion, or pneumothorax.  No acute osseous finding to explain chest pain.  IMPRESSION: 1. No active cardiopulmonary disease. 2. Stable pleural parenchymal scarring on the right.   Electronically Signed   By: Monte Fantasia M.D.   On: 03/19/2015 17:58   Ct Chest W Contrast  04/05/2015   CLINICAL DATA:  Cough. Weakness. Anorexia for 2.5 weeks. Prior mitral valve replacement.  EXAM: CT CHEST WITH CONTRAST  TECHNIQUE: Multidetector CT imaging of the chest was performed during intravenous contrast administration.  CONTRAST:  3mL ISOVUE-300  IOPAMIDOL (ISOVUE-300) INJECTION 61%   COMPARISON:  03/22/2014 chest CT angiogram. 03/19/2015 chest radiograph.  FINDINGS: Mediastinum/Nodes: Mitral valve prosthesis appears well positioned. Mild cardiomegaly, decreased. No pericardial fluid/thickening. There is atherosclerosis of the thoracic aorta, the great vessels of the mediastinum and the coronary arteries, including calcified atherosclerotic plaque in the left anterior descending, left circumflex and right coronary arteries. Great vessels are normal in course and caliber. No central pulmonary emboli. There is a partially calcified 1.5 cm hypodense posterior left thyroid lobe nodule, not appreciably changed. Normal esophagus. No axillary lymphadenopathy. Stable mildly enlarged 1.4 cm upper right paratracheal node (series 3/image 11). Stable mildly enlarged 1.5 cm subcarinal node (3/32). Stable aortopulmonary window lymphadenopathy, with the largest node measuring 1.9 cm (3/22), stable. There are mildly enlarged hilar nodes bilaterally, including a 1.5 cm right hilar node (3/20) and a 1.1 cm left hilar node (3/29), stable.  Lungs/Pleura: No pneumothorax. There are calcified pleural plaques in the mid and basilar right pleural space with trace right pleural effusion/pleural thickening, decreased. One of the right hemidiaphragmatic calcified pleural plaques is new (series 4/image 39) . No left pleural effusion. There is patchy ground-glass opacity throughout both lungs, slightly less prominent in the interval. There is a 1.5 cm new cavitary nodule in the peripheral left upper lobe (series 4/image 18). There is a new subsolid 1.6 x 1.4 cm medial right upper lobe pulmonary nodule (4/22). There are several additional sub cm sub solid new pulmonary nodules throughout both lungs (at least 10).  Upper abdomen: Multiple subcentimeter calcified gallstones are seen layering in the nondistended non thick walled gallbladder. No biliary ductal dilatation. Partially visualized is a polycystic left kidney. There is  an indeterminate incompletely characterized and partially visualized exophytic hypodense mass in the lateral upper left kidney measuring at least 5.1 x 5.0 cm (series 3/image 66), which was not apparent on the 03/24/2014 CT abdomen study.  Musculoskeletal: No aggressive appearing focal osseous lesions. There is bilateral gynecomastia, asymmetric to the right, increased in the interval. Mild degenerative changes in the thoracic spine.  IMPRESSION: 1. Several (at least 10) new subsolid pulmonary nodules scattered throughout both lungs, largest 1.6 cm in the right upper lobe, including a cavitary 1.5 cm left upper lobe pulmonary nodule. Differential considerations include septic pulmonary emboli, fungal pneumonia and less likely pulmonary metastases or tuberculosis. Consider short-term follow-up post treatment chest CT in 4-6 weeks. 2. Mild cardiomegaly with patchy ground-glass opacity throughout both lungs, both which are decreased compared to the 03/22/2014 chest CT study, favor improved mild pulmonary edema. 3. Progressive calcified pleural plaques in the right pleural space. Trace right pleural effusion/thickening, decreased. 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 4. Stable nonspecific mediastinal and bilateral hilar lymphadenopathy. 5. Incompletely visualized indeterminate hypodense mass in the left upper quadrant of the abdomen, which appears to arise from the lateral upper left kidney, measures at least 5.1 cm in size and appears new since the 03/24/2014 CT abdomen study. Recommend further evaluation with renal mass protocol CT or MRI of the abdomen with and without intravenous contrast. 6. Atherosclerosis, including three-vessel coronary artery disease. Please note that although the presence of coronary artery calcium documents the presence of coronary artery disease, the severity of this disease and any potential stenosis cannot be assessed on this non-gated CT examination.  Assessment for potential risk factor modification, dietary therapy or pharmacologic therapy may be warranted, if clinically indicated. 7. Cholelithiasis. These results were called by telephone at the time of interpretation on 04/05/2015  at 5:42 pm to Dr. Posey Pronto, who verbally acknowledged these results.   Electronically Signed   By: Ilona Sorrel M.D.   On: 04/05/2015 17:42         Subjective:  patient complains of a cough resulting in shortness of breath. Denies any nausea, vomiting, diarrhea, bowel pain, fevers, chills, headache, back pain. Denies any dysuria.  Objective: Filed Vitals:   04/06/15 0145 04/06/15 0200 04/06/15 0215 04/06/15 0250  BP:   126/71 142/86  Pulse: 76 77 75 79  Temp:    98.9 F (37.2 C)  TempSrc:    Oral  Resp: 31 32 34 20  Height:    5\' 11"  (1.803 m)  Weight:    104.962 kg (231 lb 6.4 oz)  SpO2: 100% 100% 100% 100%    Intake/Output Summary (Last 24 hours) at 04/06/15 0933 Last data filed at 04/06/15 0415  Gross per 24 hour  Intake      0 ml  Output      0 ml  Net      0 ml   Weight change:  Exam:   General:  Pt is alert, follows commands appropriately, not in acute distress  HEENT: No icterus, No thrush, No neck mass, [place]/AT  Cardiovascular: RRR, S1/S2, no rubs, no gallops  Respiratory: fine bibasilar crackles. No wheezing.   Abdomen: Soft/+BS, non tender, non distended, no guarding; no hepatosplenomegaly  Extremities: No edema, No lymphangitis, No petechiae, No rashes, no synovitis  Data Reviewed: Basic Metabolic Panel:  Recent Labs Lab 04/05/15 2049 04/06/15 0515  NA 134* 132*  K 4.2 4.4  CL 92* 89*  CO2 30 30  GLUCOSE 102* 99  BUN 23* 26*  CREATININE 10.90* 11.71*  CALCIUM 9.8 9.2   Liver Function Tests:  Recent Labs Lab 04/06/15 0515  AST 14*  ALT 9*  ALKPHOS 54  BILITOT 1.5*  PROT 6.8  ALBUMIN 2.5*   No results for input(s): LIPASE, AMYLASE in the last 168 hours. No results for input(s): AMMONIA in the last 168  hours. CBC:  Recent Labs Lab 04/05/15 2049 04/06/15 0515  WBC 11.5* 11.0*  NEUTROABS  --  8.6*  HGB 8.4* 8.0*  HCT 25.6* 23.9*  MCV 91.8 91.6  PLT 287 313   Cardiac Enzymes: No results for input(s): CKTOTAL, CKMB, CKMBINDEX, TROPONINI in the last 168 hours. BNP: Invalid input(s): POCBNP CBG: No results for input(s): GLUCAP in the last 168 hours.  Recent Results (from the past 240 hour(s))  Blood culture (routine x 2)     Status: None (Preliminary result)   Collection Time: 04/05/15 10:26 PM  Result Value Ref Range Status   Specimen Description BLOOD RIGHT ARM  Final   Special Requests   Final    Immunocompromised BOTTLES DRAWN AEROBIC AND ANAEROBIC   Culture PENDING  Incomplete   Report Status PENDING  Incomplete     Scheduled Meds: . amiodarone  200 mg Oral Daily  . calcitRIOL  2 mcg Oral Q T,Th,Sat-1800  . carvedilol  6.25 mg Oral BID WC  . cefTAZidime (FORTAZ)  IV  2 g Intravenous Q T,Th,Sat-1800  . cinacalcet  180 mg Oral Q lunch  . lisinopril  20 mg Oral Daily  . sevelamer carbonate  2,400 mg Oral TID WC  . vancomycin  1,000 mg Intravenous Q T,Th,Sa-HD  . Warfarin - Pharmacist Dosing Inpatient   Does not apply q1800   Continuous Infusions:    TAT, DAVID, DO  [facility] Pager [phone]  If 7PM-7AM, please contact night-coverage [email or web address] Password [facility] 04/06/2015, 9:33 AM   LOS: 1 day

## 2015-04-06 NOTE — Consult Note (Signed)
Name: Timothy Palmer MRN: [id number] DOB: Nov 14, 1965    ADMISSION DATE:  04/05/2015 CONSULTATION DATE:  9/29  REFERRING MD :  [facility]   CHIEF COMPLAINT:  Cavitary lung nodule   BRIEF PATIENT DESCRIPTION: 49yo with hx ESRD, HTN, chronic dCHF with hx mitral valve endocarditis with valve replacement 2015, Afib on coumadin.  Presented 9/28 with 2 weeks hx cough, SOB, night sweats, fever.  Abnormal CXR prompted CT chest which revealed cavitary lung lesion and PCCM consulted  SIGNIFICANT EVENTS    STUDIES:  CT chest 9/29>>> Several (at least 10) new subsolid pulmonary nodules scattered throughout both lungs, largest 1.6 cm in the right upper lobe, including a cavitary 1.5 cm left upper lobe pulmonary nodule.  Progressive calcified pleural plaques in the right pleural space.  Incompletely visualized indeterminate hypodense mass in the left upper quadrant of the abdomen, which appears to arise from the lateral upper left kidney, measures at least 5.1 cm in size and appears new since the 03/24/2014 CT abdomen study   HISTORY OF PRESENT ILLNESS:  48yo with hx ESRD (TTS), HTN, chronic dCHF with hx mitral valve endocarditis with valve replacement 2015, Afib on coumadin. He has been on dialysis for 11 years ever since he "lost his kidneys to dialysis". Course since that time had been fairly uncomplicated with the exception of long hospitalization in 2015 for bacterial endocarditis requiring mitral valve replacement and long course of ABX. He again presented 9/28 with 2 weeks hx cough, SOB, night sweats, fever.  Abnormal CXR prompted CT chest which revealed one small cavitary lung lesion, and several pulmonary nodules, including hilar and mediastinal LAN. He was started on broadd spectrum antibiotics and PCCM was consulted for further eval. Currentlly he voices complaints of cough productive for clear sputum, and generalized fatigue, fever/chills. He had some intermittent chest pain, but this has resolved. He  denies any SOB, hemoptysis, pleuritic chest pain. Denies edema, or unilateral weakness. Denies join pain.   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  has past surgical history that includes AV fistula placement (Left, ?2005); Shuntogram (Left, November 04, 2011); Patch angioplasty (Left, 07/23/2013); TEE without cardioversion (N/A, 03/24/2014); Embolectomy (Left, 03/25/2014); Femoral-popliteal Bypass Graft (Left, 03/25/2014); Mitral valve repair (Right, 04/05/2014); Intraoprative transesophageal echocardiogram (N/A, 04/05/2014); Mitral valve replacement (Right, 04/05/2014); and left and right heart catheterization with coronary angiogram (N/A, 03/28/2014). Prior to Admission medications   Medication Sig Start Date End Date Taking? Authorizing Provider  amiodarone (PACERONE) 200 MG tablet Take 200 mg by mouth daily.   Yes Historical Provider, MD  cinacalcet (SENSIPAR) 60 MG tablet Take 180 mg by mouth daily.    Yes Historical Provider, MD  lisinopril (PRINIVIL,ZESTRIL) 20 MG tablet Take 20 mg by mouth daily.   Yes Historical Provider, MD  SENSIPAR 90 MG tablet Take 180 mg by mouth  daily. Take two 90 mg tablets at [facility]  time 11/10/14  Yes Historical Provider, MD  sevelamer carbonate (RENVELA) 800 MG tablet Take 2 tablets (1,600 mg total) by mouth 3 (three) times daily with meals. Patient taking differently: Take 2,400 mg by mouth 3 (three) times daily with meals.  04/15/14  Yes Coolidge Breeze, PA-C  warfarin (COUMADIN) 5 MG tablet Take as directed by [facility] Patient taking differently: Take 7.5-10 mg by mouth daily at 6 PM. Takes 7.5mg  on sun only takes 10mg  all other days 01/05/15  Yes Larey Dresser, MD  carvedilol (COREG) 6.25 MG tablet Take 1 tablet (6.25 mg total) by mouth 2 (two) times daily. Patient not taking: Reported on 04/05/2015 06/13/14   Larey Dresser, MD  darbepoetin [facility]) 100 MCG/0.5ML SOLN injection Inject 0.5 mLs (100 mcg total) into the vein every Saturday with hemodialysis. Patient not taking: Reported on 08/09/2014 04/15/14   Coolidge Breeze, PA-C  HYDROcodone-acetaminophen (NORCO/VICODIN) 5-325 MG per tablet Take 2 tablets by mouth every 4 (four) hours as needed. 03/19/15   Colt Martelle Schwartz, MD   No Known Allergies  FAMILY HISTORY:  family history includes Diabetes in his mother; Hypertension in his brother, father, mother, and sister. SOCIAL HISTORY:  reports that he has never smoked. He has never used smokeless tobacco. He reports that he uses illicit drugs (Marijuana). He reports that he does not drink alcohol.  REVIEW OF SYSTEMS:   As per HPI - All other systems reviewed and were neg.    SUBJECTIVE:   VITAL SIGNS: Temp:  [98.1 F (36.7 C)-99.8 F (37.7 C)] 99.8 F (37.7 C) (09/29 0936) Pulse Rate:  [75-91] 88 (09/29 0936) Resp:  [16-34] 20 (09/29 0936) BP: (123-152)/(71-90) 130/78 mmHg (09/29 0936) SpO2:  [90 %-100 %] 99 % (09/29 0936) Weight:  [230 lb (104.327 kg)-231 lb 6.4 oz (104.962 kg)] 231 lb 6.4 oz (104.962 kg) (09/29 0250)  PHYSICAL EXAMINATION: General:  Obese male in NAD Neuro:  Alert, oriented, non-focal HEENT:   [place]/AT, PERRL, no JVD Cardiovascular:  RRR, 4/6 SEM 2nd ICS LSB Lungs:  Clear bilateral breath sounds Abdomen:  Soft, non-tender, non-distended Musculoskeletal:  No acute deformity or ROM limitation, no edema Skin:  Grossly intact, no redness or warmth of extremities    Recent Labs Lab 04/05/15 2049 04/06/15 0515  NA 134* 132*  K 4.2 4.4  CL 92* 89*  CO2 30 30  BUN 23* 26*  CREATININE 10.90* 11.71*  GLUCOSE 102* 99    Recent Labs Lab 04/05/15 2049 04/06/15 0515  HGB 8.4* 8.0*  HCT 25.6* 23.9*  WBC 11.5* 11.0*  PLT 287 313   Ct Chest W Contrast  04/05/2015   CLINICAL DATA:  Cough. Weakness. Anorexia for 2.5 weeks. Prior mitral valve replacement.  EXAM: CT CHEST WITH CONTRAST  TECHNIQUE: Multidetector CT imaging of the chest was performed during intravenous contrast administration.  CONTRAST:  52mL ISOVUE-300 IOPAMIDOL (ISOVUE-300) INJECTION 61%  COMPARISON:  03/22/2014 chest CT angiogram. 03/19/2015 chest radiograph.  FINDINGS: Mediastinum/Nodes: Mitral valve prosthesis appears well positioned. Mild cardiomegaly, decreased. No pericardial fluid/thickening. There is atherosclerosis of the thoracic aorta, the great vessels of the mediastinum and the coronary arteries, including calcified atherosclerotic plaque in the left anterior descending, left circumflex and right coronary arteries. Great vessels are normal in course and caliber. No central pulmonary emboli. There is a partially calcified 1.5 cm hypodense posterior left thyroid lobe nodule, not appreciably changed. Normal esophagus. No axillary lymphadenopathy. Stable mildly enlarged 1.4 cm upper right paratracheal node (series 3/image 11). Stable  mildly enlarged 1.5 cm subcarinal node (3/32). Stable aortopulmonary window lymphadenopathy, with the largest node measuring 1.9 cm (3/22), stable. There are mildly enlarged hilar nodes bilaterally, including a 1.5 cm right hilar node (3/20) and a 1.1 cm left hilar node (3/29), stable.   Lungs/Pleura: No pneumothorax. There are calcified pleural plaques in the mid and basilar right pleural space with trace right pleural effusion/pleural thickening, decreased. One of the right hemidiaphragmatic calcified pleural plaques is new (series 4/image 39) . No left pleural effusion. There is patchy ground-glass opacity throughout both lungs, slightly less prominent in the interval. There is a 1.5 cm new cavitary nodule in the peripheral left upper lobe (series 4/image 18). There is a new subsolid 1.6 x 1.4 cm medial right upper lobe pulmonary nodule (4/22). There are several additional sub cm sub solid new pulmonary nodules throughout both lungs (at least 10).  Upper abdomen: Multiple subcentimeter calcified gallstones are seen layering in the nondistended non thick walled gallbladder. No biliary ductal dilatation. Partially visualized is a polycystic left kidney. There is an indeterminate incompletely characterized and partially visualized exophytic hypodense mass in the lateral upper left kidney measuring at least 5.1 x 5.0 cm (series 3/image 66), which was not apparent on the 03/24/2014 CT abdomen study.  Musculoskeletal: No aggressive appearing focal osseous lesions. There is bilateral gynecomastia, asymmetric to the right, increased in the interval. Mild degenerative changes in the thoracic spine.  IMPRESSION: 1. Several (at least 10) new subsolid pulmonary nodules scattered throughout both lungs, largest 1.6 cm in the right upper lobe, including a cavitary 1.5 cm left upper lobe pulmonary nodule. Differential considerations include septic pulmonary emboli, fungal pneumonia and less likely pulmonary metastases or tuberculosis. Consider short-term follow-up post treatment chest CT in 4-6 weeks. 2. Mild cardiomegaly with patchy ground-glass opacity throughout both lungs, both which are decreased compared to the 03/22/2014 chest CT study, favor improved mild pulmonary edema. 3. Progressive calcified pleural  plaques in the right pleural space. Trace right pleural effusion/thickening, decreased. 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 4. Stable nonspecific mediastinal and bilateral hilar lymphadenopathy. 5. Incompletely visualized indeterminate hypodense mass in the left upper quadrant of the abdomen, which appears to arise from the lateral upper left kidney, measures at least 5.1 cm in size and appears new since the 03/24/2014 CT abdomen study. Recommend further evaluation with renal mass protocol CT or MRI of the abdomen with and without intravenous contrast. 6. Atherosclerosis, including three-vessel coronary artery disease. Please note that although the presence of coronary artery calcium documents the presence of coronary artery disease, the severity of this disease and any potential stenosis cannot be assessed on this non-gated CT examination. Assessment for potential risk factor modification, dietary therapy or pharmacologic therapy may be warranted, if clinically indicated. 7. Cholelithiasis. These results were called by telephone at the time of interpretation on 04/05/2015 at 5:42 pm to Dr. Posey Pronto, who verbally acknowledged these results.   Electronically Signed   By: Ilona Sorrel M.D.   On: 04/05/2015 17:42    ASSESSMENT / PLAN:  Cavitary lung lesion: etiology unclear at this point. Most likely bacterial infection, concern anaerobe. Possibly TB, however, radiographic findings not exactly as would be expected. ddx includes fungal infection, septic emboli, malignancy.   Plan: Continue broad spectrum antibiotics     Cefazolin 9/29 >>>    Vancomycin 9/29 >>> Follow Cultures     Blood 9/28 >>>     Sputum 9/28 >>>     AFB Sputum  >>>  Trend WBC and fever curve If no improvement with ABX may benefit from bronchoscopic evaluation for BAL and possible lymph node biopsy   Possible L renal mass  Mediastinal and bilateral hilar lymphadenopathy : Suspect this is reactive to acute  infection, however could represent malignancy in setting of possible renal mass on CT.  Plan: Consider MRI renals to better evaluate  Plan: Repeat CT scan in one month   Marijuana abuse    ATTENDING NOTE: I have personally reviewed patient's available data, including medical history, events of note, physical examination and test results as part of my evaluation. I have discussed with resident/NP and other careteam providers such as pharmacist, RN and RRT & co-ordinated with consultants.  I reviewed prior history of MSSA bacteremia and mitral endocarditis requiring mitral valve replacement in 03/2014. He presents with 2 weeks of cough, mild shortness of breath and night sweats including one or 2 episodes of fever. CT chest reviewed which shows tiny pulmonary nodules largest 1.6 cm in the right upper lobe and a cavitary 1.5 cm lesion in the left upper lobe. Left renal mass was also noted. I discussed these findings with the patient and showed him the images.  Impression/plan-could be infectious, have to consider metastatic renal cell cancer. Mediastinal lymphadenopathy appears to be chronic dating back to 03/2014   Recommend-await culture data, this could perhaps be as a repeat episode of staph bacteremia. Suggest- proceed with imaging kidneys with MRI  If Biopsy required, would consider outpatient PET scan before deciding on a site of biopsy  Rest per NP/medical resident whose note is outlined above and that I agree with and edited in full.   Kara Mead MD. Shade Flood. [facility] Pager [phone] If no response call [phone]

## 2015-04-06 NOTE — Consult Note (Signed)
[facility]  Date of Admission:  04/05/2015  Date of Consult:  04/06/2015  Reason for Consult: Embolic CVA Referring Physician: Tat  Impression/Recommendation Lung nodules 5 cm L renal mass Would- Check BCx  Would continue vanco/ancef while Cx pending  Consider MRI of kidney, lung Bx?  Discussed with pulm, his lesions are not amenable to bronch.   Prev Endocarditis Prev MSSA Bacteremia  Repeat BCx  ESRD  F/u electrolytes.   Recheck HIV test   Thank you so much for this interesting consult,   Bobby Rumpf (pager) [phone] [email or web address]  Timothy Palmer is an 49 y.o. male.  HPI: 49 yo M with hx ESRD x 12 yrs, of MSSA bacteremia in May 2015. He developed a second episode of bacteremia in August 2015. 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 Admission blood cultures on 9/15 were negative. He underwent embolectomy followed by saphenous vein bypass of his left popliteal embolus. He also underwent mitral valve replacement on September 29. Mitral valve Gram stain and cultures were negative. He completed Ancef with HD 05-2014.  He returns 9-29 with 2 weeks of cough, fatigue, night sweats and f/c. States his fever were worse during HD on 9-27.  He was found to have an abn CXR and then on CT to have 1. Several (at least 10) new subsolid pulmonary nodules scattered throughout both lungs, largest 1.6 cm in the right upper lobe, including a cavitary 1.5 cm left upper lobe pulmonary nodule. Differential considerations include septic pulmonary emboli, fungal pneumonia and less likely pulmonary metastases or tuberculosis. Consider short-term follow-up post treatment chest CT in 4-6 weeks. 2. Mild cardiomegaly with patchy ground-glass opacity throughout both  lungs, both which are decreased compared to the 03/22/2014 chest CT study, favor improved mild pulmonary edema. 3. Progressive calcified pleural plaques in the right pleural space. Trace right pleural effusion/thickening, decreased. 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 4. Stable nonspecific mediastinal and bilateral hilar lymphadenopathy. 5. Incompletely visualized indeterminate hypodense mass in the left upper quadrant of the abdomen, which appears to arise from the lateral upper left kidney, measures at least 5.1 cm in size and appears new since the 03/24/2014 CT abdomen study. Recommend further evaluation with renal mass protocol CT or MRI of the abdomen with and without intravenous contrast. 6. Atherosclerosis, including three-vessel coronary artery disease. Please note that although the presence of coronary artery calcium documents the presence of coronary artery disease, the severity of this disease and any potential stenosis cannot be assessed on this non-gated CT examination. Assessment for potential risk factor modification, dietary therapy or pharmacologic therapy may be warranted, if clinically indicated. 7. Cholelithiasis.    His hx is also notable for cocaine use. He has never lived outside [place].  He denies TB exposures. No homelessness, no incarceration. No prev TB tests.   Past Medical History  Diagnosis Date  . Hypertension   . Peripheral vascular disease   . Pneumonia 2014  . ESRD (end stage renal disease) on dialysis     [place],Dialysis- T,Th,S  . Acute on chronic diastolic heart failure 11/23/8414  . Severe mitral regurgitation 03/23/2014    by TEE  . Positive blood culture 11/27/2013    MSSA  . Positive blood culture 02/24/2014    MSSA  . DVT of upper extremity (deep  vein thrombosis) 03/04/2014    Right arm  . Knee pain, left 03/21/2014  . Acute combined systolic and diastolic congestive heart failure 03/21/2014  . Chronic  diastolic congestive heart failure   . 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 Septic embolism to left lower extremity 03/25/2014  . 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 The aneurysm measures 1.4 x 1.1 cm which is  significantly larger than the 4.5 mm parent vessel.   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 S/P minimally invasive mitral valve repair 04/05/2014    Complex valvuloplasty including autologous pericardial patch repair of large perforation of posterior leaflet due to endocarditis with 28 mm Sorin Memo 3D ring annuloplasty via right mini thoracotomy approach    Past Surgical History  Procedure Laterality Date  . Av fistula placement Left ?2005    forearm   . Shuntogram Left November 04, 2011  . Patch angioplasty Left 07/23/2013    Procedure: PATCH ANGIOPLASTY- LEFT RADIOCEPHALIC ARTERIOVENOUS FISTULA;  Surgeon: Angelia Mould, MD;  Location: [facility];  Service: Vascular;  Laterality: Left;  . Tee without cardioversion N/A 03/24/2014    Procedure: TRANSESOPHAGEAL ECHOCARDIOGRAM (TEE);  Surgeon: Larey Dresser, MD;  Location: [facility] ENDOSCOPY;  Service: Cardiovascular;  Laterality: N/A;  . Embolectomy Left 03/25/2014    Procedure: EMBOLECTOMY left popliteal;  Surgeon: Rosetta Posner, MD;  Location: [place] [facility];  Service: Vascular;  Laterality: Left;  . Femoral-popliteal bypass graft Left 03/25/2014    Procedure: Left Femoral- Below Knee Popliteal Bypass Graft;  Surgeon: Rosetta Posner, MD;  Location: [facility];  Service: Vascular;  Laterality: Left;  . Mitral valve repair Right 04/05/2014    Procedure: MINIMALLY INVASIVE MITRAL VALVE REPAIR (MVR);  Surgeon: Rexene Alberts, MD;  Location: [facility];  Service: Open Heart Surgery;  Laterality: Right;  . Intraoperative  transesophageal echocardiogram N/A 04/05/2014    Procedure: INTRAOPERATIVE TRANSESOPHAGEAL ECHOCARDIOGRAM;  Surgeon: Rexene Alberts, MD;  Location: [place] [facility];  Service: Open Heart Surgery;  Laterality: N/A;  . Mitral valve replacement Right 04/05/2014    Procedure: Bring back MINIMALLY INVASIVE MITRAL VALVE (MV) REPLACEMENT - Reexploration for bleeding;  Surgeon: Rexene Alberts, MD;  Location: [place];  Service: Open Heart Surgery;  Laterality: Right;  . Left and right heart catheterization with coronary angiogram N/A 03/28/2014    Procedure: LEFT AND RIGHT HEART CATHETERIZATION WITH CORONARY ANGIOGRAM;  Surgeon: Larey Dresser, MD;  Location: [facility] CATH LAB;  Service: Cardiovascular;  Laterality: N/A;     No Known Allergies  Medications:  Scheduled: . amiodarone  200 mg Oral Daily  . calcitRIOL  2 mcg Oral Q T,Th,Sat-1800  . carvedilol  6.25 mg Oral BID WC  .  ceFAZolin (ANCEF) IV  2 g Intravenous Q T,Th,Sa-HD  . cinacalcet  180 mg Oral Q lunch  . darbepoetin (ARANESP) injection - DIALYSIS  150 mcg Intravenous Q Thu-HD  . lisinopril  20 mg Oral Daily  . multivitamin  1 tablet Oral QHS  . phytonadione  5 mg Oral Once  . sevelamer carbonate  2,400 mg Oral TID WC  . vancomycin  1,000 mg Intravenous Q T,Th,Sa-HD  . Warfarin - Pharmacist Dosing Inpatient   Does not apply q1800    Abtx:  Anti-infectives    Start  Dose/Rate Route Frequency Ordered Stop   04/06/15 1200  vancomycin (VANCOCIN) IVPB 1000 mg/200 mL premix     1,000 mg 200 mL/hr over 60 Minutes Intravenous Every T-Th-Sa (Hemodialysis) 04/06/15 0043     04/06/15 1200  ceFAZolin (ANCEF) IVPB 2 g/50 mL premix     2 g 100 mL/hr over 30 Minutes Intravenous Every T-Th-Sa (Hemodialysis) 04/06/15 1012     04/06/15 0045  vancomycin (VANCOCIN) IVPB 1000 mg/200 mL premix     1,000 mg 200 mL/hr over 60 Minutes Intravenous  Once 04/06/15 0043 04/06/15 0215   04/06/15 0045  cefTAZidime (FORTAZ) 2 g in dextrose 5 % 50 mL IVPB  Status:   Discontinued     2 g 100 mL/hr over 30 Minutes Intravenous Every T-Th-Sa (1800) 04/06/15 0043 04/06/15 1012   04/05/15 2215  vancomycin (VANCOCIN) IVPB 1000 mg/200 mL premix     1,000 mg 200 mL/hr over 60 Minutes Intravenous  Once 04/05/15 2203 04/06/15 0215   04/05/15 2215  piperacillin-tazobactam (ZOSYN) IVPB 3.375 g     3.375 g 100 mL/hr over 30 Minutes Intravenous  Once 04/05/15 2203 04/05/15 2303      Total days of antibiotics: 1 vanco/ancef          Social History:  reports that he has never smoked. He has never used smokeless tobacco. He reports that he uses illicit drugs (Marijuana). He reports that he does not drink alcohol.  Family History  Problem Relation Age of Onset  . Diabetes Mother   . Hypertension Mother   . Hypertension Father   . Hypertension Sister   . Hypertension Brother     General ROS: L UE graft- working poorly. + constipation.  Please see HPI. 12 point ROS o/w (-)  Blood pressure 130/78, pulse 88, temperature 99.8 F (37.7 C), temperature source Oral, resp. rate 20, height '5\' 11"'  (1.803 m), weight 104.962 kg (231 lb 6.4 oz), SpO2 99 %. General appearance: alert, cooperative and no distress Eyes: negative findings: pupils equal, round, reactive to light and accomodation Throat: normal findings: oropharynx pink & moist without lesions or evidence of thrush Neck: no adenopathy and supple, symmetrical, trachea midline Lungs: clear to auscultation bilaterally Heart: regular rate and rhythm and systolic murmur: systolic ejection 2/6, crescendo at 2nd right intercostal space Abdomen: normal findings: bowel sounds normal and soft, non-tender Extremities: non-pitting LE edema. L AVF- indurated, wound os on upper section. no d/c.    Results for orders placed or performed during the hospital encounter of 04/05/15 (from the past 48 hour(s))  I-stat troponin, ED     Status: None   Collection Time: 04/05/15  8:45 PM  Result Value Ref Range   Troponin i, poc  0.06 0.00 - 0.08 ng/mL   Comment 3            Comment: Due to the release kinetics of cTnI, a negative result within the first hours of the onset of symptoms does not rule out myocardial infarction with certainty. If myocardial infarction is still suspected, repeat the test at appropriate intervals.   Basic metabolic panel     Status: Abnormal   Collection Time: 04/05/15  8:49 PM  Result Value Ref Range   Sodium 134 (L) 135 - 145 mmol/L   Potassium 4.2 3.5 - 5.1 mmol/L   Chloride 92 (L) 101 - 111 mmol/L   CO2 30 22 - 32 mmol/L   Glucose, Bld 102 (H) 65 - 99 mg/dL   BUN 23 (H) 6 -  20 mg/dL   Creatinine, Ser 10.90 (H) 0.61 - 1.24 mg/dL   Calcium 9.8 8.9 - 10.3 mg/dL   GFR calc non Af Amer 5 (L) >60 mL/min   GFR calc Af Amer 6 (L) >60 mL/min    Comment: (NOTE) The eGFR has been calculated using the CKD EPI equation. This calculation has not been validated in all clinical situations. eGFR's persistently <60 mL/min signify possible Chronic Kidney Disease.    Anion gap 12 5 - 15  CBC     Status: Abnormal   Collection Time: 04/05/15  8:49 PM  Result Value Ref Range   WBC 11.5 (H) 4.0 - 10.5 K/uL   RBC 2.79 (L) 4.22 - 5.81 MIL/uL   Hemoglobin 8.4 (L) 13.0 - 17.0 g/dL   HCT 25.6 (L) 39.0 - 52.0 %   MCV 91.8 78.0 - 100.0 fL   MCH 30.1 26.0 - 34.0 pg   MCHC 32.8 30.0 - 36.0 g/dL   RDW 19.5 (H) 11.5 - 15.5 %   Platelets 287 150 - 400 K/uL  Brain natriuretic peptide     Status: Abnormal   Collection Time: 04/05/15  8:51 PM  Result Value Ref Range   B Natriuretic Peptide 1562.8 (H) 0.0 - 100.0 pg/mL  Protime-INR     Status: Abnormal   Collection Time: 04/05/15 10:16 PM  Result Value Ref Range   Prothrombin Time 35.3 (H) 11.6 - 15.2 seconds   INR 3.63 (H) 0.00 - 1.49  Blood culture (routine x 2)     Status: None (Preliminary result)   Collection Time: 04/05/15 10:26 PM  Result Value Ref Range   Specimen Description BLOOD RIGHT ARM    Special Requests      Immunocompromised  BOTTLES DRAWN AEROBIC AND ANAEROBIC   Culture PENDING    Report Status PENDING   Procalcitonin     Status: None   Collection Time: 04/05/15 10:33 PM  Result Value Ref Range   Procalcitonin 5.22 ng/mL    Comment:        Interpretation: PCT > 2 ng/mL: Systemic infection (sepsis) is likely, unless other causes are known. (NOTE)         ICU PCT Algorithm               Non ICU PCT Algorithm    ----------------------------     ------------------------------         PCT < 0.25 ng/mL                 PCT < 0.1 ng/mL     Stopping of antibiotics            Stopping of antibiotics       strongly encouraged.               strongly encouraged.    ----------------------------     ------------------------------       PCT level decrease by               PCT < 0.25 ng/mL       >= 80% from peak PCT       OR PCT 0.25 - 0.5 ng/mL          Stopping of antibiotics                                             encouraged.  Stopping of antibiotics           encouraged.    ----------------------------     ------------------------------       PCT level decrease by              PCT >= 0.25 ng/mL       < 80% from peak PCT        AND PCT >= 0.5 ng/mL            Continuing antibiotics                                               encouraged.       Continuing antibiotics            encouraged.    ----------------------------     ------------------------------     PCT level increase compared          PCT > 0.5 ng/mL         with peak PCT AND          PCT >= 0.5 ng/mL             Escalation of antibiotics                                          strongly encouraged.      Escalation of antibiotics        strongly encouraged.   Protime-INR     Status: Abnormal   Collection Time: 04/05/15 10:33 PM  Result Value Ref Range   Prothrombin Time 34.8 (H) 11.6 - 15.2 seconds   INR 3.56 (H) 0.00 - 1.49  APTT     Status: Abnormal   Collection Time: 04/05/15 10:33 PM  Result Value Ref Range   aPTT 70 (H) 24 - 37  seconds    Comment:        IF BASELINE aPTT IS ELEVATED, SUGGEST PATIENT RISK ASSESSMENT BE USED TO DETERMINE APPROPRIATE ANTICOAGULANT THERAPY.   I-Stat CG4 Lactic Acid, ED     Status: None   Collection Time: 04/05/15 10:37 PM  Result Value Ref Range   Lactic Acid, Venous 0.96 0.5 - 2.0 mmol/L  Comprehensive metabolic panel     Status: Abnormal   Collection Time: 04/06/15  5:15 AM  Result Value Ref Range   Sodium 132 (L) 135 - 145 mmol/L   Potassium 4.4 3.5 - 5.1 mmol/L   Chloride 89 (L) 101 - 111 mmol/L   CO2 30 22 - 32 mmol/L   Glucose, Bld 99 65 - 99 mg/dL   BUN 26 (H) 6 - 20 mg/dL   Creatinine, Ser 11.71 (H) 0.61 - 1.24 mg/dL   Calcium 9.2 8.9 - 10.3 mg/dL   Total Protein 6.8 6.5 - 8.1 g/dL   Albumin 2.5 (L) 3.5 - 5.0 g/dL   AST 14 (L) 15 - 41 U/L   ALT 9 (L) 17 - 63 U/L   Alkaline Phosphatase 54 38 - 126 U/L   Total Bilirubin 1.5 (H) 0.3 - 1.2 mg/dL   GFR calc non Af Amer 4 (L) >60 mL/min   GFR calc Af Amer 5 (L) >60 mL/min    Comment: (NOTE) The eGFR has been calculated using the CKD EPI equation. This calculation  has not been validated in all clinical situations. eGFR's persistently <60 mL/min signify possible Chronic Kidney Disease.    Anion gap 13 5 - 15  CBC WITH DIFFERENTIAL     Status: Abnormal   Collection Time: 04/06/15  5:15 AM  Result Value Ref Range   WBC 11.0 (H) 4.0 - 10.5 K/uL   RBC 2.61 (L) 4.22 - 5.81 MIL/uL   Hemoglobin 8.0 (L) 13.0 - 17.0 g/dL   HCT 23.9 (L) 39.0 - 52.0 %   MCV 91.6 78.0 - 100.0 fL   MCH 30.7 26.0 - 34.0 pg   MCHC 33.5 30.0 - 36.0 g/dL   RDW 19.8 (H) 11.5 - 15.5 %   Platelets 313 150 - 400 K/uL   Neutrophils Relative % 77 %   Neutro Abs 8.6 (H) 1.7 - 7.7 K/uL   Lymphocytes Relative 14 %   Lymphs Abs 1.5 0.7 - 4.0 K/uL   Monocytes Relative 7 %   Monocytes Absolute 0.7 0.1 - 1.0 K/uL   Eosinophils Relative 2 %   Eosinophils Absolute 0.2 0.0 - 0.7 K/uL   Basophils Relative 0 %   Basophils Absolute 0.0 0.0 - 0.1 K/uL    C-reactive protein     Status: Abnormal   Collection Time: 04/06/15  8:20 AM  Result Value Ref Range   CRP 24.0 (H) <1.0 mg/dL  Protime-INR     Status: Abnormal   Collection Time: 04/06/15  8:20 AM  Result Value Ref Range   Prothrombin Time >90.0 (H) 11.6 - 15.2 seconds   INR >10.00 (HH) 0.00 - 1.49    Comment: REPEATED TO VERIFY CRITICAL RESULT CALLED TO, READ BACK BY AND VERIFIED WITH: Q HOLLOWAY,RN AT 0932 04/06/15 BY K BARR   Phosphorus     Status: Abnormal   Collection Time: 04/06/15  9:30 AM  Result Value Ref Range   Phosphorus 7.0 (H) 2.5 - 4.6 mg/dL      Component Value Date/Time   SDES BLOOD RIGHT ARM 04/05/2015 2226   SPECREQUEST  04/05/2015 2226    Immunocompromised BOTTLES DRAWN AEROBIC AND ANAEROBIC   CULT PENDING 04/05/2015 2226   REPTSTATUS PENDING 04/05/2015 2226   Ct Chest W Contrast  04/05/2015   CLINICAL DATA:  Cough. Weakness. Anorexia for 2.5 weeks. Prior mitral valve replacement.  EXAM: CT CHEST WITH CONTRAST  TECHNIQUE: Multidetector CT imaging of the chest was performed during intravenous contrast administration.  CONTRAST:  44m ISOVUE-300 IOPAMIDOL (ISOVUE-300) INJECTION 61%  COMPARISON:  03/22/2014 chest CT angiogram. 03/19/2015 chest radiograph.  FINDINGS: Mediastinum/Nodes: Mitral valve prosthesis appears well positioned. Mild cardiomegaly, decreased. No pericardial fluid/thickening. There is atherosclerosis of the thoracic aorta, the great vessels of the mediastinum and the coronary arteries, including calcified atherosclerotic plaque in the left anterior descending, left circumflex and right coronary arteries. Great vessels are normal in course and caliber. No central pulmonary emboli. There is a partially calcified 1.5 cm hypodense posterior left thyroid lobe nodule, not appreciably changed. Normal esophagus. No axillary lymphadenopathy. Stable mildly enlarged 1.4 cm upper right paratracheal node (series 3/image 11). Stable mildly enlarged 1.5 cm subcarinal  node (3/32). Stable aortopulmonary window lymphadenopathy, with the largest node measuring 1.9 cm (3/22), stable. There are mildly enlarged hilar nodes bilaterally, including a 1.5 cm right hilar node (3/20) and a 1.1 cm left hilar node (3/29), stable.  Lungs/Pleura: No pneumothorax. There are calcified pleural plaques in the mid and basilar right pleural space with trace right pleural effusion/pleural thickening, decreased. One of the right  hemidiaphragmatic calcified pleural plaques is new (series 4/image 39) . No left pleural effusion. There is patchy ground-glass opacity throughout both lungs, slightly less prominent in the interval. There is a 1.5 cm new cavitary nodule in the peripheral left upper lobe (series 4/image 18). There is a new subsolid 1.6 x 1.4 cm medial right upper lobe pulmonary nodule (4/22). There are several additional sub cm sub solid new pulmonary nodules throughout both lungs (at least 10).  Upper abdomen: Multiple subcentimeter calcified gallstones are seen layering in the nondistended non thick walled gallbladder. No biliary ductal dilatation. Partially visualized is a polycystic left kidney. There is an indeterminate incompletely characterized and partially visualized exophytic hypodense mass in the lateral upper left kidney measuring at least 5.1 x 5.0 cm (series 3/image 66), which was not apparent on the 03/24/2014 CT abdomen study.  Musculoskeletal: No aggressive appearing focal osseous lesions. There is bilateral gynecomastia, asymmetric to the right, increased in the interval. Mild degenerative changes in the thoracic spine.  IMPRESSION: 1. Several (at least 10) new subsolid pulmonary nodules scattered throughout both lungs, largest 1.6 cm in the right upper lobe, including a cavitary 1.5 cm left upper lobe pulmonary nodule. Differential considerations include septic pulmonary emboli, fungal pneumonia and less likely pulmonary metastases or tuberculosis. Consider short-term  follow-up post treatment chest CT in 4-6 weeks. 2. Mild cardiomegaly with patchy ground-glass opacity throughout both lungs, both which are decreased compared to the 03/22/2014 chest CT study, favor improved mild pulmonary edema. 3. Progressive calcified pleural plaques in the right pleural space. Trace right pleural effusion/thickening, decreased. 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 4. Stable nonspecific mediastinal and bilateral hilar lymphadenopathy. 5. Incompletely visualized indeterminate hypodense mass in the left upper quadrant of the abdomen, which appears to arise from the lateral upper left kidney, measures at least 5.1 cm in size and appears new since the 03/24/2014 CT abdomen study. Recommend further evaluation with renal mass protocol CT or MRI of the abdomen with and without intravenous contrast. 6. Atherosclerosis, including three-vessel coronary artery disease. Please note that although the presence of coronary artery calcium documents the presence of coronary artery disease, the severity of this disease and any potential stenosis cannot be assessed on this non-gated CT examination. Assessment for potential risk factor modification, dietary therapy or pharmacologic therapy may be warranted, if clinically indicated. 7. Cholelithiasis. These results were called by telephone at the time of interpretation on 04/05/2015 at 5:42 pm to Dr. Posey Pronto, who verbally acknowledged these results.   Electronically Signed   By: Ilona Sorrel M.D.   On: 04/05/2015 17:42   Recent Results (from the past 240 hour(s))  Blood culture (routine x 2)     Status: None (Preliminary result)   Collection Time: 04/05/15 10:26 PM  Result Value Ref Range Status   Specimen Description BLOOD RIGHT ARM  Final   Special Requests   Final    Immunocompromised BOTTLES DRAWN AEROBIC AND ANAEROBIC   Culture PENDING  Incomplete   Report Status PENDING  Incomplete      04/06/2015, 11:12 AM     LOS: 1  day    Records and images were personally reviewed where available.

## 2015-04-06 NOTE — H&P (Signed)
[facility] History and Physical  Patient: Timothy Palmer  MRN: [id number]  DOB: July 12, 1965  DOS: the patient was seen and examined on 04/06/2015 PCP: Louis Meckel, MD  Referring physician: Dr. Oleta Mouse Chief Complaint: Cough and shortness of breath  HPI: Timothy Palmer is a 49 y.o. 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 fib. The patient is presenting with complains of 2 weeks of cough which is progressively worsening. Patient also has yellowish to greenish sputum production without any blood. Patient complains of generalized fatigue as well as night sweats as well as fever and chills. He denies having any chest pain or abdominal pain no nausea no vomiting no diarrhea no constipation. Patient is compliant with his hemodialysis as well as other medications. Patient was seen by his nephrologist as an outpatient and I chest x-ray was checked. Later on the next admission was requested to come for a CT scan of the chest due to abnormality of the chest x-ray and a CT scan of the chest that did show some cavitary lung lesion and therefore the patient was brought here for further workup.  The patient is coming from home.  At his baseline ambulates without support And is independent for most of his ADL; manages his medication on his own.  Review of Systems: as mentioned in the history of present illness.  A comprehensive review of the other systems is negative.  Past Medical History  Diagnosis Date  . Hypertension   . Peripheral vascular disease   . Pneumonia 2014  . ESRD (end stage renal disease) on dialysis     [place],Dialysis- T,Th,S  . Acute on chronic diastolic heart failure 12/12/3708  . Severe mitral regurgitation 03/23/2014    by TEE  . Positive blood culture  11/27/2013    MSSA  . Positive blood culture 02/24/2014    MSSA  . DVT of upper extremity (deep vein thrombosis) 03/04/2014    Right arm  . Knee pain, left 03/21/2014  . Acute combined systolic and diastolic congestive heart failure 03/21/2014  . Chronic diastolic congestive heart failure   . 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 Septic embolism to left lower extremity 03/25/2014  . 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 The aneurysm measures 1.4 x 1.1 cm which is  significantly larger than the 4.5 mm parent vessel.   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 S/P minimally invasive mitral valve repair 04/05/2014    Complex valvuloplasty including autologous pericardial patch repair of large perforation of posterior leaflet due to endocarditis with 28 mm Sorin Memo 3D ring annuloplasty via right mini thoracotomy approach   Past Surgical History  Procedure Laterality Date  . Av fistula placement Left ?2005    forearm   . Shuntogram Left November 04, 2011  . Patch angioplasty Left 07/23/2013    Procedure: PATCH ANGIOPLASTY- LEFT RADIOCEPHALIC ARTERIOVENOUS FISTULA;  Surgeon: Angelia Mould, MD;  Location: [place];  Service: Vascular;  Laterality: Left;  . Tee without cardioversion N/A 03/24/2014    Procedure: TRANSESOPHAGEAL ECHOCARDIOGRAM (TEE);  Surgeon: Larey Dresser, MD;  Location: [place];  Service: Cardiovascular;  Laterality: N/A;  .  Embolectomy Left 03/25/2014    Procedure: EMBOLECTOMY left popliteal;  Surgeon: Rosetta Posner, MD;  Location: [place];  Service: Vascular;  Laterality: Left;  . Femoral-popliteal bypass graft Left 03/25/2014    Procedure: Left Femoral- Below Knee Popliteal Bypass Graft;  Surgeon: Rosetta Posner, MD;  Location: [place];  Service:  Vascular;  Laterality: Left;  . Mitral valve repair Right 04/05/2014    Procedure: MINIMALLY INVASIVE MITRAL VALVE REPAIR (MVR);  Surgeon: Rexene Alberts, MD;  Location: [place];  Service: Open Heart Surgery;  Laterality: Right;  . Intraoperative transesophageal echocardiogram N/A 04/05/2014    Procedure: INTRAOPERATIVE TRANSESOPHAGEAL ECHOCARDIOGRAM;  Surgeon: Rexene Alberts, MD;  Location: [place];  Service: Open Heart Surgery;  Laterality: N/A;  . Mitral valve replacement Right 04/05/2014    Procedure: Bring back MINIMALLY INVASIVE MITRAL VALVE (MV) REPLACEMENT - Reexploration for bleeding;  Surgeon: Rexene Alberts, MD;  Location: [place];  Service: Open Heart Surgery;  Laterality: Right;  . Left and right heart catheterization with coronary angiogram N/A 03/28/2014    Procedure: LEFT AND RIGHT HEART CATHETERIZATION WITH CORONARY ANGIOGRAM;  Surgeon: Larey Dresser, MD;  Location: [facility] C[facility];  Service: Cardiovascular;  Laterality: N/A;   Social History:  reports that he has never smoked. He has never used smokeless tobacco. He reports that he uses illicit drugs (Marijuana). He reports that he does not drink alcohol.  No Known Allergies  Family History  Problem Relation Age of Onset  . Diabetes Mother   . Hypertension Mother   . Hypertension Father   . Hypertension Sister   . Hypertension Brother     Prior to Admission medications   Medication Sig Start Date End Date Taking? Authorizing Provider  amiodarone (PACERONE) 200 MG tablet Take 200 mg by mouth daily.   Yes Historical Provider, MD  cinacalcet (SENSIPAR) 60 MG tablet Take 180 mg by mouth daily.    Yes Historical Provider, MD  lisinopril (PRINIVIL,ZESTRIL) 20 MG tablet Take 20 mg by mouth daily.   Yes Historical Provider, MD  SENSIPAR 90 MG tablet Take 180 mg by mouth daily. Take two 90 mg tablets at Lunch time 11/10/14  Yes Historical Provider, MD  sevelamer carbonate (RENVELA) 800 MG tablet Take 2 tablets (1,600 mg total) by mouth 3  (three) times daily with meals. Patient taking differently: Take 2,400 mg by mouth 3 (three) times daily with meals.  04/15/14  Yes Coolidge Breeze, PA-C  warfarin (COUMADIN) 5 MG tablet Take as directed by [facility] Patient taking differently: Take 7.5-10 mg by mouth daily at 6 PM. Takes 7.20m on sun only takes 153mall other days 01/05/15  Yes DaLarey DresserMD  carvedilol (COREG) 6.25 MG tablet Take 1 tablet (6.25 mg total) by mouth 2 (two) times daily. Patient not taking: Reported on 04/05/2015 06/13/14   DaLarey DresserMD  darbepoetin ([facility]100 MCG/0.5ML SOLN injection Inject 0.5 mLs (100 mcg total) into the vein every Saturday with hemodialysis. Patient not taking: Reported on 08/09/2014 04/15/14   GiCoolidge BreezePA-C  HYDROcodone-acetaminophen (NORCO/VICODIN) 5-325 MG per tablet Take 2 tablets by mouth every 4 (four) hours as needed. 03/19/15   RoLeonard SchwartzMD    Physical Exam: Filed Vitals:   04/06/15 0145 04/06/15 0200 04/06/15 0215 04/06/15 0250  BP:   126/71 142/86  Pulse: 76 77 75 79  Temp:    98.9 F (37.2 C)  TempSrc:    Oral  Resp: 31 32  34 20  Height:    '5\' 11"'  (1.803 m)  Weight:    104.962 kg (231 lb 6.4 oz)  SpO2: 100% 100% 100% 100%    General: Alert, Awake and Oriented to Time, Place and Person. Appear in mild distress Eyes: PERRL ENT: Oral Mucosa clear moist. Neck: no JVD Cardiovascular: S1 and S2 Present, no Murmur, Peripheral Pulses Present Respiratory: Bilateral Air entry equal and Decreased,  Faint basal Crackles, no wheezes Abdomen: Bowel Sound present, Soft and no tenderness Skin: no Rash Extremities: no Pedal edema, no calf tenderness Neurologic: Grossly no focal neuro deficit. Labs on Admission:  CBC:  Recent Labs Lab 04/05/15 2049  WBC 11.5*  HGB 8.4*  HCT 25.6*  MCV 91.8  PLT 287    CMP     Component Value Date/Time   NA 134* 04/05/2015 2049   K 4.2 04/05/2015 2049   CL 92* 04/05/2015 2049   CO2 30 04/05/2015 2049    GLUCOSE 102* 04/05/2015 2049   BUN 23* 04/05/2015 2049   CREATININE 10.90* 04/05/2015 2049   CALCIUM 9.8 04/05/2015 2049   PROT 7.7 06/13/2014 1036   ALBUMIN 2.9* 06/13/2014 1036   AST 13 06/13/2014 1036   ALT 7 06/13/2014 1036   ALKPHOS 84 06/13/2014 1036   BILITOT 0.4 06/13/2014 1036   GFRNONAA 5* 04/05/2015 2049   GFRAA 6* 04/05/2015 2049    No results for input(s): CKTOTAL, CKMB, CKMBINDEX, TROPONINI in the last 168 hours. BNP (last 3 results)  Recent Labs  04/05/15 2051  BNP 1562.8*    ProBNP (last 3 results)  Recent Labs  04/12/14 0457  PROBNP 73428.7*     Radiological Exams on Admission: Ct Chest W Contrast  04/05/2015   CLINICAL DATA:  Cough. Weakness. Anorexia for 2.5 weeks. Prior mitral valve replacement.  EXAM: CT CHEST WITH CONTRAST  TECHNIQUE: Multidetector CT imaging of the chest was performed during intravenous contrast administration.  CONTRAST:  26m ISOVUE-300 IOPAMIDOL (ISOVUE-300) INJECTION 61%  COMPARISON:  03/22/2014 chest CT angiogram. 03/19/2015 chest radiograph.  FINDINGS: Mediastinum/Nodes: Mitral valve prosthesis appears well positioned. Mild cardiomegaly, decreased. No pericardial fluid/thickening. There is atherosclerosis of the thoracic aorta, the great vessels of the mediastinum and the coronary arteries, including calcified atherosclerotic plaque in the left anterior descending, left circumflex and right coronary arteries. Great vessels are normal in course and caliber. No central pulmonary emboli. There is a partially calcified 1.5 cm hypodense posterior left thyroid lobe nodule, not appreciably changed. Normal esophagus. No axillary lymphadenopathy. Stable mildly enlarged 1.4 cm upper right paratracheal node (series 3/image 11). Stable mildly enlarged 1.5 cm subcarinal node (3/32). Stable aortopulmonary window lymphadenopathy, with the largest node measuring 1.9 cm (3/22), stable. There are mildly enlarged hilar nodes bilaterally, including a 1.5 cm  right hilar node (3/20) and a 1.1 cm left hilar node (3/29), stable.  Lungs/Pleura: No pneumothorax. There are calcified pleural plaques in the mid and basilar right pleural space with trace right pleural effusion/pleural thickening, decreased. One of the right hemidiaphragmatic calcified pleural plaques is new (series 4/image 39) . No left pleural effusion. There is patchy ground-glass opacity throughout both lungs, slightly less prominent in the interval. There is a 1.5 cm new cavitary nodule in the peripheral left upper lobe (series 4/image 18). There is a new subsolid 1.6 x 1.4 cm medial right upper lobe pulmonary nodule (4/22). There are several additional sub cm sub solid new pulmonary nodules throughout both lungs (at least 10).  Upper abdomen: Multiple subcentimeter calcified gallstones  are seen layering in the nondistended non thick walled gallbladder. No biliary ductal dilatation. Partially visualized is a polycystic left kidney. There is an indeterminate incompletely characterized and partially visualized exophytic hypodense mass in the lateral upper left kidney measuring at least 5.1 x 5.0 cm (series 3/image 66), which was not apparent on the 03/24/2014 CT abdomen study.  Musculoskeletal: No aggressive appearing focal osseous lesions. There is bilateral gynecomastia, asymmetric to the right, increased in the interval. Mild degenerative changes in the thoracic spine.  IMPRESSION: 1. Several (at least 10) new subsolid pulmonary nodules scattered throughout both lungs, largest 1.6 cm in the right upper lobe, including a cavitary 1.5 cm left upper lobe pulmonary nodule. Differential considerations include septic pulmonary emboli, fungal pneumonia and less likely pulmonary metastases or tuberculosis. Consider short-term follow-up post treatment chest CT in 4-6 weeks. 2. Mild cardiomegaly with patchy ground-glass opacity throughout both lungs, both which are decreased compared to the 03/22/2014 chest CT study,  favor improved mild pulmonary edema. 3. Progressive calcified pleural plaques in the right pleural space. Trace right pleural effusion/thickening, decreased. 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 4. Stable nonspecific mediastinal and bilateral hilar lymphadenopathy. 5. Incompletely visualized indeterminate hypodense mass in the left upper quadrant of the abdomen, which appears to arise from the lateral upper left kidney, measures at least 5.1 cm in size and appears new since the 03/24/2014 CT abdomen study. Recommend further evaluation with renal mass protocol CT or MRI of the abdomen with and without intravenous contrast. 6. Atherosclerosis, including three-vessel coronary artery disease. Please note that although the presence of coronary artery calcium documents the presence of coronary artery disease, the severity of this disease and any potential stenosis cannot be assessed on this non-gated CT examination. Assessment for potential risk factor modification, dietary therapy or pharmacologic therapy may be warranted, if clinically indicated. 7. Cholelithiasis. These results were called by telephone at the time of interpretation on 04/05/2015 at 5:42 pm to Dr. Posey Pronto, who verbally acknowledged these results.   Electronically Signed   By: Ilona Sorrel M.D.   On: 04/05/2015 17:42   Assessment/Plan 1. Endocarditis The patient is presenting with numbness of the fever night sweats and anorexia. CT scan shows cavitary lesion which is suspicious for a possible infectious nature. Although possibility of malignancy also cannot be ruled out. Less likely this could also represent tuberculosis. With this the patient will be admitted in the hospital. Admitted her broad-spectrum vancomycin and Fortaz. Infectious diseases may be consulted in the morning regarding further workup related to tuberculosis. The patient only has 3 blood cultures drawn. Would check ESR and CRP. I would also check  echocardiogram in the morning since the patient has significant risk factors for endocarditis.  2. End stage renal disease Nephrology will be consulted to continue his hemodialysis Tuesday Thursday Saturday. Next and continue home medication.  3  Hypertension Continuing home medication. At present stable.  4  Chronic diastolic congestive heart failure Continuing hemodialysis as scheduled.  5  Mitral valve replaced   Anticoagulant long-term use  Continuing warfarin as scheduled. Echo cardiogram in the morning to rule out endocarditis.  6  Anemia in chronic kidney disease Continuing EPO with hemodialysis.  Nutrition: Renal diet DVT Prophylaxis: subcutaneous Heparin  Advance goals of care discussion: Full code   Consults: Nephrology with hemodialysis  Disposition: Admitted as inpatient, telemetry unit.  Author: Berle Mull, MD [facility] Pager: [phone] 04/06/2015  If 7PM-7AM, please contact night-coverage [email or web address] Password [email or web address]

## 2015-04-06 NOTE — Progress Notes (Signed)
CRITICAL VALUE ALERT  Critical value received:  PT >90    INR>10  Date of notification:  04/06/2015  Time of notification:  0940  Critical value read back:YES  Nurse who received alert:  Sherene Sires  MD notified (1st page):   Dr. Shanon Brow Tat  [phone]  Time of first page:  [phone]  MD notified (2nd page):  Time of second page:  Responding MD:  Orson Eva  Time MD responded:  [phone]

## 2015-04-06 NOTE — Consult Note (Signed)
Timothy Palmer is an 49 y.o. male referred by Dr Tat   Chief Complaint: ESRD, Sec HPTH, Anemia HPI: 49yo BM with ESRD admitted after CT chest showed mult pulm nodules with 1 that was cavitary.  Has had a cough for few weeks which prompted evaluation.  Denies IV drug, "I just use weed".  Appetite has been decreased and feel like he has lost some weight.  Past Medical History  Diagnosis Date  . Hypertension   . Peripheral vascular disease   . Pneumonia 2014  . ESRD (end stage renal disease) on dialysis     [place],Dialysis- T,Th,S  . Acute on chronic diastolic heart failure [id number]  . Severe mitral regurgitation 03/23/2014    by TEE  . Positive blood culture 11/27/2013    MSSA  . Positive blood culture 02/24/2014    MSSA  . DVT of upper extremity (deep vein thrombosis) 03/04/2014    Right arm  . Knee pain, left 03/21/2014  . Acute combined systolic and diastolic congestive heart failure 03/21/2014  . Chronic diastolic congestive heart failure   . 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 Septic embolism to left lower extremity 03/25/2014  . 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 The aneurysm measures 1.4 x 1.1 cm which is  significantly larger than the 4.5 mm parent vessel.   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 S/P minimally invasive mitral valve repair 04/05/2014    Complex valvuloplasty including autologous pericardial patch repair of large perforation of posterior leaflet due to endocarditis with 28 mm Sorin Memo 3D ring annuloplasty via right mini thoracotomy approach    Past Surgical History  Procedure Laterality Date  . Av fistula placement Left ?2005    forearm   . Shuntogram Left November 04, 2011  . Patch angioplasty Left 07/23/2013    Procedure:  PATCH ANGIOPLASTY- LEFT RADIOCEPHALIC ARTERIOVENOUS FISTULA;  Surgeon: Angelia Mould, MD;  Location: [place] [facility];  Service: Vascular;  Laterality: Left;  . Tee without cardioversion N/A 03/24/2014    Procedure: TRANSESOPHAGEAL ECHOCARDIOGRAM (TEE);  Surgeon: Larey Dresser, MD;  Location: [facility] ENDOSCOPY;  Service: Cardiovascular;  Laterality: N/A;  . Embolectomy Left 03/25/2014    Procedure: EMBOLECTOMY left popliteal;  Surgeon: Rosetta Posner, MD;  Location: [place];  Service: Vascular;  Laterality: Left;  . Femoral-popliteal bypass graft Left 03/25/2014    Procedure: Left Femoral- Below Knee Popliteal Bypass Graft;  Surgeon: Rosetta Posner, MD;  Location: [place];  Service: Vascular;  Laterality: Left;  . Mitral valve repair Right 04/05/2014    Procedure: MINIMALLY INVASIVE MITRAL VALVE REPAIR (MVR);  Surgeon: Rexene Alberts, MD;  Location: [place];  Service: Open Heart Surgery;  Laterality: Right;  . Intraoperative transesophageal echocardiogram N/A 04/05/2014    Procedure: INTRAOPERATIVE TRANSESOPHAGEAL ECHOCARDIOGRAM;  Surgeon: Rexene Alberts, MD;  Location: [place];  Service: Open Heart Surgery;  Laterality: N/A;  . Mitral valve replacement Right 04/05/2014    Procedure: Bring back MINIMALLY INVASIVE MITRAL VALVE (MV) REPLACEMENT - Reexploration for bleeding;  Surgeon: Rexene Alberts, MD;  Location: [place];  Service: Open Heart Surgery;  Laterality: Right;  . Left and right heart catheterization with coronary angiogram N/A 03/28/2014    Procedure: LEFT AND RIGHT HEART CATHETERIZATION WITH CORONARY ANGIOGRAM;  Surgeon:  Larey Dresser, MD;  Location: [facility] CATH LAB;  Service: Cardiovascular;  Laterality: N/A;    Family History  Problem Relation Age of Onset  . Diabetes Mother   . Hypertension Mother   . Hypertension Father   . Hypertension Sister   . Hypertension Brother   + ESRD in sister who is now deceased  Social History:  reports that he has never smoked. He has never used smokeless tobacco. He  reports that he uses illicit drugs (Marijuana). He reports that he does not drink alcohol.  Lives by self in [place] Allergies: No Known Allergies  Medications Prior to Admission  Medication Sig Dispense Refill  . amiodarone (PACERONE) 200 MG tablet Take 200 mg by mouth daily.    . cinacalcet (SENSIPAR) 60 MG tablet Take 180 mg by mouth daily.     Marland Kitchen lisinopril (PRINIVIL,ZESTRIL) 20 MG tablet Take 20 mg by mouth daily.    . SENSIPAR 90 MG tablet Take 180 mg by mouth daily. Take two 90 mg tablets at Lunch time    . sevelamer carbonate (RENVELA) 800 MG tablet Take 2 tablets (1,600 mg total) by mouth 3 (three) times daily with meals. (Patient taking differently: Take 2,400 mg by mouth 3 (three) times daily with meals. ) 180 tablet 1  . warfarin (COUMADIN) 5 MG tablet Take as directed by [facility] (Patient taking differently: Take 7.5-10 mg by mouth daily at 6 PM. Takes 7.5mg  on sun only takes 10mg  all other days) 60 tablet 2  . carvedilol (COREG) 6.25 MG tablet Take 1 tablet (6.25 mg total) by mouth 2 (two) times daily. (Patient not taking: Reported on 04/05/2015) 60 tablet 3  . darbepoetin (ARANESP) 100 MCG/0.5ML SOLN injection Inject 0.5 mLs (100 mcg total) into the vein every Saturday with hemodialysis. (Patient not taking: Reported on 08/09/2014) 4.2 mL   . HYDROcodone-acetaminophen (NORCO/VICODIN) 5-325 MG per tablet Take 2 tablets by mouth every 4 (four) hours as needed. 10 tablet 0     Lab Results: UA: ND   Recent Labs  04/05/15 2049 04/06/15 0515  WBC 11.5* 11.0*  HGB 8.4* 8.0*  HCT 25.6* 23.9*  PLT 287 313   BMET  Recent Labs  04/05/15 2049 04/06/15 0515  NA 134* 132*  K 4.2 4.4  CL 92* 89*  CO2 30 30  GLUCOSE 102* 99  BUN 23* 26*  CREATININE 10.90* 11.71*  CALCIUM 9.8 9.2   LFT  Recent Labs  04/06/15 0515  PROT 6.8  ALBUMIN 2.5*  AST 14*  ALT 9*  ALKPHOS 54  BILITOT 1.5*   Ct Chest W Contrast  04/05/2015   CLINICAL DATA:  Cough. Weakness. Anorexia for  2.5 weeks. Prior mitral valve replacement.  EXAM: CT CHEST WITH CONTRAST  TECHNIQUE: Multidetector CT imaging of the chest was performed during intravenous contrast administration.  CONTRAST:  62mL ISOVUE-300 IOPAMIDOL (ISOVUE-300) INJECTION 61%  COMPARISON:  03/22/2014 chest CT angiogram. 03/19/2015 chest radiograph.  FINDINGS: Mediastinum/Nodes: Mitral valve prosthesis appears well positioned. Mild cardiomegaly, decreased. No pericardial fluid/thickening. There is atherosclerosis of the thoracic aorta, the great vessels of the mediastinum and the coronary arteries, including calcified atherosclerotic plaque in the left anterior descending, left circumflex and right coronary arteries. Great vessels are normal in course and caliber. No central pulmonary emboli. There is a partially calcified 1.5 cm hypodense posterior left thyroid lobe nodule, not appreciably changed. Normal esophagus. No axillary lymphadenopathy. Stable mildly enlarged 1.4 cm upper right paratracheal node (series 3/image 11). Stable mildly enlarged  1.5 cm subcarinal node (3/32). Stable aortopulmonary window lymphadenopathy, with the largest node measuring 1.9 cm (3/22), stable. There are mildly enlarged hilar nodes bilaterally, including a 1.5 cm right hilar node (3/20) and a 1.1 cm left hilar node (3/29), stable.  Lungs/Pleura: No pneumothorax. There are calcified pleural plaques in the mid and basilar right pleural space with trace right pleural effusion/pleural thickening, decreased. One of the right hemidiaphragmatic calcified pleural plaques is new (series 4/image 39) . No left pleural effusion. There is patchy ground-glass opacity throughout both lungs, slightly less prominent in the interval. There is a 1.5 cm new cavitary nodule in the peripheral left upper lobe (series 4/image 18). There is a new subsolid 1.6 x 1.4 cm medial right upper lobe pulmonary nodule (4/22). There are several additional sub cm sub solid new pulmonary nodules  throughout both lungs (at least 10).  Upper abdomen: Multiple subcentimeter calcified gallstones are seen layering in the nondistended non thick walled gallbladder. No biliary ductal dilatation. Partially visualized is a polycystic left kidney. There is an indeterminate incompletely characterized and partially visualized exophytic hypodense mass in the lateral upper left kidney measuring at least 5.1 x 5.0 cm (series 3/image 66), which was not apparent on the 03/24/2014 CT abdomen study.  Musculoskeletal: No aggressive appearing focal osseous lesions. There is bilateral gynecomastia, asymmetric to the right, increased in the interval. Mild degenerative changes in the thoracic spine.  IMPRESSION: 1. Several (at least 10) new subsolid pulmonary nodules scattered throughout both lungs, largest 1.6 cm in the right upper lobe, including a cavitary 1.5 cm left upper lobe pulmonary nodule. Differential considerations include septic pulmonary emboli, fungal pneumonia and less likely pulmonary metastases or tuberculosis. Consider short-term follow-up post treatment chest CT in 4-6 weeks. 2. Mild cardiomegaly with patchy ground-glass opacity throughout both lungs, both which are decreased compared to the 03/22/2014 chest CT study, favor improved mild pulmonary edema. 3. Progressive calcified pleural plaques in the right pleural space. Trace right pleural effusion/thickening, decreased. 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 4. Stable nonspecific mediastinal and bilateral hilar lymphadenopathy. 5. Incompletely visualized indeterminate hypodense mass in the left upper quadrant of the abdomen, which appears to arise from the lateral upper left kidney, measures at least 5.1 cm in size and appears new since the 03/24/2014 CT abdomen study. Recommend further evaluation with renal mass protocol CT or MRI of the abdomen with and without intravenous contrast. 6. Atherosclerosis, including three-vessel  coronary artery disease. Please note that although the presence of coronary artery calcium documents the presence of coronary artery disease, the severity of this disease and any potential stenosis cannot be assessed on this non-gated CT examination. Assessment for potential risk factor modification, dietary therapy or pharmacologic therapy may be warranted, if clinically indicated. 7. Cholelithiasis. These results were called by telephone at the time of interpretation on 04/05/2015 at 5:42 pm to Dr. Posey Pronto, who verbally acknowledged these results.   Electronically Signed   By: Ilona Sorrel M.D.   On: 04/05/2015 17:42    ROS: No change in vision Occasional sweat No CP Some SOB No abd pain, no change in bowels No new arthritic CO No new neuropathic CO No dysuria   [facility], 4hr, BFR 500 with 14g needles, DW 103kg, 2K 2 Ca, No Na modeling, micera15mcg q 2 weeks, calcitriol 2.25mcg TTS   PHYSICAL EXAM: Blood pressure 142/86, pulse 79, temperature 98.9 F (37.2 C), temperature source Oral, resp. rate 20, height 5\' 11"  (1.803 m), weight  104.962 kg (231 lb 6.4 oz), SpO2 100 %. HEENT: PERRLA EOMI NECK:No JVD LUNGS:clear CARDIAC:RRR 2/6 systolic  ABD:+BS NTND No HSM EXT:No edema  Lt AVF + bruit NEURO:CNI Ox3 no asterixis  Assessment: 1. Mult pulm nodules/ 1 cavitary nodule ? If residual/recurrence from hx of MSSA bacteremia ans endocarditis 2. ESRD 3. Anemia on micera as outpt 4. Sec HPTH on sensipar and calcitriol 5. HTN PLAN: 1. HD today 2. Check PO4 3. Cont calcitriol but will decrease dose sl to 2 mcg 4. Cont ESA in way of aranesp 5. Pulm to evaluate   Joeseph Verville T 04/06/2015, 9:09 AM

## 2015-04-07 ENCOUNTER — Other Ambulatory Visit: Payer: Self-pay | Admitting: *Deleted

## 2015-04-07 DIAGNOSIS — Z992 Dependence on renal dialysis: Secondary | ICD-10-CM | POA: Diagnosis not present

## 2015-04-07 DIAGNOSIS — N186 End stage renal disease: Secondary | ICD-10-CM | POA: Diagnosis not present

## 2015-04-07 DIAGNOSIS — I120 Hypertensive chronic kidney disease with stage 5 chronic kidney disease or end stage renal disease: Secondary | ICD-10-CM | POA: Diagnosis not present

## 2015-04-07 DIAGNOSIS — I38 Endocarditis, valve unspecified: Principal | ICD-10-CM

## 2015-04-07 LAB — BASIC METABOLIC PANEL
Anion gap: 15 (ref 5–15)
BUN: 39 mg/dL — ABNORMAL HIGH (ref 6–20)
CHLORIDE: 89 mmol/L — AB (ref 101–111)
CO2: 29 mmol/L (ref 22–32)
CREATININE: 14.55 mg/dL — AB (ref 0.61–1.24)
Calcium: 9.5 mg/dL (ref 8.9–10.3)
GFR calc non Af Amer: 3 mL/min — ABNORMAL LOW (ref >60)
GFR, EST AFRICAN AMERICAN: 4 mL/min — AB (ref >60)
Glucose, Bld: 85 mg/dL (ref 65–99)
POTASSIUM: 4.2 mmol/L (ref 3.5–5.1)
Sodium: 133 mmol/L — ABNORMAL LOW (ref 135–145)

## 2015-04-07 LAB — CBC
HEMATOCRIT: 23.5 % — AB (ref 39.0–52.0)
Hemoglobin: 7.9 g/dL — ABNORMAL LOW (ref 13.0–17.0)
MCH: 31.1 pg (ref 26.0–34.0)
MCHC: 33.6 g/dL (ref 30.0–36.0)
MCV: 92.5 fL (ref 78.0–100.0)
PLATELETS: 213 10*3/uL (ref 150–400)
RBC: 2.54 MIL/uL — AB (ref 4.22–5.81)
RDW: 19 % — ABNORMAL HIGH (ref 11.5–15.5)
WBC: 8.8 10*3/uL (ref 4.0–10.5)

## 2015-04-07 LAB — PROTIME-INR
INR: 2.13 — ABNORMAL HIGH (ref 0.00–1.49)
Prothrombin Time: 23.7 seconds — ABNORMAL HIGH (ref 11.6–15.2)

## 2015-04-07 MED ORDER — CINACALCET HCL 30 MG PO TABS
90.0000 mg | ORAL_TABLET | Freq: Two times a day (BID) | ORAL | Status: DC
  Administered 2015-04-07 – 2015-04-14 (×12): 90 mg via ORAL
  Filled 2015-04-07 (×13): qty 3

## 2015-04-07 MED ORDER — WARFARIN SODIUM 7.5 MG PO TABS
7.5000 mg | ORAL_TABLET | Freq: Once | ORAL | Status: AC
  Administered 2015-04-07: 7.5 mg via ORAL
  Filled 2015-04-07: qty 1

## 2015-04-07 MED ORDER — CEFAZOLIN SODIUM-DEXTROSE 2-3 GM-% IV SOLR
2.0000 g | INTRAVENOUS | Status: AC
  Administered 2015-04-07: 2 g via INTRAVENOUS
  Filled 2015-04-07: qty 50

## 2015-04-07 MED ORDER — DARBEPOETIN ALFA 200 MCG/0.4ML IJ SOSY
PREFILLED_SYRINGE | INTRAMUSCULAR | Status: AC
  Administered 2015-04-07: 150 ug via INTRAVENOUS
  Filled 2015-04-07: qty 0.4

## 2015-04-07 NOTE — Consult Note (Signed)
Subjective: I was asked to see Timothy Palmer for a left renal mass by Dr. Carles Collet.   Timothy Palmer has ESRD and has been on dialysis for 12 years. He was admitted for pneumonia.  He had a Chest CT for evaluation and a left renal mass was partially visualized that wasn't present in 9/15.  He has APCKD with multiple bilateral cysts.   He had a CT with and without contrast to complete the w/u and it showed probable hemorrhage into a left renal cyst.  The CT report states a low suspicion for malignancy with no enhancement present.  He reports severe oliguria but no hematuria and he has had no flank pain or symptoms to suggest and active bleed or infection.  He is on warfarin for prior DVT's and his INR had been in the mid 3's but was >10 earlier this week but reversed with Vit K.  ROS:  Review of Systems  Constitutional: Negative for fever and chills.  Respiratory: Positive for cough.   Gastrointestinal: Negative for abdominal pain.  Genitourinary: Negative for hematuria.  All other systems reviewed and are negative.   No Known Allergies  Past Medical History  Diagnosis Date  . Hypertension   . Peripheral vascular disease   . Pneumonia 2014  . ESRD (end stage renal disease) on dialysis     [place],Dialysis- T,Th,S  . Acute on chronic diastolic heart failure [id number]  . Severe mitral regurgitation 03/23/2014    by TEE  . Positive blood culture 11/27/2013    MSSA  . Positive blood culture 02/24/2014    MSSA  . DVT of upper extremity (deep vein thrombosis) 03/04/2014    Right arm  . Knee pain, left 03/21/2014  . Acute combined systolic and diastolic congestive heart failure 03/21/2014  . Chronic diastolic congestive heart failure   . 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 Septic embolism to left lower extremity 03/25/2014  . 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 The aneurysm measures 1.4 x 1.1 cm which is  significantly larger than the 4.5 mm parent vessel.   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 Shortness of breath dyspnea   . Renal insufficiency     Past Surgical History  Procedure Laterality Date  . Av fistula placement Left ?2005    forearm   . Shuntogram Left November 04, 2011  . Patch angioplasty Left 07/23/2013    Procedure: PATCH ANGIOPLASTY- LEFT RADIOCEPHALIC ARTERIOVENOUS FISTULA;  Surgeon: Angelia Mould, MD;  Location: [facility];  Service: Vascular;  Laterality: Left;  . Tee without cardioversion N/A 03/24/2014    Procedure: TRANSESOPHAGEAL ECHOCARDIOGRAM (TEE);  Surgeon: Larey Dresser, MD;  Location: [facility] ENDOSCOPY;  Service: Cardiovascular;  Laterality: N/A;  . Embolectomy Left 03/25/2014    Procedure: EMBOLECTOMY left popliteal;  Surgeon: Rosetta Posner, MD;  Location: [facility];  Service: Vascular;  Laterality: Left;  . Femoral-popliteal bypass graft Left 03/25/2014    Procedure: Left Femoral- Below Knee Popliteal Bypass Graft;  Surgeon: Rosetta Posner, MD;  Location: [place];  Service: Vascular;  Laterality: Left;  . Mitral valve repair Right 04/05/2014    Procedure: MINIMALLY INVASIVE MITRAL VALVE REPAIR (MVR);  Surgeon: Rexene Alberts, MD;  Location: [facility];  Service: Open Heart Surgery;  Laterality: Right;  . Intraoperative transesophageal echocardiogram N/A 04/05/2014    Procedure: INTRAOPERATIVE TRANSESOPHAGEAL ECHOCARDIOGRAM;  Surgeon: Rexene Alberts, MD;  Location: [place];  Service: Open Heart Surgery;  Laterality: N/A;  . Mitral valve replacement Right 04/05/2014    Procedure:  Bring back MINIMALLY INVASIVE MITRAL VALVE (MV) REPLACEMENT - Reexploration for bleeding;  Surgeon: Rexene Alberts, MD;  Location: [place];  Service: Open Heart Surgery;  Laterality: Right;  . Left and right heart catheterization with coronary angiogram N/A 03/28/2014    Procedure: LEFT AND RIGHT HEART CATHETERIZATION WITH CORONARY ANGIOGRAM;  Surgeon: Larey Dresser, MD;  Location: [facility] CATH LAB;  Service: Cardiovascular;  Laterality: N/A;    Social History   Social History  . Marital Status: Single    Spouse Name: N/A  . Number of Children: N/A  . Years of Education: N/A   Occupational History  . Not on file.   Social History Main Topics  . Smoking status: Never Smoker   . Smokeless tobacco: Never Used  . Alcohol Use: No  . Drug Use: Yes    Special: Marijuana  . Sexual Activity: No   Other Topics Concern  . Not on file   Social History Narrative    Family History  Problem Relation Age of Onset  . Diabetes Mother   . Hypertension Mother   . Hypertension Father   . Hypertension Sister   . Hypertension Brother    PSFHx reviewed.   Anti-infectives: Anti-infectives    Start     Dose/Rate Route Frequency Ordered Stop   04/07/15 1445  ceFAZolin (ANCEF) IVPB 2 g/50 mL premix     2 g 100 mL/hr over 30 Minutes Intravenous NOW 04/07/15 1436 04/07/15 1731   04/06/15 1200  vancomycin (VANCOCIN) IVPB 1000 mg/200 mL premix     1,000 mg 200 mL/hr over 60 Minutes Intravenous Every T-Th-Sa (Hemodialysis) 04/06/15 0043     04/06/15 1200  ceFAZolin (ANCEF) IVPB 2 g/50 mL premix     2 g 100 mL/hr over 30 Minutes Intravenous Every T-Th-Sa (Hemodialysis) 04/06/15 1012     04/06/15 0045  vancomycin (VANCOCIN) IVPB 1000 mg/200 mL premix     1,000 mg 200 mL/hr over 60 Minutes Intravenous  Once 04/06/15 0043 04/06/15 0215   04/06/15 0045  cefTAZidime (FORTAZ) 2 g in dextrose 5 % 50 mL IVPB  Status:  Discontinued     2 g 100 mL/hr over 30 Minutes Intravenous Every T-Th-Sa (1800) 04/06/15  0043 04/06/15 1012   04/05/15 2215  vancomycin (VANCOCIN) IVPB 1000 mg/200 mL premix     1,000 mg 200 mL/hr over 60 Minutes Intravenous  Once 04/05/15 2203 04/06/15 0215   04/05/15 2215  piperacillin-tazobactam (ZOSYN) IVPB 3.375 g     3.375 g 100 mL/hr over 30 Minutes Intravenous  Once 04/05/15 2203 04/05/15 2303      Current Facility-Administered Medications  Medication Dose Route Frequency Provider Last Rate Last Dose  . amiodarone (PACERONE) tablet 200 mg  200 mg Oral Daily Lavina Hamman, MD   200 mg at 04/07/15 1659  . calcitRIOL (ROCALTROL) capsule 2 mcg  2 mcg Oral Q T,Th,Sat-1800 Orson Eva, MD   2 mcg at 04/06/15 1800  . carvedilol (COREG) tablet 6.25 mg  6.25 mg  Oral BID WC Lavina Hamman, MD   6.25 mg at 04/07/15 1659  . ceFAZolin (ANCEF) IVPB 2 g/50 mL premix  2 g Intravenous Q T,Th,Sa-HD Orson Eva, MD      . cinacalcet (SENSIPAR) tablet 90 mg  90 mg Oral BID WC Fleet Contras, MD   90 mg at 04/07/15 1659  . Darbepoetin Alfa (ARANESP) injection 150 mcg  150 mcg Intravenous Q Thu-HD Fleet Contras, MD   150 mcg at 04/07/15 1143  . lisinopril (PRINIVIL,ZESTRIL) tablet 20 mg  20 mg Oral Daily Lavina Hamman, MD   20 mg at 04/07/15 1658  . multivitamin (RENA-VIT) tablet 1 tablet  1 tablet Oral QHS Fleet Contras, MD   1 tablet at 04/07/15 2155  . sevelamer carbonate (RENVELA) tablet 2,400 mg  2,400 mg Oral TID WC Lavina Hamman, MD   2,400 mg at 04/07/15 1258  . vancomycin (VANCOCIN) IVPB 1000 mg/200 mL premix  1,000 mg Intravenous Q T,Th,Sa-HD Veronda P Bryk, RPH   1,000 mg at 04/07/15 1100  . Warfarin - Pharmacist Dosing Inpatient   Does not apply q1800 Laren Everts, RPH         Objective: Vital signs in last 24 hours: Temp:  [98.4 F (36.9 C)-100.3 F (37.9 C)] 99.1 F (37.3 C) (09/30 2114) Pulse Rate:  [77-95] 94 (09/30 2114) Resp:  [17-20] 17 (09/30 2114) BP: (130-178)/(47-85) 131/66 mmHg (09/30 2114) SpO2:  [97 %-100 %] 97 % (09/30 2114) Weight:  [101.5 kg  (223 lb 12.3 oz)-104.01 kg (229 lb 4.8 oz)] 101.5 kg (223 lb 12.3 oz) (09/30 1200)  Intake/Output from previous day: 09/29 0701 - 09/30 0700 In: 220 [P.O.:220] Out: 0  Intake/Output this shift:     Physical Exam  Constitutional: He is oriented to person, place, and time and well-developed, well-nourished, and in no distress.  Cardiovascular: Normal rate and regular rhythm.   Pulmonary/Chest: Effort normal. No respiratory distress.  Abdominal: Soft. He exhibits no mass. There is no tenderness. There is no guarding.  Musculoskeletal: Normal range of motion. He exhibits no tenderness.  Neurological: He is alert and oriented to person, place, and time.  Skin: Skin is warm and dry.  Psychiatric: Mood and affect normal.  Vitals reviewed.   Lab Results:   Recent Labs  04/06/15 0515 04/07/15 0300  WBC 11.0* 8.8  HGB 8.0* 7.9*  HCT 23.9* 23.5*  PLT 313 213   BMET  Recent Labs  04/06/15 0515 04/07/15 0300  NA 132* 133*  K 4.4 4.2  CL 89* 89*  CO2 30 29  GLUCOSE 99 85  BUN 26* 39*  CREATININE 11.71* 14.55*  CALCIUM 9.2 9.5   PT/INR  Recent Labs  04/06/15 0820 04/07/15 0300  LABPROT >90.0* 23.7*  INR >10.00* 2.13*   ABG No results for input(s): PHART, HCO3 in the last 72 hours.  Invalid input(s): PCO2, PO2  Studies/Results: Ct Abdomen Pelvis W Wo Contrast  04/06/2015   CLINICAL DATA:  49 year old hemodialysis patient with left upper quadrant abdominal mass on chest CT, incompletely visualized. Chest CT performed for cough, weakness and anorexia.  EXAM: CT ABDOMEN AND PELVIS WITHOUT AND WITH CONTRAST  TECHNIQUE: Multidetector CT imaging of the abdomen and pelvis was performed following the standard protocol before and following the bolus administration of intravenous contrast.  CONTRAST:  146mL OMNIPAQUE IOHEXOL 300 MG/ML  SOLN  COMPARISON:  Chest CT 04/05/2015.  Abdominal CTA 03/24/2014.  FINDINGS: Lower chest: The visualized lung bases are stable from yesterday's  chest CT. There is pleural thickening and calcified pleural plaque formation on the right. Prosthetic mitral valve noted.  Hepatobiliary: The liver is normal in density without focal abnormality. There are multiple calcified gallstones. The gallbladder is contracted with mild wall thickening, but no surrounding inflammation. There is no gallbladder wall thickening or evidence of choledocholithiasis.  Pancreas: Unremarkable. No pancreatic ductal dilatation or surrounding inflammatory changes.  Spleen: Normal in size without focal abnormality.  Adrenals/Urinary Tract: Both adrenal glands appear normal. Both kidneys are enlarged by multiple cystic lesions consistent with adult polycystic kidney disease. On the right, a few these demonstrate thin peripheral calcifications. There is an area of ill-defined enhancement medially in the lower pole of the right kidney, measuring approximately 11 mm on image 89 of series 7. This enhances from 48 HU prior to contrast to 168 HU on the immediate postcontrast images. No other suspicious findings are present on the right. The left kidney demonstrates 2 complex lesions and diffuse perinephric soft tissue stranding. In the upper interpolar region, there is a 5.7 x 6.8 cm lesion on image 63 of series 2 which does not show any significant enhancement, measuring 39 HU prior to contrast, 39 HU on the immediate postcontrast images and 39 HU on the delayed images. In the lower pole, there is a large irregular lesion measuring 10.2 x 6.9 cm on image 84 of series 2. This does not show significant enhancement either, measuring 46 HU, 51 HU and 56 HU on the 3 phases. There is no definite enhancing renal lesion or hydronephrosis. There is no contrast excretion following contrast. The bladder demonstrates intermediate density within its lumen, but no apparent focal abnormality.  Stomach/Bowel: No evidence of bowel wall thickening, distention or surrounding inflammatory change.   Vascular/Lymphatic: There are no enlarged abdominal or pelvic lymph nodes. Mild aortoiliac atherosclerosis appears unchanged.  Reproductive: There is mild enlargement of the prostate gland which demonstrates central dystrophic calcifications. There are prominent calcifications of the vas deferens.  Other: Small umbilical hernia containing fat.  Musculoskeletal: No acute or significant osseous findings. Degenerative disc disease noted at the lumbosacral junction.  IMPRESSION: 1. There are 2 large complex lesions involving the interpolar and lower pole regions of the left kidney with associated perinephric soft tissue stranding. Neither lesion demonstrates significant enhancement. Findings are most consistent with hemorrhagic or infected cysts. Neoplasm is unlikely given the lack of significant enhancement. 2. There is a small enhancing nodule inferomedially in the right kidney which is not well-defined, although could reflect a small tumor or vascular lesion. 3. No evidence of obstructing calculus. 4. Cholelithiasis. 5. If there is clinical concern of an infected renal cyst, cyst aspiration under ultrasound could be attempted. Otherwise, management options include CT or MR follow-up of the bilateral lesions.   Electronically Signed   By: Richardean Sale M.D.   On: 04/06/2015 21:57   I have reviewed notes from ID, Nephrology and Dr. Carles Collet.  I have reviewed the CT films and reports.  I have reviewed the hospital labs.   He has had a mild decline in his Hgb over the last 3 days and it is 7.9 on most recent labs.   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  He is currently asymptomatic with a stable anemia.    At this time I think it is most appropriate to reimage him in 3-4 weeks to assess stability unless he has increased symptoms or progressive  anemia which would indicate the need for more acute reevaluation.   I will follow.      CC: Dr Shanon Brow Tat.    WRENN,JOHN J 04/07/2015 [phone]

## 2015-04-07 NOTE — Progress Notes (Signed)
ANTIBIOTIC and ANTICOAGULANT CONSULT NOTE - FOLLOW UP  Pharmacy Consult for Vancomycin and Coumadin Indication: rule out pneumonia (cavitary lung lesion) and hx afib  No Known Allergies  Patient Measurements: Height: 5\' 11"  (180.3 cm) Weight: 223 lb 12.3 oz (101.5 kg) (stood to scale ) IBW/kg (Calculated) : 75.3  Vital Signs: Temp: 98.5 F (36.9 C) (09/30 1200) Temp Source: Oral (09/30 1200) BP: 130/67 mmHg (09/30 1200) Pulse Rate: 85 (09/30 1200) Intake/Output from previous day: 09/29 0701 - 09/30 0700 In: 220 [P.O.:220] Out: 0  Intake/Output from this shift: Total I/O In: 240 [P.O.:240] Out: 3000 [Other:3000]  Labs:  Recent Labs  04/05/15 2049 04/06/15 0515 04/07/15 0300  WBC 11.5* 11.0* 8.8  HGB 8.4* 8.0* 7.9*  PLT 287 313 213  CREATININE 10.90* 11.71* 14.55*   Lab Results  Component Value Date   INR 2.13* 04/07/2015   INR >10.00* 04/06/2015   INR 3.56* 04/05/2015     Estimated Creatinine Clearance: 7.6 mL/min (by C-G formula based on Cr of 14.55). No results for input(s): VANCOTROUGH, VANCOPEAK, VANCORANDOM, GENTTROUGH, GENTPEAK, GENTRANDOM, TOBRATROUGH, TOBRAPEAK, TOBRARND, AMIKACINPEAK, AMIKACINTROU, AMIKACIN in the last 72 hours.   Microbiology: Recent Results (from the past 720 hour(s))  Blood culture (routine x 2)     Status: None (Preliminary result)   Collection Time: 04/05/15 10:26 PM  Result Value Ref Range Status   Specimen Description BLOOD RIGHT ARM  Final   Special Requests BOTTLES DRAWN AEROBIC AND ANAEROBIC 5CC  Final   Culture NO GROWTH 2 DAYS  Final   Report Status PENDING  Incomplete  Culture, blood (single)     Status: None (Preliminary result)   Collection Time: 04/05/15 10:28 PM  Result Value Ref Range Status   Specimen Description BLOOD RIGHT FOREARM  Final   Special Requests BOTTLES DRAWN AEROBIC ONLY 10CC  Final   Culture NO GROWTH 2 DAYS  Final   Report Status PENDING  Incomplete  Blood culture (routine x 2)     Status:  None (Preliminary result)   Collection Time: 04/05/15 10:30 PM  Result Value Ref Range Status   Specimen Description BLOOD RIGHT WRIST  Final   Special Requests BOTTLES DRAWN AEROBIC AND ANAEROBIC 5CC  Final   Culture NO GROWTH 2 DAYS  Final   Report Status PENDING  Incomplete    Anti-infectives    Start     Dose/Rate Route Frequency Ordered Stop   04/07/15 1445  ceFAZolin (ANCEF) IVPB 2 g/50 mL premix     2 g 100 mL/hr over 30 Minutes Intravenous NOW 04/07/15 1436 04/08/15 1445   04/06/15 1200  vancomycin (VANCOCIN) IVPB 1000 mg/200 mL premix     1,000 mg 200 mL/hr over 60 Minutes Intravenous Every T-Th-Sa (Hemodialysis) 04/06/15 0043     04/06/15 1200  ceFAZolin (ANCEF) IVPB 2 g/50 mL premix     2 g 100 mL/hr over 30 Minutes Intravenous Every T-Th-Sa (Hemodialysis) 04/06/15 1012     04/06/15 0045  vancomycin (VANCOCIN) IVPB 1000 mg/200 mL premix     1,000 mg 200 mL/hr over 60 Minutes Intravenous  Once 04/06/15 0043 04/06/15 0215   04/06/15 0045  cefTAZidime (FORTAZ) 2 g in dextrose 5 % 50 mL IVPB  Status:  Discontinued     2 g 100 mL/hr over 30 Minutes Intravenous Every T-Th-Sa (1800) 04/06/15 0043 04/06/15 1012   04/05/15 2215  vancomycin (VANCOCIN) IVPB 1000 mg/200 mL premix     1,000 mg 200 mL/hr over 60 Minutes Intravenous  Once  04/05/15 2203 04/06/15 0215   04/05/15 2215  piperacillin-tazobactam (ZOSYN) IVPB 3.375 g     3.375 g 100 mL/hr over 30 Minutes Intravenous  Once 04/05/15 2203 04/05/15 2303      Assessment: 48yoM admitted with SOB, fever/Chills, patchy ground glass opacities/pulmonary lung nodule and concern for new onset infection on Vancomycin and Ancef with HD.  Typical HD days and TTS however was unable to complete HD on 9/29 and scheduled for HD 9/30. Will adjust antibiotic doses accordingly.  Pt also on Coumadin PTA for hx afib.  INR reported as >10 yesterday and pt received FFP and 5mg  PO Vit K.  INR today 2.13.  INR may decrease further in response to  reversal therapies given yesterday.  Will follow.  Goal of Therapy:  Pre-HD Vancomycin trough level 15-25 mcg/ml INR goal 2-3  Plan:  Coumadin 7.5mg  PO x 1 tonight Daily INR Completed 2gm Vanc load 9/29 AM Vanc 1gm IV x 1 tonight, then TTS to get back on schedule Ancef 2gm IV x 1 now, then TTS to get back on schedule  [employer], Pharm.D., BCPS Clinical Pharmacist Pager [phone] 04/07/2015 2:43 PM

## 2015-04-07 NOTE — Progress Notes (Signed)
PROGRESS NOTE  Timothy Palmer XYB:[id number] DOB: 04-02-1966 DOA: 04/05/2015 PCP: Louis Meckel, MD  Brief history 49 year old male with history of ESRD, hypertension, MSSA bacteremia with mitral valve endocarditis, Atrial fibrillation, hypertension presented with 2 weeks of worsening cough with yellow-green sputum as well as subjective fevers and chills. The patient had an abnormal chest x-ray in outpatient setting. As result CT of the chest with intravenous contrast was obtained. 04/05/2015 CT of the chest revealed bilateral mediastinal and hilar lymphadenopathy as well as improving bilateral patchy ground glass opacities. There was a new left upper lobe 1.5 cm cavitary nodule. As result, the patient was advised to come to the hospital for further workup.  Notably, the patient had MSSA bacteremia back in May 2015 and August 2015. 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 He subsequently underwent a below the knee left popliteal bypass and embolectomy. The patient completed 6 weeks of cefazolin after his mitral valve replacement. Pt states he last smoked marijuana 2 weeks ago and has remote hx of tobacco for which he only smoked 2 years over 15 yrs ago. Assessment/Plan: Cavitary lung nodule -Patient has bilateral mediastinal and hilar lymphadenopathy -Given the patient's clinical history concerning for septic emboli -Other considerations include atypical mycobacterial infection, malignancy, and fungal infection -Clinical suspicion for MTB is low--consider discontinuation respiratory isolation if okay with ID -Appreciate pulmonary--spoke with Dr. Eula Fried not amendable to bronchoscopy-->PET in out pt setting -Continue  respiratory isolation--sputum for AFB -Quantiferon TB--pending -Follow blood cultures--neg to date -ID following -Continue vancomycin  -Continue cefazolin -sputum for AFB -ESR--126 ESRD  -Appreciate nephrology consultation  -Dialysis on Tuesday, Thursday, Saturdays  Status post mitral valve replacement  -Status post mitral valve replacement--04/05/2014 secondary to endocarditis with history of MSSA bacteremia  -Repeat echocardiogram--no vegetation  Coagulopathy  -INR >10 -vitamin K x 1--INR 2.1 -recheck INR in am -likely due to acute infection -check fibrinogen, peripheral smear--not consistent with DIC -Restart coumadin  Renal Mass -CT abd/pelvis with renal mass protocol--large left complex lesion with perinephric stranding ? Hemorrhagic cyst -consult urology for opinion--?consider biopsy for infection? Paroxysmal atrial fibrillation  -This occurred postoperatively  -Restart warfarin -Continue amiodarone History of MSSA mitral valve endocarditis (native valve) -Finished 6 weeks of cefazolin after mitral valve replacement  HTN -continue coreg and lisinopril  Total time 60 min Family Communication: Pt at beside Disposition Plan: Home when medically stable    Procedures/Studies: Ct Abdomen Pelvis W Wo Contrast  04/06/2015   CLINICAL DATA:  49 year old hemodialysis patient with left upper quadrant abdominal mass on chest CT, incompletely visualized. Chest CT performed for cough, weakness and anorexia.  EXAM: CT ABDOMEN AND PELVIS WITHOUT AND WITH CONTRAST  TECHNIQUE: Multidetector CT imaging of the abdomen and pelvis was performed following the standard protocol before and following the bolus administration of intravenous contrast.  CONTRAST:  141m OMNIPAQUE IOHEXOL 300 MG/ML  SOLN  COMPARISON:  Chest CT 04/05/2015.  Abdominal CTA 03/24/2014.  FINDINGS: Lower chest: The visualized lung bases are stable from yesterday's chest CT. There is pleural thickening and  calcified pleural plaque formation on the right. Prosthetic mitral valve noted.  Hepatobiliary: The liver is normal in density without focal abnormality. There are multiple calcified gallstones. The gallbladder is contracted  with mild wall thickening, but no surrounding inflammation. There is no gallbladder wall thickening or evidence of choledocholithiasis.  Pancreas: Unremarkable. No pancreatic ductal dilatation or surrounding inflammatory changes.  Spleen: Normal in size without focal abnormality.  Adrenals/Urinary Tract: Both adrenal glands appear normal. Both kidneys are enlarged by multiple cystic lesions consistent with adult polycystic kidney disease. On the right, a few these demonstrate thin peripheral calcifications. There is an area of ill-defined enhancement medially in the lower pole of the right kidney, measuring approximately 11 mm on image 89 of series 7. This enhances from 48 HU prior to contrast to 168 HU on the immediate postcontrast images. No other suspicious findings are present on the right. The left kidney demonstrates 2 complex lesions and diffuse perinephric soft tissue stranding. In the upper interpolar region, there is a 5.7 x 6.8 cm lesion on image 63 of series 2 which does not show any significant enhancement, measuring 39 HU prior to contrast, 39 HU on the immediate postcontrast images and 39 HU on the delayed images. In the lower pole, there is a large irregular lesion measuring 10.2 x 6.9 cm on image 84 of series 2. This does not show significant enhancement either, measuring 46 HU, 51 HU and 56 HU on the 3 phases. There is no definite enhancing renal lesion or hydronephrosis. There is no contrast excretion following contrast. The bladder demonstrates intermediate density within its lumen, but no apparent focal abnormality.  Stomach/Bowel: No evidence of bowel wall thickening, distention or surrounding inflammatory change.  Vascular/Lymphatic: There are no enlarged abdominal or  pelvic lymph nodes. Mild aortoiliac atherosclerosis appears unchanged.  Reproductive: There is mild enlargement of the prostate gland which demonstrates central dystrophic calcifications. There are prominent calcifications of the vas deferens.  Other: Small umbilical hernia containing fat.  Musculoskeletal: No acute or significant osseous findings. Degenerative disc disease noted at the lumbosacral junction.  IMPRESSION: 1. There are 2 large complex lesions involving the interpolar and lower pole regions of the left kidney with associated perinephric soft tissue stranding. Neither lesion demonstrates significant enhancement. Findings are most consistent with hemorrhagic or infected cysts. Neoplasm is unlikely given the lack of significant enhancement. 2. There is a small enhancing nodule inferomedially in the right kidney which is not well-defined, although could reflect a small tumor or vascular lesion. 3. No evidence of obstructing calculus. 4. Cholelithiasis. 5. If there is clinical concern of an infected renal cyst, cyst aspiration under ultrasound could be attempted. Otherwise, management options include CT or MR follow-up of the bilateral lesions.   Electronically Signed   By: Richardean Sale M.D.   On: 04/06/2015 21:57   Dg Chest 2 View  03/19/2015   CLINICAL DATA:  Chest pain  EXAM: CHEST  2 VIEW  COMPARISON:  05/30/2014  FINDINGS: Stable mild cardiomegaly and vascular pedicle widening in this patient post mitral valve repair.  Pleural and parenchymal scarring on the right with volume loss which is presumably postoperative.  Subtle small density in the peripheral left upper chest near the posterior fifth rib is likely summation shadows with the scapula. There is no edema, consolidation, effusion, or pneumothorax.  No acute osseous finding to explain chest pain.  IMPRESSION: 1. No active cardiopulmonary disease. 2. Stable pleural parenchymal scarring on the right.   Electronically Signed   By: Monte Fantasia M.D.   On: 03/19/2015 17:58   Ct Chest W Contrast  04/05/2015   CLINICAL DATA:  Cough. Weakness. Anorexia for 2.5 weeks. Prior mitral  valve replacement.  EXAM: CT CHEST WITH CONTRAST  TECHNIQUE: Multidetector CT imaging of the chest was performed during intravenous contrast administration.  CONTRAST:  41m ISOVUE-300 IOPAMIDOL (ISOVUE-300) INJECTION 61%  COMPARISON:  03/22/2014 chest CT angiogram. 03/19/2015 chest radiograph.  FINDINGS: Mediastinum/Nodes: Mitral valve prosthesis appears well positioned. Mild cardiomegaly, decreased. No pericardial fluid/thickening. There is atherosclerosis of the thoracic aorta, the great vessels of the mediastinum and the coronary arteries, including calcified atherosclerotic plaque in the left anterior descending, left circumflex and right coronary arteries. Great vessels are normal in course and caliber. No central pulmonary emboli. There is a partially calcified 1.5 cm hypodense posterior left thyroid lobe nodule, not appreciably changed. Normal esophagus. No axillary lymphadenopathy. Stable mildly enlarged 1.4 cm upper right paratracheal node (series 3/image 11). Stable mildly enlarged 1.5 cm subcarinal node (3/32). Stable aortopulmonary window lymphadenopathy, with the largest node measuring 1.9 cm (3/22), stable. There are mildly enlarged hilar nodes bilaterally, including a 1.5 cm right hilar node (3/20) and a 1.1 cm left hilar node (3/29), stable.  Lungs/Pleura: No pneumothorax. There are calcified pleural plaques in the mid and basilar right pleural space with trace right pleural effusion/pleural thickening, decreased. One of the right hemidiaphragmatic calcified pleural plaques is new (series 4/image 39) . No left pleural effusion. There is patchy ground-glass opacity throughout both lungs, slightly less prominent in the interval. There is a 1.5 cm new cavitary nodule in the peripheral left upper lobe (series 4/image 18). There is a new subsolid 1.6 x 1.4 cm  medial right upper lobe pulmonary nodule (4/22). There are several additional sub cm sub solid new pulmonary nodules throughout both lungs (at least 10).  Upper abdomen: Multiple subcentimeter calcified gallstones are seen layering in the nondistended non thick walled gallbladder. No biliary ductal dilatation. Partially visualized is a polycystic left kidney. There is an indeterminate incompletely characterized and partially visualized exophytic hypodense mass in the lateral upper left kidney measuring at least 5.1 x 5.0 cm (series 3/image 66), which was not apparent on the 03/24/2014 CT abdomen study.  Musculoskeletal: No aggressive appearing focal osseous lesions. There is bilateral gynecomastia, asymmetric to the right, increased in the interval. Mild degenerative changes in the thoracic spine.  IMPRESSION: 1. Several (at least 10) new subsolid pulmonary nodules scattered throughout both lungs, largest 1.6 cm in the right upper lobe, including a cavitary 1.5 cm left upper lobe pulmonary nodule. Differential considerations include septic pulmonary emboli, fungal pneumonia and less likely pulmonary metastases or tuberculosis. Consider short-term follow-up post treatment chest CT in 4-6 weeks. 2. Mild cardiomegaly with patchy ground-glass opacity throughout both lungs, both which are decreased compared to the 03/22/2014 chest CT study, favor improved mild pulmonary edema. 3. Progressive calcified pleural plaques in the right pleural space. Trace right pleural effusion/thickening, decreased. 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 4. Stable nonspecific mediastinal and bilateral hilar lymphadenopathy. 5. Incompletely visualized indeterminate hypodense mass in the left upper quadrant of the abdomen, which appears to arise from the lateral upper left kidney, measures at least 5.1 cm in size and appears new since the 03/24/2014 CT abdomen study. Recommend further evaluation with renal mass  protocol CT or MRI of the abdomen with and without intravenous contrast. 6. Atherosclerosis, including three-vessel coronary artery disease. Please note that although the presence of coronary artery calcium documents the presence of coronary artery disease, the severity of this disease and any potential stenosis cannot be assessed on this non-gated CT examination. Assessment for potential risk factor modification,  dietary therapy or pharmacologic therapy may be warranted, if clinically indicated. 7. Cholelithiasis. These results were called by telephone at the time of interpretation on 04/05/2015 at 5:42 pm to Dr. Posey Pronto, who verbally acknowledged these results.   Electronically Signed   By: Ilona Sorrel M.D.   On: 04/05/2015 17:42         Subjective: Patient complains of incessant cough. Denies any hemoptysis. States that his fever and chills are improved. Denies any chest pain, chest breath, nausea, vomiting, diarrhea, vomiting, rashes, headache, neck pain., Back pain.  Objective: Filed Vitals:   04/07/15 1030 04/07/15 1100 04/07/15 1130 04/07/15 1200  BP: 154/51 167/74 143/64 130/67  Pulse: 84 84 83 85  Temp:    98.5 F (36.9 C)  TempSrc:    Oral  Resp:    18  Height:      Weight:    101.5 kg (223 lb 12.3 oz)  SpO2:    98%    Intake/Output Summary (Last 24 hours) at 04/07/15 1615 Last data filed at 04/07/15 1300  Gross per 24 hour  Intake    240 ml  Output   3000 ml  Net  -2760 ml   Weight change: -0.318 kg (-11.2 oz) Exam:   General:  Pt is alert, follows commands appropriately, not in acute distress  HEENT: No icterus, No thrush, No neck mass, [place]/AT  Cardiovascular: RRR, S1/S2, no rubs, no gallops  Respiratory: CTA bilaterally, no wheezing, no crackles, no rhonchi  Abdomen: Soft/+BS, non tender, non distended, no guarding; no hepatosplenomegaly  Extremities: No edema, No lymphangitis, No petechiae, No rashes, no synovitis; no cyanosis or clubbing  Data  Reviewed: Basic Metabolic Panel:  Recent Labs Lab 04/05/15 2049 04/06/15 0515 04/06/15 0930 04/07/15 0300  NA 134* 132*  --  133*  K 4.2 4.4  --  4.2  CL 92* 89*  --  89*  CO2 30 30  --  29  GLUCOSE 102* 99  --  85  BUN 23* 26*  --  39*  CREATININE 10.90* 11.71*  --  14.55*  CALCIUM 9.8 9.2  --  9.5  PHOS  --   --  7.0*  --    Liver Function Tests:  Recent Labs Lab 04/06/15 0515  AST 14*  ALT 9*  ALKPHOS 54  BILITOT 1.5*  PROT 6.8  ALBUMIN 2.5*   No results for input(s): LIPASE, AMYLASE in the last 168 hours. No results for input(s): AMMONIA in the last 168 hours. CBC:  Recent Labs Lab 04/05/15 2049 04/06/15 0515 04/07/15 0300  WBC 11.5* 11.0* 8.8  NEUTROABS  --  8.6*  --   HGB 8.4* 8.0* 7.9*  HCT 25.6* 23.9* 23.5*  MCV 91.8 91.6 92.5  PLT 287 313 213   Cardiac Enzymes: No results for input(s): CKTOTAL, CKMB, CKMBINDEX, TROPONINI in the last 168 hours. BNP: Invalid input(s): POCBNP CBG: No results for input(s): GLUCAP in the last 168 hours.  Recent Results (from the past 240 hour(s))  Blood culture (routine x 2)     Status: None (Preliminary result)   Collection Time: 04/05/15 10:26 PM  Result Value Ref Range Status   Specimen Description BLOOD RIGHT ARM  Final   Special Requests BOTTLES DRAWN AEROBIC AND ANAEROBIC 5CC  Final   Culture NO GROWTH 2 DAYS  Final   Report Status PENDING  Incomplete  Culture, blood (single)     Status: None (Preliminary result)   Collection Time: 04/05/15 10:28 PM  Result Value  Ref Range Status   Specimen Description BLOOD RIGHT FOREARM  Final   Special Requests BOTTLES DRAWN AEROBIC ONLY 10CC  Final   Culture NO GROWTH 2 DAYS  Final   Report Status PENDING  Incomplete  Blood culture (routine x 2)     Status: None (Preliminary result)   Collection Time: 04/05/15 10:30 PM  Result Value Ref Range Status   Specimen Description BLOOD RIGHT WRIST  Final   Special Requests BOTTLES DRAWN AEROBIC AND ANAEROBIC 5CC  Final    Culture NO GROWTH 2 DAYS  Final   Report Status PENDING  Incomplete     Scheduled Meds: . amiodarone  200 mg Oral Daily  . calcitRIOL  2 mcg Oral Q T,Th,Sat-1800  . carvedilol  6.25 mg Oral BID WC  .  ceFAZolin (ANCEF) IV  2 g Intravenous Q T,Th,Sa-HD  .  ceFAZolin (ANCEF) IV  2 g Intravenous NOW  . cinacalcet  90 mg Oral BID WC  . darbepoetin (ARANESP) injection - DIALYSIS  150 mcg Intravenous Q Thu-HD  . lisinopril  20 mg Oral Daily  . multivitamin  1 tablet Oral QHS  . sevelamer carbonate  2,400 mg Oral TID WC  . vancomycin  1,000 mg Intravenous Q T,Th,Sa-HD  . warfarin  7.5 mg Oral ONCE-1800  . Warfarin - Pharmacist Dosing Inpatient   Does not apply q1800   Continuous Infusions:    TAT, DAVID, DO  [facility] Pager [phone]  If 7PM-7AM, please contact night-coverage [email or web address] Password [email or web address] 04/07/2015, 4:15 PM   LOS: 2 days

## 2015-04-07 NOTE — Procedures (Signed)
Pt seen on HD.  Ap 150 Vp 110.  BFR 350.  Just getting started.  Will pull 2.5L.  If plan is to get MRI of kidney, it needs to be done without gadolinium.  Will plan HD again tomorrow.

## 2015-04-07 NOTE — Progress Notes (Signed)
Name: Timothy Palmer MRN: [id number] DOB: Nov 28, 1965    ADMISSION DATE:  04/05/2015 CONSULTATION DATE:  9/29  REFERRING MD :  [facility]   CHIEF COMPLAINT:  Cavitary lung nodule   BRIEF PATIENT DESCRIPTION: 49yo with hx ESRD, HTN, chronic dCHF with hx mitral valve endocarditis with valve replacement 2015, Afib on coumadin.  Presented 9/28 with 2 weeks hx cough, SOB, night sweats, fever.  Abnormal CXR prompted CT chest which revealed cavitary lung lesion and PCCM consulted  SIGNIFICANT EVENTS    STUDIES:  CT chest 9/29>>> Several (at least 10) new subsolid pulmonary nodules scattered throughout both lungs, largest 1.6 cm in the right upper lobe, including a cavitary 1.5 cm left upper lobe pulmonary nodule.  Progressive calcified pleural plaques in the right pleural space.  Incompletely visualized indeterminate hypodense mass in the left upper quadrant of the abdomen, which appears to arise from the lateral upper left kidney, measures at least 5.1 cm in size and appears new since the 03/24/2014 CT abdomen study   SUBJECTIVE:   VITAL SIGNS: Temp:  [98.4 F (36.9 C)-99.3 F (37.4 C)] 98.4 F (36.9 C) (09/30 0731) Pulse Rate:  [77-84] 84 (09/30 1100) Resp:  [18-20] 18 (09/30 0731) BP: (125-178)/(47-85) 167/74 mmHg (09/30 1100) SpO2:  [98 %] 98 % (09/30 0731) Weight:  [104.01 kg (229 lb 4.8 oz)] 104.01 kg (229 lb 4.8 oz) (09/30 0440) 2 liters  PHYSICAL EXAMINATION: General:  Obese male in NAD Neuro:  Alert, oriented, non-focal HEENT:  [place]/AT, PERRL, no JVD Cardiovascular:  RRR, 4/6 SEM 2nd ICS LSB Lungs:  Clear bilateral breath sounds, no accessory muscle use  Abdomen:  Soft, non-tender, non-distended Musculoskeletal:  No acute deformity or ROM limitation, no edema Skin:  Grossly intact, no redness or warmth of extremities    Recent Labs Lab 04/05/15 2049 04/06/15 0515 04/07/15 0300  NA 134* 132* 133*  K 4.2 4.4 4.2  CL 92* 89* 89*  CO2 30 30 29   BUN 23* 26* 39*    CREATININE 10.90* 11.71* 14.55*  GLUCOSE 102* 99 85    Recent Labs Lab 04/05/15 2049 04/06/15 0515 04/07/15 0300  HGB 8.4* 8.0* 7.9*  HCT 25.6* 23.9* 23.5*  WBC 11.5* 11.0* 8.8  PLT 287 313 213   Ct Abdomen Pelvis W Wo Contrast  04/06/2015   CLINICAL DATA:  49 year old hemodialysis patient with left upper quadrant abdominal mass on chest CT, incompletely visualized. Chest CT performed for cough, weakness and anorexia.  EXAM: CT ABDOMEN AND PELVIS WITHOUT AND WITH CONTRAST  TECHNIQUE: Multidetector CT imaging of the abdomen and pelvis was performed following the standard protocol before and following the bolus administration of intravenous contrast.  CONTRAST:  155mL OMNIPAQUE IOHEXOL 300 MG/ML  SOLN  COMPARISON:  Chest CT 04/05/2015.  Abdominal CTA 03/24/2014.  FINDINGS: Lower chest: The visualized lung bases are stable from yesterday's chest CT. There is pleural thickening and calcified pleural plaque formation on the right. Prosthetic mitral valve noted.  Hepatobiliary: The liver is normal in density without focal abnormality. There are multiple calcified gallstones. The gallbladder is contracted with mild wall thickening, but no surrounding inflammation. There is no gallbladder wall thickening or evidence of choledocholithiasis.  Pancreas: Unremarkable. No pancreatic ductal dilatation or surrounding inflammatory changes.  Spleen: Normal in size without focal abnormality.  Adrenals/Urinary Tract: Both adrenal glands appear normal. Both kidneys are enlarged by multiple cystic lesions consistent with adult polycystic kidney disease. On the right, a few these demonstrate thin peripheral calcifications. There is  an area of ill-defined enhancement medially in the lower pole of the right kidney, measuring approximately 11 mm on image 89 of series 7. This enhances from 48 HU prior to contrast to 168 HU on the immediate postcontrast images. No other suspicious findings are present on the right. The left  kidney demonstrates 2 complex lesions and diffuse perinephric soft tissue stranding. In the upper interpolar region, there is a 5.7 x 6.8 cm lesion on image 63 of series 2 which does not show any significant enhancement, measuring 39 HU prior to contrast, 39 HU on the immediate postcontrast images and 39 HU on the delayed images. In the lower pole, there is a large irregular lesion measuring 10.2 x 6.9 cm on image 84 of series 2. This does not show significant enhancement either, measuring 46 HU, 51 HU and 56 HU on the 3 phases. There is no definite enhancing renal lesion or hydronephrosis. There is no contrast excretion following contrast. The bladder demonstrates intermediate density within its lumen, but no apparent focal abnormality.  Stomach/Bowel: No evidence of bowel wall thickening, distention or surrounding inflammatory change.  Vascular/Lymphatic: There are no enlarged abdominal or pelvic lymph nodes. Mild aortoiliac atherosclerosis appears unchanged.  Reproductive: There is mild enlargement of the prostate gland which demonstrates central dystrophic calcifications. There are prominent calcifications of the vas deferens.  Other: Small umbilical hernia containing fat.  Musculoskeletal: No acute or significant osseous findings. Degenerative disc disease noted at the lumbosacral junction.  IMPRESSION: 1. There are 2 large complex lesions involving the interpolar and lower pole regions of the left kidney with associated perinephric soft tissue stranding. Neither lesion demonstrates significant enhancement. Findings are most consistent with hemorrhagic or infected cysts. Neoplasm is unlikely given the lack of significant enhancement. 2. There is a small enhancing nodule inferomedially in the right kidney which is not well-defined, although could reflect a small tumor or vascular lesion. 3. No evidence of obstructing calculus. 4. Cholelithiasis. 5. If there is clinical concern of an infected renal cyst, cyst  aspiration under ultrasound could be attempted. Otherwise, management options include CT or MR follow-up of the bilateral lesions.   Electronically Signed   By: Richardean Sale M.D.   On: 04/06/2015 21:57   Ct Chest W Contrast  04/05/2015   CLINICAL DATA:  Cough. Weakness. Anorexia for 2.5 weeks. Prior mitral valve replacement.  EXAM: CT CHEST WITH CONTRAST  TECHNIQUE: Multidetector CT imaging of the chest was performed during intravenous contrast administration.  CONTRAST:  38mL ISOVUE-300 IOPAMIDOL (ISOVUE-300) INJECTION 61%  COMPARISON:  03/22/2014 chest CT angiogram. 03/19/2015 chest radiograph.  FINDINGS: Mediastinum/Nodes: Mitral valve prosthesis appears well positioned. Mild cardiomegaly, decreased. No pericardial fluid/thickening. There is atherosclerosis of the thoracic aorta, the great vessels of the mediastinum and the coronary arteries, including calcified atherosclerotic plaque in the left anterior descending, left circumflex and right coronary arteries. Great vessels are normal in course and caliber. No central pulmonary emboli. There is a partially calcified 1.5 cm hypodense posterior left thyroid lobe nodule, not appreciably changed. Normal esophagus. No axillary lymphadenopathy. Stable mildly enlarged 1.4 cm upper right paratracheal node (series 3/image 11). Stable mildly enlarged 1.5 cm subcarinal node (3/32). Stable aortopulmonary window lymphadenopathy, with the largest node measuring 1.9 cm (3/22), stable. There are mildly enlarged hilar nodes bilaterally, including a 1.5 cm right hilar node (3/20) and a 1.1 cm left hilar node (3/29), stable.  Lungs/Pleura: No pneumothorax. There are calcified pleural plaques in the mid and basilar right pleural space with trace  right pleural effusion/pleural thickening, decreased. One of the right hemidiaphragmatic calcified pleural plaques is new (series 4/image 39) . No left pleural effusion. There is patchy ground-glass opacity throughout both lungs,  slightly less prominent in the interval. There is a 1.5 cm new cavitary nodule in the peripheral left upper lobe (series 4/image 18). There is a new subsolid 1.6 x 1.4 cm medial right upper lobe pulmonary nodule (4/22). There are several additional sub cm sub solid new pulmonary nodules throughout both lungs (at least 10).  Upper abdomen: Multiple subcentimeter calcified gallstones are seen layering in the nondistended non thick walled gallbladder. No biliary ductal dilatation. Partially visualized is a polycystic left kidney. There is an indeterminate incompletely characterized and partially visualized exophytic hypodense mass in the lateral upper left kidney measuring at least 5.1 x 5.0 cm (series 3/image 66), which was not apparent on the 03/24/2014 CT abdomen study.  Musculoskeletal: No aggressive appearing focal osseous lesions. There is bilateral gynecomastia, asymmetric to the right, increased in the interval. Mild degenerative changes in the thoracic spine.  IMPRESSION: 1. Several (at least 10) new subsolid pulmonary nodules scattered throughout both lungs, largest 1.6 cm in the right upper lobe, including a cavitary 1.5 cm left upper lobe pulmonary nodule. Differential considerations include septic pulmonary emboli, fungal pneumonia and less likely pulmonary metastases or tuberculosis. Consider short-term follow-up post treatment chest CT in 4-6 weeks. 2. Mild cardiomegaly with patchy ground-glass opacity throughout both lungs, both which are decreased compared to the 03/22/2014 chest CT study, favor improved mild pulmonary edema. 3. Progressive calcified pleural plaques in the right pleural space. Trace right pleural effusion/thickening, decreased. 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 4. Stable nonspecific mediastinal and bilateral hilar lymphadenopathy. 5. Incompletely visualized indeterminate hypodense mass in the left upper quadrant of the abdomen, which appears to arise  from the lateral upper left kidney, measures at least 5.1 cm in size and appears new since the 03/24/2014 CT abdomen study. Recommend further evaluation with renal mass protocol CT or MRI of the abdomen with and without intravenous contrast. 6. Atherosclerosis, including three-vessel coronary artery disease. Please note that although the presence of coronary artery calcium documents the presence of coronary artery disease, the severity of this disease and any potential stenosis cannot be assessed on this non-gated CT examination. Assessment for potential risk factor modification, dietary therapy or pharmacologic therapy may be warranted, if clinically indicated. 7. Cholelithiasis. These results were called by telephone at the time of interpretation on 04/05/2015 at 5:42 pm to Dr. Posey Pronto, who verbally acknowledged these results.   Electronically Signed   By: Ilona Sorrel M.D.   On: 04/05/2015 17:42    ASSESSMENT / PLAN:  Cavitary lung lesion w/ mediastinal adenopathy: etiology unclear at this point. Likely infectious (given h/o MSSA bacteremia), have to consider metastatic renal cell cancer. Mediastinal lymphadenopathy appears to be chronic dating back to 03/2014  Plan: - Cefazolin 9/29 >>> - Vancomycin 9/29 >>> - Follow Cultures     Blood 9/28 >>>     Sputum 9/28 >>>     AFB Sputum  >>> - Trend WBC and fever curve If no improvement with ABX may benefit from bronchoscopic evaluation for BAL and possible lymph node biopsy  - Suggest- proceed with imaging kidneys with MRI  - If Biopsy required, would consider outpatient PET scan before deciding on a site of biopsy   Possible L renal mass  Plan: F/u MRI  Erick Colace ACNP-BC [place] Pager #  [phone] OR # [phone] if no answer

## 2015-04-08 LAB — LEGIONELLA PNEUMOPHILA SEROGP 1 UR AG: L. pneumophila Serogp 1 Ur Ag: NEGATIVE

## 2015-04-08 LAB — IRON AND TIBC
Iron: 14 ug/dL — ABNORMAL LOW (ref 45–182)
Saturation Ratios: 8 % — ABNORMAL LOW (ref 17.9–39.5)
TIBC: 175 ug/dL — ABNORMAL LOW (ref 250–450)
UIBC: 161 ug/dL

## 2015-04-08 LAB — PROTIME-INR
INR: 1.67 — ABNORMAL HIGH (ref 0.00–1.49)
PROTHROMBIN TIME: 19.7 s — AB (ref 11.6–15.2)

## 2015-04-08 LAB — EXPECTORATED SPUTUM ASSESSMENT W REFEX TO RESP CULTURE

## 2015-04-08 LAB — FERRITIN: Ferritin: 1337 ng/mL — ABNORMAL HIGH (ref 24–336)

## 2015-04-08 LAB — EXPECTORATED SPUTUM ASSESSMENT W GRAM STAIN, RFLX TO RESP C

## 2015-04-08 MED ORDER — CALCITRIOL 0.5 MCG PO CAPS
2.2500 ug | ORAL_CAPSULE | ORAL | Status: DC
  Administered 2015-04-08 – 2015-04-13 (×3): 2.25 ug via ORAL
  Filled 2015-04-08: qty 4
  Filled 2015-04-08 (×3): qty 1
  Filled 2015-04-08 (×3): qty 4
  Filled 2015-04-08 (×2): qty 1

## 2015-04-08 MED ORDER — NEPRO/CARBSTEADY PO LIQD
237.0000 mL | Freq: Two times a day (BID) | ORAL | Status: DC
  Administered 2015-04-09: 237 mL via ORAL

## 2015-04-08 MED ORDER — WARFARIN SODIUM 7.5 MG PO TABS
7.5000 mg | ORAL_TABLET | Freq: Once | ORAL | Status: AC
  Administered 2015-04-08: 7.5 mg via ORAL
  Filled 2015-04-08: qty 1

## 2015-04-08 NOTE — Progress Notes (Signed)
ANTICOAGULANT CONSULT NOTE - FOLLOW UP  Pharmacy Consult for Coumadin Indication: hx afib  No Known Allergies  Patient Measurements: Height: 5\' 11"  (180.3 cm) Weight: 225 lb 8.5 oz (102.3 kg) (weighed in bed ) IBW/kg (Calculated) : 75.3  Vital Signs: Temp: 98.3 F (36.8 C) (10/01 0730) Temp Source: Oral (10/01 0730) BP: 98/64 mmHg (10/01 1121) Pulse Rate: 51 (10/01 1121) Intake/Output from previous day: 09/30 0701 - 10/01 0700 In: 600 [P.O.:600] Out: 3000  Intake/Output from this shift: Total I/O In: 240 [P.O.:240] Out: 0   Labs:  Recent Labs  04/05/15 2049 04/06/15 0515 04/07/15 0300  WBC 11.5* 11.0* 8.8  HGB 8.4* 8.0* 7.9*  PLT 287 313 213  CREATININE 10.90* 11.71* 14.55*   Lab Results  Component Value Date   INR 1.67* 04/08/2015   INR 2.13* 04/07/2015   INR >10.00* 04/06/2015     Estimated Creatinine Clearance: 7.6 mL/min (by C-G formula based on Cr of 14.55). No results for input(s): VANCOTROUGH, VANCOPEAK, VANCORANDOM, GENTTROUGH, GENTPEAK, GENTRANDOM, TOBRATROUGH, TOBRAPEAK, TOBRARND, AMIKACINPEAK, AMIKACINTROU, AMIKACIN in the last 72 hours.   Microbiology: Recent Results (from the past 720 hour(s))  Blood culture (routine x 2)     Status: None (Preliminary result)   Collection Time: 04/05/15 10:26 PM  Result Value Ref Range Status   Specimen Description BLOOD RIGHT ARM  Final   Special Requests BOTTLES DRAWN AEROBIC AND ANAEROBIC 5CC  Final   Culture NO GROWTH 2 DAYS  Final   Report Status PENDING  Incomplete  Culture, blood (single)     Status: None (Preliminary result)   Collection Time: 04/05/15 10:28 PM  Result Value Ref Range Status   Specimen Description BLOOD RIGHT FOREARM  Final   Special Requests BOTTLES DRAWN AEROBIC ONLY 10CC  Final   Culture NO GROWTH 2 DAYS  Final   Report Status PENDING  Incomplete  Blood culture (routine x 2)     Status: None (Preliminary result)   Collection Time: 04/05/15 10:30 PM  Result Value Ref Range  Status   Specimen Description BLOOD RIGHT WRIST  Final   Special Requests BOTTLES DRAWN AEROBIC AND ANAEROBIC 5CC  Final   Culture NO GROWTH 2 DAYS  Final   Report Status PENDING  Incomplete  Culture, sputum-assessment     Status: None   Collection Time: 04/08/15  7:46 AM  Result Value Ref Range Status   Specimen Description SPUTUM  Final   Special Requests NONE  Final   Sputum evaluation   Final    MICROSCOPIC FINDINGS SUGGEST THAT THIS SPECIMEN IS NOT REPRESENTATIVE OF LOWER RESPIRATORY SECRETIONS. PLEASE RECOLLECT. Gram Stain Report Called to,Read Back By and Verified With: Richardson Chiquito AT 0825 ON [id number] BY Rhea Bleacher    Report Status 04/08/2015 FINAL  Final    Anti-infectives    Start     Dose/Rate Route Frequency Ordered Stop   04/07/15 1445  ceFAZolin (ANCEF) IVPB 2 g/50 mL premix     2 g 100 mL/hr over 30 Minutes Intravenous NOW 04/07/15 1436 04/07/15 1731   04/06/15 1200  vancomycin (VANCOCIN) IVPB 1000 mg/200 mL premix     1,000 mg 200 mL/hr over 60 Minutes Intravenous Every T-Th-Sa (Hemodialysis) 04/06/15 0043     04/06/15 1200  ceFAZolin (ANCEF) IVPB 2 g/50 mL premix     2 g 100 mL/hr over 30 Minutes Intravenous Every T-Th-Sa (Hemodialysis) 04/06/15 1012     04/06/15 0045  vancomycin (VANCOCIN) IVPB 1000 mg/200 mL premix  1,000 mg 200 mL/hr over 60 Minutes Intravenous  Once 04/06/15 0043 04/06/15 0215   04/06/15 0045  cefTAZidime (FORTAZ) 2 g in dextrose 5 % 50 mL IVPB  Status:  Discontinued     2 g 100 mL/hr over 30 Minutes Intravenous Every T-Th-Sa (1800) 04/06/15 0043 04/06/15 1012   04/05/15 2215  vancomycin (VANCOCIN) IVPB 1000 mg/200 mL premix     1,000 mg 200 mL/hr over 60 Minutes Intravenous  Once 04/05/15 2203 04/06/15 0215   04/05/15 2215  piperacillin-tazobactam (ZOSYN) IVPB 3.375 g     3.375 g 100 mL/hr over 30 Minutes Intravenous  Once 04/05/15 2203 04/05/15 2303      Assessment: 48yoM admitted with SOB, fever/Chills, patchy ground glass  opacities/pulmonary lung nodule and concern for new onset infection on Vancomycin and Ancef with HD.  Typical HD days and TTS.  Pt also on Coumadin PTA for hx afib.  INR reported as >10 on 9/28 and pt received FFP and 5mg  PO Vit K.  INR trending down to 1.67 today.  INR may decrease further in response to reversal therapies given.  Will follow.  Goal of Therapy:  INR goal 2-3  Plan:  Coumadin 7.5mg  PO x 1 tonight Daily INR  [employer], Pharm.D., BCPS Clinical Pharmacist Pager [phone] 04/08/2015 11:58 AM

## 2015-04-08 NOTE — Progress Notes (Signed)
PROGRESS NOTE  Timothy Palmer FGH:[id number] DOB: 10-Nov-1965 DOA: 04/05/2015 PCP: Louis Meckel, MD  Brief history 49 year old male with history of ESRD, hypertension, MSSA bacteremia with mitral valve endocarditis, Atrial fibrillation, hypertension presented with 2 weeks of worsening cough with yellow-green sputum as well as subjective fevers and chills. The patient had an abnormal chest x-ray in outpatient setting. As result CT of the chest with intravenous contrast was obtained. 04/05/2015 CT of the chest revealed bilateral mediastinal and hilar lymphadenopathy as well as improving bilateral patchy ground glass opacities. There was a new left upper lobe 1.5 cm cavitary nodule. As result, the patient was advised to come to the hospital for further workup.  Notably, the patient had MSSA bacteremia back in May 2015 and August 2015. 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 He subsequently underwent a below the knee left popliteal bypass and embolectomy. The patient completed 6 weeks of cefazolin after his mitral valve replacement. Pt states he last smoked marijuana 2 weeks ago and has remote hx of tobacco for which he only smoked 2 years over 15 yrs ago. Assessment/Plan: Cavitary lung nodule -Patient has bilateral mediastinal and hilar lymphadenopathy -Given the patient's clinical history concerning for septic emboli -Other considerations include atypical mycobacterial infection, malignancy, and fungal infection -Clinical suspicion for MTB is low--consider discontinuation respiratory isolation if okay with ID -Appreciate pulmonary--spoke with Dr. Eula Fried not amendable to bronchoscopy-->PET in out pt setting -Continue  respiratory isolation--sputum for AFB -Quantiferon TB--pending -Follow blood cultures--neg to date -ID following -case discussed with Dr. Drucilla Schmidt -continue abx for now -sputum for AFB-->induce another sample -will consult cardiology for possible TEE--if neg and blood cultures remain neg--consider observe abx -check ACE -ESR--126 ESRD  -Appreciate nephrology consultation  -Dialysis on Tuesday, Thursday, Saturdays  Status post mitral valve replacement  -Status post mitral valve replacement--04/05/2014 secondary to endocarditis with history of MSSA bacteremia  -Repeat echocardiogram (TTE)--no vegetation  Coagulopathy  -INR >10 -vitamin K x 1-->INR 2.1 -recheck INR in am -likely due to acute infection -check fibrinogen, peripheral smear--not consistent with DIC -Restart coumadin  Renal Mass -CT abd/pelvis with renal mass protocol--large left complex lesion with perinephric stranding--Hemorrhagic cyst -appreciate urology opinion--repeat CT in 2-3 weeks Paroxysmal atrial fibrillation  -This occurred postoperatively  -Restart warfarin -Continue amiodarone History of MSSA mitral valve endocarditis (native valve) -Finished 6 weeks of cefazolin after mitral valve replacement  HTN -continue coreg and lisinopril   Family Communication: Pt at beside; total time--35 min. Disposition Plan: Home when medically stable       Procedures/Studies: Ct Abdomen Pelvis W Wo Contrast  04/06/2015   CLINICAL DATA:  49 year old hemodialysis patient with left upper quadrant abdominal mass on chest CT, incompletely visualized. Chest CT performed for cough, weakness and anorexia.  EXAM: CT ABDOMEN AND PELVIS WITHOUT AND WITH CONTRAST  TECHNIQUE: Multidetector CT imaging of the abdomen and pelvis was performed following the standard protocol before and following the bolus administration of intravenous contrast.  CONTRAST:  115m OMNIPAQUE IOHEXOL 300 MG/ML  SOLN  COMPARISON:  Chest CT  04/05/2015.  Abdominal CTA 03/24/2014.  FINDINGS: Lower chest: The visualized lung bases are stable from yesterday's chest CT. There is pleural thickening and calcified pleural plaque formation on the right.  Prosthetic mitral valve noted.  Hepatobiliary: The liver is normal in density without focal abnormality. There are multiple calcified gallstones. The gallbladder is contracted with mild wall thickening, but no surrounding inflammation. There is no gallbladder wall thickening or evidence of choledocholithiasis.  Pancreas: Unremarkable. No pancreatic ductal dilatation or surrounding inflammatory changes.  Spleen: Normal in size without focal abnormality.  Adrenals/Urinary Tract: Both adrenal glands appear normal. Both kidneys are enlarged by multiple cystic lesions consistent with adult polycystic kidney disease. On the right, a few these demonstrate thin peripheral calcifications. There is an area of ill-defined enhancement medially in the lower pole of the right kidney, measuring approximately 11 mm on image 89 of series 7. This enhances from 48 HU prior to contrast to 168 HU on the immediate postcontrast images. No other suspicious findings are present on the right. The left kidney demonstrates 2 complex lesions and diffuse perinephric soft tissue stranding. In the upper interpolar region, there is a 5.7 x 6.8 cm lesion on image 63 of series 2 which does not show any significant enhancement, measuring 39 HU prior to contrast, 39 HU on the immediate postcontrast images and 39 HU on the delayed images. In the lower pole, there is a large irregular lesion measuring 10.2 x 6.9 cm on image 84 of series 2. This does not show significant enhancement either, measuring 46 HU, 51 HU and 56 HU on the 3 phases. There is no definite enhancing renal lesion or hydronephrosis. There is no contrast excretion following contrast. The bladder demonstrates intermediate density within its lumen, but no apparent focal abnormality.   Stomach/Bowel: No evidence of bowel wall thickening, distention or surrounding inflammatory change.  Vascular/Lymphatic: There are no enlarged abdominal or pelvic lymph nodes. Mild aortoiliac atherosclerosis appears unchanged.  Reproductive: There is mild enlargement of the prostate gland which demonstrates central dystrophic calcifications. There are prominent calcifications of the vas deferens.  Other: Small umbilical hernia containing fat.  Musculoskeletal: No acute or significant osseous findings. Degenerative disc disease noted at the lumbosacral junction.  IMPRESSION: 1. There are 2 large complex lesions involving the interpolar and lower pole regions of the left kidney with associated perinephric soft tissue stranding. Neither lesion demonstrates significant enhancement. Findings are most consistent with hemorrhagic or infected cysts. Neoplasm is unlikely given the lack of significant enhancement. 2. There is a small enhancing nodule inferomedially in the right kidney which is not well-defined, although could reflect a small tumor or vascular lesion. 3. No evidence of obstructing calculus. 4. Cholelithiasis. 5. If there is clinical concern of an infected renal cyst, cyst aspiration under ultrasound could be attempted. Otherwise, management options include CT or MR follow-up of the bilateral lesions.   Electronically Signed   By: Richardean Sale M.D.   On: 04/06/2015 21:57   Dg Chest 2 View  03/19/2015   CLINICAL DATA:  Chest pain  EXAM: CHEST  2 VIEW  COMPARISON:  05/30/2014  FINDINGS: Stable mild cardiomegaly and vascular pedicle widening in this patient post mitral valve repair.  Pleural and parenchymal scarring on the right with volume loss which is presumably postoperative.  Subtle small density in the peripheral left upper chest near the posterior fifth rib is likely summation shadows with the scapula. There is no edema, consolidation, effusion, or pneumothorax.  No acute osseous finding to explain  chest pain.  IMPRESSION: 1. No active cardiopulmonary disease. 2. Stable pleural parenchymal scarring on the right.   Electronically Signed   By: Neva Seat.D.  On: 03/19/2015 17:58   Ct Chest W Contrast  04/05/2015   CLINICAL DATA:  Cough. Weakness. Anorexia for 2.5 weeks. Prior mitral valve replacement.  EXAM: CT CHEST WITH CONTRAST  TECHNIQUE: Multidetector CT imaging of the chest was performed during intravenous contrast administration.  CONTRAST:  13m ISOVUE-300 IOPAMIDOL (ISOVUE-300) INJECTION 61%  COMPARISON:  03/22/2014 chest CT angiogram. 03/19/2015 chest radiograph.  FINDINGS: Mediastinum/Nodes: Mitral valve prosthesis appears well positioned. Mild cardiomegaly, decreased. No pericardial fluid/thickening. There is atherosclerosis of the thoracic aorta, the great vessels of the mediastinum and the coronary arteries, including calcified atherosclerotic plaque in the left anterior descending, left circumflex and right coronary arteries. Great vessels are normal in course and caliber. No central pulmonary emboli. There is a partially calcified 1.5 cm hypodense posterior left thyroid lobe nodule, not appreciably changed. Normal esophagus. No axillary lymphadenopathy. Stable mildly enlarged 1.4 cm upper right paratracheal node (series 3/image 11). Stable mildly enlarged 1.5 cm subcarinal node (3/32). Stable aortopulmonary window lymphadenopathy, with the largest node measuring 1.9 cm (3/22), stable. There are mildly enlarged hilar nodes bilaterally, including a 1.5 cm right hilar node (3/20) and a 1.1 cm left hilar node (3/29), stable.  Lungs/Pleura: No pneumothorax. There are calcified pleural plaques in the mid and basilar right pleural space with trace right pleural effusion/pleural thickening, decreased. One of the right hemidiaphragmatic calcified pleural plaques is new (series 4/image 39) . No left pleural effusion. There is patchy ground-glass opacity throughout both lungs, slightly less  prominent in the interval. There is a 1.5 cm new cavitary nodule in the peripheral left upper lobe (series 4/image 18). There is a new subsolid 1.6 x 1.4 cm medial right upper lobe pulmonary nodule (4/22). There are several additional sub cm sub solid new pulmonary nodules throughout both lungs (at least 10).  Upper abdomen: Multiple subcentimeter calcified gallstones are seen layering in the nondistended non thick walled gallbladder. No biliary ductal dilatation. Partially visualized is a polycystic left kidney. There is an indeterminate incompletely characterized and partially visualized exophytic hypodense mass in the lateral upper left kidney measuring at least 5.1 x 5.0 cm (series 3/image 66), which was not apparent on the 03/24/2014 CT abdomen study.  Musculoskeletal: No aggressive appearing focal osseous lesions. There is bilateral gynecomastia, asymmetric to the right, increased in the interval. Mild degenerative changes in the thoracic spine.  IMPRESSION: 1. Several (at least 10) new subsolid pulmonary nodules scattered throughout both lungs, largest 1.6 cm in the right upper lobe, including a cavitary 1.5 cm left upper lobe pulmonary nodule. Differential considerations include septic pulmonary emboli, fungal pneumonia and less likely pulmonary metastases or tuberculosis. Consider short-term follow-up post treatment chest CT in 4-6 weeks. 2. Mild cardiomegaly with patchy ground-glass opacity throughout both lungs, both which are decreased compared to the 03/22/2014 chest CT study, favor improved mild pulmonary edema. 3. Progressive calcified pleural plaques in the right pleural space. Trace right pleural effusion/thickening, decreased. 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 4. Stable nonspecific mediastinal and bilateral hilar lymphadenopathy. 5. Incompletely visualized indeterminate hypodense mass in the left upper quadrant of the abdomen, which appears to arise from the lateral  upper left kidney, measures at least 5.1 cm in size and appears new since the 03/24/2014 CT abdomen study. Recommend further evaluation with renal mass protocol CT or MRI of the abdomen with and without intravenous contrast. 6. Atherosclerosis, including three-vessel coronary artery disease. Please note that although the presence of coronary artery calcium documents the presence of coronary artery  disease, the severity of this disease and any potential stenosis cannot be assessed on this non-gated CT examination. Assessment for potential risk factor modification, dietary therapy or pharmacologic therapy may be warranted, if clinically indicated. 7. Cholelithiasis. These results were called by telephone at the time of interpretation on 04/05/2015 at 5:42 pm to Dr. Posey Pronto, who verbally acknowledged these results.   Electronically Signed   By: Ilona Sorrel M.D.   On: 04/05/2015 17:42         Subjective: Patient complains of cough with sputum production. Denies any fevers, chills, chest pain, shortness breath, nausea, vomiting, diarrhea, bowel pain. He denies any headache or neck pain. No rashes or synovitis.  Objective: Filed Vitals:   04/08/15 1121 04/08/15 1130 04/08/15 1207 04/08/15 1256  BP: 98/64 187/34 127/62   Pulse: 51 81 81   Temp:   98.4 F (36.9 C)   TempSrc:   Oral   Resp:   18   Height:      Weight:    98.703 kg (217 lb 9.6 oz)  SpO2:   98%     Intake/Output Summary (Last 24 hours) at 04/08/15 1600 Last data filed at 04/08/15 1207  Gross per 24 hour  Intake    600 ml  Output   2800 ml  Net  -2200 ml   Weight change: -2.51 kg (-5 lb 8.5 oz) Exam:   General:  Pt is alert, follows commands appropriately, not in acute distress  HEENT: No icterus, No thrush, No neck mass, [place]/AT  Cardiovascular: RRR, S1/S2, no rubs, no gallops  Respiratory: Fine bibasilar crackles. No wheezing. Good air movement.  Abdomen: Soft/+BS, non tender, non distended, no guarding  Extremities:  No edema, No lymphangitis, No petechiae, No rashes, no synovitis  Data Reviewed: Basic Metabolic Panel:  Recent Labs Lab 04/05/15 2049 04/06/15 0515 04/06/15 0930 04/07/15 0300  NA 134* 132*  --  133*  K 4.2 4.4  --  4.2  CL 92* 89*  --  89*  CO2 30 30  --  29  GLUCOSE 102* 99  --  85  BUN 23* 26*  --  39*  CREATININE 10.90* 11.71*  --  14.55*  CALCIUM 9.8 9.2  --  9.5  PHOS  --   --  7.0*  --    Liver Function Tests:  Recent Labs Lab 04/06/15 0515  AST 14*  ALT 9*  ALKPHOS 54  BILITOT 1.5*  PROT 6.8  ALBUMIN 2.5*   No results for input(s): LIPASE, AMYLASE in the last 168 hours. No results for input(s): AMMONIA in the last 168 hours. CBC:  Recent Labs Lab 04/05/15 2049 04/06/15 0515 04/07/15 0300  WBC 11.5* 11.0* 8.8  NEUTROABS  --  8.6*  --   HGB 8.4* 8.0* 7.9*  HCT 25.6* 23.9* 23.5*  MCV 91.8 91.6 92.5  PLT 287 313 213   Cardiac Enzymes: No results for input(s): CKTOTAL, CKMB, CKMBINDEX, TROPONINI in the last 168 hours. BNP: Invalid input(s): POCBNP CBG: No results for input(s): GLUCAP in the last 168 hours.  Recent Results (from the past 240 hour(s))  Blood culture (routine x 2)     Status: None (Preliminary result)   Collection Time: 04/05/15 10:26 PM  Result Value Ref Range Status   Specimen Description BLOOD RIGHT ARM  Final   Special Requests BOTTLES DRAWN AEROBIC AND ANAEROBIC 5CC  Final   Culture NO GROWTH 3 DAYS  Final   Report Status PENDING  Incomplete  Culture, blood (  single)     Status: None (Preliminary result)   Collection Time: 04/05/15 10:28 PM  Result Value Ref Range Status   Specimen Description BLOOD RIGHT FOREARM  Final   Special Requests BOTTLES DRAWN AEROBIC ONLY 10CC  Final   Culture NO GROWTH 3 DAYS  Final   Report Status PENDING  Incomplete  Blood culture (routine x 2)     Status: None (Preliminary result)   Collection Time: 04/05/15 10:30 PM  Result Value Ref Range Status   Specimen Description BLOOD RIGHT WRIST   Final   Special Requests BOTTLES DRAWN AEROBIC AND ANAEROBIC 5CC  Final   Culture NO GROWTH 3 DAYS  Final   Report Status PENDING  Incomplete  Culture, sputum-assessment     Status: None   Collection Time: 04/08/15  7:46 AM  Result Value Ref Range Status   Specimen Description SPUTUM  Final   Special Requests NONE  Final   Sputum evaluation   Final    MICROSCOPIC FINDINGS SUGGEST THAT THIS SPECIMEN IS NOT REPRESENTATIVE OF LOWER RESPIRATORY SECRETIONS. PLEASE RECOLLECT. Gram Stain Report Called to,Read Back By and Verified With: K. Johnney Ou AT 0825 ON [id number] BY Rhea Bleacher    Report Status 04/08/2015 FINAL  Final     Scheduled Meds: . amiodarone  200 mg Oral Daily  . calcitRIOL  2.25 mcg Oral Q T,Th,Sat-1800  . carvedilol  6.25 mg Oral BID WC  .  ceFAZolin (ANCEF) IV  2 g Intravenous Q T,Th,Sa-HD  . cinacalcet  90 mg Oral BID WC  . darbepoetin (ARANESP) injection - DIALYSIS  150 mcg Intravenous Q Thu-HD  . feeding supplement (NEPRO CARB STEADY)  237 mL Oral BID BM  . lisinopril  20 mg Oral Daily  . multivitamin  1 tablet Oral QHS  . sevelamer carbonate  2,400 mg Oral TID WC  . vancomycin  1,000 mg Intravenous Q T,Th,Sa-HD  . warfarin  7.5 mg Oral ONCE-1800  . Warfarin - Pharmacist Dosing Inpatient   Does not apply q1800   Continuous Infusions:    TAT, DAVID, DO  [facility] Pager [phone]  If 7PM-7AM, please contact night-coverage [email or web address] Password [email or web address] 04/08/2015, 4:00 PM   LOS: 3 days

## 2015-04-08 NOTE — Progress Notes (Signed)
[name] Progress Note  Assessment/Plan: 1. Cavitary lung nodule -work up per primary/ID/ESR 126 empiric antibiotics; nodules not ammenable to bronch 2. ESRD -TTS -HD yesterday - back on schedule today - pre HD weight was 102.3 - bed scale - asked HD RN to get standing weight post HD 3. Anemia - Hgb 7.9, stable- last Mircera 150 9/20 - check Fe studies- given Aranesp 150 9/30 4. Secondary hyperparathyroidism - uncontrolled iPTH 1470 when last checked - continue calcitriol, binders/sensipar 5. HTN/volume - UF 3 L 9/30 with post weight 101.5- goal Sat 3 L 6. Nutrition - losing weight alb 2.5; EDW recently lowered 2 kg and now below new edw- add supplements 7. Renal mass -evaluation by Dr. Jeffie Pollock suggest he has probably hemoorhage into one or more of his acquired renal cysts.  Rec ReCT in 3-4 weeks 8. 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 Coagulopathy- INR 2.13 9/30- ok; INR 9/29 ? error 10. PAF-on Braxton 04/08/2015,7:42 AM  LOS: 3 days  I have seen and examined this patient and agree with plan per Amalia Hailey.  Pt seen on HD.  Ap 190 Vp 190  BFR 450.  BC neg to date.  On Vanco.   Makenize Messman T,MD 04/08/2015 8:31 AM Subjective:   Ate all breakfast except eggs, still feeling rough, had night sweats prior to admission  Objective Filed Vitals:   04/07/15 1200 04/07/15 1600 04/07/15 2114 04/08/15 0514  BP: 130/67 147/73 131/66 109/72  Pulse: 85 95 94 78  Temp: 98.5 F (36.9 C) 100.3 F (37.9 C) 99.1 F (37.3 C) 98.5 F (36.9 C)  TempSrc: Oral Oral Oral Oral  Resp: '18 18 17 18  ' Height:      Weight: 101.5 kg (223 lb 12.3 oz)     SpO2: 98% 100% 97% 98%   Physical Exam General: NAD - HD being initiated Heart: RRR 2/6 murmur Lungs: no rales Abdomen: obese soft , slightly distended Extremities: no LE edema Dialysis Access: left AVF  Dialysis Orders: [facility], 4hr, BFR 500 with 14g needles,  DW 103kg, 2K 2 Ca, No Na modeling, micera121mg q 2 weeks, calcitriol 2.228m TTS  Additional Objective Labs: Basic Metabolic Panel:  Recent Labs Lab 04/05/15 2049 04/06/15 0515 04/06/15 0930 04/07/15 0300  NA 134* 132*  --  133*  K 4.2 4.4  --  4.2  CL 92* 89*  --  89*  CO2 30 30  --  29  GLUCOSE 102* 99  --  85  BUN 23* 26*  --  39*  CREATININE 10.90* 11.71*  --  14.55*  CALCIUM 9.8 9.2  --  9.5  PHOS  --   --  7.0*  --    Liver Function Tests:  Recent Labs Lab 04/06/15 0515  AST 14*  ALT 9*  ALKPHOS 54  BILITOT 1.5*  PROT 6.8  ALBUMIN 2.5*   Lab Results  Component Value Date   INR 2.13* 04/07/2015   INR >10.00* 04/06/2015   INR 3.56* 04/05/2015    CBC:  Recent Labs Lab 04/05/15 2049 04/06/15 0515 04/07/15 0300  WBC 11.5* 11.0* 8.8  NEUTROABS  --  8.6*  --   HGB 8.4* 8.0* 7.9*  HCT 25.6* 23.9* 23.5*  MCV 91.8 91.6 92.5  PLT 287 313 213   Blood Culture    Component Value Date/Time   SDES BLOOD RIGHT WRIST 04/05/2015 2230   SPECREQUEST BOTTLES DRAWN AEROBIC AND ANAEROBIC  5CC 04/05/2015 2230   CULT NO GROWTH 2 DAYS 04/05/2015 2230   REPTSTATUS PENDING 04/05/2015 2230     Studies/Results: Ct Abdomen Pelvis W Wo Contrast  04/06/2015   CLINICAL DATA:  49 year old hemodialysis patient with left upper quadrant abdominal mass on chest CT, incompletely visualized. Chest CT performed for cough, weakness and anorexia.  EXAM: CT ABDOMEN AND PELVIS WITHOUT AND WITH CONTRAST  TECHNIQUE: Multidetector CT imaging of the abdomen and pelvis was performed following the standard protocol before and following the bolus administration of intravenous contrast.  CONTRAST:  176m OMNIPAQUE IOHEXOL 300 MG/ML  SOLN  COMPARISON:  Chest CT 04/05/2015.  Abdominal CTA 03/24/2014.  FINDINGS: Lower chest: The visualized lung bases are stable from yesterday's chest CT. There is pleural thickening and calcified pleural plaque formation on the right. Prosthetic mitral valve noted.   Hepatobiliary: The liver is normal in density without focal abnormality. There are multiple calcified gallstones. The gallbladder is contracted with mild wall thickening, but no surrounding inflammation. There is no gallbladder wall thickening or evidence of choledocholithiasis.  Pancreas: Unremarkable. No pancreatic ductal dilatation or surrounding inflammatory changes.  Spleen: Normal in size without focal abnormality.  Adrenals/Urinary Tract: Both adrenal glands appear normal. Both kidneys are enlarged by multiple cystic lesions consistent with adult polycystic kidney disease. On the right, a few these demonstrate thin peripheral calcifications. There is an area of ill-defined enhancement medially in the lower pole of the right kidney, measuring approximately 11 mm on image 89 of series 7. This enhances from 48 HU prior to contrast to 168 HU on the immediate postcontrast images. No other suspicious findings are present on the right. The left kidney demonstrates 2 complex lesions and diffuse perinephric soft tissue stranding. In the upper interpolar region, there is a 5.7 x 6.8 cm lesion on image 63 of series 2 which does not show any significant enhancement, measuring 39 HU prior to contrast, 39 HU on the immediate postcontrast images and 39 HU on the delayed images. In the lower pole, there is a large irregular lesion measuring 10.2 x 6.9 cm on image 84 of series 2. This does not show significant enhancement either, measuring 46 HU, 51 HU and 56 HU on the 3 phases. There is no definite enhancing renal lesion or hydronephrosis. There is no contrast excretion following contrast. The bladder demonstrates intermediate density within its lumen, but no apparent focal abnormality.  Stomach/Bowel: No evidence of bowel wall thickening, distention or surrounding inflammatory change.  Vascular/Lymphatic: There are no enlarged abdominal or pelvic lymph nodes. Mild aortoiliac atherosclerosis appears unchanged.  Reproductive:  There is mild enlargement of the prostate gland which demonstrates central dystrophic calcifications. There are prominent calcifications of the vas deferens.  Other: Small umbilical hernia containing fat.  Musculoskeletal: No acute or significant osseous findings. Degenerative disc disease noted at the lumbosacral junction.  IMPRESSION: 1. There are 2 large complex lesions involving the interpolar and lower pole regions of the left kidney with associated perinephric soft tissue stranding. Neither lesion demonstrates significant enhancement. Findings are most consistent with hemorrhagic or infected cysts. Neoplasm is unlikely given the lack of significant enhancement. 2. There is a small enhancing nodule inferomedially in the right kidney which is not well-defined, although could reflect a small tumor or vascular lesion. 3. No evidence of obstructing calculus. 4. Cholelithiasis. 5. If there is clinical concern of an infected renal cyst, cyst aspiration under ultrasound could be attempted. Otherwise, management options include CT or MR follow-up of the bilateral lesions.  Electronically Signed   By: Richardean Sale M.D.   On: 04/06/2015 21:57   Medications:   . amiodarone  200 mg Oral Daily  . calcitRIOL  2 mcg Oral Q T,Th,Sat-1800  . carvedilol  6.25 mg Oral BID WC  .  ceFAZolin (ANCEF) IV  2 g Intravenous Q T,Th,Sa-HD  . cinacalcet  90 mg Oral BID WC  . darbepoetin (ARANESP) injection - DIALYSIS  150 mcg Intravenous Q Thu-HD  . lisinopril  20 mg Oral Daily  . multivitamin  1 tablet Oral QHS  . sevelamer carbonate  2,400 mg Oral TID WC  . vancomycin  1,000 mg Intravenous Q T,Th,Sa-HD  . Warfarin - Pharmacist Dosing Inpatient   Does not apply [phone]

## 2015-04-09 LAB — QUANTIFERON IN TUBE
QFT TB AG MINUS NIL VALUE: 0.01 [IU]/mL
QUANTIFERON MITOGEN VALUE: 1.4 IU/mL
QUANTIFERON TB AG VALUE: 0.05 IU/mL
QUANTIFERON TB GOLD: NEGATIVE
Quantiferon Nil Value: 0.04 IU/mL

## 2015-04-09 LAB — PROTIME-INR
INR: 1.72 — ABNORMAL HIGH (ref 0.00–1.49)
PROTHROMBIN TIME: 20.1 s — AB (ref 11.6–15.2)

## 2015-04-09 LAB — QUANTIFERON TB GOLD ASSAY (BLOOD)

## 2015-04-09 MED ORDER — WARFARIN SODIUM 7.5 MG PO TABS
7.5000 mg | ORAL_TABLET | Freq: Once | ORAL | Status: AC
  Administered 2015-04-09: 7.5 mg via ORAL
  Filled 2015-04-09: qty 1

## 2015-04-09 MED ORDER — SODIUM CHLORIDE 0.9 % IV SOLN
125.0000 mg | INTRAVENOUS | Status: DC
  Administered 2015-04-11 – 2015-04-13 (×2): 125 mg via INTRAVENOUS
  Filled 2015-04-09 (×4): qty 10

## 2015-04-09 NOTE — Progress Notes (Signed)
PROGRESS NOTE  Timothy Palmer XMI:[id number] DOB: Dec 11, 1965 DOA: 04/05/2015 PCP: Louis Meckel, MD Brief history 49 year old male with history of ESRD, hypertension, MSSA bacteremia with mitral valve endocarditis, Atrial fibrillation, hypertension presented with 2 weeks of worsening cough with yellow-green sputum as well as subjective fevers and chills. The patient had an abnormal chest x-ray in outpatient setting. As result CT of the chest with intravenous contrast was obtained. 04/05/2015 CT of the chest revealed bilateral mediastinal and hilar lymphadenopathy as well as improving bilateral patchy ground glass opacities. There was a new left upper lobe 1.5 cm cavitary nodule. As result, the patient was advised to come to the hospital for further workup.  Notably, the patient had MSSA bacteremia back in May 2015 and August 2015. 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 He subsequently underwent a below the knee left popliteal bypass and embolectomy. The patient completed 6 weeks of cefazolin after his mitral valve replacement. Pt states he last smoked marijuana 2 weeks ago and has remote hx of tobacco for which he only smoked 2 years over 15 yrs ago. Assessment/Plan: Cavitary lung nodule -Patient has bilateral mediastinal and hilar lymphadenopathy -Given the patient's clinical history concerning for septic emboli -Other considerations include atypical mycobacterial infection, malignancy, and fungal infection -Clinical suspicion for MTB is low--consider discontinuation respiratory isolation if okay with ID -Appreciate pulmonary--spoke with Dr. Eula Fried not amendable to bronchoscopy-->PET in out pt setting -Continue respiratory  isolation--sputum for AFB x 3 ( 2 samples collected) -Quantiferon TB--negative -blood cultures--neg to date -ID following -Will request for TEE once AFB neg x 3 (hopefully schedule for Tues) -sputum for AFB-->induce 3rd sample today -if TEE and AFB neg--possible d/c abx and observe -check ACE -ESR--126 ESRD  -Appreciate nephrology consultation  -Dialysis on Tuesday, Thursday, Saturdays  Status post mitral valve replacement  -Status post mitral valve replacement--04/05/2014 secondary to endocarditis with history of MSSA bacteremia  -Repeat echocardiogram (TTE)--no vegetation  Coagulopathy  -INR >10 -vitamin K x 1-->INR 2.1 -recheck INR in am -likely due to acute infection -check fibrinogen, peripheral smear--not consistent with DIC -Restart coumadin  Renal Mass -CT abd/pelvis with renal mass protocol--large left complex lesion with perinephric stranding--Hemorrhagic cyst -appreciate urology opinion--repeat CT in 2-3 weeks Paroxysmal atrial fibrillation  -This occurred postoperatively  -Restart warfarin -Continue amiodarone History of MSSA mitral valve endocarditis (native valve) -Finished 6 weeks of cefazolin after mitral valve replacement  HTN -continue coreg and lisinopril   Family Communication: Pt at beside Disposition Plan: Home when medically stable   Procedures/Studies: Ct Abdomen Pelvis W Wo Contrast  04/06/2015   CLINICAL DATA:  49 year old hemodialysis patient with left upper quadrant abdominal mass on chest CT, incompletely visualized. Chest CT performed for cough, weakness and anorexia.  EXAM: CT ABDOMEN AND PELVIS WITHOUT AND WITH CONTRAST  TECHNIQUE: Multidetector CT imaging of the abdomen and pelvis was performed following the standard protocol before and following the bolus administration of intravenous contrast.  CONTRAST:  124m OMNIPAQUE IOHEXOL 300 MG/ML  SOLN  COMPARISON:  Chest CT 04/05/2015.  Abdominal CTA 03/24/2014.  FINDINGS: Lower  chest: The visualized lung bases are stable from yesterday's chest CT. There is pleural thickening and calcified pleural plaque formation on the right. Prosthetic mitral valve  noted.  Hepatobiliary: The liver is normal in density without focal abnormality. There are multiple calcified gallstones. The gallbladder is contracted with mild wall thickening, but no surrounding inflammation. There is no gallbladder wall thickening or evidence of choledocholithiasis.  Pancreas: Unremarkable. No pancreatic ductal dilatation or surrounding inflammatory changes.  Spleen: Normal in size without focal abnormality.  Adrenals/Urinary Tract: Both adrenal glands appear normal. Both kidneys are enlarged by multiple cystic lesions consistent with adult polycystic kidney disease. On the right, a few these demonstrate thin peripheral calcifications. There is an area of ill-defined enhancement medially in the lower pole of the right kidney, measuring approximately 11 mm on image 89 of series 7. This enhances from 48 HU prior to contrast to 168 HU on the immediate postcontrast images. No other suspicious findings are present on the right. The left kidney demonstrates 2 complex lesions and diffuse perinephric soft tissue stranding. In the upper interpolar region, there is a 5.7 x 6.8 cm lesion on image 63 of series 2 which does not show any significant enhancement, measuring 39 HU prior to contrast, 39 HU on the immediate postcontrast images and 39 HU on the delayed images. In the lower pole, there is a large irregular lesion measuring 10.2 x 6.9 cm on image 84 of series 2. This does not show significant enhancement either, measuring 46 HU, 51 HU and 56 HU on the 3 phases. There is no definite enhancing renal lesion or hydronephrosis. There is no contrast excretion following contrast. The bladder demonstrates intermediate density within its lumen, but no apparent focal abnormality.  Stomach/Bowel: No evidence of bowel wall thickening,  distention or surrounding inflammatory change.  Vascular/Lymphatic: There are no enlarged abdominal or pelvic lymph nodes. Mild aortoiliac atherosclerosis appears unchanged.  Reproductive: There is mild enlargement of the prostate gland which demonstrates central dystrophic calcifications. There are prominent calcifications of the vas deferens.  Other: Small umbilical hernia containing fat.  Musculoskeletal: No acute or significant osseous findings. Degenerative disc disease noted at the lumbosacral junction.  IMPRESSION: 1. There are 2 large complex lesions involving the interpolar and lower pole regions of the left kidney with associated perinephric soft tissue stranding. Neither lesion demonstrates significant enhancement. Findings are most consistent with hemorrhagic or infected cysts. Neoplasm is unlikely given the lack of significant enhancement. 2. There is a small enhancing nodule inferomedially in the right kidney which is not well-defined, although could reflect a small tumor or vascular lesion. 3. No evidence of obstructing calculus. 4. Cholelithiasis. 5. If there is clinical concern of an infected renal cyst, cyst aspiration under ultrasound could be attempted. Otherwise, management options include CT or MR follow-up of the bilateral lesions.   Electronically Signed   By: Richardean Sale M.D.   On: 04/06/2015 21:57   Dg Chest 2 View  03/19/2015   CLINICAL DATA:  Chest pain  EXAM: CHEST  2 VIEW  COMPARISON:  05/30/2014  FINDINGS: Stable mild cardiomegaly and vascular pedicle widening in this patient post mitral valve repair.  Pleural and parenchymal scarring on the right with volume loss which is presumably postoperative.  Subtle small density in the peripheral left upper chest near the posterior fifth rib is likely summation shadows with the scapula. There is no edema, consolidation, effusion, or pneumothorax.  No acute osseous finding to explain chest pain.  IMPRESSION: 1. No active cardiopulmonary  disease. 2. Stable pleural parenchymal scarring on the right.   Electronically Signed   By: Monte Fantasia M.D.   On: 03/19/2015 17:58  Ct Chest W Contrast  04/05/2015   CLINICAL DATA:  Cough. Weakness. Anorexia for 2.5 weeks. Prior mitral valve replacement.  EXAM: CT CHEST WITH CONTRAST  TECHNIQUE: Multidetector CT imaging of the chest was performed during intravenous contrast administration.  CONTRAST:  58m ISOVUE-300 IOPAMIDOL (ISOVUE-300) INJECTION 61%  COMPARISON:  03/22/2014 chest CT angiogram. 03/19/2015 chest radiograph.  FINDINGS: Mediastinum/Nodes: Mitral valve prosthesis appears well positioned. Mild cardiomegaly, decreased. No pericardial fluid/thickening. There is atherosclerosis of the thoracic aorta, the great vessels of the mediastinum and the coronary arteries, including calcified atherosclerotic plaque in the left anterior descending, left circumflex and right coronary arteries. Great vessels are normal in course and caliber. No central pulmonary emboli. There is a partially calcified 1.5 cm hypodense posterior left thyroid lobe nodule, not appreciably changed. Normal esophagus. No axillary lymphadenopathy. Stable mildly enlarged 1.4 cm upper right paratracheal node (series 3/image 11). Stable mildly enlarged 1.5 cm subcarinal node (3/32). Stable aortopulmonary window lymphadenopathy, with the largest node measuring 1.9 cm (3/22), stable. There are mildly enlarged hilar nodes bilaterally, including a 1.5 cm right hilar node (3/20) and a 1.1 cm left hilar node (3/29), stable.  Lungs/Pleura: No pneumothorax. There are calcified pleural plaques in the mid and basilar right pleural space with trace right pleural effusion/pleural thickening, decreased. One of the right hemidiaphragmatic calcified pleural plaques is new (series 4/image 39) . No left pleural effusion. There is patchy ground-glass opacity throughout both lungs, slightly less prominent in the interval. There is a 1.5 cm new cavitary  nodule in the peripheral left upper lobe (series 4/image 18). There is a new subsolid 1.6 x 1.4 cm medial right upper lobe pulmonary nodule (4/22). There are several additional sub cm sub solid new pulmonary nodules throughout both lungs (at least 10).  Upper abdomen: Multiple subcentimeter calcified gallstones are seen layering in the nondistended non thick walled gallbladder. No biliary ductal dilatation. Partially visualized is a polycystic left kidney. There is an indeterminate incompletely characterized and partially visualized exophytic hypodense mass in the lateral upper left kidney measuring at least 5.1 x 5.0 cm (series 3/image 66), which was not apparent on the 03/24/2014 CT abdomen study.  Musculoskeletal: No aggressive appearing focal osseous lesions. There is bilateral gynecomastia, asymmetric to the right, increased in the interval. Mild degenerative changes in the thoracic spine.  IMPRESSION: 1. Several (at least 10) new subsolid pulmonary nodules scattered throughout both lungs, largest 1.6 cm in the right upper lobe, including a cavitary 1.5 cm left upper lobe pulmonary nodule. Differential considerations include septic pulmonary emboli, fungal pneumonia and less likely pulmonary metastases or tuberculosis. Consider short-term follow-up post treatment chest CT in 4-6 weeks. 2. Mild cardiomegaly with patchy ground-glass opacity throughout both lungs, both which are decreased compared to the 03/22/2014 chest CT study, favor improved mild pulmonary edema. 3. Progressive calcified pleural plaques in the right pleural space. Trace right pleural effusion/thickening, decreased. 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 4. Stable nonspecific mediastinal and bilateral hilar lymphadenopathy. 5. Incompletely visualized indeterminate hypodense mass in the left upper quadrant of the abdomen, which appears to arise from the lateral upper left kidney, measures at least 5.1 cm in size and  appears new since the 03/24/2014 CT abdomen study. Recommend further evaluation with renal mass protocol CT or MRI of the abdomen with and without intravenous contrast. 6. Atherosclerosis, including three-vessel coronary artery disease. Please note that although the presence of coronary artery calcium documents the presence of coronary artery disease, the severity of this  disease and any potential stenosis cannot be assessed on this non-gated CT examination. Assessment for potential risk factor modification, dietary therapy or pharmacologic therapy may be warranted, if clinically indicated. 7. Cholelithiasis. These results were called by telephone at the time of interpretation on 04/05/2015 at 5:42 pm to Dr. Posey Pronto, who verbally acknowledged these results.   Electronically Signed   By: Ilona Sorrel M.D.   On: 04/05/2015 17:42        Subjective: Patient continues to complain of coughing without hemoptysis. Denies any fevers, chills, chest pain, nausea, vomiting, diarrhea, and, pain. No hematochezia or melena.  Objective: Filed Vitals:   04/08/15 1754 04/08/15 2135 04/09/15 0500 04/09/15 0819  BP: 116/71 118/76 118/85 125/88  Pulse: 81 73 76 85  Temp: 98.6 F (37 C) 98.6 F (37 C) 98.9 F (37.2 C) 98.4 F (36.9 C)  TempSrc: Oral Oral Oral Oral  Resp: '18 16 18   ' Height:      Weight:      SpO2: 99% 96% 100% 99%    Intake/Output Summary (Last 24 hours) at 04/09/15 1655 Last data filed at 04/09/15 0824  Gross per 24 hour  Intake    550 ml  Output      0 ml  Net    550 ml   Weight change: 0.8 kg (1 lb 12.2 oz) Exam:   General:  Pt is alert, follows commands appropriately, not in acute distress  HEENT: No icterus, No thrush, No neck mass, [place]/AT  Cardiovascular: RRR, S1/S2, no rubs, no gallops  Respiratory: CTA bilaterally, no wheezing, no crackles, no rhonchi  Abdomen: Soft/+BS, non tender, non distended, no guarding  Extremities: No edema, No lymphangitis, No petechiae, No  rashes, no synovitis  Data Reviewed: Basic Metabolic Panel:  Recent Labs Lab 04/05/15 2049 04/06/15 0515 04/06/15 0930 04/07/15 0300  NA 134* 132*  --  133*  K 4.2 4.4  --  4.2  CL 92* 89*  --  89*  CO2 30 30  --  29  GLUCOSE 102* 99  --  85  BUN 23* 26*  --  39*  CREATININE 10.90* 11.71*  --  14.55*  CALCIUM 9.8 9.2  --  9.5  PHOS  --   --  7.0*  --    Liver Function Tests:  Recent Labs Lab 04/06/15 0515  AST 14*  ALT 9*  ALKPHOS 54  BILITOT 1.5*  PROT 6.8  ALBUMIN 2.5*   No results for input(s): LIPASE, AMYLASE in the last 168 hours. No results for input(s): AMMONIA in the last 168 hours. CBC:  Recent Labs Lab 04/05/15 2049 04/06/15 0515 04/07/15 0300  WBC 11.5* 11.0* 8.8  NEUTROABS  --  8.6*  --   HGB 8.4* 8.0* 7.9*  HCT 25.6* 23.9* 23.5*  MCV 91.8 91.6 92.5  PLT 287 313 213   Cardiac Enzymes: No results for input(s): CKTOTAL, CKMB, CKMBINDEX, TROPONINI in the last 168 hours. BNP: Invalid input(s): POCBNP CBG: No results for input(s): GLUCAP in the last 168 hours.  Recent Results (from the past 240 hour(s))  Blood culture (routine x 2)     Status: None (Preliminary result)   Collection Time: 04/05/15 10:26 PM  Result Value Ref Range Status   Specimen Description BLOOD RIGHT ARM  Final   Special Requests BOTTLES DRAWN AEROBIC AND ANAEROBIC 5CC  Final   Culture NO GROWTH 3 DAYS  Final   Report Status PENDING  Incomplete  Culture, blood (single)  Status: None (Preliminary result)   Collection Time: 04/05/15 10:28 PM  Result Value Ref Range Status   Specimen Description BLOOD RIGHT FOREARM  Final   Special Requests BOTTLES DRAWN AEROBIC ONLY 10CC  Final   Culture NO GROWTH 3 DAYS  Final   Report Status PENDING  Incomplete  Blood culture (routine x 2)     Status: None (Preliminary result)   Collection Time: 04/05/15 10:30 PM  Result Value Ref Range Status   Specimen Description BLOOD RIGHT WRIST  Final   Special Requests BOTTLES DRAWN  AEROBIC AND ANAEROBIC 5CC  Final   Culture NO GROWTH 3 DAYS  Final   Report Status PENDING  Incomplete  Culture, sputum-assessment     Status: None   Collection Time: 04/08/15  7:46 AM  Result Value Ref Range Status   Specimen Description SPUTUM  Final   Special Requests NONE  Final   Sputum evaluation   Final    MICROSCOPIC FINDINGS SUGGEST THAT THIS SPECIMEN IS NOT REPRESENTATIVE OF LOWER RESPIRATORY SECRETIONS. PLEASE RECOLLECT. Gram Stain Report Called to,Read Back By and Verified With: K. Johnney Ou AT 0825 ON [id number] BY Rhea Bleacher    Report Status 04/08/2015 FINAL  Final  AFB culture with smear     Status: None (Preliminary result)   Collection Time: 04/08/15  7:46 AM  Result Value Ref Range Status   Specimen Description SPUTUM  Final   Special Requests NONE  Final   Acid Fast Smear   Final    NO ACID FAST BACILLI SEEN Performed at [facility]    Culture   Final    CULTURE WILL BE EXAMINED FOR 6 WEEKS BEFORE ISSUING A FINAL REPORT Performed at [facility]    Report Status PENDING  Incomplete     Scheduled Meds: . amiodarone  200 mg Oral Daily  . calcitRIOL  2.25 mcg Oral Q T,Th,Sat-1800  . carvedilol  6.25 mg Oral BID WC  .  ceFAZolin (ANCEF) IV  2 g Intravenous Q T,Th,Sa-HD  . cinacalcet  90 mg Oral BID WC  . darbepoetin (ARANESP) injection - DIALYSIS  150 mcg Intravenous Q Thu-HD  . feeding supplement (NEPRO CARB STEADY)  237 mL Oral BID BM  . [START ON 04/11/2015] ferric gluconate (FERRLECIT/NULECIT) IV  125 mg Intravenous Q T,Th,Sa-HD  . lisinopril  20 mg Oral Daily  . multivitamin  1 tablet Oral QHS  . sevelamer carbonate  2,400 mg Oral TID WC  . vancomycin  1,000 mg Intravenous Q T,Th,Sa-HD  . warfarin  7.5 mg Oral ONCE-1800  . Warfarin - Pharmacist Dosing Inpatient   Does not apply q1800   Continuous Infusions:    Estuardo Frisbee, DO  [facility] Pager [phone]  If 7PM-7AM, please contact night-coverage [email or web address] Password  [email or web address] 04/09/2015, 4:55 PM   LOS: 4 days

## 2015-04-09 NOTE — Progress Notes (Signed)
ANTICOAGULANT CONSULT NOTE - FOLLOW UP  Pharmacy Consult for Coumadin Indication: hx afib  No Known Allergies  Patient Measurements: Height: 5\' 11"  (180.3 cm) Weight: 217 lb 9.6 oz (98.703 kg) IBW/kg (Calculated) : 75.3  Vital Signs: Temp: 98.4 F (36.9 C) (10/02 0819) Temp Source: Oral (10/02 0819) BP: 125/88 mmHg (10/02 0819) Pulse Rate: 85 (10/02 0819) Intake/Output from previous day: 10/01 0701 - 10/02 0700 In: 810 [P.O.:360; IV Piggyback:450] Out: 2800  Intake/Output from this shift: Total I/O In: 100 [P.O.:100] Out: -   Labs:  Recent Labs  04/07/15 0300  WBC 8.8  HGB 7.9*  PLT 213  CREATININE 14.55*   Lab Results  Component Value Date   INR 1.72* 04/09/2015   INR 1.67* 04/08/2015   INR 2.13* 04/07/2015     Estimated Creatinine Clearance: 7.5 mL/min (by C-G formula based on Cr of 14.55). No results for input(s): VANCOTROUGH, VANCOPEAK, VANCORANDOM, GENTTROUGH, GENTPEAK, GENTRANDOM, TOBRATROUGH, TOBRAPEAK, TOBRARND, AMIKACINPEAK, AMIKACINTROU, AMIKACIN in the last 72 hours.   Microbiology: Recent Results (from the past 720 hour(s))  Blood culture (routine x 2)     Status: None (Preliminary result)   Collection Time: 04/05/15 10:26 PM  Result Value Ref Range Status   Specimen Description BLOOD RIGHT ARM  Final   Special Requests BOTTLES DRAWN AEROBIC AND ANAEROBIC 5CC  Final   Culture NO GROWTH 3 DAYS  Final   Report Status PENDING  Incomplete  Culture, blood (single)     Status: None (Preliminary result)   Collection Time: 04/05/15 10:28 PM  Result Value Ref Range Status   Specimen Description BLOOD RIGHT FOREARM  Final   Special Requests BOTTLES DRAWN AEROBIC ONLY 10CC  Final   Culture NO GROWTH 3 DAYS  Final   Report Status PENDING  Incomplete  Blood culture (routine x 2)     Status: None (Preliminary result)   Collection Time: 04/05/15 10:30 PM  Result Value Ref Range Status   Specimen Description BLOOD RIGHT WRIST  Final   Special Requests  BOTTLES DRAWN AEROBIC AND ANAEROBIC 5CC  Final   Culture NO GROWTH 3 DAYS  Final   Report Status PENDING  Incomplete  Culture, sputum-assessment     Status: None   Collection Time: 04/08/15  7:46 AM  Result Value Ref Range Status   Specimen Description SPUTUM  Final   Special Requests NONE  Final   Sputum evaluation   Final    MICROSCOPIC FINDINGS SUGGEST THAT THIS SPECIMEN IS NOT REPRESENTATIVE OF LOWER RESPIRATORY SECRETIONS. PLEASE RECOLLECT. Gram Stain Report Called to,Read Back By and Verified With: Richardson Chiquito AT 0825 ON [id number] BY Rhea Bleacher    Report Status 04/08/2015 FINAL  Final    Anti-infectives    Start     Dose/Rate Route Frequency Ordered Stop   04/07/15 1445  ceFAZolin (ANCEF) IVPB 2 g/50 mL premix     2 g 100 mL/hr over 30 Minutes Intravenous NOW 04/07/15 1436 04/07/15 1731   04/06/15 1200  vancomycin (VANCOCIN) IVPB 1000 mg/200 mL premix     1,000 mg 200 mL/hr over 60 Minutes Intravenous Every T-Th-Sa (Hemodialysis) 04/06/15 0043     04/06/15 1200  ceFAZolin (ANCEF) IVPB 2 g/50 mL premix     2 g 100 mL/hr over 30 Minutes Intravenous Every T-Th-Sa (Hemodialysis) 04/06/15 1012     04/06/15 0045  vancomycin (VANCOCIN) IVPB 1000 mg/200 mL premix     1,000 mg 200 mL/hr over 60 Minutes Intravenous  Once 04/06/15 0043 04/06/15 0215  04/06/15 0045  cefTAZidime (FORTAZ) 2 g in dextrose 5 % 50 mL IVPB  Status:  Discontinued     2 g 100 mL/hr over 30 Minutes Intravenous Every T-Th-Sa (1800) 04/06/15 0043 04/06/15 1012   04/05/15 2215  vancomycin (VANCOCIN) IVPB 1000 mg/200 mL premix     1,000 mg 200 mL/hr over 60 Minutes Intravenous  Once 04/05/15 2203 04/06/15 0215   04/05/15 2215  piperacillin-tazobactam (ZOSYN) IVPB 3.375 g     3.375 g 100 mL/hr over 30 Minutes Intravenous  Once 04/05/15 2203 04/05/15 2303      Assessment: 49 yo M admitted with SOB, fever/Chills, patchy ground glass opacities/pulmonary lung nodule and concern for new onset infection on Vancomycin  and Ancef with HD. Typical HD days and TTS.  Pt also on Coumadin PTA for hx afib.  INR reported as >10 on 9/28 and pt received FFP and 5mg  PO Vit K.  INR trending up after Coumadin restarted.  Will follow.  Goal of Therapy:  INR goal 2-3  Plan:  Coumadin 7.5mg  PO x 1 tonight Daily INR  [employer], Pharm.D., BCPS Clinical Pharmacist Pager [phone] 04/09/2015 12:34 PM

## 2015-04-09 NOTE — Progress Notes (Signed)
[name] Progress Note  Assessment/Plan: 1. 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 ESRD -TTS - next HD Tuesday - will have lower EDW at d/c - needs labs drawn on HD 3. Anemia Fe deficiency- Hgb 7.9, stable- last Mircera 150 9/20 -  given Aranesp 150 9/30 tsat 8% total Fe only 14 with ferritin 1337 - replete short course Fe- needs labs drawn on HD 4. Secondary hyperparathyroidism - uncontrolled iPTH 1470 when last checked - continue calcitriol, binders/sensipar 5. HTN/volume - post HD standing weight 10/1 as 98.7; BP ok - BP much improved! 6. Nutrition - losing weight alb 2.5; EDW recently lowered 2 kg and now below new edw- add supplements 7. Renal mass -evaluation by Dr. Wrenn suggest he has probably hemoorhage into one or more of his acquired renal cysts. Rec ReCT in 3-4 weeks 8. 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 Coagulopathy- INR 1.72 10/1, 2.13 9/30- ok; INR 9/29 ? error 10. PAF-on amio  Martha B Bergman, PA-C [name] Beeper [phone] 04/09/2015,8:38 AM  LOS: 4 days  I have seen and examined this patient and agree with plan per Marty Bergman.  Breathing better and coughing less.  BC neg so far.  ? What is the game plan.  If it is continued AB and FU CT in few weeks then he can get AB as outpt MATTINGLY,MICHAEL T,MD 04/09/2015 9:11 AM Subjective:   Feels a little better today  Objective Filed Vitals:   04/08/15 1754 04/08/15 2135 04/09/15 0500 04/09/15 0819  BP: 116/71 118/76 118/85 125/88  Pulse: 81 73 76 85  Temp: 98.6 F (37 C) 98.6 F (37 C) 98.9 F (37.2 C) 98.4 F (36.9 C)  TempSrc: Oral Oral Oral Oral  Resp: 18 16 18   Height:      Weight:      SpO2: 99% 96% 100% 99%   Physical Exam General: NAD eating breakfast Heart: RRR  2/6 murmur Lungs: clear without rales Abdomen: soft NT Extremities: no LE edema Dialysis Access: left lower AVF + bruit  Dialysis Orders: [facility] TTS, 4hr, BFR 500 with 14g needles, DW 103kg, 2K 2 Ca, No Na modeling, micera150mcg q 2 weeks, calcitriol 2.25mcg TTS  Additional Objective Labs: Basic Metabolic Panel:  Recent Labs Lab 04/05/15 2049 04/06/15 0515 04/06/15 0930 04/07/15 0300  NA 134* 132*  --  133*  K 4.2 4.4  --  4.2  CL 92* 89*  --  89*  CO2 30 30  --  29  GLUCOSE 102* 99  --  85  BUN 23* 26*  --  39*  CREATININE 10.90* 11.71*  --  14.55*  CALCIUM 9.8 9.2  --  9.5  PHOS  --   --  7.0*  --    Liver Function Tests:  Recent Labs Lab 04/06/15 0515  AST 14*  ALT 9*  ALKPHOS 54  BILITOT 1.5*  PROT 6.8  ALBUMIN 2.5*   CBC:  Recent Labs Lab 04/05/15 2049 04/06/15 0515 04/07/15 0300  WBC 11.5* 11.0* 8.8  NEUTROABS  --  8.6*  --   HGB 8.4* 8.0* 7.9*  HCT 25.6* 23.9* 23.5*  MCV 91.8 91.6 92.5  PLT 287 313 213   Blood Culture    Component Value Date/Time   SDES SPUTUM 04/08/2015   0746   SPECREQUEST NONE 04/08/2015 0746   CULT NO GROWTH 3 DAYS 04/05/2015 2230   REPTSTATUS 04/08/2015 FINAL 04/08/2015 0746   Iron Studies:  Recent Labs  04/08/15 0815  IRON 14*  TIBC 175*  FERRITIN 1337*   Lab Results  Component Value Date   INR 1.72* 04/09/2015   INR 1.67* 04/08/2015   INR 2.13* 04/07/2015    Medications:   . amiodarone  200 mg Oral Daily  . calcitRIOL  2.25 mcg Oral Q T,Th,Sat-1800  . carvedilol  6.25 mg Oral BID WC  .  ceFAZolin (ANCEF) IV  2 g Intravenous Q T,Th,Sa-HD  . cinacalcet  90 mg Oral BID WC  . darbepoetin (ARANESP) injection - DIALYSIS  150 mcg Intravenous Q Thu-HD  . feeding supplement (NEPRO CARB STEADY)  237 mL Oral BID BM  . lisinopril  20 mg Oral Daily  . multivitamin  1 tablet Oral QHS  . sevelamer carbonate  2,400 mg Oral TID WC  . vancomycin  1,000 mg Intravenous Q T,Th,Sa-HD  . Warfarin - Pharmacist Dosing  Inpatient   Does not apply q1800               

## 2015-04-10 DIAGNOSIS — R599 Enlarged lymph nodes, unspecified: Secondary | ICD-10-CM

## 2015-04-10 LAB — CULTURE, BLOOD (ROUTINE X 2)
CULTURE: NO GROWTH
CULTURE: NO GROWTH

## 2015-04-10 LAB — PROTIME-INR
INR: 1.88 — ABNORMAL HIGH (ref 0.00–1.49)
PROTHROMBIN TIME: 21.5 s — AB (ref 11.6–15.2)

## 2015-04-10 LAB — CULTURE, BLOOD (SINGLE): Culture: NO GROWTH

## 2015-04-10 MED ORDER — WARFARIN SODIUM 7.5 MG PO TABS
7.5000 mg | ORAL_TABLET | Freq: Once | ORAL | Status: AC
  Administered 2015-04-10: 7.5 mg via ORAL
  Filled 2015-04-10: qty 1

## 2015-04-10 NOTE — Progress Notes (Signed)
Name: Timothy Palmer MRN: [id number] DOB: [birth year]    ADMISSION DATE:  04/05/2015 CONSULTATION DATE:  9/29  REFERRING MD :  [facility]   CHIEF COMPLAINT:  Cavitary lung nodule   BRIEF PATIENT DESCRIPTION: 49yo with hx ESRD, HTN, chronic dCHF with hx mitral valve endocarditis with valve replacement 2015, Afib on coumadin.  Presented 9/28 with 2 weeks hx cough, SOB, night sweats, fever.  Abnormal CXR prompted CT chest which revealed cavitary lung lesion and PCCM consulted  SIGNIFICANT EVENTS  9/28 - Admitted to hospital  STUDIES:  CT CHEST W/ 9/28 - Several (at least 10) new subsolid pulmonary nodules scattered throughout both lungs, largest 1.6 cm in the right upper lobe, including a cavitary 1.5 cm left upper lobe pulmonary nodule.  Progressive calcified pleural plaques in the right pleural space.  Incompletely visualized indeterminate hypodense mass in the left upper quadrant of the abdomen, which appears to arise from the lateral upper left kidney, measures at least 5.1 cm in size and appears new since the 03/24/2014 CT abdomen study.  CT ABDOMEN/PELVIS W/ & W/O 9/29 - 2 complex lesions left kidney without enhancement most consistent with hemorrhagic or infected cysts. Neoplasm unlikely. Small enhancing nodule right kidney.  SUBJECTIVE: Patient reports cough is minimal. Denies any dyspnea at rest. Denies any chest pain or pressure.  REVIEW OF SYSTEMS: Denies any nausea, vomiting, or diarrhea. No abdominal pain. No subjective fever or chills. He is having intermittent sweats.  VITAL SIGNS: Temp:  [98 F (36.7 C)-98.5 F (36.9 C)] 98 F (36.7 C) (10/03 0913) Pulse Rate:  [72-79] 72 (10/03 0913) Resp:  [18-19] 18 (10/03 0913) BP: (124-132)/(75-87) 124/75 mmHg (10/03 0913) SpO2:  [94 %-100 %] 100 % (10/03 0913) Weight:  [219 lb 9.3 oz (99.6 kg)] 219 lb 9.3 oz (99.6 kg) (10/02 2100)  PHYSICAL EXAMINATION: General:  Patient laying in bed with lights off. No distress. Sleeping until  awoken by me. Integument: Warm and dry. No rash on exposed skin.  HEENT:  Moist mucus membranes. No oral ulcers. No scleral injection or icterus.  Cardiovascular:  Regular rate. No edema. No appreciable JVD.  PulmonaryClear bilaterally to auscultationymmetric chest wall expansion. No accessory muscle use. Abdomen: Soft. Normal bowel sounds. Mildly protuberant. Grossly nontender. Musculoskeletal:  Normal bulk and tone. Hand grip strength 5/5 bilaterally. No joint deformity or effusion appreciated.   Recent Labs Lab 04/05/15 2049 04/06/15 0515 04/07/15 0300  NA 134* 132* 133*  K 4.2 4.4 4.2  CL 92* 89* 89*  CO2 30 30 29   BUN 23* 26* 39*  CREATININE 10.90* 11.71* 14.55*  GLUCOSE 102* 99 85    Recent Labs Lab 04/05/15 2049 04/06/15 0515 04/07/15 0300  HGB 8.4* 8.0* 7.9*  HCT 25.6* 23.9* 23.5*  WBC 11.5* 11.0* 8.8  PLT 287 313 213   No results found.  MICROBIOLOGY SPUTUM AFB 10/3>>> SPUTUM AFB 10/2>>>smear neg/pending SPUTUM AFB 10/1>>>smear neg/pending  QUANTIFERON-TB 9/29 - negative HIV - negative  BLOOD CTX 9/28>>>  ANTIBIOTICS Cefazolin 9/29 >>> Vancomycin 9/29 >>>  ASSESSMENT / PLAN: 49 year old male with history of bacteremia presenting with cavitary lung lesion & mediastinal adenopathy. Adenopathy has been present for some time going back to September 2015. This is of unclear significance. Reviewing the radiology reports from the CT scan of the abdomen and pelvis the patient has a small enhancing lesion within his right kidney by what appears to be hemorrhagic or infected cysts within the left kidney. An infectious etiology is still favored. Patient is  currently on broad-spectrum antibiotics. It would be unlikely this was reactivation tuberculosis given the negative QuantiFERON-TB. An atypical mycobacterial infection is certainly possible but septic emboli is more likely.   1. Cavitary lung lesion: Awaiting third sputum AFB. Reviewing primary team's notes they  plan for transesophageal echocardiogram to evaluate for possible endocarditis. Antibiotics were primary service. 2. 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 I am in agreement with prior pulmonary physicians that biopsy would be of low yield.  Sonia Baller Ashok Cordia, M.D. [facility] Pulmonary & Critical Care Pager:  [phone] After 3pm or if no response, call [phone] Pager # [phone] OR # [phone] if no answer

## 2015-04-10 NOTE — Progress Notes (Signed)
    [facility] has been requested to perform a transesophageal echocardiogram on 10/3 to r/o endocarditis.  After careful review of history and examination, the risks and benefits of transesophageal echocardiogram have been explained including risks of esophageal damage, perforation (1:10,000 risk), bleeding, pharyngeal hematoma as well as other potential complications associated with conscious sedation including aspiration, arrhythmia, respiratory failure and death. Alternatives to treatment were discussed, questions were answered. Patient is willing to proceed.   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 Blood culture negative x 2 and AFB negative so far. Per IM, request TEE to r/o endocaritis, if TEE and AFB negative, will discontinue abx.   Of note, pt has chronic anemia with hgb ranging between 7 to 9 since 2015, currently hgb 7.9. No obvious bleeding, felt to be anemia of chronic dx. Platelet 213. SBP 110-130s, not requiring pressor. INR 1.88 on coumadin. Other than anemia, no other obvious contraindication to TEE, will ask MD to review hgb in AM. Will obtain hgb and hct in AM.      Timothy Deforest, PA-C 04/10/2015 4:27 PM

## 2015-04-10 NOTE — Progress Notes (Signed)
Subjective:   Feeling much better. Appetite improving. No complaints  Objective Filed Vitals:   04/09/15 1810 04/09/15 2100 04/10/15 0500 04/10/15 0913  BP: 132/86 130/87 129/86 124/75  Pulse: 79 74 72 72  Temp: 98.2 F (36.8 C) 98.2 F (36.8 C) 98.5 F (36.9 C) 98 F (36.7 C)  TempSrc: Oral Oral Oral Oral  Resp: '19 18 18 18  ' Height:      Weight:  99.6 kg (219 lb 9.3 oz)    SpO2: 94% 100% 100% 100%   Physical Exam General: sitting up on side of bed, alert and oriented, no acute distress.  Heart: RRR 2/6 murmur Lungs: CTA, unlabored  Abdomen: soft, nontender +BS  Extremities: no edema.  Dialysis Access:  L AVF +b/t  Dialysis Orders: [facility] T[facility], 4hr, BFR 500 with 14g needles, DW 103kg, 2K 2 Ca, No Na modeling, micera11mg q 2 weeks, calcitriol 2.260m TTS  Assessment/Plan: 1. 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 ESRD -TTS - next HD Tuesday - will have lower EDW at d/c - needs labs drawn on HD 3. Anemia Fe deficiency- Hgb 7.9, stable- last Mircera 150 9/20 - given Aranesp 150 9/30 tsat 8% total Fe only 14 with ferritin 1337 - replete short course Fe- needs labs drawn on HD 4. Secondary hyperparathyroidism - uncontrolled iPTH 1470 when last checked - continue calcitriol, binders/sensipar- phos 7 5. HTN/volume - post HD standing weight 10/1 as 98.7; BP ok - BP much improved! Lower edw at DC 6. Nutrition - losing weight alb 2.5; EDW recently lowered 2 kg and now below new edw- add supplements 7. Renal mass -evaluation by Dr. WrJeffie Pollockuggest he has probably hemoorhage into one or more of his acquired renal cysts. Rec ReCT in 3-4 weeks 8. 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 Coagulopathy- INR 1.88r 10. PAF-on amio and  coumadin  BrShelle IronNP [phone]/09/2014,10:04 AM  LOS: 5 days   Pt seen, examined and agree w A/P as above.  RoKelly SplinterD pager [phone]  cell [phone] 92270/09/2014, 1:21 PM    Additional Objective Labs: Basic Metabolic Panel:  Recent Labs Lab 04/05/15 2049 04/06/15 0515 04/06/15 0930 04/07/15 0300  NA 134* 132*  --  133*  K 4.2 4.4  --  4.2  CL 92* 89*  --  89*  CO2 30 30  --  29  GLUCOSE 102* 99  --  85  BUN 23* 26*  --  39*  CREATININE 10.90* 11.71*  --  14.55*  CALCIUM 9.8 9.2  --  9.5  PHOS  --   --  7.0*  --    Liver Function Tests:  Recent Labs Lab 04/06/15 0515  AST 14*  ALT 9*  ALKPHOS 54  BILITOT 1.5*  PROT 6.8  ALBUMIN 2.5*   No results for input(s): LIPASE, AMYLASE in the last 168 hours. CBC:  Recent Labs Lab 04/05/15 2049 04/06/15 0515 04/07/15 0300  WBC 11.5* 11.0* 8.8  NEUTROABS  --  8.6*  --   HGB 8.4* 8.0* 7.9*  HCT 25.6* 23.9* 23.5*  MCV 91.8 91.6 92.5  PLT 287 313 213   Blood Culture    Component Value Date/Time   SDES SPUTUM 04/08/2015 0746  SDES SPUTUM 04/08/2015 0746   SPECREQUEST NONE 04/08/2015 0746   SPECREQUEST NONE 04/08/2015 0746   CULT  04/08/2015 0746    CULTURE WILL BE EXAMINED FOR 6 WEEKS BEFORE ISSUING A FINAL REPORT Performed at [place] 04/08/2015 FINAL 04/08/2015 0746   REPTSTATUS PENDING 04/08/2015 0746    Cardiac Enzymes: No results for input(s): CKTOTAL, CKMB, CKMBINDEX, TROPONINI in the last 168 hours. CBG: No results for input(s): GLUCAP in the last 168 hours. Iron Studies:  Recent Labs  04/08/15 0815  IRON 14*  TIBC 175*  FERRITIN 1337*   '@lablastinr3' @ Studies/Results: No results found. Medications:   . amiodarone  200 mg Oral Daily  . calcitRIOL  2.25 mcg Oral Q T,Th,Sat-1800  . carvedilol  6.25 mg Oral BID WC  .  ceFAZolin (ANCEF) IV  2 g Intravenous Q T,Th,Sa-HD  . cinacalcet  90 mg Oral BID WC  .  darbepoetin (ARANESP) injection - DIALYSIS  150 mcg Intravenous Q Thu-HD  . feeding supplement (NEPRO CARB STEADY)  237 mL Oral BID BM  . [START ON 04/11/2015] ferric gluconate (FERRLECIT/NULECIT) IV  125 mg Intravenous Q T,Th,Sa-HD  . lisinopril  20 mg Oral Daily  . multivitamin  1 tablet Oral QHS  . sevelamer carbonate  2,400 mg Oral TID WC  . vancomycin  1,000 mg Intravenous Q T,Th,Sa-HD  . Warfarin - Pharmacist Dosing Inpatient   Does not apply [phone]

## 2015-04-10 NOTE — Care Management Important Message (Signed)
Important Message  Patient Details  Name: Timothy Palmer MRN: [id number] Date of Birth: 1966/06/15   Medicare Important Message Given:  Yes-second notification given    RoyalRory Percy, RN 04/10/2015, 10:55 AM

## 2015-04-10 NOTE — Progress Notes (Signed)
ANTICOAGULATION CONSULT NOTE - Follow Up Consult  Pharmacy Consult for Coumadin Indication: atrial fibrillation  No Known Allergies  Patient Measurements: Height: 5\' 11"  (180.3 cm) Weight: 219 lb 9.3 oz (99.6 kg) IBW/kg (Calculated) : 75.3  Vital Signs: Temp: 98 F (36.7 C) (10/03 0913) Temp Source: Oral (10/03 0913) BP: 124/75 mmHg (10/03 0913) Pulse Rate: 72 (10/03 0913)  Labs:  Recent Labs  04/08/15 0815 04/09/15 0340 04/10/15 0426  LABPROT 19.7* 20.1* 21.5*  INR 1.67* 1.72* 1.88*    Estimated Creatinine Clearance: 7.5 mL/min (by C-G formula based on Cr of 14.55).  Assessment: 49 yo M admitted with SOB, fever/chills, patchy ground glass opacities/pulmonary lung nodule and concern for new onset infection on Vancomycin and Ancef with HD. Typical HD days and TTS.  Pt also on Coumadin PTA for hx afib. INR reported as >10 on 9/28 and pt received FFP and 5mg  PO Vit K. INR trending up after Coumadin restarted but remains subtherapeutic at 1.88. No bleeding noted.  Home dose of warfarin is 7.5 mg on Sunday and 10 mg all other days.  Goal of Therapy:  INR 2-3 Monitor platelets by anticoagulation protocol: Yes   Plan:  - Coumadin 7.5 mg PO tonight - INR daily  [facility], Pharm.D., BCPS Clinical Pharmacist Pager: [phone] 04/10/2015 11:21 AM

## 2015-04-10 NOTE — Progress Notes (Signed)
PROGRESS NOTE  Timothy Palmer YIF:[id number] DOB: 01-26-1966 DOA: 04/05/2015 PCP: Louis Meckel, MD  Brief history 49 year old male with history of ESRD, hypertension, MSSA bacteremia with mitral valve endocarditis, Atrial fibrillation, hypertension presented with 2 weeks of worsening cough with yellow-green sputum as well as subjective fevers and chills. The patient had an abnormal chest x-ray in outpatient setting. As result CT of the chest with intravenous contrast was obtained. 04/05/2015 CT of the chest revealed bilateral mediastinal and hilar lymphadenopathy as well as improving bilateral patchy ground glass opacities. There was a new left upper lobe 1.5 cm cavitary nodule. As result, the patient was advised to come to the hospital for further workup.  Notably, the patient had MSSA bacteremia back in May 2015 and August 2015. 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 He subsequently underwent a below the knee left popliteal bypass and embolectomy. The patient completed 6 weeks of cefazolin after his mitral valve replacement. Pt states he last smoked marijuana 2 weeks ago and has remote hx of tobacco for which he only smoked 2 years over 15 yrs ago. Assessment/Plan: Cavitary lung nodule -Patient has bilateral mediastinal and hilar lymphadenopathy -Given the patient's clinical history concerning for septic emboli -Other considerations include atypical mycobacterial infection, malignancy, and fungal infection -Clinical suspicion for MTB is low--consider discontinuation respiratory isolation if okay with ID -Appreciate pulmonary--spoke with Dr. Eula Fried not amendable to bronchoscopy-->PET in out pt setting -Continue  respiratory isolation--sputum for AFB x 3 ( 2 samples neg so far) -Quantiferon TB--negative -blood cultures--neg to date x 5 days -ID following -TEE once AFB neg x 3 (scheduled for Tues 04/12/15) -if TEE and AFB neg--possible d/c abx and observe -check ACE -ESR--126 ESRD  -Appreciate nephrology followup -Dialysis on Tuesday, Thursday, Saturdays  Status post mitral valve replacement  -Status post mitral valve replacement--04/05/2014 secondary to endocarditis with history of MSSA bacteremia  -Repeat echocardiogram (TTE)--no vegetation  Coagulopathy  -INR >10 -vitamin K x 1-->INR 2.1-->1.63 -likely due to acute infection -check fibrinogen, peripheral smear--not consistent with DIC -Restarted coumadin  -recheck INR in am Renal Mass -CT abd/pelvis with renal mass protocol--large left complex lesion with perinephric stranding--Hemorrhagic cyst -appreciate urology opinion--repeat CT in 2-3 weeks Paroxysmal atrial fibrillation  -This occurred postoperatively  -Restarted warfarin -Continue amiodarone History of MSSA mitral valve endocarditis (native valve) -Finished 6 weeks of cefazolin after mitral valve replacement  HTN -continue coreg and lisinopril   Family Communication: Pt at beside Disposition Plan: Home when medically stable   Procedures/Studies: Ct Abdomen Pelvis W Wo Contrast  04/06/2015   CLINICAL DATA:  49 year old hemodialysis patient with left upper quadrant abdominal mass on chest CT, incompletely visualized. Chest CT performed for cough, weakness and anorexia.  EXAM: CT ABDOMEN AND PELVIS WITHOUT AND WITH CONTRAST  TECHNIQUE: Multidetector CT imaging of the abdomen and pelvis was performed following the standard protocol before and following the bolus administration of intravenous contrast.  CONTRAST:  138m OMNIPAQUE IOHEXOL 300 MG/ML  SOLN  COMPARISON:  Chest CT 04/05/2015.  Abdominal CTA 03/24/2014.  FINDINGS: Lower chest: The visualized lung bases are  stable from yesterday's chest CT. There is pleural thickening and calcified pleural plaque formation on the right. Prosthetic mitral valve noted.  Hepatobiliary: The  liver is normal in density without focal abnormality. There are multiple calcified gallstones. The gallbladder is contracted with mild wall thickening, but no surrounding inflammation. There is no gallbladder wall thickening or evidence of choledocholithiasis.  Pancreas: Unremarkable. No pancreatic ductal dilatation or surrounding inflammatory changes.  Spleen: Normal in size without focal abnormality.  Adrenals/Urinary Tract: Both adrenal glands appear normal. Both kidneys are enlarged by multiple cystic lesions consistent with adult polycystic kidney disease. On the right, a few these demonstrate thin peripheral calcifications. There is an area of ill-defined enhancement medially in the lower pole of the right kidney, measuring approximately 11 mm on image 89 of series 7. This enhances from 48 HU prior to contrast to 168 HU on the immediate postcontrast images. No other suspicious findings are present on the right. The left kidney demonstrates 2 complex lesions and diffuse perinephric soft tissue stranding. In the upper interpolar region, there is a 5.7 x 6.8 cm lesion on image 63 of series 2 which does not show any significant enhancement, measuring 39 HU prior to contrast, 39 HU on the immediate postcontrast images and 39 HU on the delayed images. In the lower pole, there is a large irregular lesion measuring 10.2 x 6.9 cm on image 84 of series 2. This does not show significant enhancement either, measuring 46 HU, 51 HU and 56 HU on the 3 phases. There is no definite enhancing renal lesion or hydronephrosis. There is no contrast excretion following contrast. The bladder demonstrates intermediate density within its lumen, but no apparent focal abnormality.  Stomach/Bowel: No evidence of bowel wall thickening, distention or surrounding inflammatory  change.  Vascular/Lymphatic: There are no enlarged abdominal or pelvic lymph nodes. Mild aortoiliac atherosclerosis appears unchanged.  Reproductive: There is mild enlargement of the prostate gland which demonstrates central dystrophic calcifications. There are prominent calcifications of the vas deferens.  Other: Small umbilical hernia containing fat.  Musculoskeletal: No acute or significant osseous findings. Degenerative disc disease noted at the lumbosacral junction.  IMPRESSION: 1. There are 2 large complex lesions involving the interpolar and lower pole regions of the left kidney with associated perinephric soft tissue stranding. Neither lesion demonstrates significant enhancement. Findings are most consistent with hemorrhagic or infected cysts. Neoplasm is unlikely given the lack of significant enhancement. 2. There is a small enhancing nodule inferomedially in the right kidney which is not well-defined, although could reflect a small tumor or vascular lesion. 3. No evidence of obstructing calculus. 4. Cholelithiasis. 5. If there is clinical concern of an infected renal cyst, cyst aspiration under ultrasound could be attempted. Otherwise, management options include CT or MR follow-up of the bilateral lesions.   Electronically Signed   By: Richardean Sale M.D.   On: 04/06/2015 21:57   Dg Chest 2 View  03/19/2015   CLINICAL DATA:  Chest pain  EXAM: CHEST  2 VIEW  COMPARISON:  05/30/2014  FINDINGS: Stable mild cardiomegaly and vascular pedicle widening in this patient post mitral valve repair.  Pleural and parenchymal scarring on the right with volume loss which is presumably postoperative.  Subtle small density in the peripheral left upper chest near the posterior fifth rib is likely summation shadows with the scapula. There is no edema, consolidation, effusion, or pneumothorax.  No acute osseous finding to explain chest pain.  IMPRESSION: 1. No active cardiopulmonary disease. 2. Stable pleural parenchymal  scarring on the right.   Electronically Signed   By: Monte Fantasia M.D.   On: 03/19/2015 17:58   Ct Chest  W Contrast  04/05/2015   CLINICAL DATA:  Cough. Weakness. Anorexia for 2.5 weeks. Prior mitral valve replacement.  EXAM: CT CHEST WITH CONTRAST  TECHNIQUE: Multidetector CT imaging of the chest was performed during intravenous contrast administration.  CONTRAST:  6m ISOVUE-300 IOPAMIDOL (ISOVUE-300) INJECTION 61%  COMPARISON:  03/22/2014 chest CT angiogram. 03/19/2015 chest radiograph.  FINDINGS: Mediastinum/Nodes: Mitral valve prosthesis appears well positioned. Mild cardiomegaly, decreased. No pericardial fluid/thickening. There is atherosclerosis of the thoracic aorta, the great vessels of the mediastinum and the coronary arteries, including calcified atherosclerotic plaque in the left anterior descending, left circumflex and right coronary arteries. Great vessels are normal in course and caliber. No central pulmonary emboli. There is a partially calcified 1.5 cm hypodense posterior left thyroid lobe nodule, not appreciably changed. Normal esophagus. No axillary lymphadenopathy. Stable mildly enlarged 1.4 cm upper right paratracheal node (series 3/image 11). Stable mildly enlarged 1.5 cm subcarinal node (3/32). Stable aortopulmonary window lymphadenopathy, with the largest node measuring 1.9 cm (3/22), stable. There are mildly enlarged hilar nodes bilaterally, including a 1.5 cm right hilar node (3/20) and a 1.1 cm left hilar node (3/29), stable.  Lungs/Pleura: No pneumothorax. There are calcified pleural plaques in the mid and basilar right pleural space with trace right pleural effusion/pleural thickening, decreased. One of the right hemidiaphragmatic calcified pleural plaques is new (series 4/image 39) . No left pleural effusion. There is patchy ground-glass opacity throughout both lungs, slightly less prominent in the interval. There is a 1.5 cm new cavitary nodule in the peripheral left upper lobe  (series 4/image 18). There is a new subsolid 1.6 x 1.4 cm medial right upper lobe pulmonary nodule (4/22). There are several additional sub cm sub solid new pulmonary nodules throughout both lungs (at least 10).  Upper abdomen: Multiple subcentimeter calcified gallstones are seen layering in the nondistended non thick walled gallbladder. No biliary ductal dilatation. Partially visualized is a polycystic left kidney. There is an indeterminate incompletely characterized and partially visualized exophytic hypodense mass in the lateral upper left kidney measuring at least 5.1 x 5.0 cm (series 3/image 66), which was not apparent on the 03/24/2014 CT abdomen study.  Musculoskeletal: No aggressive appearing focal osseous lesions. There is bilateral gynecomastia, asymmetric to the right, increased in the interval. Mild degenerative changes in the thoracic spine.  IMPRESSION: 1. Several (at least 10) new subsolid pulmonary nodules scattered throughout both lungs, largest 1.6 cm in the right upper lobe, including a cavitary 1.5 cm left upper lobe pulmonary nodule. Differential considerations include septic pulmonary emboli, fungal pneumonia and less likely pulmonary metastases or tuberculosis. Consider short-term follow-up post treatment chest CT in 4-6 weeks. 2. Mild cardiomegaly with patchy ground-glass opacity throughout both lungs, both which are decreased compared to the 03/22/2014 chest CT study, favor improved mild pulmonary edema. 3. Progressive calcified pleural plaques in the right pleural space. Trace right pleural effusion/thickening, decreased. 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 4. Stable nonspecific mediastinal and bilateral hilar lymphadenopathy. 5. Incompletely visualized indeterminate hypodense mass in the left upper quadrant of the abdomen, which appears to arise from the lateral upper left kidney, measures at least 5.1 cm in size and appears new since the 03/24/2014 CT  abdomen study. Recommend further evaluation with renal mass protocol CT or MRI of the abdomen with and without intravenous contrast. 6. Atherosclerosis, including three-vessel coronary artery disease. Please note that although the presence of coronary artery calcium documents the presence of coronary artery disease, the severity of this disease and  any potential stenosis cannot be assessed on this non-gated CT examination. Assessment for potential risk factor modification, dietary therapy or pharmacologic therapy may be warranted, if clinically indicated. 7. Cholelithiasis. These results were called by telephone at the time of interpretation on 04/05/2015 at 5:42 pm to Dr. Posey Pronto, who verbally acknowledged these results.   Electronically Signed   By: Ilona Sorrel M.D.   On: 04/05/2015 17:42         Subjective: Patient denies fevers, chills, headache, chest pain, dyspnea, nausea, vomiting, diarrhea, abdominal pain, dysuria, hematuria. He denies any hemoptysis but complains of a cough with white sputum   Objective: Filed Vitals:   04/09/15 1810 04/09/15 2100 04/10/15 0500 04/10/15 0913  BP: 132/86 130/87 129/86 124/75  Pulse: 79 74 72 72  Temp: 98.2 F (36.8 C) 98.2 F (36.8 C) 98.5 F (36.9 C) 98 F (36.7 C)  TempSrc: Oral Oral Oral Oral  Resp: _0 Height:      Weight:  99.6 kg (219 lb 9.3 oz)    SpO2: 94% 100% 100% 100%    Intake/Output Summary (Last 24 hours) at 04/10/15 1727 Last data filed at 04/10/15 1300  Gross per 24 hour  Intake    840 ml  Output      0 ml  Net    840 ml   Weight change: -2.7 kg (-5 lb 15.2 oz) Exam:   General:  Pt is alert, follows commands appropriately, not in acute distress  HEENT: No icterus, No thrush, No neck mass, [place]/AT  Cardiovascular: RRR, S1/S2, no rubs, no gallops  Respiratory: CTA bilaterally, no wheezing, no crackles, no rhonchi  Abdomen: Soft/+BS, non tender, non distended, no guarding  Extremities: No edema, No  lymphangitis, No petechiae, No rashes, no synovitis  Data Reviewed: Basic Metabolic Panel:  Recent Labs Lab 04/05/15 2049 04/06/15 0515 04/06/15 0930 04/07/15 0300  NA 134* 132*  --  133*  K 4.2 4.4  --  4.2  CL 92* 89*  --  89*  CO2 30 30  --  29  GLUCOSE 102* 99  --  85  BUN 23* 26*  --  39*  CREATININE 10.90* 11.71*  --  14.55*  CALCIUM 9.8 9.2  --  9.5  PHOS  --   --  7.0*  --    Liver Function Tests:  Recent Labs Lab 04/06/15 0515  AST 14*  ALT 9*  ALKPHOS 54  BILITOT 1.5*  PROT 6.8  ALBUMIN 2.5*   No results for input(s): LIPASE, AMYLASE in the last 168 hours. No results for input(s): AMMONIA in the last 168 hours. CBC:  Recent Labs Lab 04/05/15 2049 04/06/15 0515 04/07/15 0300  WBC 11.5* 11.0* 8.8  NEUTROABS  --  8.6*  --   HGB 8.4* 8.0* 7.9*  HCT 25.6* 23.9* 23.5*  MCV 91.8 91.6 92.5  PLT 287 313 213   Cardiac Enzymes: No results for input(s): CKTOTAL, CKMB, CKMBINDEX, TROPONINI in the last 168 hours. BNP: Invalid input(s): POCBNP CBG: No results for input(s): GLUCAP in the last 168 hours.  Recent Results (from the past 240 hour(s))  Blood culture (routine x 2)     Status: None   Collection Time: 04/05/15 10:26 PM  Result Value Ref Range Status   Specimen Description BLOOD RIGHT ARM  Final   Special Requests BOTTLES DRAWN AEROBIC AND ANAEROBIC 5CC  Final   Culture NO GROWTH 5 DAYS  Final   Report Status 04/10/2015 FINAL  Final  Culture, blood (single)     Status: None   Collection Time: 04/05/15 10:28 PM  Result Value Ref Range Status   Specimen Description BLOOD RIGHT FOREARM  Final   Special Requests BOTTLES DRAWN AEROBIC ONLY 10CC  Final   Culture NO GROWTH 5 DAYS  Final   Report Status 04/10/2015 FINAL  Final  Blood culture (routine x 2)     Status: None   Collection Time: 04/05/15 10:30 PM  Result Value Ref Range Status   Specimen Description BLOOD RIGHT WRIST  Final   Special Requests BOTTLES DRAWN AEROBIC AND ANAEROBIC 5CC   Final   Culture NO GROWTH 5 DAYS  Final   Report Status 04/10/2015 FINAL  Final  Culture, sputum-assessment     Status: None   Collection Time: 04/08/15  7:46 AM  Result Value Ref Range Status   Specimen Description SPUTUM  Final   Special Requests NONE  Final   Sputum evaluation   Final    MICROSCOPIC FINDINGS SUGGEST THAT THIS SPECIMEN IS NOT REPRESENTATIVE OF LOWER RESPIRATORY SECRETIONS. PLEASE RECOLLECT. Gram Stain Report Called to,Read Back By and Verified With: K. Johnney Ou AT 0825 ON [id number] BY Rhea Bleacher    Report Status 04/08/2015 FINAL  Final  AFB culture with smear     Status: None (Preliminary result)   Collection Time: 04/08/15  7:46 AM  Result Value Ref Range Status   Specimen Description SPUTUM  Final   Special Requests NONE  Final   Acid Fast Smear   Final    NO ACID FAST BACILLI SEEN Performed at [facility]    Culture   Final    CULTURE WILL BE EXAMINED FOR 6 WEEKS BEFORE ISSUING A FINAL REPORT Performed at [facility]    Report Status PENDING  Incomplete  AFB culture with smear     Status: None (Preliminary result)   Collection Time: 04/09/15 12:42 PM  Result Value Ref Range Status   Specimen Description SPUTUM  Final   Special Requests NONE  Final   Acid Fast Smear   Final    NO ACID FAST BACILLI SEEN Performed at [facility]    Culture   Final    CULTURE WILL BE EXAMINED FOR 6 WEEKS BEFORE ISSUING A FINAL REPORT Performed at [facility]    Report Status PENDING  Incomplete     Scheduled Meds: . amiodarone  200 mg Oral Daily  . calcitRIOL  2.25 mcg Oral Q T,Th,Sat-1800  . carvedilol  6.25 mg Oral BID WC  .  ceFAZolin (ANCEF) IV  2 g Intravenous Q T,Th,Sa-HD  . cinacalcet  90 mg Oral BID WC  . darbepoetin (ARANESP) injection - DIALYSIS  150 mcg Intravenous Q Thu-HD  . feeding supplement (NEPRO CARB STEADY)  237 mL Oral BID BM  . [START ON 04/11/2015] ferric gluconate (FERRLECIT/NULECIT) IV  125 mg Intravenous Q  T,Th,Sa-HD  . lisinopril  20 mg Oral Daily  . multivitamin  1 tablet Oral QHS  . sevelamer carbonate  2,400 mg Oral TID WC  . vancomycin  1,000 mg Intravenous Q T,Th,Sa-HD  . warfarin  7.5 mg Oral ONCE-1800  . Warfarin - Pharmacist Dosing Inpatient   Does not apply q1800   Continuous Infusions:    Delisa Finck, DO  [facility] Pager [phone]  If 7PM-7AM, please contact night-coverage [email or web address] Password [email or web address] 04/10/2015, 5:27 PM   LOS: 5 days

## 2015-04-11 DIAGNOSIS — N2889 Other specified disorders of kidney and ureter: Secondary | ICD-10-CM | POA: Insufficient documentation

## 2015-04-11 DIAGNOSIS — D689 Coagulation defect, unspecified: Secondary | ICD-10-CM

## 2015-04-11 LAB — BASIC METABOLIC PANEL
ANION GAP: 11 (ref 5–15)
BUN: 24 mg/dL — ABNORMAL HIGH (ref 6–20)
CALCIUM: 9.2 mg/dL (ref 8.9–10.3)
CO2: 29 mmol/L (ref 22–32)
Chloride: 99 mmol/L — ABNORMAL LOW (ref 101–111)
Creatinine, Ser: 12.85 mg/dL — ABNORMAL HIGH (ref 0.61–1.24)
GFR calc Af Amer: 5 mL/min — ABNORMAL LOW (ref >60)
GFR calc non Af Amer: 4 mL/min — ABNORMAL LOW (ref >60)
GLUCOSE: 88 mg/dL (ref 65–99)
POTASSIUM: 3.7 mmol/L (ref 3.5–5.1)
Sodium: 139 mmol/L (ref 135–145)

## 2015-04-11 LAB — CBC
HEMATOCRIT: 26 % — AB (ref 39.0–52.0)
HEMOGLOBIN: 8 g/dL — AB (ref 13.0–17.0)
MCH: 29 pg (ref 26.0–34.0)
MCHC: 30.8 g/dL (ref 30.0–36.0)
MCV: 94.2 fL (ref 78.0–100.0)
Platelets: 340 10*3/uL (ref 150–400)
RBC: 2.76 MIL/uL — ABNORMAL LOW (ref 4.22–5.81)
RDW: 17.8 % — ABNORMAL HIGH (ref 11.5–15.5)
WBC: 8 10*3/uL (ref 4.0–10.5)

## 2015-04-11 LAB — PROTIME-INR
INR: 2.1 — ABNORMAL HIGH (ref 0.00–1.49)
PROTHROMBIN TIME: 23.4 s — AB (ref 11.6–15.2)

## 2015-04-11 MED ORDER — PENTAFLUOROPROP-TETRAFLUOROETH EX AERO: 1.0000 "application " | INHALATION_SPRAY | CUTANEOUS | Status: DC | PRN

## 2015-04-11 MED ORDER — LIDOCAINE-PRILOCAINE 2.5-2.5 % EX CREA
1.0000 "application " | TOPICAL_CREAM | CUTANEOUS | Status: DC | PRN
  Filled 2015-04-11: qty 5

## 2015-04-11 MED ORDER — LIDOCAINE HCL (PF) 1 % IJ SOLN: 5.0000 mL | INTRAMUSCULAR | Status: DC | PRN

## 2015-04-11 MED ORDER — WARFARIN SODIUM 7.5 MG PO TABS
7.5000 mg | ORAL_TABLET | Freq: Once | ORAL | Status: AC
  Administered 2015-04-11: 7.5 mg via ORAL
  Filled 2015-04-11: qty 1

## 2015-04-11 MED ORDER — SODIUM CHLORIDE 0.9 % IV SOLN: 100.0000 mL | INTRAVENOUS | Status: DC | PRN

## 2015-04-11 MED ORDER — HEPARIN SODIUM (PORCINE) 1000 UNIT/ML DIALYSIS: 1000.0000 [IU] | INTRAMUSCULAR | Status: DC | PRN

## 2015-04-11 MED ORDER — ALTEPLASE 2 MG IJ SOLR
2.0000 mg | Freq: Once | INTRAMUSCULAR | Status: DC | PRN
  Filled 2015-04-11: qty 2

## 2015-04-11 NOTE — Progress Notes (Signed)
Subjective:   Feeling better, no sob. 'sweaty last night'   Objective Filed Vitals:   04/10/15 0500 04/10/15 0913 04/10/15 2053 04/11/15 0536  BP: 129/86 124/75 114/80 132/92  Pulse: 72 72 68 79  Temp: 98.5 F (36.9 C) 98 F (36.7 C) 98.3 F (36.8 C) 98.4 F (36.9 C)  TempSrc: Oral Oral Oral Oral  Resp: '18 18 17 18  ' Height:      Weight:   99.4 kg (219 lb 2.2 oz)   SpO2: 100% 100% 100% 93%   Physical Exam General: alert and oriented. No acute distress  Heart: RRR 2/6 murmur Lungs: CTA, unlabored Abdomen: soft, nontender +BS Extremities: no edema  Dialysis Access:  LAVF +b/t   Dialysis Orders: [facility], 4hr, BFR 500 with 14g needles, DW 103kg, 2K 2 Ca, No Na modeling, micera153mg q 2 weeks, calcitriol 2.249m TTS  Assessment/Plan: 1. Cavitary Lesion with multiple pulmonary nodules -afebrile, work up per primary/ID/ESR 126, CRP 24 empiric antibiotics; nodules not ammenable to bronch; on IV Vanc and Ancef - hx MSSA endocarditis last year - no positive cultures to date- if TEE and AFB neg--possible d/c abx and observe- for TEE today 2. ESRD -TTS - HD pending today - will have lower EDW at d/c  3. Anemia Fe deficiency- Hgb 8, stable- last Mircera 150 9/20 - given Aranesp 150 9/30 tsat 8% total Fe only 14 with ferritin 1337 - replete short course Fe- 8 4. Secondary hyperparathyroidism - uncontrolled iPTH 1470 when last checked - continue calcitriol, binders/sensipar- phos 7 5. HTN/volume - post HD standing weight 10/1 as 98.7; BP ok - BP much improved! Lower edw at DC 6. Nutrition - losing weight alb 2.5; EDW recently lowered 2 kg and now below new edw- add supplements 7. Renal mass -evaluation by Dr. WrJeffie Pollockuggest he has probably hemoorhage into one or more of his acquired renal cysts. Rec ReCT in 3-4 weeks 8. 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 Coagulopathy- INR 2.10 10. PAF-on amio and coumadin  BrShelle IronNP [id number]/10/2014,9:10 AM  LOS: 6 days   Pt seen, examined and agree w A/P as above. Awaiting results of AFB smears. HD today.  RoKelly SplinterD pager [phone]  cell [phone]/10/2014, 1:44 PM    Additional Objective Labs: Basic Metabolic Panel:  Recent Labs Lab 04/06/15 0515 04/06/15 0930 04/07/15 0300 04/11/15 0722  NA 132*  --  133* 139  K 4.4  --  4.2 3.7  CL 89*  --  89* 99*  CO2 30  --  29 29  GLUCOSE 99  --  85 88  BUN 26*  --  39* 24*  CREATININE 11.71*  --  14.55* 12.85*  CALCIUM 9.2  --  9.5 9.2  PHOS  --  7.0*  --   --    Liver Function Tests:  Recent Labs Lab 04/06/15 0515  AST 14*  ALT 9*  ALKPHOS 54  BILITOT 1.5*  PROT 6.8  ALBUMIN 2.5*   No results for input(s): LIPASE, AMYLASE in the last 168 hours. CBC:  Recent Labs Lab 04/05/15 2049 04/06/15 0515 04/07/15 0300 04/11/15 0722  WBC 11.5* 11.0* 8.8 8.0  NEUTROABS  --  8.6*  --   --   HGB 8.4* 8.0* 7.9* 8.0*  HCT 25.6* 23.9* 23.5* 26.0*  MCV 91.8 91.6 92.5 94.2  PLT 287 313 213 340   Blood Culture    Component Value Date/Time  SDES SPUTUM 04/09/2015 1242   SPECREQUEST NONE 04/09/2015 1242   CULT  04/09/2015 1242    CULTURE WILL BE EXAMINED FOR 6 WEEKS BEFORE ISSUING A FINAL REPORT Performed at [facility] PENDING 04/09/2015 1242    Cardiac Enzymes: No results for input(s): CKTOTAL, CKMB, CKMBINDEX, TROPONINI in the last 168 hours. CBG: No results for input(s): GLUCAP in the last 168 hours. Iron Studies: No results for input(s): IRON, TIBC, TRANSFERRIN, FERRITIN in the last 72 hours. '@[email or web address]' @ Studies/Results: No results found. Medications:   . amiodarone  200 mg Oral Daily  . calcitRIOL  2.25 mcg Oral Q T,Th,Sat-1800  . carvedilol  6.25 mg Oral BID WC  .  ceFAZolin (ANCEF) IV  2 g Intravenous Q T,Th,Sa-HD  . cinacalcet  90 mg Oral BID WC  . darbepoetin (ARANESP) injection - DIALYSIS  150 mcg Intravenous Q Thu-HD  . feeding supplement  (NEPRO CARB STEADY)  237 mL Oral BID BM  . ferric gluconate (FERRLECIT/NULECIT) IV  125 mg Intravenous Q T,Th,Sa-HD  . lisinopril  20 mg Oral Daily  . multivitamin  1 tablet Oral QHS  . sevelamer carbonate  2,400 mg Oral TID WC  . vancomycin  1,000 mg Intravenous Q T,Th,Sa-HD  . Warfarin - Pharmacist Dosing Inpatient   Does not apply [phone]

## 2015-04-11 NOTE — Progress Notes (Signed)
Patient evening PO medications moved to 2000 tentatively depending on when dialysis ends, medications need to be administered post dialysis.

## 2015-04-11 NOTE — Progress Notes (Addendum)
Spoke with hemodialysis RN Dain whom stated patient dialysis would be completed in patient room later today due to airborne precautions, RN Almyra Brace verified she would be administering all of his noon IVPB medications (Vancomycin, Cefazolin, and Ferric Gluconate IVPB) are to be administered after completion of dialysis.

## 2015-04-11 NOTE — Progress Notes (Signed)
PROGRESS NOTE  Timothy Palmer GEZ:[id number] DOB: 05/16/1966 DOA: 04/05/2015 PCP: Louis Meckel, MD  Brief history 49 year old male with history of ESRD, hypertension, MSSA bacteremia with mitral valve endocarditis, Atrial fibrillation, hypertension presented with 2 weeks of worsening cough with yellow-green sputum as well as subjective fevers and chills. The patient had an abnormal chest x-ray in outpatient setting. As result CT of the chest with intravenous contrast was obtained. 04/05/2015 CT of the chest revealed bilateral mediastinal and hilar lymphadenopathy as well as improving bilateral patchy ground glass opacities. There was a new left upper lobe 1.5 cm cavitary nodule. As result, the patient was advised to come to the hospital for further workup.  Notably, the patient had MSSA bacteremia back in May 2015 and August 2015. 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 He subsequently underwent a below the knee left popliteal bypass and embolectomy. The patient completed 6 weeks of cefazolin after his mitral valve replacement. Pt states he last smoked marijuana 2 weeks ago and has remote hx of tobacco for which he only smoked 2 years over 15 yrs ago. Assessment/Plan: Cavitary lung nodule -Patient has bilateral mediastinal and hilar lymphadenopathy -Given the patient's clinical history, concerning for septic emboli -Other considerations include atypical mycobacterial infection, malignancy, and fungal infection -Clinical suspicion for MTB is low--consider discontinuation respiratory isolation if okay with ID -Appreciate pulmonary--spoke with Dr. Eula Fried not amendable to bronchoscopy-->PET in out pt setting -Continue  respiratory isolation--sputum for AFB x 3--NEG -Quantiferon TB--negative -blood cultures--neg to date x 5 days -ID following -TEE--RESCHEDULED FOR 04/12/15 '@9AM'  (3rd AFB did not result in time) -if TEE and AFB neg--possible d/c abx and observe -check ACE--pending -ESR--126 ESRD  -Appreciate nephrology followup -Dialysis on Tuesday, Thursday, Saturdays  Status post mitral valve replacement  -Status post mitral valve replacement--04/05/2014 secondary to endocarditis with history of MSSA bacteremia  -Repeat echocardiogram (TTE)--no vegetation  Coagulopathy  -INR >10 -vitamin K x 1-->INR 2.1-->1.63 -likely due to acute infection -check fibrinogen, peripheral smear--not consistent with DIC -Restarted coumadin  -recheck INR in am Renal Mass -CT abd/pelvis with renal mass protocol--large left complex lesion with perinephric stranding--Hemorrhagic cyst -appreciate urology opinion--repeat CT in 2-3 weeks Paroxysmal atrial fibrillation  -This occurred postoperatively  -presently in sinus -Restarted warfarin -Continue amiodarone History of MSSA mitral valve endocarditis (native valve) -Finished 6 weeks of cefazolin after mitral valve replacement  HTN -continue coreg and lisinopril   Family Communication: Pt at beside Disposition Plan: Home when cleared by ID        Procedures/Studies: Ct Abdomen Pelvis W Wo Contrast  04/06/2015   CLINICAL DATA:  50 year old hemodialysis patient with left upper quadrant abdominal mass on chest CT, incompletely visualized. Chest CT performed for cough, weakness and anorexia.  EXAM: CT ABDOMEN AND PELVIS WITHOUT AND WITH CONTRAST  TECHNIQUE: Multidetector CT imaging of the abdomen and pelvis was performed following the standard protocol before and following the bolus administration of intravenous contrast.  CONTRAST:  145m OMNIPAQUE IOHEXOL 300 MG/ML  SOLN  COMPARISON:  Chest CT 04/05/2015.  Abdominal CTA 03/24/2014.  FINDINGS: Lower  chest: The visualized lung bases are stable from yesterday's chest CT. There is pleural thickening and calcified pleural plaque formation on the right. Prosthetic mitral valve  noted.  Hepatobiliary: The liver is normal in density without focal abnormality. There are multiple calcified gallstones. The gallbladder is contracted with mild wall thickening, but no surrounding inflammation. There is no gallbladder wall thickening or evidence of choledocholithiasis.  Pancreas: Unremarkable. No pancreatic ductal dilatation or surrounding inflammatory changes.  Spleen: Normal in size without focal abnormality.  Adrenals/Urinary Tract: Both adrenal glands appear normal. Both kidneys are enlarged by multiple cystic lesions consistent with adult polycystic kidney disease. On the right, a few these demonstrate thin peripheral calcifications. There is an area of ill-defined enhancement medially in the lower pole of the right kidney, measuring approximately 11 mm on image 89 of series 7. This enhances from 48 HU prior to contrast to 168 HU on the immediate postcontrast images. No other suspicious findings are present on the right. The left kidney demonstrates 2 complex lesions and diffuse perinephric soft tissue stranding. In the upper interpolar region, there is a 5.7 x 6.8 cm lesion on image 63 of series 2 which does not show any significant enhancement, measuring 39 HU prior to contrast, 39 HU on the immediate postcontrast images and 39 HU on the delayed images. In the lower pole, there is a large irregular lesion measuring 10.2 x 6.9 cm on image 84 of series 2. This does not show significant enhancement either, measuring 46 HU, 51 HU and 56 HU on the 3 phases. There is no definite enhancing renal lesion or hydronephrosis. There is no contrast excretion following contrast. The bladder demonstrates intermediate density within its lumen, but no apparent focal abnormality.  Stomach/Bowel: No evidence of bowel wall thickening,  distention or surrounding inflammatory change.  Vascular/Lymphatic: There are no enlarged abdominal or pelvic lymph nodes. Mild aortoiliac atherosclerosis appears unchanged.  Reproductive: There is mild enlargement of the prostate gland which demonstrates central dystrophic calcifications. There are prominent calcifications of the vas deferens.  Other: Small umbilical hernia containing fat.  Musculoskeletal: No acute or significant osseous findings. Degenerative disc disease noted at the lumbosacral junction.  IMPRESSION: 1. There are 2 large complex lesions involving the interpolar and lower pole regions of the left kidney with associated perinephric soft tissue stranding. Neither lesion demonstrates significant enhancement. Findings are most consistent with hemorrhagic or infected cysts. Neoplasm is unlikely given the lack of significant enhancement. 2. There is a small enhancing nodule inferomedially in the right kidney which is not well-defined, although could reflect a small tumor or vascular lesion. 3. No evidence of obstructing calculus. 4. Cholelithiasis. 5. If there is clinical concern of an infected renal cyst, cyst aspiration under ultrasound could be attempted. Otherwise, management options include CT or MR follow-up of the bilateral lesions.   Electronically Signed   By: Richardean Sale M.D.   On: 04/06/2015 21:57   Dg Chest 2 View  03/19/2015   CLINICAL DATA:  Chest pain  EXAM: CHEST  2 VIEW  COMPARISON:  05/30/2014  FINDINGS: Stable mild cardiomegaly and vascular pedicle widening in this patient post mitral valve repair.  Pleural and parenchymal scarring on the right with volume loss which is presumably postoperative.  Subtle small density in the peripheral left upper chest near the posterior fifth rib is likely summation shadows with the scapula. There is no edema, consolidation, effusion, or pneumothorax.  No acute osseous finding to explain chest pain.  IMPRESSION: 1. No active cardiopulmonary  disease. 2. Stable pleural parenchymal scarring on the right.   Electronically Signed   By: Monte Fantasia M.D.   On: 03/19/2015 17:58  Ct Chest W Contrast  04/05/2015   CLINICAL DATA:  Cough. Weakness. Anorexia for 2.5 weeks. Prior mitral valve replacement.  EXAM: CT CHEST WITH CONTRAST  TECHNIQUE: Multidetector CT imaging of the chest was performed during intravenous contrast administration.  CONTRAST:  51m ISOVUE-300 IOPAMIDOL (ISOVUE-300) INJECTION 61%  COMPARISON:  03/22/2014 chest CT angiogram. 03/19/2015 chest radiograph.  FINDINGS: Mediastinum/Nodes: Mitral valve prosthesis appears well positioned. Mild cardiomegaly, decreased. No pericardial fluid/thickening. There is atherosclerosis of the thoracic aorta, the great vessels of the mediastinum and the coronary arteries, including calcified atherosclerotic plaque in the left anterior descending, left circumflex and right coronary arteries. Great vessels are normal in course and caliber. No central pulmonary emboli. There is a partially calcified 1.5 cm hypodense posterior left thyroid lobe nodule, not appreciably changed. Normal esophagus. No axillary lymphadenopathy. Stable mildly enlarged 1.4 cm upper right paratracheal node (series 3/image 11). Stable mildly enlarged 1.5 cm subcarinal node (3/32). Stable aortopulmonary window lymphadenopathy, with the largest node measuring 1.9 cm (3/22), stable. There are mildly enlarged hilar nodes bilaterally, including a 1.5 cm right hilar node (3/20) and a 1.1 cm left hilar node (3/29), stable.  Lungs/Pleura: No pneumothorax. There are calcified pleural plaques in the mid and basilar right pleural space with trace right pleural effusion/pleural thickening, decreased. One of the right hemidiaphragmatic calcified pleural plaques is new (series 4/image 39) . No left pleural effusion. There is patchy ground-glass opacity throughout both lungs, slightly less prominent in the interval. There is a 1.5 cm new cavitary  nodule in the peripheral left upper lobe (series 4/image 18). There is a new subsolid 1.6 x 1.4 cm medial right upper lobe pulmonary nodule (4/22). There are several additional sub cm sub solid new pulmonary nodules throughout both lungs (at least 10).  Upper abdomen: Multiple subcentimeter calcified gallstones are seen layering in the nondistended non thick walled gallbladder. No biliary ductal dilatation. Partially visualized is a polycystic left kidney. There is an indeterminate incompletely characterized and partially visualized exophytic hypodense mass in the lateral upper left kidney measuring at least 5.1 x 5.0 cm (series 3/image 66), which was not apparent on the 03/24/2014 CT abdomen study.  Musculoskeletal: No aggressive appearing focal osseous lesions. There is bilateral gynecomastia, asymmetric to the right, increased in the interval. Mild degenerative changes in the thoracic spine.  IMPRESSION: 1. Several (at least 10) new subsolid pulmonary nodules scattered throughout both lungs, largest 1.6 cm in the right upper lobe, including a cavitary 1.5 cm left upper lobe pulmonary nodule. Differential considerations include septic pulmonary emboli, fungal pneumonia and less likely pulmonary metastases or tuberculosis. Consider short-term follow-up post treatment chest CT in 4-6 weeks. 2. Mild cardiomegaly with patchy ground-glass opacity throughout both lungs, both which are decreased compared to the 03/22/2014 chest CT study, favor improved mild pulmonary edema. 3. Progressive calcified pleural plaques in the right pleural space. Trace right pleural effusion/thickening, decreased. 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 4. Stable nonspecific mediastinal and bilateral hilar lymphadenopathy. 5. Incompletely visualized indeterminate hypodense mass in the left upper quadrant of the abdomen, which appears to arise from the lateral upper left kidney, measures at least 5.1 cm in size and  appears new since the 03/24/2014 CT abdomen study. Recommend further evaluation with renal mass protocol CT or MRI of the abdomen with and without intravenous contrast. 6. Atherosclerosis, including three-vessel coronary artery disease. Please note that although the presence of coronary artery calcium documents the presence of coronary artery disease, the severity of this  disease and any potential stenosis cannot be assessed on this non-gated CT examination. Assessment for potential risk factor modification, dietary therapy or pharmacologic therapy may be warranted, if clinically indicated. 7. Cholelithiasis. These results were called by telephone at the time of interpretation on 04/05/2015 at 5:42 pm to Dr. Posey Pronto, who verbally acknowledged these results.   Electronically Signed   By: Ilona Sorrel M.D.   On: 04/05/2015 17:42         Subjective: Patient still complains of a cough but states that it has improved. Denies any hemoptysis, fevers, chills, chest pain, shortness breath, nausea, vomiting, diarrhea, abdominal pain. No dysuria or hematuria. No rashes.  Objective: Filed Vitals:   04/10/15 0913 04/10/15 2053 04/11/15 0536 04/11/15 1000  BP: 124/75 114/80 132/92 109/68  Pulse: 72 68 79 66  Temp: 98 F (36.7 C) 98.3 F (36.8 C) 98.4 F (36.9 C) 98.9 F (37.2 C)  TempSrc: Oral Oral Oral Oral  Resp: '18 17 18 18  ' Height:      Weight:  99.4 kg (219 lb 2.2 oz)    SpO2: 100% 100% 93% 98%    Intake/Output Summary (Last 24 hours) at 04/11/15 1553 Last data filed at 04/11/15 0603  Gross per 24 hour  Intake    530 ml  Output      0 ml  Net    530 ml   Weight change: -0.2 kg (-7.1 oz) Exam:   General:  Pt is alert, follows commands appropriately, not in acute distress  HEENT: No icterus, No thrush, No neck mass, [place]/AT  Cardiovascular: RRR, S1/S2, no rubs, no gallops  Respiratory: CTA bilaterally, no wheezing, no crackles, no rhonchi  Abdomen: Soft/+BS, non tender, non distended,  no guarding  Extremities: No edema, No lymphangitis, No petechiae, No rashes, no synovitis  Data Reviewed: Basic Metabolic Panel:  Recent Labs Lab 04/05/15 2049 04/06/15 0515 04/06/15 0930 04/07/15 0300 04/11/15 0722  NA 134* 132*  --  133* 139  K 4.2 4.4  --  4.2 3.7  CL 92* 89*  --  89* 99*  CO2 30 30  --  29 29  GLUCOSE 102* 99  --  85 88  BUN 23* 26*  --  39* 24*  CREATININE 10.90* 11.71*  --  14.55* 12.85*  CALCIUM 9.8 9.2  --  9.5 9.2  PHOS  --   --  7.0*  --   --    Liver Function Tests:  Recent Labs Lab 04/06/15 0515  AST 14*  ALT 9*  ALKPHOS 54  BILITOT 1.5*  PROT 6.8  ALBUMIN 2.5*   No results for input(s): LIPASE, AMYLASE in the last 168 hours. No results for input(s): AMMONIA in the last 168 hours. CBC:  Recent Labs Lab 04/05/15 2049 04/06/15 0515 04/07/15 0300 04/11/15 0722  WBC 11.5* 11.0* 8.8 8.0  NEUTROABS  --  8.6*  --   --   HGB 8.4* 8.0* 7.9* 8.0*  HCT 25.6* 23.9* 23.5* 26.0*  MCV 91.8 91.6 92.5 94.2  PLT 287 313 213 340   Cardiac Enzymes: No results for input(s): CKTOTAL, CKMB, CKMBINDEX, TROPONINI in the last 168 hours. BNP: Invalid input(s): POCBNP CBG: No results for input(s): GLUCAP in the last 168 hours.  Recent Results (from the past 240 hour(s))  Blood culture (routine x 2)     Status: None   Collection Time: 04/05/15 10:26 PM  Result Value Ref Range Status   Specimen Description BLOOD RIGHT ARM  Final  Special Requests BOTTLES DRAWN AEROBIC AND ANAEROBIC 5CC  Final   Culture NO GROWTH 5 DAYS  Final   Report Status 04/10/2015 FINAL  Final  Culture, blood (single)     Status: None   Collection Time: 04/05/15 10:28 PM  Result Value Ref Range Status   Specimen Description BLOOD RIGHT FOREARM  Final   Special Requests BOTTLES DRAWN AEROBIC ONLY 10CC  Final   Culture NO GROWTH 5 DAYS  Final   Report Status 04/10/2015 FINAL  Final  Blood culture (routine x 2)     Status: None   Collection Time: 04/05/15 10:30 PM    Result Value Ref Range Status   Specimen Description BLOOD RIGHT WRIST  Final   Special Requests BOTTLES DRAWN AEROBIC AND ANAEROBIC 5CC  Final   Culture NO GROWTH 5 DAYS  Final   Report Status 04/10/2015 FINAL  Final  Culture, sputum-assessment     Status: None   Collection Time: 04/08/15  7:46 AM  Result Value Ref Range Status   Specimen Description SPUTUM  Final   Special Requests NONE  Final   Sputum evaluation   Final    MICROSCOPIC FINDINGS SUGGEST THAT THIS SPECIMEN IS NOT REPRESENTATIVE OF LOWER RESPIRATORY SECRETIONS. PLEASE RECOLLECT. Gram Stain Report Called to,Read Back By and Verified With: K. Johnney Ou AT 0825 ON [id number] BY Rhea Bleacher    Report Status 04/08/2015 FINAL  Final  AFB culture with smear     Status: None (Preliminary result)   Collection Time: 04/08/15  7:46 AM  Result Value Ref Range Status   Specimen Description SPUTUM  Final   Special Requests NONE  Final   Acid Fast Smear   Final    NO ACID FAST BACILLI SEEN Performed at [facility]    Culture   Final    CULTURE WILL BE EXAMINED FOR 6 WEEKS BEFORE ISSUING A FINAL REPORT Performed at [facility]    Report Status PENDING  Incomplete  AFB culture with smear     Status: None (Preliminary result)   Collection Time: 04/09/15 12:42 PM  Result Value Ref Range Status   Specimen Description SPUTUM  Final   Special Requests NONE  Final   Acid Fast Smear   Final    NO ACID FAST BACILLI SEEN Performed at [facility]    Culture   Final    CULTURE WILL BE EXAMINED FOR 6 WEEKS BEFORE ISSUING A FINAL REPORT Performed at [facility]    Report Status PENDING  Incomplete  AFB culture with smear     Status: None (Preliminary result)   Collection Time: 04/10/15 10:15 AM  Result Value Ref Range Status   Specimen Description SPUTUM  Final   Special Requests NONE  Final   Acid Fast Smear   Final    NO ACID FAST BACILLI SEEN Performed at [facility]    Culture    Final    CULTURE WILL BE EXAMINED FOR 6 WEEKS BEFORE ISSUING A FINAL REPORT Performed at [facility]    Report Status PENDING  Incomplete     Scheduled Meds: . amiodarone  200 mg Oral Daily  . calcitRIOL  2.25 mcg Oral Q T,Th,Sat-1800  . carvedilol  6.25 mg Oral BID WC  .  ceFAZolin (ANCEF) IV  2 g Intravenous Q T,Th,Sa-HD  . cinacalcet  90 mg Oral BID WC  . darbepoetin (ARANESP) injection - DIALYSIS  150 mcg Intravenous Q Thu-HD  . feeding  supplement (NEPRO CARB STEADY)  237 mL Oral BID BM  . ferric gluconate (FERRLECIT/NULECIT) IV  125 mg Intravenous Q T,Th,Sa-HD  . lisinopril  20 mg Oral Daily  . multivitamin  1 tablet Oral QHS  . sevelamer carbonate  2,400 mg Oral TID WC  . vancomycin  1,000 mg Intravenous Q T,Th,Sa-HD  . warfarin  7.5 mg Oral ONCE-1800  . Warfarin - Pharmacist Dosing Inpatient   Does not apply q1800   Continuous Infusions:    Encarnacion Bole, DO  [facility] Pager [phone]  If 7PM-7AM, please contact night-coverage [email or web address] Password [email or web address] 04/11/2015, 3:53 PM   LOS: 6 days

## 2015-04-11 NOTE — Progress Notes (Signed)
MEDICATION RELATED CONSULT NOTE - FOLLOW UP   Pharmacy Consult for Coumadin and vancomycin Indication: atrial fibrillation and r/o PNA (cavitary lung lesion, hx MSSA endocarditis)  No Known Allergies  Patient Measurements: Height: 5\' 11"  (180.3 cm) Weight: 219 lb 2.2 oz (99.4 kg) IBW/kg (Calculated) : 75.3  Vital Signs: Temp: 98.4 F (36.9 C) (10/04 0536) Temp Source: Oral (10/04 0536) BP: 132/92 mmHg (10/04 0536) Pulse Rate: 79 (10/04 0536) Intake/Output from previous day: 10/03 0701 - 10/04 0700 In: 1010 [P.O.:1010] Out: 0  Intake/Output from this shift:    Labs:  Recent Labs  04/11/15 0722  WBC 8.0  HGB 8.0*  HCT 26.0*  PLT 340  CREATININE 12.85*  INR                     2.1  Estimated Creatinine Clearance: 8.5 mL/min (by C-G formula based on Cr of 12.85).   Microbiology: Recent Results (from the past 720 hour(s))  Blood culture (routine x 2)     Status: None   Collection Time: 04/05/15 10:26 PM  Result Value Ref Range Status   Specimen Description BLOOD RIGHT ARM  Final   Special Requests BOTTLES DRAWN AEROBIC AND ANAEROBIC 5CC  Final   Culture NO GROWTH 5 DAYS  Final   Report Status 04/10/2015 FINAL  Final  Culture, blood (single)     Status: None   Collection Time: 04/05/15 10:28 PM  Result Value Ref Range Status   Specimen Description BLOOD RIGHT FOREARM  Final   Special Requests BOTTLES DRAWN AEROBIC ONLY 10CC  Final   Culture NO GROWTH 5 DAYS  Final   Report Status 04/10/2015 FINAL  Final  Blood culture (routine x 2)     Status: None   Collection Time: 04/05/15 10:30 PM  Result Value Ref Range Status   Specimen Description BLOOD RIGHT WRIST  Final   Special Requests BOTTLES DRAWN AEROBIC AND ANAEROBIC 5CC  Final   Culture NO GROWTH 5 DAYS  Final   Report Status 04/10/2015 FINAL  Final  Culture, sputum-assessment     Status: None   Collection Time: 04/08/15  7:46 AM  Result Value Ref Range Status   Specimen Description SPUTUM  Final   Special  Requests NONE  Final   Sputum evaluation   Final    MICROSCOPIC FINDINGS SUGGEST THAT THIS SPECIMEN IS NOT REPRESENTATIVE OF LOWER RESPIRATORY SECRETIONS. PLEASE RECOLLECT. Gram Stain Report Called to,Read Back By and Verified With: K. Johnney Ou AT 0825 ON [id number] BY Rhea Bleacher    Report Status 04/08/2015 FINAL  Final  AFB culture with smear     Status: None (Preliminary result)   Collection Time: 04/08/15  7:46 AM  Result Value Ref Range Status   Specimen Description SPUTUM  Final   Special Requests NONE  Final   Acid Fast Smear   Final    NO ACID FAST BACILLI SEEN Performed at [facility]    Culture   Final    CULTURE WILL BE EXAMINED FOR 6 WEEKS BEFORE ISSUING A FINAL REPORT Performed at [facility]    Report Status PENDING  Incomplete  AFB culture with smear     Status: None (Preliminary result)   Collection Time: 04/09/15 12:42 PM  Result Value Ref Range Status   Specimen Description SPUTUM  Final   Special Requests NONE  Final   Acid Fast Smear   Final    NO ACID FAST BACILLI SEEN Performed at [facility]    Culture   Final    CULTURE WILL BE EXAMINED FOR 6 WEEKS BEFORE ISSUING A FINAL REPORT Performed at [facility]    Report Status PENDING  Incomplete    Assessment: 48yoM admitted with SOB, fever/Chills, patchy ground glass opacities/pulmonary lung nodule and concern for new onset infection with septic emboli (hx MSSA endocarditis). He continues on vancomycin and Ancef with HD. He is afebrile and WBC are normal. Culture data remains neg thus far.  Vancomcyin 9/28 >> Ceftazidine 9/28 >> 9/29 Ancef 9/30 >>  Patient also on Coumadin PTA for hx afib. INR reported as >10 on 9/28 and pt received FFP and 5mg  PO Vit K. INR is therapeutic at 2.1. No bleeding noted, CBC is stable.  Home dose of warfarin is 7.5 mg on Sunday and 10 mg all other days.  Goal of Therapy:  Pre-HD Vancomycin trough level 15-25 mcg/ml INR goal 2-3  Plan:   Continue vancomycin 1000 mg IV after HD TTS PreHD vancomycin level as clinically indicated Coumadin 7.5 mg PO tonight Daily INR  [facility], Pharm.D., BCPS Clinical Pharmacist Pager: [phone] 04/11/2015 11:03 AM

## 2015-04-11 NOTE — Progress Notes (Signed)
Name: ALIZE VEECH MRN: [id number] DOB: [birth year]    ADMISSION DATE:  04/05/2015 CONSULTATION DATE:  9/29  REFERRING MD :  [facility]   CHIEF COMPLAINT:  Cavitary lung nodule   BRIEF PATIENT DESCRIPTION: 49yo with hx ESRD, HTN, chronic dCHF with hx mitral valve endocarditis with valve replacement 2015, Afib on coumadin.  Presented 9/28 with 2 weeks hx cough, SOB, night sweats, fever.  Abnormal CXR prompted CT chest which revealed cavitary lung lesion and PCCM consulted  SIGNIFICANT EVENTS  9/28 - Admitted to hospital  STUDIES:  CT CHEST W/ 9/28 - Several (at least 10) new subsolid pulmonary nodules scattered throughout both lungs, largest 1.6 cm in the right upper lobe, including a cavitary 1.5 cm left upper lobe pulmonary nodule.  Progressive calcified pleural plaques in the right pleural space.  Incompletely visualized indeterminate hypodense mass in the left upper quadrant of the abdomen, which appears to arise from the lateral upper left kidney, measures at least 5.1 cm in size and appears new since the 03/24/2014 CT abdomen study.  CT ABDOMEN/PELVIS W/ & W/O 9/29 - 2 complex lesions left kidney without enhancement most consistent with hemorrhagic or infected cysts. Neoplasm unlikely. Small enhancing nodule right kidney.  SUBJECTIVE: Cough is now nonproductive. Denies any dyspnea at rest. Denies any chest pain or pressure.  REVIEW OF SYSTEMS: Patient again had sweats overnight. Denies any chills or fever that he has felt. No nausea, vomiting, or abdominal pain.  VITAL SIGNS: Temp:  [98 F (36.7 C)-98.4 F (36.9 C)] 98.4 F (36.9 C) (10/04 0536) Pulse Rate:  [68-79] 79 (10/04 0536) Resp:  [17-18] 18 (10/04 0536) BP: (114-132)/(75-92) 132/92 mmHg (10/04 0536) SpO2:  [93 %-100 %] 93 % (10/04 0536) Weight:  [219 lb 2.2 oz (99.4 kg)] 219 lb 2.2 oz (99.4 kg) (10/03 2053)  PHYSICAL EXAMINATION: General:  Patient laying in bed with lights off. No distress. Awake watching  TV. Integument: Warm and dry. No rash on exposed skin.  HEENT:  Moist mucus membranes. Poor dentition. No scleral injection or icterus.  Cardiovascular:  Regular rate. No edema. No appreciable JVD.  Pulmonary: Clear to auscultation bilaterally. No accessory muscle use. Abdomen: Soft. Normal bowel sounds. Mildly protuberant. Grossly nontender. Musculoskeletal:  Normal bulk and tone. Hand grip strength 5/5 bilaterally. No joint deformity or effusion appreciated.   Recent Labs Lab 04/05/15 2049 04/06/15 0515 04/07/15 0300  NA 134* 132* 133*  K 4.2 4.4 4.2  CL 92* 89* 89*  CO2 30 30 29   BUN 23* 26* 39*  CREATININE 10.90* 11.71* 14.55*  GLUCOSE 102* 99 85    Recent Labs Lab 04/06/15 0515 04/07/15 0300 04/11/15 0722  HGB 8.0* 7.9* 8.0*  HCT 23.9* 23.5* 26.0*  WBC 11.0* 8.8 8.0  PLT 313 213 340   No results found.  MICROBIOLOGY SPUTUM AFB 10/3>>> SPUTUM AFB 10/2>>>smear neg/pending SPUTUM AFB 10/1>>>smear neg/pending  QUANTIFERON-TB 9/29 - negative HIV - negative  BLOOD CTX 9/28>>>  ANTIBIOTICS Cefazolin 9/29 >>> Vancomycin 9/29 >>>  ASSESSMENT / PLAN: 49 year old male with history of bacteremia presenting with cavitary lung lesion & mediastinal adenopathy. Adenopathy has been present for some time going back to September 2015. This is of unclear significance. Reviewing the radiology reports from the CT scan of the abdomen and pelvis the patient has a small enhancing lesion within his right kidney by what appears to be hemorrhagic or infected cysts within the left kidney. Waiting third AFB. Plan for transesophageal echocardiogram. Continuing to follow.  1. Cavitary  lung lesion: Awaiting third sputum AFB. Reviewing primary team's notes they plan for transesophageal echocardiogram to evaluate for possible endocarditis.  2. 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   Sonia Baller Ashok Cordia,  M.D. [facility] Pulmonary & Critical Care Pager:  [phone] After 3pm or if no response, call [phone] Pager # [phone] OR # [phone] if no answer

## 2015-04-12 ENCOUNTER — Encounter (HOSPITAL_COMMUNITY): Payer: Self-pay

## 2015-04-12 ENCOUNTER — Encounter (HOSPITAL_COMMUNITY)
Admission: EM | Disposition: A | Discharge status: Home / Self Care | Payer: Self-pay | Source: Home / Self Care | Attending: Internal Medicine

## 2015-04-12 ENCOUNTER — Encounter (HOSPITAL_COMMUNITY): Payer: Medicare Other

## 2015-04-12 ENCOUNTER — Inpatient Hospital Stay (HOSPITAL_COMMUNITY): Payer: Medicare Other

## 2015-04-12 DIAGNOSIS — N189 Chronic kidney disease, unspecified: Secondary | ICD-10-CM

## 2015-04-12 DIAGNOSIS — I38 Endocarditis, valve unspecified: Secondary | ICD-10-CM

## 2015-04-12 DIAGNOSIS — T826XXA Infection and inflammatory reaction due to cardiac valve prosthesis, initial encounter: Secondary | ICD-10-CM

## 2015-04-12 DIAGNOSIS — I330 Acute and subacute infective endocarditis: Secondary | ICD-10-CM

## 2015-04-12 DIAGNOSIS — I350 Nonrheumatic aortic (valve) stenosis: Secondary | ICD-10-CM

## 2015-04-12 DIAGNOSIS — D631 Anemia in chronic kidney disease: Secondary | ICD-10-CM

## 2015-04-12 HISTORY — DX: Endocarditis, valve unspecified: I38

## 2015-04-12 HISTORY — DX: Infection and inflammatory reaction due to cardiac valve prosthesis, initial encounter: T82.6XXA

## 2015-04-12 HISTORY — PX: TEE WITHOUT CARDIOVERSION: SHX5443

## 2015-04-12 HISTORY — DX: Acute and subacute infective endocarditis: I33.0

## 2015-04-12 LAB — PROTIME-INR
INR: 2.16 — AB (ref 0.00–1.49)
Prothrombin Time: 23.9 seconds — ABNORMAL HIGH (ref 11.6–15.2)

## 2015-04-12 LAB — ANGIOTENSIN CONVERTING ENZYME

## 2015-04-12 SURGERY — ECHOCARDIOGRAM, TRANSESOPHAGEAL
Anesthesia: Moderate Sedation

## 2015-04-12 MED ORDER — DIPHENHYDRAMINE HCL 50 MG/ML IJ SOLN
INTRAMUSCULAR | Status: AC
  Filled 2015-04-12: qty 1

## 2015-04-12 MED ORDER — SODIUM CHLORIDE 0.9 % IV SOLN: INTRAVENOUS | Status: DC

## 2015-04-12 MED ORDER — FENTANYL CITRATE (PF) 100 MCG/2ML IJ SOLN
INTRAMUSCULAR | Status: AC
  Filled 2015-04-12: qty 2

## 2015-04-12 MED ORDER — FENTANYL CITRATE (PF) 100 MCG/2ML IJ SOLN
INTRAMUSCULAR | Status: DC | PRN
  Administered 2015-04-12 (×2): 25 ug via INTRAVENOUS

## 2015-04-12 MED ORDER — WARFARIN SODIUM 7.5 MG PO TABS
7.5000 mg | ORAL_TABLET | Freq: Every day | ORAL | Status: DC
  Administered 2015-04-12 – 2015-04-13 (×2): 7.5 mg via ORAL
  Filled 2015-04-12 (×2): qty 1

## 2015-04-12 MED ORDER — MIDAZOLAM HCL 10 MG/2ML IJ SOLN
INTRAMUSCULAR | Status: DC | PRN
  Administered 2015-04-12: 1 mg via INTRAVENOUS
  Administered 2015-04-12 (×2): 2 mg via INTRAVENOUS

## 2015-04-12 MED ORDER — DIPHENHYDRAMINE HCL 50 MG/ML IJ SOLN
INTRAMUSCULAR | Status: DC | PRN
  Administered 2015-04-12: 25 mg via INTRAVENOUS

## 2015-04-12 MED ORDER — MIDAZOLAM HCL 5 MG/ML IJ SOLN
INTRAMUSCULAR | Status: AC
  Filled 2015-04-12: qty 2

## 2015-04-12 MED ORDER — BUTAMBEN-TETRACAINE-BENZOCAINE 2-2-14 % EX AERO
INHALATION_SPRAY | CUTANEOUS | Status: DC | PRN
  Administered 2015-04-12: 2 via TOPICAL
  Administered 2015-04-12: 1 via TOPICAL

## 2015-04-12 NOTE — Progress Notes (Signed)
  Echocardiogram Echocardiogram Transesophageal has been performed.  Roseanna Rainbow R 04/12/2015, 10:12 AM

## 2015-04-12 NOTE — Progress Notes (Signed)
Attempted to see the patient this morning. However, he was down undergoing his transesophageal echocardiogram. Will return to review the results and see the patient again on 10/6. Please contact me before then if there are any specific questions.  Sonia Baller Ashok Cordia, M.D. [place] [facility] Pager:  [phone] After 3pm or if no response, call [phone]

## 2015-04-12 NOTE — CV Procedure (Addendum)
Procedure: TEE  Indication: Endocarditis  Sedation: Versed 4 mg, Fentanyl 50 mcg  Findings: Please see echo section for full report.  Normal left ventricular size with mild LV hypertrophy.  EF 50% with septal bounce.  Normal RV size and systolic function.  Moderate LAE with no LAA thrombus.  Trivial TR, no vegetation.  Mild aortic stenosis, no aortic insufficiency.  No aortic valve vegetation noted though the valve was mild to moderately calcified.  The mitral valve was status post repair.  There was trivial MR.  Mean gradient 6 mmHg but pressure half-time around 70 msec so doubt significant stenosis.  There was a small mobile vegetation on the anterior leaflet of the mitral valve.  No evidence for PFO or ASD.  Normal caliber aorta with mild plaque.   Mitral valve vegetation noted => small, mobile.  Does not appear to be compromising valve currently.  Trivial MR.   Loralie Champagne 04/12/2015 10:09 AM

## 2015-04-12 NOTE — Interval H&P Note (Signed)
History and Physical Interval Note:  04/12/2015 9:30 AM  Timothy Palmer  has presented today for surgery, with the diagnosis of bacterium  The various methods of treatment have been discussed with the patient and family. After consideration of risks, benefits and other options for treatment, the patient has consented to  Procedure(s): TRANSESOPHAGEAL ECHOCARDIOGRAM (TEE) (N/A) as a surgical intervention .  The patient's history has been reviewed, patient examined, no change in status, stable for surgery.  I have reviewed the patient's chart and labs.  Questions were answered to the patient's satisfaction.     [name] [employer]

## 2015-04-12 NOTE — Progress Notes (Signed)
PROGRESS NOTE    Timothy Palmer [id number] DOB: 22-Sep-1965 DOA: 04/05/2015 PCP: Louis Meckel, MD  HPI/Brief narrative 49 year old male with history of ESRD, hypertension, MSSA bacteremia with mitral valve endocarditis, Atrial fibrillation, hypertension presented with 2 weeks of worsening cough with yellow-green sputum as well as subjective fevers and chills. The patient had an abnormal chest x-ray in outpatient setting. As result CT of the chest with intravenous contrast was obtained. 04/05/2015 CT of the chest revealed bilateral mediastinal and hilar lymphadenopathy as well as improving bilateral patchy ground glass opacities. There was a new left upper lobe 1.5 cm cavitary nodule. As result, the patient was advised to come to the hospital for further workup.  Notably, the patient had MSSA bacteremia back in May 2015 and August 2015. The patient was admitted in September 2015 during which he underwent mitral valve repair on 04/05/2014 secondary to mitral valve endocarditis from MSSA bacteremia. 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 He subsequently underwent a below the knee left popliteal bypass and embolectomy. The patient completed 6 weeks of cefazolin after his mitral valve replacement. Pt states he last smoked marijuana 2 weeks ago and has remote hx of tobacco for which he only smoked 2 years over 15 yrs ago.   Assessment/Plan:  Cavitary lung nodule -Patient has bilateral mediastinal and hilar lymphadenopathy -Given the patient's clinical history, concern was for septic emboli -Other considerations include atypical mycobacterial infection, malignancy, and fungal infection -Clinical suspicion for MTB is low--consider discontinuation respiratory isolation if okay with ID -Appreciate pulmonary-- Dr. Carles Collet spoke with Dr. Eula Fried not amendable to bronchoscopy-->PET in out pt  setting -Continue respiratory isolation--sputum for AFB x 3--NEG -Quantiferon TB--negative -blood cultures--neg x 5 days -ID following -TEE-  A small mobile vegetation noted on anterior leaflet of the mitral valve which does not appear to be compromising valve currently -  Will have ID reassess regarding need for further antibiotics and airborne isolation. -check ACE--pending -ESR--126  ESRD  -Appreciate nephrology followup -Dialysis on Tuesday, Thursday, Saturdays   Status post mitral valve  repair -Status post mitral valve repair--04/05/2014 secondary to endocarditis with history of MSSA bacteremia  -Repeat echocardiogram (TTE)--no vegetation  -  TEE report as above.  Coagulopathy  -INR >10 -vitamin K x 1-->INR 2.1-->1.63 -likely due to acute infection -check fibrinogen, peripheral smear--not consistent with DIC -Restarted coumadin  -  INR therapeutic in the 2 range since 04/11/15  Renal Mass -CT abd/pelvis with renal mass protocol--large left complex lesion with perinephric stranding--Hemorrhagic cyst -appreciate urology opinion--repeat CT in 2-3 weeks  Paroxysmal atrial fibrillation  -This occurred postoperatively  -presently in sinus -Restarted warfarin -Continue amiodarone and carvedilol  History of MSSA mitral valve endocarditis (native valve) -Finished 6 weeks of cefazolin after mitral valve replacement   HTN -continue coreg and lisinopril. Controlled  Anemia in chronic kidney disease -  stable   DVT prophylaxis:  Anticoagulated on Coumadin Code Status:  full Family Communication:  None at bedside Disposition Plan:  DC home when medically stable   Consultants:   Nephrology   Cardiology   Infectious disease  Procedures:   Hemodialysis   TEE 10/5  Antibiotics:   IV ceftazidime 1 dose  9/28   IV Zosyn 1 dose 9/28   IV vancomycin 9/28 >   IV cefazolin 9/30 >  Subjective:  seen post TEE. Felt groggy but denied any complaints.  As per nursing, no acute  events.  Objective: Filed Vitals:   04/12/15 1010 04/12/15 1020 04/12/15 1030 04/12/15 1033  BP: 108/61 100/65 111/83 121/81  Pulse: 70 67 67 66  Temp:      TempSrc:      Resp: '21 23 24 24  ' Height:      Weight:      SpO2: 97% 100% 98% 99%    Intake/Output Summary (Last 24 hours) at 04/12/15 1459 Last data filed at 04/12/15 0600  Gross per 24 hour  Intake    480 ml  Output   2500 ml  Net  -2020 ml   Filed Weights   04/10/15 2053 04/11/15 1600 04/11/15 2012  Weight: 99.4 kg (219 lb 2.2 oz) 99.2 kg (218 lb 11.1 oz) 96.6 kg (212 lb 15.4 oz)     Exam:  General exam:  Pleasant young male lying comfortably supine in bed Respiratory system: Clear. No increased work of breathing. Cardiovascular system: S1 & S2 heard, RRR. No JVD, murmurs, gallops, clicks or pedal edema. Telemetry: Sinus rhythm Gastrointestinal system: Abdomen is nondistended, soft and nontender. Normal bowel sounds heard. Central nervous system: Alert and oriented. No focal neurological deficits. Extremities: Symmetric 5 x 5 power.   Data Reviewed: Basic Metabolic Panel:  Recent Labs Lab 04/05/15 2049 04/06/15 0515 04/06/15 0930 04/07/15 0300 04/11/15 0722  NA 134* 132*  --  133* 139  K 4.2 4.4  --  4.2 3.7  CL 92* 89*  --  89* 99*  CO2 30 30  --  29 29  GLUCOSE 102* 99  --  85 88  BUN 23* 26*  --  39* 24*  CREATININE 10.90* 11.71*  --  14.55* 12.85*  CALCIUM 9.8 9.2  --  9.5 9.2  PHOS  --   --  7.0*  --   --    Liver Function Tests:  Recent Labs Lab 04/06/15 0515  AST 14*  ALT 9*  ALKPHOS 54  BILITOT 1.5*  PROT 6.8  ALBUMIN 2.5*   No results for input(s): LIPASE, AMYLASE in the last 168 hours. No results for input(s): AMMONIA in the last 168 hours. CBC:  Recent Labs Lab 04/05/15 2049 04/06/15 0515 04/07/15 0300 04/11/15 0722  WBC 11.5* 11.0* 8.8 8.0  NEUTROABS  --  8.6*  --   --   HGB 8.4* 8.0* 7.9* 8.0*  HCT 25.6* 23.9* 23.5* 26.0*  MCV 91.8 91.6  92.5 94.2  PLT 287 313 213 340   Cardiac Enzymes: No results for input(s): CKTOTAL, CKMB, CKMBINDEX, TROPONINI in the last 168 hours. BNP (last 3 results) No results for input(s): PROBNP in the last 8760 hours. CBG: No results for input(s): GLUCAP in the last 168 hours.  Recent Results (from the past 240 hour(s))  Blood culture (routine x 2)     Status: None   Collection Time: 04/05/15 10:26 PM  Result Value Ref Range Status   Specimen Description BLOOD RIGHT ARM  Final   Special Requests BOTTLES DRAWN AEROBIC AND ANAEROBIC 5CC  Final   Culture NO GROWTH 5 DAYS  Final   Report Status 04/10/2015 FINAL  Final  Culture, blood (single)     Status: None   Collection Time: 04/05/15 10:28 PM  Result Value Ref Range Status   Specimen Description BLOOD RIGHT FOREARM  Final   Special Requests BOTTLES DRAWN AEROBIC ONLY 10CC  Final   Culture NO GROWTH 5 DAYS  Final   Report Status 04/10/2015 FINAL  Final  Blood culture (routine x  2)     Status: None   Collection Time: 04/05/15 10:30 PM  Result Value Ref Range Status   Specimen Description BLOOD RIGHT WRIST  Final   Special Requests BOTTLES DRAWN AEROBIC AND ANAEROBIC 5CC  Final   Culture NO GROWTH 5 DAYS  Final   Report Status 04/10/2015 FINAL  Final  Culture, sputum-assessment     Status: None   Collection Time: 04/08/15  7:46 AM  Result Value Ref Range Status   Specimen Description SPUTUM  Final   Special Requests NONE  Final   Sputum evaluation   Final    MICROSCOPIC FINDINGS SUGGEST THAT THIS SPECIMEN IS NOT REPRESENTATIVE OF LOWER RESPIRATORY SECRETIONS. PLEASE RECOLLECT. Gram Stain Report Called to,Read Back By and Verified With: K. Johnney Ou AT 0825 ON [id number] BY Rhea Bleacher    Report Status 04/08/2015 FINAL  Final  AFB culture with smear     Status: None (Preliminary result)   Collection Time: 04/08/15  7:46 AM  Result Value Ref Range Status   Specimen Description SPUTUM  Final   Special Requests NONE  Final   Acid Fast  Smear   Final    NO ACID FAST BACILLI SEEN Performed at [facility]    Culture   Final    CULTURE WILL BE EXAMINED FOR 6 WEEKS BEFORE ISSUING A FINAL REPORT Performed at [facility]    Report Status PENDING  Incomplete  AFB culture with smear     Status: None (Preliminary result)   Collection Time: 04/09/15 12:42 PM  Result Value Ref Range Status   Specimen Description SPUTUM  Final   Special Requests NONE  Final   Acid Fast Smear   Final    NO ACID FAST BACILLI SEEN Performed at [facility]    Culture   Final    CULTURE WILL BE EXAMINED FOR 6 WEEKS BEFORE ISSUING A FINAL REPORT Performed at [facility]    Report Status PENDING  Incomplete  AFB culture with smear     Status: None (Preliminary result)   Collection Time: 04/10/15 10:15 AM  Result Value Ref Range Status   Specimen Description SPUTUM  Final   Special Requests NONE  Final   Acid Fast Smear   Final    NO ACID FAST BACILLI SEEN Performed at [facility]    Culture   Final    CULTURE WILL BE EXAMINED FOR 6 WEEKS BEFORE ISSUING A FINAL REPORT Performed at [facility]    Report Status PENDING  Incomplete          Studies: No results found.      Scheduled Meds: . amiodarone  200 mg Oral Daily  . calcitRIOL  2.25 mcg Oral Q T,Th,Sat-1800  . carvedilol  6.25 mg Oral BID WC  .  ceFAZolin (ANCEF) IV  2 g Intravenous Q T,Th,Sa-HD  . cinacalcet  90 mg Oral BID WC  . darbepoetin (ARANESP) injection - DIALYSIS  150 mcg Intravenous Q Thu-HD  . feeding supplement (NEPRO CARB STEADY)  237 mL Oral BID BM  . ferric gluconate (FERRLECIT/NULECIT) IV  125 mg Intravenous Q T,Th,Sa-HD  . lisinopril  20 mg Oral Daily  . multivitamin  1 tablet Oral QHS  . sevelamer carbonate  2,400 mg Oral TID WC  . vancomycin  1,000 mg Intravenous Q T,Th,Sa-HD  . warfarin  7.5 mg Oral q1800  . Warfarin - Pharmacist Dosing Inpatient   Does not apply [phone]  Continuous  Infusions: . sodium chloride      Principal Problem:   Endocarditis Active Problems:   End stage renal disease (HCC)   Hypertension   Chronic diastolic congestive heart failure (HCC)   Paroxysmal atrial fibrillation (HCC)   Cavitary lesion of lung   Mitral valve replaced   Anticoagulant long-term use   Anemia in chronic kidney disease   Coagulopathy (HCC)   Multiple pulmonary nodules   Renal mass    Time spent:  76 minutes    Worley Radermacher, MD, FACP, FHM. [facility] Pager [phone]  If 7PM-7AM, please contact night-coverage [email or web address] Password [email or web address] 04/12/2015, 2:59 PM    LOS: 7 days

## 2015-04-12 NOTE — Progress Notes (Signed)
Subjective:   Feeling better, back from TEE  Objective Filed Vitals:   04/12/15 1010 04/12/15 1020 04/12/15 1030 04/12/15 1033  BP: 108/61 100/65 111/83 121/81  Pulse: 70 67 67 66  Temp:      TempSrc:      Resp: _0 Height:      Weight:      SpO2: 97% 100% 98% 99%   Physical Exam General: alert and oriented, no acute distress.  Heart: RRR 2/6 murmur Lungs: CTA, unlabored. Dry cough Abdomen: soft, nontender +BS Extremities: no edema Dialysis Access:  L AVf +b.t  Dialysis Orders: [facility], 4hr, BFR 500 with 14g needles, DW 103kg, 2K 2 Ca, No Na modeling, micera115mg q 2 weeks, calcitriol 2.252m TTS  Assessment/Plan: 1. 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  2. ESRD -TTS - HD pending tomorrow - will have lower EDW at d/c  3. Anemia Fe deficiency- Hgb 8, stable- last Mircera 150 9/20 - given Aranesp 150 9/30 tsat 8% total Fe only 14 with ferritin 1337 - replete short course Fe- 8 4. Secondary hyperparathyroidism - uncontrolled iPTH 1470 when last checked - continue calcitriol, binders/sensipar- phos 7 5. HTN/volume - BP controlled on coreg and lisinopril. Lower edw at DC 6. Nutrition - losing weight alb 2.5; EDW recently lowered 2 kg and now below new edw- add supplements 7. Renal mass -evaluation by Dr. WrJeffie Pollockuggest he has probably hemoorhage into one or more of his acquired renal cysts. Rec ReCT in 3-4 weeks 8. 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 Coagulopathy- INR 2.16 10. PAF-on amio and coumadin  BrShelle IronNP [phone]/11/2014,12:11 PM  LOS: 7 days   Pt seen, examined and agree w A/P as  above.  RoKelly SplinterD pager [phone]  cell [phone] 41440/11/2014, 1:01 PM    Additional Objective Labs: Basic Metabolic Panel:  Recent Labs Lab 04/06/15 0515 04/06/15 0930 04/07/15 0300 04/11/15 0722  NA 132*  --  133* 139  K 4.4  --  4.2 3.7  CL 89*  --  89* 99*  CO2 30  --  29 29  GLUCOSE 99  --  85 88  BUN 26*  --  39* 24*  CREATININE 11.71*  --  14.55* 12.85*  CALCIUM 9.2  --  9.5 9.2  PHOS  --  7.0*  --   --    Liver Function Tests:  Recent Labs Lab 04/06/15 0515  AST 14*  ALT 9*  ALKPHOS 54  BILITOT 1.5*  PROT 6.8  ALBUMIN 2.5*   No results for input(s): LIPASE, AMYLASE in the last 168 hours. CBC:  Recent Labs Lab 04/05/15 2049 04/06/15 0515 04/07/15 0300 04/11/15 0722  WBC 11.5* 11.0* 8.8 8.0  NEUTROABS  --  8.6*  --   --   HGB 8.4* 8.0* 7.9* 8.0*  HCT 25.6* 23.9* 23.5* 26.0*  MCV 91.8 91.6 92.5 94.2  PLT 287 313 213 340   Blood Culture    Component Value Date/Time   SDES SPUTUM 04/10/2015 1015   SPECREQUEST NONE 04/10/2015 1015   CULT  04/10/2015 1015    CULTURE WILL  BE EXAMINED FOR 6 WEEKS BEFORE ISSUING A FINAL REPORT Performed at [place] PENDING 04/10/2015 1015    Cardiac Enzymes: No results for input(s): CKTOTAL, CKMB, CKMBINDEX, TROPONINI in the last 168 hours. CBG: No results for input(s): GLUCAP in the last 168 hours. Iron Studies: No results for input(s): IRON, TIBC, TRANSFERRIN, FERRITIN in the last 72 hours. _0 @ Studies/Results: No results found. Medications: . sodium chloride     . amiodarone  200 mg Oral Daily  . calcitRIOL  2.25 mcg Oral Q T,Th,Sat-1800  . carvedilol  6.25 mg Oral BID WC  .  ceFAZolin (ANCEF) IV  2 g Intravenous Q T,Th,Sa-HD  . cinacalcet  90 mg Oral BID WC  . darbepoetin (ARANESP) injection - DIALYSIS  150 mcg Intravenous Q Thu-HD  . feeding supplement (NEPRO CARB STEADY)  237 mL Oral BID BM  . ferric gluconate (FERRLECIT/NULECIT) IV  125 mg Intravenous  Q T,Th,Sa-HD  . lisinopril  20 mg Oral Daily  . multivitamin  1 tablet Oral QHS  . sevelamer carbonate  2,400 mg Oral TID WC  . vancomycin  1,000 mg Intravenous Q T,Th,Sa-HD  . Warfarin - Pharmacist Dosing Inpatient   Does not apply [phone]

## 2015-04-12 NOTE — Progress Notes (Signed)
ANTICOAGULATION CONSULT NOTE - Follow Up Consult  Pharmacy Consult for Coumadin Indication: atrial fibrillation  No Known Allergies  Patient Measurements: Height: 5\' 11"  (180.3 cm) Weight: 212 lb 15.4 oz (96.6 kg) IBW/kg (Calculated) : 75.3 Heparin Dosing Weight:   Vital Signs: Temp: 98 F (36.7 C) (10/05 1008) Temp Source: Oral (10/05 1008) BP: 121/81 mmHg (10/05 1033) Pulse Rate: 66 (10/05 1033)  Labs:  Recent Labs  04/10/15 0426 04/11/15 0722 04/12/15 0441  HGB  --  8.0*  --   HCT  --  26.0*  --   PLT  --  340  --   LABPROT 21.5* 23.4* 23.9*  INR 1.88* 2.10* 2.16*  CREATININE  --  12.85*  --     Estimated Creatinine Clearance: 8.4 mL/min (by C-G formula based on Cr of 12.85).   Medications:  Scheduled:  . amiodarone  200 mg Oral Daily  . calcitRIOL  2.25 mcg Oral Q T,Th,Sat-1800  . carvedilol  6.25 mg Oral BID WC  .  ceFAZolin (ANCEF) IV  2 g Intravenous Q T,Th,Sa-HD  . cinacalcet  90 mg Oral BID WC  . darbepoetin (ARANESP) injection - DIALYSIS  150 mcg Intravenous Q Thu-HD  . feeding supplement (NEPRO CARB STEADY)  237 mL Oral BID BM  . ferric gluconate (FERRLECIT/NULECIT) IV  125 mg Intravenous Q T,Th,Sa-HD  . lisinopril  20 mg Oral Daily  . multivitamin  1 tablet Oral QHS  . sevelamer carbonate  2,400 mg Oral TID WC  . vancomycin  1,000 mg Intravenous Q T,Th,Sa-HD  . Warfarin - Pharmacist Dosing Inpatient   Does not apply q1800    Assessment: 49yo male with AFib, admitted 9.28 with supratherapeutic INR on 9/28.  INR is now therapeutic.  No bleeding noted.  Goal of Therapy:  INR 2-3 Monitor platelets by anticoagulation protocol: Yes   Plan:  Coumadin 7.5mg  daily Continue daily INR Watch for s/s of bleeding  Gracy Bruins, PharmD [facility]

## 2015-04-12 NOTE — H&P (View-Only) (Signed)
PROGRESS NOTE  Timothy Palmer ZOX:[id number] DOB: 05-08-1966 DOA: 04/05/2015 PCP: Louis Meckel, MD  Brief history 49 year old male with history of ESRD, hypertension, MSSA bacteremia with mitral valve endocarditis, Atrial fibrillation, hypertension presented with 2 weeks of worsening cough with yellow-green sputum as well as subjective fevers and chills. The patient had an abnormal chest x-ray in outpatient setting. As result CT of the chest with intravenous contrast was obtained. 04/05/2015 CT of the chest revealed bilateral mediastinal and hilar lymphadenopathy as well as improving bilateral patchy ground glass opacities. There was a new left upper lobe 1.5 cm cavitary nodule. As result, the patient was advised to come to the hospital for further workup.  Notably, the patient had MSSA bacteremia back in May 2015 and August 2015. 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 He subsequently underwent a below the knee left popliteal bypass and embolectomy. The patient completed 6 weeks of cefazolin after his mitral valve replacement. Pt states he last smoked marijuana 2 weeks ago and has remote hx of tobacco for which he only smoked 2 years over 15 yrs ago. Assessment/Plan: Cavitary lung nodule -Patient has bilateral mediastinal and hilar lymphadenopathy -Given the patient's clinical history, concerning for septic emboli -Other considerations include atypical mycobacterial infection, malignancy, and fungal infection -Clinical suspicion for MTB is low--consider discontinuation respiratory isolation if okay with ID -Appreciate pulmonary--spoke with Dr. Eula Fried not amendable to bronchoscopy-->PET in out pt setting -Continue  respiratory isolation--sputum for AFB x 3--NEG -Quantiferon TB--negative -blood cultures--neg to date x 5 days -ID following -TEE--RESCHEDULED FOR 04/12/15 '@9AM'  (3rd AFB did not result in time) -if TEE and AFB neg--possible d/c abx and observe -check ACE--pending -ESR--126 ESRD  -Appreciate nephrology followup -Dialysis on Tuesday, Thursday, Saturdays  Status post mitral valve replacement  -Status post mitral valve replacement--04/05/2014 secondary to endocarditis with history of MSSA bacteremia  -Repeat echocardiogram (TTE)--no vegetation  Coagulopathy  -INR >10 -vitamin K x 1-->INR 2.1-->1.63 -likely due to acute infection -check fibrinogen, peripheral smear--not consistent with DIC -Restarted coumadin  -recheck INR in am Renal Mass -CT abd/pelvis with renal mass protocol--large left complex lesion with perinephric stranding--Hemorrhagic cyst -appreciate urology opinion--repeat CT in 2-3 weeks Paroxysmal atrial fibrillation  -This occurred postoperatively  -presently in sinus -Restarted warfarin -Continue amiodarone History of MSSA mitral valve endocarditis (native valve) -Finished 6 weeks of cefazolin after mitral valve replacement  HTN -continue coreg and lisinopril   Family Communication: Pt at beside Disposition Plan: Home when cleared by ID        Procedures/Studies: Ct Abdomen Pelvis W Wo Contrast  04/06/2015   CLINICAL DATA:  48 year old hemodialysis patient with left upper quadrant abdominal mass on chest CT, incompletely visualized. Chest CT performed for cough, weakness and anorexia.  EXAM: CT ABDOMEN AND PELVIS WITHOUT AND WITH CONTRAST  TECHNIQUE: Multidetector CT imaging of the abdomen and pelvis was performed following the standard protocol before and following the bolus administration of intravenous contrast.  CONTRAST:  141m OMNIPAQUE IOHEXOL 300 MG/ML  SOLN  COMPARISON:  Chest CT 04/05/2015.  Abdominal CTA 03/24/2014.  FINDINGS: Lower  chest: The visualized lung bases are stable from yesterday's chest CT. There is pleural thickening and calcified pleural plaque formation on the right. Prosthetic mitral valve  noted.  Hepatobiliary: The liver is normal in density without focal abnormality. There are multiple calcified gallstones. The gallbladder is contracted with mild wall thickening, but no surrounding inflammation. There is no gallbladder wall thickening or evidence of choledocholithiasis.  Pancreas: Unremarkable. No pancreatic ductal dilatation or surrounding inflammatory changes.  Spleen: Normal in size without focal abnormality.  Adrenals/Urinary Tract: Both adrenal glands appear normal. Both kidneys are enlarged by multiple cystic lesions consistent with adult polycystic kidney disease. On the right, a few these demonstrate thin peripheral calcifications. There is an area of ill-defined enhancement medially in the lower pole of the right kidney, measuring approximately 11 mm on image 89 of series 7. This enhances from 48 HU prior to contrast to 168 HU on the immediate postcontrast images. No other suspicious findings are present on the right. The left kidney demonstrates 2 complex lesions and diffuse perinephric soft tissue stranding. In the upper interpolar region, there is a 5.7 x 6.8 cm lesion on image 63 of series 2 which does not show any significant enhancement, measuring 39 HU prior to contrast, 39 HU on the immediate postcontrast images and 39 HU on the delayed images. In the lower pole, there is a large irregular lesion measuring 10.2 x 6.9 cm on image 84 of series 2. This does not show significant enhancement either, measuring 46 HU, 51 HU and 56 HU on the 3 phases. There is no definite enhancing renal lesion or hydronephrosis. There is no contrast excretion following contrast. The bladder demonstrates intermediate density within its lumen, but no apparent focal abnormality.  Stomach/Bowel: No evidence of bowel wall thickening,  distention or surrounding inflammatory change.  Vascular/Lymphatic: There are no enlarged abdominal or pelvic lymph nodes. Mild aortoiliac atherosclerosis appears unchanged.  Reproductive: There is mild enlargement of the prostate gland which demonstrates central dystrophic calcifications. There are prominent calcifications of the vas deferens.  Other: Small umbilical hernia containing fat.  Musculoskeletal: No acute or significant osseous findings. Degenerative disc disease noted at the lumbosacral junction.  IMPRESSION: 1. There are 2 large complex lesions involving the interpolar and lower pole regions of the left kidney with associated perinephric soft tissue stranding. Neither lesion demonstrates significant enhancement. Findings are most consistent with hemorrhagic or infected cysts. Neoplasm is unlikely given the lack of significant enhancement. 2. There is a small enhancing nodule inferomedially in the right kidney which is not well-defined, although could reflect a small tumor or vascular lesion. 3. No evidence of obstructing calculus. 4. Cholelithiasis. 5. If there is clinical concern of an infected renal cyst, cyst aspiration under ultrasound could be attempted. Otherwise, management options include CT or MR follow-up of the bilateral lesions.   Electronically Signed   By: Richardean Sale M.D.   On: 04/06/2015 21:57   Dg Chest 2 View  03/19/2015   CLINICAL DATA:  Chest pain  EXAM: CHEST  2 VIEW  COMPARISON:  05/30/2014  FINDINGS: Stable mild cardiomegaly and vascular pedicle widening in this patient post mitral valve repair.  Pleural and parenchymal scarring on the right with volume loss which is presumably postoperative.  Subtle small density in the peripheral left upper chest near the posterior fifth rib is likely summation shadows with the scapula. There is no edema, consolidation, effusion, or pneumothorax.  No acute osseous finding to explain chest pain.  IMPRESSION: 1. No active cardiopulmonary  disease. 2. Stable pleural parenchymal scarring on the right.   Electronically Signed   By: Monte Fantasia M.D.   On: 03/19/2015 17:58  Ct Chest W Contrast  04/05/2015   CLINICAL DATA:  Cough. Weakness. Anorexia for 2.5 weeks. Prior mitral valve replacement.  EXAM: CT CHEST WITH CONTRAST  TECHNIQUE: Multidetector CT imaging of the chest was performed during intravenous contrast administration.  CONTRAST:  39m ISOVUE-300 IOPAMIDOL (ISOVUE-300) INJECTION 61%  COMPARISON:  03/22/2014 chest CT angiogram. 03/19/2015 chest radiograph.  FINDINGS: Mediastinum/Nodes: Mitral valve prosthesis appears well positioned. Mild cardiomegaly, decreased. No pericardial fluid/thickening. There is atherosclerosis of the thoracic aorta, the great vessels of the mediastinum and the coronary arteries, including calcified atherosclerotic plaque in the left anterior descending, left circumflex and right coronary arteries. Great vessels are normal in course and caliber. No central pulmonary emboli. There is a partially calcified 1.5 cm hypodense posterior left thyroid lobe nodule, not appreciably changed. Normal esophagus. No axillary lymphadenopathy. Stable mildly enlarged 1.4 cm upper right paratracheal node (series 3/image 11). Stable mildly enlarged 1.5 cm subcarinal node (3/32). Stable aortopulmonary window lymphadenopathy, with the largest node measuring 1.9 cm (3/22), stable. There are mildly enlarged hilar nodes bilaterally, including a 1.5 cm right hilar node (3/20) and a 1.1 cm left hilar node (3/29), stable.  Lungs/Pleura: No pneumothorax. There are calcified pleural plaques in the mid and basilar right pleural space with trace right pleural effusion/pleural thickening, decreased. One of the right hemidiaphragmatic calcified pleural plaques is new (series 4/image 39) . No left pleural effusion. There is patchy ground-glass opacity throughout both lungs, slightly less prominent in the interval. There is a 1.5 cm new cavitary  nodule in the peripheral left upper lobe (series 4/image 18). There is a new subsolid 1.6 x 1.4 cm medial right upper lobe pulmonary nodule (4/22). There are several additional sub cm sub solid new pulmonary nodules throughout both lungs (at least 10).  Upper abdomen: Multiple subcentimeter calcified gallstones are seen layering in the nondistended non thick walled gallbladder. No biliary ductal dilatation. Partially visualized is a polycystic left kidney. There is an indeterminate incompletely characterized and partially visualized exophytic hypodense mass in the lateral upper left kidney measuring at least 5.1 x 5.0 cm (series 3/image 66), which was not apparent on the 03/24/2014 CT abdomen study.  Musculoskeletal: No aggressive appearing focal osseous lesions. There is bilateral gynecomastia, asymmetric to the right, increased in the interval. Mild degenerative changes in the thoracic spine.  IMPRESSION: 1. Several (at least 10) new subsolid pulmonary nodules scattered throughout both lungs, largest 1.6 cm in the right upper lobe, including a cavitary 1.5 cm left upper lobe pulmonary nodule. Differential considerations include septic pulmonary emboli, fungal pneumonia and less likely pulmonary metastases or tuberculosis. Consider short-term follow-up post treatment chest CT in 4-6 weeks. 2. Mild cardiomegaly with patchy ground-glass opacity throughout both lungs, both which are decreased compared to the 03/22/2014 chest CT study, favor improved mild pulmonary edema. 3. Progressive calcified pleural plaques in the right pleural space. Trace right pleural effusion/thickening, decreased. 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 4. Stable nonspecific mediastinal and bilateral hilar lymphadenopathy. 5. Incompletely visualized indeterminate hypodense mass in the left upper quadrant of the abdomen, which appears to arise from the lateral upper left kidney, measures at least 5.1 cm in size and  appears new since the 03/24/2014 CT abdomen study. Recommend further evaluation with renal mass protocol CT or MRI of the abdomen with and without intravenous contrast. 6. Atherosclerosis, including three-vessel coronary artery disease. Please note that although the presence of coronary artery calcium documents the presence of coronary artery disease, the severity of this  disease and any potential stenosis cannot be assessed on this non-gated CT examination. Assessment for potential risk factor modification, dietary therapy or pharmacologic therapy may be warranted, if clinically indicated. 7. Cholelithiasis. These results were called by telephone at the time of interpretation on 04/05/2015 at 5:42 pm to Dr. Posey Pronto, who verbally acknowledged these results.   Electronically Signed   By: Ilona Sorrel M.D.   On: 04/05/2015 17:42         Subjective: Patient still complains of a cough but states that it has improved. Denies any hemoptysis, fevers, chills, chest pain, shortness breath, nausea, vomiting, diarrhea, abdominal pain. No dysuria or hematuria. No rashes.  Objective: Filed Vitals:   04/10/15 0913 04/10/15 2053 04/11/15 0536 04/11/15 1000  BP: 124/75 114/80 132/92 109/68  Pulse: 72 68 79 66  Temp: 98 F (36.7 C) 98.3 F (36.8 C) 98.4 F (36.9 C) 98.9 F (37.2 C)  TempSrc: Oral Oral Oral Oral  Resp: '18 17 18 18  ' Height:      Weight:  99.4 kg (219 lb 2.2 oz)    SpO2: 100% 100% 93% 98%    Intake/Output Summary (Last 24 hours) at 04/11/15 1553 Last data filed at 04/11/15 0603  Gross per 24 hour  Intake    530 ml  Output      0 ml  Net    530 ml   Weight change: -0.2 kg (-7.1 oz) Exam:   General:  Pt is alert, follows commands appropriately, not in acute distress  HEENT: No icterus, No thrush, No neck mass, /AT  Cardiovascular: RRR, S1/S2, no rubs, no gallops  Respiratory: CTA bilaterally, no wheezing, no crackles, no rhonchi  Abdomen: Soft/+BS, non tender, non distended,  no guarding  Extremities: No edema, No lymphangitis, No petechiae, No rashes, no synovitis  Data Reviewed: Basic Metabolic Panel:  Recent Labs Lab 04/05/15 2049 04/06/15 0515 04/06/15 0930 04/07/15 0300 04/11/15 0722  NA 134* 132*  --  133* 139  K 4.2 4.4  --  4.2 3.7  CL 92* 89*  --  89* 99*  CO2 30 30  --  29 29  GLUCOSE 102* 99  --  85 88  BUN 23* 26*  --  39* 24*  CREATININE 10.90* 11.71*  --  14.55* 12.85*  CALCIUM 9.8 9.2  --  9.5 9.2  PHOS  --   --  7.0*  --   --    Liver Function Tests:  Recent Labs Lab 04/06/15 0515  AST 14*  ALT 9*  ALKPHOS 54  BILITOT 1.5*  PROT 6.8  ALBUMIN 2.5*   No results for input(s): LIPASE, AMYLASE in the last 168 hours. No results for input(s): AMMONIA in the last 168 hours. CBC:  Recent Labs Lab 04/05/15 2049 04/06/15 0515 04/07/15 0300 04/11/15 0722  WBC 11.5* 11.0* 8.8 8.0  NEUTROABS  --  8.6*  --   --   HGB 8.4* 8.0* 7.9* 8.0*  HCT 25.6* 23.9* 23.5* 26.0*  MCV 91.8 91.6 92.5 94.2  PLT 287 313 213 340   Cardiac Enzymes: No results for input(s): CKTOTAL, CKMB, CKMBINDEX, TROPONINI in the last 168 hours. BNP: Invalid input(s): POCBNP CBG: No results for input(s): GLUCAP in the last 168 hours.  Recent Results (from the past 240 hour(s))  Blood culture (routine x 2)     Status: None   Collection Time: 04/05/15 10:26 PM  Result Value Ref Range Status   Specimen Description BLOOD RIGHT ARM  Final  Special Requests BOTTLES DRAWN AEROBIC AND ANAEROBIC 5CC  Final   Culture NO GROWTH 5 DAYS  Final   Report Status 04/10/2015 FINAL  Final  Culture, blood (single)     Status: None   Collection Time: 04/05/15 10:28 PM  Result Value Ref Range Status   Specimen Description BLOOD RIGHT FOREARM  Final   Special Requests BOTTLES DRAWN AEROBIC ONLY 10CC  Final   Culture NO GROWTH 5 DAYS  Final   Report Status 04/10/2015 FINAL  Final  Blood culture (routine x 2)     Status: None   Collection Time: 04/05/15 10:30 PM    Result Value Ref Range Status   Specimen Description BLOOD RIGHT WRIST  Final   Special Requests BOTTLES DRAWN AEROBIC AND ANAEROBIC 5CC  Final   Culture NO GROWTH 5 DAYS  Final   Report Status 04/10/2015 FINAL  Final  Culture, sputum-assessment     Status: None   Collection Time: 04/08/15  7:46 AM  Result Value Ref Range Status   Specimen Description SPUTUM  Final   Special Requests NONE  Final   Sputum evaluation   Final    MICROSCOPIC FINDINGS SUGGEST THAT THIS SPECIMEN IS NOT REPRESENTATIVE OF LOWER RESPIRATORY SECRETIONS. PLEASE RECOLLECT. Gram Stain Report Called to,Read Back By and Verified With: K. Johnney Ou AT 0825 ON [id number] BY Rhea Bleacher    Report Status 04/08/2015 FINAL  Final  AFB culture with smear     Status: None (Preliminary result)   Collection Time: 04/08/15  7:46 AM  Result Value Ref Range Status   Specimen Description SPUTUM  Final   Special Requests NONE  Final   Acid Fast Smear   Final    NO ACID FAST BACILLI SEEN Performed at [facility]    Culture   Final    CULTURE WILL BE EXAMINED FOR 6 WEEKS BEFORE ISSUING A FINAL REPORT Performed at [facility]    Report Status PENDING  Incomplete  AFB culture with smear     Status: None (Preliminary result)   Collection Time: 04/09/15 12:42 PM  Result Value Ref Range Status   Specimen Description SPUTUM  Final   Special Requests NONE  Final   Acid Fast Smear   Final    NO ACID FAST BACILLI SEEN Performed at [facility]    Culture   Final    CULTURE WILL BE EXAMINED FOR 6 WEEKS BEFORE ISSUING A FINAL REPORT Performed at [facility]    Report Status PENDING  Incomplete  AFB culture with smear     Status: None (Preliminary result)   Collection Time: 04/10/15 10:15 AM  Result Value Ref Range Status   Specimen Description SPUTUM  Final   Special Requests NONE  Final   Acid Fast Smear   Final    NO ACID FAST BACILLI SEEN Performed at [facility]    Culture    Final    CULTURE WILL BE EXAMINED FOR 6 WEEKS BEFORE ISSUING A FINAL REPORT Performed at [facility]    Report Status PENDING  Incomplete     Scheduled Meds: . amiodarone  200 mg Oral Daily  . calcitRIOL  2.25 mcg Oral Q T,Th,Sat-1800  . carvedilol  6.25 mg Oral BID WC  .  ceFAZolin (ANCEF) IV  2 g Intravenous Q T,Th,Sa-HD  . cinacalcet  90 mg Oral BID WC  . darbepoetin (ARANESP) injection - DIALYSIS  150 mcg Intravenous Q Thu-HD  . feeding  supplement (NEPRO CARB STEADY)  237 mL Oral BID BM  . ferric gluconate (FERRLECIT/NULECIT) IV  125 mg Intravenous Q T,Th,Sa-HD  . lisinopril  20 mg Oral Daily  . multivitamin  1 tablet Oral QHS  . sevelamer carbonate  2,400 mg Oral TID WC  . vancomycin  1,000 mg Intravenous Q T,Th,Sa-HD  . warfarin  7.5 mg Oral ONCE-1800  . Warfarin - Pharmacist Dosing Inpatient   Does not apply q1800   Continuous Infusions:    Lloyde Ludlam, DO  [facility] Pager [phone]  If 7PM-7AM, please contact night-coverage [email or web address] Password [email or web address] 04/11/2015, 3:53 PM   LOS: 6 days

## 2015-04-13 ENCOUNTER — Encounter (HOSPITAL_COMMUNITY): Payer: Medicare Other

## 2015-04-13 ENCOUNTER — Encounter (HOSPITAL_COMMUNITY): Payer: Self-pay | Admitting: Nephrology

## 2015-04-13 ENCOUNTER — Inpatient Hospital Stay (HOSPITAL_COMMUNITY): Payer: Medicare Other

## 2015-04-13 DIAGNOSIS — Z952 Presence of prosthetic heart valve: Secondary | ICD-10-CM

## 2015-04-13 DIAGNOSIS — I480 Paroxysmal atrial fibrillation: Secondary | ICD-10-CM

## 2015-04-13 DIAGNOSIS — I350 Nonrheumatic aortic (valve) stenosis: Secondary | ICD-10-CM

## 2015-04-13 DIAGNOSIS — I340 Nonrheumatic mitral (valve) insufficiency: Secondary | ICD-10-CM

## 2015-04-13 DIAGNOSIS — I330 Acute and subacute infective endocarditis: Secondary | ICD-10-CM

## 2015-04-13 DIAGNOSIS — I10 Essential (primary) hypertension: Secondary | ICD-10-CM

## 2015-04-13 DIAGNOSIS — Z7901 Long term (current) use of anticoagulants: Secondary | ICD-10-CM

## 2015-04-13 DIAGNOSIS — R918 Other nonspecific abnormal finding of lung field: Secondary | ICD-10-CM

## 2015-04-13 DIAGNOSIS — R590 Localized enlarged lymph nodes: Secondary | ICD-10-CM | POA: Insufficient documentation

## 2015-04-13 LAB — RENAL FUNCTION PANEL
ALBUMIN: 2.4 g/dL — AB (ref 3.5–5.0)
Anion gap: 12 (ref 5–15)
BUN: 27 mg/dL — AB (ref 6–20)
CALCIUM: 9.3 mg/dL (ref 8.9–10.3)
CO2: 27 mmol/L (ref 22–32)
Chloride: 95 mmol/L — ABNORMAL LOW (ref 101–111)
Creatinine, Ser: 10.76 mg/dL — ABNORMAL HIGH (ref 0.61–1.24)
GFR calc Af Amer: 6 mL/min — ABNORMAL LOW (ref >60)
GFR calc non Af Amer: 5 mL/min — ABNORMAL LOW (ref >60)
GLUCOSE: 117 mg/dL — AB (ref 65–99)
PHOSPHORUS: 5.1 mg/dL — AB (ref 2.5–4.6)
POTASSIUM: 6 mmol/L — AB (ref 3.5–5.1)
SODIUM: 134 mmol/L — AB (ref 135–145)

## 2015-04-13 LAB — POCT I-STAT, CHEM 8
BUN: 16 mg/dL (ref 6–20)
CREATININE: 5.7 mg/dL — AB (ref 0.61–1.24)
Calcium, Ion: 1.15 mmol/L (ref 1.12–1.23)
Chloride: 96 mmol/L — ABNORMAL LOW (ref 101–111)
GLUCOSE: 91 mg/dL (ref 65–99)
HCT: 31 % — ABNORMAL LOW (ref 39.0–52.0)
HEMOGLOBIN: 10.5 g/dL — AB (ref 13.0–17.0)
POTASSIUM: 3.1 mmol/L — AB (ref 3.5–5.1)
Sodium: 138 mmol/L (ref 135–145)
TCO2: 29 mmol/L (ref 0–100)

## 2015-04-13 LAB — CBC
HCT: 19.2 % — ABNORMAL LOW (ref 39.0–52.0)
Hemoglobin: 6 g/dL — CL (ref 13.0–17.0)
MCH: 29.1 pg (ref 26.0–34.0)
MCHC: 31.3 g/dL (ref 30.0–36.0)
MCV: 93.2 fL (ref 78.0–100.0)
Platelets: 444 10*3/uL — ABNORMAL HIGH (ref 150–400)
RBC: 2.06 MIL/uL — ABNORMAL LOW (ref 4.22–5.81)
RDW: 17.7 % — AB (ref 11.5–15.5)
WBC: 9.3 10*3/uL (ref 4.0–10.5)

## 2015-04-13 LAB — MPO/PR-3 (ANCA) ANTIBODIES

## 2015-04-13 LAB — VANCOMYCIN, RANDOM: VANCOMYCIN RM: 26 ug/mL

## 2015-04-13 LAB — ANCA TITERS
C-ANCA: <1:20 {titer}
P-ANCA: <1:20 {titer}

## 2015-04-13 LAB — HIV ANTIBODY (ROUTINE TESTING W REFLEX): HIV Screen 4th Generation wRfx: NONREACTIVE

## 2015-04-13 LAB — HEMOGLOBIN AND HEMATOCRIT, BLOOD
HCT: 29.4 % — ABNORMAL LOW (ref 39.0–52.0)
Hemoglobin: 9.6 g/dL — ABNORMAL LOW (ref 13.0–17.0)

## 2015-04-13 LAB — PROTIME-INR
INR: 2.15 — AB (ref 0.00–1.49)
PROTHROMBIN TIME: 23.8 s — AB (ref 11.6–15.2)

## 2015-04-13 LAB — ANTINUCLEAR ANTIBODIES, IFA: ANA Ab, IFA: NEGATIVE

## 2015-04-13 MED ORDER — LIDOCAINE-PRILOCAINE 2.5-2.5 % EX CREA
1.0000 "application " | TOPICAL_CREAM | CUTANEOUS | Status: DC | PRN
  Filled 2015-04-13: qty 5

## 2015-04-13 MED ORDER — HEPARIN SODIUM (PORCINE) 1000 UNIT/ML DIALYSIS: 1000.0000 [IU] | INTRAMUSCULAR | Status: DC | PRN

## 2015-04-13 MED ORDER — PENTAFLUOROPROP-TETRAFLUOROETH EX AERO: 1.0000 "application " | INHALATION_SPRAY | CUTANEOUS | Status: DC | PRN

## 2015-04-13 MED ORDER — LIDOCAINE HCL (PF) 1 % IJ SOLN: 5.0000 mL | INTRAMUSCULAR | Status: DC | PRN

## 2015-04-13 MED ORDER — SODIUM CHLORIDE 0.9 % IV SOLN: 100.0000 mL | INTRAVENOUS | Status: DC | PRN

## 2015-04-13 MED ORDER — DARBEPOETIN ALFA 150 MCG/0.3ML IJ SOSY
PREFILLED_SYRINGE | INTRAMUSCULAR | Status: AC
  Filled 2015-04-13: qty 0.3

## 2015-04-13 MED ORDER — ALTEPLASE 2 MG IJ SOLR
2.0000 mg | Freq: Once | INTRAMUSCULAR | Status: DC | PRN
  Filled 2015-04-13: qty 2

## 2015-04-13 NOTE — Progress Notes (Signed)
Pt arrived back to unit with BP of 71/48. Asymptomatic. Attending made aware. Nephrology paged. Pt currently resting in bed. Will continue to monitor pt.   Carole Civil, RN

## 2015-04-13 NOTE — Progress Notes (Signed)
Name: Timothy Palmer MRN: [id number] DOB: 26-Jan-1966    ADMISSION DATE:  04/05/2015 CONSULTATION DATE:  9/29  REFERRING MD :  [facility]   CHIEF COMPLAINT:  Cavitary lung nodule   BRIEF PATIENT DESCRIPTION: 49yo with hx ESRD, HTN, chronic dCHF with hx mitral valve endocarditis with valve replacement 2015, Afib on coumadin.  Presented 9/28 with 2 weeks hx cough, SOB, night sweats, fever.  Abnormal CXR prompted CT chest which revealed cavitary lung lesion and PCCM consulted  SIGNIFICANT EVENTS  9/28 - Admitted to hospital 10/5 - TEE  STUDIES:  CT CHEST W/ 9/28 - Several (at least 10) new subsolid pulmonary nodules scattered throughout both lungs, largest 1.6 cm in the right upper lobe, including a cavitary 1.5 cm left upper lobe pulmonary nodule.  Progressive calcified pleural plaques in the right pleural space.  Incompletely visualized indeterminate hypodense mass in the left upper quadrant of the abdomen, which appears to arise from the lateral upper left kidney, measures at least 5.1 cm in size and appears new since the 03/24/2014 CT abdomen study.  CT ABDOMEN/PELVIS W/ & W/O 9/29 - 2 complex lesions left kidney without enhancement most consistent with hemorrhagic or infected cysts. Neoplasm unlikely. Small enhancing nodule right kidney.  TEE 10/5 - Small mobile vegetation on anterior leaflet of mitral valve. Mild aortic stenosis.  SUBJECTIVE: No chest pain, pressure, or palpitations. Continues to have mild intermittent nonproductive cough. Denies any dyspnea.  REVIEW OF SYSTEMS: Continues to have sweats overnight. Denies any subjective fever or chills. Appetite is improving. Denies any nausea or vomiting.  VITAL SIGNS: Temp:  [97.8 F (36.6 C)-98.5 F (36.9 C)] 98.1 F (36.7 C) (10/06 0705) Pulse Rate:  [66-80] 69 (10/06 1000) Resp:  [18-24] 20 (10/06 1000) BP: (100-131)/(65-94) 119/89 mmHg (10/06 1000) SpO2:  [92 %-100 %] 92 % (10/06 0705) Weight:  [217 lb 13 oz (98.8 kg)] 217 lb  13 oz (98.8 kg) (10/06 0705)  PHYSICAL EXAMINATION: General:  Patient examined and hemodialysis. No distress. Awake watching TV in his bed. Integument: Warm and dry. No rash on exposed skin.  HEENT:  Moist mucus membranes. Poor dentition. No scleral injection.  Cardiovascular:  Regular rate. No edema.  Pulmonary: Clear to auscultation bilaterally. No accessory muscle use. Intermittent nonproductive cough. Abdomen: Soft. Normal bowel sounds. Mildly protuberant. Grossly nontender. Musculoskeletal:  Normal bulk and tone. No joint deformity or effusion appreciated.   Recent Labs Lab 04/07/15 0300 04/11/15 0722 04/13/15 0821 04/13/15 0912  NA 133* 139 134* 138  K 4.2 3.7 6.0* 3.1*  CL 89* 99* 95* 96*  CO2 29 29 27   --   BUN 39* 24* 27* 16  CREATININE 14.55* 12.85* 10.76* 5.70*  GLUCOSE 85 88 117* 91    Recent Labs Lab 04/07/15 0300 04/11/15 0722 04/13/15 0822 04/13/15 0912 04/13/15 0936  HGB 7.9* 8.0* 6.0* 10.5* 9.6*  HCT 23.5* 26.0* 19.2* 31.0* 29.4*  WBC 8.8 8.0 9.3  --   --   PLT 213 340 444*  --   --    No results found.  MICROBIOLOGY SPUTUM AFB 10/3>>>smear neg/pending SPUTUM AFB 10/2>>>smear neg/pending SPUTUM AFB 10/1>>>smear neg/pending  QUANTIFERON-TB 9/29 - negative HIV - negative  BLOOD CTX 9/28>>>  ANTIBIOTICS Cefazolin 9/29 >>> Vancomycin 9/29 >>>  SEROLOGIES ANCA 10/5>>> ANA 10/5>>>  ASSESSMENT / PLAN: 49 year old male with history of bacteremia presenting with cavitary lung lesion & mediastinal adenopathy. Adenopathy has been present for some time going back to September 2015. This is of unclear significance  and could represent sarcoidosis. The the presence of the mitral valve vegetation in conjunction with the cavitary lesions in his lungs would suggest possible embolic etiology from an alternative source such as Lemierre's Syndrome given his poor dentition. I can find no evidence of autoimmune serologies having been sent.   1. Cavitary lung  lesion & lung nodules:  suspect infectious embolic phenomenon. Ordered venous ultrasound to evaluate patient's jugular veins on 10/5. Also ordered serum ANA & ANCA.  2. 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   3. Mitral valve vegetation: Currently on vancomycin & Ancef. Management per ID.  Sonia Baller Ashok Cordia, M.D. [facility] Pager:  [phone] After 3pm or if no response, call [phone] Pager # [phone] OR # [phone] if no answer

## 2015-04-13 NOTE — Progress Notes (Signed)
*  PRELIMINARY RESULTS* Vascular Ultrasound Limited venous duplex of Bilateral Internal Jugular veins has been completed.  Preliminary findings: No DVT noted.     Landry Mellow, RDMS, RVT  04/13/2015, 12:26 PM

## 2015-04-13 NOTE — Progress Notes (Signed)
Subjective:   Feeling better, back from TEE  Objective Filed Vitals:   04/13/15 1030 04/13/15 1045 04/13/15 1100 04/13/15 1110  BP: 1'07/79 86/69 94/68 ' 100/69  Pulse: 73 70 67 74  Temp:    97.5 F (36.4 C)  TempSrc:    Oral  Resp: '20 17 18 18  ' Height:      Weight:      SpO2:       Physical Exam General: alert and oriented, no acute distress.  Heart: RRR 2/6 murmur Lungs: CTA, unlabored. Dry cough Abdomen: soft, nontender +BS Extremities: no edema Dialysis Access:  L AVf +b.t  Dialysis Orders: [facility], 4hr, BFR 500 with 14g needles, DW 103kg, 2K 2 Ca, No Na modeling, micera141mg q 2 weeks, calcitriol 2.222m TTS  Assessment/Plan: 1. 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  2. ESRD -TTS - HD pending tomorrow - will have lower EDW at d/c  3. Anemia Fe deficiency- Hgb 8, stable- last Mircera 150 9/20 - given Aranesp 150 9/30 tsat 8% total Fe only 14 with ferritin 1337 - replete short course Fe- 8 4. Secondary hyperparathyroidism - uncontrolled iPTH 1470 when last checked - continue calcitriol, binders/sensipar- phos 7 5. HTN/volume - BP controlled on coreg and lisinopril. Lower edw at DC 6. Nutrition - losing weight alb 2.5; EDW recently lowered 2 kg and now below new edw- add supplements 7. Renal mass -evaluation by Dr. WrJeffie Pollockuggest he has probably hemoorhage into one or more of his acquired renal cysts. Rec ReCT in 3-4 weeks 8. 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 Coagulopathy- INR 2.16 10. PAF-on amio and coumadin  BrShelle IronNP [facility] Beeper [phone]/12/2014,11:40 AM  LOS: 8 days   Pt seen, examined and agree w  A/P as above. Has MV vegetation which presumably is new.  ID consulted. For HD today.  RoKelly SplinterD pager [phone]  cell [phone]/12/2014, 11:40 AM    Additional Objective Labs: Basic Metabolic Panel:  Recent Labs Lab 04/07/15 0300 04/11/15 0722 04/13/15 0821 04/13/15 0912  NA 133* 139 134* 138  K 4.2 3.7 6.0* 3.1*  CL 89* 99* 95* 96*  CO2 '29 29 27  ' --   GLUCOSE 85 88 117* 91  BUN 39* 24* 27* 16  CREATININE 14.55* 12.85* 10.76* 5.70*  CALCIUM 9.5 9.2 9.3  --   PHOS  --   --  5.1*  --    Liver Function Tests:  Recent Labs Lab 04/13/15 0821  ALBUMIN 2.4*   No results for input(s): LIPASE, AMYLASE in the last 168 hours. CBC:  Recent Labs Lab 04/07/15 0300 04/11/15 0722 04/13/15 0822 04/13/15 0912 04/13/15 0936  WBC 8.8 8.0 9.3  --   --   HGB 7.9* 8.0* 6.0* 10.5* 9.6*  HCT 23.5* 26.0* 19.2* 31.0* 29.4*  MCV 92.5 94.2 93.2  --   --   PLT 213 340 444*  --   --    Blood Culture    Component Value Date/Time   SDES SPUTUM 04/10/2015 1015   SPECREQUEST NONE 04/10/2015 1015   CULT  04/10/2015 1015    CULTURE WILL BE EXAMINED FOR 6  WEEKS BEFORE ISSUING A FINAL REPORT Performed at [facility] PENDING 04/10/2015 1015    Cardiac Enzymes: No results for input(s): CKTOTAL, CKMB, CKMBINDEX, TROPONINI in the last 168 hours. CBG: No results for input(s): GLUCAP in the last 168 hours. Iron Studies: No results for input(s): IRON, TIBC, TRANSFERRIN, FERRITIN in the last 72 hours. '@lablastinr3' @ Studies/Results: No results found. Medications: . sodium chloride     . amiodarone  200 mg Oral Daily  . calcitRIOL  2.25 mcg Oral Q T,Th,Sat-1800  . carvedilol  6.25 mg Oral BID WC  .  ceFAZolin (ANCEF) IV  2 g Intravenous Q T,Th,Sa-HD  . cinacalcet  90 mg Oral BID WC  . darbepoetin (ARANESP) injection - DIALYSIS  150 mcg Intravenous Q Thu-HD  . feeding supplement (NEPRO CARB STEADY)  237 mL Oral BID BM  . ferric gluconate  (FERRLECIT/NULECIT) IV  125 mg Intravenous Q T,Th,Sa-HD  . lisinopril  20 mg Oral Daily  . multivitamin  1 tablet Oral QHS  . sevelamer carbonate  2,400 mg Oral TID WC  . vancomycin  1,000 mg Intravenous Q T,Th,Sa-HD  . warfarin  7.5 mg Oral q1800  . Warfarin - Pharmacist Dosing Inpatient   Does not apply [phone]

## 2015-04-13 NOTE — Progress Notes (Signed)
Patient ID: Timothy Palmer, male   DOB: [birth year], 49 y.o.   MRN: [id number]       [address]       [place] [address]             [phone]     Asked by Dr Algis Liming to review recent TEE. Patient admited with supescion of septic emboli to the lungs About year ago underwent : Date of Procedure:04/05/2014  Preoperative Diagnosis:  Bacterial Endocarditis  Severe Mitral Regurgitation  Acute Combined Systolic and Diastolic Congestive Heart Failure  Septic Embolization to Left Lower Extremity, Spleen and Small Intestine  Postoperative Diagnosis:Same  Procedure:   Minimally-Invasive Mitral Valve Repair Complex valvuloplasty including autologous pericardial patch repair of perforation of posterior leaflet Sorin Memo 3D Ring Annuloplasty (size 50mm, catalog # [id number], serial # [id number])   Placement of Left Femoral Arterial Line  Surgeon:[name]. Roxy Manns, [place] shows repair intact one small area on anterior leaflet , question of very small vegetation I do not see tricuspid vegetations No indication for cardiac surgery currently Will let Dr Roxy Manns know of readmission Please call if would like formal consult   I have seen and examined Timothy Palmer and agree with the above assessment  and plan.  Grace Isaac MD Beeper [phone] Office [phone] 04/13/2015 3:55 PM

## 2015-04-13 NOTE — Progress Notes (Addendum)
PROGRESS NOTE    Timothy Palmer [id number] DOB: 08-28-1965 DOA: 04/05/2015 PCP: Louis Meckel, MD  HPI/Brief narrative 49 year old male with history of ESRD, hypertension, MSSA bacteremia with mitral valve endocarditis, Atrial fibrillation, hypertension presented with 2 weeks of worsening cough with yellow-green sputum as well as subjective fevers and chills. The patient had an abnormal chest x-ray in outpatient setting. As result CT of the chest with intravenous contrast was obtained. 04/05/2015 CT of the chest revealed bilateral mediastinal and hilar lymphadenopathy as well as improving bilateral patchy ground glass opacities. There was a new left upper lobe 1.5 cm cavitary nodule. As result, the patient was advised to come to the hospital for further workup.  Notably, the patient had MSSA bacteremia back in May 2015 and August 2015. The patient was admitted in September 2015 during which he underwent mitral valve repair on 04/05/2014 secondary to mitral valve endocarditis from MSSA bacteremia. 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 He subsequently underwent a below the knee left popliteal bypass and embolectomy. The patient completed 6 weeks of cefazolin after his mitral valve replacement. Pt states he last smoked marijuana 2 weeks ago and has remote hx of tobacco for which he only smoked 2 years over 15 yrs ago.   Assessment/Plan:  Cavitary lung nodule -Patient has bilateral mediastinal and hilar lymphadenopathy -Given the patient's clinical history, concern was for septic emboli -Other considerations include atypical mycobacterial infection, malignancy, and fungal infection -Clinical suspicion for MTB is low--consider discontinuation respiratory isolation if okay with ID -Appreciate pulmonary-- Dr. Carles Collet spoke with Dr. Eula Fried not amendable to bronchoscopy-->PET in out pt setting -  sputum for AFB x 3--NEG. Quantiferon TB--negative. Discontinued airborne isolation 10/5. -blood cultures--neg-final report -TEE-  A small mobile vegetation noted on anterior leaflet of the mitral valve which does not appear to be compromising valve currently/consistent with recurrent endocarditis -  D/W Dr. Luvenia Heller 10/5: OK to DC airborne & she will make Abx recommendations-hopefully can use antibiotics that can be given across dialysis. Does not have PICC line. -check ACE--pending -ESR--126  ESRD on HD TTS/hyperkalemia (10/6)  -Appreciate nephrology followup -Dialysis on Tuesday, Thursday, Saturdays . Seen at dialysis on 10/6. - Hyperkalemia to be addressed across HD by nephrology.  Status post mitral valve repair/recurrent mitral valve endocarditis -Status post mitral valve repair--04/05/2014 secondary to endocarditis with history of MSSA bacteremia  -Repeat echocardiogram (TTE)--no vegetation  -  TEE report as below. Now with likely recurrent endocarditis.  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  HTN -continue coreg and lisinopril. Controlled  Anemia in chronic kidney disease -  stable   DVT prophylaxis:  Anticoagulated on Coumadin Code Status:  Full Family Communication:  None at bedside Disposition  Plan:  DC home pending ID input regarding long-term antibiotics.   Consultants:   Nephrology   Cardiology   Infectious disease  Procedures:  Hemodialysis   TEE 10/5:Study Conclusions  - Left ventricle: Normal left ventricular size with mild LV hypertrophy. EF 50% with septal bounce. - Aortic valve: Trileaflet; moderately calcified leaflets. No evidence of vegetation. There was mild stenosis. - Aorta: Normal caliber aorta with mild plaque. - Mitral valve: The mitral valve was status post repair. There was trivial MR. Mean gradient 6 mmHg but pressure half-time 78 msec so doubt significant stenosis. There was a small mobile vegetation on the anterior leaflet of the mitral valve. - Left atrium: The atrium was moderately dilated. No evidence of thrombus in the atrial cavity or appendage. - Right ventricle: The cavity size was normal. Systolic function was normal. - Right atrium: The atrium was mildly dilated. - Atrial septum: No defect or patent foramen ovale was identified. - Tricuspid valve: No evidence of vegetation. - Pulmonic valve: No evidence of vegetation.  Impressions:  - Recurrent mitral valve endocarditis, see description above.  Antibiotics:   IV ceftazidime 1 dose  9/28   IV Zosyn 1 dose 9/28   IV vancomycin 9/28 >   IV cefazolin 9/30 >  Subjective: Patient seen at hemodialysis this morning. Denied complaints.  Objective: Filed Vitals:   04/13/15 0505 04/13/15 0705 04/13/15 0740 04/13/15 0745  BP: 120/80 121/84 124/82 112/74  Pulse: 75 77 71 70  Temp: 98.1 F (36.7 C) 98.1 F (36.7 C)    TempSrc: Oral Oral    Resp: _0 Height:      Weight:  98.8 kg (217 lb 13 oz)    SpO2: 99% 92%      Intake/Output Summary (Last 24 hours) at 04/13/15 0849 Last data filed at 04/13/15 0604  Gross per 24 hour  Intake    680 ml  Output      0 ml  Net    680 ml   Filed Weights   04/11/15 1600 04/11/15 2012 04/13/15 0705  Weight:  99.2 kg (218 lb 11.1 oz) 96.6 kg (212 lb 15.4 oz) 98.8 kg (217 lb 13 oz)     Exam:  General exam:  Pleasant young male lying comfortably supine in bed Respiratory system: Clear. No increased work of breathing. Cardiovascular system: S1 & S2 heard, RRR. No JVD, murmurs, gallops, clicks or pedal edema.  Gastrointestinal system: Abdomen is nondistended, soft and nontender. Normal bowel sounds heard. Central nervous system: Alert and oriented. No focal neurological deficits. Extremities: Symmetric 5 x 5 power.   Data Reviewed: Basic Metabolic Panel:  Recent Labs Lab 04/06/15 0930 04/07/15 0300 04/11/15 0722 04/13/15 0821  NA  --  133* 139 134*  K  --  4.2 3.7 6.0*  CL  --  89* 99* 95*  CO2  --  _1 GLUCOSE  --  85 88 117*  BUN  --  39* 24* 27*  CREATININE  --  14.55* 12.85* 10.76*  CALCIUM  --  9.5 9.2 9.3  PHOS 7.0*  --   --  5.1*   Liver Function Tests:  Recent Labs Lab 04/13/15 0821  ALBUMIN 2.4*   No results for input(s): LIPASE, AMYLASE in the last 168 hours. No results for input(s): AMMONIA in the last 168 hours. CBC:  Recent Labs Lab 04/07/15 0300 04/11/15 0722  WBC 8.8 8.0  HGB 7.9* 8.0*  HCT 23.5* 26.0*  MCV 92.5 94.2  PLT 213 340   Cardiac Enzymes: No results for input(s): CKTOTAL, CKMB, CKMBINDEX, TROPONINI in the last 168 hours. BNP (last 3 results)  No results for input(s): PROBNP in the last 8760 hours. CBG: No results for input(s): GLUCAP in the last 168 hours.  Recent Results (from the past 240 hour(s))  Blood culture (routine x 2)     Status: None   Collection Time: 04/05/15 10:26 PM  Result Value Ref Range Status   Specimen Description BLOOD RIGHT ARM  Final   Special Requests BOTTLES DRAWN AEROBIC AND ANAEROBIC 5CC  Final   Culture NO GROWTH 5 DAYS  Final   Report Status 04/10/2015 FINAL  Final  Culture, blood (single)     Status: None   Collection Time: 04/05/15 10:28 PM  Result Value Ref Range Status   Specimen Description  BLOOD RIGHT FOREARM  Final   Special Requests BOTTLES DRAWN AEROBIC ONLY 10CC  Final   Culture NO GROWTH 5 DAYS  Final   Report Status 04/10/2015 FINAL  Final  Blood culture (routine x 2)     Status: None   Collection Time: 04/05/15 10:30 PM  Result Value Ref Range Status   Specimen Description BLOOD RIGHT WRIST  Final   Special Requests BOTTLES DRAWN AEROBIC AND ANAEROBIC 5CC  Final   Culture NO GROWTH 5 DAYS  Final   Report Status 04/10/2015 FINAL  Final  Culture, sputum-assessment     Status: None   Collection Time: 04/08/15  7:46 AM  Result Value Ref Range Status   Specimen Description SPUTUM  Final   Special Requests NONE  Final   Sputum evaluation   Final    MICROSCOPIC FINDINGS SUGGEST THAT THIS SPECIMEN IS NOT REPRESENTATIVE OF LOWER RESPIRATORY SECRETIONS. PLEASE RECOLLECT. Gram Stain Report Called to,Read Back By and Verified With: K. Johnney Ou AT 0825 ON [id number] BY Rhea Bleacher    Report Status 04/08/2015 FINAL  Final  AFB culture with smear     Status: None (Preliminary result)   Collection Time: 04/08/15  7:46 AM  Result Value Ref Range Status   Specimen Description SPUTUM  Final   Special Requests NONE  Final   Acid Fast Smear   Final    NO ACID FAST BACILLI SEEN Performed at [facility]    Culture   Final    CULTURE WILL BE EXAMINED FOR 6 WEEKS BEFORE ISSUING A FINAL REPORT Performed at [facility]    Report Status PENDING  Incomplete  AFB culture with smear     Status: None (Preliminary result)   Collection Time: 04/09/15 12:42 PM  Result Value Ref Range Status   Specimen Description SPUTUM  Final   Special Requests NONE  Final   Acid Fast Smear   Final    NO ACID FAST BACILLI SEEN Performed at [facility]    Culture   Final    CULTURE WILL BE EXAMINED FOR 6 WEEKS BEFORE ISSUING A FINAL REPORT Performed at [facility]    Report Status PENDING  Incomplete  AFB culture with smear     Status: None (Preliminary result)    Collection Time: 04/10/15 10:15 AM  Result Value Ref Range Status   Specimen Description SPUTUM  Final   Special Requests NONE  Final   Acid Fast Smear   Final    NO ACID FAST BACILLI SEEN Performed at [facility]    Culture   Final    CULTURE WILL BE EXAMINED FOR 6 WEEKS BEFORE ISSUING A FINAL REPORT Performed at [facility]    Report Status PENDING  Incomplete  Studies: No results found.      Scheduled Meds: . amiodarone  200 mg Oral Daily  . calcitRIOL  2.25 mcg Oral Q T,Th,Sat-1800  . carvedilol  6.25 mg Oral BID WC  .  ceFAZolin (ANCEF) IV  2 g Intravenous Q T,Th,Sa-HD  . cinacalcet  90 mg Oral BID WC  . darbepoetin (ARANESP) injection - DIALYSIS  150 mcg Intravenous Q Thu-HD  . feeding supplement (NEPRO CARB STEADY)  237 mL Oral BID BM  . ferric gluconate (FERRLECIT/NULECIT) IV  125 mg Intravenous Q T,Th,Sa-HD  . lisinopril  20 mg Oral Daily  . multivitamin  1 tablet Oral QHS  . sevelamer carbonate  2,400 mg Oral TID WC  . vancomycin  1,000 mg Intravenous Q T,Th,Sa-HD  . warfarin  7.5 mg Oral q1800  . Warfarin - Pharmacist Dosing Inpatient   Does not apply q1800   Continuous Infusions: . sodium chloride      Principal Problem:   Endocarditis Active Problems:   End stage renal disease (HCC)   Hypertension   Chronic diastolic congestive heart failure (HCC)   Paroxysmal atrial fibrillation (HCC)   Cavitary lesion of lung   Mitral valve replaced   Anticoagulant long-term use   Anemia in chronic kidney disease   Coagulopathy (HCC)   Multiple pulmonary nodules   Renal mass    Time spent:  80 minutes    Timothy Sheldon, MD, FACP, FHM. [facility] Pager [phone]  If 7PM-7AM, please contact night-coverage [email or web address] Password [email or web address] 04/13/2015, 8:49 AM    LOS: 8 days

## 2015-04-13 NOTE — Progress Notes (Signed)
[facility] for Infectious Disease    Date of Admission:  04/05/2015      ID: Timothy Palmer is a 49 y.o. male with past hx of MV repair in Sep 2015, now presents for fevers, cough. Found to have cavitary pulmonary lesions concerning for septic emboli. TEE shows small mobile vegetation on the anterior leaflet of MV. Principal Problem:   Endocarditis Active Problems:   End stage renal disease (HCC)   Hypertension   Chronic diastolic congestive heart failure (HCC)   Paroxysmal atrial fibrillation (HCC)   Cavitary lesion of lung   Mitral valve replaced   Anticoagulant long-term use   Anemia in chronic kidney disease   Coagulopathy (HCC)   Multiple pulmonary nodules   Renal mass    Subjective: Afebrile, tolerating HD without difficulty, denies pain and chills or fevers when they access with fistula in left arm.  Underwent TEE yesterday: Normal left ventricular size with mild LV hypertrophy. EF 50% with septal bounce. Normal RV size and systolic function. Moderate LAE with no LAA thrombus. Trivial TR, no vegetation. Mild aortic stenosis, no aortic insufficiency. No aortic valve vegetation noted though the valve was mild to moderately calcified. The mitral valve was status post repair. There was trivial MR. Mean gradient 6 mmHg but pressure half-time around 70 msec so doubt significant stenosis. There was a small mobile vegetation on the anterior leaflet of the mitral valve. No evidence for PFO or ASD. Normal caliber aorta with mild plaque.   Mitral valve vegetation noted => small, mobile. Does not appear to be compromising valve currently. Trivial MR.    Medications:  . amiodarone  200 mg Oral Daily  . calcitRIOL  2.25 mcg Oral Q T,Th,Sat-1800  . carvedilol  6.25 mg Oral BID WC  .  ceFAZolin (ANCEF) IV  2 g Intravenous Q T,Th,Sa-HD  . cinacalcet  90 mg Oral BID WC  . darbepoetin (ARANESP) injection - DIALYSIS  150 mcg Intravenous Q Thu-HD  . feeding supplement  (NEPRO CARB STEADY)  237 mL Oral BID BM  . ferric gluconate (FERRLECIT/NULECIT) IV  125 mg Intravenous Q T,Th,Sa-HD  . lisinopril  20 mg Oral Daily  . multivitamin  1 tablet Oral QHS  . sevelamer carbonate  2,400 mg Oral TID WC  . vancomycin  1,000 mg Intravenous Q T,Th,Sa-HD  . warfarin  7.5 mg Oral q1800  . Warfarin - Pharmacist Dosing Inpatient   Does not apply q1800    Objective: Vital signs in last 24 hours: Temp:  [97.8 F (36.6 C)-98.5 F (36.9 C)] 98.1 F (36.7 C) (10/06 0705) Pulse Rate:  [66-80] 70 (10/06 0745) Resp:  [14-30] 22 (10/06 0745) BP: (85-129)/(61-95) 112/74 mmHg (10/06 0745) SpO2:  [92 %-100 %] 92 % (10/06 0705) Weight:  [217 lb 13 oz (98.8 kg)] 217 lb 13 oz (98.8 kg) (10/06 [id number])  Physical Exam  Constitutional: He is oriented to person, place, and time. He appears well-developed and well-nourished. No distress.  HENT:  Mouth/Throat: Oropharynx is clear and moist. No oropharyngeal exudate.  Cardiovascular: Normal rate, regular rhythm and normal heart sounds. Exam reveals no gallop and no friction rub.  No murmur heard.  Pulmonary/Chest: Effort normal and breath sounds normal. No respiratory distress. He has no wheezes.  Abdominal: Soft. Bowel sounds are normal. He exhibits no distension. There is no tenderness.  Lymphadenopathy:  He has no cervical adenopathy.  Neurological: He is alert and oriented to person, place, and time.  Skin: Skin is warm  and dry. No rash noted. No erythema. Left fore arm wrapped from recent HD Psychiatric: He has a normal mood and affect. His behavior is normal.     Lab Results  Recent Labs  04/11/15 0722  WBC 8.0  HGB 8.0*  HCT 26.0*  NA 139  K 3.7  CL 99*  CO2 29  BUN 24*  CREATININE 12.85*    Microbiology: 9/28 blood cx ngtd Studies/Results: No results found.   Assessment/Plan:  Appears to have recurrent MV endocarditis, thus far small mobile. No compromising his valve. Endocarditis would also explain  pulmonary emboli. Though good to rule out lemierre's syndrome. It is somewhat unusual to find small vegetation when he didn't have concurrent bacteremia. He denies any pain to fistula which makes one think thate there is a secondary infection  - recommend he receives 6 wk of IV antibiotics but patient had 3 blood cx that were NGTD on admit - will do culture negative work up -  Recommend to treat with vancomycin and 2 gm IV cefazolin after each hemodiaylsis - will follow up in the ID clinic  [facility], [facility] Cell: [phone] Pager: [phone]  04/13/2015, 8:24 AM

## 2015-04-14 DIAGNOSIS — I5032 Chronic diastolic (congestive) heart failure: Secondary | ICD-10-CM

## 2015-04-14 DIAGNOSIS — Z992 Dependence on renal dialysis: Secondary | ICD-10-CM

## 2015-04-14 DIAGNOSIS — N186 End stage renal disease: Secondary | ICD-10-CM | POA: Insufficient documentation

## 2015-04-14 LAB — CBC
HCT: 31.2 % — ABNORMAL LOW (ref 39.0–52.0)
Hemoglobin: 10 g/dL — ABNORMAL LOW (ref 13.0–17.0)
MCH: 30.3 pg (ref 26.0–34.0)
MCHC: 32.1 g/dL (ref 30.0–36.0)
MCV: 94.5 fL (ref 78.0–100.0)
PLATELETS: 368 10*3/uL (ref 150–400)
RBC: 3.3 MIL/uL — AB (ref 4.22–5.81)
RDW: 17.8 % — ABNORMAL HIGH (ref 11.5–15.5)
WBC: 8.7 10*3/uL (ref 4.0–10.5)

## 2015-04-14 LAB — BARTONELLA ANTIBODY PANEL
B henselae IgG: NEGATIVE titer
B henselae IgM: NEGATIVE titer
B quintana IgG: NEGATIVE titer

## 2015-04-14 LAB — BARTONELLA ANITBODY PANEL: B QUINTANA IGM: NEGATIVE {titer}

## 2015-04-14 LAB — PROTIME-INR
INR: 2.27 — ABNORMAL HIGH (ref 0.00–1.49)
PROTHROMBIN TIME: 24.8 s — AB (ref 11.6–15.2)

## 2015-04-14 MED ORDER — CEFAZOLIN SODIUM-DEXTROSE 2-3 GM-% IV SOLR: 2.0000 g | INTRAVENOUS | Status: DC

## 2015-04-14 MED ORDER — NEPRO/CARBSTEADY PO LIQD: 237.0000 mL | Freq: Two times a day (BID) | ORAL | Status: DC

## 2015-04-14 MED ORDER — VANCOMYCIN HCL IN DEXTROSE 1-5 GM/200ML-% IV SOLN: 1000.0000 mg | INTRAVENOUS | Status: DC

## 2015-04-14 MED ORDER — CARVEDILOL 6.25 MG PO TABS: 6.2500 mg | ORAL_TABLET | Freq: Two times a day (BID) | ORAL | Status: DC

## 2015-04-14 MED ORDER — SEVELAMER CARBONATE 800 MG PO TABS: 2400.0000 mg | ORAL_TABLET | Freq: Three times a day (TID) | ORAL | Status: DC

## 2015-04-14 MED ORDER — CINACALCET HCL 90 MG PO TABS: 90.0000 mg | ORAL_TABLET | Freq: Two times a day (BID) | ORAL | Status: DC

## 2015-04-14 MED ORDER — RENA-VITE PO TABS: 1.0000 | ORAL_TABLET | Freq: Every day | ORAL | Status: DC

## 2015-04-14 NOTE — Care Management Important Message (Signed)
Important Message  Patient Details  Name: Timothy Palmer MRN: [id number] Date of Birth: [birth year]   Medicare Important Message Given:  Yes-third notification given    Delorse Lek 04/14/2015, 11:22 AM

## 2015-04-14 NOTE — Progress Notes (Signed)
Name: Timothy Palmer MRN: [id number] DOB: 1965-10-28    ADMISSION DATE:  04/05/2015 CONSULTATION DATE:  9/29  REFERRING MD :  [facility]   CHIEF COMPLAINT:  Cavitary lung nodule   BRIEF PATIENT DESCRIPTION: 49yo with hx ESRD, HTN, chronic dCHF with hx mitral valve endocarditis with valve replacement 2015, Afib on coumadin.  Presented 9/28 with 2 weeks hx cough, SOB, night sweats, fever.  Abnormal CXR prompted CT chest which revealed cavitary lung lesion and PCCM consulted  SIGNIFICANT EVENTS  9/28 - Admitted to hospital 10/5 - TEE  STUDIES:  CT CHEST W/ 9/28 - Several (at least 10) new subsolid pulmonary nodules scattered throughout both lungs, largest 1.6 cm in the right upper lobe, including a cavitary 1.5 cm left upper lobe pulmonary nodule.  Progressive calcified pleural plaques in the right pleural space.  Incompletely visualized indeterminate hypodense mass in the left upper quadrant of the abdomen, which appears to arise from the lateral upper left kidney, measures at least 5.1 cm in size and appears new since the 03/24/2014 CT abdomen study.  CT ABDOMEN/PELVIS W/ & W/O 9/29 - 2 complex lesions left kidney without enhancement most consistent with hemorrhagic or infected cysts. Neoplasm unlikely. Small enhancing nodule right kidney.  TEE 10/5 - Small mobile vegetation on anterior leaflet of mitral valve. Mild aortic stenosis.  IJ Duplex 10/6 - No evidence of DVT.  SUBJECTIVE: No nausea, vomiting, or diarrhea. Intermittent coughing persists. Denies any dyspnea.  REVIEW OF SYSTEMS: Patient still having sweats. Denies any subjective fever or chills. Denies any chest pain or pressure.  VITAL SIGNS: Temp:  [97.5 F (36.4 C)-98.6 F (37 C)] 97.5 F (36.4 C) (10/07 0823) Pulse Rate:  [72-111] 76 (10/07 0823) Resp:  [18-20] 18 (10/07 0823) BP: (71-109)/(48-62) 98/56 mmHg (10/07 0823) SpO2:  [98 %-100 %] 100 % (10/07 0823) Weight:  [214 lb 15.2 oz (97.5 kg)] 214 lb 15.2 oz (97.5 kg)  (10/06 2042)  PHYSICAL EXAMINATION: General:  No distress. Patient laying in bed in his room with lights off. Awake & alert. Integument: Warm and dry. No rash on exposed skin.  HEENT:  Poor dentition. Moist membranes. No scleral icterus.  Cardiovascular:  Regular rate. No edema.  Pulmonary: Good aeration & clear to auscultation bilaterally. Normal work of breathing. Speaking in complete sentences. Abdomen: Soft. Normal bowel sounds. Mildly protuberant. Grossly nontender. Musculoskeletal:  Normal bulk and tone. No joint deformity or effusion appreciated.   Recent Labs Lab 04/11/15 0722 04/13/15 0821 04/13/15 0912  NA 139 134* 138  K 3.7 6.0* 3.1*  CL 99* 95* 96*  CO2 29 27  --   BUN 24* 27* 16  CREATININE 12.85* 10.76* 5.70*  GLUCOSE 88 117* 91    Recent Labs Lab 04/11/15 0722 04/13/15 0822 04/13/15 0912 04/13/15 0936 04/14/15 0915  HGB 8.0* 6.0* 10.5* 9.6* 10.0*  HCT 26.0* 19.2* 31.0* 29.4* 31.2*  WBC 8.0 9.3  --   --  8.7  PLT 340 444*  --   --  368   No results found.  MICROBIOLOGY SPUTUM AFB 10/3>>>smear neg/pending SPUTUM AFB 10/2>>>smear neg/pending SPUTUM AFB 10/1>>>smear neg/pending  QUANTIFERON-TB 9/29 - negative HIV - negative  BLOOD CTX 9/28 - negative  ANTIBIOTICS Cefazolin 9/29 >>> Vancomycin 9/29 >>>  SEROLOGIES P-ANCA 10/5 - negative C-ANCA 10/5 - negative MPO - <9 PR3 - <3.5 ACE - <15 ANA 10/5 - negative  ASSESSMENT / PLAN: 49 year old male with history of bacteremia presenting with cavitary lung lesion & mediastinal adenopathy.  Adenopathy has been present for some time going back to September 2015. It's unlikely this mediastinal adenopathy represent sarcoidosis with an undetectable angiotensin-converting enzyme level. Given his negative serology workup I favor septic emboli from the left renal hypodense cysts noted on the CT scan of his abdomen/pelvis previously. Case was discussed with Dr. Algis Liming this morning and I will defer to  infectious diseases on following up of the cysts. Certainly the patient will need to have his cavitary lung nodule and other lung nodules followed as he proceeds on antibiotic therapy. There is no evidence for septic emboli from his jugular veins to suggest Lemierre's syndrome.  1. Cavitary lung lesion & lung nodules:  Suspect infectious embolic phenomenon from infected renal cysts. Plan for repeat CT scan of the chest without contrast in 4-6 weeks to ensure resolution and no evidence of progression. 2. 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 Unlikely to represent sarcoidosis given undetectable angiotensin-converting enzyme level. 3. Mitral valve vegetation: Currently on vancomycin & Ancef. Management per ID. 4. Follow-up: Patient has been scheduled for a follow-up appointment on 10/21 at 11:30 AM in our office with Tammy Parrett.  The patient is currently is being considered for discharge. As I have nothing further to offer at this time we will sign off. Please contact Dr. Titus Mould over the weekend if there are any further issues or concerns.  Sonia Baller Ashok Cordia, M.D. [facility] Pager:  [phone] After 3pm or if no response, call [phone] Pager # [phone] OR # [phone] if no answer

## 2015-04-14 NOTE — Progress Notes (Signed)
Subjective:  No cos  Tolerated hd  yesterday  Objective Vital signs in last 24 hours: Filed Vitals:   04/13/15 2042 04/13/15 2044 04/14/15 0450 04/14/15 0823  BP: 89/48 90/50 96/60  98/56  Pulse: 111  74 76  Temp: 98.3 F (36.8 C)  98 F (36.7 C) 97.5 F (36.4 C)  TempSrc: Oral  Oral Oral  Resp: 20  18 18   Height:      Weight: 97.5 kg (214 lb 15.2 oz)     SpO2: 100%  100% 100%   Weight change:  Physical Exam General:  NAD/ alert and oriented  Heart: RRR 2/6 murmur SEM , no rub or gallop Lungs: CTA, unlabored. Abdomen: soft, nontender / ND/ +BS Extremities: no Pedal edema Dialysis Access: L AVf faint bruit  Dialysis Orders: [facility], 4hr, BFR 500 with 14g needles, DW 103kg, 2K 2 Ca, No Na modeling, micera156mcg q 2 weeks, calcitriol 2.23mcg TTS  Problem/Plan:  1. Cavitarypulm lesion with other pulmonary nodules - ID seeing. Has small mobile vegetation on the MV, this is the same valve operated on/repaired last year by cardiac surgery.  Blood cx's neg and no fevers, but has been having nights sweats for almost a month. Treating as recurrent endocarditis. 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 Plan 6 wk empiric vanc/ ancef w HD for now. Pulm lesions felt likely to be embolic/ infectious as well.  2. ESRD -TTS - HD  tomorrow Sat- will have lower EDW at d/c  3. Anemia Fe deficiency- Hgb 9.6 , stable- last Mircera 150 9/20 - given Aranesp 150 9/30 tsat 8% total Fe only 14 with ferritin 1337 - replete short course Fe- on HD  4. Secondary hyperparathyroidism - uncontrolled iPTH 1470 when last checked - continue calcitriol, binders/sensipar- phos 7>5.1 in hosp.  5. HTN/volume - BP controlled in hosp to  lowish  on coreg and lisinopril. Lower edw at DC 6. Nutrition - losing weight alb 2.4;- add supplements 7. Renal mass -evaluation by Dr. Jeffie Pollock suggest he has probably hemoorhage into one or more of his acquired renal cysts. Rec ReCT in 3-4 weeks 8. 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 Coagulopathy- INR 2.16 10. PAF-on amio and coumadin  Ernest Haber, PA-C [name] Beeper [phone] 04/14/2015,9:44 AM  LOS: 9 days   Pt seen, examined, agree w assess/plan as above with additions as indicated.  Kelly Splinter MD pager [phone]    cell [phone] 04/14/2015, 12:25 PM     Labs: Basic Metabolic Panel:  Recent Labs Lab 04/11/15 0722 04/13/15 0821 04/13/15 0912  NA 139 134* 138  K 3.7 6.0* 3.1*  CL 99* 95* 96*  CO2 29 27  --   GLUCOSE 88 117* 91  BUN 24* 27* 16  CREATININE 12.85* 10.76* 5.70*  CALCIUM 9.2 9.3  --   PHOS  --  5.1*  --    Liver Function Tests:  Recent Labs Lab 04/13/15 0821  ALBUMIN 2.4*  CBC:  Recent Labs Lab 04/11/15 0722 04/13/15 0822 04/13/15 0912 04/13/15 0936  WBC 8.0 9.3  --   --   HGB 8.0* 6.0* 10.5* 9.6*  HCT 26.0* 19.2* 31.0* 29.4*  MCV 94.2 93.2  --   --   PLT 340 444*  --   --      Medications: . sodium chloride     . amiodarone  200 mg Oral Daily  . calcitRIOL  2.25 mcg Oral Q T,Th,Sat-1800  . carvedilol  6.25 mg Oral BID WC  .  ceFAZolin (ANCEF) IV  2 g Intravenous Q T,Th,Sa-HD  . cinacalcet  90 mg Oral BID WC  . darbepoetin (ARANESP) injection - DIALYSIS  150 mcg Intravenous Q Thu-HD  . feeding supplement (NEPRO CARB STEADY)  237 mL Oral BID BM  . ferric gluconate (FERRLECIT/NULECIT) IV  125 mg Intravenous Q T,Th,Sa-HD  . lisinopril  20 mg Oral Daily  . multivitamin  1 tablet Oral QHS  . sevelamer carbonate  2,400 mg Oral TID WC  . vancomycin  1,000 mg Intravenous Q T,Th,Sa-HD  . warfarin  7.5 mg Oral q1800  . Warfarin - Pharmacist Dosing Inpatient   Does not apply [phone]

## 2015-04-14 NOTE — Discharge Summary (Signed)
Physician Discharge Summary  Timothy Palmer [id number] DOB: [birth year] DOA: 04/05/2015  PCP: Louis Meckel, MD  Admit date: 04/05/2015 Discharge date: 04/14/2015  Time spent: Greater than 30 minutes  Recommendations for Outpatient Follow-up:  1. Home Health RN 2. Dr. Irine Seal, Urology in 4-6 weeks 3. Dr. Corliss Parish, PCP 4. Tammy Parrett, NP/pulmonology on 04/28/15 at 11:30 AM 5. Hemodialysis center-keep up regular dialysis appointments 6. Home health RN 7. Infectious disease: MDs office will arrange follow-up.   Discharge Diagnoses:  Principal Problem:   Endocarditis Active Problems:   End stage renal disease (HCC)   Hypertension   Chronic diastolic congestive heart failure (HCC)   Paroxysmal atrial fibrillation (HCC)   Cavitary lesion of lung   Mitral valve replaced   Anticoagulant long-term use   Anemia in chronic kidney disease   Coagulopathy (HCC)   Multiple pulmonary nodules   Renal mass   Mediastinal adenopathy   Discharge Condition: Improved & Stable  Diet recommendation: Heart healthy diet.  Filed Weights   04/13/15 0705 04/13/15 1110 04/13/15 2042  Weight: 98.8 kg (217 lb 13 oz) 96 kg (211 lb 10.3 oz) 97.5 kg (214 lb 15.2 oz)    History of present illness:  49 year old male with history of ESRD, hypertension, MSSA bacteremia with mitral valve endocarditis, Atrial fibrillation, hypertension presented with 2 weeks of worsening cough with yellow-green sputum as well as subjective fevers and chills. The patient had an abnormal chest x-ray in outpatient setting. As result CT of the chest with intravenous contrast was obtained. 04/05/2015 CT of the chest revealed bilateral mediastinal and hilar lymphadenopathy as well as improving bilateral patchy ground glass opacities. There was a new left upper lobe 1.5 cm cavitary nodule. As result, the patient was advised to come to the hospital for further workup.  Notably, the patient had MSSA bacteremia  back in May 2015 and August 2015. The patient was admitted in September 2015 during which he underwent mitral valve repair on 04/05/2014 secondary to mitral valve endocarditis from MSSA bacteremia. 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 He subsequently underwent a below the knee left popliteal bypass and embolectomy. The patient completed 6 weeks of cefazolin after his mitral valve replacement. Pt states he last smoked marijuana 2 weeks ago and has remote hx of tobacco for which he only smoked 2 years over 15 yrs ago.  Hospital Course:   Cavitary LUL lung nodule with multiple pulmonary nodules/Mediastinal Adenopathy -Patient has bilateral mediastinal and hilar lymphadenopathy - After extensive workup, cavitary lung nodule felt to be from infectious embolic phenomenon from infected renal cysts. -Appreciate pulmonary-- Dr. Carles Collet spoke with Dr. Eula Fried not amendable to bronchoscopy-->PET in out pt setting - sputum for AFB x 3--NEG. Quantiferon TB--negative. Discontinued airborne isolation 10/5. -blood cultures--neg-final report -TEE- A small mobile vegetation noted on anterior leaflet of the mitral valve which does not appear to be compromising valve currently/consistent with recurrent endocarditis - As per infectious disease, recommend IV vancomycin and Ancef across dialysis for 6 weeks. Discussed with Dr.Snider- will arrange outpatient follow-up. - As per pulmonology, adenopathy has been present for some time going back to September 2015 and it's unlikely this mediastinal adenopathy represents sarcoidosis with an undetectable ACE level. Given his negative serology workup, they favor septic emboli from left renal hypodense cysts noted on CT of abdomen and pelvis. No evidence of septic emboli from his jugular veins to suggest Lemierre's syndrome.  Patient will follow-up with urology regarding renal cysts. - We will  need repeat CT of chest without contrast in 4-6 weeks to insure resolution and no evidence of progression. Pulmonology will follow.   ESRD on HD TTS/hyperkalemia (10/6)  -Appreciate nephrology followup - Dialysis on Tuesday, Thursday, Saturdays - Hyperkalemia resolved.   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 Now with likely recurrent endocarditis. - TEE- A small mobile vegetation noted on anterior leaflet of the mitral valve which does not appear to be compromising valve currently/consistent with recurrent endocarditis - As per infectious disease, recommend IV vancomycin and Ancef across dialysis for 6 weeks. Discussed with Dr.Snider- will arrange outpatient follow-up. - Discussed with her to thoracic surgery, no surgical indication at this time. - Source of infection may be infected renal cyst. ID performing culture negative workup.  Coagulopathy  -INR >10 -vitamin K x 1-->INR 2.1-->1.63 -likely due to acute infection -check fibrinogen, peripheral smear--not consistent with DIC -Restarted coumadin  - INR therapeutic in the 2 range since 04/11/15. Patient had been receiving Coumadin 7.5 MG daily. Advised him to continue to take 7.5 MG daily until outpatient follow-up with Coumadin clinic.   Renal Mass - CT abd/pelvis with renal mass protocol--large left complex lesion with perinephric stranding--Hemorrhagic cyst - appreciate urology opinion--repeat CT in 2-3 weeks  Paroxysmal atrial fibrillation  -This occurred postoperatively  -presently in sinus -Restarted warfarin -Continue amiodarone and carvedilol  HTN -continue coreg and lisinopril. Controlled  Anemia in chronic kidney disease/iron deficiency - stable - Received Aranesp and IV iron across  HD  Secondary hyperparathyroidism - Management per nephrology.   Consultants:  Nephrology  Cardiology  Infectious disease  Urology   Pulmonology  Procedures:  Hemodialysis  TEE 10/5:Study Conclusions  - Left ventricle: Normal left ventricular size with mild LV hypertrophy. EF 50% with septal bounce. - Aortic valve: Trileaflet; moderately calcified leaflets. No evidence of vegetation. There was mild stenosis. - Aorta: Normal caliber aorta with mild plaque. - Mitral valve: The mitral valve was status post repair. There was trivial MR. Mean gradient 6 mmHg but pressure half-time 78 msec so doubt significant stenosis. There was a small mobile vegetation on the anterior leaflet of the mitral valve. - Left atrium: The atrium was moderately dilated. No evidence of thrombus in the atrial cavity or appendage. - Right ventricle: The cavity size was normal. Systolic function was normal. - Right atrium: The atrium was mildly dilated. - Atrial septum: No defect or patent foramen ovale was identified. - Tricuspid valve: No evidence of vegetation. - Pulmonic valve: No evidence of vegetation.  Impressions:  - Recurrent mitral valve endocarditis, see description above.  Antibiotics:  IV ceftazidime 1 dose 9/28  IV Zosyn 1 dose 9/28  IV vancomycin 9/28 >  IV cefazolin 9/30 >  Discharge Exam:  Complaints: Denied complaints.  Filed Vitals:   04/13/15 2044 04/14/15 0450 04/14/15 0823 04/14/15 1000  BP: 90/50 96/60 98/56  96/62  Pulse:  74 76 71  Temp:  98 F (36.7 C) 97.5 F (36.4 C) 98.5 F (36.9 C)  TempSrc:  Oral Oral Oral  Resp:  18 18 18   Height:      Weight:      SpO2:  100% 100% 98%    General exam: Pleasant young male lying comfortably supine in bed Respiratory system: Clear. No  increased work of breathing. Cardiovascular system: S1 & S2 heard, RRR. No JVD, murmurs, gallops, clicks or pedal edema.  Gastrointestinal system: Abdomen  is nondistended, soft and nontender. Normal bowel sounds heard. Central nervous system: Alert and oriented. No focal neurological deficits. Extremities: Symmetric 5 x 5 power.  Discharge Instructions      Discharge Instructions    (HEART FAILURE PATIENTS) Call MD:  Anytime you have any of the following symptoms: 1) 3 pound weight gain in 24 hours or 5 pounds in 1 week 2) shortness of breath, with or without a dry hacking cough 3) swelling in the hands, feet or stomach 4) if you have to sleep on extra pillows at night in order to breathe.    Complete by:  As directed      Call MD for:  difficulty breathing, headache or visual disturbances    Complete by:  As directed      Call MD for:  extreme fatigue    Complete by:  As directed      Call MD for:  hives    Complete by:  As directed      Call MD for:  persistant dizziness or light-headedness    Complete by:  As directed      Call MD for:  persistant nausea and vomiting    Complete by:  As directed      Call MD for:  redness, tenderness, or signs of infection (pain, swelling, redness, odor or green/yellow discharge around incision site)    Complete by:  As directed      Call MD for:  severe uncontrolled pain    Complete by:  As directed      Call MD for:  temperature >100.4    Complete by:  As directed      Diet - low sodium heart healthy    Complete by:  As directed      Increase activity slowly    Complete by:  As directed             Medication List    STOP taking these medications        darbepoetin 100 MCG/0.5ML Soln injection  Commonly known as:  ARANESP     HYDROcodone-acetaminophen 5-325 MG tablet  Commonly known as:  NORCO/VICODIN      TAKE these medications        amiodarone 200 MG tablet  Commonly known as:  PACERONE  Take 200 mg by mouth daily.     carvedilol 6.25 MG tablet  Commonly known as:  COREG  Take 1 tablet (6.25 mg total) by mouth 2 (two) times daily with a meal.     ceFAZolin 2-3 GM-% Solr   Commonly known as:  ANCEF  Inject 50 mLs (2 g total) into the vein Every Tuesday,Thursday,and Saturday with dialysis. Duration: 6 weeks with start date 04/07/2015.     cinacalcet 90 MG tablet  Commonly known as:  SENSIPAR  Take 1 tablet (90 mg total) by mouth 2 (two) times daily.     feeding supplement (NEPRO CARB STEADY) Liqd  Take 237 mLs by mouth 2 (two) times daily between meals.     lisinopril 20 MG tablet  Commonly known as:  PRINIVIL,ZESTRIL  Take 20 mg by mouth daily.     multivitamin Tabs tablet  Take 1 tablet by mouth at bedtime.     sevelamer carbonate 800 MG tablet  Commonly known as:  RENVELA  Take 3 tablets (2,400 mg total)  by mouth 3 (three) times daily with meals.     vancomycin 1 GM/200ML Soln  Commonly known as:  VANCOCIN  Inject 200 mLs (1,000 mg total) into the vein Every Tuesday,Thursday,and Saturday with dialysis. Duration: 6 weeks with start date 04/07/2015.     warfarin 5 MG tablet  Commonly known as:  COUMADIN  Take as directed by Coumadin Clinic       Follow-up Information    Follow up with Malka So, MD.   Specialty:  Urology   Why:  Call for a follow up appt to be seen in 4-6 weeks.    Contact information:   [address] [place] [address] [phone]       Schedule an appointment as soon as possible for a visit with Louis Meckel, MD.   Specialty:  Nephrology   Contact information:   [place] [address] [phone]       Follow up with Hemodialysis Center.   Why:  Keep regular dialysis appointments        The results of significant diagnostics from this hospitalization (including imaging, microbiology, ancillary and laboratory) are listed below for reference.    Significant Diagnostic Studies: Ct Abdomen Pelvis W Wo Contrast  04/06/2015   CLINICAL DATA:  49 year old hemodialysis patient with left upper quadrant abdominal mass on chest CT, incompletely visualized. Chest CT performed for cough, weakness  and anorexia.  EXAM: CT ABDOMEN AND PELVIS WITHOUT AND WITH CONTRAST  TECHNIQUE: Multidetector CT imaging of the abdomen and pelvis was performed following the standard protocol before and following the bolus administration of intravenous contrast.  CONTRAST:  167mL OMNIPAQUE IOHEXOL 300 MG/ML  SOLN  COMPARISON:  Chest CT 04/05/2015.  Abdominal CTA 03/24/2014.  FINDINGS: Lower chest: The visualized lung bases are stable from yesterday's chest CT. There is pleural thickening and calcified pleural plaque formation on the right. Prosthetic mitral valve noted.  Hepatobiliary: The liver is normal in density without focal abnormality. There are multiple calcified gallstones. The gallbladder is contracted with mild wall thickening, but no surrounding inflammation. There is no gallbladder wall thickening or evidence of choledocholithiasis.  Pancreas: Unremarkable. No pancreatic ductal dilatation or surrounding inflammatory changes.  Spleen: Normal in size without focal abnormality.  Adrenals/Urinary Tract: Both adrenal glands appear normal. Both kidneys are enlarged by multiple cystic lesions consistent with adult polycystic kidney disease. On the right, a few these demonstrate thin peripheral calcifications. There is an area of ill-defined enhancement medially in the lower pole of the right kidney, measuring approximately 11 mm on image 89 of series 7. This enhances from 48 HU prior to contrast to 168 HU on the immediate postcontrast images. No other suspicious findings are present on the right. The left kidney demonstrates 2 complex lesions and diffuse perinephric soft tissue stranding. In the upper interpolar region, there is a 5.7 x 6.8 cm lesion on image 63 of series 2 which does not show any significant enhancement, measuring 39 HU prior to contrast, 39 HU on the immediate postcontrast images and 39 HU on the delayed images. In the lower pole, there is a large irregular lesion measuring 10.2 x 6.9 cm on image 84 of  series 2. This does not show significant enhancement either, measuring 46 HU, 51 HU and 56 HU on the 3 phases. There is no definite enhancing renal lesion or hydronephrosis. There is no contrast excretion following contrast. The bladder demonstrates intermediate density within its lumen, but no apparent focal abnormality.  Stomach/Bowel: No  evidence of bowel wall thickening, distention or surrounding inflammatory change.  Vascular/Lymphatic: There are no enlarged abdominal or pelvic lymph nodes. Mild aortoiliac atherosclerosis appears unchanged.  Reproductive: There is mild enlargement of the prostate gland which demonstrates central dystrophic calcifications. There are prominent calcifications of the vas deferens.  Other: Small umbilical hernia containing fat.  Musculoskeletal: No acute or significant osseous findings. Degenerative disc disease noted at the lumbosacral junction.  IMPRESSION: 1. There are 2 large complex lesions involving the interpolar and lower pole regions of the left kidney with associated perinephric soft tissue stranding. Neither lesion demonstrates significant enhancement. Findings are most consistent with hemorrhagic or infected cysts. Neoplasm is unlikely given the lack of significant enhancement. 2. There is a small enhancing nodule inferomedially in the right kidney which is not well-defined, although could reflect a small tumor or vascular lesion. 3. No evidence of obstructing calculus. 4. Cholelithiasis. 5. If there is clinical concern of an infected renal cyst, cyst aspiration under ultrasound could be attempted. Otherwise, management options include CT or MR follow-up of the bilateral lesions.   Electronically Signed   By: Richardean Sale M.D.   On: 04/06/2015 21:57   Dg Chest 2 View  03/19/2015   CLINICAL DATA:  Chest pain  EXAM: CHEST  2 VIEW  COMPARISON:  05/30/2014  FINDINGS: Stable mild cardiomegaly and vascular pedicle widening in this patient post mitral valve repair.   Pleural and parenchymal scarring on the right with volume loss which is presumably postoperative.  Subtle small density in the peripheral left upper chest near the posterior fifth rib is likely summation shadows with the scapula. There is no edema, consolidation, effusion, or pneumothorax.  No acute osseous finding to explain chest pain.  IMPRESSION: 1. No active cardiopulmonary disease. 2. Stable pleural parenchymal scarring on the right.   Electronically Signed   By: Monte Fantasia M.D.   On: 03/19/2015 17:58   Ct Chest W Contrast  04/05/2015   CLINICAL DATA:  Cough. Weakness. Anorexia for 2.5 weeks. Prior mitral valve replacement.  EXAM: CT CHEST WITH CONTRAST  TECHNIQUE: Multidetector CT imaging of the chest was performed during intravenous contrast administration.  CONTRAST:  98mL ISOVUE-300 IOPAMIDOL (ISOVUE-300) INJECTION 61%  COMPARISON:  03/22/2014 chest CT angiogram. 03/19/2015 chest radiograph.  FINDINGS: Mediastinum/Nodes: Mitral valve prosthesis appears well positioned. Mild cardiomegaly, decreased. No pericardial fluid/thickening. There is atherosclerosis of the thoracic aorta, the great vessels of the mediastinum and the coronary arteries, including calcified atherosclerotic plaque in the left anterior descending, left circumflex and right coronary arteries. Great vessels are normal in course and caliber. No central pulmonary emboli. There is a partially calcified 1.5 cm hypodense posterior left thyroid lobe nodule, not appreciably changed. Normal esophagus. No axillary lymphadenopathy. Stable mildly enlarged 1.4 cm upper right paratracheal node (series 3/image 11). Stable mildly enlarged 1.5 cm subcarinal node (3/32). Stable aortopulmonary window lymphadenopathy, with the largest node measuring 1.9 cm (3/22), stable. There are mildly enlarged hilar nodes bilaterally, including a 1.5 cm right hilar node (3/20) and a 1.1 cm left hilar node (3/29), stable.  Lungs/Pleura: No pneumothorax. There are  calcified pleural plaques in the mid and basilar right pleural space with trace right pleural effusion/pleural thickening, decreased. One of the right hemidiaphragmatic calcified pleural plaques is new (series 4/image 39) . No left pleural effusion. There is patchy ground-glass opacity throughout both lungs, slightly less prominent in the interval. There is a 1.5 cm new cavitary nodule in the peripheral left upper lobe (series 4/image 18).  There is a new subsolid 1.6 x 1.4 cm medial right upper lobe pulmonary nodule (4/22). There are several additional sub cm sub solid new pulmonary nodules throughout both lungs (at least 10).  Upper abdomen: Multiple subcentimeter calcified gallstones are seen layering in the nondistended non thick walled gallbladder. No biliary ductal dilatation. Partially visualized is a polycystic left kidney. There is an indeterminate incompletely characterized and partially visualized exophytic hypodense mass in the lateral upper left kidney measuring at least 5.1 x 5.0 cm (series 3/image 66), which was not apparent on the 03/24/2014 CT abdomen study.  Musculoskeletal: No aggressive appearing focal osseous lesions. There is bilateral gynecomastia, asymmetric to the right, increased in the interval. Mild degenerative changes in the thoracic spine.  IMPRESSION: 1. Several (at least 10) new subsolid pulmonary nodules scattered throughout both lungs, largest 1.6 cm in the right upper lobe, including a cavitary 1.5 cm left upper lobe pulmonary nodule. Differential considerations include septic pulmonary emboli, fungal pneumonia and less likely pulmonary metastases or tuberculosis. Consider short-term follow-up post treatment chest CT in 4-6 weeks. 2. Mild cardiomegaly with patchy ground-glass opacity throughout both lungs, both which are decreased compared to the 03/22/2014 chest CT study, favor improved mild pulmonary edema. 3. Progressive calcified pleural plaques in the right pleural space.  Trace right pleural effusion/thickening, decreased. 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 4. Stable nonspecific mediastinal and bilateral hilar lymphadenopathy. 5. Incompletely visualized indeterminate hypodense mass in the left upper quadrant of the abdomen, which appears to arise from the lateral upper left kidney, measures at least 5.1 cm in size and appears new since the 03/24/2014 CT abdomen study. Recommend further evaluation with renal mass protocol CT or MRI of the abdomen with and without intravenous contrast. 6. Atherosclerosis, including three-vessel coronary artery disease. Please note that although the presence of coronary artery calcium documents the presence of coronary artery disease, the severity of this disease and any potential stenosis cannot be assessed on this non-gated CT examination. Assessment for potential risk factor modification, dietary therapy or pharmacologic therapy may be warranted, if clinically indicated. 7. Cholelithiasis. These results were called by telephone at the time of interpretation on 04/05/2015 at 5:42 pm to Dr. Posey Pronto, who verbally acknowledged these results.   Electronically Signed   By: Ilona Sorrel M.D.   On: 04/05/2015 17:42    Microbiology: Recent Results (from the past 240 hour(s))  Blood culture (routine x 2)     Status: None   Collection Time: 04/05/15 10:26 PM  Result Value Ref Range Status   Specimen Description BLOOD RIGHT ARM  Final   Special Requests BOTTLES DRAWN AEROBIC AND ANAEROBIC 5CC  Final   Culture NO GROWTH 5 DAYS  Final   Report Status 04/10/2015 FINAL  Final  Culture, blood (single)     Status: None   Collection Time: 04/05/15 10:28 PM  Result Value Ref Range Status   Specimen Description BLOOD RIGHT FOREARM  Final   Special Requests BOTTLES DRAWN AEROBIC ONLY 10CC  Final   Culture NO GROWTH 5 DAYS  Final   Report Status 04/10/2015 FINAL  Final  Blood culture (routine x 2)     Status: None    Collection Time: 04/05/15 10:30 PM  Result Value Ref Range Status   Specimen Description BLOOD RIGHT WRIST  Final   Special Requests BOTTLES DRAWN AEROBIC AND ANAEROBIC 5CC  Final   Culture NO GROWTH 5 DAYS  Final   Report Status 04/10/2015 FINAL  Final  Culture, sputum-assessment     Status: None   Collection Time: 04/08/15  7:46 AM  Result Value Ref Range Status   Specimen Description SPUTUM  Final   Special Requests NONE  Final   Sputum evaluation   Final    MICROSCOPIC FINDINGS SUGGEST THAT THIS SPECIMEN IS NOT REPRESENTATIVE OF LOWER RESPIRATORY SECRETIONS. PLEASE RECOLLECT. Gram Stain Report Called to,Read Back By and Verified With: K. Johnney Ou AT 0825 ON [id number] BY Rhea Bleacher    Report Status 04/08/2015 FINAL  Final  AFB culture with smear     Status: None (Preliminary result)   Collection Time: 04/08/15  7:46 AM  Result Value Ref Range Status   Specimen Description SPUTUM  Final   Special Requests NONE  Final   Acid Fast Smear   Final    NO ACID FAST BACILLI SEEN Performed at [facility]    Culture   Final    CULTURE WILL BE EXAMINED FOR 6 WEEKS BEFORE ISSUING A FINAL REPORT Performed at [facility]    Report Status PENDING  Incomplete  AFB culture with smear     Status: None (Preliminary result)   Collection Time: 04/09/15 12:42 PM  Result Value Ref Range Status   Specimen Description SPUTUM  Final   Special Requests NONE  Final   Acid Fast Smear   Final    NO ACID FAST BACILLI SEEN Performed at [facility]    Culture   Final    CULTURE WILL BE EXAMINED FOR 6 WEEKS BEFORE ISSUING A FINAL REPORT Performed at [facility]    Report Status PENDING  Incomplete  AFB culture with smear     Status: None (Preliminary result)   Collection Time: 04/10/15 10:15 AM  Result Value Ref Range Status   Specimen Description SPUTUM  Final   Special Requests NONE  Final   Acid Fast Smear   Final    NO ACID FAST BACILLI SEEN Performed at  [facility]    Culture   Final    CULTURE WILL BE EXAMINED FOR 6 WEEKS BEFORE ISSUING A FINAL REPORT Performed at [facility]    Report Status PENDING  Incomplete     Labs: Basic Metabolic Panel:  Recent Labs Lab 04/11/15 0722 04/13/15 0821 04/13/15 0912  NA 139 134* 138  K 3.7 6.0* 3.1*  CL 99* 95* 96*  CO2 29 27  --   GLUCOSE 88 117* 91  BUN 24* 27* 16  CREATININE 12.85* 10.76* 5.70*  CALCIUM 9.2 9.3  --   PHOS  --  5.1*  --    Liver Function Tests:  Recent Labs Lab 04/13/15 0821  ALBUMIN 2.4*   No results for input(s): LIPASE, AMYLASE in the last 168 hours. No results for input(s): AMMONIA in the last 168 hours. CBC:  Recent Labs Lab 04/11/15 0722 04/13/15 0822 04/13/15 0912 04/13/15 0936 04/14/15 0915  WBC 8.0 9.3  --   --  8.7  HGB 8.0* 6.0* 10.5* 9.6* 10.0*  HCT 26.0* 19.2* 31.0* 29.4* 31.2*  MCV 94.2 93.2  --   --  94.5  PLT 340 444*  --   --  368   Cardiac Enzymes: No results for input(s): CKTOTAL, CKMB, CKMBINDEX, TROPONINI in the last 168 hours. BNP: BNP (last 3 results)  Recent Labs  04/05/15 2051  BNP 1562.8*    ProBNP (last 3 results) No results for input(s): PROBNP in the last 8760 hours.  CBG: No  results for input(s): GLUCAP in the last 168 hours.    Signed:  Vernell Leep, MD, FACP, FHM. [facility] Pager [phone]  If 7PM-7AM, please contact night-coverage [email or web address] Password [email or web address] 04/14/2015, 1:09 PM

## 2015-04-14 NOTE — Progress Notes (Signed)
Patient Discharge:  Disposition: Pt discharged home  Education: Pt educated on follow up appointments, medications, and all discharge instructions. Pt verbalized understanding.  IV: Removed  Telemetry: Removed. CCMD notified  Follow-up appointments: Reviewed with pt  Prescriptions: Scripts sent to pt's pharmacy  Transportation: Transported home by family  Belongings: All belongings taken with pt.

## 2015-04-14 NOTE — Care Management Note (Signed)
Case Management Note  Patient Details  Name: DONTARIOUS SCHAUM MRN: [id number] Date of Birth: [birth year]  Subjective/Objective:          CM following this pt since adm for progression and d/c planning.          Action/Plan: 04/14/2015 Met with pt re possible HHRN needs for eval and assessment post d/c due to extensive illness and hx. Discussed with pt who states that he does not feel that he needs this followup.   Expected Discharge Date:     04/14/2015             Expected Discharge Plan:  Home/Self Care  In-House Referral:  NA  Discharge planning Services  CM Consult  Post Acute Care Choice:  NA Choice offered to:  NA  DME Arranged:  N/A DME Agency:  NA  HH Arranged:  NA HH Agency:  NA  Status of Service:  Completed, signed off  Medicare Important Message Given:  Yes-third notification given Date Medicare IM Given:    Medicare IM give by:    Date Additional Medicare IM Given:    Additional Medicare Important Message give by:     If discussed at [place] of Stay Meetings, dates discussed:    Additional Comments:  Adron Bene, RN 04/14/2015, 1:07 PM

## 2015-04-14 NOTE — Progress Notes (Signed)
ANTICOAGULATION & ANTIBIOTIC CONSULT NOTE - Follow Up Consult  Pharmacy Consult for Warfarin & Vancomycin + Cefazolin Indication: Hx Afib & Culture Negative Endocarditis  No Known Allergies  Patient Measurements: Height: 5\' 11"  (180.3 cm) Weight: 214 lb 15.2 oz (97.5 kg) IBW/kg (Calculated) : 75.3   Vital Signs: Temp: 98.5 F (36.9 C) (10/07 1000) Temp Source: Oral (10/07 1000) BP: 96/62 mmHg (10/07 1000) Pulse Rate: 71 (10/07 1000)  Labs:  Recent Labs  04/12/15 0441 04/13/15 0555 04/13/15 0821  04/13/15 0822 04/13/15 0912 04/13/15 0936 04/14/15 0537 04/14/15 0915  HGB  --   --   --   < > 6.0* 10.5* 9.6*  --  10.0*  HCT  --   --   --   < > 19.2* 31.0* 29.4*  --  31.2*  PLT  --   --   --   --  444*  --   --   --  368  LABPROT 23.9* 23.8*  --   --   --   --   --  24.8*  --   INR 2.16* 2.15*  --   --   --   --   --  2.27*  --   CREATININE  --   --  10.76*  --   --  5.70*  --   --   --   < > = values in this interval not displayed.  Estimated Creatinine Clearance: 19.1 mL/min (by C-G formula based on Cr of 5.7).   Assessment: 89 YOM with complicated PMH including ESRD and prior MSSA IE s/p MVR who presented on 9/29 after CT showed a cavitary lesion concerning for septic emboli vs recurrent endocarditis. TEE showed mitral valve vegetation, CVTS consulted and are not planning surgery for now.   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 ID planning to treat for 6 weeks. A pre-HD Vancomycin level on 10/6 was very close to goal (VR 26 mcg/ml, goal of 15-25 mcg/ml). Current doses of Vancomycin + Cefazolin remain appropriate. HD schedule is TThSat  The patient also continues on warfarin resumed from PTA for hx Afib. INR today remains therapeutic (INR 2.27 << 2.15, goal of 2-3). Hgb/Hct/Plt okay - no overt s/sx of bleeding noted.   Goal of Therapy:  INR 2-3  Pre-HD Vancomycin level of 15-25 mcg/ml Proper antibiotics for  infection/cultures adjusted for renal/hepatic function    Plan:  1. Continue Warfarin 7.5 mg daily for now 2. Continue Vancomycin 1g/HD-TTS 3. Continue Cefazolin 2g/HD-TTS 4. Will continue to monitor for any signs/symptoms of bleeding and will follow up with PT/INR in the a.m. 5. Will continue to follow HD schedule/duration, culture results, LOT, and antibiotic de-escalation plans   Alycia Rossetti, PharmD, BCPS Clinical Pharmacist Pager: [phone] 04/14/2015 2:08 PM

## 2015-04-14 NOTE — Discharge Instructions (Signed)
Endocarditis Endocarditis is an infection of the inner layer of the heart (endocardium) or of the heart valves. Endocarditis can cause growths inside the heart or on the heart valves. These growths can destroy heart tissue and cause heart failure over time. They can also cause stroke if they break away and form a blood clot in the brain. CAUSES  Endocarditis is caused by germs that normally live in or on your body. The germs that most commonly cause endocarditis are bacteria, but fungi can also cause endocarditis. RISK FACTORS Risk factors include:  Having a heart defect.  Having artificial (prosthetic) heart valves.  Having an abnormal or damaged heart valve.  Having a history of endocarditis.  Having had a heart transplant. SIGNS AND SYMPTOMS Signs and symptoms may start suddenly, or they may start slowly and gradually get worse. Symptoms include:  Fever.  Chills.  Night sweats.  Muscle aches.  Fatigue.  Weakness.  Shortness of breath. Signs include:  An abnormal heart sound (murmur).  Retinal bleeding.  Bleeding under the nails of your fingers or toes.  Painless red spots on your palms.  Painful lumps in your fingertips or toes.  Swelling in your feet or ankles. DIAGNOSIS  To make a diagnosis, your health care provider may:  Perform a physical exam. During the exam he or she will listen to your heart to check for a murmur. He or she may also use a scope to check for bleeding in your retinas.  Order tests. They may include:  Blood tests to look for the germs that cause endocarditis.  An echocardiogram to create an image of your heart. TREATMENT Early treatment offers the best chance for curing endocarditis and preventing complications. Treatment depends on the cause of the endocarditis. Treatment may include:  Antibiotic medicines. These may be given through an IV tube or taken orally.  Surgery to replace your heart valve. You may need surgery if:  The  endocarditis does not respond to treatment.  You develop complications.  Your heart valve is severely damaged. HOME CARE INSTRUCTIONS  Take your antibiotic as directed by your health care provider. Finish the antibiotic even if you start to feel better.  Gradually resume your usual activities.  Let your health care provider know before you have any dental or surgical procedures. You may need to take antibiotics before the procedure.  Let all your health care providers, including your dentist, know that you have had endocarditis.  Do not get tattoos or body piercings.  Do not use IV drugs unless it is part of your medical treatment.  Practice good oral hygiene. This includes:  Brushing and flossing regularly.  Scheduling routine dental appointments. SEEK MEDICAL CARE IF:  You have a fever.  Your symptoms do not improve.  Your symptoms get worse.  Your symptoms come back. SEEK IMMEDIATE MEDICAL CARE IF:  You have trouble breathing.  You have chest pain.  You have symptoms of stroke. These include:  Sudden weakness.  Numbness.  Confusion.  Trouble talking.  A severe headache.   This information is not intended to replace advice given to you by your health care provider. Make sure you discuss any questions you have with your health care provider.   Document Released: 06/24/2005 Document Revised: 07/15/2014 Document Reviewed: 02/08/2014 Elsevier Interactive Patient Education [employer].

## 2015-04-15 DIAGNOSIS — R918 Other nonspecific abnormal finding of lung field: Secondary | ICD-10-CM | POA: Insufficient documentation

## 2015-04-15 DIAGNOSIS — D509 Iron deficiency anemia, unspecified: Secondary | ICD-10-CM | POA: Diagnosis not present

## 2015-04-15 DIAGNOSIS — N186 End stage renal disease: Secondary | ICD-10-CM | POA: Diagnosis not present

## 2015-04-15 DIAGNOSIS — I38 Endocarditis, valve unspecified: Secondary | ICD-10-CM | POA: Diagnosis not present

## 2015-04-15 DIAGNOSIS — N2581 Secondary hyperparathyroidism of renal origin: Secondary | ICD-10-CM | POA: Diagnosis not present

## 2015-04-15 DIAGNOSIS — D631 Anemia in chronic kidney disease: Secondary | ICD-10-CM | POA: Diagnosis not present

## 2015-04-18 DIAGNOSIS — D509 Iron deficiency anemia, unspecified: Secondary | ICD-10-CM | POA: Diagnosis not present

## 2015-04-18 DIAGNOSIS — N2581 Secondary hyperparathyroidism of renal origin: Secondary | ICD-10-CM | POA: Diagnosis not present

## 2015-04-18 DIAGNOSIS — N186 End stage renal disease: Secondary | ICD-10-CM | POA: Diagnosis not present

## 2015-04-18 DIAGNOSIS — D631 Anemia in chronic kidney disease: Secondary | ICD-10-CM | POA: Diagnosis not present

## 2015-04-18 DIAGNOSIS — I38 Endocarditis, valve unspecified: Secondary | ICD-10-CM | POA: Diagnosis not present

## 2015-04-19 ENCOUNTER — Ambulatory Visit: Payer: Medicare Other | Admitting: Vascular Surgery

## 2015-04-19 LAB — Q FEVER ABS IGG, IGM W/ REFLEX TITER
Q FEVER IGM PHASE II SCREEN 1: NEGATIVE
Q fever IgG phase I screen 1: NEGATIVE
Q fever IgG phase II screen 1: NEGATIVE
Q fever IgM phase I screen 1: NEGATIVE

## 2015-04-20 DIAGNOSIS — D631 Anemia in chronic kidney disease: Secondary | ICD-10-CM | POA: Diagnosis not present

## 2015-04-20 DIAGNOSIS — I38 Endocarditis, valve unspecified: Secondary | ICD-10-CM | POA: Diagnosis not present

## 2015-04-20 DIAGNOSIS — N2581 Secondary hyperparathyroidism of renal origin: Secondary | ICD-10-CM | POA: Diagnosis not present

## 2015-04-20 DIAGNOSIS — D509 Iron deficiency anemia, unspecified: Secondary | ICD-10-CM | POA: Diagnosis not present

## 2015-04-20 DIAGNOSIS — N186 End stage renal disease: Secondary | ICD-10-CM | POA: Diagnosis not present

## 2015-04-20 LAB — CHLAMYDIA PANEL SERUM
C. Pneumoniae IgG Serum: 1:256 {titer} — AB
C. Psittaci IgG Serum: <1:64 {titer}
C. Psittaci IgM Serum: <1:20 {titer}
C. Trachomatis IgM Serum: <1:20 {titer}

## 2015-04-22 DIAGNOSIS — I38 Endocarditis, valve unspecified: Secondary | ICD-10-CM | POA: Diagnosis not present

## 2015-04-22 DIAGNOSIS — D631 Anemia in chronic kidney disease: Secondary | ICD-10-CM | POA: Diagnosis not present

## 2015-04-22 DIAGNOSIS — N186 End stage renal disease: Secondary | ICD-10-CM | POA: Diagnosis not present

## 2015-04-22 DIAGNOSIS — N2581 Secondary hyperparathyroidism of renal origin: Secondary | ICD-10-CM | POA: Diagnosis not present

## 2015-04-22 DIAGNOSIS — D509 Iron deficiency anemia, unspecified: Secondary | ICD-10-CM | POA: Diagnosis not present

## 2015-04-24 ENCOUNTER — Ambulatory Visit (INDEPENDENT_AMBULATORY_CARE_PROVIDER_SITE_OTHER): Payer: Medicare Other | Admitting: Thoracic Surgery (Cardiothoracic Vascular Surgery)

## 2015-04-24 ENCOUNTER — Encounter: Payer: Self-pay | Admitting: Thoracic Surgery (Cardiothoracic Vascular Surgery)

## 2015-04-24 VITALS — BP 130/90 | HR 90 | Resp 20 | Ht 71.0 in | Wt 214.0 lb

## 2015-04-24 DIAGNOSIS — I330 Acute and subacute infective endocarditis: Secondary | ICD-10-CM

## 2015-04-24 DIAGNOSIS — Z9889 Other specified postprocedural states: Secondary | ICD-10-CM | POA: Diagnosis not present

## 2015-04-24 DIAGNOSIS — T826XXA Infection and inflammatory reaction due to cardiac valve prosthesis, initial encounter: Secondary | ICD-10-CM

## 2015-04-24 DIAGNOSIS — I38 Endocarditis, valve unspecified: Secondary | ICD-10-CM

## 2015-04-24 NOTE — Progress Notes (Signed)
[address]       [place] [address]             [phone]     CARDIOTHORACIC SURGERY OFFICE NOTE  Referring Provider is 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  Early postoperatively he developed recurrent paroxysmal atrial fibrillation for which he was treated with amiodarone and anticoagulated using warfarin. Despite his complicated presentation and numerous comorbid medical problems, the patient recovered remarkably well and was last seen here in follow-up in our office on 06/13/2014 at which time he was doing well.  Follow-up transthoracic echocardiogram performed that same day revealed intact mitral valve repair with no residual mitral regurgitation and normal left ventricular systolic function with ejection fraction estimated 55-60%.  The patient was seen in follow up that same day by Dr. Aundra Dubin, but he has not been seen in follow-up by cardiology since that time for unclear reasons.  The patient has remained stable from a cardiac standpoint until last month.  He was evaluated briefly in the emergency department on 03/19/2015 for atypical chest pain and shortness of breath. He was not admitted to the hospital. He returned to the hospital 04/06/2015 with worsening productive cough, generalized fatigue, night sweats, fever and chills.  CT scan of the chest revealed several new set of solid pulmonary nodules scattered throughout both lungs, the largest of which measured 1.6 cm in the right upper lobe and a second cavitary lesion measuring 1.5 cm in the  left upper lobe that were felt most consistent with septic emboli. The patient also was noted to have  2 large complex lesions involving the left kidney associated with perinephric soft tissue stranding felt most consistent with either infected or hemorrhagic renal cysts.  The patient was hospitalized from 04/06/2015 through 04/14/2015. He was seen in consultation by pulmonary medicine, nephrology, urology, and infectious disease. He underwent a transesophageal echocardiogram by Dr. Aundra Dubin on 04/12/2015 that revealed a small mobile vegetation adherent to the atrial surface of the anterior leaflet of the patient's repaired mitral valve consistent with bacterial endocarditis.  There was trivial mitral regurgitation. Left ventricular systolic function remains stable with ejection fraction estimated 50%. No other significant abnormalities were noted. In particular, there were no signs of vegetations involving any of the other heart valves.  3 sets of blood cultures obtained on 04/05/2015 remain no growth. The patient was treated empirically with intravenous Ancef and vancomycin which reportedly has been administered during hemodialysis since hospital discharge.  The patient has not been seen in follow-up since hospital discharge other than routine visits for hemodialysis at the [facility].  The patient states that he feels better than he did at the time of hospitalization several weeks ago. His cough has resolved. He denies any exertional shortness of breath. Fevers have resolved. He still feels quite tired most of the time. Appetite is improved and he is eating well. He denies any pain in the chest, abdomen, or back. He reports that he did have some pain in his back at the time of his hospitalization, but this has improved.  Patient denies any recent history of problems  related to his dialysis access site.   Current Outpatient Prescriptions  Medication Sig Dispense Refill  . amiodarone (PACERONE)  200 MG tablet Take 200 mg by mouth daily.    Marland Kitchen ceFAZolin (ANCEF) 2-3 GM-% SOLR Inject 50 mLs (2 g total) into the vein Every Tuesday,Thursday,and Saturday with dialysis. Duration: 6 weeks with start date 04/07/2015.    . cinacalcet (SENSIPAR) 90 MG tablet Take 1 tablet (90 mg total) by mouth 2 (two) times daily.    Marland Kitchen lisinopril (PRINIVIL,ZESTRIL) 20 MG tablet Take 20 mg by mouth daily.    . multivitamin (RENA-VIT) TABS tablet Take 1 tablet by mouth at bedtime. 30 tablet 0  . Nutritional Supplements (FEEDING SUPPLEMENT, NEPRO CARB STEADY,) LIQD Take 237 mLs by mouth 2 (two) times daily between meals.    . sevelamer carbonate (RENVELA) 800 MG tablet Take 3 tablets (2,400 mg total) by mouth 3 (three) times daily with meals.    . vancomycin (VANCOCIN) 1 GM/200ML SOLN Inject 200 mLs (1,000 mg total) into the vein Every Tuesday,Thursday,and Saturday with dialysis. Duration: 6 weeks with start date 04/07/2015.    . warfarin (COUMADIN) 5 MG tablet Take as directed by [facility] (Patient taking differently: Take 7.5-10 mg by mouth daily at 6 PM. Takes 7.5mg  on sun only takes 10mg  all other days) 60 tablet 2   No current facility-administered medications for this visit.      Physical Exam:   BP 130/90 mmHg  Pulse 90  Resp 20  Ht 5\' 11"  (1.803 m)  Wt 214 lb (97.07 kg)  BMI 29.86 kg/m2  SpO2 95%  General:  Adult AA male in NAD  Chest:   Clear to auscultation  CV:   Regular rate and rhythm  Incisions:  n/a  Abdomen:  Soft and nontender  Extremities:  Warm and well perfused  Diagnostic Tests:  Transthoracic Echocardiography  Patient:  Timothy Palmer, Niziolek MR #:    [id number] Study Date: 04/06/2015 Gender:   M Age:    49 Height:   180.3 cm Weight:   104.8 kg BSA:    2.32 m^2 Pt. Status: Room:    [place] ATTENDING  Orson Eva [id number] Chapman Fitch, Pranav M ORDERING   [place], [place] M REFERRING  Berle Mull M PERFORMING   [facility], Inpatient  cc:  ------------------------------------------------------------------- LV EF: 50% -  55%  ------------------------------------------------------------------- History:  PMH: History of endocarditis. Mitral valve repair. End stage renal disease. SBE 421.0. Congestive heart failure. Risk factors: Hypertension.  ------------------------------------------------------------------- Study Conclusions  - Left ventricle: The cavity size was normal. Wall thickness was increased in a pattern of mild LVH. Systolic function was normal. The estimated ejection fraction was in the range of 50% to 55%. Wall motion was normal; there were no regional wall motion abnormalities. - Aortic valve: There was very mild stenosis. Valve area (VTI): 1.45 cm^2. Valve area (Vmax): 1.25 cm^2. Valve area (Vmean): 1.19 cm^2. - Mitral valve: Prior procedures included surgical repair. Valve area by pressure half-time: 2.12 cm^2. Valve area by continuity equation (using LVOT flow): 1.12 cm^2. - Left atrium: The atrium was mildly dilated.  Transthoracic echocardiography. M-mode, complete 2D, spectral Doppler, and color Doppler. Birthdate: Patient birthdate: [birth year]. Age: Patient is 50 yr old. Sex: Gender: male. BMI: 32.2 kg/m^2. Blood pressure:   142/86 Patient status: Inpatient. Study date: Study date: 04/06/2015. Study time: 02:30 PM. Location: Bedside.  -------------------------------------------------------------------  ------------------------------------------------------------------- Left ventricle: The cavity size was normal. Wall thickness was increased in a  pattern of mild LVH. Systolic function was normal. The estimated ejection fraction was in the range of 50% to 55%. Wall motion was normal; there were no regional wall motion abnormalities.  ------------------------------------------------------------------- Aortic valve:  Mildly  thickened, mildly calcified leaflets. Doppler:  There was very mild stenosis.  There was no significant regurgitation.  VTI ratio of LVOT to aortic valve: 0.46. Valve area (VTI): 1.45 cm^2. Indexed valve area (VTI): 0.62 cm^2/m^2. Peak velocity ratio of LVOT to aortic valve: 0.4. Valve area (Vmax): 1.25 cm^2. Indexed valve area (Vmax): 0.54 cm^2/m^2. Mean velocity ratio of LVOT to aortic valve: 0.38. Valve area (Vmean): 1.19 cm^2. Indexed valve area (Vmean): 0.51 cm^2/m^2.  Mean gradient (S): 16 mm Hg. Peak gradient (S): 27 mm Hg.  ------------------------------------------------------------------- Aorta: The aorta was normal, not dilated, and non-diseased.  ------------------------------------------------------------------- Mitral valve: Prior procedures included surgical repair. Mitral valve leaflets appear to be moving well. There does not appear to be any significant valvular mitral stenosis. Heavy mitral annular calcification may be contributing to the diastolic mitral gradient. Doppler: There was no significant regurgitation.  Valve area by pressure half-time: 2.12 cm^2. Indexed valve area by pressure half-time: 0.91 cm^2/m^2. Valve area by continuity equation (using LVOT flow): 1.12 cm^2. Indexed valve area by continuity equation (using LVOT flow): 0.48 cm^2/m^2.  Mean gradient (D): 10 mm Hg. Peak gradient (D): 18 mm Hg.  ------------------------------------------------------------------- Left atrium: The atrium was mildly dilated.  ------------------------------------------------------------------- Right ventricle: The cavity size was normal. Wall thickness was normal. Systolic function was normal.  ------------------------------------------------------------------- Pulmonic valve:  Poorly visualized.  ------------------------------------------------------------------- Tricuspid valve:  Mildly thickened leaflets. Doppler: There was mild  regurgitation.  ------------------------------------------------------------------- Right atrium: The atrium was at the upper limits of normal in size.  ------------------------------------------------------------------- Pericardium: There was no pericardial effusion.  ------------------------------------------------------------------- Systemic veins: Inferior vena cava: The vessel was normal in size. The respirophasic diameter changes were in the normal range (>= 50%), consistent with normal central venous pressure.  ------------------------------------------------------------------- Post procedure conclusions Ascending Aorta:  - The aorta was normal, not dilated, and non-diseased.  ------------------------------------------------------------------- Measurements  Left ventricle              Value     Reference LV ID, ED, PLAX chordal          51.1 mm    43 - 52 LV ID, ES, PLAX chordal          34.2 mm    23 - 38 LV fx shortening, PLAX chordal      33  %    >=29 LV PW thickness, ED            12.4 mm    --------- IVS/LV PW ratio, ED            0.91      <=1.3 Stroke volume, 2D             75  ml    --------- Stroke volume/bsa, 2D           32  ml/m^2  ---------  Ventricular septum            Value     Reference IVS thickness, ED             11.3 mm    ---------  LVOT                   Value     Reference LVOT ID, S  20  mm    --------- LVOT area                 3.14 cm^2   --------- LVOT peak velocity, S           104  cm/s   --------- LVOT mean velocity, S           70.3 cm/s   --------- LVOT VTI, S                23.8 cm    ---------  Aortic valve               Value      Reference Aortic valve peak velocity, S       262  cm/s   --------- Aortic valve mean velocity, S       185  cm/s   --------- Aortic valve VTI, S            51.6 cm    --------- Aortic mean gradient, S          16  mm Hg  --------- Aortic peak gradient, S          27  mm Hg  --------- VTI ratio, LVOT/AV            0.46      --------- Aortic valve area, VTI          1.45 cm^2   --------- Aortic valve area/bsa, VTI        0.62 cm^2/m^2 --------- Velocity ratio, peak, LVOT/AV       0.4      --------- Aortic valve area, peak velocity     1.25 cm^2   --------- Aortic valve area/bsa, peak        0.54 cm^2/m^2 --------- velocity Velocity ratio, mean, LVOT/AV       0.38      --------- Aortic valve area, mean velocity     1.19 cm^2   --------- Aortic valve area/bsa, mean        0.51 cm^2/m^2 --------- velocity  Aorta                   Value     Reference Aortic root ID, ED            27  mm    ---------  Left atrium                Value     Reference LA ID, A-P, ES              48  mm    --------- LA ID/bsa, A-P              2.07 cm/m^2  <=2.2 LA volume, S               72.4 ml    --------- LA volume/bsa, S             31.2 ml/m^2  --------- LA volume, ES, 1-p A4C          77.7 ml    --------- LA volume/bsa, ES, 1-p A4C        33.5 ml/m^2  --------- LA volume, ES, 1-p A2C          63.9 ml    --------- LA volume/bsa, ES, 1-p A2C        27.5 ml/m^2  ---------  Mitral valve  Value     Reference Mitral E-wave peak velocity        210  cm/s   --------- Mitral A-wave peak velocity         153  cm/s   --------- Mitral mean velocity, D          152  cm/s   --------- Mitral deceleration time     (H)   363  ms    150 - 230 Mitral pressure half-time         104  ms    --------- Mitral mean gradient, D          10  mm Hg  --------- Mitral peak gradient, D          18  mm Hg  --------- Mitral E/A ratio, peak          1.4      --------- Mitral valve area, PHT, DP        2.12 cm^2   --------- Mitral valve area/bsa, PHT, DP      0.91 cm^2/m^2 --------- Mitral valve area, LVOT          1.12 cm^2   --------- continuity Mitral valve area/bsa, LVOT        0.48 cm^2/m^2 --------- continuity Mitral annulus VTI, D           66.6 cm    ---------  Legend: (L) and (H) mark values outside specified reference range.  ------------------------------------------------------------------- Prepared and Electronically Authenticated by  Darlin Coco, MD 2016-09-29T18:12:48     Transesophageal Echocardiography  Patient:  Davine, Sanandres MR #:    [id number] Study Date: 04/12/2015 Gender:   M Age:    55 Height:   180.3 cm Weight:   96.2 kg BSA:    2.22 m^2 Pt. Status: Room:    6E18C  ATTENDING  Vernell Leep D PERFORMING  Loralie Champagne, M.D. ADMITTING  Berle Mull [address], [place] [id number] Harrietta Guardian [id number] SONOGRAPHER Roseanna Rainbow  cc:  -------------------------------------------------------------------  ------------------------------------------------------------------- Indications:   Endocarditis 421.9.  ------------------------------------------------------------------- History:  PMH: MV replacement. ESRD. Atrial fibrillation. Risk factors: Hypertension.  ------------------------------------------------------------------- Study Conclusions  - Left  ventricle: Normal left ventricular size with mild LV hypertrophy. EF 50% with septal bounce. - Aortic valve: Trileaflet; moderately calcified leaflets. No evidence of vegetation. There was mild stenosis. - Aorta: Normal caliber aorta with mild plaque. - Mitral valve: The mitral valve was status post repair. There was trivial MR. Mean gradient 6 mmHg but pressure half-time 78 msec so doubt significant stenosis. There was a small mobile vegetation on the anterior leaflet of the mitral valve. - Left atrium: The atrium was moderately dilated. No evidence of thrombus in the atrial cavity or appendage. - Right ventricle: The cavity size was normal. Systolic function was normal. - Right atrium: The atrium was mildly dilated. - Atrial septum: No defect or patent foramen ovale was identified. - Tricuspid valve: No evidence of vegetation. - Pulmonic valve: No evidence of vegetation.  Impressions:  - Recurrent mitral valve endocarditis, see description above.  Diagnostic transesophageal echocardiography. 2D and color Doppler. Birthdate: Patient birthdate: 01-12-1966. Age: Patient is 49 yr old. Sex: Gender: male.  BMI: 29.6 kg/m^2. Blood pressure: 120/84 Patient status: Inpatient. Study date: Study date: 04/12/2015. Study time: 08:58 AM. Location: Endoscopy.  -------------------------------------------------------------------  ------------------------------------------------------------------- Left ventricle: Normal left ventricular size with mild LV hypertrophy. EF 50% with septal bounce.  ------------------------------------------------------------------- Aortic valve:  Trileaflet; moderately calcified leaflets. No  evidence of vegetation. Doppler:  There was mild stenosis. There was no significant regurgitation.  ------------------------------------------------------------------- Aorta: Normal caliber aorta with mild  plaque.  ------------------------------------------------------------------- Mitral valve: The mitral valve was status post repair. There was trivial MR. Mean gradient 6 mmHg but pressure half-time 78 msec so doubt significant stenosis. There was a small mobile vegetation on the anterior leaflet of the mitral valve. Doppler:   Valve area by pressure half-time: 2.82 cm^2. Indexed valve area by pressure half-time: 1.27 cm^2/m^2.  Mean gradient (D): 6 mm Hg.  ------------------------------------------------------------------- Left atrium: The atrium was moderately dilated. No evidence of thrombus in the atrial cavity or appendage.  ------------------------------------------------------------------- Atrial septum: No defect or patent foramen ovale was identified.  ------------------------------------------------------------------- Right ventricle: The cavity size was normal. Systolic function was normal.  ------------------------------------------------------------------- Pulmonic valve:  Structurally normal valve.  Cusp separation was normal. No evidence of vegetation.  ------------------------------------------------------------------- Tricuspid valve:  No evidence of vegetation. Doppler: There was trivial regurgitation.  ------------------------------------------------------------------- Right atrium: The atrium was mildly dilated.  ------------------------------------------------------------------- Pericardium: There was no pericardial effusion.  ------------------------------------------------------------------- Post procedure conclusions Ascending Aorta:  - Normal caliber aorta with mild plaque.  ------------------------------------------------------------------- Measurements  Mitral valve            Value Mitral mean velocity, D       115  cm/s Mitral pressure half-time      78  ms Mitral mean gradient, D        6   mm Hg Mitral valve area, PHT, DP     2.82 cm^2 Mitral valve area/bsa, PHT, DP   1.27 cm^2/m^2 Mitral annulus VTI, D        43.6 cm  Legend: (L) and (H) mark values outside specified reference range.  ------------------------------------------------------------------- Prepared and Electronically Authenticated by  Loralie Champagne, M.D. 2016-10-05T21:55:36   Impression:  I have personally reviewed the patient's recent transesophageal echocardiogram performed 04/12/2015. The patient has what appears to be a vegetation adherent to the atrial surface of the anterior leaflet of the patient's repaired mitral valve. There is trivial mitral regurgitation and there are no other complicating features. 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  Given that it has been over 12 months since the patient's original presentation with bacterial endocarditis, the current episode is most likely new and unrelated to the patient's episode of bacterial endocarditis last year.  This is particularly true because the original organism that caused the patient's endocarditis in 2015 was methicillin sensitive Staphylococcus aureus.  Fortunately, the patient appears to be clinically doing well on antibiotics at this time. The patient has not developed any complications related to bacterial endocarditis, his mitral valve repair remains intact, and overall he feels better on antibiotic therapy.   Plan:  I feel that the patient should be followed very carefully and undergo repeat transesophageal echocardiogram prior to stopping antibiotic therapy.  An exhaustive search for any potential source of bacteremia should be performed.  He needs dental service consultation and may need dental extraction.  He needs to follow-up with  urology with repeat imaging of his renal cysts.  He has been maintaining sinus rhythm and probably could safely be taken off of Coumadin temporarily if any surgical interventions were planned.  At some point it might also be reasonable to stop amiodarone.  I have asked the patient to return to our office  in 3 months to see how he is getting along. We will defer all decisions regarding his management at this point to the numerous consulting services that were involved during his recent hospitalization.   I spent in excess of 30 minutes during the conduct of this office consultation and >50% of this time involved direct face-to-face encounter with the patient for counseling and/or coordination of their care.    Valentina Gu. Roxy Manns, MD 04/24/2015 3:54 PM

## 2015-04-24 NOTE — Patient Instructions (Addendum)
Continue all previous medications without any changes at this time  Schedule an appointment with Dr Lawana Chambers in the dental service clinic  Schedule an appointment in the Infectious Disease clinic  Schedule a follow up appointment with Dr Aundra Dubin for repeat TEE within 4-6 weeks

## 2015-04-25 DIAGNOSIS — I38 Endocarditis, valve unspecified: Secondary | ICD-10-CM | POA: Diagnosis not present

## 2015-04-25 DIAGNOSIS — D631 Anemia in chronic kidney disease: Secondary | ICD-10-CM | POA: Diagnosis not present

## 2015-04-25 DIAGNOSIS — D509 Iron deficiency anemia, unspecified: Secondary | ICD-10-CM | POA: Diagnosis not present

## 2015-04-25 DIAGNOSIS — N2581 Secondary hyperparathyroidism of renal origin: Secondary | ICD-10-CM | POA: Diagnosis not present

## 2015-04-25 DIAGNOSIS — N186 End stage renal disease: Secondary | ICD-10-CM | POA: Diagnosis not present

## 2015-04-26 ENCOUNTER — Telehealth (HOSPITAL_COMMUNITY): Payer: Self-pay

## 2015-04-26 NOTE — Telephone Encounter (Signed)
04/26/2015            Called and left msg. on patient's # and patient's sister # to call Dental Medicine to schedule Dental Consult w/Dr. Enrique Sack.  LRI

## 2015-04-27 DIAGNOSIS — R7881 Bacteremia: Secondary | ICD-10-CM | POA: Diagnosis not present

## 2015-04-27 DIAGNOSIS — D509 Iron deficiency anemia, unspecified: Secondary | ICD-10-CM | POA: Diagnosis not present

## 2015-04-27 DIAGNOSIS — D631 Anemia in chronic kidney disease: Secondary | ICD-10-CM | POA: Diagnosis not present

## 2015-04-27 DIAGNOSIS — I38 Endocarditis, valve unspecified: Secondary | ICD-10-CM | POA: Diagnosis not present

## 2015-04-27 DIAGNOSIS — N2581 Secondary hyperparathyroidism of renal origin: Secondary | ICD-10-CM | POA: Diagnosis not present

## 2015-04-27 DIAGNOSIS — N186 End stage renal disease: Secondary | ICD-10-CM | POA: Diagnosis not present

## 2015-04-28 ENCOUNTER — Encounter: Payer: Self-pay | Admitting: Adult Health

## 2015-04-28 ENCOUNTER — Telehealth: Payer: Self-pay | Admitting: Pulmonary Disease

## 2015-04-28 ENCOUNTER — Ambulatory Visit (INDEPENDENT_AMBULATORY_CARE_PROVIDER_SITE_OTHER): Payer: Medicare Other | Admitting: Adult Health

## 2015-04-28 VITALS — BP 124/82 | HR 96 | Temp 98.4°F | Ht 71.0 in | Wt 222.0 lb

## 2015-04-28 DIAGNOSIS — J984 Other disorders of lung: Secondary | ICD-10-CM

## 2015-04-28 DIAGNOSIS — R05 Cough: Secondary | ICD-10-CM

## 2015-04-28 DIAGNOSIS — R918 Other nonspecific abnormal finding of lung field: Secondary | ICD-10-CM

## 2015-04-28 DIAGNOSIS — R059 Cough, unspecified: Secondary | ICD-10-CM

## 2015-04-28 DIAGNOSIS — I330 Acute and subacute infective endocarditis: Secondary | ICD-10-CM | POA: Diagnosis not present

## 2015-04-28 NOTE — Assessment & Plan Note (Signed)
clinically is improving  Will plan on repeat CT chest in 2-3 weeks   Plan  Finish Antibiotics as planned.  Follow up with ID clinic next month as planned  Follow up with Dr. Ashok Cordia in 2-3 weeks .  We are setting you CT chest in 2-3 weeks .  Please contact office for sooner follow up if symptoms do not improve or worsen or seek emergency care

## 2015-04-28 NOTE — Patient Instructions (Addendum)
Finish Antibiotics as planned.  Follow up with ID clinic next month as planned  Follow up with Dr. Ashok Cordia in 2-3 weeks .  We are setting you CT chest in 2-3 weeks .  Please contact office for sooner follow up if symptoms do not improve or worsen or seek emergency care

## 2015-04-28 NOTE — Progress Notes (Signed)
Subjective:    Patient ID: Timothy Palmer, male    DOB: February 21, 1966, 49 y.o.   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 He has end-stage renal disease on dialysis. Patient presented with progressive shortness of breath and cough. CT chest showed bilateral mediastinal and hilar adenopathy , several new scattered pulmonary nodules including a cavitary 1.5 cm left upper lobe pulmonary nodule. Progressive. Calcified pleural plaques in the right pleural space. CT abdomen and pelvis showed 2 large complex lesions along the left kidney felt to be consistent with hemorrhagic or infected cyst. Patient was seen by infectious disease . Felt cavitary lung nodule was from infectious embolic phenomenon from infected renal cyst.  Sputum for AFB was negative 3 and he had a negative QuantiFERON gold test.  A transesophageal echo showed a small mobile vegetation along the anterior leaflet of the mitral valve which did not appear to be compromising eval that was consistent with a recurrent endocarditis. Patient was recommended to use IV vancomycin and Ancef with dialysis for the next 6 weeks. Plan is to repeat CT chest in 6 weeks following antibiotics.  Since discharge. Patient is feeling some better with decreased cough and congestion. He denies any hemoptysis, chest pain, orthopnea, PND, or increased leg swelling   Review of Systems Constitutional:   No  weight loss, night sweats,  Fevers, chills,  +fatigue, or  lassitude.  HEENT:   No headaches,  Difficulty swallowing,   Tooth/dental problems, or  Sore throat,                No sneezing, itching, ear ache, nasal congestion, post nasal drip,   CV:  No chest pain,  Orthopnea, PND, swelling in lower extremities, anasarca, dizziness, palpitations, syncope.   GI  No heartburn, indigestion, abdominal pain, nausea, vomiting, diarrhea, change in bowel habits, loss of appetite, bloody stools.   Resp:    No chest wall deformity  Skin: no rash or lesions.  GU: no dysuria, change in color of urine, no urgency or frequency.  No flank pain, no hematuria   MS:  No joint pain or swelling.  No decreased range of motion.  No back pain.  Psych:  No change in mood or affect. No depression or anxiety.  No memory loss.         Objective:   Physical Exam  ,GEN: A/Ox3; pleasant , NAD, well nourished    HEENT:  [place]/AT,  EACs-clear, TMs-wnl, NOSE-clear, THROAT-clear, no lesions, no postnasal drip or exudate noted.   NECK:  Supple w/ fair ROM; no JVD; normal carotid impulses w/o bruits; no thyromegaly or nodules palpated; no lymphadenopathy.  RESP  Decreased BS in bases w/o, wheezes/ rales/ or rhonchi.no accessory muscle use, no dullness to percussion  CARD:  RRR, no m/r/g  , no peripheral edema, pulses intact, no cyanosis or clubbing.  GI:   Soft & nt; nml bowel sounds; no organomegaly or masses detected.  Musco: Warm bil, no deformities or joint swelling noted.   Neuro: alert, no focal deficits noted.    Skin: Warm, no lesions  or rashes  Left forearm fistula       Assessment & Plan:

## 2015-04-28 NOTE — Assessment & Plan Note (Signed)
?  septic emboli from infected renal cysts  Cont IV abx Follow up CT chest in 2-3 weeks

## 2015-04-28 NOTE — Telephone Encounter (Signed)
lmtcb for pt.  

## 2015-04-28 NOTE — Assessment & Plan Note (Signed)
Cont IV abx  Follow up with ID clinic as planned in 2-3 weeks

## 2015-04-29 DIAGNOSIS — N186 End stage renal disease: Secondary | ICD-10-CM | POA: Diagnosis not present

## 2015-04-29 DIAGNOSIS — I38 Endocarditis, valve unspecified: Secondary | ICD-10-CM | POA: Diagnosis not present

## 2015-04-29 DIAGNOSIS — D509 Iron deficiency anemia, unspecified: Secondary | ICD-10-CM | POA: Diagnosis not present

## 2015-04-29 DIAGNOSIS — D631 Anemia in chronic kidney disease: Secondary | ICD-10-CM | POA: Diagnosis not present

## 2015-04-29 DIAGNOSIS — N2581 Secondary hyperparathyroidism of renal origin: Secondary | ICD-10-CM | POA: Diagnosis not present

## 2015-05-01 NOTE — Telephone Encounter (Signed)
Per 04/28/15 OV w/ TP: Patient Instructions       Finish Antibiotics as planned.   Follow up with ID clinic next month as planned   Follow up with Dr. Ashok Cordia in 2-3 weeks .   We are setting you CT chest in 2-3 weeks .   Please contact office for sooner follow up if symptoms do not improve or worsen or seek emergency care   ---  Dr. Ashok Cordia, please advise as you are booked up until january thanks

## 2015-05-02 DIAGNOSIS — N186 End stage renal disease: Secondary | ICD-10-CM | POA: Diagnosis not present

## 2015-05-02 DIAGNOSIS — N2581 Secondary hyperparathyroidism of renal origin: Secondary | ICD-10-CM | POA: Diagnosis not present

## 2015-05-02 DIAGNOSIS — D631 Anemia in chronic kidney disease: Secondary | ICD-10-CM | POA: Diagnosis not present

## 2015-05-02 DIAGNOSIS — I38 Endocarditis, valve unspecified: Secondary | ICD-10-CM | POA: Diagnosis not present

## 2015-05-02 DIAGNOSIS — D509 Iron deficiency anemia, unspecified: Secondary | ICD-10-CM | POA: Diagnosis not present

## 2015-05-03 ENCOUNTER — Telehealth (HOSPITAL_COMMUNITY): Payer: Self-pay

## 2015-05-03 NOTE — Telephone Encounter (Signed)
08/02/14            Called and left another msg. for patient to call Dental Medicine and schedule Dental Consult w/Dr. Enrique Sack.  LRI

## 2015-05-03 NOTE — Telephone Encounter (Signed)
Dr. Nestor please advise. Thanks. 

## 2015-05-04 DIAGNOSIS — Z5181 Encounter for therapeutic drug level monitoring: Secondary | ICD-10-CM | POA: Diagnosis not present

## 2015-05-04 DIAGNOSIS — Z7901 Long term (current) use of anticoagulants: Secondary | ICD-10-CM | POA: Diagnosis not present

## 2015-05-04 DIAGNOSIS — I38 Endocarditis, valve unspecified: Secondary | ICD-10-CM | POA: Diagnosis not present

## 2015-05-04 DIAGNOSIS — N186 End stage renal disease: Secondary | ICD-10-CM | POA: Diagnosis not present

## 2015-05-04 DIAGNOSIS — D631 Anemia in chronic kidney disease: Secondary | ICD-10-CM | POA: Diagnosis not present

## 2015-05-04 DIAGNOSIS — R7881 Bacteremia: Secondary | ICD-10-CM | POA: Diagnosis not present

## 2015-05-04 DIAGNOSIS — I481 Persistent atrial fibrillation: Secondary | ICD-10-CM | POA: Diagnosis not present

## 2015-05-04 DIAGNOSIS — E1129 Type 2 diabetes mellitus with other diabetic kidney complication: Secondary | ICD-10-CM | POA: Diagnosis not present

## 2015-05-04 DIAGNOSIS — D509 Iron deficiency anemia, unspecified: Secondary | ICD-10-CM | POA: Diagnosis not present

## 2015-05-04 DIAGNOSIS — N2581 Secondary hyperparathyroidism of renal origin: Secondary | ICD-10-CM | POA: Diagnosis not present

## 2015-05-04 LAB — PROTIME-INR: INR: 1 (ref 0.9–1.1)

## 2015-05-05 NOTE — Telephone Encounter (Signed)
lmomtcb x1. JN schedule opened on 11/14 to add pt in 30 min slot.

## 2015-05-05 NOTE — Telephone Encounter (Signed)
Add this name to the list of patient's we could work in on 11/14. Thanks.

## 2015-05-06 DIAGNOSIS — I38 Endocarditis, valve unspecified: Secondary | ICD-10-CM | POA: Diagnosis not present

## 2015-05-06 DIAGNOSIS — D509 Iron deficiency anemia, unspecified: Secondary | ICD-10-CM | POA: Diagnosis not present

## 2015-05-06 DIAGNOSIS — D631 Anemia in chronic kidney disease: Secondary | ICD-10-CM | POA: Diagnosis not present

## 2015-05-06 DIAGNOSIS — N186 End stage renal disease: Secondary | ICD-10-CM | POA: Diagnosis not present

## 2015-05-06 DIAGNOSIS — N2581 Secondary hyperparathyroidism of renal origin: Secondary | ICD-10-CM | POA: Diagnosis not present

## 2015-05-08 ENCOUNTER — Ambulatory Visit (INDEPENDENT_AMBULATORY_CARE_PROVIDER_SITE_OTHER): Payer: Medicare Other | Admitting: Cardiology

## 2015-05-08 DIAGNOSIS — I120 Hypertensive chronic kidney disease with stage 5 chronic kidney disease or end stage renal disease: Secondary | ICD-10-CM | POA: Diagnosis not present

## 2015-05-08 DIAGNOSIS — Z992 Dependence on renal dialysis: Secondary | ICD-10-CM | POA: Diagnosis not present

## 2015-05-08 DIAGNOSIS — N186 End stage renal disease: Secondary | ICD-10-CM | POA: Diagnosis not present

## 2015-05-08 DIAGNOSIS — Z5181 Encounter for therapeutic drug level monitoring: Secondary | ICD-10-CM

## 2015-05-08 DIAGNOSIS — I76 Septic arterial embolism: Secondary | ICD-10-CM

## 2015-05-08 DIAGNOSIS — I2690 Septic pulmonary embolism without acute cor pulmonale: Secondary | ICD-10-CM

## 2015-05-08 DIAGNOSIS — I480 Paroxysmal atrial fibrillation: Secondary | ICD-10-CM

## 2015-05-08 NOTE — Telephone Encounter (Signed)
lmtcb x2 for pt. 

## 2015-05-09 DIAGNOSIS — N186 End stage renal disease: Secondary | ICD-10-CM | POA: Diagnosis not present

## 2015-05-09 DIAGNOSIS — D509 Iron deficiency anemia, unspecified: Secondary | ICD-10-CM | POA: Diagnosis not present

## 2015-05-09 DIAGNOSIS — D631 Anemia in chronic kidney disease: Secondary | ICD-10-CM | POA: Diagnosis not present

## 2015-05-09 DIAGNOSIS — I38 Endocarditis, valve unspecified: Secondary | ICD-10-CM | POA: Diagnosis not present

## 2015-05-09 DIAGNOSIS — N2581 Secondary hyperparathyroidism of renal origin: Secondary | ICD-10-CM | POA: Diagnosis not present

## 2015-05-09 NOTE — Telephone Encounter (Signed)
lmomtcb x 3  

## 2015-05-10 ENCOUNTER — Telehealth (HOSPITAL_COMMUNITY): Payer: Self-pay

## 2015-05-10 NOTE — Telephone Encounter (Signed)
05/10/15         Called and left msg. again regarding scheduling Dental Consult w/Dr. Enrique Sack. LRI

## 2015-05-10 NOTE — Telephone Encounter (Signed)
LMTCB and will close per triage protocol 

## 2015-05-11 DIAGNOSIS — N2581 Secondary hyperparathyroidism of renal origin: Secondary | ICD-10-CM | POA: Diagnosis not present

## 2015-05-11 DIAGNOSIS — D509 Iron deficiency anemia, unspecified: Secondary | ICD-10-CM | POA: Diagnosis not present

## 2015-05-11 DIAGNOSIS — D631 Anemia in chronic kidney disease: Secondary | ICD-10-CM | POA: Diagnosis not present

## 2015-05-11 DIAGNOSIS — N186 End stage renal disease: Secondary | ICD-10-CM | POA: Diagnosis not present

## 2015-05-11 DIAGNOSIS — I38 Endocarditis, valve unspecified: Secondary | ICD-10-CM | POA: Diagnosis not present

## 2015-05-11 DIAGNOSIS — R7881 Bacteremia: Secondary | ICD-10-CM | POA: Diagnosis not present

## 2015-05-13 DIAGNOSIS — D509 Iron deficiency anemia, unspecified: Secondary | ICD-10-CM | POA: Diagnosis not present

## 2015-05-13 DIAGNOSIS — D631 Anemia in chronic kidney disease: Secondary | ICD-10-CM | POA: Diagnosis not present

## 2015-05-13 DIAGNOSIS — N186 End stage renal disease: Secondary | ICD-10-CM | POA: Diagnosis not present

## 2015-05-13 DIAGNOSIS — N2581 Secondary hyperparathyroidism of renal origin: Secondary | ICD-10-CM | POA: Diagnosis not present

## 2015-05-13 DIAGNOSIS — I38 Endocarditis, valve unspecified: Secondary | ICD-10-CM | POA: Diagnosis not present

## 2015-05-15 ENCOUNTER — Ambulatory Visit (INDEPENDENT_AMBULATORY_CARE_PROVIDER_SITE_OTHER)
Admission: RE | Admit: 2015-05-15 | Discharge: 2015-05-15 | Disposition: A | Discharge status: Home / Self Care | Payer: Medicare Other | Source: Ambulatory Visit | Attending: Adult Health | Admitting: Adult Health

## 2015-05-15 DIAGNOSIS — R918 Other nonspecific abnormal finding of lung field: Secondary | ICD-10-CM | POA: Diagnosis not present

## 2015-05-15 DIAGNOSIS — R05 Cough: Secondary | ICD-10-CM | POA: Diagnosis not present

## 2015-05-15 DIAGNOSIS — R059 Cough, unspecified: Secondary | ICD-10-CM

## 2015-05-15 MED ORDER — IOHEXOL 300 MG/ML  SOLN
80.0000 mL | Freq: Once | INTRAMUSCULAR | Status: AC | PRN
  Administered 2015-05-15: 80 mL via INTRAVENOUS

## 2015-05-16 ENCOUNTER — Ambulatory Visit (INDEPENDENT_AMBULATORY_CARE_PROVIDER_SITE_OTHER): Payer: Medicare Other | Admitting: Internal Medicine

## 2015-05-16 ENCOUNTER — Encounter: Payer: Self-pay | Admitting: Internal Medicine

## 2015-05-16 VITALS — BP 125/86 | HR 86 | Temp 97.7°F | Wt 224.0 lb

## 2015-05-16 DIAGNOSIS — N2581 Secondary hyperparathyroidism of renal origin: Secondary | ICD-10-CM | POA: Diagnosis not present

## 2015-05-16 DIAGNOSIS — N186 End stage renal disease: Secondary | ICD-10-CM | POA: Diagnosis not present

## 2015-05-16 DIAGNOSIS — I059 Rheumatic mitral valve disease, unspecified: Secondary | ICD-10-CM

## 2015-05-16 DIAGNOSIS — I38 Endocarditis, valve unspecified: Secondary | ICD-10-CM | POA: Diagnosis not present

## 2015-05-16 DIAGNOSIS — D509 Iron deficiency anemia, unspecified: Secondary | ICD-10-CM | POA: Diagnosis not present

## 2015-05-16 DIAGNOSIS — I058 Other rheumatic mitral valve diseases: Secondary | ICD-10-CM | POA: Diagnosis present

## 2015-05-16 DIAGNOSIS — D631 Anemia in chronic kidney disease: Secondary | ICD-10-CM | POA: Diagnosis not present

## 2015-05-16 NOTE — Progress Notes (Signed)
Rfv: follow up for prosthetic valve infeciton Subjective:    Patient ID: Timothy Palmer, male    DOB: 02-May-1966, 49 y.o.   MRN: [id number]  HPI Timothy Palmer is a 49 yo M with prosthetic valve infection, currently on his 4th week of vancomycin plus cefazolin which he is tolerating without difficulty.   Timothy Palmer originally had complicated MSSA MV endocarditis  With septic emboli to LLE, spleen, and small intestine. He underwent minimal invasive MVR in 03/2014. He received antibiotics with HD since he is ESRD on HD via left arm fistula, DVT RUE, and HTN.  Hewas admitted in mid October 2016 with SOB, nightsweats, fevers, SOB. His chest CT showed multiple pulmonary lesions concerning for septic emboli. He had repeat TEE that shwoed smal mobile vegetation to anterior leaflet of mitral valve. Cx were NGTD. He was discharged on 6 wk of vancomycin plus cefazolin to end at the beginning of December. Returns for f/u. Doing well. No fevers or chills. Volume status managed with HD.   TEE 04/13/15: EF 50%. Mitral valve vegetation noted => small, mobile. Does not appear to be compromising valve currently. Trivial Timothy.   Current Outpatient Prescriptions on File Prior to Visit  Medication Sig Dispense Refill  . amiodarone (PACERONE) 200 MG tablet Take 200 mg by mouth daily.    Marland Kitchen ceFAZolin (ANCEF) 2-3 GM-% SOLR Inject 50 mLs (2 g total) into the vein Every Tuesday,Thursday,and Saturday with dialysis. Duration: 6 weeks with start date 04/07/2015.    . cinacalcet (SENSIPAR) 90 MG tablet Take 1 tablet (90 mg total) by mouth 2 (two) times daily.    Marland Kitchen lisinopril (PRINIVIL,ZESTRIL) 20 MG tablet Take 20 mg by mouth daily.    . multivitamin (RENA-VIT) TABS tablet Take 1 tablet by mouth at bedtime. 30 tablet 0  . Nutritional Supplements (FEEDING SUPPLEMENT, NEPRO CARB STEADY,) LIQD Take 237 mLs by mouth 2 (two) times daily between meals.    . sevelamer carbonate (RENVELA) 800 MG tablet Take 3 tablets (2,400 mg total) by  mouth 3 (three) times daily with meals.    . vancomycin (VANCOCIN) 1 GM/200ML SOLN Inject 200 mLs (1,000 mg total) into the vein Every Tuesday,Thursday,and Saturday with dialysis. Duration: 6 weeks with start date 04/07/2015.     No current facility-administered medications on file prior to visit.   Active Ambulatory Problems    Diagnosis Date Noted  . End stage renal disease ([place]) 06/30/2013  . Hypertension 03/22/2014  . Acute bronchitis 03/22/2014  . T wave inversion in EKG 03/22/2014  . QT prolongation 03/22/2014  . Mitral valve vegetation 03/23/2014  . Acute combined systolic and diastolic congestive heart failure ([place]) 03/21/2014  . Severe mitral regurgitation 03/23/2014  . 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 Positive blood culture 11/27/2013  . DVT of upper extremity (deep vein thrombosis) ([place]) 03/04/2014  . Knee pain, left 03/21/2014  . Chronic diastolic congestive heart failure ([place])   . Septic embolism to left lower extremity 03/25/2014  . 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 S/P minimally invasive mitral valve repair 04/05/2014  . Paroxysmal atrial fibrillation ([place]) 06/13/2014  . Encounter for therapeutic drug monitoring 06/16/2014  . Weakness of left lower extremity 11/14/2014  . Endocarditis 04/05/2015  . Cavitary lesion of lung 04/06/2015  . Mitral valve replaced 04/06/2015  . Anticoagulant long-term use 04/06/2015  . Anemia in chronic kidney disease  04/06/2015  . Coagulopathy ([place]) 04/06/2015  . Multiple pulmonary nodules   . Renal mass   . Mediastinal adenopathy   . ESRD on dialysis ([place])   . Lung nodules   . 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 Hypokalemia 03/22/2014   Past Medical History  Diagnosis Date  . Peripheral  vascular disease ([place])   . Pneumonia 2014  . ESRD (end stage renal disease) on dialysis [facility])    Social History  Substance Use Topics  . Smoking status: Never Smoker   . Smokeless tobacco: Never Used  . Alcohol Use: No  family history includes Diabetes in his mother; Hypertension in his brother, father, mother, and sister.   Review of Systems  Constitutional: Negative for fever, chills, diaphoresis, activity change, appetite change, fatigue and unexpected weight change.  HENT: Negative for congestion, sore throat, rhinorrhea, sneezing, trouble swallowing and sinus pressure.  Eyes: Negative for photophobia and visual disturbance.  Respiratory: Negative for cough, chest tightness, shortness of breath, wheezing and stridor.  Cardiovascular: Negative for chest pain, palpitations and leg swelling.  Gastrointestinal: Negative for nausea, vomiting, abdominal pain, diarrhea, constipation, blood in stool, abdominal distention and anal bleeding.  Genitourinary: Negative for dysuria, hematuria, flank pain and difficulty urinating.  Musculoskeletal: Negative for myalgias, back pain, joint swelling, arthralgias and gait problem.  Skin: Negative for color change, pallor, rash and wound.  Neurological: Negative for dizziness, tremors, weakness and light-headedness.  Hematological: Negative for adenopathy. Does not bruise/bleed easily.  Psychiatric/Behavioral: Negative for behavioral problems, confusion, sleep disturbance, dysphoric mood, decreased concentration and agitation.       Objective:   Physical Exam BP 125/86 mmHg  Pulse 86  Temp(Src) 97.7 F (36.5 C) (Oral)  Wt 224 lb (101.606 kg) Physical Exam  Constitutional: He is oriented to person, place, and time. He appears well-developed and well-nourished. No distress.  HENT:  Mouth/Throat: Oropharynx is clear and moist. No oropharyngeal exudate.  Cardiovascular: Normal rate, regular rhythm and normal heart sounds. Exam reveals no gallop  and no friction rub.  No murmur heard.  Pulmonary/Chest: Effort normal and breath sounds normal. No respiratory distress. He has no wheezes.  Abdominal: Soft. Bowel sounds are normal. He exhibits no distension. There is no tenderness.  Lymphadenopathy:  He has no cervical adenopathy.  Neurological: He is alert and oriented to person, place, and time.  Skin: Skin is warm and dry. No rash noted. No erythema.  Psychiatric: He has a normal mood and affect. His behavior is normal.          Assessment & Plan:  Continue on vanco and cefazolin for now. He is currently on 4 of 6 wk of PVE treatment. Would recommend to get TEE prior to stopping his abtx. May need to consider chronic suppression since this is 2nd episode  Will reach out to Dr. Aundra Dubin office to schedule TEE  Will arrange with renal unit to continue abtx for 4 wk  Continue with work up of dental source. Has follow up chest CT in December/January  He gets HD at [facility] hd clinic [id number]-thur-sat

## 2015-05-16 NOTE — Progress Notes (Signed)
Quick Note:  Discussed with TP that JN does not have any ov's in the next 4 weeks. Per TP Schedule pt with first available MD in 4 wks. LVM for pt to return call.    ______

## 2015-05-18 DIAGNOSIS — N186 End stage renal disease: Secondary | ICD-10-CM | POA: Diagnosis not present

## 2015-05-18 DIAGNOSIS — I38 Endocarditis, valve unspecified: Secondary | ICD-10-CM | POA: Diagnosis not present

## 2015-05-18 DIAGNOSIS — D509 Iron deficiency anemia, unspecified: Secondary | ICD-10-CM | POA: Diagnosis not present

## 2015-05-18 DIAGNOSIS — D631 Anemia in chronic kidney disease: Secondary | ICD-10-CM | POA: Diagnosis not present

## 2015-05-18 DIAGNOSIS — R7881 Bacteremia: Secondary | ICD-10-CM | POA: Diagnosis not present

## 2015-05-18 DIAGNOSIS — N2581 Secondary hyperparathyroidism of renal origin: Secondary | ICD-10-CM | POA: Diagnosis not present

## 2015-05-19 ENCOUNTER — Ambulatory Visit (HOSPITAL_COMMUNITY)
Admission: RE | Admit: 2015-05-19 | Discharge: 2015-05-19 | Disposition: A | Discharge status: Home / Self Care | Payer: Medicare Other | Source: Ambulatory Visit | Attending: Cardiology | Admitting: Cardiology

## 2015-05-19 VITALS — BP 120/82 | HR 71 | Wt 225.0 lb

## 2015-05-19 DIAGNOSIS — Z79899 Other long term (current) drug therapy: Secondary | ICD-10-CM | POA: Diagnosis not present

## 2015-05-19 DIAGNOSIS — I5032 Chronic diastolic (congestive) heart failure: Secondary | ICD-10-CM | POA: Diagnosis not present

## 2015-05-19 DIAGNOSIS — N186 End stage renal disease: Secondary | ICD-10-CM | POA: Diagnosis not present

## 2015-05-19 DIAGNOSIS — I330 Acute and subacute infective endocarditis: Secondary | ICD-10-CM | POA: Diagnosis not present

## 2015-05-19 DIAGNOSIS — I739 Peripheral vascular disease, unspecified: Secondary | ICD-10-CM | POA: Insufficient documentation

## 2015-05-19 DIAGNOSIS — I38 Endocarditis, valve unspecified: Secondary | ICD-10-CM

## 2015-05-19 DIAGNOSIS — R918 Other nonspecific abnormal finding of lung field: Secondary | ICD-10-CM | POA: Diagnosis not present

## 2015-05-19 DIAGNOSIS — I059 Rheumatic mitral valve disease, unspecified: Secondary | ICD-10-CM | POA: Insufficient documentation

## 2015-05-19 DIAGNOSIS — Z7901 Long term (current) use of anticoagulants: Secondary | ICD-10-CM | POA: Diagnosis not present

## 2015-05-19 DIAGNOSIS — Z86718 Personal history of other venous thrombosis and embolism: Secondary | ICD-10-CM | POA: Diagnosis not present

## 2015-05-19 DIAGNOSIS — Z992 Dependence on renal dialysis: Secondary | ICD-10-CM | POA: Insufficient documentation

## 2015-05-19 DIAGNOSIS — I132 Hypertensive heart and chronic kidney disease with heart failure and with stage 5 chronic kidney disease, or end stage renal disease: Secondary | ICD-10-CM | POA: Insufficient documentation

## 2015-05-19 DIAGNOSIS — Z8249 Family history of ischemic heart disease and other diseases of the circulatory system: Secondary | ICD-10-CM | POA: Insufficient documentation

## 2015-05-19 DIAGNOSIS — Z833 Family history of diabetes mellitus: Secondary | ICD-10-CM | POA: Insufficient documentation

## 2015-05-19 DIAGNOSIS — T826XXD Infection and inflammatory reaction due to cardiac valve prosthesis, subsequent encounter: Secondary | ICD-10-CM

## 2015-05-19 DIAGNOSIS — I480 Paroxysmal atrial fibrillation: Secondary | ICD-10-CM | POA: Diagnosis not present

## 2015-05-19 DIAGNOSIS — I120 Hypertensive chronic kidney disease with stage 5 chronic kidney disease or end stage renal disease: Secondary | ICD-10-CM | POA: Diagnosis present

## 2015-05-19 DIAGNOSIS — A4901 Methicillin susceptible Staphylococcus aureus infection, unspecified site: Secondary | ICD-10-CM | POA: Diagnosis not present

## 2015-05-19 NOTE — Progress Notes (Signed)
Patient ID: Timothy Palmer, male   DOB: 1966-05-29, 49 y.o.   MRN: [id number] PCP: Primary Cardiologist: Dr Aundra Dubin Nephrology: Dr Moshe Cipro ID: Dr Megan Salon  HPI: Timothy Palmer is a 49 year old with ESRD on HD via left arm fistula since 2005, DVT RUE, and HTN. 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   Admitted to [facility] 9/15 with increased dyspnea. He  presented with fever and dyspnea with evidence for CHF. He was found to have severe Timothy (valve perforation) and MV endocarditis. He had minimally invasive MVR. 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  He had left popliteal embolectomy followed by above to below knee popliteal bypass. He has completed his antibiotics.  Post-op, he had atrial fibrillation, but that seems to have resolved.  He is in NSR by exam today.   Admitted with recurrent MSSA bacteremia and endocarditis in 10/16. Started on IV vanc for 6 weeks .  Returns for f/u. Doing well. No fevers or chills. Volume status managed with HD. No problems with PICC site.   TEE 04/13/15: EF 50%. Mitral valve vegetation noted => small, mobile. Does not appear to be compromising valve currently. Trivial Timothy.   ROS: All systems negative except as listed in HPI, PMH and Problem List.  SH:  Social History   Social History  . Marital Status: Single    Spouse Name: N/A  . Number of Children: N/A  . Years of Education: N/A   Occupational History  . Not on file.   Social History Main Topics  . Smoking status: Never Smoker   . Smokeless tobacco: Never Used  . Alcohol Use: No  . Drug Use: Yes    Special: Marijuana  . Sexual Activity: No   Other Topics Concern  . Not on file   Social History Narrative    FH:  Family History  Problem Relation Age of Onset  . Diabetes Mother   . Hypertension Mother   . Hypertension Father   . Hypertension Sister   . Hypertension Brother      Past Medical History  Diagnosis Date  . Hypertension   . Peripheral vascular disease ([place])   . Pneumonia 2014  . ESRD (end stage renal disease) on dialysis ([place])     [place],Dialysis- T,Th,S  . Severe mitral regurgitation 03/23/2014    by TEE  . DVT of upper extremity (deep vein thrombosis) ([place]) 03/04/2014    Right arm  . Acute combined systolic and diastolic congestive heart failure ([place]) 03/21/2014  . Chronic diastolic congestive heart failure ([place])   . 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 Septic embolism to left lower extremity 03/25/2014  . 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 The aneurysm measures 1.4 x 1.1 cm which is  significantly larger than the 4.5 mm parent vessel.   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 amiodarone (PACERONE) 200 MG tablet Take 200 mg by mouth daily.    Marland Kitchen aspirin 81 MG tablet Take 81 mg by mouth daily.    Marland Kitchen ceFAZolin (ANCEF) 2-3 GM-% SOLR Inject 50 mLs (2 g total) into the vein Every Tuesday,Thursday,and Saturday with dialysis. Duration: 6 weeks with start date 04/07/2015.    . cinacalcet (SENSIPAR) 90 MG tablet Take 1 tablet (90 mg total) by mouth 2 (two) times daily.    Marland Kitchen lisinopril (PRINIVIL,ZESTRIL) 20 MG tablet Take 20 mg by mouth daily.    . multivitamin  (RENA-VIT) TABS tablet Take 1 tablet by mouth at bedtime. 30 tablet 0  . Nutritional Supplements (FEEDING SUPPLEMENT, NEPRO CARB STEADY,) LIQD Take 237 mLs by mouth 2 (two) times daily between meals.    . sevelamer carbonate (RENVELA) 800 MG tablet Take 3 tablets (2,400 mg total) by mouth 3 (three) times daily with meals.    . vancomycin (VANCOCIN) 1 GM/200ML SOLN Inject 200 mLs (1,000 mg total) into the vein Every Tuesday,Thursday,and Saturday with dialysis. Duration: 6 weeks with start date 04/07/2015.    . warfarin (COUMADIN) 5 MG tablet Take 7.5-10 mg by mouth as directed. Take 10 mg (2 tablets) daily except 7.5 mg (1 and 1/2 tablet) on Sunday     No current facility-administered medications for this encounter.    Filed Vitals:   05/19/15 0944  BP: 120/82  Pulse: 71  Weight: 225 lb (102.059 kg)  SpO2: 100%    PHYSICAL EXAM:  General:  Well appearing. No resp difficulty HEENT: normal Neck: supple. JVP 8 cm. Carotids 2+ bilaterally; no bruits. No lymphadenopathy or thryomegaly appreciated. Cor: PMI normal. Regular rate & rhythm. No rubs, gallops.  2/6 early SEM RUSB.  Lungs: clear Abdomen: soft, nontender, nondistended. No hepatosplenomegaly. No bruits or masses. Good bowel sounds. Extremities: no cyanosis, clubbing, rash, edema. LUE fistula. LLE trace edema. No edema RLE.  Neuro: alert & orientedx3, cranial nerves grossly intact. Moves all 4 extremities w/o difficulty. Affect pleasant.  ASSESSMENT & PLAN: 49  yo with ESRD on HD via left arm fistula and HTN presented with fever and dyspnea with evidence for CHF. He was found to have severe Timothy and MV endocarditis. He subsequently had minimally invasive MVR in 9/15. Recurrent endocarditis 10/16 1. Mitral valve MSSA endocarditis with perforation and severe Timothy: s/p minimally Invasive MV repair 04/05/14.  --Recurrent MSSA bacteremia in 10/16. With small vegetation on MV. No significant valve compromise. On IV vancomycin x 6 weeks. --Has  f/u with ID. Will need to decide if he needs suppressive abx lifelong.  2. Chronic diastolic CHF: Volume controlled by HD.  3. S/p above knee to below knee popliteal bypass in setting of mycotic aneurysm.  To followup with vascular surgery.  4. Atrial fibrillation: Peri-operative only. He has been in NSR since. Can stop amio and coumadin.Start ASA 81 daily.  Will check LFTs/TSH today. 5. HTN: Controlled 6/ Pulmonary nodules: felt to be embolic. Pulmonary following.   Follow up 6 months    Amy Clegg 05/19/2015  Patient seen and examined with Darrick Grinder, NP. We discussed all aspects of the encounter. I agree with the assessment and plan as stated above.   Doing well. Back on Vancomycin for recurrent MSSA endocarditis. Will need to decide about lifelong suppressive therapy. Volume status ok on HD. Can stop amio and coumadin.   Celinda Dethlefs,MD 2:52 PM

## 2015-05-19 NOTE — Patient Instructions (Signed)
Follow up in 6 months with Dr Aundra Dubin.    Please follow up with Dr Baxter Flattery- Infectious Disease.

## 2015-05-19 NOTE — Progress Notes (Signed)
Advanced Heart Failure Medication Review by a Pharmacist  Does the patient  feel that his/her medications are working for him/her?  yes  Has the patient been experiencing any side effects to the medications prescribed?  no  Does the patient measure his/her own blood pressure or blood glucose at home?  no   Does the patient have any problems obtaining medications due to transportation or finances?   no  Understanding of regimen: good Understanding of indications: good Potential of compliance: good Patient understands to avoid NSAIDs. Patient understands to avoid decongestants.  Issues to address at subsequent visits: None   Pharmacist comments:  Timothy Palmer is a pleasant 49 yo M presenting without a medication list but with a great understanding of his regimen including dosages. He reports good compliance with his medications admitting to missing 1 dose per week. He did not have any specific medication-related questions or concerns at this time.   Ruta Hinds. Velva Harman, PharmD, BCPS, CPP Clinical Pharmacist Pager: [phone] Phone: [phone] 05/19/2015 9:57 AM      Time with patient: 4 minutes Preparation and documentation time: 2 minutes Total time: 6 minutes

## 2015-05-20 DIAGNOSIS — D631 Anemia in chronic kidney disease: Secondary | ICD-10-CM | POA: Diagnosis not present

## 2015-05-20 DIAGNOSIS — I38 Endocarditis, valve unspecified: Secondary | ICD-10-CM | POA: Diagnosis not present

## 2015-05-20 DIAGNOSIS — N186 End stage renal disease: Secondary | ICD-10-CM | POA: Diagnosis not present

## 2015-05-20 DIAGNOSIS — N2581 Secondary hyperparathyroidism of renal origin: Secondary | ICD-10-CM | POA: Diagnosis not present

## 2015-05-20 DIAGNOSIS — D509 Iron deficiency anemia, unspecified: Secondary | ICD-10-CM | POA: Diagnosis not present

## 2015-05-21 LAB — AFB CULTURE WITH SMEAR (NOT AT ARMC): ACID FAST SMEAR: NONE SEEN

## 2015-05-23 ENCOUNTER — Ambulatory Visit: Payer: Self-pay | Admitting: Internal Medicine

## 2015-05-23 DIAGNOSIS — D509 Iron deficiency anemia, unspecified: Secondary | ICD-10-CM | POA: Diagnosis not present

## 2015-05-23 DIAGNOSIS — I38 Endocarditis, valve unspecified: Secondary | ICD-10-CM | POA: Diagnosis not present

## 2015-05-23 DIAGNOSIS — N2581 Secondary hyperparathyroidism of renal origin: Secondary | ICD-10-CM | POA: Diagnosis not present

## 2015-05-23 DIAGNOSIS — Z5181 Encounter for therapeutic drug level monitoring: Secondary | ICD-10-CM

## 2015-05-23 DIAGNOSIS — N186 End stage renal disease: Secondary | ICD-10-CM | POA: Diagnosis not present

## 2015-05-23 DIAGNOSIS — D631 Anemia in chronic kidney disease: Secondary | ICD-10-CM | POA: Diagnosis not present

## 2015-05-23 DIAGNOSIS — I480 Paroxysmal atrial fibrillation: Secondary | ICD-10-CM

## 2015-05-23 DIAGNOSIS — I76 Septic arterial embolism: Secondary | ICD-10-CM

## 2015-05-23 LAB — AFB CULTURE WITH SMEAR (NOT AT ARMC): Acid Fast Smear: NONE SEEN

## 2015-05-24 ENCOUNTER — Encounter: Payer: Self-pay | Admitting: *Deleted

## 2015-05-24 NOTE — Progress Notes (Signed)
Quick Note:  lmtcb ______ 

## 2015-05-24 NOTE — Progress Notes (Signed)
Quick Note:  LMTCB and mailed pt letter to call for results and to schedule f.u ______

## 2015-05-25 DIAGNOSIS — I481 Persistent atrial fibrillation: Secondary | ICD-10-CM | POA: Diagnosis not present

## 2015-05-25 DIAGNOSIS — Z7901 Long term (current) use of anticoagulants: Secondary | ICD-10-CM | POA: Diagnosis not present

## 2015-05-25 DIAGNOSIS — Z5181 Encounter for therapeutic drug level monitoring: Secondary | ICD-10-CM | POA: Diagnosis not present

## 2015-05-25 DIAGNOSIS — D631 Anemia in chronic kidney disease: Secondary | ICD-10-CM | POA: Diagnosis not present

## 2015-05-25 DIAGNOSIS — D509 Iron deficiency anemia, unspecified: Secondary | ICD-10-CM | POA: Diagnosis not present

## 2015-05-25 DIAGNOSIS — N2581 Secondary hyperparathyroidism of renal origin: Secondary | ICD-10-CM | POA: Diagnosis not present

## 2015-05-25 DIAGNOSIS — I38 Endocarditis, valve unspecified: Secondary | ICD-10-CM | POA: Diagnosis not present

## 2015-05-25 DIAGNOSIS — N186 End stage renal disease: Secondary | ICD-10-CM | POA: Diagnosis not present

## 2015-05-25 LAB — AFB CULTURE WITH SMEAR (NOT AT ARMC): Acid Fast Smear: NONE SEEN

## 2015-05-27 DIAGNOSIS — N186 End stage renal disease: Secondary | ICD-10-CM | POA: Diagnosis not present

## 2015-05-27 DIAGNOSIS — N2581 Secondary hyperparathyroidism of renal origin: Secondary | ICD-10-CM | POA: Diagnosis not present

## 2015-05-27 DIAGNOSIS — D631 Anemia in chronic kidney disease: Secondary | ICD-10-CM | POA: Diagnosis not present

## 2015-05-27 DIAGNOSIS — I38 Endocarditis, valve unspecified: Secondary | ICD-10-CM | POA: Diagnosis not present

## 2015-05-27 DIAGNOSIS — D509 Iron deficiency anemia, unspecified: Secondary | ICD-10-CM | POA: Diagnosis not present

## 2015-05-30 DIAGNOSIS — N2581 Secondary hyperparathyroidism of renal origin: Secondary | ICD-10-CM | POA: Diagnosis not present

## 2015-05-30 DIAGNOSIS — D509 Iron deficiency anemia, unspecified: Secondary | ICD-10-CM | POA: Diagnosis not present

## 2015-05-30 DIAGNOSIS — D631 Anemia in chronic kidney disease: Secondary | ICD-10-CM | POA: Diagnosis not present

## 2015-05-30 DIAGNOSIS — I38 Endocarditis, valve unspecified: Secondary | ICD-10-CM | POA: Diagnosis not present

## 2015-05-30 DIAGNOSIS — N186 End stage renal disease: Secondary | ICD-10-CM | POA: Diagnosis not present

## 2015-06-02 DIAGNOSIS — N186 End stage renal disease: Secondary | ICD-10-CM | POA: Diagnosis not present

## 2015-06-02 DIAGNOSIS — I38 Endocarditis, valve unspecified: Secondary | ICD-10-CM | POA: Diagnosis not present

## 2015-06-02 DIAGNOSIS — D631 Anemia in chronic kidney disease: Secondary | ICD-10-CM | POA: Diagnosis not present

## 2015-06-02 DIAGNOSIS — D509 Iron deficiency anemia, unspecified: Secondary | ICD-10-CM | POA: Diagnosis not present

## 2015-06-02 DIAGNOSIS — N2581 Secondary hyperparathyroidism of renal origin: Secondary | ICD-10-CM | POA: Diagnosis not present

## 2015-06-04 DIAGNOSIS — D631 Anemia in chronic kidney disease: Secondary | ICD-10-CM | POA: Diagnosis not present

## 2015-06-04 DIAGNOSIS — I38 Endocarditis, valve unspecified: Secondary | ICD-10-CM | POA: Diagnosis not present

## 2015-06-04 DIAGNOSIS — N186 End stage renal disease: Secondary | ICD-10-CM | POA: Diagnosis not present

## 2015-06-04 DIAGNOSIS — D509 Iron deficiency anemia, unspecified: Secondary | ICD-10-CM | POA: Diagnosis not present

## 2015-06-04 DIAGNOSIS — N2581 Secondary hyperparathyroidism of renal origin: Secondary | ICD-10-CM | POA: Diagnosis not present

## 2015-06-06 ENCOUNTER — Ambulatory Visit: Payer: Medicare Other | Admitting: Internal Medicine

## 2015-06-06 DIAGNOSIS — I38 Endocarditis, valve unspecified: Secondary | ICD-10-CM | POA: Diagnosis not present

## 2015-06-06 DIAGNOSIS — D631 Anemia in chronic kidney disease: Secondary | ICD-10-CM | POA: Diagnosis not present

## 2015-06-06 DIAGNOSIS — D509 Iron deficiency anemia, unspecified: Secondary | ICD-10-CM | POA: Diagnosis not present

## 2015-06-06 DIAGNOSIS — N186 End stage renal disease: Secondary | ICD-10-CM | POA: Diagnosis not present

## 2015-06-06 DIAGNOSIS — N2581 Secondary hyperparathyroidism of renal origin: Secondary | ICD-10-CM | POA: Diagnosis not present

## 2015-06-07 DIAGNOSIS — I120 Hypertensive chronic kidney disease with stage 5 chronic kidney disease or end stage renal disease: Secondary | ICD-10-CM | POA: Diagnosis not present

## 2015-06-07 DIAGNOSIS — Z992 Dependence on renal dialysis: Secondary | ICD-10-CM | POA: Diagnosis not present

## 2015-06-07 DIAGNOSIS — N186 End stage renal disease: Secondary | ICD-10-CM | POA: Diagnosis not present

## 2015-06-07 NOTE — Progress Notes (Signed)
Quick Note:  lmtcb for pt. ______ 

## 2015-06-08 DIAGNOSIS — I38 Endocarditis, valve unspecified: Secondary | ICD-10-CM | POA: Diagnosis not present

## 2015-06-08 DIAGNOSIS — N2581 Secondary hyperparathyroidism of renal origin: Secondary | ICD-10-CM | POA: Diagnosis not present

## 2015-06-08 DIAGNOSIS — R509 Fever, unspecified: Secondary | ICD-10-CM | POA: Diagnosis not present

## 2015-06-08 DIAGNOSIS — D509 Iron deficiency anemia, unspecified: Secondary | ICD-10-CM | POA: Diagnosis not present

## 2015-06-08 DIAGNOSIS — R7881 Bacteremia: Secondary | ICD-10-CM | POA: Diagnosis not present

## 2015-06-08 DIAGNOSIS — N186 End stage renal disease: Secondary | ICD-10-CM | POA: Diagnosis not present

## 2015-06-09 ENCOUNTER — Encounter: Payer: Self-pay | Admitting: *Deleted

## 2015-06-10 DIAGNOSIS — R509 Fever, unspecified: Secondary | ICD-10-CM | POA: Diagnosis not present

## 2015-06-10 DIAGNOSIS — D509 Iron deficiency anemia, unspecified: Secondary | ICD-10-CM | POA: Diagnosis not present

## 2015-06-10 DIAGNOSIS — N2581 Secondary hyperparathyroidism of renal origin: Secondary | ICD-10-CM | POA: Diagnosis not present

## 2015-06-10 DIAGNOSIS — N186 End stage renal disease: Secondary | ICD-10-CM | POA: Diagnosis not present

## 2015-06-10 DIAGNOSIS — I38 Endocarditis, valve unspecified: Secondary | ICD-10-CM | POA: Diagnosis not present

## 2015-06-13 DIAGNOSIS — D509 Iron deficiency anemia, unspecified: Secondary | ICD-10-CM | POA: Diagnosis not present

## 2015-06-13 DIAGNOSIS — N186 End stage renal disease: Secondary | ICD-10-CM | POA: Diagnosis not present

## 2015-06-13 DIAGNOSIS — R509 Fever, unspecified: Secondary | ICD-10-CM | POA: Diagnosis not present

## 2015-06-13 DIAGNOSIS — I38 Endocarditis, valve unspecified: Secondary | ICD-10-CM | POA: Diagnosis not present

## 2015-06-13 DIAGNOSIS — N2581 Secondary hyperparathyroidism of renal origin: Secondary | ICD-10-CM | POA: Diagnosis not present

## 2015-06-15 DIAGNOSIS — N2581 Secondary hyperparathyroidism of renal origin: Secondary | ICD-10-CM | POA: Diagnosis not present

## 2015-06-15 DIAGNOSIS — N186 End stage renal disease: Secondary | ICD-10-CM | POA: Diagnosis not present

## 2015-06-15 DIAGNOSIS — D509 Iron deficiency anemia, unspecified: Secondary | ICD-10-CM | POA: Diagnosis not present

## 2015-06-15 DIAGNOSIS — I38 Endocarditis, valve unspecified: Secondary | ICD-10-CM | POA: Diagnosis not present

## 2015-06-15 DIAGNOSIS — R509 Fever, unspecified: Secondary | ICD-10-CM | POA: Diagnosis not present

## 2015-06-17 DIAGNOSIS — I38 Endocarditis, valve unspecified: Secondary | ICD-10-CM | POA: Diagnosis not present

## 2015-06-17 DIAGNOSIS — R509 Fever, unspecified: Secondary | ICD-10-CM | POA: Diagnosis not present

## 2015-06-17 DIAGNOSIS — D509 Iron deficiency anemia, unspecified: Secondary | ICD-10-CM | POA: Diagnosis not present

## 2015-06-17 DIAGNOSIS — N2581 Secondary hyperparathyroidism of renal origin: Secondary | ICD-10-CM | POA: Diagnosis not present

## 2015-06-17 DIAGNOSIS — N186 End stage renal disease: Secondary | ICD-10-CM | POA: Diagnosis not present

## 2015-06-20 DIAGNOSIS — N186 End stage renal disease: Secondary | ICD-10-CM | POA: Diagnosis not present

## 2015-06-20 DIAGNOSIS — N2581 Secondary hyperparathyroidism of renal origin: Secondary | ICD-10-CM | POA: Diagnosis not present

## 2015-06-20 DIAGNOSIS — I38 Endocarditis, valve unspecified: Secondary | ICD-10-CM | POA: Diagnosis not present

## 2015-06-20 DIAGNOSIS — R509 Fever, unspecified: Secondary | ICD-10-CM | POA: Diagnosis not present

## 2015-06-20 DIAGNOSIS — D509 Iron deficiency anemia, unspecified: Secondary | ICD-10-CM | POA: Diagnosis not present

## 2015-06-22 DIAGNOSIS — N2581 Secondary hyperparathyroidism of renal origin: Secondary | ICD-10-CM | POA: Diagnosis not present

## 2015-06-22 DIAGNOSIS — I38 Endocarditis, valve unspecified: Secondary | ICD-10-CM | POA: Diagnosis not present

## 2015-06-22 DIAGNOSIS — R509 Fever, unspecified: Secondary | ICD-10-CM | POA: Diagnosis not present

## 2015-06-22 DIAGNOSIS — N186 End stage renal disease: Secondary | ICD-10-CM | POA: Diagnosis not present

## 2015-06-22 DIAGNOSIS — D509 Iron deficiency anemia, unspecified: Secondary | ICD-10-CM | POA: Diagnosis not present

## 2015-06-24 DIAGNOSIS — D509 Iron deficiency anemia, unspecified: Secondary | ICD-10-CM | POA: Diagnosis not present

## 2015-06-24 DIAGNOSIS — N186 End stage renal disease: Secondary | ICD-10-CM | POA: Diagnosis not present

## 2015-06-24 DIAGNOSIS — R509 Fever, unspecified: Secondary | ICD-10-CM | POA: Diagnosis not present

## 2015-06-24 DIAGNOSIS — I38 Endocarditis, valve unspecified: Secondary | ICD-10-CM | POA: Diagnosis not present

## 2015-06-24 DIAGNOSIS — N2581 Secondary hyperparathyroidism of renal origin: Secondary | ICD-10-CM | POA: Diagnosis not present

## 2015-06-27 DIAGNOSIS — D509 Iron deficiency anemia, unspecified: Secondary | ICD-10-CM | POA: Diagnosis not present

## 2015-06-27 DIAGNOSIS — R509 Fever, unspecified: Secondary | ICD-10-CM | POA: Diagnosis not present

## 2015-06-27 DIAGNOSIS — N186 End stage renal disease: Secondary | ICD-10-CM | POA: Diagnosis not present

## 2015-06-27 DIAGNOSIS — N2581 Secondary hyperparathyroidism of renal origin: Secondary | ICD-10-CM | POA: Diagnosis not present

## 2015-06-27 DIAGNOSIS — I38 Endocarditis, valve unspecified: Secondary | ICD-10-CM | POA: Diagnosis not present

## 2015-06-29 DIAGNOSIS — Z7901 Long term (current) use of anticoagulants: Secondary | ICD-10-CM | POA: Diagnosis not present

## 2015-06-29 DIAGNOSIS — R509 Fever, unspecified: Secondary | ICD-10-CM | POA: Diagnosis not present

## 2015-06-29 DIAGNOSIS — N186 End stage renal disease: Secondary | ICD-10-CM | POA: Diagnosis not present

## 2015-06-29 DIAGNOSIS — I38 Endocarditis, valve unspecified: Secondary | ICD-10-CM | POA: Diagnosis not present

## 2015-06-29 DIAGNOSIS — I481 Persistent atrial fibrillation: Secondary | ICD-10-CM | POA: Diagnosis not present

## 2015-06-29 DIAGNOSIS — D509 Iron deficiency anemia, unspecified: Secondary | ICD-10-CM | POA: Diagnosis not present

## 2015-06-29 DIAGNOSIS — Z5181 Encounter for therapeutic drug level monitoring: Secondary | ICD-10-CM | POA: Diagnosis not present

## 2015-06-29 DIAGNOSIS — N2581 Secondary hyperparathyroidism of renal origin: Secondary | ICD-10-CM | POA: Diagnosis not present

## 2015-07-01 DIAGNOSIS — I38 Endocarditis, valve unspecified: Secondary | ICD-10-CM | POA: Diagnosis not present

## 2015-07-01 DIAGNOSIS — R509 Fever, unspecified: Secondary | ICD-10-CM | POA: Diagnosis not present

## 2015-07-01 DIAGNOSIS — N2581 Secondary hyperparathyroidism of renal origin: Secondary | ICD-10-CM | POA: Diagnosis not present

## 2015-07-01 DIAGNOSIS — N186 End stage renal disease: Secondary | ICD-10-CM | POA: Diagnosis not present

## 2015-07-01 DIAGNOSIS — D509 Iron deficiency anemia, unspecified: Secondary | ICD-10-CM | POA: Diagnosis not present

## 2015-07-04 DIAGNOSIS — N2581 Secondary hyperparathyroidism of renal origin: Secondary | ICD-10-CM | POA: Diagnosis not present

## 2015-07-04 DIAGNOSIS — R509 Fever, unspecified: Secondary | ICD-10-CM | POA: Diagnosis not present

## 2015-07-04 DIAGNOSIS — D509 Iron deficiency anemia, unspecified: Secondary | ICD-10-CM | POA: Diagnosis not present

## 2015-07-04 DIAGNOSIS — N186 End stage renal disease: Secondary | ICD-10-CM | POA: Diagnosis not present

## 2015-07-04 DIAGNOSIS — I38 Endocarditis, valve unspecified: Secondary | ICD-10-CM | POA: Diagnosis not present

## 2015-07-06 DIAGNOSIS — D509 Iron deficiency anemia, unspecified: Secondary | ICD-10-CM | POA: Diagnosis not present

## 2015-07-06 DIAGNOSIS — N2581 Secondary hyperparathyroidism of renal origin: Secondary | ICD-10-CM | POA: Diagnosis not present

## 2015-07-06 DIAGNOSIS — I38 Endocarditis, valve unspecified: Secondary | ICD-10-CM | POA: Diagnosis not present

## 2015-07-06 DIAGNOSIS — R509 Fever, unspecified: Secondary | ICD-10-CM | POA: Diagnosis not present

## 2015-07-06 DIAGNOSIS — N186 End stage renal disease: Secondary | ICD-10-CM | POA: Diagnosis not present

## 2015-07-06 DIAGNOSIS — R7881 Bacteremia: Secondary | ICD-10-CM | POA: Diagnosis not present

## 2015-07-08 DIAGNOSIS — D509 Iron deficiency anemia, unspecified: Secondary | ICD-10-CM | POA: Diagnosis not present

## 2015-07-08 DIAGNOSIS — R509 Fever, unspecified: Secondary | ICD-10-CM | POA: Diagnosis not present

## 2015-07-08 DIAGNOSIS — I38 Endocarditis, valve unspecified: Secondary | ICD-10-CM | POA: Diagnosis not present

## 2015-07-08 DIAGNOSIS — N2581 Secondary hyperparathyroidism of renal origin: Secondary | ICD-10-CM | POA: Diagnosis not present

## 2015-07-08 DIAGNOSIS — Z992 Dependence on renal dialysis: Secondary | ICD-10-CM | POA: Diagnosis not present

## 2015-07-08 DIAGNOSIS — N186 End stage renal disease: Secondary | ICD-10-CM | POA: Diagnosis not present

## 2015-07-08 DIAGNOSIS — I120 Hypertensive chronic kidney disease with stage 5 chronic kidney disease or end stage renal disease: Secondary | ICD-10-CM | POA: Diagnosis not present

## 2015-07-11 DIAGNOSIS — N186 End stage renal disease: Secondary | ICD-10-CM | POA: Diagnosis not present

## 2015-07-11 DIAGNOSIS — N2581 Secondary hyperparathyroidism of renal origin: Secondary | ICD-10-CM | POA: Diagnosis not present

## 2015-07-11 DIAGNOSIS — D631 Anemia in chronic kidney disease: Secondary | ICD-10-CM | POA: Diagnosis not present

## 2015-07-11 DIAGNOSIS — D509 Iron deficiency anemia, unspecified: Secondary | ICD-10-CM | POA: Diagnosis not present

## 2015-07-11 DIAGNOSIS — Z992 Dependence on renal dialysis: Secondary | ICD-10-CM | POA: Diagnosis not present

## 2015-07-13 DIAGNOSIS — D631 Anemia in chronic kidney disease: Secondary | ICD-10-CM | POA: Diagnosis not present

## 2015-07-13 DIAGNOSIS — N2581 Secondary hyperparathyroidism of renal origin: Secondary | ICD-10-CM | POA: Diagnosis not present

## 2015-07-13 DIAGNOSIS — I38 Endocarditis, valve unspecified: Secondary | ICD-10-CM | POA: Diagnosis not present

## 2015-07-13 DIAGNOSIS — D509 Iron deficiency anemia, unspecified: Secondary | ICD-10-CM | POA: Diagnosis not present

## 2015-07-13 DIAGNOSIS — N186 End stage renal disease: Secondary | ICD-10-CM | POA: Diagnosis not present

## 2015-07-13 DIAGNOSIS — Z992 Dependence on renal dialysis: Secondary | ICD-10-CM | POA: Diagnosis not present

## 2015-07-14 DIAGNOSIS — N186 End stage renal disease: Secondary | ICD-10-CM | POA: Diagnosis not present

## 2015-07-14 DIAGNOSIS — I771 Stricture of artery: Secondary | ICD-10-CM | POA: Diagnosis not present

## 2015-07-14 DIAGNOSIS — Z992 Dependence on renal dialysis: Secondary | ICD-10-CM | POA: Diagnosis not present

## 2015-07-14 DIAGNOSIS — T82858D Stenosis of vascular prosthetic devices, implants and grafts, subsequent encounter: Secondary | ICD-10-CM | POA: Diagnosis not present

## 2015-07-15 DIAGNOSIS — Z992 Dependence on renal dialysis: Secondary | ICD-10-CM | POA: Diagnosis not present

## 2015-07-15 DIAGNOSIS — D509 Iron deficiency anemia, unspecified: Secondary | ICD-10-CM | POA: Diagnosis not present

## 2015-07-15 DIAGNOSIS — N186 End stage renal disease: Secondary | ICD-10-CM | POA: Diagnosis not present

## 2015-07-15 DIAGNOSIS — N2581 Secondary hyperparathyroidism of renal origin: Secondary | ICD-10-CM | POA: Diagnosis not present

## 2015-07-15 DIAGNOSIS — D631 Anemia in chronic kidney disease: Secondary | ICD-10-CM | POA: Diagnosis not present

## 2015-07-18 DIAGNOSIS — Z992 Dependence on renal dialysis: Secondary | ICD-10-CM | POA: Diagnosis not present

## 2015-07-18 DIAGNOSIS — D631 Anemia in chronic kidney disease: Secondary | ICD-10-CM | POA: Diagnosis not present

## 2015-07-18 DIAGNOSIS — N186 End stage renal disease: Secondary | ICD-10-CM | POA: Diagnosis not present

## 2015-07-18 DIAGNOSIS — N2581 Secondary hyperparathyroidism of renal origin: Secondary | ICD-10-CM | POA: Diagnosis not present

## 2015-07-18 DIAGNOSIS — D509 Iron deficiency anemia, unspecified: Secondary | ICD-10-CM | POA: Diagnosis not present

## 2015-07-20 DIAGNOSIS — I38 Endocarditis, valve unspecified: Secondary | ICD-10-CM | POA: Diagnosis not present

## 2015-07-20 DIAGNOSIS — N186 End stage renal disease: Secondary | ICD-10-CM | POA: Diagnosis not present

## 2015-07-20 DIAGNOSIS — D509 Iron deficiency anemia, unspecified: Secondary | ICD-10-CM | POA: Diagnosis not present

## 2015-07-20 DIAGNOSIS — Z992 Dependence on renal dialysis: Secondary | ICD-10-CM | POA: Diagnosis not present

## 2015-07-20 DIAGNOSIS — D631 Anemia in chronic kidney disease: Secondary | ICD-10-CM | POA: Diagnosis not present

## 2015-07-20 DIAGNOSIS — N2581 Secondary hyperparathyroidism of renal origin: Secondary | ICD-10-CM | POA: Diagnosis not present

## 2015-07-22 DIAGNOSIS — Z992 Dependence on renal dialysis: Secondary | ICD-10-CM | POA: Diagnosis not present

## 2015-07-22 DIAGNOSIS — N2581 Secondary hyperparathyroidism of renal origin: Secondary | ICD-10-CM | POA: Diagnosis not present

## 2015-07-22 DIAGNOSIS — D509 Iron deficiency anemia, unspecified: Secondary | ICD-10-CM | POA: Diagnosis not present

## 2015-07-22 DIAGNOSIS — D631 Anemia in chronic kidney disease: Secondary | ICD-10-CM | POA: Diagnosis not present

## 2015-07-22 DIAGNOSIS — N186 End stage renal disease: Secondary | ICD-10-CM | POA: Diagnosis not present

## 2015-07-24 ENCOUNTER — Encounter: Payer: Self-pay | Admitting: Thoracic Surgery (Cardiothoracic Vascular Surgery)

## 2015-07-24 ENCOUNTER — Ambulatory Visit (INDEPENDENT_AMBULATORY_CARE_PROVIDER_SITE_OTHER): Payer: Medicare Other | Admitting: Thoracic Surgery (Cardiothoracic Vascular Surgery)

## 2015-07-24 ENCOUNTER — Other Ambulatory Visit: Payer: Self-pay | Admitting: *Deleted

## 2015-07-24 VITALS — BP 114/77 | HR 96 | Resp 20 | Ht 71.0 in | Wt 225.0 lb

## 2015-07-24 DIAGNOSIS — Z9889 Other specified postprocedural states: Secondary | ICD-10-CM

## 2015-07-24 DIAGNOSIS — I330 Acute and subacute infective endocarditis: Secondary | ICD-10-CM

## 2015-07-24 DIAGNOSIS — I38 Endocarditis, valve unspecified: Secondary | ICD-10-CM

## 2015-07-24 DIAGNOSIS — T826XXD Infection and inflammatory reaction due to cardiac valve prosthesis, subsequent encounter: Secondary | ICD-10-CM | POA: Diagnosis not present

## 2015-07-24 NOTE — Patient Instructions (Signed)
Continue all previous medications without any changes at this time  

## 2015-07-24 NOTE — Progress Notes (Signed)
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 Prior to his mitral valve repair he underwent left above-knee to below-knee popliteal bypass using reversed greater saphenous vein graft by Dr. Donnetta Hutching for left lower extremity ischemia secondary to septic embolization to the popliteal artery.  He did remarkably well until 03/19/2015 when he was hospitalized with numerous medical problems including septic emboli to the lung. Transesophageal echocardiogram revealed a mobile vegetation adherent to the atrial surface of the anterior leaflet of the patient's repaired mitral valve, consistent with actual endocarditis. Cultures obtained during that hospitalization were all negative.  He was treated with a 6 week course of intravenous vancomycin and Rocephin. He was last seen in follow-up in the infectious disease clinic by Dr. Graylon Good on 05/16/2015 at which time he was still on antibiotics. Plans were made for the patient undergo follow-up transesophageal echocardiogram prior to stopping antibiotics. This did not occur for unclear reasons, although the patient admits that he may have missed a few doctors appointments. He returns for office today for follow-up. He denies any fevers chills arthralgias or night sweats.  He denies any symptoms of exertional shortness of breath. He complains that he gets severe burning pain in the calf muscles of both lower legs  with ambulation. This is considerably worse on the right side than the left. He states that when the pain comes on he has to stop for at least 10 minutes to allow the pain to subside. Pain does not occur at rest.  He has not had headaches or transient visual disturbances. He has not had problems with routine dialysis treatments. He complains that he has lost a lot of weight and strength. Appetite is okay.    Current Outpatient Prescriptions  Medication Sig Dispense Refill  . aspirin 81 MG tablet Take 81 mg by mouth daily.    . cinacalcet (SENSIPAR) 90 MG tablet Take 1 tablet (90 mg total) by mouth 2 (two) times daily.    Marland Kitchen lisinopril (PRINIVIL,ZESTRIL) 20 MG tablet Take 20 mg by mouth daily.    . multivitamin (RENA-VIT) TABS tablet Take 1 tablet by mouth at bedtime. 30 tablet 0  . Nutritional Supplements (FEEDING SUPPLEMENT, NEPRO CARB STEADY,) LIQD Take 237 mLs by mouth 2 (two) times daily between meals.     No current facility-administered medications for this visit.      Physical Exam:   BP 114/77 mmHg  Pulse 96  Resp 20  Ht 5\' 11"  (1.803 m)  Wt 225 lb (102.059 kg)  BMI 31.39 kg/m2  SpO2 95%  General:  Well-appearing  Chest:   Clear to auscultation  CV:   Regular rate and rhythm without murmur  Incisions:  n/a  Abdomen:  Soft and nontender  Extremities:  Warm and well perfused. Distal pulses are not palpable in either lower leg at the ankle  Diagnostic Tests:  n/a   Impression:  Patient appears clinically stable and without any definite  signs or symptoms to suggest persistent prosthetic valve endocarditis. He has not undergone follow-up transesophageal echocardiogram as previously planned and he has now been off of antibiotics for several weeks. He does complain of bilateral calf claudication, right greater than left.    Plan:  We will obtain follow-up blood cultures today and attempt to make arrangements for the patient undergo follow-up transesophageal echocardiogram  as soon as practical. The patient will need to be seen in follow-up in the infectious disease clinic. We will also refer the patient to Dr[facility]ching [facility] for [facility] extremity duplex examination with ABIs.  We will defer to Dr. Donnetta Hutching discretion whether not the patient needs to undergo CT angiography or arteriogram with runoff.   I spent in excess of 15 minutes during the conduct of this office consultation and >50% of this time involved direct face-to-face encounter with the patient for counseling and/or coordination of their care.    Valentina Gu. Roxy Manns, MD 07/24/2015 2:28 PM

## 2015-07-25 DIAGNOSIS — D631 Anemia in chronic kidney disease: Secondary | ICD-10-CM | POA: Diagnosis not present

## 2015-07-25 DIAGNOSIS — D509 Iron deficiency anemia, unspecified: Secondary | ICD-10-CM | POA: Diagnosis not present

## 2015-07-25 DIAGNOSIS — N2581 Secondary hyperparathyroidism of renal origin: Secondary | ICD-10-CM | POA: Diagnosis not present

## 2015-07-25 DIAGNOSIS — Z992 Dependence on renal dialysis: Secondary | ICD-10-CM | POA: Diagnosis not present

## 2015-07-25 DIAGNOSIS — N186 End stage renal disease: Secondary | ICD-10-CM | POA: Diagnosis not present

## 2015-07-27 DIAGNOSIS — N2581 Secondary hyperparathyroidism of renal origin: Secondary | ICD-10-CM | POA: Diagnosis not present

## 2015-07-27 DIAGNOSIS — N186 End stage renal disease: Secondary | ICD-10-CM | POA: Diagnosis not present

## 2015-07-27 DIAGNOSIS — Z992 Dependence on renal dialysis: Secondary | ICD-10-CM | POA: Diagnosis not present

## 2015-07-27 DIAGNOSIS — D509 Iron deficiency anemia, unspecified: Secondary | ICD-10-CM | POA: Diagnosis not present

## 2015-07-27 DIAGNOSIS — D631 Anemia in chronic kidney disease: Secondary | ICD-10-CM | POA: Diagnosis not present

## 2015-07-28 ENCOUNTER — Telehealth (HOSPITAL_COMMUNITY): Payer: Self-pay | Admitting: Cardiology

## 2015-07-28 NOTE — Telephone Encounter (Signed)
Received message that patient will need TEE per Dr.McLean  Left message for patient to return call, ?scheduling conflicts, ?day preference etc

## 2015-07-29 DIAGNOSIS — N186 End stage renal disease: Secondary | ICD-10-CM | POA: Diagnosis not present

## 2015-07-29 DIAGNOSIS — N2581 Secondary hyperparathyroidism of renal origin: Secondary | ICD-10-CM | POA: Diagnosis not present

## 2015-07-29 DIAGNOSIS — D509 Iron deficiency anemia, unspecified: Secondary | ICD-10-CM | POA: Diagnosis not present

## 2015-07-29 DIAGNOSIS — D631 Anemia in chronic kidney disease: Secondary | ICD-10-CM | POA: Diagnosis not present

## 2015-07-29 DIAGNOSIS — Z992 Dependence on renal dialysis: Secondary | ICD-10-CM | POA: Diagnosis not present

## 2015-08-01 DIAGNOSIS — D509 Iron deficiency anemia, unspecified: Secondary | ICD-10-CM | POA: Diagnosis not present

## 2015-08-01 DIAGNOSIS — D631 Anemia in chronic kidney disease: Secondary | ICD-10-CM | POA: Diagnosis not present

## 2015-08-01 DIAGNOSIS — N186 End stage renal disease: Secondary | ICD-10-CM | POA: Diagnosis not present

## 2015-08-01 DIAGNOSIS — Z992 Dependence on renal dialysis: Secondary | ICD-10-CM | POA: Diagnosis not present

## 2015-08-01 DIAGNOSIS — N2581 Secondary hyperparathyroidism of renal origin: Secondary | ICD-10-CM | POA: Diagnosis not present

## 2015-08-03 DIAGNOSIS — Z992 Dependence on renal dialysis: Secondary | ICD-10-CM | POA: Diagnosis not present

## 2015-08-03 DIAGNOSIS — Z7901 Long term (current) use of anticoagulants: Secondary | ICD-10-CM | POA: Diagnosis not present

## 2015-08-03 DIAGNOSIS — N2581 Secondary hyperparathyroidism of renal origin: Secondary | ICD-10-CM | POA: Diagnosis not present

## 2015-08-03 DIAGNOSIS — E1129 Type 2 diabetes mellitus with other diabetic kidney complication: Secondary | ICD-10-CM | POA: Diagnosis not present

## 2015-08-03 DIAGNOSIS — N186 End stage renal disease: Secondary | ICD-10-CM | POA: Diagnosis not present

## 2015-08-03 DIAGNOSIS — Z5181 Encounter for therapeutic drug level monitoring: Secondary | ICD-10-CM | POA: Diagnosis not present

## 2015-08-03 DIAGNOSIS — I481 Persistent atrial fibrillation: Secondary | ICD-10-CM | POA: Diagnosis not present

## 2015-08-03 DIAGNOSIS — D509 Iron deficiency anemia, unspecified: Secondary | ICD-10-CM | POA: Diagnosis not present

## 2015-08-03 DIAGNOSIS — D631 Anemia in chronic kidney disease: Secondary | ICD-10-CM | POA: Diagnosis not present

## 2015-08-05 DIAGNOSIS — N186 End stage renal disease: Secondary | ICD-10-CM | POA: Diagnosis not present

## 2015-08-05 DIAGNOSIS — Z992 Dependence on renal dialysis: Secondary | ICD-10-CM | POA: Diagnosis not present

## 2015-08-05 DIAGNOSIS — N2581 Secondary hyperparathyroidism of renal origin: Secondary | ICD-10-CM | POA: Diagnosis not present

## 2015-08-05 DIAGNOSIS — D509 Iron deficiency anemia, unspecified: Secondary | ICD-10-CM | POA: Diagnosis not present

## 2015-08-05 DIAGNOSIS — D631 Anemia in chronic kidney disease: Secondary | ICD-10-CM | POA: Diagnosis not present

## 2015-08-08 DIAGNOSIS — I120 Hypertensive chronic kidney disease with stage 5 chronic kidney disease or end stage renal disease: Secondary | ICD-10-CM | POA: Diagnosis not present

## 2015-08-08 DIAGNOSIS — N186 End stage renal disease: Secondary | ICD-10-CM | POA: Diagnosis not present

## 2015-08-08 DIAGNOSIS — D631 Anemia in chronic kidney disease: Secondary | ICD-10-CM | POA: Diagnosis not present

## 2015-08-08 DIAGNOSIS — Z992 Dependence on renal dialysis: Secondary | ICD-10-CM | POA: Diagnosis not present

## 2015-08-08 DIAGNOSIS — N2581 Secondary hyperparathyroidism of renal origin: Secondary | ICD-10-CM | POA: Diagnosis not present

## 2015-08-08 DIAGNOSIS — D509 Iron deficiency anemia, unspecified: Secondary | ICD-10-CM | POA: Diagnosis not present

## 2015-08-10 DIAGNOSIS — N186 End stage renal disease: Secondary | ICD-10-CM | POA: Diagnosis not present

## 2015-08-10 DIAGNOSIS — D509 Iron deficiency anemia, unspecified: Secondary | ICD-10-CM | POA: Diagnosis not present

## 2015-08-11 ENCOUNTER — Other Ambulatory Visit (HOSPITAL_COMMUNITY): Payer: Self-pay | Admitting: *Deleted

## 2015-08-11 ENCOUNTER — Encounter (HOSPITAL_COMMUNITY): Payer: Self-pay | Admitting: Cardiology

## 2015-08-11 DIAGNOSIS — T826XXD Infection and inflammatory reaction due to cardiac valve prosthesis, subsequent encounter: Secondary | ICD-10-CM

## 2015-08-11 NOTE — Telephone Encounter (Signed)
Patient can not schedule procedures onTues or Thurs  TEE 08/18/15 @8  am, 630 arrival  Instructions reviewed with patient and letter mailed to home address   Patient reports he sometimes has issues with transportation for morning appointments, will call back if he is unable to arrange transportation

## 2015-08-12 DIAGNOSIS — D509 Iron deficiency anemia, unspecified: Secondary | ICD-10-CM | POA: Diagnosis not present

## 2015-08-12 DIAGNOSIS — N186 End stage renal disease: Secondary | ICD-10-CM | POA: Diagnosis not present

## 2015-08-15 DIAGNOSIS — N186 End stage renal disease: Secondary | ICD-10-CM | POA: Diagnosis not present

## 2015-08-15 DIAGNOSIS — D509 Iron deficiency anemia, unspecified: Secondary | ICD-10-CM | POA: Diagnosis not present

## 2015-08-17 DIAGNOSIS — N186 End stage renal disease: Secondary | ICD-10-CM | POA: Diagnosis not present

## 2015-08-17 DIAGNOSIS — D509 Iron deficiency anemia, unspecified: Secondary | ICD-10-CM | POA: Diagnosis not present

## 2015-08-18 ENCOUNTER — Ambulatory Visit (HOSPITAL_BASED_OUTPATIENT_CLINIC_OR_DEPARTMENT_OTHER)
Admission: RE | Admit: 2015-08-18 | Discharge: 2015-08-18 | Disposition: A | Discharge status: Home / Self Care | Payer: Medicare Other | Source: Ambulatory Visit | Attending: Cardiology | Admitting: Cardiology

## 2015-08-18 ENCOUNTER — Ambulatory Visit (HOSPITAL_COMMUNITY)
Admission: RE | Admit: 2015-08-18 | Discharge: 2015-08-18 | Disposition: A | Discharge status: Home / Self Care | Payer: Medicare Other | Source: Ambulatory Visit | Attending: Cardiology | Admitting: Cardiology

## 2015-08-18 ENCOUNTER — Encounter (HOSPITAL_COMMUNITY)
Admission: RE | Disposition: A | Discharge status: Home / Self Care | Payer: Self-pay | Source: Ambulatory Visit | Attending: Cardiology

## 2015-08-18 ENCOUNTER — Encounter (HOSPITAL_COMMUNITY): Payer: Self-pay | Admitting: *Deleted

## 2015-08-18 DIAGNOSIS — I350 Nonrheumatic aortic (valve) stenosis: Secondary | ICD-10-CM

## 2015-08-18 DIAGNOSIS — Z992 Dependence on renal dialysis: Secondary | ICD-10-CM

## 2015-08-18 DIAGNOSIS — Z955 Presence of coronary angioplasty implant and graft: Secondary | ICD-10-CM | POA: Insufficient documentation

## 2015-08-18 DIAGNOSIS — T826XXS Infection and inflammatory reaction due to cardiac valve prosthesis, sequela: Secondary | ICD-10-CM

## 2015-08-18 DIAGNOSIS — N186 End stage renal disease: Secondary | ICD-10-CM | POA: Diagnosis not present

## 2015-08-18 DIAGNOSIS — I080 Rheumatic disorders of both mitral and aortic valves: Secondary | ICD-10-CM | POA: Insufficient documentation

## 2015-08-18 DIAGNOSIS — S40819A Abrasion of unspecified upper arm, initial encounter: Secondary | ICD-10-CM

## 2015-08-18 DIAGNOSIS — I5042 Chronic combined systolic (congestive) and diastolic (congestive) heart failure: Secondary | ICD-10-CM | POA: Insufficient documentation

## 2015-08-18 DIAGNOSIS — I330 Acute and subacute infective endocarditis: Secondary | ICD-10-CM

## 2015-08-18 DIAGNOSIS — L089 Local infection of the skin and subcutaneous tissue, unspecified: Secondary | ICD-10-CM

## 2015-08-18 DIAGNOSIS — I38 Endocarditis, valve unspecified: Secondary | ICD-10-CM | POA: Diagnosis not present

## 2015-08-18 DIAGNOSIS — Z952 Presence of prosthetic heart valve: Secondary | ICD-10-CM | POA: Insufficient documentation

## 2015-08-18 DIAGNOSIS — R7881 Bacteremia: Secondary | ICD-10-CM | POA: Diagnosis not present

## 2015-08-18 DIAGNOSIS — Z7982 Long term (current) use of aspirin: Secondary | ICD-10-CM | POA: Insufficient documentation

## 2015-08-18 DIAGNOSIS — I050 Rheumatic mitral stenosis: Secondary | ICD-10-CM | POA: Insufficient documentation

## 2015-08-18 DIAGNOSIS — Z8614 Personal history of Methicillin resistant Staphylococcus aureus infection: Secondary | ICD-10-CM | POA: Insufficient documentation

## 2015-08-18 DIAGNOSIS — Z86718 Personal history of other venous thrombosis and embolism: Secondary | ICD-10-CM | POA: Insufficient documentation

## 2015-08-18 DIAGNOSIS — B9689 Other specified bacterial agents as the cause of diseases classified elsewhere: Secondary | ICD-10-CM | POA: Diagnosis not present

## 2015-08-18 DIAGNOSIS — X58XXXA Exposure to other specified factors, initial encounter: Secondary | ICD-10-CM

## 2015-08-18 DIAGNOSIS — I120 Hypertensive chronic kidney disease with stage 5 chronic kidney disease or end stage renal disease: Secondary | ICD-10-CM | POA: Insufficient documentation

## 2015-08-18 DIAGNOSIS — T826XXD Infection and inflammatory reaction due to cardiac valve prosthesis, subsequent encounter: Secondary | ICD-10-CM

## 2015-08-18 HISTORY — PX: TEE WITHOUT CARDIOVERSION: SHX5443

## 2015-08-18 SURGERY — ECHOCARDIOGRAM, TRANSESOPHAGEAL
Anesthesia: Moderate Sedation

## 2015-08-18 MED ORDER — MIDAZOLAM HCL 10 MG/2ML IJ SOLN
INTRAMUSCULAR | Status: DC | PRN
  Administered 2015-08-18 (×2): 2 mg via INTRAVENOUS

## 2015-08-18 MED ORDER — FENTANYL CITRATE (PF) 100 MCG/2ML IJ SOLN
INTRAMUSCULAR | Status: DC | PRN
  Administered 2015-08-18 (×2): 25 ug via INTRAVENOUS

## 2015-08-18 MED ORDER — BUTAMBEN-TETRACAINE-BENZOCAINE 2-2-14 % EX AERO
INHALATION_SPRAY | CUTANEOUS | Status: DC | PRN
  Administered 2015-08-18: 2 via TOPICAL

## 2015-08-18 MED ORDER — FENTANYL CITRATE (PF) 100 MCG/2ML IJ SOLN
INTRAMUSCULAR | Status: AC
  Filled 2015-08-18: qty 2

## 2015-08-18 MED ORDER — MIDAZOLAM HCL 5 MG/ML IJ SOLN
INTRAMUSCULAR | Status: AC
  Filled 2015-08-18: qty 2

## 2015-08-18 MED ORDER — SODIUM CHLORIDE 0.9 % IV SOLN
INTRAVENOUS | Status: DC
  Administered 2015-08-18: 07:00:00 via INTRAVENOUS

## 2015-08-18 MED ORDER — DOXYCYCLINE HYCLATE 100 MG PO TABS: 100.0000 mg | ORAL_TABLET | Freq: Two times a day (BID) | ORAL | Status: DC

## 2015-08-18 MED ORDER — DIPHENHYDRAMINE HCL 50 MG/ML IJ SOLN
INTRAMUSCULAR | Status: AC
  Filled 2015-08-18: qty 1

## 2015-08-18 NOTE — Discharge Instructions (Signed)
Transesophageal Echocardiogram Transesophageal echocardiography (TEE) is a picture test of your heart using sound waves. The pictures taken can give very detailed pictures of your heart. This can help your doctor see if there are problems with your heart. TEE can check:  If your heart has blood clots in it.  How well your heart valves are working.  If you have an infection on the inside of your heart.  Some of the major arteries of your heart.  If your heart valve is working after a Office [employer].  Your heart before a procedure that uses a shock to your heart to get the rhythm back to normal. BEFORE THE PROCEDURE  Do not eat or drink for 6 hours before the procedure or as told by your doctor.  Make plans to have someone drive you home after the procedure. Do not drive yourself home.  An IV tube will be put in your arm. PROCEDURE  You will be given a medicine to help you relax (sedative). It will be given through the IV tube.  A numbing medicine will be sprayed or gargled in the back of your throat to help numb it.  The tip of the probe is placed into the back of your mouth. You will be asked to swallow. This helps to pass the probe into your esophagus.  Once the tip of the probe is in the right place, your doctor can take pictures of your heart.  You may feel pressure at the back of your throat. AFTER THE PROCEDURE  You will be taken to a recovery area so the sedative can wear off.  Your throat may be sore and scratchy. This will go away slowly over time.  You will go home when you are fully awake and able to swallow liquids.  You should have someone stay with you for the next 24 hours.  Do not drive or operate machinery for the next 24 hours.   This information is not intended to replace advice given to you by your health care provider. Make sure you discuss any questions you have with your health care provider.   Document Released: 04/21/2009 Document Revised: 06/29/2013  Document Reviewed: 12/24/2012 Elsevier Interactive Patient Education 2016 [employer].        Endocarditis Endocarditis is an infection of the inner layer of the heart (endocardium) or of the heart valves. Endocarditis can cause growths inside the heart or on the heart valves. These growths can destroy heart tissue and cause heart failure over time. They can also cause stroke if they break away and form a blood clot in the brain. CAUSES  Endocarditis is caused by germs that normally live in or on your body. The germs that most commonly cause endocarditis are bacteria, but fungi can also cause endocarditis. RISK FACTORS Risk factors include:  Having a heart defect.  Having artificial (prosthetic) heart valves.  Having an abnormal or damaged heart valve.  Having a history of endocarditis.  Having had a heart transplant. SIGNS AND SYMPTOMS Signs and symptoms may start suddenly, or they may start slowly and gradually get worse. Symptoms include:  Fever.  Chills.  Night sweats.  Muscle aches.  Fatigue.  Weakness.  Shortness of breath. Signs include:  An abnormal heart sound (murmur).  Retinal bleeding.  Bleeding under the nails of your fingers or toes.  Painless red spots on your palms.  Painful lumps in your fingertips or toes.  Swelling in your feet or ankles. DIAGNOSIS  To make a diagnosis,  your health care provider may:  Perform a physical exam. During the exam he or she will listen to your heart to check for a murmur. He or she may also use a scope to check for bleeding in your retinas.  Order tests. They may include:  Blood tests to look for the germs that cause endocarditis.  An echocardiogram to create an image of your heart. TREATMENT Early treatment offers the best chance for curing endocarditis and preventing complications. Treatment depends on the cause of the endocarditis. Treatment may include:  Antibiotic medicines. These may be given  through an IV tube or taken orally.  Surgery to replace your heart valve. You may need surgery if:  The endocarditis does not respond to treatment.  You develop complications.  Your heart valve is severely damaged. HOME CARE INSTRUCTIONS  Take your antibiotic as directed by your health care provider. Finish the antibiotic even if you start to feel better.  Gradually resume your usual activities.  Let your health care provider know before you have any dental or surgical procedures. You may need to take antibiotics before the procedure.  Let all your health care providers, including your dentist, know that you have had endocarditis.  Do not get tattoos or body piercings.  Do not use IV drugs unless it is part of your medical treatment.  Practice good oral hygiene. This includes:  Brushing and flossing regularly.  Scheduling routine dental appointments. SEEK MEDICAL CARE IF:  You have a fever.  Your symptoms do not improve.  Your symptoms get worse.  Your symptoms come back. SEEK IMMEDIATE MEDICAL CARE IF:  You have trouble breathing.  You have chest pain.  You have symptoms of stroke. These include:  Sudden weakness.  Numbness.  Confusion.  Trouble talking.  A severe headache.   This information is not intended to replace advice given to you by your health care provider. Make sure you discuss any questions you have with your health care provider.   Document Released: 06/24/2005 Document Revised: 07/15/2014 Document Reviewed: 02/08/2014 Elsevier Interactive Patient Education [employer].

## 2015-08-18 NOTE — CV Procedure (Signed)
Procedure: TEE  Indication: Endocarditis  Sedation: Versed 4 mg IV, Fentanyl 50 mcg IV  Findings: Please see echo section for full report.  Normal LV size with mild LV hypertrophy.  EF 55-60%.  Normal RV size and systolic function.  No significant TR.  Trivial PI.  Trileaflet aortic valve with relatively fixed right coronary cusp, mild aortic stenosis.  No aortic insufficiency, no vegetation.  The mitral valve is status post repair.  There is a 7 mm x 2 mm mobile vegetation attached to the anterior sewing ring.  There is trivial MR and probably no significant mitral stenosis.  Mild left atrial enlargement.  No LAA thrombus.  Normal right atrium.  Normal caliber aorta with grade III-IV plaque in descending thoracic aorta.    Impression: Persistent mitral valve vegetation (small) attached to the anterior sewing ring.  Will send blood cultures and consult ID.   Timothy Palmer 08/18/2015 8:55 AM

## 2015-08-18 NOTE — H&P (View-Only) (Signed)
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 Prior to his mitral valve repair he underwent left above-knee to below-knee popliteal bypass using reversed greater saphenous vein graft by Dr. Donnetta Hutching for left lower extremity ischemia secondary to septic embolization to the popliteal artery.  He did remarkably well until 03/19/2015 when he was hospitalized with numerous medical problems including septic emboli to the lung. Transesophageal echocardiogram revealed a mobile vegetation adherent to the atrial surface of the anterior leaflet of the patient's repaired mitral valve, consistent with actual endocarditis. Cultures obtained during that hospitalization were all negative.  He was treated with a 6 week course of intravenous vancomycin and Rocephin. He was last seen in follow-up in the infectious disease clinic by Dr. Graylon Good on 05/16/2015 at which time he was still on antibiotics. Plans were made for the patient undergo follow-up transesophageal echocardiogram prior to stopping antibiotics. This did not occur for unclear reasons, although the patient admits that he may have missed a few doctors appointments. He returns for office today for follow-up. He denies any fevers chills arthralgias or night sweats.  He denies any symptoms of exertional shortness of breath. He complains that he gets severe burning pain in the calf muscles of both lower legs  with ambulation. This is considerably worse on the right side than the left. He states that when the pain comes on he has to stop for at least 10 minutes to allow the pain to subside. Pain does not occur at rest.  He has not had headaches or transient visual disturbances. He has not had problems with routine dialysis treatments. He complains that he has lost a lot of weight and strength. Appetite is okay.    Current Outpatient Prescriptions  Medication Sig Dispense Refill  . aspirin 81 MG tablet Take 81 mg by mouth daily.    . cinacalcet (SENSIPAR) 90 MG tablet Take 1 tablet (90 mg total) by mouth 2 (two) times daily.    Marland Kitchen lisinopril (PRINIVIL,ZESTRIL) 20 MG tablet Take 20 mg by mouth daily.    . multivitamin (RENA-VIT) TABS tablet Take 1 tablet by mouth at bedtime. 30 tablet 0  . Nutritional Supplements (FEEDING SUPPLEMENT, NEPRO CARB STEADY,) LIQD Take 237 mLs by mouth 2 (two) times daily between meals.     No current facility-administered medications for this visit.      Physical Exam:   BP 114/77 mmHg  Pulse 96  Resp 20  Ht 5\' 11"  (1.803 m)  Wt 225 lb (102.059 kg)  BMI 31.39 kg/m2  SpO2 95%  General:  Well-appearing  Chest:   Clear to auscultation  CV:   Regular rate and rhythm without murmur  Incisions:  n/a  Abdomen:  Soft and nontender  Extremities:  Warm and well perfused. Distal pulses are not palpable in either lower leg at the ankle  Diagnostic Tests:  n/a   Impression:  Patient appears clinically stable and without any definite  signs or symptoms to suggest persistent prosthetic valve endocarditis. He has not undergone follow-up transesophageal echocardiogram as previously planned and he has now been off of antibiotics for several weeks. He does complain of bilateral calf claudication, right greater than left.    Plan:  We will obtain follow-up blood cultures today and attempt to make arrangements for the patient undergo follow-up transesophageal echocardiogram  as soon as practical. The patient will need to be seen in follow-up in the infectious disease clinic. We will also refer the patient to Dr[facility] for [facility] extremity duplex examination with ABIs.  We will defer to Dr. Donnetta Hutching discretion whether not the patient needs to undergo CT angiography or arteriogram with runoff.   I spent in excess of 15 minutes during the conduct of this office consultation and >50% of this time involved direct face-to-face encounter with the patient for counseling and/or coordination of their care.    Valentina Gu. Roxy Manns, MD 07/24/2015 2:28 PM

## 2015-08-18 NOTE — Interval H&P Note (Signed)
History and Physical Interval Note:  08/18/2015 8:21 AM  Timothy Palmer  has presented today for surgery, with the diagnosis of CHS   The various methods of treatment have been discussed with the patient and family. After consideration of risks, benefits and other options for treatment, the patient has consented to  Procedure(s): TRANSESOPHAGEAL ECHOCARDIOGRAM (TEE) (N/A) as a surgical intervention .  The patient's history has been reviewed, patient examined, no change in status, stable for surgery.  I have reviewed the patient's chart and labs.  Questions were answered to the patient's satisfaction.     Shawntae Lowy [employer]

## 2015-08-18 NOTE — Progress Notes (Signed)
[facility] for Infectious Disease         Reason for Consult: vegetation on TEE  Referring Physician: Aundra Dubin  Active Problems:   * No active hospital problems. *    HPI: Timothy Palmer is a 50 y.o. male with ESRD, hx of mssa endocarditis  S/p mitral valve repair ( 28 mm Sorin Memo 3D ring annuloplasty via right mini thoracotomy approach) in Fall 2015, who recently was treated for prosthetic MV endocarditis, finished 6 wk course of treatment in mid-late November who was admitted for repeat, end of treatment TEE today by Dr. Aundra Dubin. The TEE revealed still evidence of residual vegetation.  Normal LV size with mild LV hypertrophy. EF 55-60%. Normal RV size and systolic function. No significant TR. Trivial PI. Trileaflet aortic valve with relatively fixed right coronary cusp, mild aortic stenosis. No aortic insufficiency, no vegetation. The mitral valve is status post repair. There is a 7 mm x 2 mm mobile vegetation attached to the anterior sewing ring. There is trivial MR and probably no significant mitral stenosis. Mild left atrial enlargement. No LAA thrombus. Normal right atrium. Normal caliber aorta with grade III-IV plaque in descending thoracic aorta.   Patient reports being in good state of health, denies fever, chills, nightsweats,. He does report that he has skin tear from paper tape on his entry points from HD. He reinforces it but then sustains skin tears when he pulls off the tape. Past Medical History  Diagnosis Date  . Hypertension   . Peripheral vascular disease ([place])   . Pneumonia 2014  . ESRD (end stage renal disease) on dialysis ([place])     [place],Dialysis- T,Th,S  . Severe mitral regurgitation 03/23/2014    by TEE  . DVT of upper extremity (deep vein thrombosis) ([place]) 03/04/2014    Right arm  . Acute combined systolic and diastolic congestive heart failure ([place]) 03/21/2014  . Chronic diastolic congestive heart failure ([place])   . Bacterial endocarditis -  MSSA positive blood cultures with mitral valve vegetation, severe mitral regurgitation, and septic embolization 03/21/2014  . Septic embolism to left lower extremity 03/25/2014  . 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 The aneurysm measures 1.4 x 1.1 cm which is  significantly larger than the 4.5 mm parent vessel.   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 Prosthetic valve endocarditis ([place]) 04/12/2015    Vegetation on atrial surface of repaired native mitral valve seen on TEE    Allergies: No Known Allergies   MEDICATIONS: reviewed home meds    Social History  Substance Use Topics  . Smoking status: Never Smoker   . Smokeless tobacco: Never Used  . Alcohol Use: No    Family History  Problem Relation Age of Onset  . Diabetes Mother   . Hypertension Mother   . Hypertension Father   . Hypertension Sister   . Hypertension Brother     Review of Systems  Constitutional: Negative for fever, chills, diaphoresis, activity change, appetite change, fatigue and unexpected weight change.  HENT: Negative for congestion, sore throat, rhinorrhea, sneezing, trouble swallowing and sinus pressure.  Eyes: Negative for  photophobia and visual disturbance.  Respiratory: Negative for cough, chest tightness, shortness of breath, wheezing and stridor.  Cardiovascular: Negative for chest pain, palpitations and leg swelling.  Gastrointestinal: Negative for nausea, vomiting, abdominal pain, diarrhea, constipation, blood in stool, abdominal distention and anal bleeding.  Genitourinary: Negative for dysuria, hematuria, flank  pain and difficulty urinating.  Musculoskeletal: Negative for myalgias, back pain, joint swelling, arthralgias and gait problem.  Skin: Negative for color change, pallor, rash and wound.  Neurological: Negative for dizziness, tremors, weakness and light-headedness.  Hematological: Negative for adenopathy. Does not bruise/bleed easily.  Psychiatric/Behavioral: Negative for behavioral problems, confusion, sleep disturbance, dysphoric mood, decreased concentration and agitation.     OBJECTIVE: Temp:  [97.8 F (36.6 C)] 97.8 F (36.6 C) (02/10 0706) Pulse Rate:  [68-91] 68 (02/10 0930) Resp:  [10-22] 18 (02/10 0930) BP: (125-174)/(89-124) 147/96 mmHg (02/10 0930) SpO2:  [95 %-100 %] 95 % (02/10 0930) Weight:  [225 lb (102.059 kg)] 225 lb (102.059 kg) (02/10 0706) Physical Exam  Constitutional: He is oriented to person, place, and time. He appears well-developed and well-nourished. No distress.  HENT:  Mouth/Throat: Oropharynx is clear and moist. No oropharyngeal exudate.  Cardiovascular: Normal rate, regular rhythm and normal heart sounds. Exam reveals no gallop and no friction rub.  No murmur heard.  Pulmonary/Chest: Effort normal and breath sounds normal. No respiratory distress. He has no wheezes.  Abdominal: Soft. Bowel sounds are normal. He exhibits no distension. There is no tenderness.  Lymphadenopathy:  He has no cervical adenopathy.  Neurological: He is alert and oriented to person, place, and time.  Skin: Skin is warm and dry. No rash noted. No erythema.  Psychiatric: He has a normal mood and affect. His behavior is normal.     LABS: none MICRO: none  Assessment/Plan:  50yo M who was recently treated for prosthetic mitral valve endocarditis through mid November, now has had repeat TEE showing residual vegetation, smaller than prior study, patient overall asymptomatic.  - vegetations can take up to 6 months to dissolve. Given no systemic symptoms, at this point. I don't  feel he needs further IV abtx but would recommend surveillance blood cultures - superficial abrasian on HD arm, has some slight erythema, will treat with a 7 day course of doxycycline - will have him come back in 4-6 wk to [facility] for follow up - defer to Dr. Aundra Dubin for when it is optimal to repeat TEE, if needed - i have given precautions to patient to look for signs of stroke, and risk of embolization of vegetation. - recommend him to use different type of tape for post dialysis, perhaps the type of gauze bandaged used for blood donation would cause less skin abrasions  i have spent 45 min with patient in reviewing his case, coordination of care and counseling on endocarditis  Una [facility] [phone]

## 2015-08-19 DIAGNOSIS — D509 Iron deficiency anemia, unspecified: Secondary | ICD-10-CM | POA: Diagnosis not present

## 2015-08-19 DIAGNOSIS — N186 End stage renal disease: Secondary | ICD-10-CM | POA: Diagnosis not present

## 2015-08-21 ENCOUNTER — Encounter (HOSPITAL_COMMUNITY): Payer: Self-pay | Admitting: Cardiology

## 2015-08-22 ENCOUNTER — Encounter: Payer: Self-pay | Admitting: Family

## 2015-08-22 DIAGNOSIS — N186 End stage renal disease: Secondary | ICD-10-CM | POA: Diagnosis not present

## 2015-08-22 DIAGNOSIS — D509 Iron deficiency anemia, unspecified: Secondary | ICD-10-CM | POA: Diagnosis not present

## 2015-08-23 LAB — CULTURE, BLOOD (ROUTINE X 2)
CULTURE: NO GROWTH
CULTURE: NO GROWTH

## 2015-08-24 DIAGNOSIS — D509 Iron deficiency anemia, unspecified: Secondary | ICD-10-CM | POA: Diagnosis not present

## 2015-08-24 DIAGNOSIS — N186 End stage renal disease: Secondary | ICD-10-CM | POA: Diagnosis not present

## 2015-08-26 DIAGNOSIS — D509 Iron deficiency anemia, unspecified: Secondary | ICD-10-CM | POA: Diagnosis not present

## 2015-08-26 DIAGNOSIS — N186 End stage renal disease: Secondary | ICD-10-CM | POA: Diagnosis not present

## 2015-08-29 ENCOUNTER — Other Ambulatory Visit: Payer: Self-pay

## 2015-08-29 ENCOUNTER — Ambulatory Visit (INDEPENDENT_AMBULATORY_CARE_PROVIDER_SITE_OTHER)
Admission: RE | Admit: 2015-08-29 | Discharge: 2015-08-29 | Disposition: A | Discharge status: Home / Self Care | Payer: Medicare Other | Source: Ambulatory Visit | Attending: Family | Admitting: Family

## 2015-08-29 ENCOUNTER — Ambulatory Visit (INDEPENDENT_AMBULATORY_CARE_PROVIDER_SITE_OTHER): Payer: Medicare Other | Admitting: Family

## 2015-08-29 ENCOUNTER — Ambulatory Visit (HOSPITAL_COMMUNITY)
Admission: RE | Admit: 2015-08-29 | Discharge: 2015-08-29 | Disposition: A | Discharge status: Home / Self Care | Payer: Medicare Other | Source: Ambulatory Visit | Attending: Family | Admitting: Family

## 2015-08-29 ENCOUNTER — Encounter: Payer: Self-pay | Admitting: Family

## 2015-08-29 VITALS — BP 91/67 | HR 101 | Temp 97.4°F | Resp 18 | Ht 71.0 in | Wt 224.0 lb

## 2015-08-29 DIAGNOSIS — Z48812 Encounter for surgical aftercare following surgery on the circulatory system: Secondary | ICD-10-CM | POA: Insufficient documentation

## 2015-08-29 DIAGNOSIS — I5032 Chronic diastolic (congestive) heart failure: Secondary | ICD-10-CM | POA: Insufficient documentation

## 2015-08-29 DIAGNOSIS — Z95828 Presence of other vascular implants and grafts: Secondary | ICD-10-CM

## 2015-08-29 DIAGNOSIS — Z992 Dependence on renal dialysis: Secondary | ICD-10-CM | POA: Diagnosis not present

## 2015-08-29 DIAGNOSIS — R0989 Other specified symptoms and signs involving the circulatory and respiratory systems: Secondary | ICD-10-CM | POA: Diagnosis present

## 2015-08-29 DIAGNOSIS — N186 End stage renal disease: Secondary | ICD-10-CM

## 2015-08-29 DIAGNOSIS — I739 Peripheral vascular disease, unspecified: Secondary | ICD-10-CM | POA: Diagnosis not present

## 2015-08-29 DIAGNOSIS — D509 Iron deficiency anemia, unspecified: Secondary | ICD-10-CM | POA: Diagnosis not present

## 2015-08-29 DIAGNOSIS — I132 Hypertensive heart and chronic kidney disease with heart failure and with stage 5 chronic kidney disease, or end stage renal disease: Secondary | ICD-10-CM | POA: Diagnosis not present

## 2015-08-29 DIAGNOSIS — Z87891 Personal history of nicotine dependence: Secondary | ICD-10-CM

## 2015-08-29 NOTE — Patient Instructions (Signed)
Peripheral Vascular Disease Peripheral vascular disease (PVD) is a disease of the blood vessels that are not part of your heart and brain. A simple term for PVD is poor circulation. In most cases, PVD narrows the blood vessels that carry blood from your heart to the rest of your body. This can result in a decreased supply of blood to your arms, legs, and internal organs, like your stomach or kidneys. However, it most often affects a person's lower legs and feet. There are two types of PVD.  Organic PVD. This is the more common type. It is caused by damage to the structure of blood vessels.  Functional PVD. This is caused by conditions that make blood vessels contract and tighten (spasm). Without treatment, PVD tends to get worse over time. PVD can also lead to acute ischemic limb. This is when an arm or limb suddenly has trouble getting enough blood. This is a medical emergency. CAUSES Each type of PVD has many different causes. The most common cause of PVD is buildup of a fatty material (plaque) inside of your arteries (atherosclerosis). Small amounts of plaque can break off from the walls of the blood vessels and become lodged in a smaller artery. This blocks blood flow and can cause acute ischemic limb. Other common causes of PVD include:  Blood clots that form inside of blood vessels.  Injuries to blood vessels.  Diseases that cause inflammation of blood vessels or cause blood vessel spasms.  Health behaviors and health history that increase your risk of developing PVD. RISK FACTORS  You may have a greater risk of PVD if you:  Have a family history of PVD.  Have certain medical conditions, including:  High cholesterol.  Diabetes.  High blood pressure (hypertension).  Coronary heart disease.  Past problems with blood clots.  Past injury, such as burns or a broken bone. These may have damaged blood vessels in your limbs.  Buerger disease. This is caused by inflamed blood  vessels in your hands and feet.  Some forms of arthritis.  Rare birth defects that affect the arteries in your legs.  Use tobacco.  Do not get enough exercise.  Are obese.  Are age 50 or older. SIGNS AND SYMPTOMS  PVD may cause many different symptoms. Your symptoms depend on what part of your body is not getting enough blood. Some common signs and symptoms include:  Cramps in your lower legs. This may be a symptom of poor leg circulation (claudication).  Pain and weakness in your legs while you are physically active that goes away when you rest (intermittent claudication).  Leg pain when at rest.  Leg numbness, tingling, or weakness.  Coldness in a leg or foot, especially when compared with the other leg.  Skin or hair changes. These can include:  Hair loss.  Shiny skin.  Pale or bluish skin.  Thick toenails.  Inability to get or maintain an erection (erectile dysfunction). People with PVD are more prone to developing ulcers and sores on their toes, feet, or legs. These may take longer than normal to heal. DIAGNOSIS Your health care provider may diagnose PVD from your signs and symptoms. The health care provider will also do a physical exam. You may have tests to find out what is causing your PVD and determine its severity. Tests may include:  Blood pressure recordings from your arms and legs and measurements of the strength of your pulses (pulse volume recordings).  Imaging studies using sound waves to take pictures of   the blood flow through your blood vessels (Doppler ultrasound).  Injecting a dye into your blood vessels before having imaging studies using:  X-rays (angiogram or arteriogram).  Computer-generated X-rays (CT angiogram).  A powerful electromagnetic field and a computer (magnetic resonance angiogram or MRA). TREATMENT Treatment for PVD depends on the cause of your condition and the severity of your symptoms. It also depends on your age. Underlying  causes need to be treated and controlled. These include long-lasting (chronic) conditions, such as diabetes, high cholesterol, and high blood pressure. You may need to first try making lifestyle changes and taking medicines. Surgery may be needed if these do not work. Lifestyle changes may include:  Quitting smoking.  Exercising regularly.  Following a low-fat, low-cholesterol diet. Medicines may include:  Blood thinners to prevent blood clots.  Medicines to improve blood flow.  Medicines to improve your blood cholesterol levels. Surgical procedures may include:  A procedure that uses an inflated balloon to open a blocked artery and improve blood flow (angioplasty).  A procedure to put in a tube (stent) to keep a blocked artery open (stent implant).  Surgery to reroute blood flow around a blocked artery (peripheral bypass surgery).  Surgery to remove dead tissue from an infected wound on the affected limb.  Amputation. This is surgical removal of the affected limb. This may be necessary in cases of acute ischemic limb that are not improved through medical or surgical treatments. HOME CARE INSTRUCTIONS  Take medicines only as directed by your health care provider.  Do not use any tobacco products, including cigarettes, chewing tobacco, or electronic cigarettes. If you need help quitting, ask your health care provider.  Lose weight if you are overweight, and maintain a healthy weight as directed by your health care provider.  Eat a diet that is low in fat and cholesterol. If you need help, ask your health care provider.  Exercise regularly. Ask your health care provider to suggest some good activities for you.  Use compression stockings or other mechanical devices as directed by your health care provider.  Take good care of your feet.  Wear comfortable shoes that fit well.  Check your feet often for any cuts or sores. SEEK MEDICAL CARE IF:  You have cramps in your legs  while walking.  You have leg pain when you are at rest.  You have coldness in a leg or foot.  Your skin changes.  You have erectile dysfunction.  You have cuts or sores on your feet that are not healing. SEEK IMMEDIATE MEDICAL CARE IF:  Your arm or leg turns cold and blue.  Your arms or legs become red, warm, swollen, painful, or numb.  You have chest pain or trouble breathing.  You suddenly have weakness in your face, arm, or leg.  You become very confused or lose the ability to speak.  You suddenly have a very bad headache or lose your vision.   This information is not intended to replace advice given to you by your health care provider. Make sure you discuss any questions you have with your health care provider.   Document Released: 08/01/2004 Document Revised: 07/15/2014 Document Reviewed: 12/02/2013 Elsevier Interactive Patient Education 2016 [employer] Inc[employer]  

## 2015-08-29 NOTE — Progress Notes (Signed)
[facility] [place] HISTORY AND PHYSICAL -PAD  History of Present Illness Timothy Palmer is a 50 y.o. male patient of Dr. Donnetta Hutching who returns today for followup of his left above-knee to below-knee popliteal bypass on 03/25/14. He had probable mycotic embolus from heart valve. He recovered from the surgery and underwent an open repair. He has a long history of greater than 12 years of hemodialysis for renal failure, seems to be using his original HD access in his left arm. He dialyzes T-TH-SAT.  Both calves burn/throb after an hour of walking in [place], takes 45 minutes for his calves to recover He is walking 3 blocks daily since his calves have been hurting, was walking 1-2 miles daily. He denies non healing wounds, denies rest pain.   He had a ballooning of a stenosis in his left forearm AVF on October 07 2014, possibly by Dr. Augustin Coupe; pt states AVF is working well; dialyzes T-TH-SAT. He has left fingertips numbness all of the time.  He denies any history of stroke or TIA.  Pt Diabetic: No Pt smoker: former smoker, quit in 2001  Pt meds include: Statin :No ASA: Yes Other anticoagulants/antiplatelets: coumadin was stopped a year after his heart valve surgery   Past Medical History  Diagnosis Date  . Hypertension   . Peripheral vascular disease ([place])   . Pneumonia 2014  . ESRD (end stage renal disease) on dialysis ([place])     [place],Dialysis- T,Th,S  . Severe mitral regurgitation 03/23/2014    by TEE  . DVT of upper extremity (deep vein thrombosis) ([place]) 03/04/2014    Right arm  . Acute combined systolic and diastolic congestive heart failure ([place]) 03/21/2014  . Chronic diastolic congestive heart failure ([place])   . 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 Septic embolism to left lower extremity 03/25/2014  . 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 The aneurysm measures 1.4 x 1.1 cm which is  significantly larger than the 4.5 mm parent vessel.   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 Smoking status: Never Smoker   . Smokeless tobacco: Never Used  . Alcohol Use: No    Family History Family History  Problem Relation Age of Onset  . Diabetes Mother   . Hypertension Mother   . Hypertension Father   . Hypertension Sister   . Hypertension Brother     Past Surgical History  Procedure Laterality Date  . Av fistula placement Left ?2005    forearm   . Shuntogram Left November 04, 2011  . Patch angioplasty Left 07/23/2013    Procedure: PATCH ANGIOPLASTY- LEFT RADIOCEPHALIC ARTERIOVENOUS FISTULA;  Surgeon: Angelia Mould, MD;  Location: [facility];  Service: Vascular;  Laterality: Left;  . Tee without cardioversion N/A 03/24/2014    Procedure: TRANSESOPHAGEAL ECHOCARDIOGRAM (TEE);  Surgeon: Larey Dresser, MD;  Location: [facility]  ENDOSCOPY;  Service: Cardiovascular;  Laterality: N/A;  . Embolectomy Left 03/25/2014    Procedure: EMBOLECTOMY left popliteal;  Surgeon: Rosetta Posner, MD;  Location: [place];  Service: Vascular;  Laterality: Left;  . Femoral-popliteal bypass graft Left 03/25/2014    Procedure: Left Femoral- Below Knee Popliteal Bypass Graft;  Surgeon: Rosetta Posner, MD;  Location: [place];  Service: Vascular;  Laterality: Left;  . Mitral valve repair Right 04/05/2014    Procedure: MINIMALLY  INVASIVE MITRAL VALVE REPAIR (MVR);  Surgeon: Rexene Alberts, MD;  Location: [place];  Service: Open Heart Surgery;  Laterality: Right;  . Intraoperative transesophageal echocardiogram N/A 04/05/2014    Procedure: INTRAOPERATIVE TRANSESOPHAGEAL ECHOCARDIOGRAM;  Surgeon: Rexene Alberts, MD;  Location: [place];  Service: Open Heart Surgery;  Laterality: N/A;  . Mitral valve replacement Right 04/05/2014    Procedure: Bring back MINIMALLY INVASIVE MITRAL VALVE (MV) REPLACEMENT - Reexploration for bleeding;  Surgeon: Rexene Alberts, MD;  Location: [place];  Service: Open Heart Surgery;  Laterality: Right;  . Left and right heart catheterization with coronary angiogram N/A 03/28/2014    Procedure: LEFT AND RIGHT HEART CATHETERIZATION WITH CORONARY ANGIOGRAM;  Surgeon: Larey Dresser, MD;  Location: [facility] CATH LAB;  Service: Cardiovascular;  Laterality: N/A;  . Tee without cardioversion N/A 04/12/2015    Procedure: TRANSESOPHAGEAL ECHOCARDIOGRAM (TEE);  Surgeon: Larey Dresser, MD;  Location: [place];  Service: Cardiovascular;  Laterality: N/A;  . Tee without cardioversion N/A 08/18/2015    Procedure: TRANSESOPHAGEAL ECHOCARDIOGRAM (TEE);  Surgeon: Larey Dresser, MD;  Location: [place];  Service: Cardiovascular;  Laterality: N/A;    No Known Allergies  Current Outpatient Prescriptions  Medication Sig Dispense Refill  . aspirin 81 MG tablet Take 81 mg by mouth daily.    . cinacalcet (SENSIPAR) 90 MG tablet Take 1 tablet (90 mg total) by mouth 2 (two) times daily.    Marland Kitchen doxycycline (VIBRA-TABS) 100 MG tablet Take 1 tablet (100 mg total) by mouth 2 (two) times daily. On a full stomach 14 tablet 0  . lisinopril (PRINIVIL,ZESTRIL) 20 MG tablet Take 20 mg by mouth daily.    . multivitamin (RENA-VIT) TABS tablet Take 1 tablet by mouth at bedtime. (Patient not taking: Reported on 08/15/2015) 30 tablet 0  . Nutritional Supplements (FEEDING SUPPLEMENT, NEPRO CARB STEADY,) LIQD Take 237 mLs by mouth 2 (two)  times daily between meals. (Patient taking differently: Take 237 mLs by mouth daily. )    . sevelamer carbonate (RENVELA) 800 MG tablet Take 1,600-2,400 mg by mouth 3 (three) times daily with meals.     No current facility-administered medications for this visit.    ROS: See HPI for pertinent positives and negatives.   Physical Examination  Filed Vitals:   08/29/15 1251 08/29/15 1258  BP: 99/74 91/67  Pulse: 101 101  Temp: 97.4 F (36.3 C)   TempSrc: Oral   Resp: 18   Height: 5\' 11"  (1.803 m)   Weight: 224 lb (101.606 kg)   SpO2: 99%    Body mass index is 31.26 kg/(m^2).    General: A&O x 3, WDWN, obese male. Gait: normal Eyes: PERRLA.  Neck: mild thyromegaly, right lobe larger than left  Pulmonary: CTAB, without wheezes , rales or rhonchi. Cardiac: regular rythm , no detected murmur.     Carotid Bruits Right Left   Negative Negative   Aorta is not palpable. Radial pulses: faintly palpable, both brachial pulses are 1+ palpable. Left forearm AVF does  not have a palpable thrill, bruit is audible   VASCULAR EXAM: Extremities without ischemic changes, without Gangrene; without open wounds.     LE Pulses Right Left   FEMORAL 2+ palpable 2+ palpable    POPLITEAL not palpable  not palpable   POSTERIOR TIBIAL 1+ palpable  1+ palpable    DORSALIS PEDIS  ANTERIOR TIBIAL 1+ palpable  2+ palpable    Abdomen: soft, NT, no palpable masses. Skin: no rashes, no ulcers, onchyomycosis most toenails. Musculoskeletal: no muscle wasting or atrophy. Neurologic: A&O X 3; Appropriate Affect,; MOTOR FUNCTION: moving all extremities equally, motor strength 5/5 throughout. Speech is fluent/normal. CN 2-12 intact.                 Non-Invasive Vascular Imaging: DATE: 08/29/2015 LOWER EXTREMITY ARTERIAL DUPLEX EVALUATION    INDICATION: Evaluate bypass graft     PREVIOUS INTERVENTION(S): Embolic bacterial endocarditis with failed left popliteal embolectomy and placement of above-knee to below-knee popliteal bypass graft 03/25/2014. Left arteriovenous dialysis fistula.    DUPLEX EXAM:     RIGHT  LEFT   Peak Systolic Velocity (cm/s) Ratio (if abnormal) Waveform  Peak Systolic Velocity (cm/s) Ratio (if abnormal) Waveform     Inflow Artery 59  T      Proximal Anastomosis 67  T     Proximal Graft 75  T     Mid Graft 52  T      Distal Graft 54  T     Distal Anastomosis 39  T     Outflow Artery 81  T  1.49 Today's ABI / TBI 1.56  1.29 Previous ABI / TBI (02/16/2015  ) 1.5    Waveform:    M - Monophasic       B - Biphasic       T - Triphasic  If Ankle Brachial Index (ABI) or Toe Brachial Index (TBI) performed, please see complete report     ADDITIONAL FINDINGS: Ankle-brachial indices unreliable due to the presence of calcified vessels.    IMPRESSION: Patent left lower extremity bypass graft with no evidence of restenosis. Mild hyperplasia noted in the native outflow PT trunk without stenosis. Stable ectasia of the distal anastomosis measuring 1.18cm in diameter.     Compared to the previous exam:  No significant change in comparison to the last exam on 02/16/2015.       ASSESSMENT: Timothy Palmer is a 50 y.o. male who is s/p left above-knee to below-knee popliteal bypass on 03/25/14. He had probable mycotic embolus from heart valve.  He has a long history of greater than 11 years of hemodialysis for renal failure, seems to be using his original HD access. He was walking 1-2 miles daily with no claudication symptoms, recently he has been walking 3 blocks daily due to burning/throbbing in both calves that occurs after an hour of walking, resolves after about 45 minutes of rest. He has no signs of ischemia in  his feet/legs.   Today's left LE arterial Duplex suggests a patent left lower extremity bypass graft with no evidence of restenosis. Mild hyperplasia noted in the native outflow PT trunk without stenosis. No significant change in comparison to the last exam on 02/16/2015.   I discussed with Dr. Donnetta Hutching: pt's burning/throbbing in both calves after walking an hour which does not correspond to triphasic waveforms in both legs ABI's, and normal toe brachial indices in both great toes; this symptom etiology lies elsewhere.   He has mild thyromegaly, right lobe  larger than left. Consider checking thyroid function with serum TSH and free T4, defer to pt's PCP.   PLAN:  Based on the patient's vascular studies and examination, pt will return to clinic in 6 months with ABI's and left LE arterial duplex. I advised pt to notify us immediately if he develops concerns re the circulation in his legs.  I discussed in depth with the patient the nature of atherosclerosis, and emphasized the importance of maximal medical management including strict control of blood pressure, blood glucose, and lipid levels, obtaining regular exercise, and continued cessation of smoking.  The patient is aware that without maximal medical management the underlying atherosclerotic disease process will progress, limiting the benefit of any interventions.  The patient was given information about PAD including signs, symptoms, treatment, what symptoms should prompt the patient to seek immediate medical care, and risk reduction measures to take.  Clemon Chambers, RN, MSN, FNP-C [facility] Phone: [phone]  Clinic MD: Early  08/29/2015 12:53 PM

## 2015-08-29 NOTE — Progress Notes (Signed)
Filed Vitals:   08/29/15 1251 08/29/15 1258  BP: 99/74 91/67  Pulse: 101 101  Temp: 97.4 F (36.3 C)   TempSrc: Oral   Resp: 18   Height: 5\' 11"  (1.803 m)   Weight: 224 lb (101.606 kg)   SpO2: 99%

## 2015-08-31 DIAGNOSIS — Z7901 Long term (current) use of anticoagulants: Secondary | ICD-10-CM | POA: Diagnosis not present

## 2015-08-31 DIAGNOSIS — Z5181 Encounter for therapeutic drug level monitoring: Secondary | ICD-10-CM | POA: Diagnosis not present

## 2015-08-31 DIAGNOSIS — D509 Iron deficiency anemia, unspecified: Secondary | ICD-10-CM | POA: Diagnosis not present

## 2015-08-31 DIAGNOSIS — N186 End stage renal disease: Secondary | ICD-10-CM | POA: Diagnosis not present

## 2015-08-31 DIAGNOSIS — I481 Persistent atrial fibrillation: Secondary | ICD-10-CM | POA: Diagnosis not present

## 2015-09-02 DIAGNOSIS — N186 End stage renal disease: Secondary | ICD-10-CM | POA: Diagnosis not present

## 2015-09-02 DIAGNOSIS — D509 Iron deficiency anemia, unspecified: Secondary | ICD-10-CM | POA: Diagnosis not present

## 2015-09-05 DIAGNOSIS — I120 Hypertensive chronic kidney disease with stage 5 chronic kidney disease or end stage renal disease: Secondary | ICD-10-CM | POA: Diagnosis not present

## 2015-09-05 DIAGNOSIS — N186 End stage renal disease: Secondary | ICD-10-CM | POA: Diagnosis not present

## 2015-09-05 DIAGNOSIS — D509 Iron deficiency anemia, unspecified: Secondary | ICD-10-CM | POA: Diagnosis not present

## 2015-09-05 DIAGNOSIS — Z992 Dependence on renal dialysis: Secondary | ICD-10-CM | POA: Diagnosis not present

## 2015-09-07 DIAGNOSIS — D631 Anemia in chronic kidney disease: Secondary | ICD-10-CM | POA: Diagnosis not present

## 2015-09-07 DIAGNOSIS — N186 End stage renal disease: Secondary | ICD-10-CM | POA: Diagnosis not present

## 2015-09-07 DIAGNOSIS — D509 Iron deficiency anemia, unspecified: Secondary | ICD-10-CM | POA: Diagnosis not present

## 2015-09-09 DIAGNOSIS — N186 End stage renal disease: Secondary | ICD-10-CM | POA: Diagnosis not present

## 2015-09-09 DIAGNOSIS — D509 Iron deficiency anemia, unspecified: Secondary | ICD-10-CM | POA: Diagnosis not present

## 2015-09-09 DIAGNOSIS — D631 Anemia in chronic kidney disease: Secondary | ICD-10-CM | POA: Diagnosis not present

## 2015-09-12 DIAGNOSIS — N186 End stage renal disease: Secondary | ICD-10-CM | POA: Diagnosis not present

## 2015-09-12 DIAGNOSIS — D631 Anemia in chronic kidney disease: Secondary | ICD-10-CM | POA: Diagnosis not present

## 2015-09-12 DIAGNOSIS — D509 Iron deficiency anemia, unspecified: Secondary | ICD-10-CM | POA: Diagnosis not present

## 2015-09-14 DIAGNOSIS — D631 Anemia in chronic kidney disease: Secondary | ICD-10-CM | POA: Diagnosis not present

## 2015-09-14 DIAGNOSIS — D509 Iron deficiency anemia, unspecified: Secondary | ICD-10-CM | POA: Diagnosis not present

## 2015-09-14 DIAGNOSIS — N186 End stage renal disease: Secondary | ICD-10-CM | POA: Diagnosis not present

## 2015-09-16 DIAGNOSIS — D509 Iron deficiency anemia, unspecified: Secondary | ICD-10-CM | POA: Diagnosis not present

## 2015-09-16 DIAGNOSIS — D631 Anemia in chronic kidney disease: Secondary | ICD-10-CM | POA: Diagnosis not present

## 2015-09-16 DIAGNOSIS — N186 End stage renal disease: Secondary | ICD-10-CM | POA: Diagnosis not present

## 2015-09-19 DIAGNOSIS — N186 End stage renal disease: Secondary | ICD-10-CM | POA: Diagnosis not present

## 2015-09-19 DIAGNOSIS — D509 Iron deficiency anemia, unspecified: Secondary | ICD-10-CM | POA: Diagnosis not present

## 2015-09-19 DIAGNOSIS — D631 Anemia in chronic kidney disease: Secondary | ICD-10-CM | POA: Diagnosis not present

## 2015-09-21 DIAGNOSIS — N186 End stage renal disease: Secondary | ICD-10-CM | POA: Diagnosis not present

## 2015-09-21 DIAGNOSIS — D631 Anemia in chronic kidney disease: Secondary | ICD-10-CM | POA: Diagnosis not present

## 2015-09-21 DIAGNOSIS — D509 Iron deficiency anemia, unspecified: Secondary | ICD-10-CM | POA: Diagnosis not present

## 2015-09-23 DIAGNOSIS — D631 Anemia in chronic kidney disease: Secondary | ICD-10-CM | POA: Diagnosis not present

## 2015-09-23 DIAGNOSIS — D509 Iron deficiency anemia, unspecified: Secondary | ICD-10-CM | POA: Diagnosis not present

## 2015-09-23 DIAGNOSIS — N186 End stage renal disease: Secondary | ICD-10-CM | POA: Diagnosis not present

## 2015-09-26 DIAGNOSIS — D509 Iron deficiency anemia, unspecified: Secondary | ICD-10-CM | POA: Diagnosis not present

## 2015-09-26 DIAGNOSIS — N186 End stage renal disease: Secondary | ICD-10-CM | POA: Diagnosis not present

## 2015-09-26 DIAGNOSIS — D631 Anemia in chronic kidney disease: Secondary | ICD-10-CM | POA: Diagnosis not present

## 2015-09-27 ENCOUNTER — Encounter: Payer: Self-pay | Admitting: Internal Medicine

## 2015-09-27 ENCOUNTER — Ambulatory Visit (INDEPENDENT_AMBULATORY_CARE_PROVIDER_SITE_OTHER): Payer: Medicare Other | Admitting: Internal Medicine

## 2015-09-27 VITALS — BP 125/87 | HR 94 | Temp 98.2°F | Ht 71.0 in | Wt 228.0 lb

## 2015-09-27 DIAGNOSIS — I059 Rheumatic mitral valve disease, unspecified: Secondary | ICD-10-CM

## 2015-09-27 DIAGNOSIS — I058 Other rheumatic mitral valve diseases: Secondary | ICD-10-CM

## 2015-09-27 NOTE — Progress Notes (Signed)
RFV: follow up for endocarditis Subjective:    Patient ID: Timothy Palmer, male    DOB: April 27, 1966, 50 y.o.   MRN: [id number]  HPI HPI: Timothy Palmer is a 50 y.o. male with ESRD, hx of mssa endocarditis S/p mitral valve repair ( 28 mm Sorin Memo 3D ring annuloplasty via right mini thoracotomy approach) in Fall 2015, who recently was treated for prosthetic MV endocarditis, finished 6 wk course of treatment in mid-late November who was admitted for repeat, end of treatment TEE on 08/13/2014 by Dr. Aundra Dubin. The TEE revealed still evidence of residual vegetation.  Normal LV size with mild LV hypertrophy. EF 55-60%. Normal RV size and systolic function. No significant TR. Trivial PI. Trileaflet aortic valve with relatively fixed right coronary cusp, mild aortic stenosis. No aortic insufficiency, no vegetation. The mitral valve is status post repair. There is a 7 mm x 2 mm mobile vegetation attached to the anterior sewing ring. There is trivial MR and probably no significant mitral stenosis. Mild left atrial enlargement. No LAA thrombus. Normal right atrium. Normal caliber aorta with grade III-IV plaque in descending thoracic aorta.   Patient reports being in good state of health, denies fever, chills, nightsweats,. He does report that he has skin tear from paper tape on his entry points from HD. He reinforces it but then sustains skin tears when he pulls off the tape. Appeared to have slight erythema surrounding the area, thus discharged on 2 wk of doxycycline.  He had repeat blood cultures on 08/10/2015 which were no growth to date.  He states that he feels well with exception to feeling fatigue after walking, he denies shortness of breath. Feels like "out of shape". No longer has any chest pain. No orthopnea.   He tolerates HD without difficulty no chills, fevers or nightsweats  Current Outpatient Prescriptions on File Prior to Visit  Medication Sig Dispense Refill  . aspirin 81 MG  tablet Take 81 mg by mouth daily.    . cinacalcet (SENSIPAR) 90 MG tablet Take 1 tablet (90 mg total) by mouth 2 (two) times daily.    Marland Kitchen lisinopril (PRINIVIL,ZESTRIL) 20 MG tablet Take 20 mg by mouth daily.    . multivitamin (RENA-VIT) TABS tablet Take 1 tablet by mouth at bedtime. 30 tablet 0  . Nutritional Supplements (FEEDING SUPPLEMENT, NEPRO CARB STEADY,) LIQD Take 237 mLs by mouth 2 (two) times daily between meals. (Patient taking differently: Take 237 mLs by mouth daily. )    . sevelamer carbonate (RENVELA) 800 MG tablet Take 1,600-2,400 mg by mouth 3 (three) times daily with meals.    Marland Kitchen doxycycline (VIBRA-TABS) 100 MG tablet Take 1 tablet (100 mg total) by mouth 2 (two) times daily. On a full stomach (Patient not taking: Reported on 08/29/2015) 14 tablet 0   No current facility-administered medications on file prior to visit.   Active Ambulatory Problems    Diagnosis Date Noted  . End stage renal disease ([place]) 06/30/2013  . Hypertension 03/22/2014  . Acute bronchitis 03/22/2014  . T wave inversion in EKG 03/22/2014  . QT prolongation 03/22/2014  . Mitral valve vegetation 03/23/2014  . Acute combined systolic and diastolic congestive heart failure ([place]) 03/21/2014  . Severe mitral regurgitation 03/23/2014  . 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 Positive blood culture 11/27/2013  . DVT of upper extremity (deep vein thrombosis) ([place]) 03/04/2014  . Knee pain, left 03/21/2014  .  Chronic diastolic congestive heart failure ([place])   . Septic embolism to left lower extremity 03/25/2014  . 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 S/P minimally invasive mitral valve repair 04/05/2014  . Paroxysmal atrial fibrillation ([place]) 06/13/2014  . Encounter for therapeutic drug monitoring 06/16/2014  . Weakness of left lower extremity  11/14/2014  . Endocarditis 04/05/2015  . Cavitary lesion of lung 04/06/2015  . Mitral valve replaced 04/06/2015  . Anticoagulant long-term use 04/06/2015  . Anemia in chronic kidney disease 04/06/2015  . Coagulopathy ([place]) 04/06/2015  . Multiple pulmonary nodules   . Renal mass   . Mediastinal adenopathy   . ESRD on dialysis ([place])   . Lung nodules   . 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 Hypokalemia 03/22/2014   Past Medical History  Diagnosis Date  . Peripheral vascular disease ([place])   . Pneumonia 2014  . ESRD (end stage renal disease) on dialysis [facility])    Social History  Substance Use Topics  . Smoking status: Never Smoker   . Smokeless tobacco: Never Used  . Alcohol Use: 0.0 oz/week    0 Standard drinks or equivalent per week  family history includes Diabetes in his mother; Hypertension in his brother, father, mother, and sister.  Review of Systems 10 point ros is negative except for fatigue associated with light exercise    Objective:   Physical Exam  BP 125/87 mmHg  Pulse 94  Temp(Src) 98.2 F (36.8 C) (Oral)  Ht 5\' 11"  (1.803 m)  Wt 228 lb (103.42 kg)  BMI 31.81 kg/m2 gen = a xo by 3 in nad HEENT = EOMI, no scleral icterus pulm = CTAB no w/c/r Cors = nl s1,s2, no appreciable murmur heard Ext/skin = left forearm - he has scarring with hypopigmentation as well as hyperpigmentation about his HD access. No erythema, no drainage      Assessment & Plan:  History of endocarditis = he finished his course of treatment and repeat TEE in early February showed vegetation still present, though repeat cultures have been no growth to date, while off of abtx. Suspect that his vegetation will dissolve but it appears that it is sterile. today still Looks good. Will release from care. Return if needed.

## 2015-09-28 DIAGNOSIS — N186 End stage renal disease: Secondary | ICD-10-CM | POA: Diagnosis not present

## 2015-09-28 DIAGNOSIS — Z7901 Long term (current) use of anticoagulants: Secondary | ICD-10-CM | POA: Diagnosis not present

## 2015-09-28 DIAGNOSIS — D509 Iron deficiency anemia, unspecified: Secondary | ICD-10-CM | POA: Diagnosis not present

## 2015-09-28 DIAGNOSIS — I481 Persistent atrial fibrillation: Secondary | ICD-10-CM | POA: Diagnosis not present

## 2015-09-28 DIAGNOSIS — D631 Anemia in chronic kidney disease: Secondary | ICD-10-CM | POA: Diagnosis not present

## 2015-09-28 DIAGNOSIS — Z5181 Encounter for therapeutic drug level monitoring: Secondary | ICD-10-CM | POA: Diagnosis not present

## 2015-09-30 DIAGNOSIS — D631 Anemia in chronic kidney disease: Secondary | ICD-10-CM | POA: Diagnosis not present

## 2015-09-30 DIAGNOSIS — N186 End stage renal disease: Secondary | ICD-10-CM | POA: Diagnosis not present

## 2015-09-30 DIAGNOSIS — D509 Iron deficiency anemia, unspecified: Secondary | ICD-10-CM | POA: Diagnosis not present

## 2015-10-03 DIAGNOSIS — D631 Anemia in chronic kidney disease: Secondary | ICD-10-CM | POA: Diagnosis not present

## 2015-10-03 DIAGNOSIS — N186 End stage renal disease: Secondary | ICD-10-CM | POA: Diagnosis not present

## 2015-10-03 DIAGNOSIS — D509 Iron deficiency anemia, unspecified: Secondary | ICD-10-CM | POA: Diagnosis not present

## 2015-10-05 DIAGNOSIS — D631 Anemia in chronic kidney disease: Secondary | ICD-10-CM | POA: Diagnosis not present

## 2015-10-05 DIAGNOSIS — N186 End stage renal disease: Secondary | ICD-10-CM | POA: Diagnosis not present

## 2015-10-05 DIAGNOSIS — D509 Iron deficiency anemia, unspecified: Secondary | ICD-10-CM | POA: Diagnosis not present

## 2015-10-06 DIAGNOSIS — N186 End stage renal disease: Secondary | ICD-10-CM | POA: Diagnosis not present

## 2015-10-06 DIAGNOSIS — I120 Hypertensive chronic kidney disease with stage 5 chronic kidney disease or end stage renal disease: Secondary | ICD-10-CM | POA: Diagnosis not present

## 2015-10-06 DIAGNOSIS — Z992 Dependence on renal dialysis: Secondary | ICD-10-CM | POA: Diagnosis not present

## 2015-10-07 DIAGNOSIS — D509 Iron deficiency anemia, unspecified: Secondary | ICD-10-CM | POA: Diagnosis not present

## 2015-10-07 DIAGNOSIS — N186 End stage renal disease: Secondary | ICD-10-CM | POA: Diagnosis not present

## 2015-10-07 DIAGNOSIS — D631 Anemia in chronic kidney disease: Secondary | ICD-10-CM | POA: Diagnosis not present

## 2015-10-10 DIAGNOSIS — N186 End stage renal disease: Secondary | ICD-10-CM | POA: Diagnosis not present

## 2015-10-10 DIAGNOSIS — D509 Iron deficiency anemia, unspecified: Secondary | ICD-10-CM | POA: Diagnosis not present

## 2015-10-10 DIAGNOSIS — D631 Anemia in chronic kidney disease: Secondary | ICD-10-CM | POA: Diagnosis not present

## 2015-10-12 DIAGNOSIS — D509 Iron deficiency anemia, unspecified: Secondary | ICD-10-CM | POA: Diagnosis not present

## 2015-10-12 DIAGNOSIS — D631 Anemia in chronic kidney disease: Secondary | ICD-10-CM | POA: Diagnosis not present

## 2015-10-12 DIAGNOSIS — N186 End stage renal disease: Secondary | ICD-10-CM | POA: Diagnosis not present

## 2015-10-14 DIAGNOSIS — D509 Iron deficiency anemia, unspecified: Secondary | ICD-10-CM | POA: Diagnosis not present

## 2015-10-14 DIAGNOSIS — N186 End stage renal disease: Secondary | ICD-10-CM | POA: Diagnosis not present

## 2015-10-14 DIAGNOSIS — D631 Anemia in chronic kidney disease: Secondary | ICD-10-CM | POA: Diagnosis not present

## 2015-10-17 DIAGNOSIS — N186 End stage renal disease: Secondary | ICD-10-CM | POA: Diagnosis not present

## 2015-10-17 DIAGNOSIS — D631 Anemia in chronic kidney disease: Secondary | ICD-10-CM | POA: Diagnosis not present

## 2015-10-17 DIAGNOSIS — D509 Iron deficiency anemia, unspecified: Secondary | ICD-10-CM | POA: Diagnosis not present

## 2015-10-19 DIAGNOSIS — D631 Anemia in chronic kidney disease: Secondary | ICD-10-CM | POA: Diagnosis not present

## 2015-10-19 DIAGNOSIS — D509 Iron deficiency anemia, unspecified: Secondary | ICD-10-CM | POA: Diagnosis not present

## 2015-10-19 DIAGNOSIS — N186 End stage renal disease: Secondary | ICD-10-CM | POA: Diagnosis not present

## 2015-10-21 DIAGNOSIS — D509 Iron deficiency anemia, unspecified: Secondary | ICD-10-CM | POA: Diagnosis not present

## 2015-10-21 DIAGNOSIS — D631 Anemia in chronic kidney disease: Secondary | ICD-10-CM | POA: Diagnosis not present

## 2015-10-21 DIAGNOSIS — N186 End stage renal disease: Secondary | ICD-10-CM | POA: Diagnosis not present

## 2015-10-24 DIAGNOSIS — D509 Iron deficiency anemia, unspecified: Secondary | ICD-10-CM | POA: Diagnosis not present

## 2015-10-24 DIAGNOSIS — D631 Anemia in chronic kidney disease: Secondary | ICD-10-CM | POA: Diagnosis not present

## 2015-10-24 DIAGNOSIS — N186 End stage renal disease: Secondary | ICD-10-CM | POA: Diagnosis not present

## 2015-10-26 DIAGNOSIS — N186 End stage renal disease: Secondary | ICD-10-CM | POA: Diagnosis not present

## 2015-10-26 DIAGNOSIS — D509 Iron deficiency anemia, unspecified: Secondary | ICD-10-CM | POA: Diagnosis not present

## 2015-10-26 DIAGNOSIS — D631 Anemia in chronic kidney disease: Secondary | ICD-10-CM | POA: Diagnosis not present

## 2015-10-28 DIAGNOSIS — D631 Anemia in chronic kidney disease: Secondary | ICD-10-CM | POA: Diagnosis not present

## 2015-10-28 DIAGNOSIS — D509 Iron deficiency anemia, unspecified: Secondary | ICD-10-CM | POA: Diagnosis not present

## 2015-10-28 DIAGNOSIS — N186 End stage renal disease: Secondary | ICD-10-CM | POA: Diagnosis not present

## 2015-10-30 ENCOUNTER — Ambulatory Visit (INDEPENDENT_AMBULATORY_CARE_PROVIDER_SITE_OTHER): Payer: Medicare Other | Admitting: Thoracic Surgery (Cardiothoracic Vascular Surgery)

## 2015-10-30 ENCOUNTER — Encounter: Payer: Self-pay | Admitting: Thoracic Surgery (Cardiothoracic Vascular Surgery)

## 2015-10-30 VITALS — BP 120/72 | HR 97 | Resp 16 | Ht 71.0 in | Wt 237.0 lb

## 2015-10-30 DIAGNOSIS — T826XXD Infection and inflammatory reaction due to cardiac valve prosthesis, subsequent encounter: Secondary | ICD-10-CM

## 2015-10-30 DIAGNOSIS — Z9889 Other specified postprocedural states: Secondary | ICD-10-CM | POA: Diagnosis not present

## 2015-10-30 DIAGNOSIS — I38 Endocarditis, valve unspecified: Secondary | ICD-10-CM

## 2015-10-30 DIAGNOSIS — I330 Acute and subacute infective endocarditis: Secondary | ICD-10-CM

## 2015-10-30 NOTE — Patient Instructions (Signed)

## 2015-10-30 NOTE — Progress Notes (Signed)
[address]       [place] [address]             [phone]     CARDIOTHORACIC SURGERY OFFICE NOTE  Referring Provider is [facility], Elby Showers, MD PCP is Louis Meckel, MD   HPI:  Patient returns to the office today for follow-up prosthetic valve endocarditis. 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 He was last seen here in our office on 07/24/2015.  Since then he has done well clinically. He was seen in follow-up by Dr. Aundra Dubin underwent repeat transesophageal echocardiogram.  There remained a 7 x 2 mm mobile vegetation attached to the anterior sewing ring of the mitral valve with trivial mitral regurgitation. He has been seen in follow-up by Dr. Baxter Flattery from Infectious Disease and antibiotics have been stopped.  He returns to office for routine follow-up today. He states that he is feeling well. He has made significant improvement over the last few months. He still gets tired with activity but he denies any symptoms of exertional shortness of breath or chest discomfort. The pain in his legs has improved to some degree. He denies any history of fevers, chills, or night sweats.   Current Outpatient Prescriptions  Medication Sig Dispense Refill  . aspirin 81 MG tablet Take 81 mg by mouth daily.    . cinacalcet (SENSIPAR) 90 MG tablet Take 1 tablet (90 mg total) by mouth 2 (two) times daily.    Marland Kitchen lisinopril (PRINIVIL,ZESTRIL) 20 MG tablet Take 20 mg by mouth daily.    . multivitamin (RENA-VIT) TABS tablet Take 1 tablet by mouth at bedtime. 30 tablet 0  . Nutritional Supplements (FEEDING SUPPLEMENT, NEPRO CARB STEADY,) LIQD Take 237 mLs by mouth 2 (two) times daily between meals. (Patient  taking differently: Take 237 mLs by mouth daily. )    . sevelamer carbonate (RENVELA) 800 MG tablet Take 1,600-2,400 mg by mouth 3 (three) times daily with meals.     No current facility-administered medications for this visit.      Physical Exam:   BP 120/72 mmHg  Pulse 97  Resp 16  Ht 5\' 11"  (1.803 m)  Wt 237 lb (107.502 kg)  BMI 33.07 kg/m2  SpO2 96%  General:  Well-appearing  Chest:   Clear to auscultation  CV:   Regular rate and rhythm without murmur  Incisions:  n/a  Abdomen:  Soft nontender  Extremities:  Warm and well-perfused  Diagnostic Tests:  Transesophageal Echocardiography  Patient: Timothy Palmer, Timothy Palmer MR #: [id number] Study Date: 08/18/2015 Gender: M Age: 50 Height: 180.3 cm Weight: 101.8 kg BSA: 2.29 m^2 Pt. Status: Room:  SONOGRAPHER Florentina Jenny, RDCS ORDERING Loralie Champagne, M.D. PERFORMING Loralie Champagne, M.D.  cc:  ------------------------------------------------------------------- LV EF: 55% - 60%  ------------------------------------------------------------------- Indications: Bacteremia 790.7.  ------------------------------------------------------------------- Study Conclusions  - Left ventricle: The cavity size was normal. There was mild  concentric hypertrophy. Systolic function was normal. The  estimated ejection fraction was in the range of 55% to 60%. Wall  motion was normal; there were no regional wall motion  abnormalities. - Aortic valve: Trileaflet aortic valve with relatively fixed right  coronary cusp, mild aortic stenosis. No aortic insufficiency, no  vegetation. - Aorta: Normal caliber aorta with grade III-IV plaque in  descending  thoracic aorta. - Mitral valve: The mitral valve is status post repair. There is a  7 mm x 2 mm mobile vegetation attached to the anterior sewing  ring. There is trivial MR and probably no significant mitral  stenosis. -  Left atrium: The atrium was mildly dilated. No evidence of  thrombus in the atrial cavity or appendage. - Right ventricle: The cavity size was normal. Systolic function  was normal. - Right atrium: No evidence of thrombus in the atrial cavity or  appendage. - Tricuspid valve: No evidence of vegetation. - Pulmonic valve: No evidence of vegetation.  Impressions:  - Persistent mitral valve vegetation (small) attached to the  anterior sewing ring. Will send blood cultures and consult ID.  Diagnostic transesophageal echocardiography. 2D and color Doppler. Birthdate: Patient birthdate: August 16, 1965. Age: Patient is 50 yr old old. Sex: Gender: male. BMI: 31.3 kg/m^2. Blood pressure: 145/115 Patient status: Outpatient. Study date: Study date: 08/18/2015. Study time: 08:12 AM. Location: Endoscopy.  -------------------------------------------------------------------  ------------------------------------------------------------------- Left ventricle: The cavity size was normal. There was mild concentric hypertrophy. Systolic function was normal. The estimated ejection fraction was in the range of 55% to 60%. Wall motion was normal; there were no regional wall motion abnormalities.  ------------------------------------------------------------------- Aortic valve: Trileaflet aortic valve with relatively fixed right coronary cusp, mild aortic stenosis. No aortic insufficiency, no vegetation.  ------------------------------------------------------------------- Aorta: Normal caliber aorta with grade III-IV plaque in descending thoracic aorta.  ------------------------------------------------------------------- Mitral valve: The mitral valve is status post repair. There is a 7 mm x 2 mm mobile vegetation attached to the anterior sewing ring. There is trivial MR and probably no significant mitral  stenosis.  ------------------------------------------------------------------- Left atrium: The atrium was mildly dilated. No evidence of thrombus in the atrial cavity or appendage.  ------------------------------------------------------------------- Right ventricle: The cavity size was normal. Systolic function was normal.  ------------------------------------------------------------------- Pulmonic valve: Structurally normal valve. Cusp separation was normal. No evidence of vegetation. Doppler: There was trivial regurgitation.  ------------------------------------------------------------------- Tricuspid valve: No evidence of vegetation. Doppler: There was no significant regurgitation.  ------------------------------------------------------------------- Right atrium: The atrium was normal in size. No evidence of thrombus in the atrial cavity or appendage.  ------------------------------------------------------------------- Pericardium: There was no pericardial effusion.  ------------------------------------------------------------------- Post procedure conclusions Ascending Aorta:  - Normal caliber aorta with grade III-IV plaque in descending  thoracic aorta.  ------------------------------------------------------------------- Prepared and Electronically Authenticated by  Loralie Champagne, M.D. 2017-02-11T17:01:04   Impression:  Patient remains clinically stable and without any signs or symptoms to suggest ongoing prosthetic valve endocarditis or worsening congestive heart failure.  Plan:  The patient will return next September, approximately 2 years following his original surgery.  The patient has been reminded regarding the importance of dental hygiene and the lifelong need for antibiotic prophylaxis for all dental cleanings and other related invasive procedures.  I spent in excess of 10 minutes during the conduct of this office  consultation and >50% of this time involved direct face-to-face encounter with the patient for counseling and/or coordination of their care.  Valentina Gu. Roxy Manns, MD 10/30/2015 2:10 PM

## 2015-10-31 DIAGNOSIS — N186 End stage renal disease: Secondary | ICD-10-CM | POA: Diagnosis not present

## 2015-10-31 DIAGNOSIS — D509 Iron deficiency anemia, unspecified: Secondary | ICD-10-CM | POA: Diagnosis not present

## 2015-10-31 DIAGNOSIS — D631 Anemia in chronic kidney disease: Secondary | ICD-10-CM | POA: Diagnosis not present

## 2015-11-02 DIAGNOSIS — N186 End stage renal disease: Secondary | ICD-10-CM | POA: Diagnosis not present

## 2015-11-02 DIAGNOSIS — D631 Anemia in chronic kidney disease: Secondary | ICD-10-CM | POA: Diagnosis not present

## 2015-11-02 DIAGNOSIS — D509 Iron deficiency anemia, unspecified: Secondary | ICD-10-CM | POA: Diagnosis not present

## 2015-11-02 DIAGNOSIS — I481 Persistent atrial fibrillation: Secondary | ICD-10-CM | POA: Diagnosis not present

## 2015-11-02 DIAGNOSIS — E1129 Type 2 diabetes mellitus with other diabetic kidney complication: Secondary | ICD-10-CM | POA: Diagnosis not present

## 2015-11-02 DIAGNOSIS — Z7901 Long term (current) use of anticoagulants: Secondary | ICD-10-CM | POA: Diagnosis not present

## 2015-11-02 DIAGNOSIS — Z5181 Encounter for therapeutic drug level monitoring: Secondary | ICD-10-CM | POA: Diagnosis not present

## 2015-11-04 DIAGNOSIS — D509 Iron deficiency anemia, unspecified: Secondary | ICD-10-CM | POA: Diagnosis not present

## 2015-11-04 DIAGNOSIS — N186 End stage renal disease: Secondary | ICD-10-CM | POA: Diagnosis not present

## 2015-11-04 DIAGNOSIS — D631 Anemia in chronic kidney disease: Secondary | ICD-10-CM | POA: Diagnosis not present

## 2015-11-05 DIAGNOSIS — Z992 Dependence on renal dialysis: Secondary | ICD-10-CM | POA: Diagnosis not present

## 2015-11-05 DIAGNOSIS — I120 Hypertensive chronic kidney disease with stage 5 chronic kidney disease or end stage renal disease: Secondary | ICD-10-CM | POA: Diagnosis not present

## 2015-11-05 DIAGNOSIS — N186 End stage renal disease: Secondary | ICD-10-CM | POA: Diagnosis not present

## 2015-11-07 DIAGNOSIS — D631 Anemia in chronic kidney disease: Secondary | ICD-10-CM | POA: Diagnosis not present

## 2015-11-07 DIAGNOSIS — D509 Iron deficiency anemia, unspecified: Secondary | ICD-10-CM | POA: Diagnosis not present

## 2015-11-07 DIAGNOSIS — N186 End stage renal disease: Secondary | ICD-10-CM | POA: Diagnosis not present

## 2015-11-09 DIAGNOSIS — N186 End stage renal disease: Secondary | ICD-10-CM | POA: Diagnosis not present

## 2015-11-09 DIAGNOSIS — D631 Anemia in chronic kidney disease: Secondary | ICD-10-CM | POA: Diagnosis not present

## 2015-11-09 DIAGNOSIS — D509 Iron deficiency anemia, unspecified: Secondary | ICD-10-CM | POA: Diagnosis not present

## 2015-11-11 DIAGNOSIS — D509 Iron deficiency anemia, unspecified: Secondary | ICD-10-CM | POA: Diagnosis not present

## 2015-11-11 DIAGNOSIS — D631 Anemia in chronic kidney disease: Secondary | ICD-10-CM | POA: Diagnosis not present

## 2015-11-11 DIAGNOSIS — N186 End stage renal disease: Secondary | ICD-10-CM | POA: Diagnosis not present

## 2015-11-14 DIAGNOSIS — D631 Anemia in chronic kidney disease: Secondary | ICD-10-CM | POA: Diagnosis not present

## 2015-11-14 DIAGNOSIS — D509 Iron deficiency anemia, unspecified: Secondary | ICD-10-CM | POA: Diagnosis not present

## 2015-11-14 DIAGNOSIS — N186 End stage renal disease: Secondary | ICD-10-CM | POA: Diagnosis not present

## 2015-11-16 DIAGNOSIS — N186 End stage renal disease: Secondary | ICD-10-CM | POA: Diagnosis not present

## 2015-11-16 DIAGNOSIS — D509 Iron deficiency anemia, unspecified: Secondary | ICD-10-CM | POA: Diagnosis not present

## 2015-11-16 DIAGNOSIS — D631 Anemia in chronic kidney disease: Secondary | ICD-10-CM | POA: Diagnosis not present

## 2015-11-18 DIAGNOSIS — N186 End stage renal disease: Secondary | ICD-10-CM | POA: Diagnosis not present

## 2015-11-18 DIAGNOSIS — D631 Anemia in chronic kidney disease: Secondary | ICD-10-CM | POA: Diagnosis not present

## 2015-11-18 DIAGNOSIS — D509 Iron deficiency anemia, unspecified: Secondary | ICD-10-CM | POA: Diagnosis not present

## 2015-11-21 DIAGNOSIS — D509 Iron deficiency anemia, unspecified: Secondary | ICD-10-CM | POA: Diagnosis not present

## 2015-11-21 DIAGNOSIS — D631 Anemia in chronic kidney disease: Secondary | ICD-10-CM | POA: Diagnosis not present

## 2015-11-21 DIAGNOSIS — N186 End stage renal disease: Secondary | ICD-10-CM | POA: Diagnosis not present

## 2015-11-23 DIAGNOSIS — D509 Iron deficiency anemia, unspecified: Secondary | ICD-10-CM | POA: Diagnosis not present

## 2015-11-23 DIAGNOSIS — D631 Anemia in chronic kidney disease: Secondary | ICD-10-CM | POA: Diagnosis not present

## 2015-11-23 DIAGNOSIS — N186 End stage renal disease: Secondary | ICD-10-CM | POA: Diagnosis not present

## 2015-11-24 DIAGNOSIS — Z992 Dependence on renal dialysis: Secondary | ICD-10-CM | POA: Diagnosis not present

## 2015-11-24 DIAGNOSIS — T82868D Thrombosis of vascular prosthetic devices, implants and grafts, subsequent encounter: Secondary | ICD-10-CM | POA: Diagnosis not present

## 2015-11-24 DIAGNOSIS — N186 End stage renal disease: Secondary | ICD-10-CM | POA: Diagnosis not present

## 2015-11-24 DIAGNOSIS — I871 Compression of vein: Secondary | ICD-10-CM | POA: Diagnosis not present

## 2015-11-25 DIAGNOSIS — D631 Anemia in chronic kidney disease: Secondary | ICD-10-CM | POA: Diagnosis not present

## 2015-11-25 DIAGNOSIS — N186 End stage renal disease: Secondary | ICD-10-CM | POA: Diagnosis not present

## 2015-11-25 DIAGNOSIS — D509 Iron deficiency anemia, unspecified: Secondary | ICD-10-CM | POA: Diagnosis not present

## 2015-11-28 DIAGNOSIS — D509 Iron deficiency anemia, unspecified: Secondary | ICD-10-CM | POA: Diagnosis not present

## 2015-11-28 DIAGNOSIS — D631 Anemia in chronic kidney disease: Secondary | ICD-10-CM | POA: Diagnosis not present

## 2015-11-28 DIAGNOSIS — N186 End stage renal disease: Secondary | ICD-10-CM | POA: Diagnosis not present

## 2015-11-30 DIAGNOSIS — Z5181 Encounter for therapeutic drug level monitoring: Secondary | ICD-10-CM | POA: Diagnosis not present

## 2015-11-30 DIAGNOSIS — D631 Anemia in chronic kidney disease: Secondary | ICD-10-CM | POA: Diagnosis not present

## 2015-11-30 DIAGNOSIS — D509 Iron deficiency anemia, unspecified: Secondary | ICD-10-CM | POA: Diagnosis not present

## 2015-11-30 DIAGNOSIS — N186 End stage renal disease: Secondary | ICD-10-CM | POA: Diagnosis not present

## 2015-11-30 DIAGNOSIS — Z7901 Long term (current) use of anticoagulants: Secondary | ICD-10-CM | POA: Diagnosis not present

## 2015-11-30 DIAGNOSIS — I481 Persistent atrial fibrillation: Secondary | ICD-10-CM | POA: Diagnosis not present

## 2015-12-01 ENCOUNTER — Other Ambulatory Visit: Payer: Self-pay | Admitting: *Deleted

## 2015-12-01 DIAGNOSIS — N186 End stage renal disease: Secondary | ICD-10-CM

## 2015-12-01 DIAGNOSIS — Z01810 Encounter for preprocedural cardiovascular examination: Secondary | ICD-10-CM

## 2015-12-02 DIAGNOSIS — N186 End stage renal disease: Secondary | ICD-10-CM | POA: Diagnosis not present

## 2015-12-02 DIAGNOSIS — D631 Anemia in chronic kidney disease: Secondary | ICD-10-CM | POA: Diagnosis not present

## 2015-12-02 DIAGNOSIS — D509 Iron deficiency anemia, unspecified: Secondary | ICD-10-CM | POA: Diagnosis not present

## 2015-12-05 DIAGNOSIS — D509 Iron deficiency anemia, unspecified: Secondary | ICD-10-CM | POA: Diagnosis not present

## 2015-12-05 DIAGNOSIS — N186 End stage renal disease: Secondary | ICD-10-CM | POA: Diagnosis not present

## 2015-12-05 DIAGNOSIS — D631 Anemia in chronic kidney disease: Secondary | ICD-10-CM | POA: Diagnosis not present

## 2015-12-06 DIAGNOSIS — N186 End stage renal disease: Secondary | ICD-10-CM | POA: Diagnosis not present

## 2015-12-06 DIAGNOSIS — Z992 Dependence on renal dialysis: Secondary | ICD-10-CM | POA: Diagnosis not present

## 2015-12-06 DIAGNOSIS — I120 Hypertensive chronic kidney disease with stage 5 chronic kidney disease or end stage renal disease: Secondary | ICD-10-CM | POA: Diagnosis not present

## 2015-12-07 DIAGNOSIS — D509 Iron deficiency anemia, unspecified: Secondary | ICD-10-CM | POA: Diagnosis not present

## 2015-12-07 DIAGNOSIS — N186 End stage renal disease: Secondary | ICD-10-CM | POA: Diagnosis not present

## 2015-12-07 DIAGNOSIS — D631 Anemia in chronic kidney disease: Secondary | ICD-10-CM | POA: Diagnosis not present

## 2015-12-08 ENCOUNTER — Encounter: Payer: Self-pay | Admitting: Surgery

## 2015-12-09 DIAGNOSIS — N186 End stage renal disease: Secondary | ICD-10-CM | POA: Diagnosis not present

## 2015-12-09 DIAGNOSIS — D509 Iron deficiency anemia, unspecified: Secondary | ICD-10-CM | POA: Diagnosis not present

## 2015-12-09 DIAGNOSIS — D631 Anemia in chronic kidney disease: Secondary | ICD-10-CM | POA: Diagnosis not present

## 2015-12-12 DIAGNOSIS — N186 End stage renal disease: Secondary | ICD-10-CM | POA: Diagnosis not present

## 2015-12-12 DIAGNOSIS — D509 Iron deficiency anemia, unspecified: Secondary | ICD-10-CM | POA: Diagnosis not present

## 2015-12-12 DIAGNOSIS — D631 Anemia in chronic kidney disease: Secondary | ICD-10-CM | POA: Diagnosis not present

## 2015-12-14 DIAGNOSIS — N186 End stage renal disease: Secondary | ICD-10-CM | POA: Diagnosis not present

## 2015-12-14 DIAGNOSIS — D509 Iron deficiency anemia, unspecified: Secondary | ICD-10-CM | POA: Diagnosis not present

## 2015-12-14 DIAGNOSIS — D631 Anemia in chronic kidney disease: Secondary | ICD-10-CM | POA: Diagnosis not present

## 2015-12-16 DIAGNOSIS — D631 Anemia in chronic kidney disease: Secondary | ICD-10-CM | POA: Diagnosis not present

## 2015-12-16 DIAGNOSIS — N186 End stage renal disease: Secondary | ICD-10-CM | POA: Diagnosis not present

## 2015-12-16 DIAGNOSIS — D509 Iron deficiency anemia, unspecified: Secondary | ICD-10-CM | POA: Diagnosis not present

## 2015-12-18 ENCOUNTER — Other Ambulatory Visit: Payer: Self-pay

## 2015-12-18 ENCOUNTER — Ambulatory Visit (INDEPENDENT_AMBULATORY_CARE_PROVIDER_SITE_OTHER)
Admission: RE | Admit: 2015-12-18 | Discharge: 2015-12-18 | Disposition: A | Discharge status: Home / Self Care | Payer: Medicare Other | Source: Ambulatory Visit | Attending: Surgery | Admitting: Surgery

## 2015-12-18 ENCOUNTER — Ambulatory Visit (HOSPITAL_COMMUNITY)
Admission: RE | Admit: 2015-12-18 | Discharge: 2015-12-18 | Disposition: A | Discharge status: Home / Self Care | Payer: Medicare Other | Source: Ambulatory Visit | Attending: Surgery | Admitting: Surgery

## 2015-12-18 ENCOUNTER — Ambulatory Visit (INDEPENDENT_AMBULATORY_CARE_PROVIDER_SITE_OTHER): Payer: Medicare Other | Admitting: Surgery

## 2015-12-18 ENCOUNTER — Encounter: Payer: Self-pay | Admitting: Surgery

## 2015-12-18 VITALS — BP 189/115 | HR 85 | Temp 97.7°F | Resp 18 | Wt 229.0 lb

## 2015-12-18 DIAGNOSIS — Z01810 Encounter for preprocedural cardiovascular examination: Secondary | ICD-10-CM | POA: Insufficient documentation

## 2015-12-18 DIAGNOSIS — N186 End stage renal disease: Secondary | ICD-10-CM

## 2015-12-18 DIAGNOSIS — I132 Hypertensive heart and chronic kidney disease with heart failure and with stage 5 chronic kidney disease, or end stage renal disease: Secondary | ICD-10-CM | POA: Diagnosis not present

## 2015-12-18 DIAGNOSIS — Z992 Dependence on renal dialysis: Secondary | ICD-10-CM | POA: Insufficient documentation

## 2015-12-18 DIAGNOSIS — R938 Abnormal findings on diagnostic imaging of other specified body structures: Secondary | ICD-10-CM | POA: Diagnosis not present

## 2015-12-18 DIAGNOSIS — I5042 Chronic combined systolic (congestive) and diastolic (congestive) heart failure: Secondary | ICD-10-CM | POA: Insufficient documentation

## 2015-12-18 NOTE — Progress Notes (Signed)
[facility]  Patient name: Timothy Palmer MRN: [id number] DOB: 08/18/1965 Sex: male  REASON FOR VISIT: Dialysis access  HPI: Timothy Palmer is a 50 y.o. male who has a history of a left radiocephalic fistula placed many years ago which has thrombosed.  He comes in today for new access.  He has a right-sided catheter.  He has a history of a DVT in the right arm.  He is medically managed for hypertension.  He has been treated for endocarditis in the past with prosthetic valve  Past Medical History  Diagnosis Date  . Hypertension   . Peripheral vascular disease ([place])   . Pneumonia 2014  . ESRD (end stage renal disease) on dialysis ([place])     [place],Dialysis- T,Th,S  . Severe mitral regurgitation 03/23/2014    by TEE  . DVT of upper extremity (deep vein thrombosis) ([place]) 03/04/2014    Right arm  . Acute combined systolic and diastolic congestive heart failure ([place]) 03/21/2014  . Chronic diastolic congestive heart failure ([place])   . 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 Septic embolism to left lower extremity 03/25/2014  . 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 The aneurysm measures 1.4 x 1.1 cm which is  significantly larger than the 4.5 mm parent vessel.   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 Prosthetic valve endocarditis ([place]) 04/12/2015    Vegetation on atrial surface of repaired native mitral valve seen on TEE    Family  History  Problem Relation Age of Onset  . Diabetes Mother   . Hypertension Mother   . Hypertension Father   . Hypertension Sister   . Hypertension Brother     SOCIAL HISTORY: Social History  Substance Use Topics  . Smoking status: Never Smoker   . Smokeless tobacco: Never Used  . Alcohol Use: 0.0 oz/week    0 Standard drinks or equivalent per week    No Known Allergies  Current Outpatient Prescriptions  Medication Sig Dispense Refill  . aspirin 81 MG tablet Take 81 mg by mouth daily.    . cinacalcet (SENSIPAR) 90 MG tablet Take 1 tablet (90 mg total) by mouth 2 (two) times daily.    Marland Kitchen lisinopril (PRINIVIL,ZESTRIL) 20 MG tablet Take 20 mg by mouth daily.    . multivitamin (RENA-VIT) TABS tablet Take 1 tablet by mouth at bedtime. 30 tablet 0  . Nutritional Supplements (FEEDING SUPPLEMENT, NEPRO CARB STEADY,) LIQD Take 237 mLs by mouth 2 (two) times daily between meals. (Patient taking differently: Take 237 mLs by mouth daily. )    . sevelamer carbonate (RENVELA) 800 MG tablet Take 1,600-2,400 mg by mouth 3 (three) times daily with meals.     No current facility-administered medications for this visit.    REVIEW OF SYSTEMS:  [X]  denotes positive finding, [ ]  denotes negative finding Cardiac  Comments:  Chest pain or chest pressure:    Shortness of breath upon exertion:    Short of breath when lying flat:    Irregular heart rhythm:  Vascular    Pain in calf, thigh, or hip brought on by ambulation:    Pain in feet at night that wakes you up from your sleep:     Blood clot in your veins:    Leg swelling:         Pulmonary    Oxygen at home:    Productive cough:     Wheezing:         Neurologic    Sudden weakness in arms or legs:     Sudden numbness in arms or legs:     Sudden onset of difficulty speaking or slurred speech:    Temporary loss of vision in one eye:     Problems with dizziness:         Gastrointestinal    Blood in stool:     Vomited blood:           Genitourinary    Burning when urinating:     Blood in urine:        Psychiatric    Major depression:         Hematologic    Bleeding problems:    Problems with blood clotting too easily:        Skin    Rashes or ulcers:        Constitutional    Fever or chills:      PHYSICAL EXAM: Filed Vitals:   12/18/15 0904  BP: 189/115  Pulse: 85  Temp: 97.7 F (36.5 C)  TempSrc: Oral  Resp: 18  Weight: 229 lb (103.874 kg)  SpO2: 98%    GENERAL: The patient is a well-nourished male, in no acute distress. The vital signs are documented above. CARDIAC: There is a regular rate and rhythm.  VASCULAR: Palpable brachial pulse on the left PULMONARY: There is good air exchange bilaterally without wheezing or rales. MUSCULOSKELETAL: There are no major deformities or cyanosis. NEUROLOGIC: No focal weakness or paresthesias are detected. SKIN: There are no ulcers or rashes noted. PSYCHIATRIC: The patient has a normal affect.  DATA:  I have reviewed his vein mapping.  He has an excellent left basilic vein.  The left cephalic vein is small in caliber.  The right brachial artery is ectatic measuring 1.5 cm.  MEDICAL ISSUES: This is a right-handed patient.  I have discussed proceeding with a first stage left basilic vein transposition.  He understands that this is a 2 stage procedure.  We discussed the risks and benefits of the operation including the need for second operation, the risk of arm swelling, steal syndrome, and the need for future interventions.  All of his questions were answered.  Because of scheduling limitations for me, I am putting him on for June 21 with Dr. Shelba Flake, MD [facility] [phone] Pager [phone]

## 2015-12-19 DIAGNOSIS — D509 Iron deficiency anemia, unspecified: Secondary | ICD-10-CM | POA: Diagnosis not present

## 2015-12-19 DIAGNOSIS — D631 Anemia in chronic kidney disease: Secondary | ICD-10-CM | POA: Diagnosis not present

## 2015-12-19 DIAGNOSIS — N186 End stage renal disease: Secondary | ICD-10-CM | POA: Diagnosis not present

## 2015-12-21 DIAGNOSIS — D631 Anemia in chronic kidney disease: Secondary | ICD-10-CM | POA: Diagnosis not present

## 2015-12-21 DIAGNOSIS — D509 Iron deficiency anemia, unspecified: Secondary | ICD-10-CM | POA: Diagnosis not present

## 2015-12-21 DIAGNOSIS — N186 End stage renal disease: Secondary | ICD-10-CM | POA: Diagnosis not present

## 2015-12-23 DIAGNOSIS — D631 Anemia in chronic kidney disease: Secondary | ICD-10-CM | POA: Diagnosis not present

## 2015-12-23 DIAGNOSIS — N186 End stage renal disease: Secondary | ICD-10-CM | POA: Diagnosis not present

## 2015-12-23 DIAGNOSIS — D509 Iron deficiency anemia, unspecified: Secondary | ICD-10-CM | POA: Diagnosis not present

## 2015-12-26 DIAGNOSIS — N186 End stage renal disease: Secondary | ICD-10-CM | POA: Diagnosis not present

## 2015-12-26 DIAGNOSIS — D509 Iron deficiency anemia, unspecified: Secondary | ICD-10-CM | POA: Diagnosis not present

## 2015-12-26 DIAGNOSIS — D631 Anemia in chronic kidney disease: Secondary | ICD-10-CM | POA: Diagnosis not present

## 2015-12-26 MED ORDER — CEFUROXIME SODIUM 1.5 G IJ SOLR
1.5000 g | INTRAMUSCULAR | Status: AC
  Administered 2015-12-27: 1.5 g via INTRAVENOUS
  Filled 2015-12-26: qty 1.5

## 2015-12-26 NOTE — Progress Notes (Signed)
Anesthesia Chart Review: SAME DAY WORK-UP.  Patient is a 50 year old male scheduled for first stage left arm basilic vein transposition on 12/27/15 by Dr. Bridgett Larsson.  History includes ESRD on HD TTS, MSSA bacterial endocarditis with septic embolus to spleen and small intestine and left popliteal artery s/p left popliteal embolectomy with recurrent ischemia s/p left popliteal-popliteal BPG 03/25/14, bacterial endocarditis with severe MR s/p minimally invasive MV repair 04/05/14 with prosthetic valve endocarditis 05/2015 (s/p six weeks antibiotics per ID; 08/2015 TEE showed persistent vegetation but no growth on cultures; suspected sterile vegetation that will dissolve over time), chronic combined CHF, post-operative afib, 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 Donnetta Hutching), non-smoker, HTN, right radial DVT 03/04/14.   Nephrologist is Dr. Corliss Parish. ID is Dr. Baxter Flattery.  Vascular surgeon is Dr. Donnetta Hutching. Next PVD follow-up is scheduled with Barnabas Lister, NP on 02/26/16. (I did sent a staff message to [facility] RN Colletta Maryland to discuss with vascular surgeon future recommendations for SMA aneurysm follow-up.) Pulmonologist is Dr. Ashok Cordia. CT surgeon is Dr. Roxy Manns. Primary/HF cardiologist is Dr. Aundra Dubin.  Meds list is still pending. Amiodarone and warfarin discontinued at his 05/19/15 cardiology visit.  04/05/15 EKG: NSR, incomplete right BBB, possible RVH, T wave abnormality, consider anterior ischemia, prolonged QT (QT 456ms, QTc 548ms).  08/18/15 TEE: Study Conclusions - Left ventricle: The cavity size was normal. There was mild  concentric hypertrophy. Systolic function was normal. The  estimated ejection fraction was in the range of 55% to 60%. Wall  motion was normal; there were no regional wall motion  abnormalities. - Aortic valve: Trileaflet aortic valve with relatively fixed right  coronary cusp, mild aortic stenosis. No aortic insufficiency, no   vegetation. - Aorta: Normal caliber aorta with grade III-IV plaque in  descending thoracic aorta. - Mitral valve: The mitral valve is status post repair. There is a  7 mm x 2 mm mobile vegetation attached to the anterior sewing  ring. There is trivial MR and probably no significant mitral  stenosis. - Left atrium: The atrium was mildly dilated. No evidence of  thrombus in the atrial cavity or appendage. - Right ventricle: The cavity size was normal. Systolic function  was normal. - Right atrium: No evidence of thrombus in the atrial cavity or  appendage. - Tricuspid valve: No evidence of vegetation. - Pulmonic valve: No evidence of vegetation. Impressions: - Persistent mitral valve vegetation (small) attached to the  anterior sewing ring. Will send blood cultures and consult ID.  03/28/14 RHC/LHC: Coronary angiography: Coronary dominance: right Left mainstem: No significant disease.  Left anterior descending (LAD): No angiographic CAD.  Left circumflex (LCx): Large ramus. No angiographic CAD in LCx system.  Right coronary artery (RCA): No angiographic CAD.  Left ventriculography: Not done with elevated PCWP/LVEDP.  Final Conclusions: Elevated left and right heart filling pressures with preserved cardiac output. Primarily pulmonary venous hypertension but also suspect with PVR 4 that there is a component of perhaps reactive pulmonary vasoconstriction in the setting of severe MR. Prominent V-waves in PCWP tracing. No angiographic coronary disease.  Recommendations: Needs HD for volume removal prior to MV surgery.  04/02/14 Carotid U/S: Summary: Bilateral: intimal wall thickening CCA. Mild soft plaque origin ICA. 1-39% ICA stenosis. Vertebral artery flow is antegrade. ICA/CCA ratio: R-1.0 L-0.70.   03/19/15 CXR: IMPRESSION: 1. No active cardiopulmonary disease. 2. Stable pleural parenchymal scarring on the right.  05/14/17 Chest CT: IMPRESSION: 1. Interval  resolution of the multiple sub solid  pulmonary nodules seen previously. Trace linear scar is seen at the location of some of the larger nodules. No new or progressive pulmonary lesion on today's study. 2. Interval resolution of the patchy ground-glass attenuation seen bilaterally in the lungs on the prior study. 3. 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 4. Interval decrease in mediastinal lymphadenopathy. 5. Cholelithiasis. 6. Incomplete visualization of the kidneys, better assessed on abdominal CT of 04/06/2015.  He is for labs on arrival. Further evaluation by this anesthesiologist and surgeon on the day of surgery to ensure no acute issues prior to proceeding.   [facility] Short Stay Center/Anesthesiology Phone [phone] 12/26/2015 12:24 PM

## 2015-12-26 NOTE — Progress Notes (Signed)
I was unable to reach patient by phone.  I left  A message on voice mail.  I instructed the patient to arrive at [facility] entrance at 9:15 AM  , nothing to eat or drink after midnight.   I instructed the patient to take the following medications in the am with just enough water to get them down: Aspirin. I asked patient to not wear any lotions, powders, cologne, jewelry, piercing, make-up or nail polish.  I asked the patient to call [phone], in the am if there were any questions or problems.

## 2015-12-27 ENCOUNTER — Ambulatory Visit (HOSPITAL_COMMUNITY): Payer: Medicare Other | Admitting: Vascular Surgery

## 2015-12-27 ENCOUNTER — Encounter (HOSPITAL_COMMUNITY): Payer: Self-pay | Admitting: *Deleted

## 2015-12-27 ENCOUNTER — Encounter (HOSPITAL_COMMUNITY)
Admission: RE | Disposition: A | Discharge status: Home / Self Care | Payer: Self-pay | Source: Ambulatory Visit | Attending: Vascular Surgery

## 2015-12-27 ENCOUNTER — Ambulatory Visit (HOSPITAL_COMMUNITY)
Admission: RE | Admit: 2015-12-27 | Discharge: 2015-12-27 | Disposition: A | Discharge status: Home / Self Care | Payer: Medicare Other | Source: Ambulatory Visit | Attending: Vascular Surgery | Admitting: Vascular Surgery

## 2015-12-27 DIAGNOSIS — N186 End stage renal disease: Secondary | ICD-10-CM | POA: Diagnosis not present

## 2015-12-27 DIAGNOSIS — Z862 Personal history of diseases of the blood and blood-forming organs and certain disorders involving the immune mechanism: Secondary | ICD-10-CM | POA: Diagnosis not present

## 2015-12-27 DIAGNOSIS — Z7982 Long term (current) use of aspirin: Secondary | ICD-10-CM | POA: Insufficient documentation

## 2015-12-27 DIAGNOSIS — I132 Hypertensive heart and chronic kidney disease with heart failure and with stage 5 chronic kidney disease, or end stage renal disease: Secondary | ICD-10-CM | POA: Insufficient documentation

## 2015-12-27 DIAGNOSIS — I5041 Acute combined systolic (congestive) and diastolic (congestive) heart failure: Secondary | ICD-10-CM | POA: Insufficient documentation

## 2015-12-27 DIAGNOSIS — I739 Peripheral vascular disease, unspecified: Secondary | ICD-10-CM | POA: Diagnosis not present

## 2015-12-27 DIAGNOSIS — N185 Chronic kidney disease, stage 5: Secondary | ICD-10-CM | POA: Diagnosis not present

## 2015-12-27 DIAGNOSIS — Z86718 Personal history of other venous thrombosis and embolism: Secondary | ICD-10-CM | POA: Diagnosis not present

## 2015-12-27 DIAGNOSIS — Z8614 Personal history of Methicillin resistant Staphylococcus aureus infection: Secondary | ICD-10-CM | POA: Insufficient documentation

## 2015-12-27 DIAGNOSIS — I5032 Chronic diastolic (congestive) heart failure: Secondary | ICD-10-CM | POA: Diagnosis not present

## 2015-12-27 DIAGNOSIS — Z79899 Other long term (current) drug therapy: Secondary | ICD-10-CM | POA: Insufficient documentation

## 2015-12-27 HISTORY — PX: BASCILIC VEIN TRANSPOSITION: SHX5742

## 2015-12-27 LAB — POCT I-STAT 4, (NA,K, GLUC, HGB,HCT)
GLUCOSE: 85 mg/dL (ref 65–99)
HCT: 33 % — ABNORMAL LOW (ref 39.0–52.0)
HEMOGLOBIN: 11.2 g/dL — AB (ref 13.0–17.0)
Potassium: 3.7 mmol/L (ref 3.5–5.1)
Sodium: 137 mmol/L (ref 135–145)

## 2015-12-27 SURGERY — TRANSPOSITION, VEIN, BASILIC
Anesthesia: General | Site: Arm Upper | Laterality: Left

## 2015-12-27 MED ORDER — OXYCODONE-ACETAMINOPHEN 5-325 MG PO TABS
1.0000 | ORAL_TABLET | Freq: Once | ORAL | Status: AC
  Administered 2015-12-27: 1 via ORAL

## 2015-12-27 MED ORDER — LABETALOL HCL 5 MG/ML IV SOLN
10.0000 mg | Freq: Once | INTRAVENOUS | Status: AC
  Administered 2015-12-27: 10 mg via INTRAVENOUS

## 2015-12-27 MED ORDER — SODIUM CHLORIDE 0.9 % IV SOLN
INTRAVENOUS | Status: DC | PRN
  Administered 2015-12-27: 500 mL

## 2015-12-27 MED ORDER — PHENYLEPHRINE 40 MCG/ML (10ML) SYRINGE FOR IV PUSH (FOR BLOOD PRESSURE SUPPORT)
PREFILLED_SYRINGE | INTRAVENOUS | Status: AC
  Filled 2015-12-27: qty 10

## 2015-12-27 MED ORDER — ONDANSETRON HCL 4 MG/2ML IJ SOLN
INTRAMUSCULAR | Status: DC | PRN
  Administered 2015-12-27: 4 mg via INTRAVENOUS

## 2015-12-27 MED ORDER — OXYCODONE-ACETAMINOPHEN 5-325 MG PO TABS
ORAL_TABLET | ORAL | Status: AC
  Filled 2015-12-27: qty 1

## 2015-12-27 MED ORDER — SODIUM CHLORIDE 0.9 % IV SOLN
INTRAVENOUS | Status: DC
  Administered 2015-12-27: 15 mL/h via INTRAVENOUS
  Administered 2015-12-27: 13:00:00 via INTRAVENOUS

## 2015-12-27 MED ORDER — PHENYLEPHRINE HCL 10 MG/ML IJ SOLN
INTRAMUSCULAR | Status: DC | PRN
  Administered 2015-12-27 (×2): 40 ug via INTRAVENOUS
  Administered 2015-12-27 (×4): 80 ug via INTRAVENOUS

## 2015-12-27 MED ORDER — LIDOCAINE 2% (20 MG/ML) 5 ML SYRINGE
INTRAMUSCULAR | Status: AC
  Filled 2015-12-27: qty 5

## 2015-12-27 MED ORDER — OXYCODONE-ACETAMINOPHEN 5-325 MG PO TABS: 1.0000 | ORAL_TABLET | Freq: Four times a day (QID) | ORAL | Status: DC | PRN

## 2015-12-27 MED ORDER — MIDAZOLAM HCL 2 MG/2ML IJ SOLN
INTRAMUSCULAR | Status: AC
  Filled 2015-12-27: qty 2

## 2015-12-27 MED ORDER — PROPOFOL 10 MG/ML IV BOLUS
INTRAVENOUS | Status: AC
  Filled 2015-12-27: qty 20

## 2015-12-27 MED ORDER — ONDANSETRON HCL 4 MG/2ML IJ SOLN
INTRAMUSCULAR | Status: AC
  Filled 2015-12-27: qty 2

## 2015-12-27 MED ORDER — FENTANYL CITRATE (PF) 250 MCG/5ML IJ SOLN
INTRAMUSCULAR | Status: DC | PRN
  Administered 2015-12-27 (×4): 25 ug via INTRAVENOUS
  Administered 2015-12-27: 100 ug via INTRAVENOUS

## 2015-12-27 MED ORDER — MIDAZOLAM HCL 2 MG/2ML IJ SOLN
INTRAMUSCULAR | Status: DC | PRN
  Administered 2015-12-27: 2 mg via INTRAVENOUS

## 2015-12-27 MED ORDER — FENTANYL CITRATE (PF) 250 MCG/5ML IJ SOLN
INTRAMUSCULAR | Status: AC
  Filled 2015-12-27: qty 5

## 2015-12-27 MED ORDER — PROPOFOL 10 MG/ML IV BOLUS
INTRAVENOUS | Status: DC | PRN
  Administered 2015-12-27: 200 mg via INTRAVENOUS

## 2015-12-27 MED ORDER — LIDOCAINE HCL (CARDIAC) 20 MG/ML IV SOLN
INTRAVENOUS | Status: DC | PRN
  Administered 2015-12-27: 80 mg via INTRAVENOUS

## 2015-12-27 MED ORDER — CHLORHEXIDINE GLUCONATE CLOTH 2 % EX PADS: 6.0000 | MEDICATED_PAD | Freq: Once | CUTANEOUS | Status: DC

## 2015-12-27 MED ORDER — PHENYLEPHRINE HCL 10 MG/ML IJ SOLN
10.0000 mg | INTRAVENOUS | Status: DC | PRN
  Administered 2015-12-27: 20 ug/min via INTRAVENOUS

## 2015-12-27 MED ORDER — LABETALOL HCL 5 MG/ML IV SOLN
INTRAVENOUS | Status: AC
  Filled 2015-12-27: qty 4

## 2015-12-27 MED ORDER — 0.9 % SODIUM CHLORIDE (POUR BTL) OPTIME
TOPICAL | Status: DC | PRN
  Administered 2015-12-27: 1000 mL

## 2015-12-27 SURGICAL SUPPLY — 37 items
CANISTER SUCTION 2500CC (MISCELLANEOUS) ×3 IMPLANT
CLIP TI MEDIUM 24 (CLIP) ×3 IMPLANT
CLIP TI WIDE RED SMALL 24 (CLIP) ×3 IMPLANT
CORDS BIPOLAR (ELECTRODE) IMPLANT
COVER PROBE W GEL 5X96 (DRAPES) ×3 IMPLANT
DECANTER SPIKE VIAL GLASS SM (MISCELLANEOUS) ×3 IMPLANT
DRSG COVADERM 4X10 (GAUZE/BANDAGES/DRESSINGS) IMPLANT
DRSG COVADERM 4X8 (GAUZE/BANDAGES/DRESSINGS) IMPLANT
ELECT REM PT RETURN 9FT ADLT (ELECTROSURGICAL) ×3
ELECTRODE REM PT RTRN 9FT ADLT (ELECTROSURGICAL) ×1 IMPLANT
GLOVE BIO SURGEON STRL SZ 6.5 (GLOVE) ×4 IMPLANT
GLOVE BIO SURGEON STRL SZ7 (GLOVE) ×6 IMPLANT
GLOVE BIO SURGEONS STRL SZ 6.5 (GLOVE) ×2
GLOVE BIOGEL PI IND STRL 6.5 (GLOVE) ×4 IMPLANT
GLOVE BIOGEL PI IND STRL 7.5 (GLOVE) ×2 IMPLANT
GLOVE BIOGEL PI INDICATOR 6.5 (GLOVE) ×8
GLOVE BIOGEL PI INDICATOR 7.5 (GLOVE) ×4
GOWN STRL REUS W/ TWL LRG LVL3 (GOWN DISPOSABLE) ×3 IMPLANT
GOWN STRL REUS W/TWL LRG LVL3 (GOWN DISPOSABLE) ×6
KIT BASIN OR (CUSTOM PROCEDURE TRAY) ×3 IMPLANT
KIT ROOM TURNOVER OR (KITS) ×3 IMPLANT
LIQUID BAND (GAUZE/BANDAGES/DRESSINGS) ×3 IMPLANT
NS IRRIG 1000ML POUR BTL (IV SOLUTION) ×3 IMPLANT
PACK CV ACCESS (CUSTOM PROCEDURE TRAY) ×3 IMPLANT
PAD ARMBOARD 7.5X6 YLW CONV (MISCELLANEOUS) ×6 IMPLANT
SPONGE SURGIFOAM ABS GEL 100 (HEMOSTASIS) IMPLANT
STAPLER VISISTAT 35W (STAPLE) IMPLANT
SUT MNCRL AB 4-0 PS2 18 (SUTURE) ×3 IMPLANT
SUT PROLENE 6 0 BV (SUTURE) ×12 IMPLANT
SUT PROLENE 7 0 BV 1 (SUTURE) IMPLANT
SUT SILK 2 0 SH (SUTURE) ×3 IMPLANT
SUT VIC AB 2-0 CT1 27 (SUTURE) ×2
SUT VIC AB 2-0 CT1 TAPERPNT 27 (SUTURE) ×1 IMPLANT
SUT VIC AB 3-0 SH 27 (SUTURE) ×4
SUT VIC AB 3-0 SH 27X BRD (SUTURE) ×2 IMPLANT
UNDERPAD 30X30 INCONTINENT (UNDERPADS AND DIAPERS) ×3 IMPLANT
WATER STERILE IRR 1000ML POUR (IV SOLUTION) ×3 IMPLANT

## 2015-12-27 NOTE — Discharge Instructions (Signed)
° ° °  12/27/2015 Timothy Palmer [id number] 1965/08/28  Surgeon(s): Conrad Montverde, MD  Procedure(s): FIRST STAGE BASILIC VEIN TRANSPOSITION  x Do not stick graft for 12 weeks

## 2015-12-27 NOTE — H&P (View-Only) (Signed)
Vascular and Vein Specialist of [facility]  Patient name: Timothy Palmer MRN: [id number] DOB: Dec 16, 1965 Sex: male  REASON FOR VISIT: Dialysis access  HPI: CINCH RIPPLE is a 50 y.o. male who has a history of a left radiocephalic fistula placed many years ago which has thrombosed.  He comes in today for new access.  He has a right-sided catheter.  He has a history of a DVT in the right arm.  He is medically managed for hypertension.  He has been treated for endocarditis in the past with prosthetic valve  Past Medical History  Diagnosis Date  . Hypertension   . Peripheral vascular disease ([place])   . Pneumonia 2014  . ESRD (end stage renal disease) on dialysis ([place])     [place],Dialysis- T,Th,S  . Severe mitral regurgitation 03/23/2014    by TEE  . DVT of upper extremity (deep vein thrombosis) ([place]) 03/04/2014    Right arm  . Acute combined systolic and diastolic congestive heart failure ([place]) 03/21/2014  . Chronic diastolic congestive heart failure ([place])   . 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 Septic embolism to left lower extremity 03/25/2014  . 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 The aneurysm measures 1.4 x 1.1 cm which is  significantly larger than the 4.5 mm parent vessel.   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 Diabetes Mother   . Hypertension Mother   . Hypertension Father   . Hypertension Sister   . Hypertension Brother     SOCIAL HISTORY: Social History  Substance Use Topics  . Smoking status: Never Smoker   . Smokeless tobacco: Never Used  . Alcohol Use: 0.0 oz/week    0 Standard drinks or equivalent per week    No Known Allergies  Current Outpatient Prescriptions  Medication Sig Dispense Refill  . aspirin 81 MG tablet Take 81 mg by mouth daily.    . cinacalcet (SENSIPAR) 90 MG tablet Take 1 tablet (90 mg total) by mouth 2 (two) times daily.    Marland Kitchen lisinopril (PRINIVIL,ZESTRIL) 20 MG tablet Take 20 mg by mouth daily.    . multivitamin (RENA-VIT) TABS tablet Take 1 tablet by mouth at bedtime. 30 tablet 0  . Nutritional Supplements (FEEDING SUPPLEMENT, NEPRO CARB STEADY,) LIQD Take 237 mLs by mouth 2 (two) times daily between meals. (Patient taking differently: Take 237 mLs by mouth daily. )    . sevelamer carbonate (RENVELA) 800 MG tablet Take 1,600-2,400 mg by mouth 3 (three) times daily with meals.     No current facility-administered medications for this visit.    REVIEW OF SYSTEMS:  [X]  denotes positive finding, [ ]  denotes negative finding Cardiac  Comments:  Chest pain or chest pressure:    Shortness of breath upon exertion:    Short of breath when lying flat:    Irregular heart rhythm:  Vascular    Pain in calf, thigh, or hip brought on by ambulation:    Pain in feet at night that wakes you up from your sleep:     Blood clot in your veins:    Leg swelling:         Pulmonary    Oxygen at home:    Productive cough:     Wheezing:         Neurologic    Sudden weakness in arms or legs:     Sudden numbness in arms or legs:     Sudden onset of difficulty speaking or slurred speech:    Temporary loss of vision in one eye:     Problems with dizziness:         Gastrointestinal    Blood in stool:     Vomited blood:           Genitourinary    Burning when urinating:     Blood in urine:        Psychiatric    Major depression:         Hematologic    Bleeding problems:    Problems with blood clotting too easily:        Skin    Rashes or ulcers:        Constitutional    Fever or chills:      PHYSICAL EXAM: Filed Vitals:   12/18/15 0904  BP: 189/115  Pulse: 85  Temp: 97.7 F (36.5 C)  TempSrc: Oral  Resp: 18  Weight: 229 lb (103.874 kg)  SpO2: 98%    GENERAL: The patient is a well-nourished male, in no acute distress. The vital signs are documented above. CARDIAC: There is a regular rate and rhythm.  VASCULAR: Palpable brachial pulse on the left PULMONARY: There is good air exchange bilaterally without wheezing or rales. MUSCULOSKELETAL: There are no major deformities or cyanosis. NEUROLOGIC: No focal weakness or paresthesias are detected. SKIN: There are no ulcers or rashes noted. PSYCHIATRIC: The patient has a normal affect.  DATA:  I have reviewed his vein mapping.  He has an excellent left basilic vein.  The left cephalic vein is small in caliber.  The right brachial artery is ectatic measuring 1.5 cm.  MEDICAL ISSUES: This is a right-handed patient.  I have discussed proceeding with a first stage left basilic vein transposition.  He understands that this is a 2 stage procedure.  We discussed the risks and benefits of the operation including the need for second operation, the risk of arm swelling, steal syndrome, and the need for future interventions.  All of his questions were answered.  Because of scheduling limitations for me, I am putting him on for June 21 with Dr. Shelba Flake, MD Vascular and Vein Specialists of [facility] [phone] Pager [phone]

## 2015-12-27 NOTE — Transfer of Care (Signed)
Immediate Anesthesia Transfer of Care Note  Patient: Timothy Palmer  Procedure(s) Performed: Procedure(s): FIRST STAGE BASILIC VEIN TRANSPOSITION (Left)  Patient Location: PACU  Anesthesia Type:General  Level of Consciousness: awake and oriented  Airway & Oxygen Therapy: Patient Spontanous Breathing and Patient connected to nasal cannula oxygen  Post-op Assessment: Report given to RN  Post vital signs: Reviewed and stable  Last Vitals:  Filed Vitals:   12/27/15 1018 12/27/15 1350  BP: 199/123   Pulse: 88   Temp: 36.8 C 36.8 C  Resp: 18     Last Pain: There were no vitals filed for this visit.    Patients Stated Pain Goal: 6 ([id number])  Complications: No apparent anesthesia complications

## 2015-12-27 NOTE — Anesthesia Preprocedure Evaluation (Addendum)
Anesthesia Evaluation  Patient identified by MRN, date of birth, ID band Patient awake    Reviewed: Allergy & Precautions, H&P , NPO status , Patient's Chart, lab work & pertinent test results  History of Anesthesia Complications Negative for: history of anesthetic complications  Airway Mallampati: II  TM Distance: >3 FB Neck ROM: full    Dental no notable dental hx. (+) Teeth Intact, Poor Dentition, Missing, Chipped, Dental Advisory Given   Pulmonary neg pulmonary ROS,    Pulmonary exam normal breath sounds clear to auscultation       Cardiovascular hypertension, + Peripheral Vascular Disease and +CHF  Normal cardiovascular exam+ Valvular Problems/Murmurs MR  Rhythm:regular Rate:Normal     Neuro/Psych negative neurological ROS     GI/Hepatic negative GI ROS, Neg liver ROS,   Endo/Other  negative endocrine ROS  Renal/GU DialysisRenal disease     Musculoskeletal   Abdominal   Peds  Hematology  (+) anemia ,   Anesthesia Other Findings   Reproductive/Obstetrics negative OB ROS                            Anesthesia Physical Anesthesia Plan  ASA: III  Anesthesia Plan: General   Post-op Pain Management:    Induction: Intravenous  Airway Management Planned: LMA  Additional Equipment:   Intra-op Plan:   Post-operative Plan: Extubation in OR  Informed Consent: I have reviewed the patients History and Physical, chart, labs and discussed the procedure including the risks, benefits and alternatives for the proposed anesthesia with the patient or authorized representative who has indicated his/her understanding and acceptance.   Dental Advisory Given  Plan Discussed with: Anesthesiologist, CRNA and Surgeon  Anesthesia Plan Comments:         Anesthesia Quick Evaluation

## 2015-12-27 NOTE — Anesthesia Procedure Notes (Signed)
Procedure Name: LMA Insertion Date/Time: 12/27/2015 12:13 PM Performed by: Sampson Si E Pre-anesthesia Checklist: Patient identified, Emergency Drugs available, Suction available, Patient being monitored and Timeout performed Patient Re-evaluated:Patient Re-evaluated prior to inductionOxygen Delivery Method: Circle system utilized Preoxygenation: Pre-oxygenation with 100% oxygen Intubation Type: IV induction Ventilation: Mask ventilation without difficulty LMA: LMA inserted LMA Size: 4.0 Number of attempts: 1 Placement Confirmation: positive ETCO2 and breath sounds checked- equal and bilateral Tube secured with: Tape Dental Injury: Teeth and Oropharynx as per pre-operative assessment

## 2015-12-27 NOTE — Interval H&P Note (Signed)
History and Physical Interval Note:  12/27/2015 11:40 AM  Timothy Palmer  has presented today for surgery, with the diagnosis of End Stage Renal Disease N18.6  The various methods of treatment have been discussed with the patient and family. After consideration of risks, benefits and other options for treatment, the patient has consented to  Procedure(s): FIRST STAGE BASILIC VEIN TRANSPOSITION (Left) as a surgical intervention .  The patient's history has been reviewed, patient examined, no change in status, stable for surgery.  I have reviewed the patient's chart and labs.  Questions were answered to the patient's satisfaction.     Adele Barthel

## 2015-12-27 NOTE — Anesthesia Postprocedure Evaluation (Signed)
Anesthesia Post Note  Patient: Timothy Palmer  Procedure(s) Performed: Procedure(s) (LRB): FIRST STAGE BASILIC VEIN TRANSPOSITION (Left)  Patient location during evaluation: PACU Anesthesia Type: General Level of consciousness: awake and alert Pain management: pain level controlled Vital Signs Assessment: post-procedure vital signs reviewed and stable Respiratory status: spontaneous breathing, nonlabored ventilation, respiratory function stable and patient connected to nasal cannula oxygen Cardiovascular status: blood pressure returned to baseline and stable Postop Assessment: no signs of nausea or vomiting Anesthetic complications: no    Last Vitals:  Filed Vitals:   12/27/15 1420 12/27/15 1430  BP: 156/97   Pulse: 65 66  Temp:    Resp: 21 15    Last Pain:  Filed Vitals:   12/27/15 1440  PainSc: Asleep                 Zenaida Deed

## 2015-12-27 NOTE — Op Note (Signed)
OPERATIVE NOTE   PROCEDURE: 1. left first stage basilic vein transposition (brachiobasilic arteriovenous fistula) placement  PRE-OPERATIVE DIAGNOSIS: end stage renal disease   POST-OPERATIVE DIAGNOSIS: same as above   SURGEON: Adele Barthel, MD  ASSISTANT(S): Silva Bandy, PAC   ANESTHESIA: general  ESTIMATED BLOOD LOSS: 50 cc  FINDING(S): 1. Large cubital vein (4-4.5 mm) with somewhat thickened wall 2. Large brachial artery 4-5 mm with somewhat atherosclerotic wall 3. Palpable thrill and radial pulse at end of case  SPECIMEN(S):  none  INDICATIONS:   Timothy Palmer is a 50 y.o. male who presents with end stage renal disease.  The patient is scheduled for left first stage basilic vein transposition.  The patient is aware the risks include but are not limited to: bleeding, infection, steal syndrome, nerve damage, ischemic monomelic neuropathy, failure to mature, and need for additional procedures.  The patient is aware of the risks of the procedure and elects to proceed forward.  DESCRIPTION: After full informed written consent was obtained from the patient, the patient was brought back to the operating room and placed supine upon the operating table.  Prior to induction, the patient received IV antibiotics.   After obtaining adequate anesthesia, the patient was then prepped and draped in the standard fashion for a left arm access procedure.  I turned my attention first to identifying the patient's basilic vein and brachial artery.  Using SonoSite guidance, the location of these vessels were marked out on the skin.   In this process, I also found a large compressible cubital vein draining into the basilic vein.  I made a longitudinal incision incision at the level of the antecubitum, halfway between the cubital vein and brachial artery.  I dissected through the subcutaneous tissue and fascia to gain exposure of the brachial artery.  This was noted to be 4-5 mm in diameter externally.   This was dissected out proximally and distally and controlled with vessel loops .  I then dissected out the cubital vein.  This was noted to be 4-4.5 mm in diameter externally with some wall thickening.  The distal segment of the vein was ligated with a  2-0 silk, and the vein was transected.  The proximal segment was interrogated with serial dilators.  The vein accepted up to a 5 mm dilator without any difficulty.  I then instilled the heparinized saline into the vein and clamped it.  At this point, I reset my exposure of the brachial artery and placed the artery under tension proximally and distally.  I made an arteriotomy with a #11 blade, and then I extended the arteriotomy with a Potts scissor.  I injected heparinized saline proximal and distal to this arteriotomy.  The vein was then sewn to the artery in an end-to-side configuration with a running stitch of 6-0 Prolene.  Prior to completing this anastomosis, I allowed the vein and artery to backbleed.  There was no evidence of clot from any vessels.  I completed the anastomosis in the usual fashion and then released all vessel loops and clamps.  There was a palpable thrill in the venous outflow, and there was a palpable radial pulse.  At this point, I irrigated out the surgical wound.  There was no further active bleeding.  The subcutaneous tissue was reapproximated with a running stitch of 3-0 Vicryl.  The skin was then reapproximated with a running subcuticular stitch of 4-0 Vicryl.  The skin was then cleaned, dried, and reinforced with Dermabond.  The  patient tolerated this procedure well.    COMPLICATIONS: none  CONDITION: stable   Adele Barthel, MD [facility] Specialists of [place] Office: [phone] Pager: [phone]  12/27/2015, 1:35 PM

## 2015-12-28 ENCOUNTER — Encounter (HOSPITAL_COMMUNITY): Payer: Self-pay | Admitting: Vascular Surgery

## 2015-12-28 DIAGNOSIS — D509 Iron deficiency anemia, unspecified: Secondary | ICD-10-CM | POA: Diagnosis not present

## 2015-12-28 DIAGNOSIS — N186 End stage renal disease: Secondary | ICD-10-CM | POA: Diagnosis not present

## 2015-12-28 DIAGNOSIS — D631 Anemia in chronic kidney disease: Secondary | ICD-10-CM | POA: Diagnosis not present

## 2015-12-30 ENCOUNTER — Telehealth: Payer: Self-pay | Admitting: Vascular Surgery

## 2015-12-30 DIAGNOSIS — D631 Anemia in chronic kidney disease: Secondary | ICD-10-CM | POA: Diagnosis not present

## 2015-12-30 DIAGNOSIS — N186 End stage renal disease: Secondary | ICD-10-CM | POA: Diagnosis not present

## 2015-12-30 DIAGNOSIS — D509 Iron deficiency anemia, unspecified: Secondary | ICD-10-CM | POA: Diagnosis not present

## 2015-12-30 NOTE — Telephone Encounter (Signed)
Sched appt 8/4 at 9:15. Lm on hm# to inform pt.

## 2015-12-30 NOTE — Telephone Encounter (Signed)
-----   Message from Mena Goes, RN sent at 12/27/2015  1:51 PM EDT ----- Regarding: schedule   ----- Message -----    From: Alvia Grove, PA-C    Sent: 12/27/2015   1:44 PM      To: Vvs Charge Pool  S/p left 1st stage BVT 12/27/15  F/u with Dr. Bridgett Larsson in 6 weeks. No studies.  Thanks Maudie Mercury

## 2016-01-02 DIAGNOSIS — N186 End stage renal disease: Secondary | ICD-10-CM | POA: Diagnosis not present

## 2016-01-02 DIAGNOSIS — D631 Anemia in chronic kidney disease: Secondary | ICD-10-CM | POA: Diagnosis not present

## 2016-01-02 DIAGNOSIS — D509 Iron deficiency anemia, unspecified: Secondary | ICD-10-CM | POA: Diagnosis not present

## 2016-01-04 DIAGNOSIS — D509 Iron deficiency anemia, unspecified: Secondary | ICD-10-CM | POA: Diagnosis not present

## 2016-01-04 DIAGNOSIS — Z7901 Long term (current) use of anticoagulants: Secondary | ICD-10-CM | POA: Diagnosis not present

## 2016-01-04 DIAGNOSIS — N186 End stage renal disease: Secondary | ICD-10-CM | POA: Diagnosis not present

## 2016-01-04 DIAGNOSIS — I481 Persistent atrial fibrillation: Secondary | ICD-10-CM | POA: Diagnosis not present

## 2016-01-04 DIAGNOSIS — Z5181 Encounter for therapeutic drug level monitoring: Secondary | ICD-10-CM | POA: Diagnosis not present

## 2016-01-04 DIAGNOSIS — D631 Anemia in chronic kidney disease: Secondary | ICD-10-CM | POA: Diagnosis not present

## 2016-01-05 DIAGNOSIS — N186 End stage renal disease: Secondary | ICD-10-CM | POA: Diagnosis not present

## 2016-01-05 DIAGNOSIS — I120 Hypertensive chronic kidney disease with stage 5 chronic kidney disease or end stage renal disease: Secondary | ICD-10-CM | POA: Diagnosis not present

## 2016-01-05 DIAGNOSIS — Z992 Dependence on renal dialysis: Secondary | ICD-10-CM | POA: Diagnosis not present

## 2016-01-06 DIAGNOSIS — N186 End stage renal disease: Secondary | ICD-10-CM | POA: Diagnosis not present

## 2016-01-06 DIAGNOSIS — D631 Anemia in chronic kidney disease: Secondary | ICD-10-CM | POA: Diagnosis not present

## 2016-01-06 DIAGNOSIS — D509 Iron deficiency anemia, unspecified: Secondary | ICD-10-CM | POA: Diagnosis not present

## 2016-01-09 DIAGNOSIS — N186 End stage renal disease: Secondary | ICD-10-CM | POA: Diagnosis not present

## 2016-01-09 DIAGNOSIS — D631 Anemia in chronic kidney disease: Secondary | ICD-10-CM | POA: Diagnosis not present

## 2016-01-09 DIAGNOSIS — D509 Iron deficiency anemia, unspecified: Secondary | ICD-10-CM | POA: Diagnosis not present

## 2016-01-11 DIAGNOSIS — D631 Anemia in chronic kidney disease: Secondary | ICD-10-CM | POA: Diagnosis not present

## 2016-01-11 DIAGNOSIS — D509 Iron deficiency anemia, unspecified: Secondary | ICD-10-CM | POA: Diagnosis not present

## 2016-01-11 DIAGNOSIS — N186 End stage renal disease: Secondary | ICD-10-CM | POA: Diagnosis not present

## 2016-01-13 DIAGNOSIS — D631 Anemia in chronic kidney disease: Secondary | ICD-10-CM | POA: Diagnosis not present

## 2016-01-13 DIAGNOSIS — N186 End stage renal disease: Secondary | ICD-10-CM | POA: Diagnosis not present

## 2016-01-13 DIAGNOSIS — D509 Iron deficiency anemia, unspecified: Secondary | ICD-10-CM | POA: Diagnosis not present

## 2016-01-16 DIAGNOSIS — D509 Iron deficiency anemia, unspecified: Secondary | ICD-10-CM | POA: Diagnosis not present

## 2016-01-16 DIAGNOSIS — N186 End stage renal disease: Secondary | ICD-10-CM | POA: Diagnosis not present

## 2016-01-16 DIAGNOSIS — D631 Anemia in chronic kidney disease: Secondary | ICD-10-CM | POA: Diagnosis not present

## 2016-01-18 DIAGNOSIS — N186 End stage renal disease: Secondary | ICD-10-CM | POA: Diagnosis not present

## 2016-01-18 DIAGNOSIS — D509 Iron deficiency anemia, unspecified: Secondary | ICD-10-CM | POA: Diagnosis not present

## 2016-01-18 DIAGNOSIS — D631 Anemia in chronic kidney disease: Secondary | ICD-10-CM | POA: Diagnosis not present

## 2016-01-20 DIAGNOSIS — D509 Iron deficiency anemia, unspecified: Secondary | ICD-10-CM | POA: Diagnosis not present

## 2016-01-20 DIAGNOSIS — N186 End stage renal disease: Secondary | ICD-10-CM | POA: Diagnosis not present

## 2016-01-20 DIAGNOSIS — D631 Anemia in chronic kidney disease: Secondary | ICD-10-CM | POA: Diagnosis not present

## 2016-01-23 DIAGNOSIS — D509 Iron deficiency anemia, unspecified: Secondary | ICD-10-CM | POA: Diagnosis not present

## 2016-01-23 DIAGNOSIS — D631 Anemia in chronic kidney disease: Secondary | ICD-10-CM | POA: Diagnosis not present

## 2016-01-23 DIAGNOSIS — N186 End stage renal disease: Secondary | ICD-10-CM | POA: Diagnosis not present

## 2016-01-25 DIAGNOSIS — D509 Iron deficiency anemia, unspecified: Secondary | ICD-10-CM | POA: Diagnosis not present

## 2016-01-25 DIAGNOSIS — N186 End stage renal disease: Secondary | ICD-10-CM | POA: Diagnosis not present

## 2016-01-25 DIAGNOSIS — D631 Anemia in chronic kidney disease: Secondary | ICD-10-CM | POA: Diagnosis not present

## 2016-01-27 DIAGNOSIS — D509 Iron deficiency anemia, unspecified: Secondary | ICD-10-CM | POA: Diagnosis not present

## 2016-01-27 DIAGNOSIS — D631 Anemia in chronic kidney disease: Secondary | ICD-10-CM | POA: Diagnosis not present

## 2016-01-27 DIAGNOSIS — N186 End stage renal disease: Secondary | ICD-10-CM | POA: Diagnosis not present

## 2016-01-30 DIAGNOSIS — D509 Iron deficiency anemia, unspecified: Secondary | ICD-10-CM | POA: Diagnosis not present

## 2016-01-30 DIAGNOSIS — D631 Anemia in chronic kidney disease: Secondary | ICD-10-CM | POA: Diagnosis not present

## 2016-01-30 DIAGNOSIS — N186 End stage renal disease: Secondary | ICD-10-CM | POA: Diagnosis not present

## 2016-02-01 DIAGNOSIS — D509 Iron deficiency anemia, unspecified: Secondary | ICD-10-CM | POA: Diagnosis not present

## 2016-02-01 DIAGNOSIS — E1129 Type 2 diabetes mellitus with other diabetic kidney complication: Secondary | ICD-10-CM | POA: Diagnosis not present

## 2016-02-01 DIAGNOSIS — Z5181 Encounter for therapeutic drug level monitoring: Secondary | ICD-10-CM | POA: Diagnosis not present

## 2016-02-01 DIAGNOSIS — I481 Persistent atrial fibrillation: Secondary | ICD-10-CM | POA: Diagnosis not present

## 2016-02-01 DIAGNOSIS — N186 End stage renal disease: Secondary | ICD-10-CM | POA: Diagnosis not present

## 2016-02-01 DIAGNOSIS — D631 Anemia in chronic kidney disease: Secondary | ICD-10-CM | POA: Diagnosis not present

## 2016-02-01 DIAGNOSIS — Z7901 Long term (current) use of anticoagulants: Secondary | ICD-10-CM | POA: Diagnosis not present

## 2016-02-02 ENCOUNTER — Encounter: Payer: Self-pay | Admitting: Vascular Surgery

## 2016-02-03 DIAGNOSIS — D631 Anemia in chronic kidney disease: Secondary | ICD-10-CM | POA: Diagnosis not present

## 2016-02-03 DIAGNOSIS — N186 End stage renal disease: Secondary | ICD-10-CM | POA: Diagnosis not present

## 2016-02-03 DIAGNOSIS — D509 Iron deficiency anemia, unspecified: Secondary | ICD-10-CM | POA: Diagnosis not present

## 2016-02-05 ENCOUNTER — Ambulatory Visit: Payer: Self-pay | Admitting: Surgery

## 2016-02-05 DIAGNOSIS — N186 End stage renal disease: Secondary | ICD-10-CM | POA: Diagnosis not present

## 2016-02-05 DIAGNOSIS — Z992 Dependence on renal dialysis: Secondary | ICD-10-CM | POA: Diagnosis not present

## 2016-02-05 DIAGNOSIS — N2581 Secondary hyperparathyroidism of renal origin: Secondary | ICD-10-CM | POA: Diagnosis not present

## 2016-02-05 DIAGNOSIS — I120 Hypertensive chronic kidney disease with stage 5 chronic kidney disease or end stage renal disease: Secondary | ICD-10-CM | POA: Diagnosis not present

## 2016-02-06 DIAGNOSIS — D509 Iron deficiency anemia, unspecified: Secondary | ICD-10-CM | POA: Diagnosis not present

## 2016-02-06 DIAGNOSIS — N186 End stage renal disease: Secondary | ICD-10-CM | POA: Diagnosis not present

## 2016-02-06 DIAGNOSIS — D631 Anemia in chronic kidney disease: Secondary | ICD-10-CM | POA: Diagnosis not present

## 2016-02-06 NOTE — Progress Notes (Signed)
Postoperative Access Visit   History of Present Illness  Timothy Palmer is a 50 y.o. year old male who presents for postoperative follow-up for: L 1st BVT (Date: 12/27/15).  The patient's wounds are healed.  The patient notes no steal symptoms.  The patient is able to complete their activities of daily living.  The patient's current symptoms are: aching in L hand with HD.  Past Medical History:  Diagnosis Date  . Acute combined systolic and diastolic congestive heart failure ([place]) 03/21/2014  . 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 Chronic diastolic congestive heart failure ([place])   . DVT of upper extremity (deep vein thrombosis) ([place]) 03/04/2014   Right arm  . ESRD (end stage renal disease) on dialysis ([place])    [place],Dialysis- T,Th,S  . Hypertension   . 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 The aneurysm measures 1.4 x 1.1 cm which is  significantly larger than the 4.5 mm parent vessel.   . Peripheral vascular disease ([place])   . Pneumonia 2014  . 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 Septic embolism to left lower extremity 03/25/2014  . Severe mitral regurgitation 03/23/2014   by TEE  . 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 AV FISTULA PLACEMENT Left ?2005   forearm   . BASCILIC VEIN TRANSPOSITION Left 12/27/2015   Procedure: FIRST STAGE BASILIC VEIN  TRANSPOSITION;  Surgeon: Conrad Hatfield, MD;  Location: [place];  Service: Vascular;  Laterality: Left;  . EMBOLECTOMY Left 03/25/2014   Procedure: EMBOLECTOMY left popliteal;  Surgeon: Rosetta Posner, MD;  Location: [place];  Service: Vascular;  Laterality: Left;  . FEMORAL-POPLITEAL BYPASS GRAFT Left 03/25/2014   Procedure: Left Femoral- Below Knee Popliteal Bypass Graft;  Surgeon: Rosetta Posner, MD;  Location: [place];  Service: Vascular;  Laterality: Left;  . INTRAOPERATIVE TRANSESOPHAGEAL ECHOCARDIOGRAM N/A 04/05/2014   Procedure: INTRAOPERATIVE TRANSESOPHAGEAL ECHOCARDIOGRAM;  Surgeon: Rexene Alberts, MD;  Location: [place];  Service: Open Heart Surgery;  Laterality: N/A;  . LEFT AND RIGHT HEART CATHETERIZATION WITH CORONARY ANGIOGRAM N/A 03/28/2014   Procedure: LEFT AND RIGHT HEART CATHETERIZATION WITH CORONARY ANGIOGRAM;  Surgeon: Larey Dresser, MD;  Location: [facility] CATH LAB;  Service: Cardiovascular;  Laterality: N/A;  . MITRAL VALVE REPAIR Right 04/05/2014   Procedure: MINIMALLY INVASIVE MITRAL VALVE REPAIR (MVR);  Surgeon: Rexene Alberts, MD;  Location: [place];  Service: Open Heart Surgery;  Laterality: Right;  . MITRAL VALVE REPLACEMENT Right 04/05/2014   Procedure: Bring back MINIMALLY INVASIVE MITRAL VALVE (MV) REPLACEMENT - Reexploration for bleeding;  Surgeon: Rexene Alberts, MD;  Location: [place];  Service: Open Heart Surgery;  Laterality: Right;  . PATCH ANGIOPLASTY Left 07/23/2013   Procedure: PATCH ANGIOPLASTY- LEFT RADIOCEPHALIC ARTERIOVENOUS FISTULA;  Surgeon: Angelia Mould, MD;  Location: [place];  Service: Vascular;  Laterality: Left;  . SHUNTOGRAM Left November 04, 2011  . TEE WITHOUT CARDIOVERSION N/A  03/24/2014   Procedure: TRANSESOPHAGEAL ECHOCARDIOGRAM (TEE);  Surgeon: Larey Dresser, MD;  Location: [place];  Service: Cardiovascular;  Laterality: N/A;  . TEE WITHOUT CARDIOVERSION N/A 04/12/2015   Procedure: TRANSESOPHAGEAL ECHOCARDIOGRAM (TEE);  Surgeon: Larey Dresser, MD;   Location: [place];  Service: Cardiovascular;  Laterality: N/A;  . TEE WITHOUT CARDIOVERSION N/A 08/18/2015   Procedure: TRANSESOPHAGEAL ECHOCARDIOGRAM (TEE);  Surgeon: Larey Dresser, MD;  Location[facility] ENDOSCOPY;  Service: Cardiovascular;  Laterality: N/A;    Social History   Social History  . Marital status: Single    Spouse name: N/A  . Number of children: N/A  . Years of education: N/A   Occupational History  . Not on file.   Social History Main Topics  . Smoking status: Never Smoker  . Smokeless tobacco: Never Used  . Alcohol use 0.0 oz/week  . Drug use:     Frequency: 7.0 times per week    Types: Marijuana     Comment: last use 2.9.17 @ 1900  . Sexual activity: No   Other Topics Concern  . Not on file   Social History Narrative  . No narrative on file    Family History  Problem Relation Age of Onset  . Diabetes Mother   . Hypertension Mother   . Hypertension Father   . Hypertension Sister   . Hypertension Brother     Current Outpatient Prescriptions  Medication Sig Dispense Refill  . aspirin 81 MG tablet Take 81 mg by mouth daily.    . cinacalcet (SENSIPAR) 90 MG tablet Take 1 tablet (90 mg total) by mouth 2 (two) times daily.    Marland Kitchen ibuprofen (ADVIL,MOTRIN) 200 MG tablet Take 200 mg by mouth every 6 (six) hours as needed.    Marland Kitchen lisinopril (PRINIVIL,ZESTRIL) 20 MG tablet Take 20 mg by mouth daily.    . multivitamin (RENA-VIT) TABS tablet Take 1 tablet by mouth at bedtime. 30 tablet 0  . Nutritional Supplements (FEEDING SUPPLEMENT, NEPRO CARB STEADY,) LIQD Take 237 mLs by mouth 2 (two) times daily between meals. (Patient taking differently: Take 237 mLs by mouth daily. )    . sevelamer carbonate (RENVELA) 800 MG tablet Take 1,600-2,400 mg by mouth 3 (three) times daily with meals.    Marland Kitchen oxyCODONE-acetaminophen (ROXICET) 5-325 MG tablet Take 1 tablet by mouth every 6 (six) hours as needed. (Patient not taking: Reported on 02/09/2016) 6 tablet 0   No current  facility-administered medications for this visit.      No Known Allergies   REVIEW OF SYSTEMS:  (Positives checked otherwise negative)  CARDIOVASCULAR:   [ ]  chest pain,  [ ]  chest pressure,  [ ]  palpitations,  [ ]  shortness of breath when laying flat,  [ ]  shortness of breath with exertion,   [ ]  pain in feet when walking,  [ ]  pain in feet when laying flat, [ ]  history of blood clot in veins (DVT),  [ ]  history of phlebitis,  [ ]  swelling in legs,  [ ]  varicose veins  PULMONARY:   [ ]  productive cough,  [ ]  asthma,  [ ]  wheezing  NEUROLOGIC:   [ ]  weakness in arms or legs,  [ ]  numbness in arms or legs,  [ ]  difficulty speaking or slurred speech,  [ ]  temporary loss of vision in one eye,  [ ]  dizziness  HEMATOLOGIC:   [ ]  bleeding problems,  [ ]  problems with blood clotting too easily  MUSCULOSKEL:   [ ]   joint pain, [ ]  joint swelling  GASTROINTEST:   [ ]  vomiting blood,  [ ]  blood in stool     GENITOURINARY:   [ ]  burning with urination,  [ ]  blood in urine [x]  ESRD-HD: T/R/S  PSYCHIATRIC:   [ ]  history of major depression  INTEGUMENTARY:   [ ]  rashes,  [ ]  ulcers  CONSTITUTIONAL:   [ ]  fever,  [ ]  chills   For VQI Use Only  PRE-ADM LIVING: Home  AMB STATUS: Ambulatory  Physical Examination Vitals:   02/09/16 0938 02/09/16 0940  BP: (!) 168/102 (!) 169/101  Pulse: 88    Pulmonary: Sym exp, good air movt, CTAB, no rales, rhonchi, & wheezing  Cardiac: RRR, Nl S1, S2, no Murmurs, rubs or gallops  LUE: Incision is healed, skin feels warm, hand grip is 5/5, sensation in digits is intact, palpable thrill, bruit can be auscultated, palpable radial pulse, On Sonosite: fistula >6 mm throughout  Medical Decision Making  Timothy Palmer is a 50 y.o. year old male who presents s/p L 1st BVT .  The patient's access is ready for transposition. Risk, benefits, and alternatives to access surgery were discussed.   The patient is aware the risks  include but are not limited to: bleeding, infection, steal syndrome, nerve damage, ischemic monomelic neuropathy, failure to mature, need for additional procedures, death and stroke.  The patient agrees to proceed forward with the procedure.  Will schedule patient of L 2nd stage BVT on 7 AUG 17  Thank you for allowing [place] to participate in this patient's care.  Adele Barthel, MD, FACS [facility] [place] Office: [phone] Pager: [phone]

## 2016-02-08 DIAGNOSIS — N186 End stage renal disease: Secondary | ICD-10-CM | POA: Diagnosis not present

## 2016-02-08 DIAGNOSIS — D509 Iron deficiency anemia, unspecified: Secondary | ICD-10-CM | POA: Diagnosis not present

## 2016-02-08 DIAGNOSIS — D631 Anemia in chronic kidney disease: Secondary | ICD-10-CM | POA: Diagnosis not present

## 2016-02-09 ENCOUNTER — Ambulatory Visit (INDEPENDENT_AMBULATORY_CARE_PROVIDER_SITE_OTHER): Payer: Medicare Other | Admitting: Vascular Surgery

## 2016-02-09 ENCOUNTER — Encounter (HOSPITAL_COMMUNITY): Payer: Self-pay | Admitting: *Deleted

## 2016-02-09 ENCOUNTER — Other Ambulatory Visit: Payer: Self-pay

## 2016-02-09 ENCOUNTER — Encounter: Payer: Self-pay | Admitting: Vascular Surgery

## 2016-02-09 VITALS — BP 169/101 | HR 88 | Ht 71.0 in | Wt 224.6 lb

## 2016-02-09 DIAGNOSIS — N186 End stage renal disease: Secondary | ICD-10-CM

## 2016-02-09 DIAGNOSIS — Z992 Dependence on renal dialysis: Secondary | ICD-10-CM

## 2016-02-09 NOTE — Progress Notes (Signed)
Pt has hx of CHF, Mitral valve repair. Denies any chest pain. States she does have sob with exertion at times. Cardiologist is Dr. Aundra Dubin.

## 2016-02-10 DIAGNOSIS — D631 Anemia in chronic kidney disease: Secondary | ICD-10-CM | POA: Diagnosis not present

## 2016-02-10 DIAGNOSIS — N186 End stage renal disease: Secondary | ICD-10-CM | POA: Diagnosis not present

## 2016-02-10 DIAGNOSIS — D509 Iron deficiency anemia, unspecified: Secondary | ICD-10-CM | POA: Diagnosis not present

## 2016-02-11 NOTE — Anesthesia Preprocedure Evaluation (Addendum)
Anesthesia Evaluation  Patient identified by MRN, date of birth, ID band Patient awake    Reviewed: Allergy & Precautions, NPO status , Patient's Chart, lab work & pertinent test results  History of Anesthesia Complications Negative for: history of anesthetic complications  Airway Mallampati: II  TM Distance: >3 FB Neck ROM: Full    Dental  (+) Dental Advisory Given, Poor Dentition, Chipped, Missing   Pulmonary shortness of breath and with exertion,    Pulmonary exam normal breath sounds clear to auscultation       Cardiovascular Exercise Tolerance: Poor hypertension, Pt. on medications + Peripheral Vascular Disease, +CHF and + DVT  + Valvular Problems/Murmurs (s/p Mini MVR) MR  Rhythm:Regular Rate:Normal + Systolic murmurs    Neuro/Psych negative neurological ROS  negative psych ROS   GI/Hepatic negative GI ROS, Neg liver ROS,   Endo/Other  Obesity   Renal/GU ESRF and DialysisRenal disease (T/Th/Sat)     Musculoskeletal negative musculoskeletal ROS (+)   Abdominal   Peds  Hematology  (+) Blood dyscrasia, anemia ,   Anesthesia Other Findings Day of surgery medications reviewed with the patient.  Reproductive/Obstetrics                            Anesthesia Physical Anesthesia Plan  ASA: IV  Anesthesia Plan: General   Post-op Pain Management:    Induction: Intravenous  Airway Management Planned: LMA  Additional Equipment:   Intra-op Plan:   Post-operative Plan: Extubation in OR  Informed Consent: I have reviewed the patients History and Physical, chart, labs and discussed the procedure including the risks, benefits and alternatives for the proposed anesthesia with the patient or authorized representative who has indicated his/her understanding and acceptance.   Dental advisory given  Plan Discussed with: CRNA  Anesthesia Plan Comments: (Risks/benefits of general  anesthesia discussed with patient including risk of damage to teeth, lips, gum, and tongue, nausea/vomiting, allergic reactions to medications, and the possibility of heart attack, stroke and death.  All patient questions answered.  Patient wishes to proceed.)        Anesthesia Quick Evaluation

## 2016-02-12 ENCOUNTER — Encounter (HOSPITAL_COMMUNITY): Payer: Self-pay | Admitting: *Deleted

## 2016-02-12 ENCOUNTER — Ambulatory Visit (HOSPITAL_COMMUNITY): Payer: Medicare Other | Admitting: Anesthesiology

## 2016-02-12 ENCOUNTER — Encounter (HOSPITAL_COMMUNITY)
Admission: RE | Disposition: A | Discharge status: Home / Self Care | Payer: Self-pay | Source: Ambulatory Visit | Attending: Vascular Surgery

## 2016-02-12 ENCOUNTER — Ambulatory Visit (HOSPITAL_COMMUNITY)
Admission: RE | Admit: 2016-02-12 | Discharge: 2016-02-12 | Disposition: A | Discharge status: Home / Self Care | Payer: Medicare Other | Source: Ambulatory Visit | Attending: Vascular Surgery | Admitting: Vascular Surgery

## 2016-02-12 DIAGNOSIS — E669 Obesity, unspecified: Secondary | ICD-10-CM | POA: Insufficient documentation

## 2016-02-12 DIAGNOSIS — Z86718 Personal history of other venous thrombosis and embolism: Secondary | ICD-10-CM | POA: Insufficient documentation

## 2016-02-12 DIAGNOSIS — Z952 Presence of prosthetic heart valve: Secondary | ICD-10-CM | POA: Insufficient documentation

## 2016-02-12 DIAGNOSIS — I5032 Chronic diastolic (congestive) heart failure: Secondary | ICD-10-CM | POA: Diagnosis not present

## 2016-02-12 DIAGNOSIS — N185 Chronic kidney disease, stage 5: Secondary | ICD-10-CM | POA: Diagnosis not present

## 2016-02-12 DIAGNOSIS — I132 Hypertensive heart and chronic kidney disease with heart failure and with stage 5 chronic kidney disease, or end stage renal disease: Secondary | ICD-10-CM | POA: Diagnosis not present

## 2016-02-12 DIAGNOSIS — N186 End stage renal disease: Secondary | ICD-10-CM | POA: Diagnosis not present

## 2016-02-12 DIAGNOSIS — Z6831 Body mass index (BMI) 31.0-31.9, adult: Secondary | ICD-10-CM | POA: Diagnosis not present

## 2016-02-12 DIAGNOSIS — Z7982 Long term (current) use of aspirin: Secondary | ICD-10-CM | POA: Insufficient documentation

## 2016-02-12 DIAGNOSIS — I5042 Chronic combined systolic (congestive) and diastolic (congestive) heart failure: Secondary | ICD-10-CM | POA: Diagnosis not present

## 2016-02-12 DIAGNOSIS — Z992 Dependence on renal dialysis: Secondary | ICD-10-CM | POA: Diagnosis not present

## 2016-02-12 DIAGNOSIS — Z95820 Peripheral vascular angioplasty status with implants and grafts: Secondary | ICD-10-CM | POA: Insufficient documentation

## 2016-02-12 DIAGNOSIS — Z79899 Other long term (current) drug therapy: Secondary | ICD-10-CM | POA: Diagnosis not present

## 2016-02-12 HISTORY — PX: BASCILIC VEIN TRANSPOSITION: SHX5742

## 2016-02-12 LAB — POCT I-STAT 4, (NA,K, GLUC, HGB,HCT)
GLUCOSE: 86 mg/dL (ref 65–99)
HCT: 35 % — ABNORMAL LOW (ref 39.0–52.0)
Hemoglobin: 11.9 g/dL — ABNORMAL LOW (ref 13.0–17.0)
Potassium: 4.5 mmol/L (ref 3.5–5.1)
Sodium: 140 mmol/L (ref 135–145)

## 2016-02-12 SURGERY — TRANSPOSITION, VEIN, BASILIC
Anesthesia: General | Site: Arm Upper | Laterality: Left

## 2016-02-12 MED ORDER — SODIUM CHLORIDE 0.9 % IV SOLN
INTRAVENOUS | Status: DC
  Administered 2016-02-12: 07:00:00 via INTRAVENOUS

## 2016-02-12 MED ORDER — PROPOFOL 10 MG/ML IV BOLUS
INTRAVENOUS | Status: AC
  Filled 2016-02-12: qty 20

## 2016-02-12 MED ORDER — CHLORHEXIDINE GLUCONATE CLOTH 2 % EX PADS: 6.0000 | MEDICATED_PAD | Freq: Once | CUTANEOUS | Status: DC

## 2016-02-12 MED ORDER — OXYCODONE-ACETAMINOPHEN 5-325 MG PO TABS
1.0000 | ORAL_TABLET | Freq: Once | ORAL | Status: AC
  Administered 2016-02-12: 1 via ORAL

## 2016-02-12 MED ORDER — FENTANYL CITRATE (PF) 100 MCG/2ML IJ SOLN
INTRAMUSCULAR | Status: DC | PRN
  Administered 2016-02-12: 50 ug via INTRAVENOUS
  Administered 2016-02-12: 25 ug via INTRAVENOUS
  Administered 2016-02-12 (×2): 50 ug via INTRAVENOUS
  Administered 2016-02-12: 25 ug via INTRAVENOUS
  Administered 2016-02-12: 50 ug via INTRAVENOUS

## 2016-02-12 MED ORDER — FENTANYL CITRATE (PF) 250 MCG/5ML IJ SOLN
INTRAMUSCULAR | Status: AC
  Filled 2016-02-12: qty 5

## 2016-02-12 MED ORDER — DEXTROSE 5 % IV SOLN
1.5000 g | INTRAVENOUS | Status: AC
  Administered 2016-02-12: 1.5 g via INTRAVENOUS
  Filled 2016-02-12: qty 1.5

## 2016-02-12 MED ORDER — 0.9 % SODIUM CHLORIDE (POUR BTL) OPTIME
TOPICAL | Status: DC | PRN
  Administered 2016-02-12: 1000 mL

## 2016-02-12 MED ORDER — MIDAZOLAM HCL 5 MG/5ML IJ SOLN
INTRAMUSCULAR | Status: DC | PRN
  Administered 2016-02-12: 2 mg via INTRAVENOUS

## 2016-02-12 MED ORDER — LIDOCAINE HCL (CARDIAC) 20 MG/ML IV SOLN
INTRAVENOUS | Status: DC | PRN
  Administered 2016-02-12: 100 mg via INTRAVENOUS

## 2016-02-12 MED ORDER — OXYCODONE-ACETAMINOPHEN 5-325 MG PO TABS: 1.0000 | ORAL_TABLET | Freq: Four times a day (QID) | ORAL | 0 refills | Status: DC | PRN

## 2016-02-12 MED ORDER — MIDAZOLAM HCL 2 MG/2ML IJ SOLN
INTRAMUSCULAR | Status: AC
  Filled 2016-02-12: qty 2

## 2016-02-12 MED ORDER — PHENYLEPHRINE HCL 10 MG/ML IJ SOLN
INTRAVENOUS | Status: DC | PRN
  Administered 2016-02-12: 40 ug/min via INTRAVENOUS

## 2016-02-12 MED ORDER — HEMOSTATIC AGENTS (NO CHARGE) OPTIME
TOPICAL | Status: DC | PRN
  Administered 2016-02-12: 1 via TOPICAL

## 2016-02-12 MED ORDER — OXYCODONE-ACETAMINOPHEN 5-325 MG PO TABS
ORAL_TABLET | ORAL | Status: AC
  Filled 2016-02-12: qty 1

## 2016-02-12 MED ORDER — PROPOFOL 10 MG/ML IV BOLUS
INTRAVENOUS | Status: DC | PRN
  Administered 2016-02-12: 200 mg via INTRAVENOUS

## 2016-02-12 MED ORDER — SODIUM CHLORIDE 0.9 % IV SOLN
INTRAVENOUS | Status: DC | PRN
  Administered 2016-02-12: 07:00:00

## 2016-02-12 MED ORDER — PHENYLEPHRINE HCL 10 MG/ML IJ SOLN
INTRAMUSCULAR | Status: DC | PRN
  Administered 2016-02-12: 40 ug via INTRAVENOUS
  Administered 2016-02-12 (×2): 80 ug via INTRAVENOUS

## 2016-02-12 MED ORDER — LIDOCAINE HCL (PF) 1 % IJ SOLN
INTRAMUSCULAR | Status: AC
  Filled 2016-02-12: qty 30

## 2016-02-12 SURGICAL SUPPLY — 38 items
ARMBAND PINK RESTRICT EXTREMIT (MISCELLANEOUS) ×3 IMPLANT
BNDG GAUZE ELAST 4 BULKY (GAUZE/BANDAGES/DRESSINGS) ×3 IMPLANT
CANISTER SUCTION 2500CC (MISCELLANEOUS) ×3 IMPLANT
CLIP TI MEDIUM 24 (CLIP) ×3 IMPLANT
CLIP TI WIDE RED SMALL 24 (CLIP) ×3 IMPLANT
CORDS BIPOLAR (ELECTRODE) IMPLANT
COVER PROBE W GEL 5X96 (DRAPES) IMPLANT
DECANTER SPIKE VIAL GLASS SM (MISCELLANEOUS) ×3 IMPLANT
ELECT REM PT RETURN 9FT ADLT (ELECTROSURGICAL) ×3
ELECTRODE REM PT RTRN 9FT ADLT (ELECTROSURGICAL) ×1 IMPLANT
GAUZE SPONGE 4X4 16PLY XRAY LF (GAUZE/BANDAGES/DRESSINGS) ×3 IMPLANT
GLOVE BIO SURGEON STRL SZ7 (GLOVE) ×3 IMPLANT
GLOVE BIOGEL PI IND STRL 7.5 (GLOVE) ×1 IMPLANT
GLOVE BIOGEL PI INDICATOR 7.5 (GLOVE) ×2
GOWN STRL REUS W/ TWL LRG LVL3 (GOWN DISPOSABLE) ×3 IMPLANT
GOWN STRL REUS W/TWL LRG LVL3 (GOWN DISPOSABLE) ×6
HEMOSTAT SPONGE AVITENE ULTRA (HEMOSTASIS) ×6 IMPLANT
KIT BASIN OR (CUSTOM PROCEDURE TRAY) ×3 IMPLANT
KIT ROOM TURNOVER OR (KITS) ×3 IMPLANT
LIQUID BAND (GAUZE/BANDAGES/DRESSINGS) ×3 IMPLANT
NS IRRIG 1000ML POUR BTL (IV SOLUTION) ×3 IMPLANT
PACK CV ACCESS (CUSTOM PROCEDURE TRAY) ×3 IMPLANT
PAD ARMBOARD 7.5X6 YLW CONV (MISCELLANEOUS) ×6 IMPLANT
SPONGE GAUZE 4X4 12PLY STER LF (GAUZE/BANDAGES/DRESSINGS) ×3 IMPLANT
STAPLER VISISTAT 35W (STAPLE) ×6 IMPLANT
SUT MNCRL AB 4-0 PS2 18 (SUTURE) ×3 IMPLANT
SUT PROLENE 6 0 BV (SUTURE) ×3 IMPLANT
SUT PROLENE 7 0 BV 1 (SUTURE) IMPLANT
SUT SILK 2 0 SH (SUTURE) ×3 IMPLANT
SUT VIC AB 2-0 CT1 27 (SUTURE) ×2
SUT VIC AB 2-0 CT1 TAPERPNT 27 (SUTURE) ×1 IMPLANT
SUT VIC AB 3-0 SH 27 (SUTURE) ×6
SUT VIC AB 3-0 SH 27X BRD (SUTURE) ×3 IMPLANT
TAPE CLOTH SURG 4X10 WHT LF (GAUZE/BANDAGES/DRESSINGS) ×3 IMPLANT
TUBE CONNECTING 20'X1/4 (TUBING) ×1
TUBE CONNECTING 20X1/4 (TUBING) ×2 IMPLANT
UNDERPAD 30X30 INCONTINENT (UNDERPADS AND DIAPERS) ×3 IMPLANT
WATER STERILE IRR 1000ML POUR (IV SOLUTION) ×3 IMPLANT

## 2016-02-12 NOTE — Anesthesia Procedure Notes (Signed)
Procedure Name: LMA Insertion Date/Time: 02/12/2016 7:31 AM Performed by: Shirlyn Goltz Pre-anesthesia Checklist: Patient identified, Emergency Drugs available, Suction available and Patient being monitored Patient Re-evaluated:Patient Re-evaluated prior to inductionOxygen Delivery Method: Circle system utilized Preoxygenation: Pre-oxygenation with 100% oxygen Intubation Type: IV induction LMA: LMA inserted LMA Size: 5.0 Placement Confirmation: ETT inserted through vocal cords under direct vision,  positive ETCO2 and breath sounds checked- equal and bilateral Tube secured with: Tape Dental Injury: Teeth and Oropharynx as per pre-operative assessment

## 2016-02-12 NOTE — Progress Notes (Signed)
Patient claims "my phone is acting up". Apparently all the number he's calling went straight to voicemail. Still waiting for ride home.

## 2016-02-12 NOTE — Op Note (Signed)
    OPERATIVE NOTE   PROCEDURE: left second stage basilic vein transposition (brachiobasilic arteriovenous fistula) placement  PRE-OPERATIVE DIAGNOSIS: end stage renal disease   POST-OPERATIVE DIAGNOSIS: same as above   SURGEON: Adele Barthel, MD  ASSISTANT(S): RNFA  ANESTHESIA: general  ESTIMATED BLOOD LOSS: 50 cc  FINDING(S): 1.  Cubital vein and main basilic vein both 4-5 mm: excellent thrill in fistula after ligation of distal main basilic vein 2.  Fistula >6 mm except at cubital vein which was tapered 4-5 mm  SPECIMEN(S):  none  INDICATIONS:   Timothy Palmer is a 50 y.o. male who presents with end stage renal disease .  The patient is scheduled for left second stage basilic vein transposition.  The patient is aware the risks include but are not limited to: bleeding, infection, steal syndrome, nerve damage, ischemic monomelic neuropathy, failure to mature, and need for additional procedures.  The patient is aware of the risks of the procedure and elects to proceed forward.  DESCRIPTION: After full informed written consent was obtained from the patient, the patient was brought back to the operating room and placed supine upon the operating table.  Prior to induction, the patient received IV antibiotics.   After obtaining adequate anesthesia, the patient was then prepped and draped in the standard fashion for a left arm access procedure.  I turned my attention first to identifying the patient's brachiobasilic arteriovenous fistula.  Using SonoSite guidance, the location of this fistula was marked out on the skin.   I made an longitudinal incision over the fistula from its arterial anastomosis up to its axillary extent.  I carefully dissected the fistula away from its adjacent nerves.  Eventually the entirety of this fistula was mobilized and I dissected a plane on top of the bicipital fascia with electrocautery.  The fistula was secured in its new location with loosely placed interrupted  3-0 Vicryl stitches tied to side branches and the fascia.  The deep subcutaneous tissue was inspected for bleeding.  Bleeding was controlled with electrocautery and placement of large pieces of Avitene.  I washed out the surgical site after waiting a few minutes, and there was no further bleeding.  The fascia was reapproximated with interrupted stitches of 3-0 Vicryl to eliminate some of the deep space.  The superficial subcutaneous tissue was then reapproximated along the incision line with a running stitch of 3-0 Vicryl.  The skin was then reapproximated with staples.  The skin was then cleaned, dried, and dressed with a sterile Kerlix.  The patient tolerated this procedure well.    COMPLICATIONS: none  CONDITION: stable   Adele Barthel, MD [facility] Office: [phone] Pager: [phone]  02/12/2016, 9:44 AM

## 2016-02-12 NOTE — Anesthesia Postprocedure Evaluation (Signed)
Anesthesia Post Note  Patient: Newel Cleere Percival  Procedure(s) Performed: Procedure(s) (LRB): SECOND STAGE BASILIC VEIN TRANSPOSITION (Left)  Patient location during evaluation: PACU Anesthesia Type: General Level of consciousness: awake and alert Pain management: pain level controlled Vital Signs Assessment: post-procedure vital signs reviewed and stable Respiratory status: spontaneous breathing, nonlabored ventilation and respiratory function stable Cardiovascular status: blood pressure returned to baseline and stable Postop Assessment: no signs of nausea or vomiting Anesthetic complications: no    Last Vitals:  Vitals:   02/12/16 1038 02/12/16 1045  BP: (!) 155/104 (!) 145/101  Pulse: 70 78  Resp: 15 15  Temp:      Last Pain:  Vitals:   02/12/16 1038  TempSrc:   PainSc: 1                  Gerrick Ray A

## 2016-02-12 NOTE — Transfer of Care (Signed)
Immediate Anesthesia Transfer of Care Note  Patient: Timothy Palmer  Procedure(s) Performed: Procedure(s): SECOND STAGE BASILIC VEIN TRANSPOSITION (Left)  Patient Location: PACU  Anesthesia Type:General  Level of Consciousness: awake, alert , oriented and patient cooperative  Airway & Oxygen Therapy: Patient Spontanous Breathing and Patient connected to face mask oxygen  Post-op Assessment: Report given to RN and Post -op Vital signs reviewed and stable  Post vital signs: Reviewed and stable  Last Vitals:  Vitals:   02/12/16 0654 02/12/16 0955  BP: (!) 169/103   Pulse:    Resp:    Temp:  (!) 35.8 C    Last Pain:  Vitals:   02/12/16 0555  TempSrc: Oral         Complications: No apparent anesthesia complications

## 2016-02-12 NOTE — H&P (View-Only) (Signed)
Postoperative Access Visit   History of Present Illness  Timothy Palmer is a 50 y.o. year old male who presents for postoperative follow-up for: L 1st BVT (Date: 12/27/15).  The patient's wounds are healed.  The patient notes no steal symptoms.  The patient is able to complete their activities of daily living.  The patient's current symptoms are: aching in L hand with HD.  Past Medical History:  Diagnosis Date  . Acute combined systolic and diastolic congestive heart failure ([place]) 03/21/2014  . 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 Chronic diastolic congestive heart failure ([place])   . DVT of upper extremity (deep vein thrombosis) ([place]) 03/04/2014   Right arm  . ESRD (end stage renal disease) on dialysis ([place])    [place],Dialysis- T,Th,S  . Hypertension   . 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 The aneurysm measures 1.4 x 1.1 cm which is  significantly larger than the 4.5 mm parent vessel.   . Peripheral vascular disease ([place])   . Pneumonia 2014  . 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 Septic embolism to left lower extremity 03/25/2014  . Severe mitral regurgitation 03/23/2014   by TEE  . 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 AV FISTULA PLACEMENT Left ?2005   forearm   . BASCILIC VEIN TRANSPOSITION Left 12/27/2015   Procedure: FIRST STAGE BASILIC VEIN  TRANSPOSITION;  Surgeon: Conrad [place], MD;  Location: [facility];  Service: Vascular;  Laterality: Left;  . EMBOLECTOMY Left 03/25/2014   Procedure: EMBOLECTOMY left popliteal;  Surgeon: Rosetta Posner, MD;  Location: [facility];  Service: Vascular;  Laterality: Left;  . FEMORAL-POPLITEAL BYPASS GRAFT Left 03/25/2014   Procedure: Left Femoral- Below Knee Popliteal Bypass Graft;  Surgeon: Rosetta Posner, MD;  Location: [facility];  Service: Vascular;  Laterality: Left;  . INTRAOPERATIVE TRANSESOPHAGEAL ECHOCARDIOGRAM N/A 04/05/2014   Procedure: INTRAOPERATIVE TRANSESOPHAGEAL ECHOCARDIOGRAM;  Surgeon: Rexene Alberts, MD;  Location: [facility];  Service: Open Heart Surgery;  Laterality: N/A;  . LEFT AND RIGHT HEART CATHETERIZATION WITH CORONARY ANGIOGRAM N/A 03/28/2014   Procedure: LEFT AND RIGHT HEART CATHETERIZATION WITH CORONARY ANGIOGRAM;  Surgeon: Larey Dresser, MD;  Location[facility] CATH LAB;  Service: Cardiovascular;  Laterality: N/A;  . MITRAL VALVE REPAIR Right 04/05/2014   Procedure: MINIMALLY INVASIVE MITRAL VALVE REPAIR (MVR);  Surgeon: Rexene Alberts, MD;  Location: [facility];  Service: Open Heart Surgery;  Laterality: Right;  . MITRAL VALVE REPLACEMENT Right 04/05/2014   Procedure: Bring back MINIMALLY INVASIVE MITRAL VALVE (MV) REPLACEMENT - Reexploration for bleeding;  Surgeon: Rexene Alberts, MD;  Location: [facility];  Service: Open Heart Surgery;  Laterality: Right;  . PATCH ANGIOPLASTY Left 07/23/2013   Procedure: PATCH ANGIOPLASTY- LEFT RADIOCEPHALIC ARTERIOVENOUS FISTULA;  Surgeon: Angelia Mould, MD;  Location: [facility];  Service: Vascular;  Laterality: Left;  . SHUNTOGRAM Left November 04, 2011  . TEE WITHOUT CARDIOVERSION N/A  03/24/2014   Procedure: TRANSESOPHAGEAL ECHOCARDIOGRAM (TEE);  Surgeon: Larey Dresser, MD;  Location: [facility];  Service: Cardiovascular;  Laterality: N/A;  . TEE WITHOUT CARDIOVERSION N/A 04/12/2015   Procedure: TRANSESOPHAGEAL ECHOCARDIOGRAM (TEE);  Surgeon: Larey Dresser, MD;   Location: [facility];  Service: Cardiovascular;  Laterality: N/A;  . TEE WITHOUT CARDIOVERSION N/A 08/18/2015   Procedure: TRANSESOPHAGEAL ECHOCARDIOGRAM (TEE);  Surgeon: Larey Dresser, MD;  Location: [facility];  Service: Cardiovascular;  Laterality: N/A;    Social History   Social History  . Marital status: Single    Spouse name: N/A  . Number of children: N/A  . Years of education: N/A   Occupational History  . Not on file.   Social History Main Topics  . Smoking status: Never Smoker  . Smokeless tobacco: Never Used  . Alcohol use 0.0 oz/week  . Drug use:     Frequency: 7.0 times per week    Types: Marijuana     Comment: last use 2.9.17 @ 1900  . Sexual activity: No   Other Topics Concern  . Not on file   Social History Narrative  . No narrative on file    Family History  Problem Relation Age of Onset  . Diabetes Mother   . Hypertension Mother   . Hypertension Father   . Hypertension Sister   . Hypertension Brother     Current Outpatient Prescriptions  Medication Sig Dispense Refill  . aspirin 81 MG tablet Take 81 mg by mouth daily.    . cinacalcet (SENSIPAR) 90 MG tablet Take 1 tablet (90 mg total) by mouth 2 (two) times daily.    Marland Kitchen ibuprofen (ADVIL,MOTRIN) 200 MG tablet Take 200 mg by mouth every 6 (six) hours as needed.    Marland Kitchen lisinopril (PRINIVIL,ZESTRIL) 20 MG tablet Take 20 mg by mouth daily.    . multivitamin (RENA-VIT) TABS tablet Take 1 tablet by mouth at bedtime. 30 tablet 0  . Nutritional Supplements (FEEDING SUPPLEMENT, NEPRO CARB STEADY,) LIQD Take 237 mLs by mouth 2 (two) times daily between meals. (Patient taking differently: Take 237 mLs by mouth daily. )    . sevelamer carbonate (RENVELA) 800 MG tablet Take 1,600-2,400 mg by mouth 3 (three) times daily with meals.    Marland Kitchen oxyCODONE-acetaminophen (ROXICET) 5-325 MG tablet Take 1 tablet by mouth every 6 (six) hours as needed. (Patient not taking: Reported on 02/09/2016) 6 tablet 0   No current  facility-administered medications for this visit.      No Known Allergies   REVIEW OF SYSTEMS:  (Positives checked otherwise negative)  CARDIOVASCULAR:   [ ]  chest pain,  [ ]  chest pressure,  [ ]  palpitations,  [ ]  shortness of breath when laying flat,  [ ]  shortness of breath with exertion,   [ ]  pain in feet when walking,  [ ]  pain in feet when laying flat, [ ]  history of blood clot in veins (DVT),  [ ]  history of phlebitis,  [ ]  swelling in legs,  [ ]  varicose veins  PULMONARY:   [ ]  productive cough,  [ ]  asthma,  [ ]  wheezing  NEUROLOGIC:   [ ]  weakness in arms or legs,  [ ]  numbness in arms or legs,  [ ]  difficulty speaking or slurred speech,  [ ]  temporary loss of vision in one eye,  [ ]  dizziness  HEMATOLOGIC:   [ ]  bleeding problems,  [ ]  problems with blood clotting too easily  MUSCULOSKEL:   [ ]   joint pain, [ ]  joint swelling  GASTROINTEST:   [ ]  vomiting blood,  [ ]  blood in stool     GENITOURINARY:   [ ]  burning with urination,  [ ]  blood in urine [x]  ESRD-HD: T/R/S  PSYCHIATRIC:   [ ]  history of major depression  INTEGUMENTARY:   [ ]  rashes,  [ ]  ulcers  CONSTITUTIONAL:   [ ]  fever,  [ ]  chills   For VQI Use Only  PRE-ADM LIVING: Home  AMB STATUS: Ambulatory  Physical Examination Vitals:   02/09/16 0938 02/09/16 0940  BP: (!) 168/102 (!) 169/101  Pulse: 88    Pulmonary: Sym exp, good air movt, CTAB, no rales, rhonchi, & wheezing  Cardiac: RRR, Nl S1, S2, no Murmurs, rubs or gallops  LUE: Incision is healed, skin feels warm, hand grip is 5/5, sensation in digits is intact, palpable thrill, bruit can be auscultated, palpable radial pulse, On Sonosite: fistula >6 mm throughout  Medical Decision Making  Timothy Palmer is a 50 y.o. year old male who presents s/p L 1st BVT .  The patient's access is ready for transposition. Risk, benefits, and alternatives to access surgery were discussed.   The patient is aware the risks  include but are not limited to: bleeding, infection, steal syndrome, nerve damage, ischemic monomelic neuropathy, failure to mature, need for additional procedures, death and stroke.  The patient agrees to proceed forward with the procedure.  Will schedule patient of L 2nd stage BVT on 7 AUG 17  Thank you for allowing [place] to participate in this patient's care.  Adele Barthel, MD, FACS [facility] Office: [phone] Pager: [phone]

## 2016-02-12 NOTE — Interval H&P Note (Signed)
History and Physical Interval Note:  02/12/2016 7:19 AM  Timothy Palmer  has presented today for surgery, with the diagnosis of End Stage Renal Disease N18.6  The various methods of treatment have been discussed with the patient and family. After consideration of risks, benefits and other options for treatment, the patient has consented to  Procedure(s): SECOND STAGE BASILIC VEIN TRANSPOSITION (Left) as a surgical intervention .  The patient's history has been reviewed, patient examined, no change in status, stable for surgery.  I have reviewed the patient's chart and labs.  Questions were answered to the patient's satisfaction.     Adele Barthel

## 2016-02-13 ENCOUNTER — Telehealth: Payer: Self-pay | Admitting: Vascular Surgery

## 2016-02-13 ENCOUNTER — Encounter (HOSPITAL_COMMUNITY): Payer: Self-pay | Admitting: Vascular Surgery

## 2016-02-13 ENCOUNTER — Other Ambulatory Visit: Payer: Self-pay

## 2016-02-13 DIAGNOSIS — N186 End stage renal disease: Secondary | ICD-10-CM | POA: Diagnosis not present

## 2016-02-13 DIAGNOSIS — D631 Anemia in chronic kidney disease: Secondary | ICD-10-CM | POA: Diagnosis not present

## 2016-02-13 DIAGNOSIS — D509 Iron deficiency anemia, unspecified: Secondary | ICD-10-CM | POA: Diagnosis not present

## 2016-02-13 NOTE — Telephone Encounter (Signed)
-----   Message from Denman George, RN sent at 02/13/2016  9:32 AM EDT ----- Regarding: RE: needs 2 week and 4 week appts.  That works; the nurse can have the MD in office check incision if any concerns. Thanks. ----- Message ----- From: Georgiann Mccoy Sent: 02/13/2016   9:24 AM To: Joline Salt Pullins, RN Subject: RE: needs 2 week and 4 week appts.             The NP is out of the office for the whole week in 2 weeks. I sched the staple removal on the regular nurse sch.  ----- Message ----- From: Denman George, RN Sent: 02/12/2016   6:25 PM To: [facility] Admin Pool Subject: needs 2 week and 4 week appts.                 2 week NP appt. for staple removal 4 week post - op f/u with Dr. Bridgett Larsson   ----- Message ----- From: Conrad York, MD Sent: 02/12/2016   9:47 AM To: [address]  Timothy Palmer IN:[id number] 19-Jun-1966  Procedure: L 2nd stage BRVT  Asst: RNFA  Follow-up:  1.  2 weeks: nurse visit for staple removal 2.  4 weeks: MD visit for post-op

## 2016-02-13 NOTE — Telephone Encounter (Signed)
Sched staple removal 8/25 at 11:00 and MD 9/1 at 11:00. Lm on hm# to inform pt.

## 2016-02-13 NOTE — Telephone Encounter (Signed)
Joline Salt Pullins, RN  Georgiann Mccoy        If we can't get him in at the 3 week time frame with [facility], then look at 9/1; Looks like he has a couple openings that day, and that would very close to the 4 week mark.    Previous Messages    ----- Message -----  From: Georgiann Mccoy  Sent: 02/13/2016  9:27 AM  To: Joline Salt Pullins, RN  Subject: RE: needs 2 week and 4 week appts.        Also, [facility] is off at 4w and 5w and he has no scheduled extra days around that time.   ----- Message -----  From: Denman George, RN  Sent: 02/12/2016  6:25 PM  To: [facility] Admin Pool  Subject: needs 2 week and 4 week appts.          2 week NP appt. for staple removal  4 week post - op f/u with Dr. Bridgett Larsson    ----- Message -----  From: Conrad [place], MD  Sent: 02/12/2016  9:47 AM  To: [address]   Timothy Palmer  IN:[id number]  Sep 23, 1965   Procedure: L 2nd stage BRVT   Asst: RNFA   Follow-up:  1. 2 weeks: nurse visit for staple removal  2. 4 weeks: MD visit for post-op

## 2016-02-14 ENCOUNTER — Other Ambulatory Visit: Payer: Self-pay | Admitting: *Deleted

## 2016-02-14 DIAGNOSIS — I728 Aneurysm of other specified arteries: Secondary | ICD-10-CM

## 2016-02-14 DIAGNOSIS — I729 Aneurysm of unspecified site: Secondary | ICD-10-CM

## 2016-02-15 DIAGNOSIS — D509 Iron deficiency anemia, unspecified: Secondary | ICD-10-CM | POA: Diagnosis not present

## 2016-02-15 DIAGNOSIS — D631 Anemia in chronic kidney disease: Secondary | ICD-10-CM | POA: Diagnosis not present

## 2016-02-15 DIAGNOSIS — N186 End stage renal disease: Secondary | ICD-10-CM | POA: Diagnosis not present

## 2016-02-17 DIAGNOSIS — N186 End stage renal disease: Secondary | ICD-10-CM | POA: Diagnosis not present

## 2016-02-17 DIAGNOSIS — D509 Iron deficiency anemia, unspecified: Secondary | ICD-10-CM | POA: Diagnosis not present

## 2016-02-17 DIAGNOSIS — D631 Anemia in chronic kidney disease: Secondary | ICD-10-CM | POA: Diagnosis not present

## 2016-02-20 DIAGNOSIS — D509 Iron deficiency anemia, unspecified: Secondary | ICD-10-CM | POA: Diagnosis not present

## 2016-02-20 DIAGNOSIS — D631 Anemia in chronic kidney disease: Secondary | ICD-10-CM | POA: Diagnosis not present

## 2016-02-20 DIAGNOSIS — N186 End stage renal disease: Secondary | ICD-10-CM | POA: Diagnosis not present

## 2016-02-22 DIAGNOSIS — D631 Anemia in chronic kidney disease: Secondary | ICD-10-CM | POA: Diagnosis not present

## 2016-02-22 DIAGNOSIS — N186 End stage renal disease: Secondary | ICD-10-CM | POA: Diagnosis not present

## 2016-02-22 DIAGNOSIS — D509 Iron deficiency anemia, unspecified: Secondary | ICD-10-CM | POA: Diagnosis not present

## 2016-02-24 DIAGNOSIS — N186 End stage renal disease: Secondary | ICD-10-CM | POA: Diagnosis not present

## 2016-02-24 DIAGNOSIS — D509 Iron deficiency anemia, unspecified: Secondary | ICD-10-CM | POA: Diagnosis not present

## 2016-02-24 DIAGNOSIS — D631 Anemia in chronic kidney disease: Secondary | ICD-10-CM | POA: Diagnosis not present

## 2016-02-26 ENCOUNTER — Encounter (HOSPITAL_COMMUNITY): Payer: Medicare Other

## 2016-02-26 ENCOUNTER — Other Ambulatory Visit (HOSPITAL_COMMUNITY): Payer: Medicare Other

## 2016-02-26 ENCOUNTER — Ambulatory Visit: Payer: Medicare Other | Admitting: Family

## 2016-02-27 ENCOUNTER — Encounter: Payer: Self-pay | Admitting: Vascular Surgery

## 2016-02-27 DIAGNOSIS — N186 End stage renal disease: Secondary | ICD-10-CM | POA: Diagnosis not present

## 2016-02-27 DIAGNOSIS — D509 Iron deficiency anemia, unspecified: Secondary | ICD-10-CM | POA: Diagnosis not present

## 2016-02-27 DIAGNOSIS — D631 Anemia in chronic kidney disease: Secondary | ICD-10-CM | POA: Diagnosis not present

## 2016-02-29 DIAGNOSIS — D631 Anemia in chronic kidney disease: Secondary | ICD-10-CM | POA: Diagnosis not present

## 2016-02-29 DIAGNOSIS — Z5181 Encounter for therapeutic drug level monitoring: Secondary | ICD-10-CM | POA: Diagnosis not present

## 2016-02-29 DIAGNOSIS — Z7901 Long term (current) use of anticoagulants: Secondary | ICD-10-CM | POA: Diagnosis not present

## 2016-02-29 DIAGNOSIS — D509 Iron deficiency anemia, unspecified: Secondary | ICD-10-CM | POA: Diagnosis not present

## 2016-02-29 DIAGNOSIS — N186 End stage renal disease: Secondary | ICD-10-CM | POA: Diagnosis not present

## 2016-02-29 DIAGNOSIS — I481 Persistent atrial fibrillation: Secondary | ICD-10-CM | POA: Diagnosis not present

## 2016-03-02 DIAGNOSIS — N186 End stage renal disease: Secondary | ICD-10-CM | POA: Diagnosis not present

## 2016-03-02 DIAGNOSIS — D509 Iron deficiency anemia, unspecified: Secondary | ICD-10-CM | POA: Diagnosis not present

## 2016-03-02 DIAGNOSIS — D631 Anemia in chronic kidney disease: Secondary | ICD-10-CM | POA: Diagnosis not present

## 2016-03-05 DIAGNOSIS — D631 Anemia in chronic kidney disease: Secondary | ICD-10-CM | POA: Diagnosis not present

## 2016-03-05 DIAGNOSIS — D509 Iron deficiency anemia, unspecified: Secondary | ICD-10-CM | POA: Diagnosis not present

## 2016-03-05 DIAGNOSIS — N186 End stage renal disease: Secondary | ICD-10-CM | POA: Diagnosis not present

## 2016-03-07 DIAGNOSIS — D631 Anemia in chronic kidney disease: Secondary | ICD-10-CM | POA: Diagnosis not present

## 2016-03-07 DIAGNOSIS — N186 End stage renal disease: Secondary | ICD-10-CM | POA: Diagnosis not present

## 2016-03-07 DIAGNOSIS — D509 Iron deficiency anemia, unspecified: Secondary | ICD-10-CM | POA: Diagnosis not present

## 2016-03-07 DIAGNOSIS — Z992 Dependence on renal dialysis: Secondary | ICD-10-CM | POA: Diagnosis not present

## 2016-03-07 DIAGNOSIS — I120 Hypertensive chronic kidney disease with stage 5 chronic kidney disease or end stage renal disease: Secondary | ICD-10-CM | POA: Diagnosis not present

## 2016-03-07 IMAGING — CR DG CHEST 2V
2 series · 2 of 2 positions shown · non-contrast
Comparison: 07/23/2013

CLINICAL DATA: Shortness of breath

EXAM:
CHEST  2 VIEW

[w chest pa]
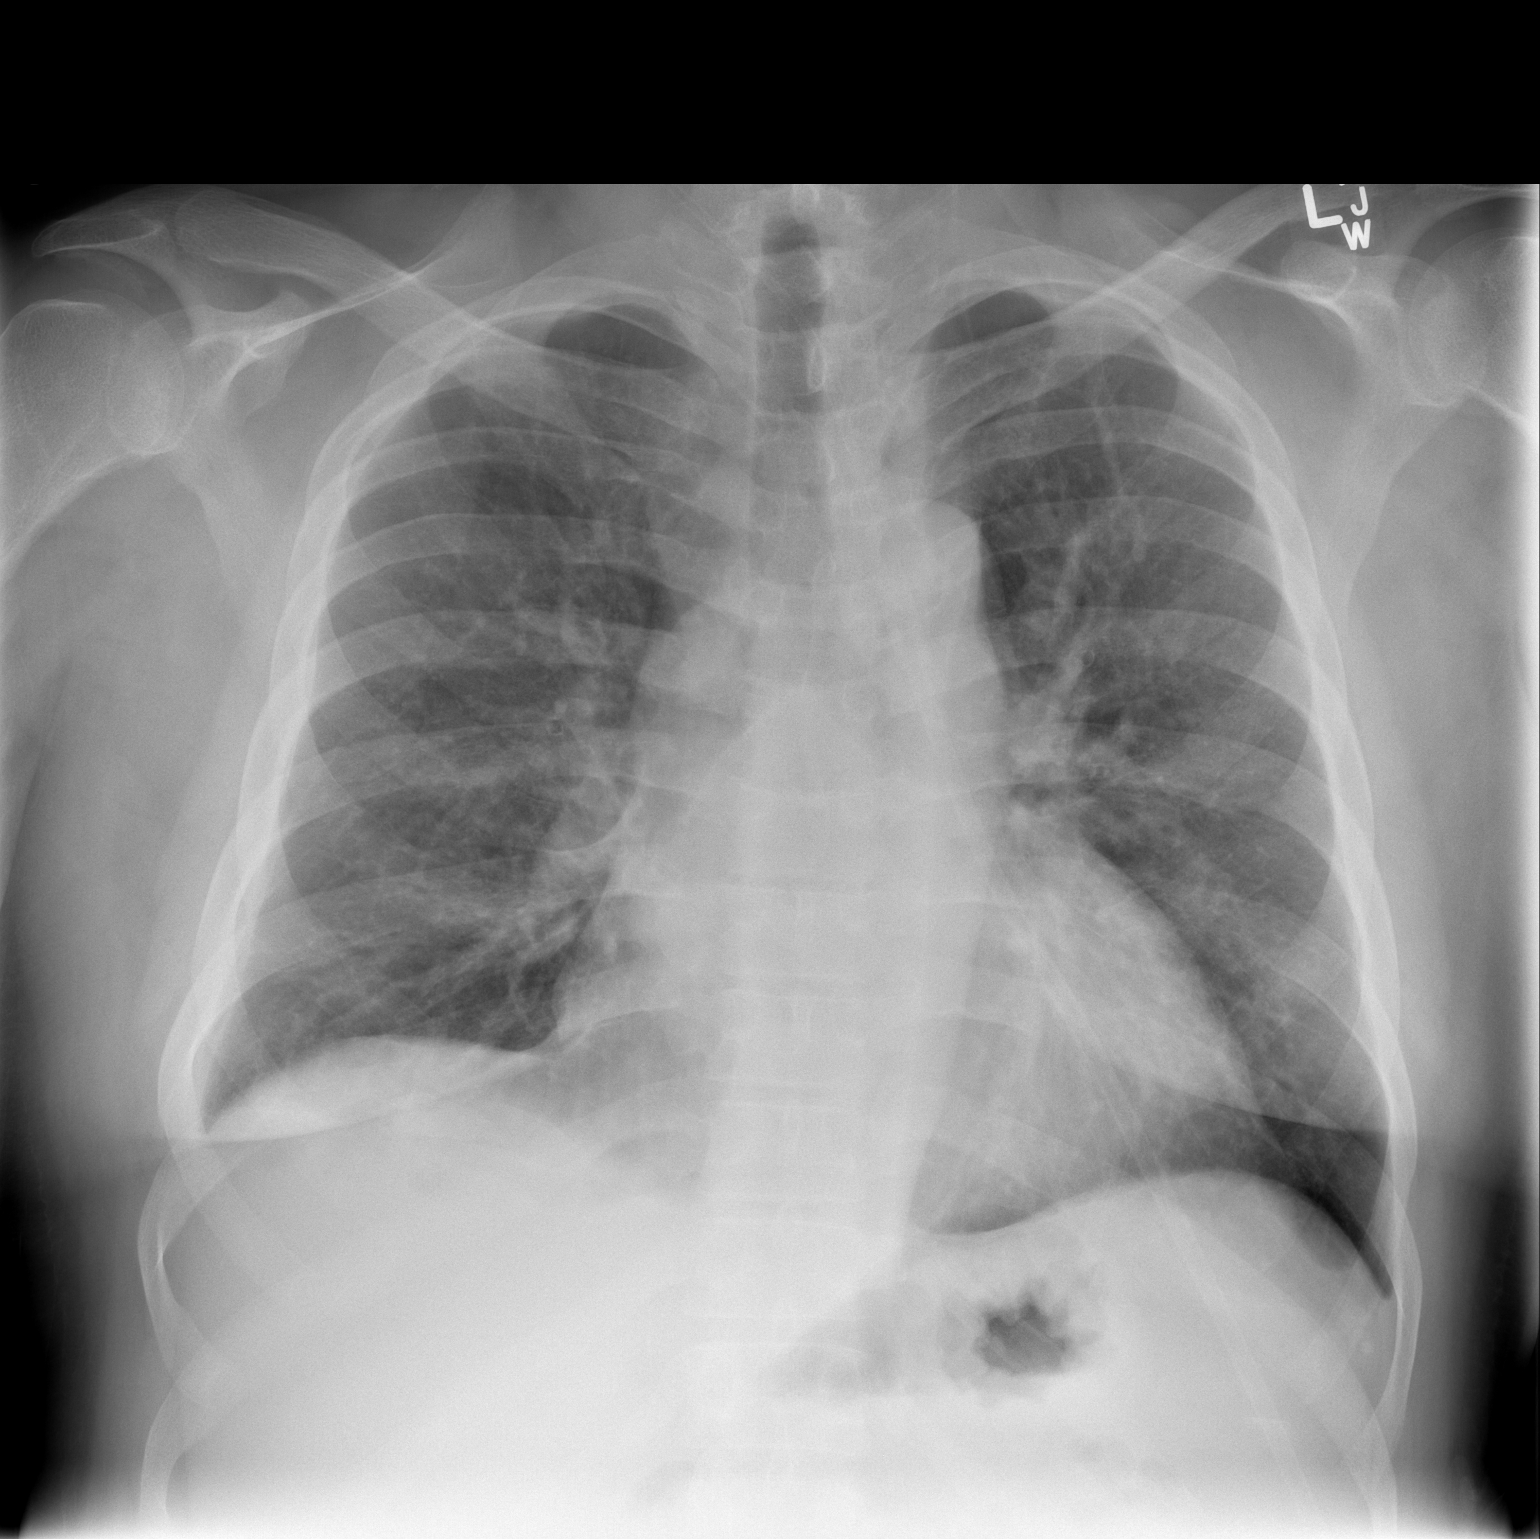

[w chest lat]
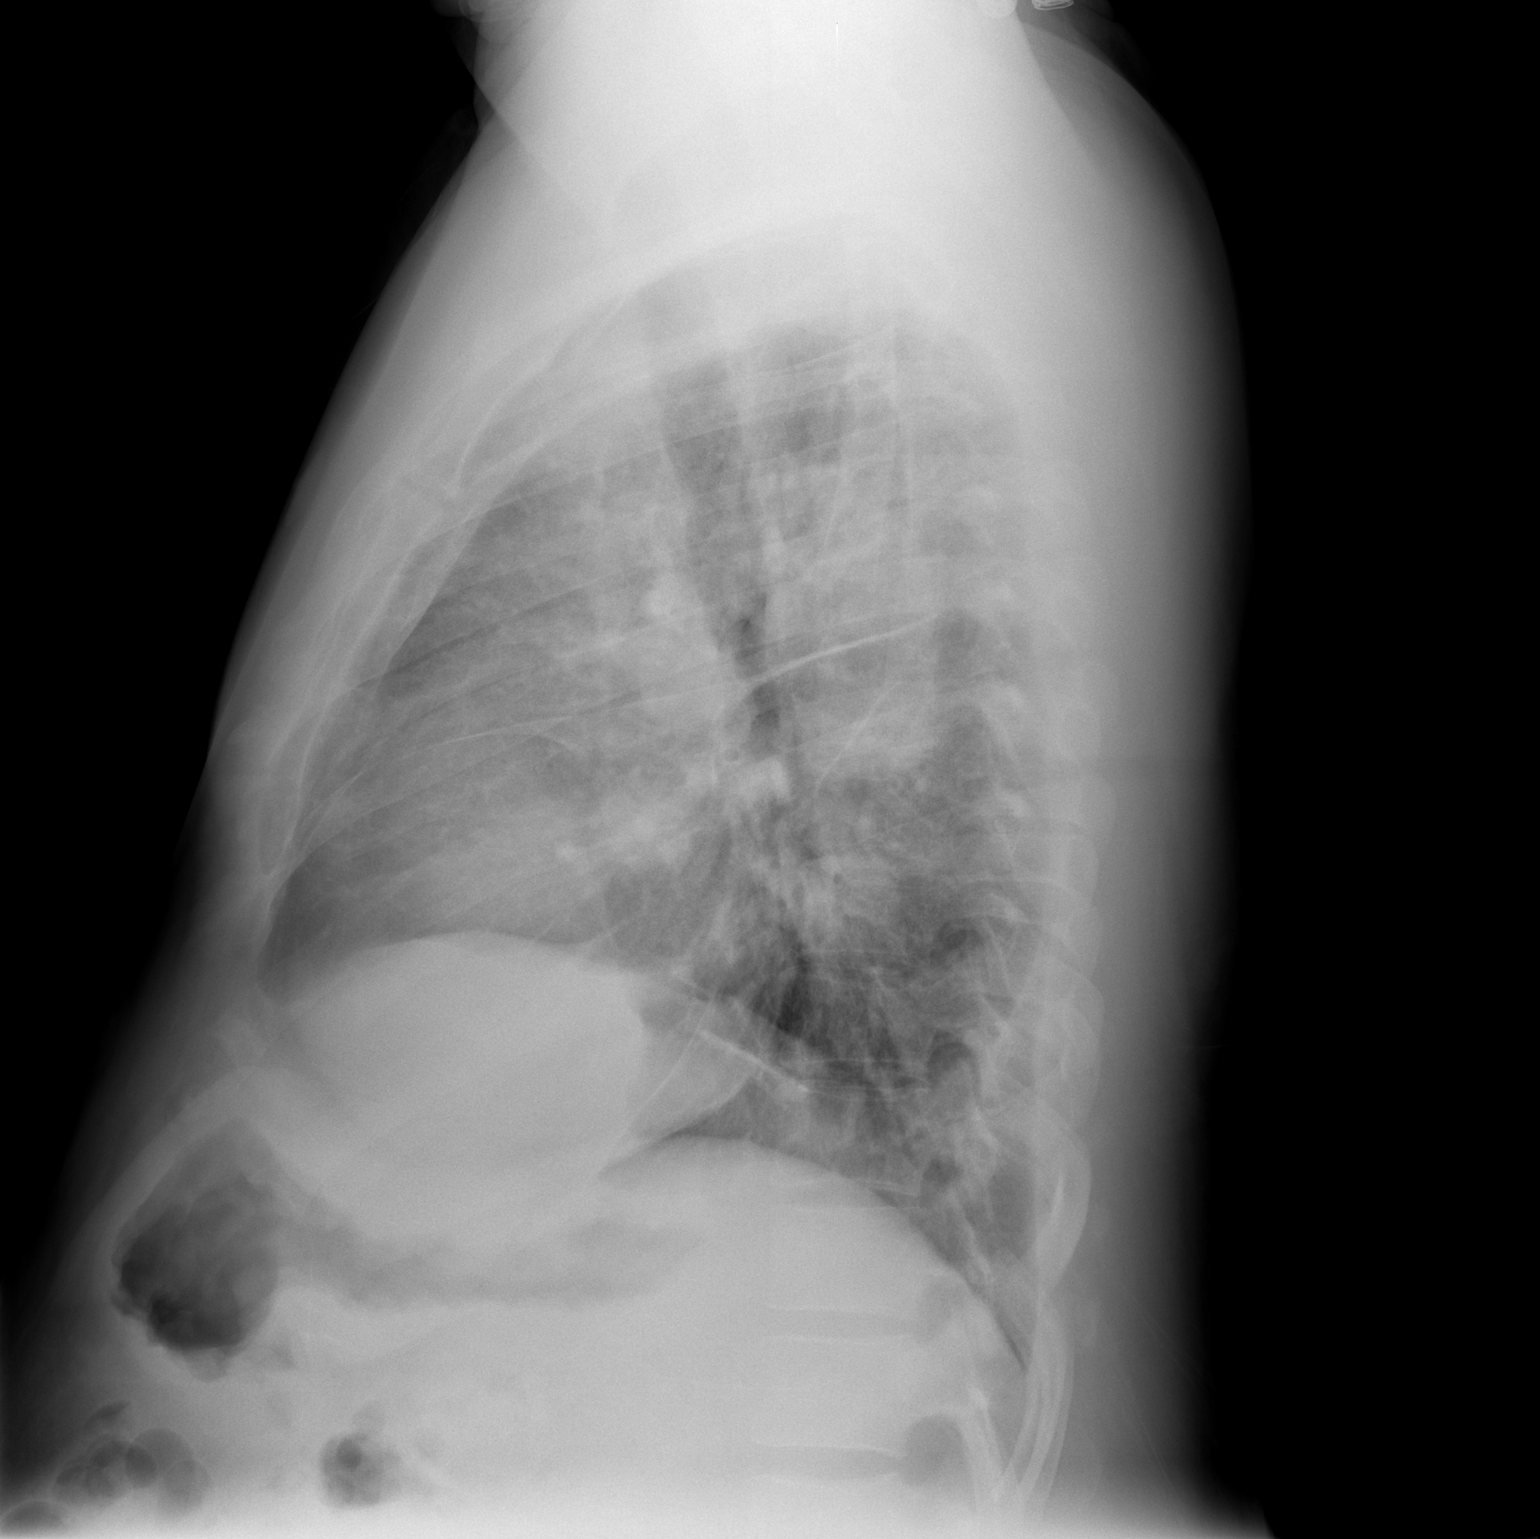

[2 of 2 positions shown; findings below may reference images not displayed]

FINDINGS: There is no focal parenchymal opacity, pleural effusion, or
pneumothorax. Mild pulmonary vascular congestion. There is mild
cardiomegaly.

The osseous structures are unremarkable.
IMPRESSION: Mild pulmonary vascular congestion. Otherwise no active
cardiopulmonary disease.

## 2016-03-08 ENCOUNTER — Encounter: Payer: Medicare Other | Admitting: Vascular Surgery

## 2016-03-09 DIAGNOSIS — N186 End stage renal disease: Secondary | ICD-10-CM | POA: Diagnosis not present

## 2016-03-09 DIAGNOSIS — E213 Hyperparathyroidism, unspecified: Secondary | ICD-10-CM | POA: Diagnosis not present

## 2016-03-09 DIAGNOSIS — D631 Anemia in chronic kidney disease: Secondary | ICD-10-CM | POA: Diagnosis not present

## 2016-03-09 DIAGNOSIS — D509 Iron deficiency anemia, unspecified: Secondary | ICD-10-CM | POA: Diagnosis not present

## 2016-03-09 IMAGING — CT CT ANGIO AOBIFEM WO/W CM
1 of 9 series · 2 of 16 positions shown, 3 images · IV contrast (Omni 300)
Comparison: CT chest 03/22/2014 ; radiographs of the left knee
03/23/2014

ADDENDUM:
The following should been included in the impression:

Inflammatory change in the subcutaneous fat overlying the deep
fascia of the left vastus lateralis muscle.
These result of the entire exam were called by telephone at the time
of interpretation on 03/25/2014 at [DATE] to Dr. Ferienhaus Erxleben , who
verbally acknowledged these results.
CLINICAL DATA: 47-year-old male with bacterial endocarditis and
acute onset left knee pain. Evaluate for aortoiliac embolic disease
or evidence of septic arthritis / septic embolization.
EXAM:
CT ANGIOGRAPHY OF ABDOMINAL AORTA WITH ILIOFEMORAL RUNOFF
TECHNIQUE: Multidetector CT imaging of the abdomen, pelvis and lower
extremities was performed using the standard protocol during bolus
administration of intravenous contrast. Multiplanar CT image
reconstructions and MIPs were obtained to evaluate the vascular
anatomy.
CONTRAST:  100mL OMNIPAQUE IOHEXOL 350 MG/ML SOLN

[Series 5: angiorunoff 3.0 i40f 1 · axial · 0.90mm/px · z∈[-939,-462]mm · 2 of 479 slices shown, 3 images]
[im 160/479  soft-tissue]
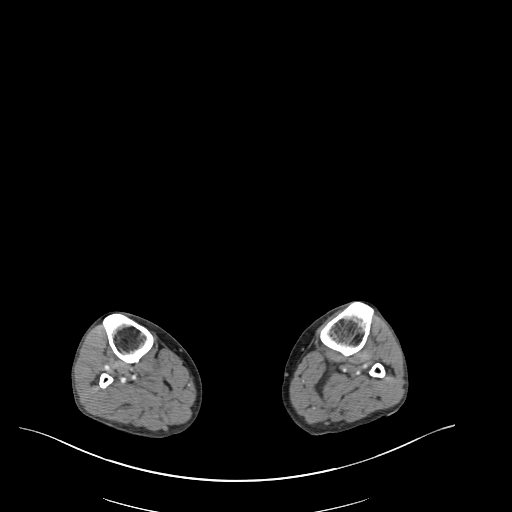
[im 160/479  bone]
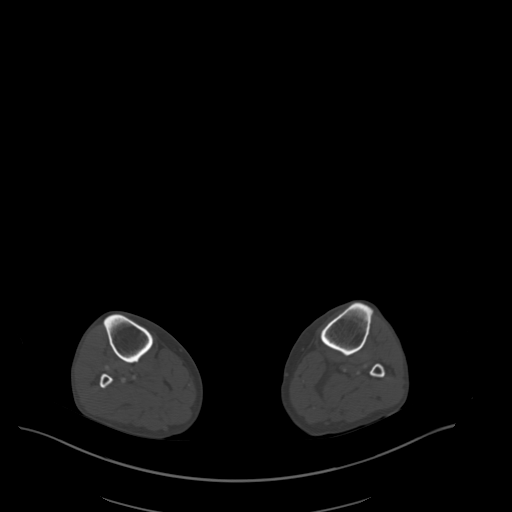
[im 319/479  soft-tissue]
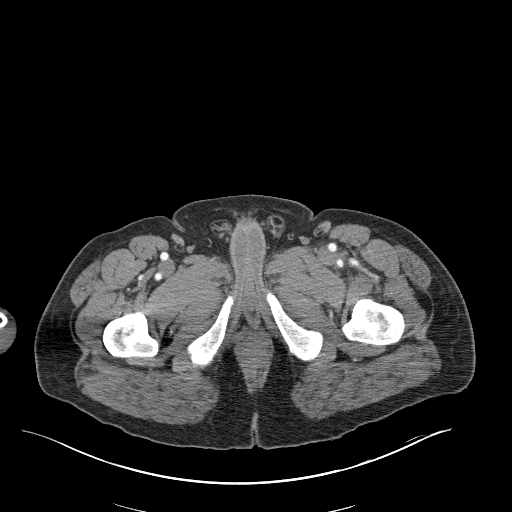

[2 of 16 positions shown; findings below may reference images not displayed]

FINDINGS: VASCULAR

Aorta: Normal caliber aorta. No significant atherosclerotic plaque
or irregular wall thickening. No inflammation common dissection or
aneurysmal dilatation.

Celiac: Widely patent. Conventional hepatic arterial anatomy. No
evidence of mycotic or other visceral artery aneurysm.

SMA: Widely patent. There is a focal fusiform aneurysm with mild
hazy arterial wall thickening arising from a distal ileal branch of
the SMA beyond the ileocecal branches to the right of midline in the
mid to lower abdomen. Aneurysm measures 1.4 x 1.0 cm in diameter
which is significantly larger than the 4.4 mm parent vessel.

Renals: Abhorrent right renal artery anatomy. There is a small
retrocaval accessory renal artery to the upper pole. The dominant
renal artery originates low on the aorta beyond the origin of the
IMA. On the left, there is a single dominant renal artery and
typical anatomic position. Mild focal heterogeneous atherosclerotic
plaque versus focal changes of fibromuscular dysplasia results in at
least moderate stenosis of the proximal left renal artery.
Evaluation is slightly limited by focal motion artifact. The 2
right-sided renal arteries appear patent.

IMA: Widely patent and unremarkable.

RIGHT Lower Extremity

Inflow: Widely patent and unremarkable comment, internal and
external iliac arteries. Trace plaque along the distal common iliac
artery without stenosis.

Outflow: The common, profunda wall and superficial femoral arteries
are relatively disease free and widely patent. No evidence of
pathology. Calcified atherosclerotic plaque is noted in the above
the knee popliteal artery resulting in moderate focal stenosis.
Remainder the popliteal artery is widely patent.

Runoff: Mild multifocal narrowing of the posterior tibial artery.
Three vessel runoff patent to the ankle.

LEFT lower Extremity

Inflow: Widely patent common, internal and external iliac arteries.

Outflow: The common, profunda and superficial femoral arteries are
widely patent. There is trace atherosclerotic plaque in the
superficial femoral artery distribution without significant
stenosis. The entire 5.5 cm length of the left popliteal artery is
abnormal. The vessel is nearly occluded proximally with a linear
defects which can best be described as a focal dissection. There is
irregular inflammatory stranding surrounding the vessel involving
the lateral head of the gastrocnemius muscle in the
intracompartmental fat within the upper calf. The arterial wall is
thickened and poorly defined. The mid segment of the vessel at the
level of the knee is diffusely irregular and heterogeneous with
intermittent contrast opacification in the lumen. There is a short
segment complete occlusion secondary to intraluminal filling defect
which then extends distally as a semi occlusive thrombus to the
level of the tibial plateau. Below the knee, the popliteal artery
regains patency just proximal to the trifurcation.

Runoff: Slightly aberrant trifurcation anatomy. The posterior tibial
artery arises proximally hand and there is a anterior
tibial-peroneal trunk. The runoff vessels remain patent to the
ankle.

Veins: No focal venous abnormality.

Review of the MIP images confirms the above findings.

NON-VASCULAR

Lower Chest: Incidentally noted gynecomastia. Small right pleural
effusion. Calcified right pleural plaque. Combination dependent
atelectasis and patchy consolidation in the lower lobes. Trace
atherosclerotic calcifications in the coronary arteries. Borderline
cardiomegaly. No pericardial effusion. Unremarkable thoracic
esophagus.

Abdomen: Unremarkable appearance of the stomach, duodenum, and
adrenal glands. There is an intermediate attenuation 4.1 x 4.1 by
2.6 cm lesion in the superior pole of the spleen concerning for
possible septic abscess. Normal hepatic contour and configuration.
No discrete hepatic lesion. Multiple small stones within the
gallbladder lumen and neck. No secondary signs to suggest acute
cholecystitis. The portal veins remain patent.

The bilateral kidneys are nearly entirely replaced with cysts. The
kidneys are also slightly enlarged. No definite enhancing renal
lesion. Evaluation is limited by mild motion artifact. Colonic
diverticular disease without CT evidence of active inflammation. No
evidence of obstruction or focal bowel wall thickening. Normal
appendix in the right lower quadrant. The terminal ileum is
unremarkable. No free fluid or suspicious adenopathy. There is a
cm peripherally calcified structure within the small bowel
mesenteric left of midline which measures 1.1 cm in diameter (image
105 series 5). This structure abuts the mesenteric border of the
adjacent small bowel. No surrounding desmoplastic change or
inflammation.

Pelvis: Abnormal left external iliac station lymph nodes. Although
not enlarged by CT criteria (7 and 5 mm in short axis) there is
interstitial inflammatory stranding in the perinodal fat. These are
concerning for reactive adenopathy versus adenitis given the
patient's known clinical history. No necrosis or abscess. Under
distended but diffusely thick-walled bladder. Unremarkable prostate
and seminal vesicles. Calcification of the bilateral vas deferens.
No free fluid.

Bones/Soft Tissues: No acute fracture or aggressive appearing lytic
or blastic osseous lesion. Transitional anatomy with sacralization
of L5. Focal degenerative disc disease at L4-L5 with vacuum disc
phenomenon. Localized inflammatory change of the deeper muscular
fascia overlying the vastus lateralis muscle in the left thigh.
IMPRESSION: VASCULAR

1. Diffusely abnormal left popliteal artery. The findings most
likely represent a combination of septic embolus, mycotic arteritis
and dissection. The entire 5.5 cm length of the left popliteal
artery is abnormal with dissection proximally in the above the knee
popliteal artery, focal complete complete occlusion at the level of
the joint and partially occluding septic embolus extending to the
tibial plateau. There is surrounding inflammatory change involving
the deep intermuscular fat of the upper calf and the lateral head of
the gastrocnemius muscle.
2. Focal mycotic aneurysm of the distal ileal branch of the superior
mesenteric artery. The aneurysm measures 1.4 x 1.1 cm which is
significantly larger than the 4.5 mm parent vessel. No evidence of
downstream occlusion or small bowel ischemia.
3. Aberrant left trifurcation anatomy. The posterior tibial artery
arises proximally bleeding in anterior tibial - peroneal trunk which
then bifurcates. Three vessel runoff remains patent to the ankle.
4. Focal moderate stenosis of the proximal left renal artery.
Secondary to motion artifact is unclear if this represents focal
fibro fatty atherosclerotic plaque or focal changes of FMD.
5. Aberrant right renal artery anatomy as described above without
stenosis or FMD.
NON VASCULAR

1. Probable 4.1 cm septic abscess in the upper pole of the spleen.
Additional differential considerations include splenic hemangioma,
hammertoe mal or complex cyst although these are all considered
significantly less likely given the patient's overall clinical
picture.
2. Left external iliac station adenitis, likely reactive and related
to the patient's multifocal septic emboli.
3. Bilateral renal enlargement with innumerable cysts of varying
complexity. Differential considerations include dialysis associated
acquired cystic kidney disease and autosomal dominant polycystic
kidney disease. The former is favored.
4. 1.1 cm peripherally calcified structure within the small bowel
mesenteric fat adjacent to a loop of distal jejunum/ileum left of
midline without evidence of surrounding desmoplastic change. This
finding is nonspecific and may represent sequelae of old
granulomatous disease, a peripherally calcified duplication cyst, or
the residua of prior fat necrosis. Carcinoid or desmoid tumor is
considered significantly less likely given the complete absence of
surrounding desmoplastic change. Recommend followup CT scan of the
abdomen and pelvis in 1 year to confirm stability.
5. Under distended but diffusely thick-walled bladder. Recommend
clinical correlation for signs and symptoms of cystitis.
6. Cholelithiasis without secondary signs to suggest acute
cholecystitis.
7. Scattered colonic diverticula without evidence of active
diverticulitis.
8. Transitional L5 anatomy with associated focal L4-L5 degenerative
disc disease.

## 2016-03-12 DIAGNOSIS — D631 Anemia in chronic kidney disease: Secondary | ICD-10-CM | POA: Diagnosis not present

## 2016-03-12 DIAGNOSIS — D509 Iron deficiency anemia, unspecified: Secondary | ICD-10-CM | POA: Diagnosis not present

## 2016-03-12 DIAGNOSIS — N186 End stage renal disease: Secondary | ICD-10-CM | POA: Diagnosis not present

## 2016-03-12 DIAGNOSIS — E213 Hyperparathyroidism, unspecified: Secondary | ICD-10-CM | POA: Diagnosis not present

## 2016-03-13 ENCOUNTER — Inpatient Hospital Stay (HOSPITAL_COMMUNITY)
Admission: RE | Admit: 2016-03-13 | Discharge: 2016-03-13 | Disposition: A | Discharge status: Home / Self Care | Payer: Medicare Other | Source: Ambulatory Visit

## 2016-03-13 NOTE — Progress Notes (Signed)
Called patient to see if he was coming to his PAT appointment.  Patient stated "his legs were bothering him and he wasn't coming".  instructed patient that he will need to reschedule his appointment.

## 2016-03-14 DIAGNOSIS — D509 Iron deficiency anemia, unspecified: Secondary | ICD-10-CM | POA: Diagnosis not present

## 2016-03-14 DIAGNOSIS — D631 Anemia in chronic kidney disease: Secondary | ICD-10-CM | POA: Diagnosis not present

## 2016-03-14 DIAGNOSIS — E213 Hyperparathyroidism, unspecified: Secondary | ICD-10-CM | POA: Diagnosis not present

## 2016-03-14 DIAGNOSIS — N186 End stage renal disease: Secondary | ICD-10-CM | POA: Diagnosis not present

## 2016-03-19 DIAGNOSIS — D509 Iron deficiency anemia, unspecified: Secondary | ICD-10-CM | POA: Diagnosis not present

## 2016-03-19 DIAGNOSIS — E213 Hyperparathyroidism, unspecified: Secondary | ICD-10-CM | POA: Diagnosis not present

## 2016-03-19 DIAGNOSIS — N186 End stage renal disease: Secondary | ICD-10-CM | POA: Diagnosis not present

## 2016-03-19 DIAGNOSIS — D631 Anemia in chronic kidney disease: Secondary | ICD-10-CM | POA: Diagnosis not present

## 2016-03-21 ENCOUNTER — Encounter: Payer: Self-pay | Admitting: Vascular Surgery

## 2016-03-21 ENCOUNTER — Encounter (HOSPITAL_COMMUNITY): Payer: Self-pay | Admitting: Surgery

## 2016-03-21 DIAGNOSIS — E213 Hyperparathyroidism, unspecified: Secondary | ICD-10-CM | POA: Diagnosis not present

## 2016-03-21 DIAGNOSIS — N186 End stage renal disease: Secondary | ICD-10-CM | POA: Diagnosis not present

## 2016-03-21 DIAGNOSIS — D509 Iron deficiency anemia, unspecified: Secondary | ICD-10-CM | POA: Diagnosis not present

## 2016-03-21 DIAGNOSIS — D631 Anemia in chronic kidney disease: Secondary | ICD-10-CM | POA: Diagnosis not present

## 2016-03-21 DIAGNOSIS — N2581 Secondary hyperparathyroidism of renal origin: Secondary | ICD-10-CM | POA: Diagnosis present

## 2016-03-21 MED ORDER — CEFAZOLIN SODIUM-DEXTROSE 2-4 GM/100ML-% IV SOLN
2.0000 g | INTRAVENOUS | Status: AC
  Administered 2016-03-22: 2 g via INTRAVENOUS
  Filled 2016-03-21: qty 100

## 2016-03-21 NOTE — Progress Notes (Signed)
Anesthesia Chart Review: SAME DAY WORK-UP. (Patient was a no show for his 03/13/16 PAT visit.)  Patient is a 50 year old male scheduled for parathyroidectomy autotransplant 03/22/16 by Dr. Harlow Asa.  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 Donnetta Hutching; per communication with [facility] RN Colletta Maryland, Dr. Donnetta Hutching to re-evaluate by CTA in 04/2016), non-smoker, HTN, right radial DVT 03/04/14.   Nephrologist is Dr. Corliss Parish. ID is Dr. Baxter Flattery.  Pulmonologist is Dr. Ashok Cordia. CT surgeon is Dr. Roxy Manns. Primary/HF cardiologist is Dr. Aundra Dubin. Last office visit 05/19/15 with last procedure (TEE) by Dr. Aundra Dubin on 08/18/15.   Meds list includes ASA 81mg , Sensipar, lisinopril, Renvela. Amiodarone and warfarin discontinued at his 05/19/15 cardiology visit.  04/05/15 EKG: NSR, incomplete right BBB, possible RVH, T wave abnormality, consider anterior ischemia, prolonged QT (QT 440ms, QTc 519ms).  08/18/15 TEE: Study Conclusions - Left ventricle: The cavity size was normal. There was mild  concentric hypertrophy. Systolic function was normal. The  estimated ejection fraction was in the range of 55% to 60%. Wall  motion was normal; there were no regional wall motion  abnormalities. - Aortic valve: Trileaflet aortic valve with relatively fixed right  coronary cusp,  mild aortic stenosis. No aortic insufficiency, no  vegetation. - Aorta: Normal caliber aorta with grade III-IV plaque in  descending thoracic aorta. - Mitral valve: The mitral valve is status post repair. There is a  7 mm x 2 mm mobile vegetation attached to the anterior sewing  ring. There is trivial MR and probably no significant mitral  stenosis. - Left atrium: The atrium was mildly dilated. No evidence of  thrombus in the atrial cavity or appendage. - Right ventricle: The cavity size was normal. Systolic function  was normal. - Right atrium: No evidence of thrombus in the atrial cavity or  appendage. - Tricuspid valve: No evidence of vegetation. - Pulmonic valve: No evidence of vegetation. Impressions: - Persistent mitral valve vegetation (small) attached to the  anterior sewing ring. Will send blood cultures and consult ID.  03/28/14 RHC/LHC: Coronary angiography: Coronary dominance: right Left mainstem: No significant disease.  Left anterior descending (LAD): No angiographic CAD.  Left circumflex (LCx): Large ramus. No angiographic CAD in LCx system.  Right coronary artery (RCA): No angiographic CAD.  Left ventriculography: Not done with elevated PCWP/LVEDP.  Final Conclusions: Elevated left and right heart filling pressures with preserved cardiac output. Primarily pulmonary venous hypertension but also suspect with PVR 4 that there is a component of perhaps reactive pulmonary vasoconstriction in the setting of severe MR. Prominent V-waves in PCWP tracing. No angiographic coronary disease.  Recommendations: Needs HD for volume removal prior to MV surgery.  04/02/14 Carotid U/S: Summary: Bilateral: intimal wall thickening CCA. Mild soft plaque origin ICA. 1-39% ICA stenosis. Vertebral artery flow is antegrade. ICA/CCA ratio: R-1.0 L-0.70.   05/15/15 Chest CT: IMPRESSION: 1. Interval resolution of the multiple sub solid pulmonary nodules seen previously.  Trace linear scar is seen at  the location of some of the larger nodules. No new or progressive pulmonary lesion on today's study. 2. Interval resolution of the patchy ground-glass attenuation seen bilaterally in the lungs on the prior study. 3. 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 4. Interval decrease in mediastinal lymphadenopathy. 5. Cholelithiasis. 6. Incomplete visualization of the kidneys, better assessed on abdominal CT of 04/06/2015.  He will need labs on arrival. He tolerated vascular access procedures in June and August (GA with LMA). Further evaluation by this anesthesiologist and surgeon on the day of surgery to ensure no acute issues prior to proceeding.   George Hugh [facility] Short Stay Center/Anesthesiology Phone [phone] 03/21/2016 1:31 PM

## 2016-03-21 NOTE — Progress Notes (Signed)
Spoke with pt for pre-op call. Pt has hx of mitral valve replacement and CHF. He denies any recent chest pain or sob. Last visit with Dr. Haroldine Laws was 05/19/15 with instructions to f/u in 6 months. No record of pt being seen since. Pt states he sees so many doctors he's not sure who he has seen. No visit to Dr. Aundra Dubin since February when pt had an Echo done. Chart given to Merilynn Finland, PA for review.

## 2016-03-21 NOTE — H&P (Signed)
General Surgery [facility]  Martin L. Bergdoll DOB: [birth year] Single / Language: [place] / Race: Black or African American Male  History of Present Illness  The patient is a 50 year old male who presents with secondary hyperparathyroidism.  Patient is referred by Dr. [name] from [place] [facility] for evaluation and treatment of secondary hyperparathyroidism. Patient has been on hemodialysis for over 13 years. End-stage renal disease is due to hypertension. Patient currently has a diatech catheter in the right subclavian position. He dialyzes on Tuesdays Thursdays and Saturdays. He is undergoing vascular surgical procedures in the left upper extremity to restore access. Patient has been on Sensipar. Unfortunately laboratory studies show a markedly elevated intact PTH level of 2712, an elevated calcium level of 11.1, and elevated phosphorus level of 7.5, and a calcium times phosphorous product of 84. Patient complains of fatigue. He complains of bone and joint pain particularly in his ankles and hips. Patient presents today to discuss total parathyroidectomy with autotransplantation. Of note, patient has a history of cardiac valvular surgery. He is no longer on anticoagulation. He has not followed by cardiology.   Other Problems Chronic Renal Failure Syndrome High blood pressure  Past Surgical History Dialysis Shunt / Fistula Valve Replacement  Allergies No Known Allergies07/31/2017  Medication History Aspirin (81MG  Tablet DR, Oral) Active. Lisinopril (20MG  Tablet, Oral) Active. Sensipar (90MG  Tablet, Oral) Active. Renal Multivitamin Formula (Oral) Active. Nutritional Supplement (Oral) Active. Medications Reconciled  Social History Alcohol use Occasional alcohol use. Illicit drug use Prefer to discuss with provider. No caffeine use  Family History Hypertension Brother, Father, Mother, Sister.  Review of Systems General  Present- Fatigue. Not Present- Appetite Loss, Chills, Fever, Night Sweats, Weight Gain and Weight Loss. Skin Not Present- Change in Wart/Mole, Dryness, Hives, Jaundice, New Lesions, Non-Healing Wounds, Rash and Ulcer. HEENT Not Present- Earache, Hearing Loss, Hoarseness, Nose Bleed, Oral Ulcers, Ringing in the Ears, Seasonal Allergies, Sinus Pain, Sore Throat, Visual Disturbances, Wears glasses/contact lenses and Yellow Eyes. Respiratory Present- Chronic Cough and Difficulty Breathing. Not Present- Bloody sputum, Snoring and Wheezing. Cardiovascular Present- Difficulty Breathing Lying Down. Not Present- Chest Pain, Leg Cramps, Palpitations, Rapid Heart Rate, Shortness of Breath and Swelling of Extremities. Gastrointestinal Not Present- Abdominal Pain, Bloating, Bloody Stool, Change in Bowel Habits, Chronic diarrhea, Constipation, Difficulty Swallowing, Excessive gas, Gets full quickly at meals, Hemorrhoids, Indigestion, Nausea, Rectal Pain and Vomiting. Male Genitourinary Not Present- Blood in Urine, Change in Urinary Stream, Frequency, Impotence, Nocturia, Painful Urination, Urgency and Urine Leakage. Psychiatric Not Present- Anxiety, Bipolar, Change in Sleep Pattern, Depression, Fearful and Frequent crying. Endocrine Not Present- Cold Intolerance, Excessive Hunger, Hair Changes, Heat Intolerance, Hot flashes and New Diabetes. Hematology Not Present- Blood Thinners, Easy Bruising, Excessive bleeding, Gland problems, HIV and Persistent Infections.  Vitals Weight: 229.8 lb Height: 71in Body Surface Area: 2.24 m Body Mass Index: 32.05 kg/m  Temp.: 97.75F(Oral)  Pulse: 85 (Regular)  BP: 168/102 (Sitting, Left Arm, Standard) Pt has not had BP medication this morning  Physical Exam The physical exam findings are as follows: Note:General - appears comfortable, no distress; not diaphorectic  HEENT - normocephalic; sclerae clear, gaze conjugate; mucous membranes moist, dentition good;  voice normal  Neck - symmetric on extension; no palpable anterior or posterior cervical adenopathy; no palpable masses in the thyroid bed  Chest - clear bilaterally without rhonchi, rales, or wheeze  Cor - regular rhythm with normal rate; no significant murmur  Ext - non-tender without significant edema or lymphedema  Neuro - grossly intact; no tremor   Assessment & Plan  SECONDARY HYPERPARATHYROIDISM, RENAL (N25.81)  Patient is referred by nephrology for evaluation for total parathyroidectomy with autotransplantation. Patient denied discussed the procedure at length. We reviewed his laboratory studies. We discussed the hospital stay to be anticipated. We discussed the location of the surgical incisions. We discussed the possibility of recurrence. He understands and wishes to proceed with surgery in the near future.  The risks and benefits of the procedure have been discussed at length with the patient. The patient understands the proposed procedure, potential alternative treatments, and the course of recovery to be expected. All of the patient's questions have been answered at this time. The patient wishes to proceed with surgery.  [name], MD, [facility] Office: [phone]

## 2016-03-22 ENCOUNTER — Encounter (HOSPITAL_COMMUNITY): Payer: Self-pay | Admitting: Anesthesiology

## 2016-03-22 ENCOUNTER — Inpatient Hospital Stay (HOSPITAL_COMMUNITY)
Admission: RE | Admit: 2016-03-22 | Discharge: 2016-03-28 | DRG: 674 | Disposition: A | Discharge status: Home / Self Care | Payer: Medicare Other | Source: Ambulatory Visit | Attending: Surgery | Admitting: Surgery

## 2016-03-22 ENCOUNTER — Encounter (HOSPITAL_COMMUNITY)
Admission: RE | Disposition: A | Discharge status: Home / Self Care | Payer: Self-pay | Source: Ambulatory Visit | Attending: Surgery

## 2016-03-22 ENCOUNTER — Inpatient Hospital Stay (HOSPITAL_COMMUNITY): Payer: Medicare Other | Admitting: Certified Registered Nurse Anesthetist

## 2016-03-22 DIAGNOSIS — N2581 Secondary hyperparathyroidism of renal origin: Secondary | ICD-10-CM | POA: Diagnosis not present

## 2016-03-22 DIAGNOSIS — R599 Enlarged lymph nodes, unspecified: Secondary | ICD-10-CM | POA: Diagnosis present

## 2016-03-22 DIAGNOSIS — L7632 Postprocedural hematoma of skin and subcutaneous tissue following other procedure: Secondary | ICD-10-CM | POA: Diagnosis not present

## 2016-03-22 DIAGNOSIS — I5042 Chronic combined systolic (congestive) and diastolic (congestive) heart failure: Secondary | ICD-10-CM | POA: Diagnosis present

## 2016-03-22 DIAGNOSIS — D649 Anemia, unspecified: Secondary | ICD-10-CM | POA: Diagnosis present

## 2016-03-22 DIAGNOSIS — I5041 Acute combined systolic (congestive) and diastolic (congestive) heart failure: Secondary | ICD-10-CM | POA: Diagnosis not present

## 2016-03-22 DIAGNOSIS — Z992 Dependence on renal dialysis: Secondary | ICD-10-CM

## 2016-03-22 DIAGNOSIS — I4510 Unspecified right bundle-branch block: Secondary | ICD-10-CM | POA: Diagnosis present

## 2016-03-22 DIAGNOSIS — D631 Anemia in chronic kidney disease: Secondary | ICD-10-CM | POA: Diagnosis not present

## 2016-03-22 DIAGNOSIS — Z8249 Family history of ischemic heart disease and other diseases of the circulatory system: Secondary | ICD-10-CM

## 2016-03-22 DIAGNOSIS — I272 Other secondary pulmonary hypertension: Secondary | ICD-10-CM | POA: Diagnosis not present

## 2016-03-22 DIAGNOSIS — I120 Hypertensive chronic kidney disease with stage 5 chronic kidney disease or end stage renal disease: Secondary | ICD-10-CM | POA: Diagnosis not present

## 2016-03-22 DIAGNOSIS — I132 Hypertensive heart and chronic kidney disease with heart failure and with stage 5 chronic kidney disease, or end stage renal disease: Secondary | ICD-10-CM | POA: Diagnosis present

## 2016-03-22 DIAGNOSIS — Z7982 Long term (current) use of aspirin: Secondary | ICD-10-CM

## 2016-03-22 DIAGNOSIS — E8770 Fluid overload, unspecified: Secondary | ICD-10-CM | POA: Diagnosis not present

## 2016-03-22 DIAGNOSIS — L7634 Postprocedural seroma of skin and subcutaneous tissue following other procedure: Secondary | ICD-10-CM | POA: Diagnosis not present

## 2016-03-22 DIAGNOSIS — Z6832 Body mass index (BMI) 32.0-32.9, adult: Secondary | ICD-10-CM | POA: Diagnosis not present

## 2016-03-22 DIAGNOSIS — E210 Primary hyperparathyroidism: Secondary | ICD-10-CM | POA: Diagnosis not present

## 2016-03-22 DIAGNOSIS — N186 End stage renal disease: Secondary | ICD-10-CM | POA: Diagnosis not present

## 2016-03-22 DIAGNOSIS — Z952 Presence of prosthetic heart valve: Secondary | ICD-10-CM

## 2016-03-22 DIAGNOSIS — I739 Peripheral vascular disease, unspecified: Secondary | ICD-10-CM | POA: Diagnosis present

## 2016-03-22 DIAGNOSIS — Z79899 Other long term (current) drug therapy: Secondary | ICD-10-CM

## 2016-03-22 HISTORY — PX: PARATHYROIDECTOMY: SHX19

## 2016-03-22 LAB — RENAL FUNCTION PANEL
Albumin: 3 g/dL — ABNORMAL LOW (ref 3.5–5.0)
Anion gap: 13 (ref 5–15)
BUN: 34 mg/dL — ABNORMAL HIGH (ref 6–20)
CO2: 28 mmol/L (ref 22–32)
Calcium: 8.8 mg/dL — ABNORMAL LOW (ref 8.9–10.3)
Chloride: 97 mmol/L — ABNORMAL LOW (ref 101–111)
Creatinine, Ser: 11.49 mg/dL — ABNORMAL HIGH (ref 0.61–1.24)
GFR calc Af Amer: 5 mL/min — ABNORMAL LOW (ref >60)
GFR calc non Af Amer: 5 mL/min — ABNORMAL LOW (ref >60)
Glucose, Bld: 187 mg/dL — ABNORMAL HIGH (ref 65–99)
Phosphorus: 7.2 mg/dL — ABNORMAL HIGH (ref 2.5–4.6)
Potassium: 5.9 mmol/L — ABNORMAL HIGH (ref 3.5–5.1)
Sodium: 138 mmol/L (ref 135–145)

## 2016-03-22 LAB — POCT I-STAT 4, (NA,K, GLUC, HGB,HCT)
Glucose, Bld: 91 mg/dL (ref 65–99)
HCT: 37 % — ABNORMAL LOW (ref 39.0–52.0)
Hemoglobin: 12.6 g/dL — ABNORMAL LOW (ref 13.0–17.0)
POTASSIUM: 4.2 mmol/L (ref 3.5–5.1)
Sodium: 140 mmol/L (ref 135–145)

## 2016-03-22 SURGERY — PARATHYROIDECTOMY, WITH AUTOGRAFT TRANSPLANT
Anesthesia: General | Site: Neck

## 2016-03-22 MED ORDER — PROPOFOL 10 MG/ML IV BOLUS
INTRAVENOUS | Status: AC
  Filled 2016-03-22: qty 20

## 2016-03-22 MED ORDER — LIDOCAINE 2% (20 MG/ML) 5 ML SYRINGE
INTRAMUSCULAR | Status: DC | PRN
  Administered 2016-03-22: 100 mg via INTRAVENOUS

## 2016-03-22 MED ORDER — HYDROMORPHONE HCL 1 MG/ML IJ SOLN: 0.5000 mg | INTRAMUSCULAR | Status: DC | PRN

## 2016-03-22 MED ORDER — PROPOFOL 10 MG/ML IV BOLUS
INTRAVENOUS | Status: DC | PRN
  Administered 2016-03-22: 100 mg via INTRAVENOUS
  Administered 2016-03-22: 30 mg via INTRAVENOUS
  Administered 2016-03-22: 200 mg via INTRAVENOUS

## 2016-03-22 MED ORDER — GLYCOPYRROLATE 0.2 MG/ML IJ SOLN
INTRAMUSCULAR | Status: DC | PRN
  Administered 2016-03-22: .8 mg via INTRAVENOUS

## 2016-03-22 MED ORDER — PHENYLEPHRINE 40 MCG/ML (10ML) SYRINGE FOR IV PUSH (FOR BLOOD PRESSURE SUPPORT)
PREFILLED_SYRINGE | INTRAVENOUS | Status: AC
  Filled 2016-03-22: qty 20

## 2016-03-22 MED ORDER — LABETALOL HCL 5 MG/ML IV SOLN
INTRAVENOUS | Status: AC
  Filled 2016-03-22: qty 4

## 2016-03-22 MED ORDER — CHLORHEXIDINE GLUCONATE CLOTH 2 % EX PADS: 6.0000 | MEDICATED_PAD | Freq: Once | CUTANEOUS | Status: DC

## 2016-03-22 MED ORDER — PHENYLEPHRINE HCL 10 MG/ML IJ SOLN
INTRAMUSCULAR | Status: DC | PRN
  Administered 2016-03-22: 80 ug via INTRAVENOUS

## 2016-03-22 MED ORDER — OXYCODONE-ACETAMINOPHEN 5-325 MG PO TABS
1.0000 | ORAL_TABLET | Freq: Four times a day (QID) | ORAL | Status: DC | PRN
Start: 2016-03-22 — End: 2016-03-28
  Administered 2016-03-22 – 2016-03-23 (×2): 2 via ORAL
  Administered 2016-03-27 (×2): 1 via ORAL
  Filled 2016-03-22: qty 1
  Filled 2016-03-22 (×2): qty 2

## 2016-03-22 MED ORDER — FENTANYL CITRATE (PF) 100 MCG/2ML IJ SOLN
INTRAMUSCULAR | Status: AC
Start: 2016-03-22 — End: 2016-03-22
  Filled 2016-03-22: qty 2

## 2016-03-22 MED ORDER — SODIUM CHLORIDE 0.9 % IV SOLN
INTRAVENOUS | Status: DC
  Administered 2016-03-22 (×2): via INTRAVENOUS
  Administered 2016-03-22: 25 mL/h via INTRAVENOUS

## 2016-03-22 MED ORDER — MIDAZOLAM HCL 2 MG/2ML IJ SOLN
INTRAMUSCULAR | Status: AC
  Filled 2016-03-22: qty 2

## 2016-03-22 MED ORDER — LABETALOL HCL 5 MG/ML IV SOLN: 5.0000 mg | INTRAVENOUS | Status: DC | PRN

## 2016-03-22 MED ORDER — FENTANYL CITRATE (PF) 100 MCG/2ML IJ SOLN
INTRAMUSCULAR | Status: DC | PRN
  Administered 2016-03-22: 50 ug via INTRAVENOUS
  Administered 2016-03-22 (×2): 100 ug via INTRAVENOUS
  Administered 2016-03-22: 50 ug via INTRAVENOUS

## 2016-03-22 MED ORDER — 0.9 % SODIUM CHLORIDE (POUR BTL) OPTIME
TOPICAL | Status: DC | PRN
  Administered 2016-03-22 (×2): 1000 mL

## 2016-03-22 MED ORDER — FENTANYL CITRATE (PF) 100 MCG/2ML IJ SOLN
INTRAMUSCULAR | Status: AC
  Filled 2016-03-22: qty 2

## 2016-03-22 MED ORDER — SODIUM CHLORIDE 0.9 % IV SOLN
62.5000 mg | INTRAVENOUS | Status: DC
  Administered 2016-03-26: 62.5 mg via INTRAVENOUS
  Filled 2016-03-22 (×2): qty 5

## 2016-03-22 MED ORDER — CALCITRIOL 0.5 MCG PO CAPS
1.0000 ug | ORAL_CAPSULE | Freq: Every day | ORAL | Status: DC
  Administered 2016-03-22: 1 ug via ORAL
  Filled 2016-03-22: qty 2

## 2016-03-22 MED ORDER — PHENYLEPHRINE HCL 10 MG/ML IJ SOLN
INTRAVENOUS | Status: DC | PRN
  Administered 2016-03-22: 20 ug/min via INTRAVENOUS

## 2016-03-22 MED ORDER — ONDANSETRON HCL 4 MG/2ML IJ SOLN
INTRAMUSCULAR | Status: AC
  Filled 2016-03-22: qty 2

## 2016-03-22 MED ORDER — ONDANSETRON HCL 4 MG/2ML IJ SOLN
INTRAMUSCULAR | Status: AC
  Filled 2016-03-22: qty 4

## 2016-03-22 MED ORDER — ROCURONIUM BROMIDE 100 MG/10ML IV SOLN
INTRAVENOUS | Status: DC | PRN
  Administered 2016-03-22: 30 mg via INTRAVENOUS
  Administered 2016-03-22: 20 mg via INTRAVENOUS

## 2016-03-22 MED ORDER — HYDROMORPHONE HCL 1 MG/ML IJ SOLN
1.0000 mg | INTRAMUSCULAR | Status: DC | PRN
  Administered 2016-03-22: 2 mg via INTRAVENOUS
  Filled 2016-03-22 (×2): qty 2

## 2016-03-22 MED ORDER — ONDANSETRON HCL 4 MG/2ML IJ SOLN
INTRAMUSCULAR | Status: DC | PRN
  Administered 2016-03-22: 4 mg via INTRAVENOUS

## 2016-03-22 MED ORDER — HEMOSTATIC AGENTS (NO CHARGE) OPTIME
TOPICAL | Status: DC | PRN
  Administered 2016-03-22: 1 via TOPICAL

## 2016-03-22 MED ORDER — LISINOPRIL 20 MG PO TABS
20.0000 mg | ORAL_TABLET | Freq: Every day | ORAL | Status: DC
  Administered 2016-03-22 – 2016-03-25 (×4): 20 mg via ORAL
  Filled 2016-03-22 (×4): qty 1

## 2016-03-22 MED ORDER — FENTANYL CITRATE (PF) 100 MCG/2ML IJ SOLN
25.0000 ug | INTRAMUSCULAR | Status: DC | PRN
  Administered 2016-03-22 (×2): 25 ug via INTRAVENOUS

## 2016-03-22 MED ORDER — ACETAMINOPHEN 650 MG RE SUPP
650.0000 mg | Freq: Four times a day (QID) | RECTAL | Status: DC | PRN
Start: 2016-03-22 — End: 2016-03-28

## 2016-03-22 MED ORDER — CALCIUM CARBONATE ANTACID 500 MG PO CHEW
2.0000 | CHEWABLE_TABLET | Freq: Three times a day (TID) | ORAL | Status: DC
  Filled 2016-03-22: qty 2

## 2016-03-22 MED ORDER — ACETAMINOPHEN 325 MG PO TABS
650.0000 mg | ORAL_TABLET | Freq: Four times a day (QID) | ORAL | Status: DC | PRN
  Administered 2016-03-25: 650 mg via ORAL
  Filled 2016-03-22: qty 2

## 2016-03-22 MED ORDER — SODIUM CHLORIDE 0.9 % IV SOLN: INTRAVENOUS | Status: DC

## 2016-03-22 MED ORDER — NEOSTIGMINE METHYLSULFATE 10 MG/10ML IV SOLN
INTRAVENOUS | Status: DC | PRN
  Administered 2016-03-22: 5 mg via INTRAVENOUS

## 2016-03-22 MED ORDER — IBUPROFEN 400 MG PO TABS
400.0000 mg | ORAL_TABLET | ORAL | Status: DC
  Administered 2016-03-23 – 2016-03-28 (×6): 400 mg via ORAL
  Filled 2016-03-22 (×6): qty 1

## 2016-03-22 SURGICAL SUPPLY — 58 items
ATTRACTOMAT 16X20 MAGNETIC DRP (DRAPES) ×2 IMPLANT
BANDAGE ACE 4X5 VEL STRL LF (GAUZE/BANDAGES/DRESSINGS) ×2 IMPLANT
BENZOIN TINCTURE PRP APPL 2/3 (GAUZE/BANDAGES/DRESSINGS) ×4 IMPLANT
BLADE SURG 10 STRL SS (BLADE) ×2 IMPLANT
BLADE SURG 15 STRL LF DISP TIS (BLADE) ×3 IMPLANT
BLADE SURG 15 STRL SS (BLADE) ×3
BLADE SURG ROTATE 9660 (MISCELLANEOUS) IMPLANT
BNDG GAUZE ELAST 4 BULKY (GAUZE/BANDAGES/DRESSINGS) ×2 IMPLANT
CANISTER SUCTION 2500CC (MISCELLANEOUS) ×2 IMPLANT
CHLORAPREP W/TINT 10.5 ML (MISCELLANEOUS) ×4 IMPLANT
CLIP TI MEDIUM 24 (CLIP) ×2 IMPLANT
CLIP TI WIDE RED SMALL 24 (CLIP) ×2 IMPLANT
CONT SPEC 4OZ CLIKSEAL STRL BL (MISCELLANEOUS) ×18 IMPLANT
COVER SURGICAL LIGHT HANDLE (MISCELLANEOUS) ×2 IMPLANT
CRADLE DONUT ADULT HEAD (MISCELLANEOUS) ×2 IMPLANT
DRAPE LAPAROTOMY T 98X78 PEDS (DRAPES) ×4 IMPLANT
DRAPE SLUSH MACHINE 52X66 (DRAPES) ×2 IMPLANT
DRAPE SLUSH/WARMER DISC (DRAPES) IMPLANT
DRAPE UTILITY XL STRL (DRAPES) ×8 IMPLANT
DRSG TEGADERM 4X4.75 (GAUZE/BANDAGES/DRESSINGS) ×2 IMPLANT
ELECT CAUTERY BLADE 6.4 (BLADE) ×2 IMPLANT
ELECT REM PT RETURN 9FT ADLT (ELECTROSURGICAL) ×2
ELECTRODE REM PT RTRN 9FT ADLT (ELECTROSURGICAL) ×1 IMPLANT
GAUZE SPONGE 4X4 12PLY STRL (GAUZE/BANDAGES/DRESSINGS) ×2 IMPLANT
GAUZE SPONGE 4X4 16PLY XRAY LF (GAUZE/BANDAGES/DRESSINGS) ×2 IMPLANT
GLOVE BIO SURGEON STRL SZ 6 (GLOVE) ×4 IMPLANT
GLOVE BIO SURGEON STRL SZ7 (GLOVE) ×4 IMPLANT
GLOVE BIOGEL PI IND STRL 6.5 (GLOVE) ×2 IMPLANT
GLOVE BIOGEL PI INDICATOR 6.5 (GLOVE) ×2
GLOVE SURG ORTHO 8.0 STRL STRW (GLOVE) ×4 IMPLANT
GOWN STRL REUS W/ TWL LRG LVL3 (GOWN DISPOSABLE) ×3 IMPLANT
GOWN STRL REUS W/ TWL XL LVL3 (GOWN DISPOSABLE) ×3 IMPLANT
GOWN STRL REUS W/TWL LRG LVL3 (GOWN DISPOSABLE) ×3
GOWN STRL REUS W/TWL XL LVL3 (GOWN DISPOSABLE) ×3
HEMOSTAT SURGICEL 2X4 FIBR (HEMOSTASIS) ×2 IMPLANT
IV SODIUM CHL 0.9 SLUSH (IV SOLUTION) ×2 IMPLANT
KIT BASIN OR (CUSTOM PROCEDURE TRAY) ×2 IMPLANT
KIT ROOM TURNOVER OR (KITS) ×2 IMPLANT
NS IRRIG 1000ML POUR BTL (IV SOLUTION) ×2 IMPLANT
PACK SURGICAL SETUP 50X90 (CUSTOM PROCEDURE TRAY) ×2 IMPLANT
PAD ARMBOARD 7.5X6 YLW CONV (MISCELLANEOUS) ×2 IMPLANT
PENCIL BUTTON HOLSTER BLD 10FT (ELECTRODE) ×2 IMPLANT
SPECIMEN JAR SMALL (MISCELLANEOUS) IMPLANT
SPONGE GAUZE 4X4 12PLY STER LF (GAUZE/BANDAGES/DRESSINGS) ×4 IMPLANT
SPONGE INTESTINAL PEANUT (DISPOSABLE) ×2 IMPLANT
STRIP CLOSURE SKIN 1/2X4 (GAUZE/BANDAGES/DRESSINGS) ×4 IMPLANT
SUT MNCRL AB 4-0 PS2 18 (SUTURE) ×4 IMPLANT
SUT PROLENE 4 0 RB 1 (SUTURE) ×1
SUT PROLENE 4-0 RB1 .5 CRCL 36 (SUTURE) ×1 IMPLANT
SUT SILK 2 0 (SUTURE) ×1
SUT SILK 2-0 18XBRD TIE 12 (SUTURE) ×1 IMPLANT
SUT VIC AB 3-0 SH 18 (SUTURE) ×4 IMPLANT
SYR BULB 3OZ (MISCELLANEOUS) ×2 IMPLANT
TAPE CLOTH SURG 4X10 WHT LF (GAUZE/BANDAGES/DRESSINGS) ×2 IMPLANT
TOWEL OR 17X24 6PK STRL BLUE (TOWEL DISPOSABLE) IMPLANT
TOWEL OR 17X26 10 PK STRL BLUE (TOWEL DISPOSABLE) ×2 IMPLANT
TUBE CONNECTING 12X1/4 (SUCTIONS) ×2 IMPLANT
UNDERPAD 30X30 (UNDERPADS AND DIAPERS) ×2 IMPLANT

## 2016-03-22 NOTE — Progress Notes (Signed)
Received order from Dr. Redmond School, that I could acess diatek for blood sample and to hook up IVF.  Cath acessed, 10 ccs blood removed from venous port and fluids hooked up.  Flows well.

## 2016-03-22 NOTE — Interval H&P Note (Signed)
History and Physical Interval Note:  03/22/2016 8:41 AM  Timothy Palmer  has presented today for surgery, with the diagnosis of Secondary hyperparathyroidism.  The various methods of treatment have been discussed with the patient and family. After consideration of risks, benefits and other options for treatment, the patient has consented to    Procedure(s): PARATHYROIDECTOMY AUTOTRANSPLANT (N/A) as a surgical intervention .    The patient's history has been reviewed, patient examined, no change in status, stable for surgery.  I have reviewed the patient's chart and labs.  Questions were answered to the patient's satisfaction.    [name], MD, [facility] Office: [place]

## 2016-03-22 NOTE — Consult Note (Signed)
[name] Renal Consultation Note    Indication for Consultation:  Management of ESRD/hemodialysis; anemia, hypertension/volume and secondary hyperparathyroidism  HPI: Timothy Palmer is a 50 y.o. male with ESRD,HTN,  hx MSSA bacterial endocarditis with septicembolic with subsequent mitral valve repair 2015, severe secondary secondary hyperparathyroidism (last iPTH 3100 august 2017) is admitted following parathyroidectomy.  Dr. Harlow Asa found two large (3-4 cm glands) one on each side and autotransplanted one into arm. Post surgery labs are pending. Pre op K and hgb were ok.  There was not a pre op calcium, though he has been prone to hypercalcemia prohibiting the use of VDRAs. He has no complaints except sore throat and is worried that edw is too low because he weighed 99.9 pre op today.  Past Medical History:  Diagnosis Date  . Acute combined systolic and diastolic congestive heart failure ([place]) 03/21/2014  . 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 Chronic diastolic congestive heart failure ([place])   . DVT of upper extremity (deep vein thrombosis) ([place]) 03/04/2014   Right arm  . ESRD (end stage renal disease) on dialysis ([place])    [place],Dialysis- T,Th,S  . Hypertension   . 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 The aneurysm measures 1.4 x 1.1 cm which is  significantly larger than the 4.5 mm parent vessel.   . Peripheral vascular disease ([place])   . Pneumonia 2014  . 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 S/P minimally invasive mitral valve repair 04/05/2014   Complex valvuloplasty including autologous pericardial patch repair of large perforation of posterior  leaflet due to endocarditis with 28 mm Sorin Memo 3D ring annuloplasty via right mini thoracotomy approach  . Septic embolism to left lower extremity 03/25/2014  . Severe mitral regurgitation 03/23/2014   by TEE  . Shortness of breath dyspnea    with exertion  . 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 AV FISTULA PLACEMENT Left ?2005   forearm   . BASCILIC VEIN TRANSPOSITION Left 12/27/2015   Procedure: FIRST STAGE BASILIC VEIN TRANSPOSITION;  Surgeon: Conrad Atalissa, MD;  Location: [place];  Service: Vascular;  Laterality: Left;  . BASCILIC VEIN TRANSPOSITION Left 02/12/2016   Procedure: SECOND STAGE BASILIC VEIN TRANSPOSITION;  Surgeon: Conrad Menifee, MD;  Location: [place];  Service: Vascular;  Laterality: Left;  . EMBOLECTOMY Left 03/25/2014   Procedure: EMBOLECTOMY left popliteal;  Surgeon: Rosetta Posner, MD;  Location: [place];  Service: Vascular;  Laterality: Left;  . FEMORAL-POPLITEAL BYPASS GRAFT Left 03/25/2014   Procedure: Left Femoral- Below Knee Popliteal Bypass Graft;  Surgeon: Rosetta Posner, MD;  Location: [place];  Service: Vascular;  Laterality: Left;  . INTRAOPERATIVE TRANSESOPHAGEAL ECHOCARDIOGRAM N/A 04/05/2014   Procedure: INTRAOPERATIVE TRANSESOPHAGEAL ECHOCARDIOGRAM;  Surgeon: Rexene Alberts, MD;  Location: [place];  Service: Open Heart Surgery;  Laterality: N/A;  . LEFT AND RIGHT HEART CATHETERIZATION WITH CORONARY ANGIOGRAM N/A 03/28/2014   Procedure: LEFT AND RIGHT HEART CATHETERIZATION WITH CORONARY ANGIOGRAM;  Surgeon: Larey Dresser, MD;  Location: [facility] CATH LAB;  Service: Cardiovascular;  Laterality: N/A;  . MITRAL VALVE REPAIR Right 04/05/2014   Procedure: MINIMALLY INVASIVE MITRAL VALVE REPAIR (MVR);  Surgeon: Rexene Alberts, MD;  Location: [place];  Service: Open Heart Surgery;  Laterality: Right;  . MITRAL VALVE REPLACEMENT Right 04/05/2014   Procedure: Bring back MINIMALLY INVASIVE MITRAL VALVE (MV) REPLACEMENT - Reexploration for  bleeding;  Surgeon: Rexene Alberts, MD;  Location: [place];  Service: Open Heart Surgery;  Laterality: Right;  . PATCH ANGIOPLASTY Left 07/23/2013   Procedure: PATCH ANGIOPLASTY- LEFT RADIOCEPHALIC ARTERIOVENOUS FISTULA;  Surgeon: Angelia Mould, MD;  Location: [place];  Service: Vascular;  Laterality: Left;  . SHUNTOGRAM Left November 04, 2011  . TEE WITHOUT CARDIOVERSION N/A 03/24/2014   Procedure: TRANSESOPHAGEAL ECHOCARDIOGRAM (TEE);  Surgeon: Larey Dresser, MD;  Location: [place];  Service: Cardiovascular;  Laterality: N/A;  . TEE WITHOUT CARDIOVERSION N/A 04/12/2015   Procedure: TRANSESOPHAGEAL ECHOCARDIOGRAM (TEE);  Surgeon: Larey Dresser, MD;  Location: [place];  Service: Cardiovascular;  Laterality: N/A;  . TEE WITHOUT CARDIOVERSION N/A 08/18/2015   Procedure: TRANSESOPHAGEAL ECHOCARDIOGRAM (TEE);  Surgeon: Larey Dresser, MD;  Location: [facility] ENDOSCOPY;  Service: Cardiovascular;  Laterality: N/A;   Family History  Problem Relation Age of Onset  . Diabetes Mother   . Hypertension Mother   . Hypertension Father   . Hypertension Sister   . Hypertension Brother    Social History:  reports that he has never smoked. He has never used smokeless tobacco. He reports that he drinks alcohol. He reports that he uses drugs, including Marijuana, about 7 times per week. Allergies  Allergen Reactions  . No Known Allergies    Prior to Admission medications   Medication Sig Start Date End Date Taking? Authorizing Provider  aspirin 81 MG tablet Take 81 mg by mouth daily.   Yes Historical Provider, MD  lisinopril (PRINIVIL,ZESTRIL) 20 MG tablet Take 20 mg by mouth daily.   Yes Historical Provider, MD  multivitamin (RENA-VIT) TABS tablet Take 1 tablet by mouth at bedtime. Patient taking differently: Take 1 tablet by mouth daily.  04/14/15  Yes Modena Jansky, MD  Nutritional Supplements (FEEDING SUPPLEMENT, NEPRO CARB STEADY,) LIQD Take 237 mLs by mouth 2 (two) times daily between  meals. Patient taking differently: Take 237 mLs by mouth daily.  04/14/15  Yes Modena Jansky, MD  sevelamer carbonate (RENVELA) 800 MG tablet Take 4,000 mg by mouth 3 (three) times daily with meals.    Yes Historical Provider, MD  ibuprofen (ADVIL,MOTRIN) 200 MG tablet Take 400 mg by mouth every morning.     Historical Provider, MD  oxyCODONE-acetaminophen (ROXICET) 5-325 MG tablet Take 1-2 tablets by mouth every 6 (six) hours as needed for moderate pain. 02/12/16   Conrad Linwood, MD   Current Facility-Administered Medications  Medication Dose Route Frequency Provider Last Rate Last Dose  . 0.9 %  sodium chloride infusion   Intravenous Continuous Kate Sable, MD 25 mL/hr at 03/22/16 0755    . 0.9 %  sodium chloride infusion   Intravenous Continuous Armandina Gemma, MD      . acetaminophen (TYLENOL) tablet 650 mg  650 mg Oral Q6H PRN Armandina Gemma, MD       Or  . acetaminophen (TYLENOL) suppository 650 mg  650 mg Rectal Q6H PRN Armandina Gemma, MD      . calcitRIOL (ROCALTROL) capsule 1 mcg  1 mcg Oral Daily Alric Seton, PA-C      . calcium carbonate (TUMS - dosed in mg elemental calcium) chewable tablet 400 mg of elemental calcium  2 tablet Oral TID WC Alric Seton, PA-C      .  fentaNYL (SUBLIMAZE) 100 MCG/2ML injection           . [START ON 03/26/2016] ferric gluconate (NULECIT) 62.5 mg in sodium chloride 0.9 % 100 mL IVPB  62.5 mg Intravenous Q Tue-HD Alric Seton, PA-C      . HYDROmorphone (DILAUDID) injection 1-2 mg  1-2 mg Intravenous Q2H PRN Armandina Gemma, MD   2 mg at 03/22/16 1350  . [START ON 03/23/2016] ibuprofen (ADVIL,MOTRIN) tablet 400 mg  400 mg Oral Genia Del, MD      . labetalol (NORMODYNE,TRANDATE) 5 MG/ML injection           . lisinopril (PRINIVIL,ZESTRIL) tablet 20 mg  20 mg Oral Daily Armandina Gemma, MD      . oxyCODONE-acetaminophen (PERCOCET/ROXICET) 5-325 MG per tablet 1-2 tablet  1-2 tablet Oral Q6H PRN Armandina Gemma, MD       Labs: Basic Metabolic Panel:  Recent  Labs Lab 03/22/16 0758  NA 140  K 4.2  GLUCOSE 91     Recent Labs Lab 03/22/16 0758  HGB 12.6*  HCT 37.0*    ROS: As per HPI otherwise negative.  Physical Exam: Vitals:   03/22/16 1245 03/22/16 1300 03/22/16 1315 03/22/16 1347  BP:   (!) 153/86 (!) 156/109  Pulse: 68 69 72 70  Resp: (!) 9 (!) 8 12 16   Temp:    98.3 F (36.8 C)  TempSrc:    Oral  SpO2: 98% 97% 99% 94%  Weight:      Height:         General: WDWN NAD Head: NCAT sclera not icteric MMM Neck: dressing intact Lungs: CTA bilaterally without wheezes, rales, or rhonchi. Breathing is unlabored. Heart: RRR with S1 S2.  Abdomen: soft NT + BS Extremities:without  LE edema (SCDs on) right forearm ace over transplanted gland Neuro: A & O  X 3. Moves all extremities spontaneously. Psych:  Responds to questions appropriately with a normal affect. Dialysis Access: right IJ and left AVF maturing arm still moderately swollen but stable  Dialysis Orders: [facility] [place] 4 hr EDW just decreased from 103 to 102 due to HTN heparin 5000 right IJ and right AVF 400/800 venofer 50 per week - no VDRA due to high Ca no Mircera (last dose was 150 on 8/31 and 8/17 Recent labs:  hgb 11.7 24% sat iPTH 3194 8/24 - on renvela 3 sensipar 90 bid; lisinopril 20 bid (per med list but he is taking only 20)and amlodpine 10 added 9/7 but not picked up Rx yet  Assessment/Plan: 1.  s/p parathyroidectomy/MBD - 1 large gland on right and left each 3-4 cm- one autotransplanted- iPTHs have been in the 3000 range with hypercalcemia prior to admission - awaiting first post op Ca - will follow Ca - start empiric calcitriol/tums -will need to adjust as Ca levels dictate - have ordered bid levels; stop renvela and sensipar 2.  ESRD -  TTS HD Saturday - hold heparin - previously on 2 Ca bath - start on 2.5 Ca bath - anticipate drop in Ca post parathyroidectomy 3.  Hypertension/volume  -recent BP high - norvasc added to lisinopril but he didn't pick up - titrate  edw as much as able before increasing meds (post HD wt 102.5 Thursday with post BP 170/100s) 4.  Anemia  -weekly Fe - hgb may drop off Mircera - received 2 doses 150 in August with hgb is the 11s - watch - would resume while here if it drops < 11 5.  Nutrition - CL advance to renal as tolerated  Myriam Jacobson, PA-C [place] [phone] 03/22/2016, 2:13 PM

## 2016-03-22 NOTE — Anesthesia Postprocedure Evaluation (Signed)
Anesthesia Post Note  Patient: Timothy Palmer  Procedure(s) Performed: Procedure(s) (LRB): PARATHYROIDECTOMY AUTOTRANSPLANT (N/A)  Patient location during evaluation: PACU Anesthesia Type: General Level of consciousness: sedated, oriented, patient cooperative and awake Pain management: pain level controlled Vital Signs Assessment: post-procedure vital signs reviewed and stable Respiratory status: spontaneous breathing and respiratory function stable Cardiovascular status: stable Anesthetic complications: no    Last Vitals:  Vitals:   03/22/16 0724 03/22/16 1225  BP: (!) 167/100   Pulse: 89   Resp: 18   Temp: 36.9 C 36.7 C    Last Pain:  Vitals:   03/22/16 0724  TempSrc: (P) Oral                 Hula Tasso EDWARD

## 2016-03-22 NOTE — Brief Op Note (Signed)
03/22/2016  12:27 PM  PATIENT:  Joyice Faster Rentz  50 y.o. male  PRE-OPERATIVE DIAGNOSIS:  Secondary hyperparathyroidism  POST-OPERATIVE DIAGNOSIS:  Secondary hyperparathyroidism  PROCEDURE:  Procedure(s): PARATHYROIDECTOMY AUTOTRANSPLANT (N/A)  SURGEON:  Surgeon(s) and Role:    * Armandina Gemma, MD - Primary    * Clovis Riley, MD - Assisting  ANESTHESIA:   general  EBL:  Total I/O In: 525 [I.V.:525] Out: 35 [Blood:35]  BLOOD ADMINISTERED:none  DRAINS: none   LOCAL MEDICATIONS USED:  NONE  SPECIMEN:  Excision  DISPOSITION OF SPECIMEN:  PATHOLOGY  COUNTS:  YES  TOURNIQUET:  * No tourniquets in log *  DICTATION: .Other Dictation: Dictation Number [id number]  PLAN OF CARE: Admit to inpatient   PATIENT DISPOSITION:  PACU - hemodynamically stable.   Delay start of Pharmacological VTE agent (>24hrs) due to surgical blood loss or risk of bleeding: yes  [name], MD, [facility] Office: [phone]

## 2016-03-22 NOTE — Anesthesia Preprocedure Evaluation (Signed)
Anesthesia Evaluation  Patient identified by MRN, date of birth, ID band Patient awake    Reviewed: Allergy & Precautions, NPO status , Patient's Chart, lab work & pertinent test results  Airway Mallampati: I       Dental   Pulmonary shortness of breath,    Pulmonary exam normal        Cardiovascular hypertension, + Peripheral Vascular Disease and +CHF  Normal cardiovascular exam+ Valvular Problems/Murmurs      Neuro/Psych    GI/Hepatic   Endo/Other    Renal/GU ESRF and DialysisRenal disease     Musculoskeletal   Abdominal   Peds  Hematology  (+) anemia ,   Anesthesia Other Findings   Reproductive/Obstetrics                             Anesthesia Physical Anesthesia Plan  ASA: III  Anesthesia Plan: General   Post-op Pain Management:    Induction: Intravenous  Airway Management Planned: Oral ETT  Additional Equipment:   Intra-op Plan:   Post-operative Plan: Extubation in OR  Informed Consent: I have reviewed the patients History and Physical, chart, labs and discussed the procedure including the risks, benefits and alternatives for the proposed anesthesia with the patient or authorized representative who has indicated his/her understanding and acceptance.     Plan Discussed with: Anesthesiologist, CRNA and Surgeon  Anesthesia Plan Comments:         Anesthesia Quick Evaluation

## 2016-03-22 NOTE — Transfer of Care (Signed)
Immediate Anesthesia Transfer of Care Note  Patient: Timothy Palmer  Procedure(s) Performed: Procedure(s): PARATHYROIDECTOMY AUTOTRANSPLANT (N/A)  Patient Location: PACU  Anesthesia Type:General  Level of Consciousness: awake, alert  and oriented  Airway & Oxygen Therapy: Patient Spontanous Breathing and Patient connected to nasal cannula oxygen  Post-op Assessment: Report given to RN, Post -op Vital signs reviewed and stable and Patient moving all extremities  Post vital signs: Reviewed and stable  Last Vitals:  Vitals:   03/22/16 0724 03/22/16 1225  BP: (!) 167/100   Pulse: 89   Resp: 18   Temp: 36.9 C 36.7 C    Last Pain:  Vitals:   03/22/16 0724  TempSrc: (P) Oral         Complications: No apparent anesthesia complications

## 2016-03-22 NOTE — Anesthesia Procedure Notes (Addendum)
Procedure Name: Intubation Date/Time: 03/22/2016 9:06 AM Performed by: Trixie Deis A Pre-anesthesia Checklist: Patient identified, Emergency Drugs available, Suction available and Patient being monitored Patient Re-evaluated:Patient Re-evaluated prior to inductionOxygen Delivery Method: Circle System Utilized Preoxygenation: Pre-oxygenation with 100% oxygen Intubation Type: IV induction Ventilation: Mask ventilation without difficulty and Oral airway inserted - appropriate to patient size Laryngoscope Size: Mac and 4 Grade View: Grade I Tube type: Oral Tube size: 7.5 mm Number of attempts: 1 Airway Equipment and Method: Stylet and Oral airway Placement Confirmation: ETT inserted through vocal cords under direct vision,  positive ETCO2 and breath sounds checked- equal and bilateral Secured at: 23 cm Tube secured with: Tape Dental Injury: Teeth and Oropharynx as per pre-operative assessment

## 2016-03-23 DIAGNOSIS — N186 End stage renal disease: Secondary | ICD-10-CM | POA: Diagnosis not present

## 2016-03-23 DIAGNOSIS — E8770 Fluid overload, unspecified: Secondary | ICD-10-CM | POA: Diagnosis not present

## 2016-03-23 DIAGNOSIS — Z992 Dependence on renal dialysis: Secondary | ICD-10-CM | POA: Diagnosis not present

## 2016-03-23 DIAGNOSIS — I120 Hypertensive chronic kidney disease with stage 5 chronic kidney disease or end stage renal disease: Secondary | ICD-10-CM | POA: Diagnosis not present

## 2016-03-23 DIAGNOSIS — D631 Anemia in chronic kidney disease: Secondary | ICD-10-CM | POA: Diagnosis not present

## 2016-03-23 LAB — RENAL FUNCTION PANEL
Albumin: 2.9 g/dL — ABNORMAL LOW (ref 3.5–5.0)
Albumin: 2.9 g/dL — ABNORMAL LOW (ref 3.5–5.0)
Anion gap: 17 — ABNORMAL HIGH (ref 5–15)
Anion gap: 11 (ref 5–15)
BUN: 43 mg/dL — ABNORMAL HIGH (ref 6–20)
BUN: 20 mg/dL (ref 6–20)
CO2: 25 mmol/L (ref 22–32)
CO2: 28 mmol/L (ref 22–32)
Calcium: 7.4 mg/dL — ABNORMAL LOW (ref 8.9–10.3)
Calcium: 7.8 mg/dL — ABNORMAL LOW (ref 8.9–10.3)
Chloride: 95 mmol/L — ABNORMAL LOW (ref 101–111)
Chloride: 96 mmol/L — ABNORMAL LOW (ref 101–111)
Creatinine, Ser: 13.09 mg/dL — ABNORMAL HIGH (ref 0.61–1.24)
Creatinine, Ser: 8.11 mg/dL — ABNORMAL HIGH (ref 0.61–1.24)
GFR calc Af Amer: 5 mL/min — ABNORMAL LOW (ref >60)
GFR calc Af Amer: 8 mL/min — ABNORMAL LOW (ref >60)
GFR calc non Af Amer: 4 mL/min — ABNORMAL LOW (ref >60)
GFR calc non Af Amer: 7 mL/min — ABNORMAL LOW (ref >60)
Glucose, Bld: 93 mg/dL (ref 65–99)
Glucose, Bld: 94 mg/dL (ref 65–99)
Phosphorus: 6.1 mg/dL — ABNORMAL HIGH (ref 2.5–4.6)
Phosphorus: 4.7 mg/dL — ABNORMAL HIGH (ref 2.5–4.6)
Potassium: 6.4 mmol/L (ref 3.5–5.1)
Potassium: 4.6 mmol/L (ref 3.5–5.1)
Sodium: 137 mmol/L (ref 135–145)
Sodium: 135 mmol/L (ref 135–145)

## 2016-03-23 LAB — CBC
HCT: 36.4 % — ABNORMAL LOW (ref 39.0–52.0)
Hemoglobin: 11.7 g/dL — ABNORMAL LOW (ref 13.0–17.0)
MCH: 30.2 pg (ref 26.0–34.0)
MCHC: 32.1 g/dL (ref 30.0–36.0)
MCV: 94.1 fL (ref 78.0–100.0)
Platelets: 169 10*3/uL (ref 150–400)
RBC: 3.87 MIL/uL — ABNORMAL LOW (ref 4.22–5.81)
RDW: 18.4 % — ABNORMAL HIGH (ref 11.5–15.5)
WBC: 7.6 10*3/uL (ref 4.0–10.5)

## 2016-03-23 LAB — MRSA PCR SCREENING: MRSA by PCR: NEGATIVE

## 2016-03-23 MED ORDER — SODIUM CHLORIDE 0.9 % IV SOLN: 100.0000 mL | INTRAVENOUS | Status: DC | PRN

## 2016-03-23 MED ORDER — CALCITRIOL 0.5 MCG PO CAPS
1.0000 ug | ORAL_CAPSULE | Freq: Three times a day (TID) | ORAL | Status: DC
  Administered 2016-03-23 (×2): 1 ug via ORAL
  Filled 2016-03-23 (×2): qty 2

## 2016-03-23 MED ORDER — OXYCODONE-ACETAMINOPHEN 5-325 MG PO TABS
ORAL_TABLET | ORAL | Status: AC
  Administered 2016-03-27: 1 via ORAL
  Filled 2016-03-23: qty 2

## 2016-03-23 MED ORDER — CALCIUM CARBONATE ANTACID 500 MG PO CHEW
800.0000 mg | CHEWABLE_TABLET | Freq: Three times a day (TID) | ORAL | Status: DC
  Filled 2016-03-23: qty 4

## 2016-03-23 MED ORDER — LIDOCAINE HCL (PF) 1 % IJ SOLN: 5.0000 mL | INTRAMUSCULAR | Status: DC | PRN

## 2016-03-23 MED ORDER — PENTAFLUOROPROP-TETRAFLUOROETH EX AERO: 1.0000 "application " | INHALATION_SPRAY | CUTANEOUS | Status: DC | PRN

## 2016-03-23 MED ORDER — LIDOCAINE-PRILOCAINE 2.5-2.5 % EX CREA: 1.0000 "application " | TOPICAL_CREAM | CUTANEOUS | Status: DC | PRN

## 2016-03-23 MED ORDER — CALCIUM CARBONATE ANTACID 500 MG PO CHEW
800.0000 mg | CHEWABLE_TABLET | Freq: Three times a day (TID) | ORAL | Status: DC
  Administered 2016-03-24 – 2016-03-26 (×5): 800 mg via ORAL
  Filled 2016-03-23 (×7): qty 4

## 2016-03-23 MED ORDER — HEPARIN SODIUM (PORCINE) 1000 UNIT/ML DIALYSIS: 1000.0000 [IU] | INTRAMUSCULAR | Status: DC | PRN

## 2016-03-23 MED ORDER — ALTEPLASE 2 MG IJ SOLR: 2.0000 mg | Freq: Once | INTRAMUSCULAR | Status: DC | PRN

## 2016-03-23 NOTE — Op Note (Signed)
Timothy Palmer, SCHLABACH NO.:  [id number]  MEDICAL RECORD NO.:  [id number]  LOCATION:  [id number]                        FACILITY:  [facility]  PHYSICIAN:  [name], MD      DATE OF BIRTH:  [birth year]  DATE OF PROCEDURE:  03/22/2016                              OPERATIVE REPORT   PREOPERATIVE DIAGNOSES:  Secondary hyperparathyroidism, end-stage renal disease.  POSTOPERATIVE DIAGNOSES:  Secondary hyperparathyroidism, end-stage renal disease.  PROCEDURE: 1. Neck exploration with parathyroidectomy (2 glands). 2. Autotransplantation parathyroid tissue, right brachioradialis     muscle.  SURGEON:  [name], MD.  ASSISTANT:  [name], MD  ANESTHESIA:  General per Dr. [name].  ESTIMATED BLOOD LOSS:  Minimal.  PREPARATION:  ChloraPrep.  COMPLICATIONS:  None.  INDICATIONS:  The patient is a 50 year old male referred by Dr. [name] for surgical management of secondary hyperparathyroidism.  The patient has been on hemodialysis for 13 years.  End-stage renal disease is due to hypertension.  The patient had failed treatment with Sensipar. Intact PTH level was markedly elevated at 2712.  Calcium level was elevated at 11.1.  Phosphorus level was elevated at 7.5.  The patient now comes to Surgery for neck exploration and parathyroidectomy.  BODY OF REPORT:  Procedure was done in OR #2 at the [place].  The patient was brought to the operating room and placed in supine position on the operating room table.  Following administration of general anesthesia, the patient was positioned and then prepped and draped in the usual aseptic fashion.  After ascertaining that an adequate level of anesthesia had been achieved, a Kocher incision was made with a #15 blade.  Dissection was carried through subcutaneous tissues and platysma.  Hemostasis was achieved with electrocautery.  Skin flaps were developed cephalad and caudad from  the thyroid notch to the sternal notch.  A Mahorner self-retaining retractor was placed for exposure.  Strap muscles were incised in the midline. Dissection was begun on the left side of the neck.  Strap muscles were reflected laterally exposing the left thyroid lobe.  Left lobe was gently mobilized with blunt dissection.  Middle thyroid vein was divided between medium Ligaclips.  Immediately posterior to the middle thyroid vein was a markedly enlarged parathyroid gland measuring approximately 3 cm in greatest diameter.  It was gently dissected out.  Vascular structures were divided between small and medium Ligaclips.  The entire gland was excised.  Biopsy of the gland was taken and submitted to Pathology where Dr. [name] confirmed parathyroid tissue.  Further dissection in the left neck failed to reveal an additional parathyroid gland.  Neck was explored from the superior pole to the inferior pole of the thyroid.  Inferior thyroid artery was dissected out to the carotid artery.  Carotid sheath was opened cephalad and caudad. Dissection was carried posteriorly to the precervical fascia.  The lateral edge of the hypopharynx was identified.  The lateral edge of the esophagus was identified.  Thyrothymic tract was open and there was no evidence of enlarged parathyroid gland.  Dry pack was placed in the left neck.  Next, we reflected the strap muscles to the  right exposing the right thyroid lobe.  Right lobe was gently mobilized.  Again upon elevation of the right thyroid lobe was a markedly enlarged parathyroid gland just below the level of the inferior thyroid artery.  This was dissected out. Vascular structures were divided between Ligaclips and the gland was excised.  This gland was slightly larger than the gland on the left. Biopsy was again submitted and confirmed parathyroid tissue.  Remainder of the gland was placed in iced saline on the back table.  Further dissection on  the right side again is unrevealing of an additional parathyroid gland.  Again, the superior pole was dissected out.  Hypopharynx was identified.  Dissection was carried posteriorly to the precervical fascia.  Inferior thyroid artery was dissected out along its length.  Carotid sheath was opened and there was no evidence of enlarged parathyroid.  Inferiorly dissection was carried along the trachea and the lateral esophagus without evidence of enlarged parathyroid gland.  Dry pack was placed in the right neck.  We again turned our attention to the left thyroid lobe.  Dissection along the inferior thyroid artery allowed for identification of the recurrent laryngeal nerve.  There was a small amount of tissue with adjacent adipose located at this point.  This was gently dissected out. It did not readily mobilize.  Biopsy was taken behind a medium Ligaclip and submitted to Pathology which confirmed thyroid tissue.  After thorough examination once more of both the left and right neck and after achieving good hemostasis, a decision was made to discontinue neck exploration.  Only 2 markedly enlarged glands had been identified.  No further parathyroid tissue was identified.  Thyroid gland itself appeared grossly normal.  The neck was irrigated with warm saline.  Fibrillar was placed throughout the operative field.  Strap muscles were reapproximated in the midline with interrupted 3-0 Vicryl sutures.  Platysma was closed with interrupted 3-0 Vicryl sutures.  Skin edges were reapproximated with a running 4-0 Monocryl subcuticular suture.  Wound was washed and dried and Benzoin and Steri-Strips were applied.  Sterile dressings were applied.  Next, the operative field was reset by placing the right arm on an armboard.  Previous drapes were all removed.  The right forearm was then prepped and draped in the usual aseptic fashion.  A 2nd time-out was performed.  Incision was made over the right  brachioradialis muscle.  Dissection was carried through subcutaneous tissues.  Subcutaneous flaps were elevated circumferentially and a Weitlaner retractor was placed for exposure. Right parathyroid gland was selected.  It was sectioned.  Ten 1 mm fragments were created from the gland.  These were then implanted individually into the brachioradialis muscle by incising the muscle fascia, creating a muscular pocket, inserting a fragment of parathyroid tissue, and closing the overlying fascia with a 4-0 Prolene suture. This exercise was repeated 10 times.  Subcutaneous tissues were then closed with interrupted 3-0 Vicryl sutures.  Skin was closed with a running 4-0 Monocryl subcuticular suture.  Wound was washed and dried and Benzoin and Steri-Strips were applied.  Sterile dressings were applied.  The patient was awakened from anesthesia and brought to the recovery room.  The patient tolerated the procedure well.  [name], MD, [facility] Office: [phone]   TMG/MEDQ  D:  03/22/2016  T:  03/23/2016  Job:  [id number]  cc:   [name], M.D. [name], M.D.

## 2016-03-23 NOTE — Procedures (Signed)
I have seen and examined this patient and agree with the plan of care   No complaints   Hb 11.7   Ca 7.4   Phos 6.1   Rajah Tagliaferro W 03/23/2016, 8:52 AM

## 2016-03-23 NOTE — Progress Notes (Signed)
1 Day Post-Op  Subjective: On dialysis and says he will be here a couple days.  I took down the neck site.  This looks fine.  Objective: Vital signs in last 24 hours: Temp:  [97.7 F (36.5 C)-98.8 F (37.1 C)] 98.2 F (36.8 C) (09/16 0742) Pulse Rate:  [68-90] 90 (09/16 1030) Resp:  [8-20] 20 (09/16 0742) BP: (137-199)/(75-118) 190/117 (09/16 1030) SpO2:  [92 %-100 %] 100 % (09/16 0300) Weight:  [104.6 kg (230 lb 9.6 oz)-107.7 kg (237 lb 7 oz)] 104.6 kg (230 lb 9.6 oz) (09/16 0742)  360 PO No urine Blood: 35 Stool none Afebrile, BP is up  Creatinine is 13 K+ 6.4 CBC is OK Intake/Output from previous day: 09/15 0701 - 09/16 0700 In: 885 [P.O.:360; I.V.:525] Out: 35 [Blood:35] Intake/Output this shift: No intake/output data recorded.  General appearance: alert, cooperative, no distress and on HD Throat: alert, cooperative, no distress and on dialysis I left are dresing in place for today.  Lab Results:   Recent Labs  03/22/16 0758 03/23/16 0742  WBC  --  7.6  HGB 12.6* 11.7*  HCT 37.0* 36.4*  PLT  --  169    BMET  Recent Labs  03/22/16 1600 03/23/16 0742  NA 138 137  K 5.9* 6.4*  CL 97* 95*  CO2 28 25  GLUCOSE 187* 93  BUN 34* 43*  CREATININE 11.49* 13.09*  CALCIUM 8.8* 7.4*   PT/INR No results for input(s): LABPROT, INR in the last 72 hours.   Recent Labs Lab 03/22/16 1600 03/23/16 0742  ALBUMIN 3.0* 2.9*     Lipase  No results found for: LIPASE   Studies/Results: No results found.  Medications: . calcitRIOL  1 mcg Oral TID PC  . calcium carbonate  800 mg of elemental calcium Oral TID WC  . [START ON 03/26/2016] ferric gluconate (FERRLECIT/NULECIT) IV  62.5 mg Intravenous Q Tue-HD  . ibuprofen  400 mg Oral BH-q7a  . lisinopril  20 mg Oral Daily    Assessment/Plan Secondary hyperparathyroidism  S/p parathyroidectomy autotransplant 9115/17, DR. Armandina Gemma ESRD on HD TTS  - on dialysis now FEN: Clears ID: pre op Ancef DVT:   SCD  Plan:  Doing well from surgery. Medical management per Renal service.   LOS: 1 day    JENNINGS,WILLARD 03/23/2016 [phone]  Agree with above.  Imogene Burn. Georgette Dover, MD, [facility]  General/ Trauma Surgery  03/23/2016 12:03 PM

## 2016-03-24 DIAGNOSIS — D649 Anemia, unspecified: Secondary | ICD-10-CM | POA: Diagnosis present

## 2016-03-24 DIAGNOSIS — Z7982 Long term (current) use of aspirin: Secondary | ICD-10-CM | POA: Diagnosis not present

## 2016-03-24 DIAGNOSIS — L7634 Postprocedural seroma of skin and subcutaneous tissue following other procedure: Secondary | ICD-10-CM | POA: Diagnosis not present

## 2016-03-24 DIAGNOSIS — Z8249 Family history of ischemic heart disease and other diseases of the circulatory system: Secondary | ICD-10-CM | POA: Diagnosis not present

## 2016-03-24 DIAGNOSIS — L7632 Postprocedural hematoma of skin and subcutaneous tissue following other procedure: Secondary | ICD-10-CM | POA: Diagnosis not present

## 2016-03-24 DIAGNOSIS — Z79899 Other long term (current) drug therapy: Secondary | ICD-10-CM | POA: Diagnosis not present

## 2016-03-24 DIAGNOSIS — E8770 Fluid overload, unspecified: Secondary | ICD-10-CM | POA: Diagnosis not present

## 2016-03-24 DIAGNOSIS — I5042 Chronic combined systolic (congestive) and diastolic (congestive) heart failure: Secondary | ICD-10-CM | POA: Diagnosis not present

## 2016-03-24 DIAGNOSIS — Z992 Dependence on renal dialysis: Secondary | ICD-10-CM | POA: Diagnosis not present

## 2016-03-24 DIAGNOSIS — I739 Peripheral vascular disease, unspecified: Secondary | ICD-10-CM | POA: Diagnosis present

## 2016-03-24 DIAGNOSIS — I272 Other secondary pulmonary hypertension: Secondary | ICD-10-CM | POA: Diagnosis not present

## 2016-03-24 DIAGNOSIS — Z952 Presence of prosthetic heart valve: Secondary | ICD-10-CM | POA: Diagnosis not present

## 2016-03-24 DIAGNOSIS — R599 Enlarged lymph nodes, unspecified: Secondary | ICD-10-CM | POA: Diagnosis not present

## 2016-03-24 DIAGNOSIS — D631 Anemia in chronic kidney disease: Secondary | ICD-10-CM | POA: Diagnosis not present

## 2016-03-24 DIAGNOSIS — I132 Hypertensive heart and chronic kidney disease with heart failure and with stage 5 chronic kidney disease, or end stage renal disease: Secondary | ICD-10-CM | POA: Diagnosis not present

## 2016-03-24 DIAGNOSIS — I120 Hypertensive chronic kidney disease with stage 5 chronic kidney disease or end stage renal disease: Secondary | ICD-10-CM | POA: Diagnosis not present

## 2016-03-24 DIAGNOSIS — N186 End stage renal disease: Secondary | ICD-10-CM | POA: Diagnosis not present

## 2016-03-24 DIAGNOSIS — Z6832 Body mass index (BMI) 32.0-32.9, adult: Secondary | ICD-10-CM | POA: Diagnosis not present

## 2016-03-24 DIAGNOSIS — I4510 Unspecified right bundle-branch block: Secondary | ICD-10-CM | POA: Diagnosis present

## 2016-03-24 DIAGNOSIS — N2581 Secondary hyperparathyroidism of renal origin: Secondary | ICD-10-CM | POA: Diagnosis not present

## 2016-03-24 LAB — RENAL FUNCTION PANEL
ALBUMIN: 2.8 g/dL — AB (ref 3.5–5.0)
ANION GAP: 14 (ref 5–15)
Albumin: 2.7 g/dL — ABNORMAL LOW (ref 3.5–5.0)
Anion gap: 18 — ABNORMAL HIGH (ref 5–15)
BUN: 26 mg/dL — ABNORMAL HIGH (ref 6–20)
BUN: 34 mg/dL — ABNORMAL HIGH (ref 6–20)
CALCIUM: 6.4 mg/dL — AB (ref 8.9–10.3)
CO2: 23 mmol/L (ref 22–32)
CO2: 25 mmol/L (ref 22–32)
CREATININE: 10.77 mg/dL — AB (ref 0.61–1.24)
Calcium: 7 mg/dL — ABNORMAL LOW (ref 8.9–10.3)
Chloride: 94 mmol/L — ABNORMAL LOW (ref 101–111)
Chloride: 93 mmol/L — ABNORMAL LOW (ref 101–111)
Creatinine, Ser: 9.73 mg/dL — ABNORMAL HIGH (ref 0.61–1.24)
GFR calc Af Amer: 6 mL/min — ABNORMAL LOW (ref >60)
GFR calc non Af Amer: 6 mL/min — ABNORMAL LOW (ref >60)
GFR calc non Af Amer: 5 mL/min — ABNORMAL LOW (ref >60)
GFR, EST AFRICAN AMERICAN: 6 mL/min — AB (ref >60)
GLUCOSE: 128 mg/dL — AB (ref 65–99)
Glucose, Bld: 86 mg/dL (ref 65–99)
PHOSPHORUS: 5.3 mg/dL — AB (ref 2.5–4.6)
Phosphorus: 4.9 mg/dL — ABNORMAL HIGH (ref 2.5–4.6)
Potassium: 4.7 mmol/L (ref 3.5–5.1)
Potassium: 4.4 mmol/L (ref 3.5–5.1)
SODIUM: 132 mmol/L — AB (ref 135–145)
Sodium: 135 mmol/L (ref 135–145)

## 2016-03-24 MED ORDER — CALCITRIOL 0.5 MCG PO CAPS
1.5000 ug | ORAL_CAPSULE | Freq: Three times a day (TID) | ORAL | Status: DC
  Administered 2016-03-24 (×3): 1.5 ug via ORAL
  Filled 2016-03-24 (×3): qty 3

## 2016-03-24 MED ORDER — CALCIUM CARBONATE ANTACID 500 MG PO CHEW
800.0000 mg | CHEWABLE_TABLET | Freq: Every day | ORAL | Status: DC
  Administered 2016-03-24 – 2016-03-25 (×2): 800 mg via ORAL
  Filled 2016-03-24 (×3): qty 4

## 2016-03-24 MED ORDER — CALCITRIOL 0.5 MCG PO CAPS
2.0000 ug | ORAL_CAPSULE | Freq: Three times a day (TID) | ORAL | Status: DC
  Administered 2016-03-25 – 2016-03-26 (×4): 2 ug via ORAL
  Filled 2016-03-24 (×4): qty 4

## 2016-03-24 NOTE — Progress Notes (Signed)
Patient seen post parathyroidectomy. Patient doing well, minimal pain/discomfort. Neck incision intact, Drsg R arm CDI. Ca down to 7.0 C Ca 8.04 Phos 4.9. On Calcitriol and TUMS, sensipar and binders have been Dc'd. HD on schedule yesterday. Will increase Calcitriol to 1.5 mcg PO TID, continue TUMS  4 tabs (800 mg elemental Ca) TID between meals,  2 tabs at bedtime. Continue checking renal profile. Next HD Tuesday if still in hospital.  Juanell Fairly, [place] [phone] (pager)

## 2016-03-24 NOTE — Progress Notes (Signed)
MD returned page to this Rn; MD is aware of Calcium level of 6.4.  MD order is to continue with current treatment of Tums and Calcitriol.  No new orders received.

## 2016-03-24 NOTE — Progress Notes (Addendum)
CRITICAL VALUE ALERT  Critical value received:  Calcium 6.4  Date of notification:  03/24/2016  Time of notification:  7354  Critical value read back: yes  Nurse who received alert:  Rosalita Chessman, RN   MD notified (1st page):  [place]  Time of first page:  1705  MD notified 2nd page: Kinsinger  Time of second page: [phone]

## 2016-03-24 NOTE — Progress Notes (Signed)
Lab value Calcium 7.0 received; MD paged.  Patient has been refusing Tums.  Patient states that MD has spoken with him this morning and patient agrees to take Tums as scheduled.  Morning meds of Calcitriol and Tums administered.  Dr. Zadie Cleverly called this RN back at 0930 and is aware of Calcium lab value.  No new orders received at this time.  Will continue with current orders; will continue to monitor.

## 2016-03-24 NOTE — Progress Notes (Signed)
Progress Note: General Surgery Service   Subjective: Tolerating liquids, no pain complaints, Ca noted, taking tums now  Objective: Vital signs in last 24 hours: Temp:  [97.8 F (36.6 C)-99.5 F (37.5 C)] 97.8 F (36.6 C) (09/17 0957) Pulse Rate:  [81-94] 81 (09/17 0957) Resp:  [20] 20 (09/17 0957) BP: (126-205)/(99-121) 144/103 (09/17 0957) SpO2:  [95 %-100 %] 98 % (09/17 0957) Weight:  [102.3 kg (225 lb 8.5 oz)-103.2 kg (227 lb 8.2 oz)] 103.2 kg (227 lb 8.2 oz) (09/16 1900)    Intake/Output from previous day: 09/16 0701 - 09/17 0700 In: 120 [P.O.:120] Out: 2339  Intake/Output this shift: No intake/output data recorded.  Lungs: CTAB  Cardiovascular: RRR  Abd: soft, NT, ND  Extremities: no edema, right arm wrapped  Neuro: AOx4  Neck: incision c/d/i  Lab Results: CBC   Recent Labs  03/22/16 0758 03/23/16 0742  WBC  --  7.6  HGB 12.6* 11.7*  HCT 37.0* 36.4*  PLT  --  169   BMET  Recent Labs  03/23/16 1610 03/24/16 0500  NA 135 135  K 4.6 4.7  CL 96* 94*  CO2 28 23  GLUCOSE 94 86  BUN 20 26*  CREATININE 8.11* 9.73*  CALCIUM 7.8* 7.0*   PT/INR No results for input(s): LABPROT, INR in the last 72 hours. ABG No results for input(s): PHART, HCO3 in the last 72 hours.  Invalid input(s): PCO2, PO2  Studies/Results:  Anti-infectives: Anti-infectives    Start     Dose/Rate Route Frequency Ordered Stop   03/22/16 0830  ceFAZolin (ANCEF) IVPB 2g/100 mL premix     2 g 200 mL/hr over 30 Minutes Intravenous To ShortStay Surgical 03/21/16 1400 03/22/16 0931      Medications: Scheduled Meds: . calcitRIOL  1.5 mcg Oral TID PC  . calcium carbonate  800 mg of elemental calcium Oral TID BM  . [START ON 03/26/2016] ferric gluconate (FERRLECIT/NULECIT) IV  62.5 mg Intravenous Q Tue-HD  . ibuprofen  400 mg Oral BH-q7a  . lisinopril  20 mg Oral Daily   Continuous Infusions: . sodium chloride 25 mL/hr (03/22/16 0755)  . sodium chloride     PRN  Meds:.acetaminophen **OR** acetaminophen, HYDROmorphone (DILAUDID) injection, oxyCODONE-acetaminophen  Assessment/Plan: Patient Active Problem List   Diagnosis Date Noted  . Secondary hyperparathyroidism of renal origin ([place]) 03/22/2016  . Hyperparathyroidism, secondary ([place]) 03/21/2016  . Lung nodules   . ESRD on dialysis ([place])   . Mediastinal adenopathy   . 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 Renal mass   . Cavitary lesion of lung 04/06/2015  . Mitral valve replaced 04/06/2015  . Anticoagulant long-term use 04/06/2015  . Anemia in chronic kidney disease 04/06/2015  . Coagulopathy ([place]) 04/06/2015  . Multiple pulmonary nodules   . Endocarditis 04/05/2015  . Weakness of left lower extremity 11/14/2014  . Encounter for therapeutic drug monitoring 06/16/2014  . Paroxysmal atrial fibrillation ([place]) 06/13/2014  . S/P minimally invasive mitral valve repair 04/05/2014  . Septic embolism to left lower extremity 03/25/2014  . 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 Chronic diastolic congestive heart failure ([place])   . Mitral valve vegetation 03/23/2014  . Severe mitral regurgitation 03/23/2014  . Hypertension 03/22/2014  . Acute bronchitis 03/22/2014  . T wave inversion in EKG 03/22/2014  . QT prolongation 03/22/2014  . Acute combined systolic and diastolic congestive heart failure ([place]) 03/21/2014  . Bacterial endocarditis - MSSA positive blood cultures with mitral  valve vegetation, severe mitral regurgitation, and septic embolization 03/21/2014  . Knee pain, left 03/21/2014  . DVT of upper extremity (deep vein thrombosis) ([place]) 03/04/2014  . Positive blood culture 11/27/2013  . End stage renal disease ([place]) 06/30/2013   s/p Procedure(s): PARATHYROIDECTOMY AUTOTRANSPLANT 03/22/2016 -advance diet -f/u renal recs   LOS: 1 day   Mickeal Skinner, MD Pg# [phone] [facility]

## 2016-03-25 ENCOUNTER — Encounter (HOSPITAL_COMMUNITY): Payer: Self-pay | Admitting: Surgery

## 2016-03-25 LAB — RENAL FUNCTION PANEL
Albumin: 2.9 g/dL — ABNORMAL LOW (ref 3.5–5.0)
Albumin: 2.8 g/dL — ABNORMAL LOW (ref 3.5–5.0)
Anion gap: 16 — ABNORMAL HIGH (ref 5–15)
Anion gap: 13 (ref 5–15)
BUN: 39 mg/dL — ABNORMAL HIGH (ref 6–20)
BUN: 44 mg/dL — ABNORMAL HIGH (ref 6–20)
CO2: 24 mmol/L (ref 22–32)
CO2: 28 mmol/L (ref 22–32)
Calcium: 6.7 mg/dL — ABNORMAL LOW (ref 8.9–10.3)
Calcium: 6.3 mg/dL — CL (ref 8.9–10.3)
Chloride: 94 mmol/L — ABNORMAL LOW (ref 101–111)
Chloride: 92 mmol/L — ABNORMAL LOW (ref 101–111)
Creatinine, Ser: 12.33 mg/dL — ABNORMAL HIGH (ref 0.61–1.24)
Creatinine, Ser: 13.63 mg/dL — ABNORMAL HIGH (ref 0.61–1.24)
GFR calc Af Amer: 5 mL/min — ABNORMAL LOW (ref >60)
GFR calc Af Amer: 4 mL/min — ABNORMAL LOW (ref >60)
GFR calc non Af Amer: 4 mL/min — ABNORMAL LOW (ref >60)
GFR calc non Af Amer: 4 mL/min — ABNORMAL LOW (ref >60)
Glucose, Bld: 91 mg/dL (ref 65–99)
Glucose, Bld: 102 mg/dL — ABNORMAL HIGH (ref 65–99)
Phosphorus: 4.8 mg/dL — ABNORMAL HIGH (ref 2.5–4.6)
Phosphorus: 5.2 mg/dL — ABNORMAL HIGH (ref 2.5–4.6)
Potassium: 4.2 mmol/L (ref 3.5–5.1)
Potassium: 4.2 mmol/L (ref 3.5–5.1)
Sodium: 134 mmol/L — ABNORMAL LOW (ref 135–145)
Sodium: 133 mmol/L — ABNORMAL LOW (ref 135–145)

## 2016-03-25 MED ORDER — SODIUM CHLORIDE 0.9% FLUSH: 10.0000 mL | INTRAVENOUS | Status: DC | PRN

## 2016-03-25 MED ORDER — RENA-VITE PO TABS
1.0000 | ORAL_TABLET | Freq: Every day | ORAL | Status: DC
  Administered 2016-03-25 – 2016-03-27 (×3): 1 via ORAL
  Filled 2016-03-25 (×4): qty 1

## 2016-03-25 MED ORDER — LISINOPRIL 20 MG PO TABS
20.0000 mg | ORAL_TABLET | Freq: Every day | ORAL | Status: DC
  Administered 2016-03-25 – 2016-03-28 (×4): 20 mg via ORAL
  Filled 2016-03-25 (×4): qty 1

## 2016-03-25 NOTE — Progress Notes (Signed)
  General Surgery [facility]  Assessment & Plan: Status post neck exploration and parathyroidectomy with autotransplantation  Stable from surgical standpoint  Check calcium level in AM  Home when stable from renal standpoint and can be managed in out-patient dialysis  [name], MD, [facility] Office: [phone]   Subjective: POD#3 - patient up in chair, no complaints.  Anticipating HD tomorrow.  Pain controlled on ibuprofen.  Objective: Vital signs in last 24 hours: Temp:  [98.1 F (36.7 C)-98.8 F (37.1 C)] 98.8 F (37.1 C) (09/18 0936) Pulse Rate:  [81-86] 81 (09/18 0936) Resp:  [17-20] 17 (09/18 0936) BP: (142-174)/(84-98) 150/89 (09/18 0936) SpO2:  [98 %-100 %] 100 % (09/18 0936) Last BM Date: 03/23/16  Intake/Output from previous day: 09/17 0701 - 09/18 0700 In: 240 [P.O.:240] Out: 0  Intake/Output this shift: Total I/O In: 240 [P.O.:240] Out: 0   Physical Exam: HEENT - sclerae clear, mucous membranes moist Neck - wound dry and intact; mild STS; voice nearly normal Ext - no edema, non-tender; right forearm incision dry and intact with steristrips Neuro - alert & oriented, no focal deficits  Lab Results:   Recent Labs  03/23/16 0742  WBC 7.6  HGB 11.7*  HCT 36.4*  PLT 169   BMET  Recent Labs  03/24/16 1624 03/25/16 0455  NA 132* 134*  K 4.4 4.2  CL 93* 94*  CO2 25 24  GLUCOSE 128* 91  BUN 34* 39*  CREATININE 10.77* 12.33*  CALCIUM 6.4* 6.7*   PT/INR No results for input(s): LABPROT, INR in the last 72 hours. Comprehensive Metabolic Panel:    Component Value Date/Time   NA 134 (L) 03/25/2016 0455   NA 132 (L) 03/24/2016 1624   K 4.2 03/25/2016 0455   K 4.4 03/24/2016 1624   CL 94 (L) 03/25/2016 0455   CL 93 (L) 03/24/2016 1624   CO2 24 03/25/2016 0455   CO2 25 03/24/2016 1624   BUN 39 (H) 03/25/2016 0455   BUN 34 (H) 03/24/2016 1624   CREATININE  12.33 (H) 03/25/2016 0455   CREATININE 10.77 (H) 03/24/2016 1624   GLUCOSE 91 03/25/2016 0455   GLUCOSE 128 (H) 03/24/2016 1624   CALCIUM 6.7 (L) 03/25/2016 0455   CALCIUM 6.4 (LL) 03/24/2016 1624   AST 14 (L) 04/06/2015 0515   AST 13 06/13/2014 1036   ALT 9 (L) 04/06/2015 0515   ALT 7 06/13/2014 1036   ALKPHOS 54 04/06/2015 0515   ALKPHOS 84 06/13/2014 1036   BILITOT 1.5 (H) 04/06/2015 0515   BILITOT 0.4 06/13/2014 1036   PROT 6.8 04/06/2015 0515   PROT 7.7 06/13/2014 1036   ALBUMIN 2.9 (L) 03/25/2016 0455   ALBUMIN 2.8 (L) 03/24/2016 1624    Studies/Results: No results found.    [name], MD, [facility] Office: [place] 03/25/2016  Patient ID: Timothy Palmer, male   DOB: 07/27/1965, 50 y.o.   MRN: [id number]

## 2016-03-25 NOTE — Progress Notes (Signed)
[name] Progress Note   Dialysis Orders: [facility] 4 hr EDW just decreased from 103 to 102 due to HTN heparin 5000 right IJ and left AVF 400/800 venofer 50 per week - no VDRA due to high Ca no Mircera (last dose was 150 on 8/31 and 8/17 Recent labs:  hgb 11.7 24% sat iPTH 3194 8/24 - on renvela 3 sensipar 90 bid; lisinopril 20 bid (per med list but he is taking only 20)and amlodpine 10 added 9/7 but not picked up Rx yet  Assessment/Plan: 1. S/P parathyroidectomy/MBD - 1 large gland on right and left each 3-4 cm- one autotransplanted- iPTHs have been in the 3000 range with hypercalcemia prior to admission - stopped renvela and sensipar; significant hungry bone syndrome - Ca drop finally seems to have stabilized 6.7 this am- correct 7.8 - favor keeping one more day - on calcitriol daily plus times tid and hs and added Ca bath with dialysis 2.  ESRD -  TTS next HD Tuesday first round- hold heparin - previously on 2 Ca bath - 3.5 bath for discharge  3.  Hypertension/volume  -recent BP high - norvasc added to lisinopril but he didn't pick up - titrate edw as much as able before increasing meds (post HD wt 102.5 Thursday with post BP 170/100s). Net UF 2.3 on Satuday with post weight 102.3 - needs to be lower 4.  Anemia  -weekly Fe - hgb may drop off Mircera - received 2 doses 150 in August with hgb is the 11s - watch - would resume while here if it drops < 11 - was 11.7 on 9/16 5.  Nutrition - renal diet - resume vitamins  Myriam Jacobson, PA-C [place] [phone] 03/25/2016,9:38 AM  LOS: 2 days   Pt seen, examined and agree w A/P as above.  Kelly Splinter MD [name] pager [phone]    cell [phone] 03/25/2016, 12:41 PM    Subjective:   Didn't sleep well last night - eating ok. Tingling in hands not face  Objective Vitals:   03/24/16 1700 03/24/16 2305 03/25/16 0457 03/25/16 0936  BP: (!) 159/98 (!) 142/91 (!) 174/84 (!) 150/89   Pulse: 83 84 86 81  Resp: 20 18 18 17   Temp: 98.3 F (36.8 C) 98.8 F (37.1 C) 98.1 F (36.7 C) 98.8 F (37.1 C)  TempSrc: Oral Oral Oral Oral  SpO2: 98% 100% 100% 100%  Weight:      Height:       Physical Exam General: NAD  Heart: RRR Lungs: no rales Abdomen: soft NT + bs Extremities: tr LLE edema Dialysis Access: right IJ and left upper AVF maturing + bruit - slight left UE edema  Additional Objective Labs: Basic Metabolic Panel:  Recent Labs Lab 03/24/16 0500 03/24/16 1624 03/25/16 0455  NA 135 132* 134*  K 4.7 4.4 4.2  CL 94* 93* 94*  CO2 23 25 24   GLUCOSE 86 128* 91  BUN 26* 34* 39*  CREATININE 9.73* 10.77* 12.33*  CALCIUM 7.0* 6.4* 6.7*  PHOS 4.9* 5.3* 4.8*   Liver Function Tests:  Recent Labs Lab 03/24/16 0500 03/24/16 1624 03/25/16 0455  ALBUMIN 2.7* 2.8* 2.9*   CBC:  Recent Labs Lab 03/22/16 0758 03/23/16 0742  WBC  --  7.6  HGB 12.6* 11.7*  HCT 37.0* 36.4*  MCV  --  94.1  PLT  --  169   Medications: . sodium chloride 25 mL/hr (03/22/16 0755)  . sodium chloride     .  calcitRIOL  2 mcg Oral TID PC  . calcium carbonate  800 mg of elemental calcium Oral TID BM  . calcium carbonate  800 mg of elemental calcium Oral QHS  . [START ON 03/26/2016] ferric gluconate (FERRLECIT/NULECIT) IV  62.5 mg Intravenous Q Tue-HD  . ibuprofen  400 mg Oral BH-q7a  . lisinopril  20 mg Oral Daily

## 2016-03-25 NOTE — Progress Notes (Signed)
Elevated BP of 170/95. No BP meds ordered. Contacted Dr. Ninfa Linden with call back. New order to place pt back on home BP med Linsinpril 20 mg PO daily. Isac Caddy, RN

## 2016-03-25 NOTE — Progress Notes (Addendum)
CRITICAL VALUE ALERT  Critical value received:  Ca+ 6.3  Date of notification:  03/25/16  Time of notification:  17:04  Critical value read back: yes  Nurse who received alert:  Cassell Smiles  MD notified (1st page): Dr. Ninfa Linden on call   Time of first page:  17:16 - responded 17:23  MD notified (2nd page):Dr. Joelyn Oms, nephrology 17:27 - responded 17:30  Time of second page:  Responding MD:    Time MD responded:

## 2016-03-26 LAB — RENAL FUNCTION PANEL
Albumin: 2.7 g/dL — ABNORMAL LOW (ref 3.5–5.0)
Albumin: 3 g/dL — ABNORMAL LOW (ref 3.5–5.0)
Anion gap: 17 — ABNORMAL HIGH (ref 5–15)
Anion gap: 14 (ref 5–15)
BUN: 48 mg/dL — ABNORMAL HIGH (ref 6–20)
BUN: 19 mg/dL (ref 6–20)
CO2: 25 mmol/L (ref 22–32)
CO2: 25 mmol/L (ref 22–32)
Calcium: 6.4 mg/dL — CL (ref 8.9–10.3)
Calcium: 8.7 mg/dL — ABNORMAL LOW (ref 8.9–10.3)
Chloride: 93 mmol/L — ABNORMAL LOW (ref 101–111)
Chloride: 96 mmol/L — ABNORMAL LOW (ref 101–111)
Creatinine, Ser: 14.34 mg/dL — ABNORMAL HIGH (ref 0.61–1.24)
Creatinine, Ser: 7.52 mg/dL — ABNORMAL HIGH (ref 0.61–1.24)
GFR calc Af Amer: 4 mL/min — ABNORMAL LOW (ref >60)
GFR calc Af Amer: 9 mL/min — ABNORMAL LOW (ref >60)
GFR calc non Af Amer: 3 mL/min — ABNORMAL LOW (ref >60)
GFR calc non Af Amer: 8 mL/min — ABNORMAL LOW (ref >60)
Glucose, Bld: 90 mg/dL (ref 65–99)
Glucose, Bld: 78 mg/dL (ref 65–99)
Phosphorus: 5.3 mg/dL — ABNORMAL HIGH (ref 2.5–4.6)
Phosphorus: 3.6 mg/dL (ref 2.5–4.6)
Potassium: 4.2 mmol/L (ref 3.5–5.1)
Potassium: 3.6 mmol/L (ref 3.5–5.1)
Sodium: 135 mmol/L (ref 135–145)
Sodium: 135 mmol/L (ref 135–145)

## 2016-03-26 LAB — CBC
HCT: 36.3 % — ABNORMAL LOW (ref 39.0–52.0)
Hemoglobin: 11.8 g/dL — ABNORMAL LOW (ref 13.0–17.0)
MCH: 29.9 pg (ref 26.0–34.0)
MCHC: 32.5 g/dL (ref 30.0–36.0)
MCV: 92.1 fL (ref 78.0–100.0)
Platelets: 228 10*3/uL (ref 150–400)
RBC: 3.94 MIL/uL — ABNORMAL LOW (ref 4.22–5.81)
RDW: 17.8 % — ABNORMAL HIGH (ref 11.5–15.5)
WBC: 5.4 10*3/uL (ref 4.0–10.5)

## 2016-03-26 MED ORDER — DOXERCALCIFEROL 4 MCG/2ML IV SOLN
INTRAVENOUS | Status: AC
  Filled 2016-03-26: qty 6

## 2016-03-26 MED ORDER — HYDROCODONE-ACETAMINOPHEN 5-325 MG PO TABS: 1.0000 | ORAL_TABLET | ORAL | 0 refills | Status: DC | PRN

## 2016-03-26 MED ORDER — CALCITRIOL 0.5 MCG PO CAPS: 4.0000 ug | ORAL_CAPSULE | Freq: Three times a day (TID) | ORAL | Status: DC

## 2016-03-26 MED ORDER — HYDRALAZINE HCL 20 MG/ML IJ SOLN
INTRAMUSCULAR | Status: AC
  Administered 2016-03-26: 10 mg via INTRAVENOUS
  Filled 2016-03-26: qty 1

## 2016-03-26 MED ORDER — CALCIUM CARBONATE 1250 MG/5ML PO SUSP
1000.0000 mg | Freq: Three times a day (TID) | ORAL | Status: DC
  Administered 2016-03-26 – 2016-03-27 (×4): 1000 mg via ORAL
  Filled 2016-03-26 (×6): qty 10

## 2016-03-26 MED ORDER — HYDRALAZINE HCL 20 MG/ML IJ SOLN
10.0000 mg | Freq: Once | INTRAMUSCULAR | Status: AC
  Administered 2016-03-26: 10 mg via INTRAVENOUS

## 2016-03-26 MED ORDER — DOXERCALCIFEROL 4 MCG/2ML IV SOLN
12.0000 ug | INTRAVENOUS | Status: DC
  Administered 2016-03-26 – 2016-03-28 (×2): 12 ug via INTRAVENOUS
  Filled 2016-03-26: qty 6

## 2016-03-26 NOTE — Care Management Important Message (Signed)
Important Message  Patient Details  Name: Timothy Palmer MRN: [id number] Date of Birth: 07/07/1966   Medicare Important Message Given:  Yes    [name] [name] 03/26/2016, 10:57 AM

## 2016-03-26 NOTE — Progress Notes (Signed)
CRITICAL VALUE ALERT  Critical value received:  Calcium = 6.4  Date of notification:  03/26/16  Time of notification:  8719  Critical value read back:Yes.    Nurse who received alert:  K. Laurance Flatten  MD notified (1st page):  Dr. Joelyn Oms  Time of first page:  [phone]

## 2016-03-26 NOTE — Progress Notes (Signed)
[name] Progress Note   Subjective: "I'm doing OK. I think that fistula is harder today than is has been". Says he has some tingling L hand, No facial tingling or twitches. Denies pain.   Objective Vitals:   03/25/16 0936 03/25/16 1737 03/25/16 2033 03/26/16 0430  BP: (!) 150/89 (!) 139/93 (!) 170/95 (!) 152/87  Pulse: 81 78 78 75  Resp: 17 18 19 19   Temp: 98.8 F (37.1 C) 98.8 F (37.1 C) 98.4 F (36.9 C) 97.8 F (36.6 C)  TempSrc: Oral Oral Oral Oral  SpO2: 100% 100% 100% 100%  Weight:      Height:       Physical Exam General: pleasant, NAD. HEENT: Steri-strips intact neck. No swelling.  Heart: S1, S2, RRR Lungs:Bilateral breath sounds CTA A/P Abdomen: soft, nontender.active BS Extremities: Steri-strips intact RFA. No LE edema.  Dialysis Access: [facility] drsg CDI. LUA AVF + bruit. Swollen, + bruit.   Dialysis Orders: [facility]  4 hr 400/800 2.0K/2.0 Ca Heparin 5000 units IV q tx No ESA Venofer 50 mg IV weekly   Recent labs:  hgb 11.7 24% sat iPTH 3194 8/24 - on renvela 3 sensipar 90 bid; lisinopril 20 bid (per med list but he is taking only 20)and amlodpine 10 added 9/7 but not picked up Rx yet  Additional Objective Labs: Basic Metabolic Panel:  Recent Labs Lab 03/25/16 0455 03/25/16 1537 03/26/16 0326  NA 134* 133* 135  K 4.2 4.2 4.2  CL 94* 92* 93*  CO2 24 28 25   GLUCOSE 91 102* 90  BUN 39* 44* 48*  CREATININE 12.33* 13.63* 14.34*  CALCIUM 6.7* 6.3* 6.4*  PHOS 4.8* 5.2* 5.3*   Liver Function Tests:  Recent Labs Lab 03/25/16 0455 03/25/16 1537 03/26/16 0326  ALBUMIN 2.9* 2.8* 2.7*   No results for input(s): LIPASE, AMYLASE in the last 168 hours. CBC:  Recent Labs Lab 03/22/16 0758 03/23/16 0742  WBC  --  7.6  HGB 12.6* 11.7*  HCT 37.0* 36.4*  MCV  --  94.1  PLT  --  169   Blood Culture    Component Value Date/Time   SDES BLOOD BLOOD RIGHT HAND 08/18/2015 1043   SPECREQUEST IN PEDIATRIC BOTTLE 2CC 08/18/2015 1043    CULT NO GROWTH 5 DAYS 08/18/2015 1043   REPTSTATUS 08/23/2015 FINAL 08/18/2015 1043    Cardiac Enzymes: No results for input(s): CKTOTAL, CKMB, CKMBINDEX, TROPONINI in the last 168 hours. CBG: No results for input(s): GLUCAP in the last 168 hours. Iron Studies: No results for input(s): IRON, TIBC, TRANSFERRIN, FERRITIN in the last 72 hours. @lablastinr3 @ Studies/Results: No results found. Medications: . sodium chloride 25 mL/hr (03/22/16 0755)  . sodium chloride     . calcium carbonate (dosed in mg elemental calcium)  1,000 mg of elemental calcium Oral TID BM  . doxercalciferol  12 mcg Intravenous Q T,Th,Sa-HD  . ferric gluconate (FERRLECIT/NULECIT) IV  62.5 mg Intravenous Q Tue-HD  . ibuprofen  400 mg Oral BH-q7a  . lisinopril  20 mg Oral Daily  . multivitamin  1 tablet Oral QHS     Assessment/Plan: 1. S/P parathyroidectomy/MBD - 1 large gland on right and left each 3-4 cm- one autotransplanted- iPTHs have been in the 3000 range with hypercalcemia prior to admission - stopped renvela and sensipar; significant hungry bone syndrome. Ca 6.3-6.4. Change to liquid Ca-hopefully better compliance, high dose hectoral with HD, 3.5 Ca bath. Cont following renal panel.  Changing to liquid CaCO3 one gm elemental  Ca+ tid between meals (total 3 gm/d), hectorol 12 ug tiw w HD, and high Ca+ bath.   2. ESRD- TTS next HD today. -tight heparin. 3.5 Ca bath. K+ 4.2 ON HD now.  3. Hypertension/volume-recent BP high - norvasc added to lisinopril but he didn't pick up - titrate edw as much as able before increasing meds.Pre wt today under OP EDW 100.5 kg. He says he feels like he got a lot of IVF wants 3 liters pulled however 2 liters is more reasonable. Lower EDW when dc'd.  4. Anemia-HGB 11.7 No OP ESA. Follow HGB 5. Nutrition- Albumin 2.7 renal diet/nepro/renal vits 6. Vascular access - L AVF 1st stage BVT on 12/27/15 and 2nd stage on 02/12/16.  Sig swelling of the left upper arm where the AVF is,  have asked VVS to evaluate.  AVF has not been used yet.  Has R IJ cath.     Rita H. Brown NP-C 03/26/2016, 10:15 AM  [name] [phone]  Pt seen, examined, agree w assess/plan as above with additions as indicated.  Kelly Splinter MD [employer] pager [phone]    cell [phone] 03/26/2016, 12:28 PM

## 2016-03-26 NOTE — Progress Notes (Signed)
    Subjective  -  Asked to see patient regarding his fistula.  He is s/p left 2nd stage BVT by Dr. Bridgett Larsson on 02-12-2016.  He states that the area is hard and tender.   Physical Exam:  Palpable thrill in left arm BVT with area of fullness consistent with seroma vs hematoma       Assessment/Plan:    I suspect he has a hematoma vs a seroma from his 2nd stage BVT.  I would recommend conservative treatment for now with arm elevation and resting of his fistula.  Would consider I&D of the area, but the patient does not want surgery currently.  Dr. Bridgett Larsson to re-evaluate tomorrow.  With time, the area should improve, however exploration could improve the discomfort and expedite use of the fistula.  Annamarie Major 03/26/2016 9:45 PM --  Vitals:   03/26/16 1451 03/26/16 2046  BP: (!) 208/119 (!) 180/160  Pulse: 84 93  Resp: 16 18  Temp: 98.1 F (36.7 C) 98.9 F (37.2 C)    Intake/Output Summary (Last 24 hours) at 03/26/16 2145 Last data filed at 03/26/16 2050  Gross per 24 hour  Intake          3376.25 ml  Output             1975 ml  Net          1401.25 ml     Laboratory CBC    Component Value Date/Time   WBC 5.4 03/26/2016 1629   HGB 11.8 (L) 03/26/2016 1629   HCT 36.3 (L) 03/26/2016 1629   PLT 228 03/26/2016 1629    BMET    Component Value Date/Time   NA 135 03/26/2016 1547   K 3.6 03/26/2016 1547   CL 96 (L) 03/26/2016 1547   CO2 25 03/26/2016 1547   GLUCOSE 78 03/26/2016 1547   BUN 19 03/26/2016 1547   CREATININE 7.52 (H) 03/26/2016 1547   CALCIUM 8.7 (L) 03/26/2016 1547   GFRNONAA 8 (L) 03/26/2016 1547   GFRAA 9 (L) 03/26/2016 1547    COAG Lab Results  Component Value Date   INR 1.0 05/04/2015   INR 2.27 (H) 04/14/2015   INR 2.15 (H) 04/13/2015   No results found for: PTT  Antibiotics Anti-infectives    Start     Dose/Rate Route Frequency Ordered Stop   03/22/16 0830  ceFAZolin (ANCEF) IVPB 2g/100 mL premix     2 g 200 mL/hr over 30 Minutes  Intravenous To ShortStay Surgical 03/21/16 1400 03/22/16 0931       V. Leia Alf, M.D. [facility] Office: [phone] Pager:  [phone]

## 2016-03-26 NOTE — Progress Notes (Deleted)
    Postoperative Access Visit   History of Present Illness  Timothy Palmer is a 50 y.o. year old male who presents for postoperative follow-up for: L 2nd stage BVT (Date: 02/12/16).  The patient's wounds are *** healed.  The patient notes *** steal symptoms.  The patient is *** able to complete their activities of daily living.  The patient's current symptoms are: ***.  For VQI Use Only  PRE-ADM LIVING: Home  AMB STATUS: Ambulatory  Physical Examination There were no vitals filed for this visit.  LUE: Incision is *** healed, skin feels ***, hand grip is ***/5, sensation in digits is *** intact, ***palpable thrill, bruit can *** be auscultated   Medical Decision Making  Timothy Palmer is a 50 y.o. year old male who presents s/p L 2nd stage BVT.   The patient's access is *** ready for use.  The patient's tunneled dialysis catheter can be removed after two successful cannulations and completed dialysis treatments.  Thank you for allowing [place] to participate in this patient's care.  Adele Barthel, MD, FACS [facility] [place] Office: [phone] Pager: [phone]

## 2016-03-26 NOTE — Progress Notes (Signed)
  General Surgery [facility]  Assessment & Plan: Status post neck exploration and parathyroidectomy with autotransplantation             Stable from surgical standpoint - wounds clear and dry and intact, pain controlled             Calcium level 6.3 - 6.4 this AM             Home when stable from renal standpoint and can be managed in out-patient dialysis  Plan HD this AM per patient  Subjective: POD#4  Patient up in chair, comfortable.  No complaints.  Awaiting HD.  Objective: Vital signs in last 24 hours: Temp:  [97.8 F (36.6 C)-98.8 F (37.1 C)] 97.8 F (36.6 C) (09/19 0430) Pulse Rate:  [75-81] 75 (09/19 0430) Resp:  [17-19] 19 (09/19 0430) BP: (139-170)/(87-95) 152/87 (09/19 0430) SpO2:  [100 %] 100 % (09/19 0430) Last BM Date: 03/23/16  Intake/Output from previous day: 09/18 0701 - 09/19 0700 In: 720 [P.O.:720] Out: 0  Intake/Output this shift: No intake/output data recorded.  Physical Exam: HEENT - sclerae clear, mucous membranes moist Neck - wound dry and intact, mild STS; voice near normal Ext - no edema, non-tender; right forearm wound dry and intact Neuro - alert & oriented, no focal deficits  Lab Results:  No results for input(s): WBC, HGB, HCT, PLT in the last 72 hours. BMET  Recent Labs  03/25/16 1537 03/26/16 0326  NA 133* 135  K 4.2 4.2  CL 92* 93*  CO2 28 25  GLUCOSE 102* 90  BUN 44* 48*  CREATININE 13.63* 14.34*  CALCIUM 6.3* 6.4*   PT/INR No results for input(s): LABPROT, INR in the last 72 hours. Comprehensive Metabolic Panel:    Component Value Date/Time   NA 135 03/26/2016 0326   NA 133 (L) 03/25/2016 1537   K 4.2 03/26/2016 0326   K 4.2 03/25/2016 1537   CL 93 (L) 03/26/2016 0326   CL 92 (L) 03/25/2016 1537   CO2 25 03/26/2016 0326   CO2 28 03/25/2016 1537   BUN 48 (H) 03/26/2016 0326   BUN 44 (H) 03/25/2016 1537   CREATININE 14.34 (H) 03/26/2016 0326   CREATININE 13.63 (H) 03/25/2016 1537   GLUCOSE  90 03/26/2016 0326   GLUCOSE 102 (H) 03/25/2016 1537   CALCIUM 6.4 (LL) 03/26/2016 0326   CALCIUM 6.3 (LL) 03/25/2016 1537   AST 14 (L) 04/06/2015 0515   AST 13 06/13/2014 1036   ALT 9 (L) 04/06/2015 0515   ALT 7 06/13/2014 1036   ALKPHOS 54 04/06/2015 0515   ALKPHOS 84 06/13/2014 1036   BILITOT 1.5 (H) 04/06/2015 0515   BILITOT 0.4 06/13/2014 1036   PROT 6.8 04/06/2015 0515   PROT 7.7 06/13/2014 1036   ALBUMIN 2.7 (L) 03/26/2016 0326   ALBUMIN 2.8 (L) 03/25/2016 1537    Studies/Results: No results found.  Assessment & Plan:      Refer to beginning of this note.  [name], MD, [facility] Office: [place] 03/26/2016  Patient ID: Timothy Palmer, male   DOB: April 18, 1966, 50 y.o.   MRN: [id number]

## 2016-03-27 ENCOUNTER — Encounter: Payer: Medicare Other | Admitting: Vascular Surgery

## 2016-03-27 LAB — RENAL FUNCTION PANEL
Albumin: 2.8 g/dL — ABNORMAL LOW (ref 3.5–5.0)
Albumin: 3 g/dL — ABNORMAL LOW (ref 3.5–5.0)
Anion gap: 15 (ref 5–15)
Anion gap: 13 (ref 5–15)
BUN: 26 mg/dL — ABNORMAL HIGH (ref 6–20)
BUN: 29 mg/dL — ABNORMAL HIGH (ref 6–20)
CO2: 22 mmol/L (ref 22–32)
CO2: 27 mmol/L (ref 22–32)
Calcium: 7.3 mg/dL — ABNORMAL LOW (ref 8.9–10.3)
Calcium: 7.4 mg/dL — ABNORMAL LOW (ref 8.9–10.3)
Chloride: 96 mmol/L — ABNORMAL LOW (ref 101–111)
Chloride: 95 mmol/L — ABNORMAL LOW (ref 101–111)
Creatinine, Ser: 9.78 mg/dL — ABNORMAL HIGH (ref 0.61–1.24)
Creatinine, Ser: 10.76 mg/dL — ABNORMAL HIGH (ref 0.61–1.24)
GFR calc Af Amer: 6 mL/min — ABNORMAL LOW (ref >60)
GFR calc Af Amer: 6 mL/min — ABNORMAL LOW (ref >60)
GFR calc non Af Amer: 6 mL/min — ABNORMAL LOW (ref >60)
GFR calc non Af Amer: 5 mL/min — ABNORMAL LOW (ref >60)
Glucose, Bld: 123 mg/dL — ABNORMAL HIGH (ref 65–99)
Glucose, Bld: 87 mg/dL (ref 65–99)
Phosphorus: 3.8 mg/dL (ref 2.5–4.6)
Phosphorus: 4.3 mg/dL (ref 2.5–4.6)
Potassium: 4 mmol/L (ref 3.5–5.1)
Potassium: 4.3 mmol/L (ref 3.5–5.1)
Sodium: 133 mmol/L — ABNORMAL LOW (ref 135–145)
Sodium: 135 mmol/L (ref 135–145)

## 2016-03-27 MED ORDER — AMLODIPINE BESYLATE 10 MG PO TABS
10.0000 mg | ORAL_TABLET | Freq: Every day | ORAL | Status: DC
  Administered 2016-03-27 – 2016-03-28 (×2): 10 mg via ORAL
  Filled 2016-03-27 (×2): qty 1

## 2016-03-27 MED ORDER — CALCIUM CARBONATE 1250 MG/5ML PO SUSP
1500.0000 mg | Freq: Three times a day (TID) | ORAL | Status: DC
  Administered 2016-03-27 – 2016-03-28 (×3): 1500 mg via ORAL
  Filled 2016-03-27 (×7): qty 15

## 2016-03-27 NOTE — Progress Notes (Signed)
Left upper extremity with some serosanguinous drainage No skin compromise  Wound culture taken  Dr Bridgett Larsson following  Ruta Hinds, MD [facility] [place] Office: [phone] Pager: [phone]

## 2016-03-27 NOTE — Progress Notes (Signed)
   Daily Progress Note  S/p L 2nd BVT (02/12/16)  - seroma and hematoma are common complications of second stage transposition give the extensive dissection and mobilization needed - usually managed conservative with towel over arm and warm compresses to mobilize an hematoma - will be by tomorrow if still in hosp, otherwise can follow up in the office as prev arranged appt   Adele Barthel, MD [facility] [place] Office: [phone] Pager: [phone]  03/27/2016, 9:37 AM

## 2016-03-27 NOTE — Progress Notes (Addendum)
      Called to patient room secondary to bleeding. Medial incision left upper arm with SS drainage, firm on volar surface, softer on dorsum of left upper arm. Placed dry dressing over drainage site. Left hand N/V/M intact   S/P Left second stage basilic transposition 4/0/9735 Seroma/hematoma Nursing staff stated there was pure lent drainage initially.  I ordered culture tubes to be sent to the room for culture and sensitivity.  I talked to Dr. Oneida Alar and he will see the patient later today.  COLLINS, EMMA MAUREEN PA-C   Addendum  Pt had spontaneous drainage from of L arm hematoma.  No evidence of infection.  I doubt any wound cultures will be of any value given superficial flora contamination.  This is a known complication of the extensive dissection and mobilization needed for a second stage transposition.    - Wash left arm daily - Dry dressing daily and as needed - Expect wound to heal in the next 1-2 weeks  Adele Barthel, MD [facility] [place]: [phone] Pager: [phone]  03/27/2016, 7:07 PM

## 2016-03-27 NOTE — Progress Notes (Signed)
Patient called saying his arm was bleeding, went into the room, incision site where fistula was placed was bleeding and large amount of serosangeous fluid on patient gown and bed. Paged Dr. Bridgett Larsson. Held pressure and placed dry dressing on site. Will continue to monitor.  Cyndia Bent

## 2016-03-27 NOTE — Progress Notes (Addendum)
[name] Progress Note   Subjective: "My arm burst open and started draining blood and pus this morning". Removed drsg to exam AVF. Started to drain copious amount serosanguinous fld middle portion of AVF. Pressure held until drainage ceased. Still has bruit/thrill. No other issues.  Says that liquid CaCO3 is better than TUMS. Denies pain.   Objective Vitals:   03/26/16 1451 03/26/16 2046 03/27/16 0554 03/27/16 1000  BP: (!) 208/119 (!) 180/160 (!) 176/117 136/88  Pulse: 84 93 80 84  Resp: 16 18 18 20   Temp: 98.1 F (36.7 C) 98.9 F (37.2 C) 98.9 F (37.2 C) 98.3 F (36.8 C)  TempSrc: Oral Oral Oral Oral  SpO2: 100% 100% 100% 100%  Weight: 98.2 kg (216 lb 7.9 oz)     Height:  5\' 11"  (1.803 m)     Physical Exam General: pleasant, NAD. HEENT: Steri-strips intact neck. No swelling.  Heart: S1, S2, RRR Lungs:Bilateral breath sounds CTA A/P Abdomen: soft, nontender.active BS Extremities: Steri-strips intact RFA. No LE edema.  Dialysis Access: RIJ [facility] drsg CDI. LUA AVF + bruit. Draining serosanguinous fld mid fistula. VVS aware and at bedside.  D[facility]  4 hr 400/800 2.0K/2.0 Ca  102kg  Heparin 5000 units IV q tx  R IJ cath/ LUA AVF+ No ESA Venofer 50 mg IV weekly   Recent labs: hgb 11.7 24% sat iPTH 3194 8/24 - on renvela 3 sensipar 90 bid; lisinopril 20 bid (per med list but he is taking only 20)and amlodpine 10 added 9/7 but not picked up Rx yet  Additional Objective Labs: Basic Metabolic Panel:  Recent Labs Lab 03/26/16 0326 03/26/16 1547 03/27/16 0703  NA 135 135 133*  K 4.2 3.6 4.0  CL 93* 96* 96*  CO2 25 25 22   GLUCOSE 90 78 123*  BUN 48* 19 26*  CREATININE 14.34* 7.52* 9.78*  CALCIUM 6.4* 8.7* 7.3*  PHOS 5.3* 3.6 3.8   Liver Function Tests:  Recent Labs Lab 03/26/16 0326 03/26/16 1547 03/27/16 0703  ALBUMIN 2.7* 3.0* 2.8*   No results for input(s): LIPASE, AMYLASE in the last 168 hours. CBC:  Recent  Labs Lab 03/22/16 0758 03/23/16 0742 03/26/16 1629  WBC  --  7.6 5.4  HGB 12.6* 11.7* 11.8*  HCT 37.0* 36.4* 36.3*  MCV  --  94.1 92.1  PLT  --  169 228   Blood Culture    Component Value Date/Time   SDES BLOOD BLOOD RIGHT HAND 08/18/2015 1043   SPECREQUEST IN PEDIATRIC BOTTLE 2CC 08/18/2015 1043   CULT NO GROWTH 5 DAYS 08/18/2015 1043   REPTSTATUS 08/23/2015 FINAL 08/18/2015 1043    Medications: . sodium chloride 25 mL/hr (03/22/16 0755)  . sodium chloride     . amLODipine  10 mg Oral Daily  . calcium carbonate (dosed in mg elemental calcium)  1,000 mg of elemental calcium Oral TID BM  . doxercalciferol  12 mcg Intravenous Q T,Th,Sa-HD  . ferric gluconate (FERRLECIT/NULECIT) IV  62.5 mg Intravenous Q Tue-HD  . ibuprofen  400 mg Oral BH-q7a  . lisinopril  20 mg Oral Daily  . multivitamin  1 tablet Oral QHS     Assessment/Plan: 1. 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 Ca down to  6.3-6.4. Changed to liquid CaCO3 one gm elemental Ca+ tid between meals (total 3  gm/d), hectorol 12 ug tiw w HD, and high Ca+ bath.  Last Ca 7.3. OK for discharge from surgical standpoint. Will increase Ca to 4.5 gm / day elemental.  Cont high dose hectorol.  2. [facility]- [facility]. HD yesterday. Next HD tomorrow. 3.5 Ca bath. No heparin.  3. Hypertension/volume-recent BP high - norvasc added to lisinopril. Patient says he started taking norvasc next day after he rec'd Rx. HD yesterday pre wt 100.5 kg.  Net UF 1973. Post wt 98.2 kg. Was extremely hypertensive at end of tx. BP 208/119. Rec'd 2 doses of hydralazine and was given lisinopril at end of tx. Consider increasing Lisinopril to 40 mg PO.  Better control of BP today but still high. Lower EDW when dc'd.  4. Anemia-HGB 11.8 No OP ESA. Follow  HGB 5. Nutrition- Albumin 2.8 renal diet/nepro/renal vits           6.   Vascular access - LUA AVF 1st stage BVT 12/27/15, 2nd stage 02/12/16. Was noted to have increased swelling over AVF area. Was seen by VVS yesterday-thought to be either seroma vs hematoma. Earlier this AM started to drain pus and blood, then serosanguinous  Drainage during exam. Gerri Lins, PAC at bedside and aware of situation. Will culture drainage. VVS following.    Disposition: Stable for DC per Dr. Harlow Asa. Will await in put from VVS as to plan for AVF. Would keep in hospital until serum Ca levels stable (> 7 between HD sessions) on oral Ca and vit D w HD. Getting close to dc.  Rita H. Brown NP-C 03/27/2016, 11:37 AM  [name] [phone]  Pt seen, examined, agree w assess/plan as above with additions as indicated.  Kelly Splinter MD [employer] pager [phone]    cell [phone] 03/27/2016, 12:13 PM

## 2016-03-27 NOTE — Progress Notes (Signed)
  General Surgery [facility]  Assessment & Plan: Status post neck exploration and parathyroidectomy with autotransplantation Stable from surgical standpoint - wounds clear and dry and intact, pain controlled Apparent issue with fistula LUE - will notify vascular surgery to evaluate Home when stable from renal standpoint and can be managed in out-patient dialysis  Subjective: POD#5 - patient in bed, comfortable, eating, voice normal  Objective: Vital signs in last 24 hours: Temp:  [97.5 F (36.4 C)-98.9 F (37.2 C)] 98.9 F (37.2 C) (09/20 0554) Pulse Rate:  [73-93] 80 (09/20 0554) Resp:  [16-20] 18 (09/20 0554) BP: (171-230)/(89-160) 176/117 (09/20 0554) SpO2:  [100 %] 100 % (09/20 0554) Weight:  [98.2 kg (216 lb 7.9 oz)-100.5 kg (221 lb 9 oz)] 98.2 kg (216 lb 7.9 oz) (09/19 1451) Last BM Date: 03/23/16  Intake/Output from previous day: 09/19 0701 - 09/20 0700 In: 3376.3 [P.O.:600; I.V.:2571.3; IV Piggyback:205] Out: 1975 [Urine:1; Stool:1] Intake/Output this shift: No intake/output data recorded.  Physical Exam: HEENT - sclerae clear, mucous membranes moist Neck - soft, mild STS Ext - wound right forearm CDI; left upper arm with STS, tenderness Neuro - alert & oriented, no focal deficits  Lab Results:   Recent Labs  03/26/16 1629  WBC 5.4  HGB 11.8*  HCT 36.3*  PLT 228   BMET  Recent Labs  03/26/16 0326 03/26/16 1547  NA 135 135  K 4.2 3.6  CL 93* 96*  CO2 25 25  GLUCOSE 90 78  BUN 48* 19  CREATININE 14.34* 7.52*  CALCIUM 6.4* 8.7*   PT/INR No results for input(s): LABPROT, INR in the last 72 hours. Comprehensive Metabolic Panel:    Component Value Date/Time   NA 135 03/26/2016 1547   NA 135 03/26/2016 0326   K 3.6 03/26/2016 1547   K 4.2 03/26/2016 0326   CL 96 (L) 03/26/2016 1547   CL 93 (L) 03/26/2016 0326   CO2 25 03/26/2016 1547   CO2 25 03/26/2016 0326   BUN 19 03/26/2016 1547    BUN 48 (H) 03/26/2016 0326   CREATININE 7.52 (H) 03/26/2016 1547   CREATININE 14.34 (H) 03/26/2016 0326   GLUCOSE 78 03/26/2016 1547   GLUCOSE 90 03/26/2016 0326   CALCIUM 8.7 (L) 03/26/2016 1547   CALCIUM 6.4 (LL) 03/26/2016 0326   AST 14 (L) 04/06/2015 0515   AST 13 06/13/2014 1036   ALT 9 (L) 04/06/2015 0515   ALT 7 06/13/2014 1036   ALKPHOS 54 04/06/2015 0515   ALKPHOS 84 06/13/2014 1036   BILITOT 1.5 (H) 04/06/2015 0515   BILITOT 0.4 06/13/2014 1036   PROT 6.8 04/06/2015 0515   PROT 7.7 06/13/2014 1036   ALBUMIN 3.0 (L) 03/26/2016 1547   ALBUMIN 2.7 (L) 03/26/2016 0326    Studies/Results: No results found.  Assessment & Plan:      Refer to beginning of this note.  [name], MD, [facility] Office: [place] 03/27/2016  Patient ID: Timothy Palmer, male   DOB: [birth year], 50 y.o.   MRN: [id number]

## 2016-03-28 LAB — RENAL FUNCTION PANEL
ALBUMIN: 2.8 g/dL — AB (ref 3.5–5.0)
ANION GAP: 16 — AB (ref 5–15)
BUN: 36 mg/dL — ABNORMAL HIGH (ref 6–20)
CALCIUM: 7 mg/dL — AB (ref 8.9–10.3)
CO2: 24 mmol/L (ref 22–32)
Chloride: 94 mmol/L — ABNORMAL LOW (ref 101–111)
Creatinine, Ser: 12.02 mg/dL — ABNORMAL HIGH (ref 0.61–1.24)
GFR calc non Af Amer: 4 mL/min — ABNORMAL LOW (ref >60)
GFR, EST AFRICAN AMERICAN: 5 mL/min — AB (ref >60)
Glucose, Bld: 85 mg/dL (ref 65–99)
PHOSPHORUS: 4.6 mg/dL (ref 2.5–4.6)
POTASSIUM: 4.6 mmol/L (ref 3.5–5.1)
SODIUM: 134 mmol/L — AB (ref 135–145)

## 2016-03-28 LAB — CBC
HCT: 37 % — ABNORMAL LOW (ref 39.0–52.0)
HEMOGLOBIN: 12 g/dL — AB (ref 13.0–17.0)
MCH: 29.9 pg (ref 26.0–34.0)
MCHC: 32.4 g/dL (ref 30.0–36.0)
MCV: 92 fL (ref 78.0–100.0)
Platelets: 248 10*3/uL (ref 150–400)
RBC: 4.02 MIL/uL — AB (ref 4.22–5.81)
RDW: 18 % — ABNORMAL HIGH (ref 11.5–15.5)
WBC: 6.8 10*3/uL (ref 4.0–10.5)

## 2016-03-28 MED ORDER — DOXERCALCIFEROL 4 MCG/2ML IV SOLN
INTRAVENOUS | Status: AC
  Filled 2016-03-28: qty 6

## 2016-03-28 MED FILL — HYDROCODON-APAP 5-325: 5-325 | 2 days supply | Qty: 20 | Fill #0

## 2016-03-28 NOTE — Discharge Summary (Signed)
Physician Discharge Summary [facility]  Patient ID: Timothy Palmer MRN: [id number] DOB/AGE: 12-02-1965 50 y.o.  Admit date: 03/22/2016 Discharge date: 03/28/2016  Admission Diagnoses:  Secondary hyperparathyroidism, ESRD  Discharge Diagnoses:  Principal Problem:   Hyperparathyroidism, secondary ([place]) Active Problems:   End stage renal disease ([place])   Secondary hyperparathyroidism of renal origin [facility])   Discharged Condition: good  Hospital Course: patient admitted following parathyroidectomy with autotransplantation to forearm.  Significant post op hypocalcemia.  Patient managed post op by nephrology with medication changes and hemodialysis.  Drainage from left upper extremity fistula evaluated and managed by vascular surgery.  By POD#6, calcium levels have improve to >7 and patient ready for discharge home and out-patient management.  Discussed with Dr. Jonnie Finner this morning in HD.  Consults: nephrology , vascular surgery  Treatments: surgery: total parathyroidectomy (2 glands) with autotransplantation to right forearm  Discharge Exam: Blood pressure (!) 177/94, pulse 88, temperature 97.8 F (36.6 C), temperature source Oral, resp. rate 17, height 5\' 11"  (1.803 m), weight 96.3 kg (212 lb 4.9 oz), SpO2 99 %. HEENT - clear Neck - wound dry and intact; voice normal Ext - wound right forearm dry and intact; left upper arm with dry dressing  Disposition: Home  Discharge Instructions    Discharge instructions    Complete by:  As directed    [facility]  THYROID & PARATHYROID SURGERY:  POST-OP INSTRUCTIONS  Always review your discharge instruction sheet from the facility where your surgery was performed.  A prescription for pain medication may be given to you upon discharge.  Take your pain medication as prescribed.  If narcotic pain medicine is not needed, then you may take acetaminophen (Tylenol) or ibuprofen (Advil) as needed.  Take your  usually prescribed medications unless otherwise directed.  If you need a refill on your pain medication, please contact your pharmacy. They will contact our office to request authorization.  Prescriptions will not be processed by our office after 5 pm or on weekends.  Start with a light diet upon arrival home, such as soup and crackers or toast.  Be sure to drink plenty of fluids daily.  Resume your normal diet the day after surgery.  Most patients will experience some swelling and bruising on the chest and neck area.  Ice packs will help.  Swelling and bruising can take several days to resolve.   It is common to experience some constipation after surgery.  Increasing fluid intake and taking a stool softener will usually help or prevent this problem.  A mild laxative (Milk of Magnesia or Miralax) should be taken according to package directions if there has been no bowel movement after 48 hours.  You have steri-strips and a gauze dressing over your incision.  You may remove the gauze bandage on the second day after surgery, and you may shower at that time.  Leave your steri-strips (small skin tapes) in place directly over the incision.  These strips should remain on the skin for 5-7 days and then be removed.  You may get them wet in the shower and pat them dry.  You may resume regular (light) daily activities beginning the next day - such as daily self-care, walking, climbing stairs - gradually increasing activities as tolerated.  You may have sexual intercourse when it is comfortable.  Refrain from any heavy lifting or straining until approved by your doctor.  You may drive when you no longer are taking prescription pain medication, you  can comfortably wear a seatbelt, and you can safely maneuver your car and apply brakes.  You should see your doctor in the office for a follow-up appointment approximately two to three weeks after your surgery.  Make sure that you call for this appointment within a day  or two after you arrive home to insure a convenient appointment time.  WHEN TO CALL YOUR DOCTOR: -- Fever greater than 101.5 -- Inability to urinate -- Nausea and/or vomiting - persistent -- Extreme swelling or bruising -- Continued bleeding from incision -- Increased pain, redness, or drainage from the incision -- Difficulty swallowing or breathing -- Muscle cramping or spasms -- Numbness or tingling in hands or around lips  The clinic staff is available to answer your questions during regular business hours.  Please don't hesitate to call and ask to speak to one of the nurses if you have concerns.  [name], MD, [facility] Office: [phone]  Website: [email or web address]   Increase activity slowly    Complete by:  As directed    No dressing needed    Complete by:  As directed        Medication List    TAKE these medications   aspirin 81 MG tablet Take 81 mg by mouth daily.   feeding supplement (NEPRO CARB STEADY) Liqd Take 237 mLs by mouth 2 (two) times daily between meals. What changed:  when to take this   HYDROcodone-acetaminophen 5-325 MG tablet Commonly known as:  NORCO/VICODIN Take 1-2 tablets by mouth every 4 (four) hours as needed for moderate pain.   ibuprofen 200 MG tablet Commonly known as:  ADVIL,MOTRIN Take 400 mg by mouth every morning.   lisinopril 20 MG tablet Commonly known as:  PRINIVIL,ZESTRIL Take 20 mg by mouth daily.   multivitamin Tabs tablet Take 1 tablet by mouth at bedtime. What changed:  when to take this   oxyCODONE-acetaminophen 5-325 MG tablet Commonly known as:  ROXICET Take 1-2 tablets by mouth every 6 (six) hours as needed for moderate pain.   sevelamer carbonate 800 MG tablet Commonly known as:  RENVELA Take 4,000 mg by mouth 3 (three) times daily with meals.      Follow-up Information    Althia Egolf M, MD. Schedule an appointment as soon as  possible for a visit in 3 week(s).   Specialty:  General Surgery Contact information: [address]  [place] [address] [phone]           [name], MD, [facility] Office: [phone]   Signed: [name] 03/28/2016, 11:18 AM

## 2016-03-28 NOTE — Progress Notes (Signed)
[name] Progress Note   Subjective: "My arm (L AVF) hurts, I'm trying not to move it. It's bled through the bandage." On Hemodialysis, tolerating well other than soreness LUA.   Objective Vitals:   03/28/16 0700 03/28/16 0730 03/28/16 0800 03/28/16 0830  BP: 136/85 (!) 134/96 (!) 134/96 (!) 151/95  Pulse: 74 73 70 79  Resp: 16     Temp:      TempSrc:      SpO2:      Weight:      Height:       Physical Exam General: pleasant, NAD. HEENT: Steri-strips intact neck. No swelling.  Heart: S1, S2, 1/6 systolic M. 3rd ICS LSB. RRR Lungs:Bilateral breath sounds CTA A/P Abdomen: soft, nontender.active BS Extremities: Steri-strips intact RFA. No LE edema.  Dialysis Access: RIJ T[facility] to HD lines, drsg CDI. LUA AVF + bruit. Draining serosanguinous fld, drsg soiled with fresh drainage.  D[facility]  4 hr 400/800 2.0K/2.0 Ca  102kg  Heparin 5000 units IV q tx  R IJ cath/ LUA AVF+ No ESA Venofer 50 mg IV weekly  Recent labs: hgb 11.7 24% sat iPTH 3194 8/24 - on renvela 3 sensipar 90 bid; lisinopril 20 bid (per med list but he is taking only 20)and amlodpine 10 added 03/14/16  Note: patient says he DID pick up amlodipine next day after he rec'd Rx. Per med history on ecube, Amlodipine was filled 03/14/16  Additional Objective Labs: Basic Metabolic Panel:  Recent Labs Lab 03/27/16 0703 03/27/16 1612 03/28/16 0716  NA 133* 135 134*  K 4.0 4.3 4.6  CL 96* 95* 94*  CO2 22 27 24   GLUCOSE 123* 87 85  BUN 26* 29* 36*  CREATININE 9.78* 10.76* 12.02*  CALCIUM 7.3* 7.4* 7.0*  PHOS 3.8 4.3 4.6   Liver Function Tests:  Recent Labs Lab 03/27/16 0703 03/27/16 1612 03/28/16 0716  ALBUMIN 2.8* 3.0* 2.8*   No results for input(s): LIPASE, AMYLASE in the last 168 hours. CBC:  Recent Labs Lab 03/23/16 0742 03/26/16 1629 03/28/16 0717  WBC 7.6 5.4 6.8  HGB 11.7* 11.8* 12.0*  HCT 36.4* 36.3* 37.0*  MCV 94.1 92.1 92.0  PLT 169 228 248   Blood  Culture    Component Value Date/Time   SDES BLOOD BLOOD RIGHT HAND 08/18/2015 1043   SPECREQUEST IN PEDIATRIC BOTTLE 2CC 08/18/2015 1043   CULT NO GROWTH 5 DAYS 08/18/2015 1043   REPTSTATUS 08/23/2015 FINAL 08/18/2015 1043    Cardiac Enzymes: No results for input(s): CKTOTAL, CKMB, CKMBINDEX, TROPONINI in the last 168 hours. CBG: No results for input(s): GLUCAP in the last 168 hours. Iron Studies: No results for input(s): IRON, TIBC, TRANSFERRIN, FERRITIN in the last 72 hours. @lablastinr3 @ Studies/Results: No results found. Medications: . sodium chloride 25 mL/hr (03/22/16 0755)  . sodium chloride     . amLODipine  10 mg Oral Daily  . calcium carbonate (dosed in mg elemental calcium)  1,500 mg of elemental calcium Oral TID WC  . doxercalciferol      . doxercalciferol  12 mcg Intravenous Q T,Th,Sa-HD  . ferric gluconate (FERRLECIT/NULECIT) IV  62.5 mg Intravenous Q Tue-HD  . ibuprofen  400 mg Oral BH-q7a  . lisinopril  20 mg Oral Daily  . 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 At dc will continue po Ca at 4.5 gm elemental per day w a liquid prep, also high-dose Hectorol w HD and high Ca bath for now w HD. OK for dc today.  2. [facility]. HD today 3.5 Ca bath. No heparin.  3.Hypertension/volume-recent BP high - norvasc added to lisinopril. Patient says he started taking norvasc next day after he rec'd Rx. Pre wt today 99.3 UFG 3500. Better control of BP.  Lower EDW when dc'd.  4. Anemia-HGB 12.0 No OP ESA. Follow HGB 5. Nutrition- Albumin 2.8 renal diet/nepro/renal vits  6. Vascular access - LUA AVF 1st stage BVT 12/27/15, 2nd stage 02/12/16. Was noted to have increased swelling over AVF area. Was seen by VVS  yesterday-thought to be either seroma vs hematoma. 03/27/16 started to drain pus and blood, then serosanguinous drainage during exam. Culture of drainage in progress. Continue to drain serosanguinous, leaked through drsg.   Rita H. Brown NP-C 03/28/2016, 9:09 AM  [name] [phone]  Pt seen, examined, agree w assess/plan as above with additions as indicated.  Kelly Splinter MD [employer] pager [phone]    cell [phone] 03/28/2016, 12:52 PM

## 2016-03-28 NOTE — Progress Notes (Signed)
  [facility] Progress Note  Subjective    No complaints.   Objective Vitals:   03/28/16 1030 03/28/16 1059  BP: (!) 175/101 (!) 177/94  Pulse: 87 88  Resp:  17  Temp:  97.8 F (36.6 C)    Intake/Output Summary (Last 24 hours) at 03/28/16 1150 Last data filed at 03/28/16 1059  Gross per 24 hour  Intake              720 ml  Output             3000 ml  Net            -2280 ml    Left upper arm hematoma decompressed. Mild serosanguinous drainage from mid upper arm opening. No erythema. No purulence.   Assessment/Planning: 50 y.o. male with seroma/hematoma left upper arm s/p left second stage basilic vein transposition 02/12/2016  Wound continues to have serosanguinous drainage from decompression of seroma/hematoma. Does not appear infected.  Cultures pending.  Ok to d/c. Continue dressing changes at home.   Alvia Grove 03/28/2016 11:50 AM --  Laboratory CBC    Component Value Date/Time   WBC 6.8 03/28/2016 0717   HGB 12.0 (L) 03/28/2016 0717   HCT 37.0 (L) 03/28/2016 0717   PLT 248 03/28/2016 0717    BMET    Component Value Date/Time   NA 134 (L) 03/28/2016 0716   K 4.6 03/28/2016 0716   CL 94 (L) 03/28/2016 0716   CO2 24 03/28/2016 0716   GLUCOSE 85 03/28/2016 0716   BUN 36 (H) 03/28/2016 0716   CREATININE 12.02 (H) 03/28/2016 0716   CALCIUM 7.0 (L) 03/28/2016 0716   GFRNONAA 4 (L) 03/28/2016 0716   GFRAA 5 (L) 03/28/2016 0716    COAG Lab Results  Component Value Date   INR 1.0 05/04/2015   INR 2.27 (H) 04/14/2015   INR 2.15 (H) 04/13/2015   No results found for: PTT  Antibiotics Anti-infectives    Start     Dose/Rate Route Frequency Ordered Stop   03/22/16 0830  ceFAZolin (ANCEF) IVPB 2g/100 mL premix     2 g 200 mL/hr over 30 Minutes Intravenous To ShortStay Surgical 03/21/16 1400 03/22/16 [place], PA-C [facility] Office: [phone] Pager: [phone] 03/28/2016 11:50  AM

## 2016-03-28 NOTE — Progress Notes (Signed)
Discharge instructions and medications reviewed with patient; all questions answered. Printed prescription given to patient.

## 2016-03-30 DIAGNOSIS — D631 Anemia in chronic kidney disease: Secondary | ICD-10-CM | POA: Diagnosis not present

## 2016-03-30 DIAGNOSIS — N186 End stage renal disease: Secondary | ICD-10-CM | POA: Diagnosis not present

## 2016-03-30 DIAGNOSIS — E213 Hyperparathyroidism, unspecified: Secondary | ICD-10-CM | POA: Diagnosis not present

## 2016-03-30 DIAGNOSIS — D509 Iron deficiency anemia, unspecified: Secondary | ICD-10-CM | POA: Diagnosis not present

## 2016-04-01 ENCOUNTER — Ambulatory Visit: Payer: Medicare Other | Admitting: Thoracic Surgery (Cardiothoracic Vascular Surgery)

## 2016-04-01 LAB — ANAEROBIC CULTURE

## 2016-04-02 DIAGNOSIS — D509 Iron deficiency anemia, unspecified: Secondary | ICD-10-CM | POA: Diagnosis not present

## 2016-04-02 DIAGNOSIS — E213 Hyperparathyroidism, unspecified: Secondary | ICD-10-CM | POA: Diagnosis not present

## 2016-04-02 DIAGNOSIS — N186 End stage renal disease: Secondary | ICD-10-CM | POA: Diagnosis not present

## 2016-04-02 DIAGNOSIS — D631 Anemia in chronic kidney disease: Secondary | ICD-10-CM | POA: Diagnosis not present

## 2016-04-04 DIAGNOSIS — N186 End stage renal disease: Secondary | ICD-10-CM | POA: Diagnosis not present

## 2016-04-04 DIAGNOSIS — D631 Anemia in chronic kidney disease: Secondary | ICD-10-CM | POA: Diagnosis not present

## 2016-04-04 DIAGNOSIS — E213 Hyperparathyroidism, unspecified: Secondary | ICD-10-CM | POA: Diagnosis not present

## 2016-04-04 DIAGNOSIS — Z5181 Encounter for therapeutic drug level monitoring: Secondary | ICD-10-CM | POA: Diagnosis not present

## 2016-04-04 DIAGNOSIS — Z7901 Long term (current) use of anticoagulants: Secondary | ICD-10-CM | POA: Diagnosis not present

## 2016-04-04 DIAGNOSIS — I481 Persistent atrial fibrillation: Secondary | ICD-10-CM | POA: Diagnosis not present

## 2016-04-04 DIAGNOSIS — D509 Iron deficiency anemia, unspecified: Secondary | ICD-10-CM | POA: Diagnosis not present

## 2016-04-06 DIAGNOSIS — Z992 Dependence on renal dialysis: Secondary | ICD-10-CM | POA: Diagnosis not present

## 2016-04-06 DIAGNOSIS — N186 End stage renal disease: Secondary | ICD-10-CM | POA: Diagnosis not present

## 2016-04-06 DIAGNOSIS — D631 Anemia in chronic kidney disease: Secondary | ICD-10-CM | POA: Diagnosis not present

## 2016-04-06 DIAGNOSIS — E213 Hyperparathyroidism, unspecified: Secondary | ICD-10-CM | POA: Diagnosis not present

## 2016-04-06 DIAGNOSIS — I120 Hypertensive chronic kidney disease with stage 5 chronic kidney disease or end stage renal disease: Secondary | ICD-10-CM | POA: Diagnosis not present

## 2016-04-06 DIAGNOSIS — D509 Iron deficiency anemia, unspecified: Secondary | ICD-10-CM | POA: Diagnosis not present

## 2016-04-09 DIAGNOSIS — D509 Iron deficiency anemia, unspecified: Secondary | ICD-10-CM | POA: Diagnosis not present

## 2016-04-09 DIAGNOSIS — E213 Hyperparathyroidism, unspecified: Secondary | ICD-10-CM | POA: Diagnosis not present

## 2016-04-09 DIAGNOSIS — N186 End stage renal disease: Secondary | ICD-10-CM | POA: Diagnosis not present

## 2016-04-10 ENCOUNTER — Ambulatory Visit: Payer: Medicare Other | Admitting: Family

## 2016-04-10 ENCOUNTER — Other Ambulatory Visit (HOSPITAL_COMMUNITY): Payer: Medicare Other

## 2016-04-10 ENCOUNTER — Encounter (HOSPITAL_COMMUNITY): Payer: Medicare Other

## 2016-04-11 DIAGNOSIS — E213 Hyperparathyroidism, unspecified: Secondary | ICD-10-CM | POA: Diagnosis not present

## 2016-04-11 DIAGNOSIS — N186 End stage renal disease: Secondary | ICD-10-CM | POA: Diagnosis not present

## 2016-04-11 DIAGNOSIS — D509 Iron deficiency anemia, unspecified: Secondary | ICD-10-CM | POA: Diagnosis not present

## 2016-04-13 DIAGNOSIS — E213 Hyperparathyroidism, unspecified: Secondary | ICD-10-CM | POA: Diagnosis not present

## 2016-04-13 DIAGNOSIS — D509 Iron deficiency anemia, unspecified: Secondary | ICD-10-CM | POA: Diagnosis not present

## 2016-04-13 DIAGNOSIS — N186 End stage renal disease: Secondary | ICD-10-CM | POA: Diagnosis not present

## 2016-04-16 DIAGNOSIS — D509 Iron deficiency anemia, unspecified: Secondary | ICD-10-CM | POA: Diagnosis not present

## 2016-04-16 DIAGNOSIS — N186 End stage renal disease: Secondary | ICD-10-CM | POA: Diagnosis not present

## 2016-04-16 DIAGNOSIS — E213 Hyperparathyroidism, unspecified: Secondary | ICD-10-CM | POA: Diagnosis not present

## 2016-04-17 ENCOUNTER — Other Ambulatory Visit: Payer: Medicare Other

## 2016-04-18 ENCOUNTER — Encounter: Payer: Self-pay | Admitting: Vascular Surgery

## 2016-04-18 DIAGNOSIS — N186 End stage renal disease: Secondary | ICD-10-CM | POA: Diagnosis not present

## 2016-04-18 DIAGNOSIS — E213 Hyperparathyroidism, unspecified: Secondary | ICD-10-CM | POA: Diagnosis not present

## 2016-04-18 DIAGNOSIS — D509 Iron deficiency anemia, unspecified: Secondary | ICD-10-CM | POA: Diagnosis not present

## 2016-04-20 DIAGNOSIS — D509 Iron deficiency anemia, unspecified: Secondary | ICD-10-CM | POA: Diagnosis not present

## 2016-04-20 DIAGNOSIS — E213 Hyperparathyroidism, unspecified: Secondary | ICD-10-CM | POA: Diagnosis not present

## 2016-04-20 DIAGNOSIS — N186 End stage renal disease: Secondary | ICD-10-CM | POA: Diagnosis not present

## 2016-04-23 ENCOUNTER — Encounter (HOSPITAL_COMMUNITY): Payer: Medicare Other

## 2016-04-23 ENCOUNTER — Ambulatory Visit: Payer: Medicare Other | Admitting: Vascular Surgery

## 2016-04-23 ENCOUNTER — Other Ambulatory Visit (HOSPITAL_COMMUNITY): Payer: Medicare Other

## 2016-04-23 DIAGNOSIS — N186 End stage renal disease: Secondary | ICD-10-CM | POA: Diagnosis not present

## 2016-04-23 DIAGNOSIS — E213 Hyperparathyroidism, unspecified: Secondary | ICD-10-CM | POA: Diagnosis not present

## 2016-04-23 DIAGNOSIS — D509 Iron deficiency anemia, unspecified: Secondary | ICD-10-CM | POA: Diagnosis not present

## 2016-04-25 DIAGNOSIS — N186 End stage renal disease: Secondary | ICD-10-CM | POA: Diagnosis not present

## 2016-04-25 DIAGNOSIS — E213 Hyperparathyroidism, unspecified: Secondary | ICD-10-CM | POA: Diagnosis not present

## 2016-04-25 DIAGNOSIS — D509 Iron deficiency anemia, unspecified: Secondary | ICD-10-CM | POA: Diagnosis not present

## 2016-04-27 DIAGNOSIS — E213 Hyperparathyroidism, unspecified: Secondary | ICD-10-CM | POA: Diagnosis not present

## 2016-04-27 DIAGNOSIS — D509 Iron deficiency anemia, unspecified: Secondary | ICD-10-CM | POA: Diagnosis not present

## 2016-04-27 DIAGNOSIS — N186 End stage renal disease: Secondary | ICD-10-CM | POA: Diagnosis not present

## 2016-04-30 DIAGNOSIS — N186 End stage renal disease: Secondary | ICD-10-CM | POA: Diagnosis not present

## 2016-04-30 DIAGNOSIS — E213 Hyperparathyroidism, unspecified: Secondary | ICD-10-CM | POA: Diagnosis not present

## 2016-04-30 DIAGNOSIS — D509 Iron deficiency anemia, unspecified: Secondary | ICD-10-CM | POA: Diagnosis not present

## 2016-05-02 ENCOUNTER — Encounter: Payer: Self-pay | Admitting: Vascular Surgery

## 2016-05-02 DIAGNOSIS — N186 End stage renal disease: Secondary | ICD-10-CM | POA: Diagnosis not present

## 2016-05-02 DIAGNOSIS — D509 Iron deficiency anemia, unspecified: Secondary | ICD-10-CM | POA: Diagnosis not present

## 2016-05-02 DIAGNOSIS — E213 Hyperparathyroidism, unspecified: Secondary | ICD-10-CM | POA: Diagnosis not present

## 2016-05-02 DIAGNOSIS — E1129 Type 2 diabetes mellitus with other diabetic kidney complication: Secondary | ICD-10-CM | POA: Diagnosis not present

## 2016-05-04 DIAGNOSIS — N186 End stage renal disease: Secondary | ICD-10-CM | POA: Diagnosis not present

## 2016-05-04 DIAGNOSIS — E213 Hyperparathyroidism, unspecified: Secondary | ICD-10-CM | POA: Diagnosis not present

## 2016-05-04 DIAGNOSIS — D509 Iron deficiency anemia, unspecified: Secondary | ICD-10-CM | POA: Diagnosis not present

## 2016-05-07 DIAGNOSIS — N186 End stage renal disease: Secondary | ICD-10-CM | POA: Diagnosis not present

## 2016-05-07 DIAGNOSIS — E213 Hyperparathyroidism, unspecified: Secondary | ICD-10-CM | POA: Diagnosis not present

## 2016-05-07 DIAGNOSIS — I120 Hypertensive chronic kidney disease with stage 5 chronic kidney disease or end stage renal disease: Secondary | ICD-10-CM | POA: Diagnosis not present

## 2016-05-07 DIAGNOSIS — Z992 Dependence on renal dialysis: Secondary | ICD-10-CM | POA: Diagnosis not present

## 2016-05-07 DIAGNOSIS — D509 Iron deficiency anemia, unspecified: Secondary | ICD-10-CM | POA: Diagnosis not present

## 2016-05-07 NOTE — Progress Notes (Signed)
    Postoperative Access Visit   History of Present Illness  Timothy Palmer is a 50 y.o. year old male who presents for postoperative follow-up for: L 2nd BVT (Date: 02/12/16).  The patient's wounds are healed.  The patient notes no steal symptoms.  The patient is able to complete their activities of daily living.  The patient's current symptoms are: none.  For VQI Use Only  PRE-ADM LIVING: Home  AMB STATUS: Ambulatory  Physical Examination Vitals:   05/08/16 1025  BP: 106/74  Pulse: 84  Resp: 18  Temp: 97.8 F (36.6 C)    LUE: Incision is healed, skin feels warm, hand grip is 5/5, sensation in digits is intact, palpable thrill, bruit can be auscultated   Medical Decision Making  Timothy Palmer is a 50 y.o. year old male who presents s/p L 2nd BVT.   The patient's access is ready for use.  The patient's tunneled dialysis catheter can be removed after two successful cannulations and completed dialysis treatments.  Thank you for allowing [place] to participate in this patient's care.  Adele Barthel, MD, FACS [facility] [place] Office: [phone] Pager: [phone]

## 2016-05-08 ENCOUNTER — Encounter: Payer: Self-pay | Admitting: Vascular Surgery

## 2016-05-08 ENCOUNTER — Ambulatory Visit (INDEPENDENT_AMBULATORY_CARE_PROVIDER_SITE_OTHER): Payer: Medicare Other | Admitting: Vascular Surgery

## 2016-05-08 VITALS — BP 106/74 | HR 84 | Temp 97.8°F | Resp 18 | Ht 71.0 in | Wt 217.6 lb

## 2016-05-08 DIAGNOSIS — N186 End stage renal disease: Secondary | ICD-10-CM

## 2016-05-08 DIAGNOSIS — Z992 Dependence on renal dialysis: Secondary | ICD-10-CM

## 2016-05-09 DIAGNOSIS — E213 Hyperparathyroidism, unspecified: Secondary | ICD-10-CM | POA: Diagnosis not present

## 2016-05-09 DIAGNOSIS — D509 Iron deficiency anemia, unspecified: Secondary | ICD-10-CM | POA: Diagnosis not present

## 2016-05-09 DIAGNOSIS — N186 End stage renal disease: Secondary | ICD-10-CM | POA: Diagnosis not present

## 2016-05-11 DIAGNOSIS — E213 Hyperparathyroidism, unspecified: Secondary | ICD-10-CM | POA: Diagnosis not present

## 2016-05-11 DIAGNOSIS — N186 End stage renal disease: Secondary | ICD-10-CM | POA: Diagnosis not present

## 2016-05-11 DIAGNOSIS — D509 Iron deficiency anemia, unspecified: Secondary | ICD-10-CM | POA: Diagnosis not present

## 2016-05-14 DIAGNOSIS — E213 Hyperparathyroidism, unspecified: Secondary | ICD-10-CM | POA: Diagnosis not present

## 2016-05-14 DIAGNOSIS — N186 End stage renal disease: Secondary | ICD-10-CM | POA: Diagnosis not present

## 2016-05-14 DIAGNOSIS — D509 Iron deficiency anemia, unspecified: Secondary | ICD-10-CM | POA: Diagnosis not present

## 2016-05-16 DIAGNOSIS — N186 End stage renal disease: Secondary | ICD-10-CM | POA: Diagnosis not present

## 2016-05-16 DIAGNOSIS — D509 Iron deficiency anemia, unspecified: Secondary | ICD-10-CM | POA: Diagnosis not present

## 2016-05-16 DIAGNOSIS — E213 Hyperparathyroidism, unspecified: Secondary | ICD-10-CM | POA: Diagnosis not present

## 2016-05-18 DIAGNOSIS — N186 End stage renal disease: Secondary | ICD-10-CM | POA: Diagnosis not present

## 2016-05-18 DIAGNOSIS — E213 Hyperparathyroidism, unspecified: Secondary | ICD-10-CM | POA: Diagnosis not present

## 2016-05-18 DIAGNOSIS — D509 Iron deficiency anemia, unspecified: Secondary | ICD-10-CM | POA: Diagnosis not present

## 2016-05-21 DIAGNOSIS — N186 End stage renal disease: Secondary | ICD-10-CM | POA: Diagnosis not present

## 2016-05-21 DIAGNOSIS — E213 Hyperparathyroidism, unspecified: Secondary | ICD-10-CM | POA: Diagnosis not present

## 2016-05-21 DIAGNOSIS — D509 Iron deficiency anemia, unspecified: Secondary | ICD-10-CM | POA: Diagnosis not present

## 2016-05-23 DIAGNOSIS — N186 End stage renal disease: Secondary | ICD-10-CM | POA: Diagnosis not present

## 2016-05-23 DIAGNOSIS — D509 Iron deficiency anemia, unspecified: Secondary | ICD-10-CM | POA: Diagnosis not present

## 2016-05-23 DIAGNOSIS — E213 Hyperparathyroidism, unspecified: Secondary | ICD-10-CM | POA: Diagnosis not present

## 2016-05-25 DIAGNOSIS — D509 Iron deficiency anemia, unspecified: Secondary | ICD-10-CM | POA: Diagnosis not present

## 2016-05-25 DIAGNOSIS — E213 Hyperparathyroidism, unspecified: Secondary | ICD-10-CM | POA: Diagnosis not present

## 2016-05-25 DIAGNOSIS — N186 End stage renal disease: Secondary | ICD-10-CM | POA: Diagnosis not present

## 2016-05-27 DIAGNOSIS — N186 End stage renal disease: Secondary | ICD-10-CM | POA: Diagnosis not present

## 2016-05-27 DIAGNOSIS — E213 Hyperparathyroidism, unspecified: Secondary | ICD-10-CM | POA: Diagnosis not present

## 2016-05-27 DIAGNOSIS — D509 Iron deficiency anemia, unspecified: Secondary | ICD-10-CM | POA: Diagnosis not present

## 2016-05-29 DIAGNOSIS — D509 Iron deficiency anemia, unspecified: Secondary | ICD-10-CM | POA: Diagnosis not present

## 2016-05-29 DIAGNOSIS — N186 End stage renal disease: Secondary | ICD-10-CM | POA: Diagnosis not present

## 2016-05-29 DIAGNOSIS — E213 Hyperparathyroidism, unspecified: Secondary | ICD-10-CM | POA: Diagnosis not present

## 2016-06-01 DIAGNOSIS — D509 Iron deficiency anemia, unspecified: Secondary | ICD-10-CM | POA: Diagnosis not present

## 2016-06-01 DIAGNOSIS — N186 End stage renal disease: Secondary | ICD-10-CM | POA: Diagnosis not present

## 2016-06-01 DIAGNOSIS — E213 Hyperparathyroidism, unspecified: Secondary | ICD-10-CM | POA: Diagnosis not present

## 2016-06-04 DIAGNOSIS — N186 End stage renal disease: Secondary | ICD-10-CM | POA: Diagnosis not present

## 2016-06-04 DIAGNOSIS — D509 Iron deficiency anemia, unspecified: Secondary | ICD-10-CM | POA: Diagnosis not present

## 2016-06-04 DIAGNOSIS — E213 Hyperparathyroidism, unspecified: Secondary | ICD-10-CM | POA: Diagnosis not present

## 2016-06-06 DIAGNOSIS — Z992 Dependence on renal dialysis: Secondary | ICD-10-CM | POA: Diagnosis not present

## 2016-06-06 DIAGNOSIS — I120 Hypertensive chronic kidney disease with stage 5 chronic kidney disease or end stage renal disease: Secondary | ICD-10-CM | POA: Diagnosis not present

## 2016-06-06 DIAGNOSIS — N186 End stage renal disease: Secondary | ICD-10-CM | POA: Diagnosis not present

## 2016-06-06 DIAGNOSIS — D509 Iron deficiency anemia, unspecified: Secondary | ICD-10-CM | POA: Diagnosis not present

## 2016-06-06 DIAGNOSIS — E213 Hyperparathyroidism, unspecified: Secondary | ICD-10-CM | POA: Diagnosis not present

## 2016-06-08 DIAGNOSIS — E213 Hyperparathyroidism, unspecified: Secondary | ICD-10-CM | POA: Diagnosis not present

## 2016-06-08 DIAGNOSIS — N186 End stage renal disease: Secondary | ICD-10-CM | POA: Diagnosis not present

## 2016-06-08 DIAGNOSIS — D509 Iron deficiency anemia, unspecified: Secondary | ICD-10-CM | POA: Diagnosis not present

## 2016-06-08 DIAGNOSIS — D631 Anemia in chronic kidney disease: Secondary | ICD-10-CM | POA: Diagnosis not present

## 2016-06-11 DIAGNOSIS — D509 Iron deficiency anemia, unspecified: Secondary | ICD-10-CM | POA: Diagnosis not present

## 2016-06-11 DIAGNOSIS — N186 End stage renal disease: Secondary | ICD-10-CM | POA: Diagnosis not present

## 2016-06-11 DIAGNOSIS — D631 Anemia in chronic kidney disease: Secondary | ICD-10-CM | POA: Diagnosis not present

## 2016-06-11 DIAGNOSIS — E213 Hyperparathyroidism, unspecified: Secondary | ICD-10-CM | POA: Diagnosis not present

## 2016-06-13 DIAGNOSIS — D509 Iron deficiency anemia, unspecified: Secondary | ICD-10-CM | POA: Diagnosis not present

## 2016-06-13 DIAGNOSIS — N186 End stage renal disease: Secondary | ICD-10-CM | POA: Diagnosis not present

## 2016-06-13 DIAGNOSIS — D631 Anemia in chronic kidney disease: Secondary | ICD-10-CM | POA: Diagnosis not present

## 2016-06-13 DIAGNOSIS — E213 Hyperparathyroidism, unspecified: Secondary | ICD-10-CM | POA: Diagnosis not present

## 2016-06-15 DIAGNOSIS — D631 Anemia in chronic kidney disease: Secondary | ICD-10-CM | POA: Diagnosis not present

## 2016-06-15 DIAGNOSIS — E213 Hyperparathyroidism, unspecified: Secondary | ICD-10-CM | POA: Diagnosis not present

## 2016-06-15 DIAGNOSIS — D509 Iron deficiency anemia, unspecified: Secondary | ICD-10-CM | POA: Diagnosis not present

## 2016-06-15 DIAGNOSIS — N186 End stage renal disease: Secondary | ICD-10-CM | POA: Diagnosis not present

## 2016-06-18 DIAGNOSIS — E213 Hyperparathyroidism, unspecified: Secondary | ICD-10-CM | POA: Diagnosis not present

## 2016-06-18 DIAGNOSIS — D509 Iron deficiency anemia, unspecified: Secondary | ICD-10-CM | POA: Diagnosis not present

## 2016-06-18 DIAGNOSIS — D631 Anemia in chronic kidney disease: Secondary | ICD-10-CM | POA: Diagnosis not present

## 2016-06-18 DIAGNOSIS — N186 End stage renal disease: Secondary | ICD-10-CM | POA: Diagnosis not present

## 2016-06-20 DIAGNOSIS — D631 Anemia in chronic kidney disease: Secondary | ICD-10-CM | POA: Diagnosis not present

## 2016-06-20 DIAGNOSIS — E213 Hyperparathyroidism, unspecified: Secondary | ICD-10-CM | POA: Diagnosis not present

## 2016-06-20 DIAGNOSIS — N186 End stage renal disease: Secondary | ICD-10-CM | POA: Diagnosis not present

## 2016-06-20 DIAGNOSIS — D509 Iron deficiency anemia, unspecified: Secondary | ICD-10-CM | POA: Diagnosis not present

## 2016-06-22 DIAGNOSIS — D631 Anemia in chronic kidney disease: Secondary | ICD-10-CM | POA: Diagnosis not present

## 2016-06-22 DIAGNOSIS — D509 Iron deficiency anemia, unspecified: Secondary | ICD-10-CM | POA: Diagnosis not present

## 2016-06-22 DIAGNOSIS — E213 Hyperparathyroidism, unspecified: Secondary | ICD-10-CM | POA: Diagnosis not present

## 2016-06-22 DIAGNOSIS — N186 End stage renal disease: Secondary | ICD-10-CM | POA: Diagnosis not present

## 2016-06-24 DIAGNOSIS — Z452 Encounter for adjustment and management of vascular access device: Secondary | ICD-10-CM | POA: Diagnosis not present

## 2016-06-25 DIAGNOSIS — E213 Hyperparathyroidism, unspecified: Secondary | ICD-10-CM | POA: Diagnosis not present

## 2016-06-25 DIAGNOSIS — D631 Anemia in chronic kidney disease: Secondary | ICD-10-CM | POA: Diagnosis not present

## 2016-06-25 DIAGNOSIS — D509 Iron deficiency anemia, unspecified: Secondary | ICD-10-CM | POA: Diagnosis not present

## 2016-06-25 DIAGNOSIS — N186 End stage renal disease: Secondary | ICD-10-CM | POA: Diagnosis not present

## 2016-06-27 DIAGNOSIS — E213 Hyperparathyroidism, unspecified: Secondary | ICD-10-CM | POA: Diagnosis not present

## 2016-06-27 DIAGNOSIS — N186 End stage renal disease: Secondary | ICD-10-CM | POA: Diagnosis not present

## 2016-06-27 DIAGNOSIS — D631 Anemia in chronic kidney disease: Secondary | ICD-10-CM | POA: Diagnosis not present

## 2016-06-27 DIAGNOSIS — D509 Iron deficiency anemia, unspecified: Secondary | ICD-10-CM | POA: Diagnosis not present

## 2016-06-29 DIAGNOSIS — D509 Iron deficiency anemia, unspecified: Secondary | ICD-10-CM | POA: Diagnosis not present

## 2016-06-29 DIAGNOSIS — D631 Anemia in chronic kidney disease: Secondary | ICD-10-CM | POA: Diagnosis not present

## 2016-06-29 DIAGNOSIS — E213 Hyperparathyroidism, unspecified: Secondary | ICD-10-CM | POA: Diagnosis not present

## 2016-06-29 DIAGNOSIS — N186 End stage renal disease: Secondary | ICD-10-CM | POA: Diagnosis not present

## 2016-07-02 DIAGNOSIS — N186 End stage renal disease: Secondary | ICD-10-CM | POA: Diagnosis not present

## 2016-07-02 DIAGNOSIS — E213 Hyperparathyroidism, unspecified: Secondary | ICD-10-CM | POA: Diagnosis not present

## 2016-07-02 DIAGNOSIS — D631 Anemia in chronic kidney disease: Secondary | ICD-10-CM | POA: Diagnosis not present

## 2016-07-02 DIAGNOSIS — D509 Iron deficiency anemia, unspecified: Secondary | ICD-10-CM | POA: Diagnosis not present

## 2016-07-04 DIAGNOSIS — D509 Iron deficiency anemia, unspecified: Secondary | ICD-10-CM | POA: Diagnosis not present

## 2016-07-04 DIAGNOSIS — E213 Hyperparathyroidism, unspecified: Secondary | ICD-10-CM | POA: Diagnosis not present

## 2016-07-04 DIAGNOSIS — D631 Anemia in chronic kidney disease: Secondary | ICD-10-CM | POA: Diagnosis not present

## 2016-07-04 DIAGNOSIS — N186 End stage renal disease: Secondary | ICD-10-CM | POA: Diagnosis not present

## 2016-07-06 DIAGNOSIS — D509 Iron deficiency anemia, unspecified: Secondary | ICD-10-CM | POA: Diagnosis not present

## 2016-07-06 DIAGNOSIS — E213 Hyperparathyroidism, unspecified: Secondary | ICD-10-CM | POA: Diagnosis not present

## 2016-07-06 DIAGNOSIS — N186 End stage renal disease: Secondary | ICD-10-CM | POA: Diagnosis not present

## 2016-07-06 DIAGNOSIS — D631 Anemia in chronic kidney disease: Secondary | ICD-10-CM | POA: Diagnosis not present

## 2016-07-07 DIAGNOSIS — Z992 Dependence on renal dialysis: Secondary | ICD-10-CM | POA: Diagnosis not present

## 2016-07-07 DIAGNOSIS — I120 Hypertensive chronic kidney disease with stage 5 chronic kidney disease or end stage renal disease: Secondary | ICD-10-CM | POA: Diagnosis not present

## 2016-07-07 DIAGNOSIS — N186 End stage renal disease: Secondary | ICD-10-CM | POA: Diagnosis not present

## 2016-07-09 DIAGNOSIS — N186 End stage renal disease: Secondary | ICD-10-CM | POA: Diagnosis not present

## 2016-07-09 DIAGNOSIS — D509 Iron deficiency anemia, unspecified: Secondary | ICD-10-CM | POA: Diagnosis not present

## 2016-07-09 DIAGNOSIS — Z992 Dependence on renal dialysis: Secondary | ICD-10-CM | POA: Diagnosis not present

## 2016-07-09 DIAGNOSIS — N2581 Secondary hyperparathyroidism of renal origin: Secondary | ICD-10-CM | POA: Diagnosis not present

## 2016-07-09 DIAGNOSIS — E213 Hyperparathyroidism, unspecified: Secondary | ICD-10-CM | POA: Diagnosis not present

## 2016-07-11 DIAGNOSIS — D509 Iron deficiency anemia, unspecified: Secondary | ICD-10-CM | POA: Diagnosis not present

## 2016-07-11 DIAGNOSIS — N186 End stage renal disease: Secondary | ICD-10-CM | POA: Diagnosis not present

## 2016-07-11 DIAGNOSIS — E213 Hyperparathyroidism, unspecified: Secondary | ICD-10-CM | POA: Diagnosis not present

## 2016-07-11 DIAGNOSIS — Z992 Dependence on renal dialysis: Secondary | ICD-10-CM | POA: Diagnosis not present

## 2016-07-11 DIAGNOSIS — N2581 Secondary hyperparathyroidism of renal origin: Secondary | ICD-10-CM | POA: Diagnosis not present

## 2016-07-13 DIAGNOSIS — N186 End stage renal disease: Secondary | ICD-10-CM | POA: Diagnosis not present

## 2016-07-13 DIAGNOSIS — Z992 Dependence on renal dialysis: Secondary | ICD-10-CM | POA: Diagnosis not present

## 2016-07-13 DIAGNOSIS — E213 Hyperparathyroidism, unspecified: Secondary | ICD-10-CM | POA: Diagnosis not present

## 2016-07-13 DIAGNOSIS — D509 Iron deficiency anemia, unspecified: Secondary | ICD-10-CM | POA: Diagnosis not present

## 2016-07-13 DIAGNOSIS — N2581 Secondary hyperparathyroidism of renal origin: Secondary | ICD-10-CM | POA: Diagnosis not present

## 2016-07-16 DIAGNOSIS — E213 Hyperparathyroidism, unspecified: Secondary | ICD-10-CM | POA: Diagnosis not present

## 2016-07-16 DIAGNOSIS — N186 End stage renal disease: Secondary | ICD-10-CM | POA: Diagnosis not present

## 2016-07-16 DIAGNOSIS — Z992 Dependence on renal dialysis: Secondary | ICD-10-CM | POA: Diagnosis not present

## 2016-07-16 DIAGNOSIS — D509 Iron deficiency anemia, unspecified: Secondary | ICD-10-CM | POA: Diagnosis not present

## 2016-07-16 DIAGNOSIS — N2581 Secondary hyperparathyroidism of renal origin: Secondary | ICD-10-CM | POA: Diagnosis not present

## 2016-07-18 DIAGNOSIS — Z992 Dependence on renal dialysis: Secondary | ICD-10-CM | POA: Diagnosis not present

## 2016-07-18 DIAGNOSIS — D509 Iron deficiency anemia, unspecified: Secondary | ICD-10-CM | POA: Diagnosis not present

## 2016-07-18 DIAGNOSIS — E213 Hyperparathyroidism, unspecified: Secondary | ICD-10-CM | POA: Diagnosis not present

## 2016-07-18 DIAGNOSIS — N186 End stage renal disease: Secondary | ICD-10-CM | POA: Diagnosis not present

## 2016-07-18 DIAGNOSIS — N2581 Secondary hyperparathyroidism of renal origin: Secondary | ICD-10-CM | POA: Diagnosis not present

## 2016-07-20 DIAGNOSIS — E213 Hyperparathyroidism, unspecified: Secondary | ICD-10-CM | POA: Diagnosis not present

## 2016-07-20 DIAGNOSIS — Z992 Dependence on renal dialysis: Secondary | ICD-10-CM | POA: Diagnosis not present

## 2016-07-20 DIAGNOSIS — N2581 Secondary hyperparathyroidism of renal origin: Secondary | ICD-10-CM | POA: Diagnosis not present

## 2016-07-20 DIAGNOSIS — D509 Iron deficiency anemia, unspecified: Secondary | ICD-10-CM | POA: Diagnosis not present

## 2016-07-20 DIAGNOSIS — N186 End stage renal disease: Secondary | ICD-10-CM | POA: Diagnosis not present

## 2016-07-23 DIAGNOSIS — N2581 Secondary hyperparathyroidism of renal origin: Secondary | ICD-10-CM | POA: Diagnosis not present

## 2016-07-23 DIAGNOSIS — E213 Hyperparathyroidism, unspecified: Secondary | ICD-10-CM | POA: Diagnosis not present

## 2016-07-23 DIAGNOSIS — N186 End stage renal disease: Secondary | ICD-10-CM | POA: Diagnosis not present

## 2016-07-23 DIAGNOSIS — Z992 Dependence on renal dialysis: Secondary | ICD-10-CM | POA: Diagnosis not present

## 2016-07-23 DIAGNOSIS — D509 Iron deficiency anemia, unspecified: Secondary | ICD-10-CM | POA: Diagnosis not present

## 2016-07-25 DIAGNOSIS — D509 Iron deficiency anemia, unspecified: Secondary | ICD-10-CM | POA: Diagnosis not present

## 2016-07-25 DIAGNOSIS — N186 End stage renal disease: Secondary | ICD-10-CM | POA: Diagnosis not present

## 2016-07-25 DIAGNOSIS — E213 Hyperparathyroidism, unspecified: Secondary | ICD-10-CM | POA: Diagnosis not present

## 2016-07-25 DIAGNOSIS — Z992 Dependence on renal dialysis: Secondary | ICD-10-CM | POA: Diagnosis not present

## 2016-07-25 DIAGNOSIS — N2581 Secondary hyperparathyroidism of renal origin: Secondary | ICD-10-CM | POA: Diagnosis not present

## 2016-07-27 DIAGNOSIS — D509 Iron deficiency anemia, unspecified: Secondary | ICD-10-CM | POA: Diagnosis not present

## 2016-07-27 DIAGNOSIS — E213 Hyperparathyroidism, unspecified: Secondary | ICD-10-CM | POA: Diagnosis not present

## 2016-07-27 DIAGNOSIS — N2581 Secondary hyperparathyroidism of renal origin: Secondary | ICD-10-CM | POA: Diagnosis not present

## 2016-07-27 DIAGNOSIS — N186 End stage renal disease: Secondary | ICD-10-CM | POA: Diagnosis not present

## 2016-07-27 DIAGNOSIS — Z992 Dependence on renal dialysis: Secondary | ICD-10-CM | POA: Diagnosis not present

## 2016-07-30 DIAGNOSIS — Z992 Dependence on renal dialysis: Secondary | ICD-10-CM | POA: Diagnosis not present

## 2016-07-30 DIAGNOSIS — N2581 Secondary hyperparathyroidism of renal origin: Secondary | ICD-10-CM | POA: Diagnosis not present

## 2016-07-30 DIAGNOSIS — N186 End stage renal disease: Secondary | ICD-10-CM | POA: Diagnosis not present

## 2016-07-30 DIAGNOSIS — E213 Hyperparathyroidism, unspecified: Secondary | ICD-10-CM | POA: Diagnosis not present

## 2016-07-30 DIAGNOSIS — D509 Iron deficiency anemia, unspecified: Secondary | ICD-10-CM | POA: Diagnosis not present

## 2016-08-01 DIAGNOSIS — E213 Hyperparathyroidism, unspecified: Secondary | ICD-10-CM | POA: Diagnosis not present

## 2016-08-01 DIAGNOSIS — N186 End stage renal disease: Secondary | ICD-10-CM | POA: Diagnosis not present

## 2016-08-01 DIAGNOSIS — E1129 Type 2 diabetes mellitus with other diabetic kidney complication: Secondary | ICD-10-CM | POA: Diagnosis not present

## 2016-08-01 DIAGNOSIS — N2581 Secondary hyperparathyroidism of renal origin: Secondary | ICD-10-CM | POA: Diagnosis not present

## 2016-08-01 DIAGNOSIS — Z992 Dependence on renal dialysis: Secondary | ICD-10-CM | POA: Diagnosis not present

## 2016-08-01 DIAGNOSIS — D509 Iron deficiency anemia, unspecified: Secondary | ICD-10-CM | POA: Diagnosis not present

## 2016-08-03 DIAGNOSIS — E213 Hyperparathyroidism, unspecified: Secondary | ICD-10-CM | POA: Diagnosis not present

## 2016-08-03 DIAGNOSIS — D509 Iron deficiency anemia, unspecified: Secondary | ICD-10-CM | POA: Diagnosis not present

## 2016-08-03 DIAGNOSIS — N186 End stage renal disease: Secondary | ICD-10-CM | POA: Diagnosis not present

## 2016-08-03 DIAGNOSIS — N2581 Secondary hyperparathyroidism of renal origin: Secondary | ICD-10-CM | POA: Diagnosis not present

## 2016-08-03 DIAGNOSIS — Z992 Dependence on renal dialysis: Secondary | ICD-10-CM | POA: Diagnosis not present

## 2016-08-06 DIAGNOSIS — E213 Hyperparathyroidism, unspecified: Secondary | ICD-10-CM | POA: Diagnosis not present

## 2016-08-06 DIAGNOSIS — Z992 Dependence on renal dialysis: Secondary | ICD-10-CM | POA: Diagnosis not present

## 2016-08-06 DIAGNOSIS — N2581 Secondary hyperparathyroidism of renal origin: Secondary | ICD-10-CM | POA: Diagnosis not present

## 2016-08-06 DIAGNOSIS — N186 End stage renal disease: Secondary | ICD-10-CM | POA: Diagnosis not present

## 2016-08-06 DIAGNOSIS — D509 Iron deficiency anemia, unspecified: Secondary | ICD-10-CM | POA: Diagnosis not present

## 2016-08-07 DIAGNOSIS — I120 Hypertensive chronic kidney disease with stage 5 chronic kidney disease or end stage renal disease: Secondary | ICD-10-CM | POA: Diagnosis not present

## 2016-08-07 DIAGNOSIS — N186 End stage renal disease: Secondary | ICD-10-CM | POA: Diagnosis not present

## 2016-08-07 DIAGNOSIS — Z992 Dependence on renal dialysis: Secondary | ICD-10-CM | POA: Diagnosis not present

## 2016-08-08 DIAGNOSIS — D509 Iron deficiency anemia, unspecified: Secondary | ICD-10-CM | POA: Diagnosis not present

## 2016-08-08 DIAGNOSIS — N186 End stage renal disease: Secondary | ICD-10-CM | POA: Diagnosis not present

## 2016-08-08 DIAGNOSIS — E213 Hyperparathyroidism, unspecified: Secondary | ICD-10-CM | POA: Diagnosis not present

## 2016-08-08 DIAGNOSIS — N2581 Secondary hyperparathyroidism of renal origin: Secondary | ICD-10-CM | POA: Diagnosis not present

## 2016-08-10 DIAGNOSIS — N186 End stage renal disease: Secondary | ICD-10-CM | POA: Diagnosis not present

## 2016-08-10 DIAGNOSIS — N2581 Secondary hyperparathyroidism of renal origin: Secondary | ICD-10-CM | POA: Diagnosis not present

## 2016-08-10 DIAGNOSIS — E213 Hyperparathyroidism, unspecified: Secondary | ICD-10-CM | POA: Diagnosis not present

## 2016-08-10 DIAGNOSIS — D509 Iron deficiency anemia, unspecified: Secondary | ICD-10-CM | POA: Diagnosis not present

## 2016-08-13 DIAGNOSIS — N186 End stage renal disease: Secondary | ICD-10-CM | POA: Diagnosis not present

## 2016-08-13 DIAGNOSIS — E213 Hyperparathyroidism, unspecified: Secondary | ICD-10-CM | POA: Diagnosis not present

## 2016-08-13 DIAGNOSIS — D509 Iron deficiency anemia, unspecified: Secondary | ICD-10-CM | POA: Diagnosis not present

## 2016-08-13 DIAGNOSIS — N2581 Secondary hyperparathyroidism of renal origin: Secondary | ICD-10-CM | POA: Diagnosis not present

## 2016-08-15 DIAGNOSIS — E213 Hyperparathyroidism, unspecified: Secondary | ICD-10-CM | POA: Diagnosis not present

## 2016-08-15 DIAGNOSIS — N2581 Secondary hyperparathyroidism of renal origin: Secondary | ICD-10-CM | POA: Diagnosis not present

## 2016-08-15 DIAGNOSIS — N186 End stage renal disease: Secondary | ICD-10-CM | POA: Diagnosis not present

## 2016-08-15 DIAGNOSIS — D509 Iron deficiency anemia, unspecified: Secondary | ICD-10-CM | POA: Diagnosis not present

## 2016-08-17 DIAGNOSIS — N2581 Secondary hyperparathyroidism of renal origin: Secondary | ICD-10-CM | POA: Diagnosis not present

## 2016-08-17 DIAGNOSIS — D509 Iron deficiency anemia, unspecified: Secondary | ICD-10-CM | POA: Diagnosis not present

## 2016-08-17 DIAGNOSIS — E213 Hyperparathyroidism, unspecified: Secondary | ICD-10-CM | POA: Diagnosis not present

## 2016-08-17 DIAGNOSIS — N186 End stage renal disease: Secondary | ICD-10-CM | POA: Diagnosis not present

## 2016-08-20 DIAGNOSIS — E213 Hyperparathyroidism, unspecified: Secondary | ICD-10-CM | POA: Diagnosis not present

## 2016-08-20 DIAGNOSIS — N2581 Secondary hyperparathyroidism of renal origin: Secondary | ICD-10-CM | POA: Diagnosis not present

## 2016-08-20 DIAGNOSIS — N186 End stage renal disease: Secondary | ICD-10-CM | POA: Diagnosis not present

## 2016-08-20 DIAGNOSIS — D509 Iron deficiency anemia, unspecified: Secondary | ICD-10-CM | POA: Diagnosis not present

## 2016-08-22 DIAGNOSIS — N2581 Secondary hyperparathyroidism of renal origin: Secondary | ICD-10-CM | POA: Diagnosis not present

## 2016-08-22 DIAGNOSIS — D509 Iron deficiency anemia, unspecified: Secondary | ICD-10-CM | POA: Diagnosis not present

## 2016-08-22 DIAGNOSIS — N186 End stage renal disease: Secondary | ICD-10-CM | POA: Diagnosis not present

## 2016-08-22 DIAGNOSIS — E213 Hyperparathyroidism, unspecified: Secondary | ICD-10-CM | POA: Diagnosis not present

## 2016-08-24 DIAGNOSIS — N186 End stage renal disease: Secondary | ICD-10-CM | POA: Diagnosis not present

## 2016-08-24 DIAGNOSIS — D509 Iron deficiency anemia, unspecified: Secondary | ICD-10-CM | POA: Diagnosis not present

## 2016-08-24 DIAGNOSIS — E213 Hyperparathyroidism, unspecified: Secondary | ICD-10-CM | POA: Diagnosis not present

## 2016-08-24 DIAGNOSIS — N2581 Secondary hyperparathyroidism of renal origin: Secondary | ICD-10-CM | POA: Diagnosis not present

## 2016-08-27 DIAGNOSIS — N186 End stage renal disease: Secondary | ICD-10-CM | POA: Diagnosis not present

## 2016-08-27 DIAGNOSIS — N2581 Secondary hyperparathyroidism of renal origin: Secondary | ICD-10-CM | POA: Diagnosis not present

## 2016-08-27 DIAGNOSIS — E213 Hyperparathyroidism, unspecified: Secondary | ICD-10-CM | POA: Diagnosis not present

## 2016-08-27 DIAGNOSIS — D509 Iron deficiency anemia, unspecified: Secondary | ICD-10-CM | POA: Diagnosis not present

## 2016-08-29 DIAGNOSIS — N2581 Secondary hyperparathyroidism of renal origin: Secondary | ICD-10-CM | POA: Diagnosis not present

## 2016-08-29 DIAGNOSIS — N186 End stage renal disease: Secondary | ICD-10-CM | POA: Diagnosis not present

## 2016-08-29 DIAGNOSIS — D509 Iron deficiency anemia, unspecified: Secondary | ICD-10-CM | POA: Diagnosis not present

## 2016-08-29 DIAGNOSIS — E213 Hyperparathyroidism, unspecified: Secondary | ICD-10-CM | POA: Diagnosis not present

## 2016-08-31 DIAGNOSIS — N186 End stage renal disease: Secondary | ICD-10-CM | POA: Diagnosis not present

## 2016-08-31 DIAGNOSIS — N2581 Secondary hyperparathyroidism of renal origin: Secondary | ICD-10-CM | POA: Diagnosis not present

## 2016-08-31 DIAGNOSIS — E213 Hyperparathyroidism, unspecified: Secondary | ICD-10-CM | POA: Diagnosis not present

## 2016-08-31 DIAGNOSIS — D509 Iron deficiency anemia, unspecified: Secondary | ICD-10-CM | POA: Diagnosis not present

## 2016-09-03 DIAGNOSIS — N186 End stage renal disease: Secondary | ICD-10-CM | POA: Diagnosis not present

## 2016-09-03 DIAGNOSIS — E213 Hyperparathyroidism, unspecified: Secondary | ICD-10-CM | POA: Diagnosis not present

## 2016-09-03 DIAGNOSIS — N2581 Secondary hyperparathyroidism of renal origin: Secondary | ICD-10-CM | POA: Diagnosis not present

## 2016-09-03 DIAGNOSIS — D509 Iron deficiency anemia, unspecified: Secondary | ICD-10-CM | POA: Diagnosis not present

## 2016-09-04 DIAGNOSIS — N186 End stage renal disease: Secondary | ICD-10-CM | POA: Diagnosis not present

## 2016-09-04 DIAGNOSIS — Z992 Dependence on renal dialysis: Secondary | ICD-10-CM | POA: Diagnosis not present

## 2016-09-04 DIAGNOSIS — I120 Hypertensive chronic kidney disease with stage 5 chronic kidney disease or end stage renal disease: Secondary | ICD-10-CM | POA: Diagnosis not present

## 2016-09-05 DIAGNOSIS — N186 End stage renal disease: Secondary | ICD-10-CM | POA: Diagnosis not present

## 2016-09-05 DIAGNOSIS — D509 Iron deficiency anemia, unspecified: Secondary | ICD-10-CM | POA: Diagnosis not present

## 2016-09-05 DIAGNOSIS — N2581 Secondary hyperparathyroidism of renal origin: Secondary | ICD-10-CM | POA: Diagnosis not present

## 2016-09-05 DIAGNOSIS — E213 Hyperparathyroidism, unspecified: Secondary | ICD-10-CM | POA: Diagnosis not present

## 2016-09-07 DIAGNOSIS — N2581 Secondary hyperparathyroidism of renal origin: Secondary | ICD-10-CM | POA: Diagnosis not present

## 2016-09-07 DIAGNOSIS — D509 Iron deficiency anemia, unspecified: Secondary | ICD-10-CM | POA: Diagnosis not present

## 2016-09-07 DIAGNOSIS — N186 End stage renal disease: Secondary | ICD-10-CM | POA: Diagnosis not present

## 2016-09-07 DIAGNOSIS — E213 Hyperparathyroidism, unspecified: Secondary | ICD-10-CM | POA: Diagnosis not present

## 2016-09-10 DIAGNOSIS — N186 End stage renal disease: Secondary | ICD-10-CM | POA: Diagnosis not present

## 2016-09-10 DIAGNOSIS — D509 Iron deficiency anemia, unspecified: Secondary | ICD-10-CM | POA: Diagnosis not present

## 2016-09-10 DIAGNOSIS — E213 Hyperparathyroidism, unspecified: Secondary | ICD-10-CM | POA: Diagnosis not present

## 2016-09-10 DIAGNOSIS — N2581 Secondary hyperparathyroidism of renal origin: Secondary | ICD-10-CM | POA: Diagnosis not present

## 2016-09-12 DIAGNOSIS — N186 End stage renal disease: Secondary | ICD-10-CM | POA: Diagnosis not present

## 2016-09-12 DIAGNOSIS — E213 Hyperparathyroidism, unspecified: Secondary | ICD-10-CM | POA: Diagnosis not present

## 2016-09-12 DIAGNOSIS — D509 Iron deficiency anemia, unspecified: Secondary | ICD-10-CM | POA: Diagnosis not present

## 2016-09-12 DIAGNOSIS — N2581 Secondary hyperparathyroidism of renal origin: Secondary | ICD-10-CM | POA: Diagnosis not present

## 2016-09-14 DIAGNOSIS — N186 End stage renal disease: Secondary | ICD-10-CM | POA: Diagnosis not present

## 2016-09-14 DIAGNOSIS — N2581 Secondary hyperparathyroidism of renal origin: Secondary | ICD-10-CM | POA: Diagnosis not present

## 2016-09-14 DIAGNOSIS — D509 Iron deficiency anemia, unspecified: Secondary | ICD-10-CM | POA: Diagnosis not present

## 2016-09-14 DIAGNOSIS — E213 Hyperparathyroidism, unspecified: Secondary | ICD-10-CM | POA: Diagnosis not present

## 2016-09-16 DIAGNOSIS — N186 End stage renal disease: Secondary | ICD-10-CM | POA: Diagnosis not present

## 2016-09-16 DIAGNOSIS — Z992 Dependence on renal dialysis: Secondary | ICD-10-CM | POA: Diagnosis not present

## 2016-09-16 DIAGNOSIS — I871 Compression of vein: Secondary | ICD-10-CM | POA: Diagnosis not present

## 2016-09-16 DIAGNOSIS — T82858A Stenosis of vascular prosthetic devices, implants and grafts, initial encounter: Secondary | ICD-10-CM | POA: Diagnosis not present

## 2016-09-17 DIAGNOSIS — N2581 Secondary hyperparathyroidism of renal origin: Secondary | ICD-10-CM | POA: Diagnosis not present

## 2016-09-17 DIAGNOSIS — N186 End stage renal disease: Secondary | ICD-10-CM | POA: Diagnosis not present

## 2016-09-17 DIAGNOSIS — D509 Iron deficiency anemia, unspecified: Secondary | ICD-10-CM | POA: Diagnosis not present

## 2016-09-17 DIAGNOSIS — E213 Hyperparathyroidism, unspecified: Secondary | ICD-10-CM | POA: Diagnosis not present

## 2016-09-19 DIAGNOSIS — N186 End stage renal disease: Secondary | ICD-10-CM | POA: Diagnosis not present

## 2016-09-19 DIAGNOSIS — D509 Iron deficiency anemia, unspecified: Secondary | ICD-10-CM | POA: Diagnosis not present

## 2016-09-19 DIAGNOSIS — E213 Hyperparathyroidism, unspecified: Secondary | ICD-10-CM | POA: Diagnosis not present

## 2016-09-19 DIAGNOSIS — N2581 Secondary hyperparathyroidism of renal origin: Secondary | ICD-10-CM | POA: Diagnosis not present

## 2016-09-21 DIAGNOSIS — N2581 Secondary hyperparathyroidism of renal origin: Secondary | ICD-10-CM | POA: Diagnosis not present

## 2016-09-21 DIAGNOSIS — N186 End stage renal disease: Secondary | ICD-10-CM | POA: Diagnosis not present

## 2016-09-21 DIAGNOSIS — E213 Hyperparathyroidism, unspecified: Secondary | ICD-10-CM | POA: Diagnosis not present

## 2016-09-21 DIAGNOSIS — D509 Iron deficiency anemia, unspecified: Secondary | ICD-10-CM | POA: Diagnosis not present

## 2016-09-24 DIAGNOSIS — N186 End stage renal disease: Secondary | ICD-10-CM | POA: Diagnosis not present

## 2016-09-24 DIAGNOSIS — E213 Hyperparathyroidism, unspecified: Secondary | ICD-10-CM | POA: Diagnosis not present

## 2016-09-24 DIAGNOSIS — D509 Iron deficiency anemia, unspecified: Secondary | ICD-10-CM | POA: Diagnosis not present

## 2016-09-24 DIAGNOSIS — N2581 Secondary hyperparathyroidism of renal origin: Secondary | ICD-10-CM | POA: Diagnosis not present

## 2016-09-26 DIAGNOSIS — N186 End stage renal disease: Secondary | ICD-10-CM | POA: Diagnosis not present

## 2016-09-26 DIAGNOSIS — E213 Hyperparathyroidism, unspecified: Secondary | ICD-10-CM | POA: Diagnosis not present

## 2016-09-26 DIAGNOSIS — N2581 Secondary hyperparathyroidism of renal origin: Secondary | ICD-10-CM | POA: Diagnosis not present

## 2016-09-26 DIAGNOSIS — D509 Iron deficiency anemia, unspecified: Secondary | ICD-10-CM | POA: Diagnosis not present

## 2016-09-28 DIAGNOSIS — E213 Hyperparathyroidism, unspecified: Secondary | ICD-10-CM | POA: Diagnosis not present

## 2016-09-28 DIAGNOSIS — D509 Iron deficiency anemia, unspecified: Secondary | ICD-10-CM | POA: Diagnosis not present

## 2016-09-28 DIAGNOSIS — N186 End stage renal disease: Secondary | ICD-10-CM | POA: Diagnosis not present

## 2016-09-28 DIAGNOSIS — N2581 Secondary hyperparathyroidism of renal origin: Secondary | ICD-10-CM | POA: Diagnosis not present

## 2016-10-01 DIAGNOSIS — E213 Hyperparathyroidism, unspecified: Secondary | ICD-10-CM | POA: Diagnosis not present

## 2016-10-01 DIAGNOSIS — N2581 Secondary hyperparathyroidism of renal origin: Secondary | ICD-10-CM | POA: Diagnosis not present

## 2016-10-01 DIAGNOSIS — N186 End stage renal disease: Secondary | ICD-10-CM | POA: Diagnosis not present

## 2016-10-01 DIAGNOSIS — D509 Iron deficiency anemia, unspecified: Secondary | ICD-10-CM | POA: Diagnosis not present

## 2016-10-03 DIAGNOSIS — D509 Iron deficiency anemia, unspecified: Secondary | ICD-10-CM | POA: Diagnosis not present

## 2016-10-03 DIAGNOSIS — N2581 Secondary hyperparathyroidism of renal origin: Secondary | ICD-10-CM | POA: Diagnosis not present

## 2016-10-03 DIAGNOSIS — N186 End stage renal disease: Secondary | ICD-10-CM | POA: Diagnosis not present

## 2016-10-03 DIAGNOSIS — E213 Hyperparathyroidism, unspecified: Secondary | ICD-10-CM | POA: Diagnosis not present

## 2016-10-05 DIAGNOSIS — D509 Iron deficiency anemia, unspecified: Secondary | ICD-10-CM | POA: Diagnosis not present

## 2016-10-05 DIAGNOSIS — I120 Hypertensive chronic kidney disease with stage 5 chronic kidney disease or end stage renal disease: Secondary | ICD-10-CM | POA: Diagnosis not present

## 2016-10-05 DIAGNOSIS — Z992 Dependence on renal dialysis: Secondary | ICD-10-CM | POA: Diagnosis not present

## 2016-10-05 DIAGNOSIS — N186 End stage renal disease: Secondary | ICD-10-CM | POA: Diagnosis not present

## 2016-10-05 DIAGNOSIS — E213 Hyperparathyroidism, unspecified: Secondary | ICD-10-CM | POA: Diagnosis not present

## 2016-10-05 DIAGNOSIS — N2581 Secondary hyperparathyroidism of renal origin: Secondary | ICD-10-CM | POA: Diagnosis not present

## 2016-10-08 DIAGNOSIS — D509 Iron deficiency anemia, unspecified: Secondary | ICD-10-CM | POA: Diagnosis not present

## 2016-10-08 DIAGNOSIS — E213 Hyperparathyroidism, unspecified: Secondary | ICD-10-CM | POA: Diagnosis not present

## 2016-10-08 DIAGNOSIS — N186 End stage renal disease: Secondary | ICD-10-CM | POA: Diagnosis not present

## 2016-10-08 DIAGNOSIS — N2581 Secondary hyperparathyroidism of renal origin: Secondary | ICD-10-CM | POA: Diagnosis not present

## 2016-10-10 DIAGNOSIS — D509 Iron deficiency anemia, unspecified: Secondary | ICD-10-CM | POA: Diagnosis not present

## 2016-10-10 DIAGNOSIS — E213 Hyperparathyroidism, unspecified: Secondary | ICD-10-CM | POA: Diagnosis not present

## 2016-10-10 DIAGNOSIS — N2581 Secondary hyperparathyroidism of renal origin: Secondary | ICD-10-CM | POA: Diagnosis not present

## 2016-10-10 DIAGNOSIS — N186 End stage renal disease: Secondary | ICD-10-CM | POA: Diagnosis not present

## 2016-10-12 DIAGNOSIS — N186 End stage renal disease: Secondary | ICD-10-CM | POA: Diagnosis not present

## 2016-10-12 DIAGNOSIS — N2581 Secondary hyperparathyroidism of renal origin: Secondary | ICD-10-CM | POA: Diagnosis not present

## 2016-10-12 DIAGNOSIS — D509 Iron deficiency anemia, unspecified: Secondary | ICD-10-CM | POA: Diagnosis not present

## 2016-10-12 DIAGNOSIS — E213 Hyperparathyroidism, unspecified: Secondary | ICD-10-CM | POA: Diagnosis not present

## 2016-10-15 DIAGNOSIS — E213 Hyperparathyroidism, unspecified: Secondary | ICD-10-CM | POA: Diagnosis not present

## 2016-10-15 DIAGNOSIS — N186 End stage renal disease: Secondary | ICD-10-CM | POA: Diagnosis not present

## 2016-10-15 DIAGNOSIS — N2581 Secondary hyperparathyroidism of renal origin: Secondary | ICD-10-CM | POA: Diagnosis not present

## 2016-10-15 DIAGNOSIS — D509 Iron deficiency anemia, unspecified: Secondary | ICD-10-CM | POA: Diagnosis not present

## 2016-10-17 DIAGNOSIS — N186 End stage renal disease: Secondary | ICD-10-CM | POA: Diagnosis not present

## 2016-10-17 DIAGNOSIS — D509 Iron deficiency anemia, unspecified: Secondary | ICD-10-CM | POA: Diagnosis not present

## 2016-10-17 DIAGNOSIS — E213 Hyperparathyroidism, unspecified: Secondary | ICD-10-CM | POA: Diagnosis not present

## 2016-10-17 DIAGNOSIS — N2581 Secondary hyperparathyroidism of renal origin: Secondary | ICD-10-CM | POA: Diagnosis not present

## 2016-10-19 DIAGNOSIS — N186 End stage renal disease: Secondary | ICD-10-CM | POA: Diagnosis not present

## 2016-10-19 DIAGNOSIS — E213 Hyperparathyroidism, unspecified: Secondary | ICD-10-CM | POA: Diagnosis not present

## 2016-10-19 DIAGNOSIS — D509 Iron deficiency anemia, unspecified: Secondary | ICD-10-CM | POA: Diagnosis not present

## 2016-10-19 DIAGNOSIS — N2581 Secondary hyperparathyroidism of renal origin: Secondary | ICD-10-CM | POA: Diagnosis not present

## 2016-10-22 DIAGNOSIS — N186 End stage renal disease: Secondary | ICD-10-CM | POA: Diagnosis not present

## 2016-10-22 DIAGNOSIS — D509 Iron deficiency anemia, unspecified: Secondary | ICD-10-CM | POA: Diagnosis not present

## 2016-10-22 DIAGNOSIS — E213 Hyperparathyroidism, unspecified: Secondary | ICD-10-CM | POA: Diagnosis not present

## 2016-10-22 DIAGNOSIS — N2581 Secondary hyperparathyroidism of renal origin: Secondary | ICD-10-CM | POA: Diagnosis not present

## 2016-10-24 DIAGNOSIS — N2581 Secondary hyperparathyroidism of renal origin: Secondary | ICD-10-CM | POA: Diagnosis not present

## 2016-10-24 DIAGNOSIS — E213 Hyperparathyroidism, unspecified: Secondary | ICD-10-CM | POA: Diagnosis not present

## 2016-10-24 DIAGNOSIS — D509 Iron deficiency anemia, unspecified: Secondary | ICD-10-CM | POA: Diagnosis not present

## 2016-10-24 DIAGNOSIS — N186 End stage renal disease: Secondary | ICD-10-CM | POA: Diagnosis not present

## 2016-10-26 DIAGNOSIS — N2581 Secondary hyperparathyroidism of renal origin: Secondary | ICD-10-CM | POA: Diagnosis not present

## 2016-10-26 DIAGNOSIS — E213 Hyperparathyroidism, unspecified: Secondary | ICD-10-CM | POA: Diagnosis not present

## 2016-10-26 DIAGNOSIS — N186 End stage renal disease: Secondary | ICD-10-CM | POA: Diagnosis not present

## 2016-10-26 DIAGNOSIS — D509 Iron deficiency anemia, unspecified: Secondary | ICD-10-CM | POA: Diagnosis not present

## 2016-10-29 DIAGNOSIS — N2581 Secondary hyperparathyroidism of renal origin: Secondary | ICD-10-CM | POA: Diagnosis not present

## 2016-10-29 DIAGNOSIS — N186 End stage renal disease: Secondary | ICD-10-CM | POA: Diagnosis not present

## 2016-10-29 DIAGNOSIS — D509 Iron deficiency anemia, unspecified: Secondary | ICD-10-CM | POA: Diagnosis not present

## 2016-10-29 DIAGNOSIS — E213 Hyperparathyroidism, unspecified: Secondary | ICD-10-CM | POA: Diagnosis not present

## 2016-10-31 DIAGNOSIS — E213 Hyperparathyroidism, unspecified: Secondary | ICD-10-CM | POA: Diagnosis not present

## 2016-10-31 DIAGNOSIS — D509 Iron deficiency anemia, unspecified: Secondary | ICD-10-CM | POA: Diagnosis not present

## 2016-10-31 DIAGNOSIS — N2581 Secondary hyperparathyroidism of renal origin: Secondary | ICD-10-CM | POA: Diagnosis not present

## 2016-10-31 DIAGNOSIS — N186 End stage renal disease: Secondary | ICD-10-CM | POA: Diagnosis not present

## 2016-10-31 DIAGNOSIS — E1129 Type 2 diabetes mellitus with other diabetic kidney complication: Secondary | ICD-10-CM | POA: Diagnosis not present

## 2016-11-02 DIAGNOSIS — E213 Hyperparathyroidism, unspecified: Secondary | ICD-10-CM | POA: Diagnosis not present

## 2016-11-02 DIAGNOSIS — D509 Iron deficiency anemia, unspecified: Secondary | ICD-10-CM | POA: Diagnosis not present

## 2016-11-02 DIAGNOSIS — N2581 Secondary hyperparathyroidism of renal origin: Secondary | ICD-10-CM | POA: Diagnosis not present

## 2016-11-02 DIAGNOSIS — N186 End stage renal disease: Secondary | ICD-10-CM | POA: Diagnosis not present

## 2016-11-04 DIAGNOSIS — Z992 Dependence on renal dialysis: Secondary | ICD-10-CM | POA: Diagnosis not present

## 2016-11-04 DIAGNOSIS — I120 Hypertensive chronic kidney disease with stage 5 chronic kidney disease or end stage renal disease: Secondary | ICD-10-CM | POA: Diagnosis not present

## 2016-11-04 DIAGNOSIS — N186 End stage renal disease: Secondary | ICD-10-CM | POA: Diagnosis not present

## 2016-11-05 DIAGNOSIS — D509 Iron deficiency anemia, unspecified: Secondary | ICD-10-CM | POA: Diagnosis not present

## 2016-11-05 DIAGNOSIS — E213 Hyperparathyroidism, unspecified: Secondary | ICD-10-CM | POA: Diagnosis not present

## 2016-11-05 DIAGNOSIS — N2581 Secondary hyperparathyroidism of renal origin: Secondary | ICD-10-CM | POA: Diagnosis not present

## 2016-11-05 DIAGNOSIS — N186 End stage renal disease: Secondary | ICD-10-CM | POA: Diagnosis not present

## 2016-11-07 DIAGNOSIS — N186 End stage renal disease: Secondary | ICD-10-CM | POA: Diagnosis not present

## 2016-11-07 DIAGNOSIS — D509 Iron deficiency anemia, unspecified: Secondary | ICD-10-CM | POA: Diagnosis not present

## 2016-11-07 DIAGNOSIS — N2581 Secondary hyperparathyroidism of renal origin: Secondary | ICD-10-CM | POA: Diagnosis not present

## 2016-11-07 DIAGNOSIS — E213 Hyperparathyroidism, unspecified: Secondary | ICD-10-CM | POA: Diagnosis not present

## 2016-11-09 DIAGNOSIS — N186 End stage renal disease: Secondary | ICD-10-CM | POA: Diagnosis not present

## 2016-11-09 DIAGNOSIS — N2581 Secondary hyperparathyroidism of renal origin: Secondary | ICD-10-CM | POA: Diagnosis not present

## 2016-11-09 DIAGNOSIS — E213 Hyperparathyroidism, unspecified: Secondary | ICD-10-CM | POA: Diagnosis not present

## 2016-11-09 DIAGNOSIS — D509 Iron deficiency anemia, unspecified: Secondary | ICD-10-CM | POA: Diagnosis not present

## 2016-11-12 DIAGNOSIS — E213 Hyperparathyroidism, unspecified: Secondary | ICD-10-CM | POA: Diagnosis not present

## 2016-11-12 DIAGNOSIS — D509 Iron deficiency anemia, unspecified: Secondary | ICD-10-CM | POA: Diagnosis not present

## 2016-11-12 DIAGNOSIS — N2581 Secondary hyperparathyroidism of renal origin: Secondary | ICD-10-CM | POA: Diagnosis not present

## 2016-11-12 DIAGNOSIS — N186 End stage renal disease: Secondary | ICD-10-CM | POA: Diagnosis not present

## 2016-11-14 DIAGNOSIS — E213 Hyperparathyroidism, unspecified: Secondary | ICD-10-CM | POA: Diagnosis not present

## 2016-11-14 DIAGNOSIS — N2581 Secondary hyperparathyroidism of renal origin: Secondary | ICD-10-CM | POA: Diagnosis not present

## 2016-11-14 DIAGNOSIS — D509 Iron deficiency anemia, unspecified: Secondary | ICD-10-CM | POA: Diagnosis not present

## 2016-11-14 DIAGNOSIS — N186 End stage renal disease: Secondary | ICD-10-CM | POA: Diagnosis not present

## 2016-11-16 DIAGNOSIS — N2581 Secondary hyperparathyroidism of renal origin: Secondary | ICD-10-CM | POA: Diagnosis not present

## 2016-11-16 DIAGNOSIS — E213 Hyperparathyroidism, unspecified: Secondary | ICD-10-CM | POA: Diagnosis not present

## 2016-11-16 DIAGNOSIS — N186 End stage renal disease: Secondary | ICD-10-CM | POA: Diagnosis not present

## 2016-11-16 DIAGNOSIS — D509 Iron deficiency anemia, unspecified: Secondary | ICD-10-CM | POA: Diagnosis not present

## 2016-11-19 DIAGNOSIS — N186 End stage renal disease: Secondary | ICD-10-CM | POA: Diagnosis not present

## 2016-11-19 DIAGNOSIS — N2581 Secondary hyperparathyroidism of renal origin: Secondary | ICD-10-CM | POA: Diagnosis not present

## 2016-11-19 DIAGNOSIS — E213 Hyperparathyroidism, unspecified: Secondary | ICD-10-CM | POA: Diagnosis not present

## 2016-11-19 DIAGNOSIS — D509 Iron deficiency anemia, unspecified: Secondary | ICD-10-CM | POA: Diagnosis not present

## 2016-11-21 DIAGNOSIS — N2581 Secondary hyperparathyroidism of renal origin: Secondary | ICD-10-CM | POA: Diagnosis not present

## 2016-11-21 DIAGNOSIS — N186 End stage renal disease: Secondary | ICD-10-CM | POA: Diagnosis not present

## 2016-11-21 DIAGNOSIS — E213 Hyperparathyroidism, unspecified: Secondary | ICD-10-CM | POA: Diagnosis not present

## 2016-11-21 DIAGNOSIS — D509 Iron deficiency anemia, unspecified: Secondary | ICD-10-CM | POA: Diagnosis not present

## 2016-11-23 DIAGNOSIS — E213 Hyperparathyroidism, unspecified: Secondary | ICD-10-CM | POA: Diagnosis not present

## 2016-11-23 DIAGNOSIS — N186 End stage renal disease: Secondary | ICD-10-CM | POA: Diagnosis not present

## 2016-11-23 DIAGNOSIS — N2581 Secondary hyperparathyroidism of renal origin: Secondary | ICD-10-CM | POA: Diagnosis not present

## 2016-11-23 DIAGNOSIS — D509 Iron deficiency anemia, unspecified: Secondary | ICD-10-CM | POA: Diagnosis not present

## 2016-11-26 DIAGNOSIS — E213 Hyperparathyroidism, unspecified: Secondary | ICD-10-CM | POA: Diagnosis not present

## 2016-11-26 DIAGNOSIS — N186 End stage renal disease: Secondary | ICD-10-CM | POA: Diagnosis not present

## 2016-11-26 DIAGNOSIS — N2581 Secondary hyperparathyroidism of renal origin: Secondary | ICD-10-CM | POA: Diagnosis not present

## 2016-11-26 DIAGNOSIS — D509 Iron deficiency anemia, unspecified: Secondary | ICD-10-CM | POA: Diagnosis not present

## 2016-11-28 DIAGNOSIS — D509 Iron deficiency anemia, unspecified: Secondary | ICD-10-CM | POA: Diagnosis not present

## 2016-11-28 DIAGNOSIS — E213 Hyperparathyroidism, unspecified: Secondary | ICD-10-CM | POA: Diagnosis not present

## 2016-11-28 DIAGNOSIS — N2581 Secondary hyperparathyroidism of renal origin: Secondary | ICD-10-CM | POA: Diagnosis not present

## 2016-11-28 DIAGNOSIS — N186 End stage renal disease: Secondary | ICD-10-CM | POA: Diagnosis not present

## 2016-11-30 DIAGNOSIS — D509 Iron deficiency anemia, unspecified: Secondary | ICD-10-CM | POA: Diagnosis not present

## 2016-11-30 DIAGNOSIS — E213 Hyperparathyroidism, unspecified: Secondary | ICD-10-CM | POA: Diagnosis not present

## 2016-11-30 DIAGNOSIS — N186 End stage renal disease: Secondary | ICD-10-CM | POA: Diagnosis not present

## 2016-11-30 DIAGNOSIS — N2581 Secondary hyperparathyroidism of renal origin: Secondary | ICD-10-CM | POA: Diagnosis not present

## 2016-12-03 DIAGNOSIS — D509 Iron deficiency anemia, unspecified: Secondary | ICD-10-CM | POA: Diagnosis not present

## 2016-12-03 DIAGNOSIS — E213 Hyperparathyroidism, unspecified: Secondary | ICD-10-CM | POA: Diagnosis not present

## 2016-12-03 DIAGNOSIS — N2581 Secondary hyperparathyroidism of renal origin: Secondary | ICD-10-CM | POA: Diagnosis not present

## 2016-12-03 DIAGNOSIS — N186 End stage renal disease: Secondary | ICD-10-CM | POA: Diagnosis not present

## 2016-12-05 DIAGNOSIS — E213 Hyperparathyroidism, unspecified: Secondary | ICD-10-CM | POA: Diagnosis not present

## 2016-12-05 DIAGNOSIS — D509 Iron deficiency anemia, unspecified: Secondary | ICD-10-CM | POA: Diagnosis not present

## 2016-12-05 DIAGNOSIS — N2581 Secondary hyperparathyroidism of renal origin: Secondary | ICD-10-CM | POA: Diagnosis not present

## 2016-12-05 DIAGNOSIS — I120 Hypertensive chronic kidney disease with stage 5 chronic kidney disease or end stage renal disease: Secondary | ICD-10-CM | POA: Diagnosis not present

## 2016-12-05 DIAGNOSIS — Z992 Dependence on renal dialysis: Secondary | ICD-10-CM | POA: Diagnosis not present

## 2016-12-05 DIAGNOSIS — N186 End stage renal disease: Secondary | ICD-10-CM | POA: Diagnosis not present

## 2016-12-07 DIAGNOSIS — E213 Hyperparathyroidism, unspecified: Secondary | ICD-10-CM | POA: Diagnosis not present

## 2016-12-07 DIAGNOSIS — D509 Iron deficiency anemia, unspecified: Secondary | ICD-10-CM | POA: Diagnosis not present

## 2016-12-07 DIAGNOSIS — N186 End stage renal disease: Secondary | ICD-10-CM | POA: Diagnosis not present

## 2016-12-07 DIAGNOSIS — N2581 Secondary hyperparathyroidism of renal origin: Secondary | ICD-10-CM | POA: Diagnosis not present

## 2016-12-10 DIAGNOSIS — N2581 Secondary hyperparathyroidism of renal origin: Secondary | ICD-10-CM | POA: Diagnosis not present

## 2016-12-10 DIAGNOSIS — E213 Hyperparathyroidism, unspecified: Secondary | ICD-10-CM | POA: Diagnosis not present

## 2016-12-10 DIAGNOSIS — N186 End stage renal disease: Secondary | ICD-10-CM | POA: Diagnosis not present

## 2016-12-10 DIAGNOSIS — D509 Iron deficiency anemia, unspecified: Secondary | ICD-10-CM | POA: Diagnosis not present

## 2016-12-12 DIAGNOSIS — N186 End stage renal disease: Secondary | ICD-10-CM | POA: Diagnosis not present

## 2016-12-12 DIAGNOSIS — N2581 Secondary hyperparathyroidism of renal origin: Secondary | ICD-10-CM | POA: Diagnosis not present

## 2016-12-12 DIAGNOSIS — E213 Hyperparathyroidism, unspecified: Secondary | ICD-10-CM | POA: Diagnosis not present

## 2016-12-12 DIAGNOSIS — D509 Iron deficiency anemia, unspecified: Secondary | ICD-10-CM | POA: Diagnosis not present

## 2016-12-14 DIAGNOSIS — D509 Iron deficiency anemia, unspecified: Secondary | ICD-10-CM | POA: Diagnosis not present

## 2016-12-14 DIAGNOSIS — E213 Hyperparathyroidism, unspecified: Secondary | ICD-10-CM | POA: Diagnosis not present

## 2016-12-14 DIAGNOSIS — N186 End stage renal disease: Secondary | ICD-10-CM | POA: Diagnosis not present

## 2016-12-14 DIAGNOSIS — N2581 Secondary hyperparathyroidism of renal origin: Secondary | ICD-10-CM | POA: Diagnosis not present

## 2016-12-17 DIAGNOSIS — N2581 Secondary hyperparathyroidism of renal origin: Secondary | ICD-10-CM | POA: Diagnosis not present

## 2016-12-17 DIAGNOSIS — D509 Iron deficiency anemia, unspecified: Secondary | ICD-10-CM | POA: Diagnosis not present

## 2016-12-17 DIAGNOSIS — N186 End stage renal disease: Secondary | ICD-10-CM | POA: Diagnosis not present

## 2016-12-17 DIAGNOSIS — E213 Hyperparathyroidism, unspecified: Secondary | ICD-10-CM | POA: Diagnosis not present

## 2016-12-19 DIAGNOSIS — E213 Hyperparathyroidism, unspecified: Secondary | ICD-10-CM | POA: Diagnosis not present

## 2016-12-19 DIAGNOSIS — D509 Iron deficiency anemia, unspecified: Secondary | ICD-10-CM | POA: Diagnosis not present

## 2016-12-19 DIAGNOSIS — N2581 Secondary hyperparathyroidism of renal origin: Secondary | ICD-10-CM | POA: Diagnosis not present

## 2016-12-19 DIAGNOSIS — N186 End stage renal disease: Secondary | ICD-10-CM | POA: Diagnosis not present

## 2016-12-21 DIAGNOSIS — E213 Hyperparathyroidism, unspecified: Secondary | ICD-10-CM | POA: Diagnosis not present

## 2016-12-21 DIAGNOSIS — D509 Iron deficiency anemia, unspecified: Secondary | ICD-10-CM | POA: Diagnosis not present

## 2016-12-21 DIAGNOSIS — N2581 Secondary hyperparathyroidism of renal origin: Secondary | ICD-10-CM | POA: Diagnosis not present

## 2016-12-21 DIAGNOSIS — N186 End stage renal disease: Secondary | ICD-10-CM | POA: Diagnosis not present

## 2016-12-24 DIAGNOSIS — N186 End stage renal disease: Secondary | ICD-10-CM | POA: Diagnosis not present

## 2016-12-24 DIAGNOSIS — N2581 Secondary hyperparathyroidism of renal origin: Secondary | ICD-10-CM | POA: Diagnosis not present

## 2016-12-24 DIAGNOSIS — E213 Hyperparathyroidism, unspecified: Secondary | ICD-10-CM | POA: Diagnosis not present

## 2016-12-24 DIAGNOSIS — D509 Iron deficiency anemia, unspecified: Secondary | ICD-10-CM | POA: Diagnosis not present

## 2016-12-26 DIAGNOSIS — D509 Iron deficiency anemia, unspecified: Secondary | ICD-10-CM | POA: Diagnosis not present

## 2016-12-26 DIAGNOSIS — N2581 Secondary hyperparathyroidism of renal origin: Secondary | ICD-10-CM | POA: Diagnosis not present

## 2016-12-26 DIAGNOSIS — N186 End stage renal disease: Secondary | ICD-10-CM | POA: Diagnosis not present

## 2016-12-26 DIAGNOSIS — E213 Hyperparathyroidism, unspecified: Secondary | ICD-10-CM | POA: Diagnosis not present

## 2016-12-28 DIAGNOSIS — N186 End stage renal disease: Secondary | ICD-10-CM | POA: Diagnosis not present

## 2016-12-28 DIAGNOSIS — D509 Iron deficiency anemia, unspecified: Secondary | ICD-10-CM | POA: Diagnosis not present

## 2016-12-28 DIAGNOSIS — N2581 Secondary hyperparathyroidism of renal origin: Secondary | ICD-10-CM | POA: Diagnosis not present

## 2016-12-28 DIAGNOSIS — E213 Hyperparathyroidism, unspecified: Secondary | ICD-10-CM | POA: Diagnosis not present

## 2016-12-31 DIAGNOSIS — D509 Iron deficiency anemia, unspecified: Secondary | ICD-10-CM | POA: Diagnosis not present

## 2016-12-31 DIAGNOSIS — N186 End stage renal disease: Secondary | ICD-10-CM | POA: Diagnosis not present

## 2016-12-31 DIAGNOSIS — N2581 Secondary hyperparathyroidism of renal origin: Secondary | ICD-10-CM | POA: Diagnosis not present

## 2016-12-31 DIAGNOSIS — E213 Hyperparathyroidism, unspecified: Secondary | ICD-10-CM | POA: Diagnosis not present

## 2017-01-02 DIAGNOSIS — N2581 Secondary hyperparathyroidism of renal origin: Secondary | ICD-10-CM | POA: Diagnosis not present

## 2017-01-02 DIAGNOSIS — E213 Hyperparathyroidism, unspecified: Secondary | ICD-10-CM | POA: Diagnosis not present

## 2017-01-02 DIAGNOSIS — N186 End stage renal disease: Secondary | ICD-10-CM | POA: Diagnosis not present

## 2017-01-02 DIAGNOSIS — D509 Iron deficiency anemia, unspecified: Secondary | ICD-10-CM | POA: Diagnosis not present

## 2017-01-04 DIAGNOSIS — D509 Iron deficiency anemia, unspecified: Secondary | ICD-10-CM | POA: Diagnosis not present

## 2017-01-04 DIAGNOSIS — Z992 Dependence on renal dialysis: Secondary | ICD-10-CM | POA: Diagnosis not present

## 2017-01-04 DIAGNOSIS — E213 Hyperparathyroidism, unspecified: Secondary | ICD-10-CM | POA: Diagnosis not present

## 2017-01-04 DIAGNOSIS — N2581 Secondary hyperparathyroidism of renal origin: Secondary | ICD-10-CM | POA: Diagnosis not present

## 2017-01-04 DIAGNOSIS — I120 Hypertensive chronic kidney disease with stage 5 chronic kidney disease or end stage renal disease: Secondary | ICD-10-CM | POA: Diagnosis not present

## 2017-01-04 DIAGNOSIS — N186 End stage renal disease: Secondary | ICD-10-CM | POA: Diagnosis not present

## 2017-01-07 DIAGNOSIS — E213 Hyperparathyroidism, unspecified: Secondary | ICD-10-CM | POA: Diagnosis not present

## 2017-01-07 DIAGNOSIS — N186 End stage renal disease: Secondary | ICD-10-CM | POA: Diagnosis not present

## 2017-01-07 DIAGNOSIS — D509 Iron deficiency anemia, unspecified: Secondary | ICD-10-CM | POA: Diagnosis not present

## 2017-01-07 DIAGNOSIS — N2581 Secondary hyperparathyroidism of renal origin: Secondary | ICD-10-CM | POA: Diagnosis not present

## 2017-01-09 DIAGNOSIS — N186 End stage renal disease: Secondary | ICD-10-CM | POA: Diagnosis not present

## 2017-01-09 DIAGNOSIS — N2581 Secondary hyperparathyroidism of renal origin: Secondary | ICD-10-CM | POA: Diagnosis not present

## 2017-01-09 DIAGNOSIS — E213 Hyperparathyroidism, unspecified: Secondary | ICD-10-CM | POA: Diagnosis not present

## 2017-01-09 DIAGNOSIS — D509 Iron deficiency anemia, unspecified: Secondary | ICD-10-CM | POA: Diagnosis not present

## 2017-01-11 DIAGNOSIS — D509 Iron deficiency anemia, unspecified: Secondary | ICD-10-CM | POA: Diagnosis not present

## 2017-01-11 DIAGNOSIS — N186 End stage renal disease: Secondary | ICD-10-CM | POA: Diagnosis not present

## 2017-01-11 DIAGNOSIS — E213 Hyperparathyroidism, unspecified: Secondary | ICD-10-CM | POA: Diagnosis not present

## 2017-01-11 DIAGNOSIS — N2581 Secondary hyperparathyroidism of renal origin: Secondary | ICD-10-CM | POA: Diagnosis not present

## 2017-01-14 DIAGNOSIS — D509 Iron deficiency anemia, unspecified: Secondary | ICD-10-CM | POA: Diagnosis not present

## 2017-01-14 DIAGNOSIS — N2581 Secondary hyperparathyroidism of renal origin: Secondary | ICD-10-CM | POA: Diagnosis not present

## 2017-01-14 DIAGNOSIS — E213 Hyperparathyroidism, unspecified: Secondary | ICD-10-CM | POA: Diagnosis not present

## 2017-01-14 DIAGNOSIS — N186 End stage renal disease: Secondary | ICD-10-CM | POA: Diagnosis not present

## 2017-01-16 DIAGNOSIS — N2581 Secondary hyperparathyroidism of renal origin: Secondary | ICD-10-CM | POA: Diagnosis not present

## 2017-01-16 DIAGNOSIS — D509 Iron deficiency anemia, unspecified: Secondary | ICD-10-CM | POA: Diagnosis not present

## 2017-01-16 DIAGNOSIS — E213 Hyperparathyroidism, unspecified: Secondary | ICD-10-CM | POA: Diagnosis not present

## 2017-01-16 DIAGNOSIS — N186 End stage renal disease: Secondary | ICD-10-CM | POA: Diagnosis not present

## 2017-01-18 DIAGNOSIS — D509 Iron deficiency anemia, unspecified: Secondary | ICD-10-CM | POA: Diagnosis not present

## 2017-01-18 DIAGNOSIS — E213 Hyperparathyroidism, unspecified: Secondary | ICD-10-CM | POA: Diagnosis not present

## 2017-01-18 DIAGNOSIS — N2581 Secondary hyperparathyroidism of renal origin: Secondary | ICD-10-CM | POA: Diagnosis not present

## 2017-01-18 DIAGNOSIS — N186 End stage renal disease: Secondary | ICD-10-CM | POA: Diagnosis not present

## 2017-01-21 DIAGNOSIS — N186 End stage renal disease: Secondary | ICD-10-CM | POA: Diagnosis not present

## 2017-01-21 DIAGNOSIS — D509 Iron deficiency anemia, unspecified: Secondary | ICD-10-CM | POA: Diagnosis not present

## 2017-01-21 DIAGNOSIS — N2581 Secondary hyperparathyroidism of renal origin: Secondary | ICD-10-CM | POA: Diagnosis not present

## 2017-01-21 DIAGNOSIS — E213 Hyperparathyroidism, unspecified: Secondary | ICD-10-CM | POA: Diagnosis not present

## 2017-01-23 DIAGNOSIS — E213 Hyperparathyroidism, unspecified: Secondary | ICD-10-CM | POA: Diagnosis not present

## 2017-01-23 DIAGNOSIS — D509 Iron deficiency anemia, unspecified: Secondary | ICD-10-CM | POA: Diagnosis not present

## 2017-01-23 DIAGNOSIS — N2581 Secondary hyperparathyroidism of renal origin: Secondary | ICD-10-CM | POA: Diagnosis not present

## 2017-01-23 DIAGNOSIS — N186 End stage renal disease: Secondary | ICD-10-CM | POA: Diagnosis not present

## 2017-01-25 DIAGNOSIS — E213 Hyperparathyroidism, unspecified: Secondary | ICD-10-CM | POA: Diagnosis not present

## 2017-01-25 DIAGNOSIS — N186 End stage renal disease: Secondary | ICD-10-CM | POA: Diagnosis not present

## 2017-01-25 DIAGNOSIS — D509 Iron deficiency anemia, unspecified: Secondary | ICD-10-CM | POA: Diagnosis not present

## 2017-01-25 DIAGNOSIS — N2581 Secondary hyperparathyroidism of renal origin: Secondary | ICD-10-CM | POA: Diagnosis not present

## 2017-01-28 DIAGNOSIS — N2581 Secondary hyperparathyroidism of renal origin: Secondary | ICD-10-CM | POA: Diagnosis not present

## 2017-01-28 DIAGNOSIS — N186 End stage renal disease: Secondary | ICD-10-CM | POA: Diagnosis not present

## 2017-01-28 DIAGNOSIS — D509 Iron deficiency anemia, unspecified: Secondary | ICD-10-CM | POA: Diagnosis not present

## 2017-01-28 DIAGNOSIS — E213 Hyperparathyroidism, unspecified: Secondary | ICD-10-CM | POA: Diagnosis not present

## 2017-01-30 DIAGNOSIS — D509 Iron deficiency anemia, unspecified: Secondary | ICD-10-CM | POA: Diagnosis not present

## 2017-01-30 DIAGNOSIS — E213 Hyperparathyroidism, unspecified: Secondary | ICD-10-CM | POA: Diagnosis not present

## 2017-01-30 DIAGNOSIS — N186 End stage renal disease: Secondary | ICD-10-CM | POA: Diagnosis not present

## 2017-01-30 DIAGNOSIS — N2581 Secondary hyperparathyroidism of renal origin: Secondary | ICD-10-CM | POA: Diagnosis not present

## 2017-01-30 DIAGNOSIS — E1129 Type 2 diabetes mellitus with other diabetic kidney complication: Secondary | ICD-10-CM | POA: Diagnosis not present

## 2017-02-01 DIAGNOSIS — N186 End stage renal disease: Secondary | ICD-10-CM | POA: Diagnosis not present

## 2017-02-01 DIAGNOSIS — E213 Hyperparathyroidism, unspecified: Secondary | ICD-10-CM | POA: Diagnosis not present

## 2017-02-01 DIAGNOSIS — N2581 Secondary hyperparathyroidism of renal origin: Secondary | ICD-10-CM | POA: Diagnosis not present

## 2017-02-01 DIAGNOSIS — D509 Iron deficiency anemia, unspecified: Secondary | ICD-10-CM | POA: Diagnosis not present

## 2017-02-04 DIAGNOSIS — I120 Hypertensive chronic kidney disease with stage 5 chronic kidney disease or end stage renal disease: Secondary | ICD-10-CM | POA: Diagnosis not present

## 2017-02-04 DIAGNOSIS — N186 End stage renal disease: Secondary | ICD-10-CM | POA: Diagnosis not present

## 2017-02-04 DIAGNOSIS — N2581 Secondary hyperparathyroidism of renal origin: Secondary | ICD-10-CM | POA: Diagnosis not present

## 2017-02-04 DIAGNOSIS — E213 Hyperparathyroidism, unspecified: Secondary | ICD-10-CM | POA: Diagnosis not present

## 2017-02-04 DIAGNOSIS — D509 Iron deficiency anemia, unspecified: Secondary | ICD-10-CM | POA: Diagnosis not present

## 2017-02-04 DIAGNOSIS — Z992 Dependence on renal dialysis: Secondary | ICD-10-CM | POA: Diagnosis not present

## 2017-02-06 DIAGNOSIS — N186 End stage renal disease: Secondary | ICD-10-CM | POA: Diagnosis not present

## 2017-02-06 DIAGNOSIS — N2581 Secondary hyperparathyroidism of renal origin: Secondary | ICD-10-CM | POA: Diagnosis not present

## 2017-02-06 DIAGNOSIS — E213 Hyperparathyroidism, unspecified: Secondary | ICD-10-CM | POA: Diagnosis not present

## 2017-02-06 DIAGNOSIS — D509 Iron deficiency anemia, unspecified: Secondary | ICD-10-CM | POA: Diagnosis not present

## 2017-02-08 DIAGNOSIS — N2581 Secondary hyperparathyroidism of renal origin: Secondary | ICD-10-CM | POA: Diagnosis not present

## 2017-02-08 DIAGNOSIS — E213 Hyperparathyroidism, unspecified: Secondary | ICD-10-CM | POA: Diagnosis not present

## 2017-02-08 DIAGNOSIS — D509 Iron deficiency anemia, unspecified: Secondary | ICD-10-CM | POA: Diagnosis not present

## 2017-02-08 DIAGNOSIS — N186 End stage renal disease: Secondary | ICD-10-CM | POA: Diagnosis not present

## 2017-02-11 DIAGNOSIS — N186 End stage renal disease: Secondary | ICD-10-CM | POA: Diagnosis not present

## 2017-02-11 DIAGNOSIS — D509 Iron deficiency anemia, unspecified: Secondary | ICD-10-CM | POA: Diagnosis not present

## 2017-02-11 DIAGNOSIS — N2581 Secondary hyperparathyroidism of renal origin: Secondary | ICD-10-CM | POA: Diagnosis not present

## 2017-02-11 DIAGNOSIS — E213 Hyperparathyroidism, unspecified: Secondary | ICD-10-CM | POA: Diagnosis not present

## 2017-02-13 DIAGNOSIS — N186 End stage renal disease: Secondary | ICD-10-CM | POA: Diagnosis not present

## 2017-02-13 DIAGNOSIS — E213 Hyperparathyroidism, unspecified: Secondary | ICD-10-CM | POA: Diagnosis not present

## 2017-02-13 DIAGNOSIS — D509 Iron deficiency anemia, unspecified: Secondary | ICD-10-CM | POA: Diagnosis not present

## 2017-02-13 DIAGNOSIS — N2581 Secondary hyperparathyroidism of renal origin: Secondary | ICD-10-CM | POA: Diagnosis not present

## 2017-02-15 DIAGNOSIS — N186 End stage renal disease: Secondary | ICD-10-CM | POA: Diagnosis not present

## 2017-02-15 DIAGNOSIS — N2581 Secondary hyperparathyroidism of renal origin: Secondary | ICD-10-CM | POA: Diagnosis not present

## 2017-02-15 DIAGNOSIS — E213 Hyperparathyroidism, unspecified: Secondary | ICD-10-CM | POA: Diagnosis not present

## 2017-02-15 DIAGNOSIS — D509 Iron deficiency anemia, unspecified: Secondary | ICD-10-CM | POA: Diagnosis not present

## 2017-02-18 DIAGNOSIS — N2581 Secondary hyperparathyroidism of renal origin: Secondary | ICD-10-CM | POA: Diagnosis not present

## 2017-02-18 DIAGNOSIS — E213 Hyperparathyroidism, unspecified: Secondary | ICD-10-CM | POA: Diagnosis not present

## 2017-02-18 DIAGNOSIS — D509 Iron deficiency anemia, unspecified: Secondary | ICD-10-CM | POA: Diagnosis not present

## 2017-02-18 DIAGNOSIS — N186 End stage renal disease: Secondary | ICD-10-CM | POA: Diagnosis not present

## 2017-02-20 DIAGNOSIS — N186 End stage renal disease: Secondary | ICD-10-CM | POA: Diagnosis not present

## 2017-02-20 DIAGNOSIS — N2581 Secondary hyperparathyroidism of renal origin: Secondary | ICD-10-CM | POA: Diagnosis not present

## 2017-02-20 DIAGNOSIS — E213 Hyperparathyroidism, unspecified: Secondary | ICD-10-CM | POA: Diagnosis not present

## 2017-02-20 DIAGNOSIS — D509 Iron deficiency anemia, unspecified: Secondary | ICD-10-CM | POA: Diagnosis not present

## 2017-02-22 DIAGNOSIS — D509 Iron deficiency anemia, unspecified: Secondary | ICD-10-CM | POA: Diagnosis not present

## 2017-02-22 DIAGNOSIS — E213 Hyperparathyroidism, unspecified: Secondary | ICD-10-CM | POA: Diagnosis not present

## 2017-02-22 DIAGNOSIS — N186 End stage renal disease: Secondary | ICD-10-CM | POA: Diagnosis not present

## 2017-02-22 DIAGNOSIS — N2581 Secondary hyperparathyroidism of renal origin: Secondary | ICD-10-CM | POA: Diagnosis not present

## 2017-02-25 DIAGNOSIS — D509 Iron deficiency anemia, unspecified: Secondary | ICD-10-CM | POA: Diagnosis not present

## 2017-02-25 DIAGNOSIS — E213 Hyperparathyroidism, unspecified: Secondary | ICD-10-CM | POA: Diagnosis not present

## 2017-02-25 DIAGNOSIS — N186 End stage renal disease: Secondary | ICD-10-CM | POA: Diagnosis not present

## 2017-02-25 DIAGNOSIS — N2581 Secondary hyperparathyroidism of renal origin: Secondary | ICD-10-CM | POA: Diagnosis not present

## 2017-02-27 DIAGNOSIS — D509 Iron deficiency anemia, unspecified: Secondary | ICD-10-CM | POA: Diagnosis not present

## 2017-02-27 DIAGNOSIS — N186 End stage renal disease: Secondary | ICD-10-CM | POA: Diagnosis not present

## 2017-02-27 DIAGNOSIS — E213 Hyperparathyroidism, unspecified: Secondary | ICD-10-CM | POA: Diagnosis not present

## 2017-02-27 DIAGNOSIS — N2581 Secondary hyperparathyroidism of renal origin: Secondary | ICD-10-CM | POA: Diagnosis not present

## 2017-03-01 DIAGNOSIS — N2581 Secondary hyperparathyroidism of renal origin: Secondary | ICD-10-CM | POA: Diagnosis not present

## 2017-03-01 DIAGNOSIS — D509 Iron deficiency anemia, unspecified: Secondary | ICD-10-CM | POA: Diagnosis not present

## 2017-03-01 DIAGNOSIS — N186 End stage renal disease: Secondary | ICD-10-CM | POA: Diagnosis not present

## 2017-03-01 DIAGNOSIS — E213 Hyperparathyroidism, unspecified: Secondary | ICD-10-CM | POA: Diagnosis not present

## 2017-03-04 DIAGNOSIS — E213 Hyperparathyroidism, unspecified: Secondary | ICD-10-CM | POA: Diagnosis not present

## 2017-03-04 DIAGNOSIS — N2581 Secondary hyperparathyroidism of renal origin: Secondary | ICD-10-CM | POA: Diagnosis not present

## 2017-03-04 DIAGNOSIS — N186 End stage renal disease: Secondary | ICD-10-CM | POA: Diagnosis not present

## 2017-03-04 DIAGNOSIS — D509 Iron deficiency anemia, unspecified: Secondary | ICD-10-CM | POA: Diagnosis not present

## 2017-03-06 DIAGNOSIS — E213 Hyperparathyroidism, unspecified: Secondary | ICD-10-CM | POA: Diagnosis not present

## 2017-03-06 DIAGNOSIS — D509 Iron deficiency anemia, unspecified: Secondary | ICD-10-CM | POA: Diagnosis not present

## 2017-03-06 DIAGNOSIS — N2581 Secondary hyperparathyroidism of renal origin: Secondary | ICD-10-CM | POA: Diagnosis not present

## 2017-03-06 DIAGNOSIS — N186 End stage renal disease: Secondary | ICD-10-CM | POA: Diagnosis not present

## 2017-03-07 DIAGNOSIS — N186 End stage renal disease: Secondary | ICD-10-CM | POA: Diagnosis not present

## 2017-03-07 DIAGNOSIS — I120 Hypertensive chronic kidney disease with stage 5 chronic kidney disease or end stage renal disease: Secondary | ICD-10-CM | POA: Diagnosis not present

## 2017-03-07 DIAGNOSIS — Z992 Dependence on renal dialysis: Secondary | ICD-10-CM | POA: Diagnosis not present

## 2017-03-08 DIAGNOSIS — N2581 Secondary hyperparathyroidism of renal origin: Secondary | ICD-10-CM | POA: Diagnosis not present

## 2017-03-08 DIAGNOSIS — N186 End stage renal disease: Secondary | ICD-10-CM | POA: Diagnosis not present

## 2017-03-08 DIAGNOSIS — D509 Iron deficiency anemia, unspecified: Secondary | ICD-10-CM | POA: Diagnosis not present

## 2017-03-08 DIAGNOSIS — E213 Hyperparathyroidism, unspecified: Secondary | ICD-10-CM | POA: Diagnosis not present

## 2017-03-11 DIAGNOSIS — N186 End stage renal disease: Secondary | ICD-10-CM | POA: Diagnosis not present

## 2017-03-11 DIAGNOSIS — N2581 Secondary hyperparathyroidism of renal origin: Secondary | ICD-10-CM | POA: Diagnosis not present

## 2017-03-11 DIAGNOSIS — D509 Iron deficiency anemia, unspecified: Secondary | ICD-10-CM | POA: Diagnosis not present

## 2017-03-11 DIAGNOSIS — E213 Hyperparathyroidism, unspecified: Secondary | ICD-10-CM | POA: Diagnosis not present

## 2017-03-13 DIAGNOSIS — N186 End stage renal disease: Secondary | ICD-10-CM | POA: Diagnosis not present

## 2017-03-13 DIAGNOSIS — E213 Hyperparathyroidism, unspecified: Secondary | ICD-10-CM | POA: Diagnosis not present

## 2017-03-13 DIAGNOSIS — D509 Iron deficiency anemia, unspecified: Secondary | ICD-10-CM | POA: Diagnosis not present

## 2017-03-13 DIAGNOSIS — N2581 Secondary hyperparathyroidism of renal origin: Secondary | ICD-10-CM | POA: Diagnosis not present

## 2017-03-14 DIAGNOSIS — I871 Compression of vein: Secondary | ICD-10-CM | POA: Diagnosis not present

## 2017-03-14 DIAGNOSIS — N186 End stage renal disease: Secondary | ICD-10-CM | POA: Diagnosis not present

## 2017-03-14 DIAGNOSIS — Z992 Dependence on renal dialysis: Secondary | ICD-10-CM | POA: Diagnosis not present

## 2017-03-15 DIAGNOSIS — E213 Hyperparathyroidism, unspecified: Secondary | ICD-10-CM | POA: Diagnosis not present

## 2017-03-15 DIAGNOSIS — N186 End stage renal disease: Secondary | ICD-10-CM | POA: Diagnosis not present

## 2017-03-15 DIAGNOSIS — N2581 Secondary hyperparathyroidism of renal origin: Secondary | ICD-10-CM | POA: Diagnosis not present

## 2017-03-15 DIAGNOSIS — D509 Iron deficiency anemia, unspecified: Secondary | ICD-10-CM | POA: Diagnosis not present

## 2017-03-18 DIAGNOSIS — N186 End stage renal disease: Secondary | ICD-10-CM | POA: Diagnosis not present

## 2017-03-18 DIAGNOSIS — D509 Iron deficiency anemia, unspecified: Secondary | ICD-10-CM | POA: Diagnosis not present

## 2017-03-18 DIAGNOSIS — N2581 Secondary hyperparathyroidism of renal origin: Secondary | ICD-10-CM | POA: Diagnosis not present

## 2017-03-18 DIAGNOSIS — E213 Hyperparathyroidism, unspecified: Secondary | ICD-10-CM | POA: Diagnosis not present

## 2017-03-20 DIAGNOSIS — D509 Iron deficiency anemia, unspecified: Secondary | ICD-10-CM | POA: Diagnosis not present

## 2017-03-20 DIAGNOSIS — E213 Hyperparathyroidism, unspecified: Secondary | ICD-10-CM | POA: Diagnosis not present

## 2017-03-20 DIAGNOSIS — N186 End stage renal disease: Secondary | ICD-10-CM | POA: Diagnosis not present

## 2017-03-20 DIAGNOSIS — N2581 Secondary hyperparathyroidism of renal origin: Secondary | ICD-10-CM | POA: Diagnosis not present

## 2017-03-22 DIAGNOSIS — D509 Iron deficiency anemia, unspecified: Secondary | ICD-10-CM | POA: Diagnosis not present

## 2017-03-22 DIAGNOSIS — E213 Hyperparathyroidism, unspecified: Secondary | ICD-10-CM | POA: Diagnosis not present

## 2017-03-22 DIAGNOSIS — N186 End stage renal disease: Secondary | ICD-10-CM | POA: Diagnosis not present

## 2017-03-22 DIAGNOSIS — N2581 Secondary hyperparathyroidism of renal origin: Secondary | ICD-10-CM | POA: Diagnosis not present

## 2017-03-25 DIAGNOSIS — D509 Iron deficiency anemia, unspecified: Secondary | ICD-10-CM | POA: Diagnosis not present

## 2017-03-25 DIAGNOSIS — N186 End stage renal disease: Secondary | ICD-10-CM | POA: Diagnosis not present

## 2017-03-25 DIAGNOSIS — N2581 Secondary hyperparathyroidism of renal origin: Secondary | ICD-10-CM | POA: Diagnosis not present

## 2017-03-25 DIAGNOSIS — E213 Hyperparathyroidism, unspecified: Secondary | ICD-10-CM | POA: Diagnosis not present

## 2017-03-27 DIAGNOSIS — N186 End stage renal disease: Secondary | ICD-10-CM | POA: Diagnosis not present

## 2017-03-27 DIAGNOSIS — N2581 Secondary hyperparathyroidism of renal origin: Secondary | ICD-10-CM | POA: Diagnosis not present

## 2017-03-27 DIAGNOSIS — E213 Hyperparathyroidism, unspecified: Secondary | ICD-10-CM | POA: Diagnosis not present

## 2017-03-27 DIAGNOSIS — D509 Iron deficiency anemia, unspecified: Secondary | ICD-10-CM | POA: Diagnosis not present

## 2017-03-29 DIAGNOSIS — N186 End stage renal disease: Secondary | ICD-10-CM | POA: Diagnosis not present

## 2017-03-29 DIAGNOSIS — E213 Hyperparathyroidism, unspecified: Secondary | ICD-10-CM | POA: Diagnosis not present

## 2017-03-29 DIAGNOSIS — D509 Iron deficiency anemia, unspecified: Secondary | ICD-10-CM | POA: Diagnosis not present

## 2017-03-29 DIAGNOSIS — N2581 Secondary hyperparathyroidism of renal origin: Secondary | ICD-10-CM | POA: Diagnosis not present

## 2017-04-01 DIAGNOSIS — N186 End stage renal disease: Secondary | ICD-10-CM | POA: Diagnosis not present

## 2017-04-01 DIAGNOSIS — D509 Iron deficiency anemia, unspecified: Secondary | ICD-10-CM | POA: Diagnosis not present

## 2017-04-01 DIAGNOSIS — E213 Hyperparathyroidism, unspecified: Secondary | ICD-10-CM | POA: Diagnosis not present

## 2017-04-01 DIAGNOSIS — N2581 Secondary hyperparathyroidism of renal origin: Secondary | ICD-10-CM | POA: Diagnosis not present

## 2017-04-03 DIAGNOSIS — D509 Iron deficiency anemia, unspecified: Secondary | ICD-10-CM | POA: Diagnosis not present

## 2017-04-03 DIAGNOSIS — N2581 Secondary hyperparathyroidism of renal origin: Secondary | ICD-10-CM | POA: Diagnosis not present

## 2017-04-03 DIAGNOSIS — E213 Hyperparathyroidism, unspecified: Secondary | ICD-10-CM | POA: Diagnosis not present

## 2017-04-03 DIAGNOSIS — N186 End stage renal disease: Secondary | ICD-10-CM | POA: Diagnosis not present

## 2017-04-05 DIAGNOSIS — N186 End stage renal disease: Secondary | ICD-10-CM | POA: Diagnosis not present

## 2017-04-05 DIAGNOSIS — E213 Hyperparathyroidism, unspecified: Secondary | ICD-10-CM | POA: Diagnosis not present

## 2017-04-05 DIAGNOSIS — D509 Iron deficiency anemia, unspecified: Secondary | ICD-10-CM | POA: Diagnosis not present

## 2017-04-05 DIAGNOSIS — N2581 Secondary hyperparathyroidism of renal origin: Secondary | ICD-10-CM | POA: Diagnosis not present

## 2017-04-06 DIAGNOSIS — I120 Hypertensive chronic kidney disease with stage 5 chronic kidney disease or end stage renal disease: Secondary | ICD-10-CM | POA: Diagnosis not present

## 2017-04-06 DIAGNOSIS — Z992 Dependence on renal dialysis: Secondary | ICD-10-CM | POA: Diagnosis not present

## 2017-04-06 DIAGNOSIS — N186 End stage renal disease: Secondary | ICD-10-CM | POA: Diagnosis not present

## 2017-04-08 DIAGNOSIS — N186 End stage renal disease: Secondary | ICD-10-CM | POA: Diagnosis not present

## 2017-04-08 DIAGNOSIS — N2581 Secondary hyperparathyroidism of renal origin: Secondary | ICD-10-CM | POA: Diagnosis not present

## 2017-04-08 DIAGNOSIS — D509 Iron deficiency anemia, unspecified: Secondary | ICD-10-CM | POA: Diagnosis not present

## 2017-04-08 DIAGNOSIS — E213 Hyperparathyroidism, unspecified: Secondary | ICD-10-CM | POA: Diagnosis not present

## 2017-04-10 DIAGNOSIS — E213 Hyperparathyroidism, unspecified: Secondary | ICD-10-CM | POA: Diagnosis not present

## 2017-04-10 DIAGNOSIS — D509 Iron deficiency anemia, unspecified: Secondary | ICD-10-CM | POA: Diagnosis not present

## 2017-04-10 DIAGNOSIS — N186 End stage renal disease: Secondary | ICD-10-CM | POA: Diagnosis not present

## 2017-04-10 DIAGNOSIS — N2581 Secondary hyperparathyroidism of renal origin: Secondary | ICD-10-CM | POA: Diagnosis not present

## 2017-04-12 DIAGNOSIS — N186 End stage renal disease: Secondary | ICD-10-CM | POA: Diagnosis not present

## 2017-04-12 DIAGNOSIS — D509 Iron deficiency anemia, unspecified: Secondary | ICD-10-CM | POA: Diagnosis not present

## 2017-04-12 DIAGNOSIS — E213 Hyperparathyroidism, unspecified: Secondary | ICD-10-CM | POA: Diagnosis not present

## 2017-04-12 DIAGNOSIS — N2581 Secondary hyperparathyroidism of renal origin: Secondary | ICD-10-CM | POA: Diagnosis not present

## 2017-04-15 DIAGNOSIS — N2581 Secondary hyperparathyroidism of renal origin: Secondary | ICD-10-CM | POA: Diagnosis not present

## 2017-04-15 DIAGNOSIS — D509 Iron deficiency anemia, unspecified: Secondary | ICD-10-CM | POA: Diagnosis not present

## 2017-04-15 DIAGNOSIS — N186 End stage renal disease: Secondary | ICD-10-CM | POA: Diagnosis not present

## 2017-04-15 DIAGNOSIS — E213 Hyperparathyroidism, unspecified: Secondary | ICD-10-CM | POA: Diagnosis not present

## 2017-04-17 DIAGNOSIS — E213 Hyperparathyroidism, unspecified: Secondary | ICD-10-CM | POA: Diagnosis not present

## 2017-04-17 DIAGNOSIS — D509 Iron deficiency anemia, unspecified: Secondary | ICD-10-CM | POA: Diagnosis not present

## 2017-04-17 DIAGNOSIS — N2581 Secondary hyperparathyroidism of renal origin: Secondary | ICD-10-CM | POA: Diagnosis not present

## 2017-04-17 DIAGNOSIS — N186 End stage renal disease: Secondary | ICD-10-CM | POA: Diagnosis not present

## 2017-04-19 DIAGNOSIS — N186 End stage renal disease: Secondary | ICD-10-CM | POA: Diagnosis not present

## 2017-04-19 DIAGNOSIS — E213 Hyperparathyroidism, unspecified: Secondary | ICD-10-CM | POA: Diagnosis not present

## 2017-04-19 DIAGNOSIS — N2581 Secondary hyperparathyroidism of renal origin: Secondary | ICD-10-CM | POA: Diagnosis not present

## 2017-04-19 DIAGNOSIS — D509 Iron deficiency anemia, unspecified: Secondary | ICD-10-CM | POA: Diagnosis not present

## 2017-04-22 DIAGNOSIS — N2581 Secondary hyperparathyroidism of renal origin: Secondary | ICD-10-CM | POA: Diagnosis not present

## 2017-04-22 DIAGNOSIS — E213 Hyperparathyroidism, unspecified: Secondary | ICD-10-CM | POA: Diagnosis not present

## 2017-04-22 DIAGNOSIS — D509 Iron deficiency anemia, unspecified: Secondary | ICD-10-CM | POA: Diagnosis not present

## 2017-04-22 DIAGNOSIS — N186 End stage renal disease: Secondary | ICD-10-CM | POA: Diagnosis not present

## 2017-04-24 DIAGNOSIS — E213 Hyperparathyroidism, unspecified: Secondary | ICD-10-CM | POA: Diagnosis not present

## 2017-04-24 DIAGNOSIS — N186 End stage renal disease: Secondary | ICD-10-CM | POA: Diagnosis not present

## 2017-04-24 DIAGNOSIS — D509 Iron deficiency anemia, unspecified: Secondary | ICD-10-CM | POA: Diagnosis not present

## 2017-04-24 DIAGNOSIS — N2581 Secondary hyperparathyroidism of renal origin: Secondary | ICD-10-CM | POA: Diagnosis not present

## 2017-04-26 DIAGNOSIS — N2581 Secondary hyperparathyroidism of renal origin: Secondary | ICD-10-CM | POA: Diagnosis not present

## 2017-04-26 DIAGNOSIS — D509 Iron deficiency anemia, unspecified: Secondary | ICD-10-CM | POA: Diagnosis not present

## 2017-04-26 DIAGNOSIS — N186 End stage renal disease: Secondary | ICD-10-CM | POA: Diagnosis not present

## 2017-04-26 DIAGNOSIS — E213 Hyperparathyroidism, unspecified: Secondary | ICD-10-CM | POA: Diagnosis not present

## 2017-04-29 DIAGNOSIS — N2581 Secondary hyperparathyroidism of renal origin: Secondary | ICD-10-CM | POA: Diagnosis not present

## 2017-04-29 DIAGNOSIS — E213 Hyperparathyroidism, unspecified: Secondary | ICD-10-CM | POA: Diagnosis not present

## 2017-04-29 DIAGNOSIS — D509 Iron deficiency anemia, unspecified: Secondary | ICD-10-CM | POA: Diagnosis not present

## 2017-04-29 DIAGNOSIS — N186 End stage renal disease: Secondary | ICD-10-CM | POA: Diagnosis not present

## 2017-05-01 DIAGNOSIS — N186 End stage renal disease: Secondary | ICD-10-CM | POA: Diagnosis not present

## 2017-05-01 DIAGNOSIS — D509 Iron deficiency anemia, unspecified: Secondary | ICD-10-CM | POA: Diagnosis not present

## 2017-05-01 DIAGNOSIS — E1129 Type 2 diabetes mellitus with other diabetic kidney complication: Secondary | ICD-10-CM | POA: Diagnosis not present

## 2017-05-01 DIAGNOSIS — E213 Hyperparathyroidism, unspecified: Secondary | ICD-10-CM | POA: Diagnosis not present

## 2017-05-01 DIAGNOSIS — N2581 Secondary hyperparathyroidism of renal origin: Secondary | ICD-10-CM | POA: Diagnosis not present

## 2017-05-03 DIAGNOSIS — N186 End stage renal disease: Secondary | ICD-10-CM | POA: Diagnosis not present

## 2017-05-03 DIAGNOSIS — D509 Iron deficiency anemia, unspecified: Secondary | ICD-10-CM | POA: Diagnosis not present

## 2017-05-03 DIAGNOSIS — E213 Hyperparathyroidism, unspecified: Secondary | ICD-10-CM | POA: Diagnosis not present

## 2017-05-03 DIAGNOSIS — N2581 Secondary hyperparathyroidism of renal origin: Secondary | ICD-10-CM | POA: Diagnosis not present

## 2017-05-06 DIAGNOSIS — N186 End stage renal disease: Secondary | ICD-10-CM | POA: Diagnosis not present

## 2017-05-06 DIAGNOSIS — N2581 Secondary hyperparathyroidism of renal origin: Secondary | ICD-10-CM | POA: Diagnosis not present

## 2017-05-06 DIAGNOSIS — E213 Hyperparathyroidism, unspecified: Secondary | ICD-10-CM | POA: Diagnosis not present

## 2017-05-06 DIAGNOSIS — D509 Iron deficiency anemia, unspecified: Secondary | ICD-10-CM | POA: Diagnosis not present

## 2017-05-07 DIAGNOSIS — Z992 Dependence on renal dialysis: Secondary | ICD-10-CM | POA: Diagnosis not present

## 2017-05-07 DIAGNOSIS — I120 Hypertensive chronic kidney disease with stage 5 chronic kidney disease or end stage renal disease: Secondary | ICD-10-CM | POA: Diagnosis not present

## 2017-05-07 DIAGNOSIS — N186 End stage renal disease: Secondary | ICD-10-CM | POA: Diagnosis not present

## 2017-05-08 DIAGNOSIS — N2581 Secondary hyperparathyroidism of renal origin: Secondary | ICD-10-CM | POA: Diagnosis not present

## 2017-05-08 DIAGNOSIS — N186 End stage renal disease: Secondary | ICD-10-CM | POA: Diagnosis not present

## 2017-05-08 DIAGNOSIS — E213 Hyperparathyroidism, unspecified: Secondary | ICD-10-CM | POA: Diagnosis not present

## 2017-05-08 DIAGNOSIS — D509 Iron deficiency anemia, unspecified: Secondary | ICD-10-CM | POA: Diagnosis not present

## 2017-05-10 DIAGNOSIS — D509 Iron deficiency anemia, unspecified: Secondary | ICD-10-CM | POA: Diagnosis not present

## 2017-05-10 DIAGNOSIS — N2581 Secondary hyperparathyroidism of renal origin: Secondary | ICD-10-CM | POA: Diagnosis not present

## 2017-05-10 DIAGNOSIS — E213 Hyperparathyroidism, unspecified: Secondary | ICD-10-CM | POA: Diagnosis not present

## 2017-05-10 DIAGNOSIS — N186 End stage renal disease: Secondary | ICD-10-CM | POA: Diagnosis not present

## 2017-05-13 DIAGNOSIS — E213 Hyperparathyroidism, unspecified: Secondary | ICD-10-CM | POA: Diagnosis not present

## 2017-05-13 DIAGNOSIS — D509 Iron deficiency anemia, unspecified: Secondary | ICD-10-CM | POA: Diagnosis not present

## 2017-05-13 DIAGNOSIS — N2581 Secondary hyperparathyroidism of renal origin: Secondary | ICD-10-CM | POA: Diagnosis not present

## 2017-05-13 DIAGNOSIS — N186 End stage renal disease: Secondary | ICD-10-CM | POA: Diagnosis not present

## 2017-05-15 DIAGNOSIS — N186 End stage renal disease: Secondary | ICD-10-CM | POA: Diagnosis not present

## 2017-05-15 DIAGNOSIS — N2581 Secondary hyperparathyroidism of renal origin: Secondary | ICD-10-CM | POA: Diagnosis not present

## 2017-05-15 DIAGNOSIS — E213 Hyperparathyroidism, unspecified: Secondary | ICD-10-CM | POA: Diagnosis not present

## 2017-05-15 DIAGNOSIS — D509 Iron deficiency anemia, unspecified: Secondary | ICD-10-CM | POA: Diagnosis not present

## 2017-05-17 DIAGNOSIS — N2581 Secondary hyperparathyroidism of renal origin: Secondary | ICD-10-CM | POA: Diagnosis not present

## 2017-05-17 DIAGNOSIS — D509 Iron deficiency anemia, unspecified: Secondary | ICD-10-CM | POA: Diagnosis not present

## 2017-05-17 DIAGNOSIS — N186 End stage renal disease: Secondary | ICD-10-CM | POA: Diagnosis not present

## 2017-05-17 DIAGNOSIS — E213 Hyperparathyroidism, unspecified: Secondary | ICD-10-CM | POA: Diagnosis not present

## 2017-05-20 DIAGNOSIS — D509 Iron deficiency anemia, unspecified: Secondary | ICD-10-CM | POA: Diagnosis not present

## 2017-05-20 DIAGNOSIS — N186 End stage renal disease: Secondary | ICD-10-CM | POA: Diagnosis not present

## 2017-05-20 DIAGNOSIS — N2581 Secondary hyperparathyroidism of renal origin: Secondary | ICD-10-CM | POA: Diagnosis not present

## 2017-05-20 DIAGNOSIS — E213 Hyperparathyroidism, unspecified: Secondary | ICD-10-CM | POA: Diagnosis not present

## 2017-05-22 DIAGNOSIS — N186 End stage renal disease: Secondary | ICD-10-CM | POA: Diagnosis not present

## 2017-05-22 DIAGNOSIS — N2581 Secondary hyperparathyroidism of renal origin: Secondary | ICD-10-CM | POA: Diagnosis not present

## 2017-05-22 DIAGNOSIS — D509 Iron deficiency anemia, unspecified: Secondary | ICD-10-CM | POA: Diagnosis not present

## 2017-05-22 DIAGNOSIS — E213 Hyperparathyroidism, unspecified: Secondary | ICD-10-CM | POA: Diagnosis not present

## 2017-05-24 DIAGNOSIS — E213 Hyperparathyroidism, unspecified: Secondary | ICD-10-CM | POA: Diagnosis not present

## 2017-05-24 DIAGNOSIS — N186 End stage renal disease: Secondary | ICD-10-CM | POA: Diagnosis not present

## 2017-05-24 DIAGNOSIS — D509 Iron deficiency anemia, unspecified: Secondary | ICD-10-CM | POA: Diagnosis not present

## 2017-05-24 DIAGNOSIS — N2581 Secondary hyperparathyroidism of renal origin: Secondary | ICD-10-CM | POA: Diagnosis not present

## 2017-05-26 DIAGNOSIS — N186 End stage renal disease: Secondary | ICD-10-CM | POA: Diagnosis not present

## 2017-05-26 DIAGNOSIS — N2581 Secondary hyperparathyroidism of renal origin: Secondary | ICD-10-CM | POA: Diagnosis not present

## 2017-05-26 DIAGNOSIS — D509 Iron deficiency anemia, unspecified: Secondary | ICD-10-CM | POA: Diagnosis not present

## 2017-05-26 DIAGNOSIS — E213 Hyperparathyroidism, unspecified: Secondary | ICD-10-CM | POA: Diagnosis not present

## 2017-05-28 DIAGNOSIS — N2581 Secondary hyperparathyroidism of renal origin: Secondary | ICD-10-CM | POA: Diagnosis not present

## 2017-05-28 DIAGNOSIS — E213 Hyperparathyroidism, unspecified: Secondary | ICD-10-CM | POA: Diagnosis not present

## 2017-05-28 DIAGNOSIS — D509 Iron deficiency anemia, unspecified: Secondary | ICD-10-CM | POA: Diagnosis not present

## 2017-05-28 DIAGNOSIS — N186 End stage renal disease: Secondary | ICD-10-CM | POA: Diagnosis not present

## 2017-05-31 DIAGNOSIS — N186 End stage renal disease: Secondary | ICD-10-CM | POA: Diagnosis not present

## 2017-05-31 DIAGNOSIS — D509 Iron deficiency anemia, unspecified: Secondary | ICD-10-CM | POA: Diagnosis not present

## 2017-05-31 DIAGNOSIS — E213 Hyperparathyroidism, unspecified: Secondary | ICD-10-CM | POA: Diagnosis not present

## 2017-05-31 DIAGNOSIS — N2581 Secondary hyperparathyroidism of renal origin: Secondary | ICD-10-CM | POA: Diagnosis not present

## 2017-06-03 DIAGNOSIS — E213 Hyperparathyroidism, unspecified: Secondary | ICD-10-CM | POA: Diagnosis not present

## 2017-06-03 DIAGNOSIS — N186 End stage renal disease: Secondary | ICD-10-CM | POA: Diagnosis not present

## 2017-06-03 DIAGNOSIS — N2581 Secondary hyperparathyroidism of renal origin: Secondary | ICD-10-CM | POA: Diagnosis not present

## 2017-06-03 DIAGNOSIS — D509 Iron deficiency anemia, unspecified: Secondary | ICD-10-CM | POA: Diagnosis not present

## 2017-06-05 DIAGNOSIS — D509 Iron deficiency anemia, unspecified: Secondary | ICD-10-CM | POA: Diagnosis not present

## 2017-06-05 DIAGNOSIS — N2581 Secondary hyperparathyroidism of renal origin: Secondary | ICD-10-CM | POA: Diagnosis not present

## 2017-06-05 DIAGNOSIS — N186 End stage renal disease: Secondary | ICD-10-CM | POA: Diagnosis not present

## 2017-06-05 DIAGNOSIS — E213 Hyperparathyroidism, unspecified: Secondary | ICD-10-CM | POA: Diagnosis not present

## 2017-06-06 DIAGNOSIS — N186 End stage renal disease: Secondary | ICD-10-CM | POA: Diagnosis not present

## 2017-06-06 DIAGNOSIS — Z992 Dependence on renal dialysis: Secondary | ICD-10-CM | POA: Diagnosis not present

## 2017-06-06 DIAGNOSIS — I120 Hypertensive chronic kidney disease with stage 5 chronic kidney disease or end stage renal disease: Secondary | ICD-10-CM | POA: Diagnosis not present

## 2017-06-07 DIAGNOSIS — N186 End stage renal disease: Secondary | ICD-10-CM | POA: Diagnosis not present

## 2017-06-07 DIAGNOSIS — N2581 Secondary hyperparathyroidism of renal origin: Secondary | ICD-10-CM | POA: Diagnosis not present

## 2017-06-07 DIAGNOSIS — D509 Iron deficiency anemia, unspecified: Secondary | ICD-10-CM | POA: Diagnosis not present

## 2017-06-07 DIAGNOSIS — E213 Hyperparathyroidism, unspecified: Secondary | ICD-10-CM | POA: Diagnosis not present

## 2017-06-10 DIAGNOSIS — N2581 Secondary hyperparathyroidism of renal origin: Secondary | ICD-10-CM | POA: Diagnosis not present

## 2017-06-10 DIAGNOSIS — D509 Iron deficiency anemia, unspecified: Secondary | ICD-10-CM | POA: Diagnosis not present

## 2017-06-10 DIAGNOSIS — N186 End stage renal disease: Secondary | ICD-10-CM | POA: Diagnosis not present

## 2017-06-10 DIAGNOSIS — E213 Hyperparathyroidism, unspecified: Secondary | ICD-10-CM | POA: Diagnosis not present

## 2017-06-12 DIAGNOSIS — N2581 Secondary hyperparathyroidism of renal origin: Secondary | ICD-10-CM | POA: Diagnosis not present

## 2017-06-12 DIAGNOSIS — D509 Iron deficiency anemia, unspecified: Secondary | ICD-10-CM | POA: Diagnosis not present

## 2017-06-12 DIAGNOSIS — N186 End stage renal disease: Secondary | ICD-10-CM | POA: Diagnosis not present

## 2017-06-12 DIAGNOSIS — E213 Hyperparathyroidism, unspecified: Secondary | ICD-10-CM | POA: Diagnosis not present

## 2017-06-14 DIAGNOSIS — D509 Iron deficiency anemia, unspecified: Secondary | ICD-10-CM | POA: Diagnosis not present

## 2017-06-14 DIAGNOSIS — N186 End stage renal disease: Secondary | ICD-10-CM | POA: Diagnosis not present

## 2017-06-14 DIAGNOSIS — E213 Hyperparathyroidism, unspecified: Secondary | ICD-10-CM | POA: Diagnosis not present

## 2017-06-14 DIAGNOSIS — N2581 Secondary hyperparathyroidism of renal origin: Secondary | ICD-10-CM | POA: Diagnosis not present

## 2017-06-17 DIAGNOSIS — E213 Hyperparathyroidism, unspecified: Secondary | ICD-10-CM | POA: Diagnosis not present

## 2017-06-17 DIAGNOSIS — N2581 Secondary hyperparathyroidism of renal origin: Secondary | ICD-10-CM | POA: Diagnosis not present

## 2017-06-17 DIAGNOSIS — N186 End stage renal disease: Secondary | ICD-10-CM | POA: Diagnosis not present

## 2017-06-17 DIAGNOSIS — D509 Iron deficiency anemia, unspecified: Secondary | ICD-10-CM | POA: Diagnosis not present

## 2017-06-19 DIAGNOSIS — D509 Iron deficiency anemia, unspecified: Secondary | ICD-10-CM | POA: Diagnosis not present

## 2017-06-19 DIAGNOSIS — N2581 Secondary hyperparathyroidism of renal origin: Secondary | ICD-10-CM | POA: Diagnosis not present

## 2017-06-19 DIAGNOSIS — N186 End stage renal disease: Secondary | ICD-10-CM | POA: Diagnosis not present

## 2017-06-19 DIAGNOSIS — E213 Hyperparathyroidism, unspecified: Secondary | ICD-10-CM | POA: Diagnosis not present

## 2017-06-21 DIAGNOSIS — D509 Iron deficiency anemia, unspecified: Secondary | ICD-10-CM | POA: Diagnosis not present

## 2017-06-21 DIAGNOSIS — E213 Hyperparathyroidism, unspecified: Secondary | ICD-10-CM | POA: Diagnosis not present

## 2017-06-21 DIAGNOSIS — N2581 Secondary hyperparathyroidism of renal origin: Secondary | ICD-10-CM | POA: Diagnosis not present

## 2017-06-21 DIAGNOSIS — N186 End stage renal disease: Secondary | ICD-10-CM | POA: Diagnosis not present

## 2017-06-24 DIAGNOSIS — N186 End stage renal disease: Secondary | ICD-10-CM | POA: Diagnosis not present

## 2017-06-24 DIAGNOSIS — N2581 Secondary hyperparathyroidism of renal origin: Secondary | ICD-10-CM | POA: Diagnosis not present

## 2017-06-24 DIAGNOSIS — E213 Hyperparathyroidism, unspecified: Secondary | ICD-10-CM | POA: Diagnosis not present

## 2017-06-24 DIAGNOSIS — D509 Iron deficiency anemia, unspecified: Secondary | ICD-10-CM | POA: Diagnosis not present

## 2017-06-26 DIAGNOSIS — D509 Iron deficiency anemia, unspecified: Secondary | ICD-10-CM | POA: Diagnosis not present

## 2017-06-26 DIAGNOSIS — E213 Hyperparathyroidism, unspecified: Secondary | ICD-10-CM | POA: Diagnosis not present

## 2017-06-26 DIAGNOSIS — N2581 Secondary hyperparathyroidism of renal origin: Secondary | ICD-10-CM | POA: Diagnosis not present

## 2017-06-26 DIAGNOSIS — N186 End stage renal disease: Secondary | ICD-10-CM | POA: Diagnosis not present

## 2017-06-28 DIAGNOSIS — D509 Iron deficiency anemia, unspecified: Secondary | ICD-10-CM | POA: Diagnosis not present

## 2017-06-28 DIAGNOSIS — N2581 Secondary hyperparathyroidism of renal origin: Secondary | ICD-10-CM | POA: Diagnosis not present

## 2017-06-28 DIAGNOSIS — N186 End stage renal disease: Secondary | ICD-10-CM | POA: Diagnosis not present

## 2017-06-28 DIAGNOSIS — E213 Hyperparathyroidism, unspecified: Secondary | ICD-10-CM | POA: Diagnosis not present

## 2017-06-30 DIAGNOSIS — N186 End stage renal disease: Secondary | ICD-10-CM | POA: Diagnosis not present

## 2017-06-30 DIAGNOSIS — E213 Hyperparathyroidism, unspecified: Secondary | ICD-10-CM | POA: Diagnosis not present

## 2017-06-30 DIAGNOSIS — D509 Iron deficiency anemia, unspecified: Secondary | ICD-10-CM | POA: Diagnosis not present

## 2017-06-30 DIAGNOSIS — N2581 Secondary hyperparathyroidism of renal origin: Secondary | ICD-10-CM | POA: Diagnosis not present

## 2017-07-03 DIAGNOSIS — N186 End stage renal disease: Secondary | ICD-10-CM | POA: Diagnosis not present

## 2017-07-03 DIAGNOSIS — E213 Hyperparathyroidism, unspecified: Secondary | ICD-10-CM | POA: Diagnosis not present

## 2017-07-03 DIAGNOSIS — D509 Iron deficiency anemia, unspecified: Secondary | ICD-10-CM | POA: Diagnosis not present

## 2017-07-03 DIAGNOSIS — N2581 Secondary hyperparathyroidism of renal origin: Secondary | ICD-10-CM | POA: Diagnosis not present

## 2017-07-05 DIAGNOSIS — N2581 Secondary hyperparathyroidism of renal origin: Secondary | ICD-10-CM | POA: Diagnosis not present

## 2017-07-05 DIAGNOSIS — N186 End stage renal disease: Secondary | ICD-10-CM | POA: Diagnosis not present

## 2017-07-05 DIAGNOSIS — D509 Iron deficiency anemia, unspecified: Secondary | ICD-10-CM | POA: Diagnosis not present

## 2017-07-05 DIAGNOSIS — E213 Hyperparathyroidism, unspecified: Secondary | ICD-10-CM | POA: Diagnosis not present

## 2017-07-07 DIAGNOSIS — E213 Hyperparathyroidism, unspecified: Secondary | ICD-10-CM | POA: Diagnosis not present

## 2017-07-07 DIAGNOSIS — D509 Iron deficiency anemia, unspecified: Secondary | ICD-10-CM | POA: Diagnosis not present

## 2017-07-07 DIAGNOSIS — N2581 Secondary hyperparathyroidism of renal origin: Secondary | ICD-10-CM | POA: Diagnosis not present

## 2017-07-07 DIAGNOSIS — Z992 Dependence on renal dialysis: Secondary | ICD-10-CM | POA: Diagnosis not present

## 2017-07-07 DIAGNOSIS — N186 End stage renal disease: Secondary | ICD-10-CM | POA: Diagnosis not present

## 2017-07-07 DIAGNOSIS — I120 Hypertensive chronic kidney disease with stage 5 chronic kidney disease or end stage renal disease: Secondary | ICD-10-CM | POA: Diagnosis not present

## 2017-07-10 DIAGNOSIS — N186 End stage renal disease: Secondary | ICD-10-CM | POA: Diagnosis not present

## 2017-07-10 DIAGNOSIS — Z992 Dependence on renal dialysis: Secondary | ICD-10-CM | POA: Diagnosis not present

## 2017-07-10 DIAGNOSIS — D509 Iron deficiency anemia, unspecified: Secondary | ICD-10-CM | POA: Diagnosis not present

## 2017-07-10 DIAGNOSIS — N2581 Secondary hyperparathyroidism of renal origin: Secondary | ICD-10-CM | POA: Diagnosis not present

## 2017-07-12 DIAGNOSIS — N2581 Secondary hyperparathyroidism of renal origin: Secondary | ICD-10-CM | POA: Diagnosis not present

## 2017-07-12 DIAGNOSIS — N186 End stage renal disease: Secondary | ICD-10-CM | POA: Diagnosis not present

## 2017-07-12 DIAGNOSIS — Z992 Dependence on renal dialysis: Secondary | ICD-10-CM | POA: Diagnosis not present

## 2017-07-12 DIAGNOSIS — D509 Iron deficiency anemia, unspecified: Secondary | ICD-10-CM | POA: Diagnosis not present

## 2017-07-15 DIAGNOSIS — N186 End stage renal disease: Secondary | ICD-10-CM | POA: Diagnosis not present

## 2017-07-15 DIAGNOSIS — N2581 Secondary hyperparathyroidism of renal origin: Secondary | ICD-10-CM | POA: Diagnosis not present

## 2017-07-15 DIAGNOSIS — D509 Iron deficiency anemia, unspecified: Secondary | ICD-10-CM | POA: Diagnosis not present

## 2017-07-15 DIAGNOSIS — Z992 Dependence on renal dialysis: Secondary | ICD-10-CM | POA: Diagnosis not present

## 2017-07-17 DIAGNOSIS — N2581 Secondary hyperparathyroidism of renal origin: Secondary | ICD-10-CM | POA: Diagnosis not present

## 2017-07-17 DIAGNOSIS — N186 End stage renal disease: Secondary | ICD-10-CM | POA: Diagnosis not present

## 2017-07-17 DIAGNOSIS — D509 Iron deficiency anemia, unspecified: Secondary | ICD-10-CM | POA: Diagnosis not present

## 2017-07-17 DIAGNOSIS — Z992 Dependence on renal dialysis: Secondary | ICD-10-CM | POA: Diagnosis not present

## 2017-07-19 DIAGNOSIS — N2581 Secondary hyperparathyroidism of renal origin: Secondary | ICD-10-CM | POA: Diagnosis not present

## 2017-07-19 DIAGNOSIS — Z992 Dependence on renal dialysis: Secondary | ICD-10-CM | POA: Diagnosis not present

## 2017-07-19 DIAGNOSIS — D509 Iron deficiency anemia, unspecified: Secondary | ICD-10-CM | POA: Diagnosis not present

## 2017-07-19 DIAGNOSIS — N186 End stage renal disease: Secondary | ICD-10-CM | POA: Diagnosis not present

## 2017-07-22 DIAGNOSIS — N186 End stage renal disease: Secondary | ICD-10-CM | POA: Diagnosis not present

## 2017-07-22 DIAGNOSIS — Z992 Dependence on renal dialysis: Secondary | ICD-10-CM | POA: Diagnosis not present

## 2017-07-22 DIAGNOSIS — D509 Iron deficiency anemia, unspecified: Secondary | ICD-10-CM | POA: Diagnosis not present

## 2017-07-22 DIAGNOSIS — N2581 Secondary hyperparathyroidism of renal origin: Secondary | ICD-10-CM | POA: Diagnosis not present

## 2017-07-24 DIAGNOSIS — N186 End stage renal disease: Secondary | ICD-10-CM | POA: Diagnosis not present

## 2017-07-24 DIAGNOSIS — D509 Iron deficiency anemia, unspecified: Secondary | ICD-10-CM | POA: Diagnosis not present

## 2017-07-24 DIAGNOSIS — Z992 Dependence on renal dialysis: Secondary | ICD-10-CM | POA: Diagnosis not present

## 2017-07-24 DIAGNOSIS — N2581 Secondary hyperparathyroidism of renal origin: Secondary | ICD-10-CM | POA: Diagnosis not present

## 2017-07-26 DIAGNOSIS — N2581 Secondary hyperparathyroidism of renal origin: Secondary | ICD-10-CM | POA: Diagnosis not present

## 2017-07-26 DIAGNOSIS — D509 Iron deficiency anemia, unspecified: Secondary | ICD-10-CM | POA: Diagnosis not present

## 2017-07-26 DIAGNOSIS — Z992 Dependence on renal dialysis: Secondary | ICD-10-CM | POA: Diagnosis not present

## 2017-07-26 DIAGNOSIS — N186 End stage renal disease: Secondary | ICD-10-CM | POA: Diagnosis not present

## 2017-07-29 DIAGNOSIS — D509 Iron deficiency anemia, unspecified: Secondary | ICD-10-CM | POA: Diagnosis not present

## 2017-07-29 DIAGNOSIS — N186 End stage renal disease: Secondary | ICD-10-CM | POA: Diagnosis not present

## 2017-07-29 DIAGNOSIS — N2581 Secondary hyperparathyroidism of renal origin: Secondary | ICD-10-CM | POA: Diagnosis not present

## 2017-07-29 DIAGNOSIS — Z992 Dependence on renal dialysis: Secondary | ICD-10-CM | POA: Diagnosis not present

## 2017-07-31 DIAGNOSIS — Z992 Dependence on renal dialysis: Secondary | ICD-10-CM | POA: Diagnosis not present

## 2017-07-31 DIAGNOSIS — N186 End stage renal disease: Secondary | ICD-10-CM | POA: Diagnosis not present

## 2017-07-31 DIAGNOSIS — D509 Iron deficiency anemia, unspecified: Secondary | ICD-10-CM | POA: Diagnosis not present

## 2017-07-31 DIAGNOSIS — N2581 Secondary hyperparathyroidism of renal origin: Secondary | ICD-10-CM | POA: Diagnosis not present

## 2017-07-31 DIAGNOSIS — E1129 Type 2 diabetes mellitus with other diabetic kidney complication: Secondary | ICD-10-CM | POA: Diagnosis not present

## 2017-08-02 DIAGNOSIS — N2581 Secondary hyperparathyroidism of renal origin: Secondary | ICD-10-CM | POA: Diagnosis not present

## 2017-08-02 DIAGNOSIS — Z992 Dependence on renal dialysis: Secondary | ICD-10-CM | POA: Diagnosis not present

## 2017-08-02 DIAGNOSIS — D509 Iron deficiency anemia, unspecified: Secondary | ICD-10-CM | POA: Diagnosis not present

## 2017-08-02 DIAGNOSIS — N186 End stage renal disease: Secondary | ICD-10-CM | POA: Diagnosis not present

## 2017-08-05 DIAGNOSIS — D509 Iron deficiency anemia, unspecified: Secondary | ICD-10-CM | POA: Diagnosis not present

## 2017-08-05 DIAGNOSIS — Z992 Dependence on renal dialysis: Secondary | ICD-10-CM | POA: Diagnosis not present

## 2017-08-05 DIAGNOSIS — N186 End stage renal disease: Secondary | ICD-10-CM | POA: Diagnosis not present

## 2017-08-05 DIAGNOSIS — N2581 Secondary hyperparathyroidism of renal origin: Secondary | ICD-10-CM | POA: Diagnosis not present

## 2017-08-07 DIAGNOSIS — N2581 Secondary hyperparathyroidism of renal origin: Secondary | ICD-10-CM | POA: Diagnosis not present

## 2017-08-07 DIAGNOSIS — I120 Hypertensive chronic kidney disease with stage 5 chronic kidney disease or end stage renal disease: Secondary | ICD-10-CM | POA: Diagnosis not present

## 2017-08-07 DIAGNOSIS — D509 Iron deficiency anemia, unspecified: Secondary | ICD-10-CM | POA: Diagnosis not present

## 2017-08-07 DIAGNOSIS — Z992 Dependence on renal dialysis: Secondary | ICD-10-CM | POA: Diagnosis not present

## 2017-08-07 DIAGNOSIS — N186 End stage renal disease: Secondary | ICD-10-CM | POA: Diagnosis not present

## 2017-08-08 DIAGNOSIS — Z992 Dependence on renal dialysis: Secondary | ICD-10-CM | POA: Diagnosis not present

## 2017-08-08 DIAGNOSIS — N186 End stage renal disease: Secondary | ICD-10-CM | POA: Diagnosis not present

## 2017-08-08 DIAGNOSIS — I120 Hypertensive chronic kidney disease with stage 5 chronic kidney disease or end stage renal disease: Secondary | ICD-10-CM | POA: Diagnosis not present

## 2017-08-09 DIAGNOSIS — N186 End stage renal disease: Secondary | ICD-10-CM | POA: Diagnosis not present

## 2017-08-09 DIAGNOSIS — N2581 Secondary hyperparathyroidism of renal origin: Secondary | ICD-10-CM | POA: Diagnosis not present

## 2017-08-09 DIAGNOSIS — D509 Iron deficiency anemia, unspecified: Secondary | ICD-10-CM | POA: Diagnosis not present

## 2017-08-12 DIAGNOSIS — N186 End stage renal disease: Secondary | ICD-10-CM | POA: Diagnosis not present

## 2017-08-12 DIAGNOSIS — D509 Iron deficiency anemia, unspecified: Secondary | ICD-10-CM | POA: Diagnosis not present

## 2017-08-12 DIAGNOSIS — N2581 Secondary hyperparathyroidism of renal origin: Secondary | ICD-10-CM | POA: Diagnosis not present

## 2017-08-14 DIAGNOSIS — D509 Iron deficiency anemia, unspecified: Secondary | ICD-10-CM | POA: Diagnosis not present

## 2017-08-14 DIAGNOSIS — N2581 Secondary hyperparathyroidism of renal origin: Secondary | ICD-10-CM | POA: Diagnosis not present

## 2017-08-14 DIAGNOSIS — N186 End stage renal disease: Secondary | ICD-10-CM | POA: Diagnosis not present

## 2017-08-16 DIAGNOSIS — N186 End stage renal disease: Secondary | ICD-10-CM | POA: Diagnosis not present

## 2017-08-16 DIAGNOSIS — N2581 Secondary hyperparathyroidism of renal origin: Secondary | ICD-10-CM | POA: Diagnosis not present

## 2017-08-16 DIAGNOSIS — D509 Iron deficiency anemia, unspecified: Secondary | ICD-10-CM | POA: Diagnosis not present

## 2017-08-19 DIAGNOSIS — D509 Iron deficiency anemia, unspecified: Secondary | ICD-10-CM | POA: Diagnosis not present

## 2017-08-19 DIAGNOSIS — N2581 Secondary hyperparathyroidism of renal origin: Secondary | ICD-10-CM | POA: Diagnosis not present

## 2017-08-19 DIAGNOSIS — N186 End stage renal disease: Secondary | ICD-10-CM | POA: Diagnosis not present

## 2017-08-21 DIAGNOSIS — N186 End stage renal disease: Secondary | ICD-10-CM | POA: Diagnosis not present

## 2017-08-21 DIAGNOSIS — D509 Iron deficiency anemia, unspecified: Secondary | ICD-10-CM | POA: Diagnosis not present

## 2017-08-21 DIAGNOSIS — N2581 Secondary hyperparathyroidism of renal origin: Secondary | ICD-10-CM | POA: Diagnosis not present

## 2017-08-23 DIAGNOSIS — N2581 Secondary hyperparathyroidism of renal origin: Secondary | ICD-10-CM | POA: Diagnosis not present

## 2017-08-23 DIAGNOSIS — D509 Iron deficiency anemia, unspecified: Secondary | ICD-10-CM | POA: Diagnosis not present

## 2017-08-23 DIAGNOSIS — N186 End stage renal disease: Secondary | ICD-10-CM | POA: Diagnosis not present

## 2017-08-26 DIAGNOSIS — N186 End stage renal disease: Secondary | ICD-10-CM | POA: Diagnosis not present

## 2017-08-26 DIAGNOSIS — D509 Iron deficiency anemia, unspecified: Secondary | ICD-10-CM | POA: Diagnosis not present

## 2017-08-26 DIAGNOSIS — N2581 Secondary hyperparathyroidism of renal origin: Secondary | ICD-10-CM | POA: Diagnosis not present

## 2017-08-28 DIAGNOSIS — N186 End stage renal disease: Secondary | ICD-10-CM | POA: Diagnosis not present

## 2017-08-28 DIAGNOSIS — D509 Iron deficiency anemia, unspecified: Secondary | ICD-10-CM | POA: Diagnosis not present

## 2017-08-28 DIAGNOSIS — N2581 Secondary hyperparathyroidism of renal origin: Secondary | ICD-10-CM | POA: Diagnosis not present

## 2017-08-30 DIAGNOSIS — N186 End stage renal disease: Secondary | ICD-10-CM | POA: Diagnosis not present

## 2017-08-30 DIAGNOSIS — D509 Iron deficiency anemia, unspecified: Secondary | ICD-10-CM | POA: Diagnosis not present

## 2017-08-30 DIAGNOSIS — N2581 Secondary hyperparathyroidism of renal origin: Secondary | ICD-10-CM | POA: Diagnosis not present

## 2017-09-02 DIAGNOSIS — D509 Iron deficiency anemia, unspecified: Secondary | ICD-10-CM | POA: Diagnosis not present

## 2017-09-02 DIAGNOSIS — N2581 Secondary hyperparathyroidism of renal origin: Secondary | ICD-10-CM | POA: Diagnosis not present

## 2017-09-02 DIAGNOSIS — N186 End stage renal disease: Secondary | ICD-10-CM | POA: Diagnosis not present

## 2017-09-04 DIAGNOSIS — N186 End stage renal disease: Secondary | ICD-10-CM | POA: Diagnosis not present

## 2017-09-04 DIAGNOSIS — D509 Iron deficiency anemia, unspecified: Secondary | ICD-10-CM | POA: Diagnosis not present

## 2017-09-04 DIAGNOSIS — N2581 Secondary hyperparathyroidism of renal origin: Secondary | ICD-10-CM | POA: Diagnosis not present

## 2017-09-05 DIAGNOSIS — Z992 Dependence on renal dialysis: Secondary | ICD-10-CM | POA: Diagnosis not present

## 2017-09-05 DIAGNOSIS — N186 End stage renal disease: Secondary | ICD-10-CM | POA: Diagnosis not present

## 2017-09-05 DIAGNOSIS — I120 Hypertensive chronic kidney disease with stage 5 chronic kidney disease or end stage renal disease: Secondary | ICD-10-CM | POA: Diagnosis not present

## 2017-09-06 DIAGNOSIS — D509 Iron deficiency anemia, unspecified: Secondary | ICD-10-CM | POA: Diagnosis not present

## 2017-09-06 DIAGNOSIS — N186 End stage renal disease: Secondary | ICD-10-CM | POA: Diagnosis not present

## 2017-09-06 DIAGNOSIS — D631 Anemia in chronic kidney disease: Secondary | ICD-10-CM | POA: Diagnosis not present

## 2017-09-06 DIAGNOSIS — N2581 Secondary hyperparathyroidism of renal origin: Secondary | ICD-10-CM | POA: Diagnosis not present

## 2017-09-09 DIAGNOSIS — D509 Iron deficiency anemia, unspecified: Secondary | ICD-10-CM | POA: Diagnosis not present

## 2017-09-09 DIAGNOSIS — D631 Anemia in chronic kidney disease: Secondary | ICD-10-CM | POA: Diagnosis not present

## 2017-09-09 DIAGNOSIS — N2581 Secondary hyperparathyroidism of renal origin: Secondary | ICD-10-CM | POA: Diagnosis not present

## 2017-09-09 DIAGNOSIS — N186 End stage renal disease: Secondary | ICD-10-CM | POA: Diagnosis not present

## 2017-09-11 DIAGNOSIS — N2581 Secondary hyperparathyroidism of renal origin: Secondary | ICD-10-CM | POA: Diagnosis not present

## 2017-09-11 DIAGNOSIS — D631 Anemia in chronic kidney disease: Secondary | ICD-10-CM | POA: Diagnosis not present

## 2017-09-11 DIAGNOSIS — N186 End stage renal disease: Secondary | ICD-10-CM | POA: Diagnosis not present

## 2017-09-11 DIAGNOSIS — D509 Iron deficiency anemia, unspecified: Secondary | ICD-10-CM | POA: Diagnosis not present

## 2017-09-13 DIAGNOSIS — N2581 Secondary hyperparathyroidism of renal origin: Secondary | ICD-10-CM | POA: Diagnosis not present

## 2017-09-13 DIAGNOSIS — N186 End stage renal disease: Secondary | ICD-10-CM | POA: Diagnosis not present

## 2017-09-13 DIAGNOSIS — D509 Iron deficiency anemia, unspecified: Secondary | ICD-10-CM | POA: Diagnosis not present

## 2017-09-13 DIAGNOSIS — D631 Anemia in chronic kidney disease: Secondary | ICD-10-CM | POA: Diagnosis not present

## 2017-09-16 DIAGNOSIS — N2581 Secondary hyperparathyroidism of renal origin: Secondary | ICD-10-CM | POA: Diagnosis not present

## 2017-09-16 DIAGNOSIS — N186 End stage renal disease: Secondary | ICD-10-CM | POA: Diagnosis not present

## 2017-09-16 DIAGNOSIS — D631 Anemia in chronic kidney disease: Secondary | ICD-10-CM | POA: Diagnosis not present

## 2017-09-16 DIAGNOSIS — D509 Iron deficiency anemia, unspecified: Secondary | ICD-10-CM | POA: Diagnosis not present

## 2017-09-18 DIAGNOSIS — D631 Anemia in chronic kidney disease: Secondary | ICD-10-CM | POA: Diagnosis not present

## 2017-09-18 DIAGNOSIS — D509 Iron deficiency anemia, unspecified: Secondary | ICD-10-CM | POA: Diagnosis not present

## 2017-09-18 DIAGNOSIS — N186 End stage renal disease: Secondary | ICD-10-CM | POA: Diagnosis not present

## 2017-09-18 DIAGNOSIS — N2581 Secondary hyperparathyroidism of renal origin: Secondary | ICD-10-CM | POA: Diagnosis not present

## 2017-09-20 DIAGNOSIS — D509 Iron deficiency anemia, unspecified: Secondary | ICD-10-CM | POA: Diagnosis not present

## 2017-09-20 DIAGNOSIS — D631 Anemia in chronic kidney disease: Secondary | ICD-10-CM | POA: Diagnosis not present

## 2017-09-20 DIAGNOSIS — N2581 Secondary hyperparathyroidism of renal origin: Secondary | ICD-10-CM | POA: Diagnosis not present

## 2017-09-20 DIAGNOSIS — N186 End stage renal disease: Secondary | ICD-10-CM | POA: Diagnosis not present

## 2017-09-23 DIAGNOSIS — D631 Anemia in chronic kidney disease: Secondary | ICD-10-CM | POA: Diagnosis not present

## 2017-09-23 DIAGNOSIS — N2581 Secondary hyperparathyroidism of renal origin: Secondary | ICD-10-CM | POA: Diagnosis not present

## 2017-09-23 DIAGNOSIS — N186 End stage renal disease: Secondary | ICD-10-CM | POA: Diagnosis not present

## 2017-09-23 DIAGNOSIS — D509 Iron deficiency anemia, unspecified: Secondary | ICD-10-CM | POA: Diagnosis not present

## 2017-09-25 DIAGNOSIS — N2581 Secondary hyperparathyroidism of renal origin: Secondary | ICD-10-CM | POA: Diagnosis not present

## 2017-09-25 DIAGNOSIS — D631 Anemia in chronic kidney disease: Secondary | ICD-10-CM | POA: Diagnosis not present

## 2017-09-25 DIAGNOSIS — N186 End stage renal disease: Secondary | ICD-10-CM | POA: Diagnosis not present

## 2017-09-25 DIAGNOSIS — D509 Iron deficiency anemia, unspecified: Secondary | ICD-10-CM | POA: Diagnosis not present

## 2017-09-27 DIAGNOSIS — D631 Anemia in chronic kidney disease: Secondary | ICD-10-CM | POA: Diagnosis not present

## 2017-09-27 DIAGNOSIS — D509 Iron deficiency anemia, unspecified: Secondary | ICD-10-CM | POA: Diagnosis not present

## 2017-09-27 DIAGNOSIS — N2581 Secondary hyperparathyroidism of renal origin: Secondary | ICD-10-CM | POA: Diagnosis not present

## 2017-09-27 DIAGNOSIS — N186 End stage renal disease: Secondary | ICD-10-CM | POA: Diagnosis not present

## 2017-09-30 DIAGNOSIS — N186 End stage renal disease: Secondary | ICD-10-CM | POA: Diagnosis not present

## 2017-09-30 DIAGNOSIS — N2581 Secondary hyperparathyroidism of renal origin: Secondary | ICD-10-CM | POA: Diagnosis not present

## 2017-09-30 DIAGNOSIS — D631 Anemia in chronic kidney disease: Secondary | ICD-10-CM | POA: Diagnosis not present

## 2017-09-30 DIAGNOSIS — D509 Iron deficiency anemia, unspecified: Secondary | ICD-10-CM | POA: Diagnosis not present

## 2017-10-02 DIAGNOSIS — D509 Iron deficiency anemia, unspecified: Secondary | ICD-10-CM | POA: Diagnosis not present

## 2017-10-02 DIAGNOSIS — D631 Anemia in chronic kidney disease: Secondary | ICD-10-CM | POA: Diagnosis not present

## 2017-10-02 DIAGNOSIS — N2581 Secondary hyperparathyroidism of renal origin: Secondary | ICD-10-CM | POA: Diagnosis not present

## 2017-10-02 DIAGNOSIS — N186 End stage renal disease: Secondary | ICD-10-CM | POA: Diagnosis not present

## 2017-10-04 DIAGNOSIS — N2581 Secondary hyperparathyroidism of renal origin: Secondary | ICD-10-CM | POA: Diagnosis not present

## 2017-10-04 DIAGNOSIS — D509 Iron deficiency anemia, unspecified: Secondary | ICD-10-CM | POA: Diagnosis not present

## 2017-10-04 DIAGNOSIS — N186 End stage renal disease: Secondary | ICD-10-CM | POA: Diagnosis not present

## 2017-10-04 DIAGNOSIS — D631 Anemia in chronic kidney disease: Secondary | ICD-10-CM | POA: Diagnosis not present

## 2017-10-06 DIAGNOSIS — Z992 Dependence on renal dialysis: Secondary | ICD-10-CM | POA: Diagnosis not present

## 2017-10-06 DIAGNOSIS — I120 Hypertensive chronic kidney disease with stage 5 chronic kidney disease or end stage renal disease: Secondary | ICD-10-CM | POA: Diagnosis not present

## 2017-10-06 DIAGNOSIS — N186 End stage renal disease: Secondary | ICD-10-CM | POA: Diagnosis not present

## 2017-10-07 DIAGNOSIS — N186 End stage renal disease: Secondary | ICD-10-CM | POA: Diagnosis not present

## 2017-10-07 DIAGNOSIS — N2581 Secondary hyperparathyroidism of renal origin: Secondary | ICD-10-CM | POA: Diagnosis not present

## 2017-10-07 DIAGNOSIS — D509 Iron deficiency anemia, unspecified: Secondary | ICD-10-CM | POA: Diagnosis not present

## 2017-10-09 DIAGNOSIS — N186 End stage renal disease: Secondary | ICD-10-CM | POA: Diagnosis not present

## 2017-10-09 DIAGNOSIS — D509 Iron deficiency anemia, unspecified: Secondary | ICD-10-CM | POA: Diagnosis not present

## 2017-10-09 DIAGNOSIS — N2581 Secondary hyperparathyroidism of renal origin: Secondary | ICD-10-CM | POA: Diagnosis not present

## 2017-10-11 DIAGNOSIS — N186 End stage renal disease: Secondary | ICD-10-CM | POA: Diagnosis not present

## 2017-10-11 DIAGNOSIS — N2581 Secondary hyperparathyroidism of renal origin: Secondary | ICD-10-CM | POA: Diagnosis not present

## 2017-10-11 DIAGNOSIS — D509 Iron deficiency anemia, unspecified: Secondary | ICD-10-CM | POA: Diagnosis not present

## 2017-10-14 DIAGNOSIS — D509 Iron deficiency anemia, unspecified: Secondary | ICD-10-CM | POA: Diagnosis not present

## 2017-10-14 DIAGNOSIS — N2581 Secondary hyperparathyroidism of renal origin: Secondary | ICD-10-CM | POA: Diagnosis not present

## 2017-10-14 DIAGNOSIS — N186 End stage renal disease: Secondary | ICD-10-CM | POA: Diagnosis not present

## 2017-10-16 DIAGNOSIS — N2581 Secondary hyperparathyroidism of renal origin: Secondary | ICD-10-CM | POA: Diagnosis not present

## 2017-10-16 DIAGNOSIS — N186 End stage renal disease: Secondary | ICD-10-CM | POA: Diagnosis not present

## 2017-10-16 DIAGNOSIS — D509 Iron deficiency anemia, unspecified: Secondary | ICD-10-CM | POA: Diagnosis not present

## 2017-10-18 DIAGNOSIS — D509 Iron deficiency anemia, unspecified: Secondary | ICD-10-CM | POA: Diagnosis not present

## 2017-10-18 DIAGNOSIS — N2581 Secondary hyperparathyroidism of renal origin: Secondary | ICD-10-CM | POA: Diagnosis not present

## 2017-10-18 DIAGNOSIS — N186 End stage renal disease: Secondary | ICD-10-CM | POA: Diagnosis not present

## 2017-10-21 DIAGNOSIS — N186 End stage renal disease: Secondary | ICD-10-CM | POA: Diagnosis not present

## 2017-10-21 DIAGNOSIS — D509 Iron deficiency anemia, unspecified: Secondary | ICD-10-CM | POA: Diagnosis not present

## 2017-10-21 DIAGNOSIS — N2581 Secondary hyperparathyroidism of renal origin: Secondary | ICD-10-CM | POA: Diagnosis not present

## 2017-10-23 DIAGNOSIS — N186 End stage renal disease: Secondary | ICD-10-CM | POA: Diagnosis not present

## 2017-10-23 DIAGNOSIS — N2581 Secondary hyperparathyroidism of renal origin: Secondary | ICD-10-CM | POA: Diagnosis not present

## 2017-10-23 DIAGNOSIS — D509 Iron deficiency anemia, unspecified: Secondary | ICD-10-CM | POA: Diagnosis not present

## 2017-10-25 DIAGNOSIS — N2581 Secondary hyperparathyroidism of renal origin: Secondary | ICD-10-CM | POA: Diagnosis not present

## 2017-10-25 DIAGNOSIS — N186 End stage renal disease: Secondary | ICD-10-CM | POA: Diagnosis not present

## 2017-10-25 DIAGNOSIS — D509 Iron deficiency anemia, unspecified: Secondary | ICD-10-CM | POA: Diagnosis not present

## 2017-10-28 DIAGNOSIS — N186 End stage renal disease: Secondary | ICD-10-CM | POA: Diagnosis not present

## 2017-10-28 DIAGNOSIS — D509 Iron deficiency anemia, unspecified: Secondary | ICD-10-CM | POA: Diagnosis not present

## 2017-10-28 DIAGNOSIS — N2581 Secondary hyperparathyroidism of renal origin: Secondary | ICD-10-CM | POA: Diagnosis not present

## 2017-10-30 DIAGNOSIS — E1129 Type 2 diabetes mellitus with other diabetic kidney complication: Secondary | ICD-10-CM | POA: Diagnosis not present

## 2017-10-30 DIAGNOSIS — D509 Iron deficiency anemia, unspecified: Secondary | ICD-10-CM | POA: Diagnosis not present

## 2017-10-30 DIAGNOSIS — N186 End stage renal disease: Secondary | ICD-10-CM | POA: Diagnosis not present

## 2017-10-30 DIAGNOSIS — N2581 Secondary hyperparathyroidism of renal origin: Secondary | ICD-10-CM | POA: Diagnosis not present

## 2017-11-01 DIAGNOSIS — N2581 Secondary hyperparathyroidism of renal origin: Secondary | ICD-10-CM | POA: Diagnosis not present

## 2017-11-01 DIAGNOSIS — N186 End stage renal disease: Secondary | ICD-10-CM | POA: Diagnosis not present

## 2017-11-01 DIAGNOSIS — D509 Iron deficiency anemia, unspecified: Secondary | ICD-10-CM | POA: Diagnosis not present

## 2017-11-04 DIAGNOSIS — N186 End stage renal disease: Secondary | ICD-10-CM | POA: Diagnosis not present

## 2017-11-04 DIAGNOSIS — N2581 Secondary hyperparathyroidism of renal origin: Secondary | ICD-10-CM | POA: Diagnosis not present

## 2017-11-04 DIAGNOSIS — D509 Iron deficiency anemia, unspecified: Secondary | ICD-10-CM | POA: Diagnosis not present

## 2017-11-05 DIAGNOSIS — I120 Hypertensive chronic kidney disease with stage 5 chronic kidney disease or end stage renal disease: Secondary | ICD-10-CM | POA: Diagnosis not present

## 2017-11-05 DIAGNOSIS — Z992 Dependence on renal dialysis: Secondary | ICD-10-CM | POA: Diagnosis not present

## 2017-11-05 DIAGNOSIS — N186 End stage renal disease: Secondary | ICD-10-CM | POA: Diagnosis not present

## 2017-11-06 DIAGNOSIS — D509 Iron deficiency anemia, unspecified: Secondary | ICD-10-CM | POA: Diagnosis not present

## 2017-11-06 DIAGNOSIS — N186 End stage renal disease: Secondary | ICD-10-CM | POA: Diagnosis not present

## 2017-11-06 DIAGNOSIS — N2581 Secondary hyperparathyroidism of renal origin: Secondary | ICD-10-CM | POA: Diagnosis not present

## 2017-11-08 DIAGNOSIS — N186 End stage renal disease: Secondary | ICD-10-CM | POA: Diagnosis not present

## 2017-11-08 DIAGNOSIS — N2581 Secondary hyperparathyroidism of renal origin: Secondary | ICD-10-CM | POA: Diagnosis not present

## 2017-11-08 DIAGNOSIS — D509 Iron deficiency anemia, unspecified: Secondary | ICD-10-CM | POA: Diagnosis not present

## 2017-11-11 DIAGNOSIS — N2581 Secondary hyperparathyroidism of renal origin: Secondary | ICD-10-CM | POA: Diagnosis not present

## 2017-11-11 DIAGNOSIS — D509 Iron deficiency anemia, unspecified: Secondary | ICD-10-CM | POA: Diagnosis not present

## 2017-11-11 DIAGNOSIS — N186 End stage renal disease: Secondary | ICD-10-CM | POA: Diagnosis not present

## 2017-11-13 DIAGNOSIS — N186 End stage renal disease: Secondary | ICD-10-CM | POA: Diagnosis not present

## 2017-11-13 DIAGNOSIS — N2581 Secondary hyperparathyroidism of renal origin: Secondary | ICD-10-CM | POA: Diagnosis not present

## 2017-11-13 DIAGNOSIS — D509 Iron deficiency anemia, unspecified: Secondary | ICD-10-CM | POA: Diagnosis not present

## 2017-11-15 DIAGNOSIS — N2581 Secondary hyperparathyroidism of renal origin: Secondary | ICD-10-CM | POA: Diagnosis not present

## 2017-11-15 DIAGNOSIS — D509 Iron deficiency anemia, unspecified: Secondary | ICD-10-CM | POA: Diagnosis not present

## 2017-11-15 DIAGNOSIS — N186 End stage renal disease: Secondary | ICD-10-CM | POA: Diagnosis not present

## 2017-11-18 DIAGNOSIS — N2581 Secondary hyperparathyroidism of renal origin: Secondary | ICD-10-CM | POA: Diagnosis not present

## 2017-11-18 DIAGNOSIS — D509 Iron deficiency anemia, unspecified: Secondary | ICD-10-CM | POA: Diagnosis not present

## 2017-11-18 DIAGNOSIS — N186 End stage renal disease: Secondary | ICD-10-CM | POA: Diagnosis not present

## 2017-11-20 DIAGNOSIS — N186 End stage renal disease: Secondary | ICD-10-CM | POA: Diagnosis not present

## 2017-11-20 DIAGNOSIS — D509 Iron deficiency anemia, unspecified: Secondary | ICD-10-CM | POA: Diagnosis not present

## 2017-11-20 DIAGNOSIS — N2581 Secondary hyperparathyroidism of renal origin: Secondary | ICD-10-CM | POA: Diagnosis not present

## 2017-11-22 DIAGNOSIS — N186 End stage renal disease: Secondary | ICD-10-CM | POA: Diagnosis not present

## 2017-11-22 DIAGNOSIS — D509 Iron deficiency anemia, unspecified: Secondary | ICD-10-CM | POA: Diagnosis not present

## 2017-11-22 DIAGNOSIS — N2581 Secondary hyperparathyroidism of renal origin: Secondary | ICD-10-CM | POA: Diagnosis not present

## 2017-11-25 DIAGNOSIS — N2581 Secondary hyperparathyroidism of renal origin: Secondary | ICD-10-CM | POA: Diagnosis not present

## 2017-11-25 DIAGNOSIS — N186 End stage renal disease: Secondary | ICD-10-CM | POA: Diagnosis not present

## 2017-11-25 DIAGNOSIS — D509 Iron deficiency anemia, unspecified: Secondary | ICD-10-CM | POA: Diagnosis not present

## 2017-11-27 DIAGNOSIS — N186 End stage renal disease: Secondary | ICD-10-CM | POA: Diagnosis not present

## 2017-11-27 DIAGNOSIS — D509 Iron deficiency anemia, unspecified: Secondary | ICD-10-CM | POA: Diagnosis not present

## 2017-11-27 DIAGNOSIS — N2581 Secondary hyperparathyroidism of renal origin: Secondary | ICD-10-CM | POA: Diagnosis not present

## 2017-11-29 DIAGNOSIS — D509 Iron deficiency anemia, unspecified: Secondary | ICD-10-CM | POA: Diagnosis not present

## 2017-11-29 DIAGNOSIS — N186 End stage renal disease: Secondary | ICD-10-CM | POA: Diagnosis not present

## 2017-11-29 DIAGNOSIS — N2581 Secondary hyperparathyroidism of renal origin: Secondary | ICD-10-CM | POA: Diagnosis not present

## 2017-12-02 DIAGNOSIS — N2581 Secondary hyperparathyroidism of renal origin: Secondary | ICD-10-CM | POA: Diagnosis not present

## 2017-12-02 DIAGNOSIS — N186 End stage renal disease: Secondary | ICD-10-CM | POA: Diagnosis not present

## 2017-12-02 DIAGNOSIS — D509 Iron deficiency anemia, unspecified: Secondary | ICD-10-CM | POA: Diagnosis not present

## 2017-12-04 DIAGNOSIS — D509 Iron deficiency anemia, unspecified: Secondary | ICD-10-CM | POA: Diagnosis not present

## 2017-12-04 DIAGNOSIS — N2581 Secondary hyperparathyroidism of renal origin: Secondary | ICD-10-CM | POA: Diagnosis not present

## 2017-12-04 DIAGNOSIS — N186 End stage renal disease: Secondary | ICD-10-CM | POA: Diagnosis not present

## 2017-12-06 DIAGNOSIS — I120 Hypertensive chronic kidney disease with stage 5 chronic kidney disease or end stage renal disease: Secondary | ICD-10-CM | POA: Diagnosis not present

## 2017-12-06 DIAGNOSIS — N186 End stage renal disease: Secondary | ICD-10-CM | POA: Diagnosis not present

## 2017-12-06 DIAGNOSIS — D509 Iron deficiency anemia, unspecified: Secondary | ICD-10-CM | POA: Diagnosis not present

## 2017-12-06 DIAGNOSIS — N2581 Secondary hyperparathyroidism of renal origin: Secondary | ICD-10-CM | POA: Diagnosis not present

## 2017-12-06 DIAGNOSIS — Z992 Dependence on renal dialysis: Secondary | ICD-10-CM | POA: Diagnosis not present

## 2017-12-09 DIAGNOSIS — N186 End stage renal disease: Secondary | ICD-10-CM | POA: Diagnosis not present

## 2017-12-09 DIAGNOSIS — D509 Iron deficiency anemia, unspecified: Secondary | ICD-10-CM | POA: Diagnosis not present

## 2017-12-09 DIAGNOSIS — N2581 Secondary hyperparathyroidism of renal origin: Secondary | ICD-10-CM | POA: Diagnosis not present

## 2017-12-11 DIAGNOSIS — N186 End stage renal disease: Secondary | ICD-10-CM | POA: Diagnosis not present

## 2017-12-11 DIAGNOSIS — N2581 Secondary hyperparathyroidism of renal origin: Secondary | ICD-10-CM | POA: Diagnosis not present

## 2017-12-11 DIAGNOSIS — D509 Iron deficiency anemia, unspecified: Secondary | ICD-10-CM | POA: Diagnosis not present

## 2017-12-13 DIAGNOSIS — D509 Iron deficiency anemia, unspecified: Secondary | ICD-10-CM | POA: Diagnosis not present

## 2017-12-13 DIAGNOSIS — N186 End stage renal disease: Secondary | ICD-10-CM | POA: Diagnosis not present

## 2017-12-13 DIAGNOSIS — N2581 Secondary hyperparathyroidism of renal origin: Secondary | ICD-10-CM | POA: Diagnosis not present

## 2017-12-16 DIAGNOSIS — N186 End stage renal disease: Secondary | ICD-10-CM | POA: Diagnosis not present

## 2017-12-16 DIAGNOSIS — N2581 Secondary hyperparathyroidism of renal origin: Secondary | ICD-10-CM | POA: Diagnosis not present

## 2017-12-16 DIAGNOSIS — D509 Iron deficiency anemia, unspecified: Secondary | ICD-10-CM | POA: Diagnosis not present

## 2017-12-18 DIAGNOSIS — N186 End stage renal disease: Secondary | ICD-10-CM | POA: Diagnosis not present

## 2017-12-18 DIAGNOSIS — D509 Iron deficiency anemia, unspecified: Secondary | ICD-10-CM | POA: Diagnosis not present

## 2017-12-18 DIAGNOSIS — N2581 Secondary hyperparathyroidism of renal origin: Secondary | ICD-10-CM | POA: Diagnosis not present

## 2017-12-20 DIAGNOSIS — N2581 Secondary hyperparathyroidism of renal origin: Secondary | ICD-10-CM | POA: Diagnosis not present

## 2017-12-20 DIAGNOSIS — N186 End stage renal disease: Secondary | ICD-10-CM | POA: Diagnosis not present

## 2017-12-20 DIAGNOSIS — D509 Iron deficiency anemia, unspecified: Secondary | ICD-10-CM | POA: Diagnosis not present

## 2017-12-23 DIAGNOSIS — N186 End stage renal disease: Secondary | ICD-10-CM | POA: Diagnosis not present

## 2017-12-23 DIAGNOSIS — N2581 Secondary hyperparathyroidism of renal origin: Secondary | ICD-10-CM | POA: Diagnosis not present

## 2017-12-23 DIAGNOSIS — D509 Iron deficiency anemia, unspecified: Secondary | ICD-10-CM | POA: Diagnosis not present

## 2017-12-25 DIAGNOSIS — N2581 Secondary hyperparathyroidism of renal origin: Secondary | ICD-10-CM | POA: Diagnosis not present

## 2017-12-25 DIAGNOSIS — N186 End stage renal disease: Secondary | ICD-10-CM | POA: Diagnosis not present

## 2017-12-25 DIAGNOSIS — D509 Iron deficiency anemia, unspecified: Secondary | ICD-10-CM | POA: Diagnosis not present

## 2017-12-27 DIAGNOSIS — N2581 Secondary hyperparathyroidism of renal origin: Secondary | ICD-10-CM | POA: Diagnosis not present

## 2017-12-27 DIAGNOSIS — D509 Iron deficiency anemia, unspecified: Secondary | ICD-10-CM | POA: Diagnosis not present

## 2017-12-27 DIAGNOSIS — N186 End stage renal disease: Secondary | ICD-10-CM | POA: Diagnosis not present

## 2017-12-30 DIAGNOSIS — N186 End stage renal disease: Secondary | ICD-10-CM | POA: Diagnosis not present

## 2017-12-30 DIAGNOSIS — N2581 Secondary hyperparathyroidism of renal origin: Secondary | ICD-10-CM | POA: Diagnosis not present

## 2017-12-30 DIAGNOSIS — D509 Iron deficiency anemia, unspecified: Secondary | ICD-10-CM | POA: Diagnosis not present

## 2018-01-01 DIAGNOSIS — D509 Iron deficiency anemia, unspecified: Secondary | ICD-10-CM | POA: Diagnosis not present

## 2018-01-01 DIAGNOSIS — N186 End stage renal disease: Secondary | ICD-10-CM | POA: Diagnosis not present

## 2018-01-01 DIAGNOSIS — N2581 Secondary hyperparathyroidism of renal origin: Secondary | ICD-10-CM | POA: Diagnosis not present

## 2018-01-03 DIAGNOSIS — N2581 Secondary hyperparathyroidism of renal origin: Secondary | ICD-10-CM | POA: Diagnosis not present

## 2018-01-03 DIAGNOSIS — D509 Iron deficiency anemia, unspecified: Secondary | ICD-10-CM | POA: Diagnosis not present

## 2018-01-03 DIAGNOSIS — N186 End stage renal disease: Secondary | ICD-10-CM | POA: Diagnosis not present

## 2018-01-05 DIAGNOSIS — Z992 Dependence on renal dialysis: Secondary | ICD-10-CM | POA: Diagnosis not present

## 2018-01-05 DIAGNOSIS — N186 End stage renal disease: Secondary | ICD-10-CM | POA: Diagnosis not present

## 2018-01-05 DIAGNOSIS — I120 Hypertensive chronic kidney disease with stage 5 chronic kidney disease or end stage renal disease: Secondary | ICD-10-CM | POA: Diagnosis not present

## 2018-01-06 DIAGNOSIS — D509 Iron deficiency anemia, unspecified: Secondary | ICD-10-CM | POA: Diagnosis not present

## 2018-01-06 DIAGNOSIS — N186 End stage renal disease: Secondary | ICD-10-CM | POA: Diagnosis not present

## 2018-01-06 DIAGNOSIS — N2581 Secondary hyperparathyroidism of renal origin: Secondary | ICD-10-CM | POA: Diagnosis not present

## 2018-01-08 DIAGNOSIS — N2581 Secondary hyperparathyroidism of renal origin: Secondary | ICD-10-CM | POA: Diagnosis not present

## 2018-01-08 DIAGNOSIS — D509 Iron deficiency anemia, unspecified: Secondary | ICD-10-CM | POA: Diagnosis not present

## 2018-01-08 DIAGNOSIS — N186 End stage renal disease: Secondary | ICD-10-CM | POA: Diagnosis not present

## 2018-01-10 DIAGNOSIS — N186 End stage renal disease: Secondary | ICD-10-CM | POA: Diagnosis not present

## 2018-01-10 DIAGNOSIS — N2581 Secondary hyperparathyroidism of renal origin: Secondary | ICD-10-CM | POA: Diagnosis not present

## 2018-01-10 DIAGNOSIS — D509 Iron deficiency anemia, unspecified: Secondary | ICD-10-CM | POA: Diagnosis not present

## 2018-01-13 DIAGNOSIS — N186 End stage renal disease: Secondary | ICD-10-CM | POA: Diagnosis not present

## 2018-01-13 DIAGNOSIS — N2581 Secondary hyperparathyroidism of renal origin: Secondary | ICD-10-CM | POA: Diagnosis not present

## 2018-01-13 DIAGNOSIS — D509 Iron deficiency anemia, unspecified: Secondary | ICD-10-CM | POA: Diagnosis not present

## 2018-01-15 DIAGNOSIS — D509 Iron deficiency anemia, unspecified: Secondary | ICD-10-CM | POA: Diagnosis not present

## 2018-01-15 DIAGNOSIS — N186 End stage renal disease: Secondary | ICD-10-CM | POA: Diagnosis not present

## 2018-01-15 DIAGNOSIS — N2581 Secondary hyperparathyroidism of renal origin: Secondary | ICD-10-CM | POA: Diagnosis not present

## 2018-01-17 DIAGNOSIS — D509 Iron deficiency anemia, unspecified: Secondary | ICD-10-CM | POA: Diagnosis not present

## 2018-01-17 DIAGNOSIS — N2581 Secondary hyperparathyroidism of renal origin: Secondary | ICD-10-CM | POA: Diagnosis not present

## 2018-01-17 DIAGNOSIS — N186 End stage renal disease: Secondary | ICD-10-CM | POA: Diagnosis not present

## 2018-01-20 DIAGNOSIS — N186 End stage renal disease: Secondary | ICD-10-CM | POA: Diagnosis not present

## 2018-01-20 DIAGNOSIS — D509 Iron deficiency anemia, unspecified: Secondary | ICD-10-CM | POA: Diagnosis not present

## 2018-01-20 DIAGNOSIS — N2581 Secondary hyperparathyroidism of renal origin: Secondary | ICD-10-CM | POA: Diagnosis not present

## 2018-01-22 DIAGNOSIS — D509 Iron deficiency anemia, unspecified: Secondary | ICD-10-CM | POA: Diagnosis not present

## 2018-01-22 DIAGNOSIS — N186 End stage renal disease: Secondary | ICD-10-CM | POA: Diagnosis not present

## 2018-01-22 DIAGNOSIS — N2581 Secondary hyperparathyroidism of renal origin: Secondary | ICD-10-CM | POA: Diagnosis not present

## 2018-01-24 DIAGNOSIS — D509 Iron deficiency anemia, unspecified: Secondary | ICD-10-CM | POA: Diagnosis not present

## 2018-01-24 DIAGNOSIS — N186 End stage renal disease: Secondary | ICD-10-CM | POA: Diagnosis not present

## 2018-01-24 DIAGNOSIS — N2581 Secondary hyperparathyroidism of renal origin: Secondary | ICD-10-CM | POA: Diagnosis not present

## 2018-01-27 DIAGNOSIS — N186 End stage renal disease: Secondary | ICD-10-CM | POA: Diagnosis not present

## 2018-01-27 DIAGNOSIS — D509 Iron deficiency anemia, unspecified: Secondary | ICD-10-CM | POA: Diagnosis not present

## 2018-01-27 DIAGNOSIS — N2581 Secondary hyperparathyroidism of renal origin: Secondary | ICD-10-CM | POA: Diagnosis not present

## 2018-01-29 DIAGNOSIS — D509 Iron deficiency anemia, unspecified: Secondary | ICD-10-CM | POA: Diagnosis not present

## 2018-01-29 DIAGNOSIS — N186 End stage renal disease: Secondary | ICD-10-CM | POA: Diagnosis not present

## 2018-01-29 DIAGNOSIS — E1129 Type 2 diabetes mellitus with other diabetic kidney complication: Secondary | ICD-10-CM | POA: Diagnosis not present

## 2018-01-29 DIAGNOSIS — N2581 Secondary hyperparathyroidism of renal origin: Secondary | ICD-10-CM | POA: Diagnosis not present

## 2018-01-31 DIAGNOSIS — N186 End stage renal disease: Secondary | ICD-10-CM | POA: Diagnosis not present

## 2018-01-31 DIAGNOSIS — N2581 Secondary hyperparathyroidism of renal origin: Secondary | ICD-10-CM | POA: Diagnosis not present

## 2018-01-31 DIAGNOSIS — D509 Iron deficiency anemia, unspecified: Secondary | ICD-10-CM | POA: Diagnosis not present

## 2018-02-03 DIAGNOSIS — N186 End stage renal disease: Secondary | ICD-10-CM | POA: Diagnosis not present

## 2018-02-03 DIAGNOSIS — D509 Iron deficiency anemia, unspecified: Secondary | ICD-10-CM | POA: Diagnosis not present

## 2018-02-03 DIAGNOSIS — N2581 Secondary hyperparathyroidism of renal origin: Secondary | ICD-10-CM | POA: Diagnosis not present

## 2018-02-05 DIAGNOSIS — Z992 Dependence on renal dialysis: Secondary | ICD-10-CM | POA: Diagnosis not present

## 2018-02-05 DIAGNOSIS — D509 Iron deficiency anemia, unspecified: Secondary | ICD-10-CM | POA: Diagnosis not present

## 2018-02-05 DIAGNOSIS — N2581 Secondary hyperparathyroidism of renal origin: Secondary | ICD-10-CM | POA: Diagnosis not present

## 2018-02-05 DIAGNOSIS — I120 Hypertensive chronic kidney disease with stage 5 chronic kidney disease or end stage renal disease: Secondary | ICD-10-CM | POA: Diagnosis not present

## 2018-02-05 DIAGNOSIS — N186 End stage renal disease: Secondary | ICD-10-CM | POA: Diagnosis not present

## 2018-02-07 DIAGNOSIS — N2581 Secondary hyperparathyroidism of renal origin: Secondary | ICD-10-CM | POA: Diagnosis not present

## 2018-02-07 DIAGNOSIS — N186 End stage renal disease: Secondary | ICD-10-CM | POA: Diagnosis not present

## 2018-02-07 DIAGNOSIS — D509 Iron deficiency anemia, unspecified: Secondary | ICD-10-CM | POA: Diagnosis not present

## 2018-02-10 DIAGNOSIS — N186 End stage renal disease: Secondary | ICD-10-CM | POA: Diagnosis not present

## 2018-02-10 DIAGNOSIS — N2581 Secondary hyperparathyroidism of renal origin: Secondary | ICD-10-CM | POA: Diagnosis not present

## 2018-02-10 DIAGNOSIS — D509 Iron deficiency anemia, unspecified: Secondary | ICD-10-CM | POA: Diagnosis not present

## 2018-02-12 DIAGNOSIS — D509 Iron deficiency anemia, unspecified: Secondary | ICD-10-CM | POA: Diagnosis not present

## 2018-02-12 DIAGNOSIS — N2581 Secondary hyperparathyroidism of renal origin: Secondary | ICD-10-CM | POA: Diagnosis not present

## 2018-02-12 DIAGNOSIS — N186 End stage renal disease: Secondary | ICD-10-CM | POA: Diagnosis not present

## 2018-02-14 DIAGNOSIS — N186 End stage renal disease: Secondary | ICD-10-CM | POA: Diagnosis not present

## 2018-02-14 DIAGNOSIS — N2581 Secondary hyperparathyroidism of renal origin: Secondary | ICD-10-CM | POA: Diagnosis not present

## 2018-02-14 DIAGNOSIS — D509 Iron deficiency anemia, unspecified: Secondary | ICD-10-CM | POA: Diagnosis not present

## 2018-02-17 DIAGNOSIS — D509 Iron deficiency anemia, unspecified: Secondary | ICD-10-CM | POA: Diagnosis not present

## 2018-02-17 DIAGNOSIS — N2581 Secondary hyperparathyroidism of renal origin: Secondary | ICD-10-CM | POA: Diagnosis not present

## 2018-02-17 DIAGNOSIS — N186 End stage renal disease: Secondary | ICD-10-CM | POA: Diagnosis not present

## 2018-02-19 DIAGNOSIS — N2581 Secondary hyperparathyroidism of renal origin: Secondary | ICD-10-CM | POA: Diagnosis not present

## 2018-02-19 DIAGNOSIS — D509 Iron deficiency anemia, unspecified: Secondary | ICD-10-CM | POA: Diagnosis not present

## 2018-02-19 DIAGNOSIS — N186 End stage renal disease: Secondary | ICD-10-CM | POA: Diagnosis not present

## 2018-02-21 DIAGNOSIS — N186 End stage renal disease: Secondary | ICD-10-CM | POA: Diagnosis not present

## 2018-02-21 DIAGNOSIS — N2581 Secondary hyperparathyroidism of renal origin: Secondary | ICD-10-CM | POA: Diagnosis not present

## 2018-02-21 DIAGNOSIS — D509 Iron deficiency anemia, unspecified: Secondary | ICD-10-CM | POA: Diagnosis not present

## 2018-02-24 DIAGNOSIS — D509 Iron deficiency anemia, unspecified: Secondary | ICD-10-CM | POA: Diagnosis not present

## 2018-02-24 DIAGNOSIS — N186 End stage renal disease: Secondary | ICD-10-CM | POA: Diagnosis not present

## 2018-02-24 DIAGNOSIS — N2581 Secondary hyperparathyroidism of renal origin: Secondary | ICD-10-CM | POA: Diagnosis not present

## 2018-02-26 DIAGNOSIS — N2581 Secondary hyperparathyroidism of renal origin: Secondary | ICD-10-CM | POA: Diagnosis not present

## 2018-02-26 DIAGNOSIS — D509 Iron deficiency anemia, unspecified: Secondary | ICD-10-CM | POA: Diagnosis not present

## 2018-02-26 DIAGNOSIS — N186 End stage renal disease: Secondary | ICD-10-CM | POA: Diagnosis not present

## 2018-02-28 DIAGNOSIS — N2581 Secondary hyperparathyroidism of renal origin: Secondary | ICD-10-CM | POA: Diagnosis not present

## 2018-02-28 DIAGNOSIS — N186 End stage renal disease: Secondary | ICD-10-CM | POA: Diagnosis not present

## 2018-02-28 DIAGNOSIS — D509 Iron deficiency anemia, unspecified: Secondary | ICD-10-CM | POA: Diagnosis not present

## 2018-03-03 DIAGNOSIS — N2581 Secondary hyperparathyroidism of renal origin: Secondary | ICD-10-CM | POA: Diagnosis not present

## 2018-03-03 DIAGNOSIS — N186 End stage renal disease: Secondary | ICD-10-CM | POA: Diagnosis not present

## 2018-03-03 DIAGNOSIS — D509 Iron deficiency anemia, unspecified: Secondary | ICD-10-CM | POA: Diagnosis not present

## 2018-03-05 DIAGNOSIS — N2581 Secondary hyperparathyroidism of renal origin: Secondary | ICD-10-CM | POA: Diagnosis not present

## 2018-03-05 DIAGNOSIS — D509 Iron deficiency anemia, unspecified: Secondary | ICD-10-CM | POA: Diagnosis not present

## 2018-03-05 DIAGNOSIS — N186 End stage renal disease: Secondary | ICD-10-CM | POA: Diagnosis not present

## 2018-03-07 DIAGNOSIS — N2581 Secondary hyperparathyroidism of renal origin: Secondary | ICD-10-CM | POA: Diagnosis not present

## 2018-03-07 DIAGNOSIS — N186 End stage renal disease: Secondary | ICD-10-CM | POA: Diagnosis not present

## 2018-03-07 DIAGNOSIS — D509 Iron deficiency anemia, unspecified: Secondary | ICD-10-CM | POA: Diagnosis not present

## 2018-03-08 DIAGNOSIS — N186 End stage renal disease: Secondary | ICD-10-CM | POA: Diagnosis not present

## 2018-03-08 DIAGNOSIS — Z992 Dependence on renal dialysis: Secondary | ICD-10-CM | POA: Diagnosis not present

## 2018-03-08 DIAGNOSIS — I120 Hypertensive chronic kidney disease with stage 5 chronic kidney disease or end stage renal disease: Secondary | ICD-10-CM | POA: Diagnosis not present

## 2018-03-10 DIAGNOSIS — N186 End stage renal disease: Secondary | ICD-10-CM | POA: Diagnosis not present

## 2018-03-10 DIAGNOSIS — N2581 Secondary hyperparathyroidism of renal origin: Secondary | ICD-10-CM | POA: Diagnosis not present

## 2018-03-10 DIAGNOSIS — D509 Iron deficiency anemia, unspecified: Secondary | ICD-10-CM | POA: Diagnosis not present

## 2018-03-12 DIAGNOSIS — D509 Iron deficiency anemia, unspecified: Secondary | ICD-10-CM | POA: Diagnosis not present

## 2018-03-12 DIAGNOSIS — N186 End stage renal disease: Secondary | ICD-10-CM | POA: Diagnosis not present

## 2018-03-12 DIAGNOSIS — N2581 Secondary hyperparathyroidism of renal origin: Secondary | ICD-10-CM | POA: Diagnosis not present

## 2018-03-14 DIAGNOSIS — D509 Iron deficiency anemia, unspecified: Secondary | ICD-10-CM | POA: Diagnosis not present

## 2018-03-14 DIAGNOSIS — N2581 Secondary hyperparathyroidism of renal origin: Secondary | ICD-10-CM | POA: Diagnosis not present

## 2018-03-14 DIAGNOSIS — N186 End stage renal disease: Secondary | ICD-10-CM | POA: Diagnosis not present

## 2018-03-17 DIAGNOSIS — N186 End stage renal disease: Secondary | ICD-10-CM | POA: Diagnosis not present

## 2018-03-17 DIAGNOSIS — D509 Iron deficiency anemia, unspecified: Secondary | ICD-10-CM | POA: Diagnosis not present

## 2018-03-17 DIAGNOSIS — N2581 Secondary hyperparathyroidism of renal origin: Secondary | ICD-10-CM | POA: Diagnosis not present

## 2018-03-19 DIAGNOSIS — N186 End stage renal disease: Secondary | ICD-10-CM | POA: Diagnosis not present

## 2018-03-19 DIAGNOSIS — D509 Iron deficiency anemia, unspecified: Secondary | ICD-10-CM | POA: Diagnosis not present

## 2018-03-19 DIAGNOSIS — N2581 Secondary hyperparathyroidism of renal origin: Secondary | ICD-10-CM | POA: Diagnosis not present

## 2018-03-21 DIAGNOSIS — N186 End stage renal disease: Secondary | ICD-10-CM | POA: Diagnosis not present

## 2018-03-21 DIAGNOSIS — N2581 Secondary hyperparathyroidism of renal origin: Secondary | ICD-10-CM | POA: Diagnosis not present

## 2018-03-21 DIAGNOSIS — D509 Iron deficiency anemia, unspecified: Secondary | ICD-10-CM | POA: Diagnosis not present

## 2018-03-24 DIAGNOSIS — D509 Iron deficiency anemia, unspecified: Secondary | ICD-10-CM | POA: Diagnosis not present

## 2018-03-24 DIAGNOSIS — N186 End stage renal disease: Secondary | ICD-10-CM | POA: Diagnosis not present

## 2018-03-24 DIAGNOSIS — N2581 Secondary hyperparathyroidism of renal origin: Secondary | ICD-10-CM | POA: Diagnosis not present

## 2018-03-26 DIAGNOSIS — D509 Iron deficiency anemia, unspecified: Secondary | ICD-10-CM | POA: Diagnosis not present

## 2018-03-26 DIAGNOSIS — N2581 Secondary hyperparathyroidism of renal origin: Secondary | ICD-10-CM | POA: Diagnosis not present

## 2018-03-26 DIAGNOSIS — N186 End stage renal disease: Secondary | ICD-10-CM | POA: Diagnosis not present

## 2018-03-28 DIAGNOSIS — N186 End stage renal disease: Secondary | ICD-10-CM | POA: Diagnosis not present

## 2018-03-28 DIAGNOSIS — N2581 Secondary hyperparathyroidism of renal origin: Secondary | ICD-10-CM | POA: Diagnosis not present

## 2018-03-28 DIAGNOSIS — D509 Iron deficiency anemia, unspecified: Secondary | ICD-10-CM | POA: Diagnosis not present

## 2018-03-31 DIAGNOSIS — D509 Iron deficiency anemia, unspecified: Secondary | ICD-10-CM | POA: Diagnosis not present

## 2018-03-31 DIAGNOSIS — N186 End stage renal disease: Secondary | ICD-10-CM | POA: Diagnosis not present

## 2018-03-31 DIAGNOSIS — N2581 Secondary hyperparathyroidism of renal origin: Secondary | ICD-10-CM | POA: Diagnosis not present

## 2018-04-02 DIAGNOSIS — N2581 Secondary hyperparathyroidism of renal origin: Secondary | ICD-10-CM | POA: Diagnosis not present

## 2018-04-02 DIAGNOSIS — N186 End stage renal disease: Secondary | ICD-10-CM | POA: Diagnosis not present

## 2018-04-02 DIAGNOSIS — D509 Iron deficiency anemia, unspecified: Secondary | ICD-10-CM | POA: Diagnosis not present

## 2018-04-04 DIAGNOSIS — N2581 Secondary hyperparathyroidism of renal origin: Secondary | ICD-10-CM | POA: Diagnosis not present

## 2018-04-04 DIAGNOSIS — N186 End stage renal disease: Secondary | ICD-10-CM | POA: Diagnosis not present

## 2018-04-04 DIAGNOSIS — D509 Iron deficiency anemia, unspecified: Secondary | ICD-10-CM | POA: Diagnosis not present

## 2018-04-07 DIAGNOSIS — N2581 Secondary hyperparathyroidism of renal origin: Secondary | ICD-10-CM | POA: Diagnosis not present

## 2018-04-07 DIAGNOSIS — D509 Iron deficiency anemia, unspecified: Secondary | ICD-10-CM | POA: Diagnosis not present

## 2018-04-07 DIAGNOSIS — N186 End stage renal disease: Secondary | ICD-10-CM | POA: Diagnosis not present

## 2018-04-07 DIAGNOSIS — I120 Hypertensive chronic kidney disease with stage 5 chronic kidney disease or end stage renal disease: Secondary | ICD-10-CM | POA: Diagnosis not present

## 2018-04-07 DIAGNOSIS — Z992 Dependence on renal dialysis: Secondary | ICD-10-CM | POA: Diagnosis not present

## 2018-04-09 DIAGNOSIS — N2581 Secondary hyperparathyroidism of renal origin: Secondary | ICD-10-CM | POA: Diagnosis not present

## 2018-04-09 DIAGNOSIS — N186 End stage renal disease: Secondary | ICD-10-CM | POA: Diagnosis not present

## 2018-04-09 DIAGNOSIS — D509 Iron deficiency anemia, unspecified: Secondary | ICD-10-CM | POA: Diagnosis not present

## 2018-04-11 DIAGNOSIS — N186 End stage renal disease: Secondary | ICD-10-CM | POA: Diagnosis not present

## 2018-04-11 DIAGNOSIS — D509 Iron deficiency anemia, unspecified: Secondary | ICD-10-CM | POA: Diagnosis not present

## 2018-04-11 DIAGNOSIS — N2581 Secondary hyperparathyroidism of renal origin: Secondary | ICD-10-CM | POA: Diagnosis not present

## 2018-04-14 DIAGNOSIS — N2581 Secondary hyperparathyroidism of renal origin: Secondary | ICD-10-CM | POA: Diagnosis not present

## 2018-04-14 DIAGNOSIS — N186 End stage renal disease: Secondary | ICD-10-CM | POA: Diagnosis not present

## 2018-04-14 DIAGNOSIS — D509 Iron deficiency anemia, unspecified: Secondary | ICD-10-CM | POA: Diagnosis not present

## 2018-04-16 DIAGNOSIS — D509 Iron deficiency anemia, unspecified: Secondary | ICD-10-CM | POA: Diagnosis not present

## 2018-04-16 DIAGNOSIS — N2581 Secondary hyperparathyroidism of renal origin: Secondary | ICD-10-CM | POA: Diagnosis not present

## 2018-04-16 DIAGNOSIS — N186 End stage renal disease: Secondary | ICD-10-CM | POA: Diagnosis not present

## 2018-04-18 DIAGNOSIS — N186 End stage renal disease: Secondary | ICD-10-CM | POA: Diagnosis not present

## 2018-04-18 DIAGNOSIS — D509 Iron deficiency anemia, unspecified: Secondary | ICD-10-CM | POA: Diagnosis not present

## 2018-04-18 DIAGNOSIS — N2581 Secondary hyperparathyroidism of renal origin: Secondary | ICD-10-CM | POA: Diagnosis not present

## 2018-04-21 DIAGNOSIS — N186 End stage renal disease: Secondary | ICD-10-CM | POA: Diagnosis not present

## 2018-04-21 DIAGNOSIS — N2581 Secondary hyperparathyroidism of renal origin: Secondary | ICD-10-CM | POA: Diagnosis not present

## 2018-04-21 DIAGNOSIS — D509 Iron deficiency anemia, unspecified: Secondary | ICD-10-CM | POA: Diagnosis not present

## 2018-04-23 DIAGNOSIS — N186 End stage renal disease: Secondary | ICD-10-CM | POA: Diagnosis not present

## 2018-04-23 DIAGNOSIS — D509 Iron deficiency anemia, unspecified: Secondary | ICD-10-CM | POA: Diagnosis not present

## 2018-04-23 DIAGNOSIS — N2581 Secondary hyperparathyroidism of renal origin: Secondary | ICD-10-CM | POA: Diagnosis not present

## 2018-04-25 DIAGNOSIS — N186 End stage renal disease: Secondary | ICD-10-CM | POA: Diagnosis not present

## 2018-04-25 DIAGNOSIS — N2581 Secondary hyperparathyroidism of renal origin: Secondary | ICD-10-CM | POA: Diagnosis not present

## 2018-04-25 DIAGNOSIS — D509 Iron deficiency anemia, unspecified: Secondary | ICD-10-CM | POA: Diagnosis not present

## 2018-04-28 DIAGNOSIS — N2581 Secondary hyperparathyroidism of renal origin: Secondary | ICD-10-CM | POA: Diagnosis not present

## 2018-04-28 DIAGNOSIS — N186 End stage renal disease: Secondary | ICD-10-CM | POA: Diagnosis not present

## 2018-04-28 DIAGNOSIS — D509 Iron deficiency anemia, unspecified: Secondary | ICD-10-CM | POA: Diagnosis not present

## 2018-04-30 DIAGNOSIS — N2581 Secondary hyperparathyroidism of renal origin: Secondary | ICD-10-CM | POA: Diagnosis not present

## 2018-04-30 DIAGNOSIS — E1129 Type 2 diabetes mellitus with other diabetic kidney complication: Secondary | ICD-10-CM | POA: Diagnosis not present

## 2018-04-30 DIAGNOSIS — D509 Iron deficiency anemia, unspecified: Secondary | ICD-10-CM | POA: Diagnosis not present

## 2018-04-30 DIAGNOSIS — N186 End stage renal disease: Secondary | ICD-10-CM | POA: Diagnosis not present

## 2018-05-02 DIAGNOSIS — N2581 Secondary hyperparathyroidism of renal origin: Secondary | ICD-10-CM | POA: Diagnosis not present

## 2018-05-02 DIAGNOSIS — D509 Iron deficiency anemia, unspecified: Secondary | ICD-10-CM | POA: Diagnosis not present

## 2018-05-02 DIAGNOSIS — N186 End stage renal disease: Secondary | ICD-10-CM | POA: Diagnosis not present

## 2018-05-05 DIAGNOSIS — N2581 Secondary hyperparathyroidism of renal origin: Secondary | ICD-10-CM | POA: Diagnosis not present

## 2018-05-05 DIAGNOSIS — N186 End stage renal disease: Secondary | ICD-10-CM | POA: Diagnosis not present

## 2018-05-05 DIAGNOSIS — D509 Iron deficiency anemia, unspecified: Secondary | ICD-10-CM | POA: Diagnosis not present

## 2018-05-07 DIAGNOSIS — N2581 Secondary hyperparathyroidism of renal origin: Secondary | ICD-10-CM | POA: Diagnosis not present

## 2018-05-07 DIAGNOSIS — N186 End stage renal disease: Secondary | ICD-10-CM | POA: Diagnosis not present

## 2018-05-07 DIAGNOSIS — D509 Iron deficiency anemia, unspecified: Secondary | ICD-10-CM | POA: Diagnosis not present

## 2018-05-08 DIAGNOSIS — N186 End stage renal disease: Secondary | ICD-10-CM | POA: Diagnosis not present

## 2018-05-08 DIAGNOSIS — Z992 Dependence on renal dialysis: Secondary | ICD-10-CM | POA: Diagnosis not present

## 2018-05-08 DIAGNOSIS — I120 Hypertensive chronic kidney disease with stage 5 chronic kidney disease or end stage renal disease: Secondary | ICD-10-CM | POA: Diagnosis not present

## 2018-05-09 DIAGNOSIS — D509 Iron deficiency anemia, unspecified: Secondary | ICD-10-CM | POA: Diagnosis not present

## 2018-05-09 DIAGNOSIS — N186 End stage renal disease: Secondary | ICD-10-CM | POA: Diagnosis not present

## 2018-05-09 DIAGNOSIS — D631 Anemia in chronic kidney disease: Secondary | ICD-10-CM | POA: Diagnosis not present

## 2018-05-09 DIAGNOSIS — N2581 Secondary hyperparathyroidism of renal origin: Secondary | ICD-10-CM | POA: Diagnosis not present

## 2018-05-12 DIAGNOSIS — N186 End stage renal disease: Secondary | ICD-10-CM | POA: Diagnosis not present

## 2018-05-12 DIAGNOSIS — D631 Anemia in chronic kidney disease: Secondary | ICD-10-CM | POA: Diagnosis not present

## 2018-05-12 DIAGNOSIS — N2581 Secondary hyperparathyroidism of renal origin: Secondary | ICD-10-CM | POA: Diagnosis not present

## 2018-05-12 DIAGNOSIS — D509 Iron deficiency anemia, unspecified: Secondary | ICD-10-CM | POA: Diagnosis not present

## 2018-05-14 DIAGNOSIS — N2581 Secondary hyperparathyroidism of renal origin: Secondary | ICD-10-CM | POA: Diagnosis not present

## 2018-05-14 DIAGNOSIS — D509 Iron deficiency anemia, unspecified: Secondary | ICD-10-CM | POA: Diagnosis not present

## 2018-05-14 DIAGNOSIS — D631 Anemia in chronic kidney disease: Secondary | ICD-10-CM | POA: Diagnosis not present

## 2018-05-14 DIAGNOSIS — N186 End stage renal disease: Secondary | ICD-10-CM | POA: Diagnosis not present

## 2018-05-16 DIAGNOSIS — D631 Anemia in chronic kidney disease: Secondary | ICD-10-CM | POA: Diagnosis not present

## 2018-05-16 DIAGNOSIS — N186 End stage renal disease: Secondary | ICD-10-CM | POA: Diagnosis not present

## 2018-05-16 DIAGNOSIS — D509 Iron deficiency anemia, unspecified: Secondary | ICD-10-CM | POA: Diagnosis not present

## 2018-05-16 DIAGNOSIS — N2581 Secondary hyperparathyroidism of renal origin: Secondary | ICD-10-CM | POA: Diagnosis not present

## 2018-05-19 DIAGNOSIS — D631 Anemia in chronic kidney disease: Secondary | ICD-10-CM | POA: Diagnosis not present

## 2018-05-19 DIAGNOSIS — N2581 Secondary hyperparathyroidism of renal origin: Secondary | ICD-10-CM | POA: Diagnosis not present

## 2018-05-19 DIAGNOSIS — N186 End stage renal disease: Secondary | ICD-10-CM | POA: Diagnosis not present

## 2018-05-19 DIAGNOSIS — D509 Iron deficiency anemia, unspecified: Secondary | ICD-10-CM | POA: Diagnosis not present

## 2018-05-21 DIAGNOSIS — D631 Anemia in chronic kidney disease: Secondary | ICD-10-CM | POA: Diagnosis not present

## 2018-05-21 DIAGNOSIS — D509 Iron deficiency anemia, unspecified: Secondary | ICD-10-CM | POA: Diagnosis not present

## 2018-05-21 DIAGNOSIS — N2581 Secondary hyperparathyroidism of renal origin: Secondary | ICD-10-CM | POA: Diagnosis not present

## 2018-05-21 DIAGNOSIS — N186 End stage renal disease: Secondary | ICD-10-CM | POA: Diagnosis not present

## 2018-05-23 DIAGNOSIS — D631 Anemia in chronic kidney disease: Secondary | ICD-10-CM | POA: Diagnosis not present

## 2018-05-23 DIAGNOSIS — D509 Iron deficiency anemia, unspecified: Secondary | ICD-10-CM | POA: Diagnosis not present

## 2018-05-23 DIAGNOSIS — N186 End stage renal disease: Secondary | ICD-10-CM | POA: Diagnosis not present

## 2018-05-23 DIAGNOSIS — N2581 Secondary hyperparathyroidism of renal origin: Secondary | ICD-10-CM | POA: Diagnosis not present

## 2018-05-26 DIAGNOSIS — N186 End stage renal disease: Secondary | ICD-10-CM | POA: Diagnosis not present

## 2018-05-26 DIAGNOSIS — D509 Iron deficiency anemia, unspecified: Secondary | ICD-10-CM | POA: Diagnosis not present

## 2018-05-26 DIAGNOSIS — D631 Anemia in chronic kidney disease: Secondary | ICD-10-CM | POA: Diagnosis not present

## 2018-05-26 DIAGNOSIS — N2581 Secondary hyperparathyroidism of renal origin: Secondary | ICD-10-CM | POA: Diagnosis not present

## 2018-05-28 DIAGNOSIS — N186 End stage renal disease: Secondary | ICD-10-CM | POA: Diagnosis not present

## 2018-05-28 DIAGNOSIS — N2581 Secondary hyperparathyroidism of renal origin: Secondary | ICD-10-CM | POA: Diagnosis not present

## 2018-05-28 DIAGNOSIS — D631 Anemia in chronic kidney disease: Secondary | ICD-10-CM | POA: Diagnosis not present

## 2018-05-28 DIAGNOSIS — D509 Iron deficiency anemia, unspecified: Secondary | ICD-10-CM | POA: Diagnosis not present

## 2018-05-30 DIAGNOSIS — N186 End stage renal disease: Secondary | ICD-10-CM | POA: Diagnosis not present

## 2018-05-30 DIAGNOSIS — D509 Iron deficiency anemia, unspecified: Secondary | ICD-10-CM | POA: Diagnosis not present

## 2018-05-30 DIAGNOSIS — N2581 Secondary hyperparathyroidism of renal origin: Secondary | ICD-10-CM | POA: Diagnosis not present

## 2018-05-30 DIAGNOSIS — D631 Anemia in chronic kidney disease: Secondary | ICD-10-CM | POA: Diagnosis not present

## 2018-06-01 DIAGNOSIS — D631 Anemia in chronic kidney disease: Secondary | ICD-10-CM | POA: Diagnosis not present

## 2018-06-01 DIAGNOSIS — D509 Iron deficiency anemia, unspecified: Secondary | ICD-10-CM | POA: Diagnosis not present

## 2018-06-01 DIAGNOSIS — N186 End stage renal disease: Secondary | ICD-10-CM | POA: Diagnosis not present

## 2018-06-01 DIAGNOSIS — N2581 Secondary hyperparathyroidism of renal origin: Secondary | ICD-10-CM | POA: Diagnosis not present

## 2018-06-03 DIAGNOSIS — D509 Iron deficiency anemia, unspecified: Secondary | ICD-10-CM | POA: Diagnosis not present

## 2018-06-03 DIAGNOSIS — N186 End stage renal disease: Secondary | ICD-10-CM | POA: Diagnosis not present

## 2018-06-03 DIAGNOSIS — N2581 Secondary hyperparathyroidism of renal origin: Secondary | ICD-10-CM | POA: Diagnosis not present

## 2018-06-03 DIAGNOSIS — D631 Anemia in chronic kidney disease: Secondary | ICD-10-CM | POA: Diagnosis not present

## 2018-06-06 DIAGNOSIS — N2581 Secondary hyperparathyroidism of renal origin: Secondary | ICD-10-CM | POA: Diagnosis not present

## 2018-06-06 DIAGNOSIS — D631 Anemia in chronic kidney disease: Secondary | ICD-10-CM | POA: Diagnosis not present

## 2018-06-06 DIAGNOSIS — D509 Iron deficiency anemia, unspecified: Secondary | ICD-10-CM | POA: Diagnosis not present

## 2018-06-06 DIAGNOSIS — N186 End stage renal disease: Secondary | ICD-10-CM | POA: Diagnosis not present

## 2018-06-07 DIAGNOSIS — Z992 Dependence on renal dialysis: Secondary | ICD-10-CM | POA: Diagnosis not present

## 2018-06-07 DIAGNOSIS — N186 End stage renal disease: Secondary | ICD-10-CM | POA: Diagnosis not present

## 2018-06-07 DIAGNOSIS — I120 Hypertensive chronic kidney disease with stage 5 chronic kidney disease or end stage renal disease: Secondary | ICD-10-CM | POA: Diagnosis not present

## 2018-06-09 DIAGNOSIS — D509 Iron deficiency anemia, unspecified: Secondary | ICD-10-CM | POA: Diagnosis not present

## 2018-06-09 DIAGNOSIS — N186 End stage renal disease: Secondary | ICD-10-CM | POA: Diagnosis not present

## 2018-06-09 DIAGNOSIS — N2581 Secondary hyperparathyroidism of renal origin: Secondary | ICD-10-CM | POA: Diagnosis not present

## 2018-06-11 DIAGNOSIS — N2581 Secondary hyperparathyroidism of renal origin: Secondary | ICD-10-CM | POA: Diagnosis not present

## 2018-06-11 DIAGNOSIS — N186 End stage renal disease: Secondary | ICD-10-CM | POA: Diagnosis not present

## 2018-06-11 DIAGNOSIS — D509 Iron deficiency anemia, unspecified: Secondary | ICD-10-CM | POA: Diagnosis not present

## 2018-06-13 DIAGNOSIS — N2581 Secondary hyperparathyroidism of renal origin: Secondary | ICD-10-CM | POA: Diagnosis not present

## 2018-06-13 DIAGNOSIS — N186 End stage renal disease: Secondary | ICD-10-CM | POA: Diagnosis not present

## 2018-06-13 DIAGNOSIS — D509 Iron deficiency anemia, unspecified: Secondary | ICD-10-CM | POA: Diagnosis not present

## 2018-06-16 DIAGNOSIS — N186 End stage renal disease: Secondary | ICD-10-CM | POA: Diagnosis not present

## 2018-06-16 DIAGNOSIS — N2581 Secondary hyperparathyroidism of renal origin: Secondary | ICD-10-CM | POA: Diagnosis not present

## 2018-06-16 DIAGNOSIS — D509 Iron deficiency anemia, unspecified: Secondary | ICD-10-CM | POA: Diagnosis not present

## 2018-06-18 DIAGNOSIS — N186 End stage renal disease: Secondary | ICD-10-CM | POA: Diagnosis not present

## 2018-06-18 DIAGNOSIS — D509 Iron deficiency anemia, unspecified: Secondary | ICD-10-CM | POA: Diagnosis not present

## 2018-06-18 DIAGNOSIS — N2581 Secondary hyperparathyroidism of renal origin: Secondary | ICD-10-CM | POA: Diagnosis not present

## 2018-06-20 DIAGNOSIS — N2581 Secondary hyperparathyroidism of renal origin: Secondary | ICD-10-CM | POA: Diagnosis not present

## 2018-06-20 DIAGNOSIS — N186 End stage renal disease: Secondary | ICD-10-CM | POA: Diagnosis not present

## 2018-06-20 DIAGNOSIS — D509 Iron deficiency anemia, unspecified: Secondary | ICD-10-CM | POA: Diagnosis not present

## 2018-06-23 DIAGNOSIS — N186 End stage renal disease: Secondary | ICD-10-CM | POA: Diagnosis not present

## 2018-06-23 DIAGNOSIS — N2581 Secondary hyperparathyroidism of renal origin: Secondary | ICD-10-CM | POA: Diagnosis not present

## 2018-06-23 DIAGNOSIS — D509 Iron deficiency anemia, unspecified: Secondary | ICD-10-CM | POA: Diagnosis not present

## 2018-06-25 DIAGNOSIS — N2581 Secondary hyperparathyroidism of renal origin: Secondary | ICD-10-CM | POA: Diagnosis not present

## 2018-06-25 DIAGNOSIS — N186 End stage renal disease: Secondary | ICD-10-CM | POA: Diagnosis not present

## 2018-06-25 DIAGNOSIS — D509 Iron deficiency anemia, unspecified: Secondary | ICD-10-CM | POA: Diagnosis not present

## 2018-06-27 DIAGNOSIS — N186 End stage renal disease: Secondary | ICD-10-CM | POA: Diagnosis not present

## 2018-06-27 DIAGNOSIS — D509 Iron deficiency anemia, unspecified: Secondary | ICD-10-CM | POA: Diagnosis not present

## 2018-06-27 DIAGNOSIS — N2581 Secondary hyperparathyroidism of renal origin: Secondary | ICD-10-CM | POA: Diagnosis not present

## 2018-06-29 DIAGNOSIS — N186 End stage renal disease: Secondary | ICD-10-CM | POA: Diagnosis not present

## 2018-06-29 DIAGNOSIS — N2581 Secondary hyperparathyroidism of renal origin: Secondary | ICD-10-CM | POA: Diagnosis not present

## 2018-06-29 DIAGNOSIS — D509 Iron deficiency anemia, unspecified: Secondary | ICD-10-CM | POA: Diagnosis not present

## 2018-07-02 DIAGNOSIS — N2581 Secondary hyperparathyroidism of renal origin: Secondary | ICD-10-CM | POA: Diagnosis not present

## 2018-07-02 DIAGNOSIS — N186 End stage renal disease: Secondary | ICD-10-CM | POA: Diagnosis not present

## 2018-07-02 DIAGNOSIS — D509 Iron deficiency anemia, unspecified: Secondary | ICD-10-CM | POA: Diagnosis not present

## 2018-07-04 DIAGNOSIS — N186 End stage renal disease: Secondary | ICD-10-CM | POA: Diagnosis not present

## 2018-07-04 DIAGNOSIS — D509 Iron deficiency anemia, unspecified: Secondary | ICD-10-CM | POA: Diagnosis not present

## 2018-07-04 DIAGNOSIS — N2581 Secondary hyperparathyroidism of renal origin: Secondary | ICD-10-CM | POA: Diagnosis not present

## 2018-07-06 DIAGNOSIS — N186 End stage renal disease: Secondary | ICD-10-CM | POA: Diagnosis not present

## 2018-07-06 DIAGNOSIS — N2581 Secondary hyperparathyroidism of renal origin: Secondary | ICD-10-CM | POA: Diagnosis not present

## 2018-07-06 DIAGNOSIS — D509 Iron deficiency anemia, unspecified: Secondary | ICD-10-CM | POA: Diagnosis not present

## 2019-05-19 ENCOUNTER — Other Ambulatory Visit: Payer: Self-pay

## 2019-05-19 ENCOUNTER — Ambulatory Visit (INDEPENDENT_AMBULATORY_CARE_PROVIDER_SITE_OTHER): Payer: Medicare Other | Admitting: Cardiology

## 2019-05-19 VITALS — BP 132/66 | HR 93 | Ht 71.0 in | Wt 273.4 lb

## 2019-05-19 DIAGNOSIS — I739 Peripheral vascular disease, unspecified: Secondary | ICD-10-CM

## 2019-05-19 DIAGNOSIS — Z79899 Other long term (current) drug therapy: Secondary | ICD-10-CM | POA: Diagnosis not present

## 2019-05-19 DIAGNOSIS — R0602 Shortness of breath: Secondary | ICD-10-CM | POA: Diagnosis not present

## 2019-05-19 DIAGNOSIS — I10 Essential (primary) hypertension: Secondary | ICD-10-CM

## 2019-05-19 DIAGNOSIS — R600 Localized edema: Secondary | ICD-10-CM

## 2019-05-19 LAB — COMPREHENSIVE METABOLIC PANEL
ALT: 8 IU/L (ref 0–44)
AST: 11 IU/L (ref 0–40)
Albumin/Globulin Ratio: 1 — ABNORMAL LOW (ref 1.2–2.2)
Albumin: 3.7 g/dL — ABNORMAL LOW (ref 3.8–4.9)
Alkaline Phosphatase: 71 IU/L (ref 39–117)
BUN/Creatinine Ratio: 2 — ABNORMAL LOW (ref 9–20)
BUN: 21 mg/dL (ref 6–24)
Bilirubin Total: 0.4 mg/dL (ref 0.0–1.2)
CO2: 27 mmol/L (ref 20–29)
Calcium: 8.8 mg/dL (ref 8.7–10.2)
Chloride: 93 mmol/L — ABNORMAL LOW (ref 96–106)
Creatinine, Ser: 10.69 mg/dL — ABNORMAL HIGH (ref 0.76–1.27)
GFR calc Af Amer: 6 mL/min/{1.73_m2} — ABNORMAL LOW (ref >59)
GFR calc non Af Amer: 5 mL/min/{1.73_m2} — ABNORMAL LOW (ref >59)
Globulin, Total: 3.6 g/dL (ref 1.5–4.5)
Glucose: 115 mg/dL — ABNORMAL HIGH (ref 65–99)
Potassium: 4.5 mmol/L (ref 3.5–5.2)
Sodium: 141 mmol/L (ref 134–144)
Total Protein: 7.3 g/dL (ref 6.0–8.5)

## 2019-05-19 LAB — LIPID PANEL
Chol/HDL Ratio: 4.2 ratio (ref 0.0–5.0)
Cholesterol, Total: 158 mg/dL (ref 100–199)
HDL: 38 mg/dL — ABNORMAL LOW (ref >39)
LDL Chol Calc (NIH): 104 mg/dL — ABNORMAL HIGH (ref 0–99)
Triglycerides: 81 mg/dL (ref 0–149)
VLDL Cholesterol Cal: 16 mg/dL (ref 5–40)

## 2019-05-19 LAB — BRAIN NATRIURETIC PEPTIDE: BNP: 197.8 pg/mL — ABNORMAL HIGH (ref 0.0–100.0)

## 2019-05-19 NOTE — Progress Notes (Signed)
Cardiology Office Note:    Date:  05/19/2019   ID:  Timothy Palmer, DOB May 28, 1966, MRN [id number]  PCP:  Corliss Parish, MD  Cardiologist:  No primary care provider on file.  Electrophysiologist:  None   Referring MD: Corliss Parish, MD   Chief Complaint  Patient presents with  . Shortness of Breath    History of Present Illness:    Timothy Palmer is a 53 y.o. 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 Moshe Cipro for an evaluation of this.  He was admitted to [facility] in September 2015 with shortness of breath and fever.  He was found to have mitral valve endocarditis with severe severe mitral regurgitation from a valve perforation.  He underwent minimally invasive MVR.  He also was found to have a mycotic aneurysm involving the SMA and a mycotic aneurysm involving the left popliteal.  He underwent a left popliteal embolectomy and then above to below-knee popliteal bypass.  He was admitted with recurrent MSSA bacteremia and endocarditis in October 2016.  He completed 6 weeks of IV vancomycin.  TEE in February 2017 showed normal EF (55-60%), 20mm x 80mm vegetation on anterior MV sewing ring, trivial MR.  He presents today with 3 to 4 months of dyspnea, lower extremity edema, fatigue.  He states that symptoms have been progressively worse.  He reports that he is short of breath with minimal exertion.  He denies any chest pain.  Also states that he has been getting burning in his legs with exertion.  He has been continuing Tuesday Thursday Saturday dialysis.     Past Medical History:  Diagnosis Date  . Acute combined systolic and diastolic congestive heart failure ([place]) 03/21/2014  . 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 Chronic diastolic congestive heart failure ([place])   . DVT of upper extremity (deep vein thrombosis) ([place]) 03/04/2014   Right arm  . ESRD (end stage renal disease) on dialysis ([place])    [place],Dialysis- T,Th,S  . Hypertension   . 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 The aneurysm measures 1.4 x 1.1 cm which is  significantly larger than the 4.5 mm parent vessel.   . Peripheral vascular disease ([place])   . Pneumonia 2014  . 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 Septic embolism to left lower extremity 03/25/2014  . Severe mitral regurgitation 03/23/2014   by TEE  . Shortness of breath dyspnea    with exertion  . 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 AV FISTULA PLACEMENT Left ?2005   forearm   . BASCILIC VEIN TRANSPOSITION Left 12/27/2015   Procedure: FIRST STAGE BASILIC VEIN TRANSPOSITION;  Surgeon: Conrad Sawmill, MD;  Location: [place];  Service:  Vascular;  Laterality: Left;  . BASCILIC VEIN TRANSPOSITION Left 02/12/2016   Procedure: SECOND STAGE BASILIC VEIN TRANSPOSITION;  Surgeon: Conrad Newburyport, MD;  Location: [place];  Service: Vascular;  Laterality: Left;  . EMBOLECTOMY Left 03/25/2014   Procedure: EMBOLECTOMY left popliteal;  Surgeon: Rosetta Posner, MD;  Location: [place];  Service: Vascular;  Laterality: Left;  . FEMORAL-POPLITEAL BYPASS GRAFT Left 03/25/2014    Procedure: Left Femoral- Below Knee Popliteal Bypass Graft;  Surgeon: Rosetta Posner, MD;  Location: [place];  Service: Vascular;  Laterality: Left;  . INTRAOPERATIVE TRANSESOPHAGEAL ECHOCARDIOGRAM N/A 04/05/2014   Procedure: INTRAOPERATIVE TRANSESOPHAGEAL ECHOCARDIOGRAM;  Surgeon: Rexene Alberts, MD;  Location: [place];  Service: Open Heart Surgery;  Laterality: N/A;  . LEFT AND RIGHT HEART CATHETERIZATION WITH CORONARY ANGIOGRAM N/A 03/28/2014   Procedure: LEFT AND RIGHT HEART CATHETERIZATION WITH CORONARY ANGIOGRAM;  Surgeon: Larey Dresser, MD;  Location[facility] LAB;  Service: Cardiovascular;  Laterality: N/A;  . MITRAL VALVE REPAIR Right 04/05/2014   Procedure: MINIMALLY INVASIVE MITRAL VALVE REPAIR (MVR);  Surgeon: Rexene Alberts, MD;  Location: [place];  Service: Open Heart Surgery;  Laterality: Right;  . MITRAL VALVE REPLACEMENT Right 04/05/2014   Procedure: Bring back MINIMALLY INVASIVE MITRAL VALVE (MV) REPLACEMENT - Reexploration for bleeding;  Surgeon: Rexene Alberts, MD;  Location: [place];  Service: Open Heart Surgery;  Laterality: Right;  . PARATHYROIDECTOMY N/A 03/22/2016   Procedure: PARATHYROIDECTOMY AUTOTRANSPLANT;  Surgeon: Armandina Gemma, MD;  Location: [place];  Service: General;  Laterality: N/A;  . PATCH ANGIOPLASTY Left 07/23/2013   Procedure: PATCH ANGIOPLASTY- LEFT RADIOCEPHALIC ARTERIOVENOUS FISTULA;  Surgeon: Angelia Mould, MD;  Location: [place];  Service: Vascular;  Laterality: Left;  . SHUNTOGRAM Left November 04, 2011  . TEE WITHOUT CARDIOVERSION N/A 03/24/2014   Procedure: TRANSESOPHAGEAL ECHOCARDIOGRAM (TEE);  Surgeon: Larey Dresser, MD;  Location: [place];  Service: Cardiovascular;  Laterality: N/A;  . TEE WITHOUT CARDIOVERSION N/A 04/12/2015   Procedure: TRANSESOPHAGEAL ECHOCARDIOGRAM (TEE);  Surgeon: Larey Dresser, MD;  Location: [place];  Service: Cardiovascular;  Laterality: N/A;  . TEE WITHOUT CARDIOVERSION N/A 08/18/2015   Procedure: TRANSESOPHAGEAL  ECHOCARDIOGRAM (TEE);  Surgeon: Larey Dresser, MD;  [facility] ENDOSCOPY;  Service: Cardiovascular;  Laterality: N/A;    Current Medications: Current Meds  Medication Sig  . aspirin 81 MG tablet Take 81 mg by mouth daily.  Marland Kitchen ibuprofen (ADVIL,MOTRIN) 200 MG tablet Take 400 mg by mouth every morning.   Marland Kitchen lisinopril (PRINIVIL,ZESTRIL) 20 MG tablet Take 20 mg by mouth daily.  . multivitamin (RENA-VIT) TABS tablet Take 1 tablet by mouth at bedtime. (Patient taking differently: Take 1 tablet by mouth daily. )  . Nutritional Supplements (FEEDING SUPPLEMENT, NEPRO CARB STEADY,) LIQD Take 237 mLs by mouth 2 (two) times daily between meals. (Patient taking differently: Take 237 mLs by mouth daily. )  . sevelamer carbonate (RENVELA) 800 MG tablet Take 4,000 mg by mouth 3 (three) times daily with meals.      Allergies:   No known allergies   Social History   Socioeconomic History  . Marital status: Single    Spouse name: Not on file  . Number of children: Not on file  . Years of education: Not on file  . Highest education level: Not on file  Occupational History  . Not on file  Social Needs  . Financial resource strain: Not on file  . Food insecurity    Worry: Not on  file    Inability: Not on file  . Transportation needs    Medical: Not on file    Non-medical: Not on file  Tobacco Use  . Smoking status: Never Smoker  . Smokeless tobacco: Never Used  Substance and Sexual Activity  . Alcohol use: Yes    Alcohol/week: 0.0 standard drinks    Comment: occasional  . Drug use: Yes    Frequency: 7.0 times per week    Types: Marijuana    Comment: last use 2.9.17 @ 1900  . Sexual activity: Never  Lifestyle  . Physical activity    Days per week: Not on file    Minutes per session: Not on file  . Stress: Not on file  Relationships  . Social Herbalist on phone: Not on file    Gets together: Not on file    Attends religious service: Not on file    Active member of club or  organization: Not on file    Attends meetings of clubs or organizations: Not on file    Relationship status: Not on file  Other Topics Concern  . Not on file  Social History Narrative  . Not on file     Family History: The patient's family history includes Diabetes in his mother; Hypertension in his brother, father, mother, and sister.  ROS:   Please see the history of present illness.     All other systems reviewed and are negative.  EKGs/Labs/Other Studies Reviewed:    The following studies were reviewed today:   EKG:  EKG is ordered today.  The ekg ordered today demonstrates normal sinus rhythm, rate 93, right axis deviation, nonspecific diffuse T wave flattening  Recent Labs: No results found for requested labs within last 8760 hours.  Recent Lipid Panel No results found for: CHOL, TRIG, HDL, CHOLHDL, VLDL, LDLCALC, LDLDIRECT  Physical Exam:    VS:  BP 132/66   Pulse 93   Ht 5\' 11"  (1.803 m)   Wt 273 lb 6.4 oz (124 kg)   BMI 38.13 kg/m     Wt Readings from Last 3 Encounters:  05/19/19 273 lb 6.4 oz (124 kg)  05/08/16 217 lb 9.6 oz (98.7 kg)  03/28/16 211 lb 10.3 oz (96 kg)     GEN:  in no acute distress HEENT: Normal NECK: No JVD LYMPHATICS: No lymphadenopathy CARDIAC: RRR, 3/6 systolic murmur loudest at RUSB RESPIRATORY:  Clear to auscultation without rales, wheezing or rhonchi  ABDOMEN: Soft, non-tender, non-distended MUSCULOSKELETAL: 1+ LE edema; No deformity  SKIN: Warm and dry NEUROLOGIC:  Alert and oriented x 3 PSYCHIATRIC:  Normal affect   ASSESSMENT:    1. SOB (shortness of breath)   2. Claudication ([place])   3. Medication management   4. Lower extremity edema   5. Essential hypertension    PLAN:    In order of problems listed above:  Dyspnea/LE edema: Given history of mitral valve endocarditis s/p MVR, will check TTE to evaluate for valvular dysfunction or heart failure.  Will check lower extremity duplex to evaluate for DVT.  Will check  CMET, BNP.  Claudication: will check ABIs  Hypertension: Takes lisinopril 20 mg on nondialysis days.  Appears controlled, will continue.  RTC in 1 month   Medication Adjustments/Labs and Tests Ordered: Current medicines are reviewed at length with the patient today.  Concerns regarding medicines are outlined above.  Orders Placed This Encounter  Procedures  . Brain natriuretic peptide  . Comprehensive metabolic  panel  . Lipid panel  . EKG 12-Lead  . ECHOCARDIOGRAM COMPLETE  . VAS [place] ABI WITH/WO TBI  . VAS [place] LOWER EXTREMITY VENOUS (DVT)   No orders of the defined types were placed in this encounter.   Patient Instructions  Medication Instructions:  Your Physician recommend you continue on your current medication as directed.    *If you need a refill on your cardiac medications before your next appointment, please call your pharmacy*  Lab Work: Your physician recommends that you return for lab work today ( Lipid, CMP, BNP)  If you have labs (blood work) drawn today and your tests are completely normal, you will receive your results only by: Marland Kitchen MyChart Message (if you have MyChart) OR . A paper copy in the mail If you have any lab test that is abnormal or we need to change your treatment, we will call you to review the results.  Testing/Procedures: Your physician has requested that you have an ankle brachial index (ABI). During this test an ultrasound and blood pressure cuff are used to evaluate the arteries that supply the arms and legs with blood. Allow thirty minutes for this exam. There are no restrictions or special instructions. [place]  Your physician has requested that you have an echocardiogram. Echocardiography is a painless test that uses sound waves to create images of your heart. It provides your doctor with information about the size and shape of your heart and how well your heart's chambers and valves are working. This procedure takes  approximately one hour. There are no restrictions for this procedure. [place] [address]    Follow-Up: At [facility], you and your health needs are our priority.  As part of our continuing mission to provide you with exceptional heart care, we have created designated Provider Care Teams.  These Care Teams include your primary Cardiologist (physician) and Advanced Practice Providers (APPs -  Physician Assistants and Nurse Practitioners) who all work together to provide you with the care you need, when you need it.  Your next appointment:   6 weeks  The format for your next appointment:   In Person  Provider:   Oswaldo Milian, MD       Signed, Donato Heinz, MD  05/19/2019 1:35 PM    [place]

## 2019-05-19 NOTE — Patient Instructions (Signed)
Medication Instructions:  Your Physician recommend you continue on your current medication as directed.    *If you need a refill on your cardiac medications before your next appointment, please call your pharmacy*  Lab Work: Your physician recommends that you return for lab work today ( Lipid, CMP, BNP)  If you have labs (blood work) drawn today and your tests are completely normal, you will receive your results only by: Marland Kitchen MyChart Message (if you have MyChart) OR . A paper copy in the mail If you have any lab test that is abnormal or we need to change your treatment, we will call you to review the results.  Testing/Procedures: Your physician has requested that you have an ankle brachial index (ABI). During this test an ultrasound and blood pressure cuff are used to evaluate the arteries that supply the arms and legs with blood. Allow thirty minutes for this exam. There are no restrictions or special instructions. [place]  Your physician has requested that you have an echocardiogram. Echocardiography is a painless test that uses sound waves to create images of your heart. It provides your doctor with information about the size and shape of your heart and how well your heart's chambers and valves are working. This procedure takes approximately one hour. There are no restrictions for this procedure. [place] [address]    Follow-Up: At [facility], you and your health needs are our priority.  As part of our continuing mission to provide you with exceptional heart care, we have created designated Provider Care Teams.  These Care Teams include your primary Cardiologist (physician) and Advanced Practice Providers (APPs -  Physician Assistants and Nurse Practitioners) who all work together to provide you with the care you need, when you need it.  Your next appointment:   6 weeks  The format for your next appointment:   In Person  Provider:   Oswaldo Milian, MD

## 2019-05-31 ENCOUNTER — Other Ambulatory Visit (HOSPITAL_COMMUNITY): Payer: Self-pay | Admitting: Cardiology

## 2019-05-31 ENCOUNTER — Other Ambulatory Visit (HOSPITAL_COMMUNITY): Payer: Medicare Other

## 2019-05-31 ENCOUNTER — Other Ambulatory Visit: Payer: Self-pay | Admitting: Cardiology

## 2019-05-31 DIAGNOSIS — T82392D Other mechanical complication of femoral arterial graft (bypass), subsequent encounter: Secondary | ICD-10-CM

## 2019-06-02 ENCOUNTER — Inpatient Hospital Stay (HOSPITAL_COMMUNITY): Admission: RE | Admit: 2019-06-02 | Payer: Medicare Other | Source: Ambulatory Visit

## 2019-06-02 ENCOUNTER — Encounter (HOSPITAL_COMMUNITY): Payer: Medicare Other

## 2019-06-07 ENCOUNTER — Ambulatory Visit (HOSPITAL_COMMUNITY)
Admission: RE | Admit: 2019-06-07 | Payer: Medicare Other | Source: Ambulatory Visit | Attending: Cardiology | Admitting: Cardiology

## 2019-06-09 ENCOUNTER — Other Ambulatory Visit (HOSPITAL_COMMUNITY): Payer: Medicare Other

## 2019-06-11 ENCOUNTER — Encounter (HOSPITAL_COMMUNITY): Payer: Self-pay | Admitting: Cardiology

## 2019-06-11 ENCOUNTER — Ambulatory Visit (HOSPITAL_COMMUNITY)
Admission: RE | Admit: 2019-06-11 | Payer: Medicare Other | Source: Ambulatory Visit | Attending: Cardiology | Admitting: Cardiology

## 2019-06-14 ENCOUNTER — Telehealth (HOSPITAL_COMMUNITY): Payer: Self-pay

## 2019-06-14 NOTE — Telephone Encounter (Signed)
New message    Just an FYI. We have made several attempts to contact this patient including sending a letter to schedule or reschedule their echocardiogram. We will be removing the patient from the echo [place].   12.7.20 @ 3:23pm lm on home vm - Nyomie Ehrlich   12.4.20 mail reminder letter Kyjuan Gause   12.2.20 no show  12.2.20 @ 2:15pm lm on home vm - Audris Speaker 11.19.20 Cancel Rsn: Patient (HD days )

## 2019-07-19 ENCOUNTER — Ambulatory Visit: Payer: Medicare Other | Admitting: Cardiology

## 2019-08-25 ENCOUNTER — Other Ambulatory Visit: Payer: Self-pay

## 2019-08-25 ENCOUNTER — Encounter: Payer: Self-pay | Admitting: Vascular Surgery

## 2019-08-25 ENCOUNTER — Ambulatory Visit (INDEPENDENT_AMBULATORY_CARE_PROVIDER_SITE_OTHER): Payer: Medicare Other | Admitting: Vascular Surgery

## 2019-08-25 VITALS — BP 140/93 | HR 78 | Temp 97.3°F | Resp 20 | Ht 71.0 in | Wt 261.7 lb

## 2019-08-25 DIAGNOSIS — N186 End stage renal disease: Secondary | ICD-10-CM

## 2019-08-25 DIAGNOSIS — Z992 Dependence on renal dialysis: Secondary | ICD-10-CM | POA: Diagnosis not present

## 2019-08-25 NOTE — Progress Notes (Signed)
REASON FOR CONSULT:    Nonhealing wound over left AV fistula.  The consult is requested by Dr. Posey Pronto.  ASSESSMENT & PLAN:   ESCHARS OVERLYING LEFT BASILIC VEIN TRANSPOSITION: This patient has 2 eschars overlying his left basilic vein transposition.  There are some aneurysms beneath this.  I think the only way to address this problem and still leave room for access would be to excise the more distal eschar first and not excised the entire aneurysm as this would not leave the room to access the fistula.  The more central eschar could be addressed later.  Alternatively we could address both and place a catheter.  However he has had a history of endocarditis and we are trying to avoid catheter if at all possible.  The patient is reluctant to consider surgery.  I explained that he could have life-threatening bleeding from this eschar however he feels quite strongly that he does not want to schedule surgery and would like to think about this further.  Hopefully he will call to schedule excision of the more distal eschar first.  He dialyzes on Tuesdays Thursdays and Saturdays and will do this on Monday Wednesday or Friday.  Deitra Mayo, MD Office: [phone]   HPI:   Timothy Palmer is a pleasant 54 y.o. male, who was referred with a scab overlying his left arm AV fistula.  The patient tells me that he had a fistula about 3 years ago.  I cannot find this op note.  Based on his exam it looks like it is a basilic vein transposition.  He dialyzes on Tuesdays Thursdays and Saturdays.  He said no recent uremic symptoms.  Specifically, he denies nausea, vomiting, fatigue, anorexia, or palpitations.  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  He had septic embolization.  Patient has a history of systolic and diastolic congestive heart failure.  Past Medical History:  Diagnosis Date  . Acute combined systolic and diastolic congestive heart failure  ([place]) 03/21/2014  . 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 Chronic diastolic congestive heart failure ([place])   . DVT of upper extremity (deep vein thrombosis) ([place]) 03/04/2014   Right arm  . ESRD (end stage renal disease) on dialysis ([place])    [place],Dialysis- T,Th,S  . Hypertension   . 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 The aneurysm measures 1.4 x 1.1 cm which is  significantly larger than the 4.5 mm parent vessel.   . Peripheral vascular disease ([place])   . Pneumonia 2014  . 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 Septic embolism to left lower extremity 03/25/2014  . Severe mitral regurgitation 03/23/2014   by TEE  . Shortness of breath dyspnea    with exertion  . 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 Diabetes Mother   . Hypertension Mother   . Hypertension Father   . Hypertension Sister   .  Hypertension Brother     SOCIAL HISTORY: Social History   Socioeconomic History  . Marital status: Single    Spouse name: Not on file  . Number of children: Not on file  . Years of education: Not on file  . Highest education level: Not on file  Occupational History  . Not on file  Tobacco Use  . Smoking status: Never Smoker  . Smokeless tobacco: Never Used  Substance and Sexual Activity  . Alcohol use: Yes    Alcohol/week: 0.0 standard drinks    Comment:  occasional  . Drug use: Yes    Frequency: 7.0 times per week    Types: Marijuana    Comment: last use 2.9.17 @ 1900  . Sexual activity: Never  Other Topics Concern  . Not on file  Social History Narrative  . Not on file   Social Determinants of Health   Financial Resource Strain:   . Difficulty of Paying Living Expenses: Not on file  Food Insecurity:   . Worried About Charity fundraiser in the Last Year: Not on file  . Ran Out of Food in the Last Year: Not on file  Transportation Needs:   . Lack of Transportation (Medical): Not on file  . Lack of Transportation (Non-Medical): Not on file  Physical Activity:   . Days of Exercise per Week: Not on file  . Minutes of Exercise per Session: Not on file  Stress:   . Feeling of Stress : Not on file  Social Connections:   . Frequency of Communication with Friends and Family: Not on file  . Frequency of Social Gatherings with Friends and Family: Not on file  . Attends Religious Services: Not on file  . Active Member of Clubs or Organizations: Not on file  . Attends Archivist Meetings: Not on file  . Marital Status: Not on file  Intimate Partner Violence:   . Fear of Current or Ex-Partner: Not on file  . Emotionally Abused: Not on file  . Physically Abused: Not on file  . Sexually Abused: Not on file    Allergies  Allergen Reactions  . No Known Allergies     Current Outpatient Medications  Medication Sig Dispense Refill  . aspirin 81 MG tablet Take 81 mg by mouth daily.    Marland Kitchen ibuprofen (ADVIL,MOTRIN) 200 MG tablet Take 400 mg by mouth every morning.     . iron sucrose in sodium chloride 0.9 % 100 mL Iron Sucrose (Venofer)    . lisinopril (PRINIVIL,ZESTRIL) 20 MG tablet Take 20 mg by mouth daily.    . montelukast (SINGULAIR) 10 MG tablet Take 10 mg by mouth daily.    . multivitamin (RENA-VIT) TABS tablet Take 1 tablet by mouth at bedtime. (Patient taking differently: Take 1 tablet by mouth daily. ) 30 tablet 0  .  Nutritional Supplements (FEEDING SUPPLEMENT, NEPRO CARB STEADY,) LIQD Take 237 mLs by mouth 2 (two) times daily between meals. (Patient taking differently: Take 237 mLs by mouth daily. )    . sevelamer carbonate (RENVELA) 800 MG tablet Take 4,000 mg by mouth 3 (three) times daily with meals.     . sucroferric oxyhydroxide (VELPHORO) 500 MG chewable tablet Chew by mouth.    . SYMBICORT 160-4.5 MCG/ACT inhaler SMARTSIG:2 Puff(s) By Mouth Morning-Night     No current facility-administered medications for this visit.    REVIEW OF SYSTEMS:  [X]  denotes positive finding, [ ]  denotes negative finding Cardiac  Comments:  Chest  pain or chest pressure:    Shortness of breath upon exertion: x   Short of breath when lying flat:    Irregular heart rhythm: x       Vascular    Pain in calf, thigh, or hip brought on by ambulation:    Pain in feet at night that wakes you up from your sleep:     Blood clot in your veins:    Leg swelling:  x       Pulmonary    Oxygen at home:    Productive cough:     Wheezing:         Neurologic    Sudden weakness in arms or legs:     Sudden numbness in arms or legs:     Sudden onset of difficulty speaking or slurred speech:    Temporary loss of vision in one eye:     Problems with dizziness:         Gastrointestinal    Blood in stool:     Vomited blood:         Genitourinary    Burning when urinating:     Blood in urine:        Psychiatric    Major depression:         Hematologic    Bleeding problems:    Problems with blood clotting too easily:        Skin    Rashes or ulcers:        Constitutional    Fever or chills:     PHYSICAL EXAM:   Vitals:   08/25/19 0855  BP: (!) 140/93  Pulse: 78  Resp: 20  Temp: (!) 97.3 F (36.3 C)  SpO2: 98%  Weight: 261 lb 11.2 oz (118.7 kg)  Height: 5\' 11"  (1.803 m)    GENERAL: The patient is a well-nourished male, in no acute distress. The vital signs are documented above. CARDIAC: There is a regular  rate and rhythm.  VASCULAR: I do not detect carotid bruits. I cannot palpate radial pulses. His left upper arm basilic vein transposition has 2 eschars overlying it as documented below.  The more significant eschar is the one that is distal.    PULMONARY: There is good air exchange bilaterally without wheezing or rales. ABDOMEN: Soft and non-tender with normal pitched bowel sounds.  MUSCULOSKELETAL: There are no major deformities or cyanosis. NEUROLOGIC: No focal weakness or paresthesias are detected. SKIN: There are no ulcers or rashes noted. PSYCHIATRIC: The patient has a normal affect.  DATA:    Patient has not had any recent labs in epic.

## 2019-09-16 ENCOUNTER — Encounter: Payer: Self-pay | Admitting: Cardiology

## 2019-11-01 ENCOUNTER — Other Ambulatory Visit: Payer: Self-pay

## 2019-11-01 ENCOUNTER — Emergency Department (HOSPITAL_COMMUNITY)
Admission: EM | Admit: 2019-11-01 | Discharge: 2019-11-01 | Disposition: A | Discharge status: Home / Self Care | Payer: Medicare Other | Attending: Emergency Medicine | Admitting: Emergency Medicine

## 2019-11-01 DIAGNOSIS — I132 Hypertensive heart and chronic kidney disease with heart failure and with stage 5 chronic kidney disease, or end stage renal disease: Secondary | ICD-10-CM | POA: Diagnosis not present

## 2019-11-01 DIAGNOSIS — Z7982 Long term (current) use of aspirin: Secondary | ICD-10-CM | POA: Insufficient documentation

## 2019-11-01 DIAGNOSIS — Y712 Prosthetic and other implants, materials and accessory cardiovascular devices associated with adverse incidents: Secondary | ICD-10-CM | POA: Insufficient documentation

## 2019-11-01 DIAGNOSIS — T82838A Hemorrhage of vascular prosthetic devices, implants and grafts, initial encounter: Secondary | ICD-10-CM | POA: Diagnosis present

## 2019-11-01 DIAGNOSIS — I5042 Chronic combined systolic (congestive) and diastolic (congestive) heart failure: Secondary | ICD-10-CM | POA: Diagnosis not present

## 2019-11-01 DIAGNOSIS — N186 End stage renal disease: Secondary | ICD-10-CM | POA: Insufficient documentation

## 2019-11-01 DIAGNOSIS — Z79899 Other long term (current) drug therapy: Secondary | ICD-10-CM | POA: Diagnosis not present

## 2019-11-01 NOTE — ED Triage Notes (Signed)
Pt states that his wound on he left arm is bleeding (had dialysis on Saturday, took a shower last night, states that scab came off and started bleeding). Bandage removed and no active bleeding noted, just dried blood. Compression bandage applied.

## 2019-11-01 NOTE — ED Provider Notes (Signed)
[place] EMERGENCY DEPARTMENT Provider Note   CSN: [id number] Arrival date & time: 11/01/19  0630     History Chief Complaint  Patient presents with  . Wound Check    Timothy Palmer is a 54 y.o. male.  Patient with history of end-stage renal disease on dialysis (T/Th/Sa) --presents to the emergency department today after he had a bleed from his left upper arm fistula site.  He states that he pulled a blanket over his arm while sleeping which caused a clot to break off.  He then had brisk bleeding that was squirting in nature.  He held pressure for about 45 minutes to an hour.  He states there was quite a lot of blood.  He came to the emergency department after symptoms not improved.  He has been in the emergency department for 3 hours without the bleeding resuming.  He does not feel lightheaded or dizzy and has not passed out.  No chest pain or shortness of breath.  Compression bandage applied on arrival.  Next dialysis session is tomorrow.  Onset of symptoms acute.  Course is now resolved.  Nothing make symptoms better or worse.        Past Medical History:  Diagnosis Date  . Acute combined systolic and diastolic congestive heart failure ([place]) 03/21/2014  . 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 Chronic diastolic congestive heart failure ([place])   . DVT of upper extremity (deep vein thrombosis) ([place]) 03/04/2014   Right arm  . ESRD (end stage renal disease) on dialysis ([place])    [place],Dialysis- T,Th,S  . Hypertension   . 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 The aneurysm measures 1.4 x 1.1 cm which is  significantly larger than the 4.5 mm parent vessel.   . Peripheral vascular disease ([place])   . Pneumonia 2014  . Prosthetic  valve endocarditis ([place]) 04/12/2015   Vegetation on atrial surface of repaired native mitral valve seen on TEE  . 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 Septic embolism to left lower extremity 03/25/2014  . Severe mitral regurgitation 03/23/2014   by TEE  . Shortness of breath dyspnea    with exertion  . 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 Secondary hyperparathyroidism of renal origin ([place]) 03/22/2016  . Hyperparathyroidism, secondary ([place]) 03/21/2016  . Lung nodules   . ESRD on dialysis ([place])   . Mediastinal adenopathy   . 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 Renal mass   . Cavitary lesion of lung 04/06/2015  . Mitral valve replaced 04/06/2015  . Anticoagulant long-term use 04/06/2015  . Anemia in chronic kidney disease 04/06/2015  . Coagulopathy ([place]) 04/06/2015  . Multiple pulmonary nodules   . Endocarditis 04/05/2015  . Weakness of left lower extremity 11/14/2014  . Encounter for therapeutic drug monitoring 06/16/2014  . Paroxysmal atrial fibrillation ([place]) 06/13/2014  . S/P minimally invasive mitral valve repair 04/05/2014  . Septic embolism to left lower extremity 03/25/2014  . 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 Splenic infarction  03/24/2014  . Chronic diastolic congestive heart failure ([place])   . Mitral valve vegetation 03/23/2014  . Severe mitral regurgitation 03/23/2014  . Hypertension 03/22/2014  . Acute bronchitis 03/22/2014  . T wave inversion in EKG 03/22/2014  . QT prolongation 03/22/2014  . Acute combined systolic and diastolic congestive heart failure ([place]) 03/21/2014  . Bacterial endocarditis - MSSA positive blood cultures with mitral valve  vegetation, severe mitral regurgitation, and septic embolization 03/21/2014  . Knee pain, left 03/21/2014  . DVT of upper extremity (deep vein thrombosis) ([place]) 03/04/2014  . Positive blood culture 11/27/2013  . End stage renal disease ([place]) 06/30/2013    Past Surgical History:  Procedure Laterality Date  . AV FISTULA PLACEMENT Left ?2005   forearm   . BASCILIC VEIN TRANSPOSITION Left 12/27/2015   Procedure: FIRST STAGE BASILIC VEIN TRANSPOSITION;  Surgeon: Conrad [place]ville, MD;  Location: [place];  Service: Vascular;  Laterality: Left;  . BASCILIC VEIN TRANSPOSITION Left 02/12/2016   Procedure: SECOND STAGE BASILIC VEIN TRANSPOSITION;  Surgeon: Conrad Verona, MD;  Location: [place];  Service: Vascular;  Laterality: Left;  . EMBOLECTOMY Left 03/25/2014   Procedure: EMBOLECTOMY left popliteal;  Surgeon: Rosetta Posner, MD;  Location: [place];  Service: Vascular;  Laterality: Left;  . FEMORAL-POPLITEAL BYPASS GRAFT Left 03/25/2014   Procedure: Left Femoral- Below Knee Popliteal Bypass Graft;  Surgeon: Rosetta Posner, MD;  Location: [place];  Service: Vascular;  Laterality: Left;  . INTRAOPERATIVE TRANSESOPHAGEAL ECHOCARDIOGRAM N/A 04/05/2014   Procedure: INTRAOPERATIVE TRANSESOPHAGEAL ECHOCARDIOGRAM;  Surgeon: Rexene Alberts, MD;  Location: [place];  Service: Open Heart Surgery;  Laterality: N/A;  . LEFT AND RIGHT HEART CATHETERIZATION WITH CORONARY ANGIOGRAM N/A 03/28/2014   Procedure: LEFT AND RIGHT HEART CATHETERIZATION WITH CORONARY ANGIOGRAM;  Surgeon: Larey Dresser, MD;  Location: [facility] CATH LAB;  Service: Cardiovascular;  Laterality: N/A;  . MITRAL VALVE REPAIR Right 04/05/2014   Procedure: MINIMALLY INVASIVE MITRAL VALVE REPAIR (MVR);  Surgeon: Rexene Alberts, MD;  Location: [place];  Service: Open Heart Surgery;  Laterality: Right;  . MITRAL VALVE REPLACEMENT Right 04/05/2014   Procedure: Bring back MINIMALLY INVASIVE MITRAL VALVE (MV) REPLACEMENT - Reexploration for bleeding;  Surgeon: Rexene Alberts, MD;   Location: [place];  Service: Open Heart Surgery;  Laterality: Right;  . PARATHYROIDECTOMY N/A 03/22/2016   Procedure: PARATHYROIDECTOMY AUTOTRANSPLANT;  Surgeon: Armandina Gemma, MD;  Location: [place];  Service: General;  Laterality: N/A;  . PATCH ANGIOPLASTY Left 07/23/2013   Procedure: PATCH ANGIOPLASTY- LEFT RADIOCEPHALIC ARTERIOVENOUS FISTULA;  Surgeon: Angelia Mould, MD;  Location: [place];  Service: Vascular;  Laterality: Left;  . SHUNTOGRAM Left November 04, 2011  . TEE WITHOUT CARDIOVERSION N/A 03/24/2014   Procedure: TRANSESOPHAGEAL ECHOCARDIOGRAM (TEE);  Surgeon: Larey Dresser, MD;  Location: [place];  Service: Cardiovascular;  Laterality: N/A;  . TEE WITHOUT CARDIOVERSION N/A 04/12/2015   Procedure: TRANSESOPHAGEAL ECHOCARDIOGRAM (TEE);  Surgeon: Larey Dresser, MD;  Location: [place];  Service: Cardiovascular;  Laterality: N/A;  . TEE WITHOUT CARDIOVERSION N/A 08/18/2015   Procedure: TRANSESOPHAGEAL ECHOCARDIOGRAM (TEE);  Surgeon: Larey Dresser, MD;  Location: [facility] ENDOSCOPY;  Service: Cardiovascular;  Laterality: N/A;       Family History  Problem Relation Age of Onset  . Diabetes Mother   . Hypertension Mother   . Hypertension Father   . Hypertension Sister   . Hypertension Brother     Social History   Tobacco Use  . Smoking status: Never  Smoker  . Smokeless tobacco: Never Used  Substance Use Topics  . Alcohol use: Yes    Alcohol/week: 0.0 standard drinks    Comment: occasional  . Drug use: Yes    Frequency: 7.0 times per week    Types: Marijuana    Comment: last use 2.9.17 @ 1900    Home Medications Prior to Admission medications   Medication Sig Start Date End Date Taking? Authorizing Provider  aspirin 81 MG tablet Take 81 mg by mouth daily.    [provider]  ibuprofen (ADVIL,MOTRIN) 200 MG tablet Take 400 mg by mouth every morning.     [provider]  iron sucrose in sodium chloride 0.9 % 100 mL Iron Sucrose (Venofer) 08/12/19  08/03/20  [provider]  lisinopril (PRINIVIL,ZESTRIL) 20 MG tablet Take 20 mg by mouth daily.    [provider]  montelukast (SINGULAIR) 10 MG tablet Take 10 mg by mouth daily. 05/28/19   [provider]  multivitamin (RENA-VIT) TABS tablet Take 1 tablet by mouth at bedtime. Patient taking differently: Take 1 tablet by mouth daily.  04/14/15   Hongalgi, Lenis Dickinson, MD  Nutritional Supplements (FEEDING SUPPLEMENT, NEPRO CARB STEADY,) LIQD Take 237 mLs by mouth 2 (two) times daily between meals. Patient taking differently: Take 237 mLs by mouth daily.  04/14/15   Hongalgi, Lenis Dickinson, MD  sevelamer carbonate (RENVELA) 800 MG tablet Take 4,000 mg by mouth 3 (three) times daily with meals.     [provider]  sucroferric oxyhydroxide (VELPHORO) 500 MG chewable tablet Chew by mouth. 08/05/17   [provider]  [facility] 160-4.5 MCG/ACT inhaler SMARTSIG:2 Puff(s) By Mouth Morning-Night 05/28/19   [provider]    Allergies    No known allergies  Review of Systems   Review of Systems  Constitutional: Negative for fatigue.  Respiratory: Negative for shortness of breath.   Cardiovascular: Negative for chest pain.  Skin: Positive for wound.  Neurological: Negative for syncope.    Physical Exam Updated Vital Signs BP (!) 158/96 (BP Location: Right Arm)   Pulse 84   Temp (!) 97.5 F (36.4 C) (Oral)   Resp 18   SpO2 100%   Physical Exam Vitals and nursing note reviewed.  Constitutional:      Appearance: He is well-developed.  HENT:     Head: Normocephalic and atraumatic.  Eyes:     Conjunctiva/sclera: Conjunctivae normal.  Pulmonary:     Effort: No respiratory distress.  Musculoskeletal:     Cervical back: Normal range of motion and neck supple.     Comments: Left upper extremity fistula with palpable thrill.  There is a small wound noted without any active bleeding.  Distal extremity appears to be well-perfused.  Patient has some  dried blood over his hands.  Skin:    General: Skin is warm and dry.  Neurological:     Mental Status: He is alert.     ED Results / Procedures / Treatments   Labs (all labs ordered are listed, but only abnormal results are displayed) Labs Reviewed - No data to display  EKG None  Radiology No results found.  Procedures Procedures (including critical care time)  Medications Ordered in ED Medications - No data to display  ED Course  I have reviewed the triage vital signs and the nursing notes.  Pertinent labs & imaging results that were available during my care of the patient were reviewed by me and considered in my medical decision making (  see chart for details).  Patient seen and examined.  I took down the pressure dressing and observe the area.  No active bleeding.  Dialysis site appears normal.  I replaced the pressure dressing with nonstick dressing, Kerlix, Coban.  Bandage was snug but not overly tight.  Patient instructed to keep the bandage on today and protect the area.  He will have the site evaluated at dialysis tomorrow.  We discussed what to do if the bleeding resumes and when to come back to the emergency department.  Patient is otherwise asymptomatic, I do not feel that lab work is indicated at this point.  Vital signs reviewed and are as follows: BP (!) 158/95 (BP Location: Right Arm)   Pulse 71   Temp 97.7 F (36.5 C) (Oral)   Resp 18   SpO2 97%       MDM Rules/Calculators/A&P                      Patient with bleeding from AV fistula site.  Now resolved.  Patient has been in the ED for 3 hours without bleeding returning.  Feel that he can go home with close monitoring and follow-up tomorrow with dialysis.  He is asymptomatic.   Final Clinical Impression(s) / ED Diagnoses Final diagnoses:  Bleeding from dialysis shunt, initial encounter [facility])    Rx / [name] Orders ED Discharge Orders    None       Carlisle Cater, Hershal Coria 11/01/19 0932    Quintella Reichert, MD 11/01/19 1030

## 2019-11-01 NOTE — Discharge Instructions (Signed)
You did a good job to control the bleeding prior to arrival.  If bleeding occurs again, hold firm pressure directly over the wound with one finger.  If the bleeding does not slow after about 20 minutes, return to the emergency department.  Otherwise keep the pressure bandage on today and protect the area.  Follow-up with dialysis tomorrow.

## 2019-11-01 NOTE — ED Notes (Signed)
Patient verbalizes understanding of discharge instructions . Opportunity for questions and answers were provided . Armband removed by staff ,Pt discharged from ED. W/C  offered at D/C  and Declined W/C at D/C and was escorted to lobby by RN.  

## 2019-11-04 ENCOUNTER — Other Ambulatory Visit: Payer: Self-pay

## 2019-11-04 ENCOUNTER — Encounter (HOSPITAL_COMMUNITY): Payer: Self-pay | Admitting: Emergency Medicine

## 2019-11-04 ENCOUNTER — Emergency Department (HOSPITAL_COMMUNITY): Payer: Medicare Other | Admitting: Anesthesiology

## 2019-11-04 ENCOUNTER — Ambulatory Visit (HOSPITAL_COMMUNITY)
Admission: EM | Admit: 2019-11-04 | Discharge: 2019-11-04 | Disposition: A | Discharge status: Home / Self Care | Payer: Medicare Other | Attending: Emergency Medicine | Admitting: Emergency Medicine

## 2019-11-04 ENCOUNTER — Encounter (HOSPITAL_COMMUNITY)
Admission: EM | Disposition: A | Discharge status: Home / Self Care | Payer: Self-pay | Source: Home / Self Care | Attending: Emergency Medicine

## 2019-11-04 DIAGNOSIS — Z833 Family history of diabetes mellitus: Secondary | ICD-10-CM | POA: Insufficient documentation

## 2019-11-04 DIAGNOSIS — Z8619 Personal history of other infectious and parasitic diseases: Secondary | ICD-10-CM | POA: Diagnosis not present

## 2019-11-04 DIAGNOSIS — Z8249 Family history of ischemic heart disease and other diseases of the circulatory system: Secondary | ICD-10-CM | POA: Insufficient documentation

## 2019-11-04 DIAGNOSIS — Y832 Surgical operation with anastomosis, bypass or graft as the cause of abnormal reaction of the patient, or of later complication, without mention of misadventure at the time of the procedure: Secondary | ICD-10-CM | POA: Diagnosis not present

## 2019-11-04 DIAGNOSIS — T82838A Hemorrhage of vascular prosthetic devices, implants and grafts, initial encounter: Secondary | ICD-10-CM | POA: Diagnosis not present

## 2019-11-04 DIAGNOSIS — Z992 Dependence on renal dialysis: Secondary | ICD-10-CM | POA: Diagnosis not present

## 2019-11-04 DIAGNOSIS — Z86718 Personal history of other venous thrombosis and embolism: Secondary | ICD-10-CM | POA: Insufficient documentation

## 2019-11-04 DIAGNOSIS — N186 End stage renal disease: Secondary | ICD-10-CM | POA: Diagnosis not present

## 2019-11-04 DIAGNOSIS — Z791 Long term (current) use of non-steroidal anti-inflammatories (NSAID): Secondary | ICD-10-CM | POA: Diagnosis not present

## 2019-11-04 DIAGNOSIS — Z7982 Long term (current) use of aspirin: Secondary | ICD-10-CM | POA: Diagnosis not present

## 2019-11-04 DIAGNOSIS — I340 Nonrheumatic mitral (valve) insufficiency: Secondary | ICD-10-CM | POA: Diagnosis not present

## 2019-11-04 DIAGNOSIS — Z7951 Long term (current) use of inhaled steroids: Secondary | ICD-10-CM | POA: Insufficient documentation

## 2019-11-04 DIAGNOSIS — Z95828 Presence of other vascular implants and grafts: Secondary | ICD-10-CM | POA: Insufficient documentation

## 2019-11-04 DIAGNOSIS — Z79899 Other long term (current) drug therapy: Secondary | ICD-10-CM | POA: Insufficient documentation

## 2019-11-04 DIAGNOSIS — Z20822 Contact with and (suspected) exposure to covid-19: Secondary | ICD-10-CM | POA: Diagnosis not present

## 2019-11-04 DIAGNOSIS — I739 Peripheral vascular disease, unspecified: Secondary | ICD-10-CM | POA: Diagnosis not present

## 2019-11-04 DIAGNOSIS — N2581 Secondary hyperparathyroidism of renal origin: Secondary | ICD-10-CM | POA: Insufficient documentation

## 2019-11-04 DIAGNOSIS — I132 Hypertensive heart and chronic kidney disease with heart failure and with stage 5 chronic kidney disease, or end stage renal disease: Secondary | ICD-10-CM | POA: Diagnosis not present

## 2019-11-04 DIAGNOSIS — R590 Localized enlarged lymph nodes: Secondary | ICD-10-CM | POA: Insufficient documentation

## 2019-11-04 DIAGNOSIS — I728 Aneurysm of other specified arteries: Secondary | ICD-10-CM | POA: Insufficient documentation

## 2019-11-04 DIAGNOSIS — Z7901 Long term (current) use of anticoagulants: Secondary | ICD-10-CM | POA: Insufficient documentation

## 2019-11-04 DIAGNOSIS — I5042 Chronic combined systolic (congestive) and diastolic (congestive) heart failure: Secondary | ICD-10-CM | POA: Diagnosis not present

## 2019-11-04 DIAGNOSIS — T829XXA Unspecified complication of cardiac and vascular prosthetic device, implant and graft, initial encounter: Secondary | ICD-10-CM

## 2019-11-04 HISTORY — PX: REVISON OF ARTERIOVENOUS FISTULA: SHX6074

## 2019-11-04 LAB — I-STAT CHEM 8, ED
BUN: 43 mg/dL — ABNORMAL HIGH (ref 6–20)
Calcium, Ion: 1.02 mmol/L — ABNORMAL LOW (ref 1.15–1.40)
Chloride: 98 mmol/L (ref 98–111)
Creatinine, Ser: 12.3 mg/dL — ABNORMAL HIGH (ref 0.61–1.24)
Glucose, Bld: 91 mg/dL (ref 70–99)
HCT: 34 % — ABNORMAL LOW (ref 39.0–52.0)
Hemoglobin: 11.6 g/dL — ABNORMAL LOW (ref 13.0–17.0)
Potassium: 5 mmol/L (ref 3.5–5.1)
Sodium: 139 mmol/L (ref 135–145)
TCO2: 32 mmol/L (ref 22–32)

## 2019-11-04 LAB — GLUCOSE, CAPILLARY: Glucose-Capillary: 96 mg/dL (ref 70–99)

## 2019-11-04 LAB — RESPIRATORY PANEL BY RT PCR (FLU A&B, COVID)
Influenza A by PCR: NEGATIVE
Influenza B by PCR: NEGATIVE
SARS Coronavirus 2 by RT PCR: NEGATIVE

## 2019-11-04 SURGERY — REVISON OF ARTERIOVENOUS FISTULA
Anesthesia: General | Site: Arm Upper | Laterality: Left

## 2019-11-04 MED ORDER — SODIUM CHLORIDE 0.9 % IV SOLN
INTRAVENOUS | Status: AC
  Filled 2019-11-04: qty 1.2

## 2019-11-04 MED ORDER — DEXAMETHASONE SODIUM PHOSPHATE 10 MG/ML IJ SOLN
INTRAMUSCULAR | Status: DC | PRN
  Administered 2019-11-04: 5 mg via INTRAVENOUS

## 2019-11-04 MED ORDER — ACETAMINOPHEN 500 MG PO TABS
ORAL_TABLET | ORAL | Status: AC
  Administered 2019-11-04: 1000 mg via ORAL
  Filled 2019-11-04: qty 2

## 2019-11-04 MED ORDER — DEXTROSE 5 % IV SOLN
3.0000 g | Freq: Once | INTRAVENOUS | Status: AC
  Administered 2019-11-04: 3 g via INTRAVENOUS
  Filled 2019-11-04: qty 3

## 2019-11-04 MED ORDER — FENTANYL CITRATE (PF) 100 MCG/2ML IJ SOLN
INTRAMUSCULAR | Status: AC
  Filled 2019-11-04: qty 2

## 2019-11-04 MED ORDER — EPHEDRINE SULFATE 50 MG/ML IJ SOLN
INTRAMUSCULAR | Status: DC | PRN
Start: 2019-11-04 — End: 2019-11-04
  Administered 2019-11-04: 10 mg via INTRAVENOUS

## 2019-11-04 MED ORDER — HYDROCODONE-ACETAMINOPHEN 5-325 MG PO TABS: 1.0000 | ORAL_TABLET | Freq: Four times a day (QID) | ORAL | 0 refills | Status: DC | PRN

## 2019-11-04 MED ORDER — ROCURONIUM BROMIDE 10 MG/ML (PF) SYRINGE
PREFILLED_SYRINGE | INTRAVENOUS | Status: DC | PRN
  Administered 2019-11-04: 10 mg via INTRAVENOUS
  Administered 2019-11-04: 20 mg via INTRAVENOUS

## 2019-11-04 MED ORDER — HYDROCODONE-ACETAMINOPHEN 5-325 MG PO TABS
1.0000 | ORAL_TABLET | Freq: Once | ORAL | Status: AC
  Administered 2019-11-04: 17:00:00 1 via ORAL

## 2019-11-04 MED ORDER — SODIUM CHLORIDE 0.9 % IV SOLN
INTRAVENOUS | Status: DC | PRN
Start: 2019-11-04 — End: 2019-11-04

## 2019-11-04 MED ORDER — HEPARIN SODIUM (PORCINE) 1000 UNIT/ML IJ SOLN
INTRAMUSCULAR | Status: AC
  Filled 2019-11-04: qty 1

## 2019-11-04 MED ORDER — NEOSTIGMINE METHYLSULFATE 10 MG/10ML IV SOLN
INTRAVENOUS | Status: DC | PRN
  Administered 2019-11-04: 3 mg via INTRAVENOUS

## 2019-11-04 MED ORDER — FENTANYL CITRATE (PF) 100 MCG/2ML IJ SOLN
25.0000 ug | INTRAMUSCULAR | Status: DC | PRN
  Administered 2019-11-04: 50 ug via INTRAVENOUS

## 2019-11-04 MED ORDER — SODIUM CHLORIDE 0.9 % IR SOLN
Status: DC | PRN
  Administered 2019-11-04: 1000 mL

## 2019-11-04 MED ORDER — PROPOFOL 10 MG/ML IV BOLUS
INTRAVENOUS | Status: AC
  Filled 2019-11-04: qty 20

## 2019-11-04 MED ORDER — HYDROCODONE-ACETAMINOPHEN 5-325 MG PO TABS
ORAL_TABLET | ORAL | Status: AC
  Filled 2019-11-04: qty 1

## 2019-11-04 MED ORDER — SUCCINYLCHOLINE CHLORIDE 20 MG/ML IJ SOLN
INTRAMUSCULAR | Status: DC | PRN
  Administered 2019-11-04: 200 mg via INTRAVENOUS

## 2019-11-04 MED ORDER — LIDOCAINE 2% (20 MG/ML) 5 ML SYRINGE
INTRAMUSCULAR | Status: DC | PRN
  Administered 2019-11-04: 40 mg via INTRAVENOUS

## 2019-11-04 MED ORDER — FENTANYL CITRATE (PF) 250 MCG/5ML IJ SOLN
INTRAMUSCULAR | Status: AC
  Filled 2019-11-04: qty 5

## 2019-11-04 MED ORDER — PROPOFOL 10 MG/ML IV BOLUS
INTRAVENOUS | Status: DC | PRN
  Administered 2019-11-04: 200 mg via INTRAVENOUS

## 2019-11-04 MED ORDER — ACETAMINOPHEN 500 MG PO TABS: 1000.0000 mg | ORAL_TABLET | Freq: Once | ORAL | Status: AC

## 2019-11-04 MED ORDER — MIDAZOLAM HCL 2 MG/2ML IJ SOLN
INTRAMUSCULAR | Status: AC
  Filled 2019-11-04: qty 2

## 2019-11-04 MED ORDER — ONDANSETRON HCL 4 MG/2ML IJ SOLN
INTRAMUSCULAR | Status: DC | PRN
  Administered 2019-11-04: 4 mg via INTRAVENOUS

## 2019-11-04 MED ORDER — SODIUM CHLORIDE 0.9 % IV SOLN: INTRAVENOUS | Status: DC

## 2019-11-04 MED ORDER — GLYCOPYRROLATE 0.2 MG/ML IJ SOLN
INTRAMUSCULAR | Status: DC | PRN
  Administered 2019-11-04: .4 mg via INTRAVENOUS

## 2019-11-04 MED ORDER — SODIUM CHLORIDE 0.9 % IV SOLN
INTRAVENOUS | Status: DC | PRN
  Administered 2019-11-04: 500 mL

## 2019-11-04 MED ORDER — FENTANYL CITRATE (PF) 100 MCG/2ML IJ SOLN
INTRAMUSCULAR | Status: DC | PRN
  Administered 2019-11-04: 50 ug via INTRAVENOUS

## 2019-11-04 MED ORDER — LIDOCAINE-EPINEPHRINE 0.5 %-1:200000 IJ SOLN
INTRAMUSCULAR | Status: AC
  Filled 2019-11-04: qty 1

## 2019-11-04 MED ORDER — PHENYLEPHRINE HCL (PRESSORS) 10 MG/ML IV SOLN
INTRAVENOUS | Status: DC | PRN
  Administered 2019-11-04: 160 ug via INTRAVENOUS
  Administered 2019-11-04: 80 ug via INTRAVENOUS

## 2019-11-04 MED ORDER — MIDAZOLAM HCL 5 MG/5ML IJ SOLN
INTRAMUSCULAR | Status: DC | PRN
  Administered 2019-11-04: 2 mg via INTRAVENOUS

## 2019-11-04 SURGICAL SUPPLY — 28 items
ARMBAND PINK RESTRICT EXTREMIT (MISCELLANEOUS) ×3 IMPLANT
CANISTER SUCT 3000ML PPV (MISCELLANEOUS) ×3 IMPLANT
CANNULA VESSEL 3MM 2 BLNT TIP (CANNULA) ×3 IMPLANT
CLIP LIGATING EXTRA MED SLVR (CLIP) ×3 IMPLANT
CLIP LIGATING EXTRA SM BLUE (MISCELLANEOUS) ×3 IMPLANT
COVER PROBE W GEL 5X96 (DRAPES) IMPLANT
COVER WAND RF STERILE (DRAPES) ×3 IMPLANT
DECANTER SPIKE VIAL GLASS SM (MISCELLANEOUS) ×3 IMPLANT
DERMABOND ADVANCED (GAUZE/BANDAGES/DRESSINGS) ×2
DERMABOND ADVANCED .7 DNX12 (GAUZE/BANDAGES/DRESSINGS) ×1 IMPLANT
ELECT REM PT RETURN 9FT ADLT (ELECTROSURGICAL) ×3
ELECTRODE REM PT RTRN 9FT ADLT (ELECTROSURGICAL) ×1 IMPLANT
GLOVE SS BIOGEL STRL SZ 7.5 (GLOVE) ×1 IMPLANT
GLOVE SUPERSENSE BIOGEL SZ 7.5 (GLOVE) ×2
GOWN STRL REUS W/ TWL LRG LVL3 (GOWN DISPOSABLE) ×3 IMPLANT
GOWN STRL REUS W/TWL LRG LVL3 (GOWN DISPOSABLE) ×9
KIT BASIN OR (CUSTOM PROCEDURE TRAY) ×3 IMPLANT
KIT TURNOVER KIT B (KITS) ×3 IMPLANT
NS IRRIG 1000ML POUR BTL (IV SOLUTION) ×3 IMPLANT
PACK CV ACCESS (CUSTOM PROCEDURE TRAY) ×3 IMPLANT
PAD ARMBOARD 7.5X6 YLW CONV (MISCELLANEOUS) ×6 IMPLANT
SUT PROLENE 5 0 C 1 24 (SUTURE) ×6 IMPLANT
SUT PROLENE 6 0 CC (SUTURE) ×3 IMPLANT
SUT VIC AB 3-0 SH 27 (SUTURE) ×3
SUT VIC AB 3-0 SH 27X BRD (SUTURE) ×1 IMPLANT
TOWEL GREEN STERILE (TOWEL DISPOSABLE) ×3 IMPLANT
UNDERPAD 30X30 (UNDERPADS AND DIAPERS) ×3 IMPLANT
WATER STERILE IRR 1000ML POUR (IV SOLUTION) ×3 IMPLANT

## 2019-11-04 NOTE — ED Triage Notes (Signed)
Pt arrives via EMS after he went to HD and his site began bleeding. Bleeding controlled at this time. Facility reports they will try to fit him in tomorrow am if they can get the site controlled.

## 2019-11-04 NOTE — Transfer of Care (Signed)
Immediate Anesthesia Transfer of Care Note  Patient: Timothy Palmer  Procedure(s) Performed: REVISON OF LEFT ARM ARTERIOVENOUS FISTULA (Left Arm Upper)  Patient Location: PACU  Anesthesia Type:General  Level of Consciousness: awake, alert  and oriented  Airway & Oxygen Therapy: Patient Spontanous Breathing and Patient connected to nasal cannula oxygen  Post-op Assessment: Report given to RN, Post -op Vital signs reviewed and stable and Patient moving all extremities  Post vital signs: Reviewed and stable  Last Vitals:  Vitals Value Taken Time  BP 157/108 11/04/19 1629  Temp    Pulse 89 11/04/19 1631  Resp 28 11/04/19 1631  SpO2 100 % 11/04/19 1631  Vitals shown include unvalidated device data.  Last Pain:  Vitals:   11/04/19 0807  TempSrc: Oral  PainSc: 0-No pain         Complications: No apparent anesthesia complications

## 2019-11-04 NOTE — Discharge Instructions (Signed)
Incision Care, Adult An incision is a surgical cut that is made through your skin. Most incisions are closed after surgery. Your incision may be closed with stitches (sutures), staples, skin glue, or adhesive strips. You may need to return to your health care provider to have sutures or staples removed. This may occur several days to several weeks after your surgery. The incision needs to be cared for properly to prevent infection. How to care for your incision Incision care   Follow instructions from your health care provider about how to take care of your incision. Make sure you: ? Wash your hands with soap and water before you change the bandage (dressing). If soap and water are not available, use hand sanitizer. ? Change your dressing as told by your health care provider. ? Leave sutures, skin glue, or adhesive strips in place. These skin closures may need to stay in place for 2 weeks or longer. If adhesive strip edges start to loosen and curl up, you may trim the loose edges. Do not remove adhesive strips completely unless your health care provider tells you to do that.  Check your incision area every day for signs of infection. Check for: ? More redness, swelling, or pain. ? More fluid or blood. ? Warmth. ? Pus or a bad smell.  Ask your health care provider how to clean the incision. This may include: ? Using mild soap and water. ? Using a clean towel to pat the incision dry after cleaning it. ? Applying a cream or ointment. Do this only as told by your health care provider. ? Covering the incision with a clean dressing.  Ask your health care provider when you can leave the incision uncovered.  Do not take baths, swim, or use a hot tub until your health care provider approves. Ask your health care provider if you can take showers. You may only be allowed to take sponge baths for bathing. Medicines  If you were prescribed an antibiotic medicine, cream, or ointment, take or apply the  antibiotic as told by your health care provider. Do not stop taking or applying the antibiotic even if your condition improves.  Take over-the-counter and prescription medicines only as told by your health care provider. General instructions  Limit movement around your incision to improve healing. ? Avoid straining, lifting, or exercise for the first month, or for as long as told by your health care provider. ? Follow instructions from your health care provider about returning to your normal activities. ? Ask your health care provider what activities are safe.  Protect your incision from the sun when you are outside for the first 6 months, or for as long as told by your health care provider. Apply sunscreen around the scar or cover it up.  Keep all follow-up visits as told by your health care provider. This is important. Contact a health care provider if:  Your have more redness, swelling, or pain around the incision.  You have more fluid or blood coming from the incision.  Your incision feels warm to the touch.  You have pus or a bad smell coming from the incision.  You have a fever or shaking chills.  You are nauseous or you vomit.  You are dizzy.  Your sutures or staples come undone. Get help right away if:  You have a red streak coming from your incision.  Your incision bleeds through the dressing and the bleeding does not stop with gentle pressure.  The edges of   your incision open up and separate.  You have severe pain.  You have a rash.  You are confused.  You faint.  You have trouble breathing and a fast heartbeat. This information is not intended to replace advice given to you by your health care provider. Make sure you discuss any questions you have with your health care provider. Document Revised: 06/26/2018 Document Reviewed: 01/10/2016 Elsevier Patient Education  2020 Elsevier Inc.   

## 2019-11-04 NOTE — ED Notes (Signed)
Pt coughed and fistula started gushing blood. Controlled with gauze, quick clot combat gauze, coban, and ace wrap. VSS following event, no change in conciousness or mental status.

## 2019-11-04 NOTE — Consult Note (Signed)
Vascular and Vein Specialist of [facility]  Patient name: Timothy Palmer MRN: [id number] DOB: 1966-03-23 Sex: male  REASON FOR VISIT: Patient presents the emergency room with recurrent bleeding from his left upper arm AV fistula  HPI: Timothy Palmer is a 54 y.o. male presents to the emergency room with recurrent bleeding from his left upper arm AV fistula.  He had seen Dr.Dickson in our office in February of this year where he had a erosion over his left upper arm basilic vein transposition fistula.  Dr. Scot Dock had recommended revision at that time the patient elected not to do this.  He presented 2 days ago to the emergency department with bleeding and was discharged.  He presents again today with bleeding from his fistula.  This was controlled with pressure.  Past Medical History:  Diagnosis Date  . Acute combined systolic and diastolic congestive heart failure ([place]) 03/21/2014  . 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 Chronic diastolic congestive heart failure ([place])   . DVT of upper extremity (deep vein thrombosis) ([place]) 03/04/2014   Right arm  . ESRD (end stage renal disease) on dialysis ([place])    [place],Dialysis- T,Th,S  . Hypertension   . 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 The aneurysm measures 1.4 x 1.1 cm which is  significantly larger than the 4.5 mm parent vessel.   . Peripheral vascular disease ([place])   . Pneumonia 2014  . 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 S/P minimally invasive mitral valve repair 04/05/2014   Complex valvuloplasty including autologous pericardial patch repair of large perforation of posterior  leaflet due to endocarditis with 28 mm Sorin Memo 3D ring annuloplasty via right mini thoracotomy approach  . Septic embolism to left lower extremity 03/25/2014  . Severe mitral regurgitation 03/23/2014   by TEE  . Shortness of breath dyspnea    with exertion  . 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 Diabetes Mother   . Hypertension Mother   . Hypertension Father   . Hypertension Sister   . Hypertension Brother     SOCIAL HISTORY: Social History   Tobacco Use  . Smoking status: Never Smoker  . Smokeless tobacco: Never Used  Substance Use Topics  . Alcohol use: Yes    Alcohol/week: 0.0 standard drinks    Comment: occasional    Allergies  Allergen Reactions  . No Known Allergies     No current facility-administered medications for this encounter.   Current Outpatient Medications  Medication Sig Dispense Refill  . aspirin 81 MG tablet Take 81 mg by mouth daily.    Marland Kitchen ibuprofen (ADVIL,MOTRIN) 200 MG tablet Take 400 mg by mouth every morning.     . iron sucrose in sodium chloride 0.9 % 100 mL Iron Sucrose (Venofer)    . lisinopril (PRINIVIL,ZESTRIL) 20 MG tablet Take 20 mg by mouth daily.    . montelukast (SINGULAIR) 10 MG tablet Take 10 mg by  mouth daily.    . multivitamin (RENA-VIT) TABS tablet Take 1 tablet by mouth at bedtime. (Patient taking differently: Take 1 tablet by mouth daily. ) 30 tablet 0  . Nutritional Supplements (FEEDING SUPPLEMENT, NEPRO CARB STEADY,) LIQD Take 237 mLs by mouth 2 (two) times daily between meals. (Patient taking differently: Take 237 mLs by mouth daily. )    . sevelamer carbonate (RENVELA) 800 MG tablet Take 4,000 mg by mouth 3 (three) times daily with meals.     . sucroferric oxyhydroxide (VELPHORO) 500 MG chewable tablet Chew by mouth.    . SYMBICORT 160-4.5 MCG/ACT inhaler SMARTSIG:2 Puff(s) By Mouth Morning-Night      REVIEW OF SYSTEMS:  [X]  denotes positive finding, [ ]  denotes negative  finding Cardiac  Comments:  Chest pain or chest pressure:    Shortness of breath upon exertion:    Short of breath when lying flat:    Irregular heart rhythm:        Vascular    Pain in calf, thigh, or hip brought on by ambulation:    Pain in feet at night that wakes you up from your sleep:     Blood clot in your veins:    Leg swelling:           PHYSICAL EXAM: Vitals:   11/04/19 0807 11/04/19 0808 11/04/19 1219  BP: (!) 145/101  (!) 164/96  Pulse: 74  77  Resp: 16  16  Temp: 98.7 F (37.1 C)  98.2 F (36.8 C)  TempSrc: Oral    SpO2: 97%  96%  Weight:  121.7 kg   Height:  5\' 11"  (1.803 m)     GENERAL: The patient is a well-nourished male, in no acute distress. The vital signs are documented above. CARDIOVASCULAR: I do not palpate a left radial pulse.  He has a Ace wrap pressure dressing on his upper arm and this was not removed.  Does have a great deal of blood in the PULMONARY: There is good air exchange  MUSCULOSKELETAL: There are no major deformities or cyanosis. NEUROLOGIC: No focal weakness or paresthesias are detected. SKIN: There are no ulcers or rashes noted. PSYCHIATRIC: The patient has a normal affect.  DATA:  None  MEDICAL ISSUES: Recurrent bleeding from left arm AV fistula with ulceration noted with recommendation for elective repair in February of this year.  Will take to the operating room for emergent repair.  Discussed this with the patient.  Understands we will attempt to avoid catheter placement with his known history of prostatic valve endocarditis.  May have to abandon this fistula and place catheter and eventual new access.  We will make this determination at the time of surgery    Rosetta Posner, MD [facility] V[facility] and Vein Specialists of [facility] Tel [phone] Pager [phone]

## 2019-11-04 NOTE — Anesthesia Postprocedure Evaluation (Signed)
Anesthesia Post Note  Patient: Timothy Palmer  Procedure(s) Performed: REVISON OF LEFT ARM ARTERIOVENOUS FISTULA (Left Arm Upper)     Patient location during evaluation: PACU Anesthesia Type: General Level of consciousness: awake Pain management: pain level controlled Vital Signs Assessment: post-procedure vital signs reviewed and stable Respiratory status: spontaneous breathing, nonlabored ventilation, respiratory function stable and patient connected to nasal cannula oxygen Cardiovascular status: blood pressure returned to baseline and stable Postop Assessment: no apparent nausea or vomiting Anesthetic complications: no    Last Vitals:  Vitals:   11/04/19 1645 11/04/19 1647  BP:  (!) 132/93  Pulse: 85 85  Resp: 19 (!) 24  Temp:  (!) 36.3 C  SpO2: 100% 99%    Last Pain:  Vitals:   11/04/19 1647  TempSrc:   PainSc: 2                  Dona Walby P Nyana Haren

## 2019-11-04 NOTE — ED Provider Notes (Signed)
[facility] EMERGENCY DEPARTMENT Provider Note   CSN: [id number] Arrival date & time: 11/04/19  0750  History Chief Complaint  Patient presents with  . Vascular Access Problem    Timothy Palmer is a 54 y.o. male with ESRD on dialysis Tu/Thur/Sat who presents with bleeding of his left arm fistula. The bleeding started bleeding Monday.  He came to the ED that day but bleeding was controlled at that time and a pressure dressing was applied and he was discharged. He went to dialysis on Tuesday and had a full session without any complications. Over night on Tuesday the bleeding started back again. He held pressure for 45 min and got it controlled. He went through 2 T shirts and a blanket. He states he went to dialysis this morning and blood was shooting out. Went to dialysis again today and started bleeding so he was sent to the ED. He states he is scared he is going to bleed out. Per chart review he saw vascular surgery in Feb and surgery was recommended to remove the eschar however he declined surgical intervention at that time because he doesn't like to be operated on. NPO since last night. No hx of COVID and has not had his COVID vaccine.  HPI     Past Medical History:  Diagnosis Date  . Acute combined systolic and diastolic congestive heart failure ([place]) 03/21/2014  . 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 Chronic diastolic congestive heart failure ([place])   . DVT of upper extremity (deep vein thrombosis) ([place]) 03/04/2014   Right arm  . ESRD (end stage renal disease) on dialysis ([place])    [place],Dialysis- T,Th,S  . Hypertension   . 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 The aneurysm measures 1.4 x 1.1 cm which is  significantly larger  than the 4.5 mm parent vessel.   . Peripheral vascular disease ([place])   . Pneumonia 2014  . 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 Septic embolism to left lower extremity 03/25/2014  . Severe mitral regurgitation 03/23/2014   by TEE  . Shortness of breath dyspnea    with exertion  . 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 Secondary hyperparathyroidism of renal origin ([place]) 03/22/2016  . Hyperparathyroidism, secondary ([place]) 03/21/2016  . Lung nodules   . ESRD on dialysis ([place])   . Mediastinal adenopathy   . 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 Renal mass   . Cavitary lesion of lung 04/06/2015  . Mitral valve replaced 04/06/2015  . Anticoagulant long-term use 04/06/2015  . Anemia in chronic kidney disease 04/06/2015  . Coagulopathy ([place]) 04/06/2015  . Multiple pulmonary nodules   . Endocarditis 04/05/2015  . Weakness of left lower extremity 11/14/2014  . Encounter for therapeutic drug monitoring 06/16/2014  . Paroxysmal atrial fibrillation ([place]) 06/13/2014  . S/P minimally invasive mitral valve repair 04/05/2014  . Septic embolism to left lower extremity 03/25/2014  .  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 Chronic diastolic congestive heart failure ([place])   . Mitral valve vegetation 03/23/2014  . Severe mitral regurgitation 03/23/2014  . Hypertension 03/22/2014  . Acute bronchitis 03/22/2014  . T wave inversion in EKG 03/22/2014  . QT prolongation 03/22/2014  . Acute combined systolic and diastolic congestive heart  failure ([place]) 03/21/2014  . 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 Knee pain, left 03/21/2014  . DVT of upper extremity (deep vein thrombosis) ([place]) 03/04/2014  . Positive blood culture 11/27/2013  . End stage renal disease ([place]) 06/30/2013    Past Surgical History:  Procedure Laterality Date  . AV FISTULA PLACEMENT Left ?2005   forearm   . BASCILIC VEIN TRANSPOSITION Left 12/27/2015   Procedure: FIRST STAGE BASILIC VEIN TRANSPOSITION;  Surgeon: Conrad Optima, MD;  Location: [facility];  Service: Vascular;  Laterality: Left;  . BASCILIC VEIN TRANSPOSITION Left 02/12/2016   Procedure: SECOND STAGE BASILIC VEIN TRANSPOSITION;  Surgeon: Conrad Brookwood, MD;  Location: [facility];  Service: Vascular;  Laterality: Left;  . EMBOLECTOMY Left 03/25/2014   Procedure: EMBOLECTOMY left popliteal;  Surgeon: Rosetta Posner, MD;  Location: [facility];  Service: Vascular;  Laterality: Left;  . FEMORAL-POPLITEAL BYPASS GRAFT Left 03/25/2014   Procedure: Left Femoral- Below Knee Popliteal Bypass Graft;  Surgeon: Rosetta Posner, MD;  Location: [place];  Service: Vascular;  Laterality: Left;  . INTRAOPERATIVE TRANSESOPHAGEAL ECHOCARDIOGRAM N/A 04/05/2014   Procedure: INTRAOPERATIVE TRANSESOPHAGEAL ECHOCARDIOGRAM;  Surgeon: Rexene Alberts, MD;  Location: [facility];  Service: Open Heart Surgery;  Laterality: N/A;  . LEFT AND RIGHT HEART CATHETERIZATION WITH CORONARY ANGIOGRAM N/A 03/28/2014   Procedure: LEFT AND RIGHT HEART CATHETERIZATION WITH CORONARY ANGIOGRAM;  Surgeon: Larey Dresser, MD;  Location: [facility] CATH LAB;  Service: Cardiovascular;  Laterality: N/A;  . MITRAL VALVE REPAIR Right 04/05/2014   Procedure: MINIMALLY INVASIVE MITRAL VALVE REPAIR (MVR);  Surgeon: Rexene Alberts, MD;  Location: [place] [facility];  Service: Open Heart Surgery;  Laterality: Right;  . MITRAL VALVE REPLACEMENT Right 04/05/2014   Procedure: Bring back MINIMALLY  INVASIVE MITRAL VALVE (MV) REPLACEMENT - Reexploration for bleeding;  Surgeon: Rexene Alberts, MD;  Location: [facility];  Service: Open Heart Surgery;  Laterality: Right;  . PARATHYROIDECTOMY N/A 03/22/2016   Procedure: PARATHYROIDECTOMY AUTOTRANSPLANT;  Surgeon: Armandina Gemma, MD;  Location: [facility];  Service: General;  Laterality: N/A;  . PATCH ANGIOPLASTY Left 07/23/2013   Procedure: PATCH ANGIOPLASTY- LEFT RADIOCEPHALIC ARTERIOVENOUS FISTULA;  Surgeon: Angelia Mould, MD;  Location: [place];  Service: Vascular;  Laterality: Left;  . SHUNTOGRAM Left November 04, 2011  . TEE WITHOUT CARDIOVERSION N/A 03/24/2014   Procedure: TRANSESOPHAGEAL ECHOCARDIOGRAM (TEE);  Surgeon: Larey Dresser, MD;  Location: [facility];  Service: Cardiovascular;  Laterality: N/A;  . TEE WITHOUT CARDIOVERSION N/A 04/12/2015   Procedure: TRANSESOPHAGEAL ECHOCARDIOGRAM (TEE);  Surgeon: Larey Dresser, MD;  Location: [place];  Service: Cardiovascular;  Laterality: N/A;  . TEE WITHOUT CARDIOVERSION N/A 08/18/2015   Procedure: TRANSESOPHAGEAL ECHOCARDIOGRAM (TEE);  Surgeon: Larey Dresser, MD;  Location: [facility] ENDOSCOPY;  Service: Cardiovascular;  Laterality: N/A;       Family History  Problem Relation Age of Onset  . Diabetes Mother   . Hypertension Mother   . Hypertension Father   . Hypertension Sister   . Hypertension Brother  Social History   Tobacco Use  . Smoking status: Never Smoker  . Smokeless tobacco: Never Used  Substance Use Topics  . Alcohol use: Yes    Alcohol/week: 0.0 standard drinks    Comment: occasional  . Drug use: Yes    Frequency: 7.0 times per week    Types: Marijuana    Comment: last use 2.9.17 @ 1900    Home Medications Prior to Admission medications   Medication Sig Start Date End Date Taking? Authorizing Provider  aspirin 81 MG tablet Take 81 mg by mouth daily.    [provider]  ibuprofen (ADVIL,MOTRIN) 200 MG tablet Take 400 mg by mouth every morning.      [provider]  iron sucrose in sodium chloride 0.9 % 100 mL Iron Sucrose (Venofer) 08/12/19 08/03/20  [provider]  lisinopril (PRINIVIL,ZESTRIL) 20 MG tablet Take 20 mg by mouth daily.    [provider]  montelukast (SINGULAIR) 10 MG tablet Take 10 mg by mouth daily. 05/28/19   [provider]  multivitamin (RENA-VIT) TABS tablet Take 1 tablet by mouth at bedtime. Patient taking differently: Take 1 tablet by mouth daily.  04/14/15   Hongalgi, Lenis Dickinson, MD  Nutritional Supplements (FEEDING SUPPLEMENT, NEPRO CARB STEADY,) LIQD Take 237 mLs by mouth 2 (two) times daily between meals. Patient taking differently: Take 237 mLs by mouth daily.  04/14/15   Hongalgi, Lenis Dickinson, MD  sevelamer carbonate (RENVELA) 800 MG tablet Take 4,000 mg by mouth 3 (three) times daily with meals.     [provider]  sucroferric oxyhydroxide (VELPHORO) 500 MG chewable tablet Chew by mouth. 08/05/17   [provider]  [facility] 160-4.5 MCG/ACT inhaler SMARTSIG:2 Puff(s) By Mouth Morning-Night 05/28/19   [provider]    Allergies    No known allergies  Review of Systems   Review of Systems  Respiratory: Positive for shortness of breath (chronic per pt).   Cardiovascular: Negative for chest pain.       +bleeding fistula  Neurological: Negative for syncope, weakness and light-headedness.  Hematological: Bruises/bleeds easily.  All other systems reviewed and are negative.   Physical Exam Updated Vital Signs BP (!) 145/101   Pulse 74   Temp 98.7 F (37.1 C) (Oral)   Resp 16   Ht 5\' 11"  (1.803 m)   Wt 121.7 kg   SpO2 97%   BMI 37.42 kg/m   Physical Exam Vitals and nursing note reviewed.  Constitutional:      General: He is not in acute distress.    Appearance: Normal appearance. He is well-developed. He is not ill-appearing.  HENT:     Head: Normocephalic and atraumatic.  Eyes:     General: No scleral icterus.       Right eye: No  discharge.        Left eye: No discharge.     Conjunctiva/sclera: Conjunctivae normal.     Pupils: Pupils are equal, round, and reactive to light.  Cardiovascular:     Rate and Rhythm: Normal rate.  Pulmonary:     Effort: Pulmonary effort is normal. No respiratory distress.  Abdominal:     General: There is no distension.  Musculoskeletal:     Cervical back: Normal range of motion.     Comments: Palpable thrill over the L AV fistula. There is an area of eschar which is not bleeding initially however pt coughed and blood shot out from the wound.  Skin:    General: Skin  is warm and dry.  Neurological:     Mental Status: He is alert and oriented to person, place, and time.  Psychiatric:        Behavior: Behavior normal.     ED Results / Procedures / Treatments   Labs (all labs ordered are listed, but only abnormal results are displayed) Labs Reviewed  I-STAT CHEM 8, ED - Abnormal; Notable for the following components:      Result Value   BUN 43 (*)    Creatinine, Ser 12.30 (*)    Calcium, Ion 1.02 (*)    Hemoglobin 11.6 (*)    HCT 34.0 (*)    All other components within normal limits  RESPIRATORY PANEL BY RT PCR (FLU A&B, COVID)    EKG None  Radiology No results found.  Procedures Procedures (including critical care time)  Medications Ordered in ED Medications - No data to display  ED Course  I have reviewed the triage vital signs and the nursing notes.  Pertinent labs & imaging results that were available during my care of the patient were reviewed by me and considered in my medical decision making (see chart for details).  54 year old male presents with bleeding fistula which is been bleeding intermittently for the past several days.  Vital signs are reassuring. Patient was unable to be dialyzed today due to uncontrolled bleeding.  On initial exam bleeding was controlled.  Initial plan was to have patient follow-up with vascular surgery as an outpatient since the  surgical intervention was recommended in February however patient was not ready at that time.  When patient was up for discharge he coughed and the bleeding resumed. Another pressure dressing was applied along with combat gauze, quick clot, and coban. Discussed with Dr. Jeanell Sparrow - will consult vascular surgery.  12:39 PM Discussed with Dr. Donnetta Hutching - he will come to see pt. Re-evaluated pt - he is having some numbness and discoloration of the hand. The pressure dressing was loosened which helped.  Pt going to OR   MDM Rules/Calculators/A&P                       Final Clinical Impression(s) / ED Diagnoses Final diagnoses:  Complication of vascular access for dialysis, initial encounter    Rx / DC Orders ED Discharge Orders    None       Recardo Evangelist, PA-C 11/04/19 1350    Pattricia Boss, MD 11/05/19 [phone]

## 2019-11-04 NOTE — Op Note (Signed)
    OPERATIVE REPORT  DATE OF SURGERY: 11/04/2019  PATIENT: Timothy Palmer, 54 y.o. male MRN: [id number]  DOB: [birth year]  PRE-OPERATIVE DIAGNOSIS: Bleeding from erosion and left upper arm AV fistula  POST-OPERATIVE DIAGNOSIS:  Same  PROCEDURE: Revision of left upper arm AV fistula with excision of eroded segment and primary closure  SURGEON:  Curt Jews, M.D.  PHYSICIAN ASSISTANT: Matt Eveland, PA-C  ANESTHESIA: General  EBL: per anesthesia record  Total I/O In: 550 [I.V.:500; IV Piggyback:50] Out: -   BLOOD ADMINISTERED: none  DRAINS: none  SPECIMEN: none  COUNTS CORRECT:  YES  PATIENT DISPOSITION:  PACU - hemodynamically stable  PROCEDURE DETAILS: Patient was taken operating placed supine position where the area of the left arm was prepped draped in sterile fashion.  Had several episodes of bleeding the last 2 days.  Pneumatic tourniquet was placed in the upper arm above the eroded segment.  The arm was elevated and exsanguinated with an Esmarch tourniquet and the pneumatic tourniquet was inflated.  The eroded skin was removed and an ellipse and the eroded segment of the basilic vein fistula was excised.  The viable vein wall was adequate for primary closure and still allowing flow through this segment.  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  The vein was closed in 2 layers with 5-0 Prolene suture.  The pneumatic tourniquet was deflated and hemostasis was adequate.  The wound was irrigated with saline.  Hemostasis obtained after cautery.  The wound was closed with 3-0 Vicryl in the subcutaneous and subcuticular tissue.  Sterile dressing was applied and the patient was transferred to the recovery room in stable condition   Rosetta Posner, M.D., [facility] 11/04/2019 4:13 PM

## 2019-11-04 NOTE — Anesthesia Preprocedure Evaluation (Addendum)
Anesthesia Evaluation  Patient identified by MRN, date of birth, ID band Patient awake    Reviewed: Allergy & Precautions, H&P , NPO status , Patient's Chart, lab work & pertinent test results  Airway Mallampati: III  TM Distance: >3 FB Neck ROM: Full    Dental  (+) Poor Dentition, Loose, Missing,    Pulmonary shortness of breath,    Pulmonary exam normal breath sounds clear to auscultation       Cardiovascular hypertension, Pt. on medications + Peripheral Vascular Disease, +CHF and + DVT  Normal cardiovascular exam+ Valvular Problems/Murmurs (s/p MV replacement 2016) MR  Rhythm:Regular Rate:Normal  TEE 2017 EF 55-60%, AV normal  Aorta: Normal caliber aorta with grade III-IV plaque in  descending thoracic aorta.  - Mitral valve: The mitral valve is status post repair. There is a 7 mm x 2 mm mobile vegetation attached to the anterior sewing ring. There is trivial MR and probably no significant mitral stenosis.  - Left atrium: The atrium was mildly dilated. No evidence of hrombus in the atrial cavity or appendage.  - Right ventricle: The cavity size was normal. Systolic function was normal.   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)  negative genitourinary   Musculoskeletal   Abdominal   Peds  Hematology  (+) Blood dyscrasia, anemia ,   Anesthesia Other Findings Bleeding AV fistula  Reproductive/Obstetrics negative OB ROS                          Anesthesia Physical Anesthesia Plan  ASA: III  Anesthesia Plan: General   Post-op Pain Management:    Induction: Intravenous  PONV Risk Score and Plan: 3 and Ondansetron, Dexamethasone and Midazolam  Airway Management Planned: Oral ETT  Additional Equipment:   Intra-op Plan:    Post-operative Plan: Extubation in OR  Informed Consent: I have reviewed the patients History and Physical, chart, labs and discussed the procedure including the risks, benefits and alternatives for the proposed anesthesia with the patient or authorized representative who has indicated his/her understanding and acceptance.     Dental advisory given  Plan Discussed with: CRNA  Anesthesia Plan Comments:        Anesthesia Quick Evaluation

## 2019-11-04 NOTE — Anesthesia Procedure Notes (Signed)
Procedure Name: Intubation Date/Time: 11/04/2019 3:31 PM Performed by: Trinna Post., CRNA Pre-anesthesia Checklist: Patient identified, Emergency Drugs available, Suction available, Timeout performed and Patient being monitored Patient Re-evaluated:Patient Re-evaluated prior to induction Oxygen Delivery Method: Circle system utilized Preoxygenation: Pre-oxygenation with 100% oxygen Induction Type: IV induction, Rapid sequence and Cricoid Pressure applied Laryngoscope Size: Mac and 4 Grade View: Grade II Tube type: Oral Tube size: 7.5 mm Number of attempts: 1 Airway Equipment and Method: Stylet Placement Confirmation: ETT inserted through vocal cords under direct vision,  positive ETCO2 and breath sounds checked- equal and bilateral Secured at: 23 cm Tube secured with: Tape Dental Injury: Teeth and Oropharynx as per pre-operative assessment

## 2023-03-29 ENCOUNTER — Inpatient Hospital Stay (HOSPITAL_COMMUNITY): Payer: Medicare Other

## 2023-03-29 ENCOUNTER — Other Ambulatory Visit: Payer: Self-pay

## 2023-03-29 ENCOUNTER — Inpatient Hospital Stay (HOSPITAL_COMMUNITY)
Admission: EM | Admit: 2023-03-29 | Discharge: 2023-04-09 | DRG: 870 | Disposition: A | Discharge status: Hospice | Payer: Medicare Other | Attending: Pulmonary Disease | Admitting: Pulmonary Disease

## 2023-03-29 ENCOUNTER — Emergency Department (HOSPITAL_COMMUNITY): Payer: Medicare Other

## 2023-03-29 ENCOUNTER — Encounter (HOSPITAL_COMMUNITY): Payer: Self-pay

## 2023-03-29 DIAGNOSIS — Z635 Disruption of family by separation and divorce: Secondary | ICD-10-CM

## 2023-03-29 DIAGNOSIS — Z515 Encounter for palliative care: Secondary | ICD-10-CM

## 2023-03-29 DIAGNOSIS — Z8619 Personal history of other infectious and parasitic diseases: Secondary | ICD-10-CM

## 2023-03-29 DIAGNOSIS — I50810 Right heart failure, unspecified: Secondary | ICD-10-CM | POA: Diagnosis not present

## 2023-03-29 DIAGNOSIS — I5084 End stage heart failure: Secondary | ICD-10-CM | POA: Diagnosis not present

## 2023-03-29 DIAGNOSIS — J690 Pneumonitis due to inhalation of food and vomit: Secondary | ICD-10-CM | POA: Diagnosis not present

## 2023-03-29 DIAGNOSIS — Z79899 Other long term (current) drug therapy: Secondary | ICD-10-CM

## 2023-03-29 DIAGNOSIS — K761 Chronic passive congestion of liver: Secondary | ICD-10-CM | POA: Diagnosis present

## 2023-03-29 DIAGNOSIS — M898X9 Other specified disorders of bone, unspecified site: Secondary | ICD-10-CM | POA: Diagnosis present

## 2023-03-29 DIAGNOSIS — E669 Obesity, unspecified: Secondary | ICD-10-CM | POA: Diagnosis present

## 2023-03-29 DIAGNOSIS — I959 Hypotension, unspecified: Secondary | ICD-10-CM | POA: Diagnosis present

## 2023-03-29 DIAGNOSIS — N179 Acute kidney failure, unspecified: Secondary | ICD-10-CM | POA: Diagnosis not present

## 2023-03-29 DIAGNOSIS — K8020 Calculus of gallbladder without cholecystitis without obstruction: Secondary | ICD-10-CM | POA: Diagnosis present

## 2023-03-29 DIAGNOSIS — E162 Hypoglycemia, unspecified: Secondary | ICD-10-CM | POA: Diagnosis present

## 2023-03-29 DIAGNOSIS — Z1152 Encounter for screening for COVID-19: Secondary | ICD-10-CM

## 2023-03-29 DIAGNOSIS — A419 Sepsis, unspecified organism: Principal | ICD-10-CM | POA: Diagnosis present

## 2023-03-29 DIAGNOSIS — E872 Acidosis, unspecified: Secondary | ICD-10-CM | POA: Diagnosis present

## 2023-03-29 DIAGNOSIS — Z7901 Long term (current) use of anticoagulants: Secondary | ICD-10-CM

## 2023-03-29 DIAGNOSIS — D631 Anemia in chronic kidney disease: Secondary | ICD-10-CM | POA: Diagnosis present

## 2023-03-29 DIAGNOSIS — I4510 Unspecified right bundle-branch block: Secondary | ICD-10-CM | POA: Diagnosis not present

## 2023-03-29 DIAGNOSIS — Z833 Family history of diabetes mellitus: Secondary | ICD-10-CM

## 2023-03-29 DIAGNOSIS — I4892 Unspecified atrial flutter: Secondary | ICD-10-CM | POA: Diagnosis not present

## 2023-03-29 DIAGNOSIS — Z952 Presence of prosthetic heart valve: Secondary | ICD-10-CM

## 2023-03-29 DIAGNOSIS — N186 End stage renal disease: Secondary | ICD-10-CM | POA: Diagnosis present

## 2023-03-29 DIAGNOSIS — E876 Hypokalemia: Secondary | ICD-10-CM | POA: Diagnosis not present

## 2023-03-29 DIAGNOSIS — Z66 Do not resuscitate: Secondary | ICD-10-CM | POA: Diagnosis not present

## 2023-03-29 DIAGNOSIS — I5031 Acute diastolic (congestive) heart failure: Secondary | ICD-10-CM | POA: Diagnosis not present

## 2023-03-29 DIAGNOSIS — R570 Cardiogenic shock: Secondary | ICD-10-CM | POA: Diagnosis present

## 2023-03-29 DIAGNOSIS — Z86718 Personal history of other venous thrombosis and embolism: Secondary | ICD-10-CM

## 2023-03-29 DIAGNOSIS — I472 Ventricular tachycardia, unspecified: Secondary | ICD-10-CM | POA: Diagnosis not present

## 2023-03-29 DIAGNOSIS — Z6832 Body mass index (BMI) 32.0-32.9, adult: Secondary | ICD-10-CM

## 2023-03-29 DIAGNOSIS — Q613 Polycystic kidney, unspecified: Secondary | ICD-10-CM | POA: Diagnosis not present

## 2023-03-29 DIAGNOSIS — R197 Diarrhea, unspecified: Secondary | ICD-10-CM

## 2023-03-29 DIAGNOSIS — I471 Supraventricular tachycardia, unspecified: Secondary | ICD-10-CM | POA: Diagnosis not present

## 2023-03-29 DIAGNOSIS — I700 Atherosclerosis of aorta: Secondary | ICD-10-CM | POA: Diagnosis present

## 2023-03-29 DIAGNOSIS — R579 Shock, unspecified: Secondary | ICD-10-CM | POA: Diagnosis not present

## 2023-03-29 DIAGNOSIS — E43 Unspecified severe protein-calorie malnutrition: Secondary | ICD-10-CM | POA: Diagnosis present

## 2023-03-29 DIAGNOSIS — Z9889 Other specified postprocedural states: Secondary | ICD-10-CM | POA: Diagnosis not present

## 2023-03-29 DIAGNOSIS — K828 Other specified diseases of gallbladder: Secondary | ICD-10-CM | POA: Diagnosis present

## 2023-03-29 DIAGNOSIS — I350 Nonrheumatic aortic (valve) stenosis: Secondary | ICD-10-CM

## 2023-03-29 DIAGNOSIS — Z7982 Long term (current) use of aspirin: Secondary | ICD-10-CM

## 2023-03-29 DIAGNOSIS — R451 Restlessness and agitation: Secondary | ICD-10-CM | POA: Diagnosis not present

## 2023-03-29 DIAGNOSIS — J9601 Acute respiratory failure with hypoxia: Secondary | ICD-10-CM | POA: Diagnosis present

## 2023-03-29 DIAGNOSIS — I132 Hypertensive heart and chronic kidney disease with heart failure and with stage 5 chronic kidney disease, or end stage renal disease: Secondary | ICD-10-CM | POA: Diagnosis present

## 2023-03-29 DIAGNOSIS — I440 Atrioventricular block, first degree: Secondary | ICD-10-CM | POA: Diagnosis not present

## 2023-03-29 DIAGNOSIS — Z7189 Other specified counseling: Secondary | ICD-10-CM | POA: Diagnosis not present

## 2023-03-29 DIAGNOSIS — Z634 Disappearance and death of family member: Secondary | ICD-10-CM

## 2023-03-29 DIAGNOSIS — N5089 Other specified disorders of the male genital organs: Secondary | ICD-10-CM | POA: Diagnosis present

## 2023-03-29 DIAGNOSIS — R34 Anuria and oliguria: Secondary | ICD-10-CM | POA: Diagnosis present

## 2023-03-29 DIAGNOSIS — R739 Hyperglycemia, unspecified: Secondary | ICD-10-CM | POA: Diagnosis not present

## 2023-03-29 DIAGNOSIS — I5082 Biventricular heart failure: Secondary | ICD-10-CM | POA: Diagnosis not present

## 2023-03-29 DIAGNOSIS — Z8249 Family history of ischemic heart disease and other diseases of the circulatory system: Secondary | ICD-10-CM

## 2023-03-29 DIAGNOSIS — I480 Paroxysmal atrial fibrillation: Secondary | ICD-10-CM | POA: Diagnosis not present

## 2023-03-29 DIAGNOSIS — Z992 Dependence on renal dialysis: Secondary | ICD-10-CM

## 2023-03-29 DIAGNOSIS — R1110 Vomiting, unspecified: Secondary | ICD-10-CM | POA: Diagnosis not present

## 2023-03-29 DIAGNOSIS — I5043 Acute on chronic combined systolic (congestive) and diastolic (congestive) heart failure: Secondary | ICD-10-CM | POA: Diagnosis present

## 2023-03-29 DIAGNOSIS — R578 Other shock: Secondary | ICD-10-CM

## 2023-03-29 DIAGNOSIS — W19XXXA Unspecified fall, initial encounter: Secondary | ICD-10-CM | POA: Diagnosis present

## 2023-03-29 DIAGNOSIS — K567 Ileus, unspecified: Secondary | ICD-10-CM | POA: Diagnosis not present

## 2023-03-29 DIAGNOSIS — I739 Peripheral vascular disease, unspecified: Secondary | ICD-10-CM | POA: Diagnosis present

## 2023-03-29 DIAGNOSIS — R9431 Abnormal electrocardiogram [ECG] [EKG]: Secondary | ICD-10-CM | POA: Diagnosis not present

## 2023-03-29 DIAGNOSIS — I340 Nonrheumatic mitral (valve) insufficiency: Secondary | ICD-10-CM | POA: Diagnosis not present

## 2023-03-29 DIAGNOSIS — R6521 Severe sepsis with septic shock: Secondary | ICD-10-CM | POA: Diagnosis present

## 2023-03-29 DIAGNOSIS — E871 Hypo-osmolality and hyponatremia: Secondary | ICD-10-CM | POA: Diagnosis not present

## 2023-03-29 DIAGNOSIS — I2721 Secondary pulmonary arterial hypertension: Secondary | ICD-10-CM | POA: Diagnosis present

## 2023-03-29 DIAGNOSIS — E875 Hyperkalemia: Secondary | ICD-10-CM | POA: Diagnosis present

## 2023-03-29 DIAGNOSIS — B3324 Viral cardiomyopathy: Secondary | ICD-10-CM | POA: Diagnosis present

## 2023-03-29 LAB — PROTIME-INR
INR: 1.8 — ABNORMAL HIGH (ref 0.8–1.2)
Prothrombin Time: 20.8 seconds — ABNORMAL HIGH (ref 11.4–15.2)

## 2023-03-29 LAB — RESPIRATORY PANEL BY PCR

## 2023-03-29 LAB — POCT I-STAT 7, (LYTES, BLD GAS, ICA,H+H)
Acid-base deficit: 5 mmol/L — ABNORMAL HIGH (ref 0.0–2.0)
Bicarbonate: 20 mmol/L (ref 20.0–28.0)
Calcium, Ion: 0.72 mmol/L — CL (ref 1.15–1.40)
HCT: 38 % — ABNORMAL LOW (ref 39.0–52.0)
Hemoglobin: 12.9 g/dL — ABNORMAL LOW (ref 13.0–17.0)
O2 Saturation: 100 %
Potassium: 6.5 mmol/L (ref 3.5–5.1)
Sodium: 133 mmol/L — ABNORMAL LOW (ref 135–145)
TCO2: 21 mmol/L — ABNORMAL LOW (ref 22–32)
pCO2 arterial: 34.9 mmHg (ref 32–48)
pH, Arterial: 7.367 (ref 7.35–7.45)
pO2, Arterial: 437 mmHg — ABNORMAL HIGH (ref 83–108)

## 2023-03-29 LAB — ECHOCARDIOGRAM COMPLETE
AR max vel: 0.54 cm2
AV Area VTI: 0.53 cm2
AV Area mean vel: 0.48 cm2
AV Mean grad: 32.7 mmHg
AV Peak grad: 64.1 mmHg
Ao pk vel: 4 m/s
Area-P 1/2: 3.12 cm2
Height: 71 in
MV M vel: 0.76 m/s
MV Peak grad: 2.3 mmHg
MV VTI: 0.6 cm2
S' Lateral: 5.1 cm
Weight: 3840 oz

## 2023-03-29 LAB — COMPREHENSIVE METABOLIC PANEL
ALT: 96 U/L — ABNORMAL HIGH (ref 0–44)
AST: 445 U/L — ABNORMAL HIGH (ref 15–41)
Albumin: 2.2 g/dL — ABNORMAL LOW (ref 3.5–5.0)
Alkaline Phosphatase: 165 U/L — ABNORMAL HIGH (ref 38–126)
Anion gap: 29 — ABNORMAL HIGH (ref 5–15)
BUN: 70 mg/dL — ABNORMAL HIGH (ref 6–20)
CO2: 17 mmol/L — ABNORMAL LOW (ref 22–32)
Calcium: 7 mg/dL — ABNORMAL LOW (ref 8.9–10.3)
Chloride: 89 mmol/L — ABNORMAL LOW (ref 98–111)
Creatinine, Ser: 10.07 mg/dL — ABNORMAL HIGH (ref 0.61–1.24)
GFR, Estimated: 6 mL/min — ABNORMAL LOW (ref >60)
Glucose, Bld: 64 mg/dL — ABNORMAL LOW (ref 70–99)
Potassium: 5.9 mmol/L — ABNORMAL HIGH (ref 3.5–5.1)
Sodium: 135 mmol/L (ref 135–145)
Total Bilirubin: 8.6 mg/dL — ABNORMAL HIGH (ref 0.3–1.2)
Total Protein: 7.6 g/dL (ref 6.5–8.1)

## 2023-03-29 LAB — CBC WITH DIFFERENTIAL/PLATELET
Abs Immature Granulocytes: 0.25 10*3/uL — ABNORMAL HIGH (ref 0.00–0.07)
Basophils Absolute: 0.1 10*3/uL (ref 0.0–0.1)
Basophils Relative: 1 %
Eosinophils Absolute: 0.2 10*3/uL (ref 0.0–0.5)
Eosinophils Relative: 1 %
HCT: 38.4 % — ABNORMAL LOW (ref 39.0–52.0)
Hemoglobin: 12.2 g/dL — ABNORMAL LOW (ref 13.0–17.0)
Immature Granulocytes: 1 %
Lymphocytes Relative: 4 %
Lymphs Abs: 1 10*3/uL (ref 0.7–4.0)
MCH: 28.2 pg (ref 26.0–34.0)
MCHC: 31.8 g/dL (ref 30.0–36.0)
MCV: 88.9 fL (ref 80.0–100.0)
Monocytes Absolute: 1.1 10*3/uL — ABNORMAL HIGH (ref 0.1–1.0)
Monocytes Relative: 5 %
Neutro Abs: 20 10*3/uL — ABNORMAL HIGH (ref 1.7–7.7)
Neutrophils Relative %: 88 %
Platelets: 236 10*3/uL (ref 150–400)
RBC: 4.32 MIL/uL (ref 4.22–5.81)
RDW: 17.6 % — ABNORMAL HIGH (ref 11.5–15.5)
WBC: 22.6 10*3/uL — ABNORMAL HIGH (ref 4.0–10.5)
nRBC: 0.3 % — ABNORMAL HIGH (ref 0.0–0.2)

## 2023-03-29 LAB — CBG MONITORING, ED
Glucose-Capillary: 15 mg/dL — CL (ref 70–99)
Glucose-Capillary: 227 mg/dL — ABNORMAL HIGH (ref 70–99)
Glucose-Capillary: 55 mg/dL — ABNORMAL LOW (ref 70–99)
Glucose-Capillary: 75 mg/dL (ref 70–99)
Glucose-Capillary: 72 mg/dL (ref 70–99)

## 2023-03-29 LAB — POTASSIUM
Potassium: 6.2 mmol/L — ABNORMAL HIGH (ref 3.5–5.1)
Potassium: 6.7 mmol/L (ref 3.5–5.1)

## 2023-03-29 LAB — I-STAT CHEM 8, ED
BUN: 80 mg/dL — ABNORMAL HIGH (ref 6–20)
Calcium, Ion: 0.7 mmol/L — CL (ref 1.15–1.40)
Chloride: 97 mmol/L — ABNORMAL LOW (ref 98–111)
Creatinine, Ser: 10.6 mg/dL — ABNORMAL HIGH (ref 0.61–1.24)
Glucose, Bld: 68 mg/dL — ABNORMAL LOW (ref 70–99)
HCT: 38 % — ABNORMAL LOW (ref 39.0–52.0)
Hemoglobin: 12.9 g/dL — ABNORMAL LOW (ref 13.0–17.0)
Potassium: 6.3 mmol/L (ref 3.5–5.1)
Sodium: 133 mmol/L — ABNORMAL LOW (ref 135–145)
TCO2: 21 mmol/L — ABNORMAL LOW (ref 22–32)

## 2023-03-29 LAB — BASIC METABOLIC PANEL
Anion gap: 33 — ABNORMAL HIGH (ref 5–15)
BUN: 79 mg/dL — ABNORMAL HIGH (ref 6–20)
CO2: 14 mmol/L — ABNORMAL LOW (ref 22–32)
Calcium: 7.9 mg/dL — ABNORMAL LOW (ref 8.9–10.3)
Chloride: 89 mmol/L — ABNORMAL LOW (ref 98–111)
Creatinine, Ser: 10.23 mg/dL — ABNORMAL HIGH (ref 0.61–1.24)
GFR, Estimated: 5 mL/min — ABNORMAL LOW (ref >60)
Glucose, Bld: 67 mg/dL — ABNORMAL LOW (ref 70–99)
Potassium: 6.9 mmol/L (ref 3.5–5.1)
Sodium: 136 mmol/L (ref 135–145)

## 2023-03-29 LAB — CBC
HCT: 34.9 % — ABNORMAL LOW (ref 39.0–52.0)
HCT: 37.6 % — ABNORMAL LOW (ref 39.0–52.0)
Hemoglobin: 11.6 g/dL — ABNORMAL LOW (ref 13.0–17.0)
Hemoglobin: 11.8 g/dL — ABNORMAL LOW (ref 13.0–17.0)
MCH: 28.8 pg (ref 26.0–34.0)
MCH: 28.5 pg (ref 26.0–34.0)
MCHC: 33.2 g/dL (ref 30.0–36.0)
MCHC: 31.4 g/dL (ref 30.0–36.0)
MCV: 86.6 fL (ref 80.0–100.0)
MCV: 90.8 fL (ref 80.0–100.0)
Platelets: 224 10*3/uL (ref 150–400)
Platelets: 227 10*3/uL (ref 150–400)
RBC: 4.03 MIL/uL — ABNORMAL LOW (ref 4.22–5.81)
RBC: 4.14 MIL/uL — ABNORMAL LOW (ref 4.22–5.81)
RDW: 17.2 % — ABNORMAL HIGH (ref 11.5–15.5)
RDW: 17.5 % — ABNORMAL HIGH (ref 11.5–15.5)
WBC: 22.1 10*3/uL — ABNORMAL HIGH (ref 4.0–10.5)
WBC: 23.6 10*3/uL — ABNORMAL HIGH (ref 4.0–10.5)
nRBC: 0.3 % — ABNORMAL HIGH (ref 0.0–0.2)
nRBC: 0.6 % — ABNORMAL HIGH (ref 0.0–0.2)

## 2023-03-29 LAB — HIV ANTIBODY (ROUTINE TESTING W REFLEX): HIV Screen 4th Generation wRfx: NONREACTIVE

## 2023-03-29 LAB — GLUCOSE, CAPILLARY
Glucose-Capillary: 140 mg/dL — ABNORMAL HIGH (ref 70–99)
Glucose-Capillary: 145 mg/dL — ABNORMAL HIGH (ref 70–99)
Glucose-Capillary: 149 mg/dL — ABNORMAL HIGH (ref 70–99)
Glucose-Capillary: 51 mg/dL — ABNORMAL LOW (ref 70–99)
Glucose-Capillary: 69 mg/dL — ABNORMAL LOW (ref 70–99)
Glucose-Capillary: 70 mg/dL (ref 70–99)
Glucose-Capillary: 79 mg/dL (ref 70–99)

## 2023-03-29 LAB — CREATININE, SERUM
Creatinine, Ser: 10.11 mg/dL — ABNORMAL HIGH (ref 0.61–1.24)
GFR, Estimated: 6 mL/min — ABNORMAL LOW (ref >60)

## 2023-03-29 LAB — LACTIC ACID, PLASMA
Lactic Acid, Venous: >9 mmol/L (ref 0.5–1.9)
Lactic Acid, Venous: >9 mmol/L (ref 0.5–1.9)
Lactic Acid, Venous: >9 mmol/L (ref 0.5–1.9)
Lactic Acid, Venous: >9 mmol/L (ref 0.5–1.9)
Lactic Acid, Venous: 6.7 mmol/L (ref 0.5–1.9)

## 2023-03-29 LAB — I-STAT VENOUS BLOOD GAS, ED
Acid-base deficit: 5 mmol/L — ABNORMAL HIGH (ref 0.0–2.0)
Bicarbonate: 21.4 mmol/L (ref 20.0–28.0)
Calcium, Ion: 0.73 mmol/L — CL (ref 1.15–1.40)
HCT: 38 % — ABNORMAL LOW (ref 39.0–52.0)
Hemoglobin: 12.9 g/dL — ABNORMAL LOW (ref 13.0–17.0)
O2 Saturation: 82 %
Potassium: 6.4 mmol/L (ref 3.5–5.1)
Sodium: 134 mmol/L — ABNORMAL LOW (ref 135–145)
TCO2: 23 mmol/L (ref 22–32)
pCO2, Ven: 43.1 mmHg — ABNORMAL LOW (ref 44–60)
pH, Ven: 7.304 (ref 7.25–7.43)
pO2, Ven: 52 mmHg — ABNORMAL HIGH (ref 32–45)

## 2023-03-29 LAB — APTT: aPTT: 69 seconds — ABNORMAL HIGH (ref 24–36)

## 2023-03-29 LAB — SARS CORONAVIRUS 2 BY RT PCR: SARS Coronavirus 2 by RT PCR: NEGATIVE

## 2023-03-29 LAB — I-STAT CG4 LACTIC ACID, ED: Lactic Acid, Venous: 11.2 mmol/L (ref 0.5–1.9)

## 2023-03-29 LAB — TROPONIN I (HIGH SENSITIVITY)
Troponin I (High Sensitivity): 962 ng/L (ref <18)
Troponin I (High Sensitivity): 872 ng/L (ref <18)
Troponin I (High Sensitivity): 1171 ng/L (ref <18)
Troponin I (High Sensitivity): 1425 ng/L (ref <18)
Troponin I (High Sensitivity): 1143 ng/L (ref <18)

## 2023-03-29 LAB — HEMOGLOBIN A1C
Hgb A1c MFr Bld: 5.7 % — ABNORMAL HIGH (ref 4.8–5.6)
Mean Plasma Glucose: 116.89 mg/dL

## 2023-03-29 LAB — BRAIN NATRIURETIC PEPTIDE: B Natriuretic Peptide: 2670.6 pg/mL — ABNORMAL HIGH (ref 0.0–100.0)

## 2023-03-29 LAB — PHOSPHORUS: Phosphorus: 9.8 mg/dL — ABNORMAL HIGH (ref 2.5–4.6)

## 2023-03-29 LAB — LIPASE, BLOOD: Lipase: 41 U/L (ref 11–51)

## 2023-03-29 LAB — MRSA NEXT GEN BY PCR, NASAL: MRSA by PCR Next Gen: NOT DETECTED

## 2023-03-29 LAB — MAGNESIUM
Magnesium: 2.6 mg/dL — ABNORMAL HIGH (ref 1.7–2.4)
Magnesium: 2.9 mg/dL — ABNORMAL HIGH (ref 1.7–2.4)

## 2023-03-29 LAB — ETHANOL: Alcohol, Ethyl (B): <10 mg/dL (ref <10)

## 2023-03-29 LAB — HEPARIN LEVEL (UNFRACTIONATED): Heparin Unfractionated: 0.25 IU/mL — ABNORMAL LOW (ref 0.30–0.70)

## 2023-03-29 LAB — AMYLASE: Amylase: 71 U/L (ref 28–100)

## 2023-03-29 LAB — PROCALCITONIN: Procalcitonin: 47.41 ng/mL

## 2023-03-29 MED ORDER — PRISMASOL BGK 0/2.5 32-2.5 MEQ/L EC SOLN
Status: DC
  Filled 2023-03-29 (×10): qty 5000

## 2023-03-29 MED ORDER — SODIUM CHLORIDE 0.9 % IV SOLN
2.0000 g | Freq: Two times a day (BID) | INTRAVENOUS | Status: AC
  Administered 2023-03-30 – 2023-04-07 (×18): 2 g via INTRAVENOUS
  Filled 2023-03-29 (×18): qty 12.5

## 2023-03-29 MED ORDER — SODIUM CHLORIDE 0.9 % IV SOLN
2.0000 g | Freq: Once | INTRAVENOUS | Status: AC
  Administered 2023-03-29: 2 g via INTRAVENOUS
  Filled 2023-03-29: qty 12.5

## 2023-03-29 MED ORDER — DEXMEDETOMIDINE HCL IN NACL 400 MCG/100ML IV SOLN: 0.0000 ug/kg/h | INTRAVENOUS | Status: DC

## 2023-03-29 MED ORDER — CALCIUM GLUCONATE 10 % IV SOLN
1.0000 g | Freq: Once | INTRAVENOUS | Status: AC
  Administered 2023-03-29: 1 g via INTRAVENOUS
  Filled 2023-03-29: qty 10

## 2023-03-29 MED ORDER — FENTANYL 2500MCG IN NS 250ML (10MCG/ML) PREMIX INFUSION
50.0000 ug/h | INTRAVENOUS | Status: DC
  Administered 2023-03-29: 100 ug/h via INTRAVENOUS
  Administered 2023-03-30: 125 ug/h via INTRAVENOUS
  Administered 2023-03-30 – 2023-03-31 (×2): 200 ug/h via INTRAVENOUS
  Administered 2023-04-01: 150 ug/h via INTRAVENOUS
  Administered 2023-04-01: 100 ug/h via INTRAVENOUS
  Administered 2023-04-02: 50 ug/h via INTRAVENOUS
  Filled 2023-03-29 (×7): qty 250

## 2023-03-29 MED ORDER — METRONIDAZOLE 500 MG/100ML IV SOLN
500.0000 mg | Freq: Once | INTRAVENOUS | Status: AC
  Administered 2023-03-29: 500 mg via INTRAVENOUS
  Filled 2023-03-29: qty 100

## 2023-03-29 MED ORDER — SODIUM BICARBONATE 8.4 % IV SOLN
50.0000 meq | Freq: Once | INTRAVENOUS | Status: AC
  Administered 2023-03-29: 50 meq via INTRAVENOUS

## 2023-03-29 MED ORDER — HYDROCORTISONE SOD SUC (PF) 100 MG IJ SOLR
100.0000 mg | Freq: Three times a day (TID) | INTRAMUSCULAR | Status: DC
  Administered 2023-03-29 – 2023-03-31 (×5): 100 mg via INTRAVENOUS
  Filled 2023-03-29 (×5): qty 2

## 2023-03-29 MED ORDER — INSULIN ASPART 100 UNIT/ML IJ SOLN: 0.0000 [IU] | INTRAMUSCULAR | Status: DC

## 2023-03-29 MED ORDER — FENTANYL CITRATE PF 50 MCG/ML IJ SOSY
50.0000 ug | PREFILLED_SYRINGE | Freq: Once | INTRAMUSCULAR | Status: AC
  Administered 2023-03-29: 50 ug via INTRAVENOUS

## 2023-03-29 MED ORDER — CALCIUM GLUCONATE-NACL 1-0.675 GM/50ML-% IV SOLN
INTRAVENOUS | Status: AC
  Filled 2023-03-29: qty 50

## 2023-03-29 MED ORDER — ORAL CARE MOUTH RINSE
15.0000 mL | OROMUCOSAL | Status: DC
  Administered 2023-03-29 – 2023-04-04 (×60): 15 mL via OROMUCOSAL

## 2023-03-29 MED ORDER — CALCITRIOL 0.5 MCG PO CAPS: 0.7500 ug | ORAL_CAPSULE | ORAL | Status: DC

## 2023-03-29 MED ORDER — POLYETHYLENE GLYCOL 3350 17 G PO PACK: 17.0000 g | PACK | Freq: Every day | ORAL | Status: DC

## 2023-03-29 MED ORDER — PERFLUTREN LIPID MICROSPHERE
1.0000 mL | INTRAVENOUS | Status: AC | PRN
  Administered 2023-03-29: 2 mL via INTRAVENOUS

## 2023-03-29 MED ORDER — MIDAZOLAM HCL 2 MG/2ML IJ SOLN
INTRAMUSCULAR | Status: AC
  Filled 2023-03-29: qty 2

## 2023-03-29 MED ORDER — HEPARIN BOLUS VIA INFUSION
6000.0000 [IU] | Freq: Once | INTRAVENOUS | Status: AC
  Administered 2023-03-29: 6000 [IU] via INTRAVENOUS
  Filled 2023-03-29: qty 6000

## 2023-03-29 MED ORDER — SODIUM BICARBONATE 8.4 % IV SOLN
INTRAVENOUS | Status: AC
  Filled 2023-03-29: qty 50

## 2023-03-29 MED ORDER — POLYETHYLENE GLYCOL 3350 17 G PO PACK
17.0000 g | PACK | Freq: Every day | ORAL | Status: DC
  Filled 2023-03-29: qty 1

## 2023-03-29 MED ORDER — VANCOMYCIN HCL 2000 MG/400ML IV SOLN
2000.0000 mg | Freq: Once | INTRAVENOUS | Status: AC
  Administered 2023-03-29: 2000 mg via INTRAVENOUS
  Filled 2023-03-29: qty 400

## 2023-03-29 MED ORDER — SODIUM CHLORIDE 0.9 % IV SOLN: 1.0000 g | INTRAVENOUS | Status: DC

## 2023-03-29 MED ORDER — ETOMIDATE 2 MG/ML IV SOLN: 10.0000 mg | Freq: Once | INTRAVENOUS | Status: AC

## 2023-03-29 MED ORDER — AMIODARONE LOAD VIA INFUSION
150.0000 mg | Freq: Once | INTRAVENOUS | Status: AC
  Administered 2023-03-29: 150 mg via INTRAVENOUS
  Filled 2023-03-29: qty 83.34

## 2023-03-29 MED ORDER — CALCITRIOL 1 MCG/ML PO SOLN: 0.7500 ug | ORAL | Status: DC

## 2023-03-29 MED ORDER — POLYETHYLENE GLYCOL 3350 17 G PO PACK: 17.0000 g | PACK | Freq: Every day | ORAL | Status: DC | PRN

## 2023-03-29 MED ORDER — HEPARIN SODIUM (PORCINE) 1000 UNIT/ML IJ SOLN
INTRAMUSCULAR | Status: AC
  Filled 2023-03-29: qty 3

## 2023-03-29 MED ORDER — LACTATED RINGERS IV BOLUS
500.0000 mL | Freq: Once | INTRAVENOUS | Status: AC
  Administered 2023-03-29: 500 mL via INTRAVENOUS

## 2023-03-29 MED ORDER — ALBUTEROL SULFATE (2.5 MG/3ML) 0.083% IN NEBU
10.0000 mg | INHALATION_SOLUTION | Freq: Once | RESPIRATORY_TRACT | Status: DC
  Filled 2023-03-29: qty 12

## 2023-03-29 MED ORDER — NOREPINEPHRINE 16 MG/250ML-% IV SOLN
0.0000 ug/min | INTRAVENOUS | Status: DC
  Administered 2023-03-29: 14 ug/min via INTRAVENOUS
  Administered 2023-03-31: 3 ug/min via INTRAVENOUS
  Administered 2023-04-02: 7 ug/min via INTRAVENOUS
  Administered 2023-04-02: 5 ug/min via INTRAVENOUS
  Administered 2023-04-03: 15 ug/min via INTRAVENOUS
  Administered 2023-04-04: 11 ug/min via INTRAVENOUS
  Administered 2023-04-05: 26 ug/min via INTRAVENOUS
  Administered 2023-04-05: 40 ug/min via INTRAVENOUS
  Administered 2023-04-05: 18 ug/min via INTRAVENOUS
  Administered 2023-04-06: 30 ug/min via INTRAVENOUS
  Administered 2023-04-06: 22 ug/min via INTRAVENOUS
  Administered 2023-04-07: 12 ug/min via INTRAVENOUS
  Administered 2023-04-08: 27 ug/min via INTRAVENOUS
  Administered 2023-04-08 – 2023-04-09 (×2): 28 ug/min via INTRAVENOUS
  Administered 2023-04-09: 40 ug/min via INTRAVENOUS
  Administered 2023-04-09: 35 ug/min via INTRAVENOUS
  Filled 2023-03-29 (×15): qty 250

## 2023-03-29 MED ORDER — CALCIUM CHLORIDE 10 % IV SOLN
1.0000 g | Freq: Once | INTRAVENOUS | Status: AC
  Administered 2023-03-29: 1 g via INTRAVENOUS

## 2023-03-29 MED ORDER — HEPARIN SODIUM (PORCINE) 5000 UNIT/ML IJ SOLN
5000.0000 [IU] | Freq: Three times a day (TID) | INTRAMUSCULAR | Status: DC
  Administered 2023-03-29: 5000 [IU] via SUBCUTANEOUS
  Filled 2023-03-29: qty 1

## 2023-03-29 MED ORDER — ETOMIDATE 2 MG/ML IV SOLN
INTRAVENOUS | Status: AC
  Administered 2023-03-29: 10 mg via INTRAVENOUS
  Filled 2023-03-29: qty 10

## 2023-03-29 MED ORDER — AMIODARONE HCL IN DEXTROSE 360-4.14 MG/200ML-% IV SOLN: 30.0000 mg/h | INTRAVENOUS | Status: DC

## 2023-03-29 MED ORDER — DEXTROSE 50 % IV SOLN: 12.5000 g | INTRAVENOUS | Status: AC

## 2023-03-29 MED ORDER — CINACALCET HCL 30 MG PO TABS
150.0000 mg | ORAL_TABLET | ORAL | Status: DC
  Administered 2023-03-29: 150 mg via ORAL
  Filled 2023-03-29: qty 5

## 2023-03-29 MED ORDER — VANCOMYCIN HCL IN DEXTROSE 1-5 GM/200ML-% IV SOLN: 1000.0000 mg | INTRAVENOUS | Status: DC

## 2023-03-29 MED ORDER — DEXTROSE 50 % IV SOLN
INTRAVENOUS | Status: AC
  Administered 2023-03-29: 50 mL
  Filled 2023-03-29: qty 50

## 2023-03-29 MED ORDER — NOREPINEPHRINE 4 MG/250ML-% IV SOLN
0.0000 ug/min | INTRAVENOUS | Status: DC
  Administered 2023-03-29: 2 ug/min via INTRAVENOUS
  Administered 2023-03-29: 30 ug/min via INTRAVENOUS
  Administered 2023-03-29: 15 ug/min via INTRAVENOUS
  Filled 2023-03-29 (×3): qty 250

## 2023-03-29 MED ORDER — DEXTROSE 50 % IV SOLN
1.0000 | Freq: Once | INTRAVENOUS | Status: AC
  Administered 2023-03-29: 50 mL via INTRAVENOUS
  Filled 2023-03-29: qty 50

## 2023-03-29 MED ORDER — SODIUM ZIRCONIUM CYCLOSILICATE 10 G PO PACK
10.0000 g | PACK | Freq: Once | ORAL | Status: AC
  Administered 2023-03-29: 10 g via ORAL
  Filled 2023-03-29: qty 1

## 2023-03-29 MED ORDER — HEPARIN (PORCINE) 25000 UT/250ML-% IV SOLN
2100.0000 [IU]/h | INTRAVENOUS | Status: DC
  Administered 2023-03-29: 1600 [IU]/h via INTRAVENOUS
  Administered 2023-03-30: 1650 [IU]/h via INTRAVENOUS
  Administered 2023-03-30 – 2023-03-31 (×3): 1700 [IU]/h via INTRAVENOUS
  Administered 2023-04-01: 1900 [IU]/h via INTRAVENOUS
  Administered 2023-04-03: 1700 [IU]/h via INTRAVENOUS
  Administered 2023-04-03 – 2023-04-04 (×2): 2100 [IU]/h via INTRAVENOUS
  Filled 2023-03-29 (×11): qty 250

## 2023-03-29 MED ORDER — POLYETHYLENE GLYCOL 3350 17 G PO PACK
17.0000 g | PACK | Freq: Every day | ORAL | Status: DC
  Administered 2023-03-29: 17 g via ORAL
  Filled 2023-03-29: qty 1

## 2023-03-29 MED ORDER — HEPARIN SODIUM (PORCINE) 1000 UNIT/ML DIALYSIS
1000.0000 [IU] | INTRAMUSCULAR | Status: DC | PRN
  Administered 2023-04-01 – 2023-04-04 (×2): 2400 [IU] via INTRAVENOUS_CENTRAL
  Filled 2023-03-29: qty 4
  Filled 2023-03-29 (×2): qty 6
  Filled 2023-03-29: qty 2
  Filled 2023-03-29: qty 5
  Filled 2023-03-29: qty 6

## 2023-03-29 MED ORDER — CHLORHEXIDINE GLUCONATE CLOTH 2 % EX PADS
6.0000 | MEDICATED_PAD | Freq: Every day | CUTANEOUS | Status: DC
  Administered 2023-03-29 – 2023-04-09 (×11): 6 via TOPICAL

## 2023-03-29 MED ORDER — AMIODARONE HCL IN DEXTROSE 360-4.14 MG/200ML-% IV SOLN
60.0000 mg/h | INTRAVENOUS | Status: DC
  Administered 2023-03-29: 60 mg/h via INTRAVENOUS
  Filled 2023-03-29: qty 200

## 2023-03-29 MED ORDER — PRISMASOL BGK 0/2.5 32-2.5 MEQ/L EC SOLN
Status: DC
  Filled 2023-03-29 (×3): qty 5000

## 2023-03-29 MED ORDER — DEXTROSE 50 % IV SOLN
INTRAVENOUS | Status: AC
  Administered 2023-03-29: 12.5 g via INTRAVENOUS
  Filled 2023-03-29: qty 50

## 2023-03-29 MED ORDER — DOCUSATE SODIUM 100 MG PO CAPS: 100.0000 mg | ORAL_CAPSULE | Freq: Two times a day (BID) | ORAL | Status: DC | PRN

## 2023-03-29 MED ORDER — FENTANYL BOLUS VIA INFUSION
50.0000 ug | INTRAVENOUS | Status: DC | PRN
  Administered 2023-03-29: 50 ug via INTRAVENOUS
  Administered 2023-03-29: 100 ug via INTRAVENOUS
  Administered 2023-03-30: 50 ug via INTRAVENOUS
  Administered 2023-03-30: 100 ug via INTRAVENOUS
  Administered 2023-03-30: 50 ug via INTRAVENOUS
  Administered 2023-03-31 (×4): 100 ug via INTRAVENOUS
  Administered 2023-04-01: 50 ug via INTRAVENOUS
  Administered 2023-04-01 – 2023-04-02 (×3): 100 ug via INTRAVENOUS
  Administered 2023-04-02: 50 ug via INTRAVENOUS
  Administered 2023-04-02: 100 ug via INTRAVENOUS

## 2023-03-29 MED ORDER — ORAL CARE MOUTH RINSE: 15.0000 mL | OROMUCOSAL | Status: DC | PRN

## 2023-03-29 MED ORDER — FAMOTIDINE 20 MG PO TABS
20.0000 mg | ORAL_TABLET | Freq: Every day | ORAL | Status: DC
  Administered 2023-03-29: 20 mg via ORAL
  Filled 2023-03-29: qty 1

## 2023-03-29 MED ORDER — MIDAZOLAM HCL 2 MG/2ML IJ SOLN
INTRAMUSCULAR | Status: AC
  Administered 2023-03-29: 2 mg
  Filled 2023-03-29: qty 2

## 2023-03-29 MED ORDER — CALCITRIOL 0.5 MCG PO CAPS
0.7500 ug | ORAL_CAPSULE | ORAL | Status: DC
  Administered 2023-03-29: 0.75 ug via ORAL
  Filled 2023-03-29: qty 1

## 2023-03-29 MED ORDER — DOCUSATE SODIUM 50 MG/5ML PO LIQD
100.0000 mg | Freq: Two times a day (BID) | ORAL | Status: DC | PRN
  Administered 2023-03-29: 100 mg

## 2023-03-29 MED ORDER — DEXTROSE 10 % IV SOLN: INTRAVENOUS | Status: DC

## 2023-03-29 MED ORDER — DOCUSATE SODIUM 50 MG/5ML PO LIQD
100.0000 mg | Freq: Two times a day (BID) | ORAL | Status: DC
  Filled 2023-03-29: qty 10

## 2023-03-29 MED ORDER — VASOPRESSIN 20 UNITS/100 ML INFUSION FOR SHOCK
0.0400 [IU]/min | INTRAVENOUS | Status: DC
  Administered 2023-03-29 – 2023-04-09 (×31): 0.04 [IU]/min via INTRAVENOUS
  Filled 2023-03-29 (×31): qty 100

## 2023-03-29 MED ORDER — PANTOPRAZOLE SODIUM 40 MG IV SOLR
40.0000 mg | Freq: Every day | INTRAVENOUS | Status: DC
  Administered 2023-03-29 – 2023-04-07 (×10): 40 mg via INTRAVENOUS
  Filled 2023-03-29 (×9): qty 10

## 2023-03-29 MED ORDER — MIDAZOLAM HCL 2 MG/2ML IJ SOLN
1.0000 mg | INTRAMUSCULAR | Status: DC | PRN
  Administered 2023-03-29 – 2023-04-04 (×6): 2 mg via INTRAVENOUS
  Filled 2023-03-29 (×7): qty 2

## 2023-03-29 MED ORDER — DOCUSATE SODIUM 100 MG PO CAPS: 100.0000 mg | ORAL_CAPSULE | Freq: Two times a day (BID) | ORAL | Status: DC

## 2023-03-29 MED ORDER — VANCOMYCIN HCL 1500 MG/300ML IV SOLN
1500.0000 mg | INTRAVENOUS | Status: DC
  Administered 2023-03-30 – 2023-03-31 (×2): 1500 mg via INTRAVENOUS
  Filled 2023-03-29 (×2): qty 300

## 2023-03-29 MED ORDER — DOCUSATE SODIUM 50 MG/5ML PO LIQD: 100.0000 mg | Freq: Two times a day (BID) | ORAL | Status: DC

## 2023-03-29 NOTE — Consult Note (Signed)
CONSULTATION NOTE   Patient Name: Timothy Palmer Date of Encounter: 03/29/2023 Cardiologist: Little Ishikawa, MD Electrophysiologist: None Advanced Heart Failure: None   Chief Complaint   Shock  Patient Profile   57 yo male with history of MSSA bacterial endocarditis, s/p MV repair, mild aortic valve stenosis, ESRD on HD, PAD and history of septic emboli, presents with myalgias, fatigue, and poor po intake, found to be in shock  HPI   Timothy Palmer is a 57 y.o. male who is being seen today for the evaluation of shock at the request of Dr. Jearld Fenton . This is a complex 57 year old male with a history of MSSA endocarditis with septic emboli, splenic infarction and a mycotic aneurysm causing PAD and the need for left popliteal bypass.  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  He was last seen in follow-up in 2020 by Dr. Bjorn Pippin.  He was noted to have some postoperative A-fib.  At that time he was having progressive dyspnea on exertion.  Repeat echo was recommended, but never performed.  He was seen in 2021 for nonhealing wound over left AV fistula and underwent surgical revision of this by vascular surgery.  He now presents with about a week of myalgia, fatigue and poor p.o. intake.  He had diarrhea and presented with shock with a lactate of greater than 9 and was started on Levophed.  Troponin was elevated at 962, with subsequent troponin of 872.  An echocardiogram was performed urgently in the ER.  I have reviewed these results which showed normal LVEF but moderate RV systolic dysfunction with severe RV enlargement and severe pulmonary hypertension.  There is also severe aortic stenosis which appears calcific.  Aortic vegetation cannot be excluded.  There is evidence of prior mitral valve repair without an obvious mitral valve vegetation, no  mitral stenosis and trivial mitral regurgitation.  Cardiology is asked to evaluate given elevated troponin and the possibility of cardiogenic versus septic shock.  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 The aneurysm measures 1.4 x 1.1 cm which is  significantly larger than the 4.5 mm parent vessel.    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    Past Surgical History:  Procedure Laterality Date   AV FISTULA PLACEMENT Left ?2005   forearm    BASCILIC VEIN TRANSPOSITION Left 12/27/2015   Procedure: FIRST STAGE BASILIC VEIN TRANSPOSITION;  Surgeon: Fransisco Hertz, MD;  Location: [facility] OR;  Service: Vascular;  Laterality: Left;   BASCILIC VEIN TRANSPOSITION Left 02/12/2016   Procedure: SECOND STAGE BASILIC  VEIN TRANSPOSITION;  Surgeon: Fransisco Hertz, MD;  Location: [facility] OR;  Service: Vascular;  Laterality: Left;   EMBOLECTOMY Left 03/25/2014   Procedure: EMBOLECTOMY left popliteal;  Surgeon: Larina Earthly, MD;  Location: [facility] OR;  Service: Vascular;  Laterality: Left;   FEMORAL-POPLITEAL BYPASS GRAFT Left 03/25/2014   Procedure: Left Femoral- Below Knee Popliteal Bypass Graft;  Surgeon: Larina Earthly, MD;  Location: [facility] OR;  Service: Vascular;  Laterality: Left;   INTRAOPERATIVE TRANSESOPHAGEAL ECHOCARDIOGRAM N/A 04/05/2014   Procedure: INTRAOPERATIVE TRANSESOPHAGEAL ECHOCARDIOGRAM;  Surgeon: Purcell Nails, MD;  Location: [facility] OR;  Service: Open Heart Surgery;  Laterality: N/A;   LEFT AND RIGHT HEART CATHETERIZATION WITH CORONARY ANGIOGRAM N/A 03/28/2014   Procedure: LEFT AND RIGHT HEART CATHETERIZATION WITH CORONARY ANGIOGRAM;  Surgeon: Laurey Morale, MD;  Location: [facility] CATH LAB;  Service: Cardiovascular;  Laterality: N/A;   MITRAL VALVE REPAIR Right 04/05/2014   Procedure: MINIMALLY INVASIVE MITRAL VALVE REPAIR (MVR);  Surgeon: Purcell Nails, MD;  Location: [facility] OR;  Service: Open Heart Surgery;  Laterality: Right;   MITRAL VALVE REPLACEMENT Right 04/05/2014   Procedure: Bring back MINIMALLY INVASIVE MITRAL VALVE (MV) REPLACEMENT - Reexploration for bleeding;  Surgeon: Purcell Nails, MD;  Location: [facility] OR;  Service: Open Heart Surgery;  Laterality: Right;   PARATHYROIDECTOMY N/A 03/22/2016   Procedure: PARATHYROIDECTOMY AUTOTRANSPLANT;  Surgeon: Darnell Level, MD;  Location[facility] OR;  Service: General;  Laterality: N/A;   PATCH ANGIOPLASTY Left 07/23/2013   Procedure: PATCH ANGIOPLASTY- LEFT RADIOCEPHALIC ARTERIOVENOUS FISTULA;  Surgeon: Chuck Hint, MD;  Location: [facility] OR;  Service: Vascular;  Laterality: Left;   REVISON OF ARTERIOVENOUS FISTULA Left 11/04/2019   Procedure: REVISON OF LEFT ARM ARTERIOVENOUS FISTULA;  Surgeon: Larina Earthly, MD;  Location: [facility] OR;  Service: Vascular;  Laterality: Left;    SHUNTOGRAM Left November 04, 2011   TEE WITHOUT CARDIOVERSION N/A 03/24/2014   Procedure: TRANSESOPHAGEAL ECHOCARDIOGRAM (TEE);  Surgeon: Laurey Morale, MD;  Location: [facility] ENDOSCOPY;  Service: Cardiovascular;  Laterality: N/A;   TEE WITHOUT CARDIOVERSION N/A 04/12/2015   Procedure: TRANSESOPHAGEAL ECHOCARDIOGRAM (TEE);  Surgeon: Laurey Morale, MD;  Location: [facility] ENDOSCOPY;  Service: Cardiovascular;  Laterality: N/A;   TEE WITHOUT CARDIOVERSION N/A 08/18/2015   Procedure: TRANSESOPHAGEAL ECHOCARDIOGRAM (TEE);  Surgeon: Laurey Morale, MD;  Location: [facility] ENDOSCOPY;  Service: Cardiovascular;  Laterality: N/A;    FAMHx   Family History  Problem Relation Age of Onset   Diabetes Mother    Hypertension Mother    Hypertension Father    Hypertension Sister    Hypertension Brother     SOCHx    reports that he has never smoked. He has never used smokeless tobacco. He reports current alcohol use. He reports current drug use. Frequency: 7.00 times per week. Drug: Marijuana.  Outpatient Medications   No current facility-administered medications on file prior to encounter.   Current Outpatient Medications on File Prior to Encounter  Medication Sig Dispense Refill   multivitamin (RENA-VIT) TABS tablet Take 1 tablet by mouth at bedtime. (Patient taking differently: Take 1 tablet by  mouth daily.) 30 tablet 0   aspirin 81 MG tablet Take 81 mg by mouth daily.     HYDROcodone-acetaminophen (NORCO) 5-325 MG tablet Take 1 tablet by mouth every 6 (six) hours as needed for moderate pain. 10 tablet 0   lisinopril (PRINIVIL,ZESTRIL) 20 MG tablet Take 20 mg by mouth daily.     montelukast (SINGULAIR) 10 MG tablet Take 10 mg by mouth daily.     Nutritional Supplements (FEEDING SUPPLEMENT, NEPRO CARB STEADY,) LIQD Take 237 mLs by mouth 2 (two) times daily between meals. (Patient not taking: Reported on 11/04/2019)     sevelamer carbonate (RENVELA) 800 MG tablet Take 4,000 mg by mouth 3 (three) times daily with  meals.       Inpatient Medications    Scheduled Meds:  Chlorhexidine Gluconate Cloth  6 each Topical Daily   famotidine  20 mg Oral Daily   heparin injection (subcutaneous)  5,000 Units Subcutaneous Q8H   insulin aspart  0-6 Units Subcutaneous Q4H    Continuous Infusions:  [START ON 03/30/2023] ceFEPime (MAXIPIME) IV     dextrose Stopped (03/29/23 1057)   norepinephrine (LEVOPHED) Adult infusion 2 mcg/min (03/29/23 1053)   [START ON 04/01/2023] vancomycin      PRN Meds: docusate sodium, perflutren lipid microspheres (DEFINITY) IV suspension, polyethylene glycol   ALLERGIES   No Known Allergies   ROS   Pertinent items noted in HPI and remainder of comprehensive ROS otherwise negative.  Vitals   Vitals:   03/29/23 1200 03/29/23 1218 03/29/23 1254 03/29/23 1300  BP: 97/70  (!) 87/61 (!) 82/55  Pulse:   61 63  Resp: 13  (!) 28 (!) 37  Temp:  (!) 97.4 F (36.3 C)    TempSrc:  Oral    SpO2: 96%  97% 97%  Weight:   114.1 kg   Height:   5\' 11"  (1.803 m)    No intake or output data in the 24 hours ending 03/29/23 1317 Filed Weights   03/29/23 0736 03/29/23 1254  Weight: 108.9 kg 114.1 kg    Physical Exam   General appearance: alert, mild distress, and morbidly obese Neck: no carotid bruit, no JVD, and thyroid not enlarged, symmetric, no tenderness/mass/nodules Lungs: diminished breath sounds bibasilar Heart: regular rate and rhythm and systolic murmur: late systolic 3/6, crescendo at 2nd right intercostal space Abdomen: soft, non-tender; bowel sounds normal; no masses,  no organomegaly and obese Extremities: extremities normal, atraumatic, no cyanosis or edema Pulses: 2+ and symmetric Skin: Skin color, texture, turgor normal. No rashes or lesions Neurologic: Grossly normal Psych: Pleasant  Labs   Results for orders placed or performed during the hospital encounter of 03/29/23 (from the past 48 hour(s))  CBG monitoring, ED     Status: Abnormal   Collection  Time: 03/29/23  7:43 AM  Result Value Ref Range   Glucose-Capillary 15 (LL) 70 - 99 mg/dL    Comment: Glucose reference range applies only to samples taken after fasting for at least 8 hours.   Comment 1 Notify RN   SARS Coronavirus 2 by RT PCR (hospital order, performed in [facility] hospital lab) *cepheid single result test* Anterior Nasal Swab     Status: None   Collection Time: 03/29/23  7:56 AM   Specimen: Anterior Nasal Swab  Result Value Ref Range   SARS Coronavirus 2 by RT PCR NEGATIVE NEGATIVE    Comment: Performed at [facility] Lab, [address] [place], [place] [address]  CBC with Differential  Status: Abnormal   Collection Time: 03/29/23  8:00 AM  Result Value Ref Range   WBC 22.6 (H) 4.0 - 10.5 K/uL   RBC 4.32 4.22 - 5.81 MIL/uL   Hemoglobin 12.2 (L) 13.0 - 17.0 g/dL   HCT 16.1 (L) 09.6 - 04.5 %   MCV 88.9 80.0 - 100.0 fL   MCH 28.2 26.0 - 34.0 pg   MCHC 31.8 30.0 - 36.0 g/dL   RDW 40.9 (H) 81.1 - 91.4 %   Platelets 236 150 - 400 K/uL   nRBC 0.3 (H) 0.0 - 0.2 %   Neutrophils Relative % 88 %   Neutro Abs 20.0 (H) 1.7 - 7.7 K/uL   Lymphocytes Relative 4 %   Lymphs Abs 1.0 0.7 - 4.0 K/uL   Monocytes Relative 5 %   Monocytes Absolute 1.1 (H) 0.1 - 1.0 K/uL   Eosinophils Relative 1 %   Eosinophils Absolute 0.2 0.0 - 0.5 K/uL   Basophils Relative 1 %   Basophils Absolute 0.1 0.0 - 0.1 K/uL   Immature Granulocytes 1 %   Abs Immature Granulocytes 0.25 (H) 0.00 - 0.07 K/uL    Comment: Performed at [facility] Lab, [address] [place], [place] [address]  Troponin I (High Sensitivity)     Status: Abnormal   Collection Time: 03/29/23  8:00 AM  Result Value Ref Range   Troponin I (High Sensitivity) 962 (HH) <18 ng/L    Comment: CRITICAL RESULT CALLED TO Jess Barters, RN, READ BACK BY AND VERIFIED WITH Malissa Hippo [phone] ON 9.21.2024 (NOTE) Elevated high sensitivity troponin I (hsTnI) values and significant  changes across serial measurements may suggest ACS  but many other  chronic and acute conditions are known to elevate hsTnI results.  Refer to the "Links" section for chest pain algorithms and additional  guidance. Performed at [facility] Lab, [address], [place], [place] [address]   I-Stat CG4 Lactic Acid, ED     Status: Abnormal   Collection Time: 03/29/23  8:32 AM  Result Value Ref Range   Lactic Acid, Venous 11.2 (HH) 0.5 - 1.9 mmol/L   Comment NOTIFIED PHYSICIAN   CBG monitoring, ED     Status: Abnormal   Collection Time: 03/29/23  8:44 AM  Result Value Ref Range   Glucose-Capillary 55 (L) 70 - 99 mg/dL    Comment: Glucose reference range applies only to samples taken after fasting for at least 8 hours.  Lactic acid, plasma     Status: Abnormal   Collection Time: 03/29/23  8:48 AM  Result Value Ref Range   Lactic Acid, Venous >9.0 (HH) 0.5 - 1.9 mmol/L    Comment: CRITICAL RESULT CALLED TO Jess Barters, RN, READ BACK BY AND VERIFIED WITH Malissa Hippo [phone] ON 9.21.2024 Performed at [facility] Lab, [address], [place], [place] [address]   CBG monitoring, ED     Status: Abnormal   Collection Time: 03/29/23  9:09 AM  Result Value Ref Range   Glucose-Capillary 227 (H) 70 - 99 mg/dL    Comment: Glucose reference range applies only to samples taken after fasting for at least 8 hours.  Ethanol     Status: None   Collection Time: 03/29/23  9:27 AM  Result Value Ref Range   Alcohol, Ethyl (B) <10 <10 mg/dL    Comment: (NOTE) Lowest detectable limit for serum alcohol is 10 mg/dL.  For medical purposes only. Performed at [facility] Lab, [address], [place], [place] [address]   Troponin I (High Sensitivity)     Status: Abnormal   Collection Time: 03/29/23  9:27 AM  Result Value Ref Range   Troponin I (High Sensitivity) 872 (HH) <18 ng/L    Comment: CRITICAL RESULT CALLED TO, READ BACK BY AND VERIFIED WITH Normand Sloop RN AT [phone] BY D LONG (NOTE) Elevated high sensitivity troponin I (hsTnI) values and  significant  changes across serial measurements may suggest ACS but many other  chronic and acute conditions are known to elevate hsTnI results.  Refer to the "Links" section for chest pain algorithms and additional  guidance. Performed at [facility] Lab, [address] [place], [place] [address]   CBG monitoring, ED     Status: None   Collection Time: 03/29/23  9:54 AM  Result Value Ref Range   Glucose-Capillary 75 70 - 99 mg/dL    Comment: Glucose reference range applies only to samples taken after fasting for at least 8 hours.  Lactic acid, plasma     Status: Abnormal   Collection Time: 03/29/23  9:55 AM  Result Value Ref Range   Lactic Acid, Venous >9.0 (HH) 0.5 - 1.9 mmol/L    Comment: CRITICAL VALUE NOTED. VALUE IS CONSISTENT WITH PREVIOUSLY REPORTED/CALLED VALUE Performed at [facility] Lab, [address] [place], [place] [address]   Comprehensive metabolic panel     Status: Abnormal   Collection Time: 03/29/23 10:13 AM  Result Value Ref Range   Sodium 135 135 - 145 mmol/L   Potassium 5.9 (H) 3.5 - 5.1 mmol/L   Chloride 89 (L) 98 - 111 mmol/L   CO2 17 (L) 22 - 32 mmol/L   Glucose, Bld 64 (L) 70 - 99 mg/dL    Comment: Glucose reference range applies only to samples taken after fasting for at least 8 hours.   BUN 70 (H) 6 - 20 mg/dL   Creatinine, Ser 69.62 (H) 0.61 - 1.24 mg/dL   Calcium 7.0 (L) 8.9 - 10.3 mg/dL   Total Protein 7.6 6.5 - 8.1 g/dL   Albumin 2.2 (L) 3.5 - 5.0 g/dL   AST 952 (H) 15 - 41 U/L   ALT 96 (H) 0 - 44 U/L   Alkaline Phosphatase 165 (H) 38 - 126 U/L   Total Bilirubin 8.6 (H) 0.3 - 1.2 mg/dL   GFR, Estimated 6 (L) >60 mL/min    Comment: (NOTE) Calculated using the CKD-EPI Creatinine Equation (2021)    Anion gap 29 (H) 5 - 15    Comment: ELECTROLYTES REPEATED TO VERIFY Performed at [facility] Lab, [address] [place], [place] [address]   Magnesium     Status: Abnormal   Collection Time: 03/29/23 10:13 AM  Result Value Ref  Range   Magnesium 2.6 (H) 1.7 - 2.4 mg/dL    Comment: Performed at [facility] Lab, [address], [place], [place] [address]  CBG monitoring, ED     Status: None   Collection Time: 03/29/23 11:00 AM  Result Value Ref Range   Glucose-Capillary 72 70 - 99 mg/dL    Comment: Glucose reference range applies only to samples taken after fasting for at least 8 hours.  I-Stat venous blood gas, ([facility] ED, [facility], [facility])     Status: Abnormal   Collection Time: 03/29/23 11:18 AM  Result Value Ref Range   pH, Ven 7.304 7.25 - 7.43   pCO2, Ven 43.1 (L) 44 - 60 mmHg  pO2, Ven 52 (H) 32 - 45 mmHg   Bicarbonate 21.4 20.0 - 28.0 mmol/L   TCO2 23 22 - 32 mmol/L   O2 Saturation 82 %   Acid-base deficit 5.0 (H) 0.0 - 2.0 mmol/L   Sodium 134 (L) 135 - 145 mmol/L   Potassium 6.4 (HH) 3.5 - 5.1 mmol/L   Calcium, Ion 0.73 (LL) 1.15 - 1.40 mmol/L   HCT 38.0 (L) 39.0 - 52.0 %   Hemoglobin 12.9 (L) 13.0 - 17.0 g/dL   Sample type VENOUS    Comment NOTIFIED PHYSICIAN   I-stat chem 8, ED (not at [facility], [facility] or [facility])     Status: Abnormal   Collection Time: 03/29/23 11:19 AM  Result Value Ref Range   Sodium 133 (L) 135 - 145 mmol/L   Potassium 6.3 (HH) 3.5 - 5.1 mmol/L   Chloride 97 (L) 98 - 111 mmol/L   BUN 80 (H) 6 - 20 mg/dL   Creatinine, Ser 16.10 (H) 0.61 - 1.24 mg/dL   Glucose, Bld 68 (L) 70 - 99 mg/dL    Comment: Glucose reference range applies only to samples taken after fasting for at least 8 hours.   Calcium, Ion 0.70 (LL) 1.15 - 1.40 mmol/L   TCO2 21 (L) 22 - 32 mmol/L   Hemoglobin 12.9 (L) 13.0 - 17.0 g/dL   HCT 96.0 (L) 45.4 - 09.8 %   Comment NOTIFIED PHYSICIAN   CBC     Status: Abnormal   Collection Time: 03/29/23 11:58 AM  Result Value Ref Range   WBC 22.1 (H) 4.0 - 10.5 K/uL   RBC 4.03 (L) 4.22 - 5.81 MIL/uL   Hemoglobin 11.6 (L) 13.0 - 17.0 g/dL   HCT 11.9 (L) 14.7 - 82.9 %   MCV 86.6 80.0 - 100.0 fL   MCH 28.8 26.0 - 34.0 pg   MCHC 33.2 30.0 - 36.0 g/dL   RDW 56.2 (H) 13.0 - 86.5 %    Platelets 224 150 - 400 K/uL    Comment: REPEATED TO VERIFY   nRBC 0.3 (H) 0.0 - 0.2 %    Comment: Performed at [facility] Lab, [address] [place], [place] [address]  Protime-INR     Status: Abnormal   Collection Time: 03/29/23 11:58 AM  Result Value Ref Range   Prothrombin Time 20.8 (H) 11.4 - 15.2 seconds   INR 1.8 (H) 0.8 - 1.2    Comment: (NOTE) INR goal varies based on device and disease states. Performed at [facility] Lab, [address] [place], [place] [address]   Hemoglobin A1c     Status: Abnormal   Collection Time: 03/29/23 11:58 AM  Result Value Ref Range   Hgb A1c MFr Bld 5.7 (H) 4.8 - 5.6 %    Comment: (NOTE) Pre diabetes:          5.7%-6.4%  Diabetes:              >6.4%  Glycemic control for   <7.0% adults with diabetes    Mean Plasma Glucose 116.89 mg/dL    Comment: Performed at [facility] Lab, [address], [place], [place] [address]    ECG   Sinus rhythm at 74, bifascicular block, PVC's - Personally Reviewed  Telemetry   Sinus rhythm - Personally Reviewed  Radiology   ECHOCARDIOGRAM COMPLETE  Result Date: 03/29/2023    ECHOCARDIOGRAM REPORT   Patient Name:   Timothy Palmer [facility] Date of Exam: 03/29/2023 Medical Rec #:  [id number]      Height:       71.0 in Accession #:    [id number]     Weight:       240.0 lb Date of Birth:  30-Jan-1966     BSA:          2.278 m Patient Age:    56 years       BP:           90/63 mmHg Patient Gender: M              HR:           63 bpm. Exam Location:  Inpatient Procedure: 2D Echo, Color Doppler and Cardiac Doppler Indications:    Shock  History:        Patient has prior history of Echocardiogram examinations, most                 recent 08/19/2015. Risk Factors:Hypertension. 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                  Mitral Valve: 28 mm Sorin Mitral Memo 3D prosthetic annuloplasty                 ring valve is present in the mitral position.   Sonographer:    Aron Baba Referring Phys: [id number] SARAH F GROCE IMPRESSIONS  1. Left ventricular ejection fraction, by estimation, is 60 to 65%. The left ventricle has normal function. The left ventricle demonstrates regional wall motion abnormalities (see scoring diagram/findings for description). There is mild left ventricular  hypertrophy. Left ventricular diastolic parameters are consistent with Grade I diastolic dysfunction (impaired relaxation). Elevated left ventricular end-diastolic pressure. There is the interventricular septum is flattened in systole and diastole, consistent with right ventricular pressure and volume overload.  2. Right ventricular systolic function is moderately reduced. The right ventricular size is severely enlarged. There is severely elevated pulmonary artery systolic pressure. The estimated right ventricular systolic pressure is 73.3 mmHg.  3. Left atrial size was mildly dilated.  4. Right atrial size was moderately dilated.  5. The mitral valve has been repaired/replaced. Trivial mitral valve regurgitation. No evidence of mitral stenosis. The mean mitral valve gradient is 4.0 mmHg with average heart rate of 68 bpm. There is a 28 mm Sorin Mitral Memo 3D prosthetic annuloplasty ring present in the mitral position.  6. The tricuspid valve is abnormal.  7. Cannot exclude aortic valve vegetation. The aortic valve is calcified. There is severe calcifcation of the aortic valve. There is moderate thickening of the aortic valve. Aortic valve regurgitation is not visualized. Severe aortic valve stenosis. Aortic valve area, by VTI measures 0.53 cm. Aortic valve mean gradient measures 32.7 mmHg. Aortic valve Vmax measures 4.00 m/s. Peak gradient 64 mmHg, DI is 0.23.  8. The inferior vena cava is normal in size with <50% respiratory variability, suggesting right atrial pressure of 8 mmHg. Comparison(s): Changes from prior study are noted. 08/19/2015: LVEF 55-60%, MV s/p repair, no stenosis, mild  AS. Conclusion(s)/Recommendation(s): Critical findings reported to Dr. Delton Coombes and acknowledged at 1:15 pm. FINDINGS  Left Ventricle: Left ventricular ejection fraction, by estimation, is 60 to 65%. The left ventricle has normal function. The left ventricle demonstrates regional wall motion abnormalities. The left ventricular internal cavity size was small. There is mild left ventricular hypertrophy. The interventricular septum  is flattened in systole and diastole, consistent with right ventricular pressure and volume overload. Left ventricular diastolic parameters are consistent with Grade I diastolic dysfunction (impaired relaxation). Elevated left ventricular end-diastolic pressure. Right Ventricle: The right ventricular size is severely enlarged. No increase in right ventricular wall thickness. Right ventricular systolic function is moderately reduced. There is severely elevated pulmonary artery systolic pressure. The tricuspid regurgitant velocity is 4.04 m/s, and with an assumed right atrial pressure of 8 mmHg, the estimated right ventricular systolic pressure is 73.3 mmHg. Left Atrium: Left atrial size was mildly dilated. Right Atrium: Right atrial size was moderately dilated. Pericardium: There is no evidence of pericardial effusion. Mitral Valve: The mitral valve has been repaired/replaced. Trivial mitral valve regurgitation. There is a 28 mm Sorin Mitral Memo 3D prosthetic annuloplasty ring present in the mitral position. No evidence of mitral valve stenosis. MV peak gradient, 6.2 mmHg. The mean mitral valve gradient is 4.0 mmHg with average heart rate of 68 bpm. Tricuspid Valve: The tricuspid valve is abnormal. Tricuspid valve regurgitation is mild. Aortic Valve: Cannot exclude aortic valve vegetation. The aortic valve is calcified. There is severe calcifcation of the aortic valve. There is moderate thickening of the aortic valve. Aortic valve regurgitation is not visualized. Severe aortic stenosis is  present. Aortic valve mean gradient measures 32.7 mmHg. Aortic valve peak gradient measures 64.1 mmHg. Aortic valve area, by VTI measures 0.53 cm. Pulmonic Valve: The pulmonic valve was normal in structure. Pulmonic valve regurgitation is not visualized. Aorta: The aortic root and ascending aorta are structurally normal, with no evidence of dilitation. Venous: The inferior vena cava is normal in size with less than 50% respiratory variability, suggesting right atrial pressure of 8 mmHg. IAS/Shunts: No atrial level shunt detected by color flow Doppler.  LEFT VENTRICLE PLAX 2D LVIDd:         5.80 cm   Diastology LVIDs:         5.10 cm   LV e' medial:    4.24 cm/s LV PW:         1.40 cm   LV E/e' medial:  29.0 LV IVS:        0.80 cm   LV e' lateral:   4.57 cm/s LVOT diam:     1.70 cm   LV E/e' lateral: 26.9 LV SV:         31 LV SV Index:   14 LVOT Area:     2.27 cm  RIGHT VENTRICLE RV S prime:     8.70 cm/s TAPSE (M-mode): 2.1 cm LEFT ATRIUM             Index        RIGHT ATRIUM           Index LA diam:        3.80 cm 1.67 cm/m   RA Area:     25.60 cm LA Vol (A2C):   33.6 ml 14.75 ml/m  RA Volume:   89.80 ml  39.41 ml/m LA Vol (A4C):   55.6 ml 24.40 ml/m LA Biplane Vol: 45.9 ml 20.15 ml/m  AORTIC VALVE AV Area (Vmax):    0.54 cm AV Area (Vmean):   0.48 cm AV Area (VTI):     0.53 cm AV Vmax:           400.33 cm/s AV Vmean:          253.333 cm/s AV VTI:            0.592 m  AV Peak Grad:      64.1 mmHg AV Mean Grad:      32.7 mmHg LVOT Vmax:         94.70 cm/s LVOT Vmean:        53.400 cm/s LVOT VTI:          0.138 m LVOT/AV VTI ratio: 0.23  AORTA Ao Root diam: 3.00 cm MITRAL VALVE                TRICUSPID VALVE MV Area (PHT): 3.12 cm     TR Peak grad:   65.3 mmHg MV Area VTI:   0.60 cm     TR Vmax:        404.00 cm/s MV Peak grad:  6.2 mmHg MV Mean grad:  4.0 mmHg     SHUNTS MV Vmax:       1.25 m/s     Systemic VTI:  0.14 m MV Vmean:      94.5 cm/s    Systemic Diam: 1.70 cm MV Decel Time: 243 msec MR Peak  grad: 2.3 mmHg MR Vmax:      76.00 cm/s MV E velocity: 123.00 cm/s MV A velocity: 116.00 cm/s MV E/A ratio:  1.06 Zoila Shutter MD Electronically signed by Zoila Shutter MD Signature Date/Time: 03/29/2023/1:16:01 PM    Final    CT ABDOMEN PELVIS WO CONTRAST  Result Date: 03/29/2023 CLINICAL DATA:  Acute abdominal pain.  Diarrhea.  Septic shock. EXAM: CT ABDOMEN AND PELVIS WITHOUT CONTRAST TECHNIQUE: Multidetector CT imaging of the abdomen and pelvis was performed following the standard protocol without IV contrast. RADIATION DOSE REDUCTION: This exam was performed according to the departmental dose-optimization program which includes automated exposure control, adjustment of the mA and/or kV according to patient size and/or use of iterative reconstruction technique. COMPARISON:  04/06/2015 FINDINGS: Lower chest: No acute findings. Hepatobiliary: No mass visualized on this unenhanced exam. Gallstones are seen, however there is no evidence of cholecystitis or biliary dilatation. Pancreas: No mass or inflammatory process visualized on this unenhanced exam. Spleen:  Within normal limits in size. Adrenals/Urinary tract: Enlarged kidneys are again seen bilaterally with diffuse involvement by innumerable cysts, many showing mural calcification. Renal masses cannot definitely be excluded on this unenhanced exam. No evidence of ureteral calculi or hydronephrosis. Urinary bladder is nearly completely empty. Stomach/Bowel: Generalized gaseous distention of stomach and nondependent bowel loops is noted, without transition point. This is most consistent with ileus. No evidence of focal inflammatory process or abscess. Small amount of free fluid noted in right abdomen and pelvis, with diffuse mesenteric and body wall edema. Vascular/Lymphatic: No pathologically enlarged lymph nodes identified. No evidence of abdominal aortic aneurysm. Reproductive:  No mass or other significant abnormality. Other:  None. Musculoskeletal:  No  suspicious bone lesions identified. IMPRESSION: Adynamic ileus pattern. No evidence of bowel obstruction or focal inflammatory process. Polycystic kidney disease. Small amount of ascites, with mild diffuse mesenteric and body wall edema. Cholelithiasis. No radiographic evidence of cholecystitis. Electronically Signed   By: Danae Orleans M.D.   On: 03/29/2023 09:35   DG Chest Portable 1 View  Result Date: 03/29/2023 CLINICAL DATA:  57 year old male with history of cough. EXAM: PORTABLE CHEST 1 VIEW COMPARISON:  Chest x-ray 03/19/2015. FINDINGS: Lung volumes are low. Elevation of the right hemidiaphragm, worsened compared to the prior study from 2016. No definite consolidative airspace disease. Mild diffuse interstitial prominence and peribronchial cuffing, increased compared to the remote prior examination. No pleural effusions. No pneumothorax. No evidence  of pulmonary edema. Heart size is mildly enlarged. Upper mediastinal contours are within normal limits. Atherosclerotic calcifications are noted in the thoracic aorta. IMPRESSION: 1. Mild diffuse peribronchial cuffing and interstitial prominence, which may suggest an acute bronchitis, although similar findings were evident in 2016 so some element of chronic bronchitis is suspected as well. 2. Severe elevation of the right hemidiaphragm worsened compared to the prior study suggesting right hemidiaphragm paralysis. 3. Aortic atherosclerosis. Electronically Signed   By: Trudie Reed M.D.   On: 03/29/2023 08:18    Cardiac Studies   See echo above  Impression   Principal Problem:   Shock (HCC) Active Problems:   S/P minimally invasive mitral valve repair   Paroxysmal atrial fibrillation (HCC)   ESRD on dialysis (HCC)   Hypotension   Lactic acidosis   Aortic stenosis   Recommendation   Mr. Roup presented with myalgias, fatigue and poor p.o. intake and was found to be in shock on admission with high lactate and elevated troponin.  The  troponin has been fairly stable.  A bedside point-of-care echo performed by the EDP suggested cardiomyopathy however formal echo with contrast indicates normal LV function however severe RV enlargement with moderate RV systolic dysfunction and severe pulmonary hypertension.  Additionally there is severe aortic stenosis.  Prior mitral valve repair appears intact without any significant mitral stenosis or regurgitation.  I could not exclude aortic valve vegetation.  Their concern is with his severe pulmonary hypertension as to whether this could represent pulmonary embolus.  He is not significantly hypoxic or tachycardic.  Given his elevated troponins however, would advise systemic heparinization.  He was apparently taken off of anticoagulation in the past due to bleeding diatheses.  I discussed the case with Dr. Corliss Blacker.  Will empirically heparinize with plan for CT imaging to rule out PE.  Would cover for possible pathogens for endocarditis.  Would recommend a TEE likely next week once he is further stabilized.  Thanks for the consultation. Cardiology will follow closely with you.  CRITICAL CARE TIME: I have spent a total of 45 minutes with patient reviewing hospital notes, telemetry, EKGs, labs and examining the patient as well as establishing an assessment and plan that was discussed with the patient.  > 50% of time was spent in direct patient care. The patient is critically ill with multi-organ system failure and requires high complexity decision making for assessment and support, frequent evaluation and titration of therapies, application of advanced monitoring technologies and extensive interpretation of multiple databases.   Length of Stay:  LOS: 0 days   Chrystie Nose, MD, [facility], FACP  [place]  [facility]  Medical Director of the Advanced Lipid Disorders &  Cardiovascular Risk Reduction Clinic Diplomate of the American Board of Clinical Lipidology Attending Cardiologist  Direct Dial:  [phone]  Fax: [phone]  Website:  [email or web address] Nicely Timothy Palmer 03/29/2023, 1:17 PM

## 2023-03-29 NOTE — Progress Notes (Signed)
While talking to the pt's sister at the bedside, the patient became unresponsive and went into wide complex tachycardia/Vtach. Code blue initiated. Pt did not lose pulse, but had weak and thready pulses. This RN bagged the patient until RT arrived. Pt placed on the defibrillator monitor. MD at bedside. Labs drawn (See results review) Norepinephrine increased d/t severe hypotension (see MAR). Vasopressin started per order.

## 2023-03-29 NOTE — Procedures (Signed)
Central Venous Catheter Insertion Procedure Note  SHAWNDALE KENNER  [id number]  Jan 01, 1966  Date:03/29/23  Time:6:21 PM   Provider Performing:Jary Louvier Blenda Bridegroom   Procedure: Insertion of Non-tunneled Central Venous Catheter(36556)with US guidance (56213)    Indication(s) Hemodialysis  Consent Risks of the procedure as well as the alternatives and risks of each were explained to the patient and/or caregiver.  Consent for the procedure was obtained and is signed in the bedside chart  Anesthesia Topical only with 1% lidocaine   Timeout Verified patient identification, verified procedure, site/side was marked, verified correct patient position, special equipment/implants available, medications/allergies/relevant history reviewed, required imaging and test results available.  Sterile Technique Maximal sterile technique was not able to be achieved due to emergent nature of procedure.  Procedure Description Area of catheter insertion was cleaned with chlorhexidine and draped in sterile fashion.   With real-time ultrasound guidance a HD catheter was placed into the right internal jugular vein.  Nonpulsatile blood flow and easy flushing noted in all ports.  The catheter was sutured in place and sterile dressing applied.  Complications/Tolerance None; patient tolerated the procedure well. Chest X-ray is ordered to verify placement for internal jugular or subclavian cannulation.  Chest x-ray is not ordered for femoral cannulation.  EBL Minimal  Specimen(s) None   Bevelyn Ngo, MSN, AGACNP-BC [facility] Pulmonary/Critical Care Medicine See Amion for personal pager PCCM on call pager [phone]  03/29/2023 6:22 PM

## 2023-03-29 NOTE — Progress Notes (Signed)
03/29/2023 Wide complex rhythm, agonal respirations.  Did not lose pulses. Pushed calcium, bicarb and intubated for airway protection. To start CRRT.  Myrla Halsted MD PCCM

## 2023-03-29 NOTE — Progress Notes (Signed)
ED Pharmacy Antibiotic Sign Off An antibiotic consult was received from an ED provider for vancomycin and cefepime per pharmacy dosing for sepsis. A chart review was completed to assess appropriateness.   The following one time order(s) were placed:  Cefepime 2g x 1  Vancomycin 2000mg  x 1   Further antibiotic and/or antibiotic pharmacy consults should be ordered by the admitting provider if indicated.   Thank you for allowing pharmacy to be a part of this patient's care.   Marja Kays, [facility]  Clinical Pharmacist 03/29/23 9:41 AM

## 2023-03-29 NOTE — Progress Notes (Signed)
Chaplain responded to a code blue page for Pt. Chaplain found Pt's sister outside the room, crying. Chaplain stayed with Pt's sister. Pt's sister requested prayer and Chaplain prayed with her. Intervention was successful. Pt's sister remains in the room, waiting for more family members to visit Pt.   03/29/23 2011  Spiritual Encounters  Type of Visit Initial  Care provided to: [facility] partners present during encounter Nurse  Referral source Code page  Reason for visit Code  OnCall Visit Yes   Oneida Alar Chaplain Intern

## 2023-03-29 NOTE — ED Notes (Signed)
Pt sister, Britta Mccreedy, would like to be contacted once transferred to a room.

## 2023-03-29 NOTE — ED Notes (Signed)
RN sent requisition order to lab to add respiratory panel

## 2023-03-29 NOTE — ED Triage Notes (Addendum)
Pt BIB EMS due to hypotension. Pt was getting ready for dialysis and had a fall. Pt denies hitting head. Pt hypoglycemic in 30's and BP 70/40. Pt was supposed to have dialysis this morning, last full tx was Thursday. Axox4. EMS gave glucagon

## 2023-03-29 NOTE — Progress Notes (Signed)
ANTICOAGULATION CONSULT NOTE  Pharmacy Consult for Heparin Indication: ACS vs VTE  No Known Allergies  Patient Measurements: Height: 5\' 11"  (180.3 cm) Weight: 114.1 kg (251 lb 8.7 oz) IBW/kg (Calculated) : 75.3 Heparin Dosing Weight: 100 kg  Vital Signs: Temp: 98.4 F (36.9 C) (09/21 2320) Temp Source: Axillary (09/21 2320) BP: 113/73 (09/21 2000) Pulse Rate: 70 (09/21 2330)  Labs: Recent Labs    03/29/23 0800 03/29/23 0927 03/29/23 1119 03/29/23 1158 03/29/23 1350 03/29/23 1912 03/29/23 2120 03/29/23 2214  HGB 12.2*   < > 12.9* 11.6*  --  11.8* 12.9*  --   HCT 38.4*   < > 38.0* 34.9*  --  37.6* 38.0*  --   PLT 236  --   --  224  --  227  --   --   APTT  --   --   --   --   --   --   --  69*  LABPROT  --   --   --  20.8*  --   --   --   --   INR  --   --   --  1.8*  --   --   --   --   HEPARINUNFRC  --   --   --   --   --   --   --  0.25*  CREATININE  --    < > 10.60* 10.11*  --  10.23*  --   --   TROPONINIHS 962*   < >  --   --  1,171* 1,425*  --  1,143*   < > = values in this interval not displayed.    Estimated Creatinine Clearance: 10.5 mL/min (A) (by C-G formula based on SCr of 10.23 mg/dL (H)).   Assessment: 27 YOM presenting with shock. Significant cardiac history including MSSA endocarditis, mitral regurgitation, aortic stenosis. Also hx of ESRD and DVT. Presented with elevated troponins and ECHO showed moderate RV dysfunction and RV enlargement. Pharmacy consulted to dose heparin to rule out PE with potential ACS. No AC PTA  Bilirubin 8.6 - will check both aPTT and heparin levels to assess if correlating aPTT 69 - therapeutic on low end HL 0.25 - potentially falsely suppressed w Tbili elevated  Goal of Therapy:  Heparin level 0.3-0.7 units/ml aPTT 66-102 seconds Monitor platelets by anticoagulation protocol: Yes   Plan:  Given tBili elevation will use aPTT to guide dosing for now  Increase heparin slightly to 1650 units/hr to maintain therapeutic  level Check heparin level/aPTT in AM Daily CBC and heparin level/aPTT  Calton Dach, PharmD, BCCCP Clinical Pharmacist 03/29/2023 11:48 PM

## 2023-03-29 NOTE — H&P (Signed)
Pulmonary and Critical Care H&P  Please refer to attested progress note 9/21 12:37 pm  Levy Pupa, MD, PhD 03/29/2023, 1:05 PM [place] [facility] and Critical [facility] [phone] or if no answer before 7:00PM call [phone] For any issues after 7:00PM please call eLink [phone]

## 2023-03-29 NOTE — Procedures (Signed)
I was present at this CRRT session, have reviewed the session and made  appropriate changes Vinson Moselle MD  [facility] 03/29/2023, 9:06 PM

## 2023-03-29 NOTE — ED Notes (Signed)
Critical Care NP Sarah at bedside.

## 2023-03-29 NOTE — ED Notes (Signed)
RN notified phlebotomy to get second Blood Culture

## 2023-03-29 NOTE — ED Notes (Signed)
Pt placed on 2L nasal canula for comfort. EDP at bedside and agreed.

## 2023-03-29 NOTE — Progress Notes (Addendum)
Echo completed>> shows normal LVEF but moderate RV systolic dysfunction with severe RV enlargement and severe pulmonary hypertension.  There is also severe aortic stenosis which appears calcific.  Aortic vegetation cannot be excluded.  There is evidence of prior mitral valve repair without an obvious mitral valve vegetation, no mitral stenosis and trivial mitral regurgitation.   Cardiology consulted, plan is for systemic heparin and CTA Chest to Rule out PE.   Pt. Is also complaining of nausea. Will add anti-emetic . Troponin 1,171 from 872. Will start systemic heparin Will need HD today, can consider CT PE once he has been dialyzed.   Bevelyn Ngo, MSN, AGACNP-BC [place] Pulmonary/Critical Care Medicine See Amion for personal pager PCCM on call pager [phone]

## 2023-03-29 NOTE — Procedures (Signed)
Arterial Catheter Insertion Procedure Note  Timothy Palmer  [id number]  Apr 15, 1966  Date:03/29/23  Time:8:11 PM    Provider Performing: Lidia Collum    Procedure: Insertion of Arterial Line (46962) with US guidance (95284)   Indication(s) Blood pressure monitoring and/or need for frequent ABGs  Consent Unable to obtain consent due to emergent nature of procedure.  Anesthesia None   Time Out Verified patient identification, verified procedure, site/side was marked, verified correct patient position, special equipment/implants available, medications/allergies/relevant history reviewed, required imaging and test results available.   Sterile Technique Maximal sterile technique including full sterile barrier drape, hand hygiene, sterile gown, sterile gloves, mask, hair covering, sterile ultrasound probe cover (if used).   Procedure Description Area of catheter insertion was cleaned with chlorhexidine and draped in sterile fashion. With real-time ultrasound guidance an arterial catheter was placed into the right femoral artery.  Appropriate arterial tracings confirmed on monitor.     Complications/Tolerance None; patient tolerated the procedure well.   EBL Minimal   Specimen(s) None  [name] [place] [facility] 03/29/2023, 8:12 PM  Please see [email or web address] for pager details.  From 7A-7P if no response, please call [phone]. After hours, please call ELink [phone].

## 2023-03-29 NOTE — ED Notes (Signed)
Pt sister is at the bedside

## 2023-03-29 NOTE — Progress Notes (Signed)
Date and time results received: 03/29/23 2018 (use smartphrase ".now" to insert current time)  Test: K Critical Value: 6.9  Name of Provider Notified: elink  Orders Received? Or Actions Taken?:  see order profile

## 2023-03-29 NOTE — Progress Notes (Addendum)
eLink Physician-Brief Progress Note Patient Name: Timothy Palmer DOB: [birth year] MRN: [id number]   Date of Service  03/29/2023  HPI/Events of Note  Camera: Went into brief Vt ach without loss of pulse from prolonged qtc, ESRD-hyperkalemia. Received, stat hyperkalemia Rx. Intubated for airway protection.   CxR Right HD cath in place. KUB: Non specific bowel gas pattern. No air fluid level ( done before intubation).   On Levo at 20 and heparin drip. Tachycardic. To go on CVVHD.  Shock. On antibiotics.   eICU Interventions  Follow CxR, labs, ABG, Mag levels.  Follow LA /K levels.      Intervention Category Major Interventions: Respiratory failure - evaluation and management;Arrhythmia - evaluation and management  Ranee Gosselin 03/29/2023, 7:14 PM  20:23 K at 6.9, rising again.   Elevated AGMA. On Ventilator.  Troponin started to trending down. LA still > 9   Plan: Discussed with bed side RN.starting CVVHD. Follow K/LA /ABG during CVVHD in progress.   20;53 Asking for Versed prn for sedation.  Protocol ordered.   22:36 RT asking for vent orders to epic. Ordered ABG reviewed. Fio2 now at 65%. Wean Vt as tolerated to keep < 7 ml/ibw.   22:38 KUB: Ng in place, ok to use it  23:35 K back at 6.5, down trending on CRRT, Nephrology on board, Discussed with RN.   03:06 Patient is in bigeminy 80-100s. Want to restart amio. Was stopped after being cardioverted because he went into SB.   Troponin stable at 1254, not doubling. Anemia stable > 11 LA 4.8 Mag > 2 Tolerating CRRT, weaning down on pressures.  - sent AM labs and ABG now. - calcium gluconate 1 1 gm  and get Calcium level. - get stat EKG for qtc inetrval.   03:41 EKG reviewed. 1 degree AVB, prolonged qtc . Frequent PAC's. Follow AM labs.  Continue monitoring for now. Will avoid amiodarone , since has qtc prolongation.   06:12 Improving LA, K at 6. Troponin stabilizing.

## 2023-03-29 NOTE — Progress Notes (Addendum)
Pharmacy Antibiotic Note  Timothy Palmer is a 57 y.o. male admitted on 03/29/2023 concern for sepsis.  Pharmacy has been consulted for vancomycin and cefepime dosing.  Vancomycin 2g IV x 1 given  Plan: Vancomycin 1g IV q HD Cefepime 1g IV q 24h Monitor HD schedule, Cx and clinical progression to narrow Vancomycin random level as needed  Addendum: Now to start CRRT, adj the following Cefepime to 2g IV q 12h  Vancomycin 1500 mg IV q 24h  Height: 5\' 11"  (180.3 cm) Weight: 108.9 kg (240 lb) IBW/kg (Calculated) : 75.3  Temp (24hrs), Avg:97 F (36.1 C), Min:96.5 F (35.8 C), Max:97.4 F (36.3 C)  Recent Labs  Lab 03/29/23 0800 03/29/23 0832 03/29/23 0848 03/29/23 0955 03/29/23 1013 03/29/23 1119 03/29/23 1158  WBC 22.6*  --   --   --   --   --  22.1*  CREATININE  --   --   --   --  10.07* 10.60*  --   LATICACIDVEN  --  11.2* >9.0* >9.0*  --   --   --     Estimated Creatinine Clearance: 9.9 mL/min (A) (by C-G formula based on SCr of 10.6 mg/dL (H)).    Allergies  Allergen Reactions   No Known Allergies     Daylene Posey, PharmD, [facility] Clinical Pharmacist ED Pharmacist Phone # [phone] 03/29/2023 12:48 PM

## 2023-03-29 NOTE — ED Notes (Signed)
Patient transported to CT 

## 2023-03-29 NOTE — ED Notes (Signed)
Per critical care NP Sarah recheck pt CBG and pause Dextrose 10%. If pt CBG elevated initiate D10 at 50 mL/hr.

## 2023-03-29 NOTE — ED Notes (Signed)
Echo at bedside

## 2023-03-29 NOTE — Progress Notes (Signed)
While placing HD cath nurse noted left foot was cool to touch. She was able to doppler both DP and pedal pulses. 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  Pt has become more lethargic, we will check an ABG/ VBG now. Once CXR has been done , we will confirm placement of HD cath and start CVVHD.  Abdomen is also more distended. He states it is not tender,and when palpated  it does not appear to bother him. KUB has been ordered.  Lactate remains > than 9 on most recent lab work.  Potassium at last draw is 6.2, he was treated with lokelma earlier today and K did not respond. Hopefully will respond to CVVHD. He has been stable SB in the upper 50's, with what looks like a right BBB. His QTc is prolonged at 0.7  Bevelyn Ngo, MSN, AGACNP-BC [facility] Pulmonary/Critical Care Medicine See Amion for personal pager PCCM on call pager [phone]

## 2023-03-29 NOTE — ED Provider Notes (Signed)
[place] EMERGENCY DEPARTMENT AT [facility]) Provider Note   CSN: [id number] Arrival date & time: 03/29/23  7829     History {Add pertinent medical, surgical, social history, OB history to HPI:1} Chief Complaint  Patient presents with   Hypotension    Timothy Palmer is a 57 y.o. male with PMH as listed below who is BIB EMS due to hypotension. Pt was getting ready for dialysis and had a fall. He was standing up to get ready and answer the door, became very weak and tried to sit down, slid to the ground. Pt denies hitting head.  Is not hurting anywhere from the fall, did not lose consciousness or pass out.  He did not feel lightheaded or dizzy at that time, just extremely generally weak.  He states he has been feeling weak for weeks to months.  He states he has had diarrhea since Tuesday, approximately 9-10 episodes in total, nonbloody and no melena.  He also has had significantly decreased p.o. intake because he has just been feeling bad.  He has had no N/V, abdominal pain, fevers/chills, chest pain, SOB. Does endorse a nonproductive cough.  Pt was supposed to have dialysis this morning, last full tx was Thursday.  He is not sure what his blood pressure normally is during dialysis however this time it was soft in the 90s on Thursday. Pt hypoglycemic in 30's and BP 70/40 with EMS. Received oral glucagon with EMS. On arrival to ED, pressure in 80s systolic and BG 15 mg/dL. Pt awake/alert, joking with nurses, stating he feels weak.  Patient states he does not have a diagnosis of diabetes, do not take insulin or oral glucose medications.   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 The aneurysm measures 1.4 x 1.1 cm which is  significantly larger than the 4.5 mm parent vessel.    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 Authorizing Provider  aspirin 81 MG tablet Take 81 mg by mouth daily.    [provider]  HYDROcodone-acetaminophen (NORCO) 5-325 MG tablet Take 1 tablet by mouth every 6 (six) hours as needed for moderate pain. 11/04/19   Emilie Rutter, PA-C  lisinopril (PRINIVIL,ZESTRIL)  20 MG tablet Take 20 mg by mouth daily.    [provider]  montelukast (SINGULAIR) 10 MG tablet Take 10 mg by mouth daily. 05/28/19   [provider]  multivitamin (RENA-VIT) TABS tablet Take 1 tablet by mouth at bedtime. Patient taking differently: Take 1 tablet by mouth daily.  04/14/15   Hongalgi, Maximino Greenland, MD  Nutritional Supplements (FEEDING SUPPLEMENT, NEPRO CARB STEADY,) LIQD Take 237 mLs by mouth 2 (two) times daily between meals. Patient not taking: Reported on  11/04/2019 04/14/15   Elease Etienne, MD  sevelamer carbonate (RENVELA) 800 MG tablet Take 4,000 mg by mouth 3 (three) times daily with meals.     [provider]      Allergies    No known allergies    Review of Systems   Review of Systems A 10 point review of systems was performed and is negative unless otherwise reported in HPI.  Physical Exam Updated Vital Signs Ht 5\' 11"  (1.803 m)   Wt 108.9 kg   BMI 33.47 kg/m  Physical Exam General: Normal appearing obese male, lying in bed.  HEENT: PERRLA, Sclera anicteric, MMM, trachea midline.  Cardiology: RRR, no murmurs/rubs/gallops.  Resp: Normal respiratory rate and effort. CTAB, no wheezes, rhonchi, crackles.  Abd: Soft, non-tender, non-distended. No rebound tenderness or guarding.  GU: Deferred. MSK: No peripheral edema or signs of trauma. Extremities without deformity or TTP. No cyanosis or clubbing. Skin: warm, dry. Back: No CVA tenderness Neuro: A&Ox4, CNs II-XII grossly intact. MAEs. Sensation grossly intact.  Psych: Normal mood and affect.   ED Results / Procedures / Treatments   Labs (all labs ordered are listed, but only abnormal results are displayed) Labs Reviewed  CBG MONITORING, ED - Abnormal; Notable for the following components:      Result Value   Glucose-Capillary 15 (*)    All other components within normal limits  SARS CORONAVIRUS 2 BY RT PCR  CULTURE, BLOOD (ROUTINE X 2)  CULTURE, BLOOD (ROUTINE X 2)  CBC WITH DIFFERENTIAL/PLATELET  COMPREHENSIVE METABOLIC PANEL  MAGNESIUM  LACTIC ACID, PLASMA  LACTIC ACID, PLASMA  CBG MONITORING, ED  TROPONIN I (HIGH SENSITIVITY)    EKG None  Radiology No results found.  Procedures Procedures  {Document cardiac monitor, telemetry assessment procedure when appropriate:1}  Medications Ordered in ED Medications  dextrose 50 % solution 50 mL (50 mLs Intravenous Given 03/29/23 0749)  lactated ringers bolus 500 mL (500 mLs Intravenous New Bag/Given  03/29/23 0800)    ED Course/ Medical Decision Making/ A&P                          Medical Decision Making Amount and/or Complexity of Data Reviewed Labs: ordered. Decision-making details documented in ED Course.  Risk Prescription drug management.    This patient presents to the ED for concern of generalized weakness, hypotension, hypoglycemia, this involves an extensive number of treatment options, and is a complaint that carries with it a high risk of complications and morbidity.  I considered the following differential and admission for this acute, potentially life threatening condition.   MDM:    Patient initially with blood pressure in the 80s and blood glucose shockingly 15 mg/dL.  Considered erroneous value as patient is awake and alert however he did have a value of 30 mg/dL with EMS.  He is given juice and crackers while IV access is obtained and is immediately given 1 amp of D50.  Patient  does not take insulin or any oral glucose medications.  Consider medication side effect, illness such as sepsis as patient does have a history of bacteremia and endocarditis, he also has extremely decreased p.o. intake lately.  Will continue to monitor closely.  For patient's hypotension and generalized weakness, consider hypovolemic shock given diarrhea and decreased p.o. intake.  Will give small fluid bolus as patient is a dialysis patient with heart failure and will monitor closely.  Will have low threshold for pressors though it is likely from hypovolemia.  Also consider septic shock with history of bacteremia.  Patient reports no fevers or chills but does have a cough and diarrhea, consider respiratory or intra-abdominal source of infection the patient has no abdominal pain and no abdominal tenderness to palpation.  He has no chest pain to suggest ACS but consider atypical ACS and we will get troponin.  EKG without signs of arrhythmia or ischemia.  Consider heart failure exacerbation or anemia as  well.  He has no focal neurodeficits to suggest an intracranial process, no headache.  Did not have a head strike today and no headache, no indication for head imaging.   Clinical Course as of 03/29/23 0803  Sat Mar 29, 2023  9147 Glucose-Capillary(!!): 15 Given D50, orange juice, crackers. Patient awake and alert, A&Ox4 [HN]    Clinical Course User Index [HN] Loetta Rough, MD    Labs: I Ordered, and personally interpreted labs.  The pertinent results include: Those listed above  Imaging Studies ordered: I ordered imaging studies including chest x-ray I independently visualized and interpreted imaging. I agree with the radiologist interpretation  Additional history obtained from EMS, chart review.   Cardiac Monitoring: The patient was maintained on a cardiac monitor.  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 The aneurysm measures 1.4 x 1.1 cm which is  significantly larger than the 4.5  mm parent vessel.    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     Medicines Meds ordered this encounter  Medications   dextrose 50 % solution 50 mL   lactated ringers bolus 500 mL    I have reviewed the patients home medicines and have made adjustments as needed  Problem List / ED Course: Problem List Items Addressed This Visit   None        {Document critical care time when appropriate:1} {Document review of labs and clinical decision tools ie heart score, Chads2Vasc2 etc:1}  {Document your independent review of radiology images, and any outside records:1} {Document your discussion with family members, caretakers, and with consultants:1} {Document social determinants of health affecting pt's care:1} {Document your decision making why or why not admission, treatments were needed:1}  This note was created using dictation software, which may contain spelling or grammatical errors.

## 2023-03-29 NOTE — Progress Notes (Signed)
Elink following for sepsis protocol. 

## 2023-03-29 NOTE — Progress Notes (Signed)
NAME:  Timothy Palmer, MRN:  [id number], DOB:  [birth year], LOS: 0 ADMISSION DATE:  03/29/2023, CONSULTATION DATE:  03/29/2023 REFERRING MD:  , CHIEF COMPLAINT:  generalized shock ( cardiogenic/ septic) , hypotension, elevated troponin    History of Present Illness:  57 year old male patient with history of ESRD ( T, Th, Sat) , Systolic and diastolic CHF, HTN, Mitral valve repair ( 2015) and replacement 2/2 MSSA  endocarditis( 2016), Mycotic aneurysm, Upper extremity DVT( 2015) and PVD. Presented to the Ed 9/21 with history or 4 days of diarrhea which resolved 9/20, with poor po intake and fatigue with weakness and body aches. In the ED he had Lactate of 11.9, Troponin of 900, bedside echo by ED MD showed decreased EF. Last echo 2016 showed EF of 50-55%. He is not followed by cardiology. He is not on blood thinners as they caused his HD graph to bleed. PCCM were notified by ED MD  and asked to admit the patient and manage care .  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 The aneurysm measures 1.4 x 1.1 cm which is  significantly larger than the 4.5 mm parent vessel.    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   Vegetation on atrial surface of repaired native mitral valve seen on TEE   S/P  minimally invasive mitral valve repair 04/05/2014   Complex valvuloplasty including autologous pericardial patch repair of large perforation of posterior leaflet due to endocarditis with 28 mm Sorin Memo 3D ring annuloplasty via right mini thoracotomy approach   Septic embolism to left lower extremity 03/25/2014   Severe mitral regurgitation 03/23/2014   by TEE   Shortness of breath dyspnea    with exertion   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 Currently on 2 L [place] with sats of 100% HD is due today, 9/21  Objective   Blood pressure (!) 89/65, pulse 77, temperature (!) 96.5 F (35.8 C), temperature source Axillary, resp. rate (!) 23, height 5\' 11"  (1.803 m), weight 108.9 kg, SpO2 95%.       No intake or output data in the 24 hours ending 03/29/23 1058 Filed Weights   03/29/23 0736  Weight: 108.9 kg    Examination: General:  Awake and alert, states he is weak and tired, in NAD HENT: . MM pink and dry, PERRLA, No LAD Lungs:  Bilateral chest excursion, clear bilaterally , very diminished per right  Cardiovascular:  S1, S2, + JVD, SR per tele, + Murmur Abdomen:  Obese, Soft, NT, ND, BS +, Body mass index is 33.47 kg/m.  Extremities:  No obvious deformities, 1+ edema BLE, cool to touch Neuro:  Awake and alert and oriented x 3, MAE x 4, Appropriate GU:  Anuric, ESRD with HD T/Th/Sat  Resolved Hospital Problem list     Assessment & Plan:  Shock, cardiogenic vs sepsis vs both ? Post viral cardiomyopathy Leukocytosis History of Systolic and Diastolic HF, MV repair and replacement  Troponin 962 on admission  Lactate 11.9 on admission  Last Echo 2016 EF 50-55% Not followed by cardiology Not on anticoagulation ( makes HD graft bleed) Plan Stat Echo now Gentle IV  fluids Titrate Levophed as needed to maintain MAP > 65 mm Hg Trend  BNP Follow blood Cx Continue vanc and cefepime as ordered Trend troponin Trend lactate Cardiology consult Follow fever curve and WBC Hold home anti-hypertensive and pain medication  Respiratory viral panel, negative for Covid, but will check for Flu, RSV etc Check amylase and lipase Pro calcitonin  If steroid indicated for post viral endocarditis, per cardiology  ESRD Anuric   T/Th/ Sat HD Last HD 9/19 K of 6.4 on admission  Plan Trend CMET Trend electrolytes and replete as needed.  Lokelma 10 mg x 1 now  Recheck K in 1 hour after Lokelma dosing  Renal consult , will need HD today  If BP too soft will need to consider CVVHD  HTN Plan Hold home antihypertensives for now Hold   Hypoglycemia ? If accurate as may be clamped down 2/2 shock Plan CBG Q 4 SSI Check HGB A1C  Nutrition  Plan Heart healthy diet 1200 cc fluid restriction     Best Practice (right click and "Reselect all SmartList Selections" daily)   Diet/type: Regular consistency (see orders) DVT prophylaxis: other SQ heparin GI prophylaxis: H2B Lines: Peripheral Foley:  N/A Code Status:  full code Last date of multidisciplinary goals of care discussion [Updated at bedside 9/21]  Labs   CBC: Recent Labs  Lab 03/29/23 0800  WBC 22.6*  NEUTROABS 20.0*  HGB 12.2*  HCT 38.4*  MCV 88.9  PLT 236    Basic Metabolic Panel: No results for input(s): "NA", "K", "CL", "CO2", "GLUCOSE", "BUN", "CREATININE", "CALCIUM", "MG", "PHOS" in the last 168 hours. GFR: CrCl cannot be calculated (Patient's most recent lab result is older than the maximum 21 days allowed.). Recent Labs  Lab 03/29/23 0800 03/29/23 0832 03/29/23 0848  WBC 22.6*  --   --   LATICACIDVEN  --  11.2* >9.0*    Liver Function Tests: No results for input(s): "AST", "ALT", "ALKPHOS", "BILITOT", "PROT", "ALBUMIN" in the last 168 hours. No results for input(s):  "LIPASE", "AMYLASE" in the last 168 hours. No results for input(s): "AMMONIA" in the last 168 hours.  ABG    Component Value Date/Time   PHART 7.482 (H) 04/07/2014 0857   PCO2ART 40.4 04/07/2014 0857   PO2ART 133.0 (H) 04/07/2014 0857   HCO3 30.2 (H) 04/07/2014 0857   TCO2 32 11/04/2019 1232   ACIDBASEDEF 1.0 04/06/2014 0524   O2SAT 99.0 04/07/2014 0857     Coagulation Profile: No results for input(s): "INR", "PROTIME" in the last 168 hours.  Cardiac Enzymes: No results for input(s): "CKTOTAL", "CKMB", "CKMBINDEX", "TROPONINI" in the last 168 hours.  HbA1C: Hgb A1c MFr Bld  Date/Time Value Ref Range Status  04/05/2014 04:19 AM 6.0 (H) <5.7 % Final    Comment:    (NOTE)  According to the ADA Clinical Practice Recommendations for 2011, when HbA1c is used as a screening test:  >=6.5%   Diagnostic of Diabetes Mellitus           (if abnormal result is confirmed) 5.7-6.4%   Increased risk of developing Diabetes Mellitus References:Diagnosis and Classification of Diabetes Mellitus,Diabetes Care,2011,34(Suppl 1):S62-S69 and Standards of Medical Care in         Diabetes - 2011,Diabetes Care,2011,34 (Suppl 1):S11-S61.    CBG: Recent Labs  Lab 03/29/23 0743 03/29/23 0844 03/29/23 0909 03/29/23 0954  GLUCAP 15* 55* 227* 75    Review of Systems:    + for weakness, fatigue, generalized body aches  Past Medical History:  He,  has a past medical history of Acute combined systolic and diastolic congestive heart failure (HCC) (03/21/2014), Bacterial endocarditis - MSSA positive blood cultures with mitral valve vegetation, severe mitral regurgitation, and septic embolization (03/21/2014), Chronic diastolic congestive heart failure (HCC), DVT of upper extremity (deep vein thrombosis) (HCC) (03/04/2014), ESRD (end stage renal disease) on dialysis [facility]), Hypertension, 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   Surgical History:   Past Surgical History:  Procedure Laterality Date   AV FISTULA PLACEMENT Left ?2005   forearm    BASCILIC VEIN TRANSPOSITION Left 12/27/2015   Procedure: FIRST STAGE BASILIC VEIN TRANSPOSITION;  Surgeon: Fransisco Hertz, MD;  Location: [facility] OR;  Service: Vascular;  Laterality: Left;   BASCILIC VEIN TRANSPOSITION Left 02/12/2016   Procedure: SECOND STAGE BASILIC VEIN TRANSPOSITION;  Surgeon: Fransisco Hertz, MD;  Location: [facility] OR;  Service: Vascular;  Laterality: Left;   EMBOLECTOMY Left 03/25/2014   Procedure: EMBOLECTOMY left popliteal;  Surgeon: Larina Earthly, MD;  Location: [facility] OR;  Service: Vascular;  Laterality: Left;   FEMORAL-POPLITEAL BYPASS GRAFT Left 03/25/2014   Procedure: Left Femoral- Below Knee Popliteal Bypass Graft;  Surgeon: Larina Earthly, MD;  Location[facility] OR;  Service: Vascular;  Laterality: Left;   INTRAOPERATIVE TRANSESOPHAGEAL ECHOCARDIOGRAM N/A 04/05/2014   Procedure: INTRAOPERATIVE TRANSESOPHAGEAL ECHOCARDIOGRAM;  Surgeon: Purcell Nails, MD;  Location: [facility] OR;  Service: Open Heart Surgery;  Laterality: N/A;   LEFT AND RIGHT HEART CATHETERIZATION WITH CORONARY ANGIOGRAM N/A 03/28/2014   Procedure: LEFT AND RIGHT HEART CATHETERIZATION WITH CORONARY ANGIOGRAM;  Surgeon: Laurey Morale, MD;  Location: [facility] CATH LAB;  Service: Cardiovascular;  Laterality: N/A;   MITRAL VALVE REPAIR Right 04/05/2014   Procedure: MINIMALLY INVASIVE MITRAL VALVE REPAIR (MVR);  Surgeon: Purcell Nails, MD;  Location: [facility] OR;  Service: Open Heart Surgery;  Laterality: Right;   MITRAL VALVE REPLACEMENT Right 04/05/2014   Procedure: Bring back MINIMALLY INVASIVE MITRAL  VALVE (MV) REPLACEMENT - Reexploration for bleeding;  Surgeon: Purcell Nails, MD;  Location: [facility] OR;  Service: Open Heart Surgery;  Laterality: Right;   PARATHYROIDECTOMY N/A 03/22/2016   Procedure: PARATHYROIDECTOMY AUTOTRANSPLANT;  Surgeon: Darnell Level, MD;  Location: [facility] OR;  Service: General;  Laterality: N/A;   PATCH ANGIOPLASTY Left 07/23/2013   Procedure: PATCH ANGIOPLASTY- LEFT RADIOCEPHALIC ARTERIOVENOUS FISTULA;  Surgeon: Chuck Hint, MD;  Location: [facility] OR;  Service: Vascular;  Laterality: Left;   REVISON OF ARTERIOVENOUS FISTULA Left 11/04/2019   Procedure: REVISON OF LEFT ARM ARTERIOVENOUS FISTULA;  Surgeon: Larina Earthly, MD;  Location: [facility] OR;  Service: Vascular;  Laterality: Left;   SHUNTOGRAM Left November 04, 2011   TEE WITHOUT CARDIOVERSION N/A 03/24/2014   Procedure: TRANSESOPHAGEAL ECHOCARDIOGRAM (TEE);  Surgeon: Laurey Morale, MD;  Location: [facility] ENDOSCOPY;  Service: Cardiovascular;  Laterality: N/A;   TEE WITHOUT CARDIOVERSION N/A 04/12/2015   Procedure: TRANSESOPHAGEAL ECHOCARDIOGRAM (TEE);  Surgeon: Laurey Morale, MD;  Location: [facility] ENDOSCOPY;  Service: Cardiovascular;  Laterality: N/A;   TEE WITHOUT CARDIOVERSION N/A 08/18/2015   Procedure: TRANSESOPHAGEAL ECHOCARDIOGRAM (TEE);  Surgeon: Laurey Morale, MD;  Location: [facility] ENDOSCOPY;  Service: Cardiovascular;  Laterality: N/A;     Social History:   reports that he has never smoked. He has never used smokeless tobacco. He reports current alcohol use. He reports current drug use. Frequency: 7.00 times per week. Drug: Marijuana.   Family History:  His family history includes Diabetes in his mother; Hypertension in his brother, father, mother, and sister.   Allergies Allergies  Allergen Reactions   No Known Allergies      Home Medications  Prior to Admission medications   Medication Sig Start Date End Date Taking? Authorizing Provider  aspirin 81 MG tablet Take 81 mg by mouth daily.    [provider]   HYDROcodone-acetaminophen (NORCO) 5-325 MG tablet Take 1 tablet by mouth every 6 (six) hours as needed for moderate pain. 11/04/19   Emilie Rutter, PA-C  lisinopril (PRINIVIL,ZESTRIL) 20 MG tablet Take 20 mg by mouth daily.    [provider]  montelukast (SINGULAIR) 10 MG tablet Take 10 mg by mouth daily. 05/28/19   [provider]  multivitamin (RENA-VIT) TABS tablet Take 1 tablet by mouth at bedtime. Patient taking differently: Take 1 tablet by mouth daily.  04/14/15   Hongalgi, Maximino Greenland, MD  Nutritional Supplements (FEEDING SUPPLEMENT, NEPRO CARB STEADY,) LIQD Take 237 mLs by mouth 2 (two) times daily between meals. Patient not taking: Reported on 11/04/2019 04/14/15   Elease Etienne, MD  sevelamer carbonate (RENVELA) 800 MG tablet Take 4,000 mg by mouth 3 (three) times daily with meals.     [provider]     Critical care time: 55 minutes   Bevelyn Ngo, MSN, AGACNP-BC [facility] Pulmonary/Critical Care Medicine See Amion for personal pager PCCM on call pager [phone]  03/29/2023 12:20 PM

## 2023-03-29 NOTE — Procedures (Signed)
Intubation Procedure Note  Timothy Palmer  [id number]  08/29/1965  Date:03/29/23  Time:7:01 PM   Provider Performing:Dr. Geraldo Pitter under direction of Lorin Glass    Procedure: Intubation (31500)  Indication(s) Respiratory Failure  Consent Unable to obtain consent due to emergent nature of procedure.   Anesthesia None  Time Out Verified patient identification, verified procedure, site/side was marked, verified correct patient position, special equipment/implants available, medications/allergies/relevant history reviewed, required imaging and test results available.   Sterile Technique Usual hand hygeine, masks, and gloves were used   Procedure Description Patient positioned in bed supine.  Sedation given as noted above.  Patient was intubated with endotracheal tube using Glidescope.  View was Grade 1 full glottis .  Number of attempts was 1.  Colorimetric CO2 detector was consistent with tracheal placement.   Complications/Tolerance None; patient tolerated the procedure well. Chest X-ray is ordered to verify placement.   EBL Minimal   Specimen(s) None

## 2023-03-29 NOTE — Progress Notes (Addendum)
ANTICOAGULATION CONSULT NOTE  Pharmacy Consult for Heparin Indication: ACS vs VTE  No Known Allergies  Patient Measurements: Height: 5\' 11"  (180.3 cm) Weight: 114.1 kg (251 lb 8.7 oz) IBW/kg (Calculated) : 75.3 Heparin Dosing Weight: 100 kg  Vital Signs: Temp: 97.4 F (36.3 C) (09/21 1218) Temp Source: Oral (09/21 1218) BP: 110/67 (09/21 1530) Pulse Rate: 56 (09/21 1530)  Labs: Recent Labs    03/29/23 0800 03/29/23 0927 03/29/23 1013 03/29/23 1118 03/29/23 1119 03/29/23 1158 03/29/23 1350  HGB 12.2*  --   --  12.9* 12.9* 11.6*  --   HCT 38.4*  --   --  38.0* 38.0* 34.9*  --   PLT 236  --   --   --   --  224  --   LABPROT  --   --   --   --   --  20.8*  --   INR  --   --   --   --   --  1.8*  --   CREATININE  --   --  10.07*  --  10.60*  --   --   TROPONINIHS 962* 872*  --   --   --   --  1,171*    Estimated Creatinine Clearance: 10.1 mL/min (A) (by C-G formula based on SCr of 10.6 mg/dL (H)).   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 The aneurysm measures 1.4 x 1.1 cm which is  significantly larger than the 4.5 mm parent vessel.    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 Significant cardiac history including MSSA endocarditis, mitral regurgitation, aortic stenosis. Also hx of ESRD and DVT. Presented with elevated troponins and ECHO showed moderate RV dysfunction and RV enlargement. Pharmacy consulted to dose heparin to rule out PE with potential ACS. No AC PTA  Bilirubin 8.6 - will check both aPTT and heparin levels to assess if correlating  Goal of Therapy:  Heparin level 0.3-0.7 units/ml Monitor platelets by anticoagulation protocol: Yes   Plan:  Heparin 6000 units IV once then start heparin 1600 units/hr Check heparin level/aPTT in 6 hours Daily CBC and heparin level/aPTT  Eldridge Scot, PharmD, BCCCP Clinical Pharmacist 03/29/2023, 3:49 PM

## 2023-03-29 NOTE — Consult Note (Signed)
Renal Service Consult Note [name]  Timothy Palmer 03/29/2023 Maree Krabbe, MD Requesting Physician: Dr. Delton Coombes  Reason for Consult: ESRD pt w/  HPI: The patient is a 57 y.o. year-old w/ PMH as below who presented to ED w/ hx of diarrhea x 4 days, poor po intake, body aches and gen'd weakness. In ED lactate 11.9, Bp's low, ^wbc. Last HD was Thursday. Seen by CCM. Given gentle IVF's, blood cx's sent, IV vanc/ cefepime given. Trending lactate. Cardiology consulted due to hx of MV endocarditis w/ MV repair. Echo done showing normal LV but severe RV enlargement w/ moderate RV syst dysfunction and severe pulm HTN. Also there was severe AS. Prior MV appeared intact w/o obvious MS or MR. Aortic valve vegetation could not be excluded. Per cardiology consult there was possibility of PE. Trop's were high. IV heparin was started and pt admitted to ICU. We are asked to see for dialysis.   Pt seen in ICU.  Main c/o is myalgias and other "pains all over". No sig SOB or CP. Had 3 days of diarrhea, better today. No vomiting. Pt says his legs are "always swollen", today they are less swollen than usual. His penis / scrotum however are very much swollen now which is not the usual for him.   Has not missed any HD. Lives by himself. He sometimes drives to HD, sometimes uses a transport company.   ROS - denies CP, no joint pain, no HA, no blurry vision, no rash, no diarrhea, no nausea/ vomiting, no dysuria, no difficulty voiding   Past Medical History  Past Medical History:  Diagnosis Date   Acute combined systolic and diastolic congestive heart failure (HCC) 03/21/2014   Bacterial endocarditis - MSSA positive blood cultures with mitral valve vegetation, severe mitral regurgitation, and septic embolization 03/21/2014   Chronic diastolic congestive heart failure (HCC)    DVT of upper extremity (deep vein thrombosis) (HCC) 03/04/2014   Right arm   ESRD (end stage renal disease) on dialysis  (HCC)    [place],Dialysis- T,Th,S   Hypertension    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 The aneurysm measures 1.4 x 1.1 cm which is  significantly larger than the 4.5 mm parent vessel.    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   Past Surgical History  Past Surgical History:  Procedure Laterality Date   AV FISTULA PLACEMENT Left ?2005   forearm    BASCILIC VEIN TRANSPOSITION Left 12/27/2015   Procedure: FIRST STAGE BASILIC VEIN TRANSPOSITION;  Surgeon: Fransisco Hertz, MD;  Location: [facility] OR;  Service: Vascular;  Laterality: Left;   BASCILIC VEIN TRANSPOSITION Left 02/12/2016   Procedure: SECOND STAGE BASILIC VEIN TRANSPOSITION;  Surgeon: Fransisco Hertz, MD;  Location: [facility] OR;  Service: Vascular;  Laterality: Left;  EMBOLECTOMY Left 03/25/2014   Procedure: EMBOLECTOMY left popliteal;  Surgeon: Larina Earthly, MD;  Location: [facility] OR;  Service: Vascular;  Laterality: Left;   FEMORAL-POPLITEAL BYPASS GRAFT Left 03/25/2014   Procedure: Left Femoral- Below Knee Popliteal Bypass Graft;  Surgeon: Larina Earthly, MD;  Location: [facility] OR;  Service: Vascular;  Laterality: Left;   INTRAOPERATIVE TRANSESOPHAGEAL ECHOCARDIOGRAM N/A 04/05/2014   Procedure: INTRAOPERATIVE TRANSESOPHAGEAL ECHOCARDIOGRAM;  Surgeon:  Purcell Nails, MD;  Location: [facility] OR;  Service: Open Heart Surgery;  Laterality: N/A;   LEFT AND RIGHT HEART CATHETERIZATION WITH CORONARY ANGIOGRAM N/A 03/28/2014   Procedure: LEFT AND RIGHT HEART CATHETERIZATION WITH CORONARY ANGIOGRAM;  Surgeon: Laurey Morale, MD;  Location: [facility] CATH LAB;  Service: Cardiovascular;  Laterality: N/A;   MITRAL VALVE REPAIR Right 04/05/2014   Procedure: MINIMALLY INVASIVE MITRAL VALVE REPAIR (MVR);  Surgeon: Purcell Nails, MD;  Location: [facility] OR;  Service: Open Heart Surgery;  Laterality: Right;   MITRAL VALVE REPLACEMENT Right 04/05/2014   Procedure: Bring back MINIMALLY INVASIVE MITRAL VALVE (MV) REPLACEMENT - Reexploration for bleeding;  Surgeon: Purcell Nails, MD;  Location: [facility] OR;  Service: Open Heart Surgery;  Laterality: Right;   PARATHYROIDECTOMY N/A 03/22/2016   Procedure: PARATHYROIDECTOMY AUTOTRANSPLANT;  Surgeon: Darnell Level, MD;  Location: [facility] OR;  Service: General;  Laterality: N/A;   PATCH ANGIOPLASTY Left 07/23/2013   Procedure: PATCH ANGIOPLASTY- LEFT RADIOCEPHALIC ARTERIOVENOUS FISTULA;  Surgeon: Chuck Hint, MD;  Location: [facility] OR;  Service: Vascular;  Laterality: Left;   REVISON OF ARTERIOVENOUS FISTULA Left 11/04/2019   Procedure: REVISON OF LEFT ARM ARTERIOVENOUS FISTULA;  Surgeon: Larina Earthly, MD;  Location: [facility] OR;  Service: Vascular;  Laterality: Left;   SHUNTOGRAM Left November 04, 2011   TEE WITHOUT CARDIOVERSION N/A 03/24/2014   Procedure: TRANSESOPHAGEAL ECHOCARDIOGRAM (TEE);  Surgeon: Laurey Morale, MD;  Location: [facility] ENDOSCOPY;  Service: Cardiovascular;  Laterality: N/A;   TEE WITHOUT CARDIOVERSION N/A 04/12/2015   Procedure: TRANSESOPHAGEAL ECHOCARDIOGRAM (TEE);  Surgeon: Laurey Morale, MD;  Location: [facility] ENDOSCOPY;  Service: Cardiovascular;  Laterality: N/A;   TEE WITHOUT CARDIOVERSION N/A 08/18/2015   Procedure: TRANSESOPHAGEAL ECHOCARDIOGRAM (TEE);  Surgeon: Laurey Morale, MD;  Location: [facility] ENDOSCOPY;  Service: Cardiovascular;   Laterality: N/A;   Family History  Family History  Problem Relation Age of Onset   Diabetes Mother    Hypertension Mother    Hypertension Father    Hypertension Sister    Hypertension Brother    Social History  reports that he has never smoked. He has never used smokeless tobacco. He reports current alcohol use. He reports current drug use. Frequency: 7.00 times per week. Drug: Marijuana. Allergies No Known Allergies Home medications Prior to Admission medications   Medication Sig Start Date End Date Taking? Authorizing Provider  lisinopril (PRINIVIL,ZESTRIL) 20 MG tablet Take 20 mg by mouth daily.   Yes [provider]  multivitamin (RENA-VIT) TABS tablet Take 1 tablet by mouth at bedtime. Patient taking differently: Take 1 tablet by mouth 3 (three) times a week. 04/14/15  Yes Hongalgi, Maximino Greenland, MD  sevelamer carbonate (RENVELA) 800 MG tablet Take 1,600 mg by mouth daily.   Yes [provider]  Nutritional Supplements (FEEDING SUPPLEMENT, NEPRO CARB STEADY,) LIQD Take 237 mLs by mouth 2 (two) times daily between meals. Patient not taking: Reported on 11/04/2019 04/14/15   Marcellus Scott D, MD     Vitals:   03/29/23 1445 03/29/23 1500 03/29/23 1515 03/29/23 1530  BP: Marland Kitchen)  99/59 99/63 99/62  110/67  Pulse: (!) 59 (!) 58 (!) 58 (!) 56  Resp: (!) 29 (!) 30 (!) 37 (!) 43  Temp:      TempSrc:      SpO2: 98% 97% 95% 96%  Weight:      Height:       Exam Gen alert, no distress, 2 L [place] No rash, cyanosis or gangrene Sclera anicteric, throat clear  No jvd or bruits Chest clear bilat to bases, no rales/ wheezing RRR no RG Abd soft ntnd no mass or ascites +bs GU 3+ edema of penis and scrotum MS no joint effusions or deformity Ext chronic skin changes bilat LE's, 1-2+ diffuse bilat LE edema Neuro is alert, Ox 3 , nf    LUA AVF+bruit       Renal-related home meds: - lisinopril 20 every day - renavite daily - renvela 1600mg  ac tid - nepro prn bid    [facility])  4.5hr  450/1.5    119kg  2/2 bath  AVF LUA   Heparin 5000 - last OP HD 9/19, post wt 118.4kg  - rocaltrol 0.75 mcg po tts - sensipar 150 mg po tts - no esa (last Hb 11.6 on 9/19)   CXR - no acute disease  Assessment/ Plan: Shock - no fever but WBC 22k, BP's in the 80's. Admitted, blood cx's pending, getting IV abx and pressor support. Not sure cardiac vs septic shock or both.  Hypoglycemia - per CCM Hyperkalemia - K+ 5.9- 6.3 here. Will give IV Ca, bicarb and nebulized high-dose albuterol as temporizing measures. Will plan on HD in ICU tonight.  ESRD - on HD TTS. Has not missed HD. Due to sig shock pt will need CRRT.  See orders.  Volume - chronic bilat LE edema ("not as bad as usual"), 4kg under and CXR clear. Bad RV function and shock. Will keep even w/ CRRT.  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 Follow.  MBD ckd - CCa in range, add on phos. Cont po vdra and sensipar. Resume renvela as binder when eating.       Vinson Moselle  MD [facility] 03/29/2023, 4:23 PM  Recent Labs  Lab 03/29/23 1013 03/29/23 1118 03/29/23 1119 03/29/23 1158  HGB  --    < > 12.9* 11.6*  ALBUMIN 2.2*  --   --   --   CALCIUM 7.0*  --   --   --   CREATININE 10.07*  --  10.60* 10.11*  K 5.9*   < > 6.3* 6.2*   < > = values in this interval not displayed.   Inpatient medications:  Chlorhexidine Gluconate Cloth  6 each Topical Daily   famotidine  20 mg Oral Daily   insulin aspart  0-6 Units Subcutaneous Q4H    [START ON 03/30/2023] ceFEPime (MAXIPIME) IV     dextrose Stopped (03/29/23 1046)   heparin 1,600 Units/hr (03/29/23 1601)   norepinephrine (LEVOPHED) Adult infusion 3 mcg/min (03/29/23 1601)   [START ON 04/01/2023] vancomycin     docusate sodium, polyethylene glycol

## 2023-03-29 NOTE — Progress Notes (Signed)
Contacted by medical ICU regarding this patient who was seen earlier by Dr. Rennis Golden.  Patient was intubated for progressive respiratory failure and was noted to be in a wide-complex tachycardia with a rate of 140 and escalating pressor requirement.  ECG reviewed and showed wide-complex tachycardia with QRS around 116 ms with right bundle branch morphology with complex transition in V3, inferior and rightward axis consistent with ventricular tachycardia.  I advised the MICU team to cardiovert the patient which was successful.

## 2023-03-30 ENCOUNTER — Inpatient Hospital Stay (HOSPITAL_COMMUNITY): Payer: Medicare Other

## 2023-03-30 DIAGNOSIS — I350 Nonrheumatic aortic (valve) stenosis: Secondary | ICD-10-CM | POA: Diagnosis not present

## 2023-03-30 DIAGNOSIS — R579 Shock, unspecified: Secondary | ICD-10-CM | POA: Diagnosis not present

## 2023-03-30 DIAGNOSIS — Z992 Dependence on renal dialysis: Secondary | ICD-10-CM

## 2023-03-30 DIAGNOSIS — N186 End stage renal disease: Secondary | ICD-10-CM | POA: Diagnosis not present

## 2023-03-30 DIAGNOSIS — J9601 Acute respiratory failure with hypoxia: Secondary | ICD-10-CM | POA: Diagnosis not present

## 2023-03-30 DIAGNOSIS — R9431 Abnormal electrocardiogram [ECG] [EKG]: Secondary | ICD-10-CM

## 2023-03-30 DIAGNOSIS — I50810 Right heart failure, unspecified: Secondary | ICD-10-CM | POA: Insufficient documentation

## 2023-03-30 LAB — RENAL FUNCTION PANEL
Albumin: 2.1 g/dL — ABNORMAL LOW (ref 3.5–5.0)
Albumin: 1.9 g/dL — ABNORMAL LOW (ref 3.5–5.0)
Anion gap: 21 — ABNORMAL HIGH (ref 5–15)
Anion gap: 15 (ref 5–15)
BUN: 63 mg/dL — ABNORMAL HIGH (ref 6–20)
BUN: 44 mg/dL — ABNORMAL HIGH (ref 6–20)
CO2: 19 mmol/L — ABNORMAL LOW (ref 22–32)
CO2: 21 mmol/L — ABNORMAL LOW (ref 22–32)
Calcium: 6.6 mg/dL — ABNORMAL LOW (ref 8.9–10.3)
Calcium: 6.5 mg/dL — ABNORMAL LOW (ref 8.9–10.3)
Chloride: 94 mmol/L — ABNORMAL LOW (ref 98–111)
Chloride: 99 mmol/L (ref 98–111)
Creatinine, Ser: 7.69 mg/dL — ABNORMAL HIGH (ref 0.61–1.24)
Creatinine, Ser: 5.41 mg/dL — ABNORMAL HIGH (ref 0.61–1.24)
GFR, Estimated: 8 mL/min — ABNORMAL LOW (ref >60)
GFR, Estimated: 12 mL/min — ABNORMAL LOW (ref >60)
Glucose, Bld: 133 mg/dL — ABNORMAL HIGH (ref 70–99)
Glucose, Bld: 126 mg/dL — ABNORMAL HIGH (ref 70–99)
Phosphorus: 7 mg/dL — ABNORMAL HIGH (ref 2.5–4.6)
Phosphorus: 5.8 mg/dL — ABNORMAL HIGH (ref 2.5–4.6)
Potassium: 6 mmol/L — ABNORMAL HIGH (ref 3.5–5.1)
Potassium: 5 mmol/L (ref 3.5–5.1)
Sodium: 134 mmol/L — ABNORMAL LOW (ref 135–145)
Sodium: 135 mmol/L (ref 135–145)

## 2023-03-30 LAB — POCT I-STAT 7, (LYTES, BLD GAS, ICA,H+H)
Acid-Base Excess: 1 mmol/L (ref 0.0–2.0)
Acid-Base Excess: 0 mmol/L (ref 0.0–2.0)
Bicarbonate: 23.9 mmol/L (ref 20.0–28.0)
Bicarbonate: 23.2 mmol/L (ref 20.0–28.0)
Calcium, Ion: 0.75 mmol/L — CL (ref 1.15–1.40)
Calcium, Ion: 0.88 mmol/L — CL (ref 1.15–1.40)
HCT: 37 % — ABNORMAL LOW (ref 39.0–52.0)
HCT: 35 % — ABNORMAL LOW (ref 39.0–52.0)
Hemoglobin: 12.6 g/dL — ABNORMAL LOW (ref 13.0–17.0)
Hemoglobin: 11.9 g/dL — ABNORMAL LOW (ref 13.0–17.0)
O2 Saturation: 100 %
O2 Saturation: 100 %
Patient temperature: 98.5
Potassium: 6 mmol/L — ABNORMAL HIGH (ref 3.5–5.1)
Potassium: 5.1 mmol/L (ref 3.5–5.1)
Sodium: 134 mmol/L — ABNORMAL LOW (ref 135–145)
Sodium: 135 mmol/L (ref 135–145)
TCO2: 25 mmol/L (ref 22–32)
TCO2: 24 mmol/L (ref 22–32)
pCO2 arterial: 32 mmHg (ref 32–48)
pCO2 arterial: 33.2 mmHg (ref 32–48)
pH, Arterial: 7.48 — ABNORMAL HIGH (ref 7.35–7.45)
pH, Arterial: 7.452 — ABNORMAL HIGH (ref 7.35–7.45)
pO2, Arterial: 285 mmHg — ABNORMAL HIGH (ref 83–108)
pO2, Arterial: 166 mmHg — ABNORMAL HIGH (ref 83–108)

## 2023-03-30 LAB — LACTIC ACID, PLASMA: Lactic Acid, Venous: 4.8 mmol/L (ref 0.5–1.9)

## 2023-03-30 LAB — BASIC METABOLIC PANEL
Anion gap: 20 — ABNORMAL HIGH (ref 5–15)
BUN: 51 mg/dL — ABNORMAL HIGH (ref 6–20)
CO2: 20 mmol/L — ABNORMAL LOW (ref 22–32)
Calcium: 6.7 mg/dL — ABNORMAL LOW (ref 8.9–10.3)
Chloride: 94 mmol/L — ABNORMAL LOW (ref 98–111)
Creatinine, Ser: 5.71 mg/dL — ABNORMAL HIGH (ref 0.61–1.24)
GFR, Estimated: 11 mL/min — ABNORMAL LOW (ref >60)
Glucose, Bld: 126 mg/dL — ABNORMAL HIGH (ref 70–99)
Potassium: 5.1 mmol/L (ref 3.5–5.1)
Sodium: 134 mmol/L — ABNORMAL LOW (ref 135–145)

## 2023-03-30 LAB — GLUCOSE, CAPILLARY
Glucose-Capillary: 138 mg/dL — ABNORMAL HIGH (ref 70–99)
Glucose-Capillary: 132 mg/dL — ABNORMAL HIGH (ref 70–99)
Glucose-Capillary: 139 mg/dL — ABNORMAL HIGH (ref 70–99)
Glucose-Capillary: 121 mg/dL — ABNORMAL HIGH (ref 70–99)
Glucose-Capillary: 113 mg/dL — ABNORMAL HIGH (ref 70–99)
Glucose-Capillary: 139 mg/dL — ABNORMAL HIGH (ref 70–99)

## 2023-03-30 LAB — HEPARIN LEVEL (UNFRACTIONATED)
Heparin Unfractionated: 0.22 IU/mL — ABNORMAL LOW (ref 0.30–0.70)
Heparin Unfractionated: 0.16 IU/mL — ABNORMAL LOW (ref 0.30–0.70)

## 2023-03-30 LAB — APTT
aPTT: 67 seconds — ABNORMAL HIGH (ref 24–36)
aPTT: 65 seconds — ABNORMAL HIGH (ref 24–36)

## 2023-03-30 LAB — TROPONIN I (HIGH SENSITIVITY)
Troponin I (High Sensitivity): 1254 ng/L (ref <18)
Troponin I (High Sensitivity): 1293 ng/L (ref <18)
Troponin I (High Sensitivity): 1601 ng/L (ref <18)

## 2023-03-30 LAB — MAGNESIUM: Magnesium: 2.5 mg/dL — ABNORMAL HIGH (ref 1.7–2.4)

## 2023-03-30 LAB — CBC
HCT: 32.7 % — ABNORMAL LOW (ref 39.0–52.0)
Hemoglobin: 11.6 g/dL — ABNORMAL LOW (ref 13.0–17.0)
MCH: 29 pg (ref 26.0–34.0)
MCHC: 35.5 g/dL (ref 30.0–36.0)
MCV: 81.8 fL (ref 80.0–100.0)
Platelets: 233 10*3/uL (ref 150–400)
RBC: 4 MIL/uL — ABNORMAL LOW (ref 4.22–5.81)
RDW: 16.6 % — ABNORMAL HIGH (ref 11.5–15.5)
WBC: 22.2 10*3/uL — ABNORMAL HIGH (ref 4.0–10.5)
nRBC: 0.6 % — ABNORMAL HIGH (ref 0.0–0.2)

## 2023-03-30 MED ORDER — PRISMASOL BGK 0/2.5 32-2.5 MEQ/L EC SOLN
Status: DC
  Filled 2023-03-30 (×13): qty 5000

## 2023-03-30 MED ORDER — CALCIUM GLUCONATE-NACL 1-0.675 GM/50ML-% IV SOLN
1.0000 g | Freq: Once | INTRAVENOUS | Status: AC
  Administered 2023-03-30: 1000 mg via INTRAVENOUS
  Filled 2023-03-30: qty 50

## 2023-03-30 MED ORDER — PRISMASOL BGK 0/2.5 32-2.5 MEQ/L REPLACEMENT SOLN
Status: DC
  Filled 2023-03-30 (×5): qty 5000

## 2023-03-30 MED ORDER — CALCIUM GLUCONATE-NACL 2-0.675 GM/100ML-% IV SOLN
2.0000 g | Freq: Once | INTRAVENOUS | Status: AC
  Administered 2023-03-30: 2000 mg via INTRAVENOUS
  Filled 2023-03-30: qty 100

## 2023-03-30 MED ORDER — CALCITRIOL 0.5 MCG PO CAPS
0.7500 ug | ORAL_CAPSULE | ORAL | Status: DC
  Administered 2023-04-03: 0.75 ug
  Filled 2023-03-30 (×4): qty 1

## 2023-03-30 MED ORDER — DOCUSATE SODIUM 50 MG/5ML PO LIQD
100.0000 mg | Freq: Two times a day (BID) | ORAL | Status: DC
  Administered 2023-03-30 – 2023-04-03 (×9): 100 mg
  Filled 2023-03-30 (×8): qty 10

## 2023-03-30 MED ORDER — GERHARDT'S BUTT CREAM
TOPICAL_CREAM | CUTANEOUS | Status: DC | PRN
  Filled 2023-03-30: qty 1

## 2023-03-30 MED ORDER — POLYETHYLENE GLYCOL 3350 17 G PO PACK
17.0000 g | PACK | Freq: Every day | ORAL | Status: DC
  Administered 2023-03-30 – 2023-04-03 (×3): 17 g
  Filled 2023-03-30 (×2): qty 1

## 2023-03-30 MED ORDER — CARMEX CLASSIC LIP BALM EX OINT
TOPICAL_OINTMENT | CUTANEOUS | Status: DC | PRN
  Filled 2023-03-30: qty 10

## 2023-03-30 NOTE — Progress Notes (Addendum)
Patient w/ vtach rhythm 150s w/ stable bp maps 80s. Given amp of amio without change. Cardioverted x1 and rhythm 60s.

## 2023-03-30 NOTE — Progress Notes (Signed)
[name] Progress Note  Subjective: on CRRT, had to be intubated yesterday. Sedated on the vent now. On about 5 of levophed gtt, vaso at 0.4.   Vitals:   03/30/23 1100 03/30/23 1115 03/30/23 1129 03/30/23 1140  BP:   (!) 126/45   Pulse: 63 63    Resp: (!) 24 (!) 24 (!) 24   Temp:    98.5 F (36.9 C)  TempSrc:    Oral  SpO2: 100% 100% 100%   Weight:      Height:        Exam: on vent ,sedated  no jvd  throat ett in place  Chest cta bilat and lat  Cor reg no RG  Abd soft ntnd no ascites  GU +2+ scrotal/ penile edema   Ext 1-2+ bilat LE edema w/ chronic skin changes   Neuro on vent and sedated, not following commands       LUA AVF+bruit    Renal-related home meds: - lisinopril 20 every day - renavite daily - renvela 1600mg  ac tid - nepro prn bid      [facility] since 2005)  4.5hr  450/1.5    119kg  2/2 bath  AVF LUA   Heparin 5000 - last OP HD 9/19, post wt 118.4kg  - rocaltrol 0.75 mcg po tts - sensipar 150 mg po tts - no esa (last Hb 11.6 on 9/19)    CXR 9/21 - no acute disease   Assessment/ Plan: Shock - on IV abx and pressor support Ventricular tachycardia - cards consulting Severe AS / RV dysfunction - by echo, normal LVEF Hyperkalemia - peak 6.9 yest, coming down to 6.0, cont all 0K fluids  ESRD - HD TTS. Had not missed HD. CRRT started 9/21. Continue.  Volume - chronic bilat LE edema which is less than usual per pt. Well under dry wt, last CXR clear. Bad RV function and shock. Keeping even w/ CRRT.  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 Follow.  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 Cont po vdra by NG tube. Okay to hold sensipar for now. Resume binder/ sensipar when eating.  H/o staph MV endocarditis w/ mesenteric mycotic aneurysm - in 2015. Underwent min-invasive MV repair.     Vinson Moselle MD  [facility] 03/30/2023, 11:48 AM  Recent Labs  Lab  0000 03/29/23 0927 03/29/23 1013 03/29/23 1118 03/29/23 1912 03/29/23 2120 03/30/23 0134  03/30/23 0312 03/30/23 0359  HGB  --   --   --    < > 11.8*   < > 11.6*  --  12.6*  ALBUMIN  --   --  2.2*  --   --   --   --  2.1*  --   CALCIUM   < >  --  7.0*  --  7.9*  --   --  6.6*  --   PHOS  --  9.8*  --   --   --   --   --  7.0*  --   CREATININE  --   --  10.07*   < > 10.23*  --   --  7.69*  --   K  --   --  5.9*   < > 6.9*   < >  --  6.0* 6.0*   < > = values in this interval not displayed.   No results for input(s): "IRON", "TIBC", "FERRITIN" in the last 168 hours. Inpatient medications:  [START ON  04/01/2023] calcitRIOL  0.75 mcg Per Tube Q T,Th,Sat-1800   Chlorhexidine Gluconate Cloth  6 each Topical Daily   docusate  100 mg Per Tube BID   hydrocortisone sod succinate (SOLU-CORTEF) inj  100 mg Intravenous Q8H   mouth rinse  15 mL Mouth Rinse Q2H   pantoprazole (PROTONIX) IV  40 mg Intravenous Daily   polyethylene glycol  17 g Per Tube Daily    ceFEPime (MAXIPIME) IV Stopped (03/30/23 0748)   dextrose Stopped (03/29/23 1046)   fentaNYL infusion INTRAVENOUS 125 mcg/hr (03/30/23 1100)   heparin 1,650 Units/hr (03/30/23 1100)   norepinephrine (LEVOPHED) Adult infusion 4 mcg/min (03/30/23 1100)   prismasol BGK 0/2.5 400 mL/hr at 03/30/23 0934   prismasol BGK 0/2.5 400 mL/hr at 03/30/23 0936   prismasol BGK 0/2.5 1,500 mL/hr at 03/30/23 0938   vancomycin     vasopressin 0.04 Units/min (03/30/23 1100)   docusate, fentaNYL, heparin, midazolam, mouth rinse, polyethylene glycol

## 2023-03-30 NOTE — Progress Notes (Signed)
Patient in wide complex tachycardia/vtach. Patient cardioverted X 1 with return to SB 60. Amiodarone drip stopped. JD Suzie Portela, PA-C at bedside. Pads left on patient.

## 2023-03-30 NOTE — H&P (Signed)
NAME:  Timothy Palmer, MRN:  [id number], DOB:  09/20/1965, LOS: 1 ADMISSION DATE:  03/29/2023, CONSULTATION DATE:  03/29/2023 REFERRING MD:  , CHIEF COMPLAINT:  generalized shock ( cardiogenic/ septic) , hypotension, elevated troponin    History of Present Illness:  57 year old male patient with history of ESRD ( T, Th, Sat) , Systolic and diastolic CHF, HTN, Mitral valve repair ( 2015) and replacement 2/2 MSSA  endocarditis( 2016), Mycotic aneurysm, Upper extremity DVT( 2015) and PVD. Presented to the Ed 9/21 with history or 4 days of diarrhea which resolved 9/20, with poor po intake and fatigue with weakness and body aches. In the ED he had Lactate of 11.9, Troponin of 900, bedside echo by ED MD showed decreased EF. Last echo 2016 showed EF of 50-55%. He is not followed by cardiology. He is not on blood thinners as they caused his HD graph to bleed. PCCM were notified by ED MD  and asked to admit the patient and manage care .  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   has a past surgical history that includes AV fistula placement (Left, ?2005); Shuntogram (Left, November 04, 2011); Patch angioplasty (Left,  07/23/2013); TEE without cardioversion (N/A, 03/24/2014); Embolectomy (Left, 03/25/2014); Femoral-popliteal Bypass Graft (Left, 03/25/2014); Mitral valve repair (Right, 04/05/2014); Intraoprative transesophageal echocardiogram (N/A, 04/05/2014); Mitral valve replacement (Right, 04/05/2014); left and right heart catheterization with coronary angiogram (N/A, 03/28/2014); TEE without cardioversion (N/A, 04/12/2015); TEE without cardioversion (N/A, 08/18/2015); Bascilic vein transposition (Left, 12/27/2015); Bascilic vein transposition (Left, 02/12/2016); Parathyroidectomy (N/A, 03/22/2016); and Revison of arteriovenous fistula (Left, 11/04/2019).   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  Nephrology started CRRT due to shock.  Patient went on pressors.  HD cath placed.  Later intubated.  Also had had wide-complex rhythm agonal respirations but appears did not lose any pulses.  Received cardioversion received stat hyperkalemia treatment.  Status post arterial line insertion  Interim History / Subjective:   9/22: Is on the ventilator FiO2 40%.  On fentanyl infusion.  Also on Levophed and vasopressin.  On heparin infusion.  Is on antibiotics cefepime.  Tmax is below 99 Fahrenheit.  Cultures are negative so far.  He is on CRRT.  Seen by cardiology today QTc prolonged 612 ms.  Felt to be a factor in SVT.  Unclear if this SBE.  Able to follow commands through sedation.  RN reports low diastolic  Objective   Blood pressure (!) 126/45, pulse 63, temperature 98.3 F (36.8 C), temperature source Oral, resp. rate (!) 24, height 5\' 11"  (1.803 m), weight 111.7 kg, SpO2 100%.  Weight:       240.0 lb Date of Birth:  1966/03/18     BSA:          2.278 m Patient Age:    56 years       BP:           90/63 mmHg Patient Gender: M              HR:           63 bpm. Exam Location:  Inpatient Procedure: 2D Echo, Color Doppler and Cardiac Doppler Indications:    Shock  History:        Patient has prior history of Echocardiogram examinations, most                 recent 08/19/2015. Risk Factors:Hypertension. 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                  Mitral Valve: 28 mm Sorin Mitral Memo 3D prosthetic annuloplasty                 ring valve is present in the mitral position.  Sonographer:    Aron Baba Referring Phys: [id number] SARAH F GROCE IMPRESSIONS  1. Left ventricular ejection fraction, by estimation, is 60 to 65%. The left ventricle has normal function. The left  ventricle demonstrates regional wall motion abnormalities (see scoring diagram/findings for description). There is mild left ventricular  hypertrophy. Left ventricular diastolic parameters are consistent with Grade I diastolic dysfunction (impaired relaxation). Elevated left ventricular end-diastolic pressure. There is the interventricular septum is flattened in systole and diastole, consistent with right ventricular pressure and volume overload.  2. Right ventricular systolic function is moderately reduced. The right ventricular size is severely enlarged. There is severely elevated pulmonary artery systolic pressure. The estimated right ventricular systolic pressure is 73.3 mmHg.  3. Left atrial size was mildly dilated.  4. Right atrial size was moderately dilated.  5. The mitral valve has been repaired/replaced. Trivial mitral valve regurgitation. No evidence of mitral stenosis. The mean mitral valve gradient is 4.0 mmHg with average heart rate of 68 bpm. There is a 28 mm Sorin Mitral Memo 3D prosthetic annuloplasty ring present in the mitral position.  6. The tricuspid valve is abnormal.  7. Cannot exclude aortic valve vegetation. The aortic valve is calcified. There is severe calcifcation of the aortic valve. There is moderate thickening of the aortic valve. Aortic valve regurgitation is not visualized. Severe aortic valve stenosis. Aortic valve area, by VTI measures 0.53 cm. Aortic valve mean gradient measures 32.7 mmHg. Aortic valve Vmax measures 4.00 m/s. Peak gradient 64 mmHg, DI is 0.23.  8. The inferior vena cava is normal in size with <50% respiratory variability, suggesting right atrial pressure of 8 mmHg. Comparison(s): Changes from prior study are noted. 08/19/2015: LVEF 55-60%, MV s/p repair, no stenosis, mild AS. Conclusion(s)/Recommendation(s): Critical findings reported to Dr. Delton Coombes and acknowledged at 1:15 pm. FINDINGS  Left Ventricle: Left ventricular ejection fraction, by estimation, is 60 to  65%. The left ventricle has normal function. The left ventricle demonstrates regional wall motion abnormalities. The left ventricular internal cavity size was small. There is mild left ventricular hypertrophy. The interventricular septum is flattened in systole and diastole, consistent with right ventricular pressure and volume overload. Left ventricular diastolic parameters are consistent with Grade I diastolic dysfunction (  NAME:  Timothy Palmer, MRN:  [id number], DOB:  09/20/1965, LOS: 1 ADMISSION DATE:  03/29/2023, CONSULTATION DATE:  03/29/2023 REFERRING MD:  , CHIEF COMPLAINT:  generalized shock ( cardiogenic/ septic) , hypotension, elevated troponin    History of Present Illness:  57 year old male patient with history of ESRD ( T, Th, Sat) , Systolic and diastolic CHF, HTN, Mitral valve repair ( 2015) and replacement 2/2 MSSA  endocarditis( 2016), Mycotic aneurysm, Upper extremity DVT( 2015) and PVD. Presented to the Ed 9/21 with history or 4 days of diarrhea which resolved 9/20, with poor po intake and fatigue with weakness and body aches. In the ED he had Lactate of 11.9, Troponin of 900, bedside echo by ED MD showed decreased EF. Last echo 2016 showed EF of 50-55%. He is not followed by cardiology. He is not on blood thinners as they caused his HD graph to bleed. PCCM were notified by ED MD  and asked to admit the patient and manage care .  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   has a past surgical history that includes AV fistula placement (Left, ?2005); Shuntogram (Left, November 04, 2011); Patch angioplasty (Left,  07/23/2013); TEE without cardioversion (N/A, 03/24/2014); Embolectomy (Left, 03/25/2014); Femoral-popliteal Bypass Graft (Left, 03/25/2014); Mitral valve repair (Right, 04/05/2014); Intraoprative transesophageal echocardiogram (N/A, 04/05/2014); Mitral valve replacement (Right, 04/05/2014); left and right heart catheterization with coronary angiogram (N/A, 03/28/2014); TEE without cardioversion (N/A, 04/12/2015); TEE without cardioversion (N/A, 08/18/2015); Bascilic vein transposition (Left, 12/27/2015); Bascilic vein transposition (Left, 02/12/2016); Parathyroidectomy (N/A, 03/22/2016); and Revison of arteriovenous fistula (Left, 11/04/2019).   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  Nephrology started CRRT due to shock.  Patient went on pressors.  HD cath placed.  Later intubated.  Also had had wide-complex rhythm agonal respirations but appears did not lose any pulses.  Received cardioversion received stat hyperkalemia treatment.  Status post arterial line insertion  Interim History / Subjective:   9/22: Is on the ventilator FiO2 40%.  On fentanyl infusion.  Also on Levophed and vasopressin.  On heparin infusion.  Is on antibiotics cefepime.  Tmax is below 99 Fahrenheit.  Cultures are negative so far.  He is on CRRT.  Seen by cardiology today QTc prolonged 612 ms.  Felt to be a factor in SVT.  Unclear if this SBE.  Able to follow commands through sedation.  RN reports low diastolic  Objective   Blood pressure (!) 126/45, pulse 63, temperature 98.3 F (36.8 C), temperature source Oral, resp. rate (!) 24, height 5\' 11"  (1.803 m), weight 111.7 kg, SpO2 100%.  Weight:       240.0 lb Date of Birth:  1966/03/18     BSA:          2.278 m Patient Age:    56 years       BP:           90/63 mmHg Patient Gender: M              HR:           63 bpm. Exam Location:  Inpatient Procedure: 2D Echo, Color Doppler and Cardiac Doppler Indications:    Shock  History:        Patient has prior history of Echocardiogram examinations, most                 recent 08/19/2015. Risk Factors:Hypertension. 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                  Mitral Valve: 28 mm Sorin Mitral Memo 3D prosthetic annuloplasty                 ring valve is present in the mitral position.  Sonographer:    Aron Baba Referring Phys: [id number] SARAH F GROCE IMPRESSIONS  1. Left ventricular ejection fraction, by estimation, is 60 to 65%. The left ventricle has normal function. The left  ventricle demonstrates regional wall motion abnormalities (see scoring diagram/findings for description). There is mild left ventricular  hypertrophy. Left ventricular diastolic parameters are consistent with Grade I diastolic dysfunction (impaired relaxation). Elevated left ventricular end-diastolic pressure. There is the interventricular septum is flattened in systole and diastole, consistent with right ventricular pressure and volume overload.  2. Right ventricular systolic function is moderately reduced. The right ventricular size is severely enlarged. There is severely elevated pulmonary artery systolic pressure. The estimated right ventricular systolic pressure is 73.3 mmHg.  3. Left atrial size was mildly dilated.  4. Right atrial size was moderately dilated.  5. The mitral valve has been repaired/replaced. Trivial mitral valve regurgitation. No evidence of mitral stenosis. The mean mitral valve gradient is 4.0 mmHg with average heart rate of 68 bpm. There is a 28 mm Sorin Mitral Memo 3D prosthetic annuloplasty ring present in the mitral position.  6. The tricuspid valve is abnormal.  7. Cannot exclude aortic valve vegetation. The aortic valve is calcified. There is severe calcifcation of the aortic valve. There is moderate thickening of the aortic valve. Aortic valve regurgitation is not visualized. Severe aortic valve stenosis. Aortic valve area, by VTI measures 0.53 cm. Aortic valve mean gradient measures 32.7 mmHg. Aortic valve Vmax measures 4.00 m/s. Peak gradient 64 mmHg, DI is 0.23.  8. The inferior vena cava is normal in size with <50% respiratory variability, suggesting right atrial pressure of 8 mmHg. Comparison(s): Changes from prior study are noted. 08/19/2015: LVEF 55-60%, MV s/p repair, no stenosis, mild AS. Conclusion(s)/Recommendation(s): Critical findings reported to Dr. Delton Coombes and acknowledged at 1:15 pm. FINDINGS  Left Ventricle: Left ventricular ejection fraction, by estimation, is 60 to  65%. The left ventricle has normal function. The left ventricle demonstrates regional wall motion abnormalities. The left ventricular internal cavity size was small. There is mild left ventricular hypertrophy. The interventricular septum is flattened in systole and diastole, consistent with right ventricular pressure and volume overload. Left ventricular diastolic parameters are consistent with Grade I diastolic dysfunction (  NAME:  Timothy Palmer, MRN:  [id number], DOB:  09/20/1965, LOS: 1 ADMISSION DATE:  03/29/2023, CONSULTATION DATE:  03/29/2023 REFERRING MD:  , CHIEF COMPLAINT:  generalized shock ( cardiogenic/ septic) , hypotension, elevated troponin    History of Present Illness:  57 year old male patient with history of ESRD ( T, Th, Sat) , Systolic and diastolic CHF, HTN, Mitral valve repair ( 2015) and replacement 2/2 MSSA  endocarditis( 2016), Mycotic aneurysm, Upper extremity DVT( 2015) and PVD. Presented to the Ed 9/21 with history or 4 days of diarrhea which resolved 9/20, with poor po intake and fatigue with weakness and body aches. In the ED he had Lactate of 11.9, Troponin of 900, bedside echo by ED MD showed decreased EF. Last echo 2016 showed EF of 50-55%. He is not followed by cardiology. He is not on blood thinners as they caused his HD graph to bleed. PCCM were notified by ED MD  and asked to admit the patient and manage care .  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   has a past surgical history that includes AV fistula placement (Left, ?2005); Shuntogram (Left, November 04, 2011); Patch angioplasty (Left,  07/23/2013); TEE without cardioversion (N/A, 03/24/2014); Embolectomy (Left, 03/25/2014); Femoral-popliteal Bypass Graft (Left, 03/25/2014); Mitral valve repair (Right, 04/05/2014); Intraoprative transesophageal echocardiogram (N/A, 04/05/2014); Mitral valve replacement (Right, 04/05/2014); left and right heart catheterization with coronary angiogram (N/A, 03/28/2014); TEE without cardioversion (N/A, 04/12/2015); TEE without cardioversion (N/A, 08/18/2015); Bascilic vein transposition (Left, 12/27/2015); Bascilic vein transposition (Left, 02/12/2016); Parathyroidectomy (N/A, 03/22/2016); and Revison of arteriovenous fistula (Left, 11/04/2019).   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  Nephrology started CRRT due to shock.  Patient went on pressors.  HD cath placed.  Later intubated.  Also had had wide-complex rhythm agonal respirations but appears did not lose any pulses.  Received cardioversion received stat hyperkalemia treatment.  Status post arterial line insertion  Interim History / Subjective:   9/22: Is on the ventilator FiO2 40%.  On fentanyl infusion.  Also on Levophed and vasopressin.  On heparin infusion.  Is on antibiotics cefepime.  Tmax is below 99 Fahrenheit.  Cultures are negative so far.  He is on CRRT.  Seen by cardiology today QTc prolonged 612 ms.  Felt to be a factor in SVT.  Unclear if this SBE.  Able to follow commands through sedation.  RN reports low diastolic  Objective   Blood pressure (!) 126/45, pulse 63, temperature 98.3 F (36.8 C), temperature source Oral, resp. rate (!) 24, height 5\' 11"  (1.803 m), weight 111.7 kg, SpO2 100%.  Weight:       240.0 lb Date of Birth:  1966/03/18     BSA:          2.278 m Patient Age:    56 years       BP:           90/63 mmHg Patient Gender: M              HR:           63 bpm. Exam Location:  Inpatient Procedure: 2D Echo, Color Doppler and Cardiac Doppler Indications:    Shock  History:        Patient has prior history of Echocardiogram examinations, most                 recent 08/19/2015. Risk Factors:Hypertension. 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                  Mitral Valve: 28 mm Sorin Mitral Memo 3D prosthetic annuloplasty                 ring valve is present in the mitral position.  Sonographer:    Aron Baba Referring Phys: [id number] SARAH F GROCE IMPRESSIONS  1. Left ventricular ejection fraction, by estimation, is 60 to 65%. The left ventricle has normal function. The left  ventricle demonstrates regional wall motion abnormalities (see scoring diagram/findings for description). There is mild left ventricular  hypertrophy. Left ventricular diastolic parameters are consistent with Grade I diastolic dysfunction (impaired relaxation). Elevated left ventricular end-diastolic pressure. There is the interventricular septum is flattened in systole and diastole, consistent with right ventricular pressure and volume overload.  2. Right ventricular systolic function is moderately reduced. The right ventricular size is severely enlarged. There is severely elevated pulmonary artery systolic pressure. The estimated right ventricular systolic pressure is 73.3 mmHg.  3. Left atrial size was mildly dilated.  4. Right atrial size was moderately dilated.  5. The mitral valve has been repaired/replaced. Trivial mitral valve regurgitation. No evidence of mitral stenosis. The mean mitral valve gradient is 4.0 mmHg with average heart rate of 68 bpm. There is a 28 mm Sorin Mitral Memo 3D prosthetic annuloplasty ring present in the mitral position.  6. The tricuspid valve is abnormal.  7. Cannot exclude aortic valve vegetation. The aortic valve is calcified. There is severe calcifcation of the aortic valve. There is moderate thickening of the aortic valve. Aortic valve regurgitation is not visualized. Severe aortic valve stenosis. Aortic valve area, by VTI measures 0.53 cm. Aortic valve mean gradient measures 32.7 mmHg. Aortic valve Vmax measures 4.00 m/s. Peak gradient 64 mmHg, DI is 0.23.  8. The inferior vena cava is normal in size with <50% respiratory variability, suggesting right atrial pressure of 8 mmHg. Comparison(s): Changes from prior study are noted. 08/19/2015: LVEF 55-60%, MV s/p repair, no stenosis, mild AS. Conclusion(s)/Recommendation(s): Critical findings reported to Dr. Delton Coombes and acknowledged at 1:15 pm. FINDINGS  Left Ventricle: Left ventricular ejection fraction, by estimation, is 60 to  65%. The left ventricle has normal function. The left ventricle demonstrates regional wall motion abnormalities. The left ventricular internal cavity size was small. There is mild left ventricular hypertrophy. The interventricular septum is flattened in systole and diastole, consistent with right ventricular pressure and volume overload. Left ventricular diastolic parameters are consistent with Grade I diastolic dysfunction (  NAME:  Timothy Palmer, MRN:  [id number], DOB:  09/20/1965, LOS: 1 ADMISSION DATE:  03/29/2023, CONSULTATION DATE:  03/29/2023 REFERRING MD:  , CHIEF COMPLAINT:  generalized shock ( cardiogenic/ septic) , hypotension, elevated troponin    History of Present Illness:  57 year old male patient with history of ESRD ( T, Th, Sat) , Systolic and diastolic CHF, HTN, Mitral valve repair ( 2015) and replacement 2/2 MSSA  endocarditis( 2016), Mycotic aneurysm, Upper extremity DVT( 2015) and PVD. Presented to the Ed 9/21 with history or 4 days of diarrhea which resolved 9/20, with poor po intake and fatigue with weakness and body aches. In the ED he had Lactate of 11.9, Troponin of 900, bedside echo by ED MD showed decreased EF. Last echo 2016 showed EF of 50-55%. He is not followed by cardiology. He is not on blood thinners as they caused his HD graph to bleed. PCCM were notified by ED MD  and asked to admit the patient and manage care .  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   has a past surgical history that includes AV fistula placement (Left, ?2005); Shuntogram (Left, November 04, 2011); Patch angioplasty (Left,  07/23/2013); TEE without cardioversion (N/A, 03/24/2014); Embolectomy (Left, 03/25/2014); Femoral-popliteal Bypass Graft (Left, 03/25/2014); Mitral valve repair (Right, 04/05/2014); Intraoprative transesophageal echocardiogram (N/A, 04/05/2014); Mitral valve replacement (Right, 04/05/2014); left and right heart catheterization with coronary angiogram (N/A, 03/28/2014); TEE without cardioversion (N/A, 04/12/2015); TEE without cardioversion (N/A, 08/18/2015); Bascilic vein transposition (Left, 12/27/2015); Bascilic vein transposition (Left, 02/12/2016); Parathyroidectomy (N/A, 03/22/2016); and Revison of arteriovenous fistula (Left, 11/04/2019).   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  Nephrology started CRRT due to shock.  Patient went on pressors.  HD cath placed.  Later intubated.  Also had had wide-complex rhythm agonal respirations but appears did not lose any pulses.  Received cardioversion received stat hyperkalemia treatment.  Status post arterial line insertion  Interim History / Subjective:   9/22: Is on the ventilator FiO2 40%.  On fentanyl infusion.  Also on Levophed and vasopressin.  On heparin infusion.  Is on antibiotics cefepime.  Tmax is below 99 Fahrenheit.  Cultures are negative so far.  He is on CRRT.  Seen by cardiology today QTc prolonged 612 ms.  Felt to be a factor in SVT.  Unclear if this SBE.  Able to follow commands through sedation.  RN reports low diastolic  Objective   Blood pressure (!) 126/45, pulse 63, temperature 98.3 F (36.8 C), temperature source Oral, resp. rate (!) 24, height 5\' 11"  (1.803 m), weight 111.7 kg, SpO2 100%.  NAME:  Timothy Palmer, MRN:  [id number], DOB:  09/20/1965, LOS: 1 ADMISSION DATE:  03/29/2023, CONSULTATION DATE:  03/29/2023 REFERRING MD:  , CHIEF COMPLAINT:  generalized shock ( cardiogenic/ septic) , hypotension, elevated troponin    History of Present Illness:  57 year old male patient with history of ESRD ( T, Th, Sat) , Systolic and diastolic CHF, HTN, Mitral valve repair ( 2015) and replacement 2/2 MSSA  endocarditis( 2016), Mycotic aneurysm, Upper extremity DVT( 2015) and PVD. Presented to the Ed 9/21 with history or 4 days of diarrhea which resolved 9/20, with poor po intake and fatigue with weakness and body aches. In the ED he had Lactate of 11.9, Troponin of 900, bedside echo by ED MD showed decreased EF. Last echo 2016 showed EF of 50-55%. He is not followed by cardiology. He is not on blood thinners as they caused his HD graph to bleed. PCCM were notified by ED MD  and asked to admit the patient and manage care .  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   has a past surgical history that includes AV fistula placement (Left, ?2005); Shuntogram (Left, November 04, 2011); Patch angioplasty (Left,  07/23/2013); TEE without cardioversion (N/A, 03/24/2014); Embolectomy (Left, 03/25/2014); Femoral-popliteal Bypass Graft (Left, 03/25/2014); Mitral valve repair (Right, 04/05/2014); Intraoprative transesophageal echocardiogram (N/A, 04/05/2014); Mitral valve replacement (Right, 04/05/2014); left and right heart catheterization with coronary angiogram (N/A, 03/28/2014); TEE without cardioversion (N/A, 04/12/2015); TEE without cardioversion (N/A, 08/18/2015); Bascilic vein transposition (Left, 12/27/2015); Bascilic vein transposition (Left, 02/12/2016); Parathyroidectomy (N/A, 03/22/2016); and Revison of arteriovenous fistula (Left, 11/04/2019).   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  Nephrology started CRRT due to shock.  Patient went on pressors.  HD cath placed.  Later intubated.  Also had had wide-complex rhythm agonal respirations but appears did not lose any pulses.  Received cardioversion received stat hyperkalemia treatment.  Status post arterial line insertion  Interim History / Subjective:   9/22: Is on the ventilator FiO2 40%.  On fentanyl infusion.  Also on Levophed and vasopressin.  On heparin infusion.  Is on antibiotics cefepime.  Tmax is below 99 Fahrenheit.  Cultures are negative so far.  He is on CRRT.  Seen by cardiology today QTc prolonged 612 ms.  Felt to be a factor in SVT.  Unclear if this SBE.  Able to follow commands through sedation.  RN reports low diastolic  Objective   Blood pressure (!) 126/45, pulse 63, temperature 98.3 F (36.8 C), temperature source Oral, resp. rate (!) 24, height 5\' 11"  (1.803 m), weight 111.7 kg, SpO2 100%.  NAME:  Timothy Palmer, MRN:  [id number], DOB:  09/20/1965, LOS: 1 ADMISSION DATE:  03/29/2023, CONSULTATION DATE:  03/29/2023 REFERRING MD:  , CHIEF COMPLAINT:  generalized shock ( cardiogenic/ septic) , hypotension, elevated troponin    History of Present Illness:  57 year old male patient with history of ESRD ( T, Th, Sat) , Systolic and diastolic CHF, HTN, Mitral valve repair ( 2015) and replacement 2/2 MSSA  endocarditis( 2016), Mycotic aneurysm, Upper extremity DVT( 2015) and PVD. Presented to the Ed 9/21 with history or 4 days of diarrhea which resolved 9/20, with poor po intake and fatigue with weakness and body aches. In the ED he had Lactate of 11.9, Troponin of 900, bedside echo by ED MD showed decreased EF. Last echo 2016 showed EF of 50-55%. He is not followed by cardiology. He is not on blood thinners as they caused his HD graph to bleed. PCCM were notified by ED MD  and asked to admit the patient and manage care .  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   has a past surgical history that includes AV fistula placement (Left, ?2005); Shuntogram (Left, November 04, 2011); Patch angioplasty (Left,  07/23/2013); TEE without cardioversion (N/A, 03/24/2014); Embolectomy (Left, 03/25/2014); Femoral-popliteal Bypass Graft (Left, 03/25/2014); Mitral valve repair (Right, 04/05/2014); Intraoprative transesophageal echocardiogram (N/A, 04/05/2014); Mitral valve replacement (Right, 04/05/2014); left and right heart catheterization with coronary angiogram (N/A, 03/28/2014); TEE without cardioversion (N/A, 04/12/2015); TEE without cardioversion (N/A, 08/18/2015); Bascilic vein transposition (Left, 12/27/2015); Bascilic vein transposition (Left, 02/12/2016); Parathyroidectomy (N/A, 03/22/2016); and Revison of arteriovenous fistula (Left, 11/04/2019).   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  Nephrology started CRRT due to shock.  Patient went on pressors.  HD cath placed.  Later intubated.  Also had had wide-complex rhythm agonal respirations but appears did not lose any pulses.  Received cardioversion received stat hyperkalemia treatment.  Status post arterial line insertion  Interim History / Subjective:   9/22: Is on the ventilator FiO2 40%.  On fentanyl infusion.  Also on Levophed and vasopressin.  On heparin infusion.  Is on antibiotics cefepime.  Tmax is below 99 Fahrenheit.  Cultures are negative so far.  He is on CRRT.  Seen by cardiology today QTc prolonged 612 ms.  Felt to be a factor in SVT.  Unclear if this SBE.  Able to follow commands through sedation.  RN reports low diastolic  Objective   Blood pressure (!) 126/45, pulse 63, temperature 98.3 F (36.8 C), temperature source Oral, resp. rate (!) 24, height 5\' 11"  (1.803 m), weight 111.7 kg, SpO2 100%.

## 2023-03-30 NOTE — Plan of Care (Signed)

## 2023-03-30 NOTE — Progress Notes (Signed)
DAILY PROGRESS NOTE   Patient Name: Timothy Palmer Date of Encounter: 03/30/2023 Cardiologist: Little Ishikawa, MD  Chief Complaint   Intubated, sedated on vent  Patient Profile   57 yo male with history of MSSA bacterial endocarditis, s/p MV repair, mild aortic valve stenosis, ESRD on HD, PAD and history of septic emboli, presents with myalgias, fatigue, and poor po intake, found to be in shock   Subjective   Significant decompensation yesterday - noted to be in a wide complex tachycardia with short cycle length, presumably VT - successfully cardioverted and given amiodarone. Overnight, has had PVC's, sometimes in bigeminy. EKG today shows NSR with PVC's - noted a long QTC of 612 msec.  Has been on heparin. Troponin is up and down between 1400 and 1600. Not clear if this represents CAD or right heart failure. He is now on CRRT.  Magnesium 2.5 today - was hyperkalemic to 6.9 maximum.  Objective   Vitals:   03/30/23 0900 03/30/23 0915 03/30/23 0930 03/30/23 0945  BP:      Pulse: 61 (!) 55 60 60  Resp: (!) 26 (!) 24 (!) 24 (!) 23  Temp:      TempSrc:      SpO2: 100% 100% 100% 100%  Weight:      Height:        Intake/Output Summary (Last 24 hours) at 03/30/2023 0959 Last data filed at 03/30/2023 0900 Gross per 24 hour  Intake 2347.03 ml  Output 1605.2 ml  Net 741.83 ml   Filed Weights   03/29/23 0736 03/29/23 1254 03/30/23 0327  Weight: 108.9 kg 114.1 kg 111.7 kg    Physical Exam   General appearance: intubated, sedated on vent Neck: JVD - several cm above sternal notch, no carotid bruit, and thyroid not enlarged, symmetric, no tenderness/mass/nodules Lungs: diminished breath sounds bilaterally Heart: regular rate and rhythm and systolic murmur: late systolic 3/6, crescendo at 2nd right intercostal space Abdomen: obese, protuberant Extremities: edema 2+ and venous stasis dermatitis noted Pulses: 2+ and symmetric Skin: Skin color, texture, turgor normal. No  rashes or lesions Neurologic: Mental status: intubated, sedate on vent Psych: Cannot assess  Inpatient Medications    Scheduled Meds:  [START ON 04/01/2023] calcitRIOL  0.75 mcg Per Tube Q T,Th,Sat-1800   Chlorhexidine Gluconate Cloth  6 each Topical Daily   cinacalcet  150 mg Oral Q T,Th,Sat-1800   docusate  100 mg Per Tube BID   hydrocortisone sod succinate (SOLU-CORTEF) inj  100 mg Intravenous Q8H   mouth rinse  15 mL Mouth Rinse Q2H   pantoprazole (PROTONIX) IV  40 mg Intravenous Daily   polyethylene glycol  17 g Per Tube Daily    Continuous Infusions:  calcium gluconate 2,000 mg (03/30/23 0955)   ceFEPime (MAXIPIME) IV Stopped (03/30/23 0748)   dextrose Stopped (03/29/23 1046)   fentaNYL infusion INTRAVENOUS 175 mcg/hr (03/30/23 0900)   heparin 1,650 Units/hr (03/30/23 0900)   norepinephrine (LEVOPHED) Adult infusion 4 mcg/min (03/30/23 0900)   prismasol BGK 0/2.5 400 mL/hr at 03/30/23 0934   prismasol BGK 0/2.5 400 mL/hr at 03/30/23 0936   prismasol BGK 0/2.5 1,500 mL/hr at 03/30/23 1610   vancomycin     vasopressin 0.04 Units/min (03/30/23 0900)    PRN Meds: docusate, fentaNYL, heparin, midazolam, mouth rinse, polyethylene glycol   Labs   Results for orders placed or performed during the hospital encounter of 03/29/23 (from the past 48 hour(s))  CBG monitoring, ED     Status:  Abnormal   Collection Time: 03/29/23  7:43 AM  Result Value Ref Range   Glucose-Capillary 15 (LL) 70 - 99 mg/dL    Comment: Glucose reference range applies only to samples taken after fasting for at least 8 hours.   Comment 1 Notify RN   SARS Coronavirus 2 by RT PCR (hospital order, performed in [facility] hospital lab) *cepheid single result test* Anterior Nasal Swab     Status: None   Collection Time: 03/29/23  7:56 AM   Specimen: Anterior Nasal Swab  Result Value Ref Range   SARS Coronavirus 2 by RT PCR NEGATIVE NEGATIVE    Comment: Performed at [facility] Lab, [address],  [place], [place] [address]  CBC with Differential     Status: Abnormal   Collection Time: 03/29/23  8:00 AM  Result Value Ref Range   WBC 22.6 (H) 4.0 - 10.5 K/uL   RBC 4.32 4.22 - 5.81 MIL/uL   Hemoglobin 12.2 (L) 13.0 - 17.0 g/dL   HCT 18.8 (L) 41.6 - 60.6 %   MCV 88.9 80.0 - 100.0 fL   MCH 28.2 26.0 - 34.0 pg   MCHC 31.8 30.0 - 36.0 g/dL   RDW 30.1 (H) 60.1 - 09.3 %   Platelets 236 150 - 400 K/uL   nRBC 0.3 (H) 0.0 - 0.2 %   Neutrophils Relative % 88 %   Neutro Abs 20.0 (H) 1.7 - 7.7 K/uL   Lymphocytes Relative 4 %   Lymphs Abs 1.0 0.7 - 4.0 K/uL   Monocytes Relative 5 %   Monocytes Absolute 1.1 (H) 0.1 - 1.0 K/uL   Eosinophils Relative 1 %   Eosinophils Absolute 0.2 0.0 - 0.5 K/uL   Basophils Relative 1 %   Basophils Absolute 0.1 0.0 - 0.1 K/uL   Immature Granulocytes 1 %   Abs Immature Granulocytes 0.25 (H) 0.00 - 0.07 K/uL    Comment: Performed at [facility], [address] [place], [place] [address]  Troponin I (High Sensitivity)     Status: Abnormal   Collection Time: 03/29/23  8:00 AM  Result Value Ref Range   Troponin I (High Sensitivity) 962 (HH) <18 ng/L    Comment: CRITICAL RESULT CALLED TO Jess Barters, RN, READ BACK BY AND VERIFIED WITH Malissa Hippo [phone] ON 9.21.2024 (NOTE) Elevated high sensitivity troponin I (hsTnI) values and significant  changes across serial measurements may suggest ACS but many other  chronic and acute conditions are known to elevate hsTnI results.  Refer to the "Links" section for chest pain algorithms and additional  guidance. Performed at [facility] Lab, [address] [place], [place] [address]   Blood culture (routine x 2)     Status: None (Preliminary result)   Collection Time: 03/29/23  8:08 AM   Specimen: BLOOD  Result Value Ref Range   Specimen Description BLOOD RIGHT ANTECUBITAL    Special Requests      BOTTLES DRAWN AEROBIC AND ANAEROBIC Blood Culture adequate volume   Culture      NO GROWTH < 24 HOURS Performed  at [facility], [address], [place], [place] [address]    Report Status PENDING   I-Stat CG4 Lactic Acid, ED     Status: Abnormal   Collection Time: 03/29/23  8:32 AM  Result Value Ref Range   Lactic Acid, Venous 11.2 (HH) 0.5 - 1.9 mmol/L   Comment NOTIFIED PHYSICIAN   CBG monitoring, ED  Status: Abnormal   Collection Time: 03/29/23  8:44 AM  Result Value Ref Range   Glucose-Capillary 55 (L) 70 - 99 mg/dL    Comment: Glucose reference range applies only to samples taken after fasting for at least 8 hours.  Blood culture (routine x 2)     Status: None (Preliminary result)   Collection Time: 03/29/23  8:47 AM   Specimen: BLOOD RIGHT HAND  Result Value Ref Range   Specimen Description BLOOD RIGHT HAND    Special Requests      BOTTLES DRAWN AEROBIC AND ANAEROBIC Blood Culture adequate volume   Culture      NO GROWTH < 24 HOURS Performed at [facility], [address], [place], [place] [address]    Report Status PENDING   Lactic acid, plasma     Status: Abnormal   Collection Time: 03/29/23  8:48 AM  Result Value Ref Range   Lactic Acid, Venous >9.0 (HH) 0.5 - 1.9 mmol/L    Comment: CRITICAL RESULT CALLED TO Jess Barters, RN, READ BACK BY AND VERIFIED WITH Malissa Hippo [phone] ON 9.21.2024 Performed at [facility], [address], [place], [place] [address]   CBG monitoring, ED     Status: Abnormal   Collection Time: 03/29/23  9:09 AM  Result Value Ref Range   Glucose-Capillary 227 (H) 70 - 99 mg/dL    Comment: Glucose reference range applies only to samples taken after fasting for at least 8 hours.  Ethanol     Status: None   Collection Time: 03/29/23  9:27 AM  Result Value Ref Range   Alcohol, Ethyl (B) <10 <10 mg/dL    Comment: (NOTE) Lowest detectable limit for serum alcohol is 10 mg/dL.  For medical purposes only. Performed at [facility] Lab, [address] [place], [place] [address]   Troponin I (High Sensitivity)     Status: Abnormal    Collection Time: 03/29/23  9:27 AM  Result Value Ref Range   Troponin I (High Sensitivity) 872 (HH) <18 ng/L    Comment: CRITICAL RESULT CALLED TO, READ BACK BY AND VERIFIED WITH Normand Sloop RN AT [phone] BY D LONG (NOTE) Elevated high sensitivity troponin I (hsTnI) values and significant  changes across serial measurements may suggest ACS but many other  chronic and acute conditions are known to elevate hsTnI results.  Refer to the "Links" section for chest pain algorithms and additional  guidance. Performed at [facility] Lab, [address], [place], [place] [address]   Phosphorus     Status: Abnormal   Collection Time: 03/29/23  9:27 AM  Result Value Ref Range   Phosphorus 9.8 (H) 2.5 - 4.6 mg/dL    Comment: Performed at [facility] Lab, [address] [place], [place] [address]  CBG monitoring, ED     Status: None   Collection Time: 03/29/23  9:54 AM  Result Value Ref Range   Glucose-Capillary 75 70 - 99 mg/dL    Comment: Glucose reference range applies only to samples taken after fasting for at least 8 hours.  Lactic acid, plasma     Status: Abnormal   Collection Time: 03/29/23  9:55 AM  Result Value Ref Range   Lactic Acid, Venous >9.0 (HH) 0.5 - 1.9 mmol/L    Comment: CRITICAL VALUE NOTED. VALUE IS CONSISTENT WITH PREVIOUSLY REPORTED/CALLED VALUE Performed at [facility] Lab, [address], [place], [place] [address]   Comprehensive metabolic panel  Status: Abnormal   Collection Time: 03/29/23 10:13 AM  Result Value Ref Range   Sodium 135 135 - 145 mmol/L   Potassium 5.9 (H) 3.5 - 5.1 mmol/L   Chloride 89 (L) 98 - 111 mmol/L   CO2 17 (L) 22 - 32 mmol/L   Glucose, Bld 64 (L) 70 - 99 mg/dL    Comment: Glucose reference range applies only to samples taken after fasting for at least 8 hours.   BUN 70 (H) 6 - 20 mg/dL   Creatinine, Ser 13.24 (H) 0.61 - 1.24 mg/dL   Calcium 7.0 (L) 8.9 - 10.3 mg/dL   Total Protein 7.6 6.5 - 8.1 g/dL   Albumin 2.2 (L) 3.5 -  5.0 g/dL   AST 401 (H) 15 - 41 U/L   ALT 96 (H) 0 - 44 U/L   Alkaline Phosphatase 165 (H) 38 - 126 U/L   Total Bilirubin 8.6 (H) 0.3 - 1.2 mg/dL   GFR, Estimated 6 (L) >60 mL/min    Comment: (NOTE) Calculated using the CKD-EPI Creatinine Equation (2021)    Anion gap 29 (H) 5 - 15    Comment: ELECTROLYTES REPEATED TO VERIFY Performed at [facility] Lab, [address] [place], [place] [address]   Magnesium     Status: Abnormal   Collection Time: 03/29/23 10:13 AM  Result Value Ref Range   Magnesium 2.6 (H) 1.7 - 2.4 mg/dL    Comment: Performed at [facility] Lab, [address], [place], [place] [address]  CBG monitoring, ED     Status: None   Collection Time: 03/29/23 11:00 AM  Result Value Ref Range   Glucose-Capillary 72 70 - 99 mg/dL    Comment: Glucose reference range applies only to samples taken after fasting for at least 8 hours.  I-Stat venous blood gas, ([facility] ED, [facility], [facility])     Status: Abnormal   Collection Time: 03/29/23 11:18 AM  Result Value Ref Range   pH, Ven 7.304 7.25 - 7.43   pCO2, Ven 43.1 (L) 44 - 60 mmHg   pO2, Ven 52 (H) 32 - 45 mmHg   Bicarbonate 21.4 20.0 - 28.0 mmol/L   TCO2 23 22 - 32 mmol/L   O2 Saturation 82 %   Acid-base deficit 5.0 (H) 0.0 - 2.0 mmol/L   Sodium 134 (L) 135 - 145 mmol/L   Potassium 6.4 (HH) 3.5 - 5.1 mmol/L   Calcium, Ion 0.73 (LL) 1.15 - 1.40 mmol/L   HCT 38.0 (L) 39.0 - 52.0 %   Hemoglobin 12.9 (L) 13.0 - 17.0 g/dL   Sample type VENOUS    Comment NOTIFIED PHYSICIAN   I-stat chem 8, ED (not at [facility], [facility] or [facility])     Status: Abnormal   Collection Time: 03/29/23 11:19 AM  Result Value Ref Range   Sodium 133 (L) 135 - 145 mmol/L   Potassium 6.3 (HH) 3.5 - 5.1 mmol/L   Chloride 97 (L) 98 - 111 mmol/L   BUN 80 (H) 6 - 20 mg/dL   Creatinine, Ser 03.47 (H) 0.61 - 1.24 mg/dL   Glucose, Bld 68 (L) 70 - 99 mg/dL    Comment: Glucose reference range applies only to samples taken after fasting for at least 8 hours.   Calcium, Ion  0.70 (LL) 1.15 - 1.40 mmol/L   TCO2 21 (L) 22 - 32 mmol/L   Hemoglobin 12.9 (L) 13.0 - 17.0 g/dL   HCT 42.5 (L) 95.6 - 38.7 %   Comment  NOTIFIED PHYSICIAN   Respiratory (~20 pathogens) panel by PCR     Status: None   Collection Time: 03/29/23 11:37 AM   Specimen: Nasopharyngeal Swab; Respiratory  Result Value Ref Range   Adenovirus NOT DETECTED NOT DETECTED   Coronavirus 229E NOT DETECTED NOT DETECTED    Comment: (NOTE) The Coronavirus on the Respiratory Panel, DOES NOT test for the novel  Coronavirus (2019 nCoV)    Coronavirus HKU1 NOT DETECTED NOT DETECTED   Coronavirus NL63 NOT DETECTED NOT DETECTED   Coronavirus OC43 NOT DETECTED NOT DETECTED   Metapneumovirus NOT DETECTED NOT DETECTED   Rhinovirus / Enterovirus NOT DETECTED NOT DETECTED   Influenza A NOT DETECTED NOT DETECTED   Influenza B NOT DETECTED NOT DETECTED   Parainfluenza Virus 1 NOT DETECTED NOT DETECTED   Parainfluenza Virus 2 NOT DETECTED NOT DETECTED   Parainfluenza Virus 3 NOT DETECTED NOT DETECTED   Parainfluenza Virus 4 NOT DETECTED NOT DETECTED   Respiratory Syncytial Virus NOT DETECTED NOT DETECTED   Bordetella pertussis NOT DETECTED NOT DETECTED   Bordetella Parapertussis NOT DETECTED NOT DETECTED   Chlamydophila pneumoniae NOT DETECTED NOT DETECTED   Mycoplasma pneumoniae NOT DETECTED NOT DETECTED    Comment: Performed at [facility] Lab, [address] [place], [place] [address]  HIV Antibody (routine testing w rflx)     Status: None   Collection Time: 03/29/23 11:58 AM  Result Value Ref Range   HIV Screen 4th Generation wRfx Non Reactive Non Reactive    Comment: Performed at [facility], [address], [place], [place] [address]  CBC     Status: Abnormal   Collection Time: 03/29/23 11:58 AM  Result Value Ref Range   WBC 22.1 (H) 4.0 - 10.5 K/uL   RBC 4.03 (L) 4.22 - 5.81 MIL/uL   Hemoglobin 11.6 (L) 13.0 - 17.0 g/dL   HCT 82.9 (L) 56.2 - 13.0 %   MCV 86.6 80.0 - 100.0 fL   MCH 28.8  26.0 - 34.0 pg   MCHC 33.2 30.0 - 36.0 g/dL   RDW 86.5 (H) 78.4 - 69.6 %   Platelets 224 150 - 400 K/uL    Comment: REPEATED TO VERIFY   nRBC 0.3 (H) 0.0 - 0.2 %    Comment: Performed at [facility] Lab, [address], [place], [place] [address]  Creatinine, serum     Status: Abnormal   Collection Time: 03/29/23 11:58 AM  Result Value Ref Range   Creatinine, Ser 10.11 (H) 0.61 - 1.24 mg/dL   GFR, Estimated 6 (L) >60 mL/min    Comment: (NOTE) Calculated using the CKD-EPI Creatinine Equation (2021) Performed at [facility] Lab, [address], [place], [place] [address]   Brain natriuretic peptide     Status: Abnormal   Collection Time: 03/29/23 11:58 AM  Result Value Ref Range   B Natriuretic Peptide 2,670.6 (H) 0.0 - 100.0 pg/mL    Comment: Performed at [facility], [address], [place], [place] [address]  Procalcitonin     Status: None   Collection Time: 03/29/23 11:58 AM  Result Value Ref Range   Procalcitonin 47.41 ng/mL    Comment:        Interpretation: PCT >= 10 ng/mL: Important systemic inflammatory response, almost exclusively due to severe bacterial sepsis or septic shock. (NOTE)       Sepsis PCT Algorithm           Lower Respiratory Tract  Infection PCT Algorithm    ----------------------------     ----------------------------         PCT < 0.25 ng/mL                PCT < 0.10 ng/mL          Strongly encourage             Strongly discourage   discontinuation of antibiotics    initiation of antibiotics    ----------------------------     -----------------------------       PCT 0.25 - 0.50 ng/mL            PCT 0.10 - 0.25 ng/mL               OR       >80% decrease in PCT            Discourage initiation of                                            antibiotics      Encourage discontinuation           of antibiotics    ----------------------------     -----------------------------         PCT >= 0.50 ng/mL               PCT 0.26 - 0.50 ng/mL                AND       <80% decrease in PCT             Encourage initiation of                                             antibiotics       Encourage continuation           of antibiotics    ----------------------------     -----------------------------        PCT >= 0.50 ng/mL                  PCT > 0.50 ng/mL               AND         increase in PCT                  Strongly encourage                                      initiation of antibiotics    Strongly encourage escalation           of antibiotics                                     -----------------------------                                           PCT <= 0.25 ng/mL  OR                                        > 80% decrease in PCT                                      Discontinue / Do not initiate                                             antibiotics  Performed at [facility], [address] [place], [place] [address]   Amylase     Status: None   Collection Time: 03/29/23 11:58 AM  Result Value Ref Range   Amylase 71 28 - 100 U/L    Comment: Performed at [facility] Lab, [address], [place], [place] [address]  Lipase, blood     Status: None   Collection Time: 03/29/23 11:58 AM  Result Value Ref Range   Lipase 41 11 - 51 U/L    Comment: Performed at [facility], [address] [place], [place] [address]  Protime-INR     Status: Abnormal   Collection Time: 03/29/23 11:58 AM  Result Value Ref Range   Prothrombin Time 20.8 (H) 11.4 - 15.2 seconds   INR 1.8 (H) 0.8 - 1.2    Comment: (NOTE) INR goal varies based on device and disease states. Performed at [facility], [address], [place], [place] [address]   Hemoglobin A1c     Status: Abnormal   Collection Time: 03/29/23 11:58 AM  Result Value Ref Range   Hgb A1c MFr Bld 5.7 (H) 4.8 - 5.6 %    Comment: (NOTE) Pre diabetes:           5.7%-6.4%  Diabetes:              >6.4%  Glycemic control for   <7.0% adults with diabetes    Mean Plasma Glucose 116.89 mg/dL    Comment: Performed at [facility] Lab, [address], [place], [place] [address]  Potassium     Status: Abnormal   Collection Time: 03/29/23 11:58 AM  Result Value Ref Range   Potassium 6.2 (H) 3.5 - 5.1 mmol/L    Comment: Performed at [facility] Lab, [address], [place], [place] [address]  MRSA Next Gen by PCR, Nasal     Status: None   Collection Time: 03/29/23 12:52 PM   Specimen: Nasal Mucosa; Nasal Swab  Result Value Ref Range   MRSA by PCR Next Gen NOT DETECTED NOT DETECTED    Comment: (NOTE) The GeneXpert MRSA Assay (FDA approved for NASAL specimens only), is one component of a comprehensive MRSA colonization surveillance program. It is not intended to diagnose MRSA infection nor to guide or monitor treatment for MRSA infections. Test performance is not FDA approved in patients less than 62 years old. Performed at [facility] Lab, [address] [place], [place] [address]   Glucose, capillary     Status: None   Collection Time: 03/29/23  1:00 PM  Result Value Ref Range   Glucose-Capillary 70 70 - 99 mg/dL    Comment: Glucose reference  range applies only to samples taken after fasting for at least 8 hours.  Troponin I (High Sensitivity)     Status: Abnormal   Collection Time: 03/29/23  1:50 PM  Result Value Ref Range   Troponin I (High Sensitivity) 1,171 (HH) <18 ng/L    Comment: CRITICAL VALUE NOTED. VALUE IS CONSISTENT WITH PREVIOUSLY REPORTED/CALLED VALUE (NOTE) Elevated high sensitivity troponin I (hsTnI) values and significant  changes across serial measurements may suggest ACS but many other  chronic and acute conditions are known to elevate hsTnI results.  Refer to the "Links" section for chest pain algorithms and additional  guidance. Performed at [facility] Lab, [address] [place], [place] [address]    Glucose, capillary     Status: Abnormal   Collection Time: 03/29/23  3:17 PM  Result Value Ref Range   Glucose-Capillary 69 (L) 70 - 99 mg/dL    Comment: Glucose reference range applies only to samples taken after fasting for at least 8 hours.  Glucose, capillary     Status: None   Collection Time: 03/29/23  3:44 PM  Result Value Ref Range   Glucose-Capillary 79 70 - 99 mg/dL    Comment: Glucose reference range applies only to samples taken after fasting for at least 8 hours.  Lactic acid, plasma     Status: Abnormal   Collection Time: 03/29/23  4:55 PM  Result Value Ref Range   Lactic Acid, Venous >9.0 (HH) 0.5 - 1.9 mmol/L    Comment: CRITICAL VALUE NOTED. VALUE IS CONSISTENT WITH PREVIOUSLY REPORTED/CALLED VALUE Performed at [facility], [address], [place], [place] [address]   Lactic acid, plasma     Status: Abnormal   Collection Time: 03/29/23  7:12 PM  Result Value Ref Range   Lactic Acid, Venous >9.0 (HH) 0.5 - 1.9 mmol/L    Comment: CRITICAL VALUE NOTED. VALUE IS CONSISTENT WITH PREVIOUSLY REPORTED/CALLED VALUE Performed at [facility], [address], [place], [place] [address]   Troponin I (High Sensitivity)     Status: Abnormal   Collection Time: 03/29/23  7:12 PM  Result Value Ref Range   Troponin I (High Sensitivity) 1,425 (HH) <18 ng/L    Comment: CRITICAL VALUE NOTED. VALUE IS CONSISTENT WITH PREVIOUSLY REPORTED/CALLED VALUE (NOTE) Elevated high sensitivity troponin I (hsTnI) values and significant  changes across serial measurements may suggest ACS but many other  chronic and acute conditions are known to elevate hsTnI results.  Refer to the "Links" section for chest pain algorithms and additional  guidance. Performed at [facility] Lab, [address], [place], [place] [address]   Basic metabolic panel     Status: Abnormal   Collection Time: 03/29/23  7:12 PM  Result Value Ref Range   Sodium 136 135 - 145 mmol/L   Potassium 6.9 (HH)  3.5 - 5.1 mmol/L    Comment: CRITICAL RESULT CALLED TO, READ BACK BY AND VERIFIED WITH C,JONES RN @2015  03/29/23 E,[place]   Chloride 89 (L) 98 - 111 mmol/L   CO2 14 (L) 22 - 32 mmol/L   Glucose, Bld 67 (L) 70 - 99 mg/dL    Comment: Glucose reference range applies only to samples taken after fasting for at least 8 hours.   BUN 79 (H) 6 - 20 mg/dL   Creatinine, Ser 30.16 (H) 0.61 - 1.24 mg/dL   Calcium 7.9 (L) 8.9 - 10.3 mg/dL   GFR, Estimated 5 (L) >60 mL/min    Comment: (  NOTE) Calculated using the CKD-EPI Creatinine Equation (2021)    Anion gap 33 (H) 5 - 15    Comment: ELECTROLYTES REPEATED TO VERIFY Performed at [facility] Lab, [address], [place], [place] [address]   Magnesium     Status: Abnormal   Collection Time: 03/29/23  7:12 PM  Result Value Ref Range   Magnesium 2.9 (H) 1.7 - 2.4 mg/dL    Comment: Performed at [facility] Lab, [address], [place], [place] [address]  CBC     Status: Abnormal   Collection Time: 03/29/23  7:12 PM  Result Value Ref Range   WBC 23.6 (H) 4.0 - 10.5 K/uL   RBC 4.14 (L) 4.22 - 5.81 MIL/uL   Hemoglobin 11.8 (L) 13.0 - 17.0 g/dL   HCT 27.2 (L) 53.6 - 64.4 %   MCV 90.8 80.0 - 100.0 fL   MCH 28.5 26.0 - 34.0 pg   MCHC 31.4 30.0 - 36.0 g/dL   RDW 03.4 (H) 74.2 - 59.5 %   Platelets 227 150 - 400 K/uL   nRBC 0.6 (H) 0.0 - 0.2 %    Comment: Performed at [facility] Lab, [address] [place], [place] [address]  Glucose, capillary     Status: Abnormal   Collection Time: 03/29/23  7:23 PM  Result Value Ref Range   Glucose-Capillary 51 (L) 70 - 99 mg/dL    Comment: Glucose reference range applies only to samples taken after fasting for at least 8 hours.  Lactic acid, plasma     Status: Abnormal   Collection Time: 03/29/23  9:14 PM  Result Value Ref Range   Lactic Acid, Venous 6.7 (HH) 0.5 - 1.9 mmol/L    Comment: CRITICAL VALUE NOTED. VALUE IS CONSISTENT WITH PREVIOUSLY REPORTED/CALLED VALUE Performed at [facility]  Lab, [address], [place], [place] [address]   Glucose, capillary     Status: Abnormal   Collection Time: 03/29/23  9:16 PM  Result Value Ref Range   Glucose-Capillary 145 (H) 70 - 99 mg/dL    Comment: Glucose reference range applies only to samples taken after fasting for at least 8 hours.  I-STAT 7, (LYTES, BLD GAS, ICA, H+H)     Status: Abnormal   Collection Time: 03/29/23  9:20 PM  Result Value Ref Range   pH, Arterial 7.367 7.35 - 7.45   pCO2 arterial 34.9 32 - 48 mmHg   pO2, Arterial 437 (H) 83 - 108 mmHg   Bicarbonate 20.0 20.0 - 28.0 mmol/L   TCO2 21 (L) 22 - 32 mmol/L   O2 Saturation 100 %   Acid-base deficit 5.0 (H) 0.0 - 2.0 mmol/L   Sodium 133 (L) 135 - 145 mmol/L   Potassium 6.5 (HH) 3.5 - 5.1 mmol/L   Calcium, Ion 0.72 (LL) 1.15 - 1.40 mmol/L   HCT 38.0 (L) 39.0 - 52.0 %   Hemoglobin 12.9 (L) 13.0 - 17.0 g/dL   Sample type ARTERIAL    Comment NOTIFIED PHYSICIAN   Troponin I (High Sensitivity)     Status: Abnormal   Collection Time: 03/29/23 10:14 PM  Result Value Ref Range   Troponin I (High Sensitivity) 1,143 (HH) <18 ng/L    Comment: CRITICAL VALUE NOTED. VALUE IS CONSISTENT WITH PREVIOUSLY REPORTED/CALLED VALUE (NOTE) Elevated high sensitivity troponin I (hsTnI) values and significant  changes across serial measurements may suggest ACS but many other  chronic and acute conditions are known to elevate hsTnI results.  Refer  to the "Links" section for chest pain algorithms and additional  guidance. Performed at [facility], [address], [place], [place] [address]   APTT     Status: Abnormal   Collection Time: 03/29/23 10:14 PM  Result Value Ref Range   aPTT 69 (H) 24 - 36 seconds    Comment:        IF BASELINE aPTT IS ELEVATED, SUGGEST PATIENT RISK ASSESSMENT BE USED TO DETERMINE APPROPRIATE ANTICOAGULANT THERAPY. Performed at [facility], [address] [place], [place] [address]   Heparin level (unfractionated)     Status: Abnormal    Collection Time: 03/29/23 10:14 PM  Result Value Ref Range   Heparin Unfractionated 0.25 (L) 0.30 - 0.70 IU/mL    Comment: (NOTE) The clinical reportable range upper limit is being lowered to >1.10 to align with the FDA approved guidance for the current laboratory assay.  If heparin results are below expected values, and patient dosage has  been confirmed, suggest follow up testing of antithrombin III levels. Performed at [facility], [address] [place], [place] [address]   Potassium     Status: Abnormal   Collection Time: 03/29/23 10:14 PM  Result Value Ref Range   Potassium 6.7 (HH) 3.5 - 5.1 mmol/L    Comment: CRITICAL RESULT CALLED TO, READ BACK BY AND VERIFIED WITH Starla Link, RN AT 23:26 09.21.24 JLASIGAN Performed at [facility], [address], [place], [place] [address]   Glucose, capillary     Status: Abnormal   Collection Time: 03/29/23 10:20 PM  Result Value Ref Range   Glucose-Capillary 149 (H) 70 - 99 mg/dL    Comment: Glucose reference range applies only to samples taken after fasting for at least 8 hours.  Troponin I (High Sensitivity)     Status: Abnormal   Collection Time: 03/29/23 11:52 PM  Result Value Ref Range   Troponin I (High Sensitivity) 1,293 (HH) <18 ng/L    Comment: CRITICAL VALUE NOTED. VALUE IS CONSISTENT WITH PREVIOUSLY REPORTED/CALLED VALUE (NOTE) Elevated high sensitivity troponin I (hsTnI) values and significant  changes across serial measurements may suggest ACS but many other  chronic and acute conditions are known to elevate hsTnI results.  Refer to the "Links" section for chest pain algorithms and additional  guidance. Performed at [facility], [address], [place], [place] [address]   Glucose, capillary     Status: Abnormal   Collection Time: 03/29/23 11:54 PM  Result Value Ref Range   Glucose-Capillary 140 (H) 70 - 99 mg/dL    Comment: Glucose reference range applies only to samples taken after fasting for  at least 8 hours.  CBC     Status: Abnormal   Collection Time: 03/30/23  1:34 AM  Result Value Ref Range   WBC 22.2 (H) 4.0 - 10.5 K/uL   RBC 4.00 (L) 4.22 - 5.81 MIL/uL   Hemoglobin 11.6 (L) 13.0 - 17.0 g/dL   HCT 75.1 (L) 02.5 - 85.2 %   MCV 81.8 80.0 - 100.0 fL    Comment: REPEATED TO VERIFY DELTA CHECK NOTED    MCH 29.0 26.0 - 34.0 pg   MCHC 35.5 30.0 - 36.0 g/dL   RDW 77.8 (H) 24.2 - 35.3 %   Platelets 233 150 - 400 K/uL   nRBC 0.6 (H) 0.0 - 0.2 %    Comment: Performed at [facility], [address] [place], [place] [address]  Lactic acid, plasma     Status: Abnormal   Collection Time: 03/30/23  1:34 AM  Result Value Ref Range   Lactic Acid, Venous 4.8 (HH) 0.5 - 1.9 mmol/L    Comment: CRITICAL VALUE NOTED. VALUE IS CONSISTENT WITH PREVIOUSLY REPORTED/CALLED VALUE Performed at [facility], [address], [place], [place] [address]   Magnesium     Status: Abnormal   Collection Time: 03/30/23  1:34 AM  Result Value Ref Range   Magnesium 2.5 (H) 1.7 - 2.4 mg/dL    Comment: Performed at [facility], [address] [place], [place] [address]  Troponin I (High Sensitivity)     Status: Abnormal   Collection Time: 03/30/23  1:34 AM  Result Value Ref Range   Troponin I (High Sensitivity) 1,254 (HH) <18 ng/L    Comment: CRITICAL VALUE NOTED. VALUE IS CONSISTENT WITH PREVIOUSLY REPORTED/CALLED VALUE (NOTE) Elevated high sensitivity troponin I (hsTnI) values and significant  changes across serial measurements may suggest ACS but many other  chronic and acute conditions are known to elevate hsTnI results.  Refer to the "Links" section for chest pain algorithms and additional  guidance. Performed at [facility], [address], [place], [place] [address]   APTT     Status: Abnormal   Collection Time: 03/30/23  2:05 AM  Result Value Ref Range   aPTT 67 (H) 24 - 36 seconds    Comment:        IF BASELINE aPTT IS ELEVATED, SUGGEST PATIENT RISK  ASSESSMENT BE USED TO DETERMINE APPROPRIATE ANTICOAGULANT THERAPY. Performed at [facility], [address] [place], [place] [address]   Heparin level (unfractionated)     Status: Abnormal   Collection Time: 03/30/23  3:12 AM  Result Value Ref Range   Heparin Unfractionated 0.22 (L) 0.30 - 0.70 IU/mL    Comment: (NOTE) The clinical reportable range upper limit is being lowered to >1.10 to align with the FDA approved guidance for the current laboratory assay.  If heparin results are below expected values, and patient dosage has  been confirmed, suggest follow up testing of antithrombin III levels. Performed at [facility], [address], [place], [place] [address]   Renal function panel (daily at 0500)     Status: Abnormal   Collection Time: 03/30/23  3:12 AM  Result Value Ref Range   Sodium 134 (L) 135 - 145 mmol/L   Potassium 6.0 (H) 3.5 - 5.1 mmol/L   Chloride 94 (L) 98 - 111 mmol/L   CO2 19 (L) 22 - 32 mmol/L   Glucose, Bld 133 (H) 70 - 99 mg/dL    Comment: Glucose reference range applies only to samples taken after fasting for at least 8 hours.   BUN 63 (H) 6 - 20 mg/dL   Creatinine, Ser 9.62 (H) 0.61 - 1.24 mg/dL   Calcium 6.6 (L) 8.9 - 10.3 mg/dL   Phosphorus 7.0 (H) 2.5 - 4.6 mg/dL   Albumin 2.1 (L) 3.5 - 5.0 g/dL   GFR, Estimated 8 (L) >60 mL/min    Comment: (NOTE) Calculated using the CKD-EPI Creatinine Equation (2021)    Anion gap 21 (H) 5 - 15    Comment: ELECTROLYTES REPEATED TO VERIFY Performed at [facility], [address] [place], [place] [address]   Glucose, capillary     Status: Abnormal   Collection Time: 03/30/23  3:16 AM  Result Value Ref Range   Glucose-Capillary  138 (H) 70 - 99 mg/dL    Comment: Glucose reference range applies only to samples taken after fasting for at least 8 hours.  I-STAT 7, (LYTES, BLD GAS, ICA, H+H)     Status: Abnormal   Collection Time: 03/30/23  3:59 AM  Result Value Ref Range   pH, Arterial 7.480  (H) 7.35 - 7.45   pCO2 arterial 32.0 32 - 48 mmHg   pO2, Arterial 285 (H) 83 - 108 mmHg   Bicarbonate 23.9 20.0 - 28.0 mmol/L   TCO2 25 22 - 32 mmol/L   O2 Saturation 100 %   Acid-Base Excess 1.0 0.0 - 2.0 mmol/L   Sodium 134 (L) 135 - 145 mmol/L   Potassium 6.0 (H) 3.5 - 5.1 mmol/L   Calcium, Ion 0.75 (LL) 1.15 - 1.40 mmol/L   HCT 37.0 (L) 39.0 - 52.0 %   Hemoglobin 12.6 (L) 13.0 - 17.0 g/dL   Sample type ARTERIAL    Comment NOTIFIED PHYSICIAN   Troponin I (High Sensitivity)     Status: Abnormal   Collection Time: 03/30/23  5:55 AM  Result Value Ref Range   Troponin I (High Sensitivity) 1,601 (HH) <18 ng/L    Comment: CRITICAL VALUE NOTED. VALUE IS CONSISTENT WITH PREVIOUSLY REPORTED/CALLED VALUE (NOTE) Elevated high sensitivity troponin I (hsTnI) values and significant  changes across serial measurements may suggest ACS but many other  chronic and acute conditions are known to elevate hsTnI results.  Refer to the "Links" section for chest pain algorithms and additional  guidance. Performed at [facility], [address] [place], [place] [address]   Glucose, capillary     Status: Abnormal   Collection Time: 03/30/23  7:11 AM  Result Value Ref Range   Glucose-Capillary 132 (H) 70 - 99 mg/dL    Comment: Glucose reference range applies only to samples taken after fasting for at least 8 hours.    ECG   NSR with PVC's, QTc 612 msec - Personally Reviewed  Telemetry   NSR with PVCs, VT overnight with DCCV - Personally Reviewed  Radiology    DG Chest Port 1 View  Result Date: 03/30/2023 CLINICAL DATA:  57 year old male with history of dyspnea. EXAM: PORTABLE CHEST 1 VIEW COMPARISON:  Chest x-ray 03/29/2023. FINDINGS: An endotracheal tube is in place with tip 5.3 cm above the carina. There is a right-sided internal jugular central venous catheter with tip terminating in the proximal superior vena cava. A nasogastric tube is seen extending into the stomach, however, the  tip of the nasogastric tube extends below the lower margin of the image. Transcutaneous defibrillator pads project over the right hemithorax and left upper quadrant of the abdomen. Lung volumes are low. Patchy areas of interstitial prominence an ill-defined opacities are noted in both lungs, most evident in the right upper lobe and left base. No pleural effusions. No pneumothorax. No evidence of pulmonary edema. Heart size is normal. The patient is rotated to the left on today's exam, resulting in distortion of the mediastinal contours and reduced diagnostic sensitivity and specificity for mediastinal pathology. Mitral annuloplasty ring or bioprosthetic valve noted. Atherosclerotic calcifications are noted in the thoracic aorta. IMPRESSION: 1. Support apparatus, as above. 2. Patchy areas of interstitial prominence an ill-defined opacities concerning for developing multilobar bilateral bronchopneumonia. 3. Aortic atherosclerosis. Electronically Signed   By: Trudie Reed M.D.   On: 03/30/2023 05:49   DG Abd Portable 1V  Result Date: 03/29/2023 CLINICAL DATA:  Orogastric tube placement EXAM: PORTABLE  ABDOMEN - 1 VIEW COMPARISON:  03/29/2023 FINDINGS: Orogastric tube tip within the expected distal body of the stomach. Normal abdominal gas pattern. No free intraperitoneal gas. Patchy pulmonary infiltrates noted within the visualized lung bases bilaterally. IMPRESSION: 1. Orogastric tube tip within the expected distal body of the stomach. Electronically Signed   By: Helyn Numbers M.D.   On: 03/29/2023 22:14   DG Abd 1 View  Result Date: 03/29/2023 CLINICAL DATA:  [id number]. Encounter for imaging study to confirm orogastric tube placement. EXAM: ABDOMEN - 1 VIEW COMPARISON:  Earlier study today at 6:44 p.m. FINDINGS: 7:45 p.m. The pelvis is not included in the study. Interval new NGT insertion. The tip is in the upper stomach with the side hole at the GE junction and needs to be advanced further in 10-12 cm.  Small bowel dilatation continues in the upper abdomen, maximum caliber 4.3 cm, again concerning for small bowel obstruction or worsening ileus since the CT from this morning. Small bowel dilatation was greater in the lower abdomen on the last film. This area is not evaluated. Electrical pads overlie the lower chest and there is overlying wiring. No supine evidence of free air. IMPRESSION: 1. NGT tip is in the upper stomach with the side hole at the GE junction and needs to be advanced further in 10-12 cm. 2. Persistent small bowel dilatation, maximum upper abdominal caliber 4.3 cm, concerning for small bowel obstruction or worsening ileus. Electronically Signed   By: Almira Bar M.D.   On: 03/29/2023 20:30   DG Abd 1 View  Result Date: 03/29/2023 CLINICAL DATA:  [id number] with abdominal distention. EXAM: ABDOMEN - 1 VIEW COMPARISON:  CT without contrast today at 9:23 a.m. FINDINGS: Generalized worsening small-bowel dilatation, maximum caliber was previously 2.7 cm now 5.5 cm worrisome for obstruction or worsening severe ileus. Scattered colonic aeration remains present at least to the distal descending colon. A rim calcified right renal cyst again superimposes over the lateral right iliac crest. There are vascular and vasa deferentia calcifications. No supine evidence of free air. No acute osseous findings. IMPRESSION: Generalized worsening small-bowel dilatation, maximum caliber was previously 2.7 cm now 5.5 cm. Findings are worrisome for small bowel obstruction or worsening severe ileus. Electronically Signed   By: Almira Bar M.D.   On: 03/29/2023 20:24   DG Chest Port 1 View  Result Date: 03/29/2023 CLINICAL DATA:  [id number].  History of ETT. EXAM: PORTABLE CHEST 1 VIEW COMPARISON:  Earlier study today at 6:40 p.m. FINDINGS: 7:23 p.m. there is overlying wiring and newly noted electrical pads. Interval intubation with ETT tip 2.8 cm from carina. Right IJ catheter interval pullback to the junction of the  right IJ/subclavian vein, previously in the upper SVC. Stable cardiomegaly with AVR. Central vessels are normal caliber. Low lung volumes. Increased scattered heterogeneous opacities left mid to lower lung fields which could be atelectasis or pneumonia. Small right pleural effusion again noted with elevated hemidiaphragm, right mid perihilar linear atelectasis. Stable mediastinum with widening superiorly, aortic atherosclerosis. No new osseous finding.  In all other respects no further changes. IMPRESSION: 1. Interval intubation with ETT tip 2.8 cm from carina. 2. Right IJ catheter interval pullback to the junction of the right IJ/subclavian vein. 3. Increased left mid to lower lung heterogeneous opacities which could be atelectasis or pneumonia. 4. Unchanged small right pleural effusion.  Cardiomegaly. Electronically Signed   By: Almira Bar M.D.   On: 03/29/2023 20:18   DG CHEST PORT 1 VIEW  Result Date:  03/29/2023 CLINICAL DATA:  [id number] Encounter for central line placement [id number] EXAM: PORTABLE CHEST 1 VIEW COMPARISON:  03/29/2023 FINDINGS: Interval placement of right internal jugular approach central venous catheter with distal tip terminating at the level of the mid SVC. Stable cardiomegaly. Aortic atherosclerosis. Low lung volumes with elevation of the right hemidiaphragm. Similar bilateral interstitial prominence. No pneumothorax. IMPRESSION: Interval placement of right internal jugular approach central venous catheter with distal tip terminating at the level of the mid SVC. No pneumothorax. Electronically Signed   By: Duanne Guess D.O.   On: 03/29/2023 19:33   ECHOCARDIOGRAM COMPLETE  Result Date: 03/29/2023    ECHOCARDIOGRAM REPORT   Patient Name:   Timothy Palmer [facility] Date of Exam: 03/29/2023 Medical Rec #:  [id number]      Height:       71.0 in Accession #:    [id number]     Weight:       240.0 lb Date of Birth:  Oct 09, 1965     BSA:          2.278 m Patient Age:    56 years       BP:            90/63 mmHg Patient Gender: M              HR:           63 bpm. Exam Location:  Inpatient Procedure: 2D Echo, Color Doppler and Cardiac Doppler Indications:    Shock  History:        Patient has prior history of Echocardiogram examinations, most                 recent 08/19/2015. Risk Factors:Hypertension. 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                  Mitral Valve: 28 mm Sorin Mitral Memo 3D prosthetic annuloplasty                 ring valve is present in the mitral position.  Sonographer:    Aron Baba Referring Phys: [id number] SARAH F GROCE IMPRESSIONS  1. Left ventricular ejection fraction, by estimation, is 60 to 65%. The left ventricle has normal function. The left ventricle demonstrates regional wall motion abnormalities (see scoring diagram/findings for description). There is mild left ventricular  hypertrophy. Left ventricular diastolic parameters are consistent with Grade I diastolic dysfunction (impaired relaxation). Elevated left ventricular end-diastolic pressure. There is the interventricular septum is flattened in systole and diastole, consistent with right ventricular pressure and volume overload.  2. Right ventricular systolic function is moderately reduced. The right ventricular size is severely enlarged. There is severely elevated pulmonary artery systolic pressure. The estimated right ventricular systolic pressure is 73.3 mmHg.  3. Left atrial size was mildly dilated.  4. Right atrial size was moderately dilated.  5. The mitral valve has been repaired/replaced. Trivial mitral valve regurgitation. No evidence of mitral stenosis. The mean mitral valve gradient is 4.0 mmHg with average heart rate of 68 bpm. There is a 28 mm Sorin Mitral Memo 3D prosthetic annuloplasty ring present in the mitral position.  6. The tricuspid valve is abnormal.  7. Cannot exclude aortic valve vegetation. The aortic valve is calcified. There is severe  calcifcation of the aortic valve. There is moderate thickening of the aortic valve. Aortic valve regurgitation is not  visualized. Severe aortic valve stenosis. Aortic valve area, by VTI measures 0.53 cm. Aortic valve mean gradient measures 32.7 mmHg. Aortic valve Vmax measures 4.00 m/s. Peak gradient 64 mmHg, DI is 0.23.  8. The inferior vena cava is normal in size with <50% respiratory variability, suggesting right atrial pressure of 8 mmHg. Comparison(s): Changes from prior study are noted. 08/19/2015: LVEF 55-60%, MV s/p repair, no stenosis, mild AS. Conclusion(s)/Recommendation(s): Critical findings reported to Dr. Delton Coombes and acknowledged at 1:15 pm. FINDINGS  Left Ventricle: Left ventricular ejection fraction, by estimation, is 60 to 65%. The left ventricle has normal function. The left ventricle demonstrates regional wall motion abnormalities. The left ventricular internal cavity size was small. There is mild left ventricular hypertrophy. The interventricular septum is flattened in systole and diastole, consistent with right ventricular pressure and volume overload. Left ventricular diastolic parameters are consistent with Grade I diastolic dysfunction (impaired relaxation). Elevated left ventricular end-diastolic pressure. Right Ventricle: The right ventricular size is severely enlarged. No increase in right ventricular wall thickness. Right ventricular systolic function is moderately reduced. There is severely elevated pulmonary artery systolic pressure. The tricuspid regurgitant velocity is 4.04 m/s, and with an assumed right atrial pressure of 8 mmHg, the estimated right ventricular systolic pressure is 73.3 mmHg. Left Atrium: Left atrial size was mildly dilated. Right Atrium: Right atrial size was moderately dilated. Pericardium: There is no evidence of pericardial effusion. Mitral Valve: The mitral valve has been repaired/replaced. Trivial mitral valve regurgitation. There is a 28 mm Sorin Mitral Memo 3D  prosthetic annuloplasty ring present in the mitral position. No evidence of mitral valve stenosis. MV peak gradient, 6.2 mmHg. The mean mitral valve gradient is 4.0 mmHg with average heart rate of 68 bpm. Tricuspid Valve: The tricuspid valve is abnormal. Tricuspid valve regurgitation is mild. Aortic Valve: Cannot exclude aortic valve vegetation. The aortic valve is calcified. There is severe calcifcation of the aortic valve. There is moderate thickening of the aortic valve. Aortic valve regurgitation is not visualized. Severe aortic stenosis is present. Aortic valve mean gradient measures 32.7 mmHg. Aortic valve peak gradient measures 64.1 mmHg. Aortic valve area, by VTI measures 0.53 cm. Pulmonic Valve: The pulmonic valve was normal in structure. Pulmonic valve regurgitation is not visualized. Aorta: The aortic root and ascending aorta are structurally normal, with no evidence of dilitation. Venous: The inferior vena cava is normal in size with less than 50% respiratory variability, suggesting right atrial pressure of 8 mmHg. IAS/Shunts: No atrial level shunt detected by color flow Doppler.  LEFT VENTRICLE PLAX 2D LVIDd:         5.80 cm   Diastology LVIDs:         5.10 cm   LV e' medial:    4.24 cm/s LV PW:         1.40 cm   LV E/e' medial:  29.0 LV IVS:        0.80 cm   LV e' lateral:   4.57 cm/s LVOT diam:     1.70 cm   LV E/e' lateral: 26.9 LV SV:         31 LV SV Index:   14 LVOT Area:     2.27 cm  RIGHT VENTRICLE RV S prime:     8.70 cm/s TAPSE (M-mode): 2.1 cm LEFT ATRIUM             Index        RIGHT ATRIUM  Index LA diam:        3.80 cm 1.67 cm/m   RA Area:     25.60 cm LA Vol (A2C):   33.6 ml 14.75 ml/m  RA Volume:   89.80 ml  39.41 ml/m LA Vol (A4C):   55.6 ml 24.40 ml/m LA Biplane Vol: 45.9 ml 20.15 ml/m  AORTIC VALVE AV Area (Vmax):    0.54 cm AV Area (Vmean):   0.48 cm AV Area (VTI):     0.53 cm AV Vmax:           400.33 cm/s AV Vmean:          253.333 cm/s AV VTI:            0.592  m AV Peak Grad:      64.1 mmHg AV Mean Grad:      32.7 mmHg LVOT Vmax:         94.70 cm/s LVOT Vmean:        53.400 cm/s LVOT VTI:          0.138 m LVOT/AV VTI ratio: 0.23  AORTA Ao Root diam: 3.00 cm MITRAL VALVE                TRICUSPID VALVE MV Area (PHT): 3.12 cm     TR Peak grad:   65.3 mmHg MV Area VTI:   0.60 cm     TR Vmax:        404.00 cm/s MV Peak grad:  6.2 mmHg MV Mean grad:  4.0 mmHg     SHUNTS MV Vmax:       1.25 m/s     Systemic VTI:  0.14 m MV Vmean:      94.5 cm/s    Systemic Diam: 1.70 cm MV Decel Time: 243 msec MR Peak grad: 2.3 mmHg MR Vmax:      76.00 cm/s MV E velocity: 123.00 cm/s MV A velocity: 116.00 cm/s MV E/A ratio:  1.06 Zoila Shutter MD Electronically signed by Zoila Shutter MD Signature Date/Time: 03/29/2023/1:16:01 PM    Final    CT ABDOMEN PELVIS WO CONTRAST  Result Date: 03/29/2023 CLINICAL DATA:  Acute abdominal pain.  Diarrhea.  Septic shock. EXAM: CT ABDOMEN AND PELVIS WITHOUT CONTRAST TECHNIQUE: Multidetector CT imaging of the abdomen and pelvis was performed following the standard protocol without IV contrast. RADIATION DOSE REDUCTION: This exam was performed according to the departmental dose-optimization program which includes automated exposure control, adjustment of the mA and/or kV according to patient size and/or use of iterative reconstruction technique. COMPARISON:  04/06/2015 FINDINGS: Lower chest: No acute findings. Hepatobiliary: No mass visualized on this unenhanced exam. Gallstones are seen, however there is no evidence of cholecystitis or biliary dilatation. Pancreas: No mass or inflammatory process visualized on this unenhanced exam. Spleen:  Within normal limits in size. Adrenals/Urinary tract: Enlarged kidneys are again seen bilaterally with diffuse involvement by innumerable cysts, many showing mural calcification. Renal masses cannot definitely be excluded on this unenhanced exam. No evidence of ureteral calculi or hydronephrosis. Urinary bladder is  nearly completely empty. Stomach/Bowel: Generalized gaseous distention of stomach and nondependent bowel loops is noted, without transition point. This is most consistent with ileus. No evidence of focal inflammatory process or abscess. Small amount of free fluid noted in right abdomen and pelvis, with diffuse mesenteric and body wall edema. Vascular/Lymphatic: No pathologically enlarged lymph nodes identified. No evidence of abdominal aortic aneurysm. Reproductive:  No mass or other significant abnormality. Other:  None. Musculoskeletal:  No suspicious bone lesions identified. IMPRESSION: Adynamic ileus pattern. No evidence of bowel obstruction or focal inflammatory process. Polycystic kidney disease. Small amount of ascites, with mild diffuse mesenteric and body wall edema. Cholelithiasis. No radiographic evidence of cholecystitis. Electronically Signed   By: Danae Orleans M.D.   On: 03/29/2023 09:35   DG Chest Portable 1 View  Result Date: 03/29/2023 CLINICAL DATA:  57 year old male with history of cough. EXAM: PORTABLE CHEST 1 VIEW COMPARISON:  Chest x-ray 03/19/2015. FINDINGS: Lung volumes are low. Elevation of the right hemidiaphragm, worsened compared to the prior study from 2016. No definite consolidative airspace disease. Mild diffuse interstitial prominence and peribronchial cuffing, increased compared to the remote prior examination. No pleural effusions. No pneumothorax. No evidence of pulmonary edema. Heart size is mildly enlarged. Upper mediastinal contours are within normal limits. Atherosclerotic calcifications are noted in the thoracic aorta. IMPRESSION: 1. Mild diffuse peribronchial cuffing and interstitial prominence, which may suggest an acute bronchitis, although similar findings were evident in 2016 so some element of chronic bronchitis is suspected as well. 2. Severe elevation of the right hemidiaphragm worsened compared to the prior study suggesting right hemidiaphragm paralysis. 3. Aortic  atherosclerosis. Electronically Signed   By: Trudie Reed M.D.   On: 03/29/2023 08:18    Cardiac Studies   See echo above  Assessment   Principal Problem:   Shock (HCC) Active Problems:   QT prolongation   S/P minimally invasive mitral valve repair   Paroxysmal atrial fibrillation (HCC)   ESRD on dialysis (HCC)   Hypotension   Lactic acidosis   Aortic stenosis   Right heart failure (HCC)   Plan   Significant events overnight, including VT requiring cardioversion. Noted to have a long QTc today at 612 msec- would avoid QT prolonging agents -this may have been a factor in the VT or it was also noted he was significantly hyperkalemic at 6.9. Now on CRRT.  Would not give additional amiodarone due to QT prolonging effects. Troponin up and down, but no acute ST elevation, therefore would not recommend cath at this time - continue heparin gtts. He has severe AS - will need further evaluation when more hemodynamically stable. This can make management quite challenging. Agree with antibiotics to cover possible SBE since he has a history of this and it could not be excluded on echo.   CRITICAL CARE TIME: I have spent a total of 35 minutes with patient reviewing hospital notes, telemetry, EKGs, labs and examining the patient as well as establishing an assessment and plan that was discussed with the patient.  > 50% of time was spent in direct patient care. The patient is critically ill with multi-organ system failure and requires high complexity decision making for assessment and support, frequent evaluation and titration of therapies, application of advanced monitoring technologies and extensive interpretation of multiple databases.   Length of Stay:  LOS: 1 day   Chrystie Nose, MD, [facility], FACP  [place]  [facility]  Medical Director of the [facility] Diplomate of the American Board of Clinical Lipidology Attending  Cardiologist  Direct Dial: [phone]  Fax: [phone]  Website:  [email or web address] 03/30/2023, 9:59 AM

## 2023-03-30 NOTE — Progress Notes (Signed)
ANTICOAGULATION CONSULT NOTE  Pharmacy Consult for Heparin Indication: ACS vs VTE  No Known Allergies  Patient Measurements: Height: 5\' 11"  (180.3 cm) Weight: 111.7 kg (246 lb 4.1 oz) IBW/kg (Calculated) : 75.3 Heparin Dosing Weight: 100 kg  Vital Signs: Temp: 98.5 F (36.9 C) (09/22 1140) Temp Source: Oral (09/22 1140) BP: 126/45 (09/22 1129) Pulse Rate: 58 (09/22 1245)  Labs: Recent Labs    03/29/23 1158 03/29/23 1350 03/29/23 1912 03/29/23 2120 03/29/23 2214 03/29/23 2352 03/30/23 0134 03/30/23 0205 03/30/23 0312 03/30/23 0359 03/30/23 0555 03/30/23 1200 03/30/23 1252  HGB 11.6*  --  11.8*   < >  --   --  11.6*  --   --  12.6*  --   --  11.9*  HCT 34.9*  --  37.6*   < >  --   --  32.7*  --   --  37.0*  --   --  35.0*  PLT 224  --  227  --   --   --  233  --   --   --   --   --   --   APTT  --   --   --   --  69*  --   --  67*  --   --   --  65*  --   LABPROT 20.8*  --   --   --   --   --   --   --   --   --   --   --   --   INR 1.8*  --   --   --   --   --   --   --   --   --   --   --   --   HEPARINUNFRC  --   --   --   --  0.25*  --   --   --  0.22*  --   --  0.16*  --   CREATININE 10.11*  --  10.23*  --   --   --   --   --  7.69*  --   --   --   --   TROPONINIHS  --    < > 1,425*  --  1,143* 1,293* 1,254*  --   --   --  1,601*  --   --    < > = values in this interval not displayed.    Estimated Creatinine Clearance: 13.8 mL/min (A) (by C-G formula based on SCr of 7.69 mg/dL (H)).   Assessment: 68 YOM presenting with shock. Significant cardiac history including MSSA endocarditis, mitral regurgitation, aortic stenosis. Also hx of ESRD and DVT. Presented with elevated troponins and ECHO showed moderate RV dysfunction and RV enlargement. Pharmacy consulted to dose heparin to rule out PE with potential ACS. No AC PTA  TBilirubin 8.6, HL 0.16. APTT slightly subtherapeutic at 65. No new bleeding noted, drawn appropriately.  Goal of Therapy:  Heparin level  0.3-0.7 units/ml aPTT 66-102 seconds Monitor platelets by anticoagulation protocol: Yes   Plan:  Continue to use aPTT to guide dosing w/ elevated Tbili Increase heparin slightly to 1700 units/hr to maintain therapeutic level Check heparin level/aPTT in AM Daily CBC and heparin level/aPTT  Rutherford Nail, PharmD PGY2 Critical Care Pharmacy Resident 03/30/2023 12:58 PM

## 2023-03-31 ENCOUNTER — Inpatient Hospital Stay (HOSPITAL_COMMUNITY): Payer: Medicare Other

## 2023-03-31 DIAGNOSIS — I340 Nonrheumatic mitral (valve) insufficiency: Secondary | ICD-10-CM

## 2023-03-31 DIAGNOSIS — I350 Nonrheumatic aortic (valve) stenosis: Secondary | ICD-10-CM | POA: Diagnosis not present

## 2023-03-31 DIAGNOSIS — J9601 Acute respiratory failure with hypoxia: Secondary | ICD-10-CM | POA: Diagnosis not present

## 2023-03-31 DIAGNOSIS — R579 Shock, unspecified: Secondary | ICD-10-CM | POA: Diagnosis not present

## 2023-03-31 DIAGNOSIS — R9431 Abnormal electrocardiogram [ECG] [EKG]: Secondary | ICD-10-CM | POA: Diagnosis not present

## 2023-03-31 LAB — GLUCOSE, CAPILLARY
Glucose-Capillary: 149 mg/dL — ABNORMAL HIGH (ref 70–99)
Glucose-Capillary: 143 mg/dL — ABNORMAL HIGH (ref 70–99)
Glucose-Capillary: 145 mg/dL — ABNORMAL HIGH (ref 70–99)
Glucose-Capillary: 147 mg/dL — ABNORMAL HIGH (ref 70–99)
Glucose-Capillary: 178 mg/dL — ABNORMAL HIGH (ref 70–99)
Glucose-Capillary: 187 mg/dL — ABNORMAL HIGH (ref 70–99)

## 2023-03-31 LAB — RENAL FUNCTION PANEL
Albumin: 1.8 g/dL — ABNORMAL LOW (ref 3.5–5.0)
Albumin: 1.8 g/dL — ABNORMAL LOW (ref 3.5–5.0)
Anion gap: 18 — ABNORMAL HIGH (ref 5–15)
Anion gap: 17 — ABNORMAL HIGH (ref 5–15)
BUN: 39 mg/dL — ABNORMAL HIGH (ref 6–20)
BUN: 35 mg/dL — ABNORMAL HIGH (ref 6–20)
CO2: 19 mmol/L — ABNORMAL LOW (ref 22–32)
CO2: 19 mmol/L — ABNORMAL LOW (ref 22–32)
Calcium: 6.8 mg/dL — ABNORMAL LOW (ref 8.9–10.3)
Calcium: 7.1 mg/dL — ABNORMAL LOW (ref 8.9–10.3)
Chloride: 97 mmol/L — ABNORMAL LOW (ref 98–111)
Chloride: 98 mmol/L (ref 98–111)
Creatinine, Ser: 4.31 mg/dL — ABNORMAL HIGH (ref 0.61–1.24)
Creatinine, Ser: 3.34 mg/dL — ABNORMAL HIGH (ref 0.61–1.24)
GFR, Estimated: 15 mL/min — ABNORMAL LOW (ref >60)
GFR, Estimated: 21 mL/min — ABNORMAL LOW
Glucose, Bld: 141 mg/dL — ABNORMAL HIGH (ref 70–99)
Glucose, Bld: 155 mg/dL — ABNORMAL HIGH (ref 70–99)
Phosphorus: 5.1 mg/dL — ABNORMAL HIGH (ref 2.5–4.6)
Phosphorus: 5 mg/dL — ABNORMAL HIGH (ref 2.5–4.6)
Potassium: 4 mmol/L (ref 3.5–5.1)
Potassium: 3.8 mmol/L (ref 3.5–5.1)
Sodium: 134 mmol/L — ABNORMAL LOW (ref 135–145)
Sodium: 134 mmol/L — ABNORMAL LOW (ref 135–145)

## 2023-03-31 LAB — ECHO TEE
AR max vel: 0.99 cm2
AV Area VTI: 0.96 cm2
AV Area mean vel: 0.95 cm2
AV Mean grad: 40 mmHg
AV Peak grad: 68.2 mmHg
Ao pk vel: 4.13 m/s
MV VTI: 1.3 cm2

## 2023-03-31 LAB — CBC
HCT: 30.4 % — ABNORMAL LOW (ref 39.0–52.0)
Hemoglobin: 10.8 g/dL — ABNORMAL LOW (ref 13.0–17.0)
MCH: 29.3 pg (ref 26.0–34.0)
MCHC: 35.5 g/dL (ref 30.0–36.0)
MCV: 82.6 fL (ref 80.0–100.0)
Platelets: 167 10*3/uL (ref 150–400)
RBC: 3.68 MIL/uL — ABNORMAL LOW (ref 4.22–5.81)
RDW: 15.9 % — ABNORMAL HIGH (ref 11.5–15.5)
WBC: 19.6 10*3/uL — ABNORMAL HIGH (ref 4.0–10.5)
nRBC: 1.2 % — ABNORMAL HIGH (ref 0.0–0.2)

## 2023-03-31 LAB — LACTIC ACID, PLASMA: Lactic Acid, Venous: 2 mmol/L (ref 0.5–1.9)

## 2023-03-31 LAB — MAGNESIUM
Magnesium: 2.4 mg/dL (ref 1.7–2.4)
Magnesium: 2.4 mg/dL (ref 1.7–2.4)

## 2023-03-31 LAB — HEPARIN LEVEL (UNFRACTIONATED): Heparin Unfractionated: 0.21 [IU]/mL — ABNORMAL LOW (ref 0.30–0.70)

## 2023-03-31 LAB — PHOSPHORUS: Phosphorus: 4.9 mg/dL — ABNORMAL HIGH (ref 2.5–4.6)

## 2023-03-31 LAB — APTT: aPTT: 75 seconds — ABNORMAL HIGH (ref 24–36)

## 2023-03-31 MED ORDER — PROSOURCE TF20 ENFIT COMPATIBL EN LIQD
60.0000 mL | Freq: Two times a day (BID) | ENTERAL | Status: DC
  Administered 2023-03-31 – 2023-04-03 (×6): 60 mL
  Filled 2023-03-31 (×6): qty 60

## 2023-03-31 MED ORDER — MIDAZOLAM HCL 2 MG/2ML IJ SOLN
INTRAMUSCULAR | Status: AC
  Administered 2023-03-31: 4 mg via INTRAVENOUS
  Filled 2023-03-31: qty 4

## 2023-03-31 MED ORDER — CALCIUM GLUCONATE-NACL 1-0.675 GM/50ML-% IV SOLN
1.0000 g | Freq: Once | INTRAVENOUS | Status: AC
  Administered 2023-03-31: 1000 mg via INTRAVENOUS
  Filled 2023-03-31: qty 50

## 2023-03-31 MED ORDER — PRISMASOL BGK 0/2.5 32-2.5 MEQ/L EC SOLN
Status: DC
  Filled 2023-03-31 (×4): qty 5000

## 2023-03-31 MED ORDER — MIDAZOLAM HCL 2 MG/2ML IJ SOLN: 4.0000 mg | Freq: Once | INTRAMUSCULAR | Status: AC

## 2023-03-31 MED ORDER — INSULIN ASPART 100 UNIT/ML IJ SOLN
0.0000 [IU] | INTRAMUSCULAR | Status: DC
  Administered 2023-03-31: 3 [IU] via SUBCUTANEOUS
  Administered 2023-04-01: 2 [IU] via SUBCUTANEOUS
  Administered 2023-04-01: 5 [IU] via SUBCUTANEOUS
  Administered 2023-04-01: 2 [IU] via SUBCUTANEOUS
  Administered 2023-04-01: 3 [IU] via SUBCUTANEOUS
  Administered 2023-04-01: 5 [IU] via SUBCUTANEOUS
  Administered 2023-04-01 – 2023-04-03 (×8): 3 [IU] via SUBCUTANEOUS
  Administered 2023-04-03: 5 [IU] via SUBCUTANEOUS
  Administered 2023-04-03: 3 [IU] via SUBCUTANEOUS
  Administered 2023-04-03: 5 [IU] via SUBCUTANEOUS
  Administered 2023-04-03 – 2023-04-04 (×4): 2 [IU] via SUBCUTANEOUS
  Administered 2023-04-04 (×3): 3 [IU] via SUBCUTANEOUS
  Administered 2023-04-05 – 2023-04-08 (×11): 2 [IU] via SUBCUTANEOUS
  Administered 2023-04-08 – 2023-04-09 (×2): 3 [IU] via SUBCUTANEOUS
  Administered 2023-04-09: 2 [IU] via SUBCUTANEOUS

## 2023-03-31 MED ORDER — PIVOT 1.5 CAL PO LIQD
1000.0000 mL | ORAL | Status: DC
  Administered 2023-03-31 – 2023-04-03 (×4): 1000 mL
  Filled 2023-03-31 (×5): qty 1000

## 2023-03-31 MED ORDER — HYDROCORTISONE SOD SUC (PF) 100 MG IJ SOLR
100.0000 mg | Freq: Two times a day (BID) | INTRAMUSCULAR | Status: DC
  Administered 2023-03-31: 100 mg via INTRAVENOUS
  Filled 2023-03-31: qty 2

## 2023-03-31 MED ORDER — PRISMASOL BGK 0/2.5 32-2.5 MEQ/L EC SOLN
Status: DC
  Filled 2023-03-31 (×18): qty 5000

## 2023-03-31 NOTE — Procedures (Signed)
TRANSESOPHAGEAL ECHOCARDIOGRAM   NAME:  Timothy Palmer   MRN: [id number] DOB:  April 09, 1966   ADMIT DATE: 03/29/2023  INDICATIONS: Suspected endocarditis  PROCEDURE:   Informed consent was obtained prior to the procedure. The risks, benefits and alternatives for the procedure were discussed and the patient comprehended these risks.  Risks include, but are not limited to, cough, sore throat, vomiting, nausea, somnolence, esophageal and stomach trauma or perforation, bleeding, low blood pressure, aspiration, pneumonia, infection, trauma to the teeth and death.    Procedural time out performed. The oropharynx was anesthetized with topical 1% benzocaine.    During this procedure the patient is administered a total of Versed 4mg  mg and Fentanyl 100 mcg to achieve and maintain moderate conscious sedation.  The patient's heart rate, blood pressure, and oxygen saturation are monitored continuously during the procedure. Sedation monitored by PCCM.  The transesophageal probe was inserted in the esophagus and stomach without difficulty and multiple views were obtained.    COMPLICATIONS:    There were no immediate complications.  FINDINGS: No vegetation seen.  Severe AS.  Severe RV systolic dysfunction.  Severely elevated pulmonary pressures.   Epifanio Lesches MD [facility]  [address], [address] [place], [place] [address] [phone]   10:18 AM

## 2023-03-31 NOTE — Progress Notes (Signed)
eLink Physician-Brief Progress Note Patient Name: Timothy Palmer DOB: 1966/07/06 MRN: [id number]   Date of Service  03/31/2023  HPI/Events of Note  Cardiogenic versus sepsis shock and patient with heart failure, end-stage renal disease.  Started on tube feeds, CBGs in the 170s.  No coverage presently.  No history of diabetes.  eICU Interventions  Add sliding scale insulin.     Intervention Category Intermediate Interventions: Hyperglycemia - evaluation and treatment  Lendell Gallick 03/31/2023, 9:10 PM

## 2023-03-31 NOTE — Progress Notes (Signed)
Rounding Note    Patient Name: Timothy Palmer Date of Encounter: 03/31/2023  [facility] Cardiologist: Little Ishikawa, MD   Subjective   Intubated, sedated, not following commands.  On levophed at 2 mcg/min and vasopressin.  FiO2 40%, PEEP 5.  Inpatient Medications    Scheduled Meds:  [START ON 04/01/2023] calcitRIOL  0.75 mcg Per Tube Q T,Th,Sat-1800   Chlorhexidine Gluconate Cloth  6 each Topical Daily   docusate  100 mg Per Tube BID   hydrocortisone sod succinate (SOLU-CORTEF) inj  100 mg Intravenous Q8H   mouth rinse  15 mL Mouth Rinse Q2H   pantoprazole (PROTONIX) IV  40 mg Intravenous Daily   polyethylene glycol  17 g Per Tube Daily   Continuous Infusions:  calcium gluconate     ceFEPime (MAXIPIME) IV Stopped (03/30/23 1939)   fentaNYL infusion INTRAVENOUS 125 mcg/hr (03/31/23 0800)   heparin 1,700 Units/hr (03/31/23 0800)   norepinephrine (LEVOPHED) Adult infusion 3 mcg/min (03/31/23 0800)   prismasol BGK 0/2.5 400 mL/hr at 03/30/23 2158   prismasol BGK 0/2.5 400 mL/hr at 03/30/23 2158   prismasol BGK 0/2.5 1,500 mL/hr at 03/31/23 0502   vancomycin Stopped (03/31/23 0002)   vasopressin 0.04 Units/min (03/31/23 0800)   PRN Meds: docusate, fentaNYL, Gerhardt's butt cream, heparin, lip balm, midazolam, mouth rinse, polyethylene glycol   Vital Signs    Vitals:   03/31/23 0715 03/31/23 0730 03/31/23 0745 03/31/23 0752  BP:      Pulse: (!) 48 (!) 59 (!) 53   Resp: 20 20 20    Temp:    (!) 97.2 F (36.2 C)  TempSrc:    Axillary  SpO2: 100% 100% 98%   Weight:      Height:        Intake/Output Summary (Last 24 hours) at 03/31/2023 0805 Last data filed at 03/31/2023 0800 Gross per 24 hour  Intake 1834.1 ml  Output 1793.1 ml  Net 41 ml      03/31/2023    2:22 AM 03/30/2023    3:27 AM 03/29/2023   12:54 PM  Last 3 Weights  Weight (lbs) 239 lb 3.2 oz 246 lb 4.1 oz 251 lb 8.7 oz  Weight (kg) 108.5 kg 111.7 kg 114.1 kg      Telemetry     NSR, brief NSVT, PACs/PVCs - Personally Reviewed  ECG    Sinus bradycardia, rate 53, incomplete right bundle branch block, right axis deviation, QTc 596- Personally Reviewed  Physical Exam   GEN: intubated, sedated  Neck: No JVD appreciated Cardiac: RRR, 3/6 systolic murmur Respiratory: Clear to auscultation bilaterally in anterior fields GI: Soft, nontender, non-distended  MS: No edema Neuro:  No following commands Psych: Unable to assess  Labs    High Sensitivity Troponin:   Recent Labs  Lab 03/29/23 1912 03/29/23 2214 03/29/23 2352 03/30/23 0134 03/30/23 0555  TROPONINIHS 1,425* 1,143* 1,293* 1,254* 1,601*     Chemistry Recent Labs  Lab 03/29/23 1013 03/29/23 1118 03/29/23 1912 03/29/23 2120 03/30/23 0134 03/30/23 0312 03/30/23 0359 03/30/23 1250 03/30/23 1252 03/30/23 1600 03/31/23 0224  NA 135   < > 136   < >  --  134*   < > 134* 135 135 134*  K 5.9*   < > 6.9*   < >  --  6.0*   < > 5.1 5.1 5.0 4.0  CL 89*   < > 89*  --   --  94*  --  94*  --  99 97*  CO2 17*  --  14*  --   --  19*  --  20*  --  21* 19*  GLUCOSE 64*   < > 67*  --   --  133*  --  126*  --  126* 141*  BUN 70*   < > 79*  --   --  63*  --  51*  --  44* 39*  CREATININE 10.07*   < > 10.23*  --   --  7.69*  --  5.71*  --  5.41* 4.31*  CALCIUM 7.0*  --  7.9*  --   --  6.6*  --  6.7*  --  6.5* 6.8*  MG 2.6*  --  2.9*  --  2.5*  --   --   --   --   --  2.4  PROT 7.6  --   --   --   --   --   --   --   --   --   --   ALBUMIN 2.2*  --   --   --   --  2.1*  --   --   --  1.9* 1.8*  AST 445*  --   --   --   --   --   --   --   --   --   --   ALT 96*  --   --   --   --   --   --   --   --   --   --   ALKPHOS 165*  --   --   --   --   --   --   --   --   --   --   BILITOT 8.6*  --   --   --   --   --   --   --   --   --   --   GFRNONAA 6*   < > 5*  --   --  8*  --  11*  --  12* 15*  ANIONGAP 29*  --  33*  --   --  21*  --  20*  --  15 18*   < > = values in this interval not displayed.    Lipids No  results for input(s): "CHOL", "TRIG", "HDL", "LABVLDL", "LDLCALC", "CHOLHDL" in the last 168 hours.  Hematology Recent Labs  Lab 03/29/23 1912 03/29/23 2120 03/30/23 0134 03/30/23 0359 03/30/23 1252 03/31/23 0224  WBC 23.6*  --  22.2*  --   --  19.6*  RBC 4.14*  --  4.00*  --   --  3.68*  HGB 11.8*   < > 11.6* 12.6* 11.9* 10.8*  HCT 37.6*   < > 32.7* 37.0* 35.0* 30.4*  MCV 90.8  --  81.8  --   --  82.6  MCH 28.5  --  29.0  --   --  29.3  MCHC 31.4  --  35.5  --   --  35.5  RDW 17.5*  --  16.6*  --   --  15.9*  PLT 227  --  233  --   --  167   < > = values in this interval not displayed.   Thyroid No results for input(s): "TSH", "FREET4" in the last 168 hours.  BNP Recent Labs  Lab 03/29/23 1158  BNP 2,670.6*    DDimer No results for input(s): "DDIMER"  in the last 168 hours.   Radiology    DG CHEST PORT 1 VIEW  Result Date: 03/31/2023 CLINICAL DATA:  Ventilator support. EXAM: PORTABLE CHEST 1 VIEW COMPARISON:  03/30/2023 FINDINGS: Endotracheal tube tip 5 cm above the carina. Orogastric or nasogastric tube enters the stomach. Right internal jugular central line tip in the SVC at the as kiss level. Previous mitral valve replacement. Relatively low lung volumes. No pulmonary edema. Chronic calcified pleural plaques on the right. Right pleural scarring. Minimal volume loss in both lower lobes. IMPRESSION: 1. Lines and tubes in good position. 2. Low lung volumes. Minimal volume loss in both lower lobes. 3. Chronic calcified pleural plaques on the right. Electronically Signed   By: Paulina Fusi M.D.   On: 03/31/2023 07:26   DG Chest Port 1 View  Result Date: 03/30/2023 CLINICAL DATA:  57 year old male with history of dyspnea. EXAM: PORTABLE CHEST 1 VIEW COMPARISON:  Chest x-ray 03/29/2023. FINDINGS: An endotracheal tube is in place with tip 5.3 cm above the carina. There is a right-sided internal jugular central venous catheter with tip terminating in the proximal superior vena cava. A  nasogastric tube is seen extending into the stomach, however, the tip of the nasogastric tube extends below the lower margin of the image. Transcutaneous defibrillator pads project over the right hemithorax and left upper quadrant of the abdomen. Lung volumes are low. Patchy areas of interstitial prominence an ill-defined opacities are noted in both lungs, most evident in the right upper lobe and left base. No pleural effusions. No pneumothorax. No evidence of pulmonary edema. Heart size is normal. The patient is rotated to the left on today's exam, resulting in distortion of the mediastinal contours and reduced diagnostic sensitivity and specificity for mediastinal pathology. Mitral annuloplasty ring or bioprosthetic valve noted. Atherosclerotic calcifications are noted in the thoracic aorta. IMPRESSION: 1. Support apparatus, as above. 2. Patchy areas of interstitial prominence an ill-defined opacities concerning for developing multilobar bilateral bronchopneumonia. 3. Aortic atherosclerosis. Electronically Signed   By: Trudie Reed M.D.   On: 03/30/2023 05:49   DG Abd Portable 1V  Result Date: 03/29/2023 CLINICAL DATA:  Orogastric tube placement EXAM: PORTABLE ABDOMEN - 1 VIEW COMPARISON:  03/29/2023 FINDINGS: Orogastric tube tip within the expected distal body of the stomach. Normal abdominal gas pattern. No free intraperitoneal gas. Patchy pulmonary infiltrates noted within the visualized lung bases bilaterally. IMPRESSION: 1. Orogastric tube tip within the expected distal body of the stomach. Electronically Signed   By: Helyn Numbers M.D.   On: 03/29/2023 22:14   DG Abd 1 View  Result Date: 03/29/2023 CLINICAL DATA:  [id number]. Encounter for imaging study to confirm orogastric tube placement. EXAM: ABDOMEN - 1 VIEW COMPARISON:  Earlier study today at 6:44 p.m. FINDINGS: 7:45 p.m. The pelvis is not included in the study. Interval new NGT insertion. The tip is in the upper stomach with the side hole at  the GE junction and needs to be advanced further in 10-12 cm. Small bowel dilatation continues in the upper abdomen, maximum caliber 4.3 cm, again concerning for small bowel obstruction or worsening ileus since the CT from this morning. Small bowel dilatation was greater in the lower abdomen on the last film. This area is not evaluated. Electrical pads overlie the lower chest and there is overlying wiring. No supine evidence of free air. IMPRESSION: 1. NGT tip is in the upper stomach with the side hole at the GE junction and needs to be advanced further in 10-12 cm.  2. Persistent small bowel dilatation, maximum upper abdominal caliber 4.3 cm, concerning for small bowel obstruction or worsening ileus. Electronically Signed   By: Almira Bar M.D.   On: 03/29/2023 20:30   DG Abd 1 View  Result Date: 03/29/2023 CLINICAL DATA:  [id number] with abdominal distention. EXAM: ABDOMEN - 1 VIEW COMPARISON:  CT without contrast today at 9:23 a.m. FINDINGS: Generalized worsening small-bowel dilatation, maximum caliber was previously 2.7 cm now 5.5 cm worrisome for obstruction or worsening severe ileus. Scattered colonic aeration remains present at least to the distal descending colon. A rim calcified right renal cyst again superimposes over the lateral right iliac crest. There are vascular and vasa deferentia calcifications. No supine evidence of free air. No acute osseous findings. IMPRESSION: Generalized worsening small-bowel dilatation, maximum caliber was previously 2.7 cm now 5.5 cm. Findings are worrisome for small bowel obstruction or worsening severe ileus. Electronically Signed   By: Almira Bar M.D.   On: 03/29/2023 20:24   DG Chest Port 1 View  Result Date: 03/29/2023 CLINICAL DATA:  [id number].  History of ETT. EXAM: PORTABLE CHEST 1 VIEW COMPARISON:  Earlier study today at 6:40 p.m. FINDINGS: 7:23 p.m. there is overlying wiring and newly noted electrical pads. Interval intubation with ETT tip 2.8 cm from carina.  Right IJ catheter interval pullback to the junction of the right IJ/subclavian vein, previously in the upper SVC. Stable cardiomegaly with AVR. Central vessels are normal caliber. Low lung volumes. Increased scattered heterogeneous opacities left mid to lower lung fields which could be atelectasis or pneumonia. Small right pleural effusion again noted with elevated hemidiaphragm, right mid perihilar linear atelectasis. Stable mediastinum with widening superiorly, aortic atherosclerosis. No new osseous finding.  In all other respects no further changes. IMPRESSION: 1. Interval intubation with ETT tip 2.8 cm from carina. 2. Right IJ catheter interval pullback to the junction of the right IJ/subclavian vein. 3. Increased left mid to lower lung heterogeneous opacities which could be atelectasis or pneumonia. 4. Unchanged small right pleural effusion.  Cardiomegaly. Electronically Signed   By: Almira Bar M.D.   On: 03/29/2023 20:18   DG CHEST PORT 1 VIEW  Result Date: 03/29/2023 CLINICAL DATA:  [id number] Encounter for central line placement [id number] EXAM: PORTABLE CHEST 1 VIEW COMPARISON:  03/29/2023 FINDINGS: Interval placement of right internal jugular approach central venous catheter with distal tip terminating at the level of the mid SVC. Stable cardiomegaly. Aortic atherosclerosis. Low lung volumes with elevation of the right hemidiaphragm. Similar bilateral interstitial prominence. No pneumothorax. IMPRESSION: Interval placement of right internal jugular approach central venous catheter with distal tip terminating at the level of the mid SVC. No pneumothorax. Electronically Signed   By: Duanne Guess D.O.   On: 03/29/2023 19:33   ECHOCARDIOGRAM COMPLETE  Result Date: 03/29/2023    ECHOCARDIOGRAM REPORT   Patient Name:   SALAAR HELLYER [facility] Date of Exam: 03/29/2023 Medical Rec #:  [id number]      Height:       71.0 in Accession #:    [id number]     Weight:       240.0 lb Date of Birth:  12/12/1965     BSA:           2.278 m Patient Age:    56 years       BP:           90/63 mmHg Patient Gender: M              HR:  63 bpm. Exam Location:  Inpatient Procedure: 2D Echo, Color Doppler and Cardiac Doppler Indications:    Shock  History:        Patient has prior history of Echocardiogram examinations, most                 recent 08/19/2015. Risk Factors:Hypertension. 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                  Mitral Valve: 28 mm Sorin Mitral Memo 3D prosthetic annuloplasty                 ring valve is present in the mitral position.  Sonographer:    Aron Baba Referring Phys: [id number] SARAH F GROCE IMPRESSIONS  1. Left ventricular ejection fraction, by estimation, is 60 to 65%. The left ventricle has normal function. The left ventricle demonstrates regional wall motion abnormalities (see scoring diagram/findings for description). There is mild left ventricular  hypertrophy. Left ventricular diastolic parameters are consistent with Grade I diastolic dysfunction (impaired relaxation). Elevated left ventricular end-diastolic pressure. There is the interventricular septum is flattened in systole and diastole, consistent with right ventricular pressure and volume overload.  2. Right ventricular systolic function is moderately reduced. The right ventricular size is severely enlarged. There is severely elevated pulmonary artery systolic pressure. The estimated right ventricular systolic pressure is 73.3 mmHg.  3. Left atrial size was mildly dilated.  4. Right atrial size was moderately dilated.  5. The mitral valve has been repaired/replaced. Trivial mitral valve regurgitation. No evidence of mitral stenosis. The mean mitral valve gradient is 4.0 mmHg with average heart rate of 68 bpm. There is a 28 mm Sorin Mitral Memo 3D prosthetic annuloplasty ring present in the mitral position.  6. The tricuspid valve is abnormal.  7. Cannot exclude aortic valve vegetation.  The aortic valve is calcified. There is severe calcifcation of the aortic valve. There is moderate thickening of the aortic valve. Aortic valve regurgitation is not visualized. Severe aortic valve stenosis. Aortic valve area, by VTI measures 0.53 cm. Aortic valve mean gradient measures 32.7 mmHg. Aortic valve Vmax measures 4.00 m/s. Peak gradient 64 mmHg, DI is 0.23.  8. The inferior vena cava is normal in size with <50% respiratory variability, suggesting right atrial pressure of 8 mmHg. Comparison(s): Changes from prior study are noted. 08/19/2015: LVEF 55-60%, MV s/p repair, no stenosis, mild AS. Conclusion(s)/Recommendation(s): Critical findings reported to Dr. Delton Coombes and acknowledged at 1:15 pm. FINDINGS  Left Ventricle: Left ventricular ejection fraction, by estimation, is 60 to 65%. The left ventricle has normal function. The left ventricle demonstrates regional wall motion abnormalities. The left ventricular internal cavity size was small. There is mild left ventricular hypertrophy. The interventricular septum is flattened in systole and diastole, consistent with right ventricular pressure and volume overload. Left ventricular diastolic parameters are consistent with Grade I diastolic dysfunction (impaired relaxation). Elevated left ventricular end-diastolic pressure. Right Ventricle: The right ventricular size is severely enlarged. No increase in right ventricular wall thickness. Right ventricular systolic function is moderately reduced. There is severely elevated pulmonary artery systolic pressure. The tricuspid regurgitant velocity is 4.04 m/s, and with an assumed right atrial pressure of 8 mmHg, the estimated right ventricular systolic pressure is 73.3 mmHg. Left Atrium: Left atrial size was mildly dilated. Right Atrium: Right atrial size was moderately dilated. Pericardium: There is no  evidence of pericardial effusion. Mitral Valve: The mitral valve has been repaired/replaced. Trivial mitral valve  regurgitation. There is a 28 mm Sorin Mitral Memo 3D prosthetic annuloplasty ring present in the mitral position. No evidence of mitral valve stenosis. MV peak gradient, 6.2 mmHg. The mean mitral valve gradient is 4.0 mmHg with average heart rate of 68 bpm. Tricuspid Valve: The tricuspid valve is abnormal. Tricuspid valve regurgitation is mild. Aortic Valve: Cannot exclude aortic valve vegetation. The aortic valve is calcified. There is severe calcifcation of the aortic valve. There is moderate thickening of the aortic valve. Aortic valve regurgitation is not visualized. Severe aortic stenosis is present. Aortic valve mean gradient measures 32.7 mmHg. Aortic valve peak gradient measures 64.1 mmHg. Aortic valve area, by VTI measures 0.53 cm. Pulmonic Valve: The pulmonic valve was normal in structure. Pulmonic valve regurgitation is not visualized. Aorta: The aortic root and ascending aorta are structurally normal, with no evidence of dilitation. Venous: The inferior vena cava is normal in size with less than 50% respiratory variability, suggesting right atrial pressure of 8 mmHg. IAS/Shunts: No atrial level shunt detected by color flow Doppler.  LEFT VENTRICLE PLAX 2D LVIDd:         5.80 cm   Diastology LVIDs:         5.10 cm   LV e' medial:    4.24 cm/s LV PW:         1.40 cm   LV E/e' medial:  29.0 LV IVS:        0.80 cm   LV e' lateral:   4.57 cm/s LVOT diam:     1.70 cm   LV E/e' lateral: 26.9 LV SV:         31 LV SV Index:   14 LVOT Area:     2.27 cm  RIGHT VENTRICLE RV S prime:     8.70 cm/s TAPSE (M-mode): 2.1 cm LEFT ATRIUM             Index        RIGHT ATRIUM           Index LA diam:        3.80 cm 1.67 cm/m   RA Area:     25.60 cm LA Vol (A2C):   33.6 ml 14.75 ml/m  RA Volume:   89.80 ml  39.41 ml/m LA Vol (A4C):   55.6 ml 24.40 ml/m LA Biplane Vol: 45.9 ml 20.15 ml/m  AORTIC VALVE AV Area (Vmax):    0.54 cm AV Area (Vmean):   0.48 cm AV Area (VTI):     0.53 cm AV Vmax:           400.33 cm/s AV  Vmean:          253.333 cm/s AV VTI:            0.592 m AV Peak Grad:      64.1 mmHg AV Mean Grad:      32.7 mmHg LVOT Vmax:         94.70 cm/s LVOT Vmean:        53.400 cm/s LVOT VTI:          0.138 m LVOT/AV VTI ratio: 0.23  AORTA Ao Root diam: 3.00 cm MITRAL VALVE                TRICUSPID VALVE MV Area (PHT): 3.12 cm     TR Peak grad:   65.3 mmHg MV Area VTI:   0.60  cm     TR Vmax:        404.00 cm/s MV Peak grad:  6.2 mmHg MV Mean grad:  4.0 mmHg     SHUNTS MV Vmax:       1.25 m/s     Systemic VTI:  0.14 m MV Vmean:      94.5 cm/s    Systemic Diam: 1.70 cm MV Decel Time: 243 msec MR Peak grad: 2.3 mmHg MR Vmax:      76.00 cm/s MV E velocity: 123.00 cm/s MV A velocity: 116.00 cm/s MV E/A ratio:  1.06 Zoila Shutter MD Electronically signed by Zoila Shutter MD Signature Date/Time: 03/29/2023/1:16:01 PM    Final    CT ABDOMEN PELVIS WO CONTRAST  Result Date: 03/29/2023 CLINICAL DATA:  Acute abdominal pain.  Diarrhea.  Septic shock. EXAM: CT ABDOMEN AND PELVIS WITHOUT CONTRAST TECHNIQUE: Multidetector CT imaging of the abdomen and pelvis was performed following the standard protocol without IV contrast. RADIATION DOSE REDUCTION: This exam was performed according to the departmental dose-optimization program which includes automated exposure control, adjustment of the mA and/or kV according to patient size and/or use of iterative reconstruction technique. COMPARISON:  04/06/2015 FINDINGS: Lower chest: No acute findings. Hepatobiliary: No mass visualized on this unenhanced exam. Gallstones are seen, however there is no evidence of cholecystitis or biliary dilatation. Pancreas: No mass or inflammatory process visualized on this unenhanced exam. Spleen:  Within normal limits in size. Adrenals/Urinary tract: Enlarged kidneys are again seen bilaterally with diffuse involvement by innumerable cysts, many showing mural calcification. Renal masses cannot definitely be excluded on this unenhanced exam. No evidence of  ureteral calculi or hydronephrosis. Urinary bladder is nearly completely empty. Stomach/Bowel: Generalized gaseous distention of stomach and nondependent bowel loops is noted, without transition point. This is most consistent with ileus. No evidence of focal inflammatory process or abscess. Small amount of free fluid noted in right abdomen and pelvis, with diffuse mesenteric and body wall edema. Vascular/Lymphatic: No pathologically enlarged lymph nodes identified. No evidence of abdominal aortic aneurysm. Reproductive:  No mass or other significant abnormality. Other:  None. Musculoskeletal:  No suspicious bone lesions identified. IMPRESSION: Adynamic ileus pattern. No evidence of bowel obstruction or focal inflammatory process. Polycystic kidney disease. Small amount of ascites, with mild diffuse mesenteric and body wall edema. Cholelithiasis. No radiographic evidence of cholecystitis. Electronically Signed   By: Danae Orleans M.D.   On: 03/29/2023 09:35   DG Chest Portable 1 View  Result Date: 03/29/2023 CLINICAL DATA:  57 year old male with history of cough. EXAM: PORTABLE CHEST 1 VIEW COMPARISON:  Chest x-ray 03/19/2015. FINDINGS: Lung volumes are low. Elevation of the right hemidiaphragm, worsened compared to the prior study from 2016. No definite consolidative airspace disease. Mild diffuse interstitial prominence and peribronchial cuffing, increased compared to the remote prior examination. No pleural effusions. No pneumothorax. No evidence of pulmonary edema. Heart size is mildly enlarged. Upper mediastinal contours are within normal limits. Atherosclerotic calcifications are noted in the thoracic aorta. IMPRESSION: 1. Mild diffuse peribronchial cuffing and interstitial prominence, which may suggest an acute bronchitis, although similar findings were evident in 2016 so some element of chronic bronchitis is suspected as well. 2. Severe elevation of the right hemidiaphragm worsened compared to the prior  study suggesting right hemidiaphragm paralysis. 3. Aortic atherosclerosis. Electronically Signed   By: Trudie Reed M.D.   On: 03/29/2023 08:18    Cardiac Studies     Patient Profile     57 y.o. 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  Assessment & Plan   Shock: Lactate 12 on admission.  Significantly elevated procalcitonin.  Blood cultures NGTD. Cardiogenic versus septic.  Currently on low dose Levophed and vasopressin.  Echocardiogram 9/21 showed EF 60 to 65%, grade 1 diastolic dysfunction, moderate RV dysfunction, severe RV enlargement, severe pulmonary hypertension (RVSP 73), status post mitral valve repair, mean gradient 4 mmHg across mitral valve, severe aortic stenosis (mean gradient 33, V-max 4.0 m/s, AVA 0.5 cm, DI 0.23), could not rule out AV vegetation -Given significant RV dysfunction, would consider PE.  Has been started on empiric anticoagulation, would consider CTPA when able -Abx per primary team.  Will plan for TEE to rule out endocarditis given no other clear source and despite negative cultures, would have high suspicion given history of endocarditis and presentation suggesting septic shock and TTE could not r/o endocarditis  Aortic stenosis: Severe AS on echo, will need evaluation for TAVR once recovers from acute illness.  Could not exclude vegetation on TTE.  Plan for TEE.  Follow-up blood cultures, so far no growth.  VT: Required cardioversion for VT on 9/21.  Suspected due to hyperkalemia (K 6.9) and prolonged QTc (612).  Now on CRRT, potassium improved.  Monitor Qtc, remains elevated.  Calcium being repleted.  Avoid QTc prolonging agents  AHRF: remains intubated.  Per PCCM   CRITICAL CARE TIME: I have spent a total of 40 minutes with patient reviewing hospital notes, telemetry, EKGs, labs and examining the patient as well  as establishing an assessment and plan.  Discussed plan with Dr Denese Killings.  > 50% of time was spent in direct patient care. The patient is critically ill with multi-organ system failure and requires high complexity decision making for assessment and support, frequent evaluation and titration of therapies, application of advanced monitoring technologies and extensive interpretation of multiple databases.   For questions or updates, please contact [facility] Please consult [email or web address] for contact info under        Signed, Little Ishikawa, MD  03/31/2023, 8:05 AM

## 2023-03-31 NOTE — Progress Notes (Addendum)
ANTICOAGULATION CONSULT NOTE  Pharmacy Consult for Heparin Indication: ACS vs VTE  No Known Allergies  Patient Measurements: Height: 5\' 11"  (180.3 cm) Weight: 108.5 kg (239 lb 3.2 oz) IBW/kg (Calculated) : 75.3 Heparin Dosing Weight: 100 kg  Vital Signs: Temp: 98.2 F (36.8 C) (09/23 1117) Temp Source: Axillary (09/23 1117) Pulse Rate: 55 (09/23 1200)  Labs: Recent Labs    03/29/23 1158 03/29/23 1350 03/29/23 1912 03/29/23 2120 03/29/23 2352 03/30/23 0134 03/30/23 0205 03/30/23 0312 03/30/23 0359 03/30/23 0555 03/30/23 1200 03/30/23 1250 03/30/23 1252 03/30/23 1600 03/31/23 0224  HGB 11.6*  --  11.8*   < >  --  11.6*  --   --  12.6*  --   --   --  11.9*  --  10.8*  HCT 34.9*  --  37.6*   < >  --  32.7*  --   --  37.0*  --   --   --  35.0*  --  30.4*  PLT 224  --  227  --   --  233  --   --   --   --   --   --   --   --  167  APTT  --   --   --    < >  --   --  67*  --   --   --  65*  --   --   --  75*  LABPROT 20.8*  --   --   --   --   --   --   --   --   --   --   --   --   --   --   INR 1.8*  --   --   --   --   --   --   --   --   --   --   --   --   --   --   HEPARINUNFRC  --   --   --    < >  --   --   --  0.22*  --   --  0.16*  --   --   --  0.21*  CREATININE 10.11*  --  10.23*  --   --   --   --  7.69*  --   --   --  5.71*  --  5.41* 4.31*  TROPONINIHS  --    < > 1,425*   < > 1,293* 1,254*  --   --   --  1,601*  --   --   --   --   --    < > = values in this interval not displayed.    Estimated Creatinine Clearance: 24.3 mL/min (A) (by C-G formula based on SCr of 4.31 mg/dL (H)).   Assessment: Timothy Palmer presenting with shock. Significant cardiac history including MSSA endocarditis, mitral regurgitation, aortic stenosis. Also hx of ESRD and DVT. Presented with elevated troponins and ECHO showed moderate RV dysfunction and RV enlargement. Pharmacy consulted to dose heparin to rule out PE with potential ACS. No AC PTA  TBilirubin 8.6, HL 0.21. APTT therapeutic  at 75. CBC stable, no new bleeding noted. Echo and CT angio pending.  Goal of Therapy:  Heparin level 0.3-0.7 units/ml aPTT 66-102 seconds Monitor platelets by anticoagulation protocol: Yes   Plan:  Continue to use aPTT to guide dosing w/ elevated Tbili Continue heparin at 1700 units/hr  Daily  CBC and heparin level/aPTT Continue to monitor for s/sx of bleeding    Thank you for involving pharmacy in the patient's care.   Theotis Burrow, PharmD PGY1 Acute Care Pharmacy Resident  03/31/2023 12:21 PM

## 2023-03-31 NOTE — Progress Notes (Signed)
Initial Nutrition Assessment  DOCUMENTATION CODES:   Obesity unspecified  INTERVENTION:  - Per CCM MD, can start tube feeds today and advance towards goal:  Initiate tube feeding via OGT (tip in distal stomach): Pivot 1.5 at 55 ml/h (1320 ml per day) *Start at 51mL/hr and advance by 10mL Q12H  Prosource TF20 60 ml BID Provides 2140 kcal, 164 gm protein, 990 ml free water daily  - FWF per CCM/MD.  - Monitor weight trends.   NUTRITION DIAGNOSIS:   Inadequate oral intake related to inability to eat as evidenced by NPO status.  GOAL:   Patient will meet greater than or equal to 90% of their needs  MONITOR:   Vent status, Labs, Weight trends, TF tolerance  REASON FOR ASSESSMENT:   Consult Assessment of nutrition requirement/status  ASSESSMENT:   57 year old male with PMH of ESRD ( T, Th, Sat) , Systolic and diastolic CHF, HTN, Mycotic aneurysm, and PVD. Presented 9/21 with history of 4 days of diarrhea, poor po intake and fatigue with weakness and body aches. Admitted for shock with acute respiratory failure.   9/21 Admit; Intubated  Patient intubated and sedated at time of visit. No family/visitors at bedside.   Per chart review, no weight history since 2021. Weight this admission has fluctuated between 239-251#.   OGT placed, xray verified in the distal stomach.  Per CCM MD Dr. Denese Killings, can start tube feeds today and advance towards goal.    Medications reviewed and include: Colace, Miralax Fentanyl Levophed @ 3 mcg/min Vasopressin @ 0.04 units/min  Labs reviewed:  Na 134 Creatinine 4.31 Phosphorus 5.1 HA1C 5.7 Blood Glucose 113-149 x24 hours   NUTRITION - FOCUSED PHYSICAL EXAM:  Flowsheet Row Most Recent Value  Orbital Region No depletion  Upper Arm Region No depletion  Thoracic and Lumbar Region No depletion  Buccal Region Unable to assess  Temple Region No depletion  Clavicle Bone Region No depletion  Clavicle and Acromion Bone Region No  depletion  Scapular Bone Region Unable to assess  Dorsal Hand No depletion  Patellar Region No depletion  Anterior Thigh Region No depletion  Posterior Calf Region No depletion  Edema (RD Assessment) Mild  Hair Reviewed  Eyes Unable to assess  Mouth Unable to assess  Skin Reviewed  Nails Reviewed       Diet Order:   Diet Order             Diet NPO time specified  Diet effective now                   EDUCATION NEEDS:  Not appropriate for education at this time  Skin:  Skin Assessment: Reviewed RN Assessment  Last BM:  PTA  Height:  Ht Readings from Last 1 Encounters:  03/29/23 5\' 11"  (1.803 m)   Weight:  Wt Readings from Last 1 Encounters:  03/31/23 108.5 kg   Ideal Body Weight:  78.18 kg  BMI:  Body mass index is 33.36 kg/m.  Estimated Nutritional Needs:  Kcal:  2100-2450 kcals Protein:  155-185 grams Fluid:  >/= 2.1L    Shelle Iron RD, LDN For contact information, refer to [facility].

## 2023-03-31 NOTE — Progress Notes (Signed)
NAME:  Timothy Palmer, MRN:  [id number], DOB:  [birth year], LOS: 2 ADMISSION DATE:  03/29/2023, CONSULTATION DATE:  03/29/2023 REFERRING MD: EDP, CHIEF COMPLAINT: Shock  History of Present Illness:  57 year old male patient with history of ESRD ( T, Th, Sat) , Systolic and diastolic CHF, HTN, Mitral valve repair ( 2015) and replacement 2/2 MSSA  endocarditis(2016), Mycotic aneurysm, Upper extremity DVT(2015) and PVD. Presented to the Ed 9/21 with history or 4 days of diarrhea which resolved 9/20, with poor po intake and fatigue with weakness and body aches. In the ED he had Lactate of 11.9, Troponin of 900, bedside echo by ED MD showed decreased EF. Last echo 2016 showed EF of 50-55%. He is not followed by cardiology. He is not on blood thinners as they caused his HD graph to bleed. PCCM were notified by ED MD  and asked to admit the patient and manage care .  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   Significant Hospital Events: Including procedures, antibiotic start and stop dates in addition to other pertinent events   03/29/2023 Admission  - Stated he is weak and tired, has no energy.Currently on 2 L [place] with sats of 100%,HD is due today,  9/21> echo with severe RV enlargement and pulmonary hypertension severe arctic stenosis that is calcified.  Nephrology started CRRT due to shock.  Patient went on pressors.  HD cath placed.  Later intubated.  Also had had wide-complex rhythm agonal respirations but appears did not lose any pulses.  Received cardioversion received stat hyperkalemia treatment.  Status post arterial line insertion  Interim History / Subjective:   No acute events overnight.  Patient evaluated bedside this morning.  Patient is intubated sedated.  Not able to participate in conversation.  Unable to follow any instructions.  Objective   Blood pressure (!) 125/49, pulse (!) 53, temperature (!) 97.2 F (36.2 C), temperature source Axillary, resp. rate 20, height 5\' 11"  (1.803 m), weight 108.5 kg, SpO2 98%.    Vent Mode: PRVC FiO2 (%):  [40 %-50 %] 40 % Set Rate:  [20 bmp-24 bmp] 20 bmp Vt Set:  [600 mL] 600 mL PEEP:  [5 cmH20] 5 cmH20 Plateau Pressure:  [17 cmH20-22 cmH20] 20 cmH20   Intake/Output Summary (Last 24 hours) at 03/31/2023 0805 Last data filed at 03/31/2023 0800 Gross per 24 hour  Intake 1834.1 ml  Output 1793.1 ml  Net 41 ml   Filed Weights   03/29/23 1254 03/30/23 0327 03/31/23 0222  Weight: 114.1 kg 111.7 kg 108.5 kg    Examination: General: In acute distress, intubated and sedated HENT: Normocephalic, atraumatic, ET tube in place Lungs: Clear to auscultation bilaterally Cardiovascular: Regular rate and rhythm, no murmurs, rubs, or gallops Abdomen: Soft, nontender Extremities: 1+ pitting edema to bilateral lower extremities Neuro: Intubated and sedated GU: On CRRT  Resolved Hospital Problem list     Assessment & Plan:   #Undifferentiated shock: Septic  versus cardiogenic #Aortic valve stenosis Echo showing EF of 60 to 65% with showing regional wall motion abnormalities.  Mild LVH.  Moderately reduced right ventricular function.  Elevated right ventricular pressure.  Cannot exclude  aortic valve vegetation.  Calcification of aortic valve.  Continue vancomycin and cefepime.  Given the significant RV dysfunction will need to consider PE.  Will need to obtain CTA.  Still on pressor support.  TEE for vegetation. -TEE did not show evidence of vegetations, but did show significantly decreased right ventricular systolic function -Will obtain CTA chest PE tomorrow as picture does not seem acute, but rather chronic -Will continue with pressor support with vasopressin and Levophed. -Continue hydrocortisone for stress dose steroids, wean down to twice daily -Continue antibiotics for concern for distributive shock -When patient is more hemodynamically stable, will likely need evaluation for TAVR -Cardiology following, appreciate recommendations  #Acute hypoxemic respiratory failure Underlying sepsis.  Will continue on mechanical ventilation until underlying source is treated.  Currently on FiO2 40, PEEP 5.  Will try to treat underlying etiology to see if we can start waking patient up. -Continue mechanical ventilation -VAP bundle -Pulmonary toilet and hygiene -Continue vancomycin and cefepime day 3  #History of V. tach Followed by cardiology.  Required cardioversion on 09/21.  Has been fine since then.  Currently sinus rhythm.  No acute concerns at this time.  Cardiology is following. -Plan per cardiology  #Hypocalcemia Corrected 8.6.  QT prolongation to 576.  Will give calcium today.  #ESRD Nephrology following continue CRRT.  Will start pulling off fluid today.  #QT prolongation Avoid any QTc prolonging agents.   Best Practice (right click and "Reselect all SmartList Selections" daily)   Diet/type: tubefeeds DVT prophylaxis: systemic heparin GI prophylaxis: PPI Lines: Central line Foley:  N/A and Yes, and it is still needed Code Status:  full code  Labs   CBC: Recent Labs  Lab 03/29/23 0800 03/29/23 1118 03/29/23 1158 03/29/23 1912 03/29/23 2120  03/30/23 0134 03/30/23 0359 03/30/23 1252 03/31/23 0224  WBC 22.6*  --  22.1* 23.6*  --  22.2*  --   --  19.6*  NEUTROABS 20.0*  --   --   --   --   --   --   --   --   HGB 12.2*   < > 11.6* 11.8* 12.9* 11.6* 12.6* 11.9* 10.8*  HCT 38.4*   < > 34.9* 37.6* 38.0* 32.7* 37.0* 35.0* 30.4*  MCV 88.9  --  86.6 90.8  --  81.8  --   --  82.6  PLT 236  --  224 227  --  233  --   --  167   < > = values in this interval not displayed.    Basic Metabolic Panel: Recent Labs  Lab  0000 03/29/23 0927 03/29/23 1013 03/29/23 1118 03/29/23 1912 03/29/23 2120 03/30/23 0134 03/30/23 0312 03/30/23 0359 03/30/23 1250 03/30/23 1252 03/30/23 1600 03/31/23 0224  NA  --   --  135   < > 136   < >  --  134* 134* 134* 135 135 134*  K  --   --  5.9*   < > 6.9*   < >  --  6.0* 6.0* 5.1 5.1 5.0 4.0  CL  --   --  89*   < > 89*  --   --  94*  --  94*  --  99 97*  CO2   < >  --  17*  --  14*  --   --  19*  --  20*  --  21* 19*  GLUCOSE  --   --  64*   < > 67*  --   --  133*  --  126*  --  126* 141*  BUN  --   --  70*   < > 79*  --   --  63*  --  51*  --  44* 39*  CREATININE  --   --  10.07*   < > 10.23*  --   --  7.69*  --  5.71*  --  5.41* 4.31*  CALCIUM   < >  --  7.0*  --  7.9*  --   --  6.6*  --  6.7*  --  6.5* 6.8*  MG  --   --  2.6*  --  2.9*  --  2.5*  --   --   --   --   --  2.4  PHOS  --  9.8*  --   --   --   --   --  7.0*  --   --   --  5.8* 5.1*   < > = values in this interval not displayed.   GFR: Estimated Creatinine Clearance: 24.3 mL/min (A) (by C-G formula based on SCr of 4.31 mg/dL (H)). Recent Labs  Lab 03/29/23 1158 03/29/23 1655 03/29/23 1912 03/29/23 2114 03/30/23 0134 03/31/23 0224  PROCALCITON 47.41  --   --   --   --   --   WBC 22.1*  --  23.6*  --  22.2* 19.6*  LATICACIDVEN  --    < > >9.0* 6.7* 4.8* 2.0*   < > = values in this interval not displayed.    Liver Function Tests: Recent Labs  Lab 03/29/23 1013 03/30/23 0312 03/30/23 1600 03/31/23 0224  AST 445*  --    --   --   ALT 96*  --   --   --   ALKPHOS 165*  --   --   --   BILITOT 8.6*  --   --   --   PROT 7.6  --   --   --   ALBUMIN 2.2* 2.1* 1.9* 1.8*   Recent Labs  Lab 03/29/23 1158  LIPASE 41  AMYLASE 71   No results for input(s): "AMMONIA" in the last 168 hours.  ABG    Component Value Date/Time   PHART 7.452 (H) 03/30/2023 1252   PCO2ART 33.2 03/30/2023 1252   PO2ART 166 (H) 03/30/2023 1252   HCO3 23.2 03/30/2023 1252   TCO2 24 03/30/2023 1252   ACIDBASEDEF 5.0 (H) 03/29/2023 2120   O2SAT 100 03/30/2023 1252     Coagulation Profile: Recent Labs  Lab 03/29/23 1158  INR 1.8*    Cardiac Enzymes: No results for input(s): "CKTOTAL", "CKMB", "CKMBINDEX", "TROPONINI" in the last 168 hours.  HbA1C: Hgb A1c MFr Bld  Date/Time Value Ref Range Status  03/29/2023 11:58 AM 5.7 (H) 4.8 - 5.6 % Final    Comment:    (NOTE) Pre diabetes:          5.7%-6.4%  Diabetes:              >6.4%  Glycemic control for   <7.0% adults with diabetes   04/05/2014 04:19 AM 6.0 (H) <5.7 % Final    Comment:    (NOTE)  According to the ADA Clinical Practice Recommendations for 2011, when HbA1c is used as a screening test:  >=6.5%   Diagnostic of Diabetes Mellitus           (if abnormal result is confirmed) 5.7-6.4%   Increased risk of developing Diabetes Mellitus References:Diagnosis and Classification of Diabetes Mellitus,Diabetes Care,2011,34(Suppl 1):S62-S69 and Standards of Medical Care in         Diabetes - 2011,Diabetes Care,2011,34 (Suppl 1):S11-S61.    CBG: Recent Labs  Lab 03/30/23 1559 03/30/23 1913 03/30/23 2310 03/31/23 0309 03/31/23 0754  GLUCAP 121* 113* 139* 149* 143*    Review of Systems:   Negative except for what is stated in HPI  Past Medical History:  He,  has a past medical history of Acute combined systolic and diastolic congestive heart failure (HCC) (03/21/2014), Bacterial  endocarditis - MSSA positive blood cultures with mitral valve vegetation, severe mitral regurgitation, and septic embolization (03/21/2014), Chronic diastolic congestive heart failure (HCC), DVT of upper extremity (deep vein thrombosis) (HCC) (03/04/2014), ESRD (end stage renal disease) on dialysis [facility]), Hypertension, 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   Surgical History:   Past Surgical History:  Procedure Laterality Date   AV FISTULA PLACEMENT Left ?2005   forearm    BASCILIC VEIN TRANSPOSITION Left 12/27/2015   Procedure: FIRST STAGE BASILIC VEIN TRANSPOSITION;  Surgeon: Fransisco Hertz, MD;  Location: [facility] OR;  Service: Vascular;  Laterality: Left;   BASCILIC VEIN TRANSPOSITION Left 02/12/2016   Procedure: SECOND STAGE BASILIC VEIN TRANSPOSITION;  Surgeon: Fransisco Hertz, MD;  Location: [facility] OR;  Service: Vascular;  Laterality: Left;   EMBOLECTOMY Left 03/25/2014   Procedure: EMBOLECTOMY left popliteal;  Surgeon: Larina Earthly, MD;  Location: [facility] OR;  Service: Vascular;  Laterality: Left;   FEMORAL-POPLITEAL BYPASS GRAFT Left 03/25/2014   Procedure: Left Femoral- Below Knee Popliteal Bypass Graft;  Surgeon: Larina Earthly, MD;  Location: [facility] OR;  Service: Vascular;  Laterality: Left;   INTRAOPERATIVE TRANSESOPHAGEAL ECHOCARDIOGRAM N/A 04/05/2014   Procedure: INTRAOPERATIVE TRANSESOPHAGEAL ECHOCARDIOGRAM;  Surgeon: Purcell Nails, MD;  Location: [facility] OR;  Service: Open Heart Surgery;  Laterality: N/A;   LEFT AND RIGHT HEART CATHETERIZATION WITH CORONARY ANGIOGRAM N/A 03/28/2014   Procedure: LEFT AND RIGHT HEART CATHETERIZATION WITH CORONARY ANGIOGRAM;  Surgeon: Laurey Morale, MD;   Location: [facility] CATH LAB;  Service: Cardiovascular;  Laterality: N/A;   MITRAL VALVE REPAIR Right 04/05/2014   Procedure: MINIMALLY INVASIVE MITRAL VALVE REPAIR (MVR);  Surgeon: Purcell Nails, MD;  Location: [facility] OR;  Service: Open Heart Surgery;  Laterality: Right;   MITRAL VALVE REPLACEMENT Right 04/05/2014   Procedure: Bring back MINIMALLY INVASIVE MITRAL VALVE (MV) REPLACEMENT - Reexploration for bleeding;  Surgeon: Purcell Nails, MD;  Location: [facility] OR;  Service: Open Heart Surgery;  Laterality: Right;   PARATHYROIDECTOMY N/A 03/22/2016   Procedure: PARATHYROIDECTOMY AUTOTRANSPLANT;  Surgeon: Darnell Level, MD;  Location: [facility] OR;  Service: General;  Laterality: N/A;   PATCH ANGIOPLASTY Left 07/23/2013   Procedure: PATCH ANGIOPLASTY- LEFT RADIOCEPHALIC ARTERIOVENOUS FISTULA;  Surgeon: Chuck Hint, MD;  Location: [facility] OR;  Service: Vascular;  Laterality: Left;   REVISON OF ARTERIOVENOUS FISTULA Left 11/04/2019   Procedure: REVISON OF LEFT ARM ARTERIOVENOUS FISTULA;  Surgeon: Larina Earthly, MD;  Location: [facility] OR;  Service: Vascular;  Laterality: Left;   SHUNTOGRAM Left November 04, 2011   TEE WITHOUT CARDIOVERSION N/A 03/24/2014   Procedure: TRANSESOPHAGEAL ECHOCARDIOGRAM (TEE);  Surgeon: Laurey Morale, MD;  Location: [facility] ENDOSCOPY;  Service: Cardiovascular;  Laterality: N/A;   TEE WITHOUT CARDIOVERSION N/A 04/12/2015   Procedure: TRANSESOPHAGEAL ECHOCARDIOGRAM (TEE);  Surgeon: Laurey Morale, MD;  Location: [facility] ENDOSCOPY;  Service: Cardiovascular;  Laterality: N/A;   TEE WITHOUT CARDIOVERSION N/A 08/18/2015   Procedure: TRANSESOPHAGEAL ECHOCARDIOGRAM (TEE);  Surgeon: Laurey Morale, MD;  Location: [facility] ENDOSCOPY;  Service: Cardiovascular;  Laterality: N/A;     Social History:   reports that he has never smoked. He has never used smokeless tobacco. He reports current alcohol use. He reports current drug use. Frequency: 7.00 times per week. Drug: Marijuana.   Family History:  His family history  includes Diabetes in his mother; Hypertension in his brother, father, mother, and sister.   Allergies No Known Allergies   Home Medications  Prior to Admission medications   Medication Sig Start Date End Date Taking? Authorizing Provider  lisinopril (PRINIVIL,ZESTRIL) 20 MG tablet Take 20 mg by mouth daily.   Yes [provider]  multivitamin (RENA-VIT) TABS tablet Take 1 tablet by mouth at bedtime. Patient taking differently: Take 1 tablet by mouth 3 (three) times a week. 04/14/15  Yes Hongalgi, Maximino Greenland, MD  sevelamer carbonate (RENVELA) 800 MG tablet Take 1,600 mg by mouth daily.   Yes [provider]  Nutritional Supplements (FEEDING SUPPLEMENT, NEPRO CARB STEADY,) LIQD Take 237 mLs by mouth 2 (two) times daily between meals. Patient not taking: Reported on 11/04/2019 04/14/15   Elease Etienne, MD     Critical care time: 40 mins     Modena Slater, DO Internal Medicine Resident PGY-2 Pager: [phone]

## 2023-03-31 NOTE — Plan of Care (Signed)
  Problem: Nutrition: Goal: Adequate nutrition will be maintained Outcome: Progressing   Problem: Coping: Goal: Level of anxiety will decrease Outcome: Progressing   Problem: Pain Managment: Goal: General experience of comfort will improve Outcome: Progressing   Patient being started on tube feeds. Patient not experiencing anxiety this shift and patient seems to have pain under control at this time.   Problem: Education: Goal: Knowledge of General Education information will improve Description: Including pain rating scale, medication(s)/side effects and non-pharmacologic comfort measures Outcome: Not Progressing   Problem: Health Behavior/Discharge Planning: Goal: Ability to manage health-related needs will improve Outcome: Not Progressing   Problem: Clinical Measurements: Goal: Ability to maintain clinical measurements within normal limits will improve Outcome: Not Progressing Goal: Will remain free from infection Outcome: Not Progressing Goal: Diagnostic test results will improve Outcome: Not Progressing Goal: Respiratory complications will improve Outcome: Not Progressing Goal: Cardiovascular complication will be avoided Outcome: Not Progressing   Problem: Activity: Goal: Risk for activity intolerance will decrease Outcome: Not Progressing   Problem: Elimination: Goal: Will not experience complications related to bowel motility Outcome: Not Progressing   Problem: Safety: Goal: Ability to remain free from injury will improve Outcome: Not Progressing   Problem: Skin Integrity: Goal: Risk for impaired skin integrity will decrease Outcome: Not Progressing   Problem: Safety: Goal: Non-violent Restraint(s) Outcome: Not Progressing   Patient is unable to convey if he understands the information taught to him. Patient is still at risk for infection and respiratory complications due to ETT and CVC. Patient at risk for activity intolerance and skin breakdown due to  inability to move about freely in the bed. Patient still wakes up confused and at high risk for self extubation/removal of important equipment and thus remains in bilateral wrist restraints.    Problem: Elimination: Goal: Will not experience complications related to urinary retention Outcome: Not Applicable  Patient is anuric from ESRD.

## 2023-03-31 NOTE — Progress Notes (Addendum)
[name] NEPHROLOGY PROGRESS NOTE  Assessment/ Plan: Pt is a 57 y.o. yo male   OP HD: [facility] (on hd since 2005)  4.5hr  450/1.5    119kg  2/2 bath  AVF LUA   Heparin 5000 - last OP HD 9/19, post wt 118.4kg  - rocaltrol 0.75 mcg po tts - sensipar 150 mg po tts - no esa (last Hb 11.6 on 9/19)  # Shock septic vs cardiogenic - on IV abx and pressor support.  Plan for TEE by cardiology today.  # Severe AS / RV dysfunction/V tach - by echo, normal LVEF.   # ESRD - [facility]. Had not missed HD. CRRT started 9/21. Continue.  I will change potassium bath today.  We will attempt to UF around 50 cc an hour.  # Hyperkalemia: Currently running 0K bath.  Potassium level improved.  I will change dialysate bath to all 2K.  Monitor lab.  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  Resume binders when he starts eating.  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  Plan for TEE by cardiology today.  Subjective: Seen and examined in ICU.  Getting ready for TEE.  Discussed with the cardiology team and ICU team.  Tolerating CRRT even.  Currently on Levophed and vasopressin. Objective Vital signs in last 24 hours: Vitals:   03/31/23 0915 03/31/23 0930 03/31/23 0945 03/31/23 1000  BP:      Pulse: (!) 54 (!) 56 (!) 58   Resp: 20 19 20 20   Temp:      TempSrc:      SpO2: 100% 100% 100% 100%  Weight:      Height:       Weight change: -0.363 kg  Intake/Output Summary (Last 24 hours) at 03/31/2023 1010 Last data filed at 03/31/2023 1000 Gross per 24 hour  Intake 1784.87 ml  Output 1967.3 ml  Net -182.43 ml       Labs: RENAL PANEL Recent Labs  Lab  0000 03/29/23 0927 03/29/23 1013 03/29/23 1118 03/29/23 1912 03/29/23 2120 03/30/23 0134 03/30/23 0312 03/30/23 0359 03/30/23 1250 03/30/23 1252 03/30/23 1600 03/31/23 0224  NA  --   --  135   < > 136   < >  --  134* 134* 134* 135 135 134*   K  --   --  5.9*   < > 6.9*   < >  --  6.0* 6.0* 5.1 5.1 5.0 4.0  CL  --   --  89*   < > 89*  --   --  94*  --  94*  --  99 97*  CO2   < >  --  17*  --  14*  --   --  19*  --  20*  --  21* 19*  GLUCOSE  --   --  64*   < > 67*  --   --  133*  --  126*  --  126* 141*  BUN  --   --  70*   < > 79*  --   --  63*  --  51*  --  44* 39*  CREATININE  --   --  10.07*   < > 10.23*  --   --  7.69*  --  5.71*  --  5.41* 4.31*  CALCIUM   < >  --  7.0*  --  7.9*  --   --  6.6*  --  6.7*  --  6.5* 6.8*  MG  --   --  2.6*  --  2.9*  --  2.5*  --   --   --   --   --  2.4  PHOS  --  9.8*  --   --   --   --   --  7.0*  --   --   --  5.8* 5.1*  ALBUMIN  --   --  2.2*  --   --   --   --  2.1*  --   --   --  1.9* 1.8*   < > = values in this interval not displayed.    Liver Function Tests: Recent Labs  Lab 03/29/23 1013 03/30/23 0312 03/30/23 1600 03/31/23 0224  AST 445*  --   --   --   ALT 96*  --   --   --   ALKPHOS 165*  --   --   --   BILITOT 8.6*  --   --   --   PROT 7.6  --   --   --   ALBUMIN 2.2* 2.1* 1.9* 1.8*   Recent Labs  Lab 03/29/23 1158  LIPASE 41  AMYLASE 71   No results for input(s): "AMMONIA" in the last 168 hours. CBC: Recent Labs    03/29/23 2120 03/30/23 0134 03/30/23 0359 03/30/23 1252 03/31/23 0224  HGB 12.9* 11.6* 12.6* 11.9* 10.8*  MCV  --  81.8  --   --  82.6    Cardiac Enzymes: No results for input(s): "CKTOTAL", "CKMB", "CKMBINDEX", "TROPONINI" in the last 168 hours. CBG: Recent Labs  Lab 03/30/23 1559 03/30/23 1913 03/30/23 2310 03/31/23 0309 03/31/23 0754  GLUCAP 121* 113* 139* 149* 143*    Iron Studies: No results for input(s): "IRON", "TIBC", "TRANSFERRIN", "FERRITIN" in the last 72 hours. Studies/Results: DG CHEST PORT 1 VIEW  Result Date: 03/31/2023 CLINICAL DATA:  Ventilator support. EXAM: PORTABLE CHEST 1 VIEW COMPARISON:  03/30/2023 FINDINGS: Endotracheal tube tip 5 cm above the carina. Orogastric or nasogastric tube enters the stomach.  Right internal jugular central line tip in the SVC at the as kiss level. Previous mitral valve replacement. Relatively low lung volumes. No pulmonary edema. Chronic calcified pleural plaques on the right. Right pleural scarring. Minimal volume loss in both lower lobes. IMPRESSION: 1. Lines and tubes in good position. 2. Low lung volumes. Minimal volume loss in both lower lobes. 3. Chronic calcified pleural plaques on the right. Electronically Signed   By: Paulina Fusi M.D.   On: 03/31/2023 07:26   DG Chest Port 1 View  Result Date: 03/30/2023 CLINICAL DATA:  57 year old male with history of dyspnea. EXAM: PORTABLE CHEST 1 VIEW COMPARISON:  Chest x-ray 03/29/2023. FINDINGS: An endotracheal tube is in place with tip 5.3 cm above the carina. There is a right-sided internal jugular central venous catheter with tip terminating in the proximal superior vena cava. A nasogastric tube is seen extending into the stomach, however, the tip of the nasogastric tube extends below the lower margin of the image. Transcutaneous defibrillator pads project over the right hemithorax and left upper quadrant of the abdomen. Lung volumes are low. Patchy areas of interstitial prominence an ill-defined opacities are noted in both lungs, most evident in the right upper lobe and left base. No pleural effusions. No pneumothorax. No evidence of pulmonary edema. Heart size is normal. The patient is rotated  to the left on today's exam, resulting in distortion of the mediastinal contours and reduced diagnostic sensitivity and specificity for mediastinal pathology. Mitral annuloplasty ring or bioprosthetic valve noted. Atherosclerotic calcifications are noted in the thoracic aorta. IMPRESSION: 1. Support apparatus, as above. 2. Patchy areas of interstitial prominence an ill-defined opacities concerning for developing multilobar bilateral bronchopneumonia. 3. Aortic atherosclerosis. Electronically Signed   By: Trudie Reed M.D.   On:  03/30/2023 05:49   DG Abd Portable 1V  Result Date: 03/29/2023 CLINICAL DATA:  Orogastric tube placement EXAM: PORTABLE ABDOMEN - 1 VIEW COMPARISON:  03/29/2023 FINDINGS: Orogastric tube tip within the expected distal body of the stomach. Normal abdominal gas pattern. No free intraperitoneal gas. Patchy pulmonary infiltrates noted within the visualized lung bases bilaterally. IMPRESSION: 1. Orogastric tube tip within the expected distal body of the stomach. Electronically Signed   By: Helyn Numbers M.D.   On: 03/29/2023 22:14   DG Abd 1 View  Result Date: 03/29/2023 CLINICAL DATA:  [id number]. Encounter for imaging study to confirm orogastric tube placement. EXAM: ABDOMEN - 1 VIEW COMPARISON:  Earlier study today at 6:44 p.m. FINDINGS: 7:45 p.m. The pelvis is not included in the study. Interval new NGT insertion. The tip is in the upper stomach with the side hole at the GE junction and needs to be advanced further in 10-12 cm. Small bowel dilatation continues in the upper abdomen, maximum caliber 4.3 cm, again concerning for small bowel obstruction or worsening ileus since the CT from this morning. Small bowel dilatation was greater in the lower abdomen on the last film. This area is not evaluated. Electrical pads overlie the lower chest and there is overlying wiring. No supine evidence of free air. IMPRESSION: 1. NGT tip is in the upper stomach with the side hole at the GE junction and needs to be advanced further in 10-12 cm. 2. Persistent small bowel dilatation, maximum upper abdominal caliber 4.3 cm, concerning for small bowel obstruction or worsening ileus. Electronically Signed   By: Almira Bar M.D.   On: 03/29/2023 20:30   DG Abd 1 View  Result Date: 03/29/2023 CLINICAL DATA:  [id number] with abdominal distention. EXAM: ABDOMEN - 1 VIEW COMPARISON:  CT without contrast today at 9:23 a.m. FINDINGS: Generalized worsening small-bowel dilatation, maximum caliber was previously 2.7 cm now 5.5 cm worrisome  for obstruction or worsening severe ileus. Scattered colonic aeration remains present at least to the distal descending colon. A rim calcified right renal cyst again superimposes over the lateral right iliac crest. There are vascular and vasa deferentia calcifications. No supine evidence of free air. No acute osseous findings. IMPRESSION: Generalized worsening small-bowel dilatation, maximum caliber was previously 2.7 cm now 5.5 cm. Findings are worrisome for small bowel obstruction or worsening severe ileus. Electronically Signed   By: Almira Bar M.D.   On: 03/29/2023 20:24   DG Chest Port 1 View  Result Date: 03/29/2023 CLINICAL DATA:  [id number].  History of ETT. EXAM: PORTABLE CHEST 1 VIEW COMPARISON:  Earlier study today at 6:40 p.m. FINDINGS: 7:23 p.m. there is overlying wiring and newly noted electrical pads. Interval intubation with ETT tip 2.8 cm from carina. Right IJ catheter interval pullback to the junction of the right IJ/subclavian vein, previously in the upper SVC. Stable cardiomegaly with AVR. Central vessels are normal caliber. Low lung volumes. Increased scattered heterogeneous opacities left mid to lower lung fields which could be atelectasis or pneumonia. Small right pleural effusion again noted with elevated hemidiaphragm, right mid perihilar  linear atelectasis. Stable mediastinum with widening superiorly, aortic atherosclerosis. No new osseous finding.  In all other respects no further changes. IMPRESSION: 1. Interval intubation with ETT tip 2.8 cm from carina. 2. Right IJ catheter interval pullback to the junction of the right IJ/subclavian vein. 3. Increased left mid to lower lung heterogeneous opacities which could be atelectasis or pneumonia. 4. Unchanged small right pleural effusion.  Cardiomegaly. Electronically Signed   By: Almira Bar M.D.   On: 03/29/2023 20:18   DG CHEST PORT 1 VIEW  Result Date: 03/29/2023 CLINICAL DATA:  [id number] Encounter for central line placement [id number]  EXAM: PORTABLE CHEST 1 VIEW COMPARISON:  03/29/2023 FINDINGS: Interval placement of right internal jugular approach central venous catheter with distal tip terminating at the level of the mid SVC. Stable cardiomegaly. Aortic atherosclerosis. Low lung volumes with elevation of the right hemidiaphragm. Similar bilateral interstitial prominence. No pneumothorax. IMPRESSION: Interval placement of right internal jugular approach central venous catheter with distal tip terminating at the level of the mid SVC. No pneumothorax. Electronically Signed   By: Duanne Guess D.O.   On: 03/29/2023 19:33   ECHOCARDIOGRAM COMPLETE  Result Date: 03/29/2023    ECHOCARDIOGRAM REPORT   Patient Name:   Timothy Palmer [facility] Date of Exam: 03/29/2023 Medical Rec #:  [id number]      Height:       71.0 in Accession #:    [id number]     Weight:       240.0 lb Date of Birth:  Nov 09, 1965     BSA:          2.278 m Patient Age:    56 years       BP:           90/63 mmHg Patient Gender: M              HR:           63 bpm. Exam Location:  Inpatient Procedure: 2D Echo, Color Doppler and Cardiac Doppler Indications:    Shock  History:        Patient has prior history of Echocardiogram examinations, most                 recent 08/19/2015. Risk Factors:Hypertension. 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                  Mitral Valve: 28 mm Sorin Mitral Memo 3D prosthetic annuloplasty                 ring valve is present in the mitral position.  Sonographer:    Aron Baba Referring Phys: [id number] SARAH F GROCE IMPRESSIONS  1. Left ventricular ejection fraction, by estimation, is 60 to 65%. The left ventricle has normal function. The left ventricle demonstrates regional wall motion abnormalities (see scoring diagram/findings for description). There is mild left ventricular  hypertrophy. Left ventricular diastolic parameters are consistent with Grade I diastolic dysfunction (impaired relaxation).  Elevated left ventricular end-diastolic pressure. There is the interventricular septum is flattened in systole and diastole, consistent with right ventricular pressure and volume overload.  2. Right ventricular systolic function is moderately reduced. The right ventricular size is severely enlarged. There is severely elevated pulmonary artery systolic pressure. The estimated right ventricular systolic pressure is 73.3 mmHg.  3. Left atrial size was mildly dilated.  4. Right atrial  size was moderately dilated.  5. The mitral valve has been repaired/replaced. Trivial mitral valve regurgitation. No evidence of mitral stenosis. The mean mitral valve gradient is 4.0 mmHg with average heart rate of 68 bpm. There is a 28 mm Sorin Mitral Memo 3D prosthetic annuloplasty ring present in the mitral position.  6. The tricuspid valve is abnormal.  7. Cannot exclude aortic valve vegetation. The aortic valve is calcified. There is severe calcifcation of the aortic valve. There is moderate thickening of the aortic valve. Aortic valve regurgitation is not visualized. Severe aortic valve stenosis. Aortic valve area, by VTI measures 0.53 cm. Aortic valve mean gradient measures 32.7 mmHg. Aortic valve Vmax measures 4.00 m/s. Peak gradient 64 mmHg, DI is 0.23.  8. The inferior vena cava is normal in size with <50% respiratory variability, suggesting right atrial pressure of 8 mmHg. Comparison(s): Changes from prior study are noted. 08/19/2015: LVEF 55-60%, MV s/p repair, no stenosis, mild AS. Conclusion(s)/Recommendation(s): Critical findings reported to Dr. Delton Coombes and acknowledged at 1:15 pm. FINDINGS  Left Ventricle: Left ventricular ejection fraction, by estimation, is 60 to 65%. The left ventricle has normal function. The left ventricle demonstrates regional wall motion abnormalities. The left ventricular internal cavity size was small. There is mild left ventricular hypertrophy. The interventricular septum is flattened in systole  and diastole, consistent with right ventricular pressure and volume overload. Left ventricular diastolic parameters are consistent with Grade I diastolic dysfunction (impaired relaxation). Elevated left ventricular end-diastolic pressure. Right Ventricle: The right ventricular size is severely enlarged. No increase in right ventricular wall thickness. Right ventricular systolic function is moderately reduced. There is severely elevated pulmonary artery systolic pressure. The tricuspid regurgitant velocity is 4.04 m/s, and with an assumed right atrial pressure of 8 mmHg, the estimated right ventricular systolic pressure is 73.3 mmHg. Left Atrium: Left atrial size was mildly dilated. Right Atrium: Right atrial size was moderately dilated. Pericardium: There is no evidence of pericardial effusion. Mitral Valve: The mitral valve has been repaired/replaced. Trivial mitral valve regurgitation. There is a 28 mm Sorin Mitral Memo 3D prosthetic annuloplasty ring present in the mitral position. No evidence of mitral valve stenosis. MV peak gradient, 6.2 mmHg. The mean mitral valve gradient is 4.0 mmHg with average heart rate of 68 bpm. Tricuspid Valve: The tricuspid valve is abnormal. Tricuspid valve regurgitation is mild. Aortic Valve: Cannot exclude aortic valve vegetation. The aortic valve is calcified. There is severe calcifcation of the aortic valve. There is moderate thickening of the aortic valve. Aortic valve regurgitation is not visualized. Severe aortic stenosis is present. Aortic valve mean gradient measures 32.7 mmHg. Aortic valve peak gradient measures 64.1 mmHg. Aortic valve area, by VTI measures 0.53 cm. Pulmonic Valve: The pulmonic valve was normal in structure. Pulmonic valve regurgitation is not visualized. Aorta: The aortic root and ascending aorta are structurally normal, with no evidence of dilitation. Venous: The inferior vena cava is normal in size with less than 50% respiratory variability, suggesting  right atrial pressure of 8 mmHg. IAS/Shunts: No atrial level shunt detected by color flow Doppler.  LEFT VENTRICLE PLAX 2D LVIDd:         5.80 cm   Diastology LVIDs:         5.10 cm   LV e' medial:    4.24 cm/s LV PW:         1.40 cm   LV E/e' medial:  29.0 LV IVS:        0.80 cm   LV e'  lateral:   4.57 cm/s LVOT diam:     1.70 cm   LV E/e' lateral: 26.9 LV SV:         31 LV SV Index:   14 LVOT Area:     2.27 cm  RIGHT VENTRICLE RV S prime:     8.70 cm/s TAPSE (M-mode): 2.1 cm LEFT ATRIUM             Index        RIGHT ATRIUM           Index LA diam:        3.80 cm 1.67 cm/m   RA Area:     25.60 cm LA Vol (A2C):   33.6 ml 14.75 ml/m  RA Volume:   89.80 ml  39.41 ml/m LA Vol (A4C):   55.6 ml 24.40 ml/m LA Biplane Vol: 45.9 ml 20.15 ml/m  AORTIC VALVE AV Area (Vmax):    0.54 cm AV Area (Vmean):   0.48 cm AV Area (VTI):     0.53 cm AV Vmax:           400.33 cm/s AV Vmean:          253.333 cm/s AV VTI:            0.592 m AV Peak Grad:      64.1 mmHg AV Mean Grad:      32.7 mmHg LVOT Vmax:         94.70 cm/s LVOT Vmean:        53.400 cm/s LVOT VTI:          0.138 m LVOT/AV VTI ratio: 0.23  AORTA Ao Root diam: 3.00 cm MITRAL VALVE                TRICUSPID VALVE MV Area (PHT): 3.12 cm     TR Peak grad:   65.3 mmHg MV Area VTI:   0.60 cm     TR Vmax:        404.00 cm/s MV Peak grad:  6.2 mmHg MV Mean grad:  4.0 mmHg     SHUNTS MV Vmax:       1.25 m/s     Systemic VTI:  0.14 m MV Vmean:      94.5 cm/s    Systemic Diam: 1.70 cm MV Decel Time: 243 msec MR Peak grad: 2.3 mmHg MR Vmax:      76.00 cm/s MV E velocity: 123.00 cm/s MV A velocity: 116.00 cm/s MV E/A ratio:  1.06 Zoila Shutter MD Electronically signed by Zoila Shutter MD Signature Date/Time: 03/29/2023/1:16:01 PM    Final     Medications: Infusions:  ceFEPime (MAXIPIME) IV 200 mL/hr at 03/31/23 1000   fentaNYL infusion INTRAVENOUS 200 mcg/hr (03/31/23 1000)   heparin 1,700 Units/hr (03/31/23 1000)   norepinephrine (LEVOPHED) Adult infusion 2.5  mcg/min (03/31/23 1000)   prismasol BGK 0/2.5 400 mL/hr at 03/30/23 2158   prismasol BGK 0/2.5 400 mL/hr at 03/30/23 2158   prismasol BGK 0/2.5 1,500 mL/hr at 03/31/23 0823   vancomycin Stopped (03/31/23 0002)   vasopressin 0.04 Units/min (03/31/23 1000)    Scheduled Medications:  [START ON 04/01/2023] calcitRIOL  0.75 mcg Per Tube Q T,Th,Sat-1800   Chlorhexidine Gluconate Cloth  6 each Topical Daily   docusate  100 mg Per Tube BID   hydrocortisone sod succinate (SOLU-CORTEF) inj  100 mg Intravenous Q8H   mouth rinse  15 mL Mouth Rinse Q2H   pantoprazole (PROTONIX) IV  40 mg  Intravenous Daily   polyethylene glycol  17 g Per Tube Daily    have reviewed scheduled and prn medications.  Physical Exam: General: Ill looking male, intubated, sedated. Heart:RRR, s1s2 nl Lungs: Coarse breath sound bilateral. Abdomen:soft, Non-tender, non-distended Extremities: Trace peripheral edema Dialysis Access: L AVF.  RIJ temp hD cath placed on 9/21.   Nakeysha Pasqual Prasad Jermey Closs 03/31/2023,10:10 AM  LOS: 2 days

## 2023-04-01 ENCOUNTER — Encounter (HOSPITAL_COMMUNITY)
Admission: EM | Disposition: A | Discharge status: Hospice | Payer: Self-pay | Source: Home / Self Care | Attending: Pulmonary Disease

## 2023-04-01 ENCOUNTER — Inpatient Hospital Stay (HOSPITAL_COMMUNITY): Payer: Medicare Other

## 2023-04-01 DIAGNOSIS — R9431 Abnormal electrocardiogram [ECG] [EKG]: Secondary | ICD-10-CM | POA: Diagnosis not present

## 2023-04-01 DIAGNOSIS — R570 Cardiogenic shock: Secondary | ICD-10-CM | POA: Diagnosis not present

## 2023-04-01 DIAGNOSIS — R579 Shock, unspecified: Secondary | ICD-10-CM | POA: Diagnosis not present

## 2023-04-01 DIAGNOSIS — J9601 Acute respiratory failure with hypoxia: Secondary | ICD-10-CM | POA: Diagnosis not present

## 2023-04-01 DIAGNOSIS — I350 Nonrheumatic aortic (valve) stenosis: Secondary | ICD-10-CM | POA: Diagnosis not present

## 2023-04-01 HISTORY — PX: CENTRAL LINE INSERTION: CATH118232

## 2023-04-01 HISTORY — PX: RIGHT HEART CATH: CATH118263

## 2023-04-01 LAB — POCT I-STAT EG7
Acid-base deficit: 1 mmol/L (ref 0.0–2.0)
Bicarbonate: 24.9 mmol/L (ref 20.0–28.0)
Calcium, Ion: 0.97 mmol/L — ABNORMAL LOW (ref 1.15–1.40)
HCT: 37 % — ABNORMAL LOW (ref 39.0–52.0)
Hemoglobin: 12.6 g/dL — ABNORMAL LOW (ref 13.0–17.0)
O2 Saturation: 66 %
Potassium: 3.5 mmol/L (ref 3.5–5.1)
Sodium: 138 mmol/L (ref 135–145)
TCO2: 26 mmol/L (ref 22–32)
pCO2, Ven: 45.3 mmHg (ref 44–60)
pH, Ven: 7.348 (ref 7.25–7.43)
pO2, Ven: 36 mmHg (ref 32–45)

## 2023-04-01 LAB — GLUCOSE, CAPILLARY
Glucose-Capillary: 215 mg/dL — ABNORMAL HIGH (ref 70–99)
Glucose-Capillary: 203 mg/dL — ABNORMAL HIGH (ref 70–99)
Glucose-Capillary: 168 mg/dL — ABNORMAL HIGH (ref 70–99)
Glucose-Capillary: 133 mg/dL — ABNORMAL HIGH (ref 70–99)
Glucose-Capillary: 125 mg/dL — ABNORMAL HIGH (ref 70–99)
Glucose-Capillary: 155 mg/dL — ABNORMAL HIGH (ref 70–99)

## 2023-04-01 LAB — CBC
HCT: 30.8 % — ABNORMAL LOW (ref 39.0–52.0)
Hemoglobin: 10.5 g/dL — ABNORMAL LOW (ref 13.0–17.0)
MCH: 28.1 pg (ref 26.0–34.0)
MCHC: 34.1 g/dL (ref 30.0–36.0)
MCV: 82.4 fL (ref 80.0–100.0)
Platelets: 156 10*3/uL (ref 150–400)
RBC: 3.74 MIL/uL — ABNORMAL LOW (ref 4.22–5.81)
RDW: 16.5 % — ABNORMAL HIGH (ref 11.5–15.5)
WBC: 18.6 10*3/uL — ABNORMAL HIGH (ref 4.0–10.5)
nRBC: 4 % — ABNORMAL HIGH (ref 0.0–0.2)

## 2023-04-01 LAB — HEPARIN LEVEL (UNFRACTIONATED)
Heparin Unfractionated: 0.17 IU/mL — ABNORMAL LOW (ref 0.30–0.70)
Heparin Unfractionated: 0.28 IU/mL — ABNORMAL LOW (ref 0.30–0.70)

## 2023-04-01 LAB — HEPATIC FUNCTION PANEL
ALT: 550 U/L — ABNORMAL HIGH (ref 0–44)
AST: 1061 U/L — ABNORMAL HIGH (ref 15–41)
Albumin: 1.8 g/dL — ABNORMAL LOW (ref 3.5–5.0)
Alkaline Phosphatase: 155 U/L — ABNORMAL HIGH (ref 38–126)
Bilirubin, Direct: 5.2 mg/dL — ABNORMAL HIGH (ref 0.0–0.2)
Indirect Bilirubin: 3.9 mg/dL — ABNORMAL HIGH (ref 0.3–0.9)
Total Bilirubin: 9.1 mg/dL — ABNORMAL HIGH (ref 0.3–1.2)
Total Protein: 7.2 g/dL (ref 6.5–8.1)

## 2023-04-01 LAB — COOXEMETRY PANEL
Carboxyhemoglobin: 1.7 % — ABNORMAL HIGH (ref 0.5–1.5)
Methemoglobin: 1 % (ref 0.0–1.5)
O2 Saturation: 60 %
Total hemoglobin: 11.4 g/dL — ABNORMAL LOW (ref 12.0–16.0)

## 2023-04-01 LAB — RENAL FUNCTION PANEL
Albumin: 1.8 g/dL — ABNORMAL LOW (ref 3.5–5.0)
Albumin: 1.8 g/dL — ABNORMAL LOW (ref 3.5–5.0)
Anion gap: 15 (ref 5–15)
Anion gap: 18 — ABNORMAL HIGH (ref 5–15)
BUN: 35 mg/dL — ABNORMAL HIGH (ref 6–20)
BUN: 33 mg/dL — ABNORMAL HIGH (ref 6–20)
CO2: 19 mmol/L — ABNORMAL LOW (ref 22–32)
CO2: 21 mmol/L — ABNORMAL LOW (ref 22–32)
Calcium: 7.1 mg/dL — ABNORMAL LOW (ref 8.9–10.3)
Calcium: 7.7 mg/dL — ABNORMAL LOW (ref 8.9–10.3)
Chloride: 97 mmol/L — ABNORMAL LOW (ref 98–111)
Chloride: 97 mmol/L — ABNORMAL LOW (ref 98–111)
Creatinine, Ser: 2.8 mg/dL — ABNORMAL HIGH (ref 0.61–1.24)
Creatinine, Ser: 2.42 mg/dL — ABNORMAL HIGH (ref 0.61–1.24)
GFR, Estimated: 26 mL/min — ABNORMAL LOW (ref >60)
GFR, Estimated: 31 mL/min — ABNORMAL LOW (ref >60)
Glucose, Bld: 221 mg/dL — ABNORMAL HIGH (ref 70–99)
Glucose, Bld: 145 mg/dL — ABNORMAL HIGH (ref 70–99)
Phosphorus: 3.7 mg/dL (ref 2.5–4.6)
Phosphorus: 3.1 mg/dL (ref 2.5–4.6)
Potassium: 3.5 mmol/L (ref 3.5–5.1)
Potassium: 3.4 mmol/L — ABNORMAL LOW (ref 3.5–5.1)
Sodium: 131 mmol/L — ABNORMAL LOW (ref 135–145)
Sodium: 136 mmol/L (ref 135–145)

## 2023-04-01 LAB — MAGNESIUM
Magnesium: 2.6 mg/dL — ABNORMAL HIGH (ref 1.7–2.4)
Magnesium: 2.6 mg/dL — ABNORMAL HIGH (ref 1.7–2.4)

## 2023-04-01 LAB — APTT
aPTT: 57 seconds — ABNORMAL HIGH (ref 24–36)
aPTT: 82 seconds — ABNORMAL HIGH (ref 24–36)

## 2023-04-01 SURGERY — RIGHT HEART CATH
Anesthesia: LOCAL

## 2023-04-01 MED ORDER — PRISMASOL BGK 4/2.5 32-4-2.5 MEQ/L REPLACEMENT SOLN
Status: DC
  Filled 2023-04-01 (×19): qty 5000

## 2023-04-01 MED ORDER — MIDAZOLAM HCL 2 MG/2ML IJ SOLN
INTRAMUSCULAR | Status: AC
  Filled 2023-04-01: qty 2

## 2023-04-01 MED ORDER — LIDOCAINE HCL (PF) 1 % IJ SOLN
INTRAMUSCULAR | Status: AC
  Filled 2023-04-01: qty 30

## 2023-04-01 MED ORDER — MILRINONE LACTATE IN DEXTROSE 20-5 MG/100ML-% IV SOLN
0.1250 ug/kg/min | INTRAVENOUS | Status: DC
  Administered 2023-04-01 – 2023-04-08 (×8): 0.125 ug/kg/min via INTRAVENOUS
  Filled 2023-04-01 (×10): qty 100

## 2023-04-01 MED ORDER — HEPARIN (PORCINE) IN NACL 1000-0.9 UT/500ML-% IV SOLN
INTRAVENOUS | Status: DC | PRN
  Administered 2023-04-01: 500 mL

## 2023-04-01 MED ORDER — MIDAZOLAM HCL 2 MG/2ML IJ SOLN
INTRAMUSCULAR | Status: DC | PRN
  Administered 2023-04-01: 2 mg via INTRAVENOUS

## 2023-04-01 MED ORDER — SODIUM CHLORIDE 0.9 % IV SOLN: INTRAVENOUS | Status: DC

## 2023-04-01 MED ORDER — LIDOCAINE HCL (PF) 1 % IJ SOLN
INTRAMUSCULAR | Status: DC | PRN
  Administered 2023-04-01: 5 mL

## 2023-04-01 MED ORDER — HEPARIN BOLUS VIA INFUSION
1500.0000 [IU] | Freq: Once | INTRAVENOUS | Status: AC
  Administered 2023-04-01: 1500 [IU] via INTRAVENOUS
  Filled 2023-04-01: qty 1500

## 2023-04-01 SURGICAL SUPPLY — 10 items
CATH SWAN GANZ 7F STRAIGHT (CATHETERS) IMPLANT
GUIDEWIRE ANGLED .035X150CM (WIRE) IMPLANT
KIT MICROPUNCTURE NIT STIFF (SHEATH) IMPLANT
MAT PREVALON FULL STRYKER (MISCELLANEOUS) IMPLANT
PACK CARDIAC CATHETERIZATION (CUSTOM PROCEDURE TRAY) ×2 IMPLANT
SHEATH GLIDE SLENDER 4/5FR (SHEATH) IMPLANT
SHEATH PINNACLE 7F 10CM (SHEATH) IMPLANT
SHEATH PROBE COVER 6X72 (BAG) IMPLANT
TRANSDUCER W/STOPCOCK (MISCELLANEOUS) IMPLANT
TUBING ART PRESS 72 MALE/FEM (TUBING) IMPLANT

## 2023-04-01 NOTE — Progress Notes (Addendum)
Rounding Note    Patient Name: Timothy Palmer Date of Encounter: 04/01/2023   [facility] Cardiologist: Little Ishikawa, MD   Subjective   Net negative 1L yesterday on CRRT. Intubated, sedated, not following commands.  On levophed at 5 mcg/min and vasopressin.  FiO2 50%, PEEP 5.  Inpatient Medications    Scheduled Meds:  calcitRIOL  0.75 mcg Per Tube Q T,Th,Sat-1800   Chlorhexidine Gluconate Cloth  6 each Topical Daily   docusate  100 mg Per Tube BID   feeding supplement (PROSource TF20)  60 mL Per Tube BID   hydrocortisone sod succinate (SOLU-CORTEF) inj  100 mg Intravenous Q12H   insulin aspart  0-15 Units Subcutaneous Q4H   mouth rinse  15 mL Mouth Rinse Q2H   pantoprazole (PROTONIX) IV  40 mg Intravenous Daily   polyethylene glycol  17 g Per Tube Daily   Continuous Infusions:  ceFEPime (MAXIPIME) IV Stopped (03/31/23 2033)   feeding supplement (PIVOT 1.5 CAL) 30 mL/hr at 04/01/23 0700   fentaNYL infusion INTRAVENOUS 125 mcg/hr (04/01/23 0700)   heparin 1,700 Units/hr (04/01/23 0700)   norepinephrine (LEVOPHED) Adult infusion 4 mcg/min (04/01/23 0700)   prismasol BGK 2/2.5 dialysis solution 1,500 mL/hr at 04/01/23 0415   prismasol BGK 2/2.5 replacement solution 400 mL/hr at 04/01/23 0033   prismasol BGK 2/2.5 replacement solution 400 mL/hr at 04/01/23 0031   vancomycin Stopped (04/01/23 0010)   vasopressin 0.04 Units/min (04/01/23 0700)   PRN Meds: docusate, fentaNYL, Gerhardt's butt cream, heparin, lip balm, midazolam, mouth rinse, polyethylene glycol   Vital Signs    Vitals:   04/01/23 0645 04/01/23 0700 04/01/23 0732 04/01/23 0800  BP:      Pulse: 61 61 60 64  Resp: 20 20 20 15   Temp:   100 F (37.8 C)   TempSrc:   Axillary   SpO2: 100% 100% 100% 99%  Weight:      Height:        Intake/Output Summary (Last 24 hours) at 04/01/2023 0807 Last data filed at 04/01/2023 0700 Gross per 24 hour  Intake 2153.36 ml  Output 3127.4 ml  Net  -974.04 ml      04/01/2023    1:28 AM 03/31/2023    2:22 AM 03/30/2023    3:27 AM  Last 3 Weights  Weight (lbs) 239 lb 3.2 oz 239 lb 3.2 oz 246 lb 4.1 oz  Weight (kg) 108.5 kg 108.5 kg 111.7 kg      Telemetry    NSR, brief NSVT, PACs/PVCs - Personally Reviewed  ECG    Sinus, incomplete right bundle branch block, right axis deviation, diffuse T wave inversions, QTc 560- Personally Reviewed  Physical Exam   GEN: intubated, sedated  Neck: No JVD appreciated Cardiac: RRR, 3/6 systolic murmur Respiratory: Clear to auscultation bilaterally in anterior fields GI: Soft, nontender, non-distended  MS: No edema Neuro:  No following commands Psych: Unable to assess  Labs    High Sensitivity Troponin:   Recent Labs  Lab 03/29/23 1912 03/29/23 2214 03/29/23 2352 03/30/23 0134 03/30/23 0555  TROPONINIHS 1,425* 1,143* 1,293* 1,254* 1,601*     Chemistry Recent Labs  Lab 03/29/23 1013 03/29/23 1118 03/31/23 0224 03/31/23 1604 04/01/23 0417  NA 135   < > 134* 134* 131*  K 5.9*   < > 4.0 3.8 3.5  CL 89*   < > 97* 98 97*  CO2 17*   < > 19* 19* 19*  GLUCOSE 64*   < > 141*  155* 221*  BUN 70*   < > 39* 35* 35*  CREATININE 10.07*   < > 4.31* 3.34* 2.80*  CALCIUM 7.0*   < > 6.8* 7.1* 7.1*  MG 2.6*   < > 2.4 2.4 2.6*  PROT 7.6  --   --   --   --   ALBUMIN 2.2*   < > 1.8* 1.8* 1.8*  AST 445*  --   --   --   --   ALT 96*  --   --   --   --   ALKPHOS 165*  --   --   --   --   BILITOT 8.6*  --   --   --   --   GFRNONAA 6*   < > 15* 21* 26*  ANIONGAP 29*   < > 18* 17* 15   < > = values in this interval not displayed.    Lipids No results for input(s): "CHOL", "TRIG", "HDL", "LABVLDL", "LDLCALC", "CHOLHDL" in the last 168 hours.  Hematology Recent Labs  Lab 03/30/23 0134 03/30/23 0359 03/30/23 1252 03/31/23 0224 04/01/23 0417  WBC 22.2*  --   --  19.6* 18.6*  RBC 4.00*  --   --  3.68* 3.74*  HGB 11.6*   < > 11.9* 10.8* 10.5*  HCT 32.7*   < > 35.0* 30.4* 30.8*  MCV 81.8   --   --  82.6 82.4  MCH 29.0  --   --  29.3 28.1  MCHC 35.5  --   --  35.5 34.1  RDW 16.6*  --   --  15.9* 16.5*  PLT 233  --   --  167 156   < > = values in this interval not displayed.   Thyroid No results for input(s): "TSH", "FREET4" in the last 168 hours.  BNP Recent Labs  Lab 03/29/23 1158  BNP 2,670.6*    DDimer No results for input(s): "DDIMER" in the last 168 hours.   Radiology    ECHO TEE  Result Date: 03/31/2023    TRANSESOPHOGEAL ECHO REPORT   Patient Name:   ABDULRHMAN AVINO [facility] Date of Exam: 03/31/2023 Medical Rec #:  [id number]      Height:       71.0 in Accession #:    [id number]     Weight:       239.2 lb Date of Birth:  1966/05/03     BSA:          2.275 m Patient Age:    57 years       BP:           125/49 mmHg Patient Gender: M              HR:           58 bpm. Exam Location:  Inpatient Procedure: Transesophageal Echo, Color Doppler, Cardiac Doppler and 3D Echo Indications:     Endocarditis  History:         Patient has prior history of Echocardiogram examinations, most                  recent 03/29/2023. Right Heart Failure, Prior Cardiac Surgery,                  ESRD, Aortic Valve Disease, Mitral Valve Disease, Endocarditis,                  Prosthetic Valve Complications and Minimally Invasive MV  repair; Risk Factors:Hypotension.                   Mitral Valve: prosthetic annuloplasty ring valve is present in                  the mitral position.  Sonographer:     [facility] Referring Phys:  [id number] Little Ishikawa Diagnosing Phys: Epifanio Lesches MD  Sonographer Comments: TEE probe [phone] used for procedure. PROCEDURE: After discussion of the risks and benefits of a TEE, an informed consent was obtained from a family member. The transesophogeal probe was passed without difficulty through the esophogus of the patient. Imaged were obtained with the patient in a supine position. Sedation performed by different physician. Patients was under conscious sedation  during this procedure. Anesthetic administered: of Fentanyl, 4.0mg  of Versed. Image quality was good. The patient's vital signs; including heart rate, blood pressure, and oxygen saturation; remained stable throughout the procedure. The patient developed no complications during the procedure.  IMPRESSIONS  1. Left ventricular ejection fraction, by estimation, is 55 to 60%. The left ventricle has normal function.  2. Right ventricular systolic function is severely reduced. The right ventricular size is severely enlarged. There is severely elevated pulmonary artery systolic pressure.  3. No left atrial/left atrial appendage thrombus was detected.  4. Right atrial size was moderately dilated.  5. The mitral valve has been repaired/replaced. Mild mitral valve regurgitation. The mean mitral valve gradient is 4.0 mmHg. There is a prosthetic annuloplasty ring present in the mitral position.  6. Tricuspid valve regurgitation is mild to moderate.  7. The aortic valve is tricuspid. There is severe calcifcation of the aortic valve. Aortic valve regurgitation is not visualized. Severe aortic valve stenosis. Aortic valve mean gradient measures 40.0 mmHg. Aortic valve Vmax measures 4.13 m/s.  8. No vegetation seen FINDINGS  Left Ventricle: Left ventricular ejection fraction, by estimation, is 55 to 60%. The left ventricle has normal function. The left ventricular internal cavity size was normal in size. Right Ventricle: The right ventricular size is severely enlarged. Right vetricular wall thickness was not well visualized. Right ventricular systolic function is severely reduced. There is severely elevated pulmonary artery systolic pressure. The tricuspid regurgitant velocity is 4.36 m/s, and with an assumed right atrial pressure of 8 mmHg, the estimated right ventricular systolic pressure is 84.0 mmHg. Left Atrium: Left atrial size was normal in size. No left atrial/left atrial appendage thrombus was detected. Right  Atrium: Right atrial size was moderately dilated. Pericardium: There is no evidence of pericardial effusion. Mitral Valve: The mitral valve has been repaired/replaced. Mild mitral valve regurgitation. There is a prosthetic annuloplasty ring present in the mitral position. MV peak gradient, 8.0 mmHg. The mean mitral valve gradient is 4.0 mmHg with average heart rate of 59 bpm. Tricuspid Valve: The tricuspid valve is normal in structure. Tricuspid valve regurgitation is mild to moderate. Aortic Valve: The aortic valve is tricuspid. There is severe calcifcation of the aortic valve. Aortic valve regurgitation is not visualized. Severe aortic stenosis is present. Aortic valve mean gradient measures 40.0 mmHg. Aortic valve peak gradient measures 68.2 mmHg. Aortic valve area, by VTI measures 0.96 cm. Pulmonic Valve: The pulmonic valve was grossly normal. Pulmonic valve regurgitation is not visualized. Aorta: The aortic root is normal in size and structure. IAS/Shunts: No atrial level shunt detected by color flow Doppler. Additional Comments: Spectral Doppler performed. LEFT VENTRICLE PLAX 2D LVOT diam:     2.00 cm LV SV:  80 LV SV Index:   35 LVOT Area:     3.14 cm  AORTIC VALVE AV Area (Vmax):    0.99 cm AV Area (Vmean):   0.95 cm AV Area (VTI):     0.96 cm AV Vmax:           413.00 cm/s AV Vmean:          295.000 cm/s AV VTI:            0.838 m AV Peak Grad:      68.2 mmHg AV Mean Grad:      40.0 mmHg LVOT Vmax:         130.00 cm/s LVOT Vmean:        89.600 cm/s LVOT VTI:          0.255 m LVOT/AV VTI ratio: 0.30 MITRAL VALVE             TRICUSPID VALVE MV Area VTI:  1.30 cm   TR Peak grad:   76.0 mmHg MV Peak grad: 8.0 mmHg   TR Vmax:        436.00 cm/s MV Mean grad: 4.0 mmHg MV Vmax:      1.41 m/s   SHUNTS MV Vmean:     97.5 cm/s  Systemic VTI:  0.26 m                          Systemic Diam: 2.00 cm Epifanio Lesches MD Electronically signed by Epifanio Lesches MD Signature Date/Time:  03/31/2023/5:06:03 PM    Final    DG Abd Portable 1V  Result Date: 03/31/2023 CLINICAL DATA:  Nasogastric tube placement. EXAM: PORTABLE ABDOMEN - 1 VIEW COMPARISON:  March 29, 2023. FINDINGS: Distal tip of nasogastric tube is seen in expected position of distal stomach. IMPRESSION: Distal tip of nasogastric tube is seen in expected position of distal stomach. Electronically Signed   By: Lupita Raider M.D.   On: 03/31/2023 11:58   DG CHEST PORT 1 VIEW  Result Date: 03/31/2023 CLINICAL DATA:  Ventilator support. EXAM: PORTABLE CHEST 1 VIEW COMPARISON:  03/30/2023 FINDINGS: Endotracheal tube tip 5 cm above the carina. Orogastric or nasogastric tube enters the stomach. Right internal jugular central line tip in the SVC at the as kiss level. Previous mitral valve replacement. Relatively low lung volumes. No pulmonary edema. Chronic calcified pleural plaques on the right. Right pleural scarring. Minimal volume loss in both lower lobes. IMPRESSION: 1. Lines and tubes in good position. 2. Low lung volumes. Minimal volume loss in both lower lobes. 3. Chronic calcified pleural plaques on the right. Electronically Signed   By: Paulina Fusi M.D.   On: 03/31/2023 07:26    Cardiac Studies     Patient Profile     57 y.o. 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  Assessment & Plan   Shock: Lactate 12 on admission.  Significantly elevated procalcitonin.  Blood cultures NGTD. Cardiogenic versus septic.  Currently on low dose Levophed and vasopressin.  Echocardiogram 9/21 showed EF 60 to 65%, grade 1 diastolic dysfunction, moderate RV dysfunction, severe RV enlargement, severe pulmonary hypertension (RVSP 73), status post mitral valve repair, mean gradient 4 mmHg across mitral valve, severe aortic stenosis (mean gradient 33, V-max 4.0 m/s, AVA 0.5 cm, DI 0.23), could  not rule out AV vegetation.  TEE 9/23 showed no  vegetation, normal LV function, severe RV dysfunction, severe aortic stenosis, severe pulmonary hypertension -Degree of pulmonary hypertension suggests chronic component of RV failure.  CTPA planned to rule out PE and also evaluate for source of infection -Blood cultures no growth to date.  Abx per primary team -He is preload dependent given severe AS and RV failure.  Currently pulling fluid on CRRT, will need close monitoring given preload dependence.  Plan for RHC with Dr Gala Romney today.  Discussed risks and benefits with patient's sister, who consented for procedure  Aortic stenosis: Severe AS on echo, will need evaluation for TAVR once recovers from acute illness.    VT: Required cardioversion for VT on 9/21.  Suspected due to hyperkalemia (K 6.9) and prolonged QTc (612).  Now on CRRT, potassium improved.  QTc 596 on EKG yesterday.  Calcium low, received repletion.  Repeat ECG today shows improvement with Qtc 560  AHRF: remains intubated.  Per PCCM   CRITICAL CARE TIME: I have spent a total of 35 minutes with patient reviewing hospital notes, telemetry, EKGs, labs and examining the patient as well as establishing an assessment and plan.    Discussed with patient's sister and Dr Denese Killings. > 50% of time was spent in direct patient care. The patient is critically ill with multi-organ system failure and requires high complexity decision making for assessment and support, frequent evaluation and titration of therapies, application of advanced monitoring technologies and extensive interpretation of multiple databases.   For questions or updates, please contact [facility] Please consult [email or web address] for contact info under        Signed, Little Ishikawa, MD  04/01/2023, 8:07 AM

## 2023-04-01 NOTE — Progress Notes (Signed)
[name] NEPHROLOGY PROGRESS NOTE  Assessment/ Plan: Pt is a 57 y.o. yo male   OP HD: [facility] (on hd since 2005)  4.5hr  450/1.5    119kg  2/2 bath  AVF LUA   Heparin 5000 - last OP HD 9/19, post wt 118.4kg  - rocaltrol 0.75 mcg po tts - sensipar 150 mg po tts - no esa (last Hb 11.6 on 9/19)  # Shock septic vs cardiogenic - on IV abx and pressor support per PCCM.  # Severe AS / RV dysfunction/V tach: Status post TEE on 9/23 with no vegetation however so severe AAS, severe RV systolic dysfunction with elevated pulmonary pressure.  Plan for RHC today by cardiology.  # ESRD - HD TTS. Had not missed HD. CRRT started 9/21.  Hyperkalemia improved therefore changing potassium bath today.  Judicious UF because of right ventricular failure.  # Hyperkalemia: Improved therefore potassium bath changed to 4K and pre and post filter.  Monitor lab.  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  Resume binders when he starts eating.  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  No vegetation and TEE.  Subjective: Seen and examined in ICU.  Currently on Levophed 5 mics and vasopressin 0.04.  Plan for RHC today.  No issue with CRRT, tolerating around 50 cc UF.  Discussed with ICU nurse. Objective Vital signs in last 24 hours: Vitals:   04/01/23 0830 04/01/23 0900 04/01/23 0930 04/01/23 1000  BP:      Pulse: 61 63 62 61  Resp: 20 (!) 21 20 20   Temp:      TempSrc:      SpO2: 100% 98% 99% 99%  Weight:      Height:       Weight change: 0 kg  Intake/Output Summary (Last 24 hours) at 04/01/2023 1012 Last data filed at 04/01/2023 0800 Gross per 24 hour  Intake 2012.67 ml  Output 2892.5 ml  Net -879.83 ml       Labs: RENAL PANEL Recent Labs  Lab 03/29/23 1912 03/29/23 2120 03/30/23 0134 03/30/23 0312 03/30/23 0359 03/30/23 1250 03/30/23 1252 03/30/23 1600 03/31/23 0224 03/31/23 1604  04/01/23 0417  NA 136   < >  --  134*   < > 134* 135 135 134* 134* 131*  K 6.9*   < >  --  6.0*   < > 5.1 5.1 5.0 4.0 3.8 3.5  CL 89*  --   --  94*  --  94*  --  99 97* 98 97*  CO2 14*  --   --  19*  --  20*  --  21* 19* 19* 19*  GLUCOSE 67*  --   --  133*  --  126*  --  126* 141* 155* 221*  BUN 79*  --   --  63*  --  51*  --  44* 39* 35* 35*  CREATININE 10.23*  --   --  7.69*  --  5.71*  --  5.41* 4.31* 3.34* 2.80*  CALCIUM 7.9*  --   --  6.6*  --  6.7*  --  6.5* 6.8* 7.1* 7.1*  MG 2.9*  --  2.5*  --   --   --   --   --  2.4 2.4 2.6*  PHOS  --   --   --  7.0*  --   --   --  5.8* 5.1* 5.0*  4.9* 3.7  ALBUMIN  --   --   --  2.1*  --   --   --  1.9* 1.8* 1.8* 1.8*  1.8*   < > = values in this interval not displayed.    Liver Function Tests: Recent Labs  Lab 03/29/23 1013 03/30/23 0312 03/31/23 0224 03/31/23 1604 04/01/23 0417  AST 445*  --   --   --  1,061*  ALT 96*  --   --   --  550*  ALKPHOS 165*  --   --   --  155*  BILITOT 8.6*  --   --   --  9.1*  PROT 7.6  --   --   --  7.2  ALBUMIN 2.2*   < > 1.8* 1.8* 1.8*  1.8*   < > = values in this interval not displayed.   Recent Labs  Lab 03/29/23 1158  LIPASE 41  AMYLASE 71   No results for input(s): "AMMONIA" in the last 168 hours. CBC: Recent Labs    03/30/23 0134 03/30/23 0359 03/30/23 1252 03/31/23 0224 04/01/23 0417  HGB 11.6* 12.6* 11.9* 10.8* 10.5*  MCV 81.8  --   --  82.6 82.4    Cardiac Enzymes: No results for input(s): "CKTOTAL", "CKMB", "CKMBINDEX", "TROPONINI" in the last 168 hours. CBG: Recent Labs  Lab 03/31/23 1549 03/31/23 1924 03/31/23 2309 04/01/23 0421 04/01/23 0735  GLUCAP 147* 178* 187* 215* 203*    Iron Studies: No results for input(s): "IRON", "TIBC", "TRANSFERRIN", "FERRITIN" in the last 72 hours. Studies/Results: ECHO TEE  Result Date: 03/31/2023    TRANSESOPHOGEAL ECHO REPORT   Patient Name:   Timothy Palmer [facility] Date of Exam: 03/31/2023 Medical Rec #:  [id number]      Height:        71.0 in Accession #:    [id number]     Weight:       239.2 lb Date of Birth:  10-28-1965     BSA:          2.275 m Patient Age:    56 years       BP:           125/49 mmHg Patient Gender: M              HR:           58 bpm. Exam Location:  Inpatient Procedure: Transesophageal Echo, Color Doppler, Cardiac Doppler and 3D Echo Indications:     Endocarditis  History:         Patient has prior history of Echocardiogram examinations, most                  recent 03/29/2023. Right Heart Failure, Prior Cardiac Surgery,                  ESRD, Aortic Valve Disease, Mitral Valve Disease, Endocarditis,                  Prosthetic Valve Complications and Minimally Invasive MV                  repair; Risk Factors:Hypotension.                   Mitral Valve: prosthetic annuloplasty ring valve is present in                  the mitral position.  Sonographer:     [facility] Referring Phys:  [id number] Tanna Savoy [facility] Diagnosing Phys: Epifanio Lesches MD  Sonographer Comments: TEE probe [id number] used for procedure. PROCEDURE: After discussion of the risks and benefits of a TEE, an informed consent was obtained from a family member. The transesophogeal probe was passed without difficulty through the esophogus of the patient. Imaged were obtained with the patient in a supine position. Sedation performed by different physician. Patients was under conscious sedation during this procedure. Anesthetic administered: of Fentanyl, 4.0mg  of Versed. Image quality was good. The patient's vital signs; including heart rate, blood pressure, and oxygen saturation; remained stable throughout the procedure. The patient developed no complications during the procedure.  IMPRESSIONS  1. Left ventricular ejection fraction, by estimation, is 55 to 60%. The left ventricle has normal function.  2. Right ventricular systolic function is severely reduced. The right ventricular size is severely enlarged. There is severely elevated pulmonary artery  systolic pressure.  3. No left atrial/left atrial appendage thrombus was detected.  4. Right atrial size was moderately dilated.  5. The mitral valve has been repaired/replaced. Mild mitral valve regurgitation. The mean mitral valve gradient is 4.0 mmHg. There is a prosthetic annuloplasty ring present in the mitral position.  6. Tricuspid valve regurgitation is mild to moderate.  7. The aortic valve is tricuspid. There is severe calcifcation of the aortic valve. Aortic valve regurgitation is not visualized. Severe aortic valve stenosis. Aortic valve mean gradient measures 40.0 mmHg. Aortic valve Vmax measures 4.13 m/s.  8. No vegetation seen FINDINGS  Left Ventricle: Left ventricular ejection fraction, by estimation, is 55 to 60%. The left ventricle has normal function. The left ventricular internal cavity size was normal in size. Right Ventricle: The right ventricular size is severely enlarged. Right vetricular wall thickness was not well visualized. Right ventricular systolic function is severely reduced. There is severely elevated pulmonary artery systolic pressure. The tricuspid regurgitant velocity is 4.36 m/s, and with an assumed right atrial pressure of 8 mmHg, the estimated right ventricular systolic pressure is 84.0 mmHg. Left Atrium: Left atrial size was normal in size. No left atrial/left atrial appendage thrombus was detected. Right Atrium: Right atrial size was moderately dilated. Pericardium: There is no evidence of pericardial effusion. Mitral Valve: The mitral valve has been repaired/replaced. Mild mitral valve regurgitation. There is a prosthetic annuloplasty ring present in the mitral position. MV peak gradient, 8.0 mmHg. The mean mitral valve gradient is 4.0 mmHg with average heart rate of 59 bpm. Tricuspid Valve: The tricuspid valve is normal in structure. Tricuspid valve regurgitation is mild to moderate. Aortic Valve: The aortic valve is tricuspid. There is severe calcifcation of the aortic  valve. Aortic valve regurgitation is not visualized. Severe aortic stenosis is present. Aortic valve mean gradient measures 40.0 mmHg. Aortic valve peak gradient measures 68.2 mmHg. Aortic valve area, by VTI measures 0.96 cm. Pulmonic Valve: The pulmonic valve was grossly normal. Pulmonic valve regurgitation is not visualized. Aorta: The aortic root is normal in size and structure. IAS/Shunts: No atrial level shunt detected by color flow Doppler. Additional Comments: Spectral Doppler performed. LEFT VENTRICLE PLAX 2D LVOT diam:     2.00 cm LV SV:         80 LV SV Index:   35 LVOT Area:     3.14 cm  AORTIC VALVE AV Area (Vmax):    0.99 cm AV Area (Vmean):   0.95 cm AV Area (VTI):     0.96 cm AV Vmax:  413.00 cm/s AV Vmean:          295.000 cm/s AV VTI:            0.838 m AV Peak Grad:      68.2 mmHg AV Mean Grad:      40.0 mmHg LVOT Vmax:         130.00 cm/s LVOT Vmean:        89.600 cm/s LVOT VTI:          0.255 m LVOT/AV VTI ratio: 0.30 MITRAL VALVE             TRICUSPID VALVE MV Area VTI:  1.30 cm   TR Peak grad:   76.0 mmHg MV Peak grad: 8.0 mmHg   TR Vmax:        436.00 cm/s MV Mean grad: 4.0 mmHg MV Vmax:      1.41 m/s   SHUNTS MV Vmean:     97.5 cm/s  Systemic VTI:  0.26 m                          Systemic Diam: 2.00 cm Epifanio Lesches MD Electronically signed by Epifanio Lesches MD Signature Date/Time: 03/31/2023/5:06:03 PM    Final    DG Abd Portable 1V  Result Date: 03/31/2023 CLINICAL DATA:  Nasogastric tube placement. EXAM: PORTABLE ABDOMEN - 1 VIEW COMPARISON:  March 29, 2023. FINDINGS: Distal tip of nasogastric tube is seen in expected position of distal stomach. IMPRESSION: Distal tip of nasogastric tube is seen in expected position of distal stomach. Electronically Signed   By: Lupita Raider M.D.   On: 03/31/2023 11:58   DG CHEST PORT 1 VIEW  Result Date: 03/31/2023 CLINICAL DATA:  Ventilator support. EXAM: PORTABLE CHEST 1 VIEW COMPARISON:  03/30/2023 FINDINGS:  Endotracheal tube tip 5 cm above the carina. Orogastric or nasogastric tube enters the stomach. Right internal jugular central line tip in the SVC at the as kiss level. Previous mitral valve replacement. Relatively low lung volumes. No pulmonary edema. Chronic calcified pleural plaques on the right. Right pleural scarring. Minimal volume loss in both lower lobes. IMPRESSION: 1. Lines and tubes in good position. 2. Low lung volumes. Minimal volume loss in both lower lobes. 3. Chronic calcified pleural plaques on the right. Electronically Signed   By: Paulina Fusi M.D.   On: 03/31/2023 07:26    Medications: Infusions:  ceFEPime (MAXIPIME) IV 2 g (04/01/23 0800)   feeding supplement (PIVOT 1.5 CAL) Stopped (04/01/23 0830)   fentaNYL infusion INTRAVENOUS 125 mcg/hr (04/01/23 0800)   heparin 1,900 Units/hr (04/01/23 0805)   norepinephrine (LEVOPHED) Adult infusion 5 mcg/min (04/01/23 0800)   prismasol BGK 2/2.5 dialysis solution 1,500 mL/hr at 04/01/23 0800   prismasol BGK 2/2.5 replacement solution 400 mL/hr at 04/01/23 0033   prismasol BGK 2/2.5 replacement solution 400 mL/hr at 04/01/23 0031   vasopressin 0.04 Units/min (04/01/23 0800)    Scheduled Medications:  calcitRIOL  0.75 mcg Per Tube Q T,Th,Sat-1800   Chlorhexidine Gluconate Cloth  6 each Topical Daily   docusate  100 mg Per Tube BID   feeding supplement (PROSource TF20)  60 mL Per Tube BID   hydrocortisone sod succinate (SOLU-CORTEF) inj  100 mg Intravenous Q12H   insulin aspart  0-15 Units Subcutaneous Q4H   mouth rinse  15 mL Mouth Rinse Q2H   pantoprazole (PROTONIX) IV  40 mg Intravenous Daily   polyethylene glycol  17 g Per Tube Daily  have reviewed scheduled and prn medications.  Physical Exam: General: Ill looking male, intubated, sedated. Heart:RRR, s1s2 nl Lungs: Coarse breath sound bilateral. Abdomen:soft, Non-tender, non-distended Extremities: Trace peripheral edema Dialysis Access: L AVF.  RIJ temp hD cath placed  on 9/21.   Peaches Vanoverbeke Prasad Facundo Allemand 04/01/2023,10:12 AM  LOS: 3 days

## 2023-04-01 NOTE — Interval H&P Note (Signed)
History and Physical Interval Note:  04/01/2023 5:02 PM  Timothy Palmer  has presented today for surgery, with the diagnosis of heart failure.  The various methods of treatment have been discussed with the patient and family. After consideration of risks, benefits and other options for treatment, the patient has consented to  Procedure(s): RIGHT HEART CATH (N/A) as a surgical intervention.  The patient's history has been reviewed, patient examined, no change in status, stable for surgery.  I have reviewed the patient's chart and labs.  Questions were answered to the patient's satisfaction.     [name]

## 2023-04-01 NOTE — Progress Notes (Signed)
ANTICOAGULATION CONSULT NOTE  Pharmacy Consult for Heparin Indication: ACS vs VTE  No Known Allergies  Patient Measurements: Height: 5\' 11"  (180.3 cm) Weight: 108.5 kg (239 lb 3.2 oz) IBW/kg (Calculated) : 75.3 Heparin Dosing Weight: 100 kg  Vital Signs: Temp: 100.2 F (37.9 C) (09/24 1520) Temp Source: Axillary (09/24 1520) Pulse Rate: 65 (09/24 1601)  Labs: Recent Labs    03/29/23 2352 03/30/23 0134 03/30/23 0205 03/30/23 0555 03/30/23 1200 03/30/23 1252 03/30/23 1600 03/31/23 0224 03/31/23 1604 04/01/23 0417 04/01/23 1542  HGB  --  11.6*   < >  --   --  11.9*  --  10.8*  --  10.5*  --   HCT  --  32.7*   < >  --   --  35.0*  --  30.4*  --  30.8*  --   PLT  --  233  --   --   --   --   --  167  --  156  --   APTT  --   --    < >  --    < >  --   --  75*  --  57* 82*  HEPARINUNFRC  --   --    < >  --    < >  --   --  0.21*  --  0.17* 0.28*  CREATININE  --   --    < >  --    < >  --    < > 4.31* 3.34* 2.80*  --   TROPONINIHS 1,293* 1,254*  --  1,601*  --   --   --   --   --   --   --    < > = values in this interval not displayed.    Estimated Creatinine Clearance: 37.4 mL/min (A) (by C-G formula based on SCr of 2.8 mg/dL (H)).   Assessment: 55 YOM presenting with shock. Significant cardiac history including MSSA endocarditis, mitral regurgitation, aortic stenosis. Also hx of ESRD and DVT. Presented with elevated troponins and ECHO showed moderate RV dysfunction and RV enlargement. Pharmacy consulted to dose heparin to rule out PE with potential ACS. No AC PTA  TBilirubin 8.6, aPTT therapeutic at 82. CBC stable.Received additional 2400 units in cath ~16:00. RN confirmed, no issues with bleeding and no interruption with heparin infusion.   Goal of Therapy:  Heparin level 0.3-0.7 units/ml aPTT 66-102 seconds Monitor platelets by anticoagulation protocol: Yes   Plan:  Continue heparin infusion at 1900 units/hr Continue to use aPTT to guide dosing w/ elevated  Tbili, f/u Tbili levels Check heparin level in 6 hours and daily while on heparin Continue to monitor H&H and platelets   Thank you for allowing pharmacy to be a part of this patient's care.  Thelma Barge, PharmD Clinical Pharmacist

## 2023-04-01 NOTE — Progress Notes (Addendum)
ANTICOAGULATION CONSULT NOTE  Pharmacy Consult for Heparin Indication: ACS vs VTE  No Known Allergies  Patient Measurements: Height: 5\' 11"  (180.3 cm) Weight: 108.5 kg (239 lb 3.2 oz) IBW/kg (Calculated) : 75.3 Heparin Dosing Weight: 100 kg  Vital Signs: Temp: 98.7 F (37.1 C) (09/24 0400) Temp Source: Axillary (09/24 0400) Pulse Rate: 62 (09/24 0600)  Labs: Recent Labs    03/29/23 1158 03/29/23 1350 03/29/23 2352 03/30/23 0134 03/30/23 0205 03/30/23 0555 03/30/23 1200 03/30/23 1250 03/30/23 1252 03/30/23 1600 03/31/23 0224 03/31/23 1604 04/01/23 0417  HGB 11.6*   < >  --  11.6*   < >  --   --   --  11.9*  --  10.8*  --  10.5*  HCT 34.9*   < >  --  32.7*   < >  --   --   --  35.0*  --  30.4*  --  30.8*  PLT 224   < >  --  233  --   --   --   --   --   --  167  --  156  APTT  --    < >  --   --    < >  --  65*  --   --   --  75*  --  57*  LABPROT 20.8*  --   --   --   --   --   --   --   --   --   --   --   --   INR 1.8*  --   --   --   --   --   --   --   --   --   --   --   --   HEPARINUNFRC  --    < >  --   --    < >  --  0.16*  --   --   --  0.21*  --  0.17*  CREATININE 10.11*   < >  --   --    < >  --   --    < >  --  5.41* 4.31* 3.34*  --   TROPONINIHS  --    < > 1,293* 1,254*  --  1,601*  --   --   --   --   --   --   --    < > = values in this interval not displayed.    Estimated Creatinine Clearance: 31.3 mL/min (A) (by C-G formula based on SCr of 3.34 mg/dL (H)).   Assessment: 16 YOM presenting with shock. Significant cardiac history including MSSA endocarditis, mitral regurgitation, aortic stenosis. Also hx of ESRD and DVT. Presented with elevated troponins and ECHO showed moderate RV dysfunction and RV enlargement. Pharmacy consulted to dose heparin to rule out PE with potential ACS. No AC PTA  TBilirubin 8.6, HL 0.17, APTT subtherapeutic at 57. CBC stable, no new bleeding noted. CT angio pending  Goal of Therapy:  Heparin level 0.3-0.7  units/ml aPTT 66-102 seconds Monitor platelets by anticoagulation protocol: Yes   Plan:  Continue to use aPTT to guide dosing w/ elevated Tbili Heparin bolus 1500 units x 1 Then increase heparin to 1900 units/hr  6 hour heparin level and aPTT Continue to monitor for s/sx of bleeding  F/u repeat Tbili levels   Thank you for involving pharmacy in the patient's care.   Theotis Burrow, PharmD PGY1 Acute Care  Pharmacy Resident  04/01/2023 6:14 AM

## 2023-04-01 NOTE — H&P (View-Only) (Signed)
Rounding Note    Patient Name: Timothy Palmer Date of Encounter: 04/01/2023   [facility] Cardiologist: Little Ishikawa, MD   Subjective   Net negative 1L yesterday on CRRT. Intubated, sedated, not following commands.  On levophed at 5 mcg/min and vasopressin.  FiO2 50%, PEEP 5.  Inpatient Medications    Scheduled Meds:  calcitRIOL  0.75 mcg Per Tube Q T,Th,Sat-1800   Chlorhexidine Gluconate Cloth  6 each Topical Daily   docusate  100 mg Per Tube BID   feeding supplement (PROSource TF20)  60 mL Per Tube BID   hydrocortisone sod succinate (SOLU-CORTEF) inj  100 mg Intravenous Q12H   insulin aspart  0-15 Units Subcutaneous Q4H   mouth rinse  15 mL Mouth Rinse Q2H   pantoprazole (PROTONIX) IV  40 mg Intravenous Daily   polyethylene glycol  17 g Per Tube Daily   Continuous Infusions:  ceFEPime (MAXIPIME) IV Stopped (03/31/23 2033)   feeding supplement (PIVOT 1.5 CAL) 30 mL/hr at 04/01/23 0700   fentaNYL infusion INTRAVENOUS 125 mcg/hr (04/01/23 0700)   heparin 1,700 Units/hr (04/01/23 0700)   norepinephrine (LEVOPHED) Adult infusion 4 mcg/min (04/01/23 0700)   prismasol BGK 2/2.5 dialysis solution 1,500 mL/hr at 04/01/23 0415   prismasol BGK 2/2.5 replacement solution 400 mL/hr at 04/01/23 0033   prismasol BGK 2/2.5 replacement solution 400 mL/hr at 04/01/23 0031   vancomycin Stopped (04/01/23 0010)   vasopressin 0.04 Units/min (04/01/23 0700)   PRN Meds: docusate, fentaNYL, Gerhardt's butt cream, heparin, lip balm, midazolam, mouth rinse, polyethylene glycol   Vital Signs    Vitals:   04/01/23 0645 04/01/23 0700 04/01/23 0732 04/01/23 0800  BP:      Pulse: 61 61 60 64  Resp: 20 20 20 15   Temp:   100 F (37.8 C)   TempSrc:   Axillary   SpO2: 100% 100% 100% 99%  Weight:      Height:        Intake/Output Summary (Last 24 hours) at 04/01/2023 0807 Last data filed at 04/01/2023 0700 Gross per 24 hour  Intake 2153.36 ml  Output 3127.4 ml  Net  -974.04 ml      04/01/2023    1:28 AM 03/31/2023    2:22 AM 03/30/2023    3:27 AM  Last 3 Weights  Weight (lbs) 239 lb 3.2 oz 239 lb 3.2 oz 246 lb 4.1 oz  Weight (kg) 108.5 kg 108.5 kg 111.7 kg      Telemetry    NSR, brief NSVT, PACs/PVCs - Personally Reviewed  ECG    Sinus, incomplete right bundle branch block, right axis deviation, diffuse T wave inversions, QTc 560- Personally Reviewed  Physical Exam   GEN: intubated, sedated  Neck: No JVD appreciated Cardiac: RRR, 3/6 systolic murmur Respiratory: Clear to auscultation bilaterally in anterior fields GI: Soft, nontender, non-distended  MS: No edema Neuro:  No following commands Psych: Unable to assess  Labs    High Sensitivity Troponin:   Recent Labs  Lab 03/29/23 1912 03/29/23 2214 03/29/23 2352 03/30/23 0134 03/30/23 0555  TROPONINIHS 1,425* 1,143* 1,293* 1,254* 1,601*     Chemistry Recent Labs  Lab 03/29/23 1013 03/29/23 1118 03/31/23 0224 03/31/23 1604 04/01/23 0417  NA 135   < > 134* 134* 131*  K 5.9*   < > 4.0 3.8 3.5  CL 89*   < > 97* 98 97*  CO2 17*   < > 19* 19* 19*  GLUCOSE 64*   < > 141*  155* 221*  BUN 70*   < > 39* 35* 35*  CREATININE 10.07*   < > 4.31* 3.34* 2.80*  CALCIUM 7.0*   < > 6.8* 7.1* 7.1*  MG 2.6*   < > 2.4 2.4 2.6*  PROT 7.6  --   --   --   --   ALBUMIN 2.2*   < > 1.8* 1.8* 1.8*  AST 445*  --   --   --   --   ALT 96*  --   --   --   --   ALKPHOS 165*  --   --   --   --   BILITOT 8.6*  --   --   --   --   GFRNONAA 6*   < > 15* 21* 26*  ANIONGAP 29*   < > 18* 17* 15   < > = values in this interval not displayed.    Lipids No results for input(s): "CHOL", "TRIG", "HDL", "LABVLDL", "LDLCALC", "CHOLHDL" in the last 168 hours.  Hematology Recent Labs  Lab 03/30/23 0134 03/30/23 0359 03/30/23 1252 03/31/23 0224 04/01/23 0417  WBC 22.2*  --   --  19.6* 18.6*  RBC 4.00*  --   --  3.68* 3.74*  HGB 11.6*   < > 11.9* 10.8* 10.5*  HCT 32.7*   < > 35.0* 30.4* 30.8*  MCV 81.8   --   --  82.6 82.4  MCH 29.0  --   --  29.3 28.1  MCHC 35.5  --   --  35.5 34.1  RDW 16.6*  --   --  15.9* 16.5*  PLT 233  --   --  167 156   < > = values in this interval not displayed.   Thyroid No results for input(s): "TSH", "FREET4" in the last 168 hours.  BNP Recent Labs  Lab 03/29/23 1158  BNP 2,670.6*    DDimer No results for input(s): "DDIMER" in the last 168 hours.   Radiology    ECHO TEE  Result Date: 03/31/2023    TRANSESOPHOGEAL ECHO REPORT   Patient Name:   Timothy Palmer Date of Exam: 03/31/2023 Medical Rec #:  [id number]      Height:       71.0 in Accession #:    [id number]     Weight:       239.2 lb Date of Birth:  1966/05/03     BSA:          2.275 m Patient Age:    56 years       BP:           125/49 mmHg Patient Gender: M              HR:           58 bpm. Exam Location:  Inpatient Procedure: Transesophageal Echo, Color Doppler, Cardiac Doppler and 3D Echo Indications:     Endocarditis  History:         Patient has prior history of Echocardiogram examinations, most                  recent 03/29/2023. Right Heart Failure, Prior Cardiac Surgery,                  ESRD, Aortic Valve Disease, Mitral Valve Disease, Endocarditis,                  Prosthetic Valve Complications and Minimally Invasive MV  repair; Risk Factors:Hypotension.                   Mitral Valve: prosthetic annuloplasty ring valve is present in                  the mitral position.  Sonographer:     [facility] Referring Phys:  [id number] Little Ishikawa Diagnosing Phys: Epifanio Lesches MD  Sonographer Comments: TEE probe [phone] used for procedure. PROCEDURE: After discussion of the risks and benefits of a TEE, an informed consent was obtained from a family member. The transesophogeal probe was passed without difficulty through the esophogus of the patient. Imaged were obtained with the patient in a supine position. Sedation performed by different physician. Patients was under conscious sedation  during this procedure. Anesthetic administered: of Fentanyl, 4.0mg  of Versed. Image quality was good. The patient's vital signs; including heart rate, blood pressure, and oxygen saturation; remained stable throughout the procedure. The patient developed no complications during the procedure.  IMPRESSIONS  1. Left ventricular ejection fraction, by estimation, is 55 to 60%. The left ventricle has normal function.  2. Right ventricular systolic function is severely reduced. The right ventricular size is severely enlarged. There is severely elevated pulmonary artery systolic pressure.  3. No left atrial/left atrial appendage thrombus was detected.  4. Right atrial size was moderately dilated.  5. The mitral valve has been repaired/replaced. Mild mitral valve regurgitation. The mean mitral valve gradient is 4.0 mmHg. There is a prosthetic annuloplasty ring present in the mitral position.  6. Tricuspid valve regurgitation is mild to moderate.  7. The aortic valve is tricuspid. There is severe calcifcation of the aortic valve. Aortic valve regurgitation is not visualized. Severe aortic valve stenosis. Aortic valve mean gradient measures 40.0 mmHg. Aortic valve Vmax measures 4.13 m/s.  8. No vegetation seen FINDINGS  Left Ventricle: Left ventricular ejection fraction, by estimation, is 55 to 60%. The left ventricle has normal function. The left ventricular internal cavity size was normal in size. Right Ventricle: The right ventricular size is severely enlarged. Right vetricular wall thickness was not well visualized. Right ventricular systolic function is severely reduced. There is severely elevated pulmonary artery systolic pressure. The tricuspid regurgitant velocity is 4.36 m/s, and with an assumed right atrial pressure of 8 mmHg, the estimated right ventricular systolic pressure is 84.0 mmHg. Left Atrium: Left atrial size was normal in size. No left atrial/left atrial appendage thrombus was detected. Right  Atrium: Right atrial size was moderately dilated. Pericardium: There is no evidence of pericardial effusion. Mitral Valve: The mitral valve has been repaired/replaced. Mild mitral valve regurgitation. There is a prosthetic annuloplasty ring present in the mitral position. MV peak gradient, 8.0 mmHg. The mean mitral valve gradient is 4.0 mmHg with average heart rate of 59 bpm. Tricuspid Valve: The tricuspid valve is normal in structure. Tricuspid valve regurgitation is mild to moderate. Aortic Valve: The aortic valve is tricuspid. There is severe calcifcation of the aortic valve. Aortic valve regurgitation is not visualized. Severe aortic stenosis is present. Aortic valve mean gradient measures 40.0 mmHg. Aortic valve peak gradient measures 68.2 mmHg. Aortic valve area, by VTI measures 0.96 cm. Pulmonic Valve: The pulmonic valve was grossly normal. Pulmonic valve regurgitation is not visualized. Aorta: The aortic root is normal in size and structure. IAS/Shunts: No atrial level shunt detected by color flow Doppler. Additional Comments: Spectral Doppler performed. LEFT VENTRICLE PLAX 2D LVOT diam:     2.00 cm LV SV:  80 LV SV Index:   35 LVOT Area:     3.14 cm  AORTIC VALVE AV Area (Vmax):    0.99 cm AV Area (Vmean):   0.95 cm AV Area (VTI):     0.96 cm AV Vmax:           413.00 cm/s AV Vmean:          295.000 cm/s AV VTI:            0.838 m AV Peak Grad:      68.2 mmHg AV Mean Grad:      40.0 mmHg LVOT Vmax:         130.00 cm/s LVOT Vmean:        89.600 cm/s LVOT VTI:          0.255 m LVOT/AV VTI ratio: 0.30 MITRAL VALVE             TRICUSPID VALVE MV Area VTI:  1.30 cm   TR Peak grad:   76.0 mmHg MV Peak grad: 8.0 mmHg   TR Vmax:        436.00 cm/s MV Mean grad: 4.0 mmHg MV Vmax:      1.41 m/s   SHUNTS MV Vmean:     97.5 cm/s  Systemic VTI:  0.26 m                          Systemic Diam: 2.00 cm Epifanio Lesches MD Electronically signed by Epifanio Lesches MD Signature Date/Time:  03/31/2023/5:06:03 PM    Final    DG Abd Portable 1V  Result Date: 03/31/2023 CLINICAL DATA:  Nasogastric tube placement. EXAM: PORTABLE ABDOMEN - 1 VIEW COMPARISON:  March 29, 2023. FINDINGS: Distal tip of nasogastric tube is seen in expected position of distal stomach. IMPRESSION: Distal tip of nasogastric tube is seen in expected position of distal stomach. Electronically Signed   By: Lupita Raider M.D.   On: 03/31/2023 11:58   DG CHEST PORT 1 VIEW  Result Date: 03/31/2023 CLINICAL DATA:  Ventilator support. EXAM: PORTABLE CHEST 1 VIEW COMPARISON:  03/30/2023 FINDINGS: Endotracheal tube tip 5 cm above the carina. Orogastric or nasogastric tube enters the stomach. Right internal jugular central line tip in the SVC at the as kiss level. Previous mitral valve replacement. Relatively low lung volumes. No pulmonary edema. Chronic calcified pleural plaques on the right. Right pleural scarring. Minimal volume loss in both lower lobes. IMPRESSION: 1. Lines and tubes in good position. 2. Low lung volumes. Minimal volume loss in both lower lobes. 3. Chronic calcified pleural plaques on the right. Electronically Signed   By: Paulina Fusi M.D.   On: 03/31/2023 07:26    Cardiac Studies     Patient Profile     57 y.o. 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  Assessment & Plan   Shock: Lactate 12 on admission.  Significantly elevated procalcitonin.  Blood cultures NGTD. Cardiogenic versus septic.  Currently on low dose Levophed and vasopressin.  Echocardiogram 9/21 showed EF 60 to 65%, grade 1 diastolic dysfunction, moderate RV dysfunction, severe RV enlargement, severe pulmonary hypertension (RVSP 73), status post mitral valve repair, mean gradient 4 mmHg across mitral valve, severe aortic stenosis (mean gradient 33, V-max 4.0 m/s, AVA 0.5 cm, DI 0.23), could  not rule out AV vegetation.  TEE 9/23 showed no  vegetation, normal LV function, severe RV dysfunction, severe aortic stenosis, severe pulmonary hypertension -Degree of pulmonary hypertension suggests chronic component of RV failure.  CTPA planned to rule out PE and also evaluate for source of infection -Blood cultures no growth to date.  Abx per primary team -He is preload dependent given severe AS and RV failure.  Currently pulling fluid on CRRT, will need close monitoring given preload dependence.  Plan for RHC with Dr Gala Romney today.  Discussed risks and benefits with patient's sister, who consented for procedure  Aortic stenosis: Severe AS on echo, will need evaluation for TAVR once recovers from acute illness.    VT: Required cardioversion for VT on 9/21.  Suspected due to hyperkalemia (K 6.9) and prolonged QTc (612).  Now on CRRT, potassium improved.  QTc 596 on EKG yesterday.  Calcium low, received repletion.  Repeat ECG today shows improvement with Qtc 560  AHRF: remains intubated.  Per PCCM   CRITICAL CARE TIME: I have spent a total of 35 minutes with patient reviewing hospital notes, telemetry, EKGs, labs and examining the patient as well as establishing an assessment and plan.    Discussed with patient's sister and Dr Denese Killings. > 50% of time was spent in direct patient care. The patient is critically ill with multi-organ system failure and requires high complexity decision making for assessment and support, frequent evaluation and titration of therapies, application of advanced monitoring technologies and extensive interpretation of multiple databases.   For questions or updates, please contact [facility] Please consult [email or web address] for contact info under        Signed, Little Ishikawa, MD  04/01/2023, 8:07 AM

## 2023-04-01 NOTE — Progress Notes (Signed)
Pt was transported to cath lab and back to 52m06 without complication.

## 2023-04-01 NOTE — Progress Notes (Signed)
NAME:  Timothy Palmer, MRN:  [id number], DOB:  31-Aug-1965, LOS: 3 ADMISSION DATE:  03/29/2023, CONSULTATION DATE:  03/29/2023 REFERRING MD: EDP, CHIEF COMPLAINT: Shock  History of Present Illness:  57 year old male patient with history of ESRD ( T, Th, Sat) , Systolic and diastolic CHF, HTN, Mitral valve repair ( 2015) and replacement 2/2 MSSA  endocarditis(2016), Mycotic aneurysm, Upper extremity DVT(2015) and PVD. Presented to the Ed 9/21 with history or 4 days of diarrhea which resolved 9/20, with poor po intake and fatigue with weakness and body aches. In the ED he had Lactate of 11.9, Troponin of 900, bedside echo by ED MD showed decreased EF. Last echo 2016 showed EF of 50-55%. He is not followed by cardiology. He is not on blood thinners as they caused his HD graph to bleed. PCCM were notified by ED MD  and asked to admit the patient and manage care .  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   Significant Hospital Events: Including procedures, antibiotic start and stop dates in addition to other pertinent events   03/29/2023 Admission  - Stated he is weak and tired, has no energy.Currently on 2 L [place] with sats of 100%,HD is due today,  9/21> echo with severe RV enlargement and pulmonary hypertension severe arctic stenosis that is calcified.  Nephrology started CRRT due to shock.  Patient went on pressors.  HD cath placed.  Later intubated.  Also had had wide-complex rhythm agonal respirations but appears did not lose any pulses.  Received cardioversion received stat hyperkalemia treatment.  Status post arterial line insertion  Interim History / Subjective:   Overnight events: Sliding scale started.  Patient evaluated bedside this morning.  Patient unable to follow any instructions.  Intubated and sedated.  Not able to participate in any conversation.  Objective   Blood pressure (!) 125/49, pulse 61, temperature 100 F (37.8 C), temperature source Axillary, resp. rate 20, height 5\' 11"  (1.803 m), weight 108.5 kg, SpO2 99%.  Vent Mode: PRVC FiO2 (%):  [30 %-40 %] 30 % Set Rate:  [20 bmp] 20 bmp Vt Set:  [600 mL] 600 mL PEEP:  [5 cmH20] 5 cmH20 Plateau Pressure:  [19 cmH20-24 cmH20] 24 cmH20   Intake/Output Summary (Last 24 hours) at 04/01/2023 1037 Last data filed at 04/01/2023 0800 Gross per 24 hour  Intake 2012.67 ml  Output 2892.5 ml  Net -879.83 ml   Filed Weights   03/30/23 0327 03/31/23 0222 04/01/23 0128  Weight: 111.7 kg 108.5 kg 108.5 kg    Examination: General: In acute distress, intubated and sedated HENT: Normocephalic, atraumatic, ET tube in place Lungs: Clear to auscultation bilaterally Cardiovascular: Regular rate, 3/6 systolic murmur appreciated throughout, no gallops Abdomen: Soft, nontender, nontender, normal active bowel sounds Extremities: Trace edema bilaterally to lower extremities Neuro: Intubated and sedated GU: On CRRT  Resolved Hospital Problem list     Assessment & Plan:   #Undifferentiated shock:  Septic versus cardiogenic, likely combined  #Aortic valve stenosis Patient did have TEE yesterday which did not show any evidence of vegetations.  Blood cultures with no growth so  far.  Cardiology is following.  This could be related to right ventricular dysfunction secondary to aortic stenosis.  Will evaluate with CTA today to evaluate for PE.  Patient is currently on vasopressin and Levophed.  Will try to wean off.  Will try to wake patient up today as well.  Did start pulling off fluid yesterday with CRRT, and anticipate this should help as well.  Continue stress dose steroids as well.  Unclear etiology, but likely this is combined septic and cardiogenic shock.  Unclear source of infection at this time, did obtain CT abdomen pelvis only initially came in which was not revealing.  Blood cultures with no growth so far. -CTA chest PE today -Continue vasopressor support with Levophed and vasopressin, try to wean off as tolerated -MAP goal greater than 65 -Continue hydrocortisone for stress dose steroids, will try to wean down -Will concern for sepsis, will continue with cefepime day 4, will stop vancomycin -Cardiology following, appreciate recommendations -Once patient is more hemodynamically stable, will need evaluation for TAVR  -Will plan for RHC today with Dr. Gala Romney to evaluate if this could be related to high output heart failure from shunt  #Acute hypoxemic respiratory failure Patient still requiring mechanical ventilation.  Will try to wake patient up and try to wean off if tolerated.  Patient did fail SBT yesterday.  Will try another SBT today.  -VAP bundle -Continue cefepime day 4, stop vancomycin -Pulmonary toilet and hygiene -Wean off ventilator and wean sedation  #History of V. tach Patient has been sinus rhythm.  Has not had any further episodes of ventricular tachycardia.  Currently doing well. -Continue telemetry -Plan per cardiology   #Hypocalcemia Hypocalcemia is improving.  QTc at 560 with EKG.  No acute concerns at this time.  Continue CRRT. -Continue to monitor calcium levels  #ESRD Did pull off 3.1 L yesterday.  Patient tolerated well.   Pressures have been able to tolerate it well as well. -Continue CRRT per nephrology -Once filter change today, will likely take patient for CTA chest  #Hyperglycemia Patient was started on sliding scare insulin overnight night. Patient started receiving tube feeds and this is likely why his blood glucose levels started to increase. -Holding tube feeds in the setting of potential right heart cath -Will continue sliding scale insulin  Best Practice (right click and "Reselect all SmartList Selections" daily)   Diet/type: tubefeeds DVT prophylaxis: systemic heparin GI prophylaxis: PPI Lines: Central line Foley:  N/A and Yes, and it is still needed Code Status:  full code  Labs   CBC: Recent Labs  Lab 03/29/23 0800 03/29/23 1118 03/29/23 1158 03/29/23 1912 03/29/23 2120 03/30/23 0134 03/30/23 0359 03/30/23 1252 03/31/23 0224 04/01/23 0417  WBC 22.6*  --  22.1* 23.6*  --  22.2*  --   --  19.6* 18.6*  NEUTROABS 20.0*  --   --   --   --   --   --   --   --   --   HGB 12.2*   < > 11.6* 11.8*   < > 11.6* 12.6* 11.9* 10.8* 10.5*  HCT 38.4*   < > 34.9* 37.6*   < > 32.7* 37.0* 35.0* 30.4* 30.8*  MCV 88.9  --  86.6 90.8  --  81.8  --   --  82.6 82.4  PLT 236  --  224 227  --  233  --   --  167 156   < > = values in this interval not displayed.    Basic Metabolic Panel: Recent Labs  Lab 03/29/23 1912 03/29/23 2120 03/30/23 0134 03/30/23 0312 03/30/23 0359 03/30/23 1250 03/30/23 1252 03/30/23 1600 03/31/23 0224 03/31/23 1604 04/01/23 0417  NA 136   < >  --  134*   < > 134* 135 135 134* 134* 131*  K 6.9*   < >  --  6.0*   < > 5.1 5.1 5.0 4.0 3.8 3.5  CL 89*  --   --  94*  --  94*  --  99 97* 98 97*  CO2 14*  --   --  19*  --  20*  --  21* 19* 19* 19*  GLUCOSE 67*  --   --  133*  --  126*  --  126* 141* 155* 221*  BUN 79*  --   --  63*  --  51*  --  44* 39* 35* 35*  CREATININE 10.23*  --   --  7.69*  --  5.71*  --  5.41* 4.31* 3.34* 2.80*  CALCIUM 7.9*  --   --  6.6*  --   6.7*  --  6.5* 6.8* 7.1* 7.1*  MG 2.9*  --  2.5*  --   --   --   --   --  2.4 2.4 2.6*  PHOS  --   --   --  7.0*  --   --   --  5.8* 5.1* 5.0*  4.9* 3.7   < > = values in this interval not displayed.   GFR: Estimated Creatinine Clearance: 37.4 mL/min (A) (by C-G formula based on SCr of 2.8 mg/dL (H)). Recent Labs  Lab 03/29/23 1158 03/29/23 1655 03/29/23 1912 03/29/23 2114 03/30/23 0134 03/31/23 0224 04/01/23 0417  PROCALCITON 47.41  --   --   --   --   --   --   WBC 22.1*  --  23.6*  --  22.2* 19.6* 18.6*  LATICACIDVEN  --    < > >9.0* 6.7* 4.8* 2.0*  --    < > = values in this interval not displayed.    Liver Function Tests: Recent Labs  Lab 03/29/23 1013 03/30/23 0312 03/30/23 1600 03/31/23 0224 03/31/23 1604 04/01/23 0417  AST 445*  --   --   --   --  1,061*  ALT 96*  --   --   --   --  550*  ALKPHOS 165*  --   --   --   --  155*  BILITOT 8.6*  --   --   --   --  9.1*  PROT 7.6  --   --   --   --  7.2  ALBUMIN 2.2* 2.1* 1.9* 1.8* 1.8* 1.8*  1.8*   Recent Labs  Lab 03/29/23 1158  LIPASE 41  AMYLASE 71   No results for input(s): "AMMONIA" in the last 168 hours.  ABG    Component Value Date/Time   PHART 7.452 (H) 03/30/2023 1252   PCO2ART 33.2 03/30/2023 1252   PO2ART 166 (H) 03/30/2023 1252   HCO3 23.2 03/30/2023 1252   TCO2 24 03/30/2023 1252   ACIDBASEDEF 5.0 (H) 03/29/2023 2120   O2SAT 100 03/30/2023 1252     Coagulation Profile: Recent Labs  Lab 03/29/23 1158  INR 1.8*  Cardiac Enzymes: No results for input(s): "CKTOTAL", "CKMB", "CKMBINDEX", "TROPONINI" in the last 168 hours.  HbA1C: Hgb A1c MFr Bld  Date/Time Value Ref Range Status  03/29/2023 11:58 AM 5.7 (H) 4.8 - 5.6 % Final    Comment:    (NOTE) Pre diabetes:          5.7%-6.4%  Diabetes:              >6.4%  Glycemic control for   <7.0% adults with diabetes   04/05/2014 04:19 AM 6.0 (H) <5.7 % Final    Comment:    (NOTE)                                                                        According to the ADA Clinical Practice Recommendations for 2011, when HbA1c is used as a screening test:  >=6.5%   Diagnostic of Diabetes Mellitus           (if abnormal result is confirmed) 5.7-6.4%   Increased risk of developing Diabetes Mellitus References:Diagnosis and Classification of Diabetes Mellitus,Diabetes Care,2011,34(Suppl 1):S62-S69 and Standards of Medical Care in         Diabetes - 2011,Diabetes Care,2011,34 (Suppl 1):S11-S61.    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, severe mitral regurgitation, and septic embolization (03/21/2014), Chronic diastolic congestive heart failure (HCC), DVT of upper extremity (deep vein thrombosis) (HCC) (03/04/2014), ESRD (end stage renal disease) on dialysis [facility]), Hypertension, 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   Surgical History:   Past Surgical History:  Procedure Laterality Date   AV FISTULA PLACEMENT Left ?2005   forearm    BASCILIC VEIN TRANSPOSITION Left 12/27/2015   Procedure: FIRST STAGE BASILIC VEIN TRANSPOSITION;  Surgeon: Fransisco Hertz, MD;  Location: [facility] OR;  Service: Vascular;  Laterality: Left;   BASCILIC VEIN TRANSPOSITION Left 02/12/2016   Procedure: SECOND STAGE BASILIC VEIN TRANSPOSITION;   Surgeon: Fransisco Hertz, MD;  Location: [facility] OR;  Service: Vascular;  Laterality: Left;   EMBOLECTOMY Left 03/25/2014   Procedure: EMBOLECTOMY left popliteal;  Surgeon: Larina Earthly, MD;  Location: [facility] OR;  Service: Vascular;  Laterality: Left;   FEMORAL-POPLITEAL BYPASS GRAFT Left 03/25/2014   Procedure: Left Femoral- Below Knee Popliteal Bypass Graft;  Surgeon: Larina Earthly, MD;  Location: [facility] OR;  Service: Vascular;  Laterality: Left;   INTRAOPERATIVE TRANSESOPHAGEAL ECHOCARDIOGRAM N/A 04/05/2014   Procedure: INTRAOPERATIVE TRANSESOPHAGEAL ECHOCARDIOGRAM;  Surgeon: Purcell Nails, MD;  [facility] OR;  Service: Open Heart Surgery;  Laterality: N/A;   LEFT AND RIGHT HEART CATHETERIZATION WITH CORONARY ANGIOGRAM N/A 03/28/2014   Procedure: LEFT AND RIGHT HEART CATHETERIZATION WITH CORONARY  ANGIOGRAM;  Surgeon: Laurey Morale, MD;  Location: [facility] CATH LAB;  Service: Cardiovascular;  Laterality: N/A;   MITRAL VALVE REPAIR Right 04/05/2014   Procedure: MINIMALLY INVASIVE MITRAL VALVE REPAIR (MVR);  Surgeon: Purcell Nails, MD;  Location: [facility] OR;  Service: Open Heart Surgery;  Laterality: Right;   MITRAL VALVE REPLACEMENT Right 04/05/2014   Procedure: Bring back MINIMALLY INVASIVE MITRAL VALVE (MV) REPLACEMENT - Reexploration for bleeding;  Surgeon: Purcell Nails, MD;  Location: [facility] OR;  Service: Open Heart Surgery;  Laterality: Right;   PARATHYROIDECTOMY N/A 03/22/2016   Procedure: PARATHYROIDECTOMY AUTOTRANSPLANT;  Surgeon: Darnell Level, MD;  Location: [facility] OR;  Service: General;  Laterality: N/A;   PATCH ANGIOPLASTY Left 07/23/2013   Procedure: PATCH ANGIOPLASTY- LEFT RADIOCEPHALIC ARTERIOVENOUS FISTULA;  Surgeon: Chuck Hint, MD;  Location: [facility] OR;  Service: Vascular;  Laterality: Left;   REVISON OF ARTERIOVENOUS FISTULA Left 11/04/2019   Procedure: REVISON OF LEFT ARM ARTERIOVENOUS FISTULA;  Surgeon: Larina Earthly, MD;  Location: [facility] OR;  Service: Vascular;  Laterality: Left;   SHUNTOGRAM Left November 04, 2011   TEE WITHOUT CARDIOVERSION N/A 03/24/2014   Procedure: TRANSESOPHAGEAL ECHOCARDIOGRAM (TEE);  Surgeon: Laurey Morale, MD;  Location: [facility] ENDOSCOPY;  Service: Cardiovascular;  Laterality: N/A;   TEE WITHOUT CARDIOVERSION N/A 04/12/2015   Procedure: TRANSESOPHAGEAL ECHOCARDIOGRAM (TEE);  Surgeon: Laurey Morale, MD;  Location: [facility] ENDOSCOPY;  Service: Cardiovascular;  Laterality: N/A;   TEE WITHOUT CARDIOVERSION N/A 08/18/2015   Procedure: TRANSESOPHAGEAL ECHOCARDIOGRAM (TEE);  Surgeon: Laurey Morale, MD;  Location: [facility] ENDOSCOPY;  Service: Cardiovascular;  Laterality: N/A;     Social History:   reports that he has never smoked. He has never used smokeless tobacco. He reports current alcohol use. He reports current drug use. Frequency: 7.00 times per week. Drug: Marijuana.   Family History:  His family history includes Diabetes in his mother; Hypertension in his brother, father, mother, and sister.   Allergies No Known Allergies   Home Medications  Prior to Admission medications   Medication Sig Start Date End Date Taking? Authorizing Provider  lisinopril (PRINIVIL,ZESTRIL) 20 MG tablet Take 20 mg by mouth daily.   Yes [provider]  multivitamin (RENA-VIT) TABS tablet Take 1 tablet by mouth at bedtime. Patient taking differently: Take 1 tablet by mouth 3 (three) times a week. 04/14/15  Yes Hongalgi, Maximino Greenland, MD  sevelamer carbonate (RENVELA) 800 MG tablet Take 1,600 mg by mouth daily.   Yes [provider]  Nutritional Supplements (FEEDING SUPPLEMENT, NEPRO CARB STEADY,) LIQD Take 237 mLs by mouth 2 (two) times daily between meals. Patient not taking: Reported on 11/04/2019 04/14/15   Elease Etienne, MD     Critical care time: 40 mins     Modena Slater, DO Internal Medicine Resident PGY-2 Pager: [phone]

## 2023-04-02 ENCOUNTER — Encounter (HOSPITAL_COMMUNITY): Payer: Self-pay | Admitting: Internal Medicine

## 2023-04-02 DIAGNOSIS — J9601 Acute respiratory failure with hypoxia: Secondary | ICD-10-CM | POA: Diagnosis not present

## 2023-04-02 DIAGNOSIS — R9431 Abnormal electrocardiogram [ECG] [EKG]: Secondary | ICD-10-CM | POA: Diagnosis not present

## 2023-04-02 DIAGNOSIS — I472 Ventricular tachycardia, unspecified: Secondary | ICD-10-CM

## 2023-04-02 DIAGNOSIS — R579 Shock, unspecified: Secondary | ICD-10-CM | POA: Diagnosis not present

## 2023-04-02 LAB — RENAL FUNCTION PANEL
Albumin: 1.7 g/dL — ABNORMAL LOW (ref 3.5–5.0)
Albumin: 1.7 g/dL — ABNORMAL LOW (ref 3.5–5.0)
Anion gap: 11 (ref 5–15)
Anion gap: 9 (ref 5–15)
BUN: 33 mg/dL — ABNORMAL HIGH (ref 6–20)
BUN: 26 mg/dL — ABNORMAL HIGH (ref 6–20)
CO2: 22 mmol/L (ref 22–32)
CO2: 23 mmol/L (ref 22–32)
Calcium: 7.4 mg/dL — ABNORMAL LOW (ref 8.9–10.3)
Calcium: 7.4 mg/dL — ABNORMAL LOW (ref 8.9–10.3)
Chloride: 99 mmol/L (ref 98–111)
Chloride: 103 mmol/L (ref 98–111)
Creatinine, Ser: 2.26 mg/dL — ABNORMAL HIGH (ref 0.61–1.24)
Creatinine, Ser: 2.1 mg/dL — ABNORMAL HIGH (ref 0.61–1.24)
GFR, Estimated: 33 mL/min — ABNORMAL LOW (ref >60)
GFR, Estimated: 36 mL/min — ABNORMAL LOW (ref >60)
Glucose, Bld: 181 mg/dL — ABNORMAL HIGH (ref 70–99)
Glucose, Bld: 167 mg/dL — ABNORMAL HIGH (ref 70–99)
Phosphorus: 2.7 mg/dL (ref 2.5–4.6)
Phosphorus: 2.5 mg/dL (ref 2.5–4.6)
Potassium: 2.8 mmol/L — ABNORMAL LOW (ref 3.5–5.1)
Potassium: 3.2 mmol/L — ABNORMAL LOW (ref 3.5–5.1)
Sodium: 132 mmol/L — ABNORMAL LOW (ref 135–145)
Sodium: 135 mmol/L (ref 135–145)

## 2023-04-02 LAB — CBC
HCT: 29.6 % — ABNORMAL LOW (ref 39.0–52.0)
Hemoglobin: 10.4 g/dL — ABNORMAL LOW (ref 13.0–17.0)
MCH: 28.9 pg (ref 26.0–34.0)
MCHC: 35.1 g/dL (ref 30.0–36.0)
MCV: 82.2 fL (ref 80.0–100.0)
Platelets: 125 10*3/uL — ABNORMAL LOW (ref 150–400)
RBC: 3.6 MIL/uL — ABNORMAL LOW (ref 4.22–5.81)
RDW: 16.2 % — ABNORMAL HIGH (ref 11.5–15.5)
WBC: 17.1 10*3/uL — ABNORMAL HIGH (ref 4.0–10.5)
nRBC: 8.1 % — ABNORMAL HIGH (ref 0.0–0.2)

## 2023-04-02 LAB — APTT
aPTT: 116 seconds — ABNORMAL HIGH (ref 24–36)
aPTT: 74 seconds — ABNORMAL HIGH (ref 24–36)

## 2023-04-02 LAB — HEPARIN LEVEL (UNFRACTIONATED): Heparin Unfractionated: 0.24 IU/mL — ABNORMAL LOW (ref 0.30–0.70)

## 2023-04-02 LAB — GLUCOSE, CAPILLARY
Glucose-Capillary: 188 mg/dL — ABNORMAL HIGH (ref 70–99)
Glucose-Capillary: 163 mg/dL — ABNORMAL HIGH (ref 70–99)
Glucose-Capillary: 169 mg/dL — ABNORMAL HIGH (ref 70–99)
Glucose-Capillary: 159 mg/dL — ABNORMAL HIGH (ref 70–99)
Glucose-Capillary: 155 mg/dL — ABNORMAL HIGH (ref 70–99)
Glucose-Capillary: 156 mg/dL — ABNORMAL HIGH (ref 70–99)

## 2023-04-02 LAB — COOXEMETRY PANEL
Carboxyhemoglobin: 2 % — ABNORMAL HIGH (ref 0.5–1.5)
Methemoglobin: <0.7 % (ref 0.0–1.5)
O2 Saturation: 69.6 %
Total hemoglobin: 11.2 g/dL — ABNORMAL LOW (ref 12.0–16.0)

## 2023-04-02 LAB — PHOSPHORUS: Phosphorus: 2.7 mg/dL (ref 2.5–4.6)

## 2023-04-02 LAB — MAGNESIUM: Magnesium: 2.6 mg/dL — ABNORMAL HIGH (ref 1.7–2.4)

## 2023-04-02 MED ORDER — PRISMASOL BGK 4/2.5 32-4-2.5 MEQ/L EC SOLN
Status: DC
  Filled 2023-04-02 (×67): qty 5000

## 2023-04-02 MED ORDER — SENNOSIDES 8.8 MG/5ML PO SYRP
10.0000 mL | ORAL_SOLUTION | Freq: Every day | ORAL | Status: DC
  Administered 2023-04-02: 10 mL
  Filled 2023-04-02: qty 10

## 2023-04-02 NOTE — Progress Notes (Signed)
Notified by bedside RN that AM K 2.8 Changed to all 4K bath on CRRT  Bufford Buttner MD [facility] Pgr [phone]

## 2023-04-02 NOTE — Progress Notes (Signed)
Rounding Note    Patient Name: Timothy Palmer Date of Encounter: 04/02/2023  [facility] Cardiologist: Little Ishikawa, MD   Subjective   Net negative 1L yesterday on CRRT. Intubated, sedated, following some commands.  On levophed at 6 mcg/min and vasopressin and milrinone 0.125.  FiO2 30%, PEEP 5.  Inpatient Medications    Scheduled Meds:  calcitRIOL  0.75 mcg Per Tube Q T,Th,Sat-1800   Chlorhexidine Gluconate Cloth  6 each Topical Daily   docusate  100 mg Per Tube BID   feeding supplement (PROSource TF20)  60 mL Per Tube BID   insulin aspart  0-15 Units Subcutaneous Q4H   mouth rinse  15 mL Mouth Rinse Q2H   pantoprazole (PROTONIX) IV  40 mg Intravenous Daily   polyethylene glycol  17 g Per Tube Daily   Continuous Infusions:   prismasol BGK 4/2.5 400 mL/hr at 04/02/23 0415    prismasol BGK 4/2.5 400 mL/hr at 04/02/23 0414   sodium chloride     ceFEPime (MAXIPIME) IV Stopped (04/01/23 2030)   feeding supplement (PIVOT 1.5 CAL) 55 mL/hr at 04/02/23 0600   fentaNYL infusion INTRAVENOUS 150 mcg/hr (04/02/23 0600)   heparin 1,700 Units/hr (04/02/23 0600)   milrinone 0.125 mcg/kg/min (04/02/23 0600)   norepinephrine (LEVOPHED) Adult infusion 7 mcg/min (04/02/23 0600)   prismasol BGK 4/2.5 1,500 mL/hr at 04/02/23 0622   vasopressin 0.04 Units/min (04/02/23 0600)   PRN Meds: fentaNYL, Gerhardt's butt cream, heparin, lip balm, midazolam, mouth rinse   Vital Signs    Vitals:   04/02/23 0615 04/02/23 0630 04/02/23 0645 04/02/23 0733  BP:      Pulse: 63 65 66   Resp: 20 19 19  (!) 22  Temp:    98.4 F (36.9 C)  TempSrc:    Oral  SpO2: 100% 100% 100%   Weight:      Height:        Intake/Output Summary (Last 24 hours) at 04/02/2023 0805 Last data filed at 04/02/2023 0600 Gross per 24 hour  Intake 2029.43 ml  Output 2947.5 ml  Net -918.07 ml      04/02/2023    3:14 AM 04/01/2023    1:28 AM 03/31/2023    2:22 AM  Last 3 Weights  Weight (lbs) 239 lb  3.2 oz 239 lb 3.2 oz 239 lb 3.2 oz  Weight (kg) 108.5 kg 108.5 kg 108.5 kg      Telemetry    NSR, brief NSVT, PACs/PVCs - Personally Reviewed  ECG    Sinus, incomplete right bundle branch block, right axis deviation, diffuse T wave inversions, QTc 560- Personally Reviewed  Physical Exam   GEN: intubated, sedated  Neck: No JVD appreciated Cardiac: RRR, 3/6 systolic murmur Respiratory: Clear to auscultation bilaterally in anterior fields GI: Soft, nontender, non-distended  MS: No edema Neuro:  following some commands Psych: Unable to assess  Labs    High Sensitivity Troponin:   Recent Labs  Lab 03/29/23 1912 03/29/23 2214 03/29/23 2352 03/30/23 0134 03/30/23 0555  TROPONINIHS 1,425* 1,143* 1,293* 1,254* 1,601*     Chemistry Recent Labs  Lab 03/29/23 1013 03/29/23 1118 04/01/23 0417 04/01/23 1542 04/01/23 1718 04/02/23 0441 04/02/23 0454  NA 135   < > 131* 136 138  --  132*  K 5.9*   < > 3.5 3.4* 3.5  --  2.8*  CL 89*   < > 97* 97*  --   --  99  CO2 17*   < > 19*  21*  --   --  22  GLUCOSE 64*   < > 221* 145*  --   --  181*  BUN 70*   < > 35* 33*  --   --  33*  CREATININE 10.07*   < > 2.80* 2.42*  --   --  2.26*  CALCIUM 7.0*   < > 7.1* 7.7*  --   --  7.4*  MG 2.6*   < > 2.6* 2.6*  --  2.6*  --   PROT 7.6  --  7.2  --   --   --   --   ALBUMIN 2.2*   < > 1.8*  1.8* 1.8*  --   --  1.7*  AST 445*  --  1,061*  --   --   --   --   ALT 96*  --  550*  --   --   --   --   ALKPHOS 165*  --  155*  --   --   --   --   BILITOT 8.6*  --  9.1*  --   --   --   --   GFRNONAA 6*   < > 26* 31*  --   --  33*  ANIONGAP 29*   < > 15 18*  --   --  11   < > = values in this interval not displayed.    Lipids No results for input(s): "CHOL", "TRIG", "HDL", "LABVLDL", "LDLCALC", "CHOLHDL" in the last 168 hours.  Hematology Recent Labs  Lab 03/31/23 0224 04/01/23 0417 04/01/23 1718 04/02/23 0441  WBC 19.6* 18.6*  --  17.1*  RBC 3.68* 3.74*  --  3.60*  HGB 10.8* 10.5* 12.6*  10.4*  HCT 30.4* 30.8* 37.0* 29.6*  MCV 82.6 82.4  --  82.2  MCH 29.3 28.1  --  28.9  MCHC 35.5 34.1  --  35.1  RDW 15.9* 16.5*  --  16.2*  PLT 167 156  --  125*   Thyroid No results for input(s): "TSH", "FREET4" in the last 168 hours.  BNP Recent Labs  Lab 03/29/23 1158  BNP 2,670.6*    DDimer No results for input(s): "DDIMER" in the last 168 hours.   Radiology    CARDIAC CATHETERIZATION  Result Date: 04/01/2023 Findings: RA = 17 RV = 83/17 PA = 77/25 (42) PCW = 12 Fick cardiac output/index = 3.3/1.5 Thermo CO/CI = 3.6/1.6 PVR = 9 WU FA sat = 99% PA sat = 35%, 35% PAPI = 3.0 Assessment: 1. Cardiogenic shock with moderate to severe PAH and end-stage RV failure Plan/Discussion: Will start milrinone to see if we can low pulmonary pressures and increase output to facilitate extubation. Triple lumen place to help with access and enable monitoring of CVP and co-ox. Arvilla Meres, MD 6:10 PM  DG Abd Portable 1V  Result Date: 04/01/2023 CLINICAL DATA:  Ileus EXAM: PORTABLE ABDOMEN - 1 VIEW COMPARISON:  CT scan 03/29/2023.  X-ray 03/31/2023. FINDINGS: Diffuse gaseous distention of small bowel and colon noted in the abdomen and pelvis, stable to minimally decreased since yesterday's x-ray. NG tube tip is potentially transpyloric into the proximal duodenum. Rim calcified right renal cyst noted over the right upper iliac bone is seen on recent CT scan. IMPRESSION: 1. Diffuse gaseous distention of small bowel and colon, stable to minimally decreased since yesterday's x-ray. 2. NG tube tip is potentially transpyloric into the proximal duodenum. Electronically Signed   By: Jamison Oka.D.  On: 04/01/2023 13:18   ECHO TEE  Result Date: 03/31/2023    TRANSESOPHOGEAL ECHO REPORT   Patient Name:   Timothy Palmer [facility] Date of Exam: 03/31/2023 Medical Rec #:  [id number]      Height:       71.0 in Accession #:    [id number]     Weight:       239.2 lb Date of Birth:  May 30, 1966     BSA:          2.275 m  Patient Age:    57 years       BP:           125/49 mmHg Patient Gender: M              HR:           58 bpm. Exam Location:  Inpatient Procedure: Transesophageal Echo, Color Doppler, Cardiac Doppler and 3D Echo Indications:     Endocarditis  History:         Patient has prior history of Echocardiogram examinations, most                  recent 03/29/2023. Right Heart Failure, Prior Cardiac Surgery,                  ESRD, Aortic Valve Disease, Mitral Valve Disease, Endocarditis,                  Prosthetic Valve Complications and Minimally Invasive MV                  repair; Risk Factors:Hypotension.                   Mitral Valve: prosthetic annuloplasty ring valve is present in                  the mitral position.  Sonographer:     [facility] Referring Phys:  [id number] Little Ishikawa Diagnosing Phys: Epifanio Lesches MD  Sonographer Comments: TEE probe [id number] used for procedure. PROCEDURE: After discussion of the risks and benefits of a TEE, an informed consent was obtained from a family member. The transesophogeal probe was passed without difficulty through the esophogus of the patient. Imaged were obtained with the patient in a supine position. Sedation performed by different physician. Patients was under conscious sedation during this procedure. Anesthetic administered: of Fentanyl, 4.0mg  of Versed. Image quality was good. The patient's vital signs; including heart rate, blood pressure, and oxygen saturation; remained stable throughout the procedure. The patient developed no complications during the procedure.  IMPRESSIONS  1. Left ventricular ejection fraction, by estimation, is 55 to 60%. The left ventricle has normal function.  2. Right ventricular systolic function is severely reduced. The right ventricular size is severely enlarged. There is severely elevated pulmonary artery systolic pressure.  3. No left atrial/left atrial appendage thrombus was detected.  4. Right atrial size was moderately  dilated.  5. The mitral valve has been repaired/replaced. Mild mitral valve regurgitation. The mean mitral valve gradient is 4.0 mmHg. There is a prosthetic annuloplasty ring present in the mitral position.  6. Tricuspid valve regurgitation is mild to moderate.  7. The aortic valve is tricuspid. There is severe calcifcation of the aortic valve. Aortic valve regurgitation is not visualized. Severe aortic valve stenosis. Aortic valve mean gradient measures 40.0 mmHg. Aortic valve Vmax measures 4.13 m/s.  8. No vegetation seen FINDINGS  Left Ventricle: Left ventricular ejection fraction, by  estimation, is 55 to 60%. The left ventricle has normal function. The left ventricular internal cavity size was normal in size. Right Ventricle: The right ventricular size is severely enlarged. Right vetricular wall thickness was not well visualized. Right ventricular systolic function is severely reduced. There is severely elevated pulmonary artery systolic pressure. The tricuspid regurgitant velocity is 4.36 m/s, and with an assumed right atrial pressure of 8 mmHg, the estimated right ventricular systolic pressure is 84.0 mmHg. Left Atrium: Left atrial size was normal in size. No left atrial/left atrial appendage thrombus was detected. Right Atrium: Right atrial size was moderately dilated. Pericardium: There is no evidence of pericardial effusion. Mitral Valve: The mitral valve has been repaired/replaced. Mild mitral valve regurgitation. There is a prosthetic annuloplasty ring present in the mitral position. MV peak gradient, 8.0 mmHg. The mean mitral valve gradient is 4.0 mmHg with average heart rate of 59 bpm. Tricuspid Valve: The tricuspid valve is normal in structure. Tricuspid valve regurgitation is mild to moderate. Aortic Valve: The aortic valve is tricuspid. There is severe calcifcation of the aortic valve. Aortic valve regurgitation is not visualized. Severe aortic stenosis is present. Aortic valve mean gradient measures  40.0 mmHg. Aortic valve peak gradient measures 68.2 mmHg. Aortic valve area, by VTI measures 0.96 cm. Pulmonic Valve: The pulmonic valve was grossly normal. Pulmonic valve regurgitation is not visualized. Aorta: The aortic root is normal in size and structure. IAS/Shunts: No atrial level shunt detected by color flow Doppler. Additional Comments: Spectral Doppler performed. LEFT VENTRICLE PLAX 2D LVOT diam:     2.00 cm LV SV:         80 LV SV Index:   35 LVOT Area:     3.14 cm  AORTIC VALVE AV Area (Vmax):    0.99 cm AV Area (Vmean):   0.95 cm AV Area (VTI):     0.96 cm AV Vmax:           413.00 cm/s AV Vmean:          295.000 cm/s AV VTI:            0.838 m AV Peak Grad:      68.2 mmHg AV Mean Grad:      40.0 mmHg LVOT Vmax:         130.00 cm/s LVOT Vmean:        89.600 cm/s LVOT VTI:          0.255 m LVOT/AV VTI ratio: 0.30 MITRAL VALVE             TRICUSPID VALVE MV Area VTI:  1.30 cm   TR Peak grad:   76.0 mmHg MV Peak grad: 8.0 mmHg   TR Vmax:        436.00 cm/s MV Mean grad: 4.0 mmHg MV Vmax:      1.41 m/s   SHUNTS MV Vmean:     97.5 cm/s  Systemic VTI:  0.26 m                          Systemic Diam: 2.00 cm Epifanio Lesches MD Electronically signed by Epifanio Lesches MD Signature Date/Time: 03/31/2023/5:06:03 PM    Final    DG Abd Portable 1V  Result Date: 03/31/2023 CLINICAL DATA:  Nasogastric tube placement. EXAM: PORTABLE ABDOMEN - 1 VIEW COMPARISON:  March 29, 2023. FINDINGS: Distal tip of nasogastric tube is seen in expected position of distal stomach. IMPRESSION: Distal tip of nasogastric tube is  seen in expected position of distal stomach. Electronically Signed   By: Lupita Raider M.D.   On: 03/31/2023 11:58    Cardiac Studies     Patient Profile     57 y.o. 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  Assessment & Plan   Shock:  Lactate 12 on admission.  Significantly elevated procalcitonin.  Blood cultures NGTD. Cardiogenic versus septic.  Echocardiogram 9/21 showed EF 60 to 65%, grade 1 diastolic dysfunction, moderate RV dysfunction, severe RV enlargement, severe pulmonary hypertension (RVSP 73), status post mitral valve repair, mean gradient 4 mmHg across mitral valve, severe aortic stenosis (mean gradient 33, V-max 4.0 m/s, AVA 0.5 cm, DI 0.23), could not rule out AV vegetation.  TEE 9/23 showed no vegetation, normal LV function, severe RV dysfunction, severe aortic stenosis, severe pulmonary hypertension.  RHC on 924 showed findings consistent with cardiogenic shock with end-stage RV failure, moderate to severe PAH (RA 17, RV 83/17, PA 77/25/42, PCWP 12, CI 1.5, PA sat 35%). -Started on milrinone for RV failure.  Coox significantly improved, 70% this morning.  Continue milrinone -Continue to monitor Co-ox and CVP.  Goal CVP 10-15, will need to be cautious about dropping filling pressures too low in setting of severe RV failure and severe aortic stenosis.  CVP 8 this morning, recommend backing off volume removal -Degree of pulmonary hypertension suggests chronic RV failure.  CTPA planned to rule out PE and also evaluate for source of infection -Blood cultures no growth to date.  Abx per primary team  Aortic stenosis: Severe AS on echo, will need evaluation for TAVR pending clinical course  VT: Required cardioversion for VT on 9/21.  Suspected due to hyperkalemia (K 6.9) and prolonged QTc (612).  Now on CRRT, potassium improved.  QTc 596 on EKG yesterday.  Calcium low, received repletion.  Repeat ECG 9/24 shows improvement with Qtc 560   AHRF: remains intubated.  Per PCCM   CRITICAL CARE TIME: I have spent a total of 33 minutes with patient reviewing hospital notes, telemetry, EKGs, labs and examining the patient as well as establishing an assessment and plan.    Discussed with Dr Denese Killings. > 50% of time was spent in  direct patient care. The patient is critically ill with multi-organ system failure and requires high complexity decision making for assessment and support, frequent evaluation and titration of therapies, application of advanced monitoring technologies and extensive interpretation of multiple databases.   For questions or updates, please contact [facility] Please consult [email or web address] for contact info under        Signed, Little Ishikawa, MD  04/02/2023, 8:05 AM

## 2023-04-02 NOTE — Progress Notes (Signed)
ANTICOAGULATION CONSULT NOTE  Pharmacy Consult for Heparin Indication: ACS vs VTE  No Known Allergies  Patient Measurements: Height: 5\' 11"  (180.3 cm) Weight: 108.5 kg (239 lb 3.2 oz) IBW/kg (Calculated) : 75.3 Heparin Dosing Weight: 100 kg  Vital Signs: Temp: 94 F (34.4 C) (09/24 2338) Temp Source: Axillary (09/24 2338) BP: 139/70 (09/24 1752) Pulse Rate: 51 (09/25 0030)  Labs: Recent Labs    03/30/23 0134 03/30/23 0205 03/30/23 0555 03/30/23 1200 03/31/23 0224 03/31/23 1604 04/01/23 0417 04/01/23 1542 04/01/23 1718 04/01/23 2325  HGB 11.6*   < >  --    < > 10.8*  --  10.5*  --  12.6*  --   HCT 32.7*   < >  --    < > 30.4*  --  30.8*  --  37.0*  --   PLT 233  --   --   --  167  --  156  --   --   --   APTT  --    < >  --    < > 75*  --  57* 82*  --  116*  HEPARINUNFRC  --    < >  --    < > 0.21*  --  0.17* 0.28*  --   --   CREATININE  --    < >  --    < > 4.31* 3.34* 2.80* 2.42*  --   --   TROPONINIHS 1,254*  --  1,601*  --   --   --   --   --   --   --    < > = values in this interval not displayed.    Estimated Creatinine Clearance: 43.2 mL/min (A) (by C-G formula based on SCr of 2.42 mg/dL (H)).   Assessment: 45 YOM presenting with shock. Significant cardiac history including MSSA endocarditis, mitral regurgitation, aortic stenosis. Also hx of ESRD and DVT. Presented with elevated troponins and ECHO showed moderate RV dysfunction and RV enlargement. Pharmacy consulted to dose heparin to rule out PE with potential ACS. No AC PTA  aPTT 116 - supra therapeutic   Goal of Therapy:  Heparin level 0.3-0.7 units/ml aPTT 66-102 seconds Monitor platelets by anticoagulation protocol: Yes   Plan:  Decrease heparin infusion to 1700 units/hr Continue to use aPTT to guide dosing w/ elevated Tbili, f/u Tbili levels Check heparin level in 6 hours and daily while on heparin Continue to monitor H&H and platelets   Thank you for allowing pharmacy to be a part of this  patient's care.  Calton Dach, PharmD, BCCCP Clinical Pharmacist 04/02/2023 12:44 AM

## 2023-04-02 NOTE — Progress Notes (Signed)
Pharmacy ICU Bowel Regimen Consult Note   Current Inpatient Medications for Bowel Management:  Docusate 100 mg BID  Miralax 17g daily   Assessment: Timothy Palmer is a 57 y.o. year old male admitted on 03/29/2023. Constipation identified as acute opioid-induced constipation . Bowel regimen assessment completed by Aggie Cosier (nurse name) on 9/25 (date). LBM PTA.  []  Bowel sounds present  [x]  No abdominal tenderness  [x]  Passing gas   Plan: Start senosides 10 mL at bedtime  MD contacted (if needed):  Thank you for allowing pharmacy to participate in this patient's care.  Griffin Dakin 04/02/2023,3:29 PM

## 2023-04-02 NOTE — Progress Notes (Addendum)
NAME:  Timothy Palmer, MRN:  [id number], DOB:  12/26/1965, LOS: 4 ADMISSION DATE:  03/29/2023, CONSULTATION DATE:  03/29/2023 REFERRING MD: EDP, CHIEF COMPLAINT: Shock  History of Present Illness:  57 year old male patient with history of ESRD ( T, Th, Sat) , Systolic and diastolic CHF, HTN, Mitral valve repair ( 2015) and replacement 2/2 MSSA  endocarditis(2016), Mycotic aneurysm, Upper extremity DVT(2015) and PVD. Presented to the Ed 9/21 with history or 4 days of diarrhea which resolved 9/20, with poor po intake and fatigue with weakness and body aches. In the ED he had Lactate of 11.9, Troponin of 900, bedside echo by ED MD showed decreased EF. Last echo 2016 showed EF of 50-55%. He is not followed by cardiology. He is not on blood thinners as they caused his HD graph to bleed. PCCM were notified by ED MD  and asked to admit the patient and manage care .  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   Significant Hospital Events: Including procedures, antibiotic start and stop dates in addition to other pertinent events   03/29/2023 Admission  - Stated he is weak and tired, has no energy.Currently on 2 L [place] with sats of 100%,HD is due today,  9/21> echo with severe RV enlargement and pulmonary hypertension severe arctic stenosis that is calcified.  Nephrology started CRRT due to shock.  Patient went on pressors.  HD cath placed.  Later intubated.  Also had had wide-complex rhythm agonal respirations but appears did not lose any pulses.  Received cardioversion received stat hyperkalemia treatment.  Status post arterial line insertion  Interim History / Subjective:   Overnight events: Patient did get heart cath. Patient found to have cardiogenic shock with moderate to severe pulmonary artery hypertension and end-stage right ventricular failure.  He was started on milrinone.  Patient had triple-lumen placed to check Coox  Patient valuated bedside this morning.  Patient is intubated and sedated.  Not able to follow any instructions.  Not able to participate in interview.  Objective   Blood pressure 139/70, pulse 66, temperature 98.4 F (36.9 C), temperature source Oral, resp. rate (!) 22, height 5\' 11"  (1.803 m), weight 108.5 kg, SpO2 100%.   Vent Mode: PRVC FiO2 (%):  [30 %] 30 % Set Rate:  [20 bmp] 20 bmp Vt Set:  [600 mL] 600 mL PEEP:  [5 cmH20] 5 cmH20 Plateau Pressure:  [24 cmH20-26 cmH20] 26 cmH20   Intake/Output Summary (Last 24 hours) at 04/02/2023 1022 Last data filed at 04/02/2023 0600 Gross per 24 hour  Intake 1779.44 ml  Output 2649.5 ml  Net -870.06 ml   Filed Weights   03/31/23 0222 04/01/23 0128 04/02/23 0314  Weight: 108.5 kg 108.5 kg 108.5 kg    Examination: General: In acute distress, intubated and sedated HENT: Normocephalic, atraumatic, ET tube in place Lungs: Clear to auscultation bilaterally Cardiovascular: Regular rate, 3/6 systolic murmur appreciated throughout, no gallops Abdomen: Soft, nontender,  nontender, normal active bowel sounds Extremities: Trace edema bilaterally to lower extremities Neuro: Intubated and sedated GU: On CRRT  Resolved Hospital Problem list     Assessment & Plan:    #Combined shock with cardiogenic and septic #Severe aortic valve stenosis Right heart cath yesterday demonstrated cardiogenic shock with severe end-stage right ventricular failure.  Will concern for cardiogenic shock, patient was started on milrinone for inotropic effort.  Patient had line placed, and will have serial COOX measurements.  Discontinued CTA given patient likely had cardiogenic shock in the setting of severe aortic valve stenosis and right ventricular failure from [facility].  Will continue with inotropic agent.  Unclear etiology about pulmonary artery hypertension.  Could be related to chronic venous thromboembolism.  Could also be related to idiopathic pulmonary hypertension versus left heart failure.  Interesting enough echo did not show evidence of left heart failure and pulmonary capillary wedge pressure was within normal limits. -Continue cefepime day 5 of 7 -Continue inotropic support with milrinone per cardiology -Continue Levophed and vasopressin -MAP goal greater than 65 -Continue hydrocortisone for stress dose steroids, will try to wean down -Cardiology following, appreciate recommendations -Once hemodynamically stable, can evaluate for TAVR -Continue heparin -Continue serial COOX measurements  #Acute hypoxemic respiratory failure Still requiring mechanical ventilation.  Did try to wake patient up yesterday, did not tolerate well.  Will continue to try to do daily SBT's.   -VAP bundle -Continue cefepime day 5 of 7 -Pulmonary toilet and hygiene -Wean off ventilator and wean sedation  #History of V. tach Patient has been sinus rhythm.  Has not had any further episodes of ventricular tachycardia.  Currently doing well. -Continue telemetry -Plan per cardiology   #ESRD #Hypokalemia #Hyponatremia Patient continues to tolerate CRRT well.  Net -1 L since yesterday.  Will continue to pull off fluids.  Electrolyte management per nephrology.  Potassium was decreased at 2.8 this  morning.  Did pull off 3.1 L yesterday.  Patient tolerated well.  Pressures have been able to tolerate it well as well. -Continue CRRT per nephrology  #Hyperglycemia Currently glucose is ranging between 1 25-1 88.  Patient is currently on sliding scale insulin.  Tolerating well.  No acute concerns. -Continue sliding scale insulin  Best Practice (right click and "Reselect all SmartList Selections" daily)   Diet/type: tubefeeds DVT prophylaxis: systemic heparin GI prophylaxis: PPI Lines: Central line Foley:  N/A and Yes, and it is still needed Code Status:  full code  Labs   CBC: Recent Labs  Lab 03/29/23 0800 03/29/23 1118 03/29/23 1912 03/29/23 2120 03/30/23 0134 03/30/23 0359 03/30/23 1252 03/31/23 0224 04/01/23 0417 04/01/23 1718 04/02/23 0441  WBC 22.6*   < > 23.6*  --  22.2*  --   --  19.6* 18.6*  --  17.1*  NEUTROABS 20.0*  --   --   --   --   --   --   --   --   --   --   HGB 12.2*   < > 11.8*   < > 11.6*   < > 11.9* 10.8* 10.5* 12.6* 10.4*  HCT 38.4*   < > 37.6*   < > 32.7*   < > 35.0* 30.4* 30.8* 37.0* 29.6*  MCV 88.9   < > 90.8  --  81.8  --   --  82.6 82.4  --  82.2  PLT 236   < > 227  --  233  --   --  167 156  --  125*   < > = values in this interval not displayed.    Basic Metabolic Panel: Recent Labs  Lab 03/31/23 0224 03/31/23 1604 04/01/23 0417 04/01/23 1542 04/01/23 1718 04/02/23 0441 04/02/23 0454  NA 134* 134* 131* 136 138  --  132*  K 4.0 3.8 3.5 3.4* 3.5  --  2.8*  CL 97* 98 97* 97*  --   --  99  CO2 19* 19* 19* 21*  --   --  22  GLUCOSE 141* 155* 221* 145*  --   --  181*  BUN 39* 35* 35* 33*  --   --  33*  CREATININE 4.31* 3.34* 2.80* 2.42*  --   --  2.26*  CALCIUM 6.8* 7.1* 7.1* 7.7*  --   --  7.4*  MG 2.4 2.4 2.6* 2.6*  --  2.6*  --   PHOS 5.1* 5.0*  4.9* 3.7 3.1  --  2.7 2.7   GFR: Estimated Creatinine Clearance: 46.3 mL/min (A) (by C-G formula based on SCr of 2.26 mg/dL (H)). Recent Labs  Lab 03/29/23 1158 03/29/23 1655  03/29/23 1912 03/29/23 2114 03/30/23 0134 03/31/23 0224 04/01/23 0417 04/02/23 0441  PROCALCITON 47.41  --   --   --   --   --   --   --   WBC 22.1*  --  23.6*  --  22.2* 19.6* 18.6* 17.1*  LATICACIDVEN  --    < > >9.0* 6.7* 4.8* 2.0*  --   --    < > = values in this interval not displayed.    Liver Function Tests: Recent Labs  Lab 03/29/23 1013 03/30/23 0312 03/31/23 0224 03/31/23 1604 04/01/23 0417 04/01/23 1542 04/02/23 0454  AST 445*  --   --   --  1,061*  --   --   ALT 96*  --   --   --  550*  --   --   ALKPHOS 165*  --   --   --  155*  --   --   BILITOT 8.6*  --   --   --  9.1*  --   --   PROT 7.6  --   --   --  7.2  --   --   ALBUMIN 2.2*   < > 1.8* 1.8* 1.8*  1.8* 1.8* 1.7*   < > = values in this interval not displayed.   Recent Labs  Lab 03/29/23 1158  LIPASE 41  AMYLASE 71   No results for input(s): "AMMONIA" in the last 168 hours.  ABG    Component Value Date/Time   PHART 7.452 (H) 03/30/2023 1252   PCO2ART 33.2 03/30/2023 1252   PO2ART 166 (H) 03/30/2023 1252   HCO3 24.9 04/01/2023 1718   TCO2 26 04/01/2023 1718   ACIDBASEDEF 1.0 04/01/2023 1718   O2SAT 69.6 04/02/2023 0454     Coagulation Profile: Recent Labs  Lab 03/29/23 1158  INR 1.8*    Cardiac Enzymes: No results for input(s): "CKTOTAL", "CKMB", "CKMBINDEX", "TROPONINI" in the last 168 hours.  HbA1C: Hgb A1c MFr Bld  Date/Time Value Ref Range Status  03/29/2023 11:58 AM 5.7 (H) 4.8 - 5.6 % Final    Comment:    (NOTE) Pre diabetes:          5.7%-6.4%  Diabetes:              >6.4%  Glycemic control for   <7.0% adults with diabetes   04/05/2014 04:19 AM 6.0 (  H) <5.7 % Final    Comment:    (NOTE)                                                                       According to the ADA Clinical Practice Recommendations for 2011, when HbA1c is used as a screening test:  >=6.5%   Diagnostic of Diabetes Mellitus           (if abnormal result is confirmed) 5.7-6.4%    Increased risk of developing Diabetes Mellitus References:Diagnosis and Classification of Diabetes Mellitus,Diabetes Care,2011,34(Suppl 1):S62-S69 and Standards of Medical Care in         Diabetes - 2011,Diabetes Care,2011,34 (Suppl 1):S11-S61.    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, severe mitral regurgitation, and septic embolization (03/21/2014), Chronic diastolic congestive heart failure (HCC), DVT of upper extremity (deep vein thrombosis) (HCC) (03/04/2014), ESRD (end stage renal disease) on dialysis [facility]), Hypertension, 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   Surgical History:   Past Surgical History:  Procedure Laterality Date   AV FISTULA PLACEMENT Left ?2005   forearm    BASCILIC VEIN TRANSPOSITION Left 12/27/2015   Procedure: FIRST STAGE BASILIC VEIN TRANSPOSITION;  Surgeon: Fransisco Hertz, MD;  Location: [facility] OR;  Service: Vascular;  Laterality: Left;   BASCILIC VEIN TRANSPOSITION Left 02/12/2016   Procedure: SECOND STAGE BASILIC VEIN TRANSPOSITION;  Surgeon: Fransisco Hertz, MD;  Location: [facility] OR;  Service: Vascular;  Laterality: Left;   EMBOLECTOMY Left 03/25/2014   Procedure: EMBOLECTOMY left popliteal;  Surgeon: Larina Earthly, MD;  Location: [facility] OR;  Service: Vascular;   Laterality: Left;   FEMORAL-POPLITEAL BYPASS GRAFT Left 03/25/2014   Procedure: Left Femoral- Below Knee Popliteal Bypass Graft;  Surgeon: Larina Earthly, MD;  [facility] OR;  Service: Vascular;  Laterality: Left;   INTRAOPERATIVE TRANSESOPHAGEAL ECHOCARDIOGRAM N/A 04/05/2014   Procedure: INTRAOPERATIVE TRANSESOPHAGEAL ECHOCARDIOGRAM;  Surgeon: Purcell Nails, MD;  Location: [facility] OR;  Service: Open Heart Surgery;  Laterality: N/A;   LEFT AND RIGHT HEART CATHETERIZATION WITH CORONARY ANGIOGRAM N/A 03/28/2014   Procedure: LEFT AND RIGHT HEART CATHETERIZATION WITH CORONARY ANGIOGRAM;  Surgeon: Laurey Morale, MD;  Location: [facility] CATH LAB;  Service: Cardiovascular;  Laterality: N/A;   MITRAL VALVE REPAIR Right 04/05/2014   Procedure: MINIMALLY INVASIVE MITRAL VALVE REPAIR (MVR);  Surgeon: Purcell Nails, MD;  Location: [facility] OR;  Service: Open Heart Surgery;  Laterality: Right;   MITRAL VALVE REPLACEMENT Right 04/05/2014   Procedure: Bring back MINIMALLY INVASIVE MITRAL VALVE (MV) REPLACEMENT - Reexploration for bleeding;  Surgeon: Purcell Nails, MD;  Location: [facility] OR;  Service: Open Heart Surgery;  Laterality: Right;   PARATHYROIDECTOMY N/A 03/22/2016   Procedure: PARATHYROIDECTOMY AUTOTRANSPLANT;  Surgeon: Darnell Level, MD;  Location: [facility] OR;  Service: General;  Laterality: N/A;   PATCH ANGIOPLASTY Left 07/23/2013   Procedure: PATCH ANGIOPLASTY- LEFT RADIOCEPHALIC ARTERIOVENOUS FISTULA;  Surgeon: Chuck Hint, MD;  Location: [facility] OR;  Service: Vascular;  Laterality: Left;   REVISON OF ARTERIOVENOUS FISTULA Left 11/04/2019   Procedure: REVISON OF LEFT ARM ARTERIOVENOUS FISTULA;  Surgeon: Larina Earthly, MD;  Location: [facility] OR;  Service: Vascular;  Laterality: Left;   SHUNTOGRAM Left November 04, 2011   TEE WITHOUT CARDIOVERSION N/A 03/24/2014   Procedure: TRANSESOPHAGEAL ECHOCARDIOGRAM (TEE);  Surgeon: Laurey Morale, MD;  Location: [facility] ENDOSCOPY;  Service: Cardiovascular;  Laterality: N/A;   TEE WITHOUT  CARDIOVERSION N/A 04/12/2015   Procedure: TRANSESOPHAGEAL ECHOCARDIOGRAM (TEE);  Surgeon: Laurey Morale, MD;  Location: [facility] ENDOSCOPY;  Service: Cardiovascular;  Laterality: N/A;   TEE WITHOUT CARDIOVERSION N/A 08/18/2015   Procedure: TRANSESOPHAGEAL ECHOCARDIOGRAM (TEE);  Surgeon: Laurey Morale, MD;  Location: [facility] ENDOSCOPY;  Service: Cardiovascular;  Laterality: N/A;     Social History:   reports that he has never smoked. He has never used smokeless tobacco. He reports current alcohol use. He reports current drug use. Frequency: 7.00 times per week. Drug: Marijuana.   Family History:  His family history includes Diabetes in his mother; Hypertension in his brother, father, mother, and sister.   Allergies No Known Allergies   Home Medications  Prior to Admission medications   Medication Sig Start Date End Date Taking? Authorizing Provider  lisinopril (PRINIVIL,ZESTRIL) 20 MG tablet Take 20 mg by mouth daily.   Yes [provider]  multivitamin (RENA-VIT) TABS tablet Take 1 tablet by mouth at bedtime. Patient taking differently: Take 1 tablet by mouth 3 (three) times a week. 04/14/15  Yes Hongalgi, Maximino Greenland, MD  sevelamer carbonate (RENVELA) 800 MG tablet Take 1,600 mg by mouth daily.   Yes [provider]  Nutritional Supplements (FEEDING SUPPLEMENT, NEPRO CARB STEADY,) LIQD Take 237 mLs by mouth 2 (two) times daily between meals. Patient not taking: Reported on 11/04/2019 04/14/15   Elease Etienne, MD     Critical care time: 40 mins     Modena Slater, DO Internal Medicine Resident PGY-2 Pager: [phone]

## 2023-04-02 NOTE — Progress Notes (Signed)
[name] NEPHROLOGY PROGRESS NOTE  Assessment/ Plan: Pt is a 57 y.o. yo male   OP HD: [facility] (on hd since 2005)  4.5hr  450/1.5    119kg  2/2 bath  AVF LUA   Heparin 5000 - last OP HD 9/19, post wt 118.4kg  - rocaltrol 0.75 mcg po tts - sensipar 150 mg po tts - no esa (last Hb 11.6 on 9/19)  # Shock septic vs cardiogenic - on IV abx and pressor support per PCCM.  # Severe AS / RV dysfunction/V tach: Status post TEE on 9/23 with no vegetation however so severe AAS, severe RV systolic dysfunction with elevated pulmonary pressure.  RHC on 9/24 with cardiogenic shock, moderate to severe PAH and end-stage RV failure.  Started milrinone by CHF team.  # ESRD - HD TTS. Had not missed HD. CRRT started 9/21.  Potassium bath adjusted to 4K because of hypokalemia.  Judicious UF because of right ventricular failure.  # Hyperkalemia: Improved with CRRT and now adjusting potassium bath.  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  Resume binders when he starts eating.  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  No vegetation and TEE.  # VDRF: Likely extubating soon.  Subjective: Seen and examined in ICU.  Currently on Levophed 5 mics, milrinone and vasopressin 0.04.  Tolerating CRRT well with around 50 cc an hour UF.   Discussed with ICU nurse. Objective Vital signs in last 24 hours: Vitals:   04/02/23 0830 04/02/23 0900 04/02/23 0930 04/02/23 1000  BP:      Pulse: 71 71 70 65  Resp: 18 20 18 18   Temp:      TempSrc:      SpO2: 100% 100% 100% 100%  Weight:      Height:       Weight change: 0 kg  Intake/Output Summary (Last 24 hours) at 04/02/2023 1047 Last data filed at 04/02/2023 1000 Gross per 24 hour  Intake 2197 ml  Output 2649.5 ml  Net -452.5 ml       Labs: RENAL PANEL Recent Labs  Lab 03/31/23 0224 03/31/23 1604 04/01/23 0417 04/01/23 1542 04/01/23 1718  04/02/23 0441 04/02/23 0454  NA 134* 134* 131* 136 138  --  132*  K 4.0 3.8 3.5 3.4* 3.5  --  2.8*  CL 97* 98 97* 97*  --   --  99  CO2 19* 19* 19* 21*  --   --  22  GLUCOSE 141* 155* 221* 145*  --   --  181*  BUN 39* 35* 35* 33*  --   --  33*  CREATININE 4.31* 3.34* 2.80* 2.42*  --   --  2.26*  CALCIUM 6.8* 7.1* 7.1* 7.7*  --   --  7.4*  MG 2.4 2.4 2.6* 2.6*  --  2.6*  --   PHOS 5.1* 5.0*  4.9* 3.7 3.1  --  2.7 2.7  ALBUMIN 1.8* 1.8* 1.8*  1.8* 1.8*  --   --  1.7*    Liver Function Tests: Recent Labs  Lab 03/29/23 1013 03/30/23 0312 04/01/23 0417 04/01/23 1542 04/02/23 0454  AST 445*  --  1,061*  --   --   ALT 96*  --  550*  --   --   ALKPHOS 165*  --  155*  --   --   BILITOT 8.6*  --  9.1*  --   --  PROT 7.6  --  7.2  --   --   ALBUMIN 2.2*   < > 1.8*  1.8* 1.8* 1.7*   < > = values in this interval not displayed.   Recent Labs  Lab 03/29/23 1158  LIPASE 41  AMYLASE 71   No results for input(s): "AMMONIA" in the last 168 hours. CBC: Recent Labs    03/30/23 1252 03/31/23 0224 04/01/23 0417 04/01/23 1718 04/02/23 0441  HGB 11.9* 10.8* 10.5* 12.6* 10.4*  MCV  --  82.6 82.4  --  82.2    Cardiac Enzymes: No results for input(s): "CKTOTAL", "CKMB", "CKMBINDEX", "TROPONINI" in the last 168 hours. CBG: Recent Labs  Lab 04/01/23 1518 04/01/23 1917 04/01/23 2319 04/02/23 0309 04/02/23 0711  GLUCAP 133* 125* 155* 188* 163*    Iron Studies: No results for input(s): "IRON", "TIBC", "TRANSFERRIN", "FERRITIN" in the last 72 hours. Studies/Results: CARDIAC CATHETERIZATION  Result Date: 04/01/2023 Findings: RA = 17 RV = 83/17 PA = 77/25 (42) PCW = 12 Fick cardiac output/index = 3.3/1.5 Thermo CO/CI = 3.6/1.6 PVR = 9 WU FA sat = 99% PA sat = 35%, 35% PAPI = 3.0 Assessment: 1. Cardiogenic shock with moderate to severe PAH and end-stage RV failure Plan/Discussion: Will start milrinone to see if we can low pulmonary pressures and increase output to facilitate  extubation. Triple lumen place to help with access and enable monitoring of CVP and co-ox. Arvilla Meres, MD 6:10 PM  DG Abd Portable 1V  Result Date: 04/01/2023 CLINICAL DATA:  Ileus EXAM: PORTABLE ABDOMEN - 1 VIEW COMPARISON:  CT scan 03/29/2023.  X-ray 03/31/2023. FINDINGS: Diffuse gaseous distention of small bowel and colon noted in the abdomen and pelvis, stable to minimally decreased since yesterday's x-ray. NG tube tip is potentially transpyloric into the proximal duodenum. Rim calcified right renal cyst noted over the right upper iliac bone is seen on recent CT scan. IMPRESSION: 1. Diffuse gaseous distention of small bowel and colon, stable to minimally decreased since yesterday's x-ray. 2. NG tube tip is potentially transpyloric into the proximal duodenum. Electronically Signed   By: Kennith Center M.D.   On: 04/01/2023 13:18    Medications: Infusions:   prismasol BGK 4/2.5 400 mL/hr at 04/02/23 0415    prismasol BGK 4/2.5 400 mL/hr at 04/02/23 0414   sodium chloride     ceFEPime (MAXIPIME) IV Stopped (04/02/23 0900)   feeding supplement (PIVOT 1.5 CAL) 55 mL/hr at 04/02/23 1000   fentaNYL infusion INTRAVENOUS Stopped (04/02/23 0903)   heparin 1,700 Units/hr (04/02/23 1000)   milrinone 0.125 mcg/kg/min (04/02/23 1000)   norepinephrine (LEVOPHED) Adult infusion 5 mcg/min (04/02/23 1000)   prismasol BGK 4/2.5 1,500 mL/hr at 04/02/23 1610   vasopressin 0.04 Units/min (04/02/23 1000)    Scheduled Medications:  calcitRIOL  0.75 mcg Per Tube Q T,Th,Sat-1800   Chlorhexidine Gluconate Cloth  6 each Topical Daily   docusate  100 mg Per Tube BID   feeding supplement (PROSource TF20)  60 mL Per Tube BID   insulin aspart  0-15 Units Subcutaneous Q4H   mouth rinse  15 mL Mouth Rinse Q2H   pantoprazole (PROTONIX) IV  40 mg Intravenous Daily   polyethylene glycol  17 g Per Tube Daily    have reviewed scheduled and prn medications.  Physical Exam: General: Ill looking male, intubated,  sedated. Heart:RRR, s1s2 nl Lungs: Coarse breath sound bilateral. Abdomen:soft, Non-tender, non-distended Extremities: Trace peripheral edema Dialysis Access: L AVF.  RIJ temp hD cath placed on 9/21.  Alajah Witman Prasad Mateo Overbeck 04/02/2023,10:47 AM  LOS: 4 days

## 2023-04-02 NOTE — Progress Notes (Signed)
ANTICOAGULATION CONSULT NOTE  Pharmacy Consult for Heparin Indication: ACS vs VTE  No Known Allergies  Patient Measurements: Height: 5\' 11"  (180.3 cm) Weight: 108.5 kg (239 lb 3.2 oz) IBW/kg (Calculated) : 75.3 Heparin Dosing Weight: 100 kg  Vital Signs: Temp: 97.7 F (36.5 C) (09/25 0324) Temp Source: Axillary (09/25 0324) BP: 139/70 (09/24 1752) Pulse Rate: 59 (09/25 0530)  Labs: Recent Labs    03/30/23 0555 03/30/23 1200 03/31/23 0224 03/31/23 1604 04/01/23 0417 04/01/23 1542 04/01/23 1718 04/01/23 2325 04/02/23 0441 04/02/23 0454  HGB  --    < > 10.8*  --  10.5*  --  12.6*  --  10.4*  --   HCT  --    < > 30.4*  --  30.8*  --  37.0*  --  29.6*  --   PLT  --   --  167  --  156  --   --   --  125*  --   APTT  --    < > 75*  --  57* 82*  --  116* 74*  --   HEPARINUNFRC  --    < > 0.21*  --  0.17* 0.28*  --   --   --  0.24*  CREATININE  --    < > 4.31*   < > 2.80* 2.42*  --   --   --  2.26*  TROPONINIHS 1,601*  --   --   --   --   --   --   --   --   --    < > = values in this interval not displayed.    Estimated Creatinine Clearance: 46.3 mL/min (A) (by C-G formula based on SCr of 2.26 mg/dL (H)).   Assessment: 82 YOM presenting with shock. Significant cardiac history including MSSA endocarditis, mitral regurgitation, aortic stenosis. Also hx of ESRD and DVT. Presented with elevated troponins and ECHO showed moderate RV dysfunction and RV enlargement. Pharmacy consulted to dose heparin to rule out PE with potential ACS. No AC PTA  aPTT 74 - therapeutic   Goal of Therapy:  Heparin level 0.3-0.7 units/ml aPTT 66-102 seconds Monitor platelets by anticoagulation protocol: Yes   Plan:  Continue heparin infusion at 1700 units/hr Continue to use aPTT to guide dosing w/ elevated Tbili, f/u Tbili levels Check aPTT daily while on heparin Continue to monitor H&H and platelets   Thank you for allowing pharmacy to be a part of this patient's care.  Calton Dach, PharmD, BCCCP Clinical Pharmacist 04/02/2023 5:47 AM

## 2023-04-03 ENCOUNTER — Inpatient Hospital Stay (HOSPITAL_COMMUNITY): Payer: Medicare Other

## 2023-04-03 DIAGNOSIS — I5031 Acute diastolic (congestive) heart failure: Secondary | ICD-10-CM

## 2023-04-03 DIAGNOSIS — I350 Nonrheumatic aortic (valve) stenosis: Secondary | ICD-10-CM | POA: Diagnosis not present

## 2023-04-03 DIAGNOSIS — I50810 Right heart failure, unspecified: Secondary | ICD-10-CM | POA: Diagnosis not present

## 2023-04-03 DIAGNOSIS — R570 Cardiogenic shock: Secondary | ICD-10-CM | POA: Diagnosis not present

## 2023-04-03 DIAGNOSIS — R579 Shock, unspecified: Secondary | ICD-10-CM | POA: Diagnosis not present

## 2023-04-03 DIAGNOSIS — J9601 Acute respiratory failure with hypoxia: Secondary | ICD-10-CM | POA: Diagnosis not present

## 2023-04-03 LAB — GLUCOSE, CAPILLARY
Glucose-Capillary: 141 mg/dL — ABNORMAL HIGH (ref 70–99)
Glucose-Capillary: 155 mg/dL — ABNORMAL HIGH (ref 70–99)
Glucose-Capillary: 168 mg/dL — ABNORMAL HIGH (ref 70–99)
Glucose-Capillary: 195 mg/dL — ABNORMAL HIGH (ref 70–99)
Glucose-Capillary: 208 mg/dL — ABNORMAL HIGH (ref 70–99)
Glucose-Capillary: 214 mg/dL — ABNORMAL HIGH (ref 70–99)

## 2023-04-03 LAB — COOXEMETRY PANEL
Carboxyhemoglobin: 2.7 % — ABNORMAL HIGH (ref 0.5–1.5)
Methemoglobin: 1 % (ref 0.0–1.5)
O2 Saturation: 67.5 %
Total hemoglobin: 11.3 g/dL — ABNORMAL LOW (ref 12.0–16.0)

## 2023-04-03 LAB — ECHOCARDIOGRAM LIMITED
Height: 71 in
S' Lateral: 3.8 cm
Weight: 3806.02 oz

## 2023-04-03 LAB — RENAL FUNCTION PANEL
Albumin: 1.7 g/dL — ABNORMAL LOW (ref 3.5–5.0)
Albumin: 1.9 g/dL — ABNORMAL LOW (ref 3.5–5.0)
Anion gap: 8 (ref 5–15)
Anion gap: 15 (ref 5–15)
BUN: 27 mg/dL — ABNORMAL HIGH (ref 6–20)
BUN: 29 mg/dL — ABNORMAL HIGH (ref 6–20)
CO2: 23 mmol/L (ref 22–32)
CO2: 21 mmol/L — ABNORMAL LOW (ref 22–32)
Calcium: 7.8 mg/dL — ABNORMAL LOW (ref 8.9–10.3)
Calcium: 8.1 mg/dL — ABNORMAL LOW (ref 8.9–10.3)
Chloride: 104 mmol/L (ref 98–111)
Chloride: 99 mmol/L (ref 98–111)
Creatinine, Ser: 2.07 mg/dL — ABNORMAL HIGH (ref 0.61–1.24)
Creatinine, Ser: 1.97 mg/dL — ABNORMAL HIGH (ref 0.61–1.24)
GFR, Estimated: 37 mL/min — ABNORMAL LOW (ref >60)
GFR, Estimated: 39 mL/min — ABNORMAL LOW (ref >60)
Glucose, Bld: 169 mg/dL — ABNORMAL HIGH (ref 70–99)
Glucose, Bld: 225 mg/dL — ABNORMAL HIGH (ref 70–99)
Phosphorus: 2.6 mg/dL (ref 2.5–4.6)
Phosphorus: 2.3 mg/dL — ABNORMAL LOW (ref 2.5–4.6)
Potassium: 3.2 mmol/L — ABNORMAL LOW (ref 3.5–5.1)
Potassium: 3.8 mmol/L (ref 3.5–5.1)
Sodium: 135 mmol/L (ref 135–145)
Sodium: 135 mmol/L (ref 135–145)

## 2023-04-03 LAB — HEPATIC FUNCTION PANEL
ALT: 276 U/L — ABNORMAL HIGH (ref 0–44)
AST: 209 U/L — ABNORMAL HIGH (ref 15–41)
Albumin: 2.1 g/dL — ABNORMAL LOW (ref 3.5–5.0)
Alkaline Phosphatase: 167 U/L — ABNORMAL HIGH (ref 38–126)
Bilirubin, Direct: 9.8 mg/dL — ABNORMAL HIGH (ref 0.0–0.2)
Indirect Bilirubin: 6.7 mg/dL — ABNORMAL HIGH (ref 0.3–0.9)
Total Bilirubin: 16.5 mg/dL — ABNORMAL HIGH (ref 0.3–1.2)
Total Protein: 7.8 g/dL (ref 6.5–8.1)

## 2023-04-03 LAB — CULTURE, BLOOD (ROUTINE X 2)
Culture: NO GROWTH
Culture: NO GROWTH
Special Requests: ADEQUATE
Special Requests: ADEQUATE

## 2023-04-03 LAB — APTT
aPTT: 62 seconds — ABNORMAL HIGH (ref 24–36)
aPTT: 55 seconds — ABNORMAL HIGH (ref 24–36)

## 2023-04-03 LAB — CBC
HCT: 31 % — ABNORMAL LOW (ref 39.0–52.0)
Hemoglobin: 10.7 g/dL — ABNORMAL LOW (ref 13.0–17.0)
MCH: 28.1 pg (ref 26.0–34.0)
MCHC: 34.5 g/dL (ref 30.0–36.0)
MCV: 81.4 fL (ref 80.0–100.0)
Platelets: 121 10*3/uL — ABNORMAL LOW (ref 150–400)
RBC: 3.81 MIL/uL — ABNORMAL LOW (ref 4.22–5.81)
RDW: 17.2 % — ABNORMAL HIGH (ref 11.5–15.5)
WBC: 20.2 10*3/uL — ABNORMAL HIGH (ref 4.0–10.5)
nRBC: 8.6 % — ABNORMAL HIGH (ref 0.0–0.2)

## 2023-04-03 LAB — HEPARIN LEVEL (UNFRACTIONATED): Heparin Unfractionated: 0.16 IU/mL — ABNORMAL LOW (ref 0.30–0.70)

## 2023-04-03 LAB — MAGNESIUM: Magnesium: 2.5 mg/dL — ABNORMAL HIGH (ref 1.7–2.4)

## 2023-04-03 MED ORDER — FENTANYL CITRATE PF 50 MCG/ML IJ SOSY
PREFILLED_SYRINGE | INTRAMUSCULAR | Status: AC
  Filled 2023-04-03: qty 1

## 2023-04-03 MED ORDER — SENNOSIDES 8.8 MG/5ML PO SYRP
10.0000 mL | ORAL_SOLUTION | Freq: Two times a day (BID) | ORAL | Status: DC
  Administered 2023-04-03: 10 mL
  Filled 2023-04-03: qty 10

## 2023-04-03 MED ORDER — SILDENAFIL CITRATE 20 MG PO TABS
20.0000 mg | ORAL_TABLET | Freq: Three times a day (TID) | ORAL | Status: DC
  Filled 2023-04-03 (×2): qty 1

## 2023-04-03 MED ORDER — PIVOT 1.5 CAL PO LIQD
1000.0000 mL | ORAL | Status: DC
  Filled 2023-04-03 (×9): qty 1000

## 2023-04-03 MED ORDER — RENA-VITE PO TABS
1.0000 | ORAL_TABLET | Freq: Every day | ORAL | Status: DC
  Administered 2023-04-03 – 2023-04-08 (×3): 1
  Filled 2023-04-03 (×3): qty 1

## 2023-04-03 MED ORDER — ALBUMIN HUMAN 25 % IV SOLN
25.0000 g | Freq: Once | INTRAVENOUS | Status: AC
  Administered 2023-04-03: 25 g via INTRAVENOUS
  Filled 2023-04-03: qty 100

## 2023-04-03 MED ORDER — POTASSIUM CHLORIDE 10 MEQ/50ML IV SOLN
10.0000 meq | INTRAVENOUS | Status: AC
  Administered 2023-04-03 (×3): 10 meq via INTRAVENOUS
  Filled 2023-04-03 (×3): qty 50

## 2023-04-03 MED ORDER — FENTANYL CITRATE PF 50 MCG/ML IJ SOSY
50.0000 ug | PREFILLED_SYRINGE | INTRAMUSCULAR | Status: DC | PRN
  Administered 2023-04-03 – 2023-04-04 (×3): 50 ug via INTRAVENOUS
  Filled 2023-04-03 (×3): qty 1

## 2023-04-03 MED ORDER — POLYETHYLENE GLYCOL 3350 17 G PO PACK
17.0000 g | PACK | Freq: Two times a day (BID) | ORAL | Status: DC
  Administered 2023-04-03: 17 g
  Filled 2023-04-03: qty 1

## 2023-04-03 NOTE — Progress Notes (Signed)
[name] NEPHROLOGY PROGRESS NOTE  Assessment/ Plan: Pt is a 57 y.o. yo male   [facility] HD: [facility] (on hd since 2005)  4.5hr  450/1.5    119kg  2/2 bath  AVF LUA   Heparin 5000 - last OP HD 9/19, post wt 118.4kg  - rocaltrol 0.75 mcg po tts - sensipar 150 mg po tts - no esa (last Hb 11.6 on 9/19)  # Shock septic vs cardiogenic - on IV abx and pressor support per PCCM.  # Severe AS / RV dysfunction/V tach: Status post TEE on 9/23 with no vegetation however so severe AAS, severe RV systolic dysfunction with elevated pulmonary pressure.  RHC on 9/24 with cardiogenic shock, moderate to severe PAH and end-stage RV failure.  Started milrinone by CHF team.  # ESRD - [facility]. Had not missed HD. CRRT started 9/21.  Potassium bath 4K because of hypokalemia.  Judicious UF because of right ventricular failure.  Try UF 50-100 cc/per hour if tolerated by BP.  # Hyperkalemia initially which was improved with CRRT and now adjusting potassium bath for hypokalemia.  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  Resume binders when he starts eating.  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  No vegetation and TEE.  # VDRF: Likely extubating soon.  Subjective: Seen and examined in ICU.  He was off of sedation and little agitated.  Running CRRT okay with UF around 50-100 cc an hour.  He is still on Levophed 7 mics, vasopressin and milrinone.  Remains intubated.   Discussed with ICU nurse. Objective Vital signs in last 24 hours: Vitals:   04/03/23 0900 04/03/23 0915 04/03/23 0930 04/03/23 0945  BP:      Pulse: 94 93 98 96  Resp: (!) 31 (!) 33 (!) 29 (!) 29  Temp:      TempSrc:      SpO2: 98% 98% 97% 98%  Weight:      Height:       Weight change: -0.6 kg  Intake/Output Summary (Last 24 hours) at 04/03/2023 1029 Last data filed at 04/03/2023 1000 Gross per 24 hour  Intake 2981.82 ml  Output 3554.5 ml   Net -572.68 ml       Labs: RENAL PANEL Recent Labs  Lab 03/31/23 1604 04/01/23 0417 04/01/23 1542 04/01/23 1718 04/02/23 0441 04/02/23 0454 04/02/23 1740 04/03/23 0459  NA 134* 131* 136 138  --  132* 135 135  K 3.8 3.5 3.4* 3.5  --  2.8* 3.2* 3.2*  CL 98 97* 97*  --   --  99 103 104  CO2 19* 19* 21*  --   --  22 23 23   GLUCOSE 155* 221* 145*  --   --  181* 167* 169*  BUN 35* 35* 33*  --   --  33* 26* 27*  CREATININE 3.34* 2.80* 2.42*  --   --  2.26* 2.10* 2.07*  CALCIUM 7.1* 7.1* 7.7*  --   --  7.4* 7.4* 7.8*  MG 2.4 2.6* 2.6*  --  2.6*  --   --  2.5*  PHOS 5.0*  4.9* 3.7 3.1  --  2.7 2.7 2.5 2.6  ALBUMIN 1.8* 1.8*  1.8* 1.8*  --   --  1.7* 1.7* 1.7*    Liver Function Tests: Recent Labs  Lab 03/29/23 1013 03/30/23 0312 04/01/23 0417 04/01/23 1542 04/02/23 0454 04/02/23  1740 04/03/23 0459  AST 445*  --  1,061*  --   --   --   --   ALT 96*  --  550*  --   --   --   --   ALKPHOS 165*  --  155*  --   --   --   --   BILITOT 8.6*  --  9.1*  --   --   --   --   PROT 7.6  --  7.2  --   --   --   --   ALBUMIN 2.2*   < > 1.8*  1.8*   < > 1.7* 1.7* 1.7*   < > = values in this interval not displayed.   Recent Labs  Lab 03/29/23 1158  LIPASE 41  AMYLASE 71   No results for input(s): "AMMONIA" in the last 168 hours. CBC: Recent Labs    03/31/23 0224 04/01/23 0417 04/01/23 1718 04/02/23 0441 04/03/23 0459  HGB 10.8* 10.5* 12.6* 10.4* 10.7*  MCV 82.6 82.4  --  82.2 81.4    Cardiac Enzymes: No results for input(s): "CKTOTAL", "CKMB", "CKMBINDEX", "TROPONINI" in the last 168 hours. CBG: Recent Labs  Lab 04/02/23 1530 04/02/23 1917 04/02/23 2307 04/03/23 0317 04/03/23 0736  GLUCAP 159* 155* 156* 168* 155*    Iron Studies: No results for input(s): "IRON", "TIBC", "TRANSFERRIN", "FERRITIN" in the last 72 hours. Studies/Results: CARDIAC CATHETERIZATION  Result Date: 04/01/2023 Findings: RA = 17 RV = 83/17 PA = 77/25 (42) PCW = 12 Fick cardiac  output/index = 3.3/1.5 Thermo CO/CI = 3.6/1.6 PVR = 9 WU FA sat = 99% PA sat = 35%, 35% PAPI = 3.0 Assessment: 1. Cardiogenic shock with moderate to severe PAH and end-stage RV failure Plan/Discussion: Will start milrinone to see if we can low pulmonary pressures and increase output to facilitate extubation. Triple lumen place to help with access and enable monitoring of CVP and co-ox. Arvilla Meres, MD 6:10 PM   Medications: Infusions:   prismasol BGK 4/2.5 400 mL/hr at 04/03/23 0545    prismasol BGK 4/2.5 400 mL/hr at 04/03/23 0545   sodium chloride     ceFEPime (MAXIPIME) IV Stopped (04/03/23 0828)   feeding supplement (PIVOT 1.5 CAL) 55 mL/hr at 04/03/23 1000   fentaNYL infusion INTRAVENOUS Stopped (04/03/23 0641)   heparin 1,900 Units/hr (04/03/23 1000)   milrinone 0.125 mcg/kg/min (04/03/23 1000)   norepinephrine (LEVOPHED) Adult infusion 7 mcg/min (04/03/23 1000)   potassium chloride 50 mL/hr at 04/03/23 1000   prismasol BGK 4/2.5 1,500 mL/hr at 04/03/23 0906   vasopressin 0.04 Units/min (04/03/23 1000)    Scheduled Medications:  calcitRIOL  0.75 mcg Per Tube Q T,Th,Sat-1800   Chlorhexidine Gluconate Cloth  6 each Topical Daily   docusate  100 mg Per Tube BID   feeding supplement (PROSource TF20)  60 mL Per Tube BID   insulin aspart  0-15 Units Subcutaneous Q4H   mouth rinse  15 mL Mouth Rinse Q2H   pantoprazole (PROTONIX) IV  40 mg Intravenous Daily   polyethylene glycol  17 g Per Tube Daily   sennosides  10 mL Per Tube QHS    have reviewed scheduled and prn medications.  Physical Exam: General: Ill looking male, intubated, sedated. Heart:RRR, s1s2 nl Lungs: Coarse breath sound bilateral. Abdomen:soft, Non-tender, non-distended Extremities: Trace peripheral edema Dialysis Access: L AVF.  RIJ temp hD cath placed on 9/21.   Lun Muro Prasad Evyn Kooyman 04/03/2023,10:29 AM  LOS: 5 days

## 2023-04-03 NOTE — Progress Notes (Signed)
Pharmacy ICU Bowel Regimen Consult Note   Current Inpatient Medications for Bowel Management:  Docusate 100 mg BID  Miralax 17g daily  Sennosides 10 mL at bedtime  Assessment: Timothy Palmer is a 57 y.o. year old male admitted on 03/29/2023. Constipation identified as acute opioid-induced constipation . Bowel regimen assessment completed by Autumn (nurse name) on 9/26 (date). LBM PTA.  []  Bowel sounds present  []  No abdominal tenderness  [x]  Passing gas   Plan: Increase sennosides to 10 mL BID  Increase miralax to 17g BID   MD contacted (if needed):  Thank you for allowing pharmacy to participate in this patient's care.  Griffin Dakin 04/03/2023,2:15 PM

## 2023-04-03 NOTE — Consult Note (Addendum)
Advanced Heart Failure Team Consult Note   Primary Physician: Annie Sable, MD PCP-Cardiologist:  Little Ishikawa, MD  Reason for Consultation: Aortic stenosis / pulmonary hypertension  HPI:    Timothy Palmer is seen today for evaluation of aortic stenosis/pulmonary hypertension at the request of Dr. Denese Killings, PCCM.   Timothy Palmer is a 57 y.o. AAM with ESRD on HD since 2005, DVT RUE, PAD, septic emboli and HTN. 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   Admitted to [facility] 9/15 with increased dyspnea. He presented with fever and dyspnea with evidence for CHF.  He was found to have severe Timothy (valve perforation) and MV endocarditis. He had minimally invasive MVR. 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 He had left popliteal embolectomy followed by above to below knee popliteal bypass.   Admitted with recurrent MSSA bacteremia and endocarditis in 10/16. Started on IV vanc for 6 weeks   Timothy. Palmer presented to the ED via EMS due to hypotension.  He was getting ready for HD and had a fall.  CBG 30s, SBP 70s on arrival to ED. K 6.7, iCa 0.72, HsTrop 1143>1293>1354>1.6K. Lactic acid 11.9, AST/ALT elevated. He had been experiencing diarrhea for a couple of days and felt weaker as the days went on. Initially conversant in the ED. Nephrology saw and started patient on CRRT. He required pressors on CRRT initiation. He then went into acute respiratory failure requiring intubation. Tele showed wide completed tachycardia> DCCV x1 > NSR. K 6 and prolonged QTc 612 ms. K corrected. Echo 9/24:  60-65%, mild LVH, G1DD, RV mod reduced, estimated RVSP , LA mildly dilated, RA mod dilated, MV s/p repair, trivial Timothy, severe calcification of AV, severe aortic valve stenosis. RHC 9/24: Cardiogenic shock with moderate to severe PAH and end-stage RV failure.  Has been tolerating CRRT on norepi @11 , milrinone 0.125, and vaso 0.04. AHF team to see for aortic stenosis with possible TAVR and pulmonary hypertension.  Hx obtained from chart review and his sister at bedside as patient is intubated.   Cardiac studies reviewed: RHC 9/24: Cardiogenic shock with moderate to severe PAH and end-stage RV failure. RA 17, PA 77/25 (42), Fick CO/CI 3.3/1.5, TD CO/CI 3.6/1.6. PVR 9 WU, PAPi 3. PAO2 35% TEE: EF 55-60%, RV severely reduced, no thrombus seen, mild Timothy, mild-mod TR, severe aortic valve stenosis, no vegetations seen Echo 9/24:  60-65%, mild LVH, G1DD, RV mod reduced, estimated RVSP , LA mildly dilated, RA mod dilated, MV s/p repair, trivial Timothy, severe calcification of AV, severe aortic valve stenosis.  Echo 2016: EF 50-55%.  TEE 04/13/15: EF 50%. Mitral valve vegetation noted => small, mobile.  Does not appear to be compromising valve currently.  Trivial Timothy.   Home Medications Prior to Admission medications   Medication Sig Start Date End Date Taking? Authorizing Provider  lisinopril (PRINIVIL,ZESTRIL) 20 MG tablet Take 20 mg by mouth daily.   Yes [provider]  multivitamin (RENA-VIT) TABS tablet Take 1 tablet by mouth at bedtime. Patient taking differently: Take 1 tablet by mouth 3 (three) times a week. 04/14/15  Yes Hongalgi, Maximino Greenland, MD  sevelamer carbonate (RENVELA) 800 MG tablet Take 1,600 mg by mouth daily.   Yes [provider]  Nutritional Supplements (FEEDING SUPPLEMENT, NEPRO CARB STEADY,) LIQD Take 237 mLs by mouth 2 (two) times daily between meals. 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 The aneurysm measures 1.4 x 1.1 cm which is  significantly larger than the 4.5 mm parent vessel.    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    Past Surgical History: Past Surgical History:  Procedure Laterality Date   AV FISTULA PLACEMENT Left ?2005   forearm    BASCILIC VEIN TRANSPOSITION Left 12/27/2015   Procedure: FIRST STAGE BASILIC VEIN TRANSPOSITION;  Surgeon: Fransisco Hertz, MD;  Location[facility] OR;  Service: Vascular;  Laterality: Left;   BASCILIC VEIN TRANSPOSITION Left 02/12/2016   Procedure: SECOND STAGE BASILIC VEIN TRANSPOSITION;  Surgeon: Fransisco Hertz, MD;  Location: [facility] OR;  Service: Vascular;  Laterality: Left;   CENTRAL LINE INSERTION  04/01/2023   Procedure: CENTRAL LINE INSERTION;  Surgeon: Dolores Patty, MD;  Location: [facility] INVASIVE CV LAB;  Service: Cardiovascular;;   EMBOLECTOMY Left 03/25/2014   Procedure: EMBOLECTOMY left popliteal;  Surgeon: Larina Earthly, MD;  Location[facility] OR;  Service: Vascular;  Laterality:  Left;   FEMORAL-POPLITEAL BYPASS GRAFT Left 03/25/2014   Procedure: Left Femoral- Below Knee Popliteal Bypass Graft;  Surgeon: Larina Earthly, MD;  Location: [facility] OR;  Service: Vascular;  Laterality: Left;   INTRAOPERATIVE TRANSESOPHAGEAL ECHOCARDIOGRAM N/A 04/05/2014   Procedure: INTRAOPERATIVE TRANSESOPHAGEAL ECHOCARDIOGRAM;  Surgeon: Purcell Nails, MD;  Location[facility] OR;  Service: Open Heart Surgery;  Laterality: N/A;   LEFT AND RIGHT HEART CATHETERIZATION WITH CORONARY ANGIOGRAM N/A 03/28/2014   Procedure: LEFT AND RIGHT HEART CATHETERIZATION WITH CORONARY ANGIOGRAM;  Surgeon: Laurey Morale, MD;  Location: [facility] CATH LAB;  Service: Cardiovascular;  Laterality: N/A;   MITRAL VALVE REPAIR Right 04/05/2014   Procedure: MINIMALLY INVASIVE MITRAL VALVE REPAIR (MVR);  Surgeon: Purcell Nails, MD;  Location: [facility] OR;  Service: Open Heart Surgery;  Laterality: Right;   MITRAL VALVE REPLACEMENT Right 04/05/2014   Procedure: Bring back MINIMALLY INVASIVE MITRAL VALVE (MV) REPLACEMENT - Reexploration for bleeding;  Surgeon: Purcell Nails, MD;  Location: [facility] OR;  Service: Open Heart Surgery;  Laterality: Right;   PARATHYROIDECTOMY N/A 03/22/2016   Procedure: PARATHYROIDECTOMY AUTOTRANSPLANT;  Surgeon: Darnell Level, MD;  Location: [facility] OR;  Service: General;  Laterality: N/A;  PATCH ANGIOPLASTY Left 07/23/2013   Procedure: PATCH ANGIOPLASTY- LEFT RADIOCEPHALIC ARTERIOVENOUS FISTULA;  Surgeon: Chuck Hint, MD;  Location: [facility] OR;  Service: Vascular;  Laterality: Left;   REVISON OF ARTERIOVENOUS FISTULA Left 11/04/2019   Procedure: REVISON OF LEFT ARM ARTERIOVENOUS FISTULA;  Surgeon: Larina Earthly, MD;  Location: [facility] OR;  Service: Vascular;  Laterality: Left;   RIGHT HEART CATH N/A 04/01/2023   Procedure: RIGHT HEART CATH;  Surgeon: Dolores Patty, MD;  Location: [facility] INVASIVE CV LAB;  Service: Cardiovascular;  Laterality: N/A;   SHUNTOGRAM Left November 04, 2011   TEE WITHOUT CARDIOVERSION N/A 03/24/2014    Procedure: TRANSESOPHAGEAL ECHOCARDIOGRAM (TEE);  Surgeon: Laurey Morale, MD;  Location: [facility] ENDOSCOPY;  Service: Cardiovascular;  Laterality: N/A;   TEE WITHOUT CARDIOVERSION N/A 04/12/2015   Procedure: TRANSESOPHAGEAL ECHOCARDIOGRAM (TEE);  Surgeon: Laurey Morale, MD;  Location: [facility] ENDOSCOPY;  Service: Cardiovascular;  Laterality: N/A;   TEE WITHOUT CARDIOVERSION N/A 08/18/2015   Procedure: TRANSESOPHAGEAL ECHOCARDIOGRAM (TEE);  Surgeon: Laurey Morale, MD;  Location: [facility] ENDOSCOPY;  Service: Cardiovascular;  Laterality: N/A;    Family History: Family History  Problem Relation Age of Onset   Diabetes Mother    Hypertension Mother    Hypertension Father    Hypertension Sister    Hypertension Brother    Social History: Social History   Socioeconomic History   Marital status: Single    Spouse name: Not on file   Number of children: Not on file   Years of education: Not on file   Highest education level: Not on file  Occupational History   Not on file  Tobacco Use   Smoking status: Never   Smokeless tobacco: Never  Vaping Use   Vaping status: Never Used  Substance and Sexual Activity   Alcohol use: Yes    Alcohol/week: 0.0 standard drinks of alcohol    Comment: occasional   Drug use: Yes    Frequency: 7.0 times per week    Types: Marijuana    Comment: last use 2.9.17 @ 1900   Sexual activity: Never  Other Topics Concern   Not on file  Social History Narrative   Not on file   Social Determinants of Health   Financial Resource Strain: Not on file  Food Insecurity: Not on file  Transportation Needs: Not on file  Physical Activity: Not on file  Stress: Not on file  Social Connections: Not on file   Allergies:  No Known Allergies  Objective:    Vital Signs:   Temp:  [97.5 F (36.4 C)-100.4 F (38 C)] 100.4 F (38 C) (09/26 1133) Pulse Rate:  [70-130] 92 (09/26 1140) Resp:  [13-34] 25 (09/26 1140) SpO2:  [92 %-100 %] 98 % (09/26 1140) Arterial Line BP:  (92-127)/(34-58) 92/39 (09/26 1140) FiO2 (%):  [30 %] 30 % (09/26 1140) Weight:  [107.9 kg] 107.9 kg (09/26 0500) Last BM Date :  (pta)  Weight change: Filed Weights   04/01/23 0128 04/02/23 0314 04/03/23 0500  Weight: 108.5 kg 108.5 kg 107.9 kg    Intake/Output:   Intake/Output Summary (Last 24 hours) at 04/03/2023 1203 Last data filed at 04/03/2023 1100 Gross per 24 hour  Intake 2916.3 ml  Output 3431.5 ml  Net -515.2 ml    Physical Exam   General:  intubated and sedated HEENT: +ETT. + OG Neck: supple. JVD elevated. Carotids 2+ bilat; no bruits. No lymphadenopathy or thyromegaly appreciated. LIJ CVC Cor: PMI nondisplaced. Regular rate & rhythm.  No rubs, gallops. +Timothy murmur. Lungs: diminshed Abdomen: obese, soft, nontender, nondistended. No hepatosplenomegaly. No bruits or masses. Good bowel sounds. Extremities: no cyanosis, clubbing, rash, + BLE edema. LFA fistula. R fem a line Neuro: intermittently follows commands Telemetry   NSR 90s-100s (Personally reviewed)    EKG    NSR 1st degree AVB, incomplete RBBB 80s  Labs   Basic Metabolic Panel: Recent Labs  Lab 03/31/23 1604 04/01/23 0417 04/01/23 1542 04/01/23 1718 04/02/23 0441 04/02/23 0454 04/02/23 1740 04/03/23 0459  NA 134* 131* 136 138  --  132* 135 135  K 3.8 3.5 3.4* 3.5  --  2.8* 3.2* 3.2*  CL 98 97* 97*  --   --  99 103 104  CO2 19* 19* 21*  --   --  22 23 23   GLUCOSE 155* 221* 145*  --   --  181* 167* 169*  BUN 35* 35* 33*  --   --  33* 26* 27*  CREATININE 3.34* 2.80* 2.42*  --   --  2.26* 2.10* 2.07*  CALCIUM 7.1* 7.1* 7.7*  --   --  7.4* 7.4* 7.8*  MG 2.4 2.6* 2.6*  --  2.6*  --   --  2.5*  PHOS 5.0*  4.9* 3.7 3.1  --  2.7 2.7 2.5 2.6    Liver Function Tests: Recent Labs  Lab 03/29/23 1013 03/30/23 0312 04/01/23 0417 04/01/23 1542 04/02/23 0454 04/02/23 1740 04/03/23 0459  AST 445*  --  1,061*  --   --   --   --   ALT 96*  --  550*  --   --   --   --   ALKPHOS 165*  --  155*  --    --   --   --   BILITOT 8.6*  --  9.1*  --   --   --   --   PROT 7.6  --  7.2  --   --   --   --   ALBUMIN 2.2*   < > 1.8*  1.8* 1.8* 1.7* 1.7* 1.7*   < > = values in this interval not displayed.   Recent Labs  Lab 03/29/23 1158  LIPASE 41  AMYLASE 71   No results for input(s): "AMMONIA" in the last 168 hours.  CBC: Recent Labs  Lab 03/29/23 0800 03/29/23 1118 03/30/23 0134 03/30/23 0359 03/31/23 0224 04/01/23 0417 04/01/23 1718 04/02/23 0441 04/03/23 0459  WBC 22.6*   < > 22.2*  --  19.6* 18.6*  --  17.1* 20.2*  NEUTROABS 20.0*  --   --   --   --   --   --   --   --   HGB 12.2*   < > 11.6*   < > 10.8* 10.5* 12.6* 10.4* 10.7*  HCT 38.4*   < > 32.7*   < > 30.4* 30.8* 37.0* 29.6* 31.0*  MCV 88.9   < > 81.8  --  82.6 82.4  --  82.2 81.4  PLT 236   < > 233  --  167 156  --  125* 121*   < > = values in this interval not displayed.    Cardiac Enzymes: No results for input(s): "CKTOTAL", "CKMB", "CKMBINDEX", "TROPONINI" in the last 168 hours.  BNP: BNP (last 3 results) Recent Labs    03/29/23 1158  BNP 2,670.6*    ProBNP (last 3 results) No results for input(s): "PROBNP" in the last 8760 hours.   CBG:  Recent Labs  Lab 04/02/23 1917 04/02/23 2307 04/03/23 0317 04/03/23 0736 04/03/23 1132  GLUCAP 155* 156* 168* 155* 141*    Coagulation Studies: No results for input(s): "LABPROT", "INR" in the last 72 hours.   Imaging   No results found.   Medications:     Current Medications:  calcitRIOL  0.75 mcg Per Tube Q T,Th,Sat-1800   Chlorhexidine Gluconate Cloth  6 each Topical Daily   docusate  100 mg Per Tube BID   feeding supplement (PROSource TF20)  60 mL Per Tube BID   insulin aspart  0-15 Units Subcutaneous Q4H   mouth rinse  15 mL Mouth Rinse Q2H   pantoprazole (PROTONIX) IV  40 mg Intravenous Daily   polyethylene glycol  17 g Per Tube Daily   sennosides  10 mL Per Tube QHS   sildenafil  20 mg Per Tube TID    Infusions:   prismasol BGK 4/2.5  400 mL/hr at 04/03/23 0545    prismasol BGK 4/2.5 400 mL/hr at 04/03/23 0545   sodium chloride     ceFEPime (MAXIPIME) IV Stopped (04/03/23 0828)   feeding supplement (PIVOT 1.5 CAL) 55 mL/hr at 04/03/23 1100   fentaNYL infusion INTRAVENOUS Stopped (04/03/23 0641)   heparin 1,900 Units/hr (04/03/23 1100)   milrinone 0.125 mcg/kg/min (04/03/23 1100)   norepinephrine (LEVOPHED) Adult infusion 11 mcg/min (04/03/23 1100)   prismasol BGK 4/2.5 1,500 mL/hr at 04/03/23 0906   vasopressin 0.04 Units/min (04/03/23 1100)      Patient Profile   Timothy Palmer is a 57 y.o. AAM with ESRD on HD since 2005, DVT RUE, PAD, septic emboli and HTN. MV endocarditis with perforation S/P minimally invasive MVR 04/2014 and replacement 2/2 MSSA endocarditis (2016) .  AHF to see for aortic stenosis/pulmonary hypertension.   Assessment/Plan  Acute on chronic combined systolic and diastolic heart failure>>cardiogenic shock - Lactic acid 11.9 on admission, now down trending. Last 2 03/31/23 - RHC 9/24: Cardiogenic shock with moderate to severe PAH and end-stage RV failure. RA 17, PA 77/25 (42), Fick CO/CI 3.3/1.5, TD CO/CI 3.6/1.6. PVR 9 WU, PAPi 3. PAO2 35% - Now on milrinone 0.125, norepi @ 11, and vaso 0.04. Co-ox 68% - CVP 22, on CRRT - GDMT limited with ESRD, consider SGLT2i? - Not a candidate for advanced therapies with co morbidities (ESRD, PAH) - Strict I&O, daily weights  Aortic stenosis - TEE with severe aortic stenosis - Dr. Gala Romney discussed with Dr.Thukkani, may be a candidate for TAVR - needs better volume optimization - avoid hypotension  End stage RV failure - RHC moderate to severe PAH and end-stage RV failure  - now on milrinone - CVP goal ~10, wouldn't go lower than that with RV failure  ESRD on iHD (T/Th/Sat) - nephrology following, now on CRRT - Reports compliance at home with HD - avoid hypotension  Pulmonary arterial hypertension - Suspect who group 1 - Plan to trial sildenafil  20 mg TID +/- iNO - severe PAH on cath 9/24  WCT - WCT on admission suspect secondary to electrolyte imbalances/wide QTc -Keep K > 4 & Mg > 2 -In NSR  AFL - noted this admission, may need amio - suspect 2/2 milrinone? - Now back in NSR - A/C if reoccurrence  Hx MVR - valve stable, mild Timothy on TEE   Length of Stay: 5  Alen Bleacher, NP  04/03/2023, 12:03 PM  Advanced Heart Failure Team Pager [phone] (M-F; 7a - 5p)  Please contact [facility] for night-coverage  after hours (4p -7a ) and weekends on [email or web address]   Agree with above  57 y/o male with ESRD, severe AS and PAH.  Admitted with respiratory failure.  RHC showed severe PAH with low output. Now on milrinone. Co-ox improved but having trouble liberating from vent.   Echo EF 60-65% severe RV dysfunction. Severe AS  At baseline relatively independent according to his sister  General:  ill-appearing. Sedated on vent HEENT: +ETT Neck: supple. JVP up Carotids 2+ bilat; + bruits. ZOX:[id number] rate & rhythm. + AS Lungs: coarse Abdomen: soft, nontender, nondistended. No hepatosplenomegaly. No bruits or masses. Good bowel sounds. Extremities: no cyanosis, clubbing, rash, 1+ edema Neuro: intubated sedated  He has severe PAH and cor pulmonale as well as severe AS in setting of ESRD. Given relatively good functional capacity at baseline will start inhaled NO and repeat RHC tomorrow to see if we can drop PA pressures or if they are fixed. If PA pressures modifiable then will try combination therapy and see if we can stabilize him and eventually consider TAVR.   D/w Drs. Thukkani and Agarwala.   Marland KitchenCRITICAL CARE Performed by: Arvilla Meres  Total critical care time: 55 minutes  Critical care time was exclusive of separately billable procedures and treating other patients.  Critical care was necessary to treat or prevent imminent or life-threatening deterioration.  Critical care was time spent personally by me  (independent of midlevel providers or residents) on the following activities: development of treatment plan with patient and/or surrogate as well as nursing, discussions with consultants, evaluation of patient's response to treatment, examination of patient, obtaining history from patient or surrogate, ordering and performing treatments and interventions, ordering and review of laboratory studies, ordering and review of radiographic studies, pulse oximetry and re-evaluation of patient's condition.  Arvilla Meres, MD  4:58 PM

## 2023-04-03 NOTE — Progress Notes (Signed)
ANTICOAGULATION CONSULT NOTE  Pharmacy Consult for Heparin Indication: ACS vs VTE  No Known Allergies  Patient Measurements: Height: 5\' 11"  (180.3 cm) Weight: 107.9 kg (237 lb 14 oz) IBW/kg (Calculated) : 75.3 Heparin Dosing Weight: 100 kg  Vital Signs: Temp: 100.1 F (37.8 C) (09/26 0740) Temp Source: Axillary (09/26 0740) Pulse Rate: 130 (09/26 0700)  Labs: Recent Labs    04/01/23 0417 04/01/23 1542 04/01/23 1718 04/01/23 2325 04/02/23 0441 04/02/23 0454 04/02/23 1740 04/03/23 0459  HGB 10.5*  --  12.6*  --  10.4*  --   --  10.7*  HCT 30.8*  --  37.0*  --  29.6*  --   --  31.0*  PLT 156  --   --   --  125*  --   --  121*  APTT 57* 82*  --  116* 74*  --   --  62*  HEPARINUNFRC 0.17* 0.28*  --   --   --  0.24*  --  0.16*  CREATININE 2.80* 2.42*  --   --   --  2.26* 2.10* 2.07*    Estimated Creatinine Clearance: 50.4 mL/min (A) (by C-G formula based on SCr of 2.07 mg/dL (H)).   Assessment: 19 YOM presenting with shock. Significant cardiac history including MSSA endocarditis, mitral regurgitation, aortic stenosis. Also hx of ESRD and DVT. Presented with elevated troponins and ECHO showed moderate RV dysfunction and RV enlargement. Pharmacy consulted to dose heparin to rule out PE with potential ACS. No AC PTA  aPTT subtherapeutic at 62, no issues with infusion or signs of bleeding per RN. CBC stable.  Goal of Therapy:  Heparin level 0.3-0.7 units/ml aPTT 66-102 seconds Monitor platelets by anticoagulation protocol: Yes   Plan:  Increase heparin infusion to 1900 units/hr Continue to use aPTT to guide dosing w/ elevated Tbili, f/u Tbili levels 8 hour aPTT level  Continue to monitor H&H and platelets Continue to monitor for s/sx of bleeding   Thank you for involving pharmacy in the patient's care.   Theotis Burrow, PharmD PGY1 Acute Care Pharmacy Resident  04/03/2023 7:48 AM

## 2023-04-03 NOTE — Plan of Care (Signed)
  Problem: Nutrition: Goal: Adequate nutrition will be maintained Outcome: Progressing   Problem: Coping: Goal: Level of anxiety will decrease Outcome: Not Progressing   Problem: Elimination: Goal: Will not experience complications related to bowel motility Outcome: Not Progressing   Problem: Safety: Goal: Non-violent Restraint(s) Outcome: Not Progressing

## 2023-04-03 NOTE — Progress Notes (Addendum)
ANTICOAGULATION CONSULT NOTE  Pharmacy Consult for Heparin Indication: ACS vs VTE  No Known Allergies  Patient Measurements: Height: 5\' 11"  (180.3 cm) Weight: 107.9 kg (237 lb 14 oz) IBW/kg (Calculated) : 75.3 Heparin Dosing Weight: 100 kg  Vital Signs: Temp: 100 F (37.8 C) (09/26 1605) Temp Source: Axillary (09/26 1605) Pulse Rate: 90 (09/26 1630)  Labs: Recent Labs    04/01/23 0417 04/01/23 1542 04/01/23 1718 04/01/23 2325 04/02/23 0441 04/02/23 0454 04/02/23 1740 04/03/23 0459 04/03/23 1555  HGB 10.5*  --  12.6*  --  10.4*  --   --  10.7*  --   HCT 30.8*  --  37.0*  --  29.6*  --   --  31.0*  --   PLT 156  --   --   --  125*  --   --  121*  --   APTT 57* 82*  --    < > 74*  --   --  62* 55*  HEPARINUNFRC 0.17* 0.28*  --   --   --  0.24*  --  0.16*  --   CREATININE 2.80* 2.42*  --   --   --  2.26* 2.10* 2.07* 1.97*   < > = values in this interval not displayed.    Estimated Creatinine Clearance: 52.9 mL/min (A) (by C-G formula based on SCr of 1.97 mg/dL (H)).   Assessment: 70 YOM presenting with shock. Significant cardiac history including MSSA endocarditis, mitral regurgitation, aortic stenosis. Also hx of ESRD and DVT. Presented with elevated troponins and ECHO showed moderate RV dysfunction and RV enlargement. Pharmacy consulted to dose heparin to rule out PE with potential ACS. No AC PTA  aPTT subtherapeutic at 55 despite recent rate increase. Confirmed with RN, no issues with infusion or signs of bleeding per RN. CBC stable.   Goal of Therapy:  Heparin level 0.3-0.7 units/ml aPTT 66-102 seconds Monitor platelets by anticoagulation protocol: Yes   Plan:  Increase heparin infusion at 2100 units/hr Check aPTT in 8 hours and daily while on heparin Continue to monitor H&H and platelets  Thank you for allowing pharmacy to be a part of this patient's care.  Thelma Barge, PharmD Clinical Pharmacist

## 2023-04-03 NOTE — Progress Notes (Signed)
  Echocardiogram 2D Echocardiogram has been performed.  Maren Reamer 04/03/2023, 4:35 PM

## 2023-04-03 NOTE — Progress Notes (Signed)
Nitric started at 40 ppm per Md's order.

## 2023-04-03 NOTE — Progress Notes (Addendum)
Rounding Note    Patient Name: Timothy Palmer Date of Encounter: 04/03/2023  [facility] Cardiologist: Little Ishikawa, MD   Subjective   Net negative 1L yesterday on CRRT. Intubated, following commands this morning.  On levophed at 90mcg/min and vasopressin and milrinone 0.125.  FiO2 40%, PEEP 5.  Inpatient Medications    Scheduled Meds:  calcitRIOL  0.75 mcg Per Tube Q T,Th,Sat-1800   Chlorhexidine Gluconate Cloth  6 each Topical Daily   docusate  100 mg Per Tube BID   feeding supplement (PROSource TF20)  60 mL Per Tube BID   insulin aspart  0-15 Units Subcutaneous Q4H   mouth rinse  15 mL Mouth Rinse Q2H   pantoprazole (PROTONIX) IV  40 mg Intravenous Daily   polyethylene glycol  17 g Per Tube Daily   sennosides  10 mL Per Tube QHS   Continuous Infusions:   prismasol BGK 4/2.5 400 mL/hr at 04/03/23 0545    prismasol BGK 4/2.5 400 mL/hr at 04/03/23 0545   sodium chloride     ceFEPime (MAXIPIME) IV 200 mL/hr at 04/03/23 0800   feeding supplement (PIVOT 1.5 CAL) 55 mL/hr at 04/03/23 0800   fentaNYL infusion INTRAVENOUS Stopped (04/03/23 0641)   heparin 1,900 Units/hr (04/03/23 0800)   milrinone 0.125 mcg/kg/min (04/03/23 0800)   norepinephrine (LEVOPHED) Adult infusion 11 mcg/min (04/03/23 0800)   potassium chloride Stopped (04/03/23 0743)   prismasol BGK 4/2.5 1,500 mL/hr at 04/03/23 0546   vasopressin 0.04 Units/min (04/03/23 0800)   PRN Meds: fentaNYL, Gerhardt's butt cream, heparin, lip balm, midazolam, mouth rinse   Vital Signs    Vitals:   04/03/23 0630 04/03/23 0645 04/03/23 0700 04/03/23 0740  BP:      Pulse: (!) 129 (!) 129 (!) 130   Resp: (!) 23 (!) 23 (!) 23   Temp:    100.1 F (37.8 C)  TempSrc:    Axillary  SpO2: 97% 96% 97%   Weight:      Height:        Intake/Output Summary (Last 24 hours) at 04/03/2023 0826 Last data filed at 04/03/2023 0800 Gross per 24 hour  Intake 2676.44 ml  Output 3566.5 ml  Net -890.06 ml       04/03/2023    5:00 AM 04/02/2023    3:14 AM 04/01/2023    1:28 AM  Last 3 Weights  Weight (lbs) 237 lb 14 oz 239 lb 3.2 oz 239 lb 3.2 oz  Weight (kg) 107.9 kg 108.5 kg 108.5 kg      Telemetry    NSR currently in 90s; had tachyarrhythmia x 1 hour this morning with rate 130, suspect AFL- Personally Reviewed  ECG    Sinus, incomplete right bundle branch block, right axis deviation, diffuse T wave inversions, QTc 560- Personally Reviewed  Physical Exam   GEN: intubated, sedated  Neck: No JVD appreciated Cardiac: RRR, 3/6 systolic murmur Respiratory: Clear to auscultation bilaterally in anterior fields GI: Soft, nontender, non-distended  MS: No edema Neuro:  following commands Psych: Unable to assess  Labs    High Sensitivity Troponin:   Recent Labs  Lab 03/29/23 1912 03/29/23 2214 03/29/23 2352 03/30/23 0134 03/30/23 0555  TROPONINIHS 1,425* 1,143* 1,293* 1,254* 1,601*     Chemistry Recent Labs  Lab 03/29/23 1013 03/29/23 1118 04/01/23 0417 04/01/23 1542 04/01/23 1718 04/02/23 0441 04/02/23 0454 04/02/23 1740 04/03/23 0459  NA 135   < > 131* 136   < >  --  132*  135 135  K 5.9*   < > 3.5 3.4*   < >  --  2.8* 3.2* 3.2*  CL 89*   < > 97* 97*  --   --  99 103 104  CO2 17*   < > 19* 21*  --   --  22 23 23   GLUCOSE 64*   < > 221* 145*  --   --  181* 167* 169*  BUN 70*   < > 35* 33*  --   --  33* 26* 27*  CREATININE 10.07*   < > 2.80* 2.42*  --   --  2.26* 2.10* 2.07*  CALCIUM 7.0*   < > 7.1* 7.7*  --   --  7.4* 7.4* 7.8*  MG 2.6*   < > 2.6* 2.6*  --  2.6*  --   --  2.5*  PROT 7.6  --  7.2  --   --   --   --   --   --   ALBUMIN 2.2*   < > 1.8*  1.8* 1.8*  --   --  1.7* 1.7* 1.7*  AST 445*  --  1,061*  --   --   --   --   --   --   ALT 96*  --  550*  --   --   --   --   --   --   ALKPHOS 165*  --  155*  --   --   --   --   --   --   BILITOT 8.6*  --  9.1*  --   --   --   --   --   --   GFRNONAA 6*   < > 26* 31*  --   --  33* 36* 37*  ANIONGAP 29*   < > 15 18*  --    --  11 9 8    < > = values in this interval not displayed.    Lipids No results for input(s): "CHOL", "TRIG", "HDL", "LABVLDL", "LDLCALC", "CHOLHDL" in the last 168 hours.  Hematology Recent Labs  Lab 04/01/23 0417 04/01/23 1718 04/02/23 0441 04/03/23 0459  WBC 18.6*  --  17.1* 20.2*  RBC 3.74*  --  3.60* 3.81*  HGB 10.5* 12.6* 10.4* 10.7*  HCT 30.8* 37.0* 29.6* 31.0*  MCV 82.4  --  82.2 81.4  MCH 28.1  --  28.9 28.1  MCHC 34.1  --  35.1 34.5  RDW 16.5*  --  16.2* 17.2*  PLT 156  --  125* 121*   Thyroid No results for input(s): "TSH", "FREET4" in the last 168 hours.  BNP Recent Labs  Lab 03/29/23 1158  BNP 2,670.6*    DDimer No results for input(s): "DDIMER" in the last 168 hours.   Radiology    CARDIAC CATHETERIZATION  Result Date: 04/01/2023 Findings: RA = 17 RV = 83/17 PA = 77/25 (42) PCW = 12 Fick cardiac output/index = 3.3/1.5 Thermo CO/CI = 3.6/1.6 PVR = 9 WU FA sat = 99% PA sat = 35%, 35% PAPI = 3.0 Assessment: 1. Cardiogenic shock with moderate to severe PAH and end-stage RV failure Plan/Discussion: Will start milrinone to see if we can low pulmonary pressures and increase output to facilitate extubation. Triple lumen place to help with access and enable monitoring of CVP and co-ox. Arvilla Meres, MD 6:10 PM  DG Abd Portable 1V  Result Date: 04/01/2023 CLINICAL DATA:  Ileus EXAM: PORTABLE ABDOMEN -  1 VIEW COMPARISON:  CT scan 03/29/2023.  X-ray 03/31/2023. FINDINGS: Diffuse gaseous distention of small bowel and colon noted in the abdomen and pelvis, stable to minimally decreased since yesterday's x-ray. NG tube tip is potentially transpyloric into the proximal duodenum. Rim calcified right renal cyst noted over the right upper iliac bone is seen on recent CT scan. IMPRESSION: 1. Diffuse gaseous distention of small bowel and colon, stable to minimally decreased since yesterday's x-ray. 2. NG tube tip is potentially transpyloric into the proximal duodenum.  Electronically Signed   By: Kennith Center M.D.   On: 04/01/2023 13:18    Cardiac Studies     Patient Profile     57 y.o. 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  Assessment & Plan   Shock: Lactate 12 on admission.  Significantly elevated procalcitonin.  Blood cultures NGTD. Cardiogenic versus septic.  Echocardiogram 9/21 showed EF 60 to 65%, grade 1 diastolic dysfunction, moderate RV dysfunction, severe RV enlargement, severe pulmonary hypertension (RVSP 73), status post mitral valve repair, mean gradient 4 mmHg across mitral valve, severe aortic stenosis (mean gradient 33, V-max 4.0 m/s, AVA 0.5 cm, DI 0.23), could not rule out AV vegetation.  TEE 9/23 showed no vegetation, normal LV function, severe RV dysfunction, severe aortic stenosis, severe pulmonary hypertension.  RHC on 924 showed findings consistent with cardiogenic shock with end-stage RV failure, moderate to severe PAH (RA 17, RV 83/17, PA 77/25/42, PCWP 12, CI 1.5, PA sat 35%).  Degree of pulmonary hypertension suggests chronic RV failure -Started on milrinone for RV failure.  Coox significantly improved, 68% this morning.  Continue milrinone -Continue to monitor Co-ox and CVP.  Goal CVP 10-15, will need to be cautious about dropping filling pressures too low in setting of severe RV failure and severe aortic stenosis.  -Blood cultures no growth to date.  Abx per primary team  Aortic stenosis: Severe AS on echo, will need evaluation for TAVR pending clinical course, though with suspected end stage RV failure/severe PH with normal LV filling pressures on RHC, unclear if would benefit from TAVR as unlikely to help with RV failure.  Will need further discussion with AHF and structural team as he recovers  VT: Required cardioversion for VT on 9/21.  Suspected due to hyperkalemia (K 6.9) and  prolonged QTc (612).  Now on CRRT, potassium improved.  QTc 596 on EKG yesterday.  Calcium low, received repletion.  Repeat ECG 9/24 shows improvement with Qtc 560.  Repeat EKG today shows Qtc 460  AFL: tachycardia this AM with rate 130bpm, suspect 2:1 AFL.  Qtc is improved, 460 this morning, can start amio if needed for further tachyarrythmias  AHRF: remains intubated.  Per PCCM   CRITICAL CARE TIME: I have spent a total of 40 minutes with patient reviewing hospital notes, telemetry, EKGs, labs and examining the patient as well as establishing an assessment and plan.    Discussed with Dr Denese Killings. > 50% of time was spent in direct patient care. The patient is critically ill with multi-organ system failure and requires high complexity decision making for assessment and support, frequent evaluation and titration of therapies, application of advanced monitoring technologies and extensive interpretation of multiple databases.   For questions or updates, please contact [facility] Please consult [email or web address] for contact info under        Signed, Little Ishikawa, MD  04/03/2023, 8:26 AM

## 2023-04-03 NOTE — Progress Notes (Addendum)
eLink Physician-Brief Progress Note Patient Name: MARRION LESER DOB: Sep 21, 1965 MRN: [id number]   Date of Service  04/03/2023  HPI/Events of Note  Patient with soft blood pressures and sinus tachycardia. K+ 3.2.  eICU Interventions  Albumin 25 % 25 gm IV x 1 ordered. KCL 10 meq IV Q 1 hour x 3 ordered.        Migdalia Dk 04/03/2023, 6:23 AM

## 2023-04-03 NOTE — TOC Initial Note (Signed)
Transition of Care [facility]) - Initial/Assessment Note    Patient Details  Name: Timothy Palmer MRN: [id number] Date of Birth: [birth year]  [facility] CM/SW Contact:    Nicanor Bake Phone Number: [phone] 04/03/2023, 3:56 PM  Clinical Narrative:  HF CSW spoke with pts sister over the phone. Pts sister stated that the pt lives alone in a single family home. Pts sister stated that to her knowledge the pt has no history of HH services. Pts sister stated that he does not use any equipment. Pts sister is unsure if the pt has a scale at home. CSW explained that a follow up hospital appointment will be scheduled closer to dc.   TOC will continue following.                  Expected Discharge Plan: Home/Self Care Barriers to Discharge: Continued Medical Work up   Patient Goals and CMS Choice            Expected Discharge Plan and Services       Living arrangements for the past 2 months: Single Family Home                                      Prior Living Arrangements/Services Living arrangements for the past 2 months: Single Family Home Lives with:: Self Patient language and need for interpreter reviewed:: Yes Do you feel safe going back to the place where you live?: Yes      Need for Family Participation in Patient Care: No (Comment) Care giver support system in place?: Yes (comment)   Criminal Activity/Legal Involvement Pertinent to Current Situation/Hospitalization: No - Comment as needed  Activities of Daily Living      Permission Sought/Granted                  Emotional Assessment Appearance:: Appears stated age Attitude/Demeanor/Rapport: Unable to Assess Affect (typically observed): Unable to Assess   Alcohol / Substance Use: Not Applicable Psych Involvement: No (comment)  Admission diagnosis:  Lactic acidosis [E87.20] Shock (HCC) [R57.9] Hypoglycemia [E16.2] Hypotension [I95.9] Diarrhea, unspecified type  [R19.7] Patient Active Problem List   Diagnosis Date Noted   VT (ventricular tachycardia) (HCC) 04/02/2023   Acute respiratory failure with hypoxia (HCC) 03/31/2023   Right heart failure (HCC) 03/30/2023   Hypotension 03/29/2023   Shock (HCC) 03/29/2023   Lactic acidosis 03/29/2023   Severe aortic stenosis 03/29/2023   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   Hyperparathyroidism, secondary (HCC) 03/21/2016   Lung nodules    ESRD on dialysis (HCC)    Mediastinal adenopathy    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   Renal mass    Cavitary lesion of lung 04/06/2015   Mitral valve replaced 04/06/2015   Anticoagulant long-term use 04/06/2015   Anemia in chronic kidney disease 04/06/2015   Coagulopathy (HCC) 04/06/2015   Multiple pulmonary nodules    Endocarditis 04/05/2015   Weakness of left lower extremity 11/14/2014   Encounter for therapeutic drug monitoring 06/16/2014   Paroxysmal atrial fibrillation (HCC) 06/13/2014   S/P minimally invasive mitral valve repair 04/05/2014   Septic embolism to left lower extremity 03/25/2014   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   Chronic diastolic congestive heart failure (HCC)    Mitral valve vegetation 03/23/2014   Severe mitral regurgitation 03/23/2014   Hypertension 03/22/2014  Acute bronchitis 03/22/2014   T wave inversion in EKG 03/22/2014   QT prolongation 03/22/2014   Acute combined systolic and diastolic congestive heart failure (HCC) 03/21/2014   Bacterial endocarditis - MSSA positive blood cultures with mitral valve vegetation, severe mitral regurgitation, and septic embolization 03/21/2014   Knee pain, left 03/21/2014   DVT of upper extremity (deep vein thrombosis) (HCC) 03/04/2014   Positive blood culture 11/27/2013   End stage renal disease (HCC) 06/30/2013   PCP:  Annie Sable, MD Pharmacy:   [facility] [phone] -  [place] Otto, [place] - [address] AT [facility] [address] [place] [address] Phone: [phone] Fax: [phone]     Social Determinants of Health (SDOH) Social History: SDOH Screenings   Tobacco Use: Low Risk  (03/29/2023)   SDOH Interventions:     Readmission Risk Interventions     No data to display

## 2023-04-03 NOTE — Plan of Care (Signed)

## 2023-04-03 NOTE — Progress Notes (Addendum)
Nutrition Follow-up  DOCUMENTATION CODES:  Obesity unspecified  INTERVENTION:  Continue tube feeding via OGT, increase rate for elevated needs: Pivot 1.5 at 65 ml/h (1560 ml per day) Provides 2340 kcal, 146 gm protein, 1170 ml free water daily Renal multivitamin daily Monitor BM status and pressor requirements  NUTRITION DIAGNOSIS:  Inadequate oral intake related to inability to eat as evidenced by NPO status. - remains applicable   GOAL:  Patient will meet greater than or equal to 90% of their needs - progressing, being met with TF at goal  MONITOR:  Vent status, Labs, Weight trends, TF tolerance  REASON FOR ASSESSMENT:  Consult Assessment of nutrition requirement/status  ASSESSMENT:  57 year old male with PMH of ESRD ( T, Th, Sat) , Systolic and diastolic CHF, HTN, Mycotic aneurysm, and PVD. Presented 9/21 with history of 4 days of diarrhea, poor po intake and fatigue with weakness and body aches. Admitted for shock with acute respiratory failure.   9/21 - admitted, intubated, CRRT initiated 9/23 - TF initiated, TEE showed severe AS, severe  RV systolic dysfunction, and severely elevated pulmonary pressures 9/24 - right heart cath showing severe right heart failure   Patient is currently intubated on ventilator support. CRRT in progress. OGT in place terminating in the proximal duodenum per imaging. TF infusing at goal of 55. Still requiring pressor support x 2 and rate of levo increased over the last 24 hours.  Family at bedside able to provide some nutrition hx. States that at baseline, appetite is not great particularly on days he has HD. Reports that he likely only eats 1 adequate meal a day. Has not noticed any significant weight changes but does reports that his legs are less swollen today then earlier in admission.  Discussed in rounds, no plans to extubate today. Still no BM, RPH increasing bowel regimen. Noted that pt in need of TAVR if a candidate.  MV: 14.2  L/min Temp (24hrs), Avg:99 F (37.2 C), Min:97.5 F (36.4 C), Max:100.4 F (38 C)   Intake/Output Summary (Last 24 hours) at 04/03/2023 1325 Last data filed at 04/03/2023 1300 Gross per 24 hour  Intake 3019.75 ml  Output 3661.5 ml  Net -641.75 ml  Net IO Since Admission: -2,344.07 mL [04/03/23 1325]  Admit weight: 114.1 kg  Current weight: 107.9 kg  Nutritionally Relevant Medications: Scheduled Meds:  calcitRIOL  0.75 mcg Per Tube Q T,Th,Sat-1800   docusate  100 mg Per Tube BID   PROSource TF20  60 mL Per Tube BID   insulin aspart  0-15 Units Subcutaneous Q4H   pantoprazole   40 mg Intravenous Daily   polyethylene glycol  17 g Per Tube Daily   sennosides  10 mL Per Tube QHS   Continuous Infusions:  ceFEPime (MAXIPIME) IV Stopped (04/03/23 0828)   feeding supplement (PIVOT 1.5 CAL) 55 mL/hr at 04/03/23 1200   norepinephrine (LEVOPHED) Adult infusion 16 mcg/min (04/03/23 1200)   vasopressin 0.04 Units/min (04/03/23 1200)   Labs Reviewed: K 3.2 BUN 27, creatinine 2.07 Mg 2.5 CBG ranges from 141-169 mg/dL over the last 24 hours HgbA1c 5.7%  NUTRITION - FOCUSED PHYSICAL EXAM: Flowsheet Row Most Recent Value  Orbital Region No depletion  Upper Arm Region No depletion  Thoracic and Lumbar Region No depletion  Buccal Region Unable to assess  Temple Region No depletion  Clavicle Bone Region No depletion  Clavicle and Acromion Bone Region No depletion  Scapular Bone Region Unable to assess  Dorsal Hand No depletion  Patellar  Region No depletion  Anterior Thigh Region No depletion  Posterior Calf Region No depletion  Edema (RD Assessment) Mild  Hair Reviewed  Eyes Unable to assess  Mouth Unable to assess  Skin Reviewed  Nails Reviewed    Diet Order:   Diet Order             Diet NPO time specified  Diet effective now                   EDUCATION NEEDS:  Not appropriate for education at this time  Skin:  Skin Assessment: Reviewed RN Assessment  Last  BM:  PTA  Height:  Ht Readings from Last 1 Encounters:  03/29/23 5\' 11"  (1.803 m)    Weight:  Wt Readings from Last 1 Encounters:  04/03/23 107.9 kg    Ideal Body Weight:  78.18 kg  BMI:  Body mass index is 33.18 kg/m.  Estimated Nutritional Needs:  Kcal:  2300-2600 kcal/d Protein:  130-150 g/d Fluid:  2.3L/d    Greig Castilla, RD, LDN Clinical Dietitian RD pager # available in AMION  After hours/weekend pager # available in [facility]

## 2023-04-03 NOTE — Progress Notes (Signed)
NAME:  Timothy Palmer, MRN:  [id number], DOB:  Apr 04, 1966, LOS: 5 ADMISSION DATE:  03/29/2023, CONSULTATION DATE:  03/29/2023 REFERRING MD: EDP, CHIEF COMPLAINT: Shock  History of Present Illness:  57 year old male patient with history of ESRD ( T, Th, Sat) , Systolic and diastolic CHF, HTN, Mitral valve repair ( 2015) and replacement 2/2 MSSA  endocarditis(2016), Mycotic aneurysm, Upper extremity DVT(2015) and PVD. Presented to the Ed 9/21 with history or 4 days of diarrhea which resolved 9/20, with poor po intake and fatigue with weakness and body aches. In the ED he had Lactate of 11.9, Troponin of 900, bedside echo by ED MD showed decreased EF. Last echo 2016 showed EF of 50-55%. He is not followed by cardiology. He is not on blood thinners as they caused his HD graph to bleed. PCCM were notified by ED MD  and asked to admit the patient and manage care .  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   Significant Hospital Events: Including procedures, antibiotic start and stop dates in addition to other pertinent events   03/29/2023 Admission  - Stated he is weak and tired, has no energy.Currently on 2 L [place] with sats of 100%,HD is due today,  9/21> echo with severe RV enlargement and pulmonary hypertension severe arctic stenosis that is calcified.  Nephrology started CRRT due to shock.  Patient went on pressors.  HD cath placed.  Later intubated.  Also had had wide-complex rhythm agonal respirations but appears did not lose any pulses.  Received cardioversion received stat hyperkalemia treatment.  Status post arterial line insertion 09/24: Heart cath showing severe right heart failure  Interim History / Subjective:   Overnight events: Patient did have softer blood pressures and was given albumin and potassium.  Overnight did have tachyarrhythmia that was potentially a flutter for about 1 hour.  Resolved on its own.  Patient evaluated bedside this morning.  Unable to follow any commands.  Discussed with patient's sister, Britta Mccreedy at bedside about patient's prior to presentation life and patient's sister reported that patient was pretty active and would like to go places.  He is able to manage his own finances as well as go grocery shopping.  He is able to cook himself food as well.  The only function limiting aspect of patient's life was a shortness of breath associated with his heart.  Objective   Blood pressure 139/70, pulse 92, temperature (!) 100.4 F (38 C), temperature source Axillary, resp. rate (!) 25, height 5\' 11"  (1.803 m), weight 107.9 kg, SpO2 98%.  Vent Mode: PRVC FiO2 (%):  [30 %] 30 % Set Rate:  [20 bmp] 20 bmp Vt Set:  [600 mL] 600 mL PEEP:  [5 cmH20] 5 cmH20 Plateau Pressure:  [23 cmH20-29 cmH20] 23 cmH20   Intake/Output Summary (Last 24 hours) at 04/03/2023 1150 Last data filed at 04/03/2023 1100 Gross per 24 hour  Intake 3016.33 ml  Output 3591.5 ml  Net -575.17 ml   Filed Weights   04/01/23 0128 04/02/23 0314 04/03/23 0500  Weight: 108.5 kg 108.5 kg 107.9 kg    Examination: General: Acute distress, intubated,  HENT: Normocephalic, atraumatic, ET tube in place Lungs: Clear to auscultation  bilaterally Cardiovascular: Regular rate, 3/6 systolic murmur appreciated throughout, no gallops Abdomen: Soft, nontender, nontender, normal active bowel sounds Extremities: Trace edema bilaterally to lower extremities Neuro: Intubated, not able to follow any instructions GU: On CRRT  Resolved Hospital Problem list     Assessment & Plan:   #Combined shock with cardiogenic and septic #Severe aortic valve stenosis Patient is having increased COOX and is measuring well.  Patient still having increased vasopressor requirements with Levophed up to 11.  Patient is not spiking any fevers, but white count is increasing up to 20 today.  Patient is on cefepime.  Can only be missing anaerobic coverage for MRSA coverage.  Continue to wake patient up today to see if patient can tolerate SBT and try to extubate.  Likely need goals of care conversation.  CVP goal 10-15. -Continue cefepime day 6 of 7 -Continue inotropic support with milrinone per cardiology -Continue Levophed and vasopressin -MAP goal greater than 65 -Cardiology following, appreciate recommendations -Once hemodynamically stable, can evaluate for TAVR -Continue heparin -Continue serial COOX measurements -Patient was pretty active prior to this event, and the only function limiting concern was his heart, but otherwise mentally patient was pretty sound, and patient is up to move around with no concern for mobility. -Obtain hepatic function panel today -Repeat echocardiogram today  #Acute hypoxemic respiratory failure Patient did not tolerate weaning yesterday.  Will try to wean today. -VAP bundle -Continue cefepime day 6 of 7 -Pulmonary toilet and hygiene -Wean off ventilator and wean sedation  #History of V. Tach #Concern for a flutter Overnight patient did have narrow complex tachycardia consistent with possible a flutter.  His spontaneously resolved.  Patient is currently in sinus rhythm.  Given concern for QT prolongation on  amiodarone.  Now that he is back in sinus rhythm.  Will obtain EKG to evaluate for QT prolongation. Patient has been sinus rhythm.  Has not had any further episodes of ventricular tachycardia.  Currently doing well. -Continue telemetry -Plan per cardiology   #ESRD #Hypokalemia #Hyponatremia Hyponatremia resolved.  Potassium improved, but is still at 3.2.  Will continue to follow-up with nephrology.  Will need to hold off on pulling off of fluid if CVP goes too low.  -Continue CRRT per nephrology  #Hyperglycemia Patient is currently on sliding scale insulin.  Tolerating well.  No acute concerns. -Continue sliding scale insulin  Best Practice (right click and "Reselect all SmartList Selections" daily)   Diet/type: tubefeeds DVT prophylaxis: systemic heparin GI prophylaxis: PPI Lines: Central line Foley:  N/A and Yes, and it is still needed Code Status:  full code  Labs   CBC: Recent Labs  Lab 03/29/23 0800 03/29/23 1118 03/30/23 0134 03/30/23 0359 03/31/23 0224 04/01/23 0417 04/01/23 1718 04/02/23 0441 04/03/23 0459  WBC 22.6*   < > 22.2*  --  19.6* 18.6*  --  17.1* 20.2*  NEUTROABS 20.0*  --   --   --   --   --   --   --   --   HGB 12.2*   < > 11.6*   < > 10.8* 10.5* 12.6* 10.4* 10.7*  HCT 38.4*   < > 32.7*   < > 30.4* 30.8* 37.0* 29.6* 31.0*  MCV 88.9   < >  81.8  --  82.6 82.4  --  82.2 81.4  PLT 236   < > 233  --  167 156  --  125* 121*   < > = values in this interval not displayed.    Basic Metabolic Panel: Recent Labs  Lab 03/31/23 1604 04/01/23 0417 04/01/23 1542 04/01/23 1718 04/02/23 0441 04/02/23 0454 04/02/23 1740 04/03/23 0459  NA 134* 131* 136 138  --  132* 135 135  K 3.8 3.5 3.4* 3.5  --  2.8* 3.2* 3.2*  CL 98 97* 97*  --   --  99 103 104  CO2 19* 19* 21*  --   --  22 23 23   GLUCOSE 155* 221* 145*  --   --  181* 167* 169*  BUN 35* 35* 33*  --   --  33* 26* 27*  CREATININE 3.34* 2.80* 2.42*  --   --  2.26* 2.10* 2.07*  CALCIUM 7.1* 7.1* 7.7*   --   --  7.4* 7.4* 7.8*  MG 2.4 2.6* 2.6*  --  2.6*  --   --  2.5*  PHOS 5.0*  4.9* 3.7 3.1  --  2.7 2.7 2.5 2.6   GFR: Estimated Creatinine Clearance: 50.4 mL/min (A) (by C-G formula based on SCr of 2.07 mg/dL (H)). Recent Labs  Lab 03/29/23 1158 03/29/23 1655 03/29/23 1912 03/29/23 2114 03/30/23 0134 03/31/23 0224 04/01/23 0417 04/02/23 0441 04/03/23 0459  PROCALCITON 47.41  --   --   --   --   --   --   --   --   WBC 22.1*  --  23.6*  --  22.2* 19.6* 18.6* 17.1* 20.2*  LATICACIDVEN  --    < > >9.0* 6.7* 4.8* 2.0*  --   --   --    < > = values in this interval not displayed.    Liver Function Tests: Recent Labs  Lab 03/29/23 1013 03/30/23 0312 04/01/23 0417 04/01/23 1542 04/02/23 0454 04/02/23 1740 04/03/23 0459  AST 445*  --  1,061*  --   --   --   --   ALT 96*  --  550*  --   --   --   --   ALKPHOS 165*  --  155*  --   --   --   --   BILITOT 8.6*  --  9.1*  --   --   --   --   PROT 7.6  --  7.2  --   --   --   --   ALBUMIN 2.2*   < > 1.8*  1.8* 1.8* 1.7* 1.7* 1.7*   < > = values in this interval not displayed.   Recent Labs  Lab 03/29/23 1158  LIPASE 41  AMYLASE 71   No results for input(s): "AMMONIA" in the last 168 hours.  ABG    Component Value Date/Time   PHART 7.452 (H) 03/30/2023 1252   PCO2ART 33.2 03/30/2023 1252   PO2ART 166 (H) 03/30/2023 1252   HCO3 24.9 04/01/2023 1718   TCO2 26 04/01/2023 1718   ACIDBASEDEF 1.0 04/01/2023 1718   O2SAT 67.5 04/03/2023 0459     Coagulation Profile: Recent Labs  Lab 03/29/23 1158  INR 1.8*    Cardiac Enzymes: No results for input(s): "CKTOTAL", "CKMB", "CKMBINDEX", "TROPONINI" in the last 168 hours.  HbA1C: Hgb A1c MFr Bld  Date/Time Value Ref Range Status  03/29/2023 11:58 AM 5.7 (H) 4.8 - 5.6 % Final  Comment:    (NOTE) Pre diabetes:          5.7%-6.4%  Diabetes:              >6.4%  Glycemic control for   <7.0% adults with diabetes   04/05/2014 04:19 AM 6.0 (H) <5.7 % Final     Comment:    (NOTE)                                                                       According to the ADA Clinical Practice Recommendations for 2011, when HbA1c is used as a screening test:  >=6.5%   Diagnostic of Diabetes Mellitus           (if abnormal result is confirmed) 5.7-6.4%   Increased risk of developing Diabetes Mellitus References:Diagnosis and Classification of Diabetes Mellitus,Diabetes Care,2011,34(Suppl 1):S62-S69 and Standards of Medical Care in         Diabetes - 2011,Diabetes Care,2011,34 (Suppl 1):S11-S61.    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, severe mitral regurgitation, and septic embolization (03/21/2014), Chronic diastolic congestive heart failure (HCC), DVT of upper extremity (deep vein thrombosis) (HCC) (03/04/2014), ESRD (end stage renal disease) on dialysis [facility]), Hypertension, 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   Surgical History:   Past Surgical History:  Procedure Laterality Date   AV FISTULA PLACEMENT Left ?2005   forearm    BASCILIC VEIN TRANSPOSITION Left 12/27/2015   Procedure: FIRST STAGE BASILIC VEIN TRANSPOSITION;  Surgeon: Fransisco Hertz, MD;  Location: [facility] OR;  Service: Vascular;  Laterality: Left;   BASCILIC VEIN TRANSPOSITION Left  02/12/2016   Procedure: SECOND STAGE BASILIC VEIN TRANSPOSITION;  Surgeon: Fransisco Hertz, MD;  Location: [facility] OR;  Service: Vascular;  Laterality: Left;   CENTRAL LINE INSERTION  04/01/2023   Procedure: CENTRAL LINE INSERTION;  Surgeon: Dolores Patty, MD;  Location: [facility] INVASIVE CV LAB;  Service: Cardiovascular;;   EMBOLECTOMY Left 03/25/2014   Procedure: EMBOLECTOMY left popliteal;  Surgeon: Larina Earthly, MD;  Location: [facility] OR;  Service: Vascular;  Laterality: Left;   FEMORAL-POPLITEAL BYPASS GRAFT Left 03/25/2014   Procedure: Left Femoral- Below Knee Popliteal Bypass Graft;  Surgeon: Larina Earthly, MD;  Location: [facility] OR;  Service: Vascular;  Laterality: Left;   INTRAOPERATIVE TRANSESOPHAGEAL ECHOCARDIOGRAM N/A 04/05/2014   Procedure: INTRAOPERATIVE TRANSESOPHAGEAL ECHOCARDIOGRAM;  Surgeon: Purcell Nails, MD;  Location: [facility] OR;  Service: Open Heart Surgery;  Laterality: N/A;   LEFT AND RIGHT HEART CATHETERIZATION WITH CORONARY ANGIOGRAM N/A 03/28/2014   Procedure: LEFT AND RIGHT HEART CATHETERIZATION WITH CORONARY ANGIOGRAM;  Surgeon: Laurey Morale, MD;  Location: [facility] CATH LAB;  Service: Cardiovascular;  Laterality: N/A;   MITRAL VALVE REPAIR Right 04/05/2014   Procedure: MINIMALLY INVASIVE MITRAL VALVE REPAIR (MVR);  Surgeon: Purcell Nails, MD;  Location: [facility] OR;  Service: Open Heart Surgery;  Laterality: Right;   MITRAL VALVE REPLACEMENT Right 04/05/2014   Procedure: Bring back MINIMALLY INVASIVE MITRAL VALVE (MV) REPLACEMENT - Reexploration for bleeding;  Surgeon: Purcell Nails, MD;  Location: [facility] OR;  Service: Open Heart Surgery;  Laterality: Right;   PARATHYROIDECTOMY N/A 03/22/2016   Procedure: PARATHYROIDECTOMY AUTOTRANSPLANT;  Surgeon: Darnell Level, MD;  Location: [facility] OR;  Service: General;  Laterality: N/A;   PATCH ANGIOPLASTY Left 07/23/2013   Procedure: PATCH ANGIOPLASTY- LEFT RADIOCEPHALIC ARTERIOVENOUS FISTULA;  Surgeon: Chuck Hint, MD;  Location: [facility] OR;  Service: Vascular;   Laterality: Left;   REVISON OF ARTERIOVENOUS FISTULA Left 11/04/2019   Procedure: REVISON OF LEFT ARM ARTERIOVENOUS FISTULA;  Surgeon: Larina Earthly, MD;  Location: [facility] OR;  Service: Vascular;  Laterality: Left;   RIGHT HEART CATH N/A 04/01/2023   Procedure: RIGHT HEART CATH;  Surgeon: Dolores Patty, MD;  Location: [facility] INVASIVE CV LAB;  Service: Cardiovascular;  Laterality: N/A;   SHUNTOGRAM Left November 04, 2011   TEE WITHOUT CARDIOVERSION N/A 03/24/2014   Procedure: TRANSESOPHAGEAL ECHOCARDIOGRAM (TEE);  Surgeon: Laurey Morale, MD;  Location: [facility] ENDOSCOPY;  Service: Cardiovascular;  Laterality: N/A;   TEE WITHOUT CARDIOVERSION N/A 04/12/2015   Procedure: TRANSESOPHAGEAL ECHOCARDIOGRAM (TEE);  Surgeon: Laurey Morale, MD;  Location: [facility] ENDOSCOPY;  Service: Cardiovascular;  Laterality: N/A;   TEE WITHOUT CARDIOVERSION N/A 08/18/2015   Procedure: TRANSESOPHAGEAL ECHOCARDIOGRAM (TEE);  Surgeon: Laurey Morale, MD;  Location: [facility] ENDOSCOPY;  Service: Cardiovascular;  Laterality: N/A;     Social History:   reports that he has never smoked. He has never used smokeless tobacco. He reports current alcohol use. He reports current drug use. Frequency: 7.00 times per week. Drug: Marijuana.   Family History:  His family history includes Diabetes in his mother; Hypertension in his brother, father, mother, and sister.   Allergies No Known Allergies   Home Medications  Prior to Admission medications   Medication Sig Start Date End Date Taking? Authorizing Provider  lisinopril (PRINIVIL,ZESTRIL) 20 MG tablet Take 20 mg by mouth daily.   Yes [provider]  multivitamin (RENA-VIT) TABS tablet Take 1 tablet by mouth at bedtime. Patient taking differently: Take 1 tablet by mouth 3 (three) times a week. 04/14/15  Yes Hongalgi, Maximino Greenland, MD  sevelamer carbonate (RENVELA) 800 MG tablet Take 1,600 mg by mouth daily.   Yes [provider]  Nutritional Supplements (FEEDING SUPPLEMENT, NEPRO  CARB STEADY,) LIQD Take 237 mLs by mouth 2 (two) times daily between meals. Patient not taking: Reported on 11/04/2019 04/14/15   Elease Etienne, MD     Critical care time: 40 mins     Modena Slater, DO Internal Medicine Resident PGY-2 Pager: [phone]

## 2023-04-04 ENCOUNTER — Inpatient Hospital Stay (HOSPITAL_COMMUNITY): Payer: Medicare Other

## 2023-04-04 ENCOUNTER — Encounter (HOSPITAL_COMMUNITY)
Admission: EM | Disposition: A | Discharge status: Hospice | Payer: Self-pay | Source: Home / Self Care | Attending: Pulmonary Disease

## 2023-04-04 DIAGNOSIS — R579 Shock, unspecified: Secondary | ICD-10-CM | POA: Diagnosis not present

## 2023-04-04 DIAGNOSIS — I5043 Acute on chronic combined systolic (congestive) and diastolic (congestive) heart failure: Secondary | ICD-10-CM

## 2023-04-04 HISTORY — PX: RIGHT HEART CATH: CATH118263

## 2023-04-04 LAB — GLUCOSE, CAPILLARY
Glucose-Capillary: 193 mg/dL — ABNORMAL HIGH (ref 70–99)
Glucose-Capillary: 159 mg/dL — ABNORMAL HIGH (ref 70–99)
Glucose-Capillary: 130 mg/dL — ABNORMAL HIGH (ref 70–99)
Glucose-Capillary: 143 mg/dL — ABNORMAL HIGH (ref 70–99)
Glucose-Capillary: 139 mg/dL — ABNORMAL HIGH (ref 70–99)
Glucose-Capillary: 134 mg/dL — ABNORMAL HIGH (ref 70–99)

## 2023-04-04 LAB — POCT I-STAT 7, (LYTES, BLD GAS, ICA,H+H)
Acid-Base Excess: 1 mmol/L (ref 0.0–2.0)
Acid-Base Excess: 1 mmol/L (ref 0.0–2.0)
Acid-base deficit: 1 mmol/L (ref 0.0–2.0)
Bicarbonate: 26.3 mmol/L (ref 20.0–28.0)
Bicarbonate: 26.4 mmol/L (ref 20.0–28.0)
Bicarbonate: 25.1 mmol/L (ref 20.0–28.0)
Calcium, Ion: 1.09 mmol/L — ABNORMAL LOW (ref 1.15–1.40)
Calcium, Ion: 1.11 mmol/L — ABNORMAL LOW (ref 1.15–1.40)
Calcium, Ion: 1.07 mmol/L — ABNORMAL LOW (ref 1.15–1.40)
HCT: 37 % — ABNORMAL LOW (ref 39.0–52.0)
HCT: 38 % — ABNORMAL LOW (ref 39.0–52.0)
HCT: 37 % — ABNORMAL LOW (ref 39.0–52.0)
Hemoglobin: 12.6 g/dL — ABNORMAL LOW (ref 13.0–17.0)
Hemoglobin: 12.9 g/dL — ABNORMAL LOW (ref 13.0–17.0)
Hemoglobin: 12.6 g/dL — ABNORMAL LOW (ref 13.0–17.0)
O2 Saturation: 70 %
O2 Saturation: 73 %
O2 Saturation: 100 %
Patient temperature: 98.5
Potassium: 4.2 mmol/L (ref 3.5–5.1)
Potassium: 4.3 mmol/L (ref 3.5–5.1)
Potassium: 4.4 mmol/L (ref 3.5–5.1)
Sodium: 138 mmol/L (ref 135–145)
Sodium: 138 mmol/L (ref 135–145)
Sodium: 137 mmol/L (ref 135–145)
TCO2: 28 mmol/L (ref 22–32)
TCO2: 28 mmol/L (ref 22–32)
TCO2: 26 mmol/L (ref 22–32)
pCO2 arterial: 41.9 mm[Hg] (ref 32–48)
pCO2 arterial: 43.6 mm[Hg] (ref 32–48)
pCO2 arterial: 44.8 mm[Hg] (ref 32–48)
pH, Arterial: 7.389 (ref 7.35–7.45)
pH, Arterial: 7.405 (ref 7.35–7.45)
pH, Arterial: 7.357 (ref 7.35–7.45)
pO2, Arterial: 37 mm[Hg] — CL (ref 83–108)
pO2, Arterial: 39 mm[Hg] — CL (ref 83–108)
pO2, Arterial: 191 mm[Hg] — ABNORMAL HIGH (ref 83–108)

## 2023-04-04 LAB — CBC
HCT: 31.8 % — ABNORMAL LOW (ref 39.0–52.0)
Hemoglobin: 11.1 g/dL — ABNORMAL LOW (ref 13.0–17.0)
MCH: 28.3 pg (ref 26.0–34.0)
MCHC: 34.9 g/dL (ref 30.0–36.0)
MCV: 81.1 fL (ref 80.0–100.0)
Platelets: 128 10*3/uL — ABNORMAL LOW (ref 150–400)
RBC: 3.92 MIL/uL — ABNORMAL LOW (ref 4.22–5.81)
RDW: 17.4 % — ABNORMAL HIGH (ref 11.5–15.5)
WBC: 24.9 10*3/uL — ABNORMAL HIGH (ref 4.0–10.5)
nRBC: 7.1 % — ABNORMAL HIGH (ref 0.0–0.2)

## 2023-04-04 LAB — RENAL FUNCTION PANEL
Albumin: 2 g/dL — ABNORMAL LOW (ref 3.5–5.0)
Albumin: 1.8 g/dL — ABNORMAL LOW (ref 3.5–5.0)
Anion gap: 12 (ref 5–15)
Anion gap: 10 (ref 5–15)
BUN: 31 mg/dL — ABNORMAL HIGH (ref 6–20)
BUN: 34 mg/dL — ABNORMAL HIGH (ref 6–20)
CO2: 22 mmol/L (ref 22–32)
CO2: 23 mmol/L (ref 22–32)
Calcium: 8.4 mg/dL — ABNORMAL LOW (ref 8.9–10.3)
Calcium: 8.2 mg/dL — ABNORMAL LOW (ref 8.9–10.3)
Chloride: 98 mmol/L (ref 98–111)
Chloride: 102 mmol/L (ref 98–111)
Creatinine, Ser: 1.24 mg/dL (ref 0.61–1.24)
Creatinine, Ser: 1.81 mg/dL — ABNORMAL HIGH (ref 0.61–1.24)
GFR, Estimated: >60 mL/min (ref >60)
GFR, Estimated: 44 mL/min — ABNORMAL LOW (ref >60)
Glucose, Bld: 235 mg/dL — ABNORMAL HIGH (ref 70–99)
Glucose, Bld: 143 mg/dL — ABNORMAL HIGH (ref 70–99)
Phosphorus: 2.4 mg/dL — ABNORMAL LOW (ref 2.5–4.6)
Phosphorus: 2.8 mg/dL (ref 2.5–4.6)
Potassium: 3.4 mmol/L — ABNORMAL LOW (ref 3.5–5.1)
Potassium: 4.3 mmol/L (ref 3.5–5.1)
Sodium: 132 mmol/L — ABNORMAL LOW (ref 135–145)
Sodium: 135 mmol/L (ref 135–145)

## 2023-04-04 LAB — COOXEMETRY PANEL
Carboxyhemoglobin: 3 % — ABNORMAL HIGH (ref 0.5–1.5)
Carboxyhemoglobin: 3.1 % — ABNORMAL HIGH (ref 0.5–1.5)
Methemoglobin: <0.7 % (ref 0.0–1.5)
Methemoglobin: <0.7 % (ref 0.0–1.5)
O2 Saturation: 95.1 %
O2 Saturation: 83.6 %
Total hemoglobin: 11.7 g/dL — ABNORMAL LOW (ref 12.0–16.0)
Total hemoglobin: 11.3 g/dL — ABNORMAL LOW (ref 12.0–16.0)

## 2023-04-04 LAB — HEPATIC FUNCTION PANEL
ALT: 240 U/L — ABNORMAL HIGH (ref 0–44)
AST: 151 U/L — ABNORMAL HIGH (ref 15–41)
Albumin: 2 g/dL — ABNORMAL LOW (ref 3.5–5.0)
Alkaline Phosphatase: 178 U/L — ABNORMAL HIGH (ref 38–126)
Bilirubin, Direct: 11.2 mg/dL — ABNORMAL HIGH (ref 0.0–0.2)
Indirect Bilirubin: 7.8 mg/dL — ABNORMAL HIGH (ref 0.3–0.9)
Total Bilirubin: 19 mg/dL (ref 0.3–1.2)
Total Protein: 7.8 g/dL (ref 6.5–8.1)

## 2023-04-04 LAB — HEPARIN LEVEL (UNFRACTIONATED)
Heparin Unfractionated: 0.32 [IU]/mL (ref 0.30–0.70)
Heparin Unfractionated: 0.17 [IU]/mL — ABNORMAL LOW (ref 0.30–0.70)

## 2023-04-04 LAB — MAGNESIUM: Magnesium: 2.5 mg/dL — ABNORMAL HIGH (ref 1.7–2.4)

## 2023-04-04 LAB — APTT
aPTT: 89 s — ABNORMAL HIGH (ref 24–36)
aPTT: 81 s — ABNORMAL HIGH (ref 24–36)

## 2023-04-04 SURGERY — RIGHT HEART CATH
Anesthesia: LOCAL

## 2023-04-04 MED ORDER — FENTANYL CITRATE PF 50 MCG/ML IJ SOSY
PREFILLED_SYRINGE | INTRAMUSCULAR | Status: AC
  Filled 2023-04-04: qty 2

## 2023-04-04 MED ORDER — POTASSIUM PHOSPHATES 15 MMOLE/5ML IV SOLN
15.0000 mmol | Freq: Once | INTRAVENOUS | Status: AC
  Administered 2023-04-04: 15 mmol via INTRAVENOUS
  Filled 2023-04-04 (×2): qty 5

## 2023-04-04 MED ORDER — HEPARIN (PORCINE) IN NACL 1000-0.9 UT/500ML-% IV SOLN
INTRAVENOUS | Status: DC | PRN
  Administered 2023-04-04: 500 mL

## 2023-04-04 MED ORDER — SODIUM CHLORIDE 0.9 % IV SOLN: INTRAVENOUS | Status: DC

## 2023-04-04 MED ORDER — SODIUM CHLORIDE 0.9% FLUSH: 3.0000 mL | INTRAVENOUS | Status: DC | PRN

## 2023-04-04 MED ORDER — MIDAZOLAM HCL 2 MG/2ML IJ SOLN
INTRAMUSCULAR | Status: DC | PRN
  Administered 2023-04-04: 2 mg via INTRAVENOUS

## 2023-04-04 MED ORDER — HEPARIN (PORCINE) 25000 UT/250ML-% IV SOLN
2000.0000 [IU]/h | INTRAVENOUS | Status: DC
  Administered 2023-04-04: 2100 [IU]/h via INTRAVENOUS
  Administered 2023-04-05 – 2023-04-06 (×3): 2200 [IU]/h via INTRAVENOUS
  Administered 2023-04-06 – 2023-04-09 (×6): 2000 [IU]/h via INTRAVENOUS
  Filled 2023-04-04 (×9): qty 250

## 2023-04-04 MED ORDER — LIDOCAINE HCL (PF) 1 % IJ SOLN
INTRAMUSCULAR | Status: AC
  Filled 2023-04-04: qty 30

## 2023-04-04 MED ORDER — PROCHLORPERAZINE EDISYLATE 10 MG/2ML IJ SOLN
5.0000 mg | Freq: Four times a day (QID) | INTRAMUSCULAR | Status: AC | PRN
  Administered 2023-04-04 – 2023-04-09 (×2): 5 mg via INTRAVENOUS
  Filled 2023-04-04 (×3): qty 1

## 2023-04-04 MED ORDER — LABETALOL HCL 5 MG/ML IV SOLN: 10.0000 mg | INTRAVENOUS | Status: DC | PRN

## 2023-04-04 MED ORDER — ALBUMIN HUMAN 5 % IV SOLN
12.5000 g | Freq: Once | INTRAVENOUS | Status: AC
  Administered 2023-04-04: 12.5 g via INTRAVENOUS
  Filled 2023-04-04: qty 250

## 2023-04-04 MED ORDER — FENTANYL CITRATE (PF) 100 MCG/2ML IJ SOLN
INTRAMUSCULAR | Status: AC
  Filled 2023-04-04: qty 2

## 2023-04-04 MED ORDER — LIDOCAINE HCL (PF) 1 % IJ SOLN
INTRAMUSCULAR | Status: DC | PRN
  Administered 2023-04-04: 10 mL via INTRADERMAL

## 2023-04-04 MED ORDER — MIDAZOLAM HCL 2 MG/2ML IJ SOLN
INTRAMUSCULAR | Status: AC
  Filled 2023-04-04: qty 2

## 2023-04-04 MED ORDER — ETOMIDATE 2 MG/ML IV SOLN
INTRAVENOUS | Status: AC
  Filled 2023-04-04: qty 20

## 2023-04-04 MED ORDER — SUCCINYLCHOLINE CHLORIDE 200 MG/10ML IV SOSY
PREFILLED_SYRINGE | INTRAVENOUS | Status: AC
  Filled 2023-04-04: qty 10

## 2023-04-04 MED ORDER — SODIUM CHLORIDE 0.9% FLUSH
3.0000 mL | Freq: Two times a day (BID) | INTRAVENOUS | Status: DC
  Administered 2023-04-04 – 2023-04-09 (×10): 3 mL via INTRAVENOUS

## 2023-04-04 MED ORDER — HYDRALAZINE HCL 20 MG/ML IJ SOLN: 10.0000 mg | INTRAMUSCULAR | Status: DC | PRN

## 2023-04-04 MED ORDER — ACETAMINOPHEN 325 MG PO TABS: 650.0000 mg | ORAL_TABLET | ORAL | Status: DC | PRN

## 2023-04-04 MED ORDER — ROCURONIUM BROMIDE 10 MG/ML (PF) SYRINGE
PREFILLED_SYRINGE | INTRAVENOUS | Status: AC
  Filled 2023-04-04: qty 10

## 2023-04-04 MED ORDER — FENTANYL CITRATE (PF) 100 MCG/2ML IJ SOLN
INTRAMUSCULAR | Status: DC | PRN
  Administered 2023-04-04: 50 ug via INTRAVENOUS

## 2023-04-04 MED ORDER — KETAMINE HCL 50 MG/5ML IJ SOSY
PREFILLED_SYRINGE | INTRAMUSCULAR | Status: AC
  Filled 2023-04-04: qty 10

## 2023-04-04 MED ORDER — SODIUM CHLORIDE 0.9 % IV SOLN
250.0000 mL | INTRAVENOUS | Status: DC | PRN
  Administered 2023-04-06 – 2023-04-08 (×2): 250 mL via INTRAVENOUS

## 2023-04-04 SURGICAL SUPPLY — 9 items
CATH SWAN GANZ 7F STRAIGHT (CATHETERS) IMPLANT
KIT MICROPUNCTURE NIT STIFF (SHEATH) IMPLANT
MAT PREVALON FULL STRYKER (MISCELLANEOUS) IMPLANT
PACK CARDIAC CATHETERIZATION (CUSTOM PROCEDURE TRAY) ×1 IMPLANT
SHEATH PINNACLE 7F 10CM (SHEATH) IMPLANT
SHEATH PROBE COVER 6X72 (BAG) IMPLANT
TRANSDUCER W/STOPCOCK (MISCELLANEOUS) IMPLANT
TUBING ART PRESS 72 MALE/FEM (TUBING) IMPLANT
WIRE EMERALD 3MM-J .025X260CM (WIRE) IMPLANT

## 2023-04-04 NOTE — Consult Note (Signed)
H&P  Chief Complaint: scrotal wound, possible no blood flow of right testicle on SCRUS  History of Present Illness: Timothy Palmer is a 57 y.o. year old For which urology is consulted to evaluate for scrotal wound and possible irregular ultrasound.  Patient initially presented to the emergency department September 21 complaining of diarrhea x 4 days, poor p.o. intake, and generalized weakness.  His lactate was elevated in the ED he was found be hypotensive.  He has a history of ESRD and dialyzes TTS.  On admission he was noticed to have a small scrotal wound.  There is a picture scanned in the chart where looks like a quarter sized desquamation .  This is progressed since then and urology was consulted  Of note he underwent a right heart cath today with Dr. Gala Romney  He is currently intubated in the ICU. At the time of my exam no family was present.   Scrotal ultrasound reviewed.  There is no evidence of any sort of discrete fluid collections, free air, or scrotal abscesses.  Waveform not demonstrated but it does appear on certain views blood flow was demonstrated.  The testicles appear somewhat heterogenous.  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 The aneurysm measures 1.4 x 1.1 cm which is  significantly larger than the 4.5 mm parent vessel.    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/2014   Complex valvuloplasty including autologous pericardial patch repair of large perforation of posterior leaflet due to endocarditis with 28 mm Sorin Memo 3D ring annuloplasty via right mini thoracotomy approach   Septic embolism to left lower extremity 03/25/2014   Severe mitral regurgitation 03/23/2014   by TEE   Shortness of breath dyspnea    with exertion   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    Past Surgical History:  Procedure Laterality Date   AV FISTULA PLACEMENT Left ?2005   forearm    BASCILIC VEIN TRANSPOSITION Left 12/27/2015   Procedure: FIRST STAGE BASILIC VEIN TRANSPOSITION;  Surgeon: Fransisco Hertz, MD;  Location: [facility] OR;  Service: Vascular;  Laterality: Left;   BASCILIC VEIN TRANSPOSITION Left 02/12/2016   Procedure: SECOND STAGE BASILIC VEIN TRANSPOSITION;  Surgeon: Fransisco Hertz, MD;  Location: [facility] OR;  Service: Vascular;  Laterality: Left;   CENTRAL LINE INSERTION  04/01/2023   Procedure: CENTRAL LINE INSERTION;  Surgeon: Dolores Patty, MD;  Location: [facility] INVASIVE CV LAB;  Service: Cardiovascular;;   EMBOLECTOMY Left 03/25/2014   Procedure: EMBOLECTOMY left popliteal;  Surgeon: Larina Earthly, MD;  Location: [facility] OR;  Service: Vascular;  Laterality: Left;   FEMORAL-POPLITEAL BYPASS GRAFT Left 03/25/2014   Procedure: Left Femoral- Below Knee Popliteal Bypass Graft;  Surgeon: Kristen Loader  Early, MD;  Location: [facility] OR;  Service: Vascular;  Laterality: Left;   INTRAOPERATIVE TRANSESOPHAGEAL ECHOCARDIOGRAM N/A 04/05/2014   Procedure: INTRAOPERATIVE TRANSESOPHAGEAL ECHOCARDIOGRAM;  Surgeon: Purcell Nails, MD;  Location: [facility] OR;  Service: Open Heart Surgery;  Laterality: N/A;   LEFT AND RIGHT HEART CATHETERIZATION WITH CORONARY ANGIOGRAM N/A 03/28/2014   Procedure: LEFT AND RIGHT HEART CATHETERIZATION  WITH CORONARY ANGIOGRAM;  Surgeon: Laurey Morale, MD;  Location: [facility] CATH LAB;  Service: Cardiovascular;  Laterality: N/A;   MITRAL VALVE REPAIR Right 04/05/2014   Procedure: MINIMALLY INVASIVE MITRAL VALVE REPAIR (MVR);  Surgeon: Purcell Nails, MD;  Location: [facility] OR;  Service: Open Heart Surgery;  Laterality: Right;   MITRAL VALVE REPLACEMENT Right 04/05/2014   Procedure: Bring back MINIMALLY INVASIVE MITRAL VALVE (MV) REPLACEMENT - Reexploration for bleeding;  Surgeon: Purcell Nails, MD;  Location: [facility] OR;  Service: Open Heart Surgery;  Laterality: Right;   PARATHYROIDECTOMY N/A 03/22/2016   Procedure: PARATHYROIDECTOMY AUTOTRANSPLANT;  Surgeon: Darnell Level, MD;  Location: [facility] OR;  Service: General;  Laterality: N/A;   PATCH ANGIOPLASTY Left 07/23/2013   Procedure: PATCH ANGIOPLASTY- LEFT RADIOCEPHALIC ARTERIOVENOUS FISTULA;  Surgeon: Chuck Hint, MD;  Location: [facility] OR;  Service: Vascular;  Laterality: Left;   REVISON OF ARTERIOVENOUS FISTULA Left 11/04/2019   Procedure: REVISON OF LEFT ARM ARTERIOVENOUS FISTULA;  Surgeon: Larina Earthly, MD;  Location: [facility] OR;  Service: Vascular;  Laterality: Left;   RIGHT HEART CATH N/A 04/01/2023   Procedure: RIGHT HEART CATH;  Surgeon: Dolores Patty, MD;  Location: [facility] INVASIVE CV LAB;  Service: Cardiovascular;  Laterality: N/A;   SHUNTOGRAM Left November 04, 2011   TEE WITHOUT CARDIOVERSION N/A 03/24/2014   Procedure: TRANSESOPHAGEAL ECHOCARDIOGRAM (TEE);  Surgeon: Laurey Morale, MD;  Location: [facility] ENDOSCOPY;  Service: Cardiovascular;  Laterality: N/A;   TEE WITHOUT CARDIOVERSION N/A 04/12/2015   Procedure: TRANSESOPHAGEAL ECHOCARDIOGRAM (TEE);  Surgeon: Laurey Morale, MD;  Location: [facility] ENDOSCOPY;  Service: Cardiovascular;  Laterality: N/A;   TEE WITHOUT CARDIOVERSION N/A 08/18/2015   Procedure: TRANSESOPHAGEAL ECHOCARDIOGRAM (TEE);  Surgeon: Laurey Morale, MD;  Location: [facility] ENDOSCOPY;  Service: Cardiovascular;  Laterality: N/A;    Home  Medications:  No current facility-administered medications on file prior to encounter.   Current Outpatient Medications on File Prior to Encounter  Medication Sig Dispense Refill   lisinopril (PRINIVIL,ZESTRIL) 20 MG tablet Take 20 mg by mouth daily.     multivitamin (RENA-VIT) TABS tablet Take 1 tablet by mouth at bedtime. (Patient taking differently: Take 1 tablet by mouth 3 (three) times a week.) 30 tablet 0   sevelamer carbonate (RENVELA) 800 MG tablet Take 1,600 mg by mouth daily.     Nutritional Supplements (FEEDING SUPPLEMENT, NEPRO CARB STEADY,) LIQD Take 237 mLs by mouth 2 (two) times daily between meals. (Patient not taking: Reported on 11/04/2019)       Allergies: No Known Allergies  Family History  Problem Relation Age of Onset   Diabetes Mother    Hypertension Mother    Hypertension Father    Hypertension Sister    Hypertension Brother     Social History:  reports that he has never smoked. He has never used smokeless tobacco. He reports current alcohol use. He reports current drug use. Frequency: 7.00 times per week. Drug: Marijuana.  ROS: A complete review of systems was performed.  All systems are negative except for pertinent findings as noted.  Physical Exam:  Vital signs in last 24 hours: Temp:  [  94.4 F (34.7 C)-98.6 F (37 C)] 98.3 F (36.8 C) (09/27 1527) Pulse Rate:  [78-141] 137 (09/27 1745) Resp:  [20-33] 27 (09/27 1745) SpO2:  [91 %-100 %] 100 % (09/27 1745) Arterial Line BP: (86-174)/(40-77) 107/51 (09/27 1745) FiO2 (%):  [30 %-40 %] 40 % (09/27 1600) Weight:  [105.2 kg] 105.2 kg (09/27 0500) Constitutional:  Alert, intubated Cardiovascular: Regular rate and rhythm Respiratory: Normal respiratory effort, Lungs clear bilaterally GI: Abdomen is soft, nontender, nondistended, no abdominal masses GU: diffuse well demarcated desquamation of the scrotum. There is edema present int he scrotum and penis. Patient did have some TTP with exam Lymphatic: No  lymphadenopathy    Laboratory Data:  Recent Labs    04/02/23 0441 04/03/23 0459 04/04/23 0210 04/04/23 1139  WBC 17.1* 20.2* 24.9*  --   HGB 10.4* 10.7* 11.1* 12.6*  12.9*  HCT 29.6* 31.0* 31.8* 37.0*  38.0*  PLT 125* 121* 128*  --     Recent Labs    04/02/23 1740 04/03/23 0459 04/03/23 1555 04/04/23 0210 04/04/23 1139 04/04/23 1527  NA 135 135 135 132* 138  138 135  K 3.2* 3.2* 3.8 3.4* 4.2  4.3 4.3  CL 103 104 99 98  --  102  GLUCOSE 167* 169* 225* 235*  --  143*  BUN 26* 27* 29* 31*  --  34*  CALCIUM 7.4* 7.8* 8.1* 8.4*  --  8.2*  CREATININE 2.10* 2.07* 1.97* 1.24  --  1.81*     Results for orders placed or performed during the hospital encounter of 03/29/23 (from the past 24 hour(s))  Glucose, capillary     Status: Abnormal   Collection Time: 04/03/23  7:23 PM  Result Value Ref Range   Glucose-Capillary 214 (H) 70 - 99 mg/dL  Glucose, capillary     Status: Abnormal   Collection Time: 04/03/23 11:33 PM  Result Value Ref Range   Glucose-Capillary 195 (H) 70 - 99 mg/dL  CBC     Status: Abnormal   Collection Time: 04/04/23  2:10 AM  Result Value Ref Range   WBC 24.9 (H) 4.0 - 10.5 K/uL   RBC 3.92 (L) 4.22 - 5.81 MIL/uL   Hemoglobin 11.1 (L) 13.0 - 17.0 g/dL   HCT 03.4 (L) 74.2 - 59.5 %   MCV 81.1 80.0 - 100.0 fL   MCH 28.3 26.0 - 34.0 pg   MCHC 34.9 30.0 - 36.0 g/dL   RDW 63.8 (H) 75.6 - 43.3 %   Platelets 128 (L) 150 - 400 K/uL   nRBC 7.1 (H) 0.0 - 0.2 %  Heparin level (unfractionated)     Status: None   Collection Time: 04/04/23  2:10 AM  Result Value Ref Range   Heparin Unfractionated 0.32 0.30 - 0.70 IU/mL  Magnesium     Status: Abnormal   Collection Time: 04/04/23  2:10 AM  Result Value Ref Range   Magnesium 2.5 (H) 1.7 - 2.4 mg/dL  Renal function panel (daily at 0500)     Status: Abnormal   Collection Time: 04/04/23  2:10 AM  Result Value Ref Range   Sodium 132 (L) 135 - 145 mmol/L   Potassium 3.4 (L) 3.5 - 5.1 mmol/L   Chloride 98 98 -  111 mmol/L   CO2 22 22 - 32 mmol/L   Glucose, Bld 235 (H) 70 - 99 mg/dL   BUN 31 (H) 6 - 20 mg/dL   Creatinine, Ser 2.95 0.61 - 1.24 mg/dL   Calcium 8.4 (L)  8.9 - 10.3 mg/dL   Phosphorus 2.4 (L) 2.5 - 4.6 mg/dL   Albumin 2.0 (L) 3.5 - 5.0 g/dL   GFR, Estimated >16 >10 mL/min   Anion gap 12 5 - 15  Cooxemetry Panel (carboxy, met, total hgb, O2 sat)     Status: Abnormal   Collection Time: 04/04/23  2:10 AM  Result Value Ref Range   Total hemoglobin 11.7 (L) 12.0 - 16.0 g/dL   O2 Saturation 96.0 %   Carboxyhemoglobin 3.0 (H) 0.5 - 1.5 %   Methemoglobin <0.7 0.0 - 1.5 %  Hepatic function panel     Status: Abnormal   Collection Time: 04/04/23  2:10 AM  Result Value Ref Range   Total Protein 7.8 6.5 - 8.1 g/dL   Albumin 2.0 (L) 3.5 - 5.0 g/dL   AST 454 (H) 15 - 41 U/L   ALT 240 (H) 0 - 44 U/L   Alkaline Phosphatase 178 (H) 38 - 126 U/L   Total Bilirubin 19.0 (HH) 0.3 - 1.2 mg/dL   Bilirubin, Direct 09.8 (H) 0.0 - 0.2 mg/dL   Indirect Bilirubin 7.8 (H) 0.3 - 0.9 mg/dL  APTT     Status: Abnormal   Collection Time: 04/04/23  2:10 AM  Result Value Ref Range   aPTT 89 (H) 24 - 36 seconds  Glucose, capillary     Status: Abnormal   Collection Time: 04/04/23  3:10 AM  Result Value Ref Range   Glucose-Capillary 193 (H) 70 - 99 mg/dL  Glucose, capillary     Status: Abnormal   Collection Time: 04/04/23  7:49 AM  Result Value Ref Range   Glucose-Capillary 159 (H) 70 - 99 mg/dL  Cooxemetry Panel (carboxy, met, total hgb, O2 sat)     Status: Abnormal   Collection Time: 04/04/23  9:16 AM  Result Value Ref Range   Total hemoglobin 11.3 (L) 12.0 - 16.0 g/dL   O2 Saturation 11.9 %   Carboxyhemoglobin 3.1 (H) 0.5 - 1.5 %   Methemoglobin <0.7 0.0 - 1.5 %  APTT     Status: Abnormal   Collection Time: 04/04/23 10:11 AM  Result Value Ref Range   aPTT 81 (H) 24 - 36 seconds  Heparin level (unfractionated)     Status: Abnormal   Collection Time: 04/04/23 10:11 AM  Result Value Ref Range    Heparin Unfractionated 0.17 (L) 0.30 - 0.70 IU/mL  I-STAT 7, (LYTES, BLD GAS, ICA, H+H)     Status: Abnormal   Collection Time: 04/04/23 11:39 AM  Result Value Ref Range   pH, Arterial 7.405 7.35 - 7.45   pCO2 arterial 41.9 32 - 48 mmHg   pO2, Arterial 39 (LL) 83 - 108 mmHg   Bicarbonate 26.3 20.0 - 28.0 mmol/L   TCO2 28 22 - 32 mmol/L   O2 Saturation 73 %   Acid-Base Excess 1.0 0.0 - 2.0 mmol/L   Sodium 138 135 - 145 mmol/L   Potassium 4.3 3.5 - 5.1 mmol/L   Calcium, Ion 1.09 (L) 1.15 - 1.40 mmol/L   HCT 38.0 (L) 39.0 - 52.0 %   Hemoglobin 12.9 (L) 13.0 - 17.0 g/dL   Sample type ARTERIAL    Comment NOTIFIED PHYSICIAN   I-STAT 7, (LYTES, BLD GAS, ICA, H+H)     Status: Abnormal   Collection Time: 04/04/23 11:39 AM  Result Value Ref Range   pH, Arterial 7.389 7.35 - 7.45   pCO2 arterial 43.6 32 - 48 mmHg   pO2, Arterial 37 (  LL) 83 - 108 mmHg   Bicarbonate 26.4 20.0 - 28.0 mmol/L   TCO2 28 22 - 32 mmol/L   O2 Saturation 70 %   Acid-Base Excess 1.0 0.0 - 2.0 mmol/L   Sodium 138 135 - 145 mmol/L   Potassium 4.2 3.5 - 5.1 mmol/L   Calcium, Ion 1.11 (L) 1.15 - 1.40 mmol/L   HCT 37.0 (L) 39.0 - 52.0 %   Hemoglobin 12.6 (L) 13.0 - 17.0 g/dL   Sample type ARTERIAL    Comment NOTIFIED PHYSICIAN   Glucose, capillary     Status: Abnormal   Collection Time: 04/04/23 12:26 PM  Result Value Ref Range   Glucose-Capillary 130 (H) 70 - 99 mg/dL  Glucose, capillary     Status: Abnormal   Collection Time: 04/04/23  3:25 PM  Result Value Ref Range   Glucose-Capillary 143 (H) 70 - 99 mg/dL  Renal function panel (daily at 1600)     Status: Abnormal   Collection Time: 04/04/23  3:27 PM  Result Value Ref Range   Sodium 135 135 - 145 mmol/L   Potassium 4.3 3.5 - 5.1 mmol/L   Chloride 102 98 - 111 mmol/L   CO2 23 22 - 32 mmol/L   Glucose, Bld 143 (H) 70 - 99 mg/dL   BUN 34 (H) 6 - 20 mg/dL   Creatinine, Ser 2.95 (H) 0.61 - 1.24 mg/dL   Calcium 8.2 (L) 8.9 - 10.3 mg/dL   Phosphorus 2.8 2.5 -  4.6 mg/dL   Albumin 1.8 (L) 3.5 - 5.0 g/dL   GFR, Estimated 44 (L) >60 mL/min   Anion gap 10 5 - 15   Recent Results (from the past 240 hour(s))  SARS Coronavirus 2 by RT PCR (hospital order, performed in [facility] hospital lab) *cepheid single result test* Anterior Nasal Swab     Status: None   Collection Time: 03/29/23  7:56 AM   Specimen: Anterior Nasal Swab  Result Value Ref Range Status   SARS Coronavirus 2 by RT PCR NEGATIVE NEGATIVE Final    Comment: Performed at [facility] Lab, [address] [place], [place] [address]  Blood culture (routine x 2)     Status: None   Collection Time: 03/29/23  8:08 AM   Specimen: BLOOD  Result Value Ref Range Status   Specimen Description BLOOD RIGHT ANTECUBITAL  Final   Special Requests   Final    BOTTLES DRAWN AEROBIC AND ANAEROBIC Blood Culture adequate volume   Culture   Final    NO GROWTH 5 DAYS Performed at [facility] Lab, [address] [place], [place] [address]    Report Status 04/03/2023 FINAL  Final  Blood culture (routine x 2)     Status: None   Collection Time: 03/29/23  8:47 AM   Specimen: BLOOD RIGHT HAND  Result Value Ref Range Status   Specimen Description BLOOD RIGHT HAND  Final   Special Requests   Final    BOTTLES DRAWN AEROBIC AND ANAEROBIC Blood Culture adequate volume   Culture   Final    NO GROWTH 5 DAYS Performed at [facility], [address], [place], [place] [address]    Report Status 04/03/2023 FINAL  Final  Respiratory (~20 pathogens) panel by PCR     Status: None   Collection Time: 03/29/23 11:37 AM   Specimen: Nasopharyngeal Swab; Respiratory  Result Value Ref Range Status   Adenovirus NOT DETECTED NOT DETECTED Final   Coronavirus 229E  NOT DETECTED NOT DETECTED Final    Comment: (NOTE) The Coronavirus on the Respiratory Panel, DOES NOT test for the novel  Coronavirus (2019 nCoV)    Coronavirus HKU1 NOT DETECTED NOT DETECTED Final   Coronavirus NL63 NOT DETECTED NOT DETECTED Final    Coronavirus OC43 NOT DETECTED NOT DETECTED Final   Metapneumovirus NOT DETECTED NOT DETECTED Final   Rhinovirus / Enterovirus NOT DETECTED NOT DETECTED Final   Influenza A NOT DETECTED NOT DETECTED Final   Influenza B NOT DETECTED NOT DETECTED Final   Parainfluenza Virus 1 NOT DETECTED NOT DETECTED Final   Parainfluenza Virus 2 NOT DETECTED NOT DETECTED Final   Parainfluenza Virus 3 NOT DETECTED NOT DETECTED Final   Parainfluenza Virus 4 NOT DETECTED NOT DETECTED Final   Respiratory Syncytial Virus NOT DETECTED NOT DETECTED Final   Bordetella pertussis NOT DETECTED NOT DETECTED Final   Bordetella Parapertussis NOT DETECTED NOT DETECTED Final   Chlamydophila pneumoniae NOT DETECTED NOT DETECTED Final   Mycoplasma pneumoniae NOT DETECTED NOT DETECTED Final    Comment: Performed at [facility], [address] [place], [place] [address]  MRSA Next Gen by PCR, Nasal     Status: None   Collection Time: 03/29/23 12:52 PM   Specimen: Nasal Mucosa; Nasal Swab  Result Value Ref Range Status   MRSA by PCR Next Gen NOT DETECTED NOT DETECTED Final    Comment: (NOTE) The GeneXpert MRSA Assay (FDA approved for NASAL specimens only), is one component of a comprehensive MRSA colonization surveillance program. It is not intended to diagnose MRSA infection nor to guide or monitor treatment for MRSA infections. Test performance is not FDA approved in patients less than 33 years old. Performed at [facility], [address], [place], [place] [address]     Renal Function: Recent Labs    04/01/23 1542 04/02/23 0454 04/02/23 1740 04/03/23 0459 04/03/23 1555 04/04/23 0210 04/04/23 1527  CREATININE 2.42* 2.26* 2.10* 2.07* 1.97* 1.24 1.81*   Estimated Creatinine Clearance: 56.9 mL/min (A) (by C-G formula based on SCr of 1.81 mg/dL (H)).  Radiologic Imaging: DG Abd Portable 1V  Result Date: 04/04/2023 CLINICAL DATA:  Orogastric tube placement. EXAM: PORTABLE ABDOMEN - 1 VIEW  COMPARISON:  Radiograph earlier today, CTA 03/29/2023 FINDINGS: The enteric tube tip and side port are below the diaphragm in the stomach, however the tip is directed towards the gastric cardia. Decreased gaseous gastric distension since earlier today. There is gaseous small bowel distention up to 4.4 cm. IMPRESSION: 1. The enteric tube tip and side port are below the diaphragm in the stomach, however the tip is directed towards the gastric cardia. Consider repositioning if the tube will be used for feeding. 2. Gaseous small bowel distention up to 4.4 cm. Electronically Signed   By: Narda Rutherford M.D.   On: 04/04/2023 16:14   US SCROTUM W/DOPPLER  Addendum Date: 04/04/2023   ADDENDUM REPORT: 04/04/2023 12:31 ADDENDUM: Critical Value/emergent results were called by telephone at the time of interpretation on 04/04/2023 at 12:31 pm to provider [facility] , who verbally acknowledged these results. Electronically Signed   By: Lupita Raider M.D.   On: 04/04/2023 12:31   Result Date: 04/04/2023 CLINICAL DATA:  Abscess. EXAM: SCROTAL ULTRASOUND DOPPLER ULTRASOUND OF THE TESTICLES TECHNIQUE: Complete ultrasound examination of the testicles, epididymis, and other scrotal structures was performed. Color and spectral Doppler ultrasound were also utilized to evaluate blood flow to the testicles. COMPARISON:  CT scan of March 29, 2023. FINDINGS: Right testicle Measurements: 3.3 x 2.9 x 1.8 cm. No mass or microlithiasis visualized. Left testicle Measurements: 3.3 x 2.7 x 2.4 cm. No mass or microlithiasis visualized. Right epididymis:  Normal in size and appearance. Left epididymis: Tail may be slightly heterogeneous and enlarged suggesting possible epididymitis. Hydrocele:  None visualized. Varicocele:  None visualized. Pulsed Doppler interrogation of left demonstrates normal low resistance arterial and venous waveforms bilaterally. Flow could not be detected in right testicle, but this may be due to overlying  severe scrotal skin thickening and edema, although torsion cannot be excluded. IMPRESSION: No evidence of testicular mass. Doppler waveforms could not be obtained in the right testicle, with this may be due to overlying severe scrotal wall thickening and edema. However, portion of the right testicle cannot be excluded and clinical correlation is recommended. There is no evidence of torsion in the left testicle. Left epididymal tail is slightly heterogeneous enlarged suggesting possible epididymitis. Electronically Signed: By: Lupita Raider M.D. On: 04/04/2023 12:21   CARDIAC CATHETERIZATION  Addendum Date: 04/04/2023   Findings: On milrinone 0.125 and iNO 40 ppm Ao = 141/56 RA = 13 RV = 93/15 PA = 96/34 (55) PCW = 13 Fick cardiac output/index = 7.3/3.3 Thermo CO/CI = 5.8/2.6 PVR = 5.8 (Fick) 7.2 (TD) SVR = 743 (Fick) 944 (TD) Ao sat = 99% PA sat = 71%, 74% PAPi = 4.8 Assessment: 1. PVR, cardiac output and PAPi significantly improved with milrinone and high-dose iNO with resultant increase in PA pressures 2. 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 Given only moderately elevated CO, I do not think ligating his AVF will reduce his PA pressures enough to change his outcome. At this point, would continue medical treatment and if he fails vent wean would suggest Palliative Care involvement. Arvilla Meres, MD 12:17 PM    Result Date: 04/04/2023 Findings: On milrinone 0.125 and iNO 40 ppm Ao = 141/56 RA = 13 RV = 93/15 PA = 96/34 (55) PCW = 13 Fick cardiac output/index = 7.3/3.3 Thermo CO/CI = 5.8/2.6 PVR = 5.8 (Fick) 7.2 (TD) SVR = 743 (Fick) 944 (TD) Ao sat = 99% PA sat = 71%, 74% PAPi = 4.8 Assessment: 1. PVR, cardiac output and PAPi significantly improved with milrinone and high-dose iNO with resultant increase in PA pressures 2. Severe PAH Plan/Discussion: Suspect severe/end-stage PAH is  combination of underlying high flow from AVF as well as WHO Group 1 and 2 PH. Given only moderately elevated CO, I do not think ligating his AVF will reduce his PA pressures enough to change his outcome. At this point, would continue medical treatment and if he fails vent wean would suggest Palliative Care involvement. Arvilla Meres, MD 12:17 PM    DG Chest Port 1 View  Result Date: 04/04/2023 CLINICAL DATA:  Acute respiratory failure, aspiration in the airway. Ileus. EXAM: PORTABLE CHEST 1 VIEW, ABDOMEN ONE VIEW COMPARISON:  03/31/2023, 04/01/2023. FINDINGS: The heart is enlarged and the mediastinal contour is stable. A prosthetic cardiac valve is noted. There is atherosclerotic calcification of the aorta. The pulmonary vasculature is mildly distended. Focal airspace disease is noted in the suprahilar region on the right. Mild atelectasis is present at the right lung base. No effusion or pneumothorax. A right internal jugular central venous catheter appear stable. An enteric tube courses over the left upper quadrant. The endotracheal tube terminates 4.2 cm above the carina.  Surgical clips are noted in the cervical soft tissues. There is gaseous distention of the stomach with an enteric tube in place. Mildly distended loops of small bowel are noted in the abdomen measuring up to 3.8 cm. Rim calcified structure is noted in the right lower quadrant, compatible with known rim calcified renal cyst. No acute osseous abnormality is seen. IMPRESSION: 1. Cardiomegaly with mildly distended pulmonary vascularity. 2. Mild airspace disease in the suprahilar region on the right. 3. Atelectasis at the right lung base. Support apparatus as described above. 4. Gaseous distention of the stomach with enteric tube in place. 5. Distended loops of small bowel in the abdomen measuring up to 4.2 cm, possible ileus or obstruction. Electronically Signed   By: Thornell Sartorius M.D.   On: 04/04/2023 03:36   DG Abd 1 View  Result Date:  04/04/2023 CLINICAL DATA:  Acute respiratory failure, aspiration in the airway. Ileus. EXAM: PORTABLE CHEST 1 VIEW, ABDOMEN ONE VIEW COMPARISON:  03/31/2023, 04/01/2023. FINDINGS: The heart is enlarged and the mediastinal contour is stable. A prosthetic cardiac valve is noted. There is atherosclerotic calcification of the aorta. The pulmonary vasculature is mildly distended. Focal airspace disease is noted in the suprahilar region on the right. Mild atelectasis is present at the right lung base. No effusion or pneumothorax. A right internal jugular central venous catheter appear stable. An enteric tube courses over the left upper quadrant. The endotracheal tube terminates 4.2 cm above the carina. Surgical clips are noted in the cervical soft tissues. There is gaseous distention of the stomach with an enteric tube in place. Mildly distended loops of small bowel are noted in the abdomen measuring up to 3.8 cm. Rim calcified structure is noted in the right lower quadrant, compatible with known rim calcified renal cyst. No acute osseous abnormality is seen. IMPRESSION: 1. Cardiomegaly with mildly distended pulmonary vascularity. 2. Mild airspace disease in the suprahilar region on the right. 3. Atelectasis at the right lung base. Support apparatus as described above. 4. Gaseous distention of the stomach with enteric tube in place. 5. Distended loops of small bowel in the abdomen measuring up to 4.2 cm, possible ileus or obstruction. Electronically Signed   By: Thornell Sartorius M.D.   On: 04/04/2023 03:36   ECHOCARDIOGRAM LIMITED  Result Date: 04/03/2023    ECHOCARDIOGRAM LIMITED REPORT   Patient Name:   JANTZEN HEON [facility] Date of Exam: 04/03/2023 Medical Rec #:  [id number]      Height:       71.0 in Accession #:    [id number]     Weight:       237.9 lb Date of Birth:  [birth year]     BSA:          2.270 m Patient Age:    56 years       BP:           115/46 mmHg Patient Gender: M              HR:           77 bpm. Exam  Location:  Inpatient Procedure: Limited Echo, Limited Color Doppler and Cardiac Doppler Indications:    CHF-Acute Diastolic I50.31  History:        Patient has prior history of Echocardiogram examinations, most                 recent 03/29/2023. CHF, Shock, Mitral Valve Disease and Aortic  Valve Disease, Arrythmias:Atrial Fibrillation and Tachycardia,                 Signs/Symptoms:Hypotension; Risk Factors:Non-Smoker.                  Mitral Valve: 28 mm prosthetic annuloplasty ring valve is                 present in the mitral position.  Sonographer:    Aron Baba Referring Phys: [id number] CHRISTOPHER L SCHUMANN IMPRESSIONS  1. Left ventricular ejection fraction, by estimation, is 40 to 45%. The left ventricle has mildly decreased function. The left ventricle demonstrates global hypokinesis. There is mild left ventricular hypertrophy of the inferior segment. Left ventricular diastolic function could not be evaluated. There is the interventricular septum is flattened in systole and diastole, consistent with right ventricular pressure and volume overload.  2. Right ventricular systolic function is moderately reduced. The right ventricular size is severely enlarged. Tricuspid regurgitation signal is inadequate for assessing PA pressure.  3. Right atrial size was severely dilated.  4. The mitral valve has been repaired/replaced. Mild mitral valve regurgitation. There is a 28 mm prosthetic annuloplasty ring present in the mitral position.  5. Tricuspid valve regurgitation is mild to moderate.  6. Known severe aortic stenosis on recent echo- aortic valve not well visualized.  7. The inferior vena cava is dilated in size with <50% respiratory variability, suggesting right atrial pressure of 15 mmHg. FINDINGS  Left Ventricle: Left ventricular ejection fraction, by estimation, is 40 to 45%. The left ventricle has mildly decreased function. The left ventricle demonstrates global hypokinesis. The left  ventricular internal cavity size was normal in size. There is  mild left ventricular hypertrophy of the inferior segment. The interventricular septum is flattened in systole and diastole, consistent with right ventricular pressure and volume overload. Left ventricular diastolic function could not be evaluated. Right Ventricle: The right ventricular size is severely enlarged. No increase in right ventricular wall thickness. Right ventricular systolic function is moderately reduced. Tricuspid regurgitation signal is inadequate for assessing PA pressure. Left Atrium: Left atrial size was not assessed. Right Atrium: Right atrial size was severely dilated. Pericardium: There is no evidence of pericardial effusion. Mitral Valve: The mitral valve has been repaired/replaced. Mild mitral valve regurgitation. There is a 28 mm prosthetic annuloplasty ring present in the mitral position. Tricuspid Valve: The tricuspid valve is normal in structure. Tricuspid valve regurgitation is mild to moderate. No evidence of tricuspid stenosis. Aortic Valve: Known severe aortic stenosis on recent echo- aortic valve not well visualized. Pulmonic Valve: The pulmonic valve was normal in structure. Pulmonic valve regurgitation is not visualized. No evidence of pulmonic stenosis. Aorta: The ascending aorta was not well visualized, the aortic arch was not well visualized and the aortic root was not well visualized. Venous: The inferior vena cava is dilated in size with less than 50% respiratory variability, suggesting right atrial pressure of 15 mmHg. IAS/Shunts: No atrial level shunt detected by color flow Doppler. Additional Comments: Spectral Doppler performed. Color Doppler performed.  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 I will follow him this weekend and re-examine in the morning. If his exam is worsening, I would likely recommend a CT pelvis (contrast if possible) to r/o necrotizing infection  --Ultrasound with no doppler forms identified in right testicle - testicular torsion would be very unlikely in this 57 yo M, and as mentioned above I believe there are views where some degree of blood flow as demonstrated. That being said - hypothetically, if he did have a torsion of unknown duration, would not recommend any sort of acute intervention given his age, comorbidities, and current state.      Irine Seal MD 04/04/2023, 6:06 PM  [facility]  Pager: [phone]

## 2023-04-04 NOTE — Progress Notes (Signed)
eLink Physician-Brief Progress Note Patient Name: Timothy Palmer DOB: 1966-05-01 MRN: [id number]   Date of Service  04/04/2023  HPI/Events of Note  Patient self-extubated. He is not following commands consistently.  eICU Interventions  PCCM ground crew requested to evaluate patient at bedside.        Migdalia Dk 04/04/2023, 10:53 PM

## 2023-04-04 NOTE — Progress Notes (Signed)
RT NOTE: RT X 2 transported patient on ventilator and iNo from room 2M06 to cath lab and back to room 2M06 with no complications.

## 2023-04-04 NOTE — Progress Notes (Addendum)
RN entered room d/t ventilator alarming. Pt's ETT found out of mouth and in pt's hand. OGT partially out of mouth. OGT d/c'd completely. RR-40s, SpO2-96% on RA. Pt able to cough up moderate amount secretions. Mouth suctioned and pt placed on 100% NRB mask with SpO2 increasing to 100%. RT and several RNs to room to assist with situation. Elink RN notified and is notifying Elink MD.

## 2023-04-04 NOTE — Progress Notes (Signed)
ANTICOAGULATION CONSULT NOTE  Pharmacy Consult for Heparin Indication: ACS vs VTE  No Known Allergies  Patient Measurements: Height: 5\' 11"  (180.3 cm) Weight: 105.2 kg (231 lb 14.8 oz) IBW/kg (Calculated) : 75.3 Heparin Dosing Weight: 100 kg  Vital Signs: Temp: 98.6 F (37 C) (09/27 1227) Temp Source: Axillary (09/27 1227) Pulse Rate: 135 (09/27 1225)  Labs: Recent Labs    04/02/23 0441 04/02/23 0454 04/03/23 0459 04/03/23 1555 04/04/23 0210 04/04/23 1011  HGB 10.4*  --  10.7*  --  11.1*  --   HCT 29.6*  --  31.0*  --  31.8*  --   PLT 125*  --  121*  --  128*  --   APTT 74*  --  62* 55* 89* 81*  HEPARINUNFRC  --    < > 0.16*  --  0.32 0.17*  CREATININE  --    < > 2.07* 1.97* 1.24  --    < > = values in this interval not displayed.    Estimated Creatinine Clearance: 83.1 mL/min (by C-G formula based on SCr of 1.24 mg/dL).   Assessment: 62 YOM presenting with shock. Significant cardiac history including MSSA endocarditis, mitral regurgitation, aortic stenosis. Also hx of ESRD and DVT. Presented with elevated troponins and ECHO showed moderate RV dysfunction and RV enlargement. Pharmacy consulted to dose heparin to rule out PE with potential ACS. No AC PTA  Heparin held for [facility] cath, restarting 8 hours post sheath removal. aPTT previously therapeutic at 81 seconds on 2100 units/hr. No signs of bleeding per RN. CBC stable.   Goal of Therapy:  Heparin level 0.3-0.7 units/ml aPTT 66-102 seconds Monitor platelets by anticoagulation protocol: Yes   Plan:  Restart heparin infusion at 2100 units/hr at 2000 Check aPTT in 8 hours and daily while on heparin Continue to monitor H&H and platelets  Thank you for involving pharmacy in the patient's care.   Theotis Burrow, PharmD PGY1 Acute Care Pharmacy Resident  04/04/2023 3:39 PM

## 2023-04-04 NOTE — Progress Notes (Signed)
ANTICOAGULATION CONSULT NOTE  Pharmacy Consult for Heparin Indication: ACS vs VTE  No Known Allergies  Patient Measurements: Height: 5\' 11"  (180.3 cm) Weight: 107.9 kg (237 lb 14 oz) IBW/kg (Calculated) : 75.3 Heparin Dosing Weight: 100 kg  Vital Signs: Temp: 97 F (36.1 C) (09/26 2349) Temp Source: Axillary (09/26 2349) Pulse Rate: 85 (09/26 2000)  Labs: Recent Labs    04/02/23 0441 04/02/23 0454 04/02/23 1740 04/03/23 0459 04/03/23 1555 04/04/23 0210  HGB 10.4*  --   --  10.7*  --  11.1*  HCT 29.6*  --   --  31.0*  --  31.8*  PLT 125*  --   --  121*  --  128*  APTT 74*  --   --  62* 55* 89*  HEPARINUNFRC  --  0.24*  --  0.16*  --  0.32  CREATININE  --  2.26*   < > 2.07* 1.97* 1.24   < > = values in this interval not displayed.    Estimated Creatinine Clearance: 84.1 mL/min (by C-G formula based on SCr of 1.24 mg/dL).   Assessment: 53 YOM presenting with shock. Significant cardiac history including MSSA endocarditis, mitral regurgitation, aortic stenosis. Also hx of ESRD and DVT. Presented with elevated troponins and ECHO showed moderate RV dysfunction and RV enlargement. Pharmacy consulted to dose heparin to rule out PE with potential ACS. No AC PTA  aPTT therapeutic at 89 seconds on 2100 units/hr. No issues with infusion or signs of bleeding in chart. CBC stable.   Goal of Therapy:  Heparin level 0.3-0.7 units/ml aPTT 66-102 seconds Monitor platelets by anticoagulation protocol: Yes   Plan:  Continue heparin infusion at 2100 units/hr Check confirmatory aPTT in 8 hours and daily while on heparin Continue to monitor H&H and platelets  Thank you for allowing pharmacy to be a part of this patient's care.  Arabella Merles, PharmD. Clinical Pharmacist 04/04/2023 3:07 AM

## 2023-04-04 NOTE — Progress Notes (Signed)
Pt self extubated in bilateral wrist restraints and safety mitts. Elink MD notified and ground team came to bedside to evaluate. Pt in no distress, SpO2 96% on RA and 100% on NRB. RR 45. Pt follows commands, but not consistently. Pt stated his name but will not answer other questions at this time. Pt has strong spontaneous cough, but will not cough to commands. Obtained ABG. CVP 13. Will continue to monitor closely.

## 2023-04-04 NOTE — Progress Notes (Addendum)
Advanced Heart Failure Rounding Note  PCP-Cardiologist: Little Ishikawa, MD   Subjective:   9/26 iNO started.  Overnight vomiting.  400 cc out.   On CRRT. Unable to pull much due to hypotension. CVP 13-14.   Currently on milrinone 0.125, norepi @ 10, and vaso 0.04. CO-OX 84%.  WBC trending up to 25.   Awake following commands.  Objective:   Weight Range: 105.2 kg Body mass index is 32.35 kg/m.   Vital Signs:   Temp:  [94.4 F (34.7 C)-100.4 F (38 C)] 98.3 F (36.8 C) (09/27 0750) Pulse Rate:  [78-134] 89 (09/27 0900) Resp:  [11-34] 30 (09/27 0900) SpO2:  [91 %-100 %] 96 % (09/27 0900) Arterial Line BP: (92-174)/(37-77) 115/52 (09/27 0900) FiO2 (%):  [30 %] 30 % (09/27 0732) Weight:  [105.2 kg] 105.2 kg (09/27 0500) Last BM Date :  (pta)  Weight change: Filed Weights   04/02/23 0314 04/03/23 0500 04/04/23 0500  Weight: 108.5 kg 107.9 kg 105.2 kg    Intake/Output:   Intake/Output Summary (Last 24 hours) at 04/04/2023 1008 Last data filed at 04/04/2023 0903 Gross per 24 hour  Intake 2511.46 ml  Output 4282 ml  Net -1770.54 ml    CVP 13-14  Physical Exam    General:  Intubated/CRRT HEENT: ETT Neck: Supple. JVP difficult to asssess. .Carotids 2+ bilat; no bruits. No lymphadenopathy or thyromegaly appreciated.LIJ   Cor: PMI nondisplaced. Regular rate & rhythm. No rubs, gallops or murmurs. Lungs: Clear Abdomen: Soft, nontender, distended. No hepatosplenomegaly. No bruits or masses. Good bowel sounds. Extremities: No cyanosis, clubbing, rash, edema Neuro: Awake following commands.   Telemetry   SR 6-70s   EKG    N/A   Labs    CBC Recent Labs    04/03/23 0459 04/04/23 0210  WBC 20.2* 24.9*  HGB 10.7* 11.1*  HCT 31.0* 31.8*  MCV 81.4 81.1  PLT 121* 128*   Basic Metabolic Panel Recent Labs    16/10/96 0459 04/03/23 1555 04/04/23 0210  NA 135 135 132*  K 3.2* 3.8 3.4*  CL 104 99 98  CO2 23 21* 22  GLUCOSE 169* 225* 235*   BUN 27* 29* 31*  CREATININE 2.07* 1.97* 1.24  CALCIUM 7.8* 8.1* 8.4*  MG 2.5*  --  2.5*  PHOS 2.6 2.3* 2.4*   Liver Function Tests Recent Labs    04/03/23 1124 04/03/23 1555 04/04/23 0210  AST 209*  --  151*  ALT 276*  --  240*  ALKPHOS 167*  --  178*  BILITOT 16.5*  --  19.0*  PROT 7.8  --  7.8  ALBUMIN 2.1* 1.9* 2.0*  2.0*   No results for input(s): "LIPASE", "AMYLASE" in the last 72 hours. Cardiac Enzymes No results for input(s): "CKTOTAL", "CKMB", "CKMBINDEX", "TROPONINI" in the last 72 hours.  BNP: BNP (last 3 results) Recent Labs    03/29/23 1158  BNP 2,670.6*    ProBNP (last 3 results) No results for input(s): "PROBNP" in the last 8760 hours.   D-Dimer No results for input(s): "DDIMER" in the last 72 hours. Hemoglobin A1C No results for input(s): "HGBA1C" in the last 72 hours. Fasting Lipid Panel No results for input(s): "CHOL", "HDL", "LDLCALC", "TRIG", "CHOLHDL", "LDLDIRECT" in the last 72 hours. Thyroid Function Tests No results for input(s): "TSH", "T4TOTAL", "T3FREE", "THYROIDAB" in the last 72 hours.  Invalid input(s): "FREET3"  Other results:   Imaging    DG Chest Port 1 View  Result Date: 04/04/2023  CLINICAL DATA:  Acute respiratory failure, aspiration in the airway. Ileus. EXAM: PORTABLE CHEST 1 VIEW, ABDOMEN ONE VIEW COMPARISON:  03/31/2023, 04/01/2023. FINDINGS: The heart is enlarged and the mediastinal contour is stable. A prosthetic cardiac valve is noted. There is atherosclerotic calcification of the aorta. The pulmonary vasculature is mildly distended. Focal airspace disease is noted in the suprahilar region on the right. Mild atelectasis is present at the right lung base. No effusion or pneumothorax. A right internal jugular central venous catheter appear stable. An enteric tube courses over the left upper quadrant. The endotracheal tube terminates 4.2 cm above the carina. Surgical clips are noted in the cervical soft tissues. There is  gaseous distention of the stomach with an enteric tube in place. Mildly distended loops of small bowel are noted in the abdomen measuring up to 3.8 cm. Rim calcified structure is noted in the right lower quadrant, compatible with known rim calcified renal cyst. No acute osseous abnormality is seen. IMPRESSION: 1. Cardiomegaly with mildly distended pulmonary vascularity. 2. Mild airspace disease in the suprahilar region on the right. 3. Atelectasis at the right lung base. Support apparatus as described above. 4. Gaseous distention of the stomach with enteric tube in place. 5. Distended loops of small bowel in the abdomen measuring up to 4.2 cm, possible ileus or obstruction. Electronically Signed   By: Thornell Sartorius M.D.   On: 04/04/2023 03:36   DG Abd 1 View  Result Date: 04/04/2023 CLINICAL DATA:  Acute respiratory failure, aspiration in the airway. Ileus. EXAM: PORTABLE CHEST 1 VIEW, ABDOMEN ONE VIEW COMPARISON:  03/31/2023, 04/01/2023. FINDINGS: The heart is enlarged and the mediastinal contour is stable. A prosthetic cardiac valve is noted. There is atherosclerotic calcification of the aorta. The pulmonary vasculature is mildly distended. Focal airspace disease is noted in the suprahilar region on the right. Mild atelectasis is present at the right lung base. No effusion or pneumothorax. A right internal jugular central venous catheter appear stable. An enteric tube courses over the left upper quadrant. The endotracheal tube terminates 4.2 cm above the carina. Surgical clips are noted in the cervical soft tissues. There is gaseous distention of the stomach with an enteric tube in place. Mildly distended loops of small bowel are noted in the abdomen measuring up to 3.8 cm. Rim calcified structure is noted in the right lower quadrant, compatible with known rim calcified renal cyst. No acute osseous abnormality is seen. IMPRESSION: 1. Cardiomegaly with mildly distended pulmonary vascularity. 2. Mild airspace  disease in the suprahilar region on the right. 3. Atelectasis at the right lung base. Support apparatus as described above. 4. Gaseous distention of the stomach with enteric tube in place. 5. Distended loops of small bowel in the abdomen measuring up to 4.2 cm, possible ileus or obstruction. Electronically Signed   By: Thornell Sartorius M.D.   On: 04/04/2023 03:36   ECHOCARDIOGRAM LIMITED  Result Date: 04/03/2023    ECHOCARDIOGRAM LIMITED REPORT   Patient Name:   Timothy Palmer [facility] Date of Exam: 04/03/2023 Medical Rec #:  [id number]      Height:       71.0 in Accession #:    [id number]     Weight:       237.9 lb Date of Birth:  [birth year]     BSA:          2.270 m Patient Age:    56 years       BP:  115/46 mmHg Patient Gender: M              HR:           77 bpm. Exam Location:  Inpatient Procedure: Limited Echo, Limited Color Doppler and Cardiac Doppler Indications:    CHF-Acute Diastolic I50.31  History:        Patient has prior history of Echocardiogram examinations, most                 recent 03/29/2023. CHF, Shock, Mitral Valve Disease and Aortic                 Valve Disease, Arrythmias:Atrial Fibrillation and Tachycardia,                 Signs/Symptoms:Hypotension; Risk Factors:Non-Smoker.                  Mitral Valve: 28 mm prosthetic annuloplasty ring valve is                 present in the mitral position.  Sonographer:    Aron Baba Referring Phys: [id number] CHRISTOPHER L SCHUMANN IMPRESSIONS  1. Left ventricular ejection fraction, by estimation, is 40 to 45%. The left ventricle has mildly decreased function. The left ventricle demonstrates global hypokinesis. There is mild left ventricular hypertrophy of the inferior segment. Left ventricular diastolic function could not be evaluated. There is the interventricular septum is flattened in systole and diastole, consistent with right ventricular pressure and volume overload.  2. Right ventricular systolic function is moderately reduced. The right  ventricular size is severely enlarged. Tricuspid regurgitation signal is inadequate for assessing PA pressure.  3. Right atrial size was severely dilated.  4. The mitral valve has been repaired/replaced. Mild mitral valve regurgitation. There is a 28 mm prosthetic annuloplasty ring present in the mitral position.  5. Tricuspid valve regurgitation is mild to moderate.  6. Known severe aortic stenosis on recent echo- aortic valve not well visualized.  7. The inferior vena cava is dilated in size with <50% respiratory variability, suggesting right atrial pressure of 15 mmHg. FINDINGS  Left Ventricle: Left ventricular ejection fraction, by estimation, is 40 to 45%. The left ventricle has mildly decreased function. The left ventricle demonstrates global hypokinesis. The left ventricular internal cavity size was normal in size. There is  mild left ventricular hypertrophy of the inferior segment. The interventricular septum is flattened in systole and diastole, consistent with right ventricular pressure and volume overload. Left ventricular diastolic function could not be evaluated. Right Ventricle: The right ventricular size is severely enlarged. No increase in right ventricular wall thickness. Right ventricular systolic function is moderately reduced. Tricuspid regurgitation signal is inadequate for assessing PA pressure. Left Atrium: Left atrial size was not assessed. Right Atrium: Right atrial size was severely dilated. Pericardium: There is no evidence of pericardial effusion. Mitral Valve: The mitral valve has been repaired/replaced. Mild mitral valve regurgitation. There is a 28 mm prosthetic annuloplasty ring present in the mitral position. Tricuspid Valve: The tricuspid valve is normal in structure. Tricuspid valve regurgitation is mild to moderate. No evidence of tricuspid stenosis. Aortic Valve: Known severe aortic stenosis on recent echo- aortic valve not well visualized. Pulmonic Valve: The pulmonic valve was  normal in structure. Pulmonic valve regurgitation is not visualized. No evidence of pulmonic stenosis. Aorta: The ascending aorta was not well visualized, the aortic arch was not well visualized and the aortic root was not well visualized. Venous: The inferior vena cava is dilated  in size with less than 50% respiratory variability, suggesting right atrial pressure of 15 mmHg. IAS/Shunts: No atrial level shunt detected by color flow Doppler. Additional Comments: Spectral Doppler performed. Color Doppler performed.  LEFT VENTRICLE PLAX 2D LVIDd:         4.80 cm LVIDs:         3.80 cm LV PW:         1.20 cm LV IVS:        0.90 cm  RIGHT VENTRICLE RV S prime:     10.30 cm/s TAPSE (M-mode): 1.3 cm RIGHT ATRIUM           Index RA Area:     26.40 cm RA Volume:   93.40 ml  41.15 ml/m Kardie Tobb DO Electronically signed by Thomasene Ripple DO Signature Date/Time: 04/03/2023/5:15:10 PM    Final      Medications:     Scheduled Medications:  calcitRIOL  0.75 mcg Per Tube Q T,Th,Sat-1800   Chlorhexidine Gluconate Cloth  6 each Topical Daily   docusate  100 mg Per Tube BID   insulin aspart  0-15 Units Subcutaneous Q4H   multivitamin  1 tablet Per Tube QHS   mouth rinse  15 mL Mouth Rinse Q2H   pantoprazole (PROTONIX) IV  40 mg Intravenous Daily   polyethylene glycol  17 g Per Tube BID   sennosides  10 mL Per Tube BID    Infusions:   prismasol BGK 4/2.5 400 mL/hr at 04/04/23 0648    prismasol BGK 4/2.5 400 mL/hr at 04/04/23 0648   sodium chloride     ceFEPime (MAXIPIME) IV Stopped (04/04/23 0743)   feeding supplement (PIVOT 1.5 CAL) Stopped (04/04/23 0110)   heparin 2,100 Units/hr (04/04/23 0900)   milrinone 0.125 mcg/kg/min (04/04/23 0900)   norepinephrine (LEVOPHED) Adult infusion 10 mcg/min (04/04/23 0900)   prismasol BGK 4/2.5 1,500 mL/hr at 04/04/23 0902   vasopressin 0.04 Units/min (04/04/23 0900)    PRN Medications: fentaNYL, fentaNYL (SUBLIMAZE) injection, Gerhardt's butt cream, heparin, lip  balm, midazolam, mouth rinse, prochlorperazine    Patient Profile   57 y/o male with ESRD, severe AS and PAH. Admitted with respiratory failure.   Assessment/Plan  Acute on chronic combined systolic and diastolic heart failure>>cardiogenic shock - Lactic acid 11.9 on admission, now down trending. Last 2 03/31/23 - RHC 9/24: Cardiogenic shock with moderate to severe PAH and end-stage RV failure. RA 17, PA 77/25 (42), Fick CO/CI 3.3/1.5, TD CO/CI 3.6/1.6. PVR 9 WU, PAPi 3. PAO2 35% - Now on milrinone 0.125, norepi @ 10, and vaso 0.04. Co-ox 84%  - CVP 13-14. On CRRT  - GDMT limited with ESRD, consider SGLT2i? - Not a candidate for advanced therapies with co morbidities (ESRD, PAH) - Plan  for RHC today.    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    WCT - WCT on admission suspect secondary to electrolyte  imbalances/wide QTc -Keep K > 4 & Mg > 2    AFL - noted this admission, may need amio - suspect 2/2 milrinone? - Maintaining SR.  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 ID WBC  25.  Vomiting earlier this morning. Concerned about aspiration.    Length of Stay: 6  Amy Clegg, NP  04/04/2023, 10:08 AM  Advanced Heart Failure Team Pager [phone] (M-F; 7a - 5p)  Please contact [facility] for night-coverage after hours (5p -7a ) and weekends on [email or web address]   Agree with above.   Awake on vent. Moves all 4.  Doesn't follow commands reliably.   WBC up and had vomiting overnight  On CVVHD. Now on NO at 40, milrinone 0.125 and VP. Co-ox 84% CVP 14  General:  Ill-appearing. On vent awake  HEENT: normal +ETT Neck: supple. HD cath   Cor: Regular rate & rhythm. 3/6 AS Lungs: clear Abdomen: soft, nontender, nondistended. No hepatosplenomegaly. No bruits or masses. Good bowel sounds. Extremities: no cyanosis, clubbing, rash, edema Neuro: awake not following commands reliably   He remains critically ill. Will repeat RHC to see if PAH modifiable or fixed on iNO. Dr. Lynnette Caffey following for possible TAVR but suspect he may be too high risk.   RHC discussed with his sister by phone. Given rising WBC will not leave swan in at this time.   CRITICAL CARE Performed by: Arvilla Meres  Total critical care time: 40 minutes  Critical care time was exclusive of separately billable procedures and treating other patients.  Critical care was necessary to treat or prevent imminent or life-threatening deterioration.  Critical care was time spent personally by me (independent of midlevel providers or residents) on the following activities: development of treatment plan with patient and/or surrogate as well as nursing, discussions with consultants, evaluation of patient's response to treatment, examination of patient, obtaining history from patient or surrogate, ordering and performing treatments and interventions, ordering and review of laboratory studies, ordering and review of radiographic studies, pulse oximetry and re-evaluation of patient's condition.  Arvilla Meres, MD  11:10 AM

## 2023-04-04 NOTE — Plan of Care (Signed)
  Problem: Education: Goal: Knowledge of General Education information will improve Description: Including pain rating scale, medication(s)/side effects and non-pharmacologic comfort measures Outcome: Progressing   Problem: Health Behavior/Discharge Planning: Goal: Ability to manage health-related needs will improve Outcome: Progressing   Problem: Clinical Measurements: Goal: Ability to maintain clinical measurements within normal limits will improve Outcome: Progressing Goal: Will remain free from infection Outcome: Progressing Goal: Diagnostic test results will improve Outcome: Progressing Goal: Respiratory complications will improve Outcome: Progressing Goal: Cardiovascular complication will be avoided Outcome: Progressing   Problem: Activity: Goal: Risk for activity intolerance will decrease Outcome: Progressing   Problem: Coping: Goal: Level of anxiety will decrease Outcome: Progressing   Problem: Pain Managment: Goal: General experience of comfort will improve Outcome: Progressing   Problem: Safety: Goal: Ability to remain free from injury will improve Outcome: Progressing   Problem: Skin Integrity: Goal: Risk for impaired skin integrity will decrease Outcome: Progressing   Problem: Safety: Goal: Non-violent Restraint(s) Outcome: Progressing   Problem: Education: Goal: Ability to describe self-care measures that may prevent or decrease complications (Diabetes Survival Skills Education) will improve Outcome: Progressing Goal: Individualized Educational Video(s) Outcome: Progressing   Problem: Coping: Goal: Ability to adjust to condition or change in health will improve Outcome: Progressing   Problem: Fluid Volume: Goal: Ability to maintain a balanced intake and output will improve Outcome: Progressing   Problem: Health Behavior/Discharge Planning: Goal: Ability to identify and utilize available resources and services will improve Outcome:  Progressing Goal: Ability to manage health-related needs will improve Outcome: Progressing   Problem: Metabolic: Goal: Ability to maintain appropriate glucose levels will improve Outcome: Progressing   Problem: Nutritional: Goal: Progress toward achieving an optimal weight will improve Outcome: Progressing   Problem: Skin Integrity: Goal: Risk for impaired skin integrity will decrease Outcome: Progressing   Problem: Tissue Perfusion: Goal: Adequacy of tissue perfusion will improve Outcome: Progressing   Problem: Education: Goal: Understanding of CV disease, CV risk reduction, and recovery process will improve Outcome: Progressing Goal: Individualized Educational Video(s) Outcome: Progressing   Problem: Activity: Goal: Ability to return to baseline activity level will improve Outcome: Progressing   Problem: Cardiovascular: Goal: Ability to achieve and maintain adequate cardiovascular perfusion will improve Outcome: Progressing Goal: Vascular access site(s) Level 0-1 will be maintained Outcome: Progressing   Problem: Health Behavior/Discharge Planning: Goal: Ability to safely manage health-related needs after discharge will improve Outcome: Progressing   Problem: Nutrition: Goal: Adequate nutrition will be maintained Outcome: Not Progressing   Problem: Elimination: Goal: Will not experience complications related to bowel motility Outcome: Not Progressing   Problem: Nutritional: Goal: Maintenance of adequate nutrition will improve Outcome: Not Progressing

## 2023-04-04 NOTE — Progress Notes (Signed)
   RHC done today.   Continues with severe PAH despite milrinone and high-dose iNO.   Suspect severe/end-stage PAH/cor pulmonale is combination of underlying high flow from AVF as well as WHO Group 1,2 and potentially 3 PH. Given only moderately elevated CO, I do not think ligating his AVF will reduce his PA pressures enough to change his outcome. At this point, would continue medical treatment and if he fails vent wean would suggest Palliative Care involvement.   Can continue milrinone and iNO as needed to facilitate extubation but would otherwise wean off.   The AHF team will sign off.  Arvilla Meres, MD  12:25 PM

## 2023-04-04 NOTE — Progress Notes (Addendum)
[name] NEPHROLOGY PROGRESS NOTE  Assessment/ Plan: Pt is a 57 y.o. yo male   OP HD: [facility] (on hd since 2005)  4.5hr  450/1.5    119kg  2/2 bath  AVF LUA   Heparin 5000 - last OP HD 9/19, post wt 118.4kg  - rocaltrol 0.75 mcg po tts - sensipar 150 mg po tts - no esa (last Hb 11.6 on 9/19)  # Shock septic vs cardiogenic - on IV abx and pressor support per PCCM.  # Severe AS / RV dysfunction/V tach: Status post TEE on 9/23 with no vegetation however so severe AAS, severe RV systolic dysfunction with elevated pulmonary pressure.  RHC on 9/24 with cardiogenic shock, moderate to severe PAH and end-stage RV failure.  On milrinone.  Plan for right heart cath today by heart failure team.  # ESRD - [facility]. Had not missed HD. CRRT started 9/21.  Potassium bath 4K because of hypokalemia.  Judicious UF because of right ventricular failure.  Try UF 50-100 cc/per hour if tolerated by BP.  # Hyperkalemia initially which was improved with CRRT and now adjusting potassium bath for hypokalemia.  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  Resume binders when he starts eating.  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  No vegetation and TEE.  # VDRF: Likely extubating soon.  # Hypophosphatemia: Replete K-Phos.  Subjective: Seen and examined in ICU.  Weaning sedation.  Tolerating CRRT well, no issue.  Discussed with ICU nurse.  Plan for RHC today. Objective Vital signs in last 24 hours: Vitals:   04/04/23 0815 04/04/23 0830 04/04/23 0845 04/04/23 0900  BP:      Pulse: 89 97 88 89  Resp: (!) 27 (!) 27 (!) 29 (!) 30  Temp:      TempSrc:      SpO2: 96% 97% 97% 96%  Weight:      Height:       Weight change: -2.7 kg  Intake/Output Summary (Last 24 hours) at 04/04/2023 1102 Last data filed at 04/04/2023 1000 Gross per 24 hour  Intake 2417.91 ml  Output 4145 ml  Net -1727.09 ml        Labs: RENAL PANEL Recent Labs  Lab 04/01/23 0417 04/01/23 1542 04/01/23 1718 04/02/23 0441 04/02/23 0454 04/02/23 1740 04/03/23 0459 04/03/23 1124 04/03/23 1555 04/04/23 0210  NA 131* 136   < >  --  132* 135 135  --  135 132*  K 3.5 3.4*   < >  --  2.8* 3.2* 3.2*  --  3.8 3.4*  CL 97* 97*  --   --  99 103 104  --  99 98  CO2 19* 21*  --   --  22 23 23   --  21* 22  GLUCOSE 221* 145*  --   --  181* 167* 169*  --  225* 235*  BUN 35* 33*  --   --  33* 26* 27*  --  29* 31*  CREATININE 2.80* 2.42*  --   --  2.26* 2.10* 2.07*  --  1.97* 1.24  CALCIUM 7.1* 7.7*  --   --  7.4* 7.4* 7.8*  --  8.1* 8.4*  MG 2.6* 2.6*  --  2.6*  --   --  2.5*  --   --  2.5*  PHOS 3.7 3.1  --  2.7 2.7 2.5 2.6  --  2.3* 2.4*  ALBUMIN 1.8*  1.8* 1.8*  --   --  1.7* 1.7* 1.7* 2.1* 1.9* 2.0*  2.0*   < > = values in this interval not displayed.    Liver Function Tests: Recent Labs  Lab 04/01/23 0417 04/01/23 1542 04/03/23 1124 04/03/23 1555 04/04/23 0210  AST 1,061*  --  209*  --  151*  ALT 550*  --  276*  --  240*  ALKPHOS 155*  --  167*  --  178*  BILITOT 9.1*  --  16.5*  --  19.0*  PROT 7.2  --  7.8  --  7.8  ALBUMIN 1.8*  1.8*   < > 2.1* 1.9* 2.0*  2.0*   < > = values in this interval not displayed.   Recent Labs  Lab 03/29/23 1158  LIPASE 41  AMYLASE 71   No results for input(s): "AMMONIA" in the last 168 hours. CBC: Recent Labs    04/01/23 0417 04/01/23 1718 04/02/23 0441 04/03/23 0459 04/04/23 0210  HGB 10.5* 12.6* 10.4* 10.7* 11.1*  MCV 82.4  --  82.2 81.4 81.1    Cardiac Enzymes: No results for input(s): "CKTOTAL", "CKMB", "CKMBINDEX", "TROPONINI" in the last 168 hours. CBG: Recent Labs  Lab 04/03/23 1601 04/03/23 1923 04/03/23 2333 04/04/23 0310 04/04/23 0749  GLUCAP 208* 214* 195* 193* 159*    Iron Studies: No results for input(s): "IRON", "TIBC", "TRANSFERRIN", "FERRITIN" in the last 72 hours. Studies/Results: DG Chest Port 1 View  Result Date:  04/04/2023 CLINICAL DATA:  Acute respiratory failure, aspiration in the airway. Ileus. EXAM: PORTABLE CHEST 1 VIEW, ABDOMEN ONE VIEW COMPARISON:  03/31/2023, 04/01/2023. FINDINGS: The heart is enlarged and the mediastinal contour is stable. A prosthetic cardiac valve is noted. There is atherosclerotic calcification of the aorta. The pulmonary vasculature is mildly distended. Focal airspace disease is noted in the suprahilar region on the right. Mild atelectasis is present at the right lung base. No effusion or pneumothorax. A right internal jugular central venous catheter appear stable. An enteric tube courses over the left upper quadrant. The endotracheal tube terminates 4.2 cm above the carina. Surgical clips are noted in the cervical soft tissues. There is gaseous distention of the stomach with an enteric tube in place. Mildly distended loops of small bowel are noted in the abdomen measuring up to 3.8 cm. Rim calcified structure is noted in the right lower quadrant, compatible with known rim calcified renal cyst. No acute osseous abnormality is seen. IMPRESSION: 1. Cardiomegaly with mildly distended pulmonary vascularity. 2. Mild airspace disease in the suprahilar region on the right. 3. Atelectasis at the right lung base. Support apparatus as described above. 4. Gaseous distention of the stomach with enteric tube in place. 5. Distended loops of small bowel in the abdomen measuring up to 4.2 cm, possible ileus or obstruction. Electronically Signed   By: Thornell Sartorius M.D.   On: 04/04/2023 03:36   DG Abd 1 View  Result Date: 04/04/2023 CLINICAL DATA:  Acute respiratory failure, aspiration in the airway. Ileus. EXAM: PORTABLE CHEST 1 VIEW, ABDOMEN ONE VIEW COMPARISON:  03/31/2023, 04/01/2023. FINDINGS: The heart is enlarged and the mediastinal contour is stable. A prosthetic cardiac valve is noted. There is atherosclerotic calcification of the aorta. The pulmonary vasculature is mildly distended. Focal airspace  disease is noted in the suprahilar region on the right. Mild atelectasis is present at the right lung base. No effusion or pneumothorax. A right internal jugular central venous catheter appear stable. An enteric  tube courses over the left upper quadrant. The endotracheal tube terminates 4.2 cm above the carina. Surgical clips are noted in the cervical soft tissues. There is gaseous distention of the stomach with an enteric tube in place. Mildly distended loops of small bowel are noted in the abdomen measuring up to 3.8 cm. Rim calcified structure is noted in the right lower quadrant, compatible with known rim calcified renal cyst. No acute osseous abnormality is seen. IMPRESSION: 1. Cardiomegaly with mildly distended pulmonary vascularity. 2. Mild airspace disease in the suprahilar region on the right. 3. Atelectasis at the right lung base. Support apparatus as described above. 4. Gaseous distention of the stomach with enteric tube in place. 5. Distended loops of small bowel in the abdomen measuring up to 4.2 cm, possible ileus or obstruction. Electronically Signed   By: Thornell Sartorius M.D.   On: 04/04/2023 03:36   ECHOCARDIOGRAM LIMITED  Result Date: 04/03/2023    ECHOCARDIOGRAM LIMITED REPORT   Patient Name:   [facility] Date of Exam: 04/03/2023 Medical Rec #:  [id number]      Height:       71.0 in Accession #:    [id number]     Weight:       237.9 lb Date of Birth:  02-19-1966     BSA:          2.270 m Patient Age:    56 years       BP:           115/46 mmHg Patient Gender: M              HR:           77 bpm. Exam Location:  Inpatient Procedure: Limited Echo, Limited Color Doppler and Cardiac Doppler Indications:    CHF-Acute Diastolic I50.31  History:        Patient has prior history of Echocardiogram examinations, most                 recent 03/29/2023. CHF, Shock, Mitral Valve Disease and Aortic                 Valve Disease, Arrythmias:Atrial Fibrillation and Tachycardia,                  Signs/Symptoms:Hypotension; Risk Factors:Non-Smoker.                  Mitral Valve: 28 mm prosthetic annuloplasty ring valve is                 present in the mitral position.  Sonographer:    Aron Baba Referring Phys: [id number] CHRISTOPHER L SCHUMANN IMPRESSIONS  1. Left ventricular ejection fraction, by estimation, is 40 to 45%. The left ventricle has mildly decreased function. The left ventricle demonstrates global hypokinesis. There is mild left ventricular hypertrophy of the inferior segment. Left ventricular diastolic function could not be evaluated. There is the interventricular septum is flattened in systole and diastole, consistent with right ventricular pressure and volume overload.  2. Right ventricular systolic function is moderately reduced. The right ventricular size is severely enlarged. Tricuspid regurgitation signal is inadequate for assessing PA pressure.  3. Right atrial size was severely dilated.  4. The mitral valve has been repaired/replaced. Mild mitral valve regurgitation. There is a 28 mm prosthetic annuloplasty ring present in the mitral position.  5. Tricuspid valve regurgitation is mild to moderate.  6. Known severe aortic stenosis on recent echo- aortic valve not well visualized.  7. The inferior vena cava is dilated in size with <50% respiratory variability, suggesting right atrial pressure of 15 mmHg. FINDINGS  Left Ventricle: Left ventricular ejection fraction, by estimation, is 40 to 45%. The left ventricle has mildly decreased function. The left ventricle demonstrates global hypokinesis. The left ventricular internal cavity size was normal in size. There is  mild left ventricular hypertrophy of the inferior segment. The interventricular septum is flattened in systole and diastole, consistent with right ventricular pressure and volume overload. Left ventricular diastolic function could not be evaluated. Right Ventricle: The right ventricular size is severely enlarged. No increase in  right ventricular wall thickness. Right ventricular systolic function is moderately reduced. Tricuspid regurgitation signal is inadequate for assessing PA pressure. Left Atrium: Left atrial size was not assessed. Right Atrium: Right atrial size was severely dilated. Pericardium: There is no evidence of pericardial effusion. Mitral Valve: The mitral valve has been repaired/replaced. Mild mitral valve regurgitation. There is a 28 mm prosthetic annuloplasty ring present in the mitral position. Tricuspid Valve: The tricuspid valve is normal in structure. Tricuspid valve regurgitation is mild to moderate. No evidence of tricuspid stenosis. Aortic Valve: Known severe aortic stenosis on recent echo- aortic valve not well visualized. Pulmonic Valve: The pulmonic valve was normal in structure. Pulmonic valve regurgitation is not visualized. No evidence of pulmonic stenosis. Aorta: The ascending aorta was not well visualized, the aortic arch was not well visualized and the aortic root was not well visualized. Venous: The inferior vena cava is dilated in size with less than 50% respiratory variability, suggesting right atrial pressure of 15 mmHg. IAS/Shunts: No atrial level shunt detected by color flow Doppler. Additional Comments: Spectral Doppler performed. Color Doppler performed.  LEFT VENTRICLE PLAX 2D LVIDd:         4.80 cm LVIDs:         3.80 cm LV PW:         1.20 cm LV IVS:        0.90 cm  RIGHT VENTRICLE RV S prime:     10.30 cm/s TAPSE (M-mode): 1.3 cm RIGHT ATRIUM           Index RA Area:     26.40 cm RA Volume:   93.40 ml  41.15 ml/m Kardie Tobb DO Electronically signed by Thomasene Ripple DO Signature Date/Time: 04/03/2023/5:15:10 PM    Final     Medications: Infusions:   prismasol BGK 4/2.5 400 mL/hr at 04/04/23 0648    prismasol BGK 4/2.5 400 mL/hr at 04/04/23 0648   sodium chloride     sodium chloride     ceFEPime (MAXIPIME) IV Stopped (04/04/23 0743)   feeding supplement (PIVOT 1.5 CAL) Stopped (04/04/23  0110)   heparin 2,100 Units/hr (04/04/23 1000)   milrinone 0.125 mcg/kg/min (04/04/23 1000)   norepinephrine (LEVOPHED) Adult infusion 10 mcg/min (04/04/23 1000)   prismasol BGK 4/2.5 1,500 mL/hr at 04/04/23 0902   vasopressin 0.04 Units/min (04/04/23 1000)    Scheduled Medications:  calcitRIOL  0.75 mcg Per Tube Q T,Th,Sat-1800   Chlorhexidine Gluconate Cloth  6 each Topical Daily   docusate  100 mg Per Tube BID   insulin aspart  0-15 Units Subcutaneous Q4H   multivitamin  1 tablet Per Tube QHS   mouth rinse  15 mL Mouth Rinse Q2H   pantoprazole (PROTONIX) IV  40 mg Intravenous Daily   polyethylene glycol  17 g Per Tube BID   sennosides  10 mL Per Tube BID    have reviewed  scheduled and prn medications.  Physical Exam: General: Ill looking male, intubated, sedated. Heart:RRR, s1s2 nl Lungs: Coarse breath sound bilateral. Abdomen:soft, Non-tender, non-distended Extremities: Trace peripheral edema Dialysis Access: L AVF.  RIJ temp HD cath placed on 9/21.   Kelise Kuch Jaynie Collins 04/04/2023,11:02 AM  LOS: 6 days

## 2023-04-04 NOTE — Consult Note (Addendum)
WOC Nurse Consult Note: Reason for Consult: Consult requested for scrotum. Significant area of brown nectotic skin to the anterior area which is fluctuant with mod amt tan drainage and generalized edema. This complex medical condition is beyond the scope of practice for WOC nurses. Discussed wound appearance with Critical care team physician and he will request a Urology consult.  Please refer to their team for further plan of care.  Please re-consult if further assistance is needed.  Thank-you,  Cammie Mcgee MSN, RN, CWOCN, [place], CNS [phone]

## 2023-04-04 NOTE — Progress Notes (Signed)
Called to bedside s/p self extubation  BP 139/70   Pulse 95   Temp 98.9 F (37.2 C) (Axillary)   Resp (!) 45   Ht 5\' 11"  (1.803 m)   Wt 105.2 kg   SpO2 100%   BMI 32.35 kg/m   Currently on 100% NRB Current infusions  heparin 2,100 Units/hr (04/04/23 2200)   milrinone 0.125 mcg/kg/min (04/04/23 2200)   norepinephrine (LEVOPHED) Adult infusion 20 mcg/min (04/04/23 2200)   prismasol BGK 4/2.5 1,500 mL/hr at 04/04/23 2203   vasopressin 0.04 Units/min (04/04/23 2200)   Exam  General awake. Weak but nods approp and intermittently follows commands HENT NCAT neck larg Pulm shallow rapid resp effort. Dec'd bases.  ABG    Component Value Date/Time   PHART 7.357 04/04/2023 2246   PCO2ART 44.8 04/04/2023 2246   PO2ART 191 (H) 04/04/2023 2246   HCO3 25.1 04/04/2023 2246   TCO2 26 04/04/2023 2246   ACIDBASEDEF 1.0 04/04/2023 2246   O2SAT 100 04/04/2023 2246  Card RRR. Current CVP 11 Abd distended hypoactive  Neuro awake. Interactive. 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  WCT Ileus  Scrotal injury/infection   Discussion Now self extubated. He is weak and on NRB w/ RR in mid-30s w/ mild accessory use but awake and interactive and ABG actually looks ok. Nursing noted fairly strong cough after self extubation but can't get him to do it to demand. He is currently on high dose on NE in addition to milrinone. Now off iNO and so far CVP still 11. Notes from day shift reviewed. Really seems like he has end-stage RV failure and we should be considering palliation.   Plan Will cont current NE infusion, as well as milrinone Cont to try to pull fluid w/ CRRT Keep NPO Repeat ABG 1 hr and re-assess at bedside. High likelihood he will need re-intubation.  Trying heated high flow. NIPPV contraindicated w/ ileus.    My time 47 min Simonne Martinet ACNP-BC [facility] Pulmonary/Critical Care Pager # [phone] OR # [phone] if no answer

## 2023-04-04 NOTE — Plan of Care (Signed)
  Problem: Education: Goal: Knowledge of General Education information will improve Description: Including pain rating scale, medication(s)/side effects and non-pharmacologic comfort measures Outcome: Not Progressing   Problem: Health Behavior/Discharge Planning: Goal: Ability to manage health-related needs will improve Outcome: Not Progressing   Problem: Clinical Measurements: Goal: Diagnostic test results will improve Outcome: Not Progressing Goal: Respiratory complications will improve Outcome: Not Progressing

## 2023-04-04 NOTE — H&P (View-Only) (Signed)
Advanced Heart Failure Rounding Note  PCP-Cardiologist: Little Ishikawa, MD   Subjective:   9/26 iNO started.  Overnight vomiting.  400 cc out.   On CRRT. Unable to pull much due to hypotension. CVP 13-14.   Currently on milrinone 0.125, norepi @ 10, and vaso 0.04. CO-OX 84%.  WBC trending up to 25.   Awake following commands.  Objective:   Weight Range: 105.2 kg Body mass index is 32.35 kg/m.   Vital Signs:   Temp:  [94.4 F (34.7 C)-100.4 F (38 C)] 98.3 F (36.8 C) (09/27 0750) Pulse Rate:  [78-134] 89 (09/27 0900) Resp:  [11-34] 30 (09/27 0900) SpO2:  [91 %-100 %] 96 % (09/27 0900) Arterial Line BP: (92-174)/(37-77) 115/52 (09/27 0900) FiO2 (%):  [30 %] 30 % (09/27 0732) Weight:  [105.2 kg] 105.2 kg (09/27 0500) Last BM Date :  (pta)  Weight change: Filed Weights   04/02/23 0314 04/03/23 0500 04/04/23 0500  Weight: 108.5 kg 107.9 kg 105.2 kg    Intake/Output:   Intake/Output Summary (Last 24 hours) at 04/04/2023 1008 Last data filed at 04/04/2023 0903 Gross per 24 hour  Intake 2511.46 ml  Output 4282 ml  Net -1770.54 ml    CVP 13-14  Physical Exam    General:  Intubated/CRRT HEENT: ETT Neck: Supple. JVP difficult to asssess. .Carotids 2+ bilat; no bruits. No lymphadenopathy or thyromegaly appreciated.LIJ   Cor: PMI nondisplaced. Regular rate & rhythm. No rubs, gallops or murmurs. Lungs: Clear Abdomen: Soft, nontender, distended. No hepatosplenomegaly. No bruits or masses. Good bowel sounds. Extremities: No cyanosis, clubbing, rash, edema Neuro: Awake following commands.   Telemetry   SR 6-70s   EKG    N/A   Labs    CBC Recent Labs    04/03/23 0459 04/04/23 0210  WBC 20.2* 24.9*  HGB 10.7* 11.1*  HCT 31.0* 31.8*  MCV 81.4 81.1  PLT 121* 128*   Basic Metabolic Panel Recent Labs    16/10/96 0459 04/03/23 1555 04/04/23 0210  NA 135 135 132*  K 3.2* 3.8 3.4*  CL 104 99 98  CO2 23 21* 22  GLUCOSE 169* 225* 235*   BUN 27* 29* 31*  CREATININE 2.07* 1.97* 1.24  CALCIUM 7.8* 8.1* 8.4*  MG 2.5*  --  2.5*  PHOS 2.6 2.3* 2.4*   Liver Function Tests Recent Labs    04/03/23 1124 04/03/23 1555 04/04/23 0210  AST 209*  --  151*  ALT 276*  --  240*  ALKPHOS 167*  --  178*  BILITOT 16.5*  --  19.0*  PROT 7.8  --  7.8  ALBUMIN 2.1* 1.9* 2.0*  2.0*   No results for input(s): "LIPASE", "AMYLASE" in the last 72 hours. Cardiac Enzymes No results for input(s): "CKTOTAL", "CKMB", "CKMBINDEX", "TROPONINI" in the last 72 hours.  BNP: BNP (last 3 results) Recent Labs    03/29/23 1158  BNP 2,670.6*    ProBNP (last 3 results) No results for input(s): "PROBNP" in the last 8760 hours.   D-Dimer No results for input(s): "DDIMER" in the last 72 hours. Hemoglobin A1C No results for input(s): "HGBA1C" in the last 72 hours. Fasting Lipid Panel No results for input(s): "CHOL", "HDL", "LDLCALC", "TRIG", "CHOLHDL", "LDLDIRECT" in the last 72 hours. Thyroid Function Tests No results for input(s): "TSH", "T4TOTAL", "T3FREE", "THYROIDAB" in the last 72 hours.  Invalid input(s): "FREET3"  Other results:   Imaging    DG Chest Port 1 View  Result Date: 04/04/2023  CLINICAL DATA:  Acute respiratory failure, aspiration in the airway. Ileus. EXAM: PORTABLE CHEST 1 VIEW, ABDOMEN ONE VIEW COMPARISON:  03/31/2023, 04/01/2023. FINDINGS: The heart is enlarged and the mediastinal contour is stable. A prosthetic cardiac valve is noted. There is atherosclerotic calcification of the aorta. The pulmonary vasculature is mildly distended. Focal airspace disease is noted in the suprahilar region on the right. Mild atelectasis is present at the right lung base. No effusion or pneumothorax. A right internal jugular central venous catheter appear stable. An enteric tube courses over the left upper quadrant. The endotracheal tube terminates 4.2 cm above the carina. Surgical clips are noted in the cervical soft tissues. There is  gaseous distention of the stomach with an enteric tube in place. Mildly distended loops of small bowel are noted in the abdomen measuring up to 3.8 cm. Rim calcified structure is noted in the right lower quadrant, compatible with known rim calcified renal cyst. No acute osseous abnormality is seen. IMPRESSION: 1. Cardiomegaly with mildly distended pulmonary vascularity. 2. Mild airspace disease in the suprahilar region on the right. 3. Atelectasis at the right lung base. Support apparatus as described above. 4. Gaseous distention of the stomach with enteric tube in place. 5. Distended loops of small bowel in the abdomen measuring up to 4.2 cm, possible ileus or obstruction. Electronically Signed   By: Thornell Sartorius M.D.   On: 04/04/2023 03:36   DG Abd 1 View  Result Date: 04/04/2023 CLINICAL DATA:  Acute respiratory failure, aspiration in the airway. Ileus. EXAM: PORTABLE CHEST 1 VIEW, ABDOMEN ONE VIEW COMPARISON:  03/31/2023, 04/01/2023. FINDINGS: The heart is enlarged and the mediastinal contour is stable. A prosthetic cardiac valve is noted. There is atherosclerotic calcification of the aorta. The pulmonary vasculature is mildly distended. Focal airspace disease is noted in the suprahilar region on the right. Mild atelectasis is present at the right lung base. No effusion or pneumothorax. A right internal jugular central venous catheter appear stable. An enteric tube courses over the left upper quadrant. The endotracheal tube terminates 4.2 cm above the carina. Surgical clips are noted in the cervical soft tissues. There is gaseous distention of the stomach with an enteric tube in place. Mildly distended loops of small bowel are noted in the abdomen measuring up to 3.8 cm. Rim calcified structure is noted in the right lower quadrant, compatible with known rim calcified renal cyst. No acute osseous abnormality is seen. IMPRESSION: 1. Cardiomegaly with mildly distended pulmonary vascularity. 2. Mild airspace  disease in the suprahilar region on the right. 3. Atelectasis at the right lung base. Support apparatus as described above. 4. Gaseous distention of the stomach with enteric tube in place. 5. Distended loops of small bowel in the abdomen measuring up to 4.2 cm, possible ileus or obstruction. Electronically Signed   By: Thornell Sartorius M.D.   On: 04/04/2023 03:36   ECHOCARDIOGRAM LIMITED  Result Date: 04/03/2023    ECHOCARDIOGRAM LIMITED REPORT   Patient Name:   Timothy Palmer [facility] Date of Exam: 04/03/2023 Medical Rec #:  [id number]      Height:       71.0 in Accession #:    [id number]     Weight:       237.9 lb Date of Birth:  [birth year]     BSA:          2.270 m Patient Age:    56 years       BP:  115/46 mmHg Patient Gender: M              HR:           77 bpm. Exam Location:  Inpatient Procedure: Limited Echo, Limited Color Doppler and Cardiac Doppler Indications:    CHF-Acute Diastolic I50.31  History:        Patient has prior history of Echocardiogram examinations, most                 recent 03/29/2023. CHF, Shock, Mitral Valve Disease and Aortic                 Valve Disease, Arrythmias:Atrial Fibrillation and Tachycardia,                 Signs/Symptoms:Hypotension; Risk Factors:Non-Smoker.                  Mitral Valve: 28 mm prosthetic annuloplasty ring valve is                 present in the mitral position.  Sonographer:    Aron Baba Referring Phys: [id number] CHRISTOPHER L SCHUMANN IMPRESSIONS  1. Left ventricular ejection fraction, by estimation, is 40 to 45%. The left ventricle has mildly decreased function. The left ventricle demonstrates global hypokinesis. There is mild left ventricular hypertrophy of the inferior segment. Left ventricular diastolic function could not be evaluated. There is the interventricular septum is flattened in systole and diastole, consistent with right ventricular pressure and volume overload.  2. Right ventricular systolic function is moderately reduced. The right  ventricular size is severely enlarged. Tricuspid regurgitation signal is inadequate for assessing PA pressure.  3. Right atrial size was severely dilated.  4. The mitral valve has been repaired/replaced. Mild mitral valve regurgitation. There is a 28 mm prosthetic annuloplasty ring present in the mitral position.  5. Tricuspid valve regurgitation is mild to moderate.  6. Known severe aortic stenosis on recent echo- aortic valve not well visualized.  7. The inferior vena cava is dilated in size with <50% respiratory variability, suggesting right atrial pressure of 15 mmHg. FINDINGS  Left Ventricle: Left ventricular ejection fraction, by estimation, is 40 to 45%. The left ventricle has mildly decreased function. The left ventricle demonstrates global hypokinesis. The left ventricular internal cavity size was normal in size. There is  mild left ventricular hypertrophy of the inferior segment. The interventricular septum is flattened in systole and diastole, consistent with right ventricular pressure and volume overload. Left ventricular diastolic function could not be evaluated. Right Ventricle: The right ventricular size is severely enlarged. No increase in right ventricular wall thickness. Right ventricular systolic function is moderately reduced. Tricuspid regurgitation signal is inadequate for assessing PA pressure. Left Atrium: Left atrial size was not assessed. Right Atrium: Right atrial size was severely dilated. Pericardium: There is no evidence of pericardial effusion. Mitral Valve: The mitral valve has been repaired/replaced. Mild mitral valve regurgitation. There is a 28 mm prosthetic annuloplasty ring present in the mitral position. Tricuspid Valve: The tricuspid valve is normal in structure. Tricuspid valve regurgitation is mild to moderate. No evidence of tricuspid stenosis. Aortic Valve: Known severe aortic stenosis on recent echo- aortic valve not well visualized. Pulmonic Valve: The pulmonic valve was  normal in structure. Pulmonic valve regurgitation is not visualized. No evidence of pulmonic stenosis. Aorta: The ascending aorta was not well visualized, the aortic arch was not well visualized and the aortic root was not well visualized. Venous: The inferior vena cava is dilated  in size with less than 50% respiratory variability, suggesting right atrial pressure of 15 mmHg. IAS/Shunts: No atrial level shunt detected by color flow Doppler. Additional Comments: Spectral Doppler performed. Color Doppler performed.  LEFT VENTRICLE PLAX 2D LVIDd:         4.80 cm LVIDs:         3.80 cm LV PW:         1.20 cm LV IVS:        0.90 cm  RIGHT VENTRICLE RV S prime:     10.30 cm/s TAPSE (M-mode): 1.3 cm RIGHT ATRIUM           Index RA Area:     26.40 cm RA Volume:   93.40 ml  41.15 ml/m Kardie Tobb DO Electronically signed by Thomasene Ripple DO Signature Date/Time: 04/03/2023/5:15:10 PM    Final      Medications:     Scheduled Medications:  calcitRIOL  0.75 mcg Per Tube Q T,Th,Sat-1800   Chlorhexidine Gluconate Cloth  6 each Topical Daily   docusate  100 mg Per Tube BID   insulin aspart  0-15 Units Subcutaneous Q4H   multivitamin  1 tablet Per Tube QHS   mouth rinse  15 mL Mouth Rinse Q2H   pantoprazole (PROTONIX) IV  40 mg Intravenous Daily   polyethylene glycol  17 g Per Tube BID   sennosides  10 mL Per Tube BID    Infusions:   prismasol BGK 4/2.5 400 mL/hr at 04/04/23 0648    prismasol BGK 4/2.5 400 mL/hr at 04/04/23 0648   sodium chloride     ceFEPime (MAXIPIME) IV Stopped (04/04/23 0743)   feeding supplement (PIVOT 1.5 CAL) Stopped (04/04/23 0110)   heparin 2,100 Units/hr (04/04/23 0900)   milrinone 0.125 mcg/kg/min (04/04/23 0900)   norepinephrine (LEVOPHED) Adult infusion 10 mcg/min (04/04/23 0900)   prismasol BGK 4/2.5 1,500 mL/hr at 04/04/23 0902   vasopressin 0.04 Units/min (04/04/23 0900)    PRN Medications: fentaNYL, fentaNYL (SUBLIMAZE) injection, Gerhardt's butt cream, heparin, lip  balm, midazolam, mouth rinse, prochlorperazine    Patient Profile   57 y/o male with ESRD, severe AS and PAH. Admitted with respiratory failure.   Assessment/Plan  Acute on chronic combined systolic and diastolic heart failure>>cardiogenic shock - Lactic acid 11.9 on admission, now down trending. Last 2 03/31/23 - RHC 9/24: Cardiogenic shock with moderate to severe PAH and end-stage RV failure. RA 17, PA 77/25 (42), Fick CO/CI 3.3/1.5, TD CO/CI 3.6/1.6. PVR 9 WU, PAPi 3. PAO2 35% - Now on milrinone 0.125, norepi @ 10, and vaso 0.04. Co-ox 84%  - CVP 13-14. On CRRT  - GDMT limited with ESRD, consider SGLT2i? - Not a candidate for advanced therapies with co morbidities (ESRD, PAH) - Plan  for RHC today.    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    WCT - WCT on admission suspect secondary to electrolyte  imbalances/wide QTc -Keep K > 4 & Mg > 2    AFL - noted this admission, may need amio - suspect 2/2 milrinone? - Maintaining SR.  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 ID WBC  25.  Vomiting earlier this morning. Concerned about aspiration.    Length of Stay: 6  Amy Clegg, NP  04/04/2023, 10:08 AM  Advanced Heart Failure Team Pager [phone] (M-F; 7a - 5p)  Please contact [facility] for night-coverage after hours (5p -7a ) and weekends on [email or web address]   Agree with above.   Awake on vent. Moves all 4.  Doesn't follow commands reliably.   WBC up and had vomiting overnight  On CVVHD. Now on NO at 40, milrinone 0.125 and VP. Co-ox 84% CVP 14  General:  Ill-appearing. On vent awake  HEENT: normal +ETT Neck: supple. HD cath   Cor: Regular rate & rhythm. 3/6 AS Lungs: clear Abdomen: soft, nontender, nondistended. No hepatosplenomegaly. No bruits or masses. Good bowel sounds. Extremities: no cyanosis, clubbing, rash, edema Neuro: awake not following commands reliably   He remains critically ill. Will repeat RHC to see if PAH modifiable or fixed on iNO. Dr. Lynnette Caffey following for possible TAVR but suspect he may be too high risk.   RHC discussed with his sister by phone. Given rising WBC will not leave swan in at this time.   CRITICAL CARE Performed by: Arvilla Meres  Total critical care time: 40 minutes  Critical care time was exclusive of separately billable procedures and treating other patients.  Critical care was necessary to treat or prevent imminent or life-threatening deterioration.  Critical care was time spent personally by me (independent of midlevel providers or residents) on the following activities: development of treatment plan with patient and/or surrogate as well as nursing, discussions with consultants, evaluation of patient's response to treatment, examination of patient, obtaining history from patient or surrogate, ordering and performing treatments and interventions, ordering and review of laboratory studies, ordering and review of radiographic studies, pulse oximetry and re-evaluation of patient's condition.  Arvilla Meres, MD  11:10 AM

## 2023-04-04 NOTE — Interval H&P Note (Signed)
History and Physical Interval Note:  04/04/2023 11:22 AM  Timothy Palmer  has presented today for surgery, with the diagnosis of hp.  The various methods of treatment have been discussed with the patient and family. After consideration of risks, benefits and other options for treatment, the patient has consented to  Procedure(s): RIGHT HEART CATH (N/A) as a surgical intervention.  The patient's history has been reviewed, patient examined, no change in status, stable for surgery.  I have reviewed the patient's chart and labs.  Questions were answered to the patient's satisfaction.     Adalbert Alberto

## 2023-04-04 NOTE — Progress Notes (Addendum)
NAME:  Timothy Palmer, MRN:  [id number], DOB:  [birth year], LOS: 6 ADMISSION DATE:  03/29/2023, CONSULTATION DATE:  03/29/2023 REFERRING MD: EDP, CHIEF COMPLAINT: Shock  History of Present Illness:  57 year old male patient with history of ESRD ( T, Th, Sat) , Systolic and diastolic CHF, HTN, Mitral valve repair ( 2015) and replacement 2/2 MSSA  endocarditis(2016), Mycotic aneurysm, Upper extremity DVT(2015) and PVD. Presented to the Ed 9/21 with history or 4 days of diarrhea which resolved 9/20, with poor po intake and fatigue with weakness and body aches. In the ED he had Lactate of 11.9, Troponin of 900, bedside echo by ED MD showed decreased EF. Last echo 2016 showed EF of 50-55%. He is not followed by cardiology. He is not on blood thinners as they caused his HD graph to bleed. PCCM were notified by ED MD  and asked to admit the patient and manage care .  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   Significant Hospital Events: Including procedures, antibiotic start and stop dates in addition to other pertinent events   03/29/2023 Admission  - Stated he is weak and tired, has no energy.Currently on 2 L [place] with sats of 100%,HD is due today,  9/21> echo with severe RV enlargement and pulmonary hypertension severe arctic stenosis that is calcified.  Nephrology started CRRT due to shock.  Patient went on pressors.  HD cath placed.  Later intubated.  Also had had wide-complex rhythm agonal respirations but appears did not lose any pulses.  Received cardioversion received stat hyperkalemia treatment.  Status post arterial line insertion 09/24: Heart cath showing severe right heart failure  Interim History / Subjective:   Overnight events: Overnight patient did have concern for ileus versus obstruction with dilated loops of bowel.  NG tube put to suction.  Evaluated bedside this morning.  Wound care nurse concerned about scrotum.  Patient did not follow instructions.  Objective   Blood pressure 139/70, pulse (!) 140, temperature 98.3 F (36.8 C), temperature source Axillary, resp. rate (!) 26, height 5\' 11"  (1.803 m), weight 105.2 kg, SpO2 100%.  Vent Mode: PRVC FiO2 (%):  [30 %] 30 % Set Rate:  [20 bmp] 20 bmp Vt Set:  [600 mL] 600 mL PEEP:  [5 cmH20] 5 cmH20 Plateau Pressure:  [23 cmH20-28 cmH20] 25 cmH20   Intake/Output Summary (Last 24 hours) at 04/04/2023 1545 Last data filed at 04/04/2023 1500 Gross per 24 hour  Intake 2139.76 ml  Output 3514 ml  Net -1374.24 ml   Filed Weights   04/02/23 0314 04/03/23 0500 04/04/23 0500  Weight: 108.5 kg 107.9 kg 105.2 kg    Examination: General: Acute distress, intubated,  Eyes: Scleral icterus HENT: Normocephalic, atraumatic, ET tube in place Lungs: Clear to auscultation bilaterally Cardiovascular: Regular rate, 3/6 systolic murmur appreciated throughout, no gallops Abdomen: Soft, nontender, nontender, normal active bowel sounds Extremities: Trace edema bilaterally to lower extremities Neuro: Intubated, is able  to follow all commands  GU: On CRRT, scrotal skin sloughing  Resolved Hospital Problem list     Assessment & Plan:   #Combined shock with cardiogenic and  septic #Severe aortic valve stenosis Patient had right heart cath today and patient still has severe pulmonary artery hypertension despite milrinone and high-dose  iNO.  They are concerned that this is severe/end-stage pulmonary hypertension.  Not much more they can offer from heart failure standpoint, and they signed off.  Plan will be for patient to be weaned off ventilator with the use of milrinone and iNO.  If this is not to work, potentially patient will need to have palliative care conversation.  Will continue medical management at this time. CVP goal 10-15. -Continue cefepime day 7  -Continue inotropic support with milrinone per cardiology -Continue Levophed and vasopressin -MAP goal greater than 65 -Cardiology following, appreciate recommendations -Continue heparin -Continue serial COOX measurements  #Acute hypoxemic respiratory failure Likely secondary to above.  There is also concern for concomitant aspiration pneumonia as patient aspirated yesterday.  Will extend treatment for 3 more days with cefepime. -VAP bundle -Continue cefepime day 7 of 10 -VAP bundle -Pulmonary toilet and hygiene -Wean off ventilator and wean sedation  #Ileus Patient had some aspiration yesterday.  KUB showed evidence of ileus.  Will look OG up to suction. -OG to suction  #Scrotal edema and skin sloughing On this patient for scrotal edema and skin sloughing.  Exam today, patient does have worsening skin sloughing. -Consult urology -There is some scrotal wall edema on ultrasound, but no evidence of torsion  #History of V. Tach #Concern for a flutter No acute concerns today.  Patient seems to be in sinus rhythm. -Continue telemetry  #ESRD #Hypokalemia #Hyponatremia Patient doing well with CRRT. 3.5 Liters pulled off yesterday. -Continue CRRT per nephrology  #Hyperbilirubinemia Bilirubin up to 19.  This likely secondary to congestive hepatopathy.  Patient does have some scleral  icterus. -Continue to monitor  Best Practice (right click and "Reselect all SmartList Selections" daily)   Diet/type: tubefeeds DVT prophylaxis: systemic heparin GI prophylaxis: PPI Lines: Central line Foley:  N/A and Yes, and it is still needed Code Status:  full code  Labs   CBC: Recent Labs  Lab 03/29/23 0800 03/29/23 1118 03/31/23 0224 04/01/23 0417 04/01/23 1718 04/02/23 0441 04/03/23 0459 04/04/23 0210  WBC 22.6*   < > 19.6* 18.6*  --  17.1* 20.2* 24.9*  NEUTROABS 20.0*  --   --   --   --   --   --   --   HGB 12.2*   < > 10.8* 10.5* 12.6* 10.4* 10.7* 11.1*  HCT 38.4*   < > 30.4* 30.8* 37.0* 29.6* 31.0* 31.8*  MCV 88.9   < > 82.6 82.4  --  82.2 81.4 81.1  PLT 236   < > 167 156  --  125* 121* 128*   < > = values in this interval not displayed.    Basic Metabolic Panel: Recent Labs  Lab 04/01/23 0417 04/01/23 1542 04/01/23 1718 04/02/23 0441 04/02/23 0454 04/02/23 1740 04/03/23 0459 04/03/23 1555 04/04/23 0210  NA 131* 136   < >  --  132* 135 135 135 132*  K 3.5 3.4*   < >  --  2.8* 3.2* 3.2* 3.8 3.4*  CL 97* 97*  --   --  99 103 104 99 98  CO2 19* 21*  --   --  22 23 23  21* 22  GLUCOSE 221*  145*  --   --  181* 167* 169* 225* 235*  BUN 35* 33*  --   --  33* 26* 27* 29* 31*  CREATININE 2.80* 2.42*  --   --  2.26* 2.10* 2.07* 1.97* 1.24  CALCIUM 7.1* 7.7*  --   --  7.4* 7.4* 7.8* 8.1* 8.4*  MG 2.6* 2.6*  --  2.6*  --   --  2.5*  --  2.5*  PHOS 3.7 3.1  --  2.7 2.7 2.5 2.6 2.3* 2.4*   < > = values in this interval not displayed.   GFR: Estimated Creatinine Clearance: 83.1 mL/min (by C-G formula based on SCr of 1.24 mg/dL). Recent Labs  Lab 03/29/23 1158 03/29/23 1655 03/29/23 1912 03/29/23 2114 03/30/23 0134 03/31/23 0224 04/01/23 0417 04/02/23 0441 04/03/23 0459 04/04/23 0210  PROCALCITON 47.41  --   --   --   --   --   --   --   --   --   WBC 22.1*  --  23.6*  --  22.2* 19.6* 18.6* 17.1* 20.2* 24.9*  LATICACIDVEN  --    < > >9.0* 6.7* 4.8*  2.0*  --   --   --   --    < > = values in this interval not displayed.    Liver Function Tests: Recent Labs  Lab 03/29/23 1013 03/30/23 0312 04/01/23 0417 04/01/23 1542 04/02/23 1740 04/03/23 0459 04/03/23 1124 04/03/23 1555 04/04/23 0210  AST 445*  --  1,061*  --   --   --  209*  --  151*  ALT 96*  --  550*  --   --   --  276*  --  240*  ALKPHOS 165*  --  155*  --   --   --  167*  --  178*  BILITOT 8.6*  --  9.1*  --   --   --  16.5*  --  19.0*  PROT 7.6  --  7.2  --   --   --  7.8  --  7.8  ALBUMIN 2.2*   < > 1.8*  1.8*   < > 1.7* 1.7* 2.1* 1.9* 2.0*  2.0*   < > = values in this interval not displayed.   Recent Labs  Lab 03/29/23 1158  LIPASE 41  AMYLASE 71   No results for input(s): "AMMONIA" in the last 168 hours.  ABG    Component Value Date/Time   PHART 7.452 (H) 03/30/2023 1252   PCO2ART 33.2 03/30/2023 1252   PO2ART 166 (H) 03/30/2023 1252   HCO3 24.9 04/01/2023 1718   TCO2 26 04/01/2023 1718   ACIDBASEDEF 1.0 04/01/2023 1718   O2SAT 83.6 04/04/2023 0916     Coagulation Profile: Recent Labs  Lab 03/29/23 1158  INR 1.8*    Cardiac Enzymes: No results for input(s): "CKTOTAL", "CKMB", "CKMBINDEX", "TROPONINI" in the last 168 hours.  HbA1C: Hgb A1c MFr Bld  Date/Time Value Ref Range Status  03/29/2023 11:58 AM 5.7 (H) 4.8 - 5.6 % Final    Comment:    (NOTE) Pre diabetes:          5.7%-6.4%  Diabetes:              >6.4%  Glycemic control for   <7.0% adults with diabetes   04/05/2014 04:19 AM 6.0 (H) <5.7 % Final    Comment:    (NOTE)  According to the ADA Clinical Practice Recommendations for 2011, when HbA1c is used as a screening test:  >=6.5%   Diagnostic of Diabetes Mellitus           (if abnormal result is confirmed) 5.7-6.4%   Increased risk of developing Diabetes Mellitus References:Diagnosis and Classification of Diabetes Mellitus,Diabetes Care,2011,34(Suppl  1):S62-S69 and Standards of Medical Care in         Diabetes - 2011,Diabetes Care,2011,34 (Suppl 1):S11-S61.    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, severe mitral regurgitation, and septic embolization (03/21/2014), Chronic diastolic congestive heart failure (HCC), DVT of upper extremity (deep vein thrombosis) (HCC) (03/04/2014), ESRD (end stage renal disease) on dialysis [facility]), Hypertension, 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   Surgical History:   Past Surgical History:  Procedure Laterality Date   AV FISTULA PLACEMENT Left ?2005   forearm    BASCILIC VEIN TRANSPOSITION Left 12/27/2015   Procedure: FIRST STAGE BASILIC VEIN TRANSPOSITION;  Surgeon: Fransisco Hertz, MD;  Location: [facility] OR;  Service: Vascular;  Laterality: Left;   BASCILIC VEIN TRANSPOSITION Left 02/12/2016   Procedure: SECOND STAGE BASILIC VEIN TRANSPOSITION;  Surgeon: Fransisco Hertz, MD;  Location: [facility] OR;  Service: Vascular;  Laterality: Left;   CENTRAL LINE INSERTION  04/01/2023   Procedure: CENTRAL LINE INSERTION;  Surgeon: Dolores Patty, MD;  Location: [facility] INVASIVE CV LAB;  Service: Cardiovascular;;   EMBOLECTOMY Left 03/25/2014   Procedure: EMBOLECTOMY left popliteal;  Surgeon: Larina Earthly, MD;  Location[facility] OR;   Service: Vascular;  Laterality: Left;   FEMORAL-POPLITEAL BYPASS GRAFT Left 03/25/2014   Procedure: Left Femoral- Below Knee Popliteal Bypass Graft;  Surgeon: Larina Earthly, MD;  Location: [facility] OR;  Service: Vascular;  Laterality: Left;   INTRAOPERATIVE TRANSESOPHAGEAL ECHOCARDIOGRAM N/A 04/05/2014   Procedure: INTRAOPERATIVE TRANSESOPHAGEAL ECHOCARDIOGRAM;  Surgeon: Purcell Nails, MD;  Location: [facility] OR;  Service: Open Heart Surgery;  Laterality: N/A;   LEFT AND RIGHT HEART CATHETERIZATION WITH CORONARY ANGIOGRAM N/A 03/28/2014   Procedure: LEFT AND RIGHT HEART CATHETERIZATION WITH CORONARY ANGIOGRAM;  Surgeon: Laurey Morale, MD;  Location: [facility] CATH LAB;  Service: Cardiovascular;  Laterality: N/A;   MITRAL VALVE REPAIR Right 04/05/2014   Procedure: MINIMALLY INVASIVE MITRAL VALVE REPAIR (MVR);  Surgeon: Purcell Nails, MD;  Location: [facility] OR;  Service: Open Heart Surgery;  Laterality: Right;   MITRAL VALVE REPLACEMENT Right 04/05/2014   Procedure: Bring back MINIMALLY INVASIVE MITRAL VALVE (MV) REPLACEMENT - Reexploration for bleeding;  Surgeon: Purcell Nails, MD;  Location: [facility] OR;  Service: Open Heart Surgery;  Laterality: Right;   PARATHYROIDECTOMY N/A 03/22/2016   Procedure: PARATHYROIDECTOMY AUTOTRANSPLANT;  Surgeon: Darnell Level, MD;  Location[facility] OR;  Service: General;  Laterality: N/A;   PATCH ANGIOPLASTY Left 07/23/2013   Procedure: PATCH ANGIOPLASTY- LEFT RADIOCEPHALIC ARTERIOVENOUS FISTULA;  Surgeon: Chuck Hint, MD;  Location: [facility] OR;  Service:  Vascular;  Laterality: Left;   REVISON OF ARTERIOVENOUS FISTULA Left 11/04/2019   Procedure: REVISON OF LEFT ARM ARTERIOVENOUS FISTULA;  Surgeon: Larina Earthly, MD;  Location: [facility] OR;  Service: Vascular;  Laterality: Left;   RIGHT HEART CATH N/A 04/01/2023   Procedure: RIGHT HEART CATH;  Surgeon: Dolores Patty, MD;  Location: [facility] INVASIVE CV LAB;  Service: Cardiovascular;  Laterality: N/A;   SHUNTOGRAM Left November 04, 2011   TEE WITHOUT  CARDIOVERSION N/A 03/24/2014   Procedure: TRANSESOPHAGEAL ECHOCARDIOGRAM (TEE);  Surgeon: Laurey Morale, MD;  Location: [facility] ENDOSCOPY;  Service: Cardiovascular;  Laterality: N/A;   TEE WITHOUT CARDIOVERSION N/A 04/12/2015   Procedure: TRANSESOPHAGEAL ECHOCARDIOGRAM (TEE);  Surgeon: Laurey Morale, MD;  Location: [facility] ENDOSCOPY;  Service: Cardiovascular;  Laterality: N/A;   TEE WITHOUT CARDIOVERSION N/A 08/18/2015   Procedure: TRANSESOPHAGEAL ECHOCARDIOGRAM (TEE);  Surgeon: Laurey Morale, MD;  Location: [facility] ENDOSCOPY;  Service: Cardiovascular;  Laterality: N/A;     Social History:   reports that he has never smoked. He has never used smokeless tobacco. He reports current alcohol use. He reports current drug use. Frequency: 7.00 times per week. Drug: Marijuana.   Family History:  His family history includes Diabetes in his mother; Hypertension in his brother, father, mother, and sister.   Allergies No Known Allergies   Home Medications  Prior to Admission medications   Medication Sig Start Date End Date Taking? Authorizing Provider  lisinopril (PRINIVIL,ZESTRIL) 20 MG tablet Take 20 mg by mouth daily.   Yes [provider]  multivitamin (RENA-VIT) TABS tablet Take 1 tablet by mouth at bedtime. Patient taking differently: Take 1 tablet by mouth 3 (three) times a week. 04/14/15  Yes Hongalgi, Maximino Greenland, MD  sevelamer carbonate (RENVELA) 800 MG tablet Take 1,600 mg by mouth daily.   Yes [provider]  Nutritional Supplements (FEEDING SUPPLEMENT, NEPRO CARB STEADY,) LIQD Take 237 mLs by mouth 2 (two) times daily between meals. Patient not taking: Reported on 11/04/2019 04/14/15   Elease Etienne, MD     Critical care time: 40 mins     Modena Slater, DO Internal Medicine Resident PGY-2 Pager: [phone]

## 2023-04-05 DIAGNOSIS — R579 Shock, unspecified: Secondary | ICD-10-CM | POA: Diagnosis not present

## 2023-04-05 LAB — APTT
aPTT: 65 s — ABNORMAL HIGH (ref 24–36)
aPTT: 97 s — ABNORMAL HIGH (ref 24–36)
aPTT: 89 s — ABNORMAL HIGH (ref 24–36)

## 2023-04-05 LAB — GLUCOSE, CAPILLARY
Glucose-Capillary: 126 mg/dL — ABNORMAL HIGH (ref 70–99)
Glucose-Capillary: 112 mg/dL — ABNORMAL HIGH (ref 70–99)
Glucose-Capillary: 120 mg/dL — ABNORMAL HIGH (ref 70–99)
Glucose-Capillary: 135 mg/dL — ABNORMAL HIGH (ref 70–99)
Glucose-Capillary: 138 mg/dL — ABNORMAL HIGH (ref 70–99)
Glucose-Capillary: 130 mg/dL — ABNORMAL HIGH (ref 70–99)

## 2023-04-05 LAB — BILIRUBIN, FRACTIONATED(TOT/DIR/INDIR)
Bilirubin, Direct: 12.3 mg/dL — ABNORMAL HIGH (ref 0.0–0.2)
Indirect Bilirubin: 8.4 mg/dL — ABNORMAL HIGH (ref 0.3–0.9)
Total Bilirubin: 21 mg/dL (ref 0.3–1.2)

## 2023-04-05 LAB — POCT I-STAT 7, (LYTES, BLD GAS, ICA,H+H)
Acid-Base Excess: 0 mmol/L (ref 0.0–2.0)
Bicarbonate: 24.9 mmol/L (ref 20.0–28.0)
Calcium, Ion: 1.07 mmol/L — ABNORMAL LOW (ref 1.15–1.40)
HCT: 36 % — ABNORMAL LOW (ref 39.0–52.0)
Hemoglobin: 12.2 g/dL — ABNORMAL LOW (ref 13.0–17.0)
O2 Saturation: 99 %
Patient temperature: 98.5
Potassium: 4.4 mmol/L (ref 3.5–5.1)
Sodium: 137 mmol/L (ref 135–145)
TCO2: 26 mmol/L (ref 22–32)
pCO2 arterial: 41.9 mm[Hg] (ref 32–48)
pH, Arterial: 7.383 (ref 7.35–7.45)
pO2, Arterial: 157 mm[Hg] — ABNORMAL HIGH (ref 83–108)

## 2023-04-05 LAB — CBC
HCT: 29.8 % — ABNORMAL LOW (ref 39.0–52.0)
Hemoglobin: 10.7 g/dL — ABNORMAL LOW (ref 13.0–17.0)
MCH: 29.4 pg (ref 26.0–34.0)
MCHC: 35.9 g/dL (ref 30.0–36.0)
MCV: 81.9 fL (ref 80.0–100.0)
Platelets: 161 10*3/uL (ref 150–400)
RBC: 3.64 MIL/uL — ABNORMAL LOW (ref 4.22–5.81)
RDW: 18 % — ABNORMAL HIGH (ref 11.5–15.5)
WBC: 26.3 10*3/uL — ABNORMAL HIGH (ref 4.0–10.5)
nRBC: 10.6 % — ABNORMAL HIGH (ref 0.0–0.2)

## 2023-04-05 LAB — ALT: ALT: 170 U/L — ABNORMAL HIGH (ref 0–44)

## 2023-04-05 LAB — RENAL FUNCTION PANEL
Albumin: 2 g/dL — ABNORMAL LOW (ref 3.5–5.0)
Albumin: 2.4 g/dL — ABNORMAL LOW (ref 3.5–5.0)
Anion gap: 13 (ref 5–15)
Anion gap: 12 (ref 5–15)
BUN: 32 mg/dL — ABNORMAL HIGH (ref 6–20)
BUN: 31 mg/dL — ABNORMAL HIGH (ref 6–20)
CO2: 24 mmol/L (ref 22–32)
CO2: 23 mmol/L (ref 22–32)
Calcium: 8.3 mg/dL — ABNORMAL LOW (ref 8.9–10.3)
Calcium: 8.3 mg/dL — ABNORMAL LOW (ref 8.9–10.3)
Chloride: 100 mmol/L (ref 98–111)
Chloride: 100 mmol/L (ref 98–111)
Creatinine, Ser: 1.79 mg/dL — ABNORMAL HIGH (ref 0.61–1.24)
Creatinine, Ser: 1.11 mg/dL (ref 0.61–1.24)
GFR, Estimated: 44 mL/min — ABNORMAL LOW (ref >60)
GFR, Estimated: >60 mL/min (ref >60)
Glucose, Bld: 124 mg/dL — ABNORMAL HIGH (ref 70–99)
Glucose, Bld: 144 mg/dL — ABNORMAL HIGH (ref 70–99)
Phosphorus: 3.3 mg/dL (ref 2.5–4.6)
Phosphorus: 3.1 mg/dL (ref 2.5–4.6)
Potassium: 4.5 mmol/L (ref 3.5–5.1)
Potassium: 4.6 mmol/L (ref 3.5–5.1)
Sodium: 137 mmol/L (ref 135–145)
Sodium: 135 mmol/L (ref 135–145)

## 2023-04-05 LAB — BILIRUBIN, TOTAL: Total Bilirubin: 20 mg/dL (ref 0.3–1.2)

## 2023-04-05 LAB — ALKALINE PHOSPHATASE: Alkaline Phosphatase: 160 U/L — ABNORMAL HIGH (ref 38–126)

## 2023-04-05 LAB — BILIRUBIN, DIRECT: Bilirubin, Direct: 13.5 mg/dL — ABNORMAL HIGH (ref 0.0–0.2)

## 2023-04-05 LAB — HEPARIN LEVEL (UNFRACTIONATED)
Heparin Unfractionated: 0.23 [IU]/mL — ABNORMAL LOW (ref 0.30–0.70)
Heparin Unfractionated: 0.2 [IU]/mL — ABNORMAL LOW (ref 0.30–0.70)

## 2023-04-05 LAB — COOXEMETRY PANEL
Carboxyhemoglobin: 1.7 % — ABNORMAL HIGH (ref 0.5–1.5)
Carboxyhemoglobin: 2.1 % — ABNORMAL HIGH (ref 0.5–1.5)
Methemoglobin: <0.7 % (ref 0.0–1.5)
Methemoglobin: <0.7 % (ref 0.0–1.5)
O2 Saturation: 75.1 %
O2 Saturation: 81.6 %
Total hemoglobin: 11.1 g/dL — ABNORMAL LOW (ref 12.0–16.0)
Total hemoglobin: 11.1 g/dL — ABNORMAL LOW (ref 12.0–16.0)

## 2023-04-05 LAB — MAGNESIUM: Magnesium: 2.6 mg/dL — ABNORMAL HIGH (ref 1.7–2.4)

## 2023-04-05 LAB — PROTEIN, TOTAL: Total Protein: 7.6 g/dL (ref 6.5–8.1)

## 2023-04-05 LAB — AST: AST: 98 U/L — ABNORMAL HIGH (ref 15–41)

## 2023-04-05 MED ORDER — ALBUMIN HUMAN 25 % IV SOLN
25.0000 g | Freq: Once | INTRAVENOUS | Status: AC
  Administered 2023-04-05: 25 g via INTRAVENOUS
  Filled 2023-04-05: qty 100

## 2023-04-05 MED ORDER — ALBUMIN HUMAN 25 % IV SOLN
25.0000 g | Freq: Four times a day (QID) | INTRAVENOUS | Status: AC
  Administered 2023-04-05 – 2023-04-06 (×4): 25 g via INTRAVENOUS
  Filled 2023-04-05 (×4): qty 100

## 2023-04-05 MED ORDER — AMIODARONE HCL IN DEXTROSE 360-4.14 MG/200ML-% IV SOLN
INTRAVENOUS | Status: AC
  Administered 2023-04-05: 60 mg/h via INTRAVENOUS
  Filled 2023-04-05: qty 200

## 2023-04-05 MED ORDER — ORAL CARE MOUTH RINSE: 15.0000 mL | OROMUCOSAL | Status: DC | PRN

## 2023-04-05 MED ORDER — AMIODARONE LOAD VIA INFUSION
150.0000 mg | Freq: Once | INTRAVENOUS | Status: AC
  Administered 2023-04-05: 150 mg via INTRAVENOUS
  Filled 2023-04-05: qty 83.34

## 2023-04-05 MED ORDER — AMIODARONE HCL IN DEXTROSE 360-4.14 MG/200ML-% IV SOLN
30.0000 mg/h | INTRAVENOUS | Status: DC
  Administered 2023-04-06 – 2023-04-09 (×7): 30 mg/h via INTRAVENOUS
  Filled 2023-04-05 (×5): qty 200

## 2023-04-05 MED ORDER — AMIODARONE HCL IN DEXTROSE 360-4.14 MG/200ML-% IV SOLN
60.0000 mg/h | INTRAVENOUS | Status: AC
  Administered 2023-04-05: 60 mg/h via INTRAVENOUS

## 2023-04-05 NOTE — Plan of Care (Signed)
  Problem: Education: Goal: Knowledge of General Education information will improve Description: Including pain rating scale, medication(s)/side effects and non-pharmacologic comfort measures Outcome: Not Progressing   Problem: Health Behavior/Discharge Planning: Goal: Ability to manage health-related needs will improve Outcome: Not Progressing   Problem: Clinical Measurements: Goal: Ability to maintain clinical measurements within normal limits will improve Outcome: Not Progressing Goal: Will remain free from infection Outcome: Not Progressing Goal: Diagnostic test results will improve Outcome: Not Progressing

## 2023-04-05 NOTE — Progress Notes (Signed)
1 Day Post-Op Subjective: No acute events overnight. He is extubated  Objective: Vital signs in last 24 hours: Temp:  [97.1 F (36.2 C)-99 F (37.2 C)] 97.1 F (36.2 C) (09/28 1145) Pulse Rate:  [91-147] 137 (09/28 1200) Resp:  [13-52] 49 (09/28 1200) SpO2:  [96 %-100 %] 100 % (09/28 1200) Arterial Line BP: (73-167)/(35-67) 116/52 (09/28 1200) FiO2 (%):  [30 %-80 %] 50 % (09/28 1237) Weight:  [105.2 kg] 105.2 kg (09/28 0446)  Intake/Output from previous day: 09/27 0701 - 09/28 0700 In: 1798.6 [I.V.:1037.4; IV Piggyback:761.2] Out: 1627.6 [Emesis/NG output:700] Intake/Output this shift: Total I/O In: 504.9 [I.V.:391.2; IV Piggyback:113.7] Out: 504   Physical Exam:  General: Alert CV: RRR Lungs: Clear Abdomen: soft, ND, NT Ext: NT, No erythema GU - exam similar to yesterday. Well demarcated desquamation of scrotum. The right hemiscrotum is enlarged, firm, and tender to palpation  Lab Results: Recent Labs    04/04/23 2246 04/05/23 0004 04/05/23 0312  HGB 12.6* 12.2* 10.7*  HCT 37.0* 36.0* 29.8*   BMET Recent Labs    04/04/23 1527 04/04/23 2246 04/05/23 0004 04/05/23 0312  NA 135   < > 137 137  K 4.3   < > 4.4 4.5  CL 102  --   --  100  CO2 23  --   --  24  GLUCOSE 143*  --   --  124*  BUN 34*  --   --  32*  CREATININE 1.81*  --   --  1.79*  CALCIUM 8.2*  --   --  8.3*   < > = values in this interval not displayed.     Studies/Results: DG Abd Portable 1V  Result Date: 04/04/2023 CLINICAL DATA:  Orogastric tube placement. EXAM: PORTABLE ABDOMEN - 1 VIEW COMPARISON:  Radiograph earlier today, CTA 03/29/2023 FINDINGS: The enteric tube tip and side port are below the diaphragm in the stomach, however the tip is directed towards the gastric cardia. Decreased gaseous gastric distension since earlier today. There is gaseous small bowel distention up to 4.4 cm. IMPRESSION: 1. The enteric tube tip and side port are below the diaphragm in the stomach, however the  tip is directed towards the gastric cardia. Consider repositioning if the tube will be used for feeding. 2. Gaseous small bowel distention up to 4.4 cm. Electronically Signed   By: Narda Rutherford M.D.   On: 04/04/2023 16:14   US SCROTUM W/DOPPLER  Addendum Date: 04/04/2023   ADDENDUM REPORT: 04/04/2023 12:31 ADDENDUM: Critical Value/emergent results were called by telephone at the time of interpretation on 04/04/2023 at 12:31 pm to provider [facility] , who verbally acknowledged these results. Electronically Signed   By: Lupita Raider M.D.   On: 04/04/2023 12:31   Result Date: 04/04/2023 CLINICAL DATA:  Abscess. EXAM: SCROTAL ULTRASOUND DOPPLER ULTRASOUND OF THE TESTICLES TECHNIQUE: Complete ultrasound examination of the testicles, epididymis, and other scrotal structures was performed. Color and spectral Doppler ultrasound were also utilized to evaluate blood flow to the testicles. COMPARISON:  CT scan of March 29, 2023. FINDINGS: Right testicle Measurements: 3.3 x 2.9 x 1.8 cm. No mass or microlithiasis visualized. Left testicle Measurements: 3.3 x 2.7 x 2.4 cm. No mass or microlithiasis visualized. Right epididymis:  Normal in size and appearance. Left epididymis: Tail may be slightly heterogeneous and enlarged suggesting possible epididymitis. Hydrocele:  None visualized. Varicocele:  None visualized. Pulsed Doppler interrogation of left demonstrates normal low resistance arterial and venous waveforms bilaterally. Flow could not be  detected in right testicle, but this may be due to overlying severe scrotal skin thickening and edema, although torsion cannot be excluded. IMPRESSION: No evidence of testicular mass. Doppler waveforms could not be obtained in the right testicle, with this may be due to overlying severe scrotal wall thickening and edema. However, portion of the right testicle cannot be excluded and clinical correlation is recommended. There is no evidence of torsion in the left  testicle. Left epididymal tail is slightly heterogeneous enlarged suggesting possible epididymitis. Electronically Signed: By: Lupita Raider M.D. On: 04/04/2023 12:21   CARDIAC CATHETERIZATION  Addendum Date: 04/04/2023   Findings: On milrinone 0.125 and iNO 40 ppm Ao = 141/56 RA = 13 RV = 93/15 PA = 96/34 (55) PCW = 13 Fick cardiac output/index = 7.3/3.3 Thermo CO/CI = 5.8/2.6 PVR = 5.8 (Fick) 7.2 (TD) SVR = 743 (Fick) 944 (TD) Ao sat = 99% PA sat = 71%, 74% PAPi = 4.8 Assessment: 1. PVR, cardiac output and PAPi significantly improved with milrinone and high-dose iNO with resultant increase in PA pressures 2. 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 Given only moderately elevated CO, I do not think ligating his AVF will reduce his PA pressures enough to change his outcome. At this point, would continue medical treatment and if he fails vent wean would suggest Palliative Care involvement. Arvilla Meres, MD 12:17 PM    Result Date: 04/04/2023 Findings: On milrinone 0.125 and iNO 40 ppm Ao = 141/56 RA = 13 RV = 93/15 PA = 96/34 (55) PCW = 13 Fick cardiac output/index = 7.3/3.3 Thermo CO/CI = 5.8/2.6 PVR = 5.8 (Fick) 7.2 (TD) SVR = 743 (Fick) 944 (TD) Ao sat = 99% PA sat = 71%, 74% PAPi = 4.8 Assessment: 1. PVR, cardiac output and PAPi significantly improved with milrinone and high-dose iNO with resultant increase in PA pressures 2. 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 Given only moderately elevated CO, I do not think ligating his AVF will reduce his PA pressures enough to change his outcome. At this point, would continue medical treatment and if he fails vent wean would suggest Palliative Care involvement. Arvilla Meres, MD 12:17 PM    DG Chest Port 1 View  Result Date: 04/04/2023 CLINICAL  DATA:  Acute respiratory failure, aspiration in the airway. Ileus. EXAM: PORTABLE CHEST 1 VIEW, ABDOMEN ONE VIEW COMPARISON:  03/31/2023, 04/01/2023. FINDINGS: The heart is enlarged and the mediastinal contour is stable. A prosthetic cardiac valve is noted. There is atherosclerotic calcification of the aorta. The pulmonary vasculature is mildly distended. Focal airspace disease is noted in the suprahilar region on the right. Mild atelectasis is present at the right lung base. No effusion or pneumothorax. A right internal jugular central venous catheter appear stable. An enteric tube courses over the left upper quadrant. The endotracheal tube terminates 4.2 cm above the carina. Surgical clips are noted in the cervical soft tissues. There is gaseous distention of the stomach with an enteric tube in place. Mildly distended loops of small bowel are noted in the abdomen measuring up to 3.8 cm. Rim calcified structure is noted in the right lower quadrant, compatible with known rim calcified renal cyst. No acute osseous abnormality is seen. IMPRESSION: 1. Cardiomegaly with mildly distended pulmonary vascularity. 2. Mild airspace disease in the suprahilar  region on the right. 3. Atelectasis at the right lung base. Support apparatus as described above. 4. Gaseous distention of the stomach with enteric tube in place. 5. Distended loops of small bowel in the abdomen measuring up to 4.2 cm, possible ileus or obstruction. Electronically Signed   By: Thornell Sartorius M.D.   On: 04/04/2023 03:36   DG Abd 1 View  Result Date: 04/04/2023 CLINICAL DATA:  Acute respiratory failure, aspiration in the airway. Ileus. EXAM: PORTABLE CHEST 1 VIEW, ABDOMEN ONE VIEW COMPARISON:  03/31/2023, 04/01/2023. FINDINGS: The heart is enlarged and the mediastinal contour is stable. A prosthetic cardiac valve is noted. There is atherosclerotic calcification of the aorta. The pulmonary vasculature is mildly distended. Focal airspace disease is noted in  the suprahilar region on the right. Mild atelectasis is present at the right lung base. No effusion or pneumothorax. A right internal jugular central venous catheter appear stable. An enteric tube courses over the left upper quadrant. The endotracheal tube terminates 4.2 cm above the carina. Surgical clips are noted in the cervical soft tissues. There is gaseous distention of the stomach with an enteric tube in place. Mildly distended loops of small bowel are noted in the abdomen measuring up to 3.8 cm. Rim calcified structure is noted in the right lower quadrant, compatible with known rim calcified renal cyst. No acute osseous abnormality is seen. IMPRESSION: 1. Cardiomegaly with mildly distended pulmonary vascularity. 2. Mild airspace disease in the suprahilar region on the right. 3. Atelectasis at the right lung base. Support apparatus as described above. 4. Gaseous distention of the stomach with enteric tube in place. 5. Distended loops of small bowel in the abdomen measuring up to 4.2 cm, possible ileus or obstruction. Electronically Signed   By: Thornell Sartorius M.D.   On: 04/04/2023 03:36   ECHOCARDIOGRAM LIMITED  Result Date: 04/03/2023    ECHOCARDIOGRAM LIMITED REPORT   Patient Name:   Timothy Palmer [facility] Date of Exam: 04/03/2023 Medical Rec #:  [id number]      Height:       71.0 in Accession #:    [id number]     Weight:       237.9 lb Date of Birth:  Nov 13, 1965     BSA:          2.270 m Patient Age:    56 years       BP:           115/46 mmHg Patient Gender: M              HR:           77 bpm. Exam Location:  Inpatient Procedure: Limited Echo, Limited Color Doppler and Cardiac Doppler Indications:    CHF-Acute Diastolic I50.31  History:        Patient has prior history of Echocardiogram examinations, most                 recent 03/29/2023. CHF, Shock, Mitral Valve Disease and Aortic                 Valve Disease, Arrythmias:Atrial Fibrillation and Tachycardia,                 Signs/Symptoms:Hypotension; Risk  Factors:Non-Smoker.                  Mitral Valve: 28 mm prosthetic annuloplasty ring valve is                 present in the  mitral position.  Sonographer:    Aron Baba Referring Phys: [id number] CHRISTOPHER L SCHUMANN IMPRESSIONS  1. Left ventricular ejection fraction, by estimation, is 40 to 45%. The left ventricle has mildly decreased function. The left ventricle demonstrates global hypokinesis. There is mild left ventricular hypertrophy of the inferior segment. Left ventricular diastolic function could not be evaluated. There is the interventricular septum is flattened in systole and diastole, consistent with right ventricular pressure and volume overload.  2. Right ventricular systolic function is moderately reduced. The right ventricular size is severely enlarged. Tricuspid regurgitation signal is inadequate for assessing PA pressure.  3. Right atrial size was severely dilated.  4. The mitral valve has been repaired/replaced. Mild mitral valve regurgitation. There is a 28 mm prosthetic annuloplasty ring present in the mitral position.  5. Tricuspid valve regurgitation is mild to moderate.  6. Known severe aortic stenosis on recent echo- aortic valve not well visualized.  7. The inferior vena cava is dilated in size with <50% respiratory variability, suggesting right atrial pressure of 15 mmHg. FINDINGS  Left Ventricle: Left ventricular ejection fraction, by estimation, is 40 to 45%. The left ventricle has mildly decreased function. The left ventricle demonstrates global hypokinesis. The left ventricular internal cavity size was normal in size. There is  mild left ventricular hypertrophy of the inferior segment. The interventricular septum is flattened in systole and diastole, consistent with right ventricular pressure and volume overload. Left ventricular diastolic function could not be evaluated. Right Ventricle: The right ventricular size is severely enlarged. No increase in right ventricular wall  thickness. Right ventricular systolic function is moderately reduced. Tricuspid regurgitation signal is inadequate for assessing PA pressure. Left Atrium: Left atrial size was not assessed. Right Atrium: Right atrial size was severely dilated. Pericardium: There is no evidence of pericardial effusion. Mitral Valve: The mitral valve has been repaired/replaced. Mild mitral valve regurgitation. There is a 28 mm prosthetic annuloplasty ring present in the mitral position. Tricuspid Valve: The tricuspid valve is normal in structure. Tricuspid valve regurgitation is mild to moderate. No evidence of tricuspid stenosis. Aortic Valve: Known severe aortic stenosis on recent echo- aortic valve not well visualized. Pulmonic Valve: The pulmonic valve was normal in structure. Pulmonic valve regurgitation is not visualized. No evidence of pulmonic stenosis. Aorta: The ascending aorta was not well visualized, the aortic arch was not well visualized and the aortic root was not well visualized. Venous: The inferior vena cava is dilated in size with less than 50% respiratory variability, suggesting right atrial pressure of 15 mmHg. IAS/Shunts: No atrial level shunt detected by color flow Doppler. Additional Comments: Spectral Doppler performed. Color Doppler performed.  LEFT VENTRICLE PLAX 2D LVIDd:         4.80 cm LVIDs:         3.80 cm LV PW:         1.20 cm LV IVS:        0.90 cm  RIGHT VENTRICLE RV S prime:     10.30 cm/s TAPSE (M-mode): 1.3 cm RIGHT ATRIUM           Index RA Area:     26.40 cm RA Volume:   93.40 ml  41.15 ml/m Lavona Mound Tobb DO Electronically signed by Thomasene Ripple DO Signature Date/Time: 04/03/2023/5:15:10 PM    Final     Assessment/Plan: Timothy Palmer is a 57 yo M with a scrotal wound. Similar in appearance today as yesterday --continue with conservative management. Will check again tomorrow. If exam worse  may order CT scan    LOS: 7 days   Irine Seal MD 04/05/2023, 1:53 PM [facility]  Pager:  [phone]

## 2023-04-05 NOTE — Progress Notes (Signed)
ANTICOAGULATION CONSULT NOTE  Pharmacy Consult for Heparin Indication: ACS vs VTE  No Known Allergies  Patient Measurements: Height: 5\' 11"  (180.3 cm) Weight: 105.2 kg (231 lb 14.8 oz) IBW/kg (Calculated) : 75.3 Heparin Dosing Weight: 100 kg  Vital Signs: Temp: 98.6 F (37 C) (09/28 0800) Temp Source: Axillary (09/28 0800) Pulse Rate: 137 (09/28 1200)  Labs: Recent Labs    04/03/23 0459 04/03/23 1555 04/04/23 0210 04/04/23 1011 04/04/23 1139 04/04/23 1527 04/04/23 2246 04/05/23 0004 04/05/23 0203 04/05/23 0312 04/05/23 0955  HGB 10.7*  --  11.1*  --    < >  --  12.6* 12.2*  --  10.7*  --   HCT 31.0*  --  31.8*  --    < >  --  37.0* 36.0*  --  29.8*  --   PLT 121*  --  128*  --   --   --   --   --   --  161  --   APTT 62*   < > 89* 81*  --   --   --   --  65*  --  97*  HEPARINUNFRC 0.16*  --  0.32 0.17*  --   --   --   --   --  0.23* 0.20*  CREATININE 2.07*   < > 1.24  --   --  1.81*  --   --   --  1.79*  --    < > = values in this interval not displayed.    Estimated Creatinine Clearance: 57.6 mL/min (A) (by C-G formula based on SCr of 1.79 mg/dL (H)).   Assessment: 43 YOM presenting with shock. Significant cardiac history including MSSA endocarditis, mitral regurgitation, aortic stenosis. Also hx of ESRD and DVT. Presented with elevated troponins and ECHO showed moderate RV dysfunction and RV enlargement. Pharmacy consulted to dose heparin to rule out PE with potential ACS. No AC PTA.  Heparin level 0.2 (not utilizing heparin levels for dosing as pt with elevated tbili which affects accuracy of heparin levels), aPTT 97 sec (supratherapeutic) on infusion at 2200 units/hr. No bleeding noted.  Goal of Therapy:  Heparin level 0.3-0.7 units/ml aPTT 66-102 seconds Monitor platelets by anticoagulation protocol: Yes   Plan:  Continue heparin infusion at 2200 units/hr  Will only monitor aPTT in this pt with elevated tbili. Will do confirmatory aPTT this  evening.  Thank you for involving pharmacy in the patient's care.   Christoper Fabian, PharmD, BCPS Please see amion for complete clinical pharmacist phone list 04/05/2023 12:40 PM

## 2023-04-05 NOTE — Progress Notes (Signed)
ANTICOAGULATION CONSULT NOTE  Pharmacy Consult for Heparin Indication: ACS vs VTE  No Known Allergies  Patient Measurements: Height: 5\' 11"  (180.3 cm) Weight: 105.2 kg (231 lb 14.8 oz) IBW/kg (Calculated) : 75.3 Heparin Dosing Weight: 100 kg  Vital Signs: Temp: 99 F (37.2 C) (09/27 2310) Temp Source: Axillary (09/27 2310) Pulse Rate: 99 (09/28 0300)  Labs: Recent Labs    04/02/23 0441 04/02/23 0454 04/03/23 0459 04/03/23 1555 04/04/23 0210 04/04/23 1011 04/04/23 1139 04/04/23 1527 04/04/23 2246 04/05/23 0004 04/05/23 0203  HGB 10.4*  --  10.7*  --  11.1*  --  12.6*  12.9*  --  12.6* 12.2*  --   HCT 29.6*  --  31.0*  --  31.8*  --  37.0*  38.0*  --  37.0* 36.0*  --   PLT 125*  --  121*  --  128*  --   --   --   --   --   --   APTT 74*  --  62* 55* 89* 81*  --   --   --   --  65*  HEPARINUNFRC  --    < > 0.16*  --  0.32 0.17*  --   --   --   --   --   CREATININE  --    < > 2.07* 1.97* 1.24  --   --  1.81*  --   --   --    < > = values in this interval not displayed.    Estimated Creatinine Clearance: 56.9 mL/min (A) (by C-G formula based on SCr of 1.81 mg/dL (H)).   Assessment: 36 YOM presenting with shock. Significant cardiac history including MSSA endocarditis, mitral regurgitation, aortic stenosis. Also hx of ESRD and DVT. Presented with elevated troponins and ECHO showed moderate RV dysfunction and RV enlargement. Pharmacy consulted to dose heparin to rule out PE with potential ACS. No AC PTA  Heparin held for [facility] cath, restarting 8 hours post sheath removal. aPTT previously therapeutic at 81 seconds on 2100 units/hr. No signs of bleeding per RN. CBC stable.   9/28 AM: aPTT 65 seconds on 2100 units/hr (slightly below goal). Per RN, no issues with heparin running continuously or signs/symptoms of bleeding. Plts 128, Hgb 12s  Goal of Therapy:  Heparin level 0.3-0.7 units/ml aPTT 66-102 seconds Monitor platelets by anticoagulation protocol: Yes   Plan:   Increase heparin infusion to 2200 units/hr  Check aPTT in 8 hours and daily while on heparin Continue to monitor H&H and platelets  Thank you for involving pharmacy in the patient's care.   Arabella Merles, PharmD. Clinical Pharmacist 04/05/2023 3:07 AM

## 2023-04-05 NOTE — Progress Notes (Signed)
ABG reviewed.  Pt evaluated at bedside.  Looks about the same. Still tachypneic but appears comfortable on heated high flow Plan Will cont current supportive care Ideally would like to keep him extubated if possible   Simonne Martinet ACNP-BC [facility] Pulmonary/Critical Care Pager # [phone] OR # [phone] if no answer

## 2023-04-05 NOTE — IPAL (Signed)
  Interdisciplinary Goals of Care Family Meeting   Date carried out:: 04/05/2023  Location of the meeting: Bedside  Member's involved: Physician, Bedside Registered Nurse, and Family Member or next of kin  Durable Power of Attorney or acting medical decision maker: Sister Timothy Palmer     Discussion: We discussed goals of care for Timothy Palmer .    Patient has severe, end-stage PAH with RV failure.  I reviewed his clinical course including status post right heart catheterization yesterday.  Reviewed recommendations of advanced heart failure for palliation.  His sister understands that his very ill with poor prognosis.  She knows that putting him back on the ventilator would not help him and has agreed to DNR status.  Will continue supporting him with CVVH, milrinone pressors and other measures for now.  Code status: Full DNR  Disposition: Continue current acute care   Time spent for the meeting: 10 mins  Chilton Greathouse MD [facility] See Amion for pager  If no response to pager , please call [phone] until 7pm After 7:00 pm call Elink  [phone] 04/05/2023, 10:21 AM

## 2023-04-05 NOTE — Progress Notes (Signed)
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  He went into A-fib with RVR with heart rate ranging in 200s, he became hypotensive which improved after a few minutes, he is on vasopressor support He is being started on amiodarone  Goals of care discussions were done this morning with family about not putting him back on ventilator which I agree as he has minimal chances of recovery despite maximal medical therapy.  I spoke with patient's sister explained that his condition is deteriorating quickly, his heart might stop at some point, recommend if anybody from family would like to visit him, this will be the time.  Patient's sister understood and she is calling her family members. Will continue current care for now while keeping him DNR/DNI     Cheri Fowler, MD [facility] See Amion for pager If no response to pager, please call [phone] until 7pm After 7pm, Please call E-link [phone]

## 2023-04-05 NOTE — Progress Notes (Addendum)
[name] NEPHROLOGY PROGRESS NOTE  Assessment/ Plan: Pt is a 57 y.o. yo male   OP HD: [facility] (on hd since 2005)  4.5hr  450/1.5    119kg  2/2 bath  AVF LUA   Heparin 5000 - last OP HD 9/19, post wt 118.4kg  - rocaltrol 0.75 mcg po tts - sensipar 150 mg po tts - no esa (last Hb 11.6 on 9/19)  # Shock septic vs cardiogenic - on IV abx and pressor support per PCCM.  # Severe AS / RV dysfunction/V tach: Status post TEE on 9/23 with no vegetation however so severe AAS, severe RV systolic dysfunction with elevated pulmonary pressure.  RHC on 9/27 with severe pulmonary artery hypertension despite of milrinone, severe and end-stage PAH/cor pulmonale, recommended medical treatment and palliative consult.  # ESRD - HD TTS.  Potassium level acceptable, continue CRRT with UF as tolerated.  # Hyperkalemia initially which was improved with CRRT and now adjusting potassium bath for hypokalemia.  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  No binders while on CRRT.  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  No vegetation and TEE.  # VDRF: Self extubated.  # Hypophosphatemia: Replete K-Phos.  Subjective: Seen and examined in ICU.  Seen by palliative care team and patient is now DNR.  No issue with CRRT.  Plan to UF today, discussed with HD nurse.  Objective Vital signs in last 24 hours: Vitals:   04/05/23 0915 04/05/23 0930 04/05/23 0945 04/05/23 1000  BP:      Pulse: (!) 102 (!) 103 (!) 101 99  Resp: (!) 45 (!) 34 (!) 22 (!) 47  Temp:      TempSrc:      SpO2: 100% 100% 100% 100%  Weight:      Height:       Weight change: 0 kg  Intake/Output Summary (Last 24 hours) at 04/05/2023 1122 Last data filed at 04/05/2023 1100 Gross per 24 hour  Intake 1925.55 ml  Output 1222.6 ml  Net 702.95 ml       Labs: RENAL PANEL Recent Labs  Lab 04/01/23 1542 04/01/23 1718 04/02/23 0441  04/02/23 0454 04/03/23 0459 04/03/23 1124 04/03/23 1555 04/04/23 0210 04/04/23 1139 04/04/23 1527 04/04/23 2246 04/05/23 0004 04/05/23 0312  NA 136   < >  --    < > 135  --  135 132* 138  138 135 137 137 137  K 3.4*   < >  --    < > 3.2*  --  3.8 3.4* 4.2  4.3 4.3 4.4 4.4 4.5  CL 97*  --   --    < > 104  --  99 98  --  102  --   --  100  CO2 21*  --   --    < > 23  --  21* 22  --  23  --   --  24  GLUCOSE 145*  --   --    < > 169*  --  225* 235*  --  143*  --   --  124*  BUN 33*  --   --    < > 27*  --  29* 31*  --  34*  --   --  32*  CREATININE 2.42*  --   --    < > 2.07*  --  1.97* 1.24  --  1.81*  --   --  1.79*  CALCIUM 7.7*  --   --    < > 7.8*  --  8.1* 8.4*  --  8.2*  --   --  8.3*  MG 2.6*  --  2.6*  --  2.5*  --   --  2.5*  --   --   --   --  2.6*  PHOS 3.1  --  2.7   < > 2.6  --  2.3* 2.4*  --  2.8  --   --  3.3  ALBUMIN 1.8*  --   --    < > 1.7* 2.1* 1.9* 2.0*  2.0*  --  1.8*  --   --  2.0*   < > = values in this interval not displayed.    Liver Function Tests: Recent Labs  Lab 04/03/23 1124 04/03/23 1555 04/04/23 0210 04/04/23 1527 04/05/23 0312  AST 209*  --  151*  --  98*  ALT 276*  --  240*  --  170*  ALKPHOS 167*  --  178*  --  160*  BILITOT 16.5*  --  19.0*  --  20.0*  PROT 7.8  --  7.8  --  7.6  ALBUMIN 2.1*   < > 2.0*  2.0* 1.8* 2.0*   < > = values in this interval not displayed.   Recent Labs  Lab 03/29/23 1158  LIPASE 41  AMYLASE 71   No results for input(s): "AMMONIA" in the last 168 hours. CBC: Recent Labs    04/04/23 0210 04/04/23 1139 04/04/23 2246 04/05/23 0004 04/05/23 0312  HGB 11.1* 12.6*  12.9* 12.6* 12.2* 10.7*  MCV 81.1  --   --   --  81.9    Cardiac Enzymes: No results for input(s): "CKTOTAL", "CKMB", "CKMBINDEX", "TROPONINI" in the last 168 hours. CBG: Recent Labs  Lab 04/04/23 1525 04/04/23 1928 04/04/23 2310 04/05/23 0305 04/05/23 0726  GLUCAP 143* 139* 134* 126* 112*    Iron Studies: No results for  input(s): "IRON", "TIBC", "TRANSFERRIN", "FERRITIN" in the last 72 hours. Studies/Results: DG Abd Portable 1V  Result Date: 04/04/2023 CLINICAL DATA:  Orogastric tube placement. EXAM: PORTABLE ABDOMEN - 1 VIEW COMPARISON:  Radiograph earlier today, CTA 03/29/2023 FINDINGS: The enteric tube tip and side port are below the diaphragm in the stomach, however the tip is directed towards the gastric cardia. Decreased gaseous gastric distension since earlier today. There is gaseous small bowel distention up to 4.4 cm. IMPRESSION: 1. The enteric tube tip and side port are below the diaphragm in the stomach, however the tip is directed towards the gastric cardia. Consider repositioning if the tube will be used for feeding. 2. Gaseous small bowel distention up to 4.4 cm. Electronically Signed   By: Narda Rutherford M.D.   On: 04/04/2023 16:14   US SCROTUM W/DOPPLER  Addendum Date: 04/04/2023   ADDENDUM REPORT: 04/04/2023 12:31 ADDENDUM: Critical Value/emergent results were called by telephone at the time of interpretation on 04/04/2023 at 12:31 pm to provider [facility] , who verbally acknowledged these results. Electronically Signed   By: Lupita Raider M.D.   On: 04/04/2023 12:31   Result Date: 04/04/2023 CLINICAL DATA:  Abscess. EXAM: SCROTAL ULTRASOUND DOPPLER ULTRASOUND OF THE TESTICLES TECHNIQUE: Complete ultrasound examination of the testicles, epididymis, and other scrotal structures was performed. Color and spectral Doppler ultrasound were also utilized to evaluate blood flow to the testicles. COMPARISON:  CT scan of March 29, 2023. FINDINGS: Right  testicle Measurements: 3.3 x 2.9 x 1.8 cm. No mass or microlithiasis visualized. Left testicle Measurements: 3.3 x 2.7 x 2.4 cm. No mass or microlithiasis visualized. Right epididymis:  Normal in size and appearance. Left epididymis: Tail may be slightly heterogeneous and enlarged suggesting possible epididymitis. Hydrocele:  None visualized. Varicocele:   None visualized. Pulsed Doppler interrogation of left demonstrates normal low resistance arterial and venous waveforms bilaterally. Flow could not be detected in right testicle, but this may be due to overlying severe scrotal skin thickening and edema, although torsion cannot be excluded. IMPRESSION: No evidence of testicular mass. Doppler waveforms could not be obtained in the right testicle, with this may be due to overlying severe scrotal wall thickening and edema. However, portion of the right testicle cannot be excluded and clinical correlation is recommended. There is no evidence of torsion in the left testicle. Left epididymal tail is slightly heterogeneous enlarged suggesting possible epididymitis. Electronically Signed: By: Lupita Raider M.D. On: 04/04/2023 12:21   CARDIAC CATHETERIZATION  Addendum Date: 04/04/2023   Findings: On milrinone 0.125 and iNO 40 ppm Ao = 141/56 RA = 13 RV = 93/15 PA = 96/34 (55) PCW = 13 Fick cardiac output/index = 7.3/3.3 Thermo CO/CI = 5.8/2.6 PVR = 5.8 (Fick) 7.2 (TD) SVR = 743 (Fick) 944 (TD) Ao sat = 99% PA sat = 71%, 74% PAPi = 4.8 Assessment: 1. PVR, cardiac output and PAPi significantly improved with milrinone and high-dose iNO with resultant increase in PA pressures 2. 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 Given only moderately elevated CO, I do not think ligating his AVF will reduce his PA pressures enough to change his outcome. At this point, would continue medical treatment and if he fails vent wean would suggest Palliative Care involvement. Arvilla Meres, MD 12:17 PM    Result Date: 04/04/2023 Findings: On milrinone 0.125 and iNO 40 ppm Ao = 141/56 RA = 13 RV = 93/15 PA = 96/34 (55) PCW = 13 Fick cardiac output/index = 7.3/3.3 Thermo CO/CI = 5.8/2.6 PVR = 5.8 (Fick) 7.2 (TD) SVR = 743 (Fick) 944 (TD) Ao sat = 99% PA sat = 71%, 74% PAPi = 4.8  Assessment: 1. PVR, cardiac output and PAPi significantly improved with milrinone and high-dose iNO with resultant increase in PA pressures 2. 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 Given only moderately elevated CO, I do not think ligating his AVF will reduce his PA pressures enough to change his outcome. At this point, would continue medical treatment and if he fails vent wean would suggest Palliative Care involvement. Arvilla Meres, MD 12:17 PM    DG Chest Port 1 View  Result Date: 04/04/2023 CLINICAL DATA:  Acute respiratory failure, aspiration in the airway. Ileus. EXAM: PORTABLE CHEST 1 VIEW, ABDOMEN ONE VIEW COMPARISON:  03/31/2023, 04/01/2023. FINDINGS: The heart is enlarged and the mediastinal contour is stable. A prosthetic cardiac valve is noted. There is atherosclerotic calcification of the aorta. The pulmonary vasculature is mildly distended. Focal airspace disease is noted in the suprahilar region on the right. Mild atelectasis is present at the right lung base. No effusion or pneumothorax. A right internal jugular central venous catheter appear stable. An enteric tube courses over the left upper quadrant. The endotracheal tube terminates 4.2 cm above the carina. Surgical clips are  noted in the cervical soft tissues. There is gaseous distention of the stomach with an enteric tube in place. Mildly distended loops of small bowel are noted in the abdomen measuring up to 3.8 cm. Rim calcified structure is noted in the right lower quadrant, compatible with known rim calcified renal cyst. No acute osseous abnormality is seen. IMPRESSION: 1. Cardiomegaly with mildly distended pulmonary vascularity. 2. Mild airspace disease in the suprahilar region on the right. 3. Atelectasis at the right lung base. Support apparatus as described above. 4. Gaseous distention of the stomach with enteric tube in place. 5.  Distended loops of small bowel in the abdomen measuring up to 4.2 cm, possible ileus or obstruction. Electronically Signed   By: Thornell Sartorius M.D.   On: 04/04/2023 03:36   DG Abd 1 View  Result Date: 04/04/2023 CLINICAL DATA:  Acute respiratory failure, aspiration in the airway. Ileus. EXAM: PORTABLE CHEST 1 VIEW, ABDOMEN ONE VIEW COMPARISON:  03/31/2023, 04/01/2023. FINDINGS: The heart is enlarged and the mediastinal contour is stable. A prosthetic cardiac valve is noted. There is atherosclerotic calcification of the aorta. The pulmonary vasculature is mildly distended. Focal airspace disease is noted in the suprahilar region on the right. Mild atelectasis is present at the right lung base. No effusion or pneumothorax. A right internal jugular central venous catheter appear stable. An enteric tube courses over the left upper quadrant. The endotracheal tube terminates 4.2 cm above the carina. Surgical clips are noted in the cervical soft tissues. There is gaseous distention of the stomach with an enteric tube in place. Mildly distended loops of small bowel are noted in the abdomen measuring up to 3.8 cm. Rim calcified structure is noted in the right lower quadrant, compatible with known rim calcified renal cyst. No acute osseous abnormality is seen. IMPRESSION: 1. Cardiomegaly with mildly distended pulmonary vascularity. 2. Mild airspace disease in the suprahilar region on the right. 3. Atelectasis at the right lung base. Support apparatus as described above. 4. Gaseous distention of the stomach with enteric tube in place. 5. Distended loops of small bowel in the abdomen measuring up to 4.2 cm, possible ileus or obstruction. Electronically Signed   By: Thornell Sartorius M.D.   On: 04/04/2023 03:36   ECHOCARDIOGRAM LIMITED  Result Date: 04/03/2023    ECHOCARDIOGRAM LIMITED REPORT   Patient Name:   Timothy Palmer [facility] Date of Exam: 04/03/2023 Medical Rec #:  [id number]      Height:       71.0 in Accession #:     [id number]     Weight:       237.9 lb Date of Birth:  [birth year]     BSA:          2.270 m Patient Age:    56 years       BP:           115/46 mmHg Patient Gender: M              HR:           77 bpm. Exam Location:  Inpatient Procedure: Limited Echo, Limited Color Doppler and Cardiac Doppler Indications:    CHF-Acute Diastolic I50.31  History:        Patient has prior history of Echocardiogram examinations, most                 recent 03/29/2023. CHF, Shock, Mitral Valve Disease and Aortic  Valve Disease, Arrythmias:Atrial Fibrillation and Tachycardia,                 Signs/Symptoms:Hypotension; Risk Factors:Non-Smoker.                  Mitral Valve: 28 mm prosthetic annuloplasty ring valve is                 present in the mitral position.  Sonographer:    Aron Baba Referring Phys: [id number] CHRISTOPHER L SCHUMANN IMPRESSIONS  1. Left ventricular ejection fraction, by estimation, is 40 to 45%. The left ventricle has mildly decreased function. The left ventricle demonstrates global hypokinesis. There is mild left ventricular hypertrophy of the inferior segment. Left ventricular diastolic function could not be evaluated. There is the interventricular septum is flattened in systole and diastole, consistent with right ventricular pressure and volume overload.  2. Right ventricular systolic function is moderately reduced. The right ventricular size is severely enlarged. Tricuspid regurgitation signal is inadequate for assessing PA pressure.  3. Right atrial size was severely dilated.  4. The mitral valve has been repaired/replaced. Mild mitral valve regurgitation. There is a 28 mm prosthetic annuloplasty ring present in the mitral position.  5. Tricuspid valve regurgitation is mild to moderate.  6. Known severe aortic stenosis on recent echo- aortic valve not well visualized.  7. The inferior vena cava is dilated in size with <50% respiratory variability, suggesting right atrial pressure of 15 mmHg.  FINDINGS  Left Ventricle: Left ventricular ejection fraction, by estimation, is 40 to 45%. The left ventricle has mildly decreased function. The left ventricle demonstrates global hypokinesis. The left ventricular internal cavity size was normal in size. There is  mild left ventricular hypertrophy of the inferior segment. The interventricular septum is flattened in systole and diastole, consistent with right ventricular pressure and volume overload. Left ventricular diastolic function could not be evaluated. Right Ventricle: The right ventricular size is severely enlarged. No increase in right ventricular wall thickness. Right ventricular systolic function is moderately reduced. Tricuspid regurgitation signal is inadequate for assessing PA pressure. Left Atrium: Left atrial size was not assessed. Right Atrium: Right atrial size was severely dilated. Pericardium: There is no evidence of pericardial effusion. Mitral Valve: The mitral valve has been repaired/replaced. Mild mitral valve regurgitation. There is a 28 mm prosthetic annuloplasty ring present in the mitral position. Tricuspid Valve: The tricuspid valve is normal in structure. Tricuspid valve regurgitation is mild to moderate. No evidence of tricuspid stenosis. Aortic Valve: Known severe aortic stenosis on recent echo- aortic valve not well visualized. Pulmonic Valve: The pulmonic valve was normal in structure. Pulmonic valve regurgitation is not visualized. No evidence of pulmonic stenosis. Aorta: The ascending aorta was not well visualized, the aortic arch was not well visualized and the aortic root was not well visualized. Venous: The inferior vena cava is dilated in size with less than 50% respiratory variability, suggesting right atrial pressure of 15 mmHg. IAS/Shunts: No atrial level shunt detected by color flow Doppler. Additional Comments: Spectral Doppler performed. Color Doppler performed.  LEFT VENTRICLE PLAX 2D LVIDd:         4.80 cm LVIDs:          3.80 cm LV PW:         1.20 cm LV IVS:        0.90 cm  RIGHT VENTRICLE RV S prime:     10.30 cm/s TAPSE (M-mode): 1.3 cm RIGHT ATRIUM  Index RA Area:     26.40 cm RA Volume:   93.40 ml  41.15 ml/m Kardie Tobb DO Electronically signed by Thomasene Ripple DO Signature Date/Time: 04/03/2023/5:15:10 PM    Final     Medications: Infusions:   prismasol BGK 4/2.5 400 mL/hr at 04/05/23 1022    prismasol BGK 4/2.5 400 mL/hr at 04/05/23 1101   sodium chloride     ceFEPime (MAXIPIME) IV Stopped (04/05/23 0841)   feeding supplement (PIVOT 1.5 CAL) Stopped (04/04/23 0110)   heparin 2,200 Units/hr (04/05/23 1100)   milrinone 0.125 mcg/kg/min (04/05/23 1100)   norepinephrine (LEVOPHED) Adult infusion 27 mcg/min (04/05/23 1100)   prismasol BGK 4/2.5 1,500 mL/hr at 04/05/23 0801   vasopressin 0.04 Units/min (04/05/23 1100)    Scheduled Medications:  calcitRIOL  0.75 mcg Per Tube Q T,Th,Sat-1800   Chlorhexidine Gluconate Cloth  6 each Topical Daily   insulin aspart  0-15 Units Subcutaneous Q4H   multivitamin  1 tablet Per Tube QHS   pantoprazole (PROTONIX) IV  40 mg Intravenous Daily   sodium chloride flush  3 mL Intravenous Q12H    have reviewed scheduled and prn medications.  Physical Exam: General: Ill looking male, extubated, quite agitated. Heart:RRR, s1s2 nl Lungs: Coarse breath sound bilateral. Abdomen:soft, Non-tender, non-distended Extremities: Trace peripheral edema Dialysis Access: L AVF.  RIJ temp HD cath placed on 9/21.   Broady Lafoy Elpidio Anis Piper [place] 04/05/2023,11:22 AM  LOS: 7 days

## 2023-04-05 NOTE — Progress Notes (Signed)
NAME:  Timothy Palmer, MRN:  [id number], DOB:  [birth year], LOS: 7 ADMISSION DATE:  03/29/2023, CONSULTATION DATE:  03/29/2023 REFERRING MD: EDP, CHIEF COMPLAINT: Shock  History of Present Illness:   57 year old male patient with history of ESRD ( T, Th, Sat) , Systolic and diastolic CHF, HTN, Mitral valve repair ( 2015) and replacement 2/2 MSSA  endocarditis(2016), Mycotic aneurysm, Upper extremity DVT(2015) and PVD. Presented to the Ed 9/21 with history or 4 days of diarrhea which resolved 9/20, with poor po intake and fatigue with weakness and body aches. In the ED he had Lactate of 11.9, Troponin of 900, bedside echo by ED MD showed decreased EF. Last echo 2016 showed EF of 50-55%. He is not followed by cardiology. He is not on blood thinners as they caused his HD graph to bleed. PCCM were notified by ED MD  and asked to admit the patient and manage care .  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   Significant Hospital Events: Including procedures, antibiotic start and stop dates in addition to other pertinent events   03/29/2023 Admission  - Stated he is weak and tired, has no energy.Currently on 2 L [place] with sats of 100%,HD is due  today, 9/21> echo with severe RV enlargement and pulmonary hypertension severe arctic stenosis that is calcified.  Nephrology started CRRT due to shock.  Patient went on pressors.  HD cath placed.  Later intubated.  Also had had wide-complex rhythm agonal respirations but appears did not lose any pulses.  Received cardioversion received stat hyperkalemia treatment.  Status post arterial line insertion 09/24: Heart cath showing severe right heart failure.  Started on inhaled nitric oxide and milrinone 9/28 repeat right heart cath shows some improvement but still has severe pulmonary hypertension.  No more options for advanced heart failure who has signed off.  Urology consulted for scrotal wound.  Interim History / Subjective:   Self extubated overnight.  Did not need reintubation.  Currently on high flow nasal cannula Inhaled nitric oxide stopped as it could not be given to high flow nasal cannula   Objective   Blood pressure 139/70, pulse (!) 103, temperature 98.9 F (37.2 C), temperature source Axillary, resp. rate (!) 40, height 5\' 11"  (1.803 m), weight 105.2 kg, SpO2 100%.  Vent Mode: PRVC FiO2 (%):  [30 %-80 %] 70 % Set Rate:  [20 bmp] 20 bmp Vt Set:  [600 mL] 600 mL PEEP:  [5 cmH20] 5 cmH20 Plateau Pressure:  [23 cmH20-25 cmH20] 23 cmH20   Intake/Output Summary (Last 24 hours) at 04/05/2023 0753 Last data filed at 04/05/2023 0700 Gross per 24 hour  Intake 1798.55 ml  Output 1627.6 ml  Net 170.95 ml   Filed Weights   04/03/23 0500 04/04/23 0500 04/05/23 0446  Weight: 107.9 kg 105.2 kg 105.2 kg    Examination: Gen:      No acute distress HEENT:  EOMI, sclera anicteric Neck:  No masses; no thyromegaly Lungs:    Clear to auscultation bilaterally; normal respiratory effort CV:         Regular rate and rhythm; no murmurs Abd:      + bowel sounds; soft, non-tender; no palpable masses, no distension Ext:    No edema; adequate peripheral perfusion Skin:      Scrotal skin  sloughing Neuro: Awake, nods to commands  Lab/imaging review Significant for BUN/creatinine 32/1.79 AST 98, ALT 170 Total bilirubin 20 WBC count increased to 26.3, hemoglobin 10.7, platelets 161 No new imaging   Resolved Hospital Problem list     Assessment & Plan:   #Combined shock with cardiogenic and septic #Severe aortic valve stenosis Right heart cath X 2 and patient still has severe pulmonary artery hypertension despite milrinone and high-dose  iNO.  No further measures can be offered for advanced heart failure who have signed off -Continue cefepime day 7  -Continue inotropic support with milrinone per cardiology -Continue Levophed and vasopressin -MAP goal greater than 65 -Cardiology following, appreciate recommendations -Continue heparin -Continue serial COOX measurements  #Acute hypoxemic respiratory failure Likely secondary to above.  There is also concern for concomitant aspiration pneumonia as patient aspirated yesterday.  Will extend treatment for 2 more days with cefepime.  Continues with stable postextubation -Continue cefepime day 8 of 10 -Continue high flow nasal cannula  #Ileus Patient had some aspiration yesterday.  KUB showed evidence of ileus.   -Keep n.p.o.  #Scrotal edema and skin sloughing On this patient for scrotal edema and skin sloughing.  Exam today, patient does have worsening skin sloughing. -Appreciate urology recommendation.  Will continue monitoring.  No need for any procedure at present -Not felt to have torsion on scrotal ultrasound  #History of V. Tach #Concern for a flutter No acute concerns today.  Patient seems to be in sinus rhythm. -Continue telemetry  #ESRD #Hypokalemia #Hyponatremia Patient doing well with CRRT.  -Continue CRRT per nephrology  #Hyperbilirubinemia Bilirubin up to 20.  This likely secondary to congestive hepatopathy.  Patient does have some scleral icterus. -Continue to monitor  #Goals of  care Initiated discussion with sister and will get palliative involved.  Best Practice (right click and "Reselect all SmartList Selections" daily)   Diet/type: tubefeeds DVT prophylaxis: systemic heparin GI prophylaxis: PPI Lines: Central line Foley:  Yes, and it is still needed Code Status:  full code  Critical care time:    The patient is critically ill with multiple organ system failure and requires high complexity decision making for assessment and support, frequent evaluation and titration of therapies, advanced monitoring, review of radiographic studies and interpretation of complex data.   Critical Care Time devoted to patient care services, exclusive of separately billable procedures, described in this note is 35 minutes.   Chilton Greathouse MD [facility] See Amion for pager  If no response to pager , please call [phone] until 7pm After 7:00 pm call Elink  [phone] 04/05/2023, 8:10 AM

## 2023-04-05 NOTE — Progress Notes (Signed)
ANTICOAGULATION CONSULT NOTE  Pharmacy Consult for Heparin Indication: ACS vs VTE  No Known Allergies  Patient Measurements: Height: 5\' 11"  (180.3 cm) Weight: 105.2 kg (231 lb 14.8 oz) IBW/kg (Calculated) : 75.3 Heparin Dosing Weight: 100 kg  Vital Signs: Temp: 100.1 F (37.8 C) (09/28 1615) Temp Source: Axillary (09/28 1615) Pulse Rate: 149 (09/28 1630)  Labs: Recent Labs    04/03/23 0459 04/03/23 1555 04/04/23 0210 04/04/23 1011 04/04/23 1139 04/04/23 1527 04/04/23 2246 04/05/23 0004 04/05/23 0203 04/05/23 0312 04/05/23 0955 04/05/23 1537  HGB 10.7*  --  11.1*  --    < >  --  12.6* 12.2*  --  10.7*  --   --   HCT 31.0*  --  31.8*  --    < >  --  37.0* 36.0*  --  29.8*  --   --   PLT 121*  --  128*  --   --   --   --   --   --  161  --   --   APTT 62*   < > 89* 81*  --   --   --   --  65*  --  97* 89*  HEPARINUNFRC 0.16*  --  0.32 0.17*  --   --   --   --   --  0.23* 0.20*  --   CREATININE 2.07*   < > 1.24  --   --  1.81*  --   --   --  1.79*  --  1.11   < > = values in this interval not displayed.    Estimated Creatinine Clearance: 92.8 mL/min (by C-G formula based on SCr of 1.11 mg/dL).   Assessment: 63 YOM presenting with shock. Significant cardiac history including MSSA endocarditis, mitral regurgitation, aortic stenosis. Also hx of ESRD and DVT. Presented with elevated troponins and ECHO showed moderate RV dysfunction and RV enlargement. Pharmacy consulted to dose heparin to rule out PE with potential ACS. No AC PTA.  Heparin level 0.2 (not utilizing heparin levels for dosing as pt with elevated tbili which affects accuracy of heparin levels), aPTT 97 sec (supratherapeutic) on infusion at 2200 units/hr. No bleeding noted.  9/28 PM update:  aPTT 89 sec is therapeutic on 2200 units/hr.  No infusion issues or bleeding noted.  Goal of Therapy:  Heparin level 0.3-0.7 units/ml aPTT 66-102 seconds Monitor platelets by anticoagulation protocol: Yes   Plan:   Continue heparin infusion at 2200 units/hr  Will only monitor aPTT in this pt with elevated tbili.  Daily aPTT, CBC  Thank you for involving pharmacy in the patient's care.   Trixie Rude, PharmD Clinical Pharmacist 04/05/2023  4:41 PM

## 2023-04-06 ENCOUNTER — Inpatient Hospital Stay (HOSPITAL_COMMUNITY): Payer: Medicare Other

## 2023-04-06 DIAGNOSIS — R579 Shock, unspecified: Secondary | ICD-10-CM | POA: Diagnosis not present

## 2023-04-06 DIAGNOSIS — Z7189 Other specified counseling: Secondary | ICD-10-CM

## 2023-04-06 DIAGNOSIS — Z515 Encounter for palliative care: Secondary | ICD-10-CM

## 2023-04-06 LAB — COOXEMETRY PANEL
Carboxyhemoglobin: 2.3 % — ABNORMAL HIGH (ref 0.5–1.5)
Methemoglobin: <0.7 % (ref 0.0–1.5)
O2 Saturation: 73.2 %
Total hemoglobin: 9.9 g/dL — ABNORMAL LOW (ref 12.0–16.0)

## 2023-04-06 LAB — COMPREHENSIVE METABOLIC PANEL
ALT: 119 U/L — ABNORMAL HIGH (ref 0–44)
AST: 71 U/L — ABNORMAL HIGH (ref 15–41)
Albumin: 2.7 g/dL — ABNORMAL LOW (ref 3.5–5.0)
Alkaline Phosphatase: 137 U/L — ABNORMAL HIGH (ref 38–126)
Anion gap: 13 (ref 5–15)
BUN: 27 mg/dL — ABNORMAL HIGH (ref 6–20)
CO2: 22 mmol/L (ref 22–32)
Calcium: 8.4 mg/dL — ABNORMAL LOW (ref 8.9–10.3)
Chloride: 99 mmol/L (ref 98–111)
Creatinine, Ser: 0.83 mg/dL (ref 0.61–1.24)
GFR, Estimated: >60 mL/min (ref >60)
Glucose, Bld: 131 mg/dL — ABNORMAL HIGH (ref 70–99)
Potassium: 4 mmol/L (ref 3.5–5.1)
Sodium: 134 mmol/L — ABNORMAL LOW (ref 135–145)
Total Bilirubin: 21 mg/dL (ref 0.3–1.2)
Total Protein: 7.9 g/dL (ref 6.5–8.1)

## 2023-04-06 LAB — GLUCOSE, CAPILLARY
Glucose-Capillary: 103 mg/dL — ABNORMAL HIGH (ref 70–99)
Glucose-Capillary: 123 mg/dL — ABNORMAL HIGH (ref 70–99)
Glucose-Capillary: 109 mg/dL — ABNORMAL HIGH (ref 70–99)
Glucose-Capillary: 121 mg/dL — ABNORMAL HIGH (ref 70–99)
Glucose-Capillary: 128 mg/dL — ABNORMAL HIGH (ref 70–99)
Glucose-Capillary: 94 mg/dL (ref 70–99)

## 2023-04-06 LAB — MAGNESIUM: Magnesium: 2.6 mg/dL — ABNORMAL HIGH (ref 1.7–2.4)

## 2023-04-06 LAB — APTT
aPTT: 115 s — ABNORMAL HIGH (ref 24–36)
aPTT: 67 s — ABNORMAL HIGH (ref 24–36)

## 2023-04-06 LAB — RENAL FUNCTION PANEL
Albumin: 2.9 g/dL — ABNORMAL LOW (ref 3.5–5.0)
Anion gap: 15 (ref 5–15)
BUN: 25 mg/dL — ABNORMAL HIGH (ref 6–20)
CO2: 20 mmol/L — ABNORMAL LOW (ref 22–32)
Calcium: 8.4 mg/dL — ABNORMAL LOW (ref 8.9–10.3)
Chloride: 98 mmol/L (ref 98–111)
Creatinine, Ser: 1.11 mg/dL (ref 0.61–1.24)
GFR, Estimated: >60 mL/min (ref >60)
Glucose, Bld: 135 mg/dL — ABNORMAL HIGH (ref 70–99)
Phosphorus: 2.4 mg/dL — ABNORMAL LOW (ref 2.5–4.6)
Potassium: 4.3 mmol/L (ref 3.5–5.1)
Sodium: 133 mmol/L — ABNORMAL LOW (ref 135–145)

## 2023-04-06 LAB — CBC
HCT: 27.2 % — ABNORMAL LOW (ref 39.0–52.0)
Hemoglobin: 9.6 g/dL — ABNORMAL LOW (ref 13.0–17.0)
MCH: 28.2 pg (ref 26.0–34.0)
MCHC: 35.3 g/dL (ref 30.0–36.0)
MCV: 80 fL (ref 80.0–100.0)
Platelets: 149 10*3/uL — ABNORMAL LOW (ref 150–400)
RBC: 3.4 MIL/uL — ABNORMAL LOW (ref 4.22–5.81)
RDW: 19 % — ABNORMAL HIGH (ref 11.5–15.5)
WBC: 25.5 10*3/uL — ABNORMAL HIGH (ref 4.0–10.5)
nRBC: 11.5 % — ABNORMAL HIGH (ref 0.0–0.2)

## 2023-04-06 LAB — PHOSPHORUS: Phosphorus: 3.4 mg/dL (ref 2.5–4.6)

## 2023-04-06 NOTE — Progress Notes (Signed)
[name] NEPHROLOGY PROGRESS NOTE  Assessment/ Plan: Pt is a 57 y.o. yo male   OP HD: [facility] (on hd since 2005)  4.5hr  450/1.5    119kg  2/2 bath  AVF LUA   Heparin 5000 - last OP HD 9/19, post wt 118.4kg  - rocaltrol 0.75 mcg po tts - sensipar 150 mg po tts - no esa (last Hb 11.6 on 9/19)  # Shock septic vs cardiogenic - on IV abx and pressor support per PCCM.  # Severe AS / RV dysfunction/V tach: Status post TEE on 9/23 with no vegetation however so severe AAS, severe RV systolic dysfunction with elevated pulmonary pressure.  RHC on 9/27 with severe pulmonary artery hypertension despite of milrinone, severe and end-stage PAH/cor pulmonale, recommended medical treatment and palliative consult.  # ESRD - HD TTS.  Currently on CRRT.  Still requiring high pressor support to maintain BP.  I do not think he can tolerate intermittent HD and still looks volume up, we will continue to UF with CRRT.  Given end-stage cardiac disease and multiple comorbidities, the prognosis looks poor.  Palliative care is already on board.  # Hyperkalemia initially which was improved with CRRT and now adjusting potassium bath for hypokalemia.  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  No binders while on CRRT.  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  No vegetation and TEE.  # VDRF: Self extubated.  # Hypophosphatemia: Repleted K-Phos.  Phos level acceptable today.  Subjective: Seen and examined in ICU.  Seen by palliative care team.  Patient is DNR.  On high flow oxygen.  Still on milrinone, Levophed, vasopressin. Objective Vital signs in last 24 hours: Vitals:   04/06/23 0900 04/06/23 0915 04/06/23 0930 04/06/23 0945  BP:      Pulse: 95 85 86 86  Resp: (!) 41 20 (!) 27 (!) 27  Temp:      TempSrc:      SpO2: 100% 100% 100% 100%  Weight:      Height:       Weight change: 0 kg  Intake/Output  Summary (Last 24 hours) at 04/06/2023 1001 Last data filed at 04/06/2023 0900 Gross per 24 hour  Intake 2551.36 ml  Output 1729 ml  Net 822.36 ml       Labs: RENAL PANEL Recent Labs  Lab 04/02/23 0441 04/02/23 0454 04/03/23 0459 04/03/23 1124 04/04/23 0210 04/04/23 1139 04/04/23 1527 04/04/23 2246 04/05/23 0004 04/05/23 0312 04/05/23 1537 04/06/23 0310  NA  --    < > 135   < > 132*   < > 135 137 137 137 135 134*  K  --    < > 3.2*   < > 3.4*   < > 4.3 4.4 4.4 4.5 4.6 4.0  CL  --    < > 104   < > 98  --  102  --   --  100 100 99  CO2  --    < > 23   < > 22  --  23  --   --  24 23 22   GLUCOSE  --    < > 169*   < > 235*  --  143*  --   --  124* 144* 131*  BUN  --    < > 27*   < > 31*  --  34*  --   --  32* 31* 27*  CREATININE  --    < > 2.07*   < > 1.24  --  1.81*  --   --  1.79* 1.11 0.83  CALCIUM  --    < > 7.8*   < > 8.4*  --  8.2*  --   --  8.3* 8.3* 8.4*  MG 2.6*  --  2.5*  --  2.5*  --   --   --   --  2.6*  --  2.6*  PHOS 2.7   < > 2.6   < > 2.4*  --  2.8  --   --  3.3 3.1 3.4  ALBUMIN  --    < > 1.7*   < > 2.0*  2.0*  --  1.8*  --   --  2.0* 2.4* 2.7*   < > = values in this interval not displayed.    Liver Function Tests: Recent Labs  Lab 04/04/23 0210 04/04/23 1527 04/05/23 0312 04/05/23 1139 04/05/23 1537 04/06/23 0310  AST 151*  --  98*  --   --  71*  ALT 240*  --  170*  --   --  119*  ALKPHOS 178*  --  160*  --   --  137*  BILITOT 19.0*  --  20.0* 21.0*  --  21.0*  PROT 7.8  --  7.6  --   --  7.9  ALBUMIN 2.0*  2.0*   < > 2.0*  --  2.4* 2.7*   < > = values in this interval not displayed.   No results for input(s): "LIPASE", "AMYLASE" in the last 168 hours.  No results for input(s): "AMMONIA" in the last 168 hours. CBC: Recent Labs    04/04/23 1139 04/04/23 2246 04/05/23 0004 04/05/23 0312 04/06/23 0310  HGB 12.6*  12.9* 12.6* 12.2* 10.7* 9.6*  MCV  --   --   --  81.9 80.0    Cardiac Enzymes: No results for input(s): "CKTOTAL", "CKMB",  "CKMBINDEX", "TROPONINI" in the last 168 hours. CBG: Recent Labs  Lab 04/05/23 1534 04/05/23 1932 04/05/23 2310 04/06/23 0317 04/06/23 0728  GLUCAP 135* 138* 130* 103* 123*    Iron Studies: No results for input(s): "IRON", "TIBC", "TRANSFERRIN", "FERRITIN" in the last 72 hours. Studies/Results: DG Abd Portable 1V  Result Date: 04/04/2023 CLINICAL DATA:  Orogastric tube placement. EXAM: PORTABLE ABDOMEN - 1 VIEW COMPARISON:  Radiograph earlier today, CTA 03/29/2023 FINDINGS: The enteric tube tip and side port are below the diaphragm in the stomach, however the tip is directed towards the gastric cardia. Decreased gaseous gastric distension since earlier today. There is gaseous small bowel distention up to 4.4 cm. IMPRESSION: 1. The enteric tube tip and side port are below the diaphragm in the stomach, however the tip is directed towards the gastric cardia. Consider repositioning if the tube will be used for feeding. 2. Gaseous small bowel distention up to 4.4 cm. Electronically Signed   By: Narda Rutherford M.D.   On: 04/04/2023 16:14   US SCROTUM W/DOPPLER  Addendum Date: 04/04/2023   ADDENDUM REPORT: 04/04/2023 12:31 ADDENDUM: Critical Value/emergent results were called by telephone at the time of interpretation on 04/04/2023 at 12:31 pm to provider [facility] , who verbally acknowledged these results. Electronically Signed   By: Lupita Raider M.D.   On: 04/04/2023 12:31   Result Date: 04/04/2023 CLINICAL DATA:  Abscess. EXAM: SCROTAL ULTRASOUND DOPPLER ULTRASOUND OF THE TESTICLES TECHNIQUE: Complete ultrasound examination of the testicles,  epididymis, and other scrotal structures was performed. Color and spectral Doppler ultrasound were also utilized to evaluate blood flow to the testicles. COMPARISON:  CT scan of March 29, 2023. FINDINGS: Right testicle Measurements: 3.3 x 2.9 x 1.8 cm. No mass or microlithiasis visualized. Left testicle Measurements: 3.3 x 2.7 x 2.4 cm. No mass or  microlithiasis visualized. Right epididymis:  Normal in size and appearance. Left epididymis: Tail may be slightly heterogeneous and enlarged suggesting possible epididymitis. Hydrocele:  None visualized. Varicocele:  None visualized. Pulsed Doppler interrogation of left demonstrates normal low resistance arterial and venous waveforms bilaterally. Flow could not be detected in right testicle, but this may be due to overlying severe scrotal skin thickening and edema, although torsion cannot be excluded. IMPRESSION: No evidence of testicular mass. Doppler waveforms could not be obtained in the right testicle, with this may be due to overlying severe scrotal wall thickening and edema. However, portion of the right testicle cannot be excluded and clinical correlation is recommended. There is no evidence of torsion in the left testicle. Left epididymal tail is slightly heterogeneous enlarged suggesting possible epididymitis. Electronically Signed: By: Lupita Raider M.D. On: 04/04/2023 12:21   CARDIAC CATHETERIZATION  Addendum Date: 04/04/2023   Findings: On milrinone 0.125 and iNO 40 ppm Ao = 141/56 RA = 13 RV = 93/15 PA = 96/34 (55) PCW = 13 Fick cardiac output/index = 7.3/3.3 Thermo CO/CI = 5.8/2.6 PVR = 5.8 (Fick) 7.2 (TD) SVR = 743 (Fick) 944 (TD) Ao sat = 99% PA sat = 71%, 74% PAPi = 4.8 Assessment: 1. PVR, cardiac output and PAPi significantly improved with milrinone and high-dose iNO with resultant increase in PA pressures 2. 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 Given only moderately elevated CO, I do not think ligating his AVF will reduce his PA pressures enough to change his outcome. At this point, would continue medical treatment and if he fails vent wean would suggest Palliative Care involvement. Arvilla Meres, MD 12:17 PM    Result Date: 04/04/2023 Findings: On milrinone 0.125 and iNO  40 ppm Ao = 141/56 RA = 13 RV = 93/15 PA = 96/34 (55) PCW = 13 Fick cardiac output/index = 7.3/3.3 Thermo CO/CI = 5.8/2.6 PVR = 5.8 (Fick) 7.2 (TD) SVR = 743 (Fick) 944 (TD) Ao sat = 99% PA sat = 71%, 74% PAPi = 4.8 Assessment: 1. PVR, cardiac output and PAPi significantly improved with milrinone and high-dose iNO with resultant increase in PA pressures 2. 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 Given only moderately elevated CO, I do not think ligating his AVF will reduce his PA pressures enough to change his outcome. At this point, would continue medical treatment and if he fails vent wean would suggest Palliative Care involvement. Arvilla Meres, MD 12:17 PM     Medications: Infusions:   prismasol BGK 4/2.5 400 mL/hr at 04/05/23 2159    prismasol BGK 4/2.5 400 mL/hr at 04/05/23 2200   sodium chloride     amiodarone 30 mg/hr (04/06/23 0900)   ceFEPime (MAXIPIME) IV Stopped (04/06/23 0801)   feeding supplement (PIVOT 1.5 CAL) Stopped (04/04/23 0110)   heparin 2,000 Units/hr (04/06/23 0900)   milrinone 0.125 mcg/kg/min (04/06/23 0900)   norepinephrine (LEVOPHED) Adult infusion 26 mcg/min (04/06/23 0900)   prismasol BGK 4/2.5 1,500  mL/hr at 04/06/23 0941   vasopressin 0.04 Units/min (04/06/23 0900)    Scheduled Medications:  calcitRIOL  0.75 mcg Per Tube Q T,Th,Sat-1800   Chlorhexidine Gluconate Cloth  6 each Topical Daily   insulin aspart  0-15 Units Subcutaneous Q4H   multivitamin  1 tablet Per Tube QHS   pantoprazole (PROTONIX) IV  40 mg Intravenous Daily   sodium chloride flush  3 mL Intravenous Q12H    have reviewed scheduled and prn medications.  Physical Exam: General: Ill looking male, extubated, quite agitated. Heart:RRR, s1s2 nl Lungs: Coarse breath sound bilateral. Abdomen:soft, Non-tender, non-distended Extremities: Trace peripheral edema Dialysis Access: L AVF.  RIJ temp HD  cath placed on 9/21.   Timothy Palmer Prasad Keeli Roberg 04/06/2023,10:01 AM  LOS: 8 days

## 2023-04-06 NOTE — Consult Note (Cosign Needed Addendum)
Palliative Medicine Inpatient Consult Note  Consulting Provider: Chilton Greathouse, MD   Reason for consult:   Palliative Care Consult Services Palliative Medicine Consult  Reason for Consult? [place] of care   04/06/2023  HPI:  Per intake H&P --> 57 year old male patient with history of ESRD ( T, Th, Sat) , Systolic and diastolic CHF, HTN, Mitral valve repair ( 2015) and replacement 2/2 MSSA endocarditis(2016), Mycotic aneurysm, Upper extremity DVT(2015) and PVD. Has been hospitalized for the past seven days and is being treated for septic and cardiogenic shock. The PMT has been consulted to assist with additional goals of care conversations.   Clinical Assessment/Goals of Care:  *Please note that this is a verbal dictation therefore any spelling or grammatical errors are due to the "Dragon Medical One" system interpretation.  I have reviewed medical records including EPIC notes, labs and imaging, received report from bedside RN, assessed the patient who is lying in bed critically ill.    I called patients sister, Timothy Palmer to further discuss diagnosis prognosis, GOC, EOL wishes, disposition and options.   I introduced Palliative Medicine as specialized medical care for people living with serious illness. It focuses on providing relief from the symptoms and stress of a serious illness. The goal is to improve quality of life for both the patient and the family.  Medical History Review and Understanding:  A review of Timothy Palmer past medical history was held reviewed his end-stage renal disease for which she is on dialysis, his open heart surgery-mitral valve repair, heart failure, hypertension, prior history of endocarditis, and aortic stenosis.  Social History:  Timothy Palmer is from [place] [facility]  He is separated from his wife.  Timothy Palmer has 3 children.  He is a man who is known to be vehemently independent.  He previously worked for [employer].  He is a man of faith and practices within  Christianity.  Functional and Nutritional State:  Preceding hospitalization Timothy Palmer was living independently performing all B ADLs and IADLs.  He did have a hearty appetite.  Per his sister his activities were getting more and more difficult though she did not understand the severity until later.  Advance Directives:  A detailed discussion was had today regarding advanced directives.  Clement and his Sister Timothy Palmer helps with decisions and coordinating with the patient's family so everyone hears the same information at the same time.  There is no identified healthcare power of attorney paperwork.  Code Status:  Concepts specific to code status, artifical feeding and hydration, continued IV antibiotics and rehospitalization was had.  The difference between a aggressive medical intervention path  and a palliative comfort care path for this patient at this time was had.   Timothy Palmer is a DO NOT RESUSCITATE DO NOT INTUBATE CODE STATUS.  Discussion:  We reviewed that Timothy Palmer has not been doing well for quite some time in the setting of a dyspnea.  He has though been able to compensate and get around in his home as well as drive.  Per Timothy Palmer he was having more trouble last week with things like getting off of the toilet and going to his regularly scheduled hemodialysis treatments.  He had asked Timothy Palmer to help him get to treatment when Timothy Palmer found him on the floor.  We reviewed the complex hospitalization that Timothy Palmer has had in the setting of both septic and cardiogenic shock.  We discussed the level of support he is on presently in regards to cardiac medications.  We reviewed that he  is on multiple pressor medications.  We reviewed that he is no longer intubated and Timothy Palmer confirms she and her family do not want him to be reintubated.  We reviewed short-term and long-term goals.  We discussed is very likely Timothy Palmer will decline further and if so tough conversations would need to be held in decisions made  regarding next steps such as keeping him comfortable and allow him to die peacefully.  However shares that she has lost 2 siblings both of which have been difficult and painful deaths for she and her family.  Timothy Palmer understanding of Timothy Palmer condition.  We discussed if things should worsen a family meeting would be held and decisions would be made from there.  We discussed what comfort focused care would look like in the context of end-of-life.  At this point., we will continue to allow time for outcomes  Discussed the importance of continued conversation with family and their  medical providers regarding overall plan of care and treatment options, ensuring decisions are within the context of the patients values and GOCs. _______________________ Addendum:  I met with patients sister, Timothy Palmer and niece at bedside. We reviewed his present situation and the concern for poor outcomes. Timothy Palmer wants the primary team to speak to the patient directly to guide direction of care.   I attempted to speak to Timothy Palmer this afternoon thought it does not seem he understands consistently what he is being told.   Additional Time: 15 minutes  Decision Maker: [place],Timothy Palmer (Sister): [phone] (Mobile)   SUMMARY OF RECOMMENDATIONS   DNAR/DNI  Open and honest conversations held in the setting of Timothy Palmer illness -patient's sister is aware of his tenuous health state and possible decompensation moving forward  Should patient's condition change patient's sister, Timothy Palmer would like to be communicated with so that she may share this information with his family  Plan to continue current care allowing time for outcomes  Appreciate Chaplain support  Palliative care will continue to follow along and support Timothy Palmer and his family during this difficult time  Code Status/Advance Care Planning: DNAR/DNI   Palliative Prophylaxis:  Aspiration, Bowel Regimen, Delirium Protocol, Frequent Pain Assessment,  Oral Care, Palliative Wound Care, and Turn Reposition  Additional Recommendations (Limitations, Scope, Preferences): Continue current care  Psycho-social/Spiritual:  Desire for further Chaplaincy support: Yes  Additional Recommendations: Education on heart failure progression   Prognosis: Poor overall. Patient high risk for deterioration  Discharge Planning: Uncertain presently  Vitals:   04/06/23 0500 04/06/23 0600  BP:    Pulse: 81 82  Resp: (!) 44 (!) 31  Temp:    SpO2: 100% 100%    Intake/Output Summary (Last 24 hours) at 04/06/2023 0649 Last data filed at 04/06/2023 0600 Gross per 24 hour  Intake 2598.58 ml  Output 1575 ml  Net 1023.58 ml   Last Weight  Most recent update: 04/06/2023  6:13 AM    Weight  105.2 kg (231 lb 14.8 oz)            Gen:  Middle aged AA M critically ill appearing HEENT: Dry mucous membranes CV: Regular rate and rhythm  PULM: On 45LPM HFNC, breathing is even and nonlabored ABD: slight distention though nontender  EXT: Generalized edema  Neuro: Opens eyes to name, tries to speak  PPS: 10%-20%   This conversation/these recommendations were discussed with patient primary care team, Dr. Isaiah Serge  Billing based on MDM: High  Problems Addressed: One acute or chronic illness or injury that poses a threat to  life or bodily function  Amount and/or Complexity of Data: Category 3:Discussion of management or test interpretation with external physician/other qualified health care professional/appropriate source (not separately reported)  Risks: Decision regarding hospitalization or escalation of hospital care and Decision not to resuscitate or to de-escalate care because of poor prognosis ______________________________________________________ [name] [facility] Palliative Medicine Team Team Cell Phone: [phone] Please utilize secure chat with additional questions, if there is no response within 30 minutes please call the above  phone number  Palliative Medicine Team providers are available by phone from 7am to 7pm daily and can be reached through the team cell phone.  Should this patient require assistance outside of these hours, please call the patient's attending physician.

## 2023-04-06 NOTE — Plan of Care (Signed)
  Problem: Education: Goal: Knowledge of General Education information will improve Description: Including pain rating scale, medication(s)/side effects and non-pharmacologic comfort measures Outcome: Not Progressing   Problem: Health Behavior/Discharge Planning: Goal: Ability to manage health-related needs will improve Outcome: Not Progressing   Problem: Clinical Measurements: Goal: Ability to maintain clinical measurements within normal limits will improve Outcome: Not Progressing Goal: Will remain free from infection Outcome: Progressing Goal: Diagnostic test results will improve Outcome: Progressing

## 2023-04-06 NOTE — Progress Notes (Signed)
ANTICOAGULATION CONSULT NOTE  Pharmacy Consult for Heparin Indication: ACS vs VTE  No Known Allergies  Patient Measurements: Height: 5\' 11"  (180.3 cm) Weight: 105.2 kg (231 lb 14.8 oz) IBW/kg (Calculated) : 75.3 Heparin Dosing Weight: 100 kg  Vital Signs: Temp: 97.5 F (36.4 C) (09/29 0317) Temp Source: Axillary (09/29 0317) Pulse Rate: 82 (09/29 0600)  Labs: Recent Labs    04/04/23 0210 04/04/23 1011 04/04/23 1139 04/05/23 0004 04/05/23 0203 04/05/23 0312 04/05/23 0955 04/05/23 1537 04/06/23 0310  HGB 11.1*  --    < > 12.2*  --  10.7*  --   --  9.6*  HCT 31.8*  --    < > 36.0*  --  29.8*  --   --  27.2*  PLT 128*  --   --   --   --  161  --   --  149*  APTT 89* 81*  --   --    < >  --  97* 89* 115*  HEPARINUNFRC 0.32 0.17*  --   --   --  0.23* 0.20*  --   --   CREATININE 1.24  --    < >  --   --  1.79*  --  1.11 0.83   < > = values in this interval not displayed.    Estimated Creatinine Clearance: 124.2 mL/min (by C-G formula based on SCr of 0.83 mg/dL).   Assessment: 15 YOM presenting with shock. Significant cardiac history including MSSA endocarditis, mitral regurgitation, aortic stenosis. Also hx of ESRD and DVT. Presented with elevated troponins and ECHO showed moderate RV dysfunction and RV enlargement. Pharmacy consulted to dose heparin to rule out PE with potential ACS. No AC PTA.  Heparin level 0.2 (not utilizing heparin levels for dosing as pt with elevated tbili which affects accuracy of heparin levels), aPTT 97 sec (supratherapeutic) on infusion at 2200 units/hr. No bleeding noted.  9/29 AM update:  aPTT 115 sec is therapeutic on 2200 units/hr.  No infusion issues or bleeding per RN. Hgb and plts slightly dropped  Goal of Therapy:  Heparin level 0.3-0.7 units/ml aPTT 66-102 seconds Monitor platelets by anticoagulation protocol: Yes   Plan:  Decrease heparin infusion to 2000 units/hr  6h aPTT Will only monitor aPTT in this pt with elevated tbili.   Daily aPTT, CBC  Thank you for involving pharmacy in the patient's care.   Arabella Merles, PharmD. Clinical Pharmacist 04/06/2023 6:58 AM

## 2023-04-06 NOTE — Progress Notes (Signed)
2 Days Post-Op Subjective: No acute events overnight. He is extubated but with poor prognosis. Palliative care following.   Objective: Vital signs in last 24 hours: Temp:  [97.1 F (36.2 C)-100.1 F (37.8 C)] 98.4 F (36.9 C) (09/29 0744) Pulse Rate:  [74-160] 84 (09/29 1045) Resp:  [9-59] 39 (09/29 1045) SpO2:  [95 %-100 %] 100 % (09/29 1045) Arterial Line BP: (82-150)/(42-75) 112/45 (09/29 1045) FiO2 (%):  [35 %-50 %] 35 % (09/29 0800) Weight:  [105.2 kg] 105.2 kg (09/29 0500)  Intake/Output from previous day: 09/28 0701 - 09/29 0700 In: 2593.6 [I.V.:2079.1; IV Piggyback:514.5] Out: 1715  Intake/Output this shift: Total I/O In: 481.5 [I.V.:307.1; IV Piggyback:174.4] Out: 571   Physical Exam:  General: Alert CV: RRR Lungs: Clear Abdomen: soft, ND, NT Ext: NT, No erythema GU - exam similar to yesterday, perhaps has worsened slightly. Well demarcated desquamation of scrotum. The right hemiscrotum is enlarged, firm, and tender to palpation  Lab Results: Recent Labs    04/05/23 0004 04/05/23 0312 04/06/23 0310  HGB 12.2* 10.7* 9.6*  HCT 36.0* 29.8* 27.2*   BMET Recent Labs    04/05/23 1537 04/06/23 0310  NA 135 134*  K 4.6 4.0  CL 100 99  CO2 23 22  GLUCOSE 144* 131*  BUN 31* 27*  CREATININE 1.11 0.83  CALCIUM 8.3* 8.4*     Studies/Results: US Abdomen Limited RUQ (LIVER/GB)  Result Date: 04/06/2023 CLINICAL DATA:  Elevated LFTs EXAM: ULTRASOUND ABDOMEN LIMITED RIGHT UPPER QUADRANT COMPARISON:  CT 03/29/2023 FINDINGS: Gallbladder: Gallbladder wall thickening up to 5.8 mm. Layering sludge in the gallbladder without definite cholelithiasis. Sonographer reports no sonographic Murphy sign. No pericholecystic fluid noted. Common bile duct: Diameter: 4.6 mm.  No intrahepatic biliary ductal dilatation. Liver: No focal lesion identified. Within normal limits in parenchymal echogenicity. Portal vein is patent on color Doppler imaging with normal direction of blood  flow towards the liver. Other: Right renal cysts measuring up to 6.9 cm as previously noted. IMPRESSION: Gallbladder sludge and wall thickening without definite cholelithiasis. If there is clinical concern for acute cholecystitis, consider nuclear medicine hepatobiliary scan. Electronically Signed   By: Corlis Leak M.D.   On: 04/06/2023 11:01   DG Chest Port 1 View  Result Date: 04/06/2023 CLINICAL DATA:  Acute respiratory failure. EXAM: PORTABLE CHEST 1 VIEW COMPARISON:  04/04/2023 FINDINGS: There is a right IJ catheter with tip in the projection of the SVC. Left IJ catheter is also noted with tip in the SVC. Unchanged cardiac enlargement. Decreased lung volumes with asymmetric elevation of the right hemidiaphragm. Diffuse increased pulmonary vascularity is identified. Unchanged bilateral upper lobe perihilar opacities which may represent infiltrate or asymmetric edema. No focal airspace opacities. IMPRESSION: 1. Cardiomegaly and pulmonary vascular congestion. 2. Persistent bilateral perihilar opacities. 3. Decreased lung volumes with asymmetric elevation of the right hemidiaphragm. Electronically Signed   By: Signa Kell M.D.   On: 04/06/2023 10:52   DG Abd Portable 1V  Result Date: 04/04/2023 CLINICAL DATA:  Orogastric tube placement. EXAM: PORTABLE ABDOMEN - 1 VIEW COMPARISON:  Radiograph earlier today, CTA 03/29/2023 FINDINGS: The enteric tube tip and side port are below the diaphragm in the stomach, however the tip is directed towards the gastric cardia. Decreased gaseous gastric distension since earlier today. There is gaseous small bowel distention up to 4.4 cm. IMPRESSION: 1. The enteric tube tip and side port are below the diaphragm in the stomach, however the tip is directed towards the gastric cardia. Consider repositioning if the  tube will be used for feeding. 2. Gaseous small bowel distention up to 4.4 cm. Electronically Signed   By: Narda Rutherford M.D.   On: 04/04/2023 16:14   CARDIAC  CATHETERIZATION  Addendum Date: 04/04/2023   Findings: On milrinone 0.125 and iNO 40 ppm Ao = 141/56 RA = 13 RV = 93/15 PA = 96/34 (55) PCW = 13 Fick cardiac output/index = 7.3/3.3 Thermo CO/CI = 5.8/2.6 PVR = 5.8 (Fick) 7.2 (TD) SVR = 743 (Fick) 944 (TD) Ao sat = 99% PA sat = 71%, 74% PAPi = 4.8 Assessment: 1. PVR, cardiac output and PAPi significantly improved with milrinone and high-dose iNO with resultant increase in PA pressures 2. 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 Given only moderately elevated CO, I do not think ligating his AVF will reduce his PA pressures enough to change his outcome. At this point, would continue medical treatment and if he fails vent wean would suggest Palliative Care involvement. Arvilla Meres, MD 12:17 PM    Result Date: 04/04/2023 Findings: On milrinone 0.125 and iNO 40 ppm Ao = 141/56 RA = 13 RV = 93/15 PA = 96/34 (55) PCW = 13 Fick cardiac output/index = 7.3/3.3 Thermo CO/CI = 5.8/2.6 PVR = 5.8 (Fick) 7.2 (TD) SVR = 743 (Fick) 944 (TD) Ao sat = 99% PA sat = 71%, 74% PAPi = 4.8 Assessment: 1. PVR, cardiac output and PAPi significantly improved with milrinone and high-dose iNO with resultant increase in PA pressures 2. 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 Given only moderately elevated CO, I do not think ligating his AVF will reduce his PA pressures enough to change his outcome. At this point, would continue medical treatment and if he fails vent wean would suggest Palliative Care involvement. Arvilla Meres, MD 12:17 PM     Assessment/Plan: Timothy Palmer is a 57 yo M with a scrotal wound. Overall exam is similar, desquamation has perhaps progressed mildly --continue with conservative management. Patient with poor prognosis, do not think any other imaging  would be useful at this time --I will sign off but please call with any questions   LOS: 8 days   Irine Seal MD 04/06/2023, 11:13 AM [facility]  Pager: [phone]

## 2023-04-06 NOTE — Progress Notes (Signed)
ANTICOAGULATION CONSULT NOTE  Pharmacy Consult for Heparin Indication: ACS vs VTE  No Known Allergies  Patient Measurements: Height: 5\' 11"  (180.3 cm) Weight: 105.2 kg (231 lb 14.8 oz) IBW/kg (Calculated) : 75.3 Heparin Dosing Weight: 100 kg  Vital Signs: Temp: 98.4 F (36.9 C) (09/29 1117) Temp Source: Oral (09/29 1117) Pulse Rate: 83 (09/29 1445)  Labs: Recent Labs    04/04/23 0210 04/04/23 1011 04/04/23 1139 04/05/23 0004 04/05/23 0203 04/05/23 0312 04/05/23 0955 04/05/23 1537 04/06/23 0310 04/06/23 1257  HGB 11.1*  --    < > 12.2*  --  10.7*  --   --  9.6*  --   HCT 31.8*  --    < > 36.0*  --  29.8*  --   --  27.2*  --   PLT 128*  --   --   --   --  161  --   --  149*  --   APTT 89* 81*  --   --    < >  --  97* 89* 115* 67*  HEPARINUNFRC 0.32 0.17*  --   --   --  0.23* 0.20*  --   --   --   CREATININE 1.24  --    < >  --   --  1.79*  --  1.11 0.83  --    < > = values in this interval not displayed.    Estimated Creatinine Clearance: 124.2 mL/min (by C-G formula based on SCr of 0.83 mg/dL).   Assessment: 27 YOM presenting with shock. Significant cardiac history including MSSA endocarditis, mitral regurgitation, aortic stenosis. Also hx of ESRD and DVT. Presented with elevated troponins and ECHO showed moderate RV dysfunction and RV enlargement. Pharmacy consulted to dose heparin to rule out PE with potential ACS. No AC PTA.  aPTT 67 sec (low end of therapeutic). Utilizing aPTT for monitoring as elevated tbili effects heparin levels. No bleeding noted.  Goal of Therapy:  aPTT 66-102 sec Monitor platelets by anticoagulation protocol: Yes   Plan:  Continue heparin infusion at 2000 units/hr  Will only monitor aPTT in this pt with elevated tbili.  Daily aPTT, CBC  Thank you for involving pharmacy in the patient's care.   Christoper Fabian, PharmD, BCPS Please see amion for complete clinical pharmacist phone list 04/06/2023 3:25 PM

## 2023-04-06 NOTE — Progress Notes (Addendum)
NAME:  Timothy Palmer, MRN:  [id number], DOB:  16-Jul-1965, LOS: 8 ADMISSION DATE:  03/29/2023, CONSULTATION DATE:  03/29/2023 REFERRING MD: EDP, CHIEF COMPLAINT: Shock  History of Present Illness:   57 year old male patient with history of ESRD ( T, Th, Sat) , Systolic and diastolic CHF, HTN, Mitral valve repair ( 2015) and replacement 2/2 MSSA  endocarditis(2016), Mycotic aneurysm, Upper extremity DVT(2015) and PVD. Presented to the Ed 9/21 with history or 4 days of diarrhea which resolved 9/20, with poor po intake and fatigue with weakness and body aches. In the ED he had Lactate of 11.9, Troponin of 900, bedside echo by ED MD showed decreased EF. Last echo 2016 showed EF of 50-55%. He is not followed by cardiology. He is not on blood thinners as they caused his HD graph to bleed. PCCM were notified by ED MD  and asked to admit the patient and manage care .  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   Significant Hospital Events: Including procedures, antibiotic start and stop dates in addition to other pertinent events   03/29/2023 Admission  - Stated he is weak and tired, has no energy.Currently on 2 L [place] with sats of 100%,HD is due  today, 9/21> echo with severe RV enlargement and pulmonary hypertension severe arctic stenosis that is calcified.  Nephrology started CRRT due to shock.  Patient went on pressors.  HD cath placed.  Later intubated.  Also had had wide-complex rhythm agonal respirations but appears did not lose any pulses.  Received cardioversion received stat hyperkalemia treatment.  Status post arterial line insertion 09/24: Heart cath showing severe right heart failure.  Started on inhaled nitric oxide and milrinone 9/28 repeat right heart cath shows some improvement but still has severe pulmonary hypertension.  No more options for advanced heart failure who has signed off.  Urology consulted for scrotal wound. 9/29 Self extubated. Made DNR after discussion with family  Interim History / Subjective:   Remains on pressors, milrinone, high flow nasal cannula Episode of rapid atrial fibrillation yesterday and started on amiodarone Back in normal sinus rhythm  Objective   Blood pressure 139/70, pulse 86, temperature 98.4 F (36.9 C), temperature source Oral, resp. rate (!) 30, height 5\' 11"  (1.803 m), weight 105.2 kg, SpO2 100%.  FiO2 (%):  [35 %-50 %] 35 %   Intake/Output Summary (Last 24 hours) at 04/06/2023 0827 Last data filed at 04/06/2023 0800 Gross per 24 hour  Intake 2701.95 ml  Output 1879 ml  Net 822.95 ml   Filed Weights   04/04/23 0500 04/05/23 0446 04/06/23 0500  Weight: 105.2 kg 105.2 kg 105.2 kg    Examination: Gen:      No acute distress, chronically ill appearing HEENT:  EOMI, sclera anicteric Neck:     No masses; no thyromegaly Lungs:    Clear to auscultation bilaterally; normal respiratory effort CV:         Regular  rate and rhythm; no murmurs Abd:      + bowel sounds; soft, non-tender; no palpable masses, no distension Ext:    No edema; adequate peripheral perfusion Skin:      Scrotal skin sloughing Neuro: Somnolent arousable  Lab/imaging reviewed Significant for AST 71, ALT  119 Total bilirubin greater than 21 WBC 25.5, hemoglobin 9.6, platelets 149   Resolved Hospital Problem list     Assessment & Plan:   #Combined shock with cardiogenic and septic #Severe aortic valve stenosis Right heart cath X 2 and patient still has severe pulmonary artery hypertension despite milrinone and high-dose  iNO.  No further measures can be offered for advanced heart failure who have signed off -Continue cefepime day 9 -Continue inotropic support with milrinone -Continue Levophed and vasopressin -MAP goal greater than 65 -Continue serial COOX measurements  #Acute hypoxemic respiratory failure Likely secondary to above.  There is also concern for concomitant aspiration pneumonia as patient aspirated yesterday.  Will extend treatment for 2 more days with cefepime.  Continues with stable postextubation -Continue cefepime day 9 of 10 -Continue high flow nasal cannula. Wean down as tolerated  #Ileus KUB showed evidence of ileus.   -Keep n.p.o. Replace NG tube and place to suction.  #Scrotal edema and skin sloughing On this patient for scrotal edema and skin sloughing.  Exam today, patient does have worsening skin sloughing. -Appreciate urology recommendation.  Will continue monitoring.  No need for any procedure at present -Not felt to have torsion on scrotal ultrasound  #History of V. Tach #A flutter Patient seems to be in sinus rhythm. - Continue amiodarone drip, heparin drip - Continue telemetry  #ESRD #Hypokalemia #Hyponatremia Patient doing well with CRRT.  -Continue CRRT per nephrology  #Hyperbilirubinemia Bilirubin up to 20.  This likely secondary to congestive hepatopathy.  Patient does have some scleral icterus. -Continue to monitor - Get RUQ ultrasound  #Goals of care DNR. Palliative consulted  Best Practice (right click and "Reselect all SmartList Selections" daily)   Diet/type: tubefeeds DVT prophylaxis: systemic heparin GI prophylaxis:  PPI Lines: Central line Foley:  Yes, and it is still needed Code Status:  full code  Critical care time:    The patient is critically ill with multiple organ system failure and requires high complexity decision making for assessment and support, frequent evaluation and titration of therapies, advanced monitoring, review of radiographic studies and interpretation of complex data.   Critical Care Time devoted to patient care services, exclusive of separately billable procedures, described in this note is 35 minutes.   Chilton Greathouse MD [facility] See Amion for pager  If no response to pager , please call [phone] until 7pm After 7:00 pm call Elink  [phone] 04/06/2023, 8:27 AM

## 2023-04-07 ENCOUNTER — Inpatient Hospital Stay (HOSPITAL_COMMUNITY): Payer: Medicare Other

## 2023-04-07 ENCOUNTER — Encounter (HOSPITAL_COMMUNITY): Payer: Self-pay | Admitting: Internal Medicine

## 2023-04-07 DIAGNOSIS — Z7189 Other specified counseling: Secondary | ICD-10-CM | POA: Diagnosis not present

## 2023-04-07 DIAGNOSIS — R579 Shock, unspecified: Secondary | ICD-10-CM | POA: Diagnosis not present

## 2023-04-07 DIAGNOSIS — Z515 Encounter for palliative care: Secondary | ICD-10-CM | POA: Diagnosis not present

## 2023-04-07 LAB — RENAL FUNCTION PANEL
Albumin: 2.5 g/dL — ABNORMAL LOW (ref 3.5–5.0)
Anion gap: 15 (ref 5–15)
BUN: 26 mg/dL — ABNORMAL HIGH (ref 6–20)
CO2: 21 mmol/L — ABNORMAL LOW (ref 22–32)
Calcium: 8.2 mg/dL — ABNORMAL LOW (ref 8.9–10.3)
Chloride: 98 mmol/L (ref 98–111)
Creatinine, Ser: 1.47 mg/dL — ABNORMAL HIGH (ref 0.61–1.24)
GFR, Estimated: 56 mL/min — ABNORMAL LOW (ref >60)
Glucose, Bld: 119 mg/dL — ABNORMAL HIGH (ref 70–99)
Phosphorus: 2.9 mg/dL (ref 2.5–4.6)
Potassium: 4.4 mmol/L (ref 3.5–5.1)
Sodium: 134 mmol/L — ABNORMAL LOW (ref 135–145)

## 2023-04-07 LAB — COMPREHENSIVE METABOLIC PANEL
ALT: 96 U/L — ABNORMAL HIGH (ref 0–44)
AST: 61 U/L — ABNORMAL HIGH (ref 15–41)
Albumin: 2.6 g/dL — ABNORMAL LOW (ref 3.5–5.0)
Alkaline Phosphatase: 128 U/L — ABNORMAL HIGH (ref 38–126)
Anion gap: 15 (ref 5–15)
BUN: 23 mg/dL — ABNORMAL HIGH (ref 6–20)
CO2: 21 mmol/L — ABNORMAL LOW (ref 22–32)
Calcium: 8.5 mg/dL — ABNORMAL LOW (ref 8.9–10.3)
Chloride: 98 mmol/L (ref 98–111)
Creatinine, Ser: 1.51 mg/dL — ABNORMAL HIGH (ref 0.61–1.24)
GFR, Estimated: 54 mL/min — ABNORMAL LOW (ref >60)
Glucose, Bld: 101 mg/dL — ABNORMAL HIGH (ref 70–99)
Potassium: 3.9 mmol/L (ref 3.5–5.1)
Sodium: 134 mmol/L — ABNORMAL LOW (ref 135–145)
Total Bilirubin: 19.3 mg/dL (ref 0.3–1.2)
Total Protein: 7.6 g/dL (ref 6.5–8.1)

## 2023-04-07 LAB — CBC
HCT: 26.1 % — ABNORMAL LOW (ref 39.0–52.0)
Hemoglobin: 9.5 g/dL — ABNORMAL LOW (ref 13.0–17.0)
MCH: 29.5 pg (ref 26.0–34.0)
MCHC: 36.4 g/dL — ABNORMAL HIGH (ref 30.0–36.0)
MCV: 81.1 fL (ref 80.0–100.0)
Platelets: 142 10*3/uL — ABNORMAL LOW (ref 150–400)
RBC: 3.22 MIL/uL — ABNORMAL LOW (ref 4.22–5.81)
RDW: 19.7 % — ABNORMAL HIGH (ref 11.5–15.5)
WBC: 25 10*3/uL — ABNORMAL HIGH (ref 4.0–10.5)
nRBC: 6.1 % — ABNORMAL HIGH (ref 0.0–0.2)

## 2023-04-07 LAB — APTT: aPTT: 93 s — ABNORMAL HIGH (ref 24–36)

## 2023-04-07 LAB — GLUCOSE, CAPILLARY
Glucose-Capillary: 94 mg/dL (ref 70–99)
Glucose-Capillary: 100 mg/dL — ABNORMAL HIGH (ref 70–99)
Glucose-Capillary: 120 mg/dL — ABNORMAL HIGH (ref 70–99)
Glucose-Capillary: 98 mg/dL (ref 70–99)
Glucose-Capillary: 106 mg/dL — ABNORMAL HIGH (ref 70–99)
Glucose-Capillary: 134 mg/dL — ABNORMAL HIGH (ref 70–99)

## 2023-04-07 LAB — COOXEMETRY PANEL
Carboxyhemoglobin: 2 % — ABNORMAL HIGH (ref 0.5–1.5)
Methemoglobin: 0.7 % (ref 0.0–1.5)
O2 Saturation: 64.4 %
Total hemoglobin: 9.7 g/dL — ABNORMAL LOW (ref 12.0–16.0)

## 2023-04-07 LAB — MAGNESIUM: Magnesium: 2.6 mg/dL — ABNORMAL HIGH (ref 1.7–2.4)

## 2023-04-07 LAB — PHOSPHORUS: Phosphorus: 2.8 mg/dL (ref 2.5–4.6)

## 2023-04-07 MED ORDER — SENNOSIDES-DOCUSATE SODIUM 8.6-50 MG PO TABS
1.0000 | ORAL_TABLET | Freq: Every day | ORAL | Status: DC
  Administered 2023-04-07 – 2023-04-09 (×3): 1
  Filled 2023-04-07 (×3): qty 1

## 2023-04-07 MED ORDER — ALBUMIN HUMAN 25 % IV SOLN
25.0000 g | Freq: Four times a day (QID) | INTRAVENOUS | Status: DC
  Administered 2023-04-07 – 2023-04-08 (×3): 25 g via INTRAVENOUS
  Filled 2023-04-07 (×3): qty 100

## 2023-04-07 MED ORDER — BISACODYL 10 MG RE SUPP
10.0000 mg | Freq: Once | RECTAL | Status: AC
  Administered 2023-04-07: 10 mg via RECTAL
  Filled 2023-04-07: qty 1

## 2023-04-07 MED ORDER — POLYETHYLENE GLYCOL 3350 17 G PO PACK
17.0000 g | PACK | Freq: Every day | ORAL | Status: DC
  Administered 2023-04-07 – 2023-04-09 (×3): 17 g
  Filled 2023-04-07 (×4): qty 1

## 2023-04-07 NOTE — Progress Notes (Signed)
ANTICOAGULATION CONSULT NOTE  Pharmacy Consult for Heparin Indication: ACS vs VTE  No Known Allergies  Patient Measurements: Height: 5\' 11"  (180.3 cm) Weight: 101.9 kg (224 lb 10.4 oz) IBW/kg (Calculated) : 75.3 Heparin Dosing Weight: 100 kg  Vital Signs: Temp: 97.7 F (36.5 C) (09/30 0332) Temp Source: Axillary (09/30 0332) Pulse Rate: 73 (09/30 0700)  Labs: Recent Labs    04/04/23 1011 04/04/23 1139 04/05/23 0312 04/05/23 0955 04/05/23 1537 04/06/23 0310 04/06/23 1257 04/06/23 1546 04/07/23 0334  HGB  --    < > 10.7*  --   --  9.6*  --   --  9.5*  HCT  --    < > 29.8*  --   --  27.2*  --   --  26.1*  PLT  --   --  161  --   --  149*  --   --  142*  APTT 81*   < >  --  97*   < > 115* 67*  --  93*  HEPARINUNFRC 0.17*  --  0.23* 0.20*  --   --   --   --   --   CREATININE  --    < > 1.79*  --    < > 0.83  --  1.11 1.51*   < > = values in this interval not displayed.    Estimated Creatinine Clearance: 67.2 mL/min (A) (by C-G formula based on SCr of 1.51 mg/dL (H)).   Assessment: 3 YOM presenting with shock. Significant cardiac history including MSSA endocarditis, mitral regurgitation, aortic stenosis. Also hx of ESRD and DVT. Presented with elevated troponins and ECHO showed moderate RV dysfunction and RV enlargement. Pharmacy consulted to dose heparin to rule out PE with potential ACS. No AC PTA.  aPTT therapeutic at 93 sec on 2000 units/hr. Utilizing aPTT for monitoring as elevated tbili effects heparin levels. CBC stable, no signs of bleeding per RN.   Goal of Therapy:  aPTT 66-102 sec Monitor platelets by anticoagulation protocol: Yes   Plan:  Continue heparin infusion at 2000 units/hr  Will only monitor aPTT in this pt with elevated tbili.  Daily aPTT, CBC Monitor for s/sx of bleeding  Thank you for involving pharmacy in the patient's care.   Theotis Burrow, PharmD PGY1 Acute Care Pharmacy Resident  04/07/2023 7:44 AM

## 2023-04-07 NOTE — Procedures (Signed)
Cortrak  Person Inserting Tube:  Mahala Menghini, RD Tube Type:  Cortrak - 43 inches Tube Size:  10 Tube Location:  Left nare Secured by: Bridle Technique Used to Measure Tube Placement:  Marking at nare/corner of mouth Cortrak Secured At:  69 cm   Cortrak Tube Team Note:  Consult received to place a Cortrak feeding tube.   X-ray is required, abdominal x-ray has been ordered by the Cortrak team. Please confirm tube placement before using the Cortrak tube.   If the tube becomes dislodged please keep the tube and contact the Cortrak team at [email or web address] for replacement.  If after hours and replacement cannot be delayed, place a NG tube and confirm placement with an abdominal x-ray.    Mertie Clause, MS, RD, LDN Registered Dietitian II Please see AMiON for contact information.

## 2023-04-07 NOTE — Progress Notes (Signed)
eLink Physician-Brief Progress Note Patient Name: Timothy Palmer DOB: 1966/01/03 MRN: [id number]   Date of Service  04/07/2023  HPI/Events of Note  56 year old male patient with history of ESRD ( T, Th, Sat) , Systolic and diastolic CHF, HTN, Mitral valve repair ( 2015) and replacement 2/2 MSSA endocarditis(2016), Mycotic aneurysm, Upper extremity DVT(2015) and PVD. Has been hospitalized for septic and cardiogenic shock.   Patient's blood pressure has been difficult to control.  Norepinephrine has doubled to 30.  Maintains milrinone.  Has been intermittently intravascularly dry requiring albumin infusion.  Continuous renal replacement therapy previously pulling, now maintaining euvolemia.  eICU Interventions  Schedule albumin 25% for 24 hours.  Maintain milrinone at stable dose, utilize vasopressin and norepinephrine as needed to maintain MAP.     Intervention Category Intermediate Interventions: Hypotension - evaluation and management  Jhayden Demuro 04/07/2023, 9:59 PM

## 2023-04-07 NOTE — Progress Notes (Addendum)
Presents with hx of 4 days of diarrhea,  poor po intake, fatigue with weakness and body aches 9/21.   04/07/23 1417  TOC Brief Assessment  Insurance and Status Reviewed  Patient has primary care physician Yes  Home environment has been reviewed Lived alone. On disability. TTS HD.  Prior level of function: PTA Independent with ADL's. Drives self to HD session.  Prior/Current Home Services No current home services  Social Determinants of Health Reivew SDOH reviewed no interventions necessary  Readmission risk has been reviewed No  Transition of care needs no transition of care needs at this time   Awake and oriented, NCM @ bedside to f/u with transitional care needs. Pt on HHFNC, gtt ( amio, heparin,milrinone, levo, vasopressin, IV abx, cortrak/TF, CRRT). Palliative following... Sister Timothy Mccreedy, [place]. Permission given by pt to speak to sister if needed. Timothy Palmer (Sister) Emergency Contact [phone]    [facility] team following and will assist with needs as presents.

## 2023-04-07 NOTE — Progress Notes (Signed)
Cortrak ok to use per verbal Dr. Allena Katz

## 2023-04-07 NOTE — Progress Notes (Signed)
Timothy Palmer NEPHROLOGY PROGRESS NOTE    Subjective: Seen and examined in ICU.  Seen by palliative care team.  Patient is DNR.  On high flow oxygen.  Still on milrinone, Levophed, vasopressin. We were net neg 1.6 L yesterday. Wts down at 101.9kg    Objective Vital signs in last 24 hours: Vitals:   04/07/23 1030 04/07/23 1045 04/07/23 1100 04/07/23 1115  BP:      Pulse: 69 75 67 66  Resp: (!) 41 (!) 33 (!) 23 (!) 31  Temp:      TempSrc:      SpO2: 100% 100% 99% 100%  Weight:      Height:        Physical Exam: General: Ill looking male, extubated, quite agitated. Heart:RRR, s1s2 nl Lungs: Coarse breath sound bilateral. Abdomen:soft, Non-tender, non-distended Extremities: Trace peripheral edema Dialysis Access: L AVF.  RIJ temp HD cath placed on 9/21.    [facility] HD: [place] [facility] (on hd since 2005)  4.5hr  450/1.5    119kg  2/2 bath  AVF LUA   Heparin 5000 - last OP HD 9/19, post wt 118.4kg  - rocaltrol 0.75 mcg po tts - sensipar 150 mg po tts - no esa (last Hb 11.6 on 9/19)   Assessment/ Plan:  # Combined septic and  cardiogenic shock - on IV abx and pressor support per PCCM.  # Severe AS / RV dysfunction/V tach: Status post TEE on 9/23 with no vegetation however so severe AAS, severe RV systolic dysfunction with elevated pulmonary pressure.  RHC on 9/27 with severe pulmonary artery hypertension despite milrinone, severe and end-stage PAH/cor pulmonale, recommended medical treatment and palliative consult.  # ESRD - HD TTS.  Currently on CRRT.  Still requiring high pressor support to maintain BP.  Do not think he can tolerate intermittent HD and still looks volume up. We will continue UF with CRRT for now.  Given end-stage cardiac disease and multiple comorbidities, the prognosis is poor.  Palliative care is already on board.  # Volume - LE edema is mostly gone, some chronic non-pitting edema. Down 17kg from dry wt if wt's are accurate (12kg down from admit wt). CXR's  show poss vasc congestion vs normal CXR. Will d/w CCM.   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  No binders while on CRRT.  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  No vegetation and TEE.  # VDRF: Self extubated.  # Hypophosphatemia: sp Kphos. phos level acceptable today.  Vinson Moselle  MD  [facility] 04/07/2023, 11:43 AM  Recent Labs  Lab 04/06/23 0310 04/06/23 1546 04/07/23 0334  HGB 9.6*  --  9.5*  ALBUMIN 2.7* 2.9* 2.6*  CALCIUM 8.4* 8.4* 8.5*  PHOS 3.4 2.4* 2.8  CREATININE 0.83 1.11 1.51*  K 4.0 4.3 3.9    Inpatient medications:  calcitRIOL  0.75 mcg Per Tube Q T,Th,Sat-1800   Chlorhexidine Gluconate Cloth  6 each Topical Daily   insulin aspart  0-15 Units Subcutaneous Q4H   multivitamin  1 tablet Per Tube QHS   polyethylene glycol  17 g Per Tube Daily   senna-docusate  1 tablet Per Tube Daily   sodium chloride flush  3 mL Intravenous Q12H     prismasol BGK 4/2.5 400 mL/hr at 04/06/23 2306    prismasol BGK 4/2.5 400 mL/hr at 04/06/23 2306   sodium chloride 10 mL/hr at 04/07/23  1100   amiodarone 30 mg/hr (04/07/23 1100)   ceFEPime (MAXIPIME) IV Stopped (04/07/23 0817)   feeding supplement (PIVOT 1.5 CAL) Stopped (04/04/23 0110)   heparin 2,000 Units/hr (04/07/23 1100)   milrinone 0.125 mcg/kg/min (04/07/23 1100)   norepinephrine (LEVOPHED) Adult infusion 8 mcg/min (04/07/23 1100)   prismasol BGK 4/2.5 1,500 mL/hr at 04/07/23 1115   vasopressin Stopped (04/07/23 0807)   sodium chloride, acetaminophen, Gerhardt's butt cream, heparin, lip balm, mouth rinse, prochlorperazine, sodium chloride flush

## 2023-04-07 NOTE — Progress Notes (Signed)
SLP Cancellation Note  Patient Details Name: Timothy Palmer MRN: [id number] DOB: [birth year]   Cancelled treatment:        Pt now declines FEES.  He has cortrak in place for enteral access.  We can hold FEES at this time.  Unfortunately, he cannot transport to radiology for MBSS.  SLP will follow for readiness for FEES.   Kerrie Pleasure, MA, CCC-SLP Acute Rehabilitation Services Office: [phone] 04/07/2023, 11:18 AM

## 2023-04-07 NOTE — Evaluation (Addendum)
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 The aneurysm measures 1.4 x 1.1 cm which is  significantly larger than the 4.5 mm parent vessel.    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   Past Surgical History:  Past Surgical History:  Procedure Laterality Date   AV FISTULA PLACEMENT Left ?2005   forearm    BASCILIC VEIN TRANSPOSITION Left 12/27/2015   Procedure: FIRST STAGE BASILIC VEIN TRANSPOSITION;  Surgeon: Fransisco Hertz, MD;  Location: [facility] OR;  Service: Vascular;  Laterality: Left;   BASCILIC VEIN TRANSPOSITION Left 02/12/2016   Procedure: SECOND STAGE BASILIC VEIN TRANSPOSITION;  Surgeon: Fransisco Hertz, MD;  Location: [facility] OR;  Service: Vascular;  Laterality: Left;   CENTRAL LINE INSERTION  04/01/2023   Procedure: CENTRAL LINE INSERTION;  Surgeon: Dolores Patty, MD;  Location: [facility] INVASIVE CV LAB;  Service: Cardiovascular;;   EMBOLECTOMY Left 03/25/2014   Procedure: EMBOLECTOMY left popliteal;  Surgeon: Larina Earthly, MD;  Location: [facility] OR;  Service: Vascular;  Laterality: Left;   FEMORAL-POPLITEAL BYPASS GRAFT Left 03/25/2014   Procedure: Left Femoral- Below Knee Popliteal Bypass Graft;  Surgeon: Larina Earthly, MD;  Location: [facility] OR;  Service: Vascular;  Laterality: Left;   INTRAOPERATIVE TRANSESOPHAGEAL ECHOCARDIOGRAM N/A 04/05/2014   Procedure: INTRAOPERATIVE TRANSESOPHAGEAL ECHOCARDIOGRAM;  Surgeon: Purcell Nails, MD;  Location: [facility] OR;  Service: Open Heart Surgery;  Laterality: N/A;   LEFT AND RIGHT HEART CATHETERIZATION WITH CORONARY ANGIOGRAM N/A 03/28/2014   Procedure: LEFT AND RIGHT HEART CATHETERIZATION WITH CORONARY ANGIOGRAM;  Surgeon: Laurey Morale, MD;  Location: [facility] CATH LAB;  Service: Cardiovascular;  Laterality: N/A;   MITRAL VALVE REPAIR Right 04/05/2014   Procedure: MINIMALLY INVASIVE MITRAL VALVE REPAIR (MVR);  Surgeon: Purcell Nails, MD;  Location: [facility] OR;  Service: Open Heart Surgery;  Laterality: Right;   MITRAL VALVE REPLACEMENT Right 04/05/2014   Procedure: Bring back MINIMALLY INVASIVE MITRAL VALVE (MV) REPLACEMENT - Reexploration for bleeding;  Surgeon: Purcell Nails, MD;  Location: [facility] OR;  Service: Open Heart  Surgery;  Laterality: Right;   PARATHYROIDECTOMY N/A 03/22/2016   Procedure: PARATHYROIDECTOMY AUTOTRANSPLANT;  Surgeon: Darnell Level, MD;  Location: [facility] OR;  Service: General;  Laterality: N/A;   PATCH ANGIOPLASTY Left 07/23/2013   Procedure: PATCH ANGIOPLASTY- LEFT RADIOCEPHALIC  ARTERIOVENOUS FISTULA;  Surgeon: Chuck Hint, MD;  Location: [facility] OR;  Service: Vascular;  Laterality: Left;   REVISON OF ARTERIOVENOUS FISTULA Left 11/04/2019   Procedure: REVISON OF LEFT ARM ARTERIOVENOUS FISTULA;  Surgeon: Larina Earthly, MD;  Location: [facility] OR;  Service: Vascular;  Laterality: Left;   RIGHT HEART CATH N/A 04/01/2023   Procedure: RIGHT HEART CATH;  Surgeon: Dolores Patty, MD;  Location: [facility]VAS[facility] CV LAB;  Service: Cardiovascular;  Laterality: N/A;   RIGHT HEART CATH N/A 04/04/2023   Procedure: RIGHT HEART CATH;  Surgeon: Dolores Patty, MD;  Location: [facility] LAB;  Service: Cardiovascular;  Laterality: N/A;   SHUNTOGRAM Left November 04, 2011   TEE WITHOUT CARDIOVERSION N/A 03/24/2014   Procedure: TRANSESOPHAGEAL ECHOCARDIOGRAM (TEE);  Surgeon: Laurey Morale, MD;  Location: [facility] ENDOSCOPY;  Service: Cardiovascular;  Laterality: N/A;   TEE WITHOUT CARDIOVERSION N/A 04/12/2015   Procedure: TRANSESOPHAGEAL ECHOCARDIOGRAM (TEE);  Surgeon: Laurey Morale, MD;  Location: [facility] ENDOSCOPY;  Service: Cardiovascular;  Laterality: N/A;   TEE WITHOUT CARDIOVERSION N/A 08/18/2015   Procedure: TRANSESOPHAGEAL ECHOCARDIOGRAM (TEE);  Surgeon: Laurey Morale, MD;  Location: [facility] ENDOSCOPY;  Service: Cardiovascular;  Laterality: N/A;   HPI:  Timothy Palmer is a 57 year old male who has been hospitalized for the past seven days and is being treated for septic and cardiogenic shock. Pt required ETT 9/21-9/27 with self extubation.  Presently on CRRT and requiring HHFNC. CXR 9/29 with "bilateral upper lobe perihilar opacities which may represent infiltrate or asymmetric edema. No focal airspace opacities." Pt with history of ESRD ( T, Th, Sat) , Systolic and diastolic CHF, HTN, Mitral valve repair ( 2015) and replacement 2/2 MSSA endocarditis(2016), Mycotic aneurysm, Upper extremity DVT(2015) and PVD.    Assessment / Plan / Recommendation  Clinical Impression  Pt presents witch clinical  indicators of pharyngeal dysphagia in setting of  VDRF with 6 day intubation and self-extubation.  Pt with very weak, hoarse, dysphonic vocal quality, but able to acheive phonation with effort. SLP provided oral care prior to administration of PO trials. Pt exhibited intermittent throat clearing with thin liquid by spoon.  Pt tolerated puree and acheived adequate oral clearance.  Further PO trials deferred.  Pt will need instrumental assessment prior to intiation of PO diet.  Pt is on HHFNC and CRRT and cannot transport to radiology for MBSS.  Pt is agreeable to FEES at bedside. Spoke with RN and physician. Pt has not yet had BM and abdomen is distented.  Will hold FEES at present with team to address constipation.  Will plan for FEES once pt has had BM.    Recommend pt remain NPO pending instrumental assessment.   SLP Visit Diagnosis: Dysphagia, unspecified (R13.10)    Aspiration Risk  Moderate aspiration risk    Diet Recommendation NPO    Medication Administration: Via alternative means    Other  Recommendations Oral Care Recommendations: Oral care QID    Recommendations for follow up therapy are one component of a multi-disciplinary discharge planning process, led by the attending physician.  Recommendations may be updated based on patient status, additional functional criteria and insurance authorization.  Follow up Recommendations  (TBD)      Assistance Recommended  at Discharge  N/A  Functional Status Assessment  (TBD)  Frequency and Duration  (TBD)          Prognosis Prognosis for improved oropharyngeal function:  (TBD)      Swallow Study   General Date of Onset: 03/29/23 HPI: Fredie Ranard is a 57 year old male who has been hospitalized for the past seven days and is being treated for septic and cardiogenic shock. Pt required ETT 9/21-9/27 with self extubation.  Presently on CRRT and requiring HHFNC. CXR 9/29 with "bilateral upper lobe perihilar opacities which may represent  infiltrate or asymmetric edema. No focal airspace opacities." Pt with history of ESRD ( T, Th, Sat) , Systolic and diastolic CHF, HTN, Mitral valve repair ( 2015) and replacement 2/2 MSSA endocarditis(2016), Mycotic aneurysm, Upper extremity DVT(2015) and PVD. Type of Study: Bedside Swallow Evaluation Previous Swallow Assessment: None Diet Prior to this Study: NPO Temperature Spikes Noted: No Respiratory Status: Nasal cannula (HHFNC) History of Recent Intubation: Yes Total duration of intubation (days): 6 days Date extubated: 04/04/23 (self extubated) Behavior/Cognition: Alert;Cooperative Oral Cavity Assessment: Within Functional Limits Oral Care Completed by SLP: Yes Oral Cavity - Dentition: Adequate natural dentition Patient Positioning: Upright in bed Baseline Vocal Quality: Breathy;Low vocal intensity;Aphonic Volitional Cough: Weak Volitional Swallow: Able to elicit    Oral/Motor/Sensory Function Overall Oral Motor/Sensory Function: Within functional limits Facial ROM: Reduced right;Reduced left Facial Symmetry: Within Functional Limits Lingual ROM: Within Functional Limits Lingual Symmetry: Within Functional Limits Lingual Strength: Reduced Velum: Within Functional Limits Mandible: Within Functional Limits   Ice Chips Ice chips: Not tested   Thin Liquid Thin Liquid: Impaired Pharyngeal  Phase Impairments: Throat Clearing - Delayed;Multiple swallows    Nectar Thick Nectar Thick Liquid: Not tested   Honey Thick Honey Thick Liquid: Not tested   Puree Puree: Within functional limits Presentation: Spoon   Solid     Solid: Not tested      Kerrie Pleasure, MA, CCC-SLP Acute Rehabilitation Services Office: [phone] 04/07/2023,9:55 AM

## 2023-04-07 NOTE — Progress Notes (Signed)
Nutrition Follow-up  DOCUMENTATION CODES:  Obesity unspecified  INTERVENTION:  Restart TF via cortrak tube after pt has BM: Pivot 1.5 at 65 ml/h (1560 ml per day) Start at 25 and advance by 10 q8h to goal Provides 2340 kcal, 146 gm protein, 1170 ml free water daily Renal multivitamin daily Monitor BM status, additional medication added today  NUTRITION DIAGNOSIS:  Inadequate oral intake related to inability to eat as evidenced by NPO status. - remains applicable   GOAL:  Patient will meet greater than or equal to 90% of their needs - not progressing, TF on hold, no BM x 9 days  MONITOR:  Diet advancement, Labs, Weight trends, I & O's, TF tolerance  REASON FOR ASSESSMENT:  Consult Assessment of nutrition requirement/status  ASSESSMENT:  57 year old male with PMH of ESRD ( T, Th, Sat) , Systolic and diastolic CHF, HTN, Mycotic aneurysm, and PVD. Presented 9/21 with history of 4 days of diarrhea, poor po intake and fatigue with weakness and body aches. Admitted for shock with acute respiratory failure.   9/21 - admitted, intubated, CRRT initiated 9/23 - TF initiated, TEE showed severe AS, severe  RV systolic dysfunction, and severely elevated pulmonary pressures 9/24 - right heart cath showing severe right heart failure  9/26 - pt vomited, TF held 9/27 - pt self-extubated 9/30 - cortrak placed, terminates mid-stomach  Pt resting in bed at the time of assessment, just had cortrak placed. Discussed pt with RN and in rounds. Pt still with no BM. Suppository given this AM and plan to do enema later today if no BM. TF still on hold as pt lost access after self-extubation. Cortrak placement confirmed via XR, but TF not to be restarted until pt has BM. If unable to resume feeds in the next 48 hours, would consider TPN although this is not ideal as pt is fluid restricted.   CRRT continues to run at this time. PT made DNR after extubation.    Intake/Output Summary (Last 24 hours) at  04/07/2023 1344 Last data filed at 04/07/2023 1300 Gross per 24 hour  Intake 1828.24 ml  Output 4151 ml  Net -2322.76 ml  Net IO Since Admission: -5,218.54 mL [04/07/23 1344]  Admit weight: 114.1 kg  Current weight: 101.9 kg  Nutritionally Relevant Medications: Scheduled Meds:  calcitRIOL  0.75 mcg Per Tube Q T,Th,Sat-1800   insulin aspart  0-15 Units Subcutaneous Q4H   multivitamin  1 tablet Per Tube QHS   polyethylene glycol  17 g Per Tube Daily   senna-docusate  1 tablet Per Tube Daily   Continuous Infusions:  ceFEPime (MAXIPIME) IV Stopped (04/07/23 0817)   feeding supplement (PIVOT 1.5 CAL) Stopped (04/04/23 0110)   norepinephrine (LEVOPHED) Adult infusion 11 mcg/min (04/07/23 1300)   Labs Reviewed: Na 134 BUN 23, creatinine 1.51 Mg 2.6 CBG ranges from 94-128 mg/dL over the last 24 hours HgbA1c 5.7%  NUTRITION - FOCUSED PHYSICAL EXAM: Flowsheet Row Most Recent Value  Orbital Region No depletion  Upper Arm Region No depletion  Thoracic and Lumbar Region No depletion  Buccal Region Unable to assess  Temple Region No depletion  Clavicle Bone Region No depletion  Clavicle and Acromion Bone Region No depletion  Scapular Bone Region Unable to assess  Dorsal Hand No depletion  Patellar Region No depletion  Anterior Thigh Region No depletion  Posterior Calf Region No depletion  Edema (RD Assessment) Mild  Hair Reviewed  Eyes Unable to assess  Mouth Unable to assess  Skin Reviewed  Nails Reviewed    Diet Order:   Diet Order             Diet NPO time specified  Diet effective now                   EDUCATION NEEDS:  Not appropriate for education at this time  Skin:  Skin Assessment: Reviewed RN Assessment Skin Tear - Scrotum: 6 x 12 cm (skin missing from swelling) - buttocks, left: 1.5 x 2.5 cm  Last BM:  PTA  Height:  Ht Readings from Last 1 Encounters:  03/29/23 5\' 11"  (1.803 m)    Weight:  Wt Readings from Last 1 Encounters:  04/07/23  101.9 kg    Ideal Body Weight:  78.18 kg  BMI:  Body mass index is 31.33 kg/m.  Estimated Nutritional Needs:  Kcal:  2300-2600 kcal/d Protein:  130-150 g/d Fluid:  2.3L/d    Greig Castilla, RD, LDN Clinical Dietitian RD pager # available in [facility]  After hours/weekend pager # available in [facility]

## 2023-04-07 NOTE — Progress Notes (Signed)
   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 Has been hospitalized for the past seven days and is being treated for septic and cardiogenic shock. The PMT has been consulted to assist with additional goals of care conversations.   Today's Discussion 04/07/2023  *Please note that this is a verbal dictation therefore any spelling or grammatical errors are due to the "[employer] One" system interpretation.  Chart reviewed inclusive of vital signs, progress notes, laboratory results, and diagnostic images.   I spoke with Fayrene Fearing' RN, Kim who shares that Pal is more alert and oriented today. We discussed his present improvements and how he is now on one pressor after having been weaned off of a second one.   I met with Fayrene Fearing at bedside. Created space and opportunity for patient to explore thoughts feelings and fears regarding current medical situation. His greatest worry at this time is his children. Allowed him time to express this. Reaffirmed code status as determined with patients family. Patient is in agreement with this.   Plan to continue allowing time for outcomes.   Questions and concerns addressed/Palliative Support Provided.   Objective Assessment: Vital Signs Vitals:   04/07/23 1100 04/07/23 1115  BP:    Pulse: 67 66  Resp: (!) 23 (!) 31  Temp:    SpO2: 99% 100%    Intake/Output Summary (Last 24 hours) at 04/07/2023 1135 Last data filed at 04/07/2023 1100 Gross per 24 hour  Intake 1808.65 ml  Output 4192 ml  Net -2383.35 ml   Last Weight  Most recent update: 04/07/2023  7:04 AM    Weight  101.9 kg (224 lb 10.4 oz)            Gen:  Middle aged AA M critically ill appearing HEENT: Dry mucous membranes CV: Regular rate and rhythm  PULM: On  40LPM HFNC, breathing is even and nonlabored ABD: slight distention though nontender  EXT: Generalized edema  Neuro: Opens eyes to name, is able to speak  SUMMARY OF RECOMMENDATIONS   DNAR/DNI    Plan to continue current care allowing time for outcomes   Appreciate Chaplain support   Palliative care will continue to follow along    Billing based on MDM: High ______________________________________________________________________________________ [name] [facility] Palliative Medicine Team Team Cell Phone: [phone] Please utilize secure chat with additional questions, if there is no response within 30 minutes please call the above phone number  Palliative Medicine Team providers are available by phone from 7am to 7pm daily and can be reached through the team cell phone.  Should this patient require assistance outside of these hours, please call the patient's attending physician.

## 2023-04-07 NOTE — Plan of Care (Signed)

## 2023-04-07 NOTE — Plan of Care (Signed)
  Problem: Education: Goal: Knowledge of General Education information will improve Description: Including pain rating scale, medication(s)/side effects and non-pharmacologic comfort measures Outcome: Progressing   Problem: Health Behavior/Discharge Planning: Goal: Ability to manage health-related needs will improve Outcome: Progressing   Problem: Clinical Measurements: Goal: Diagnostic test results will improve Outcome: Progressing   

## 2023-04-07 NOTE — Progress Notes (Signed)
NAME:  Timothy Palmer, MRN:  [id number], DOB:  06/04/1966, LOS: 9 ADMISSION DATE:  03/29/2023, CONSULTATION DATE:  03/29/2023 REFERRING MD: EDP, CHIEF COMPLAINT: Shock  History of Present Illness:   58 year old male patient with history of ESRD ( T, Th, Sat) , Systolic and diastolic CHF, HTN, Mitral valve repair ( 2015) and replacement 2/2 MSSA  endocarditis(2016), Mycotic aneurysm, Upper extremity DVT(2015) and PVD. Presented to the Ed 9/21 with history or 4 days of diarrhea which resolved 9/20, with poor po intake and fatigue with weakness and body aches. In the ED he had Lactate of 11.9, Troponin of 900, bedside echo by ED MD showed decreased EF. Last echo 2016 showed EF of 50-55%. He is not followed by cardiology. He is not on blood thinners as they caused his HD graph to bleed. PCCM were notified by ED MD  and asked to admit the patient and manage care .  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   Significant Hospital Events: Including procedures, antibiotic start and stop dates in addition to other pertinent events   03/29/2023 Admission  - Stated he is weak and tired, has no energy.Currently on 2 L [place] with sats of 100%,HD is due  today, 9/21> echo with severe RV enlargement and pulmonary hypertension severe arctic stenosis that is calcified.  Nephrology started CRRT due to shock.  Patient went on pressors.  HD cath placed.  Later intubated.  Also had had wide-complex rhythm agonal respirations but appears did not lose any pulses.  Received cardioversion received stat hyperkalemia treatment.  Status post arterial line insertion 09/24: Heart cath showing severe right heart failure.  Started on inhaled nitric oxide and milrinone 9/28 repeat right heart cath shows some improvement but still has severe pulmonary hypertension.  No more options for advanced heart failure who has signed off.  Urology consulted for scrotal wound. 9/29 Self extubated. Made DNR after discussion with family  Interim History / Subjective:   No acute events overnight  Patient evaluated bedside this morning. Patient is awake and alert and able to follow instructions.  He is agreeable for core track today if needed.  Objective   Blood pressure 139/70, pulse 73, temperature 97.7 F (36.5 C), temperature source Axillary, resp. rate (!) 29, height 5\' 11"  (1.803 m), weight 101.9 kg, SpO2 100%. Blood pressure has been ranging 99/48, 102/42, 125/45, 102/40 currently on milrinone 0.125, nor epi 2 mcg, vasopressin 0.04 satting at 100% on high flow nasal cannula at 40 L a minute afebrile.  CRRT with 4.2 L out FiO2 (%):  [35 %] 35 %   Intake/Output Summary (Last 24 hours) at 04/07/2023 0707 Last data filed at 04/07/2023 0700 Gross per 24 hour  Intake 1952.22 ml  Output 4201 ml  Net -2248.78 ml   Filed Weights   04/05/23 0446 04/06/23 0500 04/07/23 0500  Weight: 105.2 kg 105.2 kg 101.9 kg    Examination: General: Patient is  resting comfortably in bed in no acute distress  Eyes: Pupils equal and reactive to light  Head: Normocephalic, atraumatic  Cardio: Regular rate and rhythm, no murmurs, rubs or gallops Pulmonary: coarse breath sounds throughout   Abdomen: Distended  Neuro: Alert and orientated x2. Able to follow all instructions Skin: No rashes noted   Lab/imaging reviewed Phosphorus 2.8. CMP: Sodium 134, bicarb 21, creatinine 1.51, AST 61, ALT 96, bilirubin 19.3 (21) COOX 64.4 Mag 2.6 CBC: White count 25, hemoglobin 9.5, platelets 142  Right upper quadrant ultrasound showing gallbladder sludge and wall thickening without cholelithiasis. Chest x-ray: Cardiomegaly and pulmonary vascular congestion  Resolved Hospital Problem list     Assessment & Plan:   #Combined shock with cardiogenic and septic #Severe aortic valve stenosis Heart failure signed off.  Continue inotropic and vasopressor agents. As patient self extubated, patient can no longer get the iNO. Heart failure team did not have much more to offer at this time.  Had goals of care conversation with patient and patient's family who are waiting for outcomes.  Palliative care is following.   -Continue cefepime day 10, last day -Continue inotropic support with milrinone -Continue Levophed and vasopressin -MAP goal greater than 65 -Continue serial COOX measurements -Palliative care following  #Acute hypoxemic respiratory failure #Concern for aspiration pneumonia #Leukocytosis Patient did self extubate.  Did have acute hypoxemic respiratory failure likely in the setting of underlying shock.  Concern for aspiration pneumonia as well. -Continue cefepime day 10 of 10 -Continue high flow nasal cannula. Wean down as tolerated  #Ileus Patient not had any stool output.  Keep patient n.p.o -Will give patient ducolox suppository, if does not work can move towards a enema  -Optimize bowel regimen -Place cortrak today   #Scrotal edema and skin sloughing Urology signed off. Will continue to with wound care -Continue with wound care   #History of V. Tach #A flutter Patient is currently on amiodarone drip with patient having some concern for A-fib with RVR with rates into  the 200s.  Will continue heparin and amiodarone at this time. -Patient is rate controlled at this time   #ESRD #Hypokalemia #Hyponatremia Patient is slightly hyponatremic.  Otherwise doing well.  4.2 L pulled off yesterday.  Patient doing well with CRRT.  -Continue CRRT per nephrology  #Hyperbilirubinemia Bilirubin elevated likely secondary to congestive hepatopathy.  Right upper quadrant ultrasound did show some gallbladder sludge, but no stones.  Bilirubin is down trending.    #Goals of care DNR. Palliative consulted  Best Practice (right click and "Reselect all SmartList Selections" daily)   Diet/type: tubefeeds DVT prophylaxis: systemic heparin GI prophylaxis: PPI Lines: Central line Foley:  Yes, and it is still needed Code Status:  full code  Critical care time:    The patient is critically ill with multiple organ system failure and requires high complexity decision making for assessment and support, frequent evaluation and titration of therapies, advanced monitoring, review of radiographic studies and interpretation of complex data.   Critical Care Time devoted to patient care services, exclusive of separately billable procedures, described in this note is 35 minutes.   Modena Slater, DO  Internal Medicine Resident PGY-2 [phone]

## 2023-04-08 DIAGNOSIS — R579 Shock, unspecified: Secondary | ICD-10-CM | POA: Diagnosis not present

## 2023-04-08 DIAGNOSIS — Z515 Encounter for palliative care: Secondary | ICD-10-CM | POA: Diagnosis not present

## 2023-04-08 DIAGNOSIS — Z7189 Other specified counseling: Secondary | ICD-10-CM | POA: Diagnosis not present

## 2023-04-08 LAB — GLUCOSE, CAPILLARY
Glucose-Capillary: 116 mg/dL — ABNORMAL HIGH (ref 70–99)
Glucose-Capillary: 121 mg/dL — ABNORMAL HIGH (ref 70–99)
Glucose-Capillary: 127 mg/dL — ABNORMAL HIGH (ref 70–99)
Glucose-Capillary: 135 mg/dL — ABNORMAL HIGH (ref 70–99)
Glucose-Capillary: 163 mg/dL — ABNORMAL HIGH (ref 70–99)
Glucose-Capillary: 97 mg/dL (ref 70–99)

## 2023-04-08 LAB — CBC WITH DIFFERENTIAL/PLATELET
Abs Immature Granulocytes: 2.78 K/uL — ABNORMAL HIGH (ref 0.00–0.07)
Basophils Absolute: 0.1 K/uL (ref 0.0–0.1)
Basophils Relative: 0 %
Eosinophils Absolute: 0.1 K/uL (ref 0.0–0.5)
Eosinophils Relative: 0 %
HCT: 26.9 % — ABNORMAL LOW (ref 39.0–52.0)
Hemoglobin: 9.6 g/dL — ABNORMAL LOW (ref 13.0–17.0)
Immature Granulocytes: 9 %
Lymphocytes Relative: 4 %
Lymphs Abs: 1.3 K/uL (ref 0.7–4.0)
MCH: 29.1 pg (ref 26.0–34.0)
MCHC: 35.7 g/dL (ref 30.0–36.0)
MCV: 81.5 fL (ref 80.0–100.0)
Monocytes Absolute: 1.1 K/uL — ABNORMAL HIGH (ref 0.1–1.0)
Monocytes Relative: 3 %
Neutro Abs: 26.6 K/uL — ABNORMAL HIGH (ref 1.7–7.7)
Neutrophils Relative %: 84 %
Platelets: 160 K/uL (ref 150–400)
RBC: 3.3 MIL/uL — ABNORMAL LOW (ref 4.22–5.81)
RDW: 21.3 % — ABNORMAL HIGH (ref 11.5–15.5)
Smear Review: DECREASED
WBC: 31.9 K/uL — ABNORMAL HIGH (ref 4.0–10.5)
nRBC: 11.5 % — ABNORMAL HIGH (ref 0.0–0.2)

## 2023-04-08 LAB — RENAL FUNCTION PANEL
Albumin: 2.5 g/dL — ABNORMAL LOW (ref 3.5–5.0)
Albumin: 2.9 g/dL — ABNORMAL LOW (ref 3.5–5.0)
Anion gap: 17 — ABNORMAL HIGH (ref 5–15)
Anion gap: 18 — ABNORMAL HIGH (ref 5–15)
BUN: 22 mg/dL — ABNORMAL HIGH (ref 6–20)
BUN: 22 mg/dL — ABNORMAL HIGH (ref 6–20)
CO2: 23 mmol/L (ref 22–32)
CO2: 19 mmol/L — ABNORMAL LOW (ref 22–32)
Calcium: 8.4 mg/dL — ABNORMAL LOW (ref 8.9–10.3)
Calcium: 8.5 mg/dL — ABNORMAL LOW (ref 8.9–10.3)
Chloride: 95 mmol/L — ABNORMAL LOW (ref 98–111)
Chloride: 96 mmol/L — ABNORMAL LOW (ref 98–111)
Creatinine, Ser: 1.45 mg/dL — ABNORMAL HIGH (ref 0.61–1.24)
Creatinine, Ser: 1.59 mg/dL — ABNORMAL HIGH (ref 0.61–1.24)
GFR, Estimated: 57 mL/min — ABNORMAL LOW (ref >60)
GFR, Estimated: 51 mL/min — ABNORMAL LOW (ref >60)
Glucose, Bld: 123 mg/dL — ABNORMAL HIGH (ref 70–99)
Glucose, Bld: 134 mg/dL — ABNORMAL HIGH (ref 70–99)
Phosphorus: 2.6 mg/dL (ref 2.5–4.6)
Phosphorus: 2.9 mg/dL (ref 2.5–4.6)
Potassium: 4.3 mmol/L (ref 3.5–5.1)
Potassium: 4.4 mmol/L (ref 3.5–5.1)
Sodium: 135 mmol/L (ref 135–145)
Sodium: 133 mmol/L — ABNORMAL LOW (ref 135–145)

## 2023-04-08 LAB — HEPATIC FUNCTION PANEL
ALT: 82 U/L — ABNORMAL HIGH (ref 0–44)
AST: 58 U/L — ABNORMAL HIGH (ref 15–41)
Albumin: 2.6 g/dL — ABNORMAL LOW (ref 3.5–5.0)
Alkaline Phosphatase: 137 U/L — ABNORMAL HIGH (ref 38–126)
Bilirubin, Direct: 12.4 mg/dL — ABNORMAL HIGH (ref 0.0–0.2)
Indirect Bilirubin: 7.1 mg/dL — ABNORMAL HIGH (ref 0.3–0.9)
Total Bilirubin: 19.5 mg/dL (ref 0.3–1.2)
Total Protein: 7.6 g/dL (ref 6.5–8.1)

## 2023-04-08 LAB — APTT: aPTT: 83 s — ABNORMAL HIGH (ref 24–36)

## 2023-04-08 LAB — MAGNESIUM: Magnesium: 2.6 mg/dL — ABNORMAL HIGH (ref 1.7–2.4)

## 2023-04-08 LAB — CBC
HCT: 26.2 % — ABNORMAL LOW (ref 39.0–52.0)
Hemoglobin: 9.5 g/dL — ABNORMAL LOW (ref 13.0–17.0)
MCH: 28.5 pg (ref 26.0–34.0)
MCHC: 36.3 g/dL — ABNORMAL HIGH (ref 30.0–36.0)
MCV: 78.7 fL — ABNORMAL LOW (ref 80.0–100.0)
Platelets: 173 10*3/uL (ref 150–400)
RBC: 3.33 MIL/uL — ABNORMAL LOW (ref 4.22–5.81)
RDW: 21 % — ABNORMAL HIGH (ref 11.5–15.5)
WBC: 32.2 10*3/uL — ABNORMAL HIGH (ref 4.0–10.5)
nRBC: 10.9 % — ABNORMAL HIGH (ref 0.0–0.2)

## 2023-04-08 LAB — COOXEMETRY PANEL
Carboxyhemoglobin: 1.7 % — ABNORMAL HIGH (ref 0.5–1.5)
Methemoglobin: <0.7 % (ref 0.0–1.5)
O2 Saturation: 66 %
Total hemoglobin: 9.8 g/dL — ABNORMAL LOW (ref 12.0–16.0)

## 2023-04-08 LAB — TECHNOLOGIST SMEAR REVIEW: Plt Morphology: DECREASED

## 2023-04-08 LAB — LIPOPROTEIN A (LPA): Lipoprotein (a): <8.4 nmol/L (ref <75.0)

## 2023-04-08 MED ORDER — PIPERACILLIN-TAZOBACTAM 3.375 G IVPB 30 MIN
3.3750 g | Freq: Four times a day (QID) | INTRAVENOUS | Status: DC
  Administered 2023-04-08 – 2023-04-09 (×4): 3.375 g via INTRAVENOUS
  Filled 2023-04-08 (×6): qty 50

## 2023-04-08 MED ORDER — PIVOT 1.5 CAL PO LIQD
1000.0000 mL | ORAL | Status: DC
  Administered 2023-04-08: 1000 mL
  Filled 2023-04-08 (×2): qty 1000

## 2023-04-08 MED ORDER — VANCOMYCIN HCL 2000 MG/400ML IV SOLN
2000.0000 mg | Freq: Once | INTRAVENOUS | Status: AC
  Administered 2023-04-08: 2000 mg via INTRAVENOUS
  Filled 2023-04-08: qty 400

## 2023-04-08 MED ORDER — PIPERACILLIN-TAZOBACTAM 3.375 G IVPB 30 MIN
3.3750 g | Freq: Four times a day (QID) | INTRAVENOUS | Status: DC
  Filled 2023-04-08 (×2): qty 50

## 2023-04-08 MED ORDER — VANCOMYCIN HCL 1500 MG/300ML IV SOLN
1500.0000 mg | INTRAVENOUS | Status: DC
  Administered 2023-04-09: 1500 mg via INTRAVENOUS
  Filled 2023-04-08: qty 300

## 2023-04-08 MED ORDER — CALCITRIOL 1 MCG/ML PO SOLN
0.7500 ug | ORAL | Status: DC
  Administered 2023-04-08: 0.75 ug
  Filled 2023-04-08 (×2): qty 0.75

## 2023-04-08 NOTE — Progress Notes (Signed)
Pharmacy Antibiotic Note  Timothy Palmer is a 57 y.o. male admitted on 03/29/2023 with sepsis.  Pharmacy has been consulted for vancomycin and zosyn dosing. Patient admitted on 9/21 and started on cefepime at admission. Continued on cefepime for a total course of 10 days (last dose 9/30 @ 20:00). Patient with increased WBC overnight, no fevers but could be masked from CRRT. Planning to obtain bcx to r/o central line infection. Currently on CRRT.   Plan: Zosyn 3.375mg  Q6H.  Vancomycin 2000mg  x1 followed by 1500mg  q24h.  Follow culture data for de-escalation.  Monitor renal function for dose adjustments as indicated.   Height: 5\' 11"  (180.3 cm) Weight: 104.9 kg (231 lb 4.2 oz) IBW/kg (Calculated) : 75.3  Temp (24hrs), Avg:98.3 F (36.8 C), Min:97.4 F (36.3 C), Max:98.8 F (37.1 C)  Recent Labs  Lab 04/04/23 0210 04/04/23 1527 04/05/23 0312 04/05/23 1537 04/06/23 0310 04/06/23 1546 04/07/23 0334 04/07/23 1553 04/08/23 0315  WBC 24.9*  --  26.3*  --  25.5*  --  25.0*  --  31.9*  32.2*  CREATININE 1.24   < > 1.79*   < > 0.83 1.11 1.51* 1.47* 1.45*   < > = values in this interval not displayed.    Estimated Creatinine Clearance: 70.9 mL/min (A) (by C-G formula based on SCr of 1.45 mg/dL (H)).    No Known Allergies  Thank you for allowing pharmacy to be a part of this patient's care.  Estill Batten, PharmD, BCCCP  04/08/2023 10:44 AM

## 2023-04-08 NOTE — Progress Notes (Signed)
ANTICOAGULATION CONSULT NOTE  Pharmacy Consult for Heparin Indication: ACS vs VTE, intermittent Afib episode 9/29 (hx of pAF noted in chart not on anticoag)    No Known Allergies  Patient Measurements: Height: 5\' 11"  (180.3 cm) Weight: 104.9 kg (231 lb 4.2 oz) IBW/kg (Calculated) : 75.3 Heparin Dosing Weight: 100 kg  Vital Signs: Temp: 98.6 F (37 C) (10/01 0756) Temp Source: Axillary (10/01 0756) Pulse Rate: 78 (10/01 0700)  Labs: Recent Labs    04/05/23 0955 04/05/23 1537 04/06/23 0310 04/06/23 1257 04/06/23 1546 04/07/23 0334 04/07/23 1553 04/08/23 0315  HGB  --    < > 9.6*  --   --  9.5*  --  9.5*  HCT  --   --  27.2*  --   --  26.1*  --  26.2*  PLT  --   --  149*  --   --  142*  --  173  APTT 97*   < > 115* 67*  --  93*  --  83*  HEPARINUNFRC 0.20*  --   --   --   --   --   --   --   CREATININE  --    < > 0.83  --    < > 1.51* 1.47* 1.45*   < > = values in this interval not displayed.    Estimated Creatinine Clearance: 70.9 mL/min (A) (by C-G formula based on SCr of 1.45 mg/dL (H)).   Assessment: 59 YOM presenting with shock. Significant cardiac history including MSSA endocarditis, mitral regurgitation, aortic stenosis. Also hx of ESRD and DVT. Presented with elevated troponins and ECHO showed moderate RV dysfunction and RV enlargement. Pharmacy consulted to dose heparin to rule out PE with potential ACS. No AC PTA. Patient now on CRRT, has also had intermittent Afib while in the ICU.   aPTT therapeutic at 83 sec on 2000 units/hr. Utilizing aPTT for monitoring as elevated tbili effects heparin levels, remains > 19.00. CBC stable, no signs of bleeding noted.   Goal of Therapy:  aPTT 66-102 sec Monitor platelets by anticoagulation protocol: Yes   Plan:  Continue heparin infusion at 2000 units/hr  Will only monitor aPTT in this pt with elevated tbili.  Daily aPTT, CBC Monitor for s/sx of bleeding.   Thank you for involving pharmacy in the patient's care.    Estill Batten, PharmD, BCCCP  04/08/2023 8:06 AM

## 2023-04-08 NOTE — Progress Notes (Signed)
Verbal order from Nephrology to keep patient net even and attempt to wean vasopressors.

## 2023-04-08 NOTE — Progress Notes (Signed)
[facility] Liaison Note Received request from hospice services at home after discharge. Spoke with nephew and sister to initiate education related to hospice philosophy, services, and team approach to care. Patient/family verbalized understanding of information given. Per discussion, the plan is for titrating off of cardiac medications and if he can tolerate that to discharge home to either his home or his nephew's home.  DME needs discussed. Patient has no DME currently in the home. Patient/family requests the following equipment for delivery; hospital bed, overbed table, Oxygen, bedside commode, walker, and a shower chair. The location of delivery is still to be determined and the family will notify me when the decision is made.   Please send signed and completed DNR home with patient/family. Please provide prescriptions at discharge as needed to ensure ongoing symptom management.   [facility] information and contact numbers given to family.   Please call with any questions or concerns.   Thank you for the opportunity to participate in this patient's care.   Glenna Fellows BSN, Charity fundraiser, OCN [employer] [phone]

## 2023-04-08 NOTE — Plan of Care (Signed)
  Problem: Clinical Measurements: Goal: Will remain free from infection Outcome: Progressing Goal: Diagnostic test results will improve Outcome: Progressing Goal: Respiratory complications will improve Outcome: Progressing   Problem: Nutrition: Goal: Adequate nutrition will be maintained Outcome: Progressing   Problem: Elimination: Goal: Will not experience complications related to bowel motility Outcome: Progressing   Problem: Clinical Measurements: Goal: Ability to maintain clinical measurements within normal limits will improve Outcome: Not Progressing Goal: Cardiovascular complication will be avoided Outcome: Not Progressing

## 2023-04-08 NOTE — Progress Notes (Signed)
Brief Nutrition Support Note  Pt able to have diet advanced this AM after FEES to DYS 2 with honey thick liquids. TF are requested to be reinitiated as it is unlikely pt will consume adequate nutrition and has been without for several days.   Estimated Nutritional Needs:  Kcal:  2300-2600 kcal/d Protein:  130-150 g/d Fluid:  2.3L/d  INTERVENTION:  Restart TF via cortrak. TF will meet ~75% of high end of estimated needs: Pivot 1.5 at 55 ml/h (1320 ml per day) Start at 25 and advance by 10 q8h to goal Provides 1980 kcal, 123 gm protein, 990 ml free water daily Monitor PO intake, if pt taking in inadequate amounts, increase rate back to meet 100% of estimated needs.   Greig Castilla, RD, LDN Clinical Dietitian RD pager # available in [facility]  After hours/weekend pager # available in [facility]

## 2023-04-08 NOTE — Procedures (Signed)
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 The aneurysm measures 1.4 x 1.1 cm which is  significantly larger than the 4.5 mm parent vessel.    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   Past Surgical History:  Past Surgical History:  Procedure Laterality Date   AV FISTULA PLACEMENT Left ?2005   forearm    BASCILIC VEIN TRANSPOSITION Left 12/27/2015    Procedure: FIRST STAGE BASILIC VEIN TRANSPOSITION;  Surgeon: Fransisco Hertz, MD;  Location: [facility] OR;  Service: Vascular;  Laterality: Left;   BASCILIC VEIN TRANSPOSITION Left 02/12/2016   Procedure: SECOND STAGE BASILIC VEIN TRANSPOSITION;  Surgeon: Fransisco Hertz, MD;  Location: [facility] OR;  Service: Vascular;  Laterality: Left;   CENTRAL LINE INSERTION  04/01/2023   Procedure: CENTRAL LINE INSERTION;  Surgeon: Dolores Patty, MD;  Location: [facility] INVASIVE CV LAB;  Service: Cardiovascular;;   EMBOLECTOMY Left 03/25/2014   Procedure: EMBOLECTOMY left popliteal;  Surgeon: Larina Earthly, MD;  Location: [facility] OR;  Service: Vascular;  Laterality: Left;   FEMORAL-POPLITEAL BYPASS GRAFT Left 03/25/2014   Procedure: Left Femoral- Below Knee Popliteal Bypass Graft;  Surgeon: Larina Earthly, MD;  Location: [facility] OR;  Service: Vascular;  Laterality: Left;   INTRAOPERATIVE TRANSESOPHAGEAL ECHOCARDIOGRAM N/A 04/05/2014   Procedure: INTRAOPERATIVE TRANSESOPHAGEAL ECHOCARDIOGRAM;  Surgeon: Purcell Nails, MD;  Location: [facility] OR;  Service: Open Heart Surgery;  Laterality: N/A;   LEFT AND RIGHT HEART CATHETERIZATION WITH CORONARY ANGIOGRAM N/A 03/28/2014   Procedure: LEFT AND RIGHT HEART CATHETERIZATION WITH CORONARY ANGIOGRAM;  Surgeon: Laurey Morale, MD;  [facility] CATH LAB;  Service: Cardiovascular;  Laterality: N/A;   MITRAL VALVE REPAIR Right 04/05/2014   Procedure: MINIMALLY INVASIVE MITRAL VALVE REPAIR (MVR);  Surgeon: Purcell Nails, MD;  Location: [facility] OR;  Service: Open Heart Surgery;  Laterality: Right;   MITRAL VALVE REPLACEMENT Right 04/05/2014   Procedure: Bring back MINIMALLY INVASIVE MITRAL VALVE (MV) REPLACEMENT - Reexploration for bleeding;  Surgeon:  Purcell Nails, MD;  Location: [facility] OR;  Service: Open Heart Surgery;  Laterality: Right;   PARATHYROIDECTOMY N/A 03/22/2016   Procedure: PARATHYROIDECTOMY AUTOTRANSPLANT;  Surgeon: Darnell Level, MD;  Location: [facility] OR;  Service: General;  Laterality: N/A;   PATCH ANGIOPLASTY  Left 07/23/2013   Procedure: PATCH ANGIOPLASTY- LEFT RADIOCEPHALIC ARTERIOVENOUS FISTULA;  Surgeon: Chuck Hint, MD;  Location: [facility] OR;  Service: Vascular;  Laterality: Left;   REVISON OF ARTERIOVENOUS FISTULA Left 11/04/2019   Procedure: REVISON OF LEFT ARM ARTERIOVENOUS FISTULA;  Surgeon: Larina Earthly, MD;  Location: [facility] OR;  Service: Vascular;  Laterality: Left;   RIGHT HEART CATH N/A 04/01/2023   Procedure: RIGHT HEART CATH;  Surgeon: Dolores Patty, MD;  Location: [facility] INVASIVE CV LAB;  Service: Cardiovascular;  Laterality: N/A;   RIGHT HEART CATH N/A 04/04/2023   Procedure: RIGHT HEART CATH;  Surgeon: Dolores Patty, MD;  Location: [facility] INVASIVE CV LAB;  Service: Cardiovascular;  Laterality: N/A;   SHUNTOGRAM Left November 04, 2011   TEE WITHOUT CARDIOVERSION N/A 03/24/2014   Procedure: TRANSESOPHAGEAL ECHOCARDIOGRAM (TEE);  Surgeon: Laurey Morale, MD;  Location: [facility] ENDOSCOPY;  Service: Cardiovascular;  Laterality: N/A;   TEE WITHOUT CARDIOVERSION N/A 04/12/2015   Procedure: TRANSESOPHAGEAL ECHOCARDIOGRAM (TEE);  Surgeon: Laurey Morale, MD;  Location: [facility] ENDOSCOPY;  Service: Cardiovascular;  Laterality: N/A;   TEE WITHOUT CARDIOVERSION N/A 08/18/2015   Procedure: TRANSESOPHAGEAL ECHOCARDIOGRAM (TEE);  Surgeon: Laurey Morale, MD;  Location: [facility] ENDOSCOPY;  Service: Cardiovascular;  Laterality: N/A;   HPI: Timothy Palmer is a 57 year old male who has been hospitalized for the past seven days and is being treated for septic and cardiogenic shock. Pt required ETT 9/21-9/27 with self extubation.  Presently on CRRT and requiring HHFNC. CXR 9/29 with "bilateral upper lobe perihilar opacities which may represent infiltrate or asymmetric edema. No focal airspace opacities." Pt with history of ESRD ( T, Th, Sat) , Systolic and diastolic CHF, HTN, Mitral valve repair ( 2015) and replacement 2/2 MSSA endocarditis(2016), Mycotic aneurysm, Upper extremity DVT(2015) and PVD.   Subjective: Pt awake,  alert, participative    Recommendations for follow up therapy are one component of a multi-disciplinary discharge planning process, led by the attending physician.  Recommendations may be updated based on patient status, additional functional criteria and insurance authorization.  Assessment / Plan / Recommendation     04/08/2023    9:14 AM  Clinical Impressions  Clinical Impression Pt presents with a moderate oropharyngeal dysphagia c/b impaired mastication, piecemeal deglutition, delayed swallow initiation, reduced base of tongue retraction, incomplete laryngeal closure, glottal insufficiency, and diminished sensation. These deficits resulted in silent aspiration of thin liquid and nectar thick liquid by straw, silent penetration of nectar thick liquid by cup and honey thick liquid by straw, and mild-moderate pharyngeal residue with solid textures.  There was no penetration of solid textures or honey thick liquid by cup sip.  Residue cleared with subsequent swallows.  Puree and simulated ground consistencies with less pharyngeal residuals than mechanical soft.  Suspect some spillover of oral residue with mechanical soft after swallow.  Pt was unable to masticate regular solid texture.  There was no esophageal backflow observed, but pt did have occasional belching.  Pt's voice is hoarse and breathy.  Vocal folds were both mobile on examination, but pt does not attain full adduction.  There are bilateral round, yellow excressences below the vocal folds which are suspected to be r/t intubation trauma (pt with 6 day  intubation, followed by self-extubation).    Recommend ground/chopped diet (dysphagia 2) with honey thick liquid by cup only, no straws.  Administer medications whole in puree or crushed if pt is having difficulty with oral manipulation of medication.    SLP Visit Diagnosis Dysphagia, oropharyngeal phase (R13.12)  Impact on safety and function Moderate aspiration risk         04/08/2023     9:14 AM  Treatment Recommendations  Treatment Recommendations Therapy as outlined in treatment plan below        04/08/2023    9:23 AM  Prognosis  Prognosis for improved oropharyngeal function Fair  Barriers to Reach Goals Time post onset       04/08/2023    9:14 AM  Diet Recommendations  SLP Diet Recommendations Dysphagia 2 (Fine chop) solids;Honey thick liquids  Liquid Administration via Cup;No straw  Medication Administration Whole meds with puree  Compensations Slow rate;Small sips/bites  Postural Changes Seated upright at 90 degrees         04/08/2023    9:14 AM  Other Recommendations  Oral Care Recommendations Oral care BID  Caregiver Recommendations Avoid jello, ice cream, thin soups, popsicles;Remove water pitcher;Have oral suction available  Follow Up Recommendations --  Functional Status Assessment Patient has had a recent decline in their functional status and demonstrates the ability to make significant improvements in function in a reasonable and predictable amount of time.       04/08/2023    9:14 AM  Frequency and Duration   Speech Therapy Frequency (ACUTE ONLY) min 2x/week  Treatment Duration 2 weeks         04/08/2023    9:08 AM  Oral Phase  Oral Phase Impaired  Oral - Honey Cup WFL  Oral - Nectar Cup WFL  Oral - Nectar Straw WFL  Oral - Thin Straw Premature spillage  Oral - Puree Piecemeal swallowing  Oral - Mech Soft Piecemeal swallowing  Oral - Regular Impaired mastication       04/08/2023    9:11 AM  Pharyngeal Phase  Pharyngeal Phase Impaired  Pharyngeal- Honey Cup WFL;Pharyngeal residue - pyriform;Pharyngeal residue - valleculae  Pharyngeal Material does not enter airway  Pharyngeal- Nectar Cup Delayed swallow initiation-vallecula;Reduced airway/laryngeal closure;Penetration/Aspiration during swallow;Pharyngeal residue - valleculae;Pharyngeal residue - pyriform  Pharyngeal Material enters airway, remains ABOVE vocal cords and not  ejected out  Pharyngeal- Nectar Straw Delayed swallow initiation-vallecula;Reduced airway/laryngeal closure;Penetration/Aspiration during swallow;Trace aspiration  Pharyngeal Material enters airway, remains ABOVE vocal cords and not ejected out;Material enters airway, passes BELOW cords without attempt by patient to eject out (silent aspiration)  Pharyngeal- Thin Straw Delayed swallow initiation-pyriform sinuses;Reduced airway/laryngeal closure;Penetration/Aspiration before swallow;Penetration/Aspiration during swallow  Pharyngeal Material enters airway, passes BELOW cords without attempt by patient to eject out (silent aspiration)  Pharyngeal- Puree Delayed swallow initiation-vallecula;Reduced tongue base retraction;Pharyngeal residue - valleculae  Pharyngeal Material does not enter airway  Pharyngeal- Mechanical Soft Delayed swallow initiation-vallecula;Reduced tongue base retraction  Pharyngeal Material does not enter airway        04/08/2023    9:14 AM  Cervical Esophageal Phase   Cervical Esophageal Phase [facility]     Kerrie Pleasure, MA, CCC-SLP Acute Rehabilitation Services Office: [phone] 04/08/2023, 9:23 AM

## 2023-04-08 NOTE — Progress Notes (Signed)
[name] NEPHROLOGY PROGRESS NOTE    Subjective: Seen and examined in ICU.  Patient is DNR.  Still on milrinone, Levophed, vasopressin. BP's dropped overnight and keeping even w/ CRRT this am.    Objective Vital signs in last 24 hours: Vitals:   04/07/23 1030 04/07/23 1045 04/07/23 1100 04/07/23 1115  BP:      Pulse: 69 75 67 66  Resp: (!) 41 (!) 33 (!) 23 (!) 31  Temp:      TempSrc:      SpO2: 100% 100% 99% 100%  Weight:      Height:        Physical Exam: General: Ill looking male, on HFNC O2 Heart:RRR, s1s2 nl Lungs: Coarse breath sound bilateral. Abdomen:soft, Non-tender, non-distended Extremities: Trace peripheral edema Dialysis Access: L AVF.  RIJ temp HD cath placed on 9/21.    [facility] ([facility]d since 2005)  4.5hr  450/1.5    119kg  2/2 bath  AVF LUA   Heparin 5000 - last OP HD 9/19, post wt 118.4kg  - rocaltrol 0.75 mcg po tts - sensipar 150 mg po tts - no esa (last Hb 11.6 on 9/19)   Assessment/ Plan:  # Combined septic and  cardiogenic shock - on IV abx and pressor support per PCCM.  # Severe AS / RV dysfunction/V tach: Status post TEE on 9/23 with no vegetation however so severe AAS, severe RV systolic dysfunction with elevated pulmonary pressure.  RHC on 9/27 with severe pulmonary artery hypertension despite milrinone, severe and end-stage PAH/cor pulmonale, recommended medical treatment and palliative consult.  # ESRD - HD TTS.  Currently on CRRT.  Still requiring high pressor support to maintain BP.  Volume status has improved. Given end-stage cardiac disease and multiple comorbidities, the prognosis is poor. Would keep even for now and try to wean off pressors so that we can attempt regular dialysis session.   # Volume - LE edema resolved. Down 17kg from dry wt if wt's are accurate (12kg down from admit wt). CXR's show poss vasc congestion vs normal CXR. Will keep even from here to see if pressors can be weaned off.   # Anemia esrd  - Hb at goal, monitor.   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  No binders while on CRRT.  # VDRF: Self extubated.  # Hypophosphatemia: sp Kphos. phos level acceptable today.  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    Timothy Moselle  MD  [facility] 04/08/2023, 3:47 PM  Recent Labs  Lab 04/07/23 0334 04/07/23 1553 04/08/23 0315  HGB 9.5*  --  9.6*  9.5*  ALBUMIN 2.6* 2.5* 2.6*  2.5*  CALCIUM 8.5* 8.2* 8.4*  PHOS 2.8 2.9 2.6  CREATININE 1.51* 1.47* 1.45*  K 3.9 4.4 4.3    Inpatient medications:  calcitRIOL  0.75 mcg Per Tube Q T,Th,Sat-1800   Chlorhexidine Gluconate Cloth  6 each Topical Daily   insulin aspart  0-15 Units Subcutaneous Q4H   multivitamin  1 tablet Per Tube QHS   polyethylene glycol  17 g Per Tube Daily   senna-docusate  1 tablet Per Tube Daily   sodium chloride flush  3 mL Intravenous Q12H     prismasol BGK 4/2.5 400 mL/hr at 04/08/23 1348    prismasol BGK 4/2.5 400 mL/hr at 04/08/23 1349   sodium chloride 10 mL/hr at 04/08/23 1500   amiodarone 30 mg/hr (04/08/23 1500)  feeding supplement (PIVOT 1.5 CAL) 25 mL/hr at 04/08/23 1500   heparin 2,000 Units/hr (04/08/23 1500)   milrinone 0.125 mcg/kg/min (04/08/23 1500)   norepinephrine (LEVOPHED) Adult infusion 28 mcg/min (04/08/23 1500)   piperacillin-tazobactam Stopped (04/08/23 1437)   prismasol BGK 4/2.5 1,500 mL/hr at 04/08/23 1409   [START ON April 18, 2023] vancomycin     vasopressin 0.04 Units/min (04/08/23 1500)   sodium chloride, acetaminophen, Gerhardt's butt cream, heparin, lip balm, mouth rinse, prochlorperazine, sodium chloride flush

## 2023-04-08 NOTE — Progress Notes (Addendum)
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 Has been hospitalized for the past seven days and is being treated for septic and cardiogenic shock. The PMT has been consulted to assist with additional goals of care conversations.   Today's Discussion 04/08/2023  *Please note that this is a verbal dictation therefore any spelling or grammatical errors are due to the "[employer]agon [employer] One" system interpretation.  Chart reviewed inclusive of vital signs, progress notes, laboratory results, and diagnostic images.   Per RN, Timothy Palmer had a tough night in the setting of elevated HR and decreased Bps requiring him to have increased pressor needs.   I met with Timothy Palmer - he did not remember the events from the night though he is clear about the desire to continue current treatments. I was honest about the severity of illness he is presently enduring and long term effects associated with his acute on chronic diseases notably his heart failure and renal disease.   I spoke with the CCM team as well as nephrology this morning. Keeping patient even with the anticipation to decrease pressors then see if Timothy Palmer could tolerate iHD. If he is unable to tolerate tx additional conversations will need to be held with he and his family regarding goals.  I spoke to patients sister, Timothy Palmer over the phone and updated her to the above.   Plan to continue allowing time for outcomes.  ___________________________________________ Addendum:  I met with Timothy Palmer' sister, Timothy Palmer and nephew, Timothy Palmer at bedside this morning. Timothy Palmer has been vocal about understanding his present situation. He shared with his nephew the desire to "get up out of here". It was emphasized that in doing so he would likely not live very long.  Timothy Palmer has been clear with his family that he understands his time will be limited.  I spoke with Timothy Palmer and Timothy Palmer outside the room. Timothy Palmer shares that he spoke to Timothy Palmer in detail last night in regards to his wishes. Timothy Palmer wants to get home. I expressed my concern that Timothy Palmer is on high dose pressors and I am not sure this will be a real possibility. I shared we can try to arrange hospice for home and allow some time to see if we could stabilize him. I shared if we are unable to I do not think discharging to Timothy Palmer's home will happen as Timothy Palmer would likely die in transition.   We discussed allowing the next 24 hours to see if Timothy Palmer could be titrated off pressors. If he cannot then we may need to consider comfort care in the hospital.   Questions and concerns addressed/Palliative Support Provided.   Additional Time: 55  Objective Assessment: Vital Signs Vitals:   04/08/23 0900 04/08/23 0915  BP:    Pulse: 84 83  Resp: (!) 42 (!) 44  Temp:    SpO2: 99% 100%    Intake/Output Summary (Last 24 hours) at 04/08/2023 0949 Last data filed at 04/08/2023 0910 Gross per 24 hour  Intake 2255.73 ml  Output 1612 ml  Net 643.73 ml   Last Weight  Most recent update: 04/08/2023  7:33 AM    Weight  104.9 kg (231 lb 4.2 oz)            Gen:  Middle aged AA M critically ill appearing HEENT: Dry mucous  membranes CV: Regular rate and rhythm  PULM: On 2LPM Talty, breathing is even and nonlabored ABD: slight distention though nontender  EXT: Generalized edema  Neuro: A+O x2-3  SUMMARY OF RECOMMENDATIONS   DNAR/DNI    Plan to continue current care allowing time for outcomes   Appreciate Chaplain support   Palliative care will continue to follow along    Billing based on MDM: High ______________________________________________________________________________________ Timothy Palmer Timothy Palmer Palliative Medicine Team Team Cell Phone: [phone] Please utilize secure chat with additional  questions, if there is no response within 30 minutes please call the above phone number  Palliative Medicine Team providers are available by phone from 7am to 7pm daily and can be reached through the team cell phone.  Should this patient require assistance outside of these hours, please call the patient's attending physician.

## 2023-04-08 NOTE — Plan of Care (Signed)
  Problem: Education: Goal: Knowledge of General Education information will improve Description: Including pain rating scale, medication(s)/side effects and non-pharmacologic comfort measures Outcome: Progressing   Problem: Clinical Measurements: Goal: Ability to maintain clinical measurements within normal limits will improve Outcome: Progressing Goal: Will remain free from infection Outcome: Progressing Goal: Diagnostic test results will improve Outcome: Progressing Goal: Respiratory complications will improve Outcome: Progressing Goal: Cardiovascular complication will be avoided Outcome: Progressing   Problem: Nutrition: Goal: Adequate nutrition will be maintained Outcome: Progressing   Problem: Coping: Goal: Level of anxiety will decrease Outcome: Progressing   Problem: Elimination: Goal: Will not experience complications related to bowel motility Outcome: Progressing   Problem: Pain Managment: Goal: General experience of comfort will improve Outcome: Progressing   Problem: Safety: Goal: Ability to remain free from injury will improve Outcome: Progressing   Problem: Skin Integrity: Goal: Risk for impaired skin integrity will decrease Outcome: Progressing   Problem: Fluid Volume: Goal: Ability to maintain a balanced intake and output will improve Outcome: Progressing   Problem: Metabolic: Goal: Ability to maintain appropriate glucose levels will improve Outcome: Progressing   Problem: Nutritional: Goal: Maintenance of adequate nutrition will improve Outcome: Progressing Goal: Progress toward achieving an optimal weight will improve Outcome: Progressing   Problem: Tissue Perfusion: Goal: Adequacy of tissue perfusion will improve Outcome: Progressing   Problem: Education: Goal: Understanding of CV disease, CV risk reduction, and recovery process will improve Outcome: Progressing   Problem: Activity: Goal: Ability to return to baseline activity level  will improve Outcome: Progressing    Patient is more alert and oriented during my shift than in previous days. Patient has not needed to be re-intubated and oxygen requirements have gone down. Patient is not complaining of anxiety or pain. Patient was started on a diet today by speech therapy and was started on tube feeds due to probable inability to intake enough nutrition.    Problem: Health Behavior/Discharge Planning: Goal: Ability to manage health-related needs will improve Outcome: Not Progressing   Problem: Activity: Goal: Risk for activity intolerance will decrease Outcome: Not Progressing   Problem: Education: Goal: Ability to describe self-care measures that may prevent or decrease complications (Diabetes Survival Skills Education) will improve Outcome: Not Progressing Goal: Individualized Educational Video(s) Outcome: Not Progressing   Problem: Coping: Goal: Ability to adjust to condition or change in health will improve Outcome: Not Progressing   Problem: Health Behavior/Discharge Planning: Goal: Ability to identify and utilize available resources and services will improve Outcome: Not Progressing Goal: Ability to manage health-related needs will improve Outcome: Not Progressing   Problem: Skin Integrity: Goal: Risk for impaired skin integrity will decrease Outcome: Not Progressing   Problem: Education: Goal: Individualized Educational Video(s) Outcome: Not Progressing   Problem: Cardiovascular: Goal: Ability to achieve and maintain adequate cardiovascular perfusion will improve Outcome: Not Progressing   Problem: Health Behavior/Discharge Planning: Goal: Ability to safely manage health-related needs after discharge will improve Outcome: Not Progressing   Patient, although more alert and oriented, is unable to care for himself or maintain his health without assistance. Patient is very deconditioned and unable to move within the bed without assistance which  puts him at risk of skin impairment.   Problem: Safety: Goal: Non-violent Restraint(s) Outcome: Completed/Met   No longer in restraints

## 2023-04-08 NOTE — Progress Notes (Addendum)
NAME:  Timothy Palmer, MRN:  [id number], DOB:  06-25-1966, LOS: 10 ADMISSION DATE:  03/29/2023, CONSULTATION DATE:  03/29/2023 REFERRING MD: EDP, CHIEF COMPLAINT: Shock  History of Present Illness:   57 year old male patient with history of ESRD ( T, Th, Sat) , Systolic and diastolic CHF, HTN, Mitral valve repair ( 2015) and replacement 2/2 MSSA  endocarditis(2016), Mycotic aneurysm, Upper extremity DVT(2015) and PVD. Presented to the Ed 9/21 with history or 4 days of diarrhea which resolved 9/20, with poor po intake and fatigue with weakness and body aches. In the ED he had Lactate of 11.9, Troponin of 900, bedside echo by ED MD showed decreased EF. Last echo 2016 showed EF of 50-55%. He is not followed by cardiology. He is not on blood thinners as they caused his HD graph to bleed. PCCM were notified by ED MD  and asked to admit the patient and manage care .  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   Significant Hospital Events: Including procedures, antibiotic start and stop dates in addition to other pertinent events   03/29/2023 Admission  - Stated he is weak and tired, has no energy.Currently on 2 L [place] with sats of 100%,HD is due  today, 9/21> echo with severe RV enlargement and pulmonary hypertension severe arctic stenosis that is calcified.  Nephrology started CRRT due to shock.  Patient went on pressors.  HD cath placed.  Later intubated.  Also had had wide-complex rhythm agonal respirations but appears did not lose any pulses.  Received cardioversion received stat hyperkalemia treatment.  Status post arterial line insertion 09/24: Heart cath showing severe right heart failure.  Started on inhaled nitric oxide and milrinone 9/28 repeat right heart cath shows some improvement but still has severe pulmonary hypertension.  No more options for advanced heart failure who has signed off.  Urology consulted for scrotal wound. 9/29 Self extubated. Made DNR after discussion with family  Interim History / Subjective:   Overnight events: Increased pressor requirement. Currently on 27 of Levophed, milrinone 0.125, is a 0.04.  Given albumin overnight  Patient evaluated  At bedside this morning.  Patient states he is not feeling well today.  He denies any shortness of breath.  He states he is feeling weak.  Did have goals of care conversation with patient.  He has state that he would like aggressive care.  He is okay being hooked up to machines and lines.  He states that it does not matter to him whether he passes away at home or in the hospital.  He would not like to go comfort care.  He would like aggressive care.  He understands his situation might not be repairable, but he would like aggressive care.  Objective   Blood pressure (!) 100/38, pulse 78, temperature 98.5 F (36.9 C), temperature source Axillary, resp. rate (!) 41, height 5\' 11"  (1.803 m), weight 101.9 kg, SpO2 100%. Blood pressure currently ranging  with maps between 56-72, satting at 100% on nasal cannula 2 L currently on Amio, milrinone, nor epi, cefepime, vasopressin  FiO2 (%):  [30 %] 30 %   Intake/Output Summary (Last 24 hours) at 04/08/2023 0725 Last data filed at  04/08/2023 0700 Gross per 24 hour  Intake 2165.1 ml  Output 1974 ml  Net 191.1 ml   Filed Weights   04/05/23 0446 04/06/23 0500 04/07/23 0500  Weight: 105.2 kg 105.2 kg 101.9 kg    Examination: General: Patient is resting comfortably in bed in no acute distress  Eyes: Pupils equal and reactive to light  Head: Normocephalic, atraumatic  Cardio: Regular rate and rhythm, no murmurs, rubs or gallops Pulmonary: coarse breath sounds throughout  Abdomen: Distended  Neuro: Alert and orientated x2. Able to follow all instructions Skin: No rashes noted   Lab/imaging reviewed Glucose 106-134 Hepatic function panel: Albumin 2.6, AST 58, ALT 82, bilirubin 19.5 Co. ox: O2 saturation 62% RFP showing sodium sodium 135, potassium 4.3, creatinine 1.45 Magnesium: 2.6 CBC white count 32.2, hemoglobin 9.5  Resolved Hospital Problem list     Assessment & Plan:   Obtain Smear, Steroids?   #Combined shock with cardiogenic and septic #Severe aortic valve stenosis Pressor requirements are increasing.  He is now on 27 of Levophed.  He is still on vasopressin and milrinone.  He denies any shortness of breath today.  Did have goals of care conversation, and patient states he would still like aggressive care. He would like all treatments that can be offered. Heart failure team states there is nothing more they can offer at this time.  Palliative care is following.   -Cefepime has been finished -Continue inotropic support with milrinone -Continue Levophed and vasopressin -MAP goal greater than 65 -Continue serial COOX measurements -Palliative care following -Start Vanc and Zosyn day 1   #Acute hypoxemic respiratory failure #Concern for aspiration pneumonia Patient is currently on high flow nasal cannula.  He is down to 2 L.  He denies any shortness of breath.  Improving.  -Finished antibiotic treatment with cefepime for 10 days -Wean down oxygen as tolerated -Continue cefepime day 10 of  10  #Ileus Patient did have 2 bowel movements yesterday.  Will continue with bowel regimen. -Optimize bowel regimen -Monitor for bowel movements -Start trickle feeds   #Leukocytosis Leukocytes are slowly increasing.  Up to 32,000 this morning.  Patient remains afebrile.  No clear source of infection the patient does have 1.  Patient did complete 10-day course of cefepime.  Will obtain smear to further evaluate for leukocytosis. -Follow-up blood smear -Follow-up differential -Start Vanc and zosyn day 1  -Repeat blood cultures   #Scrotal edema and skin sloughing Urology signed off. Will continue to with wound care -Continue with wound care   #History of V. Tach #A flutter Currently doing well on amiodarone drip.  Did have some slight sinus tachycardia which has improved. Will continue heparin and amiodarone at this time. -Patient is rate controlled at this time  -Continue amiodarone drip -Continue on telemetry  #ESRD #Hypokalemia #Hyponatremia Patient did not tolerate pulling fluid off CRRT yesterday.  Blood pressures blood bottom out.  -Continue CRRT per nephrology  #Hyperbilirubinemia Bilirubin seems to be elevated still.  This is likely in setting of congestive hepatopathy.   -Continue inotropic support  -Will pull off fluid as tolerated   #Goals of care DNR. Palliative consulted  Best Practice (right click and "Reselect all SmartList Selections" daily)   Diet/type: tubefeeds DVT prophylaxis: systemic  heparin GI prophylaxis: PPI Lines: Central line Foley:  Yes, and it is still needed Code Status:  full code  Critical care time:    The patient is critically ill with multiple organ system failure and requires high complexity decision making for assessment and support, frequent evaluation and titration of therapies, advanced monitoring, review of radiographic studies and interpretation of complex data.   Critical Care Time devoted to patient care services, exclusive  of separately billable procedures, described in this note is 35 minutes.   Modena Slater, DO  Internal Medicine Resident PGY-2 [phone]

## 2023-04-08 NOTE — Plan of Care (Signed)
  Problem: Clinical Measurements: Goal: Ability to maintain clinical measurements within normal limits will improve Outcome: Progressing Goal: Will remain free from infection Outcome: Progressing Goal: Respiratory complications will improve Outcome: Progressing Goal: Cardiovascular complication will be avoided Outcome: Progressing   Problem: Nutrition: Goal: Adequate nutrition will be maintained Outcome: Progressing   Problem: Coping: Goal: Level of anxiety will decrease Outcome: Progressing   Problem: Elimination: Goal: Will not experience complications related to bowel motility Outcome: Progressing   Problem: Pain Managment: Goal: General experience of comfort will improve Outcome: Progressing

## 2023-04-08 NOTE — Progress Notes (Signed)
   04/08/23 1456  Spiritual Encounters  Type of Visit Follow up  Care provided to: Pt and family  Referral source Family  Reason for visit Urgent spiritual support  OnCall Visit No   Visited with patient and provided spiritual care. Patient fears diagnosis and what will happen to his three sons. Patient declared his faith belief and requested prayer. Provided scriptural context for patients needs, as requested by patient and prayer. Patient's ex-spouse arrived to show support. Converse with both about the future care of youngest son who is "patient's favorite" and special son as the son is autistic. Patient expressed emotions.   Talked with nurse about change in patient's condition and families reaction.

## 2023-04-09 DIAGNOSIS — Z66 Do not resuscitate: Secondary | ICD-10-CM

## 2023-04-09 DIAGNOSIS — Z515 Encounter for palliative care: Secondary | ICD-10-CM | POA: Diagnosis not present

## 2023-04-09 DIAGNOSIS — R579 Shock, unspecified: Secondary | ICD-10-CM | POA: Diagnosis not present

## 2023-04-09 DIAGNOSIS — Z7189 Other specified counseling: Secondary | ICD-10-CM | POA: Diagnosis not present

## 2023-04-09 LAB — COOXEMETRY PANEL
Carboxyhemoglobin: 3.4 % — ABNORMAL HIGH (ref 0.5–1.5)
Methemoglobin: <0.7 % (ref 0.0–1.5)
O2 Saturation: 65.8 %
Total hemoglobin: 9.6 g/dL — ABNORMAL LOW (ref 12.0–16.0)

## 2023-04-09 LAB — HEPATIC FUNCTION PANEL
ALT: 64 U/L — ABNORMAL HIGH (ref 0–44)
AST: 45 U/L — ABNORMAL HIGH (ref 15–41)
Albumin: 2.8 g/dL — ABNORMAL LOW (ref 3.5–5.0)
Alkaline Phosphatase: 130 U/L — ABNORMAL HIGH (ref 38–126)
Bilirubin, Direct: 14.1 mg/dL — ABNORMAL HIGH (ref 0.0–0.2)
Indirect Bilirubin: 6.7 mg/dL — ABNORMAL HIGH (ref 0.3–0.9)
Total Bilirubin: 20.8 mg/dL (ref 0.3–1.2)
Total Protein: 8.1 g/dL (ref 6.5–8.1)

## 2023-04-09 LAB — RENAL FUNCTION PANEL
Albumin: 2.8 g/dL — ABNORMAL LOW (ref 3.5–5.0)
Anion gap: 12 (ref 5–15)
BUN: 24 mg/dL — ABNORMAL HIGH (ref 6–20)
CO2: 23 mmol/L (ref 22–32)
Calcium: 8.7 mg/dL — ABNORMAL LOW (ref 8.9–10.3)
Chloride: 100 mmol/L (ref 98–111)
Creatinine, Ser: 1.56 mg/dL — ABNORMAL HIGH (ref 0.61–1.24)
GFR, Estimated: 52 mL/min — ABNORMAL LOW (ref >60)
Glucose, Bld: 163 mg/dL — ABNORMAL HIGH (ref 70–99)
Phosphorus: 3.1 mg/dL (ref 2.5–4.6)
Potassium: 4.3 mmol/L (ref 3.5–5.1)
Sodium: 135 mmol/L (ref 135–145)

## 2023-04-09 LAB — CBC
HCT: 25.9 % — ABNORMAL LOW (ref 39.0–52.0)
Hemoglobin: 9.3 g/dL — ABNORMAL LOW (ref 13.0–17.0)
MCH: 28.7 pg (ref 26.0–34.0)
MCHC: 35.9 g/dL (ref 30.0–36.0)
MCV: 79.9 fL — ABNORMAL LOW (ref 80.0–100.0)
Platelets: 180 10*3/uL (ref 150–400)
RBC: 3.24 MIL/uL — ABNORMAL LOW (ref 4.22–5.81)
RDW: 22.3 % — ABNORMAL HIGH (ref 11.5–15.5)
WBC: 35.9 10*3/uL — ABNORMAL HIGH (ref 4.0–10.5)
nRBC: 12.8 % — ABNORMAL HIGH (ref 0.0–0.2)

## 2023-04-09 LAB — GLUCOSE, CAPILLARY
Glucose-Capillary: 152 mg/dL — ABNORMAL HIGH (ref 70–99)
Glucose-Capillary: 147 mg/dL — ABNORMAL HIGH (ref 70–99)
Glucose-Capillary: 100 mg/dL — ABNORMAL HIGH (ref 70–99)

## 2023-04-09 LAB — MAGNESIUM: Magnesium: 2.7 mg/dL — ABNORMAL HIGH (ref 1.7–2.4)

## 2023-04-09 LAB — APTT: aPTT: 96 s — ABNORMAL HIGH (ref 24–36)

## 2023-04-09 MED ORDER — SODIUM BICARBONATE 8.4 % IV SOLN
50.0000 meq | Freq: Once | INTRAVENOUS | Status: AC
  Administered 2023-04-09: 50 meq via INTRAVENOUS
  Filled 2023-04-09: qty 50

## 2023-04-09 NOTE — Discharge Instructions (Addendum)
Mr. Timothy Palmer,  It was a pleasure taking care of you at [facility]. You were admitted for heart failure.  You will be going home on hospice. The hospice team will take great care of you.  Take care,  Dr. Modena Slater, DO  Dr. Audie Box, DO

## 2023-04-09 NOTE — Progress Notes (Signed)
[name] NEPHROLOGY PROGRESS NOTE    Subjective: Seen and examined in ICU.  Patient is DNR.  Still on milrinone, Levophed, vasopressin. BP's dropped overnight and keeping even w/ CRRT this am.    Objective Vital signs in last 24 hours: Vitals:   04/07/23 1030 04/07/23 1045 04/07/23 1100 04/07/23 1115  BP:      Pulse: 69 75 67 66  Resp: (!) 41 (!) 33 (!) 23 (!) 31  Temp:      TempSrc:      SpO2: 100% 100% 99% 100%  Weight:      Height:        Physical Exam: General: Ill looking male, on HFNC O2 Heart:RRR, s1s2 nl Lungs: Coarse breath sound bilateral. Abdomen:soft, Non-tender, non-distended Extremities: Trace peripheral edema Dialysis Access: L AVF.  RIJ temp HD cath placed on 9/21.    OP HD: [facility] (on hd since 2005)  4.5hr  450/1.5    119kg  2/2 bath  AVF LUA   Heparin 5000 - last OP HD 9/19, post wt 118.4kg  - rocaltrol 0.75 mcg po tts - sensipar 150 mg po tts - no esa (last Hb 11.6 on 9/19)   Assessment/ Plan:  # Combined septic and  cardiogenic shock - on IV abx and pressor support per PCCM.  # Severe AS / RV dysfunction/V tach: Status post TEE on 9/23 with no vegetation however so severe AAS, severe RV systolic dysfunction with elevated pulmonary pressure.  RHC on 9/27 with severe pulmonary artery hypertension despite milrinone, severe and end-stage PAH/cor pulmonale, recommended medical treatment and palliative consult. Palliative care to meet again with the patient this am as outlook is very poor and he remains on high-dose pressors/ inotropes w/o improvement.   # ESRD - HD TTS.  Currently on CRRT.  Still requiring high pressor support to maintain BP. Given end-stage cardiac disease and multiple comorbidities, the prognosis is poor. Have been keeping even overnight but still not able to do this since the BP's have been persistently low requiring fluid boluses. Cont CRRT.  Await family/ palliative care meeting today.   # Volume - LE edema  resolved. Down 17kg from dry wt if wt's are accurate (12kg down from admit wt). Recent CXR's show poss vasc congestion vs normal CXR. Keeping even.   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  No binders while on CRRT.    Vinson Moselle  MD  [facility] 04/10/2023, 1:06 PM  Recent Labs  Lab 04/08/23 0315 04/08/23 1546 05/05/2023 0316  HGB 9.6*  9.5*  --  9.3*  ALBUMIN 2.6*  2.5* 2.9* 2.8*  2.8*  CALCIUM 8.4* 8.5* 8.7*  PHOS 2.6 2.9 3.1  CREATININE 1.45* 1.59* 1.56*  K 4.3 4.4 4.3    Inpatient medications:  multivitamin  1 tablet Per Tube QHS     prismasol BGK 4/2.5 400 mL/hr at 05/05/2023 0221    prismasol BGK 4/2.5 400 mL/hr at 04/23/2023 0221   milrinone 0.125 mcg/kg/min (05/03/2023 1100)   norepinephrine (LEVOPHED) Adult infusion 40 mcg/min (04/16/2023 1100)   prismasol BGK 4/2.5 1,300 mL/hr at 04/17/2023 1252   vasopressin 0.04 Units/min (04/20/2023 1100)   acetaminophen

## 2023-04-09 NOTE — Progress Notes (Addendum)
NAME:  Timothy Palmer, MRN:  [id number], DOB:  1966/04/21, LOS: 11 ADMISSION DATE:  03/29/2023, CONSULTATION DATE:  03/29/2023 REFERRING MD: EDP, CHIEF COMPLAINT: Shock  History of Present Illness:   57 year old male patient with history of ESRD ( T, Th, Sat) , Systolic and diastolic CHF, HTN, Mitral valve repair ( 2015) and replacement 2/2 MSSA  endocarditis(2016), Mycotic aneurysm, Upper extremity DVT(2015) and PVD. Presented to the Ed 9/21 with history or 4 days of diarrhea which resolved 9/20, with poor po intake and fatigue with weakness and body aches. In the ED he had Lactate of 11.9, Troponin of 900, bedside echo by ED MD showed decreased EF. Last echo 2016 showed EF of 50-55%. He is not followed by cardiology. He is not on blood thinners as they caused his HD graph to bleed. PCCM were notified by ED MD  and asked to admit the patient and manage care .  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   Significant Hospital Events: Including procedures, antibiotic start and stop dates in addition to other pertinent events   03/29/2023 Admission  - Stated he is weak and tired, has no energy.Currently on 2 L [place] with sats of 100%,HD is due  today, 9/21> echo with severe RV enlargement and pulmonary hypertension severe arctic stenosis that is calcified.  Nephrology started CRRT due to shock.  Patient went on pressors.  HD cath placed.  Later intubated.  Also had had wide-complex rhythm agonal respirations but appears did not lose any pulses.  Received cardioversion received stat hyperkalemia treatment.  Status post arterial line insertion 09/24: Heart cath showing severe right heart failure.  Started on inhaled nitric oxide and milrinone 9/28 repeat right heart cath shows some improvement but still has severe pulmonary hypertension.  No more options for advanced heart failure who has signed off.  Urology consulted for scrotal wound. 9/29 Self extubated. Made DNR after discussion with family  Interim History / Subjective:   Overnight events: Episodes of vomiting overnight.   Patient evaluated at bedside this morning.  Patient reports he is doing okay.  He does report some shortness of breath.  Objective   Blood pressure (!) 100/38, pulse 77, temperature 98.1 F (36.7 C), temperature source Oral, resp. rate (!) 43, height 5\' 11"  (1.803 m), weight 100.6 kg, SpO2 97%.      Intake/Output Summary (Last 24 hours) at 2023/04/30 1036 Last data filed at 2023/04/30 1000 Gross per 24 hour  Intake 3471.77 ml  Output 2860 ml  Net 611.77 ml   Filed Weights   04/07/23 0500 04/08/23 0705 April 30, 2023 0450  Weight: 101.9 kg 104.9 kg 100.6 kg    Examination: General: Patient is resting comfortably in bed in no acute distress  Eyes: Pupils equal and reactive to light  Head: Normocephalic, atraumatic  Cardio: Regular rate and rhythm, no murmurs, rubs or gallops Pulmonary: coarse breath sounds throughout  Abdomen: Distended, nontender, hypoactive bowel sounds  Neuro: Alert and orientated x2. Able to follow all instructions Skin: No rashes noted  Resolved Hospital Problem list     Assessment & Plan:   #Combined shock with cardiogenic and  septic #Severe aortic valve stenosis Overnight, patient did have increased pressor requirements.  He is maxed out on Levophed at this time.  He is on milrinone as well as vasopressin.  We did try to keep patient even yesterday, and this still did not help.  Do not think patient will make it out of this hospitalization.  Will need to have further goals of care conversation with the patient and family today.  If he is not able to come off these pressors which I anticipate he will not be able to, he will likely have in-hospital death.  Unfortunately there is not much more we can offer at this time.  Will continue with broad-spectrum antibiotics.  Will continue with pressor therapy.  -Continue Vanco and Zosyn day 2 -Palliative care following, needs further goals of care conversation -Continue inotropic support with milrinone -Continue Levophed and vasopressin -MAP goal greater than 65 -Continue serial COOX measurements  #Acute hypoxemic respiratory failure #Concern for aspiration pneumonia Still on high flow nasal cannula.  He has still having some short of breath.  Will try to wean off oxygen as tolerated.  Unsure if patient will be able to do this.  This could likely be secondary to volume overload.  Also underlying aspiration likely is contributing -Wean down oxygen as tolerated -Continue Vanc and zosyn day 2   #Ileus Patient did have emesis overnight, patient did have some distention today. -Optimize bowel regimen -Monitor for bowel movements -can try to give trickle feeds today   #Leukocytosis Leukocytes are slowly increasing.  Up to 35,000 this morning.  Patient remains afebrile.  No clear source of infection the patient. Smear was not revealing. -Continue Vanco and Zosyn day 2 -Continue to follow cultures -Differential did show increased neutrophils  #Scrotal edema and skin sloughing Urology signed off. Will continue to with wound care -Continue with wound care   #History of V.  Tach #A flutter #QTc prolongation Currently doing well on amiodarone drip.  Did have some slight sinus tachycardia which has improved. Will continue heparin and amiodarone at this time. -Serial EKGs for QTc prolongation -Patient is rate controlled at this time  -Continue amiodarone drip -Continue on telemetry  #ESRD #Hypokalemia #Hyponatremia Keeping patient even at this time. -Continue CRRT per nephrology  #Hyperbilirubinemia Bilirubin seems to be elevated still.  This is likely in setting of congestive hepatopathy.  Continues to trend up.  Patient cannot tolerate pulling fluid off CRRT. -Continue inotropic support  -Will pull off fluid as tolerated   #Goals of care DNR. Palliative consulted  Overall, I do fear patient is approaching end-of-life.  Pressor requirements are going up.  Patient is not able to tolerate CRRT with pulling off fluid.  He will remain fluid overloaded.  Goals of care conversations need to be held again today.  I do not think there initial goal of home hospice will be attainable as patient is not able to get off pressors.  Patient will likely need to be on pressors, and once transitioned off pressors, anticipate patient will decompensate fairly quickly.   Best Practice (right click and "Reselect all SmartList Selections" daily)   Diet/type: tubefeeds DVT prophylaxis: systemic heparin GI prophylaxis: N/A Lines: Central line Foley:  N/A Code Status:  DNR  Critical care time:    The patient is  critically ill with multiple organ system failure and requires high complexity decision making for assessment and support, frequent evaluation and titration of therapies, advanced monitoring, review of radiographic studies and interpretation of complex data.   Critical Care Time devoted to patient care services, exclusive of separately billable procedures, described in this note is 35 minutes.   Modena Slater, DO  Internal Medicine Resident PGY-2 [phone]

## 2023-04-09 NOTE — Progress Notes (Addendum)
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 Has been hospitalized for the past seven days and is being treated for septic and cardiogenic shock. The PMT has been consulted to assist with additional goals of care conversations.   Today's Discussion 05-05-23  *Please note that this is a verbal dictation therefore any spelling or grammatical errors are due to the "Dragon Medical One" system interpretation.  Chart reviewed inclusive of vital signs, progress notes, laboratory results, and diagnostic images.   I met with Talhah this morning. He is alert and oriented. We reviewed the events from the night prior in the setting of his vomiting. We discussed that Timothy Palmer remains extremely ill and on supportive measures though I shared openly and honestly that I worry his time will be short no matter what we do under his given circumstances. He is aware of this. He and I discussed the idea of continued measures or transitioning our focus to keeping him comfortable. Timothy Palmer would like to be comfortable. I shared that I plan to speak to he and his family further once they arrives.   I met with Timothy Palmer at bedside this late morning in the presence of his sister, Timothy Palmer, nephew, Timothy Palmer, and niece, Timothy Palmer. We discussed that Timothy Palmer understands the risks of dying in a short period of time. Despite this he desire going home. I shared my worry that once taken off CRRT, pressors, milrinone, and heparin his time will be quite limited. Despite this he and his family all vocalize that they want an effort made to get him home. I shared that he has a high likelihood of dying before leaving the hospital and if not then than in transit. Patient and his family agree and accept these risks. They understand.  WE  reviewed that I would reach out to hospice to have the home set up in terms of DME and once the ambulance arrives for transit we would stop present interventions while he loads onto the gurney.   I have spoke to [facility], the primary medical team, and Marguerita Beards of [facility] regarding this fragile matter.   At this time plan will be to transition to patients home with hospice. Family and patient accept the risk that he may die in transit.  ______________________________________ Addendum:  I met with and spoke to Timothy Palmer regarding the plan for discharge.  Per [facility] a nurse has been arranged.   PTAR has been set up.  DME's not yet delivered though patients sister is in agreement with having him transition to his own bed once he gets home.  Provided bereavement resources to Timothy Palmer.   Education on what to expect provided.  Verified again with Timothy Palmer that these are his wishes to transport despite the risk of death prior to or during transit. He again states agreement with this plan.   Questions and concerns addressed.   Additional Time: 45  Objective Assessment: Vital Signs Vitals:   05-05-23 1125 2023/05/05 1130  BP:    Pulse:  82  Resp:  (!) 36  Temp: 97.6 F (36.4 C)   SpO2:  99%    Intake/Output Summary (Last 24 hours) at 05-May-2023 1156 Last data filed at 05-05-23 1100 Gross per 24 hour  Intake 3438.84 ml  Output 2791.2 ml  Net 647.64 ml  Last Weight  Most recent update: 05/05/23  4:51 AM    Weight  100.6 kg (221 lb 12.5 oz)            Gen:  Middle aged AA M critically ill appearing HEENT: Dry mucous membranes CV: Regular rate and rhythm  PULM: On 2LPM [place], breathing is even and nonlabored ABD: slight distention though nontender  EXT: Generalized edema  Neuro: A+O x3  SUMMARY OF RECOMMENDATIONS   DNAR/DNI    Appreciate TOC and Hospice ([facility]) coordination for discharge home with hospice  Patient and his family agree and understand that he may  die prior to transport or actively in transport to his home  Patients last wish is to try to get home  Total Time: 41 ______________________________________________________________________________________ Timothy Palmer  Palliative Medicine Team Team Cell Phone: [phone] Please utilize secure chat with additional questions, if there is no response within 30 minutes please call the above phone number  Palliative Medicine Team providers are available by phone from 7am to 7pm daily and can be reached through the team cell phone.  Should this patient require assistance outside of these hours, please call the patient's attending physician.

## 2023-04-09 NOTE — TOC Initial Note (Addendum)
Transition of Care [facility]) - Initial/Assessment Note    Patient Details  Name: Timothy Palmer MRN: [id number] Date of Birth: [birth year]  [facility]) CM/SW Contact:    Elliot Cousin, RN Phone Number: [phone] 02-May-2023, 12:09 PM  Clinical Narrative:   HF TOC CM spoke to pt and family at bedside. Offered choice for Home Hospice. [facility] was selected and arranged by Palliative team. Pt wants to go home. [facility] is working on getting DME delivered to home and having Hospice RN meet pt and family at home. And educated that possible demise in transport, that PTAR may decline transport.                 Expected Discharge Plan: Home w Hospice Care Barriers to Discharge: No Barriers Identified   Patient Goals and CMS Choice Patient states their goals for this hospitalization and ongoing recovery are:: wants to go home CMS Medicare.gov Compare Post Acute Care list provided to:: Patient Choice offered to / list presented to : Patient      Expected Discharge Plan and Services   Discharge Planning Services: CM Consult Post Acute Care Choice: Hospice Living arrangements for the past 2 months: Single Family Home                           HH Arranged: RN [facility] Agency: [facility] Date [facility] Agency Contacted: May 02, 2023 Time HH Agency Contacted: 1208 Representative spoke with at [facility] Agency: Glenna Fellows  Prior Living Arrangements/Services Living arrangements for the past 2 months: Single Family Home Lives with:: Self Patient language and need for interpreter reviewed:: Yes Do you feel safe going back to the place where you live?: Yes      Need for Family Participation in Patient Care: Yes (Comment) Care giver support system in place?: Yes (comment)   Criminal Activity/Legal Involvement Pertinent to Current Situation/Hospitalization: No - Comment as needed  Activities of Daily Living      Permission Sought/Granted Permission  sought to share information with : Case Manager, Family Supports Permission granted to share information with : Yes, Verbal Permission Granted  Share Information with NAME: Jillyn Hidden Scales  Permission granted to share info w AGENCY: Home Hospice  Permission granted to share info w Relationship: nephew  Permission granted to share info w Contact Information: [phone]  Emotional Assessment Appearance:: Appears stated age Attitude/Demeanor/Rapport: Unable to Assess Affect (typically observed): Unable to Assess Orientation: : Oriented to Self, Oriented to Place, Oriented to  Time, Oriented to Situation Alcohol / Substance Use: Not Applicable Psych Involvement: No (comment)  Admission diagnosis:  Lactic acidosis [E87.20] Shock (HCC) [R57.9] Hypoglycemia [E16.2] Hypotension [I95.9] Diarrhea, unspecified type [R19.7] Patient Active Problem List   Diagnosis Date Noted   VT (ventricular tachycardia) (HCC) 04/02/2023   Acute respiratory failure with hypoxia (HCC) 03/31/2023   Right heart failure (HCC) 03/30/2023   Hypotension 03/29/2023   Shock (HCC) 03/29/2023   Lactic acidosis 03/29/2023   Severe aortic stenosis 03/29/2023   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   Hyperparathyroidism, secondary (HCC) 03/21/2016   Lung nodules    ESRD on dialysis [facility])    Mediastinal adenopathy    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   Renal mass    Cavitary lesion of lung 04/06/2015   Mitral valve replaced 04/06/2015   Anticoagulant long-term use 04/06/2015   Anemia in chronic kidney disease 04/06/2015   Coagulopathy (HCC) 04/06/2015  Multiple pulmonary nodules    Endocarditis 04/05/2015   Weakness of left lower extremity 11/14/2014   Encounter for therapeutic drug monitoring 06/16/2014   Paroxysmal atrial fibrillation (HCC) 06/13/2014   S/P minimally invasive mitral valve repair 04/05/2014   Septic embolism to left lower extremity 03/25/2014   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 03/24/2014   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   Chronic diastolic congestive heart failure (HCC)    Mitral valve vegetation 03/23/2014   Severe mitral regurgitation 03/23/2014   Hypertension 03/22/2014   Acute bronchitis 03/22/2014   T wave inversion in EKG 03/22/2014   QT prolongation 03/22/2014   Acute combined systolic and diastolic congestive heart failure (HCC) 03/21/2014   Bacterial endocarditis - MSSA positive blood cultures with mitral valve vegetation, severe mitral regurgitation, and septic embolization 03/21/2014   Knee pain, left 03/21/2014   DVT of upper extremity (deep vein thrombosis) (HCC) 03/04/2014   Positive blood culture 11/27/2013   End stage renal disease (HCC) 06/30/2013   PCP:  Annie Sable, MD Pharmacy:   [facility] [phone] - [place], [place] - [address] AT [facility] [address] [place] [address] Phone: [phone] Fax: [phone]     Social Determinants of Health (SDOH) Social History: SDOH Screenings   Tobacco Use: Low Risk  (03/29/2023)   SDOH Interventions:     Readmission Risk Interventions     No data to display

## 2023-04-09 NOTE — Discharge Summary (Addendum)
Name: Timothy Palmer MRN: [id number] DOB: 1966/03/27 57 y.o. PCP: Annie Sable, MD  Date of Admission: 03/29/2023  7:33 AM Date of Discharge: 04/30/23 Attending Physician: Dr. Audie Box   Discharge Diagnosis: Principal Problem:   Shock (HCC) Active Problems:   QT prolongation   S/P minimally invasive mitral valve repair   Paroxysmal atrial fibrillation (HCC)   ESRD on dialysis (HCC)   Hypotension   Lactic acidosis   Severe aortic stenosis   Right heart failure (HCC)   Acute respiratory failure with hypoxia (HCC)   VT (ventricular tachycardia) (HCC)    Discharge Medications: Allergies as of 04-30-23   No Known Allergies      Medication List     STOP taking these medications    feeding supplement (NEPRO CARB STEADY) Liqd   lisinopril 20 MG tablet Commonly known as: ZESTRIL   multivitamin Tabs tablet   sevelamer carbonate 800 MG tablet Commonly known as: RENVELA        Disposition and follow-up:   Timothy Palmer was discharged from [facility] in Critical condition.  At the hospital follow up visit please address:  1.  Follow-up:  a.  Severe end-stage right heart failure: Patient is going home with hospice.  Ensure hospice continues to follow outpatient.  2.  Labs / imaging needed at time of follow-up: N/A  3.  Pending labs/ test needing follow-up: N/A  4.  Medication Changes: Hospice to follow   Follow-up Appointments:   Hospital Course by problem list:  This is a 57 year old male with a past medical history of ESRD on dialysis presented to the emergency department concerns of hypotension.  Patient was subsequently admitted to the ICU for concerns of cardiogenic shock as well as septic shock.  #Combined shock with cardiogenic and septic #Severe Right heart failure  #Severe aortic valve stenosis #Pulmonary artery hypertension Patient initially presented to the emergency room with concerns of shock.  Patient had  concern for both cardiogenic and septic etiology.  Patient was treated with antibiotics, and up to discharge day remained on antibiotics.  During hospital course, patient had multiple right heart caths to evaluate for right heart dysfunction by the advanced heart failure team.  Patient was diagnosed with severe right heart failure.  During hospital course, patient was started on milrinone, Levophed, vasopressin.  Patient was also trialed on inhaled nitric oxide.  Patient was unable to be weaned off pressors and inotropic support.  During hospital course, patient was also intubated, and patient had ultimately self extubated and remained extubated.  Advanced heart failure team stated that this is severe right heart failure secondary to pulmonary artery hypertension and was irreversible.  They eventually signed off given they had no other options for the patient.  Palliative care followed along with multiple goals of care conversations.  Ultimately the plan was to have patient go home with hospice.  Did have long conversation with family members and patient about the high risk of sending him home as patient has yet to be weaned off pressors.  Palliative care following along with these conversations and had their own conversations.  Ultimately decision was made to attempt to take patient home.  Plan will be for patient to have hospice at home, plan ambulance to transport.  Once ambulance transport is ready, can stop pressors.     #Acute hypoxemic respiratory failure #Concern for aspiration pneumonia Patient is required to be intubated during hospital course due to shock.  Patient has since  been self extubated.  He tolerated being off mechanical ventilation well.  He did require high flow nasal cannula.  Patient will be going home with home hospice.     #Ileus During hospital course, there was some concern that patient had ileus.  He did have multiple bowel movements, and eventually did resolve.    #Leukocytosis Patient had leukocytosis up to 35,000 during hospitalization.  Did have broad-spectrum antibiotics.  Unclear etiology, but likely had underlying infection that we were treating.  Patient to go home on home hospice   #Scrotal edema and skin sloughing Hospital course, there was concerns about scrotal edema and skin sloughing.  Urology was consulted.  This was thought to be unrelated to infection.  Wound care was consulted.  Urology did not do any procedures.   #History of V. Tach #A flutter #QTc prolongation During hospital course, patient did have a flutter and as well as QTc prolongation.  Patient did have SVT into the 200s as well.  He was on amiodarone drip and had an drip.  Patient ultimately was rate controlled.  Did not go back into any arrhythmias.  Patient to go home on home hospice.    #ESRD #Hypokalemia #Hyponatremia Patient was on CRRT while patient was in the hospital.  Nephrology followed.  Ultimately patient did decide to go hospice.  Will stop CRRT on discharge.  Will start dialysis outpatient.  Electrolytes corrected.   #Hyperbilirubinemia Patient had elevated bilirubin during hospitalization.  This is thought to be secondary to congestive hepatopathy.  Bilirubin elevated at discharge.   #Goals of care DNR. Palliative consulted.  Multiple conversations were held during hospitalization in terms of goals of care.  Ultimately it was decided that patient would like to go home on hospice.  Given concern for increased pressor requirement, we shared our concern that patient may not be able to make it home once pressors are turned off.  Patient voiced that he would like to at least try this.  Patient understands that he does have a high risk of death in transport home. Patient will be sent home with home hospice. Plan will be to turn pressors off once transport is ready.   Discharge Subjective:  Patient evaluated at bedside this morning.  Patient reports he is doing  okay.  He does report some shortness of breath.   Discharge Exam:   BP (!) 100/38   Pulse 80   Temp 97.6 F (36.4 C) (Oral)   Resp (!) 39   Ht 5\' 11"  (1.803 m)   Wt 100.6 kg   SpO2 100%   BMI 30.93 kg/m  General: Patient is resting comfortably in bed in no acute distress  Eyes: Pupils equal and reactive to light  Head: Normocephalic, atraumatic  Cardio: Regular rate and rhythm, no murmurs, rubs or gallops Pulmonary: coarse breath sounds throughout  Abdomen: Distended, nontender, hypoactive bowel sounds Neuro: Alert and orientated x2. Able to follow all instructions Skin: No rashes noted  Pertinent Labs, Studies, and Procedures:     Latest Ref Rng & Units April 17, 2023    3:16 AM 04/08/2023    3:15 AM 04/07/2023    3:34 AM  CBC  WBC 4.0 - 10.5 K/uL 35.9  32.2    31.9  25.0   Hemoglobin 13.0 - 17.0 g/dL 9.3  9.5    9.6  9.5   Hematocrit 39.0 - 52.0 % 25.9  26.2    26.9  26.1   Platelets 150 - 400 K/uL 180  173  160  142        Latest Ref Rng & Units April 11, 2023    3:16 AM 04/08/2023    3:46 PM 04/08/2023    3:15 AM  CMP  Glucose 70 - 99 mg/dL 621  308  657   BUN 6 - 20 mg/dL 24  22  22    Creatinine 0.61 - 1.24 mg/dL 8.46  9.62  9.52   Sodium 135 - 145 mmol/L 135  133  135   Potassium 3.5 - 5.1 mmol/L 4.3  4.4  4.3   Chloride 98 - 111 mmol/L 100  96  95   CO2 22 - 32 mmol/L 23  19  23    Calcium 8.9 - 10.3 mg/dL 8.7  8.5  8.4   Total Protein 6.5 - 8.1 g/dL 8.1   7.6   Total Bilirubin 0.3 - 1.2 mg/dL 84.1   32.4   Alkaline Phos 38 - 126 U/L 130   137   AST 15 - 41 U/L 45   58   ALT 0 - 44 U/L 64   82     DG Chest Port 1 View  Result Date: 03/30/2023 CLINICAL DATA:  57 year old male with history of dyspnea. EXAM: PORTABLE CHEST 1 VIEW COMPARISON:  Chest x-ray 03/29/2023. FINDINGS: An endotracheal tube is in place with tip 5.3 cm above the carina. There is a right-sided internal jugular central venous catheter with tip terminating in the proximal superior vena cava. A  nasogastric tube is seen extending into the stomach, however, the tip of the nasogastric tube extends below the lower margin of the image. Transcutaneous defibrillator pads project over the right hemithorax and left upper quadrant of the abdomen. Lung volumes are low. Patchy areas of interstitial prominence an ill-defined opacities are noted in both lungs, most evident in the right upper lobe and left base. No pleural effusions. No pneumothorax. No evidence of pulmonary edema. Heart size is normal. The patient is rotated to the left on today's exam, resulting in distortion of the mediastinal contours and reduced diagnostic sensitivity and specificity for mediastinal pathology. Mitral annuloplasty ring or bioprosthetic valve noted. Atherosclerotic calcifications are noted in the thoracic aorta. IMPRESSION: 1. Support apparatus, as above. 2. Patchy areas of interstitial prominence an ill-defined opacities concerning for developing multilobar bilateral bronchopneumonia. 3. Aortic atherosclerosis. Electronically Signed   By: Trudie Reed M.D.   On: 03/30/2023 05:49   DG Abd Portable 1V  Result Date: 03/29/2023 CLINICAL DATA:  Orogastric tube placement EXAM: PORTABLE ABDOMEN - 1 VIEW COMPARISON:  03/29/2023 FINDINGS: Orogastric tube tip within the expected distal body of the stomach. Normal abdominal gas pattern. No free intraperitoneal gas. Patchy pulmonary infiltrates noted within the visualized lung bases bilaterally. IMPRESSION: 1. Orogastric tube tip within the expected distal body of the stomach. Electronically Signed   By: Helyn Numbers M.D.   On: 03/29/2023 22:14   DG Abd 1 View  Result Date: 03/29/2023 CLINICAL DATA:  [id number]. Encounter for imaging study to confirm orogastric tube placement. EXAM: ABDOMEN - 1 VIEW COMPARISON:  Earlier study today at 6:44 p.m. FINDINGS: 7:45 p.m. The pelvis is not included in the study. Interval new NGT insertion. The tip is in the upper stomach with the side hole at  the GE junction and needs to be advanced further in 10-12 cm. Small bowel dilatation continues in the upper abdomen, maximum caliber 4.3 cm, again concerning for small bowel obstruction or worsening ileus since the CT from this morning. Small bowel dilatation  was greater in the lower abdomen on the last film. This area is not evaluated. Electrical pads overlie the lower chest and there is overlying wiring. No supine evidence of free air. IMPRESSION: 1. NGT tip is in the upper stomach with the side hole at the GE junction and needs to be advanced further in 10-12 cm. 2. Persistent small bowel dilatation, maximum upper abdominal caliber 4.3 cm, concerning for small bowel obstruction or worsening ileus. Electronically Signed   By: Almira Bar M.D.   On: 03/29/2023 20:30   DG Abd 1 View  Result Date: 03/29/2023 CLINICAL DATA:  [id number] with abdominal distention. EXAM: ABDOMEN - 1 VIEW COMPARISON:  CT without contrast today at 9:23 a.m. FINDINGS: Generalized worsening small-bowel dilatation, maximum caliber was previously 2.7 cm now 5.5 cm worrisome for obstruction or worsening severe ileus. Scattered colonic aeration remains present at least to the distal descending colon. A rim calcified right renal cyst again superimposes over the lateral right iliac crest. There are vascular and vasa deferentia calcifications. No supine evidence of free air. No acute osseous findings. IMPRESSION: Generalized worsening small-bowel dilatation, maximum caliber was previously 2.7 cm now 5.5 cm. Findings are worrisome for small bowel obstruction or worsening severe ileus. Electronically Signed   By: Almira Bar M.D.   On: 03/29/2023 20:24   DG Chest Port 1 View  Result Date: 03/29/2023 CLINICAL DATA:  [id number].  History of ETT. EXAM: PORTABLE CHEST 1 VIEW COMPARISON:  Earlier study today at 6:40 p.m. FINDINGS: 7:23 p.m. there is overlying wiring and newly noted electrical pads. Interval intubation with ETT tip 2.8 cm from carina.  Right IJ catheter interval pullback to the junction of the right IJ/subclavian vein, previously in the upper SVC. Stable cardiomegaly with AVR. Central vessels are normal caliber. Low lung volumes. Increased scattered heterogeneous opacities left mid to lower lung fields which could be atelectasis or pneumonia. Small right pleural effusion again noted with elevated hemidiaphragm, right mid perihilar linear atelectasis. Stable mediastinum with widening superiorly, aortic atherosclerosis. No new osseous finding.  In all other respects no further changes. IMPRESSION: 1. Interval intubation with ETT tip 2.8 cm from carina. 2. Right IJ catheter interval pullback to the junction of the right IJ/subclavian vein. 3. Increased left mid to lower lung heterogeneous opacities which could be atelectasis or pneumonia. 4. Unchanged small right pleural effusion.  Cardiomegaly. Electronically Signed   By: Almira Bar M.D.   On: 03/29/2023 20:18   DG CHEST PORT 1 VIEW  Result Date: 03/29/2023 CLINICAL DATA:  [id number] Encounter for central line placement [id number] EXAM: PORTABLE CHEST 1 VIEW COMPARISON:  03/29/2023 FINDINGS: Interval placement of right internal jugular approach central venous catheter with distal tip terminating at the level of the mid SVC. Stable cardiomegaly. Aortic atherosclerosis. Low lung volumes with elevation of the right hemidiaphragm. Similar bilateral interstitial prominence. No pneumothorax. IMPRESSION: Interval placement of right internal jugular approach central venous catheter with distal tip terminating at the level of the mid SVC. No pneumothorax. Electronically Signed   By: Duanne Guess D.O.   On: 03/29/2023 19:33   ECHOCARDIOGRAM COMPLETE  Result Date: 03/29/2023    ECHOCARDIOGRAM REPORT   Patient Name:   ORYON BRUGGEMAN [facility] Date of Exam: 03/29/2023 Medical Rec #:  [id number]      Height:       71.0 in Accession #:    [id number]     Weight:       240.0 lb Date of Birth:  1965-12-11  BSA:           2.278 m Patient Age:    56 years       BP:           90/63 mmHg Patient Gender: M              HR:           63 bpm. Exam Location:  Inpatient Procedure: 2D Echo, Color Doppler and Cardiac Doppler Indications:    Shock  History:        Patient has prior history of Echocardiogram examinations, most                 recent 08/19/2015. Risk Factors:Hypertension. 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                  Mitral Valve: 28 mm Sorin Mitral Memo 3D prosthetic annuloplasty                 ring valve is present in the mitral position.  Sonographer:    Aron Baba Referring Phys: [id number] SARAH F GROCE IMPRESSIONS  1. Left ventricular ejection fraction, by estimation, is 60 to 65%. The left ventricle has normal function. The left ventricle demonstrates regional Palmer motion abnormalities (see scoring diagram/findings for description). There is mild left ventricular  hypertrophy. Left ventricular diastolic parameters are consistent with Grade I diastolic dysfunction (impaired relaxation). Elevated left ventricular end-diastolic pressure. There is the interventricular septum is flattened in systole and diastole, consistent with right ventricular pressure and volume overload.  2. Right ventricular systolic function is moderately reduced. The right ventricular size is severely enlarged. There is severely elevated pulmonary artery systolic pressure. The estimated right ventricular systolic pressure is 73.3 mmHg.  3. Left atrial size was mildly dilated.  4. Right atrial size was moderately dilated.  5. The mitral valve has been repaired/replaced. Trivial mitral valve regurgitation. No evidence of mitral stenosis. The mean mitral valve gradient is 4.0 mmHg with average heart rate of 68 bpm. There is a 28 mm Sorin Mitral Memo 3D prosthetic annuloplasty ring present in the mitral position.  6. The tricuspid valve is abnormal.  7. Cannot exclude aortic valve vegetation.  The aortic valve is calcified. There is severe calcifcation of the aortic valve. There is moderate thickening of the aortic valve. Aortic valve regurgitation is not visualized. Severe aortic valve stenosis. Aortic valve area, by VTI measures 0.53 cm. Aortic valve mean gradient measures 32.7 mmHg. Aortic valve Vmax measures 4.00 m/s. Peak gradient 64 mmHg, DI is 0.23.  8. The inferior vena cava is normal in size with <50% respiratory variability, suggesting right atrial pressure of 8 mmHg. Comparison(s): Changes from prior study are noted. 08/19/2015: LVEF 55-60%, MV s/p repair, no stenosis, mild AS. Conclusion(s)/Recommendation(s): Critical findings reported to Dr. Delton Coombes and acknowledged at 1:15 pm. FINDINGS  Left Ventricle: Left ventricular ejection fraction, by estimation, is 60 to 65%. The left ventricle has normal function. The left ventricle demonstrates regional Palmer motion abnormalities. The left ventricular internal cavity size was small. There is mild left ventricular hypertrophy. The interventricular septum is flattened in systole and diastole, consistent with right ventricular pressure and volume overload. Left ventricular diastolic parameters are consistent with Grade I diastolic dysfunction (impaired relaxation). Elevated left ventricular end-diastolic pressure. Right Ventricle: The right ventricular size is severely enlarged. No increase  in right ventricular Palmer thickness. Right ventricular systolic function is moderately reduced. There is severely elevated pulmonary artery systolic pressure. The tricuspid regurgitant velocity is 4.04 m/s, and with an assumed right atrial pressure of 8 mmHg, the estimated right ventricular systolic pressure is 73.3 mmHg. Left Atrium: Left atrial size was mildly dilated. Right Atrium: Right atrial size was moderately dilated. Pericardium: There is no evidence of pericardial effusion. Mitral Valve: The mitral valve has been repaired/replaced. Trivial mitral valve  regurgitation. There is a 28 mm Sorin Mitral Memo 3D prosthetic annuloplasty ring present in the mitral position. No evidence of mitral valve stenosis. MV peak gradient, 6.2 mmHg. The mean mitral valve gradient is 4.0 mmHg with average heart rate of 68 bpm. Tricuspid Valve: The tricuspid valve is abnormal. Tricuspid valve regurgitation is mild. Aortic Valve: Cannot exclude aortic valve vegetation. The aortic valve is calcified. There is severe calcifcation of the aortic valve. There is moderate thickening of the aortic valve. Aortic valve regurgitation is not visualized. Severe aortic stenosis is present. Aortic valve mean gradient measures 32.7 mmHg. Aortic valve peak gradient measures 64.1 mmHg. Aortic valve area, by VTI measures 0.53 cm. Pulmonic Valve: The pulmonic valve was normal in structure. Pulmonic valve regurgitation is not visualized. Aorta: The aortic root and ascending aorta are structurally normal, with no evidence of dilitation. Venous: The inferior vena cava is normal in size with less than 50% respiratory variability, suggesting right atrial pressure of 8 mmHg. IAS/Shunts: No atrial level shunt detected by color flow Doppler.  LEFT VENTRICLE PLAX 2D LVIDd:         5.80 cm   Diastology LVIDs:         5.10 cm   LV e' medial:    4.24 cm/s LV PW:         1.40 cm   LV E/e' medial:  29.0 LV IVS:        0.80 cm   LV e' lateral:   4.57 cm/s LVOT diam:     1.70 cm   LV E/e' lateral: 26.9 LV SV:         31 LV SV Index:   14 LVOT Area:     2.27 cm  RIGHT VENTRICLE RV S prime:     8.70 cm/s TAPSE (M-mode): 2.1 cm LEFT ATRIUM             Index        RIGHT ATRIUM           Index LA diam:        3.80 cm 1.67 cm/m   RA Area:     25.60 cm LA Vol (A2C):   33.6 ml 14.75 ml/m  RA Volume:   89.80 ml  39.41 ml/m LA Vol (A4C):   55.6 ml 24.40 ml/m LA Biplane Vol: 45.9 ml 20.15 ml/m  AORTIC VALVE AV Area (Vmax):    0.54 cm AV Area (Vmean):   0.48 cm AV Area (VTI):     0.53 cm AV Vmax:           400.33 cm/s AV  Vmean:          253.333 cm/s AV VTI:            0.592 m AV Peak Grad:      64.1 mmHg AV Mean Grad:      32.7 mmHg LVOT Vmax:         94.70 cm/s LVOT Vmean:        53.400  cm/s LVOT VTI:          0.138 m LVOT/AV VTI ratio: 0.23  AORTA Ao Root diam: 3.00 cm MITRAL VALVE                TRICUSPID VALVE MV Area (PHT): 3.12 cm     TR Peak grad:   65.3 mmHg MV Area VTI:   0.60 cm     TR Vmax:        404.00 cm/s MV Peak grad:  6.2 mmHg MV Mean grad:  4.0 mmHg     SHUNTS MV Vmax:       1.25 m/s     Systemic VTI:  0.14 m MV Vmean:      94.5 cm/s    Systemic Diam: 1.70 cm MV Decel Time: 243 msec MR Peak grad: 2.3 mmHg MR Vmax:      76.00 cm/s MV E velocity: 123.00 cm/s MV A velocity: 116.00 cm/s MV E/A ratio:  1.06 Zoila Shutter MD Electronically signed by Zoila Shutter MD Signature Date/Time: 03/29/2023/1:16:01 PM    Final    CT ABDOMEN PELVIS WO CONTRAST  Result Date: 03/29/2023 CLINICAL DATA:  Acute abdominal pain.  Diarrhea.  Septic shock. EXAM: CT ABDOMEN AND PELVIS WITHOUT CONTRAST TECHNIQUE: Multidetector CT imaging of the abdomen and pelvis was performed following the standard protocol without IV contrast. RADIATION DOSE REDUCTION: This exam was performed according to the departmental dose-optimization program which includes automated exposure control, adjustment of the mA and/or kV according to patient size and/or use of iterative reconstruction technique. COMPARISON:  04/06/2015 FINDINGS: Lower chest: No acute findings. Hepatobiliary: No mass visualized on this unenhanced exam. Gallstones are seen, however there is no evidence of cholecystitis or biliary dilatation. Pancreas: No mass or inflammatory process visualized on this unenhanced exam. Spleen:  Within normal limits in size. Adrenals/Urinary tract: Enlarged kidneys are again seen bilaterally with diffuse involvement by innumerable cysts, many showing mural calcification. Renal masses cannot definitely be excluded on this unenhanced exam. No evidence of  ureteral calculi or hydronephrosis. Urinary bladder is nearly completely empty. Stomach/Bowel: Generalized gaseous distention of stomach and nondependent bowel loops is noted, without transition point. This is most consistent with ileus. No evidence of focal inflammatory process or abscess. Small amount of free fluid noted in right abdomen and pelvis, with diffuse mesenteric and body Palmer edema. Vascular/Lymphatic: No pathologically enlarged lymph nodes identified. No evidence of abdominal aortic aneurysm. Reproductive:  No mass or other significant abnormality. Other:  None. Musculoskeletal:  No suspicious bone lesions identified. IMPRESSION: Adynamic ileus pattern. No evidence of bowel obstruction or focal inflammatory process. Polycystic kidney disease. Small amount of ascites, with mild diffuse mesenteric and body Palmer edema. Cholelithiasis. No radiographic evidence of cholecystitis. Electronically Signed   By: Danae Orleans M.D.   On: 03/29/2023 09:35   DG Chest Portable 1 View  Result Date: 03/29/2023 CLINICAL DATA:  57 year old male with history of cough. EXAM: PORTABLE CHEST 1 VIEW COMPARISON:  Chest x-ray 03/19/2015. FINDINGS: Lung volumes are low. Elevation of the right hemidiaphragm, worsened compared to the prior study from 2016. No definite consolidative airspace disease. Mild diffuse interstitial prominence and peribronchial cuffing, increased compared to the remote prior examination. No pleural effusions. No pneumothorax. No evidence of pulmonary edema. Heart size is mildly enlarged. Upper mediastinal contours are within normal limits. Atherosclerotic calcifications are noted in the thoracic aorta. IMPRESSION: 1. Mild diffuse peribronchial cuffing and interstitial prominence, which may suggest an acute bronchitis, although similar findings were evident  in 2016 so some element of chronic bronchitis is suspected as well. 2. Severe elevation of the right hemidiaphragm worsened compared to the prior  study suggesting right hemidiaphragm paralysis. 3. Aortic atherosclerosis. Electronically Signed   By: Trudie Reed M.D.   On: 03/29/2023 08:18     Discharge Instructions: Discharge Instructions     Diet - low sodium heart healthy   Complete by: As directed    Discharge instructions   Complete by: As directed    Mr. Trystin Luft,  It was a pleasure taking care of you at [facility]. You were admitted for heart failure.  You will be going home on hospice. The hospice team will take great care of you.  Take care,  Dr. Modena Slater, DO  Dr. Audie Box, DO   Increase activity slowly   Complete by: As directed    No wound care   Complete by: As directed        Signed: Modena Slater, DO 25-Apr-2023, 1:02 PM   Pager: [phone]

## 2023-04-09 NOTE — Plan of Care (Signed)
  Problem: Education: Goal: Knowledge of General Education information will improve Description: Including pain rating scale, medication(s)/side effects and non-pharmacologic comfort measures Outcome: Adequate for Discharge   Problem: Health Behavior/Discharge Planning: Goal: Ability to manage health-related needs will improve Outcome: Adequate for Discharge   Problem: Clinical Measurements: Goal: Ability to maintain clinical measurements within normal limits will improve Outcome: Adequate for Discharge Goal: Will remain free from infection Outcome: Adequate for Discharge Goal: Diagnostic test results will improve Outcome: Adequate for Discharge Goal: Respiratory complications will improve Outcome: Adequate for Discharge Goal: Cardiovascular complication will be avoided Outcome: Adequate for Discharge   Problem: Activity: Goal: Risk for activity intolerance will decrease Outcome: Adequate for Discharge   Problem: Nutrition: Goal: Adequate nutrition will be maintained Outcome: Adequate for Discharge   Problem: Coping: Goal: Level of anxiety will decrease Outcome: Adequate for Discharge   Problem: Elimination: Goal: Will not experience complications related to bowel motility Outcome: Adequate for Discharge   Problem: Pain Managment: Goal: General experience of comfort will improve Outcome: Adequate for Discharge   Problem: Safety: Goal: Ability to remain free from injury will improve Outcome: Adequate for Discharge   Problem: Skin Integrity: Goal: Risk for impaired skin integrity will decrease Outcome: Adequate for Discharge   Problem: Education: Goal: Ability to describe self-care measures that may prevent or decrease complications (Diabetes Survival Skills Education) will improve Outcome: Adequate for Discharge Goal: Individualized Educational Video(s) Outcome: Adequate for Discharge   Problem: Coping: Goal: Ability to adjust to condition or change in health  will improve Outcome: Adequate for Discharge   Problem: Fluid Volume: Goal: Ability to maintain a balanced intake and output will improve Outcome: Adequate for Discharge   Problem: Health Behavior/Discharge Planning: Goal: Ability to identify and utilize available resources and services will improve Outcome: Adequate for Discharge Goal: Ability to manage health-related needs will improve Outcome: Adequate for Discharge   Problem: Metabolic: Goal: Ability to maintain appropriate glucose levels will improve Outcome: Adequate for Discharge   Problem: Nutritional: Goal: Maintenance of adequate nutrition will improve Outcome: Adequate for Discharge Goal: Progress toward achieving an optimal weight will improve Outcome: Adequate for Discharge   Problem: Skin Integrity: Goal: Risk for impaired skin integrity will decrease Outcome: Adequate for Discharge   Problem: Tissue Perfusion: Goal: Adequacy of tissue perfusion will improve Outcome: Adequate for Discharge   Problem: Education: Goal: Understanding of CV disease, CV risk reduction, and recovery process will improve Outcome: Adequate for Discharge Goal: Individualized Educational Video(s) Outcome: Adequate for Discharge   Problem: Activity: Goal: Ability to return to baseline activity level will improve Outcome: Adequate for Discharge   Problem: Cardiovascular: Goal: Ability to achieve and maintain adequate cardiovascular perfusion will improve Outcome: Adequate for Discharge Goal: Vascular access site(s) Level 0-1 will be maintained Outcome: Adequate for Discharge   Problem: Health Behavior/Discharge Planning: Goal: Ability to safely manage health-related needs after discharge will improve Outcome: Adequate for Discharge

## 2023-04-09 NOTE — Progress Notes (Signed)
eLink Physician-Brief Progress Note Patient Name: GLADE STRAUSSER DOB: 1965/07/15 MRN: [id number]   Date of Service  04/14/2023  HPI/Events of Note  Patient in a bout of emesis.  Has been tachypneic but had increased coughing after the event.  Maintaining saturations.    eICU Interventions  For now, continue observation.  Can NT suction if needed.  Family wishes to pursue home hospice but given the degree of illness, this seems unlikely.  DNR/DNI noted.     Intervention Category Minor Interventions: Clinical assessment - ordering diagnostic tests  Mirissa Lopresti 04/21/2023, 4:03 AM

## 2023-04-09 NOTE — Progress Notes (Signed)
ANTICOAGULATION CONSULT NOTE  Pharmacy Consult for Heparin Indication: ACS vs VTE, intermittent Afib episode 9/29 (hx of pAF noted in chart not on anticoag)    No Known Allergies  Patient Measurements: Height: 5\' 11"  (180.3 cm) Weight: 100.6 kg (221 lb 12.5 oz) IBW/kg (Calculated) : 75.3 Heparin Dosing Weight: 100 kg  Vital Signs: Temp: 98.1 F (36.7 C) (10/02 0732) Temp Source: Oral (10/02 0732) Pulse Rate: 77 (10/02 0745)  Labs: Recent Labs    04/07/23 0334 04/07/23 1553 04/08/23 0315 04/08/23 1546 04/30/2023 0316  HGB 9.5*  --  9.6*  9.5*  --  9.3*  HCT 26.1*  --  26.9*  26.2*  --  25.9*  PLT 142*  --  160  173  --  180  APTT 93*  --  83*  --  96*  CREATININE 1.51*   < > 1.45* 1.59* 1.56*   < > = values in this interval not displayed.    Estimated Creatinine Clearance: 64.6 mL/min (A) (by C-G formula based on SCr of 1.56 mg/dL (H)).   Assessment: 56 YOM presenting with shock. Significant cardiac history including MSSA endocarditis, mitral regurgitation, aortic stenosis. Also hx of ESRD and DVT. Presented with elevated troponins and ECHO showed moderate RV dysfunction and RV enlargement. Pharmacy consulted to dose heparin to rule out PE with potential ACS. No AC PTA. Patient now on CRRT, has also had intermittent Afib while in the ICU.   aPTT therapeutic at 96 sec on 2000 units/hr. Utilizing aPTT for monitoring as elevated tbili effects heparin levels, remains > 19.00. CBC stable, no signs of bleeding noted.   Goal of Therapy:  aPTT 66-102 sec Monitor platelets by anticoagulation protocol: Yes   Plan:  Continue heparin infusion at 2000 units/hr  Will re-check aPTT in 8 hours given upward trend, potential accumulation.  Will only monitor aPTT in this pt with elevated tbili.  Monitor CBC daily.  Monitor for s/sx of bleeding.   Thank you for involving pharmacy in the patient's care.   Estill Batten, PharmD, BCCCP  04/23/2023 7:58 AM

## 2023-04-09 NOTE — Progress Notes (Signed)
Patient was transported off unit w/ [facility]. At bedside was ex-wife, April and Son. No belongings at bedside. No signs of distress noted at this time.

## 2023-04-13 LAB — CULTURE, BLOOD (ROUTINE X 2)
Culture: NO GROWTH
Culture: NO GROWTH
Special Requests: ADEQUATE
Special Requests: ADEQUATE

## 2023-05-09 DEATH — deceased

## 2023-05-21 MED FILL — Medication: Qty: 1 | Status: AC
